# Patient Record
Sex: Male | Born: 1955
Health system: Southern US, Community
[De-identification: ages and names within clinical notes are randomized; demographics above are authoritative.]

## PROBLEM LIST (undated history)

## (undated) DIAGNOSIS — N400 Enlarged prostate without lower urinary tract symptoms: Secondary | ICD-10-CM

## (undated) DIAGNOSIS — Z9289 Personal history of other medical treatment: Secondary | ICD-10-CM

## (undated) DIAGNOSIS — K219 Gastro-esophageal reflux disease without esophagitis: Secondary | ICD-10-CM

## (undated) DIAGNOSIS — K760 Fatty (change of) liver, not elsewhere classified: Secondary | ICD-10-CM

## (undated) DIAGNOSIS — Z8719 Personal history of other diseases of the digestive system: Secondary | ICD-10-CM

## (undated) DIAGNOSIS — N201 Calculus of ureter: Secondary | ICD-10-CM

## (undated) DIAGNOSIS — K754 Autoimmune hepatitis: Secondary | ICD-10-CM

## (undated) DIAGNOSIS — D84821 Immunodeficiency due to drugs: Secondary | ICD-10-CM

## (undated) DIAGNOSIS — K31A0 Gastric intestinal metaplasia, unspecified: Secondary | ICD-10-CM

## (undated) DIAGNOSIS — D6869 Other thrombophilia: Secondary | ICD-10-CM

## (undated) DIAGNOSIS — I10 Essential (primary) hypertension: Secondary | ICD-10-CM

## (undated) DIAGNOSIS — G8194 Hemiplegia, unspecified affecting left nondominant side: Secondary | ICD-10-CM

## (undated) DIAGNOSIS — I509 Heart failure, unspecified: Secondary | ICD-10-CM

## (undated) DIAGNOSIS — G4730 Sleep apnea, unspecified: Secondary | ICD-10-CM

## (undated) DIAGNOSIS — K55069 Acute infarction of intestine, part and extent unspecified: Secondary | ICD-10-CM

## (undated) DIAGNOSIS — R42 Dizziness and giddiness: Secondary | ICD-10-CM

## (undated) DIAGNOSIS — N289 Disorder of kidney and ureter, unspecified: Secondary | ICD-10-CM

## (undated) DIAGNOSIS — N1831 Chronic kidney disease, stage 3a: Secondary | ICD-10-CM

## (undated) DIAGNOSIS — I5189 Other ill-defined heart diseases: Secondary | ICD-10-CM

## (undated) DIAGNOSIS — I5032 Chronic diastolic (congestive) heart failure: Secondary | ICD-10-CM

## (undated) DIAGNOSIS — I110 Hypertensive heart disease with heart failure: Secondary | ICD-10-CM

## (undated) DIAGNOSIS — I639 Cerebral infarction, unspecified: Secondary | ICD-10-CM

## (undated) DIAGNOSIS — Z7984 Long term (current) use of oral hypoglycemic drugs: Secondary | ICD-10-CM

## (undated) DIAGNOSIS — R7689 Other specified abnormal immunological findings in serum: Secondary | ICD-10-CM

## (undated) DIAGNOSIS — Z87442 Personal history of urinary calculi: Secondary | ICD-10-CM

## (undated) DIAGNOSIS — R55 Syncope and collapse: Secondary | ICD-10-CM

## (undated) DIAGNOSIS — E669 Obesity, unspecified: Secondary | ICD-10-CM

## (undated) DIAGNOSIS — R0602 Shortness of breath: Secondary | ICD-10-CM

## (undated) DIAGNOSIS — Z86718 Personal history of other venous thrombosis and embolism: Secondary | ICD-10-CM

## (undated) DIAGNOSIS — M1990 Unspecified osteoarthritis, unspecified site: Secondary | ICD-10-CM

## (undated) HISTORY — PX: LIVER BIOPSY: SHX301

## (undated) HISTORY — PX: LUMBAR FUSION: SHX111

## (undated) HISTORY — PX: OTHER SURGICAL HISTORY: SHX169

## (undated) HISTORY — DX: Benign prostatic hyperplasia without lower urinary tract symptoms: N40.0

## (undated) HISTORY — PX: APPENDECTOMY: SHX54

## (undated) HISTORY — PX: LUMBAR DISC SURGERY: SHX700

## (undated) HISTORY — DX: Gastro-esophageal reflux disease without esophagitis: K21.9

## (undated) HISTORY — DX: Essential (primary) hypertension: I10

---

## 2005-06-17 HISTORY — PX: CHOLECYSTECTOMY: SHX55

## 2005-06-25 ENCOUNTER — Ambulatory Visit: Payer: Self-pay | Admitting: Internal Medicine

## 2005-07-22 ENCOUNTER — Encounter (INDEPENDENT_AMBULATORY_CARE_PROVIDER_SITE_OTHER): Payer: Self-pay | Admitting: *Deleted

## 2005-07-22 ENCOUNTER — Ambulatory Visit: Payer: Self-pay | Admitting: Internal Medicine

## 2005-07-22 LAB — CONVERTED CEMR LAB
Blood Glucose, Fasting: 101 mg/dL
TSH: 2.89 microintl units/mL
WBC, blood: 4.1 10*3/uL

## 2005-07-24 ENCOUNTER — Ambulatory Visit: Payer: Self-pay | Admitting: Internal Medicine

## 2005-07-25 ENCOUNTER — Ambulatory Visit: Payer: Self-pay | Admitting: Internal Medicine

## 2005-07-31 ENCOUNTER — Ambulatory Visit: Payer: Self-pay | Admitting: Internal Medicine

## 2005-08-02 ENCOUNTER — Ambulatory Visit: Payer: Self-pay

## 2005-08-22 ENCOUNTER — Ambulatory Visit: Payer: Self-pay | Admitting: Internal Medicine

## 2005-09-29 ENCOUNTER — Emergency Department: Payer: Self-pay | Admitting: Emergency Medicine

## 2005-09-29 ENCOUNTER — Other Ambulatory Visit: Payer: Self-pay

## 2005-10-10 ENCOUNTER — Ambulatory Visit: Payer: Self-pay | Admitting: General Surgery

## 2005-10-11 ENCOUNTER — Emergency Department: Payer: Self-pay | Admitting: General Surgery

## 2006-03-17 ENCOUNTER — Ambulatory Visit: Payer: Self-pay | Admitting: Internal Medicine

## 2006-03-18 ENCOUNTER — Encounter (INDEPENDENT_AMBULATORY_CARE_PROVIDER_SITE_OTHER): Payer: Self-pay | Admitting: *Deleted

## 2006-04-09 ENCOUNTER — Ambulatory Visit: Payer: Self-pay | Admitting: Gastroenterology

## 2006-04-25 ENCOUNTER — Ambulatory Visit: Payer: Self-pay | Admitting: Gastroenterology

## 2006-06-04 ENCOUNTER — Ambulatory Visit: Payer: Self-pay | Admitting: Gastroenterology

## 2006-06-17 HISTORY — PX: HERNIA REPAIR: SHX51

## 2006-07-01 ENCOUNTER — Ambulatory Visit: Payer: Self-pay | Admitting: Urology

## 2006-09-01 ENCOUNTER — Inpatient Hospital Stay: Payer: Self-pay | Admitting: Internal Medicine

## 2006-09-01 ENCOUNTER — Other Ambulatory Visit: Payer: Self-pay

## 2006-09-11 ENCOUNTER — Ambulatory Visit: Payer: Self-pay | Admitting: Internal Medicine

## 2006-09-16 ENCOUNTER — Ambulatory Visit: Payer: Self-pay | Admitting: Internal Medicine

## 2006-10-16 ENCOUNTER — Ambulatory Visit: Payer: Self-pay | Admitting: Internal Medicine

## 2006-10-21 ENCOUNTER — Encounter: Payer: Self-pay | Admitting: Internal Medicine

## 2006-11-12 ENCOUNTER — Telehealth: Payer: Self-pay | Admitting: Internal Medicine

## 2006-11-12 ENCOUNTER — Encounter: Payer: Self-pay | Admitting: Internal Medicine

## 2006-11-13 ENCOUNTER — Encounter (INDEPENDENT_AMBULATORY_CARE_PROVIDER_SITE_OTHER): Payer: Self-pay | Admitting: *Deleted

## 2006-11-16 ENCOUNTER — Ambulatory Visit: Payer: Self-pay | Admitting: Internal Medicine

## 2006-11-24 ENCOUNTER — Encounter: Payer: Self-pay | Admitting: Internal Medicine

## 2006-12-16 ENCOUNTER — Ambulatory Visit: Payer: Self-pay | Admitting: Internal Medicine

## 2007-01-01 ENCOUNTER — Encounter: Payer: Self-pay | Admitting: Internal Medicine

## 2007-01-16 ENCOUNTER — Ambulatory Visit: Payer: Self-pay | Admitting: Internal Medicine

## 2007-01-21 ENCOUNTER — Inpatient Hospital Stay: Payer: Self-pay | Admitting: *Deleted

## 2007-01-21 ENCOUNTER — Other Ambulatory Visit: Payer: Self-pay

## 2007-02-03 ENCOUNTER — Ambulatory Visit: Payer: Self-pay | Admitting: Internal Medicine

## 2007-02-03 ENCOUNTER — Encounter: Payer: Self-pay | Admitting: Internal Medicine

## 2007-02-16 ENCOUNTER — Ambulatory Visit: Payer: Self-pay | Admitting: Internal Medicine

## 2007-03-18 ENCOUNTER — Ambulatory Visit: Payer: Self-pay | Admitting: Internal Medicine

## 2007-03-26 ENCOUNTER — Inpatient Hospital Stay: Payer: Self-pay | Admitting: Internal Medicine

## 2007-03-30 ENCOUNTER — Encounter: Payer: Self-pay | Admitting: Internal Medicine

## 2007-03-31 ENCOUNTER — Encounter: Payer: Self-pay | Admitting: Internal Medicine

## 2007-04-07 ENCOUNTER — Encounter (INDEPENDENT_AMBULATORY_CARE_PROVIDER_SITE_OTHER): Payer: Self-pay | Admitting: *Deleted

## 2007-04-10 ENCOUNTER — Encounter: Payer: Self-pay | Admitting: Internal Medicine

## 2007-04-18 ENCOUNTER — Ambulatory Visit: Payer: Self-pay | Admitting: Internal Medicine

## 2007-05-22 ENCOUNTER — Encounter: Payer: Self-pay | Admitting: *Deleted

## 2007-05-22 DIAGNOSIS — K7460 Unspecified cirrhosis of liver: Secondary | ICD-10-CM | POA: Insufficient documentation

## 2007-05-22 DIAGNOSIS — Z87442 Personal history of urinary calculi: Secondary | ICD-10-CM

## 2007-05-22 DIAGNOSIS — J309 Allergic rhinitis, unspecified: Secondary | ICD-10-CM | POA: Insufficient documentation

## 2007-05-22 DIAGNOSIS — K219 Gastro-esophageal reflux disease without esophagitis: Secondary | ICD-10-CM | POA: Insufficient documentation

## 2007-05-22 DIAGNOSIS — Z8719 Personal history of other diseases of the digestive system: Secondary | ICD-10-CM

## 2007-05-22 DIAGNOSIS — K449 Diaphragmatic hernia without obstruction or gangrene: Secondary | ICD-10-CM | POA: Insufficient documentation

## 2007-05-22 DIAGNOSIS — K5180 Other ulcerative colitis without complications: Secondary | ICD-10-CM

## 2007-05-29 ENCOUNTER — Emergency Department: Payer: Self-pay | Admitting: Internal Medicine

## 2007-06-02 ENCOUNTER — Telehealth: Payer: Self-pay | Admitting: Internal Medicine

## 2007-06-02 ENCOUNTER — Encounter: Payer: Self-pay | Admitting: Internal Medicine

## 2007-06-18 ENCOUNTER — Ambulatory Visit: Payer: Self-pay | Admitting: Internal Medicine

## 2007-06-18 DIAGNOSIS — Z944 Liver transplant status: Secondary | ICD-10-CM

## 2007-06-18 HISTORY — DX: Liver transplant status: Z94.4

## 2007-07-10 ENCOUNTER — Encounter: Payer: Self-pay | Admitting: Internal Medicine

## 2007-07-15 ENCOUNTER — Ambulatory Visit: Payer: Self-pay | Admitting: Internal Medicine

## 2007-07-19 ENCOUNTER — Ambulatory Visit: Payer: Self-pay | Admitting: Internal Medicine

## 2007-08-16 ENCOUNTER — Ambulatory Visit: Payer: Self-pay | Admitting: Internal Medicine

## 2007-08-25 ENCOUNTER — Ambulatory Visit: Payer: Self-pay | Admitting: Internal Medicine

## 2007-08-27 ENCOUNTER — Encounter: Payer: Self-pay | Admitting: Internal Medicine

## 2007-09-16 ENCOUNTER — Ambulatory Visit: Payer: Self-pay | Admitting: Internal Medicine

## 2007-11-10 ENCOUNTER — Ambulatory Visit: Payer: Self-pay | Admitting: Internal Medicine

## 2007-11-13 ENCOUNTER — Ambulatory Visit: Payer: Self-pay | Admitting: Internal Medicine

## 2007-11-19 ENCOUNTER — Encounter: Payer: Self-pay | Admitting: Internal Medicine

## 2007-11-24 HISTORY — PX: LIVER TRANSPLANT: SHX410

## 2008-01-16 ENCOUNTER — Ambulatory Visit: Payer: Self-pay | Admitting: Internal Medicine

## 2008-05-02 ENCOUNTER — Encounter: Payer: Self-pay | Admitting: Internal Medicine

## 2008-08-08 ENCOUNTER — Emergency Department (HOSPITAL_COMMUNITY): Admission: EM | Admit: 2008-08-08 | Discharge: 2008-08-08 | Payer: Self-pay | Admitting: Emergency Medicine

## 2008-08-16 ENCOUNTER — Emergency Department (HOSPITAL_COMMUNITY): Admission: EM | Admit: 2008-08-16 | Discharge: 2008-08-17 | Payer: Self-pay | Admitting: Emergency Medicine

## 2008-09-19 ENCOUNTER — Encounter: Payer: Self-pay | Admitting: Internal Medicine

## 2008-10-07 ENCOUNTER — Encounter: Payer: Self-pay | Admitting: Internal Medicine

## 2008-10-11 ENCOUNTER — Encounter
Admission: RE | Admit: 2008-10-11 | Discharge: 2008-10-11 | Payer: Self-pay | Admitting: Physical Medicine and Rehabilitation

## 2008-10-21 ENCOUNTER — Encounter: Payer: Self-pay | Admitting: Internal Medicine

## 2008-11-18 ENCOUNTER — Ambulatory Visit: Payer: Self-pay | Admitting: Family Medicine

## 2008-11-21 ENCOUNTER — Encounter: Payer: Self-pay | Admitting: Internal Medicine

## 2008-12-19 ENCOUNTER — Telehealth: Payer: Self-pay | Admitting: Family Medicine

## 2008-12-21 ENCOUNTER — Ambulatory Visit: Payer: Self-pay | Admitting: Internal Medicine

## 2008-12-21 DIAGNOSIS — M109 Gout, unspecified: Secondary | ICD-10-CM | POA: Insufficient documentation

## 2008-12-21 DIAGNOSIS — M1A9XX1 Chronic gout, unspecified, with tophus (tophi): Secondary | ICD-10-CM | POA: Insufficient documentation

## 2008-12-21 DIAGNOSIS — N400 Enlarged prostate without lower urinary tract symptoms: Secondary | ICD-10-CM | POA: Insufficient documentation

## 2008-12-22 LAB — CONVERTED CEMR LAB
Albumin: 4 g/dL (ref 3.5–5.2)
CO2: 31 meq/L (ref 19–32)
Calcium: 9.3 mg/dL (ref 8.4–10.5)
Creatinine, Ser: 1.3 mg/dL (ref 0.4–1.5)
Glucose, Bld: 105 mg/dL — ABNORMAL HIGH (ref 70–99)
Sodium: 143 meq/L (ref 135–145)

## 2008-12-26 ENCOUNTER — Encounter: Payer: Self-pay | Admitting: Internal Medicine

## 2009-02-13 ENCOUNTER — Encounter: Payer: Self-pay | Admitting: Internal Medicine

## 2009-03-14 ENCOUNTER — Encounter: Payer: Self-pay | Admitting: Internal Medicine

## 2009-11-20 ENCOUNTER — Encounter: Payer: Self-pay | Admitting: Internal Medicine

## 2010-02-12 ENCOUNTER — Ambulatory Visit: Payer: Self-pay | Admitting: Family Medicine

## 2010-03-30 ENCOUNTER — Ambulatory Visit: Payer: Self-pay | Admitting: Internal Medicine

## 2010-03-31 LAB — CONVERTED CEMR LAB
AST: 21 units/L (ref 0–37)
Alkaline Phosphatase: 60 units/L (ref 39–117)
BUN: 15 mg/dL (ref 6–23)
Basophils Relative: 0 % (ref 0–1)
Creatinine, Ser: 1.34 mg/dL (ref 0.40–1.50)
Eosinophils Absolute: 0.2 10*3/uL (ref 0.0–0.7)
Eosinophils Relative: 2 % (ref 0–5)
Glucose, Bld: 93 mg/dL (ref 70–99)
HCT: 50.4 % (ref 39.0–52.0)
Lymphs Abs: 1.8 10*3/uL (ref 0.7–4.0)
MCHC: 35.3 g/dL (ref 30.0–36.0)
MCV: 91.5 fL (ref 78.0–100.0)
Monocytes Relative: 14 % — ABNORMAL HIGH (ref 3–12)
Neutrophils Relative %: 54 % (ref 43–77)
RBC: 5.51 M/uL (ref 4.22–5.81)
Total Bilirubin: 2.3 mg/dL — ABNORMAL HIGH (ref 0.3–1.2)
WBC: 6.2 10*3/uL (ref 4.0–10.5)

## 2010-04-17 ENCOUNTER — Ambulatory Visit: Payer: Self-pay | Admitting: Internal Medicine

## 2010-05-21 ENCOUNTER — Encounter: Payer: Self-pay | Admitting: Internal Medicine

## 2010-05-25 ENCOUNTER — Ambulatory Visit: Payer: Self-pay | Admitting: Internal Medicine

## 2010-05-25 DIAGNOSIS — R350 Frequency of micturition: Secondary | ICD-10-CM

## 2010-05-25 LAB — CONVERTED CEMR LAB
Bilirubin Urine: NEGATIVE
Blood in Urine, dipstick: NEGATIVE
Ketones, urine, test strip: NEGATIVE
Nitrite: NEGATIVE
Rapid Strep: NEGATIVE
Urobilinogen, UA: 0.2

## 2010-07-17 NOTE — Assessment & Plan Note (Signed)
Summary: NOT FEELING WELL   Vital Signs:  Patient profile:   55 year old male Weight:      248 pounds O2 Sat:      96 % on Room air Temp:     98.1 degrees F oral Pulse rate:   76 / minute Pulse rhythm:   regular BP sitting:   140 / 90  (left arm) Cuff size:   large  Vitals Entered By: Mervin Hack CMA Duncan Dull) (March 30, 2010 3:35 PM)  O2 Flow:  Room air CC: abdominal pain   History of Present Illness: Gout attack did resolve not problems with the colcyrs  Has not been feeling right for several days had stomach virus at the time of his last appt  ~5-6 weeks ago Has some swelling in abdomen gets nausea and cold sweats at times (gets hot flash then) Does have feeling of pain in RUQ and LUQ reminds him of before his transplant  bloated feeling appetite is off tried laxative--worked with bowels but still has sensation More laxative   Allergies: 1)  ! Codeine 2)  ! Morphine 3)  ! Hydrocodone  Past History:  Past medical, surgical, family and social histories (including risk factors) reviewed for relevance to current acute and chronic problems.  Past Medical History: Reviewed history from 12/21/2008 and no changes required. Allergic rhinitis:(1991) GUYQ:(0347) QQVZ:(5638) Cirrhosis Benign prostatic hypertrophy  Past Surgical History: Reviewed history from 12/21/2008 and no changes required. SURGERY TO REMOVE FISTULA:(1984) DISC REMOVED LOWER VFIE:(3329) FUSION LOWER BACK PAST AA:(1988) HOSP. WITH INFECTION INSTESTINES:(2005) NEGATIVE ADENOSINE MYOVIEW EF 67%:(07/2005) LAB CHOLE. (DR. SANKAR):(09/2005) LIVER BIOPSY ERCP NL:(04/2006) R/H (DR.COPE):(06/2006) [facility] ELEVATED PAIN ,FEVERS X 2 WEEKS SIRS:(08/2005) BONE MARROW BX. REP?:(09/2006) LIVER TRANSPLANT--2009  Family History: Reviewed history from 05/22/2007 and no changes required. Father: ALIVE 18 YOA HISTORY OF COLON CANCER // 4 YEARS AGO Mother: ALIVE 69 YOA : RARE BLOOD DISEASE , DIALYSIS X 5  YEARS , FX. HIP GRANDPARENTS: GF 60'S BOTH : ONE WITH CANCER// THE OTHER MI MGM 80'S// GM/PGM 80'S--FELL BROKE HIP Siblings: 1 BROTHER 53YOA GOOD HEALTH// 1 BROTHER 56 YOA GOOD HEALTH EXCEPT FOR ARTHRITIS 1 SISTER 48 YOA GOOD HEALTH CV: + GRANDPARENTS HBP:+ MOTHER DM:+MOMS SISTER, DADS AUNT ARTHRITIS: PARENTS, G'PARENTS PROSTATECANCER : NEGATIVE BREAST/OVATIAN/UTERINECANCER: NEGATIVE COLON CANCER: +FATHER DEPRESSION: NEGATIVE ETOH/DRUG ABUSE: 1 AUNT/ 3 UNCLES DIED FROM ETOH( MOMS FAMILY) KIDNEY DISEASE: PARENTS DIALYSIS VASCULITIS: MOTHER  Social History: Reviewed history from 05/22/2007 and no changes required. Marital Status: Married LIVES WITH WIFE Children: 3 CHILDREN /5 GRANDCHILDREN Occupation: DISABLED  Review of Systems       No vomiting No cognitive problems No excessive bruising No cough but does get sense of SOB from abdomen pushing against the diaphragm  Physical Exam  General:  alert and normal appearance.   Neck:  supple, no masses, no thyromegaly, no carotid bruits, and no cervical lymphadenopathy.   Lungs:  normal respiratory effort, no intercostal retractions, no accessory muscle use, and normal breath sounds.   Heart:  normal rate, regular rhythm, no murmur, and no gallop.   Abdomen:  soft and normal bowel sounds.   Mild epigastric and RUQ tenderness No apparent ascites Extremities:  no edema Skin:  No petechiae or jaundice Psych:  normally interactive, good eye contact, not anxious appearing, and not depressed appearing.     Impression & Recommendations:  Problem # 1:  NAUSEA (ICD-787.02) Assessment New  has some tenderness over the area of his transplant liver no evidence of ascites  or other stigmata of liver failure clear mentation  P: will check labs    needs recheck at transplant center at [facility] as soon as possible     if liver tests elevated, will have him present to ER at [facility]  Orders: Venipuncture (54627) Specimen Handling  (99000) T-Comprehensive Metabolic Panel (03500-93818) T-CBC w/Diff (29937-16967)  Complete Medication List: 1)  Omeprazole 20 Mg Cpdr (Omeprazole) .Marland Kitchen.. 1 tab two times a day 2)  Flomax 0.4 Mg Xr24h-cap (Tamsulosin hcl) .... Once a day 3)  Colcrys 0.6 Mg Tabs (Colchicine) .... Take 1 by mouth once daily as needed for gout 4)  Aspirin 81 Mg Tabs (Aspirin) .... Take 1 by mouth once daily 5)  Prograf 1 Mg Caps (Tacrolimus) .... Take 1 by mouth three times a day  Patient Instructions: 1)  Please set up follow up at [facility] with your transplant team --should be by sometime next week  Current Allergies (reviewed today): ! CODEINE ! MORPHINE ! HYDROCODONE

## 2010-07-17 NOTE — Assessment & Plan Note (Signed)
Summary: FLU SHOT/DLO  Nurse Visit   Allergies: 1)  ! Codeine 2)  ! Morphine 3)  ! Hydrocodone  Orders Added: 1)  Flu Vaccine 66yrs + MEDICARE PATIENTS [Q2039] 2)  Administration Flu vaccine - MCR [G0008]  Flu Vaccine Consent Questions     Do you have a history of severe allergic reactions to this vaccine? no    Any prior history of allergic reactions to egg and/or gelatin? no    Do you have a sensitivity to the preservative Thimersol? no    Do you have a past history of Guillan-Barre Syndrome? no    Do you currently have an acute febrile illness? no    Have you ever had a severe reaction to latex? no    Vaccine information given and explained to patient? yes    Are you currently pregnant? no    Lot Number:[id number]   Exp Date:12/15/2010   Site Given  Left Deltoid IM1

## 2010-07-17 NOTE — Assessment & Plan Note (Signed)
Summary: ?GOUT/CLE   Vital Signs:  Patient profile:   55 year old male Height:      73.25 inches Weight:      245.25 pounds BMI:     32.25 Temp:     97.8 degrees F oral Pulse rate:   80 / minute Pulse rhythm:   regular BP sitting:   122 / 90  (left arm) Cuff size:   large  Vitals Entered By: Delilah Shan CMA  Dull) (February 12, 2010 2:00 PM) CC: ? gout.  Patient states he has been out of his Flomax and Omeprazole   History of Present Illness: H/o liver transplant and now with twice yearly eval at [facility].  Last check was in June 2011.   Gout- episode started late Thursday and early Friday.  L ankle and 1st MTP pain.  Has had some diarrhea in the meantime.  No fevers.  Minimal abdominal pain that had been episodic.  Not on colchine.  No new foods.  H/o gout intermittently for  ~10 years.  Off prednisone now.    Inc in heartburn after stopping PPI.  Allergies: 1)  ! Codeine 2)  ! Morphine 3)  ! Hydrocodone  Past History:  Past Medical History: Last updated: 12/21/2008 Allergic rhinitis:(1991) ZOXW:(9604) VWUJ:(8119) Cirrhosis Benign prostatic hypertrophy  Review of Systems       See HPI.  Otherwise negative.    Physical Exam  General:  GEN: nad, alert and oriented HEENT: mucous membranes moist NECK: supple w/o LA CV: rrr PULM: ctab, no inc wob ABD: soft, +bs, not tender to palpation  EXT: no edema SKIN: no acute rash but with erythema, edema and tenderness at L 1st MTP and medial mal.     Impression & Recommendations:  Problem # 1:  GOUT (ICD-274.9) I would restart colchicine and have patient follow up with primary re: longterm tx.  He can follow up with primary re:BP when he is not in pain.  He agrees.  I wouldn't start suppressive tx in the midst of a flare.  This was d/w patient.  I d/w patient JY:NWGNF to avoid.  I am unclear about the source of the diarrhea, but the patient has a benign exam.  He is not dehydrated by exam and he has no fever.  I would  observe this in the meantime. He may have a benign and self limitied viral process.  He is okay for outpatient follow up.  The following medications were removed from the medication list:    Colchicine 0.6 Mg Tabs (Colchicine) .Marland Kitchen... 1 tab by mouth daily or two times a day to prevent gout His updated medication list for this problem includes:    Colcrys 0.6 Mg Tabs (Colchicine) .Marland Kitchen... 1 by mouth qday as needed for gout  Complete Medication List: 1)  Omeprazole 20 Mg Cpdr (Omeprazole) .Marland Kitchen.. 1 tab two times a day 2)  Flomax 0.4 Mg Xr24h-cap (Tamsulosin hcl) .... Once a day 3)  Baby Aspirin 81 Mg Chew (Aspirin) .... Once a aday 4)  Prograft  5)  Colcrys 0.6 Mg Tabs (Colchicine) .Marland Kitchen.. 1 by mouth qday as needed for gout  Patient Instructions: 1)  Start back on the colchicine once daily and let us know if you have any fevers.  I would drink plenty of fluid in the meantime.  2)  I want you to follow up with Dr. Alphonsus Sias to talk about your gout and blood pressure.  Prescriptions: COLCRYS 0.6 MG TABS (COLCHICINE) 1 by mouth qday as  needed for gout  #30 x 3   Entered and Authorized by:   Crawford Givens MD   Signed by:   Crawford Givens MD on 02/12/2010   Method used:   Electronically to        [facility]* (retail)       [address]       [place], [place]  [address]       Ph: [phone]       Fax: [phone]   RxID:   [id number] FLOMAX 0.4 MG XR24H-CAP (TAMSULOSIN HCL) once a day  #30 x 12   Entered and Authorized by:   Crawford Givens MD   Signed by:   Crawford Givens MD on 02/12/2010   Method used:   Electronically to        [facility]* (retail)       [address]       [place], [place]  [address]       Ph: [phone]       Fax: [phone]   RxID:   [id number] OMEPRAZOLE 20 MG  CPDR (OMEPRAZOLE) 1 tab two times a day  #60 x 12   Entered and Authorized by:   Crawford Givens MD   Signed by:   Crawford Givens MD on 02/12/2010   Method used:   Electronically to        [facility]* (retail)       [address]       [place], [place]  [address]       Ph: [phone]       Fax: [phone]   RxID:   [id number]     Current Allergies (reviewed today): ! CODEINE ! MORPHINE ! HYDROCODONE

## 2010-07-17 NOTE — Letter (Signed)
Summary: [facility]  [facility]   Imported By: Maryln Gottron 12/08/2009 15:54:59  _____________________________________________________________________  External Attachment:    Type:   Image     Comment:   External Document  Appended Document: [facility] immunosupression fine on tacrolimus after liver transplant

## 2010-07-17 NOTE — Assessment & Plan Note (Signed)
Summary: THROAT,BODY ACHES/CLE   Vital Signs:  Patient profile:   55 year old male Weight:      249 pounds O2 Sat:      97 % on Room air Temp:     98.2 degrees F oral Pulse rate:   84 / minute Pulse rhythm:   regular Resp:     14 per minute BP sitting:   100 / 70  (left arm) Cuff size:   large  Vitals Entered By: Mervin Hack CMA (AAMA) (May 25, 2010 12:30 PM)  O2 Flow:  Room air CC: body aching, sinus, cough   History of Present Illness: Feeling fine till 3 days ago Started feeling weak Then throat dry and scratchy  esp since 2 days ago Feels "run down" no fever  Slight cough--mostly dry but occ dark phlegm Occ SOB since sick feels achy SLight soreness inside left ear a few days ago  Had his transplant follow up at [facility] on Monday--??some ill exposure then  Increased urinary freq slight dysuria No hematuria Slight LLQ abd soreness---relates to the cough  Allergies: 1)  ! Codeine 2)  ! Morphine 3)  ! Hydrocodone  Past History:  Past medical, surgical, family and social histories (including risk factors) reviewed for relevance to current acute and chronic problems.  Past Medical History: Reviewed history from 12/21/2008 and no changes required. Allergic rhinitis:(1991) VWUJ:(8119) JYNW:(2956) Cirrhosis Benign prostatic hypertrophy  Past Surgical History: Reviewed history from 12/21/2008 and no changes required. SURGERY TO REMOVE FISTULA:(1984) DISC REMOVED LOWER OZHY:(8657) FUSION LOWER BACK PAST AA:(1988) HOSP. WITH INFECTION INSTESTINES:(2005) NEGATIVE ADENOSINE MYOVIEW EF 67%:(07/2005) LAB CHOLE. (DR. SANKAR):(09/2005) LIVER BIOPSY ERCP NL:(04/2006) R/H (DR.COPE):(06/2006) [facility] ELEVATED PAIN ,FEVERS X 2 WEEKS SIRS:(08/2005) BONE MARROW BX. REP?:(09/2006) LIVER TRANSPLANT--2009  Family History: Reviewed history from 05/22/2007 and no changes required. Father: ALIVE 62 YOA HISTORY OF COLON CANCER // 4 YEARS AGO Mother: ALIVE 64 YOA :  RARE BLOOD DISEASE , DIALYSIS X 5 YEARS , FX. HIP GRANDPARENTS: GF 60'S BOTH : ONE WITH CANCER// THE OTHER MI MGM 80'S// GM/PGM 80'S--FELL BROKE HIP Siblings: 1 BROTHER 53YOA GOOD HEALTH// 1 BROTHER 56 YOA GOOD HEALTH EXCEPT FOR ARTHRITIS 1 SISTER 48 YOA GOOD HEALTH CV: + GRANDPARENTS HBP:+ MOTHER DM:+MOMS SISTER, DADS AUNT ARTHRITIS: PARENTS, G'PARENTS PROSTATECANCER : NEGATIVE BREAST/OVATIAN/UTERINECANCER: NEGATIVE COLON CANCER: +FATHER DEPRESSION: NEGATIVE ETOH/DRUG ABUSE: 1 AUNT/ 3 UNCLES DIED FROM ETOH( MOMS FAMILY) KIDNEY DISEASE: PARENTS DIALYSIS VASCULITIS: MOTHER  Social History: Reviewed history from 05/22/2007 and no changes required. Marital Status: Married LIVES WITH WIFE Children: 3 CHILDREN /5 GRANDCHILDREN Occupation: DISABLED  Review of Systems       No vomiting ONe loose stool yesterday (but no increase in numbers)   Physical Exam  General:  alert.  NAD Head:  no sinus tenderness Ears:  R ear normal and L ear normal.   Nose:  mild nasal congestion Mouth:  slight pharyngeal injection without exudates no other lesions Neck:  supple, no masses, and no cervical lymphadenopathy.   Lungs:  normal respiratory effort, no intercostal retractions, no accessory muscle use, normal breath sounds, no crackles, and no wheezes.   Abdomen:  soft, non-tender, and no masses.     Impression & Recommendations:  Problem # 1:  PHARYNGITIS-ACUTE (ICD-462) Assessment New  seems to be viral is on immunosuppressants, but no evidence bacterial infection discussed supportive care with tylenol recheck if persists or worsens  His updated medication list for this problem includes:    Aspirin 81 Mg Tabs (  Aspirin) .Marland Kitchen... Take 1 by mouth once daily  Orders: Rapid Strep (29528)  Problem # 2:  FREQUENCY, URINARY (ICD-788.41) Assessment: New  no evidence of UTI observe only  His updated medication list for this problem includes:    Flomax 0.4 Mg Xr24h-cap (Tamsulosin hcl)  ..... Once a day  Orders: UA Dipstick w/o Micro (manual) (41324)  Complete Medication List: 1)  Omeprazole 20 Mg Cpdr (Omeprazole) .Marland Kitchen.. 1 tab two times a day 2)  Flomax 0.4 Mg Xr24h-cap (Tamsulosin hcl) .... Once a day 3)  Colcrys 0.6 Mg Tabs (Colchicine) .... Take 1 by mouth once daily as needed for gout 4)  Aspirin 81 Mg Tabs (Aspirin) .... Take 1 by mouth once daily 5)  Prograf 1 Mg Caps (Tacrolimus) .... Take 1 by mouth three times a day  Patient Instructions: 1)  Please schedule a follow-up appointment as needed .  Call if you worsen or it persists for over 10-14 days   Orders Added: 1)  Est. Patient Level IV [40102] 2)  UA Dipstick w/o Micro (manual) [81002] 3)  Rapid Strep [72536]    Current Allergies (reviewed today): ! CODEINE ! MORPHINE ! HYDROCODONE  Laboratory Results   Urine Tests  Date/Time Received: May 25, 2010 1:04 PM   Routine Urinalysis   Color: yellow Appearance: Clear Glucose: negative   (Normal Range: Negative) Bilirubin: negative   (Normal Range: Negative) Ketone: negative   (Normal Range: Negative) Spec. Gravity: 1.020   (Normal Range: 1.003-1.035) Blood: negative   (Normal Range: Negative) pH: 6.0   (Normal Range: 5.0-8.0) Protein: trace   (Normal Range: Negative) Urobilinogen: 0.2   (Normal Range: 0-1) Nitrite: negative   (Normal Range: Negative) Leukocyte Esterace: negative   (Normal Range: Negative)    Date/Time Received: May 25, 2010 1:05 PM   Other Tests  Rapid Strep: negative

## 2010-07-19 NOTE — Letter (Signed)
Summary: [facility]  [facility]   Imported By: Lanelle Bal 06/06/2010 08:23:08  _____________________________________________________________________  External Attachment:    Type:   Image     Comment:   External Document  Appended Document: [facility] still doing well after liver transplant

## 2010-09-20 ENCOUNTER — Encounter: Payer: Self-pay | Admitting: *Deleted

## 2010-09-24 ENCOUNTER — Ambulatory Visit (INDEPENDENT_AMBULATORY_CARE_PROVIDER_SITE_OTHER): Payer: Medicare Other | Admitting: Family Medicine

## 2010-09-24 ENCOUNTER — Encounter: Payer: Self-pay | Admitting: Family Medicine

## 2010-09-24 VITALS — BP 130/84 | HR 76 | Temp 98.0°F | Ht 72.0 in | Wt 247.0 lb

## 2010-09-24 DIAGNOSIS — K7210 Chronic hepatic failure without coma: Secondary | ICD-10-CM | POA: Insufficient documentation

## 2010-09-24 DIAGNOSIS — M109 Gout, unspecified: Secondary | ICD-10-CM

## 2010-09-24 DIAGNOSIS — K769 Liver disease, unspecified: Secondary | ICD-10-CM

## 2010-09-24 MED ORDER — PREDNISONE 10 MG PO TABS: ORAL_TABLET | ORAL | Status: DC

## 2010-09-24 NOTE — Patient Instructions (Signed)
Colcrys (colchicine) pills: 1 tablet every hour for 2 hours, then 1 tablet by mouth every 2 hours for the rest of the day today. THEN TAKE 1 TABLET EVERY DAY FROM NOW On

## 2010-09-24 NOTE — Progress Notes (Signed)
55 year old male:  Gout off and on for years. (10 years)  Pleasant male with a history of liver transplant done in 2009 at [facility] status post incomplete rejection, currently on Prograf, and followed by the [facility] transplant service.  He is here with left-sided first second and third MTP pain. He has had some swelling and some occasional warmth in these joints. He has had a ten-year history of gout. Currently he is taking colchicine, but is not been completely compliant with his therapy. He has been taking it more or less for the last 2 months without symptomatic relief of his symptoms.  Distantly, he was on allopurinol, but upon further review, allopurinol is eliminated in the liver.  The PMH, PSH, Social History, Family History, Medications, and allergies have been reviewed in [facility], and have been updated if relevant.  REVIEW OF SYSTEMS  GEN: No fevers, chills. Nontoxic. Primarily MSK c/o today. MSK: Detailed in the HPI GI: tolerating PO intake without difficulty Neuro: No numbness, parasthesias, or tingling associated. Otherwise the pertinent positives of the ROS are noted above.   GEN: Well-developed,well-nourished,in no acute distress; alert,appropriate and cooperative throughout examination HEENT: Normocephalic and atraumatic without obvious abnormalities. Ears, externally no deformities PULM: Breathing comfortably in no respiratory distress EXT: No clubbing, cyanosis, or edema PSYCH: Normally interactive. Cooperative during the interview. Pleasant. Friendly and conversant. Not anxious or depressed appearing. Normal, full affect.  FEET: L Echymosis: no Edema: no MT pain: mild swelling, synovitis: 1-3 on L Callus pattern: none Lateral Mall: NT Medial Mall: NT Talus: NT Navicular: NT Cuboid: NT Calcaneous: NT Metatarsals: NT 5th MT: NT Phalanges: NT Achilles: NT Plantar Fascia: NT Fat Pad: NT Peroneals: NT Post Tib: NT Great Toe: Nml motion Ant Drawer: neg ATFL: NT CFL:  NT Deltoid: NT Sensation: intact  A/P: Gout: pulse colchicine and low dose steroids. Risk benefits considered of allopurinol and discussed with Dr. Alphonsus Sias (PCP) also - and risk seems greater than potential benefit. C/w colcrys bid when gout flare resolves 2. H/o Liver failure, chart reviewed, notes reviewed.

## 2010-09-26 ENCOUNTER — Emergency Department (HOSPITAL_COMMUNITY): Payer: Medicare Other

## 2010-09-26 ENCOUNTER — Emergency Department (HOSPITAL_COMMUNITY)
Admission: EM | Admit: 2010-09-26 | Discharge: 2010-09-27 | Disposition: A | Discharge status: Home / Self Care | Payer: Medicare Other | Attending: Emergency Medicine | Admitting: Emergency Medicine

## 2010-09-26 ENCOUNTER — Other Ambulatory Visit (HOSPITAL_COMMUNITY): Payer: Medicare Other

## 2010-09-26 DIAGNOSIS — Z944 Liver transplant status: Secondary | ICD-10-CM | POA: Insufficient documentation

## 2010-09-26 DIAGNOSIS — R161 Splenomegaly, not elsewhere classified: Secondary | ICD-10-CM | POA: Insufficient documentation

## 2010-09-26 DIAGNOSIS — Z9089 Acquired absence of other organs: Secondary | ICD-10-CM | POA: Insufficient documentation

## 2010-09-26 DIAGNOSIS — R197 Diarrhea, unspecified: Secondary | ICD-10-CM | POA: Insufficient documentation

## 2010-09-26 DIAGNOSIS — K219 Gastro-esophageal reflux disease without esophagitis: Secondary | ICD-10-CM | POA: Insufficient documentation

## 2010-09-26 DIAGNOSIS — I10 Essential (primary) hypertension: Secondary | ICD-10-CM | POA: Insufficient documentation

## 2010-09-26 DIAGNOSIS — R1013 Epigastric pain: Secondary | ICD-10-CM | POA: Insufficient documentation

## 2010-09-26 DIAGNOSIS — N200 Calculus of kidney: Secondary | ICD-10-CM | POA: Insufficient documentation

## 2010-09-26 DIAGNOSIS — T887XXA Unspecified adverse effect of drug or medicament, initial encounter: Secondary | ICD-10-CM | POA: Insufficient documentation

## 2010-09-26 LAB — COMPREHENSIVE METABOLIC PANEL
AST: 37 U/L (ref 0–37)
Albumin: 4.5 g/dL (ref 3.5–5.2)
CO2: 21 mEq/L (ref 19–32)
Calcium: 10.2 mg/dL (ref 8.4–10.5)
Creatinine, Ser: 1.6 mg/dL — ABNORMAL HIGH (ref 0.4–1.5)
GFR calc Af Amer: 55 mL/min — ABNORMAL LOW (ref >60)
GFR calc non Af Amer: 46 mL/min — ABNORMAL LOW (ref >60)

## 2010-09-26 LAB — CBC
Hemoglobin: 19.1 g/dL — ABNORMAL HIGH (ref 13.0–17.0)
MCV: 89.1 fL (ref 78.0–100.0)
Platelets: 141 10*3/uL — ABNORMAL LOW (ref 150–400)
RBC: 5.94 MIL/uL — ABNORMAL HIGH (ref 4.22–5.81)
WBC: 11.4 10*3/uL — ABNORMAL HIGH (ref 4.0–10.5)

## 2010-09-26 LAB — DIFFERENTIAL
Eosinophils Absolute: 0 10*3/uL (ref 0.0–0.7)
Lymphocytes Relative: 12 % (ref 12–46)
Lymphs Abs: 1.3 10*3/uL (ref 0.7–4.0)
Neutro Abs: 8.7 10*3/uL — ABNORMAL HIGH (ref 1.7–7.7)
Neutrophils Relative %: 76 % (ref 43–77)

## 2010-09-26 LAB — CK TOTAL AND CKMB (NOT AT ARMC)
Relative Index: INVALID (ref 0.0–2.5)
Total CK: 66 U/L (ref 7–232)

## 2010-09-26 LAB — APTT: aPTT: 31 seconds (ref 24–37)

## 2010-09-27 ENCOUNTER — Encounter (HOSPITAL_COMMUNITY): Payer: Self-pay

## 2010-09-27 ENCOUNTER — Emergency Department (HOSPITAL_COMMUNITY): Payer: Medicare Other

## 2010-09-27 LAB — DIFFERENTIAL
Basophils Absolute: 0 10*3/uL (ref 0.0–0.1)
Basophils Relative: 0 % (ref 0–1)
Eosinophils Absolute: 0.1 10*3/uL (ref 0.0–0.7)
Eosinophils Relative: 2 % (ref 0–5)
Lymphocytes Relative: 17 % (ref 12–46)

## 2010-09-27 LAB — URINALYSIS, ROUTINE W REFLEX MICROSCOPIC
Bilirubin Urine: NEGATIVE
Glucose, UA: NEGATIVE mg/dL
Hgb urine dipstick: NEGATIVE
Ketones, ur: NEGATIVE mg/dL
pH: 8 (ref 5.0–8.0)

## 2010-09-27 LAB — COMPREHENSIVE METABOLIC PANEL
ALT: 20 U/L (ref 0–53)
AST: 20 U/L (ref 0–37)
Alkaline Phosphatase: 57 U/L (ref 39–117)
CO2: 25 mEq/L (ref 19–32)
Chloride: 103 mEq/L (ref 96–112)
Creatinine, Ser: 1.3 mg/dL (ref 0.4–1.5)
GFR calc Af Amer: >60 mL/min (ref >60)
GFR calc non Af Amer: 58 mL/min — ABNORMAL LOW (ref >60)
Total Bilirubin: 2.1 mg/dL — ABNORMAL HIGH (ref 0.3–1.2)

## 2010-09-27 LAB — LIPASE, BLOOD: Lipase: 16 U/L (ref 11–59)

## 2010-09-27 LAB — CBC
MCV: 92.9 fL (ref 78.0–100.0)
RBC: 5.03 MIL/uL (ref 4.22–5.81)
WBC: 8.3 10*3/uL (ref 4.0–10.5)

## 2010-09-27 MED ORDER — IOHEXOL 300 MG/ML  SOLN
100.0000 mL | Freq: Once | INTRAMUSCULAR | Status: AC | PRN
  Administered 2010-09-27: 100 mL via INTRAVENOUS

## 2010-10-02 LAB — DIFFERENTIAL
Basophils Relative: 1 % (ref 0–1)
Eosinophils Absolute: 0.1 10*3/uL (ref 0.0–0.7)
Monocytes Relative: 8 % (ref 3–12)
Neutro Abs: 2.8 10*3/uL (ref 1.7–7.7)
Neutrophils Relative %: 61 % (ref 43–77)

## 2010-10-02 LAB — COMPREHENSIVE METABOLIC PANEL
Alkaline Phosphatase: 51 U/L (ref 39–117)
BUN: 11 mg/dL (ref 6–23)
CO2: 26 mEq/L (ref 19–32)
Calcium: 9.2 mg/dL (ref 8.4–10.5)
GFR calc non Af Amer: >60 mL/min (ref >60)
Glucose, Bld: 94 mg/dL (ref 70–99)
Total Protein: 6.5 g/dL (ref 6.0–8.3)

## 2010-10-02 LAB — CBC
HCT: 46 % (ref 39.0–52.0)
Hemoglobin: 16.3 g/dL (ref 13.0–17.0)
MCHC: 35.3 g/dL (ref 30.0–36.0)
RBC: 4.99 MIL/uL (ref 4.22–5.81)
RDW: 15.3 % (ref 11.5–15.5)

## 2011-04-15 ENCOUNTER — Ambulatory Visit (INDEPENDENT_AMBULATORY_CARE_PROVIDER_SITE_OTHER): Payer: Medicare Other | Admitting: Family Medicine

## 2011-04-15 ENCOUNTER — Encounter: Payer: Self-pay | Admitting: Family Medicine

## 2011-04-15 VITALS — BP 148/90 | HR 83 | Temp 97.7°F | Wt 267.1 lb

## 2011-04-15 DIAGNOSIS — M542 Cervicalgia: Secondary | ICD-10-CM

## 2011-04-15 DIAGNOSIS — Z23 Encounter for immunization: Secondary | ICD-10-CM

## 2011-04-15 DIAGNOSIS — M109 Gout, unspecified: Secondary | ICD-10-CM

## 2011-04-15 MED ORDER — TAMSULOSIN HCL 0.4 MG PO CAPS: 0.4000 mg | ORAL_CAPSULE | Freq: Every day | ORAL | Status: DC

## 2011-04-15 MED ORDER — OMEPRAZOLE 20 MG PO CPDR: 20.0000 mg | DELAYED_RELEASE_CAPSULE | Freq: Two times a day (BID) | ORAL | Status: DC

## 2011-04-15 MED ORDER — COLCHICINE 0.6 MG PO TABS: 0.6000 mg | ORAL_TABLET | Freq: Every day | ORAL | Status: DC | PRN

## 2011-04-15 NOTE — Patient Instructions (Addendum)
Schedule a physical with Dr. Alphonsus Sias.  Restart your meds in the meantime.  I would take the colchicine as needed for now, but talk with Dr. Alphonsus Sias about the long-term plan.  I would change your pillow and see if that helps with the neck symptoms.  Take care.

## 2011-04-15 NOTE — Progress Notes (Signed)
Gout.  Had run out of meds.  Had a flare this summer and again this fall. Usually in R ankle and then 1st toe.  This flare started Friday.  He's been working on low urate diet.    Occ occipital HA and neck pain at night.  Has B tinnitus, constant. No dx of migraines. On prograf.  No FCNAV. Recently changed pillows and had gotten worse in the meantime.    Meds, vitals, and allergies reviewed.   ROS: See HPI.  Otherwise, noncontributory.  nad ncat Neck supple but with some crepitus and pain with rom, esp L lat deviation, no la rrr ctab R 1st MTP red and tender, c/w prev gout flares per patient

## 2011-04-16 DIAGNOSIS — M542 Cervicalgia: Secondary | ICD-10-CM | POA: Insufficient documentation

## 2011-04-16 NOTE — Assessment & Plan Note (Signed)
Restart prn colchicine and f/u with PMD.

## 2011-04-16 NOTE — Assessment & Plan Note (Signed)
Unlikely to be related to tinnitus, gout.  Normal motor/sensation in arms.  Likely flare of OA, potentially worse with recent pillow change.  Would change pillows first and then f/u with PMD if persistent.  Not a stiff neck.

## 2011-04-18 ENCOUNTER — Emergency Department (HOSPITAL_COMMUNITY)
Admission: EM | Admit: 2011-04-18 | Discharge: 2011-04-18 | Disposition: A | Discharge status: Home / Self Care | Payer: Medicare Other | Attending: Emergency Medicine | Admitting: Emergency Medicine

## 2011-04-18 ENCOUNTER — Telehealth: Payer: Self-pay

## 2011-04-18 DIAGNOSIS — Z944 Liver transplant status: Secondary | ICD-10-CM | POA: Insufficient documentation

## 2011-04-18 DIAGNOSIS — M7989 Other specified soft tissue disorders: Secondary | ICD-10-CM | POA: Insufficient documentation

## 2011-04-18 DIAGNOSIS — M109 Gout, unspecified: Secondary | ICD-10-CM | POA: Insufficient documentation

## 2011-04-18 MED ORDER — TRAMADOL HCL 50 MG PO TABS: 50.0000 mg | ORAL_TABLET | Freq: Three times a day (TID) | ORAL | Status: DC | PRN

## 2011-04-18 NOTE — Telephone Encounter (Signed)
Patient advised.

## 2011-04-18 NOTE — Telephone Encounter (Signed)
I wouldn't use nsaids/tylenol given his liver/med history.  I would take tramadol as needed for as short a period of time as possible.  Thanks.  Continue colchicine and f/u with PMD.

## 2011-04-18 NOTE — Telephone Encounter (Signed)
Pt saw Dr Para March on 04/15/11 and gout med given is not helping the pain. Right foot swelling is no better. Pt wants different med to take care of swelling and pain. Pt uses [facility] on [address] in [place]. Pt can be reached [phone].

## 2011-04-26 ENCOUNTER — Ambulatory Visit (INDEPENDENT_AMBULATORY_CARE_PROVIDER_SITE_OTHER): Payer: Medicare Other | Admitting: Internal Medicine

## 2011-04-26 ENCOUNTER — Encounter: Payer: Self-pay | Admitting: Internal Medicine

## 2011-04-26 VITALS — BP 118/88 | HR 79 | Temp 98.5°F | Ht 72.0 in | Wt 254.0 lb

## 2011-04-26 DIAGNOSIS — N400 Enlarged prostate without lower urinary tract symptoms: Secondary | ICD-10-CM

## 2011-04-26 DIAGNOSIS — K219 Gastro-esophageal reflux disease without esophagitis: Secondary | ICD-10-CM

## 2011-04-26 DIAGNOSIS — Z0000 Encounter for general adult medical examination without abnormal findings: Secondary | ICD-10-CM | POA: Insufficient documentation

## 2011-04-26 DIAGNOSIS — Z125 Encounter for screening for malignant neoplasm of prostate: Secondary | ICD-10-CM

## 2011-04-26 DIAGNOSIS — K7210 Chronic hepatic failure without coma: Secondary | ICD-10-CM

## 2011-04-26 DIAGNOSIS — M109 Gout, unspecified: Secondary | ICD-10-CM

## 2011-04-26 DIAGNOSIS — K769 Liver disease, unspecified: Secondary | ICD-10-CM

## 2011-04-26 LAB — PSA: PSA: 0.73 ng/mL (ref 0.10–4.00)

## 2011-04-26 MED ORDER — OMEPRAZOLE 20 MG PO CPDR: 20.0000 mg | DELAYED_RELEASE_CAPSULE | Freq: Two times a day (BID) | ORAL | Status: DC

## 2011-04-26 MED ORDER — COLCHICINE 0.6 MG PO TABS: 0.6000 mg | ORAL_TABLET | Freq: Two times a day (BID) | ORAL | Status: DC

## 2011-04-26 NOTE — Patient Instructions (Signed)
Please get copy of colonoscopy from [facility] ~5 years ago

## 2011-04-26 NOTE — Assessment & Plan Note (Signed)
Continues follow up at [facility] transplant center

## 2011-04-26 NOTE — Progress Notes (Signed)
Subjective:    Patient ID: Devin White, male    DOB: 1956-06-11, 55 y.o.   MRN: [id number]  HPI Doing well No problems with liver transplant now Having 6 month recalls only Mild changes in prograf at times  Dad had prostate cancer--he wants to screen  Has had some gout flareups Colchicine up to tid is effective but it tears up his stomach Has tried cherry juice and it may be helping  Heartburn controlled by prilosec Failed attempt off and even affected his swallowing  Voids okay on tamsulosin  Current Outpatient Prescriptions on File Prior to Visit  Medication Sig Dispense Refill  . aspirin 81 MG tablet Take 81 mg by mouth daily.        . colchicine 0.6 MG tablet Take 1 tablet (0.6 mg total) by mouth daily as needed.  30 tablet  0  . omeprazole (PRILOSEC) 20 MG capsule Take 1 capsule (20 mg total) by mouth 2 (two) times daily.  60 capsule  0  . tacrolimus (PROGRAF) 1 MG capsule Take 1 mg by mouth 2 (two) times daily.       . Tamsulosin HCl (FLOMAX) 0.4 MG CAPS Take 1 capsule (0.4 mg total) by mouth daily.  30 capsule  0  . traMADol (ULTRAM) 50 MG tablet Take 1 tablet (50 mg total) by mouth every 8 (eight) hours as needed for pain (sedation caution).  20 tablet  0    Allergies  Allergen Reactions  . Codeine     REACTION: ERRATIC BEHAVIOR  . Hydrocodone     REACTION: ERRATIC BEHAVIOR  . Morphine     REACTION: ERRATIC  BEHAVIOR    Past Medical History  Diagnosis Date  . Allergy   . GERD (gastroesophageal reflux disease)   . Gout   . Cirrhosis   . Benign prostatic hypertrophy   . Liver failure     s/p transplant    Past Surgical History  Procedure Date  . Removal of fistula   . Lumbar disc surgery   . Lumbar fusion   . Liver biopsy   . Liver transplant   . Bone morrow biopsy     Family History  Problem Relation Age of Onset  . Cancer Mother     colon  . Arthritis Mother   . Vasculitis Mother   . Hypertension Mother   . Cancer Father     colon  .  Kidney disease Father   . Arthritis Father   . Alcohol abuse Maternal Aunt   . Diabetes Maternal Aunt   . Alcohol abuse Maternal Uncle   . Diabetes Paternal Aunt   . Alcohol abuse Maternal Uncle   . Alcohol abuse Maternal Uncle   . Arthritis Brother     History   Social History  . Marital Status: Divorced    Spouse Name: N/A    Number of Children: 3  . Years of Education: N/A   Occupational History  . disabled    Social History Main Topics  . Smoking status: Never Smoker   . Smokeless tobacco: Never Used  . Alcohol Use: Not on file  . Drug Use: Not on file  . Sexually Active: Not on file   Other Topics Concern  . Not on file   Social History Narrative  . No narrative on file   Review of Systems  Constitutional: Positive for diaphoresis. Negative for appetite change and unexpected weight change.       Wears seat belt Gets  night sweats at times  HENT: Positive for hearing loss and tinnitus. Negative for congestion, rhinorrhea and dental problem.        Freq dry mouth  Eyes: Negative for visual disturbance.       No diplopia or unilateral vision loss  Respiratory: Negative for cough, chest tightness and shortness of breath.   Cardiovascular: Negative for chest pain, palpitations and leg swelling.  Gastrointestinal: Positive for nausea and constipation. Negative for vomiting, abdominal pain and blood in stool.       Diarrhea with colchicine Heartburn okay with med  Genitourinary: Negative for dysuria, frequency and difficulty urinating.       No sexual problems  Musculoskeletal: Positive for back pain, joint swelling and arthralgias.       Gout problems in foot occ knee pain and swelling Chronic back problems---doing better in general  Skin: Negative for color change and rash.  Neurological: Positive for numbness and headaches. Negative for dizziness, syncope, weakness and light-headedness.       Tingling or numbness in hands at times  Hematological: Negative for  adenopathy. Does not bruise/bleed easily.  Psychiatric/Behavioral: Positive for sleep disturbance. Negative for dysphoric mood. The patient is not nervous/anxious.        Only sleeps a few hours at a time        Objective:   Physical Exam  Constitutional: He is oriented to person, place, and time. He appears well-developed and well-nourished. No distress.  HENT:  Head: Normocephalic and atraumatic.  Right Ear: External ear normal.  Left Ear: External ear normal.  Mouth/Throat: Oropharynx is clear and moist. No oropharyngeal exudate.       TMs fine  Eyes: Conjunctivae and EOM are normal. Pupils are equal, round, and reactive to light.  Neck: Normal range of motion. Neck supple. No thyromegaly present.  Cardiovascular: Normal rate, regular rhythm, normal heart sounds and intact distal pulses.  Exam reveals no gallop.   No murmur heard. Pulmonary/Chest: Effort normal and breath sounds normal. No respiratory distress. He has no wheezes. He has no rales.  Abdominal: Soft. There is no tenderness.  Musculoskeletal: Normal range of motion. He exhibits no edema and no tenderness.  Lymphadenopathy:    He has no cervical adenopathy.  Neurological: He is alert and oriented to person, place, and time.  Skin: No rash noted. No erythema.  Psychiatric: He has a normal mood and affect. His behavior is normal. Judgment and thought content normal.          Assessment & Plan:

## 2011-04-26 NOTE — Assessment & Plan Note (Signed)
Voids okay with the med 

## 2011-04-26 NOTE — Assessment & Plan Note (Signed)
needs to continue the PPI

## 2011-04-26 NOTE — Assessment & Plan Note (Signed)
Intermittent exacerbations Will continue colchicine as preventative

## 2011-04-26 NOTE — Assessment & Plan Note (Signed)
Doing well Will check PSA after discussion Will get records of last colonoscopy---probably not due for several years

## 2011-06-03 ENCOUNTER — Other Ambulatory Visit: Payer: Self-pay | Admitting: *Deleted

## 2011-06-03 MED ORDER — TAMSULOSIN HCL 0.4 MG PO CAPS: 0.4000 mg | ORAL_CAPSULE | Freq: Every day | ORAL | Status: DC

## 2011-06-03 NOTE — Telephone Encounter (Signed)
rx sent to pharmacy by e-script  

## 2011-06-03 NOTE — Telephone Encounter (Signed)
Patient was not given RF's at last request.  Is it okay to RF?

## 2011-07-19 ENCOUNTER — Emergency Department: Payer: Self-pay | Admitting: Unknown Physician Specialty

## 2011-07-19 LAB — COMPREHENSIVE METABOLIC PANEL
Anion Gap: 11 (ref 7–16)
Bilirubin,Total: 1.1 mg/dL — ABNORMAL HIGH (ref 0.2–1.0)
Calcium, Total: 8.5 mg/dL (ref 8.5–10.1)
Chloride: 107 mmol/L (ref 98–107)
Co2: 26 mmol/L (ref 21–32)
Creatinine: 1.28 mg/dL (ref 0.60–1.30)
EGFR (Non-African Amer.): >60
Osmolality: 287 (ref 275–301)
SGPT (ALT): 27 U/L
Sodium: 144 mmol/L (ref 136–145)
Total Protein: 6.5 g/dL (ref 6.4–8.2)

## 2011-07-19 LAB — CBC
HCT: 48.5 % (ref 40.0–52.0)
MCH: 32.5 pg (ref 26.0–34.0)
MCV: 95 fL (ref 80–100)
Platelet: 111 10*3/uL — ABNORMAL LOW (ref 150–440)
WBC: 6.4 10*3/uL (ref 3.8–10.6)

## 2011-07-20 LAB — MAGNESIUM: Magnesium: 1.6 mg/dL — ABNORMAL LOW

## 2011-07-20 LAB — PROTIME-INR
INR: 1
Prothrombin Time: 13.2 secs (ref 11.5–14.7)

## 2011-09-05 ENCOUNTER — Ambulatory Visit (INDEPENDENT_AMBULATORY_CARE_PROVIDER_SITE_OTHER): Payer: Medicare Other | Admitting: Internal Medicine

## 2011-09-05 ENCOUNTER — Encounter: Payer: Self-pay | Admitting: Internal Medicine

## 2011-09-05 VITALS — BP 140/100 | HR 91 | Temp 98.0°F | Ht 72.0 in | Wt 265.0 lb

## 2011-09-05 DIAGNOSIS — K769 Liver disease, unspecified: Secondary | ICD-10-CM

## 2011-09-05 DIAGNOSIS — K7210 Chronic hepatic failure without coma: Secondary | ICD-10-CM

## 2011-09-05 DIAGNOSIS — I10 Essential (primary) hypertension: Secondary | ICD-10-CM

## 2011-09-05 MED ORDER — METOPROLOL SUCCINATE ER 25 MG PO TB24: 25.0000 mg | ORAL_TABLET | Freq: Every day | ORAL | Status: DC

## 2011-09-05 NOTE — Progress Notes (Signed)
Subjective:    Patient ID: Devin White, male    DOB: 1955-09-13, 56 y.o.   MRN: [id number]  HPI Was seen in ER 2/1 Went due to drawing sensation in left hand No chest pain, headache, etc BP was very high 210/116 Got BP meds, etc and went down to 183/112  Has noted hand hurting and contracting when BP has been up Got clonidine Rx but ran out Just using intermittently--last a few days ago BP has been normal at 140/80 at times  Current Outpatient Prescriptions on File Prior to Visit  Medication Sig Dispense Refill  . aspirin 81 MG tablet Take 81 mg by mouth daily.        . colchicine 0.6 MG tablet Take 1 tablet (0.6 mg total) by mouth 2 (two) times daily.  60 tablet  11  . omeprazole (PRILOSEC) 20 MG capsule Take 1 capsule (20 mg total) by mouth 2 (two) times daily.  60 capsule  11  . tacrolimus (PROGRAF) 1 MG capsule Take 1 mg by mouth 2 (two) times daily.       . Tamsulosin HCl (FLOMAX) 0.4 MG CAPS Take 1 capsule (0.4 mg total) by mouth daily.  30 capsule  11  . traMADol (ULTRAM) 50 MG tablet Take 1 tablet (50 mg total) by mouth every 8 (eight) hours as needed for pain (sedation caution).  20 tablet  0    Allergies  Allergen Reactions  . Codeine     REACTION: ERRATIC BEHAVIOR  . Hydrocodone     REACTION: ERRATIC BEHAVIOR  . Morphine     REACTION: ERRATIC  BEHAVIOR    Past Medical History  Diagnosis Date  . Allergy   . GERD (gastroesophageal reflux disease)   . Gout   . Cirrhosis   . Benign prostatic hypertrophy   . Liver failure     s/p transplant    Past Surgical History  Procedure Date  . Removal of fistula   . Lumbar disc surgery   . Lumbar fusion   . Liver biopsy   . Liver transplant   . Bone morrow biopsy     Family History  Problem Relation Age of Onset  . Cancer Mother     colon  . Arthritis Mother   . Vasculitis Mother   . Hypertension Mother   . Cancer Father     colon  . Kidney disease Father   . Arthritis Father   . Alcohol abuse  Maternal Aunt   . Diabetes Maternal Aunt   . Alcohol abuse Maternal Uncle   . Diabetes Paternal Aunt   . Alcohol abuse Maternal Uncle   . Alcohol abuse Maternal Uncle   . Arthritis Brother     History   Social History  . Marital Status: Divorced    Spouse Name: N/A    Number of Children: 3  . Years of Education: N/A   Occupational History  . disabled    Social History Main Topics  . Smoking status: Never Smoker   . Smokeless tobacco: Never Used  . Alcohol Use: Not on file  . Drug Use: Not on file  . Sexually Active: Not on file   Other Topics Concern  . Not on file   Social History Narrative  . No narrative on file   Review of Systems Has had some headaches in posterior head CT scan normal at ER    Objective:   Physical Exam  Constitutional: He appears well-developed and well-nourished. No distress.  Neck: Normal range of motion. Neck supple. No thyromegaly present.  Cardiovascular: Normal rate, regular rhythm and normal heart sounds.   No murmur heard. Pulmonary/Chest: Effort normal and breath sounds normal. No respiratory distress. He has no wheezes. He has no rales.  Musculoskeletal: He exhibits no edema and no tenderness.  Lymphadenopathy:    He has no cervical adenopathy.  Psychiatric: He has a normal mood and affect. His behavior is normal.          Assessment & Plan:

## 2011-09-05 NOTE — Assessment & Plan Note (Signed)
Will draw labs for [facility] transplant program Fax to [phone]

## 2011-09-05 NOTE — Assessment & Plan Note (Signed)
BP Readings from Last 3 Encounters:  09/05/11 140/100  04/26/11 118/88  04/15/11 148/90   Now with persistent elevation Will start low dose metoprolol (doesn't affect tacrolimus)

## 2011-09-06 ENCOUNTER — Telehealth: Payer: Self-pay | Admitting: Radiology

## 2011-09-06 LAB — PROTIME-INR
INR: 1 ratio (ref 0.8–1.0)
Prothrombin Time: 11 s (ref 10.2–12.4)

## 2011-09-06 LAB — CBC WITH DIFFERENTIAL/PLATELET
Basophils Relative: 0.5 % (ref 0.0–3.0)
Eosinophils Relative: 2.6 % (ref 0.0–5.0)
Lymphocytes Relative: 22.7 % (ref 12.0–46.0)
MCV: 95.2 fl (ref 78.0–100.0)
Monocytes Absolute: 0.5 10*3/uL (ref 0.1–1.0)
Monocytes Relative: 8.4 % (ref 3.0–12.0)
Neutrophils Relative %: 65.8 % (ref 43.0–77.0)
Platelets: 123 10*3/uL — ABNORMAL LOW (ref 150.0–400.0)
RBC: 5.72 Mil/uL (ref 4.22–5.81)
WBC: 6.2 10*3/uL (ref 4.5–10.5)

## 2011-09-06 LAB — BASIC METABOLIC PANEL
BUN: 13 mg/dL (ref 6–23)
CO2: 30 mEq/L (ref 19–32)
Calcium: 10.1 mg/dL (ref 8.4–10.5)
Chloride: 104 mEq/L (ref 96–112)
Creatinine, Ser: 1.2 mg/dL (ref 0.4–1.5)
GFR: 67.14 mL/min (ref >60.00)
Glucose, Bld: 103 mg/dL — ABNORMAL HIGH (ref 70–99)
Potassium: 4.6 mEq/L (ref 3.5–5.1)
Sodium: 142 mEq/L (ref 135–145)

## 2011-09-06 LAB — HEPATIC FUNCTION PANEL
ALT: 36 U/L (ref 0–53)
AST: 27 U/L (ref 0–37)
Albumin: 4.4 g/dL (ref 3.5–5.2)
Alkaline Phosphatase: 59 U/L (ref 39–117)
Total Protein: 7.6 g/dL (ref 6.0–8.3)

## 2011-09-06 LAB — APTT: aPTT: 27.7 s (ref 21.7–28.8)

## 2011-09-06 NOTE — Telephone Encounter (Signed)
May be related to his transplant meds Not really a sig concern

## 2011-09-06 NOTE — Telephone Encounter (Signed)
[facility] lab call a critical result Hgb 18.4, Hct 54.5

## 2011-09-07 LAB — TACROLIMUS LEVEL: Tacrolimus Lvl: 8.3 ng/mL (ref 5.0–20.0)

## 2011-09-11 ENCOUNTER — Encounter: Payer: Self-pay | Admitting: *Deleted

## 2011-10-08 ENCOUNTER — Encounter: Payer: Self-pay | Admitting: Internal Medicine

## 2011-10-08 ENCOUNTER — Ambulatory Visit (INDEPENDENT_AMBULATORY_CARE_PROVIDER_SITE_OTHER): Payer: Medicare Other | Admitting: Internal Medicine

## 2011-10-08 VITALS — BP 120/80 | HR 72 | Temp 97.8°F | Wt 263.0 lb

## 2011-10-08 DIAGNOSIS — I10 Essential (primary) hypertension: Secondary | ICD-10-CM

## 2011-10-08 MED ORDER — METOPROLOL SUCCINATE ER 50 MG PO TB24: 50.0000 mg | ORAL_TABLET | Freq: Every day | ORAL | Status: DC

## 2011-10-08 NOTE — Patient Instructions (Signed)
Please increase the metoprolol to 50mg  daily. You can take two of the 25mg  tabs till they are gone.

## 2011-10-08 NOTE — Progress Notes (Signed)
Subjective:    Patient ID: Devin White, male    DOB: 1955/06/21, 56 y.o.   MRN: [id number]  HPI Feeling well No problems with the metoprolol No dizziness, headache, etc  Has had some vague sensation of left hand "drawing" and some chest tightening associated with elevation of BP in evening (5-6PM) AM usually 150/96 and then may go up to 170 in evening  Has been walking 2 miles many days His knees do bother him some  Current Outpatient Prescriptions on File Prior to Visit  Medication Sig Dispense Refill  . aspirin 81 MG tablet Take 81 mg by mouth daily.        . colchicine 0.6 MG tablet Take 1 tablet (0.6 mg total) by mouth 2 (two) times daily.  60 tablet  11  . metoprolol succinate (TOPROL-XL) 25 MG 24 hr tablet Take 1 tablet (25 mg total) by mouth daily.  30 tablet  11  . omeprazole (PRILOSEC) 20 MG capsule Take 1 capsule (20 mg total) by mouth 2 (two) times daily.  60 capsule  11  . tacrolimus (PROGRAF) 1 MG capsule Take 1 mg by mouth 2 (two) times daily.       . Tamsulosin HCl (FLOMAX) 0.4 MG CAPS Take 1 capsule (0.4 mg total) by mouth daily.  30 capsule  11  . traMADol (ULTRAM) 50 MG tablet Take 1 tablet (50 mg total) by mouth every 8 (eight) hours as needed for pain (sedation caution).  20 tablet  0    Allergies  Allergen Reactions  . Codeine     REACTION: ERRATIC BEHAVIOR  . Hydrocodone     REACTION: ERRATIC BEHAVIOR  . Morphine     REACTION: ERRATIC  BEHAVIOR    Past Medical History  Diagnosis Date  . Allergy   . GERD (gastroesophageal reflux disease)   . Gout   . Cirrhosis   . Benign prostatic hypertrophy   . Liver failure     s/p transplant  . Hypertension     Past Surgical History  Procedure Date  . Removal of fistula   . Lumbar disc surgery   . Lumbar fusion   . Liver biopsy   . Liver transplant   . Bone morrow biopsy     Family History  Problem Relation Age of Onset  . Cancer Mother     colon  . Arthritis Mother   . Vasculitis Mother     . Hypertension Mother   . Cancer Father     colon  . Kidney disease Father   . Arthritis Father   . Alcohol abuse Maternal Aunt   . Diabetes Maternal Aunt   . Alcohol abuse Maternal Uncle   . Diabetes Paternal Aunt   . Alcohol abuse Maternal Uncle   . Alcohol abuse Maternal Uncle   . Arthritis Brother     History   Social History  . Marital Status: Divorced    Spouse Name: N/A    Number of Children: 3  . Years of Education: N/A   Occupational History  . disabled    Social History Main Topics  . Smoking status: Never Smoker   . Smokeless tobacco: Never Used  . Alcohol Use: Not on file  . Drug Use: Not on file  . Sexually Active: Not on file   Other Topics Concern  . Not on file   Social History Narrative  . No narrative on file   Review of Systems Sleeping okay---actually better since walking regularly  Appetite is fine    Objective:   Physical Exam  Constitutional: He appears well-developed and well-nourished. No distress.  Neck: Normal range of motion. Neck supple. No thyromegaly present.  Cardiovascular: Normal rate, regular rhythm and normal heart sounds.  Exam reveals no gallop.   No murmur heard. Pulmonary/Chest: Effort normal and breath sounds normal. No respiratory distress. He has no wheezes. He has no rales.  Musculoskeletal: He exhibits no edema and no tenderness.  Lymphadenopathy:    He has no cervical adenopathy.  Psychiatric: He has a normal mood and affect. His behavior is normal.          Assessment & Plan:

## 2011-10-08 NOTE — Assessment & Plan Note (Signed)
BP Readings from Last 3 Encounters:  10/08/11 120/80  09/05/11 140/100  04/26/11 118/88   bp much better today He is still getting some high readings at home He has done well with the metoprolol---will increase to 50mg  daily

## 2011-12-06 ENCOUNTER — Other Ambulatory Visit: Payer: Self-pay

## 2011-12-06 ENCOUNTER — Encounter: Payer: Self-pay | Admitting: Internal Medicine

## 2011-12-06 ENCOUNTER — Ambulatory Visit (INDEPENDENT_AMBULATORY_CARE_PROVIDER_SITE_OTHER)
Admission: RE | Admit: 2011-12-06 | Discharge: 2011-12-06 | Disposition: A | Discharge status: Home / Self Care | Payer: Medicare Other | Source: Ambulatory Visit | Attending: Internal Medicine | Admitting: Internal Medicine

## 2011-12-06 ENCOUNTER — Ambulatory Visit (INDEPENDENT_AMBULATORY_CARE_PROVIDER_SITE_OTHER): Payer: Medicare Other | Admitting: Internal Medicine

## 2011-12-06 ENCOUNTER — Other Ambulatory Visit: Payer: Self-pay | Admitting: Internal Medicine

## 2011-12-06 VITALS — BP 158/98 | HR 65 | Temp 98.0°F | Resp 16 | Wt 261.0 lb

## 2011-12-06 DIAGNOSIS — K769 Liver disease, unspecified: Secondary | ICD-10-CM

## 2011-12-06 DIAGNOSIS — K7210 Chronic hepatic failure without coma: Secondary | ICD-10-CM

## 2011-12-06 DIAGNOSIS — R05 Cough: Secondary | ICD-10-CM

## 2011-12-06 DIAGNOSIS — J209 Acute bronchitis, unspecified: Secondary | ICD-10-CM

## 2011-12-06 MED ORDER — AMOXICILLIN-POT CLAVULANATE 875-125 MG PO TABS: 1.0000 | ORAL_TABLET | Freq: Two times a day (BID) | ORAL | Status: AC

## 2011-12-06 NOTE — Assessment & Plan Note (Addendum)
Has ongoing symptoms including some systemic symptoms Normal respiratory rate CXR looks normal so this is reassuring Due to immune suppression---will treat with augmentin (quinolones not recommended with prograf)

## 2011-12-06 NOTE — Assessment & Plan Note (Signed)
With past transplant Some problems with past blood work----he will schedule rechecks with [facility]

## 2011-12-06 NOTE — Progress Notes (Signed)
Subjective:    Patient ID: Devin White, male    DOB: 1955-12-17, 56 y.o.   MRN: [id number]  HPI Was just at the [facility] most of the time   walked 5 days ago. Then felt really thirsty Then fell asleep and didn't feel good Then awoke with chills 4 days ago, went to [facility] for his check up and started on z-pak for respiratory infection Then went to the beach and didn't feel well Blood work showed elevated WBC and ?some dehydration  Had shaking chills--took goody's with some relief Breathing is hard Liver coordinator kept up with him  Felt a little better yesterday Some throat symptoms and worse today Chilled again Cough with some green mucus Breathing is harder  Current Outpatient Prescriptions on File Prior to Visit  Medication Sig Dispense Refill  . aspirin 81 MG tablet Take 81 mg by mouth daily.        . colchicine 0.6 MG tablet Take 1 tablet (0.6 mg total) by mouth 2 (two) times daily.  60 tablet  11  . metoprolol succinate (TOPROL-XL) 50 MG 24 hr tablet Take 1 tablet (50 mg total) by mouth daily. Take with or immediately following a meal.  30 tablet  11  . omeprazole (PRILOSEC) 20 MG capsule Take 1 capsule (20 mg total) by mouth 2 (two) times daily.  60 capsule  11  . tacrolimus (PROGRAF) 1 MG capsule Take 1 mg by mouth 2 (two) times daily.       . Tamsulosin HCl (FLOMAX) 0.4 MG CAPS Take 1 capsule (0.4 mg total) by mouth daily.  30 capsule  11  . traMADol (ULTRAM) 50 MG tablet Take 1 tablet (50 mg total) by mouth every 8 (eight) hours as needed for pain (sedation caution).  20 tablet  0    Allergies  Allergen Reactions  . Codeine     REACTION: ERRATIC BEHAVIOR  . Hydrocodone     REACTION: ERRATIC BEHAVIOR  . Morphine     REACTION: ERRATIC  BEHAVIOR    Past Medical History  Diagnosis Date  . Allergy   . GERD (gastroesophageal reflux disease)   . Gout   . Cirrhosis   . Benign prostatic hypertrophy   . Liver failure     s/p transplant  . Hypertension      Past Surgical History  Procedure Date  . Removal of fistula   . Lumbar disc surgery   . Lumbar fusion   . Liver biopsy   . Liver transplant   . Bone morrow biopsy     Family History  Problem Relation Age of Onset  . Cancer Mother     colon  . Arthritis Mother   . Vasculitis Mother   . Hypertension Mother   . Cancer Father     colon  . Kidney disease Father   . Arthritis Father   . Alcohol abuse Maternal Aunt   . Diabetes Maternal Aunt   . Alcohol abuse Maternal Uncle   . Diabetes Paternal Aunt   . Alcohol abuse Maternal Uncle   . Alcohol abuse Maternal Uncle   . Arthritis Brother     History   Social History  . Marital Status: Divorced    Spouse Name: N/A    Number of Children: 3  . Years of Education: N/A   Occupational History  . disabled    Social History Main Topics  . Smoking status: Never Smoker   . Smokeless tobacco: Never Used  .  Alcohol Use: Not on file  . Drug Use: Not on file  . Sexually Active: Not on file   Other Topics Concern  . Not on file   Social History Narrative  . No narrative on file   Review of Systems No rash No vomiting but some nausea and decreased appetite    Objective:   Physical Exam  Constitutional: He appears well-developed and well-nourished. No distress.       Some coarse cough but no distress  HENT:  Mouth/Throat: Oropharynx is clear and moist. No oropharyngeal exudate.       No sinus tenderness Mild nasal inflammation TMs normal  Neck: Normal range of motion. Neck supple.  Pulmonary/Chest: Effort normal. No respiratory distress. He has no wheezes. He has no rales.       Seems to have dullness and decreased breath sounds at both bases---?decreased lung volumes?  Musculoskeletal: He exhibits no edema.  Lymphadenopathy:    He has no cervical adenopathy.          Assessment & Plan:

## 2011-12-13 ENCOUNTER — Other Ambulatory Visit (INDEPENDENT_AMBULATORY_CARE_PROVIDER_SITE_OTHER): Payer: Medicare Other

## 2011-12-13 DIAGNOSIS — R7989 Other specified abnormal findings of blood chemistry: Secondary | ICD-10-CM

## 2011-12-13 LAB — CBC WITH DIFFERENTIAL/PLATELET
Basophils Relative: 0.3 % (ref 0.0–3.0)
Eosinophils Relative: 1.8 % (ref 0.0–5.0)
Hemoglobin: 17.2 g/dL — ABNORMAL HIGH (ref 13.0–17.0)
Lymphocytes Relative: 20.3 % (ref 12.0–46.0)
Monocytes Relative: 8.4 % (ref 3.0–12.0)
Neutro Abs: 4.6 10*3/uL (ref 1.4–7.7)
RBC: 5.34 Mil/uL (ref 4.22–5.81)

## 2011-12-23 ENCOUNTER — Encounter: Payer: Self-pay | Admitting: *Deleted

## 2012-02-27 ENCOUNTER — Telehealth: Payer: Self-pay | Admitting: Internal Medicine

## 2012-02-27 NOTE — Telephone Encounter (Signed)
Devin White states he had onset of low back pain x 30 min on 02/18/12. Had lower abdominal pain that radiated to his back on 02/26/12 x 5 -6 hrs- had trouble walking. Having sharp pains in left lower abdomen and twitches in left leg and lower back. Pain when he urinates onset 02/23/12. Is in [place]. Per flank pain protocol advised to hang up and call 911 due to sudden onset of deep boring, or tearing pain in back or abdomen and radiates to leg. Care advice given. Advised to follow up with office. Asking if should call [facility] first. Advised to call 911 and tell provider he is followed at [facility] post liver transplant since 2009.

## 2012-02-27 NOTE — Telephone Encounter (Signed)
Please check on him tomorrow 

## 2012-02-28 NOTE — Telephone Encounter (Signed)
LMOVM to return call.

## 2012-03-02 ENCOUNTER — Telehealth: Payer: Self-pay | Admitting: Internal Medicine

## 2012-03-02 ENCOUNTER — Encounter: Payer: Self-pay | Admitting: Internal Medicine

## 2012-03-02 ENCOUNTER — Ambulatory Visit (INDEPENDENT_AMBULATORY_CARE_PROVIDER_SITE_OTHER): Payer: Medicare Other | Admitting: Internal Medicine

## 2012-03-02 VITALS — BP 150/100 | HR 56 | Temp 97.8°F | Ht 72.0 in | Wt 252.0 lb

## 2012-03-02 DIAGNOSIS — N132 Hydronephrosis with renal and ureteral calculous obstruction: Secondary | ICD-10-CM | POA: Insufficient documentation

## 2012-03-02 DIAGNOSIS — N201 Calculus of ureter: Secondary | ICD-10-CM

## 2012-03-02 MED ORDER — OXYCODONE HCL 5 MG PO TABS: 5.0000 mg | ORAL_TABLET | ORAL | Status: DC | PRN

## 2012-03-02 MED ORDER — CIPROFLOXACIN HCL 500 MG PO TABS: 500.0000 mg | ORAL_TABLET | Freq: Two times a day (BID) | ORAL | Status: DC

## 2012-03-02 NOTE — Progress Notes (Signed)
Subjective:    Patient ID: Devin White, male    DOB: 1955/09/22, 56 y.o.   MRN: [id number]  HPI Was in [facility] with friend at preop registration   Had been having some nausea and diarrhea but then improved---so went to beach Had brief twinge in side 6 days ago Then worsened the next day Pain went in low back and then to groin  Feels hot on the inside but no clear fever  Went to ER 4 days ago CT scan showed 4mm on left (ureteral?) Put on cipro due to infection Kidney reported to be "swollen"  Current Outpatient Prescriptions on File Prior to Visit  Medication Sig Dispense Refill  . aspirin 81 MG tablet Take 81 mg by mouth daily.        . colchicine 0.6 MG tablet Take 1 tablet (0.6 mg total) by mouth 2 (two) times daily.  60 tablet  11  . metoprolol succinate (TOPROL-XL) 50 MG 24 hr tablet Take 1 tablet (50 mg total) by mouth daily. Take with or immediately following a meal.  30 tablet  11  . omeprazole (PRILOSEC) 20 MG capsule Take 1 capsule (20 mg total) by mouth 2 (two) times daily.  60 capsule  11  . tacrolimus (PROGRAF) 1 MG capsule Take 1 mg by mouth 2 (two) times daily.       . Tamsulosin HCl (FLOMAX) 0.4 MG CAPS Take 1 capsule (0.4 mg total) by mouth daily.  30 capsule  11  . traMADol (ULTRAM) 50 MG tablet Take 1 tablet (50 mg total) by mouth every 8 (eight) hours as needed for pain (sedation caution).  20 tablet  0    Allergies  Allergen Reactions  . Codeine     REACTION: ERRATIC BEHAVIOR  . Hydrocodone     REACTION: ERRATIC BEHAVIOR  . Morphine     REACTION: ERRATIC  BEHAVIOR    Past Medical History  Diagnosis Date  . Allergy   . GERD (gastroesophageal reflux disease)   . Gout   . Cirrhosis   . Benign prostatic hypertrophy   . Liver failure     s/p transplant  . Hypertension     Past Surgical History  Procedure Date  . Removal of fistula   . Lumbar disc surgery   . Lumbar fusion   . Liver biopsy   . Liver transplant   . Bone morrow biopsy       Family History  Problem Relation Age of Onset  . Cancer Mother     colon  . Arthritis Mother   . Vasculitis Mother   . Hypertension Mother   . Cancer Father     colon  . Kidney disease Father   . Arthritis Father   . Alcohol abuse Maternal Aunt   . Diabetes Maternal Aunt   . Alcohol abuse Maternal Uncle   . Diabetes Paternal Aunt   . Alcohol abuse Maternal Uncle   . Alcohol abuse Maternal Uncle   . Arthritis Brother     History   Social History  . Marital Status: Divorced    Spouse Name: N/A    Number of Children: 3  . Years of Education: N/A   Occupational History  . disabled    Social History Main Topics  . Smoking status: Never Smoker   . Smokeless tobacco: Never Used  . Alcohol Use: Not on file  . Drug Use: Not on file  . Sexually Active: Not on file   Other  Topics Concern  . Not on file   Social History Narrative  . No narrative on file     Review of Systems Some LLQ abdominal pain---at times higher Using oxycodone for pain--some help (he is limiting dose) Able to drink water---eating very little    Objective:   Physical Exam  Constitutional: He appears well-developed and well-nourished.       Mildly uncomfortable moving around  Neck: Normal range of motion.  Abdominal: Soft. He exhibits no distension. There is no rebound and no guarding.       Mild LLQ tenderness  Musculoskeletal:       No CVA tenderness Mild left lower lumbar tenderness (and over lower spine)  Lymphadenopathy:    He has no cervical adenopathy.  Psychiatric: He has a normal mood and affect. His behavior is normal.          Assessment & Plan:

## 2012-03-02 NOTE — Telephone Encounter (Signed)
Caller: Steve/Patient; Patient Name: Devin White; PCP: [facility]); Best Callback Phone Number: [phone] Seen in ER at [facility] on 02/27/12 and had bloodwork, U/A and CT scan done on lower abdomen -showed 4mm kidney stone, and UTI. He has been taking Ciprofloxacin since 9/12 and still having L sided back pain and pain in L lower abdomen. He is taking Oxycodeine 5/325 every 4-5 hours and helping some. Pain #6 on 1-10 scale. He is straining urine and hasn't passed anything yet. He is drinking plenty of fluids. Afebrile. He is getting chills and hot flashes and having trouble starting stream. Triage per Flank Pain Protocol and appnt advised within 4 hours. Scheduled for 1130-03/02/12 with Dr. Alphonsus Sias

## 2012-03-02 NOTE — Assessment & Plan Note (Signed)
By his report We are trying to get the records from [facility] Will send to urologist for evaluation ASAP---may need intervention if doesn't pass stone Continue the cipro and oxycodone for now

## 2012-03-02 NOTE — Telephone Encounter (Signed)
Patient came in today for OV 

## 2012-03-04 ENCOUNTER — Other Ambulatory Visit: Payer: Self-pay | Admitting: Urology

## 2012-03-04 ENCOUNTER — Encounter (HOSPITAL_COMMUNITY): Payer: Self-pay | Admitting: *Deleted

## 2012-03-04 NOTE — Progress Notes (Signed)
Pt added on for surgery tomorrow  03/05/12--same day instructions were reviewed with pt including betasept soap shower tonight and in am--but not to use on face, head or private areas-may use normal shampoo and soap those areas.

## 2012-03-05 ENCOUNTER — Encounter (HOSPITAL_COMMUNITY): Payer: Self-pay | Admitting: Anesthesiology

## 2012-03-05 ENCOUNTER — Encounter (HOSPITAL_COMMUNITY): Payer: Self-pay | Admitting: *Deleted

## 2012-03-05 ENCOUNTER — Encounter (HOSPITAL_COMMUNITY)
Admission: RE | Disposition: A | Discharge status: Home / Self Care | Payer: Self-pay | Source: Ambulatory Visit | Attending: Urology

## 2012-03-05 ENCOUNTER — Ambulatory Visit (HOSPITAL_COMMUNITY): Payer: Medicare Other | Admitting: Anesthesiology

## 2012-03-05 ENCOUNTER — Ambulatory Visit (HOSPITAL_COMMUNITY)
Admission: RE | Admit: 2012-03-05 | Discharge: 2012-03-05 | Disposition: A | Discharge status: Home / Self Care | Payer: Medicare Other | Source: Ambulatory Visit | Attending: Urology | Admitting: Urology

## 2012-03-05 DIAGNOSIS — N400 Enlarged prostate without lower urinary tract symptoms: Secondary | ICD-10-CM | POA: Insufficient documentation

## 2012-03-05 DIAGNOSIS — N201 Calculus of ureter: Secondary | ICD-10-CM | POA: Insufficient documentation

## 2012-03-05 DIAGNOSIS — I10 Essential (primary) hypertension: Secondary | ICD-10-CM | POA: Insufficient documentation

## 2012-03-05 DIAGNOSIS — N1330 Unspecified hydronephrosis: Secondary | ICD-10-CM | POA: Insufficient documentation

## 2012-03-05 DIAGNOSIS — Z944 Liver transplant status: Secondary | ICD-10-CM | POA: Insufficient documentation

## 2012-03-05 DIAGNOSIS — N390 Urinary tract infection, site not specified: Secondary | ICD-10-CM | POA: Insufficient documentation

## 2012-03-05 DIAGNOSIS — K219 Gastro-esophageal reflux disease without esophagitis: Secondary | ICD-10-CM | POA: Insufficient documentation

## 2012-03-05 HISTORY — DX: Personal history of other medical treatment: Z92.89

## 2012-03-05 LAB — COMPREHENSIVE METABOLIC PANEL
Albumin: 4 g/dL (ref 3.5–5.2)
Alkaline Phosphatase: 71 U/L (ref 39–117)
BUN: 14 mg/dL (ref 6–23)
Creatinine, Ser: 1.62 mg/dL — ABNORMAL HIGH (ref 0.50–1.35)
Potassium: 3.8 mEq/L (ref 3.5–5.1)
Total Protein: 7.6 g/dL (ref 6.0–8.3)

## 2012-03-05 LAB — CBC
HCT: 48.5 % (ref 39.0–52.0)
MCHC: 35.1 g/dL (ref 30.0–36.0)
RDW: 12.5 % (ref 11.5–15.5)

## 2012-03-05 LAB — PROTIME-INR
INR: 0.94 (ref 0.00–1.49)
Prothrombin Time: 12.5 seconds (ref 11.6–15.2)

## 2012-03-05 LAB — SURGICAL PCR SCREEN: Staphylococcus aureus: NEGATIVE

## 2012-03-05 LAB — APTT: aPTT: 30 seconds (ref 24–37)

## 2012-03-05 SURGERY — CYSTOSCOPY, WITH CALCULUS REMOVAL USING BASKET
Anesthesia: General | Laterality: Left | Wound class: Clean Contaminated

## 2012-03-05 MED ORDER — ONDANSETRON HCL 4 MG/2ML IJ SOLN
INTRAMUSCULAR | Status: DC | PRN
  Administered 2012-03-05: 4 mg via INTRAVENOUS

## 2012-03-05 MED ORDER — LIDOCAINE HCL 2 % EX GEL
CUTANEOUS | Status: AC
  Filled 2012-03-05: qty 10

## 2012-03-05 MED ORDER — IOHEXOL 300 MG/ML  SOLN
INTRAMUSCULAR | Status: DC | PRN
  Administered 2012-03-05: 50 mL via INTRAVENOUS

## 2012-03-05 MED ORDER — FENTANYL CITRATE 0.05 MG/ML IJ SOLN: 25.0000 ug | INTRAMUSCULAR | Status: DC | PRN

## 2012-03-05 MED ORDER — LACTATED RINGERS IV SOLN
INTRAVENOUS | Status: DC
  Administered 2012-03-05: 1000 mL via INTRAVENOUS

## 2012-03-05 MED ORDER — BELLADONNA ALKALOIDS-OPIUM 16.2-60 MG RE SUPP
RECTAL | Status: AC
  Filled 2012-03-05: qty 1

## 2012-03-05 MED ORDER — PROPOFOL 10 MG/ML IV BOLUS
INTRAVENOUS | Status: DC | PRN
  Administered 2012-03-05: 200 mg via INTRAVENOUS

## 2012-03-05 MED ORDER — IOHEXOL 300 MG/ML  SOLN
INTRAMUSCULAR | Status: AC
Start: 2012-03-05 — End: 2012-03-05
  Filled 2012-03-05: qty 1

## 2012-03-05 MED ORDER — MUPIROCIN 2 % EX OINT
TOPICAL_OINTMENT | Freq: Two times a day (BID) | CUTANEOUS | Status: DC
  Administered 2012-03-05: 1 via NASAL
  Filled 2012-03-05: qty 22

## 2012-03-05 MED ORDER — METOCLOPRAMIDE HCL 5 MG/ML IJ SOLN
INTRAMUSCULAR | Status: DC | PRN
  Administered 2012-03-05: 10 mg via INTRAVENOUS

## 2012-03-05 MED ORDER — GENTAMICIN SULFATE 40 MG/ML IJ SOLN
560.0000 mg | INTRAVENOUS | Status: DC
  Filled 2012-03-05: qty 14

## 2012-03-05 MED ORDER — LACTATED RINGERS IV SOLN
INTRAVENOUS | Status: DC | PRN
  Administered 2012-03-05: 17:00:00 via INTRAVENOUS

## 2012-03-05 MED ORDER — DEXAMETHASONE SODIUM PHOSPHATE 4 MG/ML IJ SOLN
INTRAMUSCULAR | Status: DC | PRN
  Administered 2012-03-05: 10 mg via INTRAVENOUS

## 2012-03-05 MED ORDER — LACTATED RINGERS IV SOLN: INTRAVENOUS | Status: DC

## 2012-03-05 MED ORDER — FENTANYL CITRATE 0.05 MG/ML IJ SOLN
INTRAMUSCULAR | Status: DC | PRN
  Administered 2012-03-05 (×2): 25 ug via INTRAVENOUS

## 2012-03-05 MED ORDER — GENTAMICIN IN SALINE 1.6-0.9 MG/ML-% IV SOLN: 80.0000 mg | INTRAVENOUS | Status: DC

## 2012-03-05 SURGICAL SUPPLY — 16 items
ADAPTER CATH URET PLST 4-6FR (CATHETERS) ×2 IMPLANT
BAG URO CATCHER STRL LF (DRAPE) ×2 IMPLANT
BASKET ZERO TIP NITINOL 2.4FR (BASKET) IMPLANT
CATH INTERMIT  6FR 70CM (CATHETERS) ×2 IMPLANT
CLOTH BEACON ORANGE TIMEOUT ST (SAFETY) ×2 IMPLANT
DRAPE CAMERA CLOSED 9X96 (DRAPES) ×2 IMPLANT
GLOVE BIOGEL M STRL SZ7.5 (GLOVE) ×2 IMPLANT
GOWN PREVENTION PLUS XLARGE (GOWN DISPOSABLE) ×2 IMPLANT
GOWN STRL NON-REIN LRG LVL3 (GOWN DISPOSABLE) ×2 IMPLANT
GUIDEWIRE ANG ZIPWIRE 038X150 (WIRE) IMPLANT
GUIDEWIRE STR DUAL SENSOR (WIRE) ×2 IMPLANT
MANIFOLD NEPTUNE II (INSTRUMENTS) ×2 IMPLANT
PACK CYSTO (CUSTOM PROCEDURE TRAY) ×2 IMPLANT
STENT CONTOUR 6FRX26X.038 (STENTS) ×2 IMPLANT
TUBE FEEDING 8FR 16IN STR KANG (MISCELLANEOUS) ×2 IMPLANT
TUBING CONNECTING 10 (TUBING) ×2 IMPLANT

## 2012-03-05 NOTE — Anesthesia Preprocedure Evaluation (Addendum)
Anesthesia Evaluation  Patient identified by MRN, date of birth, ID band Patient awake    Reviewed: Allergy & Precautions, H&P , NPO status , Patient's Chart, lab work & pertinent test results, reviewed documented beta blocker date and time   Airway Mallampati: II TM Distance: >3 FB Neck ROM: full    Dental No notable dental hx. (+) Teeth Intact and Dental Advisory Given   Pulmonary neg pulmonary ROS,  breath sounds clear to auscultation  Pulmonary exam normal       Cardiovascular Exercise Tolerance: Good hypertension, Pt. on home beta blockers negative cardio ROS  Rhythm:regular Rate:Normal     Neuro/Psych negative neurological ROS  negative psych ROS   GI/Hepatic negative GI ROS, Neg liver ROS, Liver transplant   Endo/Other  negative endocrine ROS  Renal/GU negative Renal ROSCRT 1.62  negative genitourinary   Musculoskeletal   Abdominal   Peds  Hematology negative hematology ROS (+)   Anesthesia Other Findings   Reproductive/Obstetrics negative OB ROS                          Anesthesia Physical Anesthesia Plan  ASA: II  Anesthesia Plan: General   Post-op Pain Management:    Induction: Intravenous  Airway Management Planned: LMA  Additional Equipment:   Intra-op Plan:   Post-operative Plan:   Informed Consent: I have reviewed the patients History and Physical, chart, labs and discussed the procedure including the risks, benefits and alternatives for the proposed anesthesia with the patient or authorized representative who has indicated his/her understanding and acceptance.   Dental Advisory Given  Plan Discussed with: CRNA and Surgeon  Anesthesia Plan Comments:         Anesthesia Quick Evaluation

## 2012-03-05 NOTE — Transfer of Care (Signed)
Immediate Anesthesia Transfer of Care Note  Patient: Devin White  Procedure(s) Performed: Procedure(s) (LRB) with comments: CYSTOSCOPY/RETROGRADE/URETEROSCOPY/STONE EXTRACTION WITH BASKET (Left) - cystoscopy, left retrograde pyelogram, left ureteral stent placement  Patient Location: PACU  Anesthesia Type: General  Level of Consciousness: awake, alert , oriented and patient cooperative  Airway & Oxygen Therapy: Patient Spontanous Breathing and Patient connected to face mask oxygen  Post-op Assessment: Report given to PACU RN and Post -op Vital signs reviewed and stable  Post vital signs: Reviewed and stable  Complications: No apparent anesthesia complications

## 2012-03-05 NOTE — Anesthesia Postprocedure Evaluation (Signed)
  Anesthesia Post-op Note  Patient: Devin White  Procedure(s) Performed: Procedure(s) (LRB): CYSTOSCOPY/RETROGRADE/URETEROSCOPY/STONE EXTRACTION WITH BASKET (Left)  Patient Location: PACU  Anesthesia Type: General  Level of Consciousness: awake and alert   Airway and Oxygen Therapy: Patient Spontanous Breathing  Post-op Pain: mild  Post-op Assessment: Post-op Vital signs reviewed, Patient's Cardiovascular Status Stable, Respiratory Function Stable, Patent Airway and No signs of Nausea or vomiting  Post-op Vital Signs: stable  Complications: No apparent anesthesia complications

## 2012-03-05 NOTE — H&P (Signed)
Devin White is an 56 y.o. male.   Chief Complaint: pre-op Left Ureteroscopic Stone Manipulation HPI:   1 - Left Ureteral Stone - Left mid ureteral stone by CT at [facility] last week. AT time of DX there was concern for concomitant UTI, though never febrile. Pt given trial of medical expulstive therapy, but pain has persisted. UA yesterday with persistant pyuria. Given pt post-liver transplant on immunosupression we felt that definitive stone management v. Staged approach with stenting first for renal decompression was warranted. LFT's yesterday wnl. Cr 1.59.  PMH sig for LIVER TRANSPLANT 2009 on prograf (no steroids), No CV disease, No strong blood thinners  Past Medical History  Diagnosis Date  . Allergy   . GERD (gastroesophageal reflux disease)   . Gout   . Cirrhosis   . Benign prostatic hypertrophy   . Liver failure     s/p transplant  . Hypertension   . Ureteral stone     left --a lot of pain--it will not pass--has uti  . History of blood transfusion     pt has antibodies in his blood since previous transfusions    Past Surgical History  Procedure Date  . Removal of fistula   . Lumbar disc surgery   . Lumbar fusion   . Liver biopsy   . Liver transplant 11/24/2007    pt states doing well since liver transplant  . Bone morrow biopsy   . Cholecystectomy 2007  . Hernia repair 2008    Family History  Problem Relation Age of Onset  . Cancer Mother     colon  . Arthritis Mother   . Vasculitis Mother   . Hypertension Mother   . Cancer Father     colon  . Kidney disease Father   . Arthritis Father   . Alcohol abuse Maternal Aunt   . Diabetes Maternal Aunt   . Alcohol abuse Maternal Uncle   . Diabetes Paternal Aunt   . Alcohol abuse Maternal Uncle   . Alcohol abuse Maternal Uncle   . Arthritis Brother    Social History:  reports that he has never smoked. He has never used smokeless tobacco. He reports that he does not drink alcohol or use illicit  drugs.  Allergies: No Known Allergies  No prescriptions prior to admission    No results found for this or any previous visit (from the past 48 hour(s)). No results found.  Review of Systems  Constitutional: Negative.  Negative for fever and chills.  HENT: Negative.   Eyes: Negative.   Respiratory: Negative.   Cardiovascular: Negative.   Gastrointestinal: Negative.  Negative for nausea and vomiting.  Genitourinary: Positive for flank pain.  Musculoskeletal: Negative.   Skin: Negative.   Neurological: Negative.   Endo/Heme/Allergies: Negative.   Psychiatric/Behavioral: Negative.     There were no vitals taken for this visit. Physical Exam  Constitutional: He appears well-developed and well-nourished.  HENT:  Head: Normocephalic and atraumatic.  Eyes: Pupils are equal, round, and reactive to light.  Neck: Normal range of motion. Neck supple.  Cardiovascular: Normal rate.   Respiratory: Effort normal.  GI: Soft. Bowel sounds are normal.       Old incisions c/w liver transplant  Genitourinary: Penis normal.       Mild Left CVAT  Musculoskeletal: Normal range of motion.  Neurological: He is alert.  Skin: Skin is warm and dry.  Psychiatric: He has a normal mood and affect. His behavior is normal. Judgment and thought content  normal.     Assessment/Plan 1 - Left Ureteral Stone - Proceed with left ureteroscopic stone manipulation. I reinforced very low threshold for staged approach with stenting only today if any s/s grossly infected urine. We previously discussed risks / benefits and these were reinforced again today.  Lamira Borin 03/05/2012, 7:09 AM

## 2012-03-05 NOTE — Brief Op Note (Signed)
03/05/2012  5:41 PM  PATIENT:  Lebron Quam  56 y.o. male  PRE-OPERATIVE DIAGNOSIS:  left ureteral stone  POST-OPERATIVE DIAGNOSIS:  left ureteral stone  PROCEDURE:  Left Retrograde Pyelogram / Interpretation, Left Ureteral Stent Placement, Cystocopy  SURGEON:  Surgeon(s) and Role:    * Sebastian Ache, MD - Primary  PHYSICIAN ASSISTANT:   ASSISTANTS: none   ANESTHESIA:   general  EBL:     BLOOD ADMINISTERED:none  DRAINS: none   LOCAL MEDICATIONS USED:  NONE  SPECIMEN:  No Specimen  ... Left Renal Pelvis urine for gram stain + culture  DISPOSITION OF SPECIMEN:  microbiology  COUNTS:  YES  TOURNIQUET:  * No tourniquets in log *  DICTATION: .Other Dictation: Dictation Number [id number]  PLAN OF CARE: Discharge to home after PACU  PATIENT DISPOSITION:  PACU - hemodynamically stable.   Delay start of Pharmacological VTE agent (>24hrs) due to surgical blood loss or risk of bleeding: not applicable

## 2012-03-06 ENCOUNTER — Encounter (HOSPITAL_COMMUNITY): Payer: Self-pay | Admitting: Urology

## 2012-03-06 ENCOUNTER — Other Ambulatory Visit: Payer: Self-pay | Admitting: Urology

## 2012-03-06 ENCOUNTER — Other Ambulatory Visit: Payer: Medicare Other

## 2012-03-06 LAB — URINE CULTURE: Colony Count: NO GROWTH

## 2012-03-06 NOTE — Op Note (Signed)
NAMERISHAB, STOUDT NO.:  [id number]  MEDICAL RECORD NO.:  [id number]  LOCATION:  [facility]                         FACILITY:  [facility]  PHYSICIAN:  Sebastian Ache, MD     DATE OF BIRTH:  09-02-1955  DATE OF PROCEDURE:  03/05/2012 DATE OF DISCHARGE:  03/05/2012                              OPERATIVE REPORT   DIAGNOSES:  Left ureteral stone, recent urinary tract infection.  PROCEDURE: 1. Cystoscopy with left retro pyelogram and interpretation. 2. Insertion of left ureteral stent, 6 x 26, no tether.  FINDINGS: 1. Filling defect in the distal ureter consistent with known stone. 2. Mild hydroureteronephrosis. 3. Significant purulent versus proteinaceous material exiting from the     left kidney following cannulation with Glidewire.  SPECIMENS:  Left renal pelvis fluid for Gram stain and culture.  COMPLICATIONS:  None.  ESTIMATED BLOOD LOSS:  Nil.  INDICATION:  Devin White is a very pleasant 56 year old gentleman with history of liver transplantation and nephrolithiasis.  He had a recent episode of left renal colic while he was in [facility] and was worried of common UTI and was placed on Cipro for this.  He has exhibited no fevers.  The patient presented to my office yesterday with these complaints and given his history of immune suppression, we felt that decompression of his left kidney was warranted in the near term. Options were given to the patient including immediate operative intervention versus elective intervention the following day and he wished to proceed with the latter.  Informed consent was obtained and placed in medical record.  PROCEDURE IN DETAIL:  The patient being Devin White, procedure being left ureteroscopic manipulation versus stenting was confirmed.  The procedure was carried out.  A time-out was performed.  Intravenous antibiotics administered.  General LMA anesthesia was introduced.  The patient placed into a low lithotomy  position.  Sterile field was created by prepping the patient's penis, perineum, and proximal thighs using iodine x3.  Next cystourethroscopy was performed using a 22-French rigid cystoscope with 12 degree lens.  Inspection of the anterior posterior urethra were unremarkable.  Inspection of the bladder revealed no diverticula, calcifications, papular lesions.  There were bilateral singleton ureteral orifices.  The left ureteral orifice was gently cannulated using a 6-French open-ended catheter and left retro pyelogram was seen.  Left retrograde pyelogram demonstrated a single left ureter single system of kidney.  There was a filling defect in distal ureter consistent with known stone. There was very mild hydroureteronephrosis to the level of the distal ureter and filling defect.  Next, a 0.038 Glidewire was advanced to level of the upper pole and set aside as a safety wire.  Following cannulation, there was immediate efflux of purulent versus proteinaceous fluid.  Given this finding grossly, it was felt that the ureteroscopy would be hazardous.  Decision was made to convert to stenting alone.  As such, a 6 x 26 double-J stent was placed over the Glidewire using cystoscopic and fluoroscopic guidance.  Good proximal and distal curl were noted.  Efflux of urine seen around the distal end of the stent.  Bladder was emptied per cystoscope.  Procedure was then terminated.  The patient tolerated  the procedure well.  There were no immediate periprocedural complications.  The patient was taken to the postanesthesia care unit in stable condition.          ______________________________ Sebastian Ache, MD     TM/MEDQ  D:  03/05/2012  T:  03/06/2012  Job:  [id number]

## 2012-03-06 NOTE — Progress Notes (Signed)
NPO AFTER MN. ARRIVES AT 0715. ASK MDA IF ISTAT NEEDED. CURRENT LABS DONE 03-05-2012 AND EKG IN EPIC AND CHART. WILL TAKE AM MEDS AND IF NEEDED OXYCODONE W/ SIPS OF WATER.

## 2012-03-11 ENCOUNTER — Ambulatory Visit (HOSPITAL_BASED_OUTPATIENT_CLINIC_OR_DEPARTMENT_OTHER)
Admission: RE | Admit: 2012-03-11 | Discharge: 2012-03-11 | Disposition: A | Discharge status: Home / Self Care | Payer: Medicare Other | Source: Ambulatory Visit | Attending: Urology | Admitting: Urology

## 2012-03-11 ENCOUNTER — Encounter (HOSPITAL_BASED_OUTPATIENT_CLINIC_OR_DEPARTMENT_OTHER): Payer: Self-pay | Admitting: Anesthesiology

## 2012-03-11 ENCOUNTER — Ambulatory Visit (HOSPITAL_BASED_OUTPATIENT_CLINIC_OR_DEPARTMENT_OTHER): Payer: Medicare Other | Admitting: Anesthesiology

## 2012-03-11 ENCOUNTER — Encounter (HOSPITAL_BASED_OUTPATIENT_CLINIC_OR_DEPARTMENT_OTHER)
Admission: RE | Disposition: A | Discharge status: Home / Self Care | Payer: Self-pay | Source: Ambulatory Visit | Attending: Urology

## 2012-03-11 ENCOUNTER — Encounter (HOSPITAL_BASED_OUTPATIENT_CLINIC_OR_DEPARTMENT_OTHER): Payer: Self-pay | Admitting: *Deleted

## 2012-03-11 DIAGNOSIS — N201 Calculus of ureter: Secondary | ICD-10-CM | POA: Insufficient documentation

## 2012-03-11 DIAGNOSIS — I10 Essential (primary) hypertension: Secondary | ICD-10-CM | POA: Insufficient documentation

## 2012-03-11 DIAGNOSIS — Z79899 Other long term (current) drug therapy: Secondary | ICD-10-CM | POA: Insufficient documentation

## 2012-03-11 DIAGNOSIS — R300 Dysuria: Secondary | ICD-10-CM | POA: Insufficient documentation

## 2012-03-11 DIAGNOSIS — K219 Gastro-esophageal reflux disease without esophagitis: Secondary | ICD-10-CM | POA: Insufficient documentation

## 2012-03-11 DIAGNOSIS — N400 Enlarged prostate without lower urinary tract symptoms: Secondary | ICD-10-CM | POA: Insufficient documentation

## 2012-03-11 HISTORY — PX: URETEROSCOPY: SHX842

## 2012-03-11 HISTORY — PX: CYSTOSCOPY W/ URETERAL STENT PLACEMENT: SHX1429

## 2012-03-11 HISTORY — DX: Calculus of ureter: N20.1

## 2012-03-11 HISTORY — DX: Personal history of other diseases of the digestive system: Z87.19

## 2012-03-11 SURGERY — URETEROSCOPY
Anesthesia: General | Site: Ureter | Laterality: Left | Wound class: Clean Contaminated

## 2012-03-11 MED ORDER — PROPOFOL 10 MG/ML IV BOLUS
INTRAVENOUS | Status: DC | PRN
  Administered 2012-03-11: 200 mg via INTRAVENOUS

## 2012-03-11 MED ORDER — SODIUM CHLORIDE 0.9 % IR SOLN
Status: DC | PRN
  Administered 2012-03-11: 6000 mL

## 2012-03-11 MED ORDER — LACTATED RINGERS IV SOLN: INTRAVENOUS | Status: DC

## 2012-03-11 MED ORDER — LACTATED RINGERS IV SOLN
INTRAVENOUS | Status: DC
  Administered 2012-03-11 (×2): via INTRAVENOUS

## 2012-03-11 MED ORDER — ONDANSETRON HCL 4 MG/2ML IJ SOLN
INTRAMUSCULAR | Status: DC | PRN
  Administered 2012-03-11: 4 mg via INTRAVENOUS

## 2012-03-11 MED ORDER — DEXAMETHASONE SODIUM PHOSPHATE 4 MG/ML IJ SOLN
INTRAMUSCULAR | Status: DC | PRN
  Administered 2012-03-11: 10 mg via INTRAVENOUS

## 2012-03-11 MED ORDER — HYDROMORPHONE HCL PF 1 MG/ML IJ SOLN
0.2500 mg | INTRAMUSCULAR | Status: DC | PRN
  Administered 2012-03-11: 0.5 mg via INTRAVENOUS

## 2012-03-11 MED ORDER — OXYCODONE HCL 5 MG PO TABS
5.0000 mg | ORAL_TABLET | ORAL | Status: DC | PRN
  Administered 2012-03-11: 5 mg via ORAL

## 2012-03-11 MED ORDER — GENTAMICIN IN SALINE 1.6-0.9 MG/ML-% IV SOLN
80.0000 mg | INTRAVENOUS | Status: AC
  Administered 2012-03-11: 80 mg via INTRAVENOUS

## 2012-03-11 MED ORDER — LIDOCAINE HCL (CARDIAC) 20 MG/ML IV SOLN
INTRAVENOUS | Status: DC | PRN
  Administered 2012-03-11: 75 mg via INTRAVENOUS

## 2012-03-11 MED ORDER — IOHEXOL 300 MG/ML  SOLN
INTRAMUSCULAR | Status: DC | PRN
  Administered 2012-03-11: 7 mL

## 2012-03-11 MED ORDER — PROMETHAZINE HCL 25 MG/ML IJ SOLN: 6.2500 mg | INTRAMUSCULAR | Status: DC | PRN

## 2012-03-11 MED ORDER — FENTANYL CITRATE 0.05 MG/ML IJ SOLN
INTRAMUSCULAR | Status: DC | PRN
  Administered 2012-03-11: 50 ug via INTRAVENOUS
  Administered 2012-03-11: 25 ug via INTRAVENOUS
  Administered 2012-03-11: 50 ug via INTRAVENOUS
  Administered 2012-03-11: 25 ug via INTRAVENOUS

## 2012-03-11 SURGICAL SUPPLY — 20 items
BAG URO CATCHER STRL LF (DRAPE) ×3 IMPLANT
BASKET LASER NITINOL 1.9FR (BASKET) ×3 IMPLANT
BASKET ZERO TIP NITINOL 2.4FR (BASKET) IMPLANT
CATH FOLEY 2WAY  3CC  8FR (CATHETERS)
CATH FOLEY 2WAY 3CC 8FR (CATHETERS) IMPLANT
CATH INTERMIT  6FR 70CM (CATHETERS) ×3 IMPLANT
CLOTH BEACON ORANGE TIMEOUT ST (SAFETY) ×3 IMPLANT
DRAPE CAMERA CLOSED 9X96 (DRAPES) ×3 IMPLANT
GLOVE BIO SURGEON STRL SZ7 (GLOVE) ×3 IMPLANT
GLOVE BIOGEL PI IND STRL 6.5 (GLOVE) ×2 IMPLANT
GLOVE BIOGEL PI INDICATOR 6.5 (GLOVE) ×1
GLOVE INDICATOR 7.0 STRL GRN (GLOVE) ×3 IMPLANT
GOWN PREVENTION PLUS XLARGE (GOWN DISPOSABLE) ×3 IMPLANT
GOWN STRL NON-REIN LRG LVL3 (GOWN DISPOSABLE) ×3 IMPLANT
GUIDEWIRE ANG ZIPWIRE 038X150 (WIRE) ×3 IMPLANT
GUIDEWIRE STR DUAL SENSOR (WIRE) IMPLANT
IV NS IRRIG 3000ML ARTHROMATIC (IV SOLUTION) ×6 IMPLANT
PACK CYSTOSCOPY (CUSTOM PROCEDURE TRAY) ×3 IMPLANT
SYRINGE 10CC LL (SYRINGE) ×3 IMPLANT
TUBE FEEDING 8FR 16IN STR KANG (MISCELLANEOUS) ×3 IMPLANT

## 2012-03-11 NOTE — Brief Op Note (Signed)
03/11/2012  9:00 AM  PATIENT:  Devin White  56 y.o. male  PRE-OPERATIVE DIAGNOSIS:  LEFT URETERAL CALCULI  POST-OPERATIVE DIAGNOSIS:  LEFT URETERAL CALCULI  PROCEDURE:  Procedure(s) (LRB) with comments: URETEROSCOPY (Left) -  STONE MANIPULATION, stone obtained  [phone] [employer] MCR CYSTOSCOPY WITH STENT REPLACEMENT (Left)  SURGEON:  Surgeon(s) and Role:    * Sebastian Ache, MD - Primary  PHYSICIAN ASSISTANT:   ASSISTANTS: none   ANESTHESIA:   general  EBL:  Total I/O In: 100 [I.V.:100] Out: -   BLOOD ADMINISTERED:none  DRAINS: none   LOCAL MEDICATIONS USED:  NONE  SPECIMEN:  Source of Specimen:  left ureter - stone  DISPOSITION OF SPECIMEN:  [facility] for compositional analysis  COUNTS:  YES  TOURNIQUET:  * No tourniquets in log *  DICTATION: .Other Dictation: Dictation Number [id number]  PLAN OF CARE: Discharge to home after PACU  PATIENT DISPOSITION:  PACU - hemodynamically stable.   Delay start of Pharmacological VTE agent (>24hrs) due to surgical blood loss or risk of bleeding: not applicable

## 2012-03-11 NOTE — Transfer of Care (Signed)
Immediate Anesthesia Transfer of Care Note  Patient: Devin White  Procedure(s) Performed: Procedure(s) (LRB): URETEROSCOPY (Left) CYSTOSCOPY WITH STENT REPLACEMENT (Left)  Patient Location: Patient transported to PACU with oxygen via face mask at 4 Liters / Min  Anesthesia Type: General  Level of Consciousness: awake and alert   Airway & Oxygen Therapy: Patient Spontanous Breathing and Patient connected to face mask oxygen  Post-op Assessment: Report given to PACU RN and Post -op Vital signs reviewed and stable  Post vital signs: Reviewed and stable  Dentition: Teeth and oropharynx remain in pre-op condition  Complications: No apparent anesthesia complications

## 2012-03-11 NOTE — Anesthesia Preprocedure Evaluation (Signed)
Anesthesia Evaluation  Patient identified by MRN, date of birth, ID band Patient awake    Reviewed: Allergy & Precautions, H&P , NPO status , Patient's Chart, lab work & pertinent test results, reviewed documented beta blocker date and time   Airway Mallampati: II TM Distance: >3 FB Neck ROM: full    Dental No notable dental hx. (+) Teeth Intact and Dental Advisory Given   Pulmonary neg pulmonary ROS,  breath sounds clear to auscultation  Pulmonary exam normal       Cardiovascular Exercise Tolerance: Good hypertension, Pt. on home beta blockers negative cardio ROS  Rhythm:regular Rate:Normal     Neuro/Psych negative neurological ROS  negative psych ROS   GI/Hepatic GERD-  Medicated,Liver transplant   Endo/Other  negative endocrine ROS  Renal/GU CRT 1.62  negative genitourinary   Musculoskeletal   Abdominal   Peds  Hematology negative hematology ROS (+)   Anesthesia Other Findings   Reproductive/Obstetrics negative OB ROS                           Anesthesia Physical Anesthesia Plan  ASA: III  Anesthesia Plan: General   Post-op Pain Management:    Induction: Intravenous  Airway Management Planned: LMA  Additional Equipment:   Intra-op Plan:   Post-operative Plan: Extubation in OR  Informed Consent: I have reviewed the patients History and Physical, chart, labs and discussed the procedure including the risks, benefits and alternatives for the proposed anesthesia with the patient or authorized representative who has indicated his/her understanding and acceptance.   Dental advisory given  Plan Discussed with: CRNA  Anesthesia Plan Comments:         Anesthesia Quick Evaluation

## 2012-03-11 NOTE — Op Note (Signed)
NAMEMAYEN, ALSMAN NO.:  [id number]  MEDICAL RECORD NO.:  [id number]  LOCATION:  [facility]                         FACILITY:  [facility]  PHYSICIAN:  Sebastian Ache, MD     DATE OF BIRTH:  March 26, 1956  DATE OF PROCEDURE:  03/11/2012 DATE OF DISCHARGE:  03/05/2012                              OPERATIVE REPORT   DIAGNOSIS:  Left ureteral stone.  PROCEDURE: 1. Left ureteroscopic basketing of the stone. 2. Left retrograde pyelogram interpretation. 3. Exchange of left ureteral stent 6 x 26 with tether.  FINDINGS: 1. Mid left ureteral stone, without hydroureteronephrosis. 2. No additional calcifications noted within the left kidney or     ureter.  SPECIMEN:  Left ureteral stone for composition analysis.  DRAINS:  None.  COMPLICATIONS:  None.  ESTIMATED BLOOD LOSS:  Nil.  INDICATION:  Mr. Mobbs is a very pleasant, 56 year old gentleman with history of a liver transplant and recurrent nephrolithiasis.  He had a episode of left renal colic in [facility] several weeks ago and was found to have a stone and concomitant UTI.  He presented to our practice with this constellation of symptoms and is felt that urgent renal decompression was warranted as such.  She underwent left ureteral stenting on September 19.  Cultures obtained for this procedure were negative.  He now presents for his definitive stone management with ureteroscopy.  Informed consent was obtained and placed in the medical record.  PROCEDURE IN DETAIL:  The patient being Jamair Noftz procedure being left ureteroscopic manipulation for a stage bilateral stenting was confirmed.  The procedure was carried out.  Time-out was performed. Intravenous antibiotics were administered.  General LMA anesthesia was introduced.  The patient was placed into a low lithotomy position. Sterile field was created by prepping and draping the patient's penis, perineum, and proximal thighs using iodine x3.  Next,  cystourethroscopy was performed using a 22-French rigid cystoscope with 12-degrees offset lens.  Inspection of the anterior and posterior urethra were unremarkable.  Inspection of the urinary bladder revealed distal end of the left ureteral stent in situ with expected periureteral edema as this was grasped and brushed through the meatus through which a 0.038 Glidewire was advanced to the level of the upper pole.  The stent was then exchanged for a 6-French open-ended catheter and left retrograde pyelogram seen.  Left retrograde pyelogram demonstrated single left ureter and single system left kidney.  There was a single filling defect in the mid ureter consistent with known stone.  The Glidewire was readvanced to the upper pole and set aside as a safety wire.  The cystoscope was then exchanged for the 6.5-French semi-rigid ureteroscope and using normal saline under pressure, alongside an 8-French feeding tube in urinary bladder for pressure release.  Semi-rigid ureteroscopy was performed of the ureter and single calcification was found at the level of the mid ureter.  This was grasped and engaged with an escape type-basket and brought out in its entirety and set aside for composition analysis.  To verify no stones were above this, a Sensor wire was advanced as a working wire over which the 8-French digital flexible ureteroscope was advanced at the level of the mid  ureter.  The flexible ureteroscopy was then performed of the proximal ureter as well as the entire left collecting system.  No additional calcifications were encountered.  No mucosal abnormalities were seen.  Repeat endoscopic examination of the entire length of the ureter revealed no mucosal abnormalities or residual calcification.  As such, a new 6 x 26 double-J stent was placed over the remaining safety wire using cystoscopic and fluoroscopic guidance.  Good proximal distal curl were noted.  Efflux of urine was seen around at  the distal end of the stent.  The tether was left in place and fashioned the dorsum of the penis.  Bladder was emptied per cystoscope.  The procedure was then terminated.  The patient tolerated the procedure well.  There were no immediate periprocedural complications.  The patient was taken to the Postanesthesia Care Unit in stable condition.          ______________________________ Sebastian Ache, MD     TM/MEDQ  D:  03/11/2012  T:  03/11/2012  Job:  [id number]

## 2012-03-11 NOTE — H&P (Signed)
Devin White is an 56 y.o. male.   Chief Complaint: pre-op left ureteroscopy for stone HPI: 1 - Left Ureteral Stone - Left mid ureteral stone by CT at [facility] last week. AT time of DX there was concern for concomitant UTI, though never febrile. Pt given trial of medical expulstive therapy, but pain has persisted. UA yesterday with persistant pyuria. Given pt post-liver transplant on immunosupression we felt that definitive stone management v. Staged approach with stenting first for renal decompression was warranted. LFT's wnl. Cr 1.59.  He underwent left ureteral stenting on 9/19 at which time purulent v. proteinaceous debris was seen coming from kidney, therefor ureteroscopy not performed at that time. UCX of the fluid negative. He now presents for re-attempt ureteroscopy not that urine is sterile by all measures.  PMH sig for LIVER TRANSPLANT 2009 on prograf (no steroids), No CV disease, No strong blood thinners  Past Medical History  Diagnosis Date  . GERD (gastroesophageal reflux disease)   . Gout   . Benign prostatic hypertrophy   . Hypertension   . History of blood transfusion     pt has antibodies in his blood since previous transfusions  . History of cirrhosis of liver S/P TRANSPLANT 2009  . History of liver failure S/P TRANSPLANT  . Left ureteral calculus     Past Surgical History  Procedure Date  . Removal of fistula   . Lumbar disc surgery   . Lumbar fusion   . Liver biopsy   . Liver transplant 11/24/2007    pt states doing well since liver transplant  . Bone morrow biopsy   . Cholecystectomy 2007  . Hernia repair 2008  . Cysto/ left retrograde pyelogram/ left ureteral stent placement 03-05-2012  DR Berneice Heinrich [facility])    LEFT URETERAL CALCULI    Family History  Problem Relation Age of Onset  . Cancer Mother     colon  . Arthritis Mother   . Vasculitis Mother   . Hypertension Mother   . Cancer Father     colon  . Kidney disease Father   . Arthritis Father   .  Alcohol abuse Maternal Aunt   . Diabetes Maternal Aunt   . Alcohol abuse Maternal Uncle   . Diabetes Paternal Aunt   . Alcohol abuse Maternal Uncle   . Alcohol abuse Maternal Uncle   . Arthritis Brother    Social History:  reports that he has never smoked. He has never used smokeless tobacco. He reports that he does not drink alcohol or use illicit drugs.  Allergies: No Known Allergies  Medications Prior to Admission  Medication Sig Dispense Refill  . aspirin EC 81 MG tablet Take 81 mg by mouth daily.      . ciprofloxacin (CIPRO) 500 MG tablet Take 1 tablet (500 mg total) by mouth 2 (two) times daily.  20 tablet  0  . colchicine 0.6 MG tablet Take 1 tablet (0.6 mg total) by mouth 2 (two) times daily.  60 tablet  11  . metoprolol succinate (TOPROL-XL) 50 MG 24 hr tablet Take 50 mg by mouth daily. Take with or immediately following a meal.      . omeprazole (PRILOSEC) 20 MG capsule Take 20 mg by mouth 2 (two) times daily. Pt states only taking one in the am      . oxyCODONE (OXY IR/ROXICODONE) 5 MG immediate release tablet Take 1 tablet (5 mg total) by mouth every 4 (four) hours as needed for pain.  30  tablet  0  . tacrolimus (PROGRAF) 1 MG capsule Take 1 mg by mouth 2 (two) times daily.       . Tamsulosin HCl (FLOMAX) 0.4 MG CAPS Take 1 capsule (0.4 mg total) by mouth daily.  30 capsule  11    No results found for this or any previous visit (from the past 48 hour(s)). No results found.  Review of Systems  Constitutional: Negative.  Negative for fever and chills.  HENT: Negative.   Eyes: Negative.   Respiratory: Negative.   Cardiovascular: Negative.   Gastrointestinal: Negative.   Genitourinary: Positive for flank pain.  Musculoskeletal: Negative.   Skin: Negative.   Neurological: Negative.   Endo/Heme/Allergies: Negative.   Psychiatric/Behavioral: Negative.     Height 6' (1.829 m), weight 113.399 kg (250 lb). Physical Exam  Constitutional: He appears well-developed and  well-nourished.  HENT:  Head: Normocephalic and atraumatic.  Eyes: Pupils are equal, round, and reactive to light.  Neck: Normal range of motion. Neck supple.  Cardiovascular: Normal rate.   Respiratory: Effort normal.  GI: Soft. Bowel sounds are normal.       Old incision c/w liver transplant  Genitourinary: Penis normal.  Musculoskeletal: Normal range of motion.  Neurological: He is alert.  Skin: Skin is warm and dry.  Psychiatric: He has a normal mood and affect. His behavior is normal. Judgment and thought content normal.     Assessment/Plan 1 - Left Ureteral Stone - Proceed with left ureteroscopic stone manipulation.  We previously discussed risks / benefits and these were reinforced again today.  Madilynne Mullan 03/11/2012, 7:15 AM

## 2012-03-11 NOTE — Anesthesia Procedure Notes (Signed)
Procedure Name: LMA Insertion Date/Time: 03/11/2012 8:26 AM Performed by: Fran Lowes Pre-anesthesia Checklist: Patient identified, Emergency Drugs available, Suction available and Patient being monitored Patient Re-evaluated:Patient Re-evaluated prior to inductionOxygen Delivery Method: Circle System Utilized Preoxygenation: Pre-oxygenation with 100% oxygen Intubation Type: IV induction Ventilation: Mask ventilation without difficulty LMA: LMA inserted LMA Size: 4.0 Number of attempts: 1 Airway Equipment and Method: bite block Placement Confirmation: positive ETCO2 Tube secured with: Tape Dental Injury: Teeth and Oropharynx as per pre-operative assessment

## 2012-03-11 NOTE — Anesthesia Postprocedure Evaluation (Signed)
Anesthesia Post Note  Patient: Devin White  Procedure(s) Performed: Procedure(s) (LRB): URETEROSCOPY (Left) CYSTOSCOPY WITH STENT REPLACEMENT (Left)  Anesthesia type: General  Patient location: PACU  Post pain: Pain level controlled  Post assessment: Post-op Vital signs reviewed  Last Vitals:  Filed Vitals:   03/11/12 0943  BP: 142/88  Pulse: 53  Temp:   Resp: 14    Post vital signs: Reviewed  Level of consciousness: sedated  Complications: No apparent anesthesia complications

## 2012-03-12 ENCOUNTER — Encounter (HOSPITAL_BASED_OUTPATIENT_CLINIC_OR_DEPARTMENT_OTHER): Payer: Self-pay | Admitting: Urology

## 2012-05-22 ENCOUNTER — Ambulatory Visit (INDEPENDENT_AMBULATORY_CARE_PROVIDER_SITE_OTHER): Payer: Medicare Other | Admitting: Internal Medicine

## 2012-05-22 ENCOUNTER — Encounter: Payer: Self-pay | Admitting: Internal Medicine

## 2012-05-22 ENCOUNTER — Other Ambulatory Visit: Payer: Self-pay | Admitting: *Deleted

## 2012-05-22 VITALS — BP 132/82 | HR 76 | Temp 97.7°F | Ht 73.0 in | Wt 258.0 lb

## 2012-05-22 DIAGNOSIS — N400 Enlarged prostate without lower urinary tract symptoms: Secondary | ICD-10-CM

## 2012-05-22 DIAGNOSIS — Z0000 Encounter for general adult medical examination without abnormal findings: Secondary | ICD-10-CM

## 2012-05-22 DIAGNOSIS — I10 Essential (primary) hypertension: Secondary | ICD-10-CM

## 2012-05-22 DIAGNOSIS — K7210 Chronic hepatic failure without coma: Secondary | ICD-10-CM

## 2012-05-22 DIAGNOSIS — K769 Liver disease, unspecified: Secondary | ICD-10-CM

## 2012-05-22 DIAGNOSIS — M109 Gout, unspecified: Secondary | ICD-10-CM

## 2012-05-22 DIAGNOSIS — Z23 Encounter for immunization: Secondary | ICD-10-CM

## 2012-05-22 LAB — LDL CHOLESTEROL, DIRECT: Direct LDL: 87.7 mg/dL

## 2012-05-22 LAB — LIPID PANEL
Cholesterol: 158 mg/dL (ref 0–200)
Triglycerides: 284 mg/dL — ABNORMAL HIGH (ref 0.0–149.0)
VLDL: 56.8 mg/dL — ABNORMAL HIGH (ref 0.0–40.0)

## 2012-05-22 MED ORDER — OMEPRAZOLE 20 MG PO CPDR: 20.0000 mg | DELAYED_RELEASE_CAPSULE | Freq: Two times a day (BID) | ORAL | Status: DC

## 2012-05-22 MED ORDER — COLCHICINE 0.6 MG PO TABS: 0.6000 mg | ORAL_TABLET | Freq: Two times a day (BID) | ORAL | Status: DC

## 2012-05-22 NOTE — Progress Notes (Signed)
  Subjective:    Patient ID: Devin White, male    DOB: [birth year], 56 y.o.   MRN: [id number]  HPI Here for physical  Had stent to remove stone and it is supposed to be gone Calcium stone Still has some inguinal discomfort on left Concerned about hernia  No other concerns Blood work done regularly but missed last time  Review of Systems  Constitutional: Positive for diaphoresis. Negative for fatigue and unexpected weight change.       Wears seat belt Sweats easy  HENT: Positive for tinnitus. Negative for hearing loss, congestion, rhinorrhea and dental problem.        Dry mouth in AM Regular with dentist  Eyes: Negative for visual disturbance.       No diplopia or unilateral vision loss  Respiratory: Negative for cough, chest tightness and shortness of breath.   Cardiovascular: Negative for chest pain, palpitations and leg swelling.       Walks for exercise regularly  Gastrointestinal: Positive for abdominal pain. Negative for nausea, vomiting, constipation and blood in stool.       Occ abd cramps No heartburn on meds  Genitourinary: Negative for urgency, frequency and difficulty urinating.       No sexual problems  Musculoskeletal: Positive for arthralgias. Negative for back pain and joint swelling.       CMC joints hurt at times in both hands and left>right knee  Skin: Negative for rash.       No suspicious areas  Neurological: Positive for numbness and headaches. Negative for dizziness, syncope and light-headedness.       Gets headaches---tylenol helps Occ numbness or tingling in left arm  Hematological: Negative for adenopathy. Does not bruise/bleed easily.  Psychiatric/Behavioral: Negative for sleep disturbance and dysphoric mood. The patient is not nervous/anxious.        Objective:   Physical Exam  Constitutional: He is oriented to person, place, and time. He appears well-developed and well-nourished. No distress.  HENT:  Head: Normocephalic and atraumatic.   Right Ear: External ear normal.  Left Ear: External ear normal.  Mouth/Throat: Oropharynx is clear and moist. No oropharyngeal exudate.  Eyes: Conjunctivae normal and EOM are normal. Pupils are equal, round, and reactive to light.  Neck: Normal range of motion. Neck supple. No thyromegaly present.  Cardiovascular: Normal rate, regular rhythm, normal heart sounds and intact distal pulses.  Exam reveals no gallop.   No murmur heard. Pulmonary/Chest: Effort normal and breath sounds normal. No respiratory distress. He has no wheezes. He has no rales.  Abdominal: Soft. There is no tenderness.  Musculoskeletal: He exhibits no edema and no tenderness.  Lymphadenopathy:    He has no cervical adenopathy.  Neurological: He is alert and oriented to person, place, and time.  Skin: No rash noted. No erythema.  Psychiatric: He has a normal mood and affect. His behavior is normal.          Assessment & Plan:

## 2012-05-22 NOTE — Assessment & Plan Note (Signed)
Quiet Takes one colchicine daily

## 2012-05-22 NOTE — Assessment & Plan Note (Signed)
Continues with the transplant team

## 2012-05-22 NOTE — Assessment & Plan Note (Signed)
Voids okay on med

## 2012-05-22 NOTE — Assessment & Plan Note (Signed)
Doing okay Discussed PSA--will check every other year Flu and Tdap

## 2012-05-22 NOTE — Assessment & Plan Note (Signed)
BP Readings from Last 3 Encounters:  05/22/12 132/82  03/11/12 157/92  03/11/12 157/92   Good control Most labs with transplant team

## 2012-05-27 ENCOUNTER — Encounter: Payer: Self-pay | Admitting: *Deleted

## 2012-06-01 DIAGNOSIS — D849 Immunodeficiency, unspecified: Secondary | ICD-10-CM | POA: Insufficient documentation

## 2012-06-23 ENCOUNTER — Other Ambulatory Visit: Payer: Self-pay | Admitting: *Deleted

## 2012-06-23 MED ORDER — TAMSULOSIN HCL 0.4 MG PO CAPS: 0.4000 mg | ORAL_CAPSULE | Freq: Every day | ORAL | Status: DC

## 2012-08-12 ENCOUNTER — Encounter: Payer: Self-pay | Admitting: Internal Medicine

## 2012-08-12 ENCOUNTER — Ambulatory Visit (INDEPENDENT_AMBULATORY_CARE_PROVIDER_SITE_OTHER): Payer: Medicare Other | Admitting: Internal Medicine

## 2012-08-12 VITALS — BP 148/88 | HR 69 | Temp 97.8°F | Wt 268.0 lb

## 2012-08-12 DIAGNOSIS — M109 Gout, unspecified: Secondary | ICD-10-CM

## 2012-08-12 MED ORDER — CIPROFLOXACIN HCL 500 MG PO TABS: 500.0000 mg | ORAL_TABLET | Freq: Two times a day (BID) | ORAL | Status: DC

## 2012-08-12 MED ORDER — ALLOPURINOL 300 MG PO TABS: 300.0000 mg | ORAL_TABLET | Freq: Every day | ORAL | Status: DC

## 2012-08-12 NOTE — Progress Notes (Signed)
Subjective:    Patient ID: Devin White, male    DOB: January 14, 1956, 57 y.o.   MRN: [id number]  HPI Had another gout flare in left great toe about a week ago Then into ankle---very severe then Finally getting a little better Takes the colchicine every day--- and then went up to twice a day when bad  Now having left groin pain Had ureteral stent which he removed since then (had string) Some swelling and has a knot  Current Outpatient Prescriptions on File Prior to Visit  Medication Sig Dispense Refill  . aspirin EC 81 MG tablet Take 81 mg by mouth daily.      . colchicine 0.6 MG tablet Take 1 tablet (0.6 mg total) by mouth 2 (two) times daily.  60 tablet  11  . metoprolol succinate (TOPROL-XL) 50 MG 24 hr tablet Take 50 mg by mouth daily. Take with or immediately following a meal.      . omeprazole (PRILOSEC) 20 MG capsule Take 1 capsule (20 mg total) by mouth 2 (two) times daily. Pt states only taking one in the am  30 capsule  6  . tacrolimus (PROGRAF) 1 MG capsule Take 1 mg by mouth 2 (two) times daily.       . Tamsulosin HCl (FLOMAX) 0.4 MG CAPS Take 1 capsule (0.4 mg total) by mouth daily.  30 capsule  11   No current facility-administered medications on file prior to visit.    No Known Allergies  Past Medical History  Diagnosis Date  . GERD (gastroesophageal reflux disease)   . Gout   . Benign prostatic hypertrophy   . Hypertension   . History of blood transfusion     pt has antibodies in his blood since previous transfusions  . History of cirrhosis of liver S/P TRANSPLANT 2009  . History of liver failure S/P TRANSPLANT  . Left ureteral calculus     Past Surgical History  Procedure Laterality Date  . Removal of fistula    . Lumbar disc surgery    . Lumbar fusion    . Liver biopsy    . Liver transplant  11/24/2007    pt states doing well since liver transplant  . Bone morrow biopsy    . Cholecystectomy  2007  . Hernia repair  2008  . Cysto/ left retrograde  pyelogram/ left ureteral stent placement  03-05-2012  DR MANNY [facility])    LEFT URETERAL CALCULI  . Ureteroscopy  03/11/2012    Procedure: URETEROSCOPY;  Surgeon: Sebastian Ache, MD;  Location: [facility];  Service: Urology;  Laterality: Left;   STONE MANIPULATION, stone obtained  [phone] [facility] MCR  . Cystoscopy w/ ureteral stent placement  03/11/2012    Procedure: CYSTOSCOPY WITH STENT REPLACEMENT;  Surgeon: Sebastian Ache, MD;  Location: [facility];  Service: Urology;  Laterality: Left;    Family History  Problem Relation Age of Onset  . Cancer Mother     colon  . Arthritis Mother   . Vasculitis Mother   . Hypertension Mother   . Cancer Father     colon  . Kidney disease Father   . Arthritis Father   . Alcohol abuse Maternal Aunt   . Diabetes Maternal Aunt   . Alcohol abuse Maternal Uncle   . Diabetes Paternal Aunt   . Alcohol abuse Maternal Uncle   . Alcohol abuse Maternal Uncle   . Arthritis Brother     History   Social History  .  Marital Status: Divorced    Spouse Name: N/A    Number of Children: 3  . Years of Education: N/A   Occupational History  . disabled    Social History Main Topics  . Smoking status: Never Smoker   . Smokeless tobacco: Never Used  . Alcohol Use: No  . Drug Use: No  . Sexually Active: Not on file   Other Topics Concern  . Not on file   Social History Narrative  . No narrative on file   Review of Systems Voiding okay Slight nausea but no vomiting--may be related to colchicine     Objective:   Physical Exam  Constitutional: He appears well-developed and well-nourished. No distress.  Genitourinary:  Mild tenderness along left testis and epididymis No sig swelling or erythema No hernia  Skin:  Slight redness at left 1st MTP but not tender Left ankle quiet now          Assessment & Plan:

## 2012-08-12 NOTE — Assessment & Plan Note (Signed)
Not clear cut but does have some tenderness since the stent but no voiding problems Will try trial of cipro If pain persists, check back with urologists

## 2012-08-12 NOTE — Patient Instructions (Signed)
Please check with your transplant team that they have no objection to the allopurinol. If not, start this daily and only take the colchicine if you get a flare of the gout.  Call the urologist for reevaluation if your groin pain continues after the antibiotic

## 2012-08-12 NOTE — Assessment & Plan Note (Signed)
Still with exacerbations despite colchicine prophylaxis High uric acid Will try allopurinol instead--he will check first with transplant team

## 2012-08-17 ENCOUNTER — Other Ambulatory Visit (INDEPENDENT_AMBULATORY_CARE_PROVIDER_SITE_OTHER): Payer: Medicare Other

## 2012-08-17 DIAGNOSIS — Z944 Liver transplant status: Secondary | ICD-10-CM

## 2012-08-17 LAB — CBC WITH DIFFERENTIAL/PLATELET
Eosinophils Relative: 5.3 % — ABNORMAL HIGH (ref 0.0–5.0)
HCT: 49.5 % (ref 39.0–52.0)
Hemoglobin: 16.9 g/dL (ref 13.0–17.0)
Lymphs Abs: 1.5 10*3/uL (ref 0.7–4.0)
Monocytes Relative: 7.8 % (ref 3.0–12.0)
Neutro Abs: 3.8 10*3/uL (ref 1.4–7.7)
RDW: 13.9 % (ref 11.5–14.6)
WBC: 6.2 10*3/uL (ref 4.5–10.5)

## 2012-08-17 LAB — COMPREHENSIVE METABOLIC PANEL
CO2: 28 mEq/L (ref 19–32)
Creatinine, Ser: 1.5 mg/dL (ref 0.4–1.5)
GFR: 53.35 mL/min — ABNORMAL LOW (ref >60.00)
Glucose, Bld: 107 mg/dL — ABNORMAL HIGH (ref 70–99)
Total Bilirubin: 1.2 mg/dL (ref 0.3–1.2)

## 2012-08-17 LAB — URIC ACID: Uric Acid, Serum: 5.7 mg/dL (ref 4.0–7.8)

## 2012-08-20 ENCOUNTER — Encounter: Payer: Self-pay | Admitting: *Deleted

## 2012-08-25 ENCOUNTER — Emergency Department (HOSPITAL_COMMUNITY)
Admission: EM | Admit: 2012-08-25 | Discharge: 2012-08-26 | Disposition: A | Discharge status: Home / Self Care | Payer: Medicare Other | Attending: Emergency Medicine | Admitting: Emergency Medicine

## 2012-08-25 ENCOUNTER — Encounter (HOSPITAL_COMMUNITY): Payer: Self-pay | Admitting: *Deleted

## 2012-08-25 ENCOUNTER — Emergency Department (HOSPITAL_COMMUNITY): Payer: Medicare Other

## 2012-08-25 DIAGNOSIS — K219 Gastro-esophageal reflux disease without esophagitis: Secondary | ICD-10-CM | POA: Insufficient documentation

## 2012-08-25 DIAGNOSIS — Z7982 Long term (current) use of aspirin: Secondary | ICD-10-CM | POA: Insufficient documentation

## 2012-08-25 DIAGNOSIS — I10 Essential (primary) hypertension: Secondary | ICD-10-CM | POA: Insufficient documentation

## 2012-08-25 DIAGNOSIS — R300 Dysuria: Secondary | ICD-10-CM | POA: Insufficient documentation

## 2012-08-25 DIAGNOSIS — Z79899 Other long term (current) drug therapy: Secondary | ICD-10-CM | POA: Insufficient documentation

## 2012-08-25 DIAGNOSIS — M109 Gout, unspecified: Secondary | ICD-10-CM | POA: Insufficient documentation

## 2012-08-25 DIAGNOSIS — M79609 Pain in unspecified limb: Secondary | ICD-10-CM | POA: Insufficient documentation

## 2012-08-25 DIAGNOSIS — Z8719 Personal history of other diseases of the digestive system: Secondary | ICD-10-CM | POA: Insufficient documentation

## 2012-08-25 DIAGNOSIS — Z87442 Personal history of urinary calculi: Secondary | ICD-10-CM | POA: Insufficient documentation

## 2012-08-25 DIAGNOSIS — Z944 Liver transplant status: Secondary | ICD-10-CM | POA: Insufficient documentation

## 2012-08-25 DIAGNOSIS — N400 Enlarged prostate without lower urinary tract symptoms: Secondary | ICD-10-CM | POA: Insufficient documentation

## 2012-08-25 DIAGNOSIS — N509 Disorder of male genital organs, unspecified: Secondary | ICD-10-CM | POA: Insufficient documentation

## 2012-08-25 LAB — CBC WITH DIFFERENTIAL/PLATELET
Basophils Relative: 0 % (ref 0–1)
HCT: 46.2 % (ref 39.0–52.0)
Hemoglobin: 16.5 g/dL (ref 13.0–17.0)
Lymphocytes Relative: 29 % (ref 12–46)
Lymphs Abs: 1.8 10*3/uL (ref 0.7–4.0)
MCHC: 35.7 g/dL (ref 30.0–36.0)
Monocytes Absolute: 0.8 10*3/uL (ref 0.1–1.0)
Monocytes Relative: 14 % — ABNORMAL HIGH (ref 3–12)
Neutro Abs: 3 10*3/uL (ref 1.7–7.7)
RBC: 5.04 MIL/uL (ref 4.22–5.81)

## 2012-08-25 LAB — URINALYSIS, ROUTINE W REFLEX MICROSCOPIC
Glucose, UA: NEGATIVE mg/dL
Ketones, ur: NEGATIVE mg/dL
Leukocytes, UA: NEGATIVE
pH: 5.5 (ref 5.0–8.0)

## 2012-08-25 LAB — COMPREHENSIVE METABOLIC PANEL
ALT: 34 U/L (ref 0–53)
Albumin: 3.7 g/dL (ref 3.5–5.2)
Alkaline Phosphatase: 61 U/L (ref 39–117)
Potassium: 4.2 mEq/L (ref 3.5–5.1)
Sodium: 137 mEq/L (ref 135–145)
Total Protein: 6.9 g/dL (ref 6.0–8.3)

## 2012-08-25 MED ORDER — HYDROCODONE-ACETAMINOPHEN 5-325 MG PO TABS
1.0000 | ORAL_TABLET | Freq: Once | ORAL | Status: AC
  Administered 2012-08-25: 1 via ORAL
  Filled 2012-08-25: qty 1

## 2012-08-25 NOTE — ED Notes (Signed)
Pt has left ankle pain. Hx of gout. Pt takes allopurinol for gout.

## 2012-08-25 NOTE — ED Notes (Signed)
Pt went to [facility] in September for stent placement in left kidney. Pt got kidney stone surgically removed in September after.

## 2012-08-25 NOTE — ED Notes (Signed)
Pt c/o gout foot pain left foot; pt c/o pain left groin for months; feels swollen; had kidney stone surgery in September and has pain in this area since then; worse now; placed on antibiotics for 10 days without any improvement; states may possibly be a hernia per PCP

## 2012-08-25 NOTE — ED Notes (Signed)
Pt transported to [facility]

## 2012-08-25 NOTE — ED Notes (Signed)
Pt reports liver transplant 5 years ago (pt had fatty liver, never drank, did not have hepatitis) and inguinal hernia repair 6 years prior.

## 2012-08-25 NOTE — ED Provider Notes (Signed)
History     CSN: [id number]  Arrival date & time 08/25/12  1950   First MD Initiated Contact with Patient 08/25/12 2137      Chief Complaint  Patient presents with  . Groin Pain  . Foot Pain    (Consider location/radiation/quality/duration/timing/severity/associated sxs/prior treatment) HPI History provided by pt.   Pt has had pain in his left testicle since having a kidney stone surgically removed in 02/2012.  Pain a constant burning and pulling sensation that is aggravated by ROM of LLE.  His PCP examined him in December, testicle edematous at that time and was started on abx.  Was re-evaluated last week because sx unchanged and started on a second round of abx.  Pain seems to be worsening despite compliance.  Associated w/ dysuria.  Denies fever, abd pain, N/V/D, urethral discharge.  Denies trauma.   Past Medical History  Diagnosis Date  . GERD (gastroesophageal reflux disease)   . Gout   . Benign prostatic hypertrophy   . Hypertension   . History of blood transfusion     pt has antibodies in his blood since previous transfusions  . History of cirrhosis of liver S/P TRANSPLANT 2009  . History of liver failure S/P TRANSPLANT  . Left ureteral calculus     Past Surgical History  Procedure Laterality Date  . Removal of fistula    . Lumbar disc surgery    . Lumbar fusion    . Liver biopsy    . Liver transplant  11/24/2007    pt states doing well since liver transplant  . Bone morrow biopsy    . Cholecystectomy  2007  . Hernia repair  2008  . Cysto/ left retrograde pyelogram/ left ureteral stent placement  03-05-2012  DR MANNY [facility])    LEFT URETERAL CALCULI  . Ureteroscopy  03/11/2012    Procedure: URETEROSCOPY;  Surgeon: Sebastian Ache, MD;  Location: [facility];  Service: Urology;  Laterality: Left;   STONE MANIPULATION, stone obtained  [id number] [facility] MCR  . Cystoscopy w/ ureteral stent placement  03/11/2012    Procedure: CYSTOSCOPY WITH STENT REPLACEMENT;   Surgeon: Sebastian Ache, MD;  Location: [facility];  Service: Urology;  Laterality: Left;    Family History  Problem Relation Age of Onset  . Cancer Mother     colon  . Arthritis Mother   . Vasculitis Mother   . Hypertension Mother   . Cancer Father     colon  . Kidney disease Father   . Arthritis Father   . Alcohol abuse Maternal Aunt   . Diabetes Maternal Aunt   . Alcohol abuse Maternal Uncle   . Diabetes Paternal Aunt   . Alcohol abuse Maternal Uncle   . Alcohol abuse Maternal Uncle   . Arthritis Brother     History  Substance Use Topics  . Smoking status: Never Smoker   . Smokeless tobacco: Never Used  . Alcohol Use: No      Review of Systems  All other systems reviewed and are negative.    Allergies  Review of patient's allergies indicates no known allergies.  Home Medications   Current Outpatient Rx  Name  Route  Sig  Dispense  Refill  . allopurinol (ZYLOPRIM) 300 MG tablet   Oral   Take 1 tablet (300 mg total) by mouth daily.   30 tablet   11   . aspirin EC 81 MG tablet   Oral   Take 81 mg by  mouth daily.         . colchicine 0.6 MG tablet   Oral   Take 0.6 mg by mouth 2 (two) times daily as needed. For gout flare         . metoprolol succinate (TOPROL-XL) 50 MG 24 hr tablet   Oral   Take 50 mg by mouth daily. Take with or immediately following a meal.         . omeprazole (PRILOSEC) 20 MG capsule   Oral   Take 1 capsule (20 mg total) by mouth 2 (two) times daily. Pt states only taking one in the am   30 capsule   6   . tacrolimus (PROGRAF) 1 MG capsule   Oral   Take 1 mg by mouth 2 (two) times daily.          . Tamsulosin HCl (FLOMAX) 0.4 MG CAPS   Oral   Take 1 capsule (0.4 mg total) by mouth daily.   30 capsule   11     BP 143/83  Pulse 68  Temp(Src) 98.5 F (36.9 C) (Oral)  Resp 20  SpO2 99%  Physical Exam  Nursing note and vitals reviewed. Constitutional: He is oriented to person, place, and  time. He appears well-developed and well-nourished. No distress.  HENT:  Head: Normocephalic and atraumatic.  Eyes:  Normal appearance  Neck: Normal range of motion.  Cardiovascular: Normal rate and regular rhythm.   Pulmonary/Chest: Effort normal and breath sounds normal. No respiratory distress.  Abdominal: Soft. Bowel sounds are normal. He exhibits no distension and no mass. There is no tenderness. There is no rebound and no guarding.  Genitourinary:  No genitalia rash.  No penile discharge.  Testicles descended bilaterally.  No masses.  Mild L testicular tenderness.  No hernia.    Musculoskeletal: Normal range of motion.  No tenderness of L hip or groin.  Pain in testicle is reproduced w/ passive ROM of hip.   Neurological: He is alert and oriented to person, place, and time.  Skin: Skin is warm and dry. No rash noted.  Psychiatric: He has a normal mood and affect. His behavior is normal.    ED Course  Procedures (including critical care time)  Labs Reviewed  COMPREHENSIVE METABOLIC PANEL - Abnormal; Notable for the following:    Glucose, Bld 106 (*)    GFR calc non Af Amer 62 (*)    GFR calc Af Amer 72 (*)    All other components within normal limits  CBC WITH DIFFERENTIAL - Abnormal; Notable for the following:    Platelets 131 (*)    Monocytes Relative 14 (*)    Eosinophils Relative 7 (*)    All other components within normal limits  CBC WITH DIFFERENTIAL  URINALYSIS, ROUTINE W REFLEX MICROSCOPIC   [place] Art/ven Flow Abd Pelv Doppler  08/26/2012  *RADIOLOGY REPORT*  Clinical Data: Groin pain  ULTRASOUND OF SCROTUM  Technique:  Complete ultrasound examination of the testicles, epididymis, and other scrotal structures was performed.  Comparison:  None.  Findings:  Right testis:  Mildly heterogeneous in echogenicity. No focal lesion identified.  Measures 4.1 x 2.4 x 2.9 cm.  Color Doppler flow with arterial and venous wave forms documented.  Left testis:  Normal echogenicity.   Measures 4.5 x 2.5 x 2.8 cm. Color Doppler flow with arterial and venous wave forms documented.  Right epididymis:  Normal in size and appearance.  Left epididymis:  Normal in size and appearance. Tiny epididymal  cyst.  Hydrocele:  Absent  Varicocele:  Absent  IMPRESSION: Mild heterogeneity of the right testicle is nonspecific, perhaps sequelae of prior inflammation or infection.  No focal lesion identified.  Color Doppler flow with arterial and venous wave forms documented bilaterally.   Original Report Authenticated By: Jearld Lesch, M.D.      1. Testicular pain       MDM  [phone] M presents w/ non-traumatic L testicular pain that started after L kidney stone removal in 02/2012.  Mild tenderness of L testicle but otherwise normal genitalia on exam.  Scrotal US pending.  Pt has received vicodin for pain.    [place] negative for acute pathology. Results discussed w/ pt.  Pain improved w/ vicodin but he requests another dose.  Will d/c home w/ same and advise f/u with his urologist and return for worsening pain.  2:27 AM         Otilio Miu, PA-C 08/26/12 [phone]

## 2012-08-25 NOTE — ED Notes (Signed)
Pt able to ambulate, but with discomfort. Pt reports left testicle swelling since they took out stent in the first week of October.

## 2012-08-26 MED ORDER — HYDROCODONE-ACETAMINOPHEN 5-325 MG PO TABS: 1.0000 | ORAL_TABLET | ORAL | Status: DC | PRN

## 2012-08-26 MED ORDER — HYDROCODONE-ACETAMINOPHEN 5-325 MG PO TABS
1.0000 | ORAL_TABLET | Freq: Once | ORAL | Status: AC
  Administered 2012-08-26: 1 via ORAL
  Filled 2012-08-26: qty 1

## 2012-08-29 NOTE — ED Provider Notes (Signed)
Medical screening examination/treatment/procedure(s) were performed by non-physician practitioner and as supervising physician I was immediately available for consultation/collaboration.  Anthony T Allen, MD 08/29/12 1643 

## 2012-10-16 ENCOUNTER — Emergency Department: Payer: Self-pay | Admitting: Emergency Medicine

## 2012-10-16 LAB — COMPREHENSIVE METABOLIC PANEL
Albumin: 4.1 g/dL (ref 3.4–5.0)
Alkaline Phosphatase: 70 U/L (ref 50–136)
Anion Gap: 5 — ABNORMAL LOW (ref 7–16)
BUN: 16 mg/dL (ref 7–18)
Bilirubin,Total: 1.2 mg/dL — ABNORMAL HIGH (ref 0.2–1.0)
Co2: 29 mmol/L (ref 21–32)
Creatinine: 1.33 mg/dL — ABNORMAL HIGH (ref 0.60–1.30)
EGFR (Non-African Amer.): >60
Glucose: 97 mg/dL (ref 65–99)
Osmolality: 282 (ref 275–301)
Potassium: 4 mmol/L (ref 3.5–5.1)
SGOT(AST): 40 U/L — ABNORMAL HIGH (ref 15–37)
SGPT (ALT): 62 U/L (ref 12–78)
Total Protein: 7.1 g/dL (ref 6.4–8.2)

## 2012-10-16 LAB — CBC WITH DIFFERENTIAL/PLATELET
Basophil #: 0 10*3/uL (ref 0.0–0.1)
Eosinophil #: 0.3 10*3/uL (ref 0.0–0.7)
Eosinophil %: 3.7 %
HGB: 17.1 g/dL (ref 13.0–18.0)
MCH: 32.7 pg (ref 26.0–34.0)
MCHC: 34.7 g/dL (ref 32.0–36.0)
Monocyte %: 10.6 %
Neutrophil #: 5.3 10*3/uL (ref 1.4–6.5)
Neutrophil %: 64.5 %
Platelet: 137 10*3/uL — ABNORMAL LOW (ref 150–440)
RBC: 5.25 10*6/uL (ref 4.40–5.90)
RDW: 14 % (ref 11.5–14.5)
WBC: 8.3 10*3/uL (ref 3.8–10.6)

## 2012-10-16 LAB — LIPASE, BLOOD: Lipase: 108 U/L (ref 73–393)

## 2012-10-16 LAB — URINALYSIS, COMPLETE
Bacteria: NONE SEEN
Bilirubin,UR: NEGATIVE
Glucose,UR: NEGATIVE mg/dL (ref 0–75)
Nitrite: NEGATIVE
Ph: 5 (ref 4.5–8.0)
Protein: NEGATIVE
RBC,UR: 1 /HPF (ref 0–5)
Specific Gravity: 1.015 (ref 1.003–1.030)
Squamous Epithelial: <1

## 2012-10-20 ENCOUNTER — Other Ambulatory Visit: Payer: Self-pay | Admitting: *Deleted

## 2012-10-20 MED ORDER — METOPROLOL SUCCINATE ER 50 MG PO TB24: 50.0000 mg | ORAL_TABLET | Freq: Every day | ORAL | Status: DC

## 2013-02-09 ENCOUNTER — Ambulatory Visit (INDEPENDENT_AMBULATORY_CARE_PROVIDER_SITE_OTHER): Payer: Medicare Other | Admitting: Internal Medicine

## 2013-02-09 ENCOUNTER — Encounter: Payer: Self-pay | Admitting: Internal Medicine

## 2013-02-09 ENCOUNTER — Telehealth: Payer: Self-pay | Admitting: Radiology

## 2013-02-09 ENCOUNTER — Ambulatory Visit (INDEPENDENT_AMBULATORY_CARE_PROVIDER_SITE_OTHER)
Admission: RE | Admit: 2013-02-09 | Discharge: 2013-02-09 | Disposition: A | Discharge status: Home / Self Care | Payer: Medicare Other | Source: Ambulatory Visit | Attending: Internal Medicine | Admitting: Internal Medicine

## 2013-02-09 VITALS — BP 130/90 | HR 95 | Temp 98.3°F | Wt 276.0 lb

## 2013-02-09 DIAGNOSIS — K769 Liver disease, unspecified: Secondary | ICD-10-CM

## 2013-02-09 DIAGNOSIS — N509 Disorder of male genital organs, unspecified: Secondary | ICD-10-CM

## 2013-02-09 DIAGNOSIS — N400 Enlarged prostate without lower urinary tract symptoms: Secondary | ICD-10-CM

## 2013-02-09 DIAGNOSIS — K7210 Chronic hepatic failure without coma: Secondary | ICD-10-CM

## 2013-02-09 DIAGNOSIS — I10 Essential (primary) hypertension: Secondary | ICD-10-CM

## 2013-02-09 DIAGNOSIS — N5082 Scrotal pain: Secondary | ICD-10-CM

## 2013-02-09 LAB — CBC WITH DIFFERENTIAL/PLATELET
Eosinophils Absolute: 0.5 10*3/uL (ref 0.0–0.7)
Lymphs Abs: 1.4 10*3/uL (ref 0.7–4.0)
MCHC: 34.5 g/dL (ref 30.0–36.0)
MCV: 95.5 fl (ref 78.0–100.0)
Monocytes Absolute: 0.8 10*3/uL (ref 0.1–1.0)
Neutrophils Relative %: 68.8 % (ref 43.0–77.0)
Platelets: 145 10*3/uL — ABNORMAL LOW (ref 150.0–400.0)
RDW: 14 % (ref 11.5–14.6)

## 2013-02-09 LAB — TSH: TSH: 5.55 u[IU]/mL — ABNORMAL HIGH (ref 0.35–5.50)

## 2013-02-09 LAB — HEPATIC FUNCTION PANEL
Albumin: 4.2 g/dL (ref 3.5–5.2)
Total Bilirubin: 2.3 mg/dL — ABNORMAL HIGH (ref 0.3–1.2)

## 2013-02-09 LAB — BASIC METABOLIC PANEL
BUN: 12 mg/dL (ref 6–23)
CO2: 29 mEq/L (ref 19–32)
Chloride: 102 mEq/L (ref 96–112)
Creatinine, Ser: 1.6 mg/dL — ABNORMAL HIGH (ref 0.4–1.5)
Glucose, Bld: 114 mg/dL — ABNORMAL HIGH (ref 70–99)

## 2013-02-09 NOTE — Assessment & Plan Note (Signed)
May be part of his symptoms Urology eval

## 2013-02-09 NOTE — Telephone Encounter (Signed)
[facility] lab called a critical HGB result - 18.4

## 2013-02-09 NOTE — Assessment & Plan Note (Signed)
PE suggested pulmonary effusions but CXR shows elevated diaphragms but no fluid Hopefully all is well Labs done

## 2013-02-09 NOTE — Assessment & Plan Note (Signed)
BP Readings from Last 3 Encounters:  02/09/13 130/90  08/26/12 141/97  08/12/12 148/88   Good control

## 2013-02-09 NOTE — Progress Notes (Signed)
Subjective:    Patient ID: Devin White, male    DOB: 08/17/1955, 57 y.o.   MRN: [id number]  HPI Has had recurrent flaring of the scrotal pain This has been intermittent Seen at [facility] several months ago and given antibiotic then also Never completely clears up  Saw another kidney stone when he went to [facility] Not sure what side it is on Some pain occasionally---never quite felt right since stent Not excited about care at [facility] Has seen Dr Achilles Dunk but he doesn't have appts for 3 months  Sweats a lot Feels like "I have an infection" Gets sense of swelling in abdomen  No chest pain Some DOE but not striking Mostly has problems after eating with nausea and pressure  Current Outpatient Prescriptions on File Prior to Visit  Medication Sig Dispense Refill  . allopurinol (ZYLOPRIM) 300 MG tablet Take 1 tablet (300 mg total) by mouth daily.  30 tablet  11  . aspirin EC 81 MG tablet Take 81 mg by mouth daily.      . colchicine 0.6 MG tablet Take 0.6 mg by mouth 2 (two) times daily as needed. For gout flare      . HYDROcodone-acetaminophen (NORCO/VICODIN) 5-325 MG per tablet Take 1 tablet by mouth every 4 (four) hours as needed for pain.  20 tablet  0  . metoprolol succinate (TOPROL-XL) 50 MG 24 hr tablet Take 1 tablet (50 mg total) by mouth daily. Take with or immediately following a meal.  30 tablet  6  . omeprazole (PRILOSEC) 20 MG capsule Take 1 capsule (20 mg total) by mouth 2 (two) times daily. Pt states only taking one in the am  30 capsule  6  . tacrolimus (PROGRAF) 1 MG capsule Take 1 mg by mouth 2 (two) times daily.       . Tamsulosin HCl (FLOMAX) 0.4 MG CAPS Take 1 capsule (0.4 mg total) by mouth daily.  30 capsule  11   No current facility-administered medications on file prior to visit.    No Known Allergies  Past Medical History  Diagnosis Date  . GERD (gastroesophageal reflux disease)   . Gout   . Benign prostatic hypertrophy   . Hypertension   . History of blood  transfusion     pt has antibodies in his blood since previous transfusions  . History of cirrhosis of liver S/P TRANSPLANT 2009  . History of liver failure S/P TRANSPLANT  . Left ureteral calculus     Past Surgical History  Procedure Laterality Date  . Removal of fistula    . Lumbar disc surgery    . Lumbar fusion    . Liver biopsy    . Liver transplant  11/24/2007    pt states doing well since liver transplant  . Bone morrow biopsy    . Cholecystectomy  2007  . Hernia repair  2008  . Cysto/ left retrograde pyelogram/ left ureteral stent placement  03-05-2012  DR MANNY [facility])    LEFT URETERAL CALCULI  . Ureteroscopy  03/11/2012    Procedure: URETEROSCOPY;  Surgeon: Sebastian Ache, MD;  Location: [facility];  Service: Urology;  Laterality: Left;   STONE MANIPULATION, stone obtained  [phone] [facility] MCR  . Cystoscopy w/ ureteral stent placement  03/11/2012    Procedure: CYSTOSCOPY WITH STENT REPLACEMENT;  Surgeon: Sebastian Ache, MD;  Location: [facility];  Service: Urology;  Laterality: Left;    Family History  Problem Relation Age of Onset  .  Cancer Mother     colon  . Arthritis Mother   . Vasculitis Mother   . Hypertension Mother   . Cancer Father     colon  . Kidney disease Father   . Arthritis Father   . Alcohol abuse Maternal Aunt   . Diabetes Maternal Aunt   . Alcohol abuse Maternal Uncle   . Diabetes Paternal Aunt   . Alcohol abuse Maternal Uncle   . Alcohol abuse Maternal Uncle   . Arthritis Brother     History   Social History  . Marital Status: Divorced    Spouse Name: N/A    Number of Children: 3  . Years of Education: N/A   Occupational History  . disabled    Social History Main Topics  . Smoking status: Never Smoker   . Smokeless tobacco: Never Used  . Alcohol Use: No  . Drug Use: No  . Sexual Activity: Not on file   Other Topics Concern  . Not on file   Social History Narrative  . No narrative on file    Review of Systems Weight up though not eating that much--- not able to walk much lately Pain in groin limiting him    Objective:   Physical Exam  Constitutional: He appears well-developed and well-nourished. No distress.  Neck: Normal range of motion. Neck supple. No thyromegaly present.  Cardiovascular: Normal rate, regular rhythm and normal heart sounds.  Exam reveals no gallop.   No murmur heard. Pulmonary/Chest: Effort normal. No respiratory distress. He has no wheezes. He has no rales.  Dullness and decreased breath sounds at both bases  Abdominal: Soft. He exhibits no distension. There is no tenderness. There is no rebound and no guarding.  Genitourinary:  No scrotal inflammation or tenderness today  Musculoskeletal: He exhibits no edema and no tenderness.  Lymphadenopathy:    He has no cervical adenopathy.  Psychiatric: He has a normal mood and affect. His behavior is normal.          Assessment & Plan:

## 2013-02-09 NOTE — Assessment & Plan Note (Signed)
No clear infection Ongoing pain No clear signs of recurrent stone Will set up urology at [facility]---he wanted someone new

## 2013-02-10 LAB — TACROLIMUS LEVEL: Tacrolimus Lvl: 3.7 ng/mL — ABNORMAL LOW (ref 5.0–20.0)

## 2013-02-10 NOTE — Telephone Encounter (Signed)
Not sure any action is needed Will review full profile Doesn't have polycythemia

## 2013-02-12 ENCOUNTER — Other Ambulatory Visit: Payer: Self-pay | Admitting: Internal Medicine

## 2013-04-22 ENCOUNTER — Other Ambulatory Visit: Payer: Self-pay

## 2013-05-20 ENCOUNTER — Encounter: Payer: Self-pay | Admitting: Internal Medicine

## 2013-05-20 ENCOUNTER — Ambulatory Visit (INDEPENDENT_AMBULATORY_CARE_PROVIDER_SITE_OTHER): Payer: Medicare Other | Admitting: Internal Medicine

## 2013-05-20 VITALS — BP 146/94 | HR 70 | Temp 98.4°F | Ht 74.0 in | Wt 273.2 lb

## 2013-05-20 DIAGNOSIS — J019 Acute sinusitis, unspecified: Secondary | ICD-10-CM

## 2013-05-20 MED ORDER — AMOXICILLIN 500 MG PO TABS: 1000.0000 mg | ORAL_TABLET | Freq: Two times a day (BID) | ORAL | Status: DC

## 2013-05-20 NOTE — Progress Notes (Signed)
Subjective:    Patient ID: Devin White, male    DOB: 04-Feb-1956, 57 y.o.   MRN: [id number]  HPI Has head and sinus symptoms Has ear pain--popping and hearing is off Started about a week ago No fever but awakens with sweat No sore throat Very little cough Some SOB---and feels tired  Persistent diarrhea--- goes multiple time anytime he eats Thinks it may be from swallowing all the post nasal drip No nausea or abdominal pain  Has tried dayquil--no help Ear drops no help either  Current Outpatient Prescriptions on File Prior to Visit  Medication Sig Dispense Refill  . allopurinol (ZYLOPRIM) 300 MG tablet Take 1 tablet (300 mg total) by mouth daily.  30 tablet  11  . aspirin EC 81 MG tablet Take 81 mg by mouth daily.      . colchicine 0.6 MG tablet Take 0.6 mg by mouth 2 (two) times daily as needed. For gout flare      . HYDROcodone-acetaminophen (NORCO/VICODIN) 5-325 MG per tablet Take 1 tablet by mouth every 4 (four) hours as needed for pain.  20 tablet  0  . metoprolol succinate (TOPROL-XL) 50 MG 24 hr tablet Take 1 tablet (50 mg total) by mouth daily. Take with or immediately following a meal.  30 tablet  6  . omeprazole (PRILOSEC) 20 MG capsule TAKE ONE (1) CAPSULE BY MOUTH 2 TIMES DAILY  30 capsule  11  . tacrolimus (PROGRAF) 1 MG capsule Take 1 mg by mouth 2 (two) times daily.       . Tamsulosin HCl (FLOMAX) 0.4 MG CAPS Take 1 capsule (0.4 mg total) by mouth daily.  30 capsule  11   No current facility-administered medications on file prior to visit.    No Known Allergies  Past Medical History  Diagnosis Date  . GERD (gastroesophageal reflux disease)   . Gout   . Benign prostatic hypertrophy   . Hypertension   . History of blood transfusion     pt has antibodies in his blood since previous transfusions  . History of cirrhosis of liver S/P TRANSPLANT 2009  . History of liver failure S/P TRANSPLANT  . Left ureteral calculus     Past Surgical History  Procedure  Laterality Date  . Removal of fistula    . Lumbar disc surgery    . Lumbar fusion    . Liver biopsy    . Liver transplant  11/24/2007    pt states doing well since liver transplant  . Bone morrow biopsy    . Cholecystectomy  2007  . Hernia repair  2008  . Cysto/ left retrograde pyelogram/ left ureteral stent placement  03-05-2012  DR MANNY [facility])    LEFT URETERAL CALCULI  . Ureteroscopy  03/11/2012    Procedure: URETEROSCOPY;  Surgeon: Sebastian Ache, MD;  Location: [facility];  Service: Urology;  Laterality: Left;   STONE MANIPULATION, stone obtained  [phone] [facility] MCR  . Cystoscopy w/ ureteral stent placement  03/11/2012    Procedure: CYSTOSCOPY WITH STENT REPLACEMENT;  Surgeon: Sebastian Ache, MD;  Location: [facility];  Service: Urology;  Laterality: Left;    Family History  Problem Relation Age of Onset  . Cancer Mother     colon  . Arthritis Mother   . Vasculitis Mother   . Hypertension Mother   . Cancer Father     colon  . Kidney disease Father   . Arthritis Father   . Alcohol abuse  Maternal Aunt   . Diabetes Maternal Aunt   . Alcohol abuse Maternal Uncle   . Diabetes Paternal Aunt   . Alcohol abuse Maternal Uncle   . Alcohol abuse Maternal Uncle   . Arthritis Brother     History   Social History  . Marital Status: Divorced    Spouse Name: N/A    Number of Children: 3  . Years of Education: N/A   Occupational History  . disabled    Social History Main Topics  . Smoking status: Never Smoker   . Smokeless tobacco: Never Used  . Alcohol Use: No  . Drug Use: No  . Sexual Activity: Not on file   Other Topics Concern  . Not on file   Social History Narrative  . No narrative on file   Review of Systems Recent transplant visit---blood work doesn't look right. Not sure what is going on Wasn't able to get flu shot No rash    Objective:   Physical Exam  Constitutional: No distress.  Appears mildly uncomfortable  HENT:    Mild maxillary sinus tenderness Mild pharyngeal injection with slight ulcer on uvula Cerumen against left TM Right TM partially seen and not inflamed  Neck: Normal range of motion. Neck supple.  Pulmonary/Chest: Effort normal and breath sounds normal. No respiratory distress. He has no wheezes. He has no rales.  Lymphadenopathy:    He has no cervical adenopathy.  Skin:  clammy          Assessment & Plan:

## 2013-05-20 NOTE — Assessment & Plan Note (Addendum)
Mostly head and ear pressure Symptoms worsening after a week May have loose stools secondarily---abdominal exam is benign and has appetite  Will try amoxil Copy to [facility]

## 2013-05-20 NOTE — Progress Notes (Signed)
Pre-visit discussion using our clinic review tool. No additional management support is needed unless otherwise documented below in the visit note.  

## 2013-06-17 ENCOUNTER — Other Ambulatory Visit: Payer: Self-pay | Admitting: Internal Medicine

## 2013-07-19 ENCOUNTER — Other Ambulatory Visit: Payer: Self-pay | Admitting: Internal Medicine

## 2013-08-09 ENCOUNTER — Encounter: Payer: Self-pay | Admitting: Internal Medicine

## 2013-08-09 ENCOUNTER — Ambulatory Visit (INDEPENDENT_AMBULATORY_CARE_PROVIDER_SITE_OTHER): Payer: Medicare Other | Admitting: Internal Medicine

## 2013-08-09 VITALS — BP 138/84 | HR 78 | Temp 97.7°F | Wt 272.0 lb

## 2013-08-09 DIAGNOSIS — T50905A Adverse effect of unspecified drugs, medicaments and biological substances, initial encounter: Secondary | ICD-10-CM

## 2013-08-09 DIAGNOSIS — Z23 Encounter for immunization: Secondary | ICD-10-CM

## 2013-08-09 DIAGNOSIS — R197 Diarrhea, unspecified: Secondary | ICD-10-CM

## 2013-08-09 DIAGNOSIS — T887XXA Unspecified adverse effect of drug or medicament, initial encounter: Secondary | ICD-10-CM

## 2013-08-09 DIAGNOSIS — R110 Nausea: Secondary | ICD-10-CM

## 2013-08-09 MED ORDER — METHYLPREDNISOLONE ACETATE 40 MG/ML IJ SUSP
40.0000 mg | Freq: Once | INTRAMUSCULAR | Status: AC
Start: 2013-08-09 — End: 2013-08-09
  Administered 2013-08-09: 40 mg via INTRAMUSCULAR

## 2013-08-09 MED ORDER — METHYLPREDNISOLONE ACETATE 80 MG/ML IJ SUSP: 80.0000 mg | Freq: Once | INTRAMUSCULAR | Status: DC

## 2013-08-09 NOTE — Patient Instructions (Addendum)
Diarrhea Diarrhea is frequent loose and watery bowel movements. It can cause you to feel weak and dehydrated. Dehydration can cause you to become tired and thirsty, have a dry mouth, and have decreased urination that often is dark yellow. Diarrhea is a sign of another problem, most often an infection that will not last long. In most cases, diarrhea typically lasts 2 3 days. However, it can last longer if it is a sign of something more serious. It is important to treat your diarrhea as directed by your caregive to lessen or prevent future episodes of diarrhea. CAUSES  Some common causes include:  Gastrointestinal infections caused by viruses, bacteria, or parasites.  Food poisoning or food allergies.  Certain medicines, such as antibiotics, chemotherapy, and laxatives.  Artificial sweeteners and fructose.  Digestive disorders. HOME CARE INSTRUCTIONS  Ensure adequate fluid intake (hydration): have 1 cup (8 oz) of fluid for each diarrhea episode. Avoid fluids that contain simple sugars or sports drinks, fruit juices, whole milk products, and sodas. Your urine should be clear or pale yellow if you are drinking enough fluids. Hydrate with an oral rehydration solution that you can purchase at pharmacies, retail stores, and online. You can prepare an oral rehydration solution at home by mixing the following ingredients together:    tsp table salt.   tsp baking soda.   tsp salt substitute containing potassium chloride.  1  tablespoons sugar.  1 L (34 oz) of water.  Certain foods and beverages may increase the speed at which food moves through the gastrointestinal (GI) tract. These foods and beverages should be avoided and include:  Caffeinated and alcoholic beverages.  High-fiber foods, such as raw fruits and vegetables, nuts, seeds, and whole grain breads and cereals.  Foods and beverages sweetened with sugar alcohols, such as xylitol, sorbitol, and mannitol.  Some foods may be well  tolerated and may help thicken stool including:  Starchy foods, such as rice, toast, pasta, low-sugar cereal, oatmeal, grits, baked potatoes, crackers, and bagels.  Bananas.  Applesauce.  Add probiotic-rich foods to help increase healthy bacteria in the GI tract, such as yogurt and fermented milk products.  Wash your hands well after each diarrhea episode.  Only take over-the-counter or prescription medicines as directed by your caregiver.  Take a warm bath to relieve any burning or pain from frequent diarrhea episodes. SEEK IMMEDIATE MEDICAL CARE IF:   You are unable to keep fluids down.  You have persistent vomiting.  You have blood in your stool, or your stools are black and tarry.  You do not urinate in 6 8 hours, or there is only a small amount of very dark urine.  You have abdominal pain that increases or localizes.  You have weakness, dizziness, confusion, or lightheadedness.  You have a severe headache.  Your diarrhea gets worse or does not get better.  You have a fever or persistent symptoms for more than 2 3 days.  You have a fever and your symptoms suddenly get worse. MAKE SURE YOU:   Understand these instructions.  Will watch your condition.  Will get help right away if you are not doing well or get worse. Document Released: 05/24/2002 Document Revised: 05/20/2012 Document Reviewed: 02/09/2012 [employer] Patient Information 2014 [employer].  

## 2013-08-09 NOTE — Progress Notes (Signed)
Pre visit review using our clinic review tool, if applicable. No additional management support is needed unless otherwise documented below in the visit note. 

## 2013-08-09 NOTE — Progress Notes (Signed)
Subjective:    Patient ID: Devin White, male    DOB: 16-Sep-1955, 59 y.o.   MRN: [id number]  HPI  Pt presents to the clinic today with c/o nausea and diarrhea. This started 2 days ago. He denies vomiting. He is having multiple BM's per day > 5. He has not been able to eat much the last few days. He describes the stool as very loose and watery. He thinks it is a reaction to the colchicine. He started taking it 1 week ago. He denies blood in his stool. He denies fever, chills or body aches. He has not had sick contacts.  Review of Systems      Past Medical History  Diagnosis Date  . GERD (gastroesophageal reflux disease)   . Gout   . Benign prostatic hypertrophy   . Hypertension   . History of blood transfusion     pt has antibodies in his blood since previous transfusions  . History of cirrhosis of liver S/P TRANSPLANT 2009  . History of liver failure S/P TRANSPLANT  . Left ureteral calculus     Current Outpatient Prescriptions  Medication Sig Dispense Refill  . allopurinol (ZYLOPRIM) 300 MG tablet Take 1 tablet (300 mg total) by mouth daily.  30 tablet  11  . aspirin EC 81 MG tablet Take 81 mg by mouth daily.      . colchicine 0.6 MG tablet Take 0.6 mg by mouth 2 (two) times daily as needed. For gout flare      . HYDROcodone-acetaminophen (NORCO/VICODIN) 5-325 MG per tablet Take 1 tablet by mouth every 4 (four) hours as needed for pain.  20 tablet  0  . metoprolol succinate (TOPROL-XL) 50 MG 24 hr tablet TAKE ONE (1) TABLET BY MOUTH EVERY DAY. TAKE WITH OR IMMEDIATELY FOLLOWING A MEAL.  30 tablet  11  . omeprazole (PRILOSEC) 20 MG capsule TAKE ONE (1) CAPSULE BY MOUTH 2 TIMES DAILY  30 capsule  11  . tacrolimus (PROGRAF) 1 MG capsule Take 1 mg by mouth 2 (two) times daily.       . tamsulosin (FLOMAX) 0.4 MG CAPS capsule TAKE ONE CAPSULE BY MOUTH DAILY  30 capsule  0   No current facility-administered medications for this visit.    No Known Allergies  Family History    Problem Relation Age of Onset  . Cancer Mother     colon  . Arthritis Mother   . Vasculitis Mother   . Hypertension Mother   . Cancer Father     colon  . Kidney disease Father   . Arthritis Father   . Alcohol abuse Maternal Aunt   . Diabetes Maternal Aunt   . Alcohol abuse Maternal Uncle   . Diabetes Paternal Aunt   . Alcohol abuse Maternal Uncle   . Alcohol abuse Maternal Uncle   . Arthritis Brother     History   Social History  . Marital Status: Divorced    Spouse Name: N/A    Number of Children: 3  . Years of Education: N/A   Occupational History  . disabled    Social History Main Topics  . Smoking status: Never Smoker   . Smokeless tobacco: Never Used  . Alcohol Use: No  . Drug Use: No  . Sexual Activity: Not on file   Other Topics Concern  . Not on file   Social History Narrative  . No narrative on file     Constitutional: Denies fever, malaise, fatigue, headache  or abrupt weight changes.  Gastrointestinal: Pt reports diarrhea and nausea. Denies abdominal pain, bloating, constipation,  or blood in the stool.  Musculoskeletal: Pt reports pain and swelling of right big toe. Denies decrease in range of motion, difficulty with gait, muscle pain.  Skin: Pt reports redness of right big toe. Denies  rashes, lesions or ulcercations.   No other specific complaints in a complete review of systems (except as listed in HPI above).  Objective:   Physical Exam   BP 138/84  Pulse 78  Temp(Src) 97.7 F (36.5 C) (Oral)  Wt 272 lb (123.378 kg)  SpO2 97% Wt Readings from Last 3 Encounters:  08/09/13 272 lb (123.378 kg)  05/20/13 273 lb 4 oz (123.945 kg)  02/09/13 276 lb (125.193 kg)    General: Appears his stated age, well developed, well nourished in NAD. Cardiovascular: Normal rate and rhythm. S1,S2 noted.  No murmur, rubs or gallops noted. No JVD or BLE edema. No carotid bruits noted. Pulmonary/Chest: Normal effort and positive vesicular breath sounds. No  respiratory distress. No wheezes, rales or ronchi noted.  Abdomen: Soft and tender in the epigastric region. Normal bowel sounds, no bruits noted. No distention or masses noted. Liver, spleen and kidneys non palpable. Musculoskeletal: Normal range of motion. Swelling noted of the right MCP.  BMET    Component Value Date/Time   NA 141 02/09/2013 0910   K 4.0 02/09/2013 0910   CL 102 02/09/2013 0910   CO2 29 02/09/2013 0910   GLUCOSE 114* 02/09/2013 0910   BUN 12 02/09/2013 0910   CREATININE 1.6* 02/09/2013 0910   CALCIUM 10.0 02/09/2013 0910   GFRNONAA 62* 08/25/2012 1956   GFRAA 72* 08/25/2012 1956    Lipid Panel     Component Value Date/Time   CHOL 158 05/22/2012 0922   TRIG 284.0* 05/22/2012 0922   HDL 34.10* 05/22/2012 0922   CHOLHDL 5 05/22/2012 0922   VLDL 56.8* 05/22/2012 0922    CBC    Component Value Date/Time   WBC 8.8 02/09/2013 0910   RBC 5.59 02/09/2013 0910   HGB 18.4 Repeated and verified X2.* 02/09/2013 0910   HCT 53.4* 02/09/2013 0910   PLT 145.0* 02/09/2013 0910   MCV 95.5 02/09/2013 0910   MCH 32.7 08/25/2012 2148   MCHC 34.5 02/09/2013 0910   RDW 14.0 02/09/2013 0910   LYMPHSABS 1.4 02/09/2013 0910   MONOABS 0.8 02/09/2013 0910   EOSABS 0.5 02/09/2013 0910   BASOSABS 0.0 02/09/2013 0910    Hgb A1C No results found for this basename: HGBA1C         Assessment & Plan:   Diarrhea and nausea secondary to gout medication:  OK to take 1-2 Imodium in a 24 hour periods Drink plenty of fluids to avoid dehydration Will give 80 mg Depo IM for gout flare today Continue the allopurinol He declines RX for zofran  RTC as needed or if symptoms persist or worsen

## 2013-08-09 NOTE — Addendum Note (Signed)
Addended by: Lurlean Nanny on: 08/09/2013 04:52 PM   Modules accepted: Orders

## 2013-08-16 ENCOUNTER — Other Ambulatory Visit: Payer: Self-pay | Admitting: Internal Medicine

## 2013-08-17 ENCOUNTER — Ambulatory Visit (INDEPENDENT_AMBULATORY_CARE_PROVIDER_SITE_OTHER): Payer: Medicare Other | Admitting: Internal Medicine

## 2013-08-17 ENCOUNTER — Telehealth: Payer: Self-pay | Admitting: *Deleted

## 2013-08-17 ENCOUNTER — Encounter: Payer: Self-pay | Admitting: Internal Medicine

## 2013-08-17 VITALS — BP 120/100 | HR 104 | Temp 98.5°F | Ht 74.0 in | Wt 269.0 lb

## 2013-08-17 DIAGNOSIS — M109 Gout, unspecified: Secondary | ICD-10-CM

## 2013-08-17 DIAGNOSIS — K769 Liver disease, unspecified: Secondary | ICD-10-CM

## 2013-08-17 DIAGNOSIS — Z0000 Encounter for general adult medical examination without abnormal findings: Secondary | ICD-10-CM

## 2013-08-17 DIAGNOSIS — K7210 Chronic hepatic failure without coma: Secondary | ICD-10-CM

## 2013-08-17 DIAGNOSIS — N400 Enlarged prostate without lower urinary tract symptoms: Secondary | ICD-10-CM

## 2013-08-17 DIAGNOSIS — I10 Essential (primary) hypertension: Secondary | ICD-10-CM

## 2013-08-17 LAB — COMPREHENSIVE METABOLIC PANEL
ALT: 30 U/L (ref 0–53)
AST: 16 U/L (ref 0–37)
Albumin: 4 g/dL (ref 3.5–5.2)
Alkaline Phosphatase: 79 U/L (ref 39–117)
BILIRUBIN TOTAL: 2.1 mg/dL — AB (ref 0.3–1.2)
BUN: 15 mg/dL (ref 6–23)
CO2: 26 mEq/L (ref 19–32)
Calcium: 9.6 mg/dL (ref 8.4–10.5)
Chloride: 103 mEq/L (ref 96–112)
Creatinine, Ser: 1.5 mg/dL (ref 0.4–1.5)
GFR: 53.16 mL/min — AB (ref >60.00)
GLUCOSE: 95 mg/dL (ref 70–99)
Potassium: 4.2 mEq/L (ref 3.5–5.1)
Sodium: 140 mEq/L (ref 135–145)
Total Protein: 7.1 g/dL (ref 6.0–8.3)

## 2013-08-17 LAB — PSA: PSA: 0.78 ng/mL (ref 0.10–4.00)

## 2013-08-17 LAB — CBC WITH DIFFERENTIAL/PLATELET
BASOS PCT: 0.2 % (ref 0.0–3.0)
Basophils Absolute: 0 10*3/uL (ref 0.0–0.1)
EOS PCT: 0.6 % (ref 0.0–5.0)
Eosinophils Absolute: 0.1 10*3/uL (ref 0.0–0.7)
Hemoglobin: 18.6 g/dL (ref 13.0–17.0)
Lymphocytes Relative: 16.3 % (ref 12.0–46.0)
Lymphs Abs: 1.8 10*3/uL (ref 0.7–4.0)
MCHC: 33.2 g/dL (ref 30.0–36.0)
MCV: 98.7 fl (ref 78.0–100.0)
MONO ABS: 0.9 10*3/uL (ref 0.1–1.0)
MONOS PCT: 8.2 % (ref 3.0–12.0)
NEUTROS PCT: 74.7 % (ref 43.0–77.0)
Neutro Abs: 8.4 10*3/uL — ABNORMAL HIGH (ref 1.4–7.7)
Platelets: 174 10*3/uL (ref 150.0–400.0)
RBC: 5.67 Mil/uL (ref 4.22–5.81)
RDW: 14.1 % (ref 11.5–14.6)
WBC: 11.3 10*3/uL — AB (ref 4.5–10.5)

## 2013-08-17 LAB — T4, FREE: Free T4: 1.13 ng/dL (ref 0.60–1.60)

## 2013-08-17 LAB — TSH: TSH: 5.7 u[IU]/mL — ABNORMAL HIGH (ref 0.35–5.50)

## 2013-08-17 LAB — URIC ACID: URIC ACID, SERUM: 6.7 mg/dL (ref 4.0–7.8)

## 2013-08-17 MED ORDER — PREDNISONE 20 MG PO TABS: 40.0000 mg | ORAL_TABLET | Freq: Every day | ORAL | Status: DC

## 2013-08-17 MED ORDER — ALLOPURINOL 300 MG PO TABS: 300.0000 mg | ORAL_TABLET | Freq: Every day | ORAL | Status: DC

## 2013-08-17 NOTE — Assessment & Plan Note (Signed)
BP Readings from Last 3 Encounters:  08/17/13 120/100  08/09/13 138/84  05/20/13 825/74   High diastolic Repeat on right 935/521 No changes for now given low systolic

## 2013-08-17 NOTE — Progress Notes (Signed)
Subjective:    Patient ID: Devin White, male    DOB: 02-Jul-1955, 58 y.o.   MRN: [id number]  HPI Here for physical and Wellness visit Just sees doctors at [facility] for transplant Had urology eval at [facility]---still some pain in scrotum since stent done No alcohol or tobacco Hasn't been able to exercise Independent in all instrumental ADLs No falls No depression or anhedonia but frustrated by limitations from gout and leg pain No cognitive changes Reviewed his form Hearing fine since q-tips removed from ears--recent ENT eval No vision problems  Has been sick from the colchicine Only used for flares of the gout but got GI illness and diarrhea Does try aleve---doesn't help Continues on allopurinol  Upset---losing liver coordinator and doctor at [facility] transplant center No issues with liver though Continues on tacrolimus  Voiding okay on tamsulosin Only occasional nocturia  Current Outpatient Prescriptions on File Prior to Visit  Medication Sig Dispense Refill  . aspirin EC 81 MG tablet Take 81 mg by mouth daily.      Marland Kitchen HYDROcodone-acetaminophen (NORCO/VICODIN) 5-325 MG per tablet Take 1 tablet by mouth every 4 (four) hours as needed for pain.  20 tablet  0  . metoprolol succinate (TOPROL-XL) 50 MG 24 hr tablet TAKE ONE (1) TABLET BY MOUTH EVERY DAY. TAKE WITH OR IMMEDIATELY FOLLOWING A MEAL.  30 tablet  11  . omeprazole (PRILOSEC) 20 MG capsule TAKE ONE (1) CAPSULE BY MOUTH 2 TIMES DAILY  30 capsule  11  . tacrolimus (PROGRAF) 1 MG capsule Take 1 mg by mouth 2 (two) times daily.       Marland Kitchen allopurinol (ZYLOPRIM) 300 MG tablet TAKE ONE (1) TABLET BY MOUTH EVERY DAY  90 tablet  3  . tamsulosin (FLOMAX) 0.4 MG CAPS capsule TAKE ONE CAPSULE BY MOUTH DAILY  90 capsule  3   No current facility-administered medications on file prior to visit.    No Known Allergies  Past Medical History  Diagnosis Date  . GERD (gastroesophageal reflux disease)   . Gout   . Benign prostatic hypertrophy    . Hypertension   . History of blood transfusion     pt has antibodies in his blood since previous transfusions  . History of cirrhosis of liver S/P TRANSPLANT 2009  . History of liver failure S/P TRANSPLANT  . Left ureteral calculus     Past Surgical History  Procedure Laterality Date  . Removal of fistula    . Lumbar disc surgery    . Lumbar fusion    . Liver biopsy    . Liver transplant  11/24/2007    pt states doing well since liver transplant  . Bone morrow biopsy    . Cholecystectomy  2007  . Hernia repair  2008  . Cysto/ left retrograde pyelogram/ left ureteral stent placement  03-05-2012  DR MANNY [facility])    LEFT URETERAL CALCULI  . Ureteroscopy  03/11/2012    Procedure: URETEROSCOPY;  Surgeon: Alexis Frock, MD;  Location[facility];  Service: Urology;  Laterality: Left;   STONE MANIPULATION, stone obtained  [place] MCR  . Cystoscopy w/ ureteral stent placement  03/11/2012    Procedure: CYSTOSCOPY WITH STENT REPLACEMENT;  Surgeon: Alexis Frock, MD;  Location: [facility];  Service: Urology;  Laterality: Left;    Family History  Problem Relation Age of Onset  . Cancer Mother     colon  . Arthritis Mother   . Vasculitis Mother   .  Hypertension Mother   . Cancer Father     colon  . Kidney disease Father   . Arthritis Father   . Alcohol abuse Maternal Aunt   . Diabetes Maternal Aunt   . Alcohol abuse Maternal Uncle   . Diabetes Paternal Aunt   . Alcohol abuse Maternal Uncle   . Alcohol abuse Maternal Uncle   . Arthritis Brother     History   Social History  . Marital Status: Divorced    Spouse Name: N/A    Number of Children: 3  . Years of Education: N/A   Occupational History  . disabled    Social History Main Topics  . Smoking status: Never Smoker   . Smokeless tobacco: Never Used  . Alcohol Use: No  . Drug Use: No  . Sexual Activity: Not on file   Other Topics Concern  . Not on file   Social History  Narrative   Has living will   Requests daughter Devin White as his health care POA   Would accept brief attempt at resuscitation but no prolonged ventilation   No tube feeds if cognitively unaware   Review of Systems  Constitutional: Positive for diaphoresis, fatigue and unexpected weight change.       Wears seat belt Still gets hot flashes  HENT: Negative for dental problem, hearing loss and tinnitus.        Some oral blisters Regular with dentist but due now  Eyes: Negative for visual disturbance.       No diplopia or unilateral vision loss  Respiratory: Negative for cough, chest tightness and shortness of breath.   Cardiovascular: Negative for chest pain, palpitations and leg swelling.  Gastrointestinal: Positive for nausea, abdominal pain and diarrhea.       Problems with colchicine Stomach still not back to normal  Endocrine: Negative for cold intolerance and heat intolerance.       Sweats a lot  Genitourinary: Negative for urgency, frequency and difficulty urinating.       No sexual problems  Musculoskeletal: Positive for arthralgias and joint swelling. Negative for back pain.       Right 1st MTP  Skin: Negative for rash.  Allergic/Immunologic: Negative for environmental allergies and immunocompromised state.  Neurological: Positive for weakness, light-headedness and headaches. Negative for dizziness and syncope.  Hematological: Negative for adenopathy. Bruises/bleeds easily.  Psychiatric/Behavioral: Positive for sleep disturbance. Negative for dysphoric mood. The patient is not nervous/anxious.        Mood is okay but limited by gout pain---can't walk the way he wants to Some sleep problems       Objective:   Physical Exam  Constitutional: He is oriented to person, place, and time. He appears well-developed and well-nourished. No distress.  HENT:  Head: Normocephalic and atraumatic.  Right Ear: External ear normal.  Left Ear: External ear normal.  Mouth/Throat:  Oropharynx is clear and moist. No oropharyngeal exudate.  Eyes: Conjunctivae and EOM are normal. Pupils are equal, round, and reactive to light.  Neck: Normal range of motion. Neck supple. No thyromegaly present.  Cardiovascular: Normal rate, regular rhythm, normal heart sounds and intact distal pulses.  Exam reveals no gallop.   No murmur heard. Pulmonary/Chest: Effort normal and breath sounds normal. No respiratory distress. He has no wheezes. He has no rales.  Abdominal: Soft. There is no tenderness.  Musculoskeletal: He exhibits no edema.  Swelling with purplish discoloration and extreme tenderness at right 1st MTP  Lymphadenopathy:    He has  no cervical adenopathy.  Neurological: He is alert and oriented to person, place, and time.  President-- "Obama, Bush, Clinton" [phone] [id number]   "I can't do that" Recall 2/3  Skin: No rash noted. No erythema.  Psychiatric: He has a normal mood and affect. His behavior is normal.          Assessment & Plan:

## 2013-08-17 NOTE — Progress Notes (Signed)
Pre visit review using our clinic review tool, if applicable. No additional management support is needed unless otherwise documented below in the visit note. 

## 2013-08-17 NOTE — Telephone Encounter (Signed)
This is not really new for him

## 2013-08-17 NOTE — Assessment & Plan Note (Signed)
Voids fine on the tamsulosin 

## 2013-08-17 NOTE — Assessment & Plan Note (Signed)
Still active Will confirm uric acid under 6--increase the allopurinol prn Can't tolerate the colchicine---will give burst of prednisone

## 2013-08-17 NOTE — Assessment & Plan Note (Signed)
Seems okay Will defer tacrolimus level to [facility] since he took it this morning

## 2013-08-17 NOTE — Telephone Encounter (Signed)
Critical labs, HGB 18.6, results not in emr yet.

## 2013-08-17 NOTE — Assessment & Plan Note (Signed)
I have personally reviewed the Medicare Annual Wellness questionnaire and have noted 1. The patient's medical and social history 2. Their use of alcohol, tobacco or illicit drugs 3. Their current medications and supplements 4. The patient's functional ability including ADL's, fall risks, home safety risks and hearing or visual             impairment. 5. Diet and physical activities 6. Evidence for depression or mood disorders  The patients weight, height, BMI and visual acuity have been recorded in the chart I have made referrals, counseling and provided education to the patient based review of the above and I have provided the pt with a written personalized care plan for preventive services.  I have provided you with a copy of your personalized plan for preventive services. Please take the time to review along with your updated medication list.  Due for PSA Colonoscopy due next year

## 2013-08-18 ENCOUNTER — Telehealth: Payer: Self-pay | Admitting: Internal Medicine

## 2013-08-18 NOTE — Telephone Encounter (Signed)
Relevant patient education mailed to patient.  

## 2013-08-20 ENCOUNTER — Telehealth: Payer: Self-pay | Admitting: *Deleted

## 2013-08-20 MED ORDER — ALLOPURINOL 100 MG PO TABS: 100.0000 mg | ORAL_TABLET | Freq: Every day | ORAL | Status: DC

## 2013-08-20 NOTE — Telephone Encounter (Signed)
rx sent to pharmacy by e-script  

## 2013-08-20 NOTE — Telephone Encounter (Signed)
Message copied by Despina Hidden on Fri Aug 20, 2013  2:58 PM ------      Message from: Viviana Simpler I      Created: Wed Aug 18, 2013  7:52 AM       Results released on MyChart      Please send Rx for allopurinol 100mg  daily (to add to the 300mg  for total dose of 400mg  daily)--- 1 year Rx      Send copy to [facility] transplant team ------

## 2013-09-16 ENCOUNTER — Other Ambulatory Visit: Payer: Self-pay | Admitting: Internal Medicine

## 2013-10-28 ENCOUNTER — Emergency Department: Payer: Self-pay | Admitting: Emergency Medicine

## 2013-10-28 LAB — URINALYSIS, COMPLETE
BILIRUBIN, UR: NEGATIVE
Bacteria: NONE SEEN
Glucose,UR: NEGATIVE mg/dL (ref 0–75)
Ketone: NEGATIVE
Leukocyte Esterase: NEGATIVE
NITRITE: NEGATIVE
Ph: 7 (ref 4.5–8.0)
Protein: NEGATIVE
RBC,UR: 1 /HPF (ref 0–5)
Specific Gravity: 1.011 (ref 1.003–1.030)
Squamous Epithelial: 1

## 2013-10-28 LAB — CBC WITH DIFFERENTIAL/PLATELET
BASOS ABS: 0.1 10*3/uL (ref 0.0–0.1)
Basophil %: 0.6 %
EOS ABS: 0.2 10*3/uL (ref 0.0–0.7)
Eosinophil %: 1.9 %
HCT: 49.7 % (ref 40.0–52.0)
HGB: 16.7 g/dL (ref 13.0–18.0)
Lymphocyte #: 1.5 10*3/uL (ref 1.0–3.6)
Lymphocyte %: 16 %
MCH: 32.8 pg (ref 26.0–34.0)
MCHC: 33.7 g/dL (ref 32.0–36.0)
MCV: 97 fL (ref 80–100)
MONOS PCT: 12.6 %
Monocyte #: 1.2 x10 3/mm — ABNORMAL HIGH (ref 0.2–1.0)
Neutrophil #: 6.7 10*3/uL — ABNORMAL HIGH (ref 1.4–6.5)
Neutrophil %: 68.9 %
PLATELETS: 168 10*3/uL (ref 150–440)
RBC: 5.1 10*6/uL (ref 4.40–5.90)
RDW: 14.5 % (ref 11.5–14.5)
WBC: 9.7 10*3/uL (ref 3.8–10.6)

## 2013-10-28 LAB — COMPREHENSIVE METABOLIC PANEL
ALK PHOS: 72 U/L
AST: 23 U/L (ref 15–37)
Albumin: 3.5 g/dL (ref 3.4–5.0)
Anion Gap: 4 — ABNORMAL LOW (ref 7–16)
BILIRUBIN TOTAL: 1.4 mg/dL — AB (ref 0.2–1.0)
BUN: 10 mg/dL (ref 7–18)
CALCIUM: 8.9 mg/dL (ref 8.5–10.1)
CO2: 26 mmol/L (ref 21–32)
CREATININE: 1.31 mg/dL — AB (ref 0.60–1.30)
Chloride: 107 mmol/L (ref 98–107)
EGFR (African American): >60
GLUCOSE: 107 mg/dL — AB (ref 65–99)
OSMOLALITY: 273 (ref 275–301)
POTASSIUM: 4.1 mmol/L (ref 3.5–5.1)
SGPT (ALT): 53 U/L (ref 12–78)
Sodium: 137 mmol/L (ref 136–145)
Total Protein: 7.1 g/dL (ref 6.4–8.2)

## 2013-10-28 LAB — PROTIME-INR
INR: 1
Prothrombin Time: 13.3 secs (ref 11.5–14.7)

## 2013-10-28 LAB — TROPONIN I

## 2013-10-28 LAB — APTT: Activated PTT: 29.4 secs (ref 23.6–35.9)

## 2013-11-18 ENCOUNTER — Encounter: Payer: Self-pay | Admitting: Internal Medicine

## 2013-11-18 ENCOUNTER — Ambulatory Visit (INDEPENDENT_AMBULATORY_CARE_PROVIDER_SITE_OTHER): Payer: Medicare Other | Admitting: Internal Medicine

## 2013-11-18 VITALS — BP 140/80 | HR 79 | Temp 98.3°F | Wt 281.0 lb

## 2013-11-18 DIAGNOSIS — R6883 Chills (without fever): Secondary | ICD-10-CM | POA: Insufficient documentation

## 2013-11-18 MED ORDER — CEPHALEXIN 500 MG PO CAPS: 500.0000 mg | ORAL_CAPSULE | Freq: Four times a day (QID) | ORAL | Status: DC

## 2013-11-18 NOTE — Progress Notes (Signed)
Subjective:    Patient ID: Devin White, male    DOB: 1955-11-26, 58 y.o.   MRN: [id number]  HPI "I know my body and I know I have an infection" Right toe still not right--- not tender though Worried it is infection  Getting shaking, chills and flank pain (where transplant liver is) Teeth pain even Neck, arms, everything  Seen at [facility] 3 weeks ago--no diagnosis Was told to restart prednisone  Doesn't feel that he is any better Didn't help right great toe  Went to ER at [facility]---still with the systemic symptoms and worried about worsening back pain CT showed kidney stones but no obstruction Still with chills and then sweats Lots of testing--stayed overnight---- still no diagnosis  Nausea, then gets diarrhea with mucus (has occurred intermittently since the start--- worse at the start of this). Did seem better after going off colchicine No fevers  Current Outpatient Prescriptions on File Prior to Visit  Medication Sig Dispense Refill  . allopurinol (ZYLOPRIM) 100 MG tablet Take 1 tablet (100 mg total) by mouth daily.  90 tablet  3  . allopurinol (ZYLOPRIM) 300 MG tablet Take 1 tablet (300 mg total) by mouth daily.  90 tablet  3  . aspirin EC 81 MG tablet Take 81 mg by mouth daily.      . metoprolol succinate (TOPROL-XL) 50 MG 24 hr tablet TAKE ONE (1) TABLET BY MOUTH EVERY DAY. TAKE WITH OR IMMEDIATELY FOLLOWING A MEAL.  30 tablet  11  . omeprazole (PRILOSEC) 20 MG capsule TAKE ONE (1) CAPSULE BY MOUTH 2 TIMES DAILY  30 capsule  11  . predniSONE (DELTASONE) 20 MG tablet Take 2 tablets (40 mg total) by mouth daily.  15 tablet  2  . tacrolimus (PROGRAF) 1 MG capsule Take 1 mg by mouth 2 (two) times daily.       . tamsulosin (FLOMAX) 0.4 MG CAPS capsule TAKE ONE CAPSULE BY MOUTH DAILY  90 capsule  3   No current facility-administered medications on file prior to visit.    No Known Allergies  Past Medical History  Diagnosis Date  . GERD (gastroesophageal reflux disease)   .  Gout   . Benign prostatic hypertrophy   . Hypertension   . History of blood transfusion     pt has antibodies in his blood since previous transfusions  . History of cirrhosis of liver S/P TRANSPLANT 2009  . History of liver failure S/P TRANSPLANT  . Left ureteral calculus     Past Surgical History  Procedure Laterality Date  . Removal of fistula    . Lumbar disc surgery    . Lumbar fusion    . Liver biopsy    . Liver transplant  11/24/2007    pt states doing well since liver transplant  . Bone morrow biopsy    . Cholecystectomy  2007  . Hernia repair  2008  . Cysto/ left retrograde pyelogram/ left ureteral stent placement  03-05-2012  DR MANNY [facility])    LEFT URETERAL CALCULI  . Ureteroscopy  03/11/2012    Procedure: URETEROSCOPY;  Surgeon: Alexis Frock, MD;  Location: [facility];  Service: Urology;  Laterality: Left;   STONE MANIPULATION, stone obtained  [place] MCR  . Cystoscopy w/ ureteral stent placement  03/11/2012    Procedure: CYSTOSCOPY WITH STENT REPLACEMENT;  Surgeon: Alexis Frock, MD;  Location: [facility];  Service: Urology;  Laterality: Left;    Family History  Problem Relation Age  of Onset  . Cancer Mother     colon  . Arthritis Mother   . Vasculitis Mother   . Hypertension Mother   . Cancer Father     colon  . Kidney disease Father   . Arthritis Father   . Alcohol abuse Maternal Aunt   . Diabetes Maternal Aunt   . Alcohol abuse Maternal Uncle   . Diabetes Paternal Aunt   . Alcohol abuse Maternal Uncle   . Alcohol abuse Maternal Uncle   . Arthritis Brother     History   Social History  . Marital Status: Divorced    Spouse Name: N/A    Number of Children: 3  . Years of Education: N/A   Occupational History  . disabled    Social History Main Topics  . Smoking status: Never Smoker   . Smokeless tobacco: Never Used  . Alcohol Use: No  . Drug Use: No  . Sexual Activity: Not on file   Other Topics  Concern  . Not on file   Social History Narrative   Has living will   Requests daughter Devin White as his health care POA   Would accept brief attempt at resuscitation but no prolonged ventilation   No tube feeds if cognitively unaware   Review of Systems Appetite off the past 2 days---but otherwise normal for him Lost weight--then gained it back Some cough last night--nothing persistent Hard to breath due to abdominal pressure Some numbness in right hand Feels his mind is "off"--not as cognitively sharp    Objective:   Physical Exam  Constitutional: He appears well-developed and well-nourished. No distress.  Pulmonary/Chest: Effort normal. No respiratory distress. He has no wheezes. He has no rales.  Decreased breath sounds but clear  Abdominal: Soft. He exhibits no distension.  Slight tenderness  Musculoskeletal: He exhibits no edema and no tenderness.  Skin:  Redness at right 1st MTP-- warmth which extends beyond the joint. Not tender though          Assessment & Plan:

## 2013-11-18 NOTE — Assessment & Plan Note (Addendum)
Feels he has infection Multiple systemic symptoms No evidence of pneumonia, UTI, peritonitis (no ascites on CT scan) No heart murmur (doubt SBE) Does have what looks like mild infection in toe (warm but not tender so not gout) Reviewed labs from [facility] and [facility]  Will try cephalexin for toe and see what happens  Has appt next week at [facility]--they can recheck him

## 2013-11-30 ENCOUNTER — Emergency Department: Payer: Self-pay | Admitting: Emergency Medicine

## 2013-11-30 LAB — HEPATIC FUNCTION PANEL A (ARMC)
Albumin: 3.7 g/dL (ref 3.4–5.0)
Alkaline Phosphatase: 69 U/L
BILIRUBIN TOTAL: 1.5 mg/dL — AB (ref 0.2–1.0)
Bilirubin, Direct: 0.3 mg/dL — ABNORMAL HIGH (ref 0.00–0.20)
SGOT(AST): 29 U/L (ref 15–37)
SGPT (ALT): 44 U/L (ref 12–78)
Total Protein: 7.2 g/dL (ref 6.4–8.2)

## 2013-11-30 LAB — CBC
HCT: 47.3 % (ref 40.0–52.0)
HGB: 16.2 g/dL (ref 13.0–18.0)
MCH: 33.2 pg (ref 26.0–34.0)
MCHC: 34.2 g/dL (ref 32.0–36.0)
MCV: 97 fL (ref 80–100)
PLATELETS: 160 10*3/uL (ref 150–440)
RBC: 4.87 10*6/uL (ref 4.40–5.90)
RDW: 14.2 % (ref 11.5–14.5)
WBC: 10.6 10*3/uL (ref 3.8–10.6)

## 2013-11-30 LAB — BASIC METABOLIC PANEL
Anion Gap: 6 — ABNORMAL LOW (ref 7–16)
BUN: 7 mg/dL (ref 7–18)
CHLORIDE: 106 mmol/L (ref 98–107)
Calcium, Total: 8.9 mg/dL (ref 8.5–10.1)
Co2: 28 mmol/L (ref 21–32)
Creatinine: 1.4 mg/dL — ABNORMAL HIGH (ref 0.60–1.30)
GFR CALC NON AF AMER: 56 — AB
GLUCOSE: 105 mg/dL — AB (ref 65–99)
OSMOLALITY: 278 (ref 275–301)
POTASSIUM: 3.7 mmol/L (ref 3.5–5.1)
Sodium: 140 mmol/L (ref 136–145)

## 2013-11-30 LAB — URINALYSIS, COMPLETE
Bacteria: NONE SEEN
Bilirubin,UR: NEGATIVE
Glucose,UR: NEGATIVE mg/dL (ref 0–75)
Leukocyte Esterase: NEGATIVE
Nitrite: NEGATIVE
Ph: 6 (ref 4.5–8.0)
Protein: 30
RBC,UR: 11 /HPF (ref 0–5)
Specific Gravity: 1.017 (ref 1.003–1.030)
Squamous Epithelial: 2

## 2013-11-30 LAB — TROPONIN I: Troponin-I: <0.02 ng/mL

## 2013-12-05 LAB — CULTURE, BLOOD (SINGLE)

## 2014-02-01 ENCOUNTER — Other Ambulatory Visit: Payer: Self-pay | Admitting: Internal Medicine

## 2014-02-17 ENCOUNTER — Other Ambulatory Visit: Payer: Self-pay | Admitting: Internal Medicine

## 2014-06-20 ENCOUNTER — Other Ambulatory Visit: Payer: Self-pay | Admitting: Internal Medicine

## 2014-08-26 ENCOUNTER — Other Ambulatory Visit: Payer: Self-pay | Admitting: Internal Medicine

## 2014-08-27 MED ORDER — ALLOPURINOL 300 MG PO TABS: 300.0000 mg | ORAL_TABLET | Freq: Every day | ORAL | Status: DC

## 2014-09-15 ENCOUNTER — Other Ambulatory Visit: Payer: Self-pay | Admitting: Internal Medicine

## 2014-09-19 ENCOUNTER — Ambulatory Visit: Payer: Medicare Other | Admitting: Internal Medicine

## 2014-11-28 DIAGNOSIS — Z79899 Other long term (current) drug therapy: Secondary | ICD-10-CM | POA: Diagnosis not present

## 2014-11-28 DIAGNOSIS — Z944 Liver transplant status: Secondary | ICD-10-CM | POA: Diagnosis not present

## 2014-11-28 DIAGNOSIS — Z7982 Long term (current) use of aspirin: Secondary | ICD-10-CM | POA: Diagnosis not present

## 2014-11-28 DIAGNOSIS — Z23 Encounter for immunization: Secondary | ICD-10-CM | POA: Diagnosis not present

## 2014-11-28 DIAGNOSIS — Z792 Long term (current) use of antibiotics: Secondary | ICD-10-CM | POA: Diagnosis not present

## 2014-11-28 DIAGNOSIS — Z418 Encounter for other procedures for purposes other than remedying health state: Secondary | ICD-10-CM | POA: Diagnosis not present

## 2014-11-28 DIAGNOSIS — Z4823 Encounter for aftercare following liver transplant: Secondary | ICD-10-CM | POA: Diagnosis not present

## 2014-12-21 DIAGNOSIS — Z944 Liver transplant status: Secondary | ICD-10-CM | POA: Diagnosis not present

## 2014-12-21 DIAGNOSIS — Z418 Encounter for other procedures for purposes other than remedying health state: Secondary | ICD-10-CM | POA: Diagnosis not present

## 2014-12-30 DIAGNOSIS — Z418 Encounter for other procedures for purposes other than remedying health state: Secondary | ICD-10-CM | POA: Diagnosis not present

## 2014-12-30 DIAGNOSIS — Z7982 Long term (current) use of aspirin: Secondary | ICD-10-CM | POA: Diagnosis not present

## 2014-12-30 DIAGNOSIS — Z01812 Encounter for preprocedural laboratory examination: Secondary | ICD-10-CM | POA: Diagnosis not present

## 2014-12-30 DIAGNOSIS — K7589 Other specified inflammatory liver diseases: Secondary | ICD-10-CM | POA: Diagnosis not present

## 2014-12-30 DIAGNOSIS — Z5181 Encounter for therapeutic drug level monitoring: Secondary | ICD-10-CM | POA: Diagnosis not present

## 2014-12-30 DIAGNOSIS — Z944 Liver transplant status: Secondary | ICD-10-CM | POA: Diagnosis not present

## 2014-12-30 DIAGNOSIS — Z79899 Other long term (current) drug therapy: Secondary | ICD-10-CM | POA: Diagnosis not present

## 2014-12-30 DIAGNOSIS — K740 Hepatic fibrosis: Secondary | ICD-10-CM | POA: Diagnosis not present

## 2014-12-30 DIAGNOSIS — R945 Abnormal results of liver function studies: Secondary | ICD-10-CM | POA: Diagnosis not present

## 2014-12-30 DIAGNOSIS — Z4823 Encounter for aftercare following liver transplant: Secondary | ICD-10-CM | POA: Diagnosis not present

## 2014-12-30 DIAGNOSIS — T8649 Other complications of liver transplant: Secondary | ICD-10-CM | POA: Diagnosis not present

## 2015-01-06 DIAGNOSIS — Z9889 Other specified postprocedural states: Secondary | ICD-10-CM | POA: Diagnosis not present

## 2015-01-06 DIAGNOSIS — R0602 Shortness of breath: Secondary | ICD-10-CM | POA: Diagnosis not present

## 2015-01-06 DIAGNOSIS — Z944 Liver transplant status: Secondary | ICD-10-CM | POA: Diagnosis not present

## 2015-01-06 DIAGNOSIS — R1900 Intra-abdominal and pelvic swelling, mass and lump, unspecified site: Secondary | ICD-10-CM | POA: Diagnosis not present

## 2015-01-06 DIAGNOSIS — R001 Bradycardia, unspecified: Secondary | ICD-10-CM | POA: Diagnosis not present

## 2015-01-10 DIAGNOSIS — Z4823 Encounter for aftercare following liver transplant: Secondary | ICD-10-CM | POA: Diagnosis not present

## 2015-01-10 DIAGNOSIS — Z418 Encounter for other procedures for purposes other than remedying health state: Secondary | ICD-10-CM | POA: Diagnosis not present

## 2015-01-10 DIAGNOSIS — Z944 Liver transplant status: Secondary | ICD-10-CM | POA: Diagnosis not present

## 2015-01-18 DIAGNOSIS — Z418 Encounter for other procedures for purposes other than remedying health state: Secondary | ICD-10-CM | POA: Diagnosis not present

## 2015-01-18 DIAGNOSIS — Z944 Liver transplant status: Secondary | ICD-10-CM | POA: Diagnosis not present

## 2015-01-26 DIAGNOSIS — Z944 Liver transplant status: Secondary | ICD-10-CM | POA: Diagnosis not present

## 2015-01-26 DIAGNOSIS — Z418 Encounter for other procedures for purposes other than remedying health state: Secondary | ICD-10-CM | POA: Diagnosis not present

## 2015-02-07 DIAGNOSIS — Z418 Encounter for other procedures for purposes other than remedying health state: Secondary | ICD-10-CM | POA: Diagnosis not present

## 2015-02-07 DIAGNOSIS — Z944 Liver transplant status: Secondary | ICD-10-CM | POA: Diagnosis not present

## 2015-02-13 DIAGNOSIS — Z944 Liver transplant status: Secondary | ICD-10-CM | POA: Diagnosis not present

## 2015-02-13 DIAGNOSIS — Z79899 Other long term (current) drug therapy: Secondary | ICD-10-CM | POA: Diagnosis not present

## 2015-02-13 DIAGNOSIS — Z4823 Encounter for aftercare following liver transplant: Secondary | ICD-10-CM | POA: Diagnosis not present

## 2015-02-13 DIAGNOSIS — Z418 Encounter for other procedures for purposes other than remedying health state: Secondary | ICD-10-CM | POA: Diagnosis not present

## 2015-02-13 DIAGNOSIS — R358 Other polyuria: Secondary | ICD-10-CM | POA: Diagnosis not present

## 2015-02-13 DIAGNOSIS — D582 Other hemoglobinopathies: Secondary | ICD-10-CM | POA: Diagnosis not present

## 2015-02-13 DIAGNOSIS — R631 Polydipsia: Secondary | ICD-10-CM | POA: Diagnosis not present

## 2015-02-13 DIAGNOSIS — D751 Secondary polycythemia: Secondary | ICD-10-CM | POA: Diagnosis not present

## 2015-02-21 DIAGNOSIS — Z418 Encounter for other procedures for purposes other than remedying health state: Secondary | ICD-10-CM | POA: Diagnosis not present

## 2015-02-21 DIAGNOSIS — Z944 Liver transplant status: Secondary | ICD-10-CM | POA: Diagnosis not present

## 2015-02-22 ENCOUNTER — Encounter: Payer: Self-pay | Admitting: Internal Medicine

## 2015-02-22 ENCOUNTER — Ambulatory Visit (INDEPENDENT_AMBULATORY_CARE_PROVIDER_SITE_OTHER): Payer: Medicare Other | Admitting: Internal Medicine

## 2015-02-22 VITALS — BP 160/100 | HR 77 | Temp 97.9°F | Wt 286.0 lb

## 2015-02-22 DIAGNOSIS — I10 Essential (primary) hypertension: Secondary | ICD-10-CM

## 2015-02-22 DIAGNOSIS — K7210 Chronic hepatic failure without coma: Secondary | ICD-10-CM

## 2015-02-22 DIAGNOSIS — R946 Abnormal results of thyroid function studies: Secondary | ICD-10-CM | POA: Insufficient documentation

## 2015-02-22 LAB — T3, FREE: T3, Free: 2.6 pg/mL (ref 2.3–4.2)

## 2015-02-22 LAB — T4, FREE: Free T4: 1.01 ng/dL (ref 0.60–1.60)

## 2015-02-22 LAB — TSH: TSH: 3.99 u[IU]/mL (ref 0.35–4.50)

## 2015-02-22 MED ORDER — LOSARTAN POTASSIUM 50 MG PO TABS: 50.0000 mg | ORAL_TABLET | Freq: Every day | ORAL | Status: DC

## 2015-02-22 NOTE — Assessment & Plan Note (Signed)
Some degree of rejection LFTs not bad though now

## 2015-02-22 NOTE — Assessment & Plan Note (Signed)
Elevated free T4 and TSH Will repeat If still abnormal--will need pituitary MRI

## 2015-02-22 NOTE — Assessment & Plan Note (Addendum)
BP Readings from Last 3 Encounters:  02/22/15 160/100  11/18/13 140/80  08/17/13 120/100   Repeat 156/98 He states it was high even before going on prednisone Will add losartan and follow up soon Will need to watch renal function

## 2015-02-22 NOTE — Progress Notes (Signed)
Subjective:    Patient ID: Devin White, male    DOB: November 25, 1955, 59 y.o.   MRN: [id number]  HPI Here for check of blood pressure and abnormal thyroid checks  Did have infection in toe with last visit Recently found the liver to be in rejection--via biopsy Has been on prednisone for 2 months-- at first 40mg , now 25mg  Also on mycophenolate Thinks he got antibodies from transfusions before the transplant  He did note side effects with the prednisone Feels tired They are worried about his BP now-- could be due to the prednisone As high as 180/100  Some headaches in past few weeks--?from new meds No chest pain Breathing is okay No palpitations Exercise tolerance is better now recently  Current Outpatient Prescriptions on File Prior to Visit  Medication Sig Dispense Refill  . allopurinol (ZYLOPRIM) 100 MG tablet Take 1 tablet (100 mg total) by mouth daily. 90 tablet 3  . allopurinol (ZYLOPRIM) 300 MG tablet Take 1 tablet (300 mg total) by mouth daily. NEED TO SCHEDULE A PHYSICAL FOR MORE REFILLS 90 tablet 0  . allopurinol (ZYLOPRIM) 300 MG tablet Take 1 tablet (300 mg total) by mouth daily. NEED TO SCHEDULE A PHYSICAL FOR MORE REFILLS 90 tablet 0  . aspirin EC 81 MG tablet Take 81 mg by mouth daily.    . colchicine 0.6 MG tablet TAKE 1 TABLET BY MOUTH TWICE A DAY 60 tablet 11  . metoprolol succinate (TOPROL-XL) 50 MG 24 hr tablet TAKE 1 TABLET BY MOUTH DAILY WITH OR IMMEDIATELY FOLLOWING A MEAL 30 tablet 11  . omeprazole (PRILOSEC) 20 MG capsule TAKE ONE (1) CAPSULE BY MOUTH 2 TIMES DAILY 180 capsule 3  . tacrolimus (PROGRAF) 1 MG capsule Take 1 mg by mouth 2 (two) times daily.     . tamsulosin (FLOMAX) 0.4 MG CAPS capsule TAKE ONE CAPSULE BY MOUTH DAILY 90 capsule 3   No current facility-administered medications on file prior to visit.    No Known Allergies  Past Medical History  Diagnosis Date  . GERD (gastroesophageal reflux disease)   . Gout   . Benign prostatic  hypertrophy   . Hypertension   . History of blood transfusion     pt has antibodies in his blood since previous transfusions  . History of cirrhosis of liver S/P TRANSPLANT 2009  . History of liver failure S/P TRANSPLANT  . Left ureteral calculus     Past Surgical History  Procedure Laterality Date  . Removal of fistula    . Lumbar disc surgery    . Lumbar fusion    . Liver biopsy    . Liver transplant  11/24/2007    pt states doing well since liver transplant  . Bone morrow biopsy    . Cholecystectomy  2007  . Hernia repair  2008  . Cysto/ left retrograde pyelogram/ left ureteral stent placement  03-05-2012  DR MANNY [facility])    LEFT URETERAL CALCULI  . Ureteroscopy  03/11/2012    Procedure: URETEROSCOPY;  Surgeon: Alexis Frock, MD;  Location: [facility];  Service: Urology;  Laterality: Left;   STONE MANIPULATION, stone obtained  [place] MCR  . Cystoscopy w/ ureteral stent placement  03/11/2012    Procedure: CYSTOSCOPY WITH STENT REPLACEMENT;  Surgeon: Alexis Frock, MD;  Location: [facility];  Service: Urology;  Laterality: Left;    Family History  Problem Relation Age of Onset  . Cancer Mother     colon  .  Arthritis Mother   . Vasculitis Mother   . Hypertension Mother   . Cancer Father     colon  . Kidney disease Father   . Arthritis Father   . Alcohol abuse Maternal Aunt   . Diabetes Maternal Aunt   . Alcohol abuse Maternal Uncle   . Diabetes Paternal Aunt   . Alcohol abuse Maternal Uncle   . Alcohol abuse Maternal Uncle   . Arthritis Brother     Social History   Social History  . Marital Status: Divorced    Spouse Name: N/A  . Number of Children: 3  . Years of Education: N/A   Occupational History  . disabled    Social History Main Topics  . Smoking status: Never Smoker   . Smokeless tobacco: Never Used  . Alcohol Use: No  . Drug Use: No  . Sexual Activity: Not on file   Other Topics Concern  . Not on file     Social History Narrative   Has living will   Requests daughter [name] as his health care POA   Would accept brief attempt at resuscitation but no prolonged ventilation   No tube feeds if cognitively unaware   Review of Systems Some increase in energy lately Weight down 20# in 1 month--has been exercising more    Objective:   Physical Exam  Constitutional: He appears well-developed and well-nourished. No distress.  Neck: Normal range of motion. Neck supple. No thyromegaly present.  Cardiovascular: Normal rate, regular rhythm and normal heart sounds.  Exam reveals no gallop.   No murmur heard. Pulmonary/Chest: Effort normal and breath sounds normal. No respiratory distress. He has no wheezes. He has no rales.  Musculoskeletal: He exhibits no edema.  Lymphadenopathy:    He has no cervical adenopathy.  Skin: No rash noted.  Psychiatric: He has a normal mood and affect. His behavior is normal.          Assessment & Plan:

## 2015-02-22 NOTE — Progress Notes (Signed)
Pre visit review using our clinic review tool, if applicable. No additional management support is needed unless otherwise documented below in the visit note. 

## 2015-03-10 DIAGNOSIS — Z944 Liver transplant status: Secondary | ICD-10-CM | POA: Diagnosis not present

## 2015-03-10 DIAGNOSIS — Z418 Encounter for other procedures for purposes other than remedying health state: Secondary | ICD-10-CM | POA: Diagnosis not present

## 2015-03-23 ENCOUNTER — Other Ambulatory Visit: Payer: Self-pay | Admitting: Internal Medicine

## 2015-03-24 DIAGNOSIS — Z418 Encounter for other procedures for purposes other than remedying health state: Secondary | ICD-10-CM | POA: Diagnosis not present

## 2015-03-24 DIAGNOSIS — Z944 Liver transplant status: Secondary | ICD-10-CM | POA: Diagnosis not present

## 2015-03-28 ENCOUNTER — Ambulatory Visit: Payer: Medicare Other | Admitting: Internal Medicine

## 2015-03-30 ENCOUNTER — Ambulatory Visit (INDEPENDENT_AMBULATORY_CARE_PROVIDER_SITE_OTHER): Payer: Medicare Other | Admitting: Internal Medicine

## 2015-03-30 ENCOUNTER — Encounter: Payer: Self-pay | Admitting: Internal Medicine

## 2015-03-30 VITALS — BP 130/70 | HR 88 | Temp 98.3°F | Wt 291.0 lb

## 2015-03-30 DIAGNOSIS — I10 Essential (primary) hypertension: Secondary | ICD-10-CM | POA: Diagnosis not present

## 2015-03-30 DIAGNOSIS — Z23 Encounter for immunization: Secondary | ICD-10-CM | POA: Diagnosis not present

## 2015-03-30 NOTE — Assessment & Plan Note (Signed)
BP Readings from Last 3 Encounters:  03/30/15 130/70  02/22/15 160/100  11/18/13 140/80   Good response with losartan Recent renal profile at [facility] fine No changes now Dizziness not clearly from this (when leaning down--not getting up)

## 2015-03-30 NOTE — Addendum Note (Signed)
Addended by: Despina Hidden on: 03/30/2015 05:06 PM   Modules accepted: Orders

## 2015-03-30 NOTE — Progress Notes (Signed)
Pre visit review using our clinic review tool, if applicable. No additional management support is needed unless otherwise documented below in the visit note. 

## 2015-03-30 NOTE — Progress Notes (Signed)
Subjective:    Patient ID: Devin White, male    DOB: August 10, 1955, 59 y.o.   MRN: [id number]  HPI Here for follow up of HTN  No apparent problems with the losartan Has some dizziness if he is bending over--not when standing up Not sure if it may be related to other meds Mycophenolate increased to 3 times as much though And tacrolimus increased also Still on prednisone--though down to 15mg   No chest pain Mild SOB-- tries to stay active though  Current Outpatient Prescriptions on File Prior to Visit  Medication Sig Dispense Refill  . aspirin EC 81 MG tablet Take 81 mg by mouth daily.    Marland Kitchen losartan (COZAAR) 50 MG tablet Take 1 tablet (50 mg total) by mouth daily. 90 tablet 3  . metoprolol succinate (TOPROL-XL) 50 MG 24 hr tablet TAKE 1 TABLET BY MOUTH DAILY WITH OR IMMEDIATELY FOLLOWING A MEAL 30 tablet 11  . omeprazole (PRILOSEC) 20 MG capsule TAKE ONE (1) CAPSULE BY MOUTH 2 TIMES DAILY 180 capsule 3  . tacrolimus (PROGRAF) 1 MG capsule Take 1 mg by mouth 2 (two) times daily.     . tamsulosin (FLOMAX) 0.4 MG CAPS capsule TAKE ONE CAPSULE BY MOUTH DAILY 90 capsule 3   No current facility-administered medications on file prior to visit.    No Known Allergies  Past Medical History  Diagnosis Date  . GERD (gastroesophageal reflux disease)   . Gout   . Benign prostatic hypertrophy   . Hypertension   . History of blood transfusion     pt has antibodies in his blood since previous transfusions  . History of cirrhosis of liver S/P TRANSPLANT 2009  . History of liver failure S/P TRANSPLANT  . Left ureteral calculus     Past Surgical History  Procedure Laterality Date  . Removal of fistula    . Lumbar disc surgery    . Lumbar fusion    . Liver biopsy    . Liver transplant  11/24/2007    pt states doing well since liver transplant  . Bone morrow biopsy    . Cholecystectomy  2007  . Hernia repair  2008  . Cysto/ left retrograde pyelogram/ left ureteral stent placement   03-05-2012  DR MANNY [facility])    LEFT URETERAL CALCULI  . Ureteroscopy  03/11/2012    Procedure: URETEROSCOPY;  Surgeon: Alexis Frock, MD;  Location: [facility];  Service: Urology;  Laterality: Left;   STONE MANIPULATION, stone obtained  [place] MCR  . Cystoscopy w/ ureteral stent placement  03/11/2012    Procedure: CYSTOSCOPY WITH STENT REPLACEMENT;  Surgeon: Alexis Frock, MD;  Location: [facility];  Service: Urology;  Laterality: Left;    Family History  Problem Relation Age of Onset  . Cancer Mother     colon  . Arthritis Mother   . Vasculitis Mother   . Hypertension Mother   . Cancer Father     colon  . Kidney disease Father   . Arthritis Father   . Alcohol abuse Maternal Aunt   . Diabetes Maternal Aunt   . Alcohol abuse Maternal Uncle   . Diabetes Paternal Aunt   . Alcohol abuse Maternal Uncle   . Alcohol abuse Maternal Uncle   . Arthritis Brother     Social History   Social History  . Marital Status: Divorced    Spouse Name: N/A  . Number of Children: 3  . Years of Education: N/A  Occupational History  . disabled    Social History Main Topics  . Smoking status: Never Smoker   . Smokeless tobacco: Never Used  . Alcohol Use: No  . Drug Use: No  . Sexual Activity: Not on file   Other Topics Concern  . Not on file   Social History Narrative   Has living will   Requests daughter [name] as his health care POA   Would accept brief attempt at resuscitation but no prolonged ventilation   No tube feeds if cognitively unaware   Review of Systems Sweats easily Some headaches still Appetite is good Sleeping better now--on decreased prednisone Has gained 10# on the prednisone    Objective:   Physical Exam  Constitutional: He appears well-developed and well-nourished. No distress.  Neck: Normal range of motion. Neck supple. No thyromegaly present.  Cardiovascular: Normal rate, regular rhythm and normal heart sounds.   Exam reveals no gallop.   No murmur heard. Pulmonary/Chest: Effort normal and breath sounds normal. No respiratory distress. He has no wheezes. He has no rales.  Musculoskeletal: He exhibits no edema.  Lymphadenopathy:    He has no cervical adenopathy.          Assessment & Plan:

## 2015-04-10 DIAGNOSIS — Z418 Encounter for other procedures for purposes other than remedying health state: Secondary | ICD-10-CM | POA: Diagnosis not present

## 2015-04-10 DIAGNOSIS — K754 Autoimmune hepatitis: Secondary | ICD-10-CM | POA: Diagnosis not present

## 2015-04-10 DIAGNOSIS — D899 Disorder involving the immune mechanism, unspecified: Secondary | ICD-10-CM | POA: Diagnosis not present

## 2015-04-10 DIAGNOSIS — Z4823 Encounter for aftercare following liver transplant: Secondary | ICD-10-CM | POA: Diagnosis not present

## 2015-04-10 DIAGNOSIS — Z944 Liver transplant status: Secondary | ICD-10-CM | POA: Diagnosis not present

## 2015-04-10 DIAGNOSIS — T8643 Liver transplant infection: Secondary | ICD-10-CM

## 2015-05-19 DIAGNOSIS — Z944 Liver transplant status: Secondary | ICD-10-CM | POA: Diagnosis not present

## 2015-05-19 DIAGNOSIS — Z418 Encounter for other procedures for purposes other than remedying health state: Secondary | ICD-10-CM | POA: Diagnosis not present

## 2015-06-20 DIAGNOSIS — T8643 Liver transplant infection: Secondary | ICD-10-CM | POA: Diagnosis not present

## 2015-06-20 DIAGNOSIS — K754 Autoimmune hepatitis: Secondary | ICD-10-CM | POA: Diagnosis not present

## 2015-06-22 ENCOUNTER — Other Ambulatory Visit: Payer: Self-pay | Admitting: Internal Medicine

## 2015-07-17 DIAGNOSIS — I10 Essential (primary) hypertension: Secondary | ICD-10-CM | POA: Diagnosis not present

## 2015-07-17 DIAGNOSIS — K754 Autoimmune hepatitis: Secondary | ICD-10-CM | POA: Diagnosis not present

## 2015-07-17 DIAGNOSIS — Z4823 Encounter for aftercare following liver transplant: Secondary | ICD-10-CM | POA: Diagnosis not present

## 2015-07-17 DIAGNOSIS — Z944 Liver transplant status: Secondary | ICD-10-CM | POA: Diagnosis not present

## 2015-07-17 DIAGNOSIS — Z1211 Encounter for screening for malignant neoplasm of colon: Secondary | ICD-10-CM | POA: Diagnosis not present

## 2015-07-17 DIAGNOSIS — T8643 Liver transplant infection: Secondary | ICD-10-CM | POA: Diagnosis not present

## 2015-07-17 DIAGNOSIS — Z418 Encounter for other procedures for purposes other than remedying health state: Secondary | ICD-10-CM | POA: Diagnosis not present

## 2015-07-20 ENCOUNTER — Other Ambulatory Visit: Payer: Self-pay | Admitting: Internal Medicine

## 2015-07-25 DIAGNOSIS — Z944 Liver transplant status: Secondary | ICD-10-CM | POA: Diagnosis not present

## 2015-07-25 DIAGNOSIS — Z418 Encounter for other procedures for purposes other than remedying health state: Secondary | ICD-10-CM | POA: Diagnosis not present

## 2015-08-10 DIAGNOSIS — Z944 Liver transplant status: Secondary | ICD-10-CM | POA: Diagnosis not present

## 2015-08-10 DIAGNOSIS — Z418 Encounter for other procedures for purposes other than remedying health state: Secondary | ICD-10-CM | POA: Diagnosis not present

## 2015-09-20 DIAGNOSIS — Z944 Liver transplant status: Secondary | ICD-10-CM | POA: Diagnosis not present

## 2015-09-20 DIAGNOSIS — Z298 Encounter for other specified prophylactic measures: Secondary | ICD-10-CM | POA: Diagnosis not present

## 2015-10-05 DIAGNOSIS — D126 Benign neoplasm of colon, unspecified: Secondary | ICD-10-CM | POA: Diagnosis not present

## 2015-10-05 DIAGNOSIS — Z800 Family history of malignant neoplasm of digestive organs: Secondary | ICD-10-CM | POA: Diagnosis not present

## 2015-10-05 DIAGNOSIS — K219 Gastro-esophageal reflux disease without esophagitis: Secondary | ICD-10-CM | POA: Diagnosis not present

## 2015-10-05 DIAGNOSIS — Z7982 Long term (current) use of aspirin: Secondary | ICD-10-CM | POA: Diagnosis not present

## 2015-10-05 DIAGNOSIS — D120 Benign neoplasm of cecum: Secondary | ICD-10-CM | POA: Diagnosis not present

## 2015-10-05 DIAGNOSIS — K648 Other hemorrhoids: Secondary | ICD-10-CM | POA: Diagnosis not present

## 2015-10-05 DIAGNOSIS — Z79899 Other long term (current) drug therapy: Secondary | ICD-10-CM | POA: Diagnosis not present

## 2015-10-05 DIAGNOSIS — N400 Enlarged prostate without lower urinary tract symptoms: Secondary | ICD-10-CM | POA: Diagnosis not present

## 2015-10-05 DIAGNOSIS — D124 Benign neoplasm of descending colon: Secondary | ICD-10-CM | POA: Diagnosis not present

## 2015-10-05 DIAGNOSIS — R197 Diarrhea, unspecified: Secondary | ICD-10-CM | POA: Diagnosis not present

## 2015-10-05 DIAGNOSIS — K5730 Diverticulosis of large intestine without perforation or abscess without bleeding: Secondary | ICD-10-CM | POA: Diagnosis not present

## 2015-10-05 DIAGNOSIS — I10 Essential (primary) hypertension: Secondary | ICD-10-CM | POA: Diagnosis not present

## 2015-10-05 DIAGNOSIS — K7460 Unspecified cirrhosis of liver: Secondary | ICD-10-CM | POA: Diagnosis not present

## 2015-10-05 DIAGNOSIS — Z944 Liver transplant status: Secondary | ICD-10-CM | POA: Diagnosis not present

## 2015-10-05 DIAGNOSIS — Z1211 Encounter for screening for malignant neoplasm of colon: Secondary | ICD-10-CM | POA: Diagnosis not present

## 2015-10-05 LAB — HM COLONOSCOPY: HM Colonoscopy: ABNORMAL — AB

## 2015-10-06 ENCOUNTER — Encounter: Payer: Self-pay | Admitting: Internal Medicine

## 2015-10-06 ENCOUNTER — Ambulatory Visit (INDEPENDENT_AMBULATORY_CARE_PROVIDER_SITE_OTHER): Payer: Medicare Other | Admitting: Internal Medicine

## 2015-10-06 VITALS — BP 130/84 | HR 81 | Temp 97.5°F | Ht 73.0 in | Wt 294.0 lb

## 2015-10-06 DIAGNOSIS — G4730 Sleep apnea, unspecified: Secondary | ICD-10-CM

## 2015-10-06 DIAGNOSIS — Z0000 Encounter for general adult medical examination without abnormal findings: Secondary | ICD-10-CM

## 2015-10-06 DIAGNOSIS — R079 Chest pain, unspecified: Secondary | ICD-10-CM

## 2015-10-06 DIAGNOSIS — K7210 Chronic hepatic failure without coma: Secondary | ICD-10-CM | POA: Diagnosis not present

## 2015-10-06 NOTE — Assessment & Plan Note (Signed)
Ongoing care by [facility] transplant team

## 2015-10-06 NOTE — Progress Notes (Signed)
Subjective:    Patient ID: Devin White, male    DOB: 1955/10/08, 60 y.o.   MRN: [id number]  HPI Here for Medicare wellness and follow up of chronic health conditions Reviewed form and advanced directives Reviewed other doctors Vision and hearing are okay No falls Not really exercising No depression or anhedonia. Especially enjoys going to the beach in summer No apparent cognitive problems Independent with instrumental ADLs  Had colonoscopy yesterday Small polyps--awaiting the path They noted sleep apnea during the test  Continues under close observation at [facility] transplant Low dose prednisone--may have affected his appetite and he has gained weight Liver rejection is controlled on his increased regimen They told him he needed to see hematologist about his RBCs  Does have some daytime somnolence Has to stop mowing the yard frequently--- much different than last year Gets some chest pain Easy DOE Occasional racing heart with exertion Seems to gain stamina after a couple of rests while exerting himself  Current Outpatient Prescriptions on File Prior to Visit  Medication Sig Dispense Refill  . aspirin EC 81 MG tablet Take 81 mg by mouth daily.    Marland Kitchen losartan (COZAAR) 50 MG tablet Take 1 tablet (50 mg total) by mouth daily. 90 tablet 3  . metoprolol succinate (TOPROL-XL) 50 MG 24 hr tablet TAKE 1 TABLET BY MOUTH DAILY 30 tablet 11  . mycophenolate (CELLCEPT) 250 MG capsule Take 1,500 mg by mouth daily.     Marland Kitchen omeprazole (PRILOSEC) 20 MG capsule TAKE ONE (1) CAPSULE BY MOUTH 2 TIMES DAILY 180 capsule 3  . predniSONE (DELTASONE) 5 MG tablet Take 15 mg by mouth daily with breakfast.     . tacrolimus (PROGRAF) 1 MG capsule Take 1 mg by mouth 2 (two) times daily.     . tamsulosin (FLOMAX) 0.4 MG CAPS capsule TAKE ONE CAPSULE BY MOUTH DAILY 90 capsule 3   No current facility-administered medications on file prior to visit.    No Known Allergies  Past Medical History    Diagnosis Date  . GERD (gastroesophageal reflux disease)   . Gout   . Benign prostatic hypertrophy   . Hypertension   . History of blood transfusion     pt has antibodies in his blood since previous transfusions  . History of cirrhosis of liver S/P TRANSPLANT 2009  . History of liver failure S/P TRANSPLANT  . Left ureteral calculus     Past Surgical History  Procedure Laterality Date  . Removal of fistula    . Lumbar disc surgery    . Lumbar fusion    . Liver biopsy    . Liver transplant  11/24/2007    pt states doing well since liver transplant  . Bone morrow biopsy    . Cholecystectomy  2007  . Hernia repair  2008  . Cysto/ left retrograde pyelogram/ left ureteral stent placement  03-05-2012  DR MANNY [facility])    LEFT URETERAL CALCULI  . Ureteroscopy  03/11/2012    Procedure: URETEROSCOPY;  Surgeon: Alexis Frock, MD;  Location: [facility];  Service: Urology;  Laterality: Left;   STONE MANIPULATION, stone obtained  [place] MCR  . Cystoscopy w/ ureteral stent placement  03/11/2012    Procedure: CYSTOSCOPY WITH STENT REPLACEMENT;  Surgeon: Alexis Frock, MD;  Location: [facility];  Service: Urology;  Laterality: Left;    Family History  Problem Relation Age of Onset  . Cancer Mother     colon  . Arthritis  Mother   . Vasculitis Mother   . Hypertension Mother   . Cancer Father     colon  . Kidney disease Father   . Arthritis Father   . Alcohol abuse Maternal Aunt   . Diabetes Maternal Aunt   . Alcohol abuse Maternal Uncle   . Diabetes Paternal Aunt   . Alcohol abuse Maternal Uncle   . Alcohol abuse Maternal Uncle   . Arthritis Brother     Social History   Social History  . Marital Status: Divorced    Spouse Name: N/A  . Number of Children: 3  . Years of Education: N/A   Occupational History  . disabled    Social History Main Topics  . Smoking status: Never Smoker   . Smokeless tobacco: Never Used  . Alcohol Use: No   . Drug Use: No  . Sexual Activity: Not on file   Other Topics Concern  . Not on file   Social History Narrative   Has living will   Requests daughter [name] as his health care POA   Would accept brief attempt at resuscitation but no prolonged ventilation   No tube feeds if cognitively unaware   Review of Systems Not sleeping great---disjointed with frequent awakening. Thinks his granddaughter has noticed apnea at night--and he notes some choking/SOB which has awoken him Wears seat belt Weight up considerably in past year Teeth okay-- overdue for dentist Voids okay. Bowels are okay---goes frequently. Past colitis/fistula when 20. Occasional mild knee pain--mostly in cold weather No rash or suspicious lesions     Objective:   Physical Exam  Constitutional: He is oriented to person, place, and time. He appears well-developed. No distress.  HENT:  Mouth/Throat: Oropharynx is clear and moist. No oropharyngeal exudate.  Neck: Normal range of motion. Neck supple. No thyromegaly present.  Cardiovascular: Normal rate, regular rhythm, normal heart sounds and intact distal pulses.  Exam reveals no gallop.   No murmur heard. Pulmonary/Chest: Effort normal and breath sounds normal. No respiratory distress. He has no wheezes. He has no rales.  Abdominal: Soft. There is no tenderness.  Musculoskeletal: He exhibits no edema or tenderness.  Lymphadenopathy:    He has no cervical adenopathy.  Neurological: He is alert and oriented to person, place, and time.  President --- "Dwaine Deter, Bush" [phone] [id number] Recall 3/3  Skin: No rash noted. No erythema.  Psychiatric: He has a normal mood and affect. His behavior is normal.          Assessment & Plan:

## 2015-10-06 NOTE — Assessment & Plan Note (Addendum)
Worrisome history of exertional chest pain/dyspnea/diaphoresis and major change in exercise tolerance in past year EKG is unchanged---voltage LVH but no ischemic changes Will set up treadmill test and cardiology if anything worrisome on this

## 2015-10-06 NOTE — Assessment & Plan Note (Signed)
Documented by [facility] and granddaughter Needs further eval to decide type and severity

## 2015-10-06 NOTE — Progress Notes (Signed)
Pre visit review using our clinic review tool, if applicable. No additional management support is needed unless otherwise documented below in the visit note. 

## 2015-10-06 NOTE — Patient Instructions (Signed)
DASH Eating Plan  DASH stands for "Dietary Approaches to Stop Hypertension." The DASH eating plan is a healthy eating plan that has been shown to reduce high blood pressure (hypertension). Additional health benefits may include reducing the risk of type 2 diabetes mellitus, heart disease, and stroke. The DASH eating plan may also help with weight loss.  WHAT DO I NEED TO KNOW ABOUT THE DASH EATING PLAN?  For the DASH eating plan, you will follow these general guidelines:  · Choose foods with a percent daily value for sodium of less than 5% (as listed on the food label).  · Use salt-free seasonings or herbs instead of table salt or sea salt.  · Check with your health care provider or pharmacist before using salt substitutes.  · Eat lower-sodium products, often labeled as "lower sodium" or "no salt added."  · Eat fresh foods.  · Eat more vegetables, fruits, and low-fat dairy products.  · Choose whole grains. Look for the word "whole" as the first word in the ingredient list.  · Choose fish and skinless chicken or turkey more often than red meat. Limit fish, poultry, and meat to 6 oz (170 g) each day.  · Limit sweets, desserts, sugars, and sugary drinks.  · Choose heart-healthy fats.  · Limit cheese to 1 oz (28 g) per day.  · Eat more home-cooked food and less restaurant, buffet, and fast food.  · Limit fried foods.  · Cook foods using methods other than frying.  · Limit canned vegetables. If you do use them, rinse them well to decrease the sodium.  · When eating at a restaurant, ask that your food be prepared with less salt, or no salt if possible.  WHAT FOODS CAN I EAT?  Seek help from a dietitian for individual calorie needs.  Grains  Whole grain or whole wheat bread. Brown rice. Whole grain or whole wheat pasta. Quinoa, bulgur, and whole grain cereals. Low-sodium cereals. Corn or whole wheat flour tortillas. Whole grain cornbread. Whole grain crackers. Low-sodium crackers.  Vegetables  Fresh or frozen vegetables  (raw, steamed, roasted, or grilled). Low-sodium or reduced-sodium tomato and vegetable juices. Low-sodium or reduced-sodium tomato sauce and paste. Low-sodium or reduced-sodium canned vegetables.   Fruits  All fresh, canned (in natural juice), or frozen fruits.  Meat and Other Protein Products  Ground beef (85% or leaner), grass-fed beef, or beef trimmed of fat. Skinless chicken or turkey. Ground chicken or turkey. Pork trimmed of fat. All fish and seafood. Eggs. Dried beans, peas, or lentils. Unsalted nuts and seeds. Unsalted canned beans.  Dairy  Low-fat dairy products, such as skim or 1% milk, 2% or reduced-fat cheeses, low-fat ricotta or cottage cheese, or plain low-fat yogurt. Low-sodium or reduced-sodium cheeses.  Fats and Oils  Tub margarines without trans fats. Light or reduced-fat mayonnaise and salad dressings (reduced sodium). Avocado. Safflower, olive, or canola oils. Natural peanut or almond butter.  Other  Unsalted popcorn and pretzels.  The items listed above may not be a complete list of recommended foods or beverages. Contact your dietitian for more options.  WHAT FOODS ARE NOT RECOMMENDED?  Grains  White bread. White pasta. White rice. Refined cornbread. Bagels and croissants. Crackers that contain trans fat.  Vegetables  Creamed or fried vegetables. Vegetables in a cheese sauce. Regular canned vegetables. Regular canned tomato sauce and paste. Regular tomato and vegetable juices.  Fruits  Dried fruits. Canned fruit in light or heavy syrup. Fruit juice.  Meat and Other Protein   Products  Fatty cuts of meat. Ribs, chicken wings, bacon, sausage, bologna, salami, chitterlings, fatback, hot dogs, bratwurst, and packaged luncheon meats. Salted nuts and seeds. Canned beans with salt.  Dairy  Whole or 2% milk, cream, half-and-half, and cream cheese. Whole-fat or sweetened yogurt. Full-fat cheeses or blue cheese. Nondairy creamers and whipped toppings. Processed cheese, cheese spreads, or cheese  curds.  Condiments  Onion and garlic salt, seasoned salt, table salt, and sea salt. Canned and packaged gravies. Worcestershire sauce. Tartar sauce. Barbecue sauce. Teriyaki sauce. Soy sauce, including reduced sodium. Steak sauce. Fish sauce. Oyster sauce. Cocktail sauce. Horseradish. Ketchup and mustard. Meat flavorings and tenderizers. Bouillon cubes. Hot sauce. Tabasco sauce. Marinades. Taco seasonings. Relishes.  Fats and Oils  Butter, stick margarine, lard, shortening, ghee, and bacon fat. Coconut, palm kernel, or palm oils. Regular salad dressings.  Other  Pickles and olives. Salted popcorn and pretzels.  The items listed above may not be a complete list of foods and beverages to avoid. Contact your dietitian for more information.  WHERE CAN I FIND MORE INFORMATION?  National Heart, [employer], [employer] Institute: www.nhlbi.nih.gov/health/health-topics/topics/dash/     This information is not intended to replace advice given to you by your health care provider. Make sure you discuss any questions you have with your health care provider.     Document Released: 05/23/2011 Document Revised: 06/24/2014 Document Reviewed: 04/07/2013  Elsevier Interactive Patient Education ©2016 Elsevier Inc.

## 2015-10-06 NOTE — Assessment & Plan Note (Signed)
I have personally reviewed the Medicare Annual Wellness questionnaire and have noted 1. The patient's medical and social history 2. Their use of alcohol, tobacco or illicit drugs 3. Their current medications and supplements 4. The patient's functional ability including ADL's, fall risks, home safety risks and hearing or visual             impairment. 5. Diet and physical activities 6. Evidence for depression or mood disorders  The patients weight, height, BMI and visual acuity have been recorded in the chart I have made referrals, counseling and provided education to the patient based review of the above and I have provided the pt with a written personalized care plan for preventive services.  I have provided you with a copy of your personalized plan for preventive services. Please take the time to review along with your updated medication list.  Colon yesterday Discussed PSA--will defer this time UTD on imms Needs to work on fitness

## 2015-10-20 ENCOUNTER — Telehealth: Payer: Self-pay

## 2015-10-20 NOTE — Telephone Encounter (Signed)
Reviewed instructions for pt's GXT on Monday.  He understands to hold his metoprolol that am and will be here @ 9:30.

## 2015-11-14 ENCOUNTER — Institutional Professional Consult (permissible substitution): Payer: Medicare Other | Admitting: Internal Medicine

## 2015-11-19 ENCOUNTER — Other Ambulatory Visit: Payer: Self-pay | Admitting: Internal Medicine

## 2015-11-22 ENCOUNTER — Ambulatory Visit (INDEPENDENT_AMBULATORY_CARE_PROVIDER_SITE_OTHER): Payer: Medicare Other

## 2015-11-22 DIAGNOSIS — R079 Chest pain, unspecified: Secondary | ICD-10-CM

## 2015-11-22 LAB — EXERCISE TOLERANCE TEST
CHL CUP MPHR: 163 {beats}/min
CSEPEDS: 0 s
CSEPEW: 4.6 METS
Exercise duration (min): 3 min
Peak HR: 146 {beats}/min
Percent HR: 89 %
Rest HR: 103 {beats}/min

## 2015-11-23 ENCOUNTER — Other Ambulatory Visit: Payer: Self-pay | Admitting: Internal Medicine

## 2015-11-23 DIAGNOSIS — R079 Chest pain, unspecified: Secondary | ICD-10-CM

## 2015-11-24 ENCOUNTER — Encounter: Payer: Self-pay | Admitting: Internal Medicine

## 2015-11-29 ENCOUNTER — Ambulatory Visit (INDEPENDENT_AMBULATORY_CARE_PROVIDER_SITE_OTHER): Payer: Medicare Other | Admitting: Cardiology

## 2015-11-29 ENCOUNTER — Encounter: Payer: Self-pay | Admitting: Cardiology

## 2015-11-29 VITALS — BP 130/92 | HR 96 | Ht 73.0 in | Wt 295.2 lb

## 2015-11-29 DIAGNOSIS — I10 Essential (primary) hypertension: Secondary | ICD-10-CM | POA: Diagnosis not present

## 2015-11-29 DIAGNOSIS — R079 Chest pain, unspecified: Secondary | ICD-10-CM

## 2015-11-29 DIAGNOSIS — R0602 Shortness of breath: Secondary | ICD-10-CM

## 2015-11-29 MED ORDER — NITROGLYCERIN 0.4 MG SL SUBL: 0.4000 mg | SUBLINGUAL_TABLET | SUBLINGUAL | Status: DC | PRN

## 2015-11-29 NOTE — Patient Instructions (Addendum)
Medication Instructions:  Your physician has recommended you make the following change in your medication:  1. Nitroglycerin under the tongue for chest pain. Please read materials attached before taking first dose.    Labwork: None ordered  Testing/Procedures: Your physician has requested that you have an echocardiogram. Echocardiography is a painless test that uses sound waves to create images of your heart. It provides your doctor with information about the size and shape of your heart and how well your heart's chambers and valves are working. This procedure takes approximately one hour. There are no restrictions for this procedure.  Date & Time: _______________________________________________________  [facility]  Your caregiver has ordered a Stress Test with nuclear imaging. The purpose of this test is to evaluate the blood supply to your heart muscle. This procedure is referred to as a "Non-Invasive Stress Test." This is because other than having an IV started in your vein, nothing is inserted or "invades" your body. Cardiac stress tests are done to find areas of poor blood flow to the heart by determining the extent of coronary artery disease (CAD). Some patients exercise on a treadmill, which naturally increases the blood flow to your heart, while others who are  unable to walk on a treadmill due to physical limitations have a pharmacologic/chemical stress agent called Lexiscan . This medicine will mimic walking on a treadmill by temporarily increasing your coronary blood flow.   Please note: these test may take anywhere between 2-4 hours to complete  PLEASE REPORT TO [facility] AT THE FIRST DESK WILL DIRECT YOU WHERE TO GO  Date of Procedure:___Wednesday December 13, 2015 at 07:30 AM__  Arrival Time for Procedure:_Arrive at 07:15AM to register_______  Instructions regarding medication:   __X__ : Hold diabetes medication morning of procedure  __X__:   Hold metoprolol the night before procedure and morning of procedure   PLEASE NOTIFY THE OFFICE AT LEAST 24 HOURS IN ADVANCE IF YOU ARE UNABLE TO KEEP YOUR APPOINTMENT.  [phone] AND  PLEASE NOTIFY NUCLEAR MEDICINE AT [facility] AT LEAST 24 HOURS IN ADVANCE IF YOU ARE UNABLE TO KEEP YOUR APPOINTMENT. [phone]  How to prepare for your Myoview test:   Do not eat or drink after midnight  No caffeine for 24 hours prior to test  No smoking 24 hours prior to test.  Your medication may be taken with water.  If your doctor stopped a medication because of this test, do not take that medication.  Ladies, please do not wear dresses.  Skirts or pants are appropriate. Please wear a short sleeve shirt.  No perfume, cologne or lotion.  Wear comfortable walking shoes. No heels!    Follow-Up: Your physician recommends that you schedule a follow-up appointment after testing to review results.  Date & Time: _______________________________________________   Any Other Special Instructions Will Be Listed Below (If Applicable).     If you need a refill on your cardiac medications before your next appointment, please call your pharmacy.  Echocardiogram An echocardiogram, or echocardiography, uses sound waves (ultrasound) to produce an image of your heart. The echocardiogram is simple, painless, obtained within a short period of time, and offers valuable information to your health care provider. The images from an echocardiogram can provide information such as:  Evidence of coronary artery disease (CAD).  Heart size.  Heart muscle function.  Heart valve function.  Aneurysm detection.  Evidence of a past heart attack.  Fluid buildup around the heart.  Heart muscle thickening.  Assess heart valve function. LET [facility] CARE PROVIDER KNOW ABOUT:  Any allergies you have.  All medicines you are taking, including vitamins, herbs, eye drops, creams, and over-the-counter  medicines.  Previous problems you or members of your family have had with the use of anesthetics.  Any blood disorders you have.  Previous surgeries you have had.  Medical conditions you have.  Possibility of pregnancy, if this applies. BEFORE THE PROCEDURE  No special preparation is needed. Eat and drink normally.  PROCEDURE   In order to produce an image of your heart, gel will be applied to your chest and a wand-like tool (transducer) will be moved over your chest. The gel will help transmit the sound waves from the transducer. The sound waves will harmlessly bounce off your heart to allow the heart images to be captured in real-time motion. These images will then be recorded.  You may need an IV to receive a medicine that improves the quality of the pictures. AFTER THE PROCEDURE You may return to your normal schedule including diet, activities, and medicines, unless your health care provider tells you otherwise.   This information is not intended to replace advice given to you by your health care provider. Make sure you discuss any questions you have with your health care provider.   Document Released: 05/31/2000 Document Revised: 06/24/2014 Document Reviewed: 02/08/2013 Elsevier Interactive Patient Education 2016 [place].   Pharmacologic Stress Electrocardiogram A pharmacologic stress electrocardiogram is a heart (cardiac) test that uses nuclear imaging to evaluate the blood supply to your heart. This test may also be called a pharmacologic stress electrocardiography. Pharmacologic means that a medicine is used to increase your heart rate and blood pressure.  This stress test is done to find areas of poor blood flow to the heart by determining the extent of coronary artery disease (CAD). Some people exercise on a treadmill, which naturally increases the blood flow to the heart. For those people unable to exercise on a treadmill, a medicine is used. This medicine stimulates  your heart and will cause your heart to beat harder and more quickly, as if you were exercising.  Pharmacologic stress tests can help determine:  The adequacy of blood flow to your heart during increased levels of activity in order to clear you for discharge home.  The extent of coronary artery blockage caused by CAD.  Your prognosis if you have suffered a heart attack.  The effectiveness of cardiac procedures done, such as an angioplasty, which can increase the circulation in your coronary arteries.  Causes of chest pain or pressure. LET [facility] CARE PROVIDER KNOW ABOUT:  Any allergies you have.  All medicines you are taking, including vitamins, herbs, eye drops, creams, and over-the-counter medicines.  Previous problems you or members of your family have had with the use of anesthetics.  Any blood disorders you have.  Previous surgeries you have had.  Medical conditions you have.  Possibility of pregnancy, if this applies.  If you are currently breastfeeding. RISKS AND COMPLICATIONS Generally, this is a safe procedure. However, as with any procedure, complications can occur. Possible complications include:  You develop pain or pressure in the following areas:  Chest.  Jaw or neck.  Between your shoulder blades.  Radiating down your left arm.  Headache.  Dizziness or light-headedness.  Shortness of breath.  Increased or irregular heartbeat.  Low blood pressure.  Nausea or vomiting.  Flushing.  Redness going up the arm and slight pain during injection  of medicine.  Heart attack (rare). BEFORE THE PROCEDURE   Avoid all forms of caffeine for 24 hours before your test or as directed by your health care provider. This includes coffee, tea (even decaffeinated tea), caffeinated sodas, chocolate, cocoa, and certain pain medicines.  Follow your health care provider's instructions regarding eating and drinking before the test.  Take your medicines as  directed at regular times with water unless instructed otherwise. Exceptions may include:  If you have diabetes, ask how you are to take your insulin or pills. It is common to adjust insulin dosing the morning of the test.  If you are taking beta-blocker medicines, it is important to talk to your health care provider about these medicines well before the date of your test. Taking beta-blocker medicines may interfere with the test. In some cases, these medicines need to be changed or stopped 24 hours or more before the test.  If you wear a nitroglycerin patch, it may need to be removed prior to the test. Ask your health care provider if the patch should be removed before the test.  If you use an inhaler for any breathing condition, bring it with you to the test.  If you are an outpatient, bring a snack so you can eat right after the stress phase of the test.  Do not smoke for 4 hours prior to the test or as directed by your health care provider.  Do not apply lotions, powders, creams, or oils on your chest prior to the test.  Wear comfortable shoes and clothing. Let your health care provider know if you were unable to complete or follow the preparations for your test. PROCEDURE   Multiple patches (electrodes) will be put on your chest. If needed, small areas of your chest may be shaved to get better contact with the electrodes. Once the electrodes are attached to your body, multiple wires will be attached to the electrodes, and your heart rate will be monitored.  An IV access will be started. A nuclear trace (isotope) is given. The isotope may be given intravenously, or it may be swallowed. Nuclear refers to several types of radioactive isotopes, and the nuclear isotope lights up the arteries so that the nuclear images are clear. The isotope is absorbed by your body. This results in low radiation exposure.  A resting nuclear image is taken to show how your heart functions at rest.  A  medicine is given through the IV access.  A second scan is done about 1 hour after the medicine injection and determines how your heart functions under stress.  During this stress phase, you will be connected to an electrocardiogram machine. Your blood pressure and oxygen levels will be monitored. AFTER THE PROCEDURE   Your heart rate and blood pressure will be monitored after the test.  You may return to your normal schedule, including diet,activities, and medicines, unless your health care provider tells you otherwise.   This information is not intended to replace advice given to you by your health care provider. Make sure you discuss any questions you have with your health care provider.   Document Released: 10/20/2008 Document Revised: 06/08/2013 Document Reviewed: 02/08/2013 Elsevier Interactive Patient Education 2016 Elsevier Inc.   Nitroglycerin sublingual tablets What is this medicine? NITROGLYCERIN (nye troe GLI ser in) is a type of vasodilator. It relaxes blood vessels, increasing the blood and oxygen supply to your heart. This medicine is used to relieve chest pain caused by angina. It is also used  to prevent chest pain before activities like climbing stairs, going outdoors in cold weather, or sexual activity. This medicine may be used for other purposes; ask your health care provider or pharmacist if you have questions. What should I tell my health care provider before I take this medicine? They need to know if you have any of these conditions: -anemia -head injury, recent stroke, or bleeding in the brain -liver disease -previous heart attack -an unusual or allergic reaction to nitroglycerin, other medicines, foods, dyes, or preservatives -pregnant or trying to get pregnant -breast-feeding How should I use this medicine? Take this medicine by mouth as needed. At the first sign of an angina attack (chest pain or tightness) place one tablet under your tongue. You can also  take this medicine 5 to 10 minutes before an event likely to produce chest pain. Follow the directions on the prescription label. Let the tablet dissolve under the tongue. Do not swallow whole. Replace the dose if you accidentally swallow it. It will help if your mouth is not dry. Saliva around the tablet will help it to dissolve more quickly. Do not eat or drink, smoke or chew tobacco while a tablet is dissolving. If you are not better within 5 minutes after taking ONE dose of nitroglycerin, call 9-1-1 immediately to seek emergency medical care. Do not take more than 3 nitroglycerin tablets over 15 minutes. If you take this medicine often to relieve symptoms of angina, your doctor or health care professional may provide you with different instructions to manage your symptoms. If symptoms do not go away after following these instructions, it is important to call 9-1-1 immediately. Do not take more than 3 nitroglycerin tablets over 15 minutes. Talk to your pediatrician regarding the use of this medicine in children. Special care may be needed. Overdosage: If you think you have taken too much of this medicine contact a poison control center or emergency room at once. NOTE: This medicine is only for you. Do not share this medicine with others. What if I miss a dose? This does not apply. This medicine is only used as needed. What may interact with this medicine? Do not take this medicine with any of the following medications: -certain migraine medicines like ergotamine and dihydroergotamine (DHE) -medicines used to treat erectile dysfunction like sildenafil, tadalafil, and vardenafil -riociguat This medicine may also interact with the following medications: -alteplase -aspirin -heparin -medicines for high blood pressure -medicines for mental depression -other medicines used to treat angina -phenothiazines like chlorpromazine, mesoridazine, prochlorperazine, thioridazine This list may not describe all  possible interactions. Give your health care provider a list of all the medicines, herbs, non-prescription drugs, or dietary supplements you use. Also tell them if you smoke, drink alcohol, or use illegal drugs. Some items may interact with your medicine. What should I watch for while using this medicine? Tell your doctor or health care professional if you feel your medicine is no longer working. Keep this medicine with you at all times. Sit or lie down when you take your medicine to prevent falling if you feel dizzy or faint after using it. Try to remain calm. This will help you to feel better faster. If you feel dizzy, take several deep breaths and lie down with your feet propped up, or bend forward with your head resting between your knees. You may get drowsy or dizzy. Do not drive, use machinery, or do anything that needs mental alertness until you know how this drug affects you. Do not  stand or sit up quickly, especially if you are an older patient. This reduces the risk of dizzy or fainting spells. Alcohol can make you more drowsy and dizzy. Avoid alcoholic drinks. Do not treat yourself for coughs, colds, or pain while you are taking this medicine without asking your doctor or health care professional for advice. Some ingredients may increase your blood pressure. What side effects may I notice from receiving this medicine? Side effects that you should report to your doctor or health care professional as soon as possible: -blurred vision -dry mouth -skin rash -sweating -the feeling of extreme pressure in the head -unusually weak or tired Side effects that usually do not require medical attention (report to your doctor or health care professional if they continue or are bothersome): -flushing of the face or neck -headache -irregular heartbeat, palpitations -nausea, vomiting This list may not describe all possible side effects. Call your doctor for medical advice about side effects. You may  report side effects to FDA at 1-800-FDA-1088. Where should I keep my medicine? Keep out of the reach of children. Store at room temperature between 20 and 25 degrees C (68 and 77 degrees F). Store in [employer]. Protect from light and moisture. Keep tightly closed. Throw away any unused medicine after the expiration date. NOTE: This sheet is a summary. It may not cover all possible information. If you have questions about this medicine, talk to your doctor, pharmacist, or health care provider.    2016, Elsevier/[employer] Standard. (2013-04-01 17:57:36)

## 2015-11-29 NOTE — Progress Notes (Signed)
Cardiology Office Note   Date:  11/29/2015   ID:  Devin [facility], DOB 21-Oct-1955, MRN [id number]  Referring Doctor:  Viviana Simpler, MD   Cardiologist:   Wende Bushy, MD   Reason for consultation:  Chief Complaint  Patient presents with  . other    Ref by Dr. Silvio Pate for exertional chest pain and shortness of breath and discuss recent stress test.  Meds reviewed by the patient verbally.       History of Present Illness: Devin [place] is a 60 y.o. male who presents for  Chest pain and shortness of breath.   Patient is status post liver transplant for cirrhosis , etiology unknown. He's had changes to his medications for the liver transplant recently but in the last 2 months he is noted shortness of breath with exertion associated with chest pain. This shortness of breath is moderate to severe in intensity, limiting his physical ability, lasts throughout the duration of exertion and resolves at rest. Chest pain is more subtle mild tingling in the chest, nonradiating occurs with exertion and goes away with rest.   Patient denies fever, cough, colds, abdominal pain. He will be going for a sleep study as a workup for they noted to stop breathing during a routine colonoscopy. No PND no orthopnea and no edema   ROS:  Please see the history of present illness. Aside from mentioned under HPI, all other systems are reviewed and negative.     Past Medical History  Diagnosis Date  . GERD (gastroesophageal reflux disease)   . Gout   . Benign prostatic hypertrophy   . Hypertension   . History of blood transfusion     pt has antibodies in his blood since previous transfusions  . History of cirrhosis of liver S/P TRANSPLANT 2009  . History of liver failure S/P TRANSPLANT  . Left ureteral calculus     Past Surgical History  Procedure Laterality Date  . Removal of fistula    . Lumbar disc surgery    . Lumbar fusion    . Liver biopsy    . Liver transplant  11/24/2007    pt states  doing well since liver transplant  . Bone morrow biopsy    . Cholecystectomy  2007  . Hernia repair  2008  . Cysto/ left retrograde pyelogram/ left ureteral stent placement  03-05-2012  DR MANNY [facility])    LEFT URETERAL CALCULI  . Ureteroscopy  03/11/2012    Procedure: URETEROSCOPY;  Surgeon: Alexis Frock, MD;  Location: [facility];  Service: Urology;  Laterality: Left;   STONE MANIPULATION, stone obtained  [place] [facility]  . Cystoscopy w/ ureteral stent placement  03/11/2012    Procedure: CYSTOSCOPY WITH STENT REPLACEMENT;  Surgeon: Alexis Frock, MD;  [facility];  Service: Urology;  Laterality: Left;     reports that he has never smoked. He has never used smokeless tobacco. He reports that he does not drink alcohol or use illicit drugs.   family history includes Alcohol abuse in his maternal aunt, maternal uncle, maternal uncle, and maternal uncle; Arthritis in his brother, father, and mother; Cancer in his father and mother; Diabetes in his maternal aunt and paternal aunt; Hypertension in his mother; Kidney disease in his father; Vasculitis in his mother.   Current Outpatient Prescriptions  Medication Sig Dispense Refill  . aspirin EC 81 MG tablet Take 81 mg by mouth daily.    Marland Kitchen losartan (COZAAR) 50 MG  tablet Take 1 tablet (50 mg total) by mouth daily. 90 tablet 3  . metoprolol succinate (TOPROL-XL) 50 MG 24 hr tablet TAKE 1 TABLET BY MOUTH DAILY 30 tablet 11  . mycophenolate (CELLCEPT) 250 MG capsule Take 1,500 mg by mouth daily.     Marland Kitchen omeprazole (PRILOSEC) 20 MG capsule TAKE ONE (1) CAPSULE BY MOUTH 2 TIMES DAILY 180 capsule 3  . predniSONE (DELTASONE) 5 MG tablet Take 15 mg by mouth daily with breakfast.     . tacrolimus (PROGRAF) 1 MG capsule Take 1 mg by mouth 2 (two) times daily.     . tamsulosin (FLOMAX) 0.4 MG CAPS capsule TAKE ONE (1) CAPSULE BY MOUTH EACH DAY 90 capsule 3  . nitroGLYCERIN (NITROSTAT) 0.4 MG SL tablet Place 1 tablet  (0.4 mg total) under the tongue every 5 (five) minutes as needed for chest pain. 25 tablet 6   No current facility-administered medications for this visit.    Allergies: Review of patient's allergies indicates no known allergies.    PHYSICAL EXAM: VS:  BP 130/92 mmHg  Pulse 96  Ht 6\' 1"  (1.854 m)  Wt 295 lb 4 oz (133.925 kg)  BMI 38.96 kg/m2  SpO2 98% , Body mass index is 38.96 kg/(m^2). Wt Readings from Last 3 Encounters:  11/29/15 295 lb 4 oz (133.925 kg)  10/06/15 294 lb (133.358 kg)  03/30/15 291 lb (131.997 kg)    GENERAL:  well developed, well nourished, obese, not in acute distress HEENT: normocephalic, pink conjunctivae, anicteric sclerae, no xanthelasma, normal dentition, oropharynx clear NECK:  no neck vein engorgement, JVP normal, no hepatojugular reflux, carotid upstroke brisk and symmetric, no bruit, no thyromegaly, no lymphadenopathy LUNGS:  good respiratory effort, clear to auscultation bilaterally CV:  PMI not displaced, no thrills, no lifts, S1 and S2 within normal limits, no palpable S3 or S4, no murmurs, no rubs, no gallops ABD:  Soft, nontender, nondistended, normoactive bowel sounds, no abdominal aortic bruit, no hepatomegaly, no splenomegaly MS: nontender back, no kyphosis, no scoliosis, no joint deformities EXT:  2+ DP/PT pulses, no edema, no varicosities, no cyanosis, no clubbing SKIN: warm, nondiaphoretic, normal turgor, no ulcers NEUROPSYCH: alert, oriented to person, place, and time, sensory/motor grossly intact, normal mood, appropriate affect  Recent Labs: 02/22/2015: TSH 3.99   Lipid Panel    Component Value Date/Time   CHOL 158 05/22/2012 0922   TRIG 284.0* 05/22/2012 0922   HDL 34.10* 05/22/2012 0922   CHOLHDL 5 05/22/2012 0922   VLDL 56.8* 05/22/2012 0922   LDLDIRECT 87.7 05/22/2012 0922     Other studies Reviewed:  EKG:  The ekg from 10/06/2015 was personally reviewed by me and it revealed sinus rhythm 76 BPM, Foltx criteria for  LVH.  Additional studies/ records that were reviewed personally reviewed by me today include:  Exercise stress test 11/22/2015: Patient demonstrated poor functional capacity. Patient achieved 4.6 METs and reached 89% of maximum predicted heart rate. Baseline heart rate was already tachycardic at 103 bpm. No chest pain. There was note of maximal 2 mm horizontal ST depression as noted below. No significant arrhythmias. Positive plain treadmill test. If clinically indicated, recommend further evaluation with nuclear imaging.  ASSESSMENT AND PLAN:  chest pain  shortness of breath  positive plain treadmill test  Patient has risk factors for CAD including hypertension, age, gender, obesity.  Recommend to proceed with nuclear imaging, regular and some nuclear stress test. Patient had a very difficult time walking on the treadmill during the plain treadmill test  and therefore pharmacologic nuclear stress test is in order.  Recommend echocardiogram to evaluate LV function. Patient to continue ASA, and prescribed NTG SL prn for chest pain. Patient instructed to call 911 for unrelenting chest pain.  Hypertension Continue to monitor blood pressure goal is less than 140/80  Current medicines are reviewed at length with the patient today.  The patient does not have concerns regarding medicines.  Labs/ tests ordered today include:  Orders Placed This Encounter  Procedures  . NM Myocar Multi W/Spect W/Wall Motion / EF  . ECHOCARDIOGRAM COMPLETE    I had a lengthy and detailed discussion with the patient regarding diagnoses, prognosis, diagnostic options, treatment options , and side effects of medications.   I counseled the patient on importance of lifestyle modification including heart healthy diet, regular physical activity once cardiac workup is done.  I spent at least 60 minutes with the patient today and more than 50% of the time was spent counseling the patient and coordinating care.    Disposition:   FU with undersigned after tests    Signed, Wende Bushy, MD  11/29/2015 3:54 PM    [place]

## 2015-11-30 ENCOUNTER — Ambulatory Visit (INDEPENDENT_AMBULATORY_CARE_PROVIDER_SITE_OTHER): Payer: Medicare Other | Admitting: Internal Medicine

## 2015-11-30 ENCOUNTER — Encounter: Payer: Self-pay | Admitting: Internal Medicine

## 2015-11-30 VITALS — BP 142/90 | HR 85 | Ht 73.0 in | Wt 296.0 lb

## 2015-11-30 DIAGNOSIS — G4730 Sleep apnea, unspecified: Secondary | ICD-10-CM

## 2015-11-30 DIAGNOSIS — G4719 Other hypersomnia: Secondary | ICD-10-CM

## 2015-11-30 DIAGNOSIS — R0602 Shortness of breath: Secondary | ICD-10-CM | POA: Diagnosis not present

## 2015-11-30 NOTE — Progress Notes (Signed)
Devin [place]      Date: 11/30/2015,   MRN# [id number] ARZA KOETJE [birth year] Code Status:  Code Status History    This patient does not have a recorded code status. Please follow your organizational policy for patients in this situation.     Hosp day:@LENGTHOFSTAYDAYS @ Referring MD: @ATDPROV @     PCP:      AdmissionWeight: 296 lb (134.265 kg)                 CurrentWeight: 296 lb (134.265 kg) Devin [facility] is a 60 y.o. old male seen in [place] for SOB and sleep problems at the request of Dr. Garnetta Buddy.     CHIEF COMPLAINT:  SOB and daytime sleepiness    HISTORY OF PRESENT ILLNESS   60 yo pleasant white male seen today for problems with sleep, patient states that he has excessive daytime fatigue and sleepiness That has been going on for many years but has worsening in last several months  Patient with increased weight gain of 45 pounds in 1 year on chronic prednisone therapy Patient is s/p liver transplant 8 years ago-chronic immunosuppressive therapy  His above sleepiness is associated with SOB and DOE that has been worsening for several months Patient is non smoker, disabled, used to work for [employer]  No signs of infection at this time  He has associated waking up at night trying to catch his breath and he feels like choking in his sleep, he has non refreshing sleep He sleeps 6-8 hrs per night  Patient had colonoscopy 2 months ago and he stopped breathing with light sedation Also patient with abnormal treadmill stress test and waiting for Nuclear stress test  alsom   PAST MEDICAL HISTORY   Past Medical History  Diagnosis Date  . GERD (gastroesophageal reflux disease)   . Gout   . Benign prostatic hypertrophy   . Hypertension   . History of blood transfusion     pt has antibodies in his blood since previous transfusions  . History of cirrhosis of liver S/P TRANSPLANT 2009  . History of liver failure S/P  TRANSPLANT  . Left ureteral calculus      SURGICAL HISTORY   Past Surgical History  Procedure Laterality Date  . Removal of fistula    . Lumbar disc surgery    . Lumbar fusion    . Liver biopsy    . Liver transplant  11/24/2007    pt states doing well since liver transplant  . Bone morrow biopsy    . Cholecystectomy  2007  . Hernia repair  2008  . Cysto/ left retrograde pyelogram/ left ureteral stent placement  03-05-2012  DR MAN[facility])    LEFT URETERAL CALCULI  . Ureteroscopy  03/11/2012    Procedure: URETEROSCOPY;  Surgeon: Alexis Frock, MD;  Location: [facility];  Service: Urology;  Laterality: Left;   STONE MANIPULATION, stone obtained  [place] [facility]  . Cystoscopy w/ ureteral stent placement  03/11/2012    Procedure: CYSTOSCOPY WITH STENT REPLACEMENT;  Surgeon: Alexis Frock, MD;  Location: [facility];  Service: Urology;  Laterality: Left;     FAMILY HISTORY   Family History  Problem Relation Age of Onset  . Cancer Mother     colon  . Arthritis Mother   . Vasculitis Mother   . Hypertension Mother   . Cancer Father     colon  . Kidney disease Father   . Arthritis Father   .  Alcohol abuse Maternal Aunt   . Diabetes Maternal Aunt   . Alcohol abuse Maternal Uncle   . Diabetes Paternal Aunt   . Alcohol abuse Maternal Uncle   . Alcohol abuse Maternal Uncle   . Arthritis Brother      SOCIAL HISTORY   Social History  Substance Use Topics  . Smoking status: Never Smoker   . Smokeless tobacco: Never Used  . Alcohol Use: No     MEDICATIONS    Home Medication:  Current Outpatient Rx  Name  Route  Sig  Dispense  Refill  . aspirin EC 81 MG tablet   Oral   Take 81 mg by mouth daily.         Marland Kitchen losartan (COZAAR) 50 MG tablet   Oral   Take 1 tablet (50 mg total) by mouth daily.   90 tablet   3   . metoprolol succinate (TOPROL-XL) 50 MG 24 hr tablet      TAKE 1 TABLET BY MOUTH DAILY   30 tablet   11   .  mycophenolate (CELLCEPT) 250 MG capsule   Oral   Take 1,500 mg by mouth daily.          . nitroGLYCERIN (NITROSTAT) 0.4 MG SL tablet   Sublingual   Place 1 tablet (0.4 mg total) under the tongue every 5 (five) minutes as needed for chest pain.   25 tablet   6   . omeprazole (PRILOSEC) 20 MG capsule      TAKE ONE (1) CAPSULE BY MOUTH 2 TIMES DAILY   180 capsule   3   . predniSONE (DELTASONE) 5 MG tablet   Oral   Take 15 mg by mouth daily with breakfast.          . tacrolimus (PROGRAF) 1 MG capsule   Oral   Take 1 mg by mouth 2 (two) times daily.          . tamsulosin (FLOMAX) 0.4 MG CAPS capsule      TAKE ONE (1) CAPSULE BY MOUTH EACH DAY   90 capsule   3     Current Medication:  Current outpatient prescriptions:  .  aspirin EC 81 MG tablet, Take 81 mg by mouth daily., Disp: , Rfl:  .  losartan (COZAAR) 50 MG tablet, Take 1 tablet (50 mg total) by mouth daily., Disp: 90 tablet, Rfl: 3 .  metoprolol succinate (TOPROL-XL) 50 MG 24 hr tablet, TAKE 1 TABLET BY MOUTH DAILY, Disp: 30 tablet, Rfl: 11 .  mycophenolate (CELLCEPT) 250 MG capsule, Take 1,500 mg by mouth daily. , Disp: , Rfl:  .  nitroGLYCERIN (NITROSTAT) 0.4 MG SL tablet, Place 1 tablet (0.4 mg total) under the tongue every 5 (five) minutes as needed for chest pain., Disp: 25 tablet, Rfl: 6 .  omeprazole (PRILOSEC) 20 MG capsule, TAKE ONE (1) CAPSULE BY MOUTH 2 TIMES DAILY, Disp: 180 capsule, Rfl: 3 .  predniSONE (DELTASONE) 5 MG tablet, Take 15 mg by mouth daily with breakfast. , Disp: , Rfl:  .  tacrolimus (PROGRAF) 1 MG capsule, Take 1 mg by mouth 2 (two) times daily. , Disp: , Rfl:  .  tamsulosin (FLOMAX) 0.4 MG CAPS capsule, TAKE ONE (1) CAPSULE BY MOUTH EACH DAY, Disp: 90 capsule, Rfl: 3    ALLERGIES   Review of patient's allergies indicates no known allergies.     REVIEW OF SYSTEMS   Review of Systems  Constitutional: Positive for malaise/fatigue. Negative for fever, chills, weight  loss and  diaphoresis.  HENT: Negative for congestion and hearing loss.   Eyes: Negative for blurred vision and double vision.  Respiratory: Positive for shortness of breath. Negative for cough, hemoptysis, sputum production and wheezing.   Cardiovascular: Negative for chest pain, palpitations, orthopnea and leg swelling.  Gastrointestinal: Negative for heartburn, nausea, vomiting and abdominal pain.  Genitourinary: Negative for dysuria and urgency.  Musculoskeletal: Negative for myalgias, back pain and neck pain.  Skin: Negative for rash.  Neurological: Positive for weakness. Negative for dizziness and headaches.  Endo/Heme/Allergies: Does not bruise/bleed easily.  Psychiatric/Behavioral: Negative for depression, suicidal ideas and substance abuse. The patient is not nervous/anxious.   All other systems reviewed and are negative.    VS: BP 142/90 mmHg  Pulse 85  Ht 6\' 1"  (1.854 m)  Wt 296 lb (134.265 kg)  BMI 39.06 kg/m2  SpO2 94%     PHYSICAL EXAM  Physical Exam  Constitutional: He is oriented to person, place, and time. He appears well-developed and well-nourished. No distress.  HENT:  Head: Normocephalic and atraumatic.  Mouth/Throat: No oropharyngeal exudate.  Eyes: EOM are normal. Pupils are equal, round, and reactive to light. No scleral icterus.  Neck: Normal range of motion. Neck supple.  Cardiovascular: Normal rate, regular rhythm and normal heart sounds.   No murmur heard. Pulmonary/Chest: No stridor. No respiratory distress. He has no wheezes. He has no rales.  Abdominal: Soft. Bowel sounds are normal.  Musculoskeletal: Normal range of motion. He exhibits no edema.  Neurological: He is alert and oriented to person, place, and time. No cranial nerve deficit.  Skin: Skin is warm. He is not diaphoretic.  Psychiatric: He has a normal mood and affect.      CXR 11/2013 imiages reviewed 11/30/2015     ASSESSMENT/PLAN   60 yo pleasant white male obese with signs and  symptoms of sleep apnea with excessive daytime sleepiness with underlying probable cardiac ischemia with increased weight gain on chronic immunosuppressive therapy s/p liver transplant  1.patient will need Sleep Study ASAP-split night, check ONO 2.will need to check PFT's and 6MWT  3.need cardiology follow up for cardiac ischemia 4.need to follow up with [facility]   Will need to follow up after tests are completed, will consider CXR  And CT chest after cardiac/PFT/Sleep study completed   I have personally obtained a history, examined the patient, evaluated laboratory and independently reviewed imaging results, formulated the assessment and plan and placed orders.  The Patient requires high complexity decision making for assessment and support, frequent evaluation and titration of therapies, application of advanced monitoring technologies and extensive interpretation of multiple databases.   Patient  satisfied with Plan of action and management. All questions answered  Corrin Parker, M.D.  [facility]  Medical Director [place] Director [facility],The Cardio-Pulmonary Department

## 2015-11-30 NOTE — Patient Instructions (Signed)
Sleep Study Split Night PFT/6MWT Follow up Cardiology  Follow up [facility]   Sleep Apnea  Sleep apnea is a sleep disorder characterized by abnormal pauses in breathing while you sleep. When your breathing pauses, the level of oxygen in your blood decreases. This causes you to move out of deep sleep and into light sleep. As a result, your quality of sleep is poor, and the system that carries your blood throughout your body (cardiovascular system) experiences stress. If sleep apnea remains untreated, the following conditions can develop:  High blood pressure (hypertension).  Coronary artery disease.  Inability to achieve or maintain an erection (impotence).  Impairment of your thought process (cognitive dysfunction). There are three types of sleep apnea: 1. Obstructive sleep apnea--Pauses in breathing during sleep because of a blocked airway. 2. Central sleep apnea--Pauses in breathing during sleep because the area of the brain that controls your breathing does not send the correct signals to the muscles that control breathing. 3. Mixed sleep apnea--A combination of both obstructive and central sleep apnea. RISK FACTORS The following risk factors can increase your risk of developing sleep apnea:  Being overweight.  Smoking.  Having narrow passages in your nose and throat.  Being of older age.  Being male.  Alcohol use.  Sedative and tranquilizer use.  Ethnicity. Among individuals younger than 35 years, African Americans are at increased risk of sleep apnea. SYMPTOMS   Difficulty staying asleep.  Daytime sleepiness and fatigue.  Loss of energy.  Irritability.  Loud, heavy snoring.  Morning headaches.  Trouble concentrating.  Forgetfulness.  Decreased interest in sex.  Unexplained sleepiness. DIAGNOSIS  In order to diagnose sleep apnea, your caregiver will perform a physical examination. A sleep study done in the comfort of your own home may be  appropriate if you are otherwise healthy. Your caregiver may also recommend that you spend the night in a sleep lab. In the sleep lab, several monitors record information about your heart, lungs, and brain while you sleep. Your leg and arm movements and blood oxygen level are also recorded. TREATMENT The following actions may help to resolve mild sleep apnea:  Sleeping on your side.   Using a decongestant if you have nasal congestion.   Avoiding the use of depressants, including alcohol, sedatives, and narcotics.   Losing weight and modifying your diet if you are overweight. There also are devices and treatments to help open your airway:  Oral appliances. These are custom-made mouthpieces that shift your lower jaw forward and slightly open your bite. This opens your airway.  Devices that create positive airway pressure. This positive pressure "splints" your airway open to help you breathe better during sleep. The following devices create positive airway pressure:  Continuous positive airway pressure (CPAP) device. The CPAP device creates a continuous level of air pressure with an air pump. The air is delivered to your airway through a mask while you sleep. This continuous pressure keeps your airway open.  Nasal expiratory positive airway pressure (EPAP) device. The EPAP device creates positive air pressure as you exhale. The device consists of single-use valves, which are inserted into each nostril and held in place by adhesive. The valves create very little resistance when you inhale but create much more resistance when you exhale. That increased resistance creates the positive airway pressure. This positive pressure while you exhale keeps your airway open, making it easier to breath when you inhale again.  Bilevel positive airway pressure (BPAP) device. The BPAP device is used mainly  in patients with central sleep apnea. This device is similar to the CPAP device because it also uses an air  pump to deliver continuous air pressure through a mask. However, with the BPAP machine, the pressure is set at two different levels. The pressure when you exhale is lower than the pressure when you inhale.  Surgery. Typically, surgery is only done if you cannot comply with less invasive treatments or if the less invasive treatments do not improve your condition. Surgery involves removing excess tissue in your airway to create a wider passage way.   This information is not intended to replace advice given to you by your health care provider. Make sure you discuss any questions you have with your health care provider.   Document Released: 05/24/2002 Document Revised: 06/24/2014 Document Reviewed: 10/10/2011 Elsevier Interactive Patient Education [employer].

## 2015-12-12 ENCOUNTER — Telehealth: Payer: Self-pay | Admitting: Cardiology

## 2015-12-12 NOTE — Telephone Encounter (Signed)
Reviewed instructions with patient regarding stress test scheduled tomorrow. He did say that yesterday he was not feeling well but he hoped to be better by tomorrow. Let him know that if he needed to reschedule please call before end of business today to avoid any no show fees. Patient verbalized understanding of our conversation and had no further questions at this time.

## 2015-12-13 ENCOUNTER — Encounter
Admission: RE | Admit: 2015-12-13 | Discharge: 2015-12-13 | Disposition: A | Discharge status: Home / Self Care | Payer: Medicare Other | Source: Ambulatory Visit | Attending: Cardiology | Admitting: Cardiology

## 2015-12-13 DIAGNOSIS — I10 Essential (primary) hypertension: Secondary | ICD-10-CM | POA: Diagnosis not present

## 2015-12-13 DIAGNOSIS — R079 Chest pain, unspecified: Secondary | ICD-10-CM | POA: Insufficient documentation

## 2015-12-13 LAB — NM MYOCAR MULTI W/SPECT W/WALL MOTION / EF
CHL CUP NUCLEAR SRS: 0
CHL CUP NUCLEAR SSS: 2
LV sys vol: 38 mL
LVDIAVOL: 72 mL (ref 62–150)
NUC STRESS TID: 1.32
Peak HR: 102 {beats}/min
Percent HR: 62 %
Rest HR: 80 {beats}/min
SDS: 1

## 2015-12-13 MED ORDER — TECHNETIUM TC 99M TETROFOSMIN IV KIT
13.0000 | PACK | Freq: Once | INTRAVENOUS | Status: AC | PRN
  Administered 2015-12-13: 12.74 via INTRAVENOUS

## 2015-12-13 MED ORDER — TECHNETIUM TC 99M TETROFOSMIN IV KIT
32.6750 | PACK | Freq: Once | INTRAVENOUS | Status: AC | PRN
  Administered 2015-12-13: 32.675 via INTRAVENOUS

## 2015-12-13 MED ORDER — REGADENOSON 0.4 MG/5ML IV SOLN
0.4000 mg | Freq: Once | INTRAVENOUS | Status: AC
  Administered 2015-12-13: 0.4 mg via INTRAVENOUS

## 2015-12-15 ENCOUNTER — Ambulatory Visit (INDEPENDENT_AMBULATORY_CARE_PROVIDER_SITE_OTHER): Payer: Medicare Other

## 2015-12-15 ENCOUNTER — Other Ambulatory Visit: Payer: Self-pay

## 2015-12-15 DIAGNOSIS — I10 Essential (primary) hypertension: Secondary | ICD-10-CM

## 2015-12-15 DIAGNOSIS — R079 Chest pain, unspecified: Secondary | ICD-10-CM

## 2015-12-15 NOTE — Telephone Encounter (Signed)
Patient would like stress test results .  In office now for echo.

## 2015-12-15 NOTE — Telephone Encounter (Signed)
Reviewed preliminary myoview results w/pt who understands Dr. Yvone Neu will review and advise.

## 2015-12-16 LAB — ECHOCARDIOGRAM COMPLETE
AOASC: 32 cm
E decel time: 327 msec
EERAT: 4.71
FS: 43 % (ref 28–44)
IVS/LV PW RATIO, ED: 0.84
LA ID, A-P, ES: 39 mm
LA diam end sys: 39 mm
LA diam index: 1.54 cm/m2
LA vol A4C: 68.9 ml
LA vol index: 31.3 mL/m2
LAVOL: 79.6 mL
LDCA: 4.15 cm2
LV E/e' medial: 4.71
LV E/e'average: 4.71
LV PW d: 15.4 mm — AB (ref 0.6–1.1)
LV TDI E'LATERAL: 9.46
LV TDI E'MEDIAL: 5.33
LVELAT: 9.46 cm/s
LVOT VTI: 26.9 cm
LVOTD: 23 mm
LVOTPV: 110 cm/s
LVOTSV: 112 mL
MV Dec: 327
MV pk A vel: 64 m/s
MV pk E vel: 44.6 m/s
RV TAPSE: 20.4 mm

## 2015-12-28 ENCOUNTER — Encounter: Payer: Self-pay | Admitting: Cardiology

## 2015-12-28 ENCOUNTER — Ambulatory Visit (INDEPENDENT_AMBULATORY_CARE_PROVIDER_SITE_OTHER): Payer: Medicare Other | Admitting: Cardiology

## 2015-12-28 VITALS — BP 140/92 | HR 83 | Ht 73.0 in | Wt 296.0 lb

## 2015-12-28 DIAGNOSIS — I10 Essential (primary) hypertension: Secondary | ICD-10-CM

## 2015-12-28 DIAGNOSIS — R0602 Shortness of breath: Secondary | ICD-10-CM

## 2015-12-28 NOTE — Progress Notes (Signed)
Cardiology Office Note   Date:  12/28/2015   ID:  Devin White, DOB 1955/10/21, MRN [id number]  Referring Doctor:  Viviana Simpler, MD   Cardiologist:   Wende Bushy, MD   Reason for consultation:  Chief Complaint  Patient presents with  . other    Follow up from [place]. Meds reviewed by the patient verbally. "doing well."      History of Present Illness: Devin White is a 60 y.o. male who presents for  follow-up after testing.  Patient does not report any recurrence of chest pain. His shortness breath is also been stable. He is seeing a pulmonary doctor to have a lung function test. He is also going for sleep study soon.   Patient denies fever, cough, colds, abdominal pain. No PND no orthopnea and no edema   ROS:  Please see the history of present illness. Aside from mentioned under HPI, all other systems are reviewed and negative.     Past Medical History  Diagnosis Date  . GERD (gastroesophageal reflux disease)   . Gout   . Benign prostatic hypertrophy   . Hypertension   . History of blood transfusion     pt has antibodies in his blood since previous transfusions  . History of cirrhosis of liver S/P TRANSPLANT 2009  . History of liver failure S/P TRANSPLANT  . Left ureteral calculus     Past Surgical History  Procedure Laterality Date  . Removal of fistula    . Lumbar disc surgery    . Lumbar fusion    . Liver biopsy    . Liver transplant  11/24/2007    pt states doing well since liver transplant  . Bone morrow biopsy    . Cholecystectomy  2007  . Hernia repair  2008  . Cysto/ left retrograde pyelogram/ left ureteral stent placement  03-05-2012  DR [facility])    LEFT URETERAL CALCULI  . Ureteroscopy  03/11/2012    Procedure: URETEROSCOPY;  Surgeon: Alexis Frock, MD;  Location: [facility];  Service: Urology;  Laterality: Left;   STONE MANIPULATION, stone obtained  [facility]  . Cystoscopy w/ ureteral stent  placement  03/11/2012    Procedure: CYSTOSCOPY WITH STENT REPLACEMENT;  Surgeon: Alexis Frock, MD;  Location: [facility];  Service: Urology;  Laterality: Left;     reports that he has never smoked. He has never used smokeless tobacco. He reports that he does not drink alcohol or use illicit drugs.   family history includes Alcohol abuse in his maternal aunt, maternal uncle, maternal uncle, and maternal uncle; Arthritis in his brother, father, and mother; Cancer in his father and mother; Diabetes in his maternal aunt and paternal aunt; Hypertension in his mother; Kidney disease in his father; Vasculitis in his mother.   Current Outpatient Prescriptions  Medication Sig Dispense Refill  . aspirin EC 81 MG tablet Take 81 mg by mouth daily.    Marland Kitchen losartan (COZAAR) 50 MG tablet Take 1 tablet (50 mg total) by mouth daily. 90 tablet 3  . metoprolol succinate (TOPROL-XL) 50 MG 24 hr tablet TAKE 1 TABLET BY MOUTH DAILY 30 tablet 11  . mycophenolate (CELLCEPT) 250 MG capsule Take 1,500 mg by mouth daily.     . nitroGLYCERIN (NITROSTAT) 0.4 MG SL tablet Place 1 tablet (0.4 mg total) under the tongue every 5 (five) minutes as needed for chest pain. 25 tablet 6  . omeprazole (PRILOSEC) 20 MG  capsule TAKE ONE (1) CAPSULE BY MOUTH 2 TIMES DAILY 180 capsule 3  . tacrolimus (PROGRAF) 1 MG capsule Take 1 mg by mouth 2 (two) times daily.     . tamsulosin (FLOMAX) 0.4 MG CAPS capsule TAKE ONE (1) CAPSULE BY MOUTH EACH DAY 90 capsule 3   No current facility-administered medications for this visit.    Allergies: Review of patient's allergies indicates no known allergies.    PHYSICAL EXAM: VS:  BP 140/92 mmHg  Pulse 83  Ht 6\' 1"  (1.854 m)  Wt 296 lb (134.265 kg)  BMI 39.06 kg/m2 , Body mass index is 39.06 kg/(m^2). Wt Readings from Last 3 Encounters:  12/28/15 296 lb (134.265 kg)  11/30/15 296 lb (134.265 kg)  11/29/15 295 lb 4 oz (133.925 kg)    GENERAL:  well developed, well nourished,  obese, not in acute distress HEENT: normocephalic, pink conjunctivae, anicteric sclerae, no xanthelasma, normal dentition, oropharynx clear NECK:  no neck vein engorgement, JVP normal, no hepatojugular reflux, carotid upstroke brisk and symmetric, no bruit, no thyromegaly, no lymphadenopathy LUNGS:  good respiratory effort, clear to auscultation bilaterally CV:  PMI not displaced, no thrills, no lifts, S1 and S2 within normal limits, no palpable S3 or S4, no murmurs, no rubs, no gallops ABD:  Soft, nontender, nondistended, normoactive bowel sounds, no abdominal aortic bruit, no hepatomegaly, no splenomegaly MS: nontender back, no kyphosis, no scoliosis, no joint deformities EXT:  2+ DP/PT pulses, no edema, no varicosities, no cyanosis, no clubbing SKIN: warm, nondiaphoretic, normal turgor, no ulcers NEUROPSYCH: alert, oriented to person, place, and time, sensory/motor grossly intact, normal mood, appropriate affect  Recent Labs: 02/22/2015: TSH 3.99   Lipid Panel    Component Value Date/Time   CHOL 158 05/22/2012 0922   TRIG 284.0* 05/22/2012 0922   HDL 34.10* 05/22/2012 0922   CHOLHDL 5 05/22/2012 0922   VLDL 56.8* 05/22/2012 0922   LDLDIRECT 87.7 05/22/2012 0922     Other studies Reviewed:  EKG:  The ekg from 10/06/2015 was personally reviewed by me and it revealed sinus rhythm 76 BPM, Foltx criteria for LVH.  Additional studies/ records that were reviewed personally reviewed by me today include:  Exercise stress test 11/22/2015: Patient demonstrated poor functional capacity. Patient achieved 4.6 METs and reached 89% of maximum predicted heart rate. Baseline heart rate was already tachycardic at 103 bpm. No chest pain. There was note of maximal 2 mm horizontal ST depression as noted below. No significant arrhythmias. Positive plain treadmill test. If clinically indicated, recommend further evaluation with nuclear imaging.  Echocardiogram 12/15/2015: Left ventricle: The cavity size  was normal. Systolic function was  normal. The estimated ejection fraction was in the range of 60%  to 65%. Wall motion was normal; there were no regional wall  motion abnormalities. Doppler parameters are consistent with  abnormal left ventricular relaxation (grade 1 diastolic  dysfunction). - Aortic root: The aortic root was mildly dilated, 3.7 cm - Left atrium: The atrium was normal in size. - Right ventricle: Systolic function was normal. - Pulmonary arteries: Systolic pressure was within the normal  range.  Nuclear stress test 12/05/2015: Pharmacological myocardial perfusion imaging study with no significant ischemia Normal wall motion, EF estimated at 65% No EKG changes concerning for ischemia at peak stress or in recovery. Low risk scan  ASSESSMENT AND PLAN:  chest pain  shortness of breath  positive plain treadmill test  Patient has risk factors for CAD including hypertension, age, gender, obesity. Nuclear stress is was negative  for ischemia. LVEF was normal on echocardiogram. Results discussed with patient and daughter. Likely of clinically significant CAD is very low. If symptoms persist or recur, and pulmonary workup is negative, at that point we'll consider further invasive testing. Patient verbalized understanding and agreed with plan.  Hypertension Blood pressure is much better at home. Continue to monitor blood pressure goal is less than 140/80  Current medicines are reviewed at length with the patient today.  The patient does not have concerns regarding medicines.  Labs/ tests ordered today include:  No orders of the defined types were placed in this encounter.    I had a lengthy and detailed discussion with the patient regarding diagnoses, prognosis, diagnostic options, treatment options , and side effects of medications.   I counseled the patient on importance of lifestyle modification including heart healthy diet, regular physical  activity.  Disposition:   FU with prn   Signed, Wende Bushy, MD  12/28/2015 2:07 PM    [place]

## 2015-12-28 NOTE — Patient Instructions (Signed)
Medication Instructions:  Your physician recommends that you continue on your current medications as directed. Please refer to the Current Medication list given to you today.   Labwork: None ordered  Testing/Procedures: None ordered  Follow-Up: Your physician recommends that you schedule a follow-up appointment as needed.  It was a pleasure seeing you today here in the office. Please do not hesitate to give us a call back if you have any further questions. [phone]  Ayce Pietrzyk A. RN, BSN      Any Other Special Instructions Will Be Listed Below (If Applicable).     If you need a refill on your cardiac medications before your next appointment, please call your pharmacy.   

## 2016-01-11 ENCOUNTER — Ambulatory Visit: Payer: Medicare Other

## 2016-01-11 ENCOUNTER — Encounter: Payer: Self-pay | Admitting: *Deleted

## 2016-01-12 ENCOUNTER — Ambulatory Visit: Payer: Medicare Other | Admitting: Internal Medicine

## 2016-02-05 DIAGNOSIS — Z298 Encounter for other specified prophylactic measures: Secondary | ICD-10-CM | POA: Diagnosis not present

## 2016-02-05 DIAGNOSIS — Z944 Liver transplant status: Secondary | ICD-10-CM | POA: Diagnosis not present

## 2016-02-12 DIAGNOSIS — Z4823 Encounter for aftercare following liver transplant: Secondary | ICD-10-CM | POA: Diagnosis not present

## 2016-02-12 DIAGNOSIS — Z298 Encounter for other specified prophylactic measures: Secondary | ICD-10-CM | POA: Diagnosis not present

## 2016-02-12 DIAGNOSIS — Z79899 Other long term (current) drug therapy: Secondary | ICD-10-CM | POA: Diagnosis not present

## 2016-02-15 ENCOUNTER — Ambulatory Visit: Payer: Medicare Other

## 2016-02-20 ENCOUNTER — Other Ambulatory Visit: Payer: Self-pay | Admitting: Internal Medicine

## 2016-02-22 ENCOUNTER — Ambulatory Visit: Payer: Medicare Other | Admitting: Internal Medicine

## 2016-02-26 DIAGNOSIS — Z944 Liver transplant status: Secondary | ICD-10-CM | POA: Diagnosis not present

## 2016-02-26 DIAGNOSIS — Z298 Encounter for other specified prophylactic measures: Secondary | ICD-10-CM | POA: Diagnosis not present

## 2016-03-04 DIAGNOSIS — Z298 Encounter for other specified prophylactic measures: Secondary | ICD-10-CM | POA: Diagnosis not present

## 2016-03-04 DIAGNOSIS — R7989 Other specified abnormal findings of blood chemistry: Secondary | ICD-10-CM | POA: Diagnosis not present

## 2016-03-04 DIAGNOSIS — Z944 Liver transplant status: Secondary | ICD-10-CM | POA: Diagnosis not present

## 2016-03-04 DIAGNOSIS — Z48298 Encounter for aftercare following other organ transplant: Secondary | ICD-10-CM | POA: Diagnosis not present

## 2016-03-22 DIAGNOSIS — Z298 Encounter for other specified prophylactic measures: Secondary | ICD-10-CM | POA: Diagnosis not present

## 2016-03-22 DIAGNOSIS — D582 Other hemoglobinopathies: Secondary | ICD-10-CM | POA: Diagnosis not present

## 2016-03-22 DIAGNOSIS — Z4823 Encounter for aftercare following liver transplant: Secondary | ICD-10-CM | POA: Diagnosis not present

## 2016-03-22 DIAGNOSIS — Z944 Liver transplant status: Secondary | ICD-10-CM | POA: Diagnosis not present

## 2016-03-22 DIAGNOSIS — Z23 Encounter for immunization: Secondary | ICD-10-CM | POA: Diagnosis not present

## 2016-03-22 DIAGNOSIS — Z792 Long term (current) use of antibiotics: Secondary | ICD-10-CM | POA: Diagnosis not present

## 2016-04-01 DIAGNOSIS — Z944 Liver transplant status: Secondary | ICD-10-CM | POA: Diagnosis not present

## 2016-04-01 DIAGNOSIS — Z298 Encounter for other specified prophylactic measures: Secondary | ICD-10-CM | POA: Diagnosis not present

## 2016-04-04 ENCOUNTER — Encounter: Payer: Self-pay | Admitting: *Deleted

## 2016-04-04 ENCOUNTER — Emergency Department
Admission: EM | Admit: 2016-04-04 | Discharge: 2016-04-05 | Payer: Medicare Other | Attending: Internal Medicine | Admitting: Internal Medicine

## 2016-04-04 DIAGNOSIS — I10 Essential (primary) hypertension: Secondary | ICD-10-CM | POA: Insufficient documentation

## 2016-04-04 DIAGNOSIS — Z79899 Other long term (current) drug therapy: Secondary | ICD-10-CM | POA: Diagnosis not present

## 2016-04-04 DIAGNOSIS — R110 Nausea: Secondary | ICD-10-CM | POA: Diagnosis not present

## 2016-04-04 DIAGNOSIS — Z7982 Long term (current) use of aspirin: Secondary | ICD-10-CM | POA: Diagnosis not present

## 2016-04-04 DIAGNOSIS — K920 Hematemesis: Secondary | ICD-10-CM | POA: Diagnosis not present

## 2016-04-04 LAB — URINALYSIS COMPLETE WITH MICROSCOPIC (ARMC ONLY)
BILIRUBIN URINE: NEGATIVE
GLUCOSE, UA: NEGATIVE mg/dL
HGB URINE DIPSTICK: NEGATIVE
Ketones, ur: NEGATIVE mg/dL
LEUKOCYTES UA: NEGATIVE
NITRITE: NEGATIVE
Protein, ur: NEGATIVE mg/dL
SPECIFIC GRAVITY, URINE: 1.009 (ref 1.005–1.030)
Squamous Epithelial / LPF: NONE SEEN
WBC, UA: NONE SEEN WBC/hpf (ref 0–5)
pH: 5 (ref 5.0–8.0)

## 2016-04-04 LAB — APTT: aPTT: 30 seconds (ref 24–36)

## 2016-04-04 LAB — CBC
HEMATOCRIT: 49.1 % (ref 40.0–52.0)
Hemoglobin: 16.8 g/dL (ref 13.0–18.0)
MCH: 32.4 pg (ref 26.0–34.0)
MCHC: 34.2 g/dL (ref 32.0–36.0)
MCV: 94.8 fL (ref 80.0–100.0)
Platelets: 177 10*3/uL (ref 150–440)
RBC: 5.18 MIL/uL (ref 4.40–5.90)
RDW: 14.3 % (ref 11.5–14.5)
WBC: 9.6 10*3/uL (ref 3.8–10.6)

## 2016-04-04 LAB — COMPREHENSIVE METABOLIC PANEL
ALK PHOS: 52 U/L (ref 38–126)
ALT: 51 U/L (ref 17–63)
AST: 26 U/L (ref 15–41)
Albumin: 4.1 g/dL (ref 3.5–5.0)
Anion gap: 8 (ref 5–15)
BILIRUBIN TOTAL: 1.9 mg/dL — AB (ref 0.3–1.2)
BUN: 16 mg/dL (ref 6–20)
CALCIUM: 9 mg/dL (ref 8.9–10.3)
CO2: 26 mmol/L (ref 22–32)
CREATININE: 1.52 mg/dL — AB (ref 0.61–1.24)
Chloride: 107 mmol/L (ref 101–111)
GFR calc Af Amer: 57 mL/min — ABNORMAL LOW (ref >60)
GFR calc non Af Amer: 49 mL/min — ABNORMAL LOW (ref >60)
GLUCOSE: 133 mg/dL — AB (ref 65–99)
POTASSIUM: 4.1 mmol/L (ref 3.5–5.1)
Sodium: 141 mmol/L (ref 135–145)
TOTAL PROTEIN: 6.9 g/dL (ref 6.5–8.1)

## 2016-04-04 LAB — ABO/RH: ABO/RH(D): B POS

## 2016-04-04 LAB — PROTIME-INR
INR: 1
Prothrombin Time: 13.2 seconds (ref 11.4–15.2)

## 2016-04-04 MED ORDER — ONDANSETRON HCL 4 MG/2ML IJ SOLN
4.0000 mg | Freq: Once | INTRAMUSCULAR | Status: AC
  Administered 2016-04-04: 4 mg via INTRAVENOUS
  Filled 2016-04-04: qty 2

## 2016-04-04 MED ORDER — MORPHINE SULFATE (PF) 2 MG/ML IV SOLN
4.0000 mg | Freq: Once | INTRAVENOUS | Status: AC
  Administered 2016-04-04: 4 mg via INTRAVENOUS
  Filled 2016-04-04: qty 2

## 2016-04-04 MED ORDER — SODIUM CHLORIDE 0.9 % IV SOLN: 8.0000 mg/h | INTRAVENOUS | Status: DC

## 2016-04-04 MED ORDER — SODIUM CHLORIDE 0.9 % IV SOLN
40.0000 mg | Freq: Once | INTRAVENOUS | Status: AC
  Administered 2016-04-04: 40 mg via INTRAVENOUS
  Filled 2016-04-04 (×2): qty 40

## 2016-04-04 MED ORDER — PANTOPRAZOLE SODIUM 40 MG IV SOLR: 40.0000 mg | Freq: Two times a day (BID) | INTRAVENOUS | Status: DC

## 2016-04-04 NOTE — ED Notes (Signed)
RN notified by blood bank that blood would have to be transferred in from [place]. Blood bank will alert RN when they have ETA on blood arrival. Pt pending transfer to [facility]

## 2016-04-04 NOTE — ED Notes (Signed)
Pt reports hx of liver transplant 8 years ago. Pt reports transplant rejection medication was recently increased. Pt c/o general malaise and nausea X 1 week. Pt reports feeling "off" for 2  Months. Pt c/o hematemesis beginning at approx 0630 this am for a total of 3 times today. Pt reports 2 occurences of diarrhea today

## 2016-04-04 NOTE — ED Notes (Signed)
Spoke with MD about delay on blood procurement for patient. MD ordered no blood to be given at this time. Reported MD order to lab personnel.

## 2016-04-04 NOTE — ED Triage Notes (Signed)
Pt arrives with complaints of vomiting blood that began today, states bright red in nature, states feeling weak and nauseas, pt has hx of liver transplant 8 years ago

## 2016-04-04 NOTE — ED Provider Notes (Addendum)
[facility] Emergency Department Provider Note        Time seen: ----------------------------------------- 6:54 PM on 04/04/2016 -----------------------------------------    I have reviewed the triage vital signs and the nursing notes.   HISTORY  Chief Complaint Hematemesis    HPI Devin White is a 60 y.o. male who presents to ER with vomiting and hematemesis. Patient states he began vomiting blood today and has had several episodes of vomiting bright red blood. Patient states this happened prior to his liver transplant in the distant past about 8 years ago. He reports he feels weak and nauseous currently, reportedly he has began having some liver transplant rejection over the past year. He denies fevers, chills, chest pain currently.   Past Medical History:  Diagnosis Date  . Benign prostatic hypertrophy   . GERD (gastroesophageal reflux disease)   . Gout   . History of blood transfusion    pt has antibodies in his blood since previous transfusions  . History of cirrhosis of liver S/P TRANSPLANT 2009  . History of liver failure S/P TRANSPLANT  . Hypertension   . Left ureteral calculus     Patient Active Problem List   Diagnosis Date Noted  . SOB (shortness of breath) 11/30/2015  . Exertional chest pain 10/06/2015  . Sleep apnea 10/06/2015  . Abnormal thyroid function test 02/22/2015  . Ureteral stone with hydronephrosis 03/02/2012  . Hypertension   . Routine general medical examination at a health care facility 04/26/2011  . Chronic liver failure ([place]) 09/24/2010  . GOUT 12/21/2008  . BENIGN PROSTATIC HYPERTROPHY 12/21/2008  . ALLERGIC RHINITIS 05/22/2007  . GERD 05/22/2007  . HIATAL HERNIA 05/22/2007  . IRRITABLE BOWEL SYNDROME, HX OF 05/22/2007  . RENAL CALCULUS, HX OF 05/22/2007    Past Surgical History:  Procedure Laterality Date  . bone morrow biopsy    . CHOLECYSTECTOMY  2007  . CYSTO/ LEFT RETROGRADE PYELOGRAM/ LEFT  URETERAL STENT PLACEMENT  03-05-2012  DR Tresa [facility] [place])   LEFT URETERAL CALCULI  . CYSTOSCOPY W/ URETERAL STENT PLACEMENT  03/11/2012   Procedure: CYSTOSCOPY WITH STENT REPLACEMENT;  Surgeon: Alexis Frock, MD;  Location: [facility];  Service: Urology;  Laterality: Left;  . HERNIA REPAIR  2008  . LIVER BIOPSY    . LIVER TRANSPLANT  11/24/2007   pt states doing well since liver transplant  . LUMBAR DISC SURGERY    . LUMBAR FUSION    . removal of fistula    . URETEROSCOPY  03/11/2012   Procedure: URETEROSCOPY;  Surgeon: Alexis Frock, MD;  Location: [facility];  Service: Urology;  Laterality: Left;   STONE MANIPULATION, stone obtained  [facility]    Allergies Review of patient's allergies indicates no known allergies.  Social History Social History  Substance Use Topics  . Smoking status: Never Smoker  . Smokeless tobacco: Never Used  . Alcohol use No    Review of Systems Constitutional: Negative for fever. Cardiovascular: Negative for chest pain. Respiratory: Negative for shortness of breath. Gastrointestinal: Positive for nausea and hematemesis Genitourinary: Negative for dysuria. Musculoskeletal: Negative for back pain. Skin: Negative for rash. Neurological: Negative for headaches, focal weakness or numbness.  10-point ROS otherwise negative.  ____________________________________________   PHYSICAL EXAM:  VITAL SIGNS: ED Triage Vitals [04/04/16 1801]  Enc Vitals Group     BP (!) 150/87     Pulse Rate 93     Resp 18     Temp 97.4 F (36.3  C)     Temp Source Oral     SpO2 95 %     Weight 290 lb (131.5 kg)     Height 6\' 1"  (1.854 m)     Head Circumference      Peak Flow      Pain Score      Pain Loc      Pain Edu?      Excl. in [place]?     Constitutional: Alert and oriented. Well appearing and in no distress. Eyes: Conjunctivae are normal. PERRL. Normal extraocular movements. ENT   Head: Normocephalic and  atraumatic.   Nose: No congestion/rhinnorhea.   Mouth/Throat: Mucous membranes are moist.   Neck: No stridor. Cardiovascular: Normal rate, regular rhythm. No murmurs, rubs, or gallops. Respiratory: Normal respiratory effort without tachypnea nor retractions. Breath sounds are clear and equal bilaterally. No wheezes/rales/rhonchi. Gastrointestinal: Right upper quadrant tenderness, mild hepatomegaly Musculoskeletal: Nontender with normal range of motion in all extremities. No lower extremity tenderness nor edema. Neurologic:  Normal speech and language. No gross focal neurologic deficits are appreciated.  Skin:  Skin is warm, dry and intact. No rash noted. Psychiatric: Mood and affect are normal. Speech and behavior are normal.  ____________________________________________  EKG: Interpreted by me. Sinus rhythm rate 86 bpm, normal PR interval, normal QRS, normal QT, normal axis.  ____________________________________________  ED COURSE:  Pertinent labs & imaging results that were available during my care of the patient were reviewed by me and considered in my medical decision making (see chart for details). Clinical Course  Patient presents to ER with nausea and vomiting with hematemesis and weakness. We will assess with labs and coags and we will give IV Protonix.  Procedures ____________________________________________   LABS (pertinent positives/negatives)  Labs Reviewed  COMPREHENSIVE METABOLIC PANEL - Abnormal; Notable for the following:       Result Value   Glucose, Bld 133 (*)    Creatinine, Ser 1.52 (*)    Total Bilirubin 1.9 (*)    GFR calc non Af Amer 49 (*)    GFR calc Af Amer 57 (*)    All other components within normal limits  CBC  PROTIME-INR  APTT  URINALYSIS COMPLETEWITH MICROSCOPIC ([facility] ONLY)  POC OCCULT BLOOD, ED  TYPE AND SCREEN  ___________________________________________  FINAL ASSESSMENT AND PLAN  Hematemesis  Plan: Patient with labs as  dictated above. Patient is a liver transplant patient recently treated for signs of graft rejection. He has had nausea and frank hematemesis today. This would warn observation, he has requested transfer to [facility]. I will discuss with the transfer center and medical service on call.  Patient has been accepted in transfer to [facility] by Dr. Samara Snide. He is currently wait listed.  Earleen Newport, MD   Note: This dictation was prepared with Dragon dictation. Any transcriptional errors that result from this process are unintentional    Earleen Newport, MD 04/04/16 1928    Earleen Newport, MD 04/04/16 2119

## 2016-04-05 DIAGNOSIS — M109 Gout, unspecified: Secondary | ICD-10-CM | POA: Diagnosis not present

## 2016-04-05 DIAGNOSIS — K7460 Unspecified cirrhosis of liver: Secondary | ICD-10-CM | POA: Diagnosis not present

## 2016-04-05 DIAGNOSIS — K754 Autoimmune hepatitis: Secondary | ICD-10-CM | POA: Diagnosis not present

## 2016-04-05 DIAGNOSIS — T8643 Liver transplant infection: Secondary | ICD-10-CM | POA: Diagnosis not present

## 2016-04-05 DIAGNOSIS — K920 Hematemesis: Secondary | ICD-10-CM | POA: Diagnosis not present

## 2016-04-05 DIAGNOSIS — N400 Enlarged prostate without lower urinary tract symptoms: Secondary | ICD-10-CM | POA: Diagnosis not present

## 2016-04-05 DIAGNOSIS — K219 Gastro-esophageal reflux disease without esophagitis: Secondary | ICD-10-CM | POA: Diagnosis not present

## 2016-04-05 DIAGNOSIS — K31819 Angiodysplasia of stomach and duodenum without bleeding: Secondary | ICD-10-CM | POA: Diagnosis not present

## 2016-04-05 DIAGNOSIS — Z944 Liver transplant status: Secondary | ICD-10-CM | POA: Diagnosis not present

## 2016-04-05 DIAGNOSIS — E785 Hyperlipidemia, unspecified: Secondary | ICD-10-CM | POA: Diagnosis not present

## 2016-04-05 DIAGNOSIS — K226 Gastro-esophageal laceration-hemorrhage syndrome: Secondary | ICD-10-CM | POA: Diagnosis not present

## 2016-04-05 DIAGNOSIS — Q2733 Arteriovenous malformation of digestive system vessel: Secondary | ICD-10-CM | POA: Diagnosis not present

## 2016-04-05 DIAGNOSIS — I129 Hypertensive chronic kidney disease with stage 1 through stage 4 chronic kidney disease, or unspecified chronic kidney disease: Secondary | ICD-10-CM | POA: Diagnosis not present

## 2016-04-05 DIAGNOSIS — K580 Irritable bowel syndrome with diarrhea: Secondary | ICD-10-CM | POA: Diagnosis not present

## 2016-04-05 DIAGNOSIS — N183 Chronic kidney disease, stage 3 (moderate): Secondary | ICD-10-CM | POA: Diagnosis not present

## 2016-04-05 DIAGNOSIS — Z7982 Long term (current) use of aspirin: Secondary | ICD-10-CM | POA: Diagnosis not present

## 2016-04-05 DIAGNOSIS — R109 Unspecified abdominal pain: Secondary | ICD-10-CM | POA: Diagnosis not present

## 2016-04-05 DIAGNOSIS — Z87442 Personal history of urinary calculi: Secondary | ICD-10-CM | POA: Diagnosis not present

## 2016-04-05 DIAGNOSIS — Z9989 Dependence on other enabling machines and devices: Secondary | ICD-10-CM | POA: Diagnosis not present

## 2016-04-05 DIAGNOSIS — K529 Noninfective gastroenteritis and colitis, unspecified: Secondary | ICD-10-CM | POA: Diagnosis not present

## 2016-04-05 DIAGNOSIS — R6889 Other general symptoms and signs: Secondary | ICD-10-CM | POA: Diagnosis not present

## 2016-04-05 DIAGNOSIS — K317 Polyp of stomach and duodenum: Secondary | ICD-10-CM | POA: Diagnosis not present

## 2016-04-05 DIAGNOSIS — D62 Acute posthemorrhagic anemia: Secondary | ICD-10-CM | POA: Diagnosis not present

## 2016-04-05 DIAGNOSIS — D899 Disorder involving the immune mechanism, unspecified: Secondary | ICD-10-CM | POA: Diagnosis not present

## 2016-04-05 DIAGNOSIS — R768 Other specified abnormal immunological findings in serum: Secondary | ICD-10-CM | POA: Diagnosis not present

## 2016-04-05 LAB — TYPE AND SCREEN
ABO/RH(D): B POS
ANTIBODY SCREEN: POSITIVE

## 2016-04-05 NOTE — ED Notes (Signed)
[facility], spoke with Saralyn Pilar, let them know pt on the way

## 2016-04-06 DIAGNOSIS — R768 Other specified abnormal immunological findings in serum: Secondary | ICD-10-CM | POA: Diagnosis not present

## 2016-04-08 DIAGNOSIS — Z7960 Long term (current) use of unspecified immunomodulators and immunosuppressants: Secondary | ICD-10-CM | POA: Insufficient documentation

## 2016-04-08 DIAGNOSIS — Z79899 Other long term (current) drug therapy: Secondary | ICD-10-CM | POA: Insufficient documentation

## 2016-04-15 ENCOUNTER — Encounter: Payer: Self-pay | Admitting: Family Medicine

## 2016-04-15 ENCOUNTER — Ambulatory Visit (INDEPENDENT_AMBULATORY_CARE_PROVIDER_SITE_OTHER): Payer: Medicare Other | Admitting: Family Medicine

## 2016-04-15 VITALS — BP 104/60 | HR 100 | Temp 97.5°F | Wt 286.5 lb

## 2016-04-15 DIAGNOSIS — K920 Hematemesis: Secondary | ICD-10-CM | POA: Diagnosis not present

## 2016-04-15 DIAGNOSIS — K226 Gastro-esophageal laceration-hemorrhage syndrome: Secondary | ICD-10-CM

## 2016-04-15 MED ORDER — CALCIUM CARBONATE ANTACID 500 MG PO CHEW
1.0000 | CHEWABLE_TABLET | Freq: Two times a day (BID) | ORAL | Status: DC | PRN
Start: 2016-04-15 — End: 2016-06-25

## 2016-04-15 MED ORDER — PREDNISONE 5 MG PO TABS: 5.0000 mg | ORAL_TABLET | Freq: Every day | ORAL | Status: DC

## 2016-04-15 NOTE — Progress Notes (Signed)
Pre visit review using our clinic review tool, if applicable. No additional management support is needed unless otherwise documented below in the visit note. 

## 2016-04-15 NOTE — Patient Instructions (Signed)
Go to the lab on the way out.  We'll contact you with your lab report. Add on as needed tums in the meantime.  If you continue to have abdominal discomfort, then call the [place] [facility] about follow up.  Take care.  Glad to see you.

## 2016-04-15 NOTE — Progress Notes (Signed)
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   Anticipatory Guidance (items to address, including key med changes):   Devin White was admitted for further evaluation of nausea/vomiting c/b 1 episode of hematemesis as an outpatient. Underwent EGD on 04/08/16 by GI that showed 3 non-bleednig AVMs that were treated. Overall thought hematemesis due to Mallory-Weiss Tear that had healed by time of EGD. Started on BID PPI on discharge, and hepatology to arrange follow-up for Devin White.   Note: CMV PCR of blood pending at time of hospital discharge.   Recommended Follow-up Studies: - f/u pending CMV PCR - repeat CBC at 1-2 weeks as an outpatient  Follow-up/Care Transition Plan: Future Appointments Date Time Provider [place]  05/02/2016 8:00 AM LAB(PHLEBOTOMY)- 1D DS1D [facility]  07/12/2016 11:00 AM [place]  09/30/2016 9:00 AM Erenest Rasher, MD [facility]   Non-[facility] Provider Follow-up: none  Allergies/Intolerances:  No Known Allergies   New Adverse Drug Events:  none  Medications:  Current Discharge Medication List   CONTINUE these medications which have CHANGED  Details  omeprazole (PRILOSEC) 20 MG DR capsule Take 1 capsule (20 mg total) by mouth 2 (two) times daily. Qty: 60 capsule, Refills: 0    CONTINUE these medications which have NOT CHANGED  Details  losartan (COZAAR) 50 MG tablet Take 50 mg by mouth once daily.   metoprolol succinate (TOPROL-XL) 50 MG XL tablet Take 50 mg by mouth daily.   mycophenolate (CELLCEPT) 250 mg capsule Take 750 mg am and 750 mg pm Qty: 180 capsule, Refills: 11   predniSONE (DELTASONE) 5 MG tablet Take 1 tablet (5 mg total) by mouth once daily. Qty: 240 tablet, Refills: 6  Associated Diagnoses: Status post liver transplantation (CMS-HCC); Need for prophylactic immunotherapy   tacrolimus (PROGRAF) 0.5 MG capsule Take 1 mg(2cap) in the morning and 1mg (2cap) in the evening. Qty: 120 capsule, Refills: 11  Associated Diagnoses: Status post liver transplantation (CMS-HCC); Need for prophylactic immunotherapy   tamsulosin (FLOMAX) 0.4 mg capsule 1 cap by mouth daily Refills: 11   aspirin 81 mg tablet 1 tab by mouth daily   Brief History of Present Illness:  Devin White is a 60 y.o. male s/p liver transplantation in 11/2007 for NAFLD, c/b autoimmune hepatitis vs atypical rejection (12/30/2014), chronic immunosuppression (MMF, Tacrolimus, Prednisone), HTN, GERD, IBS-D, gout, who is transferred from [facility] for further evaluation of scant hematemesis.   Devin White in [place] until Monday, when he began feeling increased fatigue, malaise, and nausea. Also experienced acute on chronic diarrhea, with 3-5 loose stools daily (has history of IBS-D, and endorses intermittent diarrhea for several weeks). Also had anorexia and poor PO intake over course of week, taking small snacks throughout days, decreased fluid intake. Reports intermittent lightheadedness with exertion but  no syncope. Day prior to admission, he  began having worsening nausea with several episodes of emesis. On one occasion, vomited very small amount of bright red blood (approximately half-dollar size). No significant abdominal pain, although he does endorse intermittent sharp pains of few seconds duration in his R flank. He denies fevers, chills, confusion, melena, hematochezia, swelling in abdomen or LE.  Devin White called liver transplant coordinator on 10/19 who encouraged him to present to local ED. At [facility], labs were unremarkable (at his baseline), and he had no further hematemesis. Vital signs stable at his baseline. ED providers suggested discharge home, but Devin White desired admission to [facility] for further evaluation. _____________________  Hospital Course by Problem:  # Hematemesis likely due to Mallory-Weiss Tear: # 3 AVMs seen on EGD s/p treatment and polyp of fundus: # Nausea, vomiting, and acute on chronic diarrhea with known h/o IBS: Devin White transferred from and OSH after presenting with 1 episode of hematemesis following multiple episodes of nausea/vomiting and acute on chronic diarrhea at home. After presentation to OSH and during hospital stay at [facility], no further episodes of hematemesis and no melana. HgB stable at 15-16, down from his baseline 17-18. His diarrhea, nausea, and emesis resolved as well at time of [facility] admission. Gi was consulted, and underwent on EGD on 04/08/16 that showed 3 non-bleednig AVMs in the stomach that were treated with APC. Overall thought hematemesis due to Mallory-Weiss Tear that had healed by time of EGD. Started on BID PPI on discharge, and hepatology to arrange follow-up for Devin White.   # s/p Orthotopic Liver Transplant for NAFLD (11/2007) # [place] Autoimmune Hepatitis v Atypical Rejection (12/2014) # Chronic Immunosuppression Devin White follows with Dr. Loletha Grayer in outpatient setting, and hepatology was consulted during this hospital stay. No clinical signs of cirrhosis or decompensation during  hospital stay. CMV PCR was sent during hospital stay per recommendation of hepatology, and pending at time of hospital discharge. Was continued on his home immunosuppression: MMF 750mg  BID, Tacrolimus 1mg  BID (trough FK level = 7.1on 10/22) and Prednisone 5mg  daily. Will follow-up with hepatology as an outpatient.   ____________________  Surgeries and Procedures Performed:  04/07/16 - EGD with GI _____________________  Discharge Exam:  BP (!) 158/104  Pulse 63  Temp 36.6 C (97.9 F) (Oral)  Resp 14  Ht 182.9 cm (6')  Wt (!) 130.2 kg (287 lb)  SpO2 96%  BMI 38.92 kg/m   General: Pleasant male, breathing comfortably on RA, NAD Skin: well-healed abdominal scar over abdomen HEENT: no conjunctival injection or scleral icterus. Oropharynx pink and moist, no erythema or lesions. Cardiac: RRR, audible S1/S2, no murmurs, rubs, or gallops. Resp: Non-labored breathing, CTAB, no wheezes, rales, or rhonchi Abd: BS+, soft, protuberant, non-distended, non-TTP in all four quadrants. No HSM. Extrem: WWP, no erythema or edema Neuro: A&O x 3, speech fluent and appropriate  Pertinent Lab Testing:  Recent Labs Lab 04/05/16 0633 04/06/16 0416  NA 141 143  K 3.4* 4.1  CL 106 109*  CO2 27 28  BUN 13 12  CREATININE 1.4* 1.6*  GLUCOSE 96 106  CALCIUM 8.8 8.8   Recent Labs Lab 04/05/16 0633 04/07/16 0639  AST 21 18  ALT 44 40  ALKPHOS 42 41  TBILI 2.1* 1.5   Recent Labs Lab 04/05/16 0633 04/06/16 0416 04/07/16 0639  WBC 6.4 7.3 7.0  HGB 15.4 15.0 15.6  HCT 45.8 44.9 46.6  PLT 147* 148* 147*   Recent Labs Lab 04/05/16 0633  APTT 27.0  INR 1.0    Other Pertinent Labs:  CMV PCR (04/07/16): pending  Pertinent Imaging:  EGD - 04/08/16:  --------------------------- CMV not detected by [facility] records.  D/w pt.   The above was discussed with Devin White. Admitted with hematemesis, thought to be due to Mallory-Weiss tear. EGD done.Still on PPI. Liver transplant history  noted.  previously he had been having some diarrhea.  He wasn't having dry heaves or vomiting before the hematemesis. No blood in stools.  No black stools.  No more vomiting. Still on PPI.  Some mild upper abd pain, slightly worse in the meantime, since discharge from hospital. No jaundice.   PMH and SH reviewed  ROS: Per HPI unless specifically indicated in ROS section   Meds, vitals, and allergies reviewed.   GEN: nad, alert and oriented HEENT: mucous membranes moist NECK: supple w/o LA CV: rrr.  no  PULM: ctab, no inc wob ABD: soft, +bs, minimally ttp in the upper abd.  Old scarring noted.  EXT: no edema SKIN: no acute rash No jaundice.

## 2016-04-16 DIAGNOSIS — K226 Gastro-esophageal laceration-hemorrhage syndrome: Secondary | ICD-10-CM | POA: Insufficient documentation

## 2016-04-16 LAB — CBC WITH DIFFERENTIAL/PLATELET
Basophils Absolute: 0 10*3/uL (ref 0.0–0.1)
Basophils Relative: 0.2 % (ref 0.0–3.0)
EOS ABS: 0.1 10*3/uL (ref 0.0–0.7)
EOS PCT: 0.5 % (ref 0.0–5.0)
HEMATOCRIT: 51 % (ref 39.0–52.0)
HEMOGLOBIN: 17 g/dL (ref 13.0–17.0)
LYMPHS PCT: 9.9 % — AB (ref 12.0–46.0)
Lymphs Abs: 1.1 10*3/uL (ref 0.7–4.0)
MCHC: 33.3 g/dL (ref 30.0–36.0)
MCV: 96.4 fl (ref 78.0–100.0)
MONO ABS: 0.8 10*3/uL (ref 0.1–1.0)
Monocytes Relative: 7.8 % (ref 3.0–12.0)
Neutro Abs: 8.7 10*3/uL — ABNORMAL HIGH (ref 1.4–7.7)
Neutrophils Relative %: 81.6 % — ABNORMAL HIGH (ref 43.0–77.0)
Platelets: 181 10*3/uL (ref 150.0–400.0)
RBC: 5.29 Mil/uL (ref 4.22–5.81)
RDW: 14.4 % (ref 11.5–15.5)
WBC: 10.6 10*3/uL — AB (ref 4.0–10.5)

## 2016-04-16 LAB — COMPREHENSIVE METABOLIC PANEL
ALBUMIN: 4.3 g/dL (ref 3.5–5.2)
ALK PHOS: 48 U/L (ref 39–117)
ALT: 39 U/L (ref 0–53)
AST: 5 U/L (ref 0–37)
BILIRUBIN TOTAL: 1.5 mg/dL — AB (ref 0.2–1.2)
BUN: 11 mg/dL (ref 6–23)
CALCIUM: 9.8 mg/dL (ref 8.4–10.5)
CO2: 27 mEq/L (ref 19–32)
CREATININE: 1.58 mg/dL — AB (ref 0.40–1.50)
Chloride: 107 mEq/L (ref 96–112)
GFR: 48.07 mL/min — ABNORMAL LOW (ref >60.00)
Glucose, Bld: 131 mg/dL — ABNORMAL HIGH (ref 70–99)
Potassium: 5.1 mEq/L (ref 3.5–5.1)
SODIUM: 144 meq/L (ref 135–145)
TOTAL PROTEIN: 6.7 g/dL (ref 6.0–8.3)

## 2016-04-16 LAB — LIPASE: Lipase: 14 U/L (ref 11.0–59.0)

## 2016-04-16 NOTE — Assessment & Plan Note (Signed)
Liver transplant history noted, on antirejection medications, including prednisone. Admitted with Mallory-Weiss tear as the presumed cause of hematemesis. No more hematemesis in the meantime. No blood in stool. No black stools. Recheck CBC today. Still with some upper abdominal discomfort. No jaundice. Nontoxic. It could be that he is still having symptoms related to prednisone use. I don't when necessary Tums. Check liver tests along with lipase today. Okay for outpatient follow-up. If he does not improve with Tums I want him to call clinic at [facility] for follow-up. He agrees. >25 minutes spent in face to face time with patient, >50% spent in counselling or coordination of care.

## 2016-04-17 ENCOUNTER — Telehealth: Payer: Self-pay | Admitting: Family Medicine

## 2016-04-17 NOTE — Telephone Encounter (Signed)
Patient returned call.  Patient notified of lab results.

## 2016-05-02 DIAGNOSIS — Z944 Liver transplant status: Secondary | ICD-10-CM | POA: Diagnosis not present

## 2016-05-02 DIAGNOSIS — D899 Disorder involving the immune mechanism, unspecified: Secondary | ICD-10-CM | POA: Diagnosis not present

## 2016-06-25 ENCOUNTER — Other Ambulatory Visit: Payer: Self-pay | Admitting: Family Medicine

## 2016-06-25 ENCOUNTER — Other Ambulatory Visit: Payer: Self-pay | Admitting: Internal Medicine

## 2016-06-26 ENCOUNTER — Ambulatory Visit (INDEPENDENT_AMBULATORY_CARE_PROVIDER_SITE_OTHER): Payer: Medicare Other | Admitting: Family Medicine

## 2016-06-26 ENCOUNTER — Encounter: Payer: Self-pay | Admitting: Family Medicine

## 2016-06-26 VITALS — BP 128/90 | HR 90 | Temp 98.1°F | Wt 283.0 lb

## 2016-06-26 DIAGNOSIS — J069 Acute upper respiratory infection, unspecified: Secondary | ICD-10-CM | POA: Diagnosis not present

## 2016-06-26 MED ORDER — AZITHROMYCIN 250 MG PO TABS: ORAL_TABLET | ORAL | 0 refills | Status: DC

## 2016-06-26 MED ORDER — HYDROCODONE-HOMATROPINE 5-1.5 MG/5ML PO SYRP: 5.0000 mL | ORAL_SOLUTION | Freq: Three times a day (TID) | ORAL | 0 refills | Status: DC | PRN

## 2016-06-26 MED ORDER — CALCIUM CARBONATE ANTACID 500 MG PO CHEW: 1.0000 | CHEWABLE_TABLET | Freq: Two times a day (BID) | ORAL | 1 refills | Status: DC | PRN

## 2016-06-26 NOTE — Progress Notes (Signed)
Subjective:    Patient ID: Devin White, male    DOB: 10/20/1955, 61 y.o.   MRN: [id number]  HPI This is a 61 yo male who presents today with cough and nasal congestion x 5 days. Symptoms started with nasal drainage, sore throat. No ear pain, some headache over eyes across eyes. Nasal drainage is clear, thin. Cough productive of thick sputum. Hears some wheezes, back hurting with cough. Rarely gets URI symptoms.   Has been at hospital with great grandson who had open heart surgery. He is 47 months old and came home from the hospital yesterday.   Past Medical History:  Diagnosis Date  . Benign prostatic hypertrophy   . GERD (gastroesophageal reflux disease)   . Gout   . History of blood transfusion    pt has antibodies in his blood since previous transfusions  . History of cirrhosis of liver S/P TRANSPLANT 2009  . History of liver failure S/P TRANSPLANT  . Hypertension   . Left ureteral calculus    Past Surgical History:  Procedure Laterality Date  . bone morrow biopsy    . CHOLECYSTECTOMY  2007  . CYSTO/ LEFT RETROGRADE PYELOGRAM/ LEFT URETERAL STENT PLACEMENT  03-05-2012  DR Tresa Moore [facility])   LEFT URETERAL CALCULI  . CYSTOSCOPY W/ URETERAL STENT PLACEMENT  03/11/2012   Procedure: CYSTOSCOPY WITH STENT REPLACEMENT;  Surgeon: Alexis Frock, MD;  Location: [facility];  Service: Urology;  Laterality: Left;  . HERNIA REPAIR  2008  . LIVER BIOPSY    . LIVER TRANSPLANT  11/24/2007   pt states doing well since liver transplant  . LUMBAR DISC SURGERY    . LUMBAR FUSION    . removal of fistula    . URETEROSCOPY  03/11/2012   Procedure: URETEROSCOPY;  Surgeon: Alexis Frock, MD;  Location: [facility];  Service: Urology;  Laterality: Left;   STONE MANIPULATION, stone obtained  Devin White   Family History  Problem Relation Age of Onset  . Cancer Mother     colon  . Arthritis Mother   . Vasculitis Mother   . Hypertension Mother   . Cancer  Father     colon  . Kidney disease Father   . Arthritis Father   . Arthritis Brother   . Alcohol abuse Maternal Aunt   . Diabetes Maternal Aunt   . Alcohol abuse Maternal Uncle   . Diabetes Paternal Aunt   . Alcohol abuse Maternal Uncle   . Alcohol abuse Maternal Uncle    Social History  Substance Use Topics  . Smoking status: Never Smoker  . Smokeless tobacco: Never Used  . Alcohol use No      Review of Systems Per HPI    Objective:   Physical Exam  Constitutional: He is oriented to person, place, and time. He appears well-developed and well-nourished. No distress.  HENT:  Head: Normocephalic and atraumatic.  Right Ear: Tympanic membrane, external ear and ear canal normal.  Left Ear: Tympanic membrane, external ear and ear canal normal.  Nose: Mucosal edema and rhinorrhea present.  Mouth/Throat: Uvula is midline. No oropharyngeal exudate or posterior oropharyngeal edema.  Sounds congested.    Eyes: Right eye exhibits discharge. Left eye exhibits discharge.  Watery discharge.   Neck: Normal range of motion.  Cardiovascular: Normal rate, regular rhythm and normal heart sounds.   Pulmonary/Chest: Effort normal and breath sounds normal.  Frequent, deep, wet sounding cough.   Lymphadenopathy:    He has no  cervical adenopathy.  Neurological: He is alert and oriented to person, place, and time.  Skin: Skin is warm and dry. He is not diaphoretic.  Psychiatric: He has a normal mood and affect. His behavior is normal. Judgment and thought content normal.  Vitals reviewed.     BP 128/90   Pulse 90   Temp 98.1 F (36.7 C)   Wt 283 lb (128.4 kg)   SpO2 96%   BMI 37.34 kg/m  Wt Readings from Last 3 Encounters:  06/26/16 283 lb (128.4 kg)  04/15/16 286 lb 8 oz (130 kg)  04/04/16 290 lb (131.5 kg)       Assessment & Plan:  1. Acute upper respiratory infection - will cover for bacterial infection given recent time spent in hospital setting and immunocompromised  state - RTC precautions reviewed -  Patient Instructions  Please start your antibiotic today For nasal congestion you can use Afrin nasal spray for 3 days max, saline nasal spray (generic is fine for all). For cough, I have provided a written prescription for cough syrup Drink enough fluids to make your urine light yellow. For fever/chill/muscle aches you can take over the counter acetaminophen or ibuprofen.  Please come back in if you are not better in 5-7 days or if you develop wheezing, shortness of breath or persistent vomiting.   - azithromycin (ZITHROMAX) 250 MG tablet; Take 2 tabs PO x 1 dose, then 1 tab PO QD x 4 days  Dispense: 6 tablet; Refill: 0 - HYDROcodone-homatropine (HYCODAN) 5-1.5 MG/5ML syrup; Take 5 mLs by mouth every 8 (eight) hours as needed for cough.  Dispense: 120 mL; Refill: 0   Clarene Reamer, FNP-BC  [facility]  06/26/2016 4:08 PM

## 2016-06-26 NOTE — Patient Instructions (Signed)
Please start your antibiotic today For nasal congestion you can use Afrin nasal spray for 3 days max, saline nasal spray (generic is fine for all). For cough, I have provided a written prescription for cough syrup Drink enough fluids to make your urine light yellow. For fever/chill/muscle aches you can take over the counter acetaminophen or ibuprofen.  Please come back in if you are not better in 5-7 days or if you develop wheezing, shortness of breath or persistent vomiting.

## 2016-08-02 DIAGNOSIS — Z944 Liver transplant status: Secondary | ICD-10-CM | POA: Diagnosis not present

## 2016-08-02 DIAGNOSIS — Z298 Encounter for other specified prophylactic measures: Secondary | ICD-10-CM | POA: Diagnosis not present

## 2016-09-30 DIAGNOSIS — K754 Autoimmune hepatitis: Secondary | ICD-10-CM | POA: Diagnosis not present

## 2016-09-30 DIAGNOSIS — D899 Disorder involving the immune mechanism, unspecified: Secondary | ICD-10-CM | POA: Diagnosis not present

## 2016-09-30 DIAGNOSIS — I10 Essential (primary) hypertension: Secondary | ICD-10-CM | POA: Diagnosis not present

## 2016-09-30 DIAGNOSIS — T8643 Liver transplant infection: Secondary | ICD-10-CM | POA: Diagnosis not present

## 2016-09-30 DIAGNOSIS — Z4823 Encounter for aftercare following liver transplant: Secondary | ICD-10-CM | POA: Diagnosis not present

## 2016-09-30 DIAGNOSIS — Z79899 Other long term (current) drug therapy: Secondary | ICD-10-CM | POA: Diagnosis not present

## 2016-09-30 DIAGNOSIS — Z944 Liver transplant status: Secondary | ICD-10-CM | POA: Diagnosis not present

## 2016-10-09 ENCOUNTER — Encounter: Payer: Self-pay | Admitting: Internal Medicine

## 2016-10-09 ENCOUNTER — Ambulatory Visit (INDEPENDENT_AMBULATORY_CARE_PROVIDER_SITE_OTHER): Payer: Medicare Other | Admitting: Internal Medicine

## 2016-10-09 VITALS — BP 124/80 | HR 94 | Temp 97.4°F | Ht 73.0 in | Wt 282.0 lb

## 2016-10-09 DIAGNOSIS — K226 Gastro-esophageal laceration-hemorrhage syndrome: Secondary | ICD-10-CM

## 2016-10-09 DIAGNOSIS — K7210 Chronic hepatic failure without coma: Secondary | ICD-10-CM | POA: Diagnosis not present

## 2016-10-09 DIAGNOSIS — I10 Essential (primary) hypertension: Secondary | ICD-10-CM

## 2016-10-09 DIAGNOSIS — Z0000 Encounter for general adult medical examination without abnormal findings: Secondary | ICD-10-CM

## 2016-10-09 DIAGNOSIS — N183 Chronic kidney disease, stage 3 unspecified: Secondary | ICD-10-CM

## 2016-10-09 DIAGNOSIS — N1832 Chronic kidney disease, stage 3b: Secondary | ICD-10-CM | POA: Insufficient documentation

## 2016-10-09 MED ORDER — OMEPRAZOLE 20 MG PO CPDR: DELAYED_RELEASE_CAPSULE | ORAL | 0 refills | Status: DC

## 2016-10-09 NOTE — Assessment & Plan Note (Signed)
I have personally reviewed the Medicare Annual Wellness questionnaire and have noted 1. The patient's medical and social history 2. Their use of alcohol, tobacco or illicit drugs 3. Their current medications and supplements 4. The patient's functional ability including ADL's, fall risks, home safety risks and hearing or visual             impairment. 5. Diet and physical activities 6. Evidence for depression or mood disorders  The patients weight, height, BMI and visual acuity have been recorded in the chart I have made referrals, counseling and provided education to the patient based review of the above and I have provided the pt with a written personalized care plan for preventive services.  I have provided you with a copy of your personalized plan for preventive services. Please take the time to review along with your updated medication list.  Colon due in 2020 or so (polyps) Discussed PSA--he wishes to defer again Annual flu vaccine Discussed fitness Pneumovax booster in December

## 2016-10-09 NOTE — Assessment & Plan Note (Signed)
Mild Gets labs regularly at [facility] On losartan

## 2016-10-09 NOTE — Progress Notes (Signed)
Subjective:    Patient ID: Devin White, male    DOB: 12/30/1955, 61 y.o.   MRN: [id number]  HPI Here for Medicare wellness and follow up of chronic health conditions Reviewed form and advanced directives Reviewed other doctors---[facility], Dr Mortimer Fries for sleep apnea, supposed to see [facility] hematologist also. Dentist--none in a while.  eye doctor (?name) Trying to exercise regularly--more walking No tobacco or alcohol Vision and hearing are fine Independent with instrumental ADLs Memory continues to be good No falls  He has no new concerns Just had yearly [facility] home visit  1 hospitalization this year for hematemesis Mallory Weiss tear prilosec now only once a day No heartburn or dysphagia  Is having some reduction of anti rejection meds for his liver Recent visit at [facility] May need blood work next week  BP has been fine No chest pain or SOB No dizziness or syncope No edema Mild CKD--is on losartan  Voids okay Prostate has not been a problem Occasional nocturia No daytime problems  Current Outpatient Prescriptions on File Prior to Visit  Medication Sig Dispense Refill  . aspirin EC 81 MG tablet Take 81 mg by mouth daily.    . calcium carbonate (TUMS) 500 MG chewable tablet Chew 1 tablet (200 mg of elemental calcium total) by mouth 2 (two) times daily as needed for indigestion or heartburn. 180 tablet 1  . losartan (COZAAR) 50 MG tablet TAKE 1 TABLET BY MOUTH DAILY 90 tablet 2  . metoprolol succinate (TOPROL-XL) 50 MG 24 hr tablet TAKE 1 TABLET BY MOUTH DAILY 30 tablet 11  . mycophenolate (CELLCEPT) 250 MG capsule Take 1,500 mg by mouth daily.     . nitroGLYCERIN (NITROSTAT) 0.4 MG SL tablet Place 1 tablet (0.4 mg total) under the tongue every 5 (five) minutes as needed for chest pain. 25 tablet 6  . predniSONE (DELTASONE) 5 MG tablet Take 1 tablet (5 mg total) by mouth daily with breakfast.    . tacrolimus (PROGRAF) 1 MG capsule Take 1 mg by mouth 2 (two)  times daily.     . tamsulosin (FLOMAX) 0.4 MG CAPS capsule TAKE ONE (1) CAPSULE BY MOUTH EACH DAY 90 capsule 3   No current facility-administered medications on file prior to visit.     No Known Allergies  Past Medical History:  Diagnosis Date  . Benign prostatic hypertrophy   . GERD (gastroesophageal reflux disease)   . Gout   . History of blood transfusion    pt has antibodies in his blood since previous transfusions  . History of cirrhosis of liver S/P TRANSPLANT 2009  . History of liver failure S/P TRANSPLANT  . Hypertension   . Left ureteral calculus     Past Surgical History:  Procedure Laterality Date  . bone morrow biopsy    . CHOLECYSTECTOMY  2007  . CYSTO/ LEFT RETROGRADE PYELOGRAM/ LEFT URETERAL STENT PLACEMENT  03-05-2012  DR Tresa Moore [facility])   LEFT URETERAL CALCULI  . CYSTOSCOPY W/ URETERAL STENT PLACEMENT  03/11/2012   Procedure: CYSTOSCOPY WITH STENT REPLACEMENT;  Surgeon: Alexis Frock, MD;  Location: [facility];  Service: Urology;  Laterality: Left;  . HERNIA REPAIR  2008  . LIVER BIOPSY    . LIVER TRANSPLANT  11/24/2007   pt states doing well since liver transplant  . LUMBAR DISC SURGERY    . LUMBAR FUSION    . removal of fistula    . URETEROSCOPY  03/11/2012   Procedure: URETEROSCOPY;  Surgeon: Hubbard Robinson  Tresa Moore, MD;  Location: [facility];  Service: Urology;  Laterality: Left;   STONE MANIPULATION, stone obtained  [place] [facility]    Family History  Problem Relation Age of Onset  . Cancer Mother     colon  . Arthritis Mother   . Vasculitis Mother   . Hypertension Mother   . Cancer Father     colon  . Kidney disease Father   . Arthritis Father   . Arthritis Brother   . Alcohol abuse Maternal Aunt   . Diabetes Maternal Aunt   . Alcohol abuse Maternal Uncle   . Diabetes Paternal Aunt   . Alcohol abuse Maternal Uncle   . Alcohol abuse Maternal Uncle     Social History   Social History  . Marital status: Divorced      Spouse name: N/A  . Number of children: 3  . Years of education: N/A   Occupational History  . disabled Retired   Social History Main Topics  . Smoking status: Never Smoker  . Smokeless tobacco: Never Used  . Alcohol use No  . Drug use: No  . Sexual activity: Not on file   Other Topics Concern  . Not on file   Social History Narrative   Has living will   Requests daughter Devin White as his health care POA   Would accept brief attempt at resuscitation but no prolonged ventilation   No tube feeds if cognitively unaware   Review of Systems Has lost ~15# ---more energy and feels better Appetite is good Sleeps well Wears seat belt Teeth okay---overdue now (2 years) Bowels fine. No blood Some shoulder pain--after it rains. Occasionally takes tylenol No rash or suspicious skin lesions    Objective:   Physical Exam  Constitutional: He is oriented to person, place, and time. He appears well-nourished. No distress.  HENT:  Mouth/Throat: Oropharynx is clear and moist. No oropharyngeal exudate.  Neck: No thyromegaly present.  Cardiovascular: Normal rate, regular rhythm, normal heart sounds and intact distal pulses.  Exam reveals no gallop.   No murmur heard. Pulmonary/Chest: Effort normal and breath sounds normal. No respiratory distress. He has no wheezes. He has no rales.  Abdominal: Soft. There is no tenderness.  Musculoskeletal: He exhibits no edema or tenderness.  Lymphadenopathy:    He has no cervical adenopathy.  Neurological: He is alert and oriented to person, place, and time.  President--- "Trump, Obama, Clinton----Bush" [phone] [id number] Recall 3/3  Skin: No rash noted. No erythema.  Psychiatric: He has a normal mood and affect. His behavior is normal.          Assessment & Plan:

## 2016-10-09 NOTE — Assessment & Plan Note (Signed)
No recurrent bleeding Will continue the PPI

## 2016-10-09 NOTE — Assessment & Plan Note (Signed)
Continues with the transplant center May need cellcept level next week

## 2016-10-09 NOTE — Progress Notes (Signed)
Pre visit review using our clinic review tool, if applicable. No additional management support is needed unless otherwise documented below in the visit note. 

## 2016-10-09 NOTE — Assessment & Plan Note (Signed)
BP Readings from Last 3 Encounters:  10/09/16 124/80  06/26/16 128/90  04/15/16 104/60   Good control No change needed

## 2016-10-16 DIAGNOSIS — D899 Disorder involving the immune mechanism, unspecified: Secondary | ICD-10-CM | POA: Diagnosis not present

## 2016-10-16 DIAGNOSIS — Z944 Liver transplant status: Secondary | ICD-10-CM | POA: Diagnosis not present

## 2016-10-25 DIAGNOSIS — Z944 Liver transplant status: Secondary | ICD-10-CM | POA: Diagnosis not present

## 2016-10-25 DIAGNOSIS — Z298 Encounter for other specified prophylactic measures: Secondary | ICD-10-CM | POA: Diagnosis not present

## 2016-11-04 ENCOUNTER — Telehealth: Payer: Self-pay | Admitting: *Deleted

## 2016-11-04 MED ORDER — COLCHICINE 0.6 MG PO TABS: 0.6000 mg | ORAL_TABLET | Freq: Two times a day (BID) | ORAL | 0 refills | Status: DC | PRN

## 2016-11-04 NOTE — Telephone Encounter (Signed)
Sent.  Thanks.   Use as PRN, not daily o/w.

## 2016-11-04 NOTE — Telephone Encounter (Signed)
Patient advised.

## 2016-11-04 NOTE — Telephone Encounter (Signed)
Spoke to pt who states that he is having a gout flareup and is requesting a refill of colchicine. Medication is not on pts current medication list, but was advised to contact office should he need a refill. Last OV 09/2016-CPE.  pls advise

## 2016-11-25 DIAGNOSIS — Z944 Liver transplant status: Secondary | ICD-10-CM | POA: Diagnosis not present

## 2016-11-25 DIAGNOSIS — D899 Disorder involving the immune mechanism, unspecified: Secondary | ICD-10-CM | POA: Diagnosis not present

## 2016-11-28 ENCOUNTER — Other Ambulatory Visit: Payer: Self-pay | Admitting: Internal Medicine

## 2016-12-29 ENCOUNTER — Other Ambulatory Visit: Payer: Self-pay | Admitting: Internal Medicine

## 2017-02-25 DIAGNOSIS — Z944 Liver transplant status: Secondary | ICD-10-CM | POA: Diagnosis not present

## 2017-02-25 DIAGNOSIS — D899 Disorder involving the immune mechanism, unspecified: Secondary | ICD-10-CM | POA: Diagnosis not present

## 2017-03-11 DIAGNOSIS — Z944 Liver transplant status: Secondary | ICD-10-CM | POA: Diagnosis not present

## 2017-03-11 DIAGNOSIS — D899 Disorder involving the immune mechanism, unspecified: Secondary | ICD-10-CM | POA: Diagnosis not present

## 2017-04-07 DIAGNOSIS — Z79899 Other long term (current) drug therapy: Secondary | ICD-10-CM | POA: Diagnosis not present

## 2017-04-07 DIAGNOSIS — Z23 Encounter for immunization: Secondary | ICD-10-CM | POA: Diagnosis not present

## 2017-04-07 DIAGNOSIS — Z944 Liver transplant status: Secondary | ICD-10-CM | POA: Diagnosis not present

## 2017-04-07 DIAGNOSIS — D899 Disorder involving the immune mechanism, unspecified: Secondary | ICD-10-CM | POA: Diagnosis not present

## 2017-04-07 DIAGNOSIS — I10 Essential (primary) hypertension: Secondary | ICD-10-CM | POA: Diagnosis not present

## 2017-04-07 DIAGNOSIS — Z4823 Encounter for aftercare following liver transplant: Secondary | ICD-10-CM | POA: Diagnosis not present

## 2017-07-03 ENCOUNTER — Other Ambulatory Visit: Payer: Self-pay | Admitting: Internal Medicine

## 2017-08-11 ENCOUNTER — Other Ambulatory Visit: Payer: Self-pay | Admitting: Internal Medicine

## 2017-08-11 MED ORDER — COLCHICINE 0.6 MG PO TABS: 0.6000 mg | ORAL_TABLET | Freq: Two times a day (BID) | ORAL | 0 refills | Status: DC | PRN

## 2017-08-11 NOTE — Telephone Encounter (Signed)
Copied from [place] [phone]. Topic: General - Other >> Aug 11, 2017 11:57 AM Darl Householder, RMA wrote: Reason for CRM: Medication refill request for colchicine 0.6 MG to be sent to [facility] [address]

## 2017-08-11 NOTE — Telephone Encounter (Signed)
LOV 10/09/16  Dr. Silvio Pate  [facility] on [address] That is not in [employer]  (Called pt and got clarification).

## 2017-08-23 ENCOUNTER — Other Ambulatory Visit: Payer: Self-pay | Admitting: Internal Medicine

## 2017-08-25 ENCOUNTER — Encounter: Payer: Self-pay | Admitting: Family Medicine

## 2017-08-25 ENCOUNTER — Ambulatory Visit (INDEPENDENT_AMBULATORY_CARE_PROVIDER_SITE_OTHER): Payer: Medicare Other | Admitting: Family Medicine

## 2017-08-25 VITALS — BP 132/78 | HR 90 | Temp 98.2°F | Ht 73.0 in | Wt 272.5 lb

## 2017-08-25 DIAGNOSIS — J209 Acute bronchitis, unspecified: Secondary | ICD-10-CM | POA: Diagnosis not present

## 2017-08-25 MED ORDER — AZITHROMYCIN 250 MG PO TABS: ORAL_TABLET | ORAL | 0 refills | Status: DC

## 2017-08-25 MED ORDER — BENZONATATE 200 MG PO CAPS: 200.0000 mg | ORAL_CAPSULE | Freq: Three times a day (TID) | ORAL | 1 refills | Status: DC | PRN

## 2017-08-25 MED ORDER — ALBUTEROL SULFATE HFA 108 (90 BASE) MCG/ACT IN AERS: 2.0000 | INHALATION_SPRAY | RESPIRATORY_TRACT | 0 refills | Status: DC | PRN

## 2017-08-25 NOTE — Patient Instructions (Signed)
Take the zpak as directed for bronchitis Drink lots of fluids  Rest when you can Try the tessalon for cough as needed  The inhaler for wheezing if needed -see hand out   Update if not starting to improve in a week or if worsening

## 2017-08-25 NOTE — Progress Notes (Signed)
Subjective:    Patient ID: Devin White, male    DOB: [birth year], 62 y.o.   MRN: [id number]  HPI Here for symptoms of cough and congestion   Sick since Wednesday =runny nose  Then pnd  Coughing - sore in the mid back  Cough is mildly productive - small amt of phlegm/ some color to it  No fever  Throat is a little scratchy  Ears feel ok   Some wheezing  No hx of lung disease and non smoker   Temp: 98.2 F (36.8 C)    Takes prograf and prednisone and cellcept to prevent organ rejection (liver transplant)   Patient Active Problem List   Diagnosis Date Noted  . Acute bronchitis 08/25/2017  . Chronic kidney disease, stage III (moderate) ([place]) 10/09/2016  . Mallory-Weiss tear 04/16/2016  . Long-term use of immunosuppressant medication 04/08/2016  . Sleep apnea 10/06/2015  . 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 Ureteral stone with hydronephrosis 03/02/2012  . Hypertension   . Routine general medical examination at a health care facility 04/26/2011  . Chronic liver failure ([place]) 09/24/2010  . GOUT 12/21/2008  . BENIGN PROSTATIC HYPERTROPHY 12/21/2008  . ALLERGIC RHINITIS 05/22/2007  . GERD 05/22/2007  . HIATAL HERNIA 05/22/2007  . IRRITABLE BOWEL SYNDROME, HX OF 05/22/2007  . RENAL CALCULUS, HX OF 05/22/2007   Past Medical History:  Diagnosis Date  . Benign prostatic hypertrophy   . GERD (gastroesophageal reflux disease)   . Gout   . History of blood transfusion    pt has antibodies in his blood since previous transfusions  . History of cirrhosis of liver S/P TRANSPLANT 2009  . History of liver failure S/P TRANSPLANT  . Hypertension   . Left ureteral calculus    Past Surgical History:  Procedure Laterality Date  . bone morrow biopsy    . CHOLECYSTECTOMY  2007  . CYSTO/ LEFT RETROGRADE PYELOGRAM/ LEFT URETERAL STENT PLACEMENT  03-05-2012  DR Tresa Moore [facility])   LEFT URETERAL CALCULI  . CYSTOSCOPY W/ URETERAL STENT PLACEMENT   03/11/2012   Procedure: CYSTOSCOPY WITH STENT REPLACEMENT;  Surgeon: Alexis Frock, MD;  Location: [facility];  Service: Urology;  Laterality: Left;  . HERNIA REPAIR  2008  . LIVER BIOPSY    . LIVER TRANSPLANT  11/24/2007   pt states doing well since liver transplant  . LUMBAR DISC SURGERY    . LUMBAR FUSION    . removal of fistula    . URETEROSCOPY  03/11/2012   Procedure: URETEROSCOPY;  Surgeon: Alexis Frock, MD;  Location: [facility];  Service: Urology;  Laterality: Left;   STONE MANIPULATION, stone obtained  [phone] [facility]   Social History   Tobacco Use  . Smoking status: Never Smoker  . Smokeless tobacco: Never Used  Substance Use Topics  . Alcohol use: No  . Drug use: No   Family History  Problem Relation Age of Onset  . Cancer Mother        colon  . Arthritis Mother   . Vasculitis Mother   . Hypertension Mother   . Cancer Father        colon  . Kidney disease Father   . Arthritis Father   . Arthritis Brother   . Alcohol abuse Maternal Aunt   . Diabetes Maternal Aunt   . Alcohol abuse Maternal Uncle   . Diabetes Paternal Aunt   . Alcohol abuse Maternal Uncle   . Alcohol  abuse Maternal Uncle    No Known Allergies Current Outpatient Medications on File Prior to Visit  Medication Sig Dispense Refill  . aspirin EC 81 MG tablet Take 81 mg by mouth daily.    . calcium carbonate (TUMS) 500 MG chewable tablet Chew 1 tablet (200 mg of elemental calcium total) by mouth 2 (two) times daily as needed for indigestion or heartburn. 180 tablet 1  . colchicine 0.6 MG tablet TAKE 1 TABLET BY MOUTH 2 (TWO) TIMES DAILY AS NEEDED (FOR GOUT). 30 tablet 0  . losartan (COZAAR) 50 MG tablet TAKE 1 TABLET BY MOUTH DAILY 90 tablet 3  . metoprolol succinate (TOPROL-XL) 50 MG 24 hr tablet TAKE 1 TABLET BY MOUTH DAILY 30 tablet 11  . mycophenolate (CELLCEPT) 250 MG capsule Take 500 mg by mouth daily.     . nitroGLYCERIN (NITROSTAT) 0.4 MG SL tablet  Place 1 tablet (0.4 mg total) under the tongue every 5 (five) minutes as needed for chest pain. 25 tablet 6  . omeprazole (PRILOSEC) 20 MG capsule TAKE ONE (1) CAPSULE DAILY 1 capsule 0  . omeprazole (PRILOSEC) 20 MG capsule TAKE 1 CAPSULE BY MOUTH TWICE A DAY 180 capsule 1  . predniSONE (DELTASONE) 5 MG tablet Take 1 tablet (5 mg total) by mouth daily with breakfast.    . tacrolimus (PROGRAF) 1 MG capsule Take 1 mg by mouth 2 (two) times daily.     . tamsulosin (FLOMAX) 0.4 MG CAPS capsule TAKE ONE CAPSULE BY MOUTH DAILY 90 capsule 3   No current facility-administered medications on file prior to visit.     Review of Systems  Constitutional: Positive for appetite change and fatigue. Negative for fever.  HENT: Positive for congestion, postnasal drip, rhinorrhea, sinus pressure, sneezing and sore throat. Negative for ear pain.   Eyes: Negative for pain and discharge.  Respiratory: Positive for cough. Negative for shortness of breath, wheezing and stridor.   Cardiovascular: Negative for chest pain.  Gastrointestinal: Negative for diarrhea, nausea and vomiting.  Genitourinary: Negative for frequency, hematuria and urgency.  Musculoskeletal: Negative for arthralgias and myalgias.  Skin: Negative for rash.  Neurological: Positive for headaches. Negative for dizziness, weakness and light-headedness.  Psychiatric/Behavioral: Negative for confusion and dysphoric mood.       Objective:   Physical Exam  Constitutional: He appears well-developed and well-nourished. No distress.  overwt and well appearing   HENT:  Head: Normocephalic and atraumatic.  Right Ear: External ear normal.  Left Ear: External ear normal.  Mouth/Throat: Oropharynx is clear and moist.  Nares are injected and congested  No sinus tenderness Clear rhinorrhea and post nasal drip   Eyes: Conjunctivae and EOM are normal. Pupils are equal, round, and reactive to light. Right eye exhibits no discharge. Left eye exhibits no  discharge.  Neck: Normal range of motion. Neck supple.  Cardiovascular: Normal rate and normal heart sounds.  Pulmonary/Chest: Effort normal and breath sounds normal. No respiratory distress. He has no wheezes. He has no rales. He exhibits no tenderness.  Harsh bs Upper airway sounds Scattered rhonchi  Wheeze on forced exp only No rales   Lymphadenopathy:    He has no cervical adenopathy.  Neurological: He is alert. No cranial nerve deficit.  Skin: Skin is warm and dry. No rash noted. No pallor.  Psychiatric: He has a normal mood and affect.          Assessment & Plan:   Problem List Items Addressed This Visit  Respiratory   Acute bronchitis - Primary    In pt on immunosuppressive medications incl prednisone  Re assuring exam  Some symptoms of reactive airways-px albuterol mdi with inst for use zpak Tessalon Fluids/rest Disc symptomatic care - see instructions on AVS  Update if not starting to improve in a week or if worsening

## 2017-08-25 NOTE — Assessment & Plan Note (Signed)
In pt on immunosuppressive medications incl prednisone  Re assuring exam  Some symptoms of reactive airways-px albuterol mdi with inst for use zpak Tessalon Fluids/rest Disc symptomatic care - see instructions on AVS  Update if not starting to improve in a week or if worsening

## 2017-09-08 DIAGNOSIS — Z944 Liver transplant status: Secondary | ICD-10-CM | POA: Diagnosis not present

## 2017-09-08 DIAGNOSIS — D899 Disorder involving the immune mechanism, unspecified: Secondary | ICD-10-CM | POA: Diagnosis not present

## 2017-09-15 DIAGNOSIS — D899 Disorder involving the immune mechanism, unspecified: Secondary | ICD-10-CM | POA: Diagnosis not present

## 2017-09-15 DIAGNOSIS — Z944 Liver transplant status: Secondary | ICD-10-CM | POA: Diagnosis not present

## 2017-09-15 DIAGNOSIS — R197 Diarrhea, unspecified: Secondary | ICD-10-CM | POA: Diagnosis not present

## 2017-09-29 DIAGNOSIS — E876 Hypokalemia: Secondary | ICD-10-CM | POA: Diagnosis not present

## 2017-09-29 DIAGNOSIS — Z79899 Other long term (current) drug therapy: Secondary | ICD-10-CM | POA: Diagnosis not present

## 2017-09-29 DIAGNOSIS — R945 Abnormal results of liver function studies: Secondary | ICD-10-CM | POA: Diagnosis not present

## 2017-09-29 DIAGNOSIS — D899 Disorder involving the immune mechanism, unspecified: Secondary | ICD-10-CM | POA: Diagnosis not present

## 2017-09-29 DIAGNOSIS — T8643 Liver transplant infection: Secondary | ICD-10-CM | POA: Diagnosis not present

## 2017-09-29 DIAGNOSIS — Z944 Liver transplant status: Secondary | ICD-10-CM | POA: Diagnosis not present

## 2017-09-29 DIAGNOSIS — K754 Autoimmune hepatitis: Secondary | ICD-10-CM | POA: Diagnosis not present

## 2017-10-10 ENCOUNTER — Ambulatory Visit (INDEPENDENT_AMBULATORY_CARE_PROVIDER_SITE_OTHER): Payer: Medicare Other | Admitting: Internal Medicine

## 2017-10-10 ENCOUNTER — Encounter: Payer: Self-pay | Admitting: Internal Medicine

## 2017-10-10 VITALS — BP 134/84 | HR 80 | Temp 97.8°F | Ht 73.0 in | Wt 269.0 lb

## 2017-10-10 DIAGNOSIS — N400 Enlarged prostate without lower urinary tract symptoms: Secondary | ICD-10-CM | POA: Diagnosis not present

## 2017-10-10 DIAGNOSIS — Z7189 Other specified counseling: Secondary | ICD-10-CM

## 2017-10-10 DIAGNOSIS — I10 Essential (primary) hypertension: Secondary | ICD-10-CM

## 2017-10-10 DIAGNOSIS — Z23 Encounter for immunization: Secondary | ICD-10-CM

## 2017-10-10 DIAGNOSIS — Z0000 Encounter for general adult medical examination without abnormal findings: Secondary | ICD-10-CM | POA: Diagnosis not present

## 2017-10-10 DIAGNOSIS — Z944 Liver transplant status: Secondary | ICD-10-CM | POA: Insufficient documentation

## 2017-10-10 DIAGNOSIS — N183 Chronic kidney disease, stage 3 unspecified: Secondary | ICD-10-CM

## 2017-10-10 NOTE — Assessment & Plan Note (Signed)
I have personally reviewed the Medicare Annual Wellness questionnaire and have noted 1. The patient's medical and social history 2. Their use of alcohol, tobacco or illicit drugs 3. Their current medications and supplements 4. The patient's functional ability including ADL's, fall risks, home safety risks and hearing or visual             impairment. 5. Diet and physical activities 6. Evidence for depression or mood disorders  The patients weight, height, BMI and visual acuity have been recorded in the chart I have made referrals, counseling and provided education to the patient based review of the above and I have provided the pt with a written personalized care plan for preventive services.  I have provided you with a copy of your personalized plan for preventive services. Please take the time to review along with your updated medication list.  Pneumovax booster today Yearly flu vaccine Due for colon next year Discussed PSA---will defer again Discussed fitness

## 2017-10-10 NOTE — Assessment & Plan Note (Signed)
Continues at [facility] Some confusion with cellcept dose--but now it is corrected

## 2017-10-10 NOTE — Assessment & Plan Note (Signed)
See social history 

## 2017-10-10 NOTE — Addendum Note (Signed)
Addended by: Elmon Kirschner A on: 10/10/2017 12:34 PM   Modules accepted: Orders

## 2017-10-10 NOTE — Assessment & Plan Note (Signed)
Mild  Is on ARB Labs reviewed from [facility]

## 2017-10-10 NOTE — Assessment & Plan Note (Signed)
Doing okay with the tamsulosin

## 2017-10-10 NOTE — Progress Notes (Signed)
Subjective:    Patient ID: Devin White, male    DOB: Nov 08, 1955, 62 y.o.   MRN: [id number]  HPI Here for Medicare wellness visit and follow up of chronic health conditions Reviewed form and advanced directives Reviewed other doctors No alcohol or tobacco Tries to walk sometimes--discussed increased frequency and resistance work No falls No depression or anhedonia Vision and hearing are fine Independent with instrumental ADLs No sig memory problems  Finally over the bronchitis  10 year check up for liver transplant recently Liver is doing okay Recent blood work okay--creatinine still mildly elevated (CKD 3) Had confusion with cellcept dosing--- levels were off some but now has it back on track  BP has been okay No chest pain or SOB No dizziness or syncope No palpitations No edema Energy levels are better  Had gout flare about a month ago Used a few pills--no problem since  Current Outpatient Medications on File Prior to Visit  Medication Sig Dispense Refill  . albuterol (PROVENTIL HFA;VENTOLIN HFA) 108 (90 Base) MCG/ACT inhaler Inhale 2 puffs into the lungs every 4 (four) hours as needed for wheezing. 1 Inhaler 0  . aspirin EC 81 MG tablet Take 81 mg by mouth daily.    . calcium carbonate (TUMS) 500 MG chewable tablet Chew 1 tablet (200 mg of elemental calcium total) by mouth 2 (two) times daily as needed for indigestion or heartburn. 180 tablet 1  . colchicine 0.6 MG tablet TAKE 1 TABLET BY MOUTH 2 (TWO) TIMES DAILY AS NEEDED (FOR GOUT). 30 tablet 0  . losartan (COZAAR) 50 MG tablet TAKE 1 TABLET BY MOUTH DAILY 90 tablet 3  . metoprolol succinate (TOPROL-XL) 50 MG 24 hr tablet TAKE 1 TABLET BY MOUTH DAILY 30 tablet 11  . mycophenolate (CELLCEPT) 250 MG capsule Take 500 mg by mouth daily.     . nitroGLYCERIN (NITROSTAT) 0.4 MG SL tablet Place 1 tablet (0.4 mg total) under the tongue every 5 (five) minutes as needed for chest pain. 25 tablet 6  . omeprazole (PRILOSEC)  20 MG capsule TAKE ONE (1) CAPSULE DAILY 1 capsule 0  . predniSONE (DELTASONE) 5 MG tablet Take 1 tablet (5 mg total) by mouth daily with breakfast.    . tacrolimus (PROGRAF) 1 MG capsule Take 1 mg by mouth 2 (two) times daily.     . tamsulosin (FLOMAX) 0.4 MG CAPS capsule TAKE ONE CAPSULE BY MOUTH DAILY 90 capsule 3   No current facility-administered medications on file prior to visit.     No Known Allergies  Past Medical History:  Diagnosis Date  . Benign prostatic hypertrophy   . GERD (gastroesophageal reflux disease)   . Gout   . History of blood transfusion    pt has antibodies in his blood since previous transfusions  . History of cirrhosis of liver S/P TRANSPLANT 2009  . History of liver failure S/P TRANSPLANT  . Hypertension   . Left ureteral calculus     Past Surgical History:  Procedure Laterality Date  . bone morrow biopsy    . CHOLECYSTECTOMY  2007  . CYSTO/ LEFT RETROGRADE PYELOGRAM/ LEFT URETERAL STENT PLACEMENT  03-05-2012  DR Tresa Moore [facility])   LEFT URETERAL CALCULI  . CYSTOSCOPY W/ URETERAL STENT PLACEMENT  03/11/2012   Procedure: CYSTOSCOPY WITH STENT REPLACEMENT;  Surgeon: Alexis Frock, MD;  Location: [facility];  Service: Urology;  Laterality: Left;  . HERNIA REPAIR  2008  . LIVER BIOPSY    . LIVER TRANSPLANT  11/24/2007   pt states doing well since liver transplant  . LUMBAR DISC SURGERY    . LUMBAR FUSION    . removal of fistula    . URETEROSCOPY  03/11/2012   Procedure: URETEROSCOPY;  Surgeon: Alexis Frock, MD;  Location: [facility];  Service: Urology;  Laterality: Left;   STONE MANIPULATION, stone obtained  [place] [facility]    Family History  Problem Relation Age of Onset  . Cancer Mother        colon  . Arthritis Mother   . Vasculitis Mother   . Hypertension Mother   . Cancer Father        colon  . Kidney disease Father   . Arthritis Father   . Arthritis Brother   . Alcohol abuse Maternal Aunt   . Diabetes  Maternal Aunt   . Alcohol abuse Maternal Uncle   . Diabetes Paternal Aunt   . Alcohol abuse Maternal Uncle   . Alcohol abuse Maternal Uncle     Social History   Socioeconomic History  . Marital status: Divorced    Spouse name: Not on file  . Number of children: 3  . Years of education: Not on file  . Highest education level: Not on file  Occupational History  . Occupation: disabled    Employer: RETIRED  Social Needs  . Financial resource strain: Not on file  . Food insecurity:    Worry: Not on file    Inability: Not on file  . Transportation needs:    Medical: Not on file    Non-medical: Not on file  Tobacco Use  . Smoking status: Never Smoker  . Smokeless tobacco: Never Used  Substance and Sexual Activity  . Alcohol use: No  . Drug use: No  . Sexual activity: Not on file  Lifestyle  . Physical activity:    Days per week: Not on file    Minutes per session: Not on file  . Stress: Not on file  Relationships  . Social connections:    Talks on phone: Not on file    Gets together: Not on file    Attends religious service: Not on file    Active member of club or organization: Not on file    Attends meetings of clubs or organizations: Not on file    Relationship status: Not on file  . Intimate partner violence:    Fear of current or ex partner: Not on file    Emotionally abused: Not on file    Physically abused: Not on file    Forced sexual activity: Not on file  Other Topics Concern  . Not on file  Social History Narrative   Has living will   Requests daughter Devin White as his health care POA   Would accept brief attempt at resuscitation but no prolonged ventilation   No tube feeds if cognitively unaware   Review of Systems Has not seen dentist in many years No skin problems---no dermatologist Appetite is good Working on lifestyle--- weight down 13# since last year Sleeps okay---no CPAP Some generalized arthritis--- not a big deal (hands mostly). Rare  aleve--suggested tylenol would be better Wears seat belt Bowels are fine. No blood Voids okay. Stream is good    Objective:   Physical Exam  Constitutional: He is oriented to person, place, and time. He appears well-developed. No distress.  HENT:  Mouth/Throat: Oropharynx is clear and moist. No oropharyngeal exudate.  Neck: No thyromegaly present.  Cardiovascular: Normal  rate, regular rhythm, normal heart sounds and intact distal pulses. Exam reveals no friction rub.  No murmur heard. Pulmonary/Chest: Effort normal and breath sounds normal. No stridor. No respiratory distress. He has no wheezes. He has no rales.  Abdominal: Soft. He exhibits no mass. There is no tenderness. There is no guarding.  Musculoskeletal: He exhibits no edema or tenderness.  Lymphadenopathy:    He has no cervical adenopathy.  Neurological: He is alert and oriented to person, place, and time.  President--- "Trump, Obama, Clinton----Bush" [phone] [id number] Recall 3/3  Skin: Skin is warm. No rash noted.  Psychiatric: He has a normal mood and affect. His behavior is normal.          Assessment & Plan:

## 2017-10-10 NOTE — Assessment & Plan Note (Signed)
BP Readings from Last 3 Encounters:  10/10/17 134/84  08/25/17 132/78  10/09/16 124/80   Good control

## 2017-10-10 NOTE — Progress Notes (Signed)
Hearing Screening   Method: Audiometry   125Hz  250Hz  500Hz  1000Hz  2000Hz  3000Hz  4000Hz  6000Hz  8000Hz   Right ear:   25 20 25   40    Left ear:   20 20 25   0      Visual Acuity Screening   Right eye Left eye Both eyes  Without correction: 20/25 20/50 20/25   With correction:

## 2017-11-20 DIAGNOSIS — Z944 Liver transplant status: Secondary | ICD-10-CM | POA: Diagnosis not present

## 2017-11-20 DIAGNOSIS — D899 Disorder involving the immune mechanism, unspecified: Secondary | ICD-10-CM | POA: Diagnosis not present

## 2018-01-12 ENCOUNTER — Other Ambulatory Visit: Payer: Self-pay | Admitting: Internal Medicine

## 2018-01-14 ENCOUNTER — Other Ambulatory Visit: Payer: Self-pay | Admitting: Internal Medicine

## 2018-02-26 ENCOUNTER — Other Ambulatory Visit: Payer: Self-pay | Admitting: Internal Medicine

## 2018-03-30 DIAGNOSIS — K754 Autoimmune hepatitis: Secondary | ICD-10-CM | POA: Diagnosis not present

## 2018-03-30 DIAGNOSIS — Z944 Liver transplant status: Secondary | ICD-10-CM | POA: Diagnosis not present

## 2018-03-30 DIAGNOSIS — T8643 Liver transplant infection: Secondary | ICD-10-CM | POA: Diagnosis not present

## 2018-03-30 DIAGNOSIS — D899 Disorder involving the immune mechanism, unspecified: Secondary | ICD-10-CM | POA: Diagnosis not present

## 2018-03-30 DIAGNOSIS — Z4823 Encounter for aftercare following liver transplant: Secondary | ICD-10-CM | POA: Diagnosis not present

## 2018-03-30 DIAGNOSIS — Z23 Encounter for immunization: Secondary | ICD-10-CM | POA: Diagnosis not present

## 2018-03-30 DIAGNOSIS — I10 Essential (primary) hypertension: Secondary | ICD-10-CM | POA: Diagnosis not present

## 2018-08-04 ENCOUNTER — Inpatient Hospital Stay (HOSPITAL_COMMUNITY)
Admission: EM | Admit: 2018-08-04 | Discharge: 2018-08-08 | DRG: 690 | Disposition: A | Discharge status: Home / Self Care | Payer: Medicare Other | Attending: Internal Medicine | Admitting: Internal Medicine

## 2018-08-04 ENCOUNTER — Emergency Department (HOSPITAL_COMMUNITY): Payer: Medicare Other

## 2018-08-04 ENCOUNTER — Encounter (HOSPITAL_COMMUNITY): Payer: Self-pay

## 2018-08-04 ENCOUNTER — Other Ambulatory Visit: Payer: Self-pay

## 2018-08-04 DIAGNOSIS — M109 Gout, unspecified: Secondary | ICD-10-CM | POA: Diagnosis not present

## 2018-08-04 DIAGNOSIS — R911 Solitary pulmonary nodule: Secondary | ICD-10-CM | POA: Diagnosis not present

## 2018-08-04 DIAGNOSIS — M545 Low back pain, unspecified: Secondary | ICD-10-CM

## 2018-08-04 DIAGNOSIS — M5489 Other dorsalgia: Secondary | ICD-10-CM | POA: Diagnosis not present

## 2018-08-04 DIAGNOSIS — K219 Gastro-esophageal reflux disease without esophagitis: Secondary | ICD-10-CM | POA: Diagnosis not present

## 2018-08-04 DIAGNOSIS — IMO0001 Reserved for inherently not codable concepts without codable children: Secondary | ICD-10-CM

## 2018-08-04 DIAGNOSIS — R51 Headache: Secondary | ICD-10-CM | POA: Diagnosis not present

## 2018-08-04 DIAGNOSIS — Z841 Family history of disorders of kidney and ureter: Secondary | ICD-10-CM

## 2018-08-04 DIAGNOSIS — I129 Hypertensive chronic kidney disease with stage 1 through stage 4 chronic kidney disease, or unspecified chronic kidney disease: Secondary | ICD-10-CM | POA: Diagnosis not present

## 2018-08-04 DIAGNOSIS — N1832 Chronic kidney disease, stage 3b: Secondary | ICD-10-CM | POA: Diagnosis present

## 2018-08-04 DIAGNOSIS — N183 Chronic kidney disease, stage 3 (moderate): Secondary | ICD-10-CM

## 2018-08-04 DIAGNOSIS — N200 Calculus of kidney: Secondary | ICD-10-CM | POA: Diagnosis not present

## 2018-08-04 DIAGNOSIS — Z7952 Long term (current) use of systemic steroids: Secondary | ICD-10-CM

## 2018-08-04 DIAGNOSIS — N12 Tubulo-interstitial nephritis, not specified as acute or chronic: Secondary | ICD-10-CM | POA: Diagnosis not present

## 2018-08-04 DIAGNOSIS — I959 Hypotension, unspecified: Secondary | ICD-10-CM | POA: Diagnosis not present

## 2018-08-04 DIAGNOSIS — N10 Acute tubulo-interstitial nephritis: Secondary | ICD-10-CM | POA: Diagnosis not present

## 2018-08-04 DIAGNOSIS — N390 Urinary tract infection, site not specified: Secondary | ICD-10-CM | POA: Diagnosis not present

## 2018-08-04 DIAGNOSIS — R0902 Hypoxemia: Secondary | ICD-10-CM | POA: Diagnosis not present

## 2018-08-04 DIAGNOSIS — B9620 Unspecified Escherichia coli [E. coli] as the cause of diseases classified elsewhere: Secondary | ICD-10-CM | POA: Diagnosis not present

## 2018-08-04 DIAGNOSIS — Z944 Liver transplant status: Secondary | ICD-10-CM | POA: Diagnosis not present

## 2018-08-04 DIAGNOSIS — Z79899 Other long term (current) drug therapy: Secondary | ICD-10-CM | POA: Diagnosis not present

## 2018-08-04 DIAGNOSIS — Z7960 Long term (current) use of unspecified immunomodulators and immunosuppressants: Secondary | ICD-10-CM

## 2018-08-04 DIAGNOSIS — Q799 Congenital malformation of musculoskeletal system, unspecified: Secondary | ICD-10-CM

## 2018-08-04 DIAGNOSIS — R400 Somnolence: Secondary | ICD-10-CM | POA: Diagnosis not present

## 2018-08-04 DIAGNOSIS — N401 Enlarged prostate with lower urinary tract symptoms: Secondary | ICD-10-CM | POA: Diagnosis present

## 2018-08-04 DIAGNOSIS — R110 Nausea: Secondary | ICD-10-CM | POA: Diagnosis not present

## 2018-08-04 DIAGNOSIS — M549 Dorsalgia, unspecified: Secondary | ICD-10-CM | POA: Diagnosis not present

## 2018-08-04 LAB — URINALYSIS, ROUTINE W REFLEX MICROSCOPIC
BILIRUBIN URINE: NEGATIVE
Glucose, UA: NEGATIVE mg/dL
Ketones, ur: NEGATIVE mg/dL
Nitrite: POSITIVE — AB
PH: 5 (ref 5.0–8.0)
Protein, ur: 100 mg/dL — AB
SPECIFIC GRAVITY, URINE: 1.021 (ref 1.005–1.030)
WBC, UA: >50 WBC/hpf — ABNORMAL HIGH (ref 0–5)

## 2018-08-04 LAB — CBC WITH DIFFERENTIAL/PLATELET
Abs Immature Granulocytes: 0.27 10*3/uL — ABNORMAL HIGH (ref 0.00–0.07)
Basophils Absolute: 0 10*3/uL (ref 0.0–0.1)
Basophils Relative: 0 %
EOS PCT: 0 %
Eosinophils Absolute: 0 10*3/uL (ref 0.0–0.5)
HCT: 44.3 % (ref 39.0–52.0)
Hemoglobin: 14.8 g/dL (ref 13.0–17.0)
Immature Granulocytes: 2 %
LYMPHS PCT: 3 %
Lymphs Abs: 0.5 10*3/uL — ABNORMAL LOW (ref 0.7–4.0)
MCH: 32.7 pg (ref 26.0–34.0)
MCHC: 33.4 g/dL (ref 30.0–36.0)
MCV: 97.8 fL (ref 80.0–100.0)
Monocytes Absolute: 2.1 10*3/uL — ABNORMAL HIGH (ref 0.1–1.0)
Monocytes Relative: 12 %
Neutro Abs: 15.6 10*3/uL — ABNORMAL HIGH (ref 1.7–7.7)
Neutrophils Relative %: 83 %
Platelets: 147 10*3/uL — ABNORMAL LOW (ref 150–400)
RBC: 4.53 MIL/uL (ref 4.22–5.81)
RDW: 13.6 % (ref 11.5–15.5)
WBC: 18.6 10*3/uL — ABNORMAL HIGH (ref 4.0–10.5)
nRBC: 0 % (ref 0.0–0.2)

## 2018-08-04 LAB — COMPREHENSIVE METABOLIC PANEL
ALBUMIN: 3.6 g/dL (ref 3.5–5.0)
ALT: 31 U/L (ref 0–44)
AST: 21 U/L (ref 15–41)
Alkaline Phosphatase: 44 U/L (ref 38–126)
Anion gap: 9 (ref 5–15)
BUN: 18 mg/dL (ref 6–20)
CO2: 22 mmol/L (ref 22–32)
CREATININE: 1.71 mg/dL — AB (ref 0.61–1.24)
Calcium: 8.4 mg/dL — ABNORMAL LOW (ref 8.9–10.3)
Chloride: 108 mmol/L (ref 98–111)
GFR calc Af Amer: 49 mL/min — ABNORMAL LOW (ref >60)
GFR calc non Af Amer: 43 mL/min — ABNORMAL LOW (ref >60)
Glucose, Bld: 114 mg/dL — ABNORMAL HIGH (ref 70–99)
Potassium: 3 mmol/L — ABNORMAL LOW (ref 3.5–5.1)
Sodium: 139 mmol/L (ref 135–145)
Total Bilirubin: 2.7 mg/dL — ABNORMAL HIGH (ref 0.3–1.2)
Total Protein: 6.3 g/dL — ABNORMAL LOW (ref 6.5–8.1)

## 2018-08-04 MED ORDER — SODIUM CHLORIDE 0.9 % IV SOLN: 1.0000 g | INTRAVENOUS | Status: DC

## 2018-08-04 MED ORDER — FENTANYL CITRATE (PF) 100 MCG/2ML IJ SOLN
50.0000 ug | Freq: Once | INTRAMUSCULAR | Status: AC | PRN
  Administered 2018-08-04 (×2): 50 ug via INTRAVENOUS
  Filled 2018-08-04: qty 2

## 2018-08-04 MED ORDER — COLCHICINE 0.6 MG PO TABS
0.6000 mg | ORAL_TABLET | Freq: Every day | ORAL | Status: DC | PRN
  Filled 2018-08-04: qty 1

## 2018-08-04 MED ORDER — PANTOPRAZOLE SODIUM 40 MG PO TBEC
40.0000 mg | DELAYED_RELEASE_TABLET | Freq: Two times a day (BID) | ORAL | Status: DC
  Administered 2018-08-04 – 2018-08-08 (×8): 40 mg via ORAL
  Filled 2018-08-04 (×8): qty 1

## 2018-08-04 MED ORDER — ACETAMINOPHEN 325 MG PO TABS
650.0000 mg | ORAL_TABLET | Freq: Once | ORAL | Status: AC
  Administered 2018-08-04: 650 mg via ORAL
  Filled 2018-08-04: qty 2

## 2018-08-04 MED ORDER — ONDANSETRON HCL 4 MG/2ML IJ SOLN: 4.0000 mg | Freq: Four times a day (QID) | INTRAMUSCULAR | Status: DC | PRN

## 2018-08-04 MED ORDER — SODIUM CHLORIDE 0.9 % IV SOLN
1.0000 g | INTRAVENOUS | Status: DC
  Administered 2018-08-05 – 2018-08-07 (×3): 1 g via INTRAVENOUS
  Filled 2018-08-04 (×3): qty 1

## 2018-08-04 MED ORDER — TACROLIMUS 1 MG PO CAPS
1.0000 mg | ORAL_CAPSULE | Freq: Every day | ORAL | Status: DC
  Administered 2018-08-04 – 2018-08-07 (×4): 1 mg via ORAL
  Filled 2018-08-04 (×4): qty 1

## 2018-08-04 MED ORDER — TAMSULOSIN HCL 0.4 MG PO CAPS
0.4000 mg | ORAL_CAPSULE | Freq: Every day | ORAL | Status: DC
  Administered 2018-08-05 – 2018-08-08 (×4): 0.4 mg via ORAL
  Filled 2018-08-04 (×4): qty 1

## 2018-08-04 MED ORDER — FENTANYL CITRATE (PF) 100 MCG/2ML IJ SOLN
25.0000 ug | INTRAMUSCULAR | Status: DC | PRN
  Administered 2018-08-05 – 2018-08-07 (×10): 50 ug via INTRAVENOUS
  Filled 2018-08-04 (×10): qty 2

## 2018-08-04 MED ORDER — CALCIUM CARBONATE ANTACID 500 MG PO CHEW: 1.0000 | CHEWABLE_TABLET | Freq: Two times a day (BID) | ORAL | Status: DC | PRN

## 2018-08-04 MED ORDER — SODIUM CHLORIDE 0.9 % IV SOLN
1.0000 g | Freq: Once | INTRAVENOUS | Status: AC
  Administered 2018-08-04: 1 g via INTRAVENOUS
  Filled 2018-08-04: qty 10

## 2018-08-04 MED ORDER — TACROLIMUS 1 MG PO CAPS
2.0000 mg | ORAL_CAPSULE | Freq: Every day | ORAL | Status: DC
  Administered 2018-08-05 – 2018-08-08 (×4): 2 mg via ORAL
  Filled 2018-08-04 (×4): qty 2

## 2018-08-04 MED ORDER — PREDNISONE 5 MG PO TABS
15.0000 mg | ORAL_TABLET | Freq: Every day | ORAL | Status: DC
  Administered 2018-08-04 – 2018-08-08 (×4): 15 mg via ORAL
  Filled 2018-08-04 (×6): qty 1

## 2018-08-04 MED ORDER — ENOXAPARIN SODIUM 40 MG/0.4ML ~~LOC~~ SOLN
40.0000 mg | Freq: Every day | SUBCUTANEOUS | Status: DC
  Administered 2018-08-05 – 2018-08-08 (×4): 40 mg via SUBCUTANEOUS
  Filled 2018-08-04 (×4): qty 0.4

## 2018-08-04 MED ORDER — ONDANSETRON HCL 4 MG PO TABS: 4.0000 mg | ORAL_TABLET | Freq: Four times a day (QID) | ORAL | Status: DC | PRN

## 2018-08-04 MED ORDER — FENTANYL CITRATE (PF) 100 MCG/2ML IJ SOLN
50.0000 ug | Freq: Once | INTRAMUSCULAR | Status: DC | PRN
  Filled 2018-08-04: qty 2

## 2018-08-04 MED ORDER — POTASSIUM CHLORIDE CRYS ER 20 MEQ PO TBCR
40.0000 meq | EXTENDED_RELEASE_TABLET | Freq: Once | ORAL | Status: AC
  Administered 2018-08-04: 40 meq via ORAL
  Filled 2018-08-04: qty 2

## 2018-08-04 MED ORDER — LACTATED RINGERS IV BOLUS
1000.0000 mL | Freq: Once | INTRAVENOUS | Status: AC
  Administered 2018-08-04: 1000 mL via INTRAVENOUS

## 2018-08-04 MED ORDER — TACROLIMUS 1 MG PO CAPS: 1.0000 mg | ORAL_CAPSULE | ORAL | Status: DC

## 2018-08-04 MED ORDER — MYCOPHENOLATE MOFETIL 250 MG PO CAPS
500.0000 mg | ORAL_CAPSULE | Freq: Two times a day (BID) | ORAL | Status: DC
  Administered 2018-08-04 – 2018-08-08 (×8): 500 mg via ORAL
  Filled 2018-08-04 (×8): qty 2

## 2018-08-04 MED ORDER — GADOBUTROL 1 MMOL/ML IV SOLN
10.0000 mL | Freq: Once | INTRAVENOUS | Status: AC | PRN
  Administered 2018-08-04: 10 mL via INTRAVENOUS

## 2018-08-04 MED ORDER — SODIUM CHLORIDE 0.9 % IV BOLUS
1000.0000 mL | Freq: Once | INTRAVENOUS | Status: AC
  Administered 2018-08-04: 1000 mL via INTRAVENOUS

## 2018-08-04 MED ORDER — PREDNISONE 5 MG PO TABS: 5.0000 mg | ORAL_TABLET | Freq: Every day | ORAL | Status: DC

## 2018-08-04 NOTE — ED Notes (Signed)
Provider in room  

## 2018-08-04 NOTE — ED Provider Notes (Signed)
Signout from Dr. Regenia Skeeter.  63 year old male here with acute onset of urinary symptoms and right lower back pain with some radiation down his leg.  He has a positive UA and was treated with a gram of Rocephin.  His CT stone study was negative.  He is pending an MRI of his lumbar spine with and without contrast.  Plan is if no acute findings on MRI to be discharged on Keflex.  Physical Exam  BP 119/74   Pulse 90   Temp 99.3 F (37.4 C) (Rectal)   Resp 20   Ht 6\' 1"  (1.854 m)   Wt 117.9 kg   SpO2 95%   BMI 34.30 kg/m   Physical Exam  ED Course/Procedures   Clinical Course as of Aug 04 2330  Tue Aug 04, 2018  1814 Patient's MRI is complete and does not look like it shows any significant acute findings.  Will review with him and see if he is appropriate for discharge.   [MB]  6759 Patient states he still feels dizzy.  He has not eaten all day so we will try him on something to eat.   [MB]  2013 Patient try to eat something and he still does not feel well.  I think ultimately he may need to be admitted to the hospital for continued management of symptoms until he is more improved.   [MB]  2109 Discussed with hospitalist Dr. Alcario Drought who will admit the patient to his service.   [MB]    Clinical Course User Index [MB] Hayden Rasmussen, MD    Procedures        Hayden Rasmussen, MD 08/04/18 [phone]

## 2018-08-04 NOTE — ED Notes (Signed)
ED TO INPATIENT HANDOFF REPORT  Name/Age/Gender Devin White 63 y.o. male  Code Status    Code Status Orders  (From admission, onward)         Start     Ordered   08/04/18 2205  Full code  Continuous     08/04/18 2208        Code Status History    This patient has a current code status but no historical code status.      Home/SNF/Other Home  Chief Complaint Right flank pain   Level of Care/Admitting Diagnosis ED Disposition    ED Disposition Condition Comment   Admit  Hospital Area: [facility] [[id number]]  Level of Care: Med-Surg [16]  Diagnosis: UTI (urinary tract infection) [045409]  Admitting Physician: Etta Quill [phone]  Attending Physician: Etta Quill [[id number]]  PT Class (Do Not Modify): Observation [104]  PT Acc Code (Do Not Modify): Observation [10022]       Medical History Past Medical History:  Diagnosis Date  . Benign prostatic hypertrophy   . GERD (gastroesophageal reflux disease)   . Gout   . History of blood transfusion    pt has antibodies in his blood since previous transfusions  . History of cirrhosis of liver S/P TRANSPLANT 2009  . History of liver failure S/P TRANSPLANT  . Hypertension   . Left ureteral calculus     Allergies No Known Allergies  IV Location/Drains/Wounds Patient Lines/Drains/Airways Status   Active Line/Drains/Airways    Name:   Placement date:   Placement time:   Site:   Days:   Peripheral IV 04/04/16 Left Antecubital   04/04/16    1809    Antecubital   852   Peripheral IV 08/04/18 Left Hand   08/04/18    -    Hand   less than 1   Ureteral Drain/Stent Left ureter 6 Fr.   03/11/12    0853    Left ureter   2337   Incision 03/05/12 Perineum   03/05/12    1742     2343   Incision 03/11/12 Perineum Left   03/11/12    0843     2337          Labs/Imaging Results for orders placed or performed during the hospital encounter of 08/04/18 (from the past 48 hour(s))  Comprehensive  metabolic panel     Status: Abnormal   Collection Time: 08/04/18 11:30 AM  Result Value Ref Range   Sodium 139 135 - 145 mmol/L   Potassium 3.0 (L) 3.5 - 5.1 mmol/L   Chloride 108 98 - 111 mmol/L   CO2 22 22 - 32 mmol/L   Glucose, Bld 114 (H) 70 - 99 mg/dL   BUN 18 6 - 20 mg/dL   Creatinine, Ser 1.71 (H) 0.61 - 1.24 mg/dL   Calcium 8.4 (L) 8.9 - 10.3 mg/dL   Total Protein 6.3 (L) 6.5 - 8.1 g/dL   Albumin 3.6 3.5 - 5.0 g/dL   AST 21 15 - 41 U/L   ALT 31 0 - 44 U/L   Alkaline Phosphatase 44 38 - 126 U/L   Total Bilirubin 2.7 (H) 0.3 - 1.2 mg/dL   GFR calc non Af Amer 43 (L) >60 mL/min   GFR calc Af Amer 49 (L) >60 mL/min   Anion gap 9 5 - 15    Comment: Performed at [facility], [place] [address] [place],  [address]  CBC with Differential  Status: Abnormal   Collection Time: 08/04/18 11:30 AM  Result Value Ref Range   WBC 18.6 (H) 4.0 - 10.5 K/uL   RBC 4.53 4.22 - 5.81 MIL/uL   Hemoglobin 14.8 13.0 - 17.0 g/dL   HCT 44.3 39.0 - 52.0 %   MCV 97.8 80.0 - 100.0 fL   MCH 32.7 26.0 - 34.0 pg   MCHC 33.4 30.0 - 36.0 g/dL   RDW 13.6 11.5 - 15.5 %   Platelets 147 (L) 150 - 400 K/uL   nRBC 0.0 0.0 - 0.2 %   Neutrophils Relative % 83 %   Neutro Abs 15.6 (H) 1.7 - 7.7 K/uL   Lymphocytes Relative 3 %   Lymphs Abs 0.5 (L) 0.7 - 4.0 K/uL   Monocytes Relative 12 %   Monocytes Absolute 2.1 (H) 0.1 - 1.0 K/uL   Eosinophils Relative 0 %   Eosinophils Absolute 0.0 0.0 - 0.5 K/uL   Basophils Relative 0 %   Basophils Absolute 0.0 0.0 - 0.1 K/uL   Immature Granulocytes 2 %   Abs Immature Granulocytes 0.27 (H) 0.00 - 0.07 K/uL    Comment: Performed at [facility], [facility], [place] [address] [place], [place] [address]  Urinalysis, Routine w reflex microscopic     Status: Abnormal   Collection Time: 08/04/18 12:31 PM  Result Value Ref Range   Color, Urine AMBER (A) YELLOW    Comment: BIOCHEMICALS MAY BE AFFECTED BY COLOR   APPearance CLOUDY (A) CLEAR    Specific Gravity, Urine 1.021 1.005 - 1.030   pH 5.0 5.0 - 8.0   Glucose, UA NEGATIVE NEGATIVE mg/dL   Hgb urine dipstick MODERATE (A) NEGATIVE   Bilirubin Urine NEGATIVE NEGATIVE   Ketones, ur NEGATIVE NEGATIVE mg/dL   Protein, ur 100 (A) NEGATIVE mg/dL   Nitrite POSITIVE (A) NEGATIVE   Leukocytes,Ua MODERATE (A) NEGATIVE   RBC / HPF >50 (H) 0 - 5 RBC/hpf   WBC, UA >50 (H) 0 - 5 WBC/hpf   Bacteria, UA FEW (A) NONE SEEN   Squamous Epithelial / LPF 0-5 0 - 5   WBC Clumps PRESENT    Mucus PRESENT     Comment: Performed at [facility], [place] [address], [place], [place] [address]   Mr Lumbar Spine W Wo Contrast  Result Date: 08/04/2018 CLINICAL DATA:  63 y/o M; back pain, cauda equina syndrome suspected. EXAM: MRI LUMBAR SPINE WITHOUT AND WITH CONTRAST TECHNIQUE: Multiplanar and multiecho pulse sequences of the lumbar spine were obtained without and with intravenous contrast. CONTRAST:  10 cc Gadavist COMPARISON:  08/04/2018 CT abdomen and pelvis. 10/11/2008 MRI of the lumbar spine. FINDINGS: Segmentation:  Standard. Alignment:  Physiologic. Vertebrae: L4-S1 PLIF. Susceptibility artifact from the fusion hardware partially obscures the vertebral bodies, spinal canal, and neural foramen throughout the levels of fusion. No findings of fracture or discitis. No suspicious bone lesion. No abnormal enhancement. Conus medullaris and cauda equina: Conus extends to the L1 level. Conus and cauda equina appear normal. No abnormal enhancement. Paraspinal and other soft tissues: Chronic for surgical changes within the subcutaneous fat and paraspinal muscles the levels of lumbar fusion. Partially visualized left kidney interpolar cyst. Disc levels: L1-2: No significant disc displacement, foraminal stenosis, or canal stenosis. L2-3: Mild disc bulge. No significant foraminal or spinal canal stenosis. L3-4: Mild disc bulge and facet hypertrophy. No significant foraminal or spinal canal stenosis.  L4-5: The level is partially obscured by susceptibility artifact from fusion hardware. No spinal canal stenosis.  Neural foramen appear patent. L5-S1: The level is partially obscured by susceptibility artifact from fusion hardware. No spinal canal stenosis. Neural foramen appear patent. IMPRESSION: 1. L4-S1 PLIF chronic postsurgical changes. No acute osseous abnormality or malalignment. 2. Mild lumbar spondylosis. No significant foraminal or spinal canal stenosis. No findings of cauda equina syndrome. Electronically Signed   By: Kristine Garbe M.D.   On: 08/04/2018 18:06   Ct Renal Stone Study  Result Date: 08/04/2018 CLINICAL DATA:  63 year old with right flank pain. Stone disease suspected. EXAM: CT ABDOMEN AND PELVIS WITHOUT CONTRAST TECHNIQUE: Multidetector CT imaging of the abdomen and pelvis was performed following the standard protocol without IV contrast. COMPARISON:  10/16/2012 FINDINGS: Lower chest: 4 mm nodule along the right major fissure on sequence 6, image 3. Atelectasis at both lung bases. No large pleural effusions. Hepatobiliary: Gallbladder has been removed. No acute abnormality to the liver. Pancreas: Unremarkable. No pancreatic ductal dilatation or surrounding inflammatory changes. Spleen: Normal in size without focal abnormality. Adrenals/Urinary Tract: Stable low-density nodule involving the right adrenal gland that measures roughly 1.1 cm. Findings are suggestive for an adenoma. Question a small myelolipoma in the right adrenal gland. Left adrenal gland is unremarkable. Punctate calcification in the right kidney upper pole. 2 mm stone in the anterior mid right kidney. 2.5 cm low-density structure in medial right kidney is suggestive for a cyst. Negative for right hydronephrosis. Urinary bladder is decompressed. 4 mm stone in the mid left kidney. Probable cyst in the medial left kidney. Negative for left hydronephrosis. Question a small cyst in left kidney lower pole. No ureter  stones. Stomach/Bowel: Stomach is within normal limits. Appendix appears normal. No evidence of bowel wall thickening, distention, or inflammatory changes. Vascular/Lymphatic: Atherosclerotic calcifications in the aorta and iliac arteries without aneurysm. No lymph node enlargement in the abdomen or pelvis. Reproductive: Seminal vesicles are mildly prominent but stable. Prostate is unremarkable. Other: Negative for free fluid. Small umbilical hernia containing fat. Negative for free air. Musculoskeletal: Pedicle screw and rod fixation at L4, L5 and S1. IMPRESSION: 1. Small bilateral kidney stones.  Negative for hydronephrosis. 2. Probable bilateral renal cysts. 3. Atelectasis at the lung bases. Indeterminate 4 mm nodule near the right major fissure. No follow-up needed if patient is low-risk. Non-contrast chest CT can be considered in 12 months if patient is high-risk. This recommendation follows the consensus statement: Guidelines for Management of Incidental Pulmonary Nodules Detected on CT Images: From the Fleischner Society 2017; Radiology 2017; 284:228-243. 4.  Aortic Atherosclerosis (ICD10-I70.0). Electronically Signed   By: Markus Daft M.D.   On: 08/04/2018 12:39   EKG Interpretation  Date/Time:  Tuesday August 04 2018 14:25:21 EST Ventricular Rate:  90 PR Interval:    QRS Duration: 83 QT Interval:  348 QTC Calculation: 426 R Axis:   -2 Text Interpretation:  Sinus rhythm Abnormal R-wave progression, early transition Left ventricular hypertrophy Borderline T abnormalities, inferior leads Baseline wander in lead(s) V1 Confirmed by Sherwood Gambler [phone]) on 08/04/2018 3:37:18 PM   Pending Labs Unresulted Labs (From admission, onward)    Start     Ordered   08/05/18 0500  CBC  Tomorrow morning,   R     08/04/18 2208   08/05/18 2633  Basic metabolic panel  Tomorrow morning,   R     08/04/18 2208   08/04/18 2202  HIV antibody (Routine Testing)  Once,   R     08/04/18 2208   08/04/18 1120   Urine culture  ONCE -  STAT,   STAT     08/04/18 1120          Vitals/Pain Today's Vitals   08/04/18 1630 08/04/18 1659 08/04/18 1922 08/04/18 2148  BP: 127/83  (!) 146/98 (!) 154/86  Pulse: 89  92 (!) 102  Resp: 16  17 17   Temp:   98.8 F (37.1 C) 98.9 F (37.2 C)  TempSrc:   Oral Oral  SpO2: 96%  98% 94%  Weight:      Height:      PainSc:  5       Isolation Precautions No active isolations  Medications Medications  mycophenolate (CELLCEPT) capsule 500 mg (has no administration in time range)  calcium carbonate (TUMS - dosed in mg elemental calcium) chewable tablet 200 mg of elemental calcium (has no administration in time range)  colchicine tablet 0.6 mg (has no administration in time range)  tamsulosin (FLOMAX) capsule 0.4 mg (has no administration in time range)  pantoprazole (PROTONIX) EC tablet 40 mg (has no administration in time range)  tacrolimus (PROGRAF) capsule 2 mg (has no administration in time range)    And  tacrolimus (PROGRAF) capsule 1 mg (has no administration in time range)  predniSONE (DELTASONE) tablet 15 mg (has no administration in time range)  ondansetron (ZOFRAN) tablet 4 mg (has no administration in time range)    Or  ondansetron (ZOFRAN) injection 4 mg (has no administration in time range)  enoxaparin (LOVENOX) injection 40 mg (has no administration in time range)  cefTRIAXone (ROCEPHIN) 1 g in sodium chloride 0.9 % 100 mL IVPB (has no administration in time range)  fentaNYL (SUBLIMAZE) injection 25-50 mcg (has no administration in time range)  sodium chloride 0.9 % bolus 1,000 mL (0 mLs Intravenous Stopped 08/04/18 1241)  fentaNYL (SUBLIMAZE) injection 50 mcg (50 mcg Intravenous Given 08/04/18 2153)  potassium chloride SA (K-DUR,KLOR-CON) CR tablet 40 mEq (40 mEq Oral Given 08/04/18 1259)  cefTRIAXone (ROCEPHIN) 1 g in sodium chloride 0.9 % 100 mL IVPB ( Intravenous Stopped 08/04/18 1335)  acetaminophen (TYLENOL) tablet 650 mg (650 mg Oral Given  08/04/18 1447)  lactated ringers bolus 1,000 mL (0 mLs Intravenous Stopped 08/04/18 1547)  gadobutrol (GADAVIST) 1 MMOL/ML injection 10 mL (10 mLs Intravenous Contrast Given 08/04/18 1745)    Mobility walks with device

## 2018-08-04 NOTE — Discharge Instructions (Addendum)
Urinary Tract Infection, Adult A urinary tract infection (UTI) is an infection of any part of the urinary tract. The urinary tract includes:  The kidneys.  The ureters.  The bladder.  The urethra. These organs make, store, and get rid of pee (urine) in the body. What are the causes? This is caused by germs (bacteria) in your genital area. These germs grow and cause swelling (inflammation) of your urinary tract. What increases the risk? You are more likely to develop this condition if:  You have a small, thin tube (catheter) to drain pee.  You cannot control when you pee or poop (incontinence).  You are male, and: ? You use these methods to prevent pregnancy: ? A medicine that kills sperm (spermicide). ? A device that blocks sperm (diaphragm). ? You have low levels of a male hormone (estrogen). ? You are pregnant.  You have genes that add to your risk.  You are sexually active.  You take antibiotic medicines.  You have trouble peeing because of: ? A prostate that is bigger than normal, if you are male. ? A blockage in the part of your body that drains pee from the bladder (urethra). ? A kidney stone. ? A nerve condition that affects your bladder (neurogenic bladder). ? Not getting enough to drink. ? Not peeing often enough.  You have other conditions, such as: ? Diabetes. ? A weak disease-fighting system (immune system). ? Sickle cell disease. ? Gout. ? Injury of the spine. What are the signs or symptoms? Symptoms of this condition include:  Needing to pee right away (urgently).  Peeing often.  Peeing small amounts often.  Pain or burning when peeing.  Blood in the pee.  Pee that smells bad or not like normal.  Trouble peeing.  Pee that is cloudy.  Fluid coming from the vagina, if you are male.  Pain in the belly or lower back. Other symptoms include:  Throwing up (vomiting).  No urge to eat.  Feeling mixed up (confused).  Being  tired and grouchy (irritable).  A fever.  Watery poop (diarrhea). How is this treated? This condition may be treated with:  Antibiotic medicine.  Other medicines.  Drinking enough water. Follow these instructions at home:  Medicines  Take over-the-counter and prescription medicines only as told by your doctor.  If you were prescribed an antibiotic medicine, take it as told by your doctor. Do not stop taking it even if you start to feel better. General instructions  Make sure you: ? Pee until your bladder is empty. ? Do not hold pee for a long time. ? Empty your bladder after sex. ? Wipe from front to back after pooping if you are a male. Use each tissue one time when you wipe.  Drink enough fluid to keep your pee pale yellow.  Keep all follow-up visits as told by your doctor. This is important. Contact a doctor if:  You do not get better after 1-2 days.  Your symptoms go away and then come back. Get help right away if:  You have very bad back pain.  You have very bad pain in your lower belly.  You have a fever.  You are sick to your stomach (nauseous).  You are throwing up. Summary  A urinary tract infection (UTI) is an infection of any part of the urinary tract.  This condition is caused by germs in your genital area.  There are many risk factors for a UTI. These include having a small,  thin tube to drain pee and not being able to control when you pee or poop.  Treatment includes antibiotic medicines for germs.  Drink enough fluid to keep your pee pale yellow. This information is not intended to replace advice given to you by your health care provider. Make sure you discuss any questions you have with your health care provider. Document Released: 11/20/2007 Document Revised: 12/11/2017 Document Reviewed: 12/11/2017 Elsevier Interactive Patient Education  2019 [employer] Inc[employer]   Pulmonary Nodule A pulmonary nodule is tissue that has grown on your lung.  A nodule may be cancer, but most nodules are not cancer. Follow these instructions at home:   Take over-the-counter and prescription medicines only as told by your doctor.  Do not use any products that have nicotine or tobacco, such as cigarettes and e-cigarettes. If you need help quitting, ask your doctor.  Keep all follow-up visits as told by your doctor. This is important. Contact a doctor if:  You have trouble breathing when doing activities.  You feel sick.  You feel more tired than normal.  You do not feel like eating.  You lose weight without trying.  You have chills.  You have night sweats. Get help right away if:  You cannot catch your breath.  You start making whistling sounds when breathing (wheezing).  You cannot stop coughing.  You cough up blood.  You get dizzy.  You feel like you are going to pass out (faint).  You have sudden chest pain.  You have a fever or symptoms for more than 2-3 days.  You have a fever and your symptoms suddenly get worse. Summary  A pulmonary nodule is tissue that has grown on your lung.  Most nodules are not cancer.  Your doctor will do tests to know what kind of nodule you have, and whether you need treatment for it. This information is not intended to replace advice given to you by your health care provider. Make sure you discuss any questions you have with your health care provider. Document Released: 07/06/2010 Document Revised: 07/02/2016 Document Reviewed: 07/02/2016 Elsevier Interactive Patient Education  2019 Else[employer] Inc.   Pyelonephritis, Adult  Pyelonephritis is a kidney infection. The kidneys are organs that help clean your blood by moving waste out of your blood and into your pee (urine). This infection can happen quickly, or it can last for a long time. In most cases, it clears up with treatment and does not cause other problems. Follow these instructions at home: Medicines  Take over-the-counter and  prescription medicines only as told by your doctor.  Take your antibiotic medicine as told by your doctor. Do not stop taking the medicine even if you start to feel better. General instructions  Drink enough fluid to keep your pee clear or pale yellow.  Avoid caffeine, tea, and carbonated drinks.  Pee (urinate) often. Avoid holding in pee for long periods of time.  Pee before and after sex.  After pooping (having a bowel movement), women should wipe from front to back. Use each tissue only once.  Keep all follow-up visits as told by your doctor. This is important. Contact a doctor if:  You do not feel better after 2 days.  Your symptoms get worse.  You have a fever. Get help right away if:  You cannot take your medicine or drink fluids as told.  You have chills and shaking.  You throw up (vomit).  You have very bad pain in your side (flank) or back.  You feel very weak or you pass out (faint). This information is not intended to replace advice given to you by your health care provider. Make sure you discuss any questions you have with your health care provider. Document Released: 07/11/2004 Document Revised: 11/09/2015 Document Reviewed: 09/26/2014 Elsevier Interactive Patient Education  2019 [employer].   There is a lung nodule seen on your CT scan.  You will need to follow-up with your primary care physician for a repeat CT scan in about 12 months.

## 2018-08-04 NOTE — ED Provider Notes (Signed)
[facility] DEPT Provider Note   CSN: [id number] Arrival date & time: 08/04/18  1000    History   Chief Complaint Chief Complaint  Patient presents with  . Flank Pain    HPI Devin White is a 63 y.o. male.     HPI 63 year old male presents with difficulty urinating.  Started all of a sudden this morning around 2 AM.  He woke up and felt like he had chills and subjective fever.  Has been urinating very frequently since.  There is pain with urination.  He also feels an urgency and frequency to urinate.  Only a small amount comes out.  He is also noticed concomitant right low back pain and this pain radiates down his leg into his lower leg.  Goes down the posterior aspect.  No weakness or numbness.  Feels like when he had a prior kidney stone though that was about 5 years ago.  Pain is severe and he took some ibuprofen which helped a little bit with the chills and pain and then he was given IV fentanyl by EMS.  Some nausea but no vomiting.  The pain is much more tolerable right now and fairly comfortable.  Past Medical History:  Diagnosis Date  . Benign prostatic hypertrophy   . GERD (gastroesophageal reflux disease)   . Gout   . History of blood transfusion    pt has antibodies in his blood since previous transfusions  . History of cirrhosis of liver S/P TRANSPLANT 2009  . History of liver failure S/P TRANSPLANT  . Hypertension   . Left ureteral calculus     Patient Active Problem List   Diagnosis Date Noted  . Liver transplant status ([place]) 10/10/2017  . Advance directive discussed with patient 10/10/2017  . Chronic kidney disease, stage III (moderate) ([place]) 10/09/2016  . Mallory-Weiss tear 04/16/2016  . Long-term use of immunosuppressant medication 04/08/2016  . Sleep apnea 10/06/2015  . 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 Ureteral stone with hydronephrosis 03/02/2012  . Hypertension   . Routine  general medical examination at a health care facility 04/26/2011  . Chronic liver failure ([place]) 09/24/2010  . GOUT 12/21/2008  . BPH without urinary obstruction 12/21/2008  . ALLERGIC RHINITIS 05/22/2007  . GERD 05/22/2007  . HIATAL HERNIA 05/22/2007  . IRRITABLE BOWEL SYNDROME, HX OF 05/22/2007  . RENAL CALCULUS, HX OF 05/22/2007    Past Surgical History:  Procedure Laterality Date  . bone morrow biopsy    . CHOLECYSTECTOMY  2007  . CYSTO/ LEFT RETROGRADE PYELOGRAM/ LEFT URETERAL STENT PLACEMENT  03-05-2012  DR Tresa Moore [facility])   LEFT URETERAL CALCULI  . CYSTOSCOPY W/ URETERAL STENT PLACEMENT  03/11/2012   Procedure: CYSTOSCOPY WITH STENT REPLACEMENT;  Surgeon: Alexis Frock, MD;  Location: [facility];  Service: Urology;  Laterality: Left;  . HERNIA REPAIR  2008  . LIVER BIOPSY    . LIVER TRANSPLANT  11/24/2007   pt states doing well since liver transplant  . LUMBAR DISC SURGERY    . LUMBAR FUSION    . removal of fistula    . URETEROSCOPY  03/11/2012   Procedure: URETEROSCOPY;  Surgeon: Alexis Frock, MD;  Location: [facility];  Service: Urology;  Laterality: Left;   STONE MANIPULATION, stone obtained  [place] Medications    Prior to Admission medications   Medication Sig Start Date End Date Taking? Authorizing  Provider  calcium carbonate (TUMS) 500 MG chewable tablet Chew 1 tablet (200 mg of elemental calcium total) by mouth 2 (two) times daily as needed for indigestion or heartburn. 06/26/16  Yes Tonia Ghent, MD  colchicine 0.6 MG tablet TAKE 1 TABLET BY MOUTH 2 (TWO) TIMES DAILY AS NEEDED (FOR GOUT). Patient taking differently: Take 0.6 mg by mouth daily as needed (gout).  02/27/18  Yes Venia Carbon, MD  losartan (COZAAR) 50 MG tablet TAKE 1 TABLET BY MOUTH DAILY 01/14/18  Yes Venia Carbon, MD  mycophenolate (CELLCEPT) 250 MG capsule Take 500 mg by mouth 2 (two) times daily.  03/10/15  Yes [provider]  omeprazole (PRILOSEC) 20 MG capsule TAKE ONE (1) CAPSULE BY MOUTH 2 TIMES DAILY 01/12/18  Yes Venia Carbon, MD  predniSONE (DELTASONE) 5 MG tablet Take 1 tablet (5 mg total) by mouth daily with breakfast. 04/15/16  Yes Tonia Ghent, MD  tacrolimus (PROGRAF) 1 MG capsule Take 1 mg by mouth See admin instructions. 2 mg in the am and  1 mg at night   Yes [provider]  tamsulosin (FLOMAX) 0.4 MG CAPS capsule TAKE ONE CAPSULE BY MOUTH DAILY 01/14/18  Yes Venia Carbon, MD  albuterol (PROVENTIL HFA;VENTOLIN HFA) 108 (90 Base) MCG/ACT inhaler Inhale 2 puffs into the lungs every 4 (four) hours as needed for wheezing. Patient not taking: Reported on 08/04/2018 08/25/17   Tower, Wynelle Fanny, MD  metoprolol succinate (TOPROL-XL) 50 MG 24 hr tablet TAKE 1 TABLET BY MOUTH DAILY Patient not taking: Reported on 08/04/2018 07/20/15   Venia Carbon, MD  nitroGLYCERIN (NITROSTAT) 0.4 MG SL tablet Place 1 tablet (0.4 mg total) under the tongue every 5 (five) minutes as needed for chest pain. 11/29/15   Wende Bushy, MD    Family History Family History  Problem Relation Age of Onset  . Cancer Mother        colon  . Arthritis Mother   . Vasculitis Mother   . Hypertension Mother   . Cancer Father        colon  . Kidney disease Father   . Arthritis Father   . Arthritis Brother   . Alcohol abuse Maternal Aunt   . Diabetes Maternal Aunt   . Alcohol abuse Maternal Uncle   . Diabetes Paternal Aunt   . Alcohol abuse Maternal Uncle   . Alcohol abuse Maternal Uncle     Social History Social History   Tobacco Use  . Smoking status: Never Smoker  . Smokeless tobacco: Never Used  Substance Use Topics  . Alcohol use: No  . Drug use: No     Allergies   Patient has no known allergies.   Review of Systems Review of Systems  Constitutional: Positive for chills and fever (subjective).  Gastrointestinal: Positive for abdominal pain and nausea. Negative for vomiting.  Genitourinary:  Positive for dysuria, flank pain, frequency and urgency.  Musculoskeletal: Positive for back pain.  Neurological: Negative for weakness and numbness.  All other systems reviewed and are negative.    Physical Exam Updated Vital Signs BP 119/74   Pulse 90   Temp 99.3 F (37.4 C) (Rectal)   Resp 20   Ht 6\' 1"  (1.854 m)   Wt 117.9 kg   SpO2 95%   BMI 34.30 kg/m   Physical Exam Vitals signs and nursing note reviewed.  Constitutional:      Appearance: He is well-developed. He is obese. He is not  diaphoretic.  HENT:     Head: Normocephalic and atraumatic.     Right Ear: External ear normal.     Left Ear: External ear normal.     Nose: Nose normal.  Eyes:     General:        Right eye: No discharge.        Left eye: No discharge.  Neck:     Musculoskeletal: Neck supple.  Cardiovascular:     Rate and Rhythm: Normal rate and regular rhythm.     Heart sounds: Normal heart sounds.  Pulmonary:     Effort: Pulmonary effort is normal.     Breath sounds: Normal breath sounds.  Abdominal:     Palpations: Abdomen is soft.     Tenderness: There is abdominal tenderness in the right upper quadrant and right lower quadrant. There is no right CVA tenderness or left CVA tenderness.    Musculoskeletal:     Thoracic back: He exhibits no bony tenderness.     Lumbar back: He exhibits tenderness. He exhibits no bony tenderness.       Back:  Skin:    General: Skin is warm and dry.  Neurological:     Mental Status: He is alert.     Comments: 5/5 strength in BLE. Normal gross sensation  Psychiatric:        Mood and Affect: Mood is not anxious.      ED Treatments / Results  Labs (all labs ordered are listed, but only abnormal results are displayed) Labs Reviewed  URINALYSIS, ROUTINE W REFLEX MICROSCOPIC - Abnormal; Notable for the following components:      Result Value   Color, Urine AMBER (*)    APPearance CLOUDY (*)    Hgb urine dipstick MODERATE (*)    Protein, ur 100 (*)     Nitrite POSITIVE (*)    Leukocytes,Ua MODERATE (*)    RBC / HPF >50 (*)    WBC, UA >50 (*)    Bacteria, UA FEW (*)    All other components within normal limits  COMPREHENSIVE METABOLIC PANEL - Abnormal; Notable for the following components:   Potassium 3.0 (*)    Glucose, Bld 114 (*)    Creatinine, Ser 1.71 (*)    Calcium 8.4 (*)    Total Protein 6.3 (*)    Total Bilirubin 2.7 (*)    GFR calc non Af Amer 43 (*)    GFR calc Af Amer 49 (*)    All other components within normal limits  CBC WITH DIFFERENTIAL/PLATELET - Abnormal; Notable for the following components:   WBC 18.6 (*)    Platelets 147 (*)    Neutro Abs 15.6 (*)    Lymphs Abs 0.5 (*)    Monocytes Absolute 2.1 (*)    Abs Immature Granulocytes 0.27 (*)    All other components within normal limits  URINE CULTURE    EKG EKG Interpretation  Date/Time:  Tuesday August 04 2018 14:25:21 EST Ventricular Rate:  90 PR Interval:    QRS Duration: 83 QT Interval:  348 QTC Calculation: 426 R Axis:   -2 Text Interpretation:  Sinus rhythm Abnormal R-wave progression, early transition Left ventricular hypertrophy Borderline T abnormalities, inferior leads Baseline wander in lead(s) V1 Confirmed by Sherwood Gambler [phone]) on 08/04/2018 3:37:18 PM   Radiology Ct Renal Stone Study  Result Date: 08/04/2018 CLINICAL DATA:  63 year old with right flank pain. Stone disease suspected. EXAM: CT ABDOMEN AND PELVIS WITHOUT CONTRAST TECHNIQUE: Multidetector CT  imaging of the abdomen and pelvis was performed following the standard protocol without IV contrast. COMPARISON:  10/16/2012 FINDINGS: Lower chest: 4 mm nodule along the right major fissure on sequence 6, image 3. Atelectasis at both lung bases. No large pleural effusions. Hepatobiliary: Gallbladder has been removed. No acute abnormality to the liver. Pancreas: Unremarkable. No pancreatic ductal dilatation or surrounding inflammatory changes. Spleen: Normal in size without focal  abnormality. Adrenals/Urinary Tract: Stable low-density nodule involving the right adrenal gland that measures roughly 1.1 cm. Findings are suggestive for an adenoma. Question a small myelolipoma in the right adrenal gland. Left adrenal gland is unremarkable. Punctate calcification in the right kidney upper pole. 2 mm stone in the anterior mid right kidney. 2.5 cm low-density structure in medial right kidney is suggestive for a cyst. Negative for right hydronephrosis. Urinary bladder is decompressed. 4 mm stone in the mid left kidney. Probable cyst in the medial left kidney. Negative for left hydronephrosis. Question a small cyst in left kidney lower pole. No ureter stones. Stomach/Bowel: Stomach is within normal limits. Appendix appears normal. No evidence of bowel wall thickening, distention, or inflammatory changes. Vascular/Lymphatic: Atherosclerotic calcifications in the aorta and iliac arteries without aneurysm. No lymph node enlargement in the abdomen or pelvis. Reproductive: Seminal vesicles are mildly prominent but stable. Prostate is unremarkable. Other: Negative for free fluid. Small umbilical hernia containing fat. Negative for free air. Musculoskeletal: Pedicle screw and rod fixation at L4, L5 and S1. IMPRESSION: 1. Small bilateral kidney stones.  Negative for hydronephrosis. 2. Probable bilateral renal cysts. 3. Atelectasis at the lung bases. Indeterminate 4 mm nodule near the right major fissure. No follow-up needed if patient is low-risk. Non-contrast chest CT can be considered in 12 months if patient is high-risk. This recommendation follows the consensus statement: Guidelines for Management of Incidental Pulmonary Nodules Detected on CT Images: From the Fleischner Society 2017; Radiology 2017; 284:228-243. 4.  Aortic Atherosclerosis (ICD10-I70.0). Electronically Signed   By: Markus Daft M.D.   On: 08/04/2018 12:39    Procedures Procedures (including critical care time)  Medications Ordered in  ED Medications  sodium chloride 0.9 % bolus 1,000 mL (0 mLs Intravenous Stopped 08/04/18 1241)  fentaNYL (SUBLIMAZE) injection 50 mcg (50 mcg Intravenous Given 08/04/18 1259)  potassium chloride SA (K-DUR,KLOR-CON) CR tablet 40 mEq (40 mEq Oral Given 08/04/18 1259)  cefTRIAXone (ROCEPHIN) 1 g in sodium chloride 0.9 % 100 mL IVPB ( Intravenous Stopped 08/04/18 1335)  acetaminophen (TYLENOL) tablet 650 mg (650 mg Oral Given 08/04/18 1447)  lactated ringers bolus 1,000 mL (1,000 mLs Intravenous New Bag/Given 08/04/18 1446)     Initial Impression / Assessment and Plan / ED Course  I have reviewed the triage vital signs and the nursing notes.  Pertinent labs & imaging results that were available during my care of the patient were reviewed by me and considered in my medical decision making (see chart for details).        Patient's urine shows UTI.  He was given a dose of IV Rocephin.  He also had part of his hypokalemia repleted.  However he has significant pain in his right low back and there is no ureteral stone.  Given the tingling and pain going down his leg with fever at home, I think is reasonable to get MRI to make sure this is not a spinal process.  Care transferred to Dr. Melina Copa with MRI pending.  Final Clinical Impressions(s) / ED Diagnoses   Final diagnoses:  Acute urinary tract  infection  Lung nodule < 6cm on CT    ED Discharge Orders    None       Sherwood Gambler, MD 08/04/18 [phone]

## 2018-08-04 NOTE — ED Notes (Signed)
Bed: QW38 Expected date:  Expected time:  Means of arrival:  Comments: EMS 62yo flank pain, hx of kidney stones

## 2018-08-04 NOTE — ED Notes (Signed)
Patient transported to MRI 

## 2018-08-04 NOTE — ED Triage Notes (Signed)
Pt BIBA from home. Pt has hx of kidney stones. Pt states pain on the right side from his flank down to his groin.  Pt received 54mcg fentanyl en route.

## 2018-08-04 NOTE — H&P (Addendum)
History and Physical    Devin White RSW:[id number] DOB: 05/04/1956 DOA: 08/04/2018  PCP: Venia Carbon, MD  Patient coming from: Home  I have personally briefly reviewed patient's old medical records in [place]  Chief Complaint: Dysuria, back pain  HPI: Devin White is a 63 y.o. male with medical history significant of liver transplant for cirrhosis, on chronic immunosuppressive therapy including 5mg  prednisone daily.  Also has h/o lumbar fusion in past.  Patient presents to the ED with c/o back pain, dysuria, urinary urgency, chills and subjective fever.  Also has R sided low back pain with radiation to R hip and down R leg posterior aspect.  Pain is severe, not relieved by Ibuprofen.   ED Course: UA confirms UTI findings, CT abd/pelvis shows no evidence of hydronephrosis or other suspicious findings.  MRI of L spine shows intact PLIF without mal-alignment or evidence of infection.   Review of Systems: As per HPI otherwise 10 point review of systems negative.   Past Medical History:  Diagnosis Date  . Benign prostatic hypertrophy   . GERD (gastroesophageal reflux disease)   . Gout   . History of blood transfusion    pt has antibodies in his blood since previous transfusions  . History of cirrhosis of liver S/P TRANSPLANT 2009  . History of liver failure S/P TRANSPLANT  . Hypertension   . Left ureteral calculus     Past Surgical History:  Procedure Laterality Date  . bone morrow biopsy    . CHOLECYSTECTOMY  2007  . CYSTO/ LEFT RETROGRADE PYELOGRAM/ LEFT URETERAL STENT PLACEMENT  03-05-2012  DR Tresa [facility])   LEFT URETERAL CALCULI  . CYSTOSCOPY W/ URETERAL STENT PLACEMENT  03/11/2012   Procedure: CYSTOSCOPY WITH STENT REPLACEMENT;  Surgeon: Alexis Frock, MD;  Location: [facility];  Service: Urology;  Laterality: Left;  . HERNIA REPAIR  2008  . LIVER BIOPSY    . LIVER TRANSPLANT  11/24/2007   pt states doing well since liver transplant   . LUMBAR DISC SURGERY    . LUMBAR FUSION    . removal of fistula    . URETEROSCOPY  03/11/2012   Procedure: URETEROSCOPY;  Surgeon: Alexis Frock, MD;  Location: [facility];  Service: Urology;  Laterality: Left;   STONE MANIPULATION, stone obtained  [facility]     reports that he has never smoked. He has never used smokeless tobacco. He reports that he does not drink alcohol or use drugs.  No Known Allergies  Family History  Problem Relation Age of Onset  . Cancer Mother        colon  . Arthritis Mother   . Vasculitis Mother   . Hypertension Mother   . Cancer Father        colon  . Kidney disease Father   . Arthritis Father   . Arthritis Brother   . Alcohol abuse Maternal Aunt   . Diabetes Maternal Aunt   . Alcohol abuse Maternal Uncle   . Diabetes Paternal Aunt   . Alcohol abuse Maternal Uncle   . Alcohol abuse Maternal Uncle      Prior to Admission medications   Medication Sig Start Date End Date Taking? Authorizing Provider  calcium carbonate (TUMS) 500 MG chewable tablet Chew 1 tablet (200 mg of elemental calcium total) by mouth 2 (two) times daily as needed for indigestion or heartburn. 06/26/16  Yes Tonia Ghent, MD  colchicine 0.6 MG tablet TAKE 1  TABLET BY MOUTH 2 (TWO) TIMES DAILY AS NEEDED (FOR GOUT). Patient taking differently: Take 0.6 mg by mouth daily as needed (gout).  02/27/18  Yes Venia Carbon, MD  losartan (COZAAR) 50 MG tablet TAKE 1 TABLET BY MOUTH DAILY 01/14/18  Yes Venia Carbon, MD  mycophenolate (CELLCEPT) 250 MG capsule Take 500 mg by mouth 2 (two) times daily.  03/10/15  Yes [provider]  omeprazole (PRILOSEC) 20 MG capsule TAKE ONE (1) CAPSULE BY MOUTH 2 TIMES DAILY 01/12/18  Yes Venia Carbon, MD  predniSONE (DELTASONE) 5 MG tablet Take 1 tablet (5 mg total) by mouth daily with breakfast. 04/15/16  Yes Tonia Ghent, MD  tacrolimus (PROGRAF) 1 MG capsule Take 1 mg by mouth See admin  instructions. 2 mg in the am and  1 mg at night   Yes [provider]  tamsulosin (FLOMAX) 0.4 MG CAPS capsule TAKE ONE CAPSULE BY MOUTH DAILY 01/14/18  Yes Venia Carbon, MD  nitroGLYCERIN (NITROSTAT) 0.4 MG SL tablet Place 1 tablet (0.4 mg total) under the tongue every 5 (five) minutes as needed for chest pain. 11/29/15   Wende Bushy, MD    Physical Exam: Vitals:   08/04/18 1628 08/04/18 1630 08/04/18 1922 08/04/18 2148  BP: 118/72 127/83 (!) 146/98 (!) 154/86  Pulse: 88 89 92 (!) 102  Resp: 18 16 17 17   Temp:   98.8 F (37.1 C) 98.9 F (37.2 C)  TempSrc:   Oral Oral  SpO2: 96% 96% 98% 94%  Weight:      Height:        Constitutional: NAD, calm, comfortable Eyes: PERRL, lids and conjunctivae normal ENMT: Mucous membranes are moist. Posterior pharynx clear of any exudate or lesions.Normal dentition.  Neck: normal, supple, no masses, no thyromegaly Respiratory: clear to auscultation bilaterally, no wheezing, no crackles. Normal respiratory effort. No accessory muscle use.  Cardiovascular: Regular rate and rhythm, no murmurs / rubs / gallops. No extremity edema. 2+ pedal pulses. No carotid bruits.  Abdomen: no tenderness, no masses palpated. No hepatosplenomegaly. Bowel sounds positive.  Musculoskeletal: no clubbing / cyanosis. No joint deformity upper and lower extremities. Good ROM, no contractures. Normal muscle tone.  Skin: no rashes, lesions, ulcers. No induration Neurologic: CN 2-12 grossly intact. Sensation intact, DTR normal. Strength 5/5 in all 4.  Psychiatric: Normal judgment and insight. Alert and oriented x 3. Normal mood.    Labs on Admission: I have personally reviewed following labs and imaging studies  CBC: Recent Labs  Lab 08/04/18 1130  WBC 18.6*  NEUTROABS 15.6*  HGB 14.8  HCT 44.3  MCV 97.8  PLT 952*   Basic Metabolic Panel: Recent Labs  Lab 08/04/18 1130  NA 139  K 3.0*  CL 108  CO2 22  GLUCOSE 114*  BUN 18  CREATININE 1.71*   CALCIUM 8.4*   GFR: Estimated Creatinine Clearance: 61.8 mL/min (A) (by C-G formula based on SCr of 1.71 mg/dL (H)). Liver Function Tests: Recent Labs  Lab 08/04/18 1130  AST 21  ALT 31  ALKPHOS 44  BILITOT 2.7*  PROT 6.3*  ALBUMIN 3.6   No results for input(s): LIPASE, AMYLASE in the last 168 hours. No results for input(s): AMMONIA in the last 168 hours. Coagulation Profile: No results for input(s): INR, PROTIME in the last 168 hours. Cardiac Enzymes: No results for input(s): CKTOTAL, CKMB, CKMBINDEX, TROPONINI in the last 168 hours. BNP (last 3 results) No results for input(s): PROBNP in the last 8760  hours. HbA1C: No results for input(s): HGBA1C in the last 72 hours. CBG: No results for input(s): GLUCAP in the last 168 hours. Lipid Profile: No results for input(s): CHOL, HDL, LDLCALC, TRIG, CHOLHDL, LDLDIRECT in the last 72 hours. Thyroid Function Tests: No results for input(s): TSH, T4TOTAL, FREET4, T3FREE, THYROIDAB in the last 72 hours. Anemia Panel: No results for input(s): VITAMINB12, FOLATE, FERRITIN, TIBC, IRON, RETICCTPCT in the last 72 hours. Urine analysis:    Component Value Date/Time   COLORURINE AMBER (A) 08/04/2018 1231   APPEARANCEUR CLOUDY (A) 08/04/2018 1231   APPEARANCEUR Clear 11/30/2013 1959   LABSPEC 1.021 08/04/2018 1231   LABSPEC 1.017 11/30/2013 1959   PHURINE 5.0 08/04/2018 1231   GLUCOSEU NEGATIVE 08/04/2018 1231   GLUCOSEU Negative 11/30/2013 1959   HGBUR MODERATE (A) 08/04/2018 1231   HGBUR negative 05/25/2010 1153   BILIRUBINUR NEGATIVE 08/04/2018 1231   BILIRUBINUR Negative 11/30/2013 1959   KETONESUR NEGATIVE 08/04/2018 1231   PROTEINUR 100 (A) 08/04/2018 1231   UROBILINOGEN 0.2 08/25/2012 2256   NITRITE POSITIVE (A) 08/04/2018 1231   LEUKOCYTESUR MODERATE (A) 08/04/2018 1231   LEUKOCYTESUR Negative 11/30/2013 1959    Radiological Exams on Admission: Mr Lumbar Spine W Wo Contrast  Result Date: 08/04/2018 CLINICAL DATA:  63  y/o M; back pain, cauda equina syndrome suspected. EXAM: MRI LUMBAR SPINE WITHOUT AND WITH CONTRAST TECHNIQUE: Multiplanar and multiecho pulse sequences of the lumbar spine were obtained without and with intravenous contrast. CONTRAST:  10 cc Gadavist COMPARISON:  08/04/2018 CT abdomen and pelvis. 10/11/2008 MRI of the lumbar spine. FINDINGS: Segmentation:  Standard. Alignment:  Physiologic. Vertebrae: L4-S1 PLIF. Susceptibility artifact from the fusion hardware partially obscures the vertebral bodies, spinal canal, and neural foramen throughout the levels of fusion. No findings of fracture or discitis. No suspicious bone lesion. No abnormal enhancement. Conus medullaris and cauda equina: Conus extends to the L1 level. Conus and cauda equina appear normal. No abnormal enhancement. Paraspinal and other soft tissues: Chronic for surgical changes within the subcutaneous fat and paraspinal muscles the levels of lumbar fusion. Partially visualized left kidney interpolar cyst. Disc levels: L1-2: No significant disc displacement, foraminal stenosis, or canal stenosis. L2-3: Mild disc bulge. No significant foraminal or spinal canal stenosis. L3-4: Mild disc bulge and facet hypertrophy. No significant foraminal or spinal canal stenosis. L4-5: The level is partially obscured by susceptibility artifact from fusion hardware. No spinal canal stenosis. Neural foramen appear patent. L5-S1: The level is partially obscured by susceptibility artifact from fusion hardware. No spinal canal stenosis. Neural foramen appear patent. IMPRESSION: 1. L4-S1 PLIF chronic postsurgical changes. No acute osseous abnormality or malalignment. 2. Mild lumbar spondylosis. No significant foraminal or spinal canal stenosis. No findings of cauda equina syndrome. Electronically Signed   By: Kristine Garbe M.D.   On: 08/04/2018 18:06   Ct Renal Stone Study  Result Date: 08/04/2018 CLINICAL DATA:  63 year old with right flank pain. Stone  disease suspected. EXAM: CT ABDOMEN AND PELVIS WITHOUT CONTRAST TECHNIQUE: Multidetector CT imaging of the abdomen and pelvis was performed following the standard protocol without IV contrast. COMPARISON:  10/16/2012 FINDINGS: Lower chest: 4 mm nodule along the right major fissure on sequence 6, image 3. Atelectasis at both lung bases. No large pleural effusions. Hepatobiliary: Gallbladder has been removed. No acute abnormality to the liver. Pancreas: Unremarkable. No pancreatic ductal dilatation or surrounding inflammatory changes. Spleen: Normal in size without focal abnormality. Adrenals/Urinary Tract: Stable low-density nodule involving the right adrenal gland that measures roughly 1.1 cm. Findings  are suggestive for an adenoma. Question a small myelolipoma in the right adrenal gland. Left adrenal gland is unremarkable. Punctate calcification in the right kidney upper pole. 2 mm stone in the anterior mid right kidney. 2.5 cm low-density structure in medial right kidney is suggestive for a cyst. Negative for right hydronephrosis. Urinary bladder is decompressed. 4 mm stone in the mid left kidney. Probable cyst in the medial left kidney. Negative for left hydronephrosis. Question a small cyst in left kidney lower pole. No ureter stones. Stomach/Bowel: Stomach is within normal limits. Appendix appears normal. No evidence of bowel wall thickening, distention, or inflammatory changes. Vascular/Lymphatic: Atherosclerotic calcifications in the aorta and iliac arteries without aneurysm. No lymph node enlargement in the abdomen or pelvis. Reproductive: Seminal vesicles are mildly prominent but stable. Prostate is unremarkable. Other: Negative for free fluid. Small umbilical hernia containing fat. Negative for free air. Musculoskeletal: Pedicle screw and rod fixation at L4, L5 and S1. IMPRESSION: 1. Small bilateral kidney stones.  Negative for hydronephrosis. 2. Probable bilateral renal cysts. 3. Atelectasis at the lung  bases. Indeterminate 4 mm nodule near the right major fissure. No follow-up needed if patient is low-risk. Non-contrast chest CT can be considered in 12 months if patient is high-risk. This recommendation follows the consensus statement: Guidelines for Management of Incidental Pulmonary Nodules Detected on CT Images: From the Fleischner Society 2017; Radiology 2017; 284:228-243. 4.  Aortic Atherosclerosis (ICD10-I70.0). Electronically Signed   By: Markus Daft M.D.   On: 08/04/2018 12:39    EKG: Independently reviewed.  Assessment/Plan Principal Problem:   Acute pyelonephritis Active Problems:   Long-term use of immunosuppressant medication   Chronic kidney disease, stage III (moderate) (HCC)   Liver transplant status (HCC)   Pulmonary nodule    1. Acute pyelonephritis - 1. Rocephin 2. Cultures pending 2. Long term use of immunosuppressants for liver transplant - 1. Continue cellcept and mycophenolate 2. Increase prednisone dose to 15mg  PO daily as a "sick dose" of steroids 3. CKD stage 3 - chronic and baseline 4. Pulmonary nodule - 1. Patient a never smoker 2. Does seem to be lower risk 3. Consider follow up CT at 12 months vs no follow up.  DVT prophylaxis: Lovenox Code Status: Full Family Communication: No family in room Disposition Plan: Home after admit Consults called: None Admission status: Place in 11    [place], [place] Hospitalists  How to contact the [facility] Attending or Consulting provider [place] or covering provider during after hours [place], for this patient?  1. Check the care team in [facility] and look for a) attending/consulting [facility] provider listed and b) the [facility] team listed 2. Log into [email or web address]  Amion Physician Scheduling and messaging for groups and whole hospitals  On call and physician scheduling software for group practices, residents, hospitalists and other medical providers for call, clinic, rotation and shift schedules. OnCall [employer] is a  hospital-wide system for scheduling doctors and paging doctors on call. EasyPlot is for scientific plotting and data analysis.  [email or web address]  and use [place]'s universal password to access. If you do not have the password, please contact the hospital operator.  3. Locate the [facility] provider you are looking for under [facility] and page to a number that you can be directly reached. 4. If you still have difficulty reaching the provider, please page the [facility] (Director on Call) for the Hospitalists listed on amion for assistance.  08/04/2018, 10:20 PM

## 2018-08-05 ENCOUNTER — Other Ambulatory Visit: Payer: Self-pay

## 2018-08-05 DIAGNOSIS — Z944 Liver transplant status: Secondary | ICD-10-CM | POA: Diagnosis not present

## 2018-08-05 DIAGNOSIS — K219 Gastro-esophageal reflux disease without esophagitis: Secondary | ICD-10-CM | POA: Diagnosis present

## 2018-08-05 DIAGNOSIS — Z79899 Other long term (current) drug therapy: Secondary | ICD-10-CM | POA: Diagnosis not present

## 2018-08-05 DIAGNOSIS — M109 Gout, unspecified: Secondary | ICD-10-CM | POA: Diagnosis present

## 2018-08-05 DIAGNOSIS — N183 Chronic kidney disease, stage 3 (moderate): Secondary | ICD-10-CM | POA: Diagnosis present

## 2018-08-05 DIAGNOSIS — R51 Headache: Secondary | ICD-10-CM | POA: Diagnosis not present

## 2018-08-05 DIAGNOSIS — M7989 Other specified soft tissue disorders: Secondary | ICD-10-CM | POA: Diagnosis not present

## 2018-08-05 DIAGNOSIS — Z841 Family history of disorders of kidney and ureter: Secondary | ICD-10-CM | POA: Diagnosis not present

## 2018-08-05 DIAGNOSIS — N12 Tubulo-interstitial nephritis, not specified as acute or chronic: Secondary | ICD-10-CM | POA: Diagnosis present

## 2018-08-05 DIAGNOSIS — R911 Solitary pulmonary nodule: Secondary | ICD-10-CM | POA: Diagnosis present

## 2018-08-05 DIAGNOSIS — N10 Acute tubulo-interstitial nephritis: Secondary | ICD-10-CM | POA: Diagnosis not present

## 2018-08-05 DIAGNOSIS — N401 Enlarged prostate with lower urinary tract symptoms: Secondary | ICD-10-CM | POA: Diagnosis present

## 2018-08-05 DIAGNOSIS — Z7952 Long term (current) use of systemic steroids: Secondary | ICD-10-CM | POA: Diagnosis not present

## 2018-08-05 DIAGNOSIS — B9620 Unspecified Escherichia coli [E. coli] as the cause of diseases classified elsewhere: Secondary | ICD-10-CM | POA: Diagnosis present

## 2018-08-05 DIAGNOSIS — N390 Urinary tract infection, site not specified: Secondary | ICD-10-CM | POA: Diagnosis present

## 2018-08-05 DIAGNOSIS — I129 Hypertensive chronic kidney disease with stage 1 through stage 4 chronic kidney disease, or unspecified chronic kidney disease: Secondary | ICD-10-CM | POA: Diagnosis present

## 2018-08-05 LAB — BASIC METABOLIC PANEL
Anion gap: 8 (ref 5–15)
BUN: 22 mg/dL — ABNORMAL HIGH (ref 6–20)
CHLORIDE: 108 mmol/L (ref 98–111)
CO2: 23 mmol/L (ref 22–32)
Calcium: 8.6 mg/dL — ABNORMAL LOW (ref 8.9–10.3)
Creatinine, Ser: 1.65 mg/dL — ABNORMAL HIGH (ref 0.61–1.24)
GFR calc Af Amer: 52 mL/min — ABNORMAL LOW (ref >60)
GFR calc non Af Amer: 44 mL/min — ABNORMAL LOW (ref >60)
Glucose, Bld: 123 mg/dL — ABNORMAL HIGH (ref 70–99)
Potassium: 4 mmol/L (ref 3.5–5.1)
SODIUM: 139 mmol/L (ref 135–145)

## 2018-08-05 LAB — CBC
HCT: 45.7 % (ref 39.0–52.0)
Hemoglobin: 14.6 g/dL (ref 13.0–17.0)
MCH: 31.4 pg (ref 26.0–34.0)
MCHC: 31.9 g/dL (ref 30.0–36.0)
MCV: 98.3 fL (ref 80.0–100.0)
Platelets: 129 10*3/uL — ABNORMAL LOW (ref 150–400)
RBC: 4.65 MIL/uL (ref 4.22–5.81)
RDW: 13.8 % (ref 11.5–15.5)
WBC: 17.3 10*3/uL — ABNORMAL HIGH (ref 4.0–10.5)
nRBC: 0 % (ref 0.0–0.2)

## 2018-08-05 LAB — HIV ANTIBODY (ROUTINE TESTING W REFLEX): HIV Screen 4th Generation wRfx: NONREACTIVE

## 2018-08-05 MED ORDER — SODIUM CHLORIDE 0.9 % IV SOLN
INTRAVENOUS | Status: DC
  Administered 2018-08-05 – 2018-08-06 (×2): via INTRAVENOUS

## 2018-08-05 MED ORDER — TRAMADOL HCL 50 MG PO TABS
50.0000 mg | ORAL_TABLET | Freq: Once | ORAL | Status: AC
  Administered 2018-08-05: 50 mg via ORAL
  Filled 2018-08-05: qty 1

## 2018-08-05 NOTE — Plan of Care (Signed)
  Problem: Education: Goal: Knowledge of General Education information will improve Description Including pain rating scale, medication(s)/side effects and non-pharmacologic comfort measures Outcome: Progressing   Problem: Health Behavior/Discharge Planning: Goal: Ability to manage health-related needs will improve Outcome: Progressing   Problem: Clinical Measurements: Goal: Ability to maintain clinical measurements within normal limits will improve Outcome: Progressing Goal: Will remain free from infection Outcome: Progressing Goal: Diagnostic test results will improve Outcome: Progressing Goal: Respiratory complications will improve Outcome: Progressing Goal: Cardiovascular complication will be avoided Outcome: Progressing   Problem: Activity: Goal: Risk for activity intolerance will decrease Outcome: Progressing   Problem: Nutrition: Goal: Adequate nutrition will be maintained Outcome: Progressing   Problem: Coping: Goal: Level of anxiety will decrease Outcome: Progressing   Problem: Pain Managment: Goal: General experience of comfort will improve Outcome: Progressing   Problem: Safety: Goal: Ability to remain free from injury will improve Outcome: Progressing   Problem: Skin Integrity: Goal: Risk for impaired skin integrity will decrease Outcome: Progressing   Problem: Education: Goal: Knowledge of General Education information will improve Description Including pain rating scale, medication(s)/side effects and non-pharmacologic comfort measures Outcome: Progressing   Problem: Health Behavior/Discharge Planning: Goal: Ability to manage health-related needs will improve Outcome: Progressing   Problem: Clinical Measurements: Goal: Ability to maintain clinical measurements within normal limits will improve Outcome: Progressing Goal: Will remain free from infection Outcome: Progressing Goal: Diagnostic test results will improve Outcome: Progressing Goal:  Respiratory complications will improve Outcome: Progressing Goal: Cardiovascular complication will be avoided Outcome: Progressing   Problem: Activity: Goal: Risk for activity intolerance will decrease Outcome: Progressing   Problem: Nutrition: Goal: Adequate nutrition will be maintained Outcome: Progressing   Problem: Coping: Goal: Level of anxiety will decrease Outcome: Progressing   Problem: Pain Managment: Goal: General experience of comfort will improve Outcome: Progressing   Problem: Safety: Goal: Ability to remain free from injury will improve Outcome: Progressing   Problem: Skin Integrity: Goal: Risk for impaired skin integrity will decrease Outcome: Progressing

## 2018-08-05 NOTE — Progress Notes (Signed)
Initial Nutrition Assessment  DOCUMENTATION CODES:   Obesity unspecified  INTERVENTION:  - Continue to encourage PO intakes.   NUTRITION DIAGNOSIS:   Inadequate oral intake related to acute illness as evidenced by per patient/family report.  GOAL:   Patient will meet greater than or equal to 90% of their needs  MONITOR:   PO intake, Weight trends, Labs  REASON FOR ASSESSMENT:   Malnutrition Screening Tool  ASSESSMENT:   63 y.o. male with medical history of BPH, GERD, gout, HTN, and liver transplant for cirrhosis on chronic immunosuppressive therapy including 5mg  prednisone daily. He has hx of lumbar fusion. Patient presented to the ED with c/o back pain, dysuria, urinary urgency, chills and subjective fever. Pain was severe and not relieved by Ibuprofen at home.  UA confirms UTI, CT abd/pelvis shows no evidence of hydronephrosis or other suspicious findings.   Patient consumed 75% of breakfast (390 kcal, 25 grams of protein) and 100% of lunch (620 kcal, 30 grams of protein) today. He reports good appetite at this time and eating without any chewing or swallowing issues now or PTA. He reports decreased appetite for several days PTA related to severe back pain and lack of interest in eating. Patient usually has a good appetite. No abdominal pain or nausea at this time.   Patient reports UBW of 270 lb and is unsure of the last time he weighed this. Per chart review, current weight is 260 lb (appears to be a stated weight) and weight on 03/30/18 was 273 lb. This indicates 13 lb weight loss (5% body weight) in 5 months. Not significant for time frame.  Patient had renal transplant in 2009 and has no issues related to foods or beverages or items that he avoids d/t this.    Medications reviewed; 40 mg oral protonix BID, 15 mg deltasone/day, 2 mg + 1 mg prograf/day. Labs reviewed; BUN: 22 mg/dl, creatinine: 1.65 mg/dl, Ca: 8.6 mg/dl, GFR: 44 ml/min.       NUTRITION - FOCUSED  PHYSICAL EXAM:  Completed; no muscle and no fat wasting.   Diet Order:   Diet Order            Diet Heart Room service appropriate? Yes; Fluid consistency: Thin  Diet effective now              EDUCATION NEEDS:   No education needs have been identified at this time  Skin:  Skin Assessment: Reviewed RN Assessment  Last BM:  2/18  Height:   Ht Readings from Last 1 Encounters:  08/04/18 6\' 1"  (1.854 m)    Weight:   Wt Readings from Last 1 Encounters:  08/04/18 117.9 kg    Ideal Body Weight:  83.64 kg  BMI:  Body mass index is 34.3 kg/m.  Estimated Nutritional Needs:   Kcal:  1770-2005 kcal  Protein:  85-95 grams  Fluid:  >/= 1.8 L/day     Jarome Matin, MS, RD, LDN, [facility] Inpatient Clinical Dietitian Pager # [phone] After hours/weekend pager # [phone]

## 2018-08-05 NOTE — Progress Notes (Signed)
PROGRESS NOTE  CHORD TAKAHASHI XTK:[id number] DOB: [birth year] DOA: 08/04/2018 PCP: Venia Carbon, MD  HPI/Recap of past 24 hours: Devin White is a 63 y.o. male with medical history significant of liver transplant for cirrhosis, on chronic immunosuppressive therapy including 5mg  prednisone daily.  Also has h/o lumbar fusion in past.  Patient presents to the ED with c/o back pain, dysuria, urinary urgency, chills and subjective fever.  Also has R sided low back pain with radiation to R hip and down R leg posterior aspect.  Pain is severe, not relieved by Ibuprofen.  UA confirms UTI findings, CT abd/pelvis shows no evidence of hydronephrosis or other suspicious findings.    IV antibiotics empirically.  Urine culture pending.  06/05/2019: Patient seen and examined at bedside.  Reports persistent dysuria.  Currently on Rocephin empirically.  Awaiting results of sensitivities.   Assessment/Plan: Principal Problem:   Acute pyelonephritis Active Problems:   Long-term use of immunosuppressant medication   Chronic kidney disease, stage III (moderate) (HCC)   Liver transplant status (HCC)   Pulmonary nodule   Acute R pyelonephritis Symptoms suggestive of right pyelonephritis associated with leukocytosis and pyuria Continue Rocephin empirically Urine culture in process Follow sensitivity Leukocytosis is trending down Follow fever curve Obtain CBC in the morning with differential  Long-term use of immunosuppressant for liver transplant Continue CellCept and mycophenolate Prednisone has been increased to 15 mg p.o. daily from 5 mg daily (home dose)  CKD 3 Appears to be at his baseline Creatinine 1.6 with GFR in the 40s Avoid nephrotoxic agents/dehydration/hypotension Repeat BMP in the morning  Pulmonary nodule, incidentally found Noted on CT renal stone study done on 08/04/2018 Revealing 4 mm pulmonary nodule near the right major fissure Repeat imaging in 6 to 12  months  BPH Continue tamsulosin Monitor urine output   Risks: High risk for the compensation due to acute pyelonephritis in the setting of immunosuppression, multiple comorbidities, and advanced age.  Patient will require at least 2 midnights for further evaluation, and administration of IV antibiotics.  We will continue to follow urine culture with sensitivities.   DVT prophylaxis:  Subcu Lovenox daily Code Status: Full Family Communication: No family in room Disposition Plan:  Home possibly in 1 to 2 days when hemodynamically stable. Consults called: None   Objective: Vitals:   08/04/18 1922 08/04/18 2148 08/04/18 2313 08/05/18 0359  BP: (!) 146/98 (!) 154/86 (!) 154/93 (!) 156/98  Pulse: 92 (!) 102 82 76  Resp: 17 17 20 18   Temp: 98.8 F (37.1 C) 98.9 F (37.2 C) 98.4 F (36.9 C) 98.2 F (36.8 C)  TempSrc: Oral Oral Oral Oral  SpO2: 98% 94% 98% 94%  Weight:      Height:        Intake/Output Summary (Last 24 hours) at 08/05/2018 1058 Last data filed at 08/05/2018 1011 Gross per 24 hour  Intake 1921.53 ml  Output 990 ml  Net 931.53 ml   Filed Weights   08/04/18 1010  Weight: 117.9 kg    Exam:  . General: 63 y.o. year-old male well developed well nourished in no acute distress.  Alert and oriented x3. . Cardiovascular: Regular rate and rhythm with no rubs or gallops.  No thyromegaly or JVD noted.   Marland Kitchen Respiratory: Clear to auscultation with no wheezes or rales. Good inspiratory effort. . Abdomen: Soft nontender nondistended with normal bowel sounds x4 quadrants.  Mild tenderness to right flank. . Musculoskeletal: No lower extremity edema. 2/4 pulses in  all 4 extremities. Marland Kitchen Psychiatry: Mood is appropriate for condition and setting   Data Reviewed: CBC: Recent Labs  Lab 08/04/18 1130 08/05/18 0430  WBC 18.6* 17.3*  NEUTROABS 15.6*  --   HGB 14.8 14.6  HCT 44.3 45.7  MCV 97.8 98.3  PLT 147* 528*   Basic Metabolic Panel: Recent Labs  Lab 08/04/18 1130  08/05/18 0430  NA 139 139  K 3.0* 4.0  CL 108 108  CO2 22 23  GLUCOSE 114* 123*  BUN 18 22*  CREATININE 1.71* 1.65*  CALCIUM 8.4* 8.6*   GFR: Estimated Creatinine Clearance: 64 mL/min (A) (by C-G formula based on SCr of 1.65 mg/dL (H)). Liver Function Tests: Recent Labs  Lab 08/04/18 1130  AST 21  ALT 31  ALKPHOS 44  BILITOT 2.7*  PROT 6.3*  ALBUMIN 3.6   No results for input(s): LIPASE, AMYLASE in the last 168 hours. No results for input(s): AMMONIA in the last 168 hours. Coagulation Profile: No results for input(s): INR, PROTIME in the last 168 hours. Cardiac Enzymes: No results for input(s): CKTOTAL, CKMB, CKMBINDEX, TROPONINI in the last 168 hours. BNP (last 3 results) No results for input(s): PROBNP in the last 8760 hours. HbA1C: No results for input(s): HGBA1C in the last 72 hours. CBG: No results for input(s): GLUCAP in the last 168 hours. Lipid Profile: No results for input(s): CHOL, HDL, LDLCALC, TRIG, CHOLHDL, LDLDIRECT in the last 72 hours. Thyroid Function Tests: No results for input(s): TSH, T4TOTAL, FREET4, T3FREE, THYROIDAB in the last 72 hours. Anemia Panel: No results for input(s): VITAMINB12, FOLATE, FERRITIN, TIBC, IRON, RETICCTPCT in the last 72 hours. Urine analysis:    Component Value Date/Time   COLORURINE AMBER (A) 08/04/2018 1231   APPEARANCEUR CLOUDY (A) 08/04/2018 1231   APPEARANCEUR Clear 11/30/2013 1959   LABSPEC 1.021 08/04/2018 1231   LABSPEC 1.017 11/30/2013 1959   PHURINE 5.0 08/04/2018 1231   GLUCOSEU NEGATIVE 08/04/2018 1231   GLUCOSEU Negative 11/30/2013 1959   HGBUR MODERATE (A) 08/04/2018 1231   HGBUR negative 05/25/2010 1153   BILIRUBINUR NEGATIVE 08/04/2018 1231   BILIRUBINUR Negative 11/30/2013 1959   KETONESUR NEGATIVE 08/04/2018 1231   PROTEINUR 100 (A) 08/04/2018 1231   UROBILINOGEN 0.2 08/25/2012 2256   NITRITE POSITIVE (A) 08/04/2018 1231   LEUKOCYTESUR MODERATE (A) 08/04/2018 1231   LEUKOCYTESUR Negative  11/30/2013 1959   Sepsis Labs: @LABRCNTIP (procalcitonin:4,lacticidven:4)  )No results found for this or any previous visit (from the past 240 hour(s)).    Studies: Mr Lumbar Spine W Wo Contrast  Result Date: 08/04/2018 CLINICAL DATA:  63 y/o M; back pain, cauda equina syndrome suspected. EXAM: MRI LUMBAR SPINE WITHOUT AND WITH CONTRAST TECHNIQUE: Multiplanar and multiecho pulse sequences of the lumbar spine were obtained without and with intravenous contrast. CONTRAST:  10 cc Gadavist COMPARISON:  08/04/2018 CT abdomen and pelvis. 10/11/2008 MRI of the lumbar spine. FINDINGS: Segmentation:  Standard. Alignment:  Physiologic. Vertebrae: L4-S1 PLIF. Susceptibility artifact from the fusion hardware partially obscures the vertebral bodies, spinal canal, and neural foramen throughout the levels of fusion. No findings of fracture or discitis. No suspicious bone lesion. No abnormal enhancement. Conus medullaris and cauda equina: Conus extends to the L1 level. Conus and cauda equina appear normal. No abnormal enhancement. Paraspinal and other soft tissues: Chronic for surgical changes within the subcutaneous fat and paraspinal muscles the levels of lumbar fusion. Partially visualized left kidney interpolar cyst. Disc levels: L1-2: No significant disc displacement, foraminal stenosis, or canal stenosis. L2-3: Mild disc bulge.  No significant foraminal or spinal canal stenosis. L3-4: Mild disc bulge and facet hypertrophy. No significant foraminal or spinal canal stenosis. L4-5: The level is partially obscured by susceptibility artifact from fusion hardware. No spinal canal stenosis. Neural foramen appear patent. L5-S1: The level is partially obscured by susceptibility artifact from fusion hardware. No spinal canal stenosis. Neural foramen appear patent. IMPRESSION: 1. L4-S1 PLIF chronic postsurgical changes. No acute osseous abnormality or malalignment. 2. Mild lumbar spondylosis. No significant foraminal or spinal  canal stenosis. No findings of cauda equina syndrome. Electronically Signed   By: Kristine Garbe M.D.   On: 08/04/2018 18:06   Ct Renal Stone Study  Result Date: 08/04/2018 CLINICAL DATA:  63 year old with right flank pain. Stone disease suspected. EXAM: CT ABDOMEN AND PELVIS WITHOUT CONTRAST TECHNIQUE: Multidetector CT imaging of the abdomen and pelvis was performed following the standard protocol without IV contrast. COMPARISON:  10/16/2012 FINDINGS: Lower chest: 4 mm nodule along the right major fissure on sequence 6, image 3. Atelectasis at both lung bases. No large pleural effusions. Hepatobiliary: Gallbladder has been removed. No acute abnormality to the liver. Pancreas: Unremarkable. No pancreatic ductal dilatation or surrounding inflammatory changes. Spleen: Normal in size without focal abnormality. Adrenals/Urinary Tract: Stable low-density nodule involving the right adrenal gland that measures roughly 1.1 cm. Findings are suggestive for an adenoma. Question a small myelolipoma in the right adrenal gland. Left adrenal gland is unremarkable. Punctate calcification in the right kidney upper pole. 2 mm stone in the anterior mid right kidney. 2.5 cm low-density structure in medial right kidney is suggestive for a cyst. Negative for right hydronephrosis. Urinary bladder is decompressed. 4 mm stone in the mid left kidney. Probable cyst in the medial left kidney. Negative for left hydronephrosis. Question a small cyst in left kidney lower pole. No ureter stones. Stomach/Bowel: Stomach is within normal limits. Appendix appears normal. No evidence of bowel wall thickening, distention, or inflammatory changes. Vascular/Lymphatic: Atherosclerotic calcifications in the aorta and iliac arteries without aneurysm. No lymph node enlargement in the abdomen or pelvis. Reproductive: Seminal vesicles are mildly prominent but stable. Prostate is unremarkable. Other: Negative for free fluid. Small umbilical hernia  containing fat. Negative for free air. Musculoskeletal: Pedicle screw and rod fixation at L4, L5 and S1. IMPRESSION: 1. Small bilateral kidney stones.  Negative for hydronephrosis. 2. Probable bilateral renal cysts. 3. Atelectasis at the lung bases. Indeterminate 4 mm nodule near the right major fissure. No follow-up needed if patient is low-risk. Non-contrast chest CT can be considered in 12 months if patient is high-risk. This recommendation follows the consensus statement: Guidelines for Management of Incidental Pulmonary Nodules Detected on CT Images: From the Fleischner Society 2017; Radiology 2017; 284:228-243. 4.  Aortic Atherosclerosis (ICD10-I70.0). Electronically Signed   By: Markus Daft M.D.   On: 08/04/2018 12:39    Scheduled Meds: . enoxaparin (LOVENOX) injection  40 mg Subcutaneous Daily  . mycophenolate  500 mg Oral BID  . pantoprazole  40 mg Oral BID  . predniSONE  15 mg Oral Q breakfast  . tacrolimus  2 mg Oral Daily   And  . tacrolimus  1 mg Oral QHS  . tamsulosin  0.4 mg Oral Daily    Continuous Infusions: . sodium chloride 10 mL/hr at 08/05/18 1055  . cefTRIAXone (ROCEPHIN)  IV 1 g (08/05/18 1056)     LOS: 0 days     Kayleen Memos, MD [facility] Pager [phone]  If 7PM-7AM, please contact night-coverage [email or web address] Password [facility] 08/05/2018,  10:58 AM

## 2018-08-06 ENCOUNTER — Inpatient Hospital Stay (HOSPITAL_COMMUNITY): Payer: Medicare Other

## 2018-08-06 LAB — CBC WITH DIFFERENTIAL/PLATELET
Abs Immature Granulocytes: 0.09 10*3/uL — ABNORMAL HIGH (ref 0.00–0.07)
Basophils Absolute: 0 10*3/uL (ref 0.0–0.1)
Basophils Relative: 0 %
Eosinophils Absolute: 0.1 10*3/uL (ref 0.0–0.5)
Eosinophils Relative: 1 %
HCT: 44 % (ref 39.0–52.0)
HEMOGLOBIN: 14.2 g/dL (ref 13.0–17.0)
Immature Granulocytes: 1 %
Lymphocytes Relative: 12 %
Lymphs Abs: 1.3 10*3/uL (ref 0.7–4.0)
MCH: 32.7 pg (ref 26.0–34.0)
MCHC: 32.3 g/dL (ref 30.0–36.0)
MCV: 101.4 fL — ABNORMAL HIGH (ref 80.0–100.0)
MONOS PCT: 6 %
Monocytes Absolute: 0.6 10*3/uL (ref 0.1–1.0)
Neutro Abs: 8.3 10*3/uL — ABNORMAL HIGH (ref 1.7–7.7)
Neutrophils Relative %: 80 %
Platelets: 136 10*3/uL — ABNORMAL LOW (ref 150–400)
RBC: 4.34 MIL/uL (ref 4.22–5.81)
RDW: 13.5 % (ref 11.5–15.5)
WBC: 10.4 10*3/uL (ref 4.0–10.5)
nRBC: 0 % (ref 0.0–0.2)

## 2018-08-06 LAB — COMPREHENSIVE METABOLIC PANEL
ALT: 25 U/L (ref 0–44)
AST: 14 U/L — ABNORMAL LOW (ref 15–41)
Albumin: 3.3 g/dL — ABNORMAL LOW (ref 3.5–5.0)
Alkaline Phosphatase: 49 U/L (ref 38–126)
Anion gap: 4 — ABNORMAL LOW (ref 5–15)
BUN: 21 mg/dL — ABNORMAL HIGH (ref 6–20)
CO2: 27 mmol/L (ref 22–32)
Calcium: 8.4 mg/dL — ABNORMAL LOW (ref 8.9–10.3)
Chloride: 108 mmol/L (ref 98–111)
Creatinine, Ser: 1.53 mg/dL — ABNORMAL HIGH (ref 0.61–1.24)
GFR calc Af Amer: 56 mL/min — ABNORMAL LOW (ref >60)
GFR calc non Af Amer: 49 mL/min — ABNORMAL LOW (ref >60)
Glucose, Bld: 121 mg/dL — ABNORMAL HIGH (ref 70–99)
Potassium: 3.3 mmol/L — ABNORMAL LOW (ref 3.5–5.1)
Sodium: 139 mmol/L (ref 135–145)
Total Bilirubin: 0.9 mg/dL (ref 0.3–1.2)
Total Protein: 6.3 g/dL — ABNORMAL LOW (ref 6.5–8.1)

## 2018-08-06 LAB — MAGNESIUM: Magnesium: 1.9 mg/dL (ref 1.7–2.4)

## 2018-08-06 LAB — URIC ACID: Uric Acid, Serum: 9.7 mg/dL — ABNORMAL HIGH (ref 3.7–8.6)

## 2018-08-06 MED ORDER — POTASSIUM CHLORIDE CRYS ER 20 MEQ PO TBCR
40.0000 meq | EXTENDED_RELEASE_TABLET | Freq: Once | ORAL | Status: AC
  Administered 2018-08-06: 40 meq via ORAL
  Filled 2018-08-06: qty 2

## 2018-08-06 MED ORDER — HYDRALAZINE HCL 20 MG/ML IJ SOLN
10.0000 mg | Freq: Four times a day (QID) | INTRAMUSCULAR | Status: DC | PRN
  Administered 2018-08-06: 10 mg via INTRAVENOUS
  Filled 2018-08-06: qty 1

## 2018-08-06 MED ORDER — PHENAZOPYRIDINE HCL 200 MG PO TABS
200.0000 mg | ORAL_TABLET | Freq: Three times a day (TID) | ORAL | Status: DC
  Administered 2018-08-06 – 2018-08-08 (×6): 200 mg via ORAL
  Filled 2018-08-06 (×7): qty 1

## 2018-08-06 MED ORDER — AMLODIPINE BESYLATE 5 MG PO TABS
5.0000 mg | ORAL_TABLET | Freq: Every day | ORAL | Status: DC
  Administered 2018-08-06: 5 mg via ORAL
  Filled 2018-08-06: qty 1

## 2018-08-06 NOTE — Progress Notes (Signed)
PROGRESS NOTE  Devin White DOB: 10/02/1955 DOA: 08/04/2018 PCP: Venia Carbon, MD  HPI/Recap of past 24 hours: Devin White is a 63 y.o. male with medical history significant of liver transplant for cirrhosis, on chronic immunosuppressive therapy including 5mg  prednisone daily.  Also has h/o lumbar fusion in past.  Patient presents to the ED with c/o back pain, dysuria, urinary urgency, chills and subjective fever.  Also has R sided low back pain with radiation to R hip and down R leg posterior aspect.  Pain is severe, not relieved by Ibuprofen.  UA confirms UTI findings, CT abd/pelvis shows no evidence of hydronephrosis or other suspicious findings.    IV antibiotics empirically.  Urine culture pending.  08/06/2018: Patient seen and examined at bedside.  No acute events overnight.  Reports significant discomfort when he voids.  Started on Pyridium.    Assessment/Plan: Principal Problem:   Acute pyelonephritis Active Problems:   Long-term use of immunosuppressant medication   Chronic kidney disease, stage III (moderate) (HCC)   Liver transplant status (HCC)   Pulmonary nodule   UTI (urinary tract infection)   Acute R pyelonephritis with lower urinary tract symptoms Symptoms suggestive of right pyelonephritis associated with leukocytosis and pyuria Continue Rocephin empirically Urine culture grew E. coli awaiting sensitivities Follow sensitivities Leukocytosis is trending down and resolving  Started on Pyridium due to persistent discomfort with voiding  Uncontrolled hypertension Continue to hold off losartan due to elevated creatinine Started on amlodipine 5 mg daily Continue to monitor vital signs  Long-term use of immunosuppressant for liver transplant Continue CellCept and mycophenolate Prednisone has been increased to 15 mg p.o. daily from 5 mg daily (home dose)  CKD 3 Appears to be at his baseline Creatinine 1.5 with GFR of 49 Avoid  nephrotoxic agents/dehydration/hypotension Repeat BMP in the morning  Pulmonary nodule, incidentally found Noted on CT renal stone study done on 08/04/2018 Revealing 4 mm pulmonary nodule near the right major fissure Repeat imaging in 6 to 12 months  BPH Continue tamsulosin Monitor urine output  Bony abnormality medial ankle with history of gout X-ray left ankle shows irregularity medial malleolus appears posttraumatic Denies tenderness on palpation Elevated uric acid 9.7 On colchicine as needed  Hyperuricemia Uric acid 9.7 Denies any symptoms of gout Also receiving p.o. steroids Only on colchicine as needed    Risks: High risk for the compensation due to acute pyelonephritis in the setting of immunosuppression, multiple comorbidities, and advanced age.  Patient will require at least 2 midnights for further evaluation, and administration of IV antibiotics.  We will continue to follow urine culture with sensitivities.   DVT prophylaxis:  Subcu Lovenox daily Code Status: Full Family Communication: No family in room Disposition Plan:  Home possibly in 1 to 2 days when hemodynamically stable. Consults called: None   Objective: Vitals:   08/06/18 0805 08/06/18 0845 08/06/18 1015 08/06/18 1329  BP: (!) 171/100 (!) 149/101 (!) 162/102 (!) 159/98  Pulse: 60 61 61 63  Resp: 18   18  Temp: 98 F (36.7 C)  98 F (36.7 C) 97.9 F (36.6 C)  TempSrc: Oral  Oral Oral  SpO2: 96%  96% 94%  Weight:      Height:        Intake/Output Summary (Last 24 hours) at 08/06/2018 1753 Last data filed at 08/06/2018 1658 Gross per 24 hour  Intake 1375.04 ml  Output 1450 ml  Net -74.96 ml   [employer]   08/04/18  1010  Weight: 117.9 kg    Exam:  . General: 63 y.o. year-old male well-developed well-nourished no acute distress.  Alert and oriented x3. . Cardiovascular: Regular rate and rhythm with no rubs or gallops.  No JVD or thyromegaly.   Marland Kitchen Respiratory: Clear to Auscultation  with No Wheezes or Rales.  Good inspiratory effort. . Abdomen: Soft nontender nondistended with normal bowel sounds x4 quadrants.  Mild tenderness to right flank. . Musculoskeletal: No lower extremity edema. 2/4 pulses in all 4 extremities.  Medial part of left ankle with bony extrusion. Marland Kitchen Psychiatry: Mood is appropriate for condition and setting   Data Reviewed: CBC: Recent Labs  Lab 08/04/18 1130 08/05/18 0430 08/06/18 0807  WBC 18.6* 17.3* 10.4  NEUTROABS 15.6*  --  8.3*  HGB 14.8 14.6 14.2  HCT 44.3 45.7 44.0  MCV 97.8 98.3 101.4*  PLT 147* 129* 465*   Basic Metabolic Panel: Recent Labs  Lab 08/04/18 1130 08/05/18 0430 08/06/18 0807  NA 139 139 139  K 3.0* 4.0 3.3*  CL 108 108 108  CO2 22 23 27   GLUCOSE 114* 123* 121*  BUN 18 22* 21*  CREATININE 1.71* 1.65* 1.53*  CALCIUM 8.4* 8.6* 8.4*  MG  --   --  1.9   GFR: Estimated Creatinine Clearance: 69.1 mL/min (A) (by C-G formula based on SCr of 1.53 mg/dL (H)). Liver Function Tests: Recent Labs  Lab 08/04/18 1130 08/06/18 0807  AST 21 14*  ALT 31 25  ALKPHOS 44 49  BILITOT 2.7* 0.9  PROT 6.3* 6.3*  ALBUMIN 3.6 3.3*   No results for input(s): LIPASE, AMYLASE in the last 168 hours. No results for input(s): AMMONIA in the last 168 hours. Coagulation Profile: No results for input(s): INR, PROTIME in the last 168 hours. Cardiac Enzymes: No results for input(s): CKTOTAL, CKMB, CKMBINDEX, TROPONINI in the last 168 hours. BNP (last 3 results) No results for input(s): PROBNP in the last 8760 hours. HbA1C: No results for input(s): HGBA1C in the last 72 hours. CBG: No results for input(s): GLUCAP in the last 168 hours. Lipid Profile: No results for input(s): CHOL, HDL, LDLCALC, TRIG, CHOLHDL, LDLDIRECT in the last 72 hours. Thyroid Function Tests: No results for input(s): TSH, T4TOTAL, FREET4, T3FREE, THYROIDAB in the last 72 hours. Anemia Panel: No results for input(s): VITAMINB12, FOLATE, FERRITIN, TIBC, IRON,  RETICCTPCT in the last 72 hours. Urine analysis:    Component Value Date/Time   COLORURINE AMBER (A) 08/04/2018 1231   APPEARANCEUR CLOUDY (A) 08/04/2018 1231   APPEARANCEUR Clear 11/30/2013 1959   LABSPEC 1.021 08/04/2018 1231   LABSPEC 1.017 11/30/2013 1959   PHURINE 5.0 08/04/2018 1231   GLUCOSEU NEGATIVE 08/04/2018 1231   GLUCOSEU Negative 11/30/2013 1959   HGBUR MODERATE (A) 08/04/2018 1231   HGBUR negative 05/25/2010 1153   BILIRUBINUR NEGATIVE 08/04/2018 1231   BILIRUBINUR Negative 11/30/2013 1959   KETONESUR NEGATIVE 08/04/2018 1231   PROTEINUR 100 (A) 08/04/2018 1231   UROBILINOGEN 0.2 08/25/2012 2256   NITRITE POSITIVE (A) 08/04/2018 1231   LEUKOCYTESUR MODERATE (A) 08/04/2018 1231   LEUKOCYTESUR Negative 11/30/2013 1959   Sepsis Labs: @LABRCNTIP (procalcitonin:4,lacticidven:4)  ) Recent Results (from the past 240 hour(s))  Urine culture     Status: Abnormal (Preliminary result)   Collection Time: 08/04/18 12:31 PM  Result Value Ref Range Status   Specimen Description   Final    URINE, RANDOM Performed at [facility], [place] [address], [place], [place] [address]    Special Requests  Final    NONE Performed at [facility], [place] [address], [place], [place] [address]    Culture >=100,000 COLONIES/mL ESCHERICHIA COLI (A)  Final   Report Status PENDING  Incomplete      Studies: Dg Ankle Left Port  Result Date: 08/06/2018 CLINICAL DATA:  Bony abnormality medial ankle EXAM: PORTABLE LEFT ANKLE - 2 VIEW COMPARISON:  None. FINDINGS: Irregularity of the medial malleolus with adjacent soft tissue calcification. This appears to be posttraumatic and is likely due to chronic fracture and healing. Overlying mild soft tissue swelling. Ankle joint otherwise normal.  No acute fracture. Calcaneal spurring. IMPRESSION: Irregularity medial malleolus appears posttraumatic. No acute abnormality. Electronically Signed   By: Franchot Gallo M.D.    On: 08/06/2018 16:19    Scheduled Meds: . amLODipine  5 mg Oral Daily  . enoxaparin (LOVENOX) injection  40 mg Subcutaneous Daily  . mycophenolate  500 mg Oral BID  . pantoprazole  40 mg Oral BID  . phenazopyridine  200 mg Oral TID WC  . predniSONE  15 mg Oral Q breakfast  . tacrolimus  2 mg Oral Daily   And  . tacrolimus  1 mg Oral QHS  . tamsulosin  0.4 mg Oral Daily    Continuous Infusions: . sodium chloride 10 mL/hr at 08/05/18 1055  . cefTRIAXone (ROCEPHIN)  IV 1 g (08/06/18 1003)     LOS: 1 day     Kayleen Memos, MD [facility] Pager [phone]  If 7PM-7AM, please contact night-coverage [email or web address] Password [facility] 08/06/2018, 5:53 PM

## 2018-08-07 LAB — URINE CULTURE: Culture: >=100000 — AB

## 2018-08-07 LAB — BASIC METABOLIC PANEL
Anion gap: 8 (ref 5–15)
BUN: 19 mg/dL (ref 6–20)
CALCIUM: 8.9 mg/dL (ref 8.9–10.3)
CO2: 25 mmol/L (ref 22–32)
Chloride: 107 mmol/L (ref 98–111)
Creatinine, Ser: 1.31 mg/dL — ABNORMAL HIGH (ref 0.61–1.24)
GFR calc Af Amer: >60 mL/min (ref >60)
GFR calc non Af Amer: 59 mL/min — ABNORMAL LOW (ref >60)
Glucose, Bld: 108 mg/dL — ABNORMAL HIGH (ref 70–99)
Potassium: 3.8 mmol/L (ref 3.5–5.1)
Sodium: 140 mmol/L (ref 135–145)

## 2018-08-07 MED ORDER — CEPHALEXIN 500 MG PO CAPS
500.0000 mg | ORAL_CAPSULE | Freq: Four times a day (QID) | ORAL | Status: DC
  Administered 2018-08-08 (×2): 500 mg via ORAL
  Filled 2018-08-07 (×2): qty 1

## 2018-08-07 MED ORDER — IBUPROFEN 200 MG PO TABS
200.0000 mg | ORAL_TABLET | Freq: Three times a day (TID) | ORAL | Status: DC | PRN
  Administered 2018-08-07 – 2018-08-08 (×3): 200 mg via ORAL
  Filled 2018-08-07 (×3): qty 1

## 2018-08-07 MED ORDER — AMLODIPINE BESYLATE 10 MG PO TABS
10.0000 mg | ORAL_TABLET | Freq: Every day | ORAL | Status: DC
  Administered 2018-08-07 – 2018-08-08 (×2): 10 mg via ORAL
  Filled 2018-08-07 (×3): qty 1

## 2018-08-07 MED ORDER — HYDRALAZINE HCL 10 MG PO TABS
10.0000 mg | ORAL_TABLET | Freq: Three times a day (TID) | ORAL | Status: DC
  Administered 2018-08-07 – 2018-08-08 (×4): 10 mg via ORAL
  Filled 2018-08-07 (×4): qty 1

## 2018-08-07 NOTE — Progress Notes (Signed)
PROGRESS NOTE  Devin White XKG:[id number] DOB: [birth year] DOA: 08/04/2018 PCP: Venia Carbon, MD  HPI/Recap of past 24 hours: Devin White is a 62 y.o. male with medical history significant of liver transplant for cirrhosis, on chronic immunosuppressive therapy including 5mg  prednisone daily.  Also has h/o lumbar fusion in past.  Patient presents to the ED with c/o back pain, dysuria, urinary urgency, chills and subjective fever.  Also has R sided low back pain with radiation to R hip and down R leg posterior aspect.  Pain is severe, not relieved by Ibuprofen.  UA confirms UTI findings, CT abd/pelvis shows no evidence of hydronephrosis or other suspicious findings.    IV antibiotics empirically.  Urine culture pending.  08/06/2018: Patient seen and examined at bedside.  No acute events overnight.  Reports significant discomfort when he voids.  Started on Pyridium.  08/07/18: Lower urinary tract symptoms improving on Pyridium.  Headaches this morning improved with ibuprofen.    Assessment/Plan: Principal Problem:   Acute pyelonephritis Active Problems:   Long-term use of immunosuppressant medication   Chronic kidney disease, stage III (moderate) (HCC)   Liver transplant status (HCC)   Pulmonary nodule   UTI (urinary tract infection)   Acute R pyelonephritis with lower urinary tract symptoms Symptoms suggestive of right pyelonephritis associated with leukocytosis and pyuria Continue Rocephin empirically Urine culture grew E. coli awaiting sensitivities Follow sensitivities Leukocytosis is trending down and resolving  Continue on Pyridium x1 day  Uncontrolled hypertension Continue to hold off losartan due to elevated creatinine Increased amlodipine to 10 mg daily Started hydralazine p.o. 10 mg 3 times daily  Continue to monitor vital signs  Long-term use of immunosuppressant for liver transplant Continue CellCept and mycophenolate Prednisone has been increased to 15  mg p.o. daily from 5 mg daily (home dose)  AKI on CKD 3  Baseline creatinine appears to be 1.31 with GFR 59 Presented with creatinine of 1.65 Continue to avoid nephrotoxic agents/dehydration Continue to monitor urine output Net I&O -1.3 L  Pulmonary nodule, incidentally found Noted on CT renal stone study done on 08/04/2018 Revealing 4 mm pulmonary nodule near the right major fissure Repeat imaging in 6 to 12 months  BPH with LUTS Continue tamsulosin Urine output Pyridium for lower urinary tract symptoms  Bony abnormality medial ankle with history of gout X-ray left ankle shows irregularity medial malleolus appears posttraumatic Denies tenderness on palpation Elevated uric acid 9.7 On colchicine as needed  Hyperuricemia Uric acid 9.7 Denies any symptoms of gout Also receiving p.o. steroids Only on colchicine as needed      DVT prophylaxis:  Subcu Lovenox daily Code Status: Full Family Communication: None at bedside Disposition Plan:  Home possibly tomorrow 08/08/2018 Consults called: None   Objective: Vitals:   08/07/18 0502 08/07/18 1200 08/07/18 1304 08/07/18 1415  BP: (!) 167/92  (!) 160/105 (!) 160/93  Pulse: 64 68 72 70  Resp:   18 18  Temp:   98.3 F (36.8 C) 97.7 F (36.5 C)  TempSrc:   Oral Oral  SpO2: 95% 96% 97% 93%  Weight:      Height:        Intake/Output Summary (Last 24 hours) at 08/07/2018 1529 Last data filed at 08/07/2018 1425 Gross per 24 hour  Intake 1381 ml  Output 3400 ml  Net -2019 ml   Filed Weights   08/04/18 1010  Weight: 117.9 kg    Exam:  . General: 63 y.o. year-old male well-developed well-nourished  in no acute distress.  Alert oriented x3. . Cardiovascular: Regular rate and rhythm with no rubs or gallops.  No JVD or thyromegaly . Respiratory: Clear to auscultation with no wheezes or rales.  Good inspiratory effort. . Abdomen: Soft nontender nondistended with normal bowel sounds x4 quadrants.  . Musculoskeletal: No  lower extremity edema. 2/4 pulses in all 4 extremities.  Medial part of left ankle with bony extrusion. Marland Kitchen Psychiatry: Mood is appropriate for condition and setting   Data Reviewed: CBC: Recent Labs  Lab 08/04/18 1130 08/05/18 0430 08/06/18 0807  WBC 18.6* 17.3* 10.4  NEUTROABS 15.6*  --  8.3*  HGB 14.8 14.6 14.2  HCT 44.3 45.7 44.0  MCV 97.8 98.3 101.4*  PLT 147* 129* 413*   Basic Metabolic Panel: Recent Labs  Lab 08/04/18 1130 08/05/18 0430 08/06/18 0807 08/07/18 0440  NA 139 139 139 140  K 3.0* 4.0 3.3* 3.8  CL 108 108 108 107  CO2 22 23 27 25   GLUCOSE 114* 123* 121* 108*  BUN 18 22* 21* 19  CREATININE 1.71* 1.65* 1.53* 1.31*  CALCIUM 8.4* 8.6* 8.4* 8.9  MG  --   --  1.9  --    GFR: Estimated Creatinine Clearance: 80.7 mL/min (A) (by C-G formula based on SCr of 1.31 mg/dL (H)). Liver Function Tests: Recent Labs  Lab 08/04/18 1130 08/06/18 0807  AST 21 14*  ALT 31 25  ALKPHOS 44 49  BILITOT 2.7* 0.9  PROT 6.3* 6.3*  ALBUMIN 3.6 3.3*   No results for input(s): LIPASE, AMYLASE in the last 168 hours. No results for input(s): AMMONIA in the last 168 hours. Coagulation Profile: No results for input(s): INR, PROTIME in the last 168 hours. Cardiac Enzymes: No results for input(s): CKTOTAL, CKMB, CKMBINDEX, TROPONINI in the last 168 hours. BNP (last 3 results) No results for input(s): PROBNP in the last 8760 hours. HbA1C: No results for input(s): HGBA1C in the last 72 hours. CBG: No results for input(s): GLUCAP in the last 168 hours. Lipid Profile: No results for input(s): CHOL, HDL, LDLCALC, TRIG, CHOLHDL, LDLDIRECT in the last 72 hours. Thyroid Function Tests: No results for input(s): TSH, T4TOTAL, FREET4, T3FREE, THYROIDAB in the last 72 hours. Anemia Panel: No results for input(s): VITAMINB12, FOLATE, FERRITIN, TIBC, IRON, RETICCTPCT in the last 72 hours. Urine analysis:    Component Value Date/Time   COLORURINE AMBER (A) 08/04/2018 1231    APPEARANCEUR CLOUDY (A) 08/04/2018 1231   APPEARANCEUR Clear 11/30/2013 1959   LABSPEC 1.021 08/04/2018 1231   LABSPEC 1.017 11/30/2013 1959   PHURINE 5.0 08/04/2018 1231   GLUCOSEU NEGATIVE 08/04/2018 1231   GLUCOSEU Negative 11/30/2013 1959   HGBUR MODERATE (A) 08/04/2018 1231   HGBUR negative 05/25/2010 1153   BILIRUBINUR NEGATIVE 08/04/2018 1231   BILIRUBINUR Negative 11/30/2013 1959   KETONESUR NEGATIVE 08/04/2018 1231   PROTEINUR 100 (A) 08/04/2018 1231   UROBILINOGEN 0.2 08/25/2012 2256   NITRITE POSITIVE (A) 08/04/2018 1231   LEUKOCYTESUR MODERATE (A) 08/04/2018 1231   LEUKOCYTESUR Negative 11/30/2013 1959   Sepsis Labs: @LABRCNTIP (procalcitonin:4,lacticidven:4)  ) Recent Results (from the past 240 hour(s))  Urine culture     Status: Abnormal   Collection Time: 08/04/18 12:31 PM  Result Value Ref Range Status   Specimen Description   Final    URINE, RANDOM Performed at [facility], [place] [address], [place], [place] [address]    Special Requests   Final    NONE Performed at [facility], [address] [place], [place] [address]    Culture >=100,000 COLONIES/mL ESCHERICHIA COLI (A)  Final   Report Status 08/07/2018 FINAL  Final   Organism ID, Bacteria ESCHERICHIA COLI (A)  Final      Susceptibility   Escherichia coli - MIC*    AMPICILLIN >=32 RESISTANT Resistant     CEFAZOLIN <=4 SENSITIVE Sensitive     CEFTRIAXONE <=1 SENSITIVE Sensitive     CIPROFLOXACIN <=0.25 SENSITIVE Sensitive     GENTAMICIN <=1 SENSITIVE Sensitive     IMIPENEM <=0.25 SENSITIVE Sensitive     NITROFURANTOIN <=16 SENSITIVE Sensitive     TRIMETH/SULFA >=320 RESISTANT Resistant     AMPICILLIN/SULBACTAM >=32 RESISTANT Resistant     PIP/TAZO <=4 SENSITIVE Sensitive     Extended ESBL NEGATIVE Sensitive     * >=100,000 COLONIES/mL ESCHERICHIA COLI      Studies: Dg Ankle Left Port  Result Date: 08/06/2018 CLINICAL DATA:  Bony abnormality medial ankle  EXAM: PORTABLE LEFT ANKLE - 2 VIEW COMPARISON:  None. FINDINGS: Irregularity of the medial malleolus with adjacent soft tissue calcification. This appears to be posttraumatic and is likely due to chronic fracture and healing. Overlying mild soft tissue swelling. Ankle joint otherwise normal.  No acute fracture. Calcaneal spurring. IMPRESSION: Irregularity medial malleolus appears posttraumatic. No acute abnormality. Electronically Signed   By: Franchot Gallo M.D.   On: 08/06/2018 16:19    Scheduled Meds: . amLODipine  10 mg Oral Daily  . [START ON 08/08/2018] cephALEXin  500 mg Oral QID  . enoxaparin (LOVENOX) injection  40 mg Subcutaneous Daily  . hydrALAZINE  10 mg Oral Q8H  . mycophenolate  500 mg Oral BID  . pantoprazole  40 mg Oral BID  . phenazopyridine  200 mg Oral TID WC  . predniSONE  15 mg Oral Q breakfast  . tacrolimus  2 mg Oral Daily   And  . tacrolimus  1 mg Oral QHS  . tamsulosin  0.4 mg Oral Daily    Continuous Infusions: . sodium chloride 10 mL/hr at 08/07/18 0600     LOS: 2 days     Kayleen Memos, MD [facility] Pager [phone]  If 7PM-7AM, please contact night-coverage [email or web address] Password [facility] 08/07/2018, 3:29 PM

## 2018-08-07 NOTE — Care Management Important Message (Signed)
Important Message  Patient Details  Name: DARAN FAVARO MRN: [id number] Date of Birth: May 24, 1956   Medicare Important Message Given:  Yes    Kerin Salen 08/07/2018, 3:38 [place] Message  Patient Details  Name: DANISH RUFFINS MRN: [id number] Date of Birth: 12-24-1955   Medicare Important Message Given:  Yes    Kerin Salen 08/07/2018, 3:38 PM

## 2018-08-07 NOTE — Progress Notes (Signed)
Patient c/o headache.  Dr. [place] Crane notified.  New orders for po pain medication to follow.

## 2018-08-08 MED ORDER — AMLODIPINE BESYLATE 10 MG PO TABS: 10.0000 mg | ORAL_TABLET | Freq: Every day | ORAL | 0 refills | Status: DC

## 2018-08-08 MED ORDER — HYDRALAZINE HCL 10 MG PO TABS: 10.0000 mg | ORAL_TABLET | Freq: Three times a day (TID) | ORAL | 0 refills | Status: DC

## 2018-08-08 MED ORDER — CEPHALEXIN 500 MG PO CAPS: 500.0000 mg | ORAL_CAPSULE | Freq: Four times a day (QID) | ORAL | 0 refills | Status: AC

## 2018-08-08 MED ORDER — PREDNISONE 5 MG PO TABS: 10.0000 mg | ORAL_TABLET | Freq: Every day | ORAL | 0 refills | Status: DC

## 2018-08-08 NOTE — Progress Notes (Signed)
Went over discharge papers and new medications with patient.  All questions answered.  AVS papers given to patient.  Wheeled out via [place], Therapist, sports.

## 2018-08-08 NOTE — Discharge Summary (Signed)
Discharge Summary  Devin White WNI:[id number] DOB: 1956/03/14  PCP: Venia Carbon, MD  Admit date: 08/04/2018 Discharge date: 08/08/2018  Time spent: 35 minutes  Recommendations for Outpatient Follow-up:  1. Follow-up with your primary care provider 2. Follow-up with your transplant team at [facility] 3. Take your medications as prescribed 4. Pulmonary nodule, incidentally found; repeat CT chest within a year.  Discharge Diagnoses:  Active Hospital Problems   Diagnosis Date Noted  . Acute pyelonephritis 08/04/2018  . UTI (urinary tract infection) 08/05/2018  . Pulmonary nodule 08/04/2018  . Liver transplant status ([place]) 10/10/2017  . Chronic kidney disease, stage III (moderate) ([place]) 10/09/2016  . Long-term use of immunosuppressant medication 04/08/2016    Resolved Hospital Problems  No resolved problems to display.    Discharge Condition: Stable  Diet recommendation: Resume previous diet  Vitals:   08/08/18 0607 08/08/18 0807  BP: (!) 152/96 (!) 158/102  Pulse:  (!) 57  Resp:  18  Temp:  97.7 F (36.5 C)  SpO2:  94%    History of present illness:   Devin Haberman Claytonis a 63 y.o.malewith medical history significant ofliver transplant for cirrhosis, on chronic immunosuppressive therapy including 5mg  prednisone daily. Also has h/o lumbar fusion in past.  Patient presents to the ED with c/o back pain, dysuria, urinary urgency, chills and subjective fever. Also has R sided low back pain with radiation to R hip and down R leg posterior aspect. Pain is severe, not relieved by Ibuprofen.  UA confirms UTI findings, CT abd/pelvis shows no evidence of hydronephrosis or other suspicious findings. Admitted for right pyelonephritis.  Urine culture positive for E. coli with resistance to ampicillin/sulbactam and trimethoprim/sulfa.  Sensitive to Rocephin and Keflex.  Hospital course complicated by lower urinary tract symptoms improved on Pyridium.  Had intermittent  headaches which resolved with NSAIDs.  08/08/2018: Patient seen and examined at his bedside.  No acute events overnight.  No new complaints.  On the day of discharge, the patient was hemodynamically stable.  He will need to follow-up with his primary care provider and liver transplant team at [facility] posthospitalization.  He will also need to take his medications as prescribed.     Hospital Course:  Principal Problem:   Acute pyelonephritis Active Problems:   Long-term use of immunosuppressant medication   Chronic kidney disease, stage III (moderate) (H[facility])   Liver transplant status ([place])   Pulmonary nodule   UTI (urinary tract infection)  Resolving acute R pyelonephritis with lower urinary tract symptoms Symptoms, right flank pain, suggestive of right pyelonephritis associated with leukocytosis and pyuria Continue Rocephin empirically Urine culture grew E. coli with sensitivities as stated above Received Pyridium with improvement of lower urinary tract symptoms  Uncontrolled hypertension Continue to hold off losartan due to elevated creatinine Continue amlodipine to 10 mg daily Continue hydralazine p.o. 10 mg 3 times daily  Follow-up with your PCP  Long-term use of immunosuppressant for liver transplant Continue CellCept and mycophenolate Prednisone has been increased to 10 mg p.o. daily from 5 mg daily (home dose). Follow up with [facility] transplant team  Resolving AKI on CKD 3  Baseline creatinine appears to be 1.31 with GFR 59 Presented with creatinine of 1.71 Follow-up with your PCP  Pulmonary nodule, incidentally found Noted on CT renal stone study done on 08/04/2018 Revealing 4 mm pulmonary nodule near the right major fissure Repeat imaging in 6 to 12 months Follow-up with your PCP  BPH with LUTS Continue tamsulosin Good  urine output.  Good net -1.1 L since admission Received Pyridium for lower urinary tract symptoms  Bony abnormality medial  ankle with history of gout X-ray left ankle shows irregularity medial malleolus appears posttraumatic Denies tenderness on palpation Elevated uric acid 9.7 On colchicine as needed Follow-up with your PCP  Hyperuricemia Uric acid 9.7 Denies any symptoms of gout Also receiving p.o. steroids Only on colchicine as needed       Code Status:Full   Discharge Exam: BP (!) 158/102 (BP Location: Right Arm)   Pulse (!) 57   Temp 97.7 F (36.5 C) (Oral)   Resp 18   Ht 6\' 1"  (1.854 m)   Wt 117.9 kg   SpO2 94%   BMI 34.30 kg/m  . General: 63 y.o. year-old male well developed well nourished in no acute distress.  Alert and oriented x3. . Cardiovascular: Regular rate and rhythm with no rubs or gallops.  No thyromegaly or JVD noted.   Marland Kitchen Respiratory: Clear to auscultation with no wheezes or rales. Good inspiratory effort. . Abdomen: Soft nontender nondistended with normal bowel sounds x4 quadrants. . Musculoskeletal: No lower extremity edema. 2/4 pulses in all 4 extremities. . Skin: No ulcerative lesions noted or rashes, . Psychiatry: Mood is appropriate for condition and setting  Discharge Instructions You were cared for by a hospitalist during your hospital stay. If you have any questions about your discharge medications or the care you received while you were in the hospital after you are discharged, you can call the unit and asked to speak with the hospitalist on call if the hospitalist that took care of you is not available. Once you are discharged, your primary care physician will handle any further medical issues. Please note that NO REFILLS for any discharge medications will be authorized once you are discharged, as it is imperative that you return to your primary care physician (or establish a relationship with a primary care physician if you do not have one) for your aftercare needs so that they can reassess your need for medications and monitor your lab values.   Allergies  as of 08/08/2018   No Known Allergies     Medication List    STOP taking these medications   losartan 50 MG tablet Commonly known as:  COZAAR     TAKE these medications   amLODipine 10 MG tablet Commonly known as:  NORVASC Take 1 tablet (10 mg total) by mouth daily. Start taking on:  August 09, 2018   calcium carbonate 500 MG chewable tablet Commonly known as:  TUMS Chew 1 tablet (200 mg of elemental calcium total) by mouth 2 (two) times daily as needed for indigestion or heartburn.   cephALEXin 500 MG capsule Commonly known as:  KEFLEX Take 1 capsule (500 mg total) by mouth 4 (four) times daily for 6 days.   colchicine 0.6 MG tablet TAKE 1 TABLET BY MOUTH 2 (TWO) TIMES DAILY AS NEEDED (FOR GOUT). What changed:  See the new instructions.   hydrALAZINE 10 MG tablet Commonly known as:  APRESOLINE Take 1 tablet (10 mg total) by mouth every 8 (eight) hours.   mycophenolate 250 MG capsule Commonly known as:  CELLCEPT Take 500 mg by mouth 2 (two) times daily.   nitroGLYCERIN 0.4 MG SL tablet Commonly known as:  NITROSTAT Place 1 tablet (0.4 mg total) under the tongue every 5 (five) minutes as needed for chest pain.   omeprazole 20 MG capsule Commonly known as:  PRILOSEC TAKE ONE (1)  CAPSULE BY MOUTH 2 TIMES DAILY   predniSONE 5 MG tablet Commonly known as:  DELTASONE Take 2 tablets (10 mg total) by mouth daily with breakfast. Start taking on:  August 09, 2018 What changed:  how much to take   tacrolimus 1 MG capsule Commonly known as:  PROGRAF Take 1 mg by mouth See admin instructions. 2 mg in the am and  1 mg at night   tamsulosin 0.4 MG Caps capsule Commonly known as:  FLOMAX TAKE ONE CAPSULE BY MOUTH DAILY      No Known Allergies Follow-up Information    Venia Carbon, MD. Call in 1 day(s).   Specialties:  Internal Medicine, Pediatrics Why:  Please call for a post hospital follow-up appointment Contact information: [place] [id number] [phone]            The results of significant diagnostics from this hospitalization (including imaging, microbiology, ancillary and laboratory) are listed below for reference.    Significant Diagnostic Studies: Mr Lumbar Spine W Wo Contrast  Result Date: 08/04/2018 CLINICAL DATA:  63 y/o M; back pain, cauda equina syndrome suspected. EXAM: MRI LUMBAR SPINE WITHOUT AND WITH CONTRAST TECHNIQUE: Multiplanar and multiecho pulse sequences of the lumbar spine were obtained without and with intravenous contrast. CONTRAST:  10 cc Gadavist COMPARISON:  08/04/2018 CT abdomen and pelvis. 10/11/2008 MRI of the lumbar spine. FINDINGS: Segmentation:  Standard. Alignment:  Physiologic. Vertebrae: L4-S1 PLIF. Susceptibility artifact from the fusion hardware partially obscures the vertebral bodies, spinal canal, and neural foramen throughout the levels of fusion. No findings of fracture or discitis. No suspicious bone lesion. No abnormal enhancement. Conus medullaris and cauda equina: Conus extends to the L1 level. Conus and cauda equina appear normal. No abnormal enhancement. Paraspinal and other soft tissues: Chronic for surgical changes within the subcutaneous fat and paraspinal muscles the levels of lumbar fusion. Partially visualized left kidney interpolar cyst. Disc levels: L1-2: No significant disc displacement, foraminal stenosis, or canal stenosis. L2-3: Mild disc bulge. No significant foraminal or spinal canal stenosis. L3-4: Mild disc bulge and facet hypertrophy. No significant foraminal or spinal canal stenosis. L4-5: The level is partially obscured by susceptibility artifact from fusion hardware. No spinal canal stenosis. Neural foramen appear patent. L5-S1: The level is partially obscured by susceptibility artifact from fusion hardware. No spinal canal stenosis. Neural foramen appear patent. IMPRESSION: 1. L4-S1 PLIF chronic postsurgical changes. No acute osseous abnormality  or malalignment. 2. Mild lumbar spondylosis. No significant foraminal or spinal canal stenosis. No findings of cauda equina syndrome. Electronically Signed   By: Kristine Garbe M.D.   On: 08/04/2018 18:06   Dg Ankle Left Port  Result Date: 08/06/2018 CLINICAL DATA:  Bony abnormality medial ankle EXAM: PORTABLE LEFT ANKLE - 2 VIEW COMPARISON:  None. FINDINGS: Irregularity of the medial malleolus with adjacent soft tissue calcification. This appears to be posttraumatic and is likely due to chronic fracture and healing. Overlying mild soft tissue swelling. Ankle joint otherwise normal.  No acute fracture. Calcaneal spurring. IMPRESSION: Irregularity medial malleolus appears posttraumatic. No acute abnormality. Electronically Signed   By: Franchot Gallo M.D.   On: 08/06/2018 16:19   Ct Renal Stone Study  Result Date: 08/04/2018 CLINICAL DATA:  63 year old with right flank pain. Stone disease suspected. EXAM: CT ABDOMEN AND PELVIS WITHOUT CONTRAST TECHNIQUE: Multidetector CT imaging of the abdomen and pelvis was performed following the standard protocol without IV contrast. COMPARISON:  10/16/2012 FINDINGS: Lower chest: 4 mm nodule along  the right major fissure on sequence 6, image 3. Atelectasis at both lung bases. No large pleural effusions. Hepatobiliary: Gallbladder has been removed. No acute abnormality to the liver. Pancreas: Unremarkable. No pancreatic ductal dilatation or surrounding inflammatory changes. Spleen: Normal in size without focal abnormality. Adrenals/Urinary Tract: Stable low-density nodule involving the right adrenal gland that measures roughly 1.1 cm. Findings are suggestive for an adenoma. Question a small myelolipoma in the right adrenal gland. Left adrenal gland is unremarkable. Punctate calcification in the right kidney upper pole. 2 mm stone in the anterior mid right kidney. 2.5 cm low-density structure in medial right kidney is suggestive for a cyst. Negative for right  hydronephrosis. Urinary bladder is decompressed. 4 mm stone in the mid left kidney. Probable cyst in the medial left kidney. Negative for left hydronephrosis. Question a small cyst in left kidney lower pole. No ureter stones. Stomach/Bowel: Stomach is within normal limits. Appendix appears normal. No evidence of bowel wall thickening, distention, or inflammatory changes. Vascular/Lymphatic: Atherosclerotic calcifications in the aorta and iliac arteries without aneurysm. No lymph node enlargement in the abdomen or pelvis. Reproductive: Seminal vesicles are mildly prominent but stable. Prostate is unremarkable. Other: Negative for free fluid. Small umbilical hernia containing fat. Negative for free air. Musculoskeletal: Pedicle screw and rod fixation at L4, L5 and S1. IMPRESSION: 1. Small bilateral kidney stones.  Negative for hydronephrosis. 2. Probable bilateral renal cysts. 3. Atelectasis at the lung bases. Indeterminate 4 mm nodule near the right major fissure. No follow-up needed if patient is low-risk. Non-contrast chest CT can be considered in 12 months if patient is high-risk. This recommendation follows the consensus statement: Guidelines for Management of Incidental Pulmonary Nodules Detected on CT Images: From the Fleischner Society 2017; Radiology 2017; 284:228-243. 4.  Aortic Atherosclerosis (ICD10-I70.0). Electronically Signed   By: Markus Daft M.D.   On: 08/04/2018 12:39    Microbiology: Recent Results (from the past 240 hour(s))  Urine culture     Status: Abnormal   Collection Time: 08/04/18 12:31 PM  Result Value Ref Range Status   Specimen Description   Final    URINE, RANDOM Performed at [facility] [address] [place], [place] [address]    Special Requests   Final    NONE Performed at [facility], [place] [address], [place], [place] [address]    Culture >=100,000 COLONIES/mL ESCHERICHIA COLI (A)  Final   Report Status 08/07/2018 FINAL  Final    Organism ID, Bacteria ESCHERICHIA COLI (A)  Final      Susceptibility   Escherichia coli - MIC*    AMPICILLIN >=32 RESISTANT Resistant     CEFAZOLIN <=4 SENSITIVE Sensitive     CEFTRIAXONE <=1 SENSITIVE Sensitive     CIPROFLOXACIN <=0.25 SENSITIVE Sensitive     GENTAMICIN <=1 SENSITIVE Sensitive     IMIPENEM <=0.25 SENSITIVE Sensitive     NITROFURANTOIN <=16 SENSITIVE Sensitive     TRIMETH/SULFA >=320 RESISTANT Resistant     AMPICILLIN/SULBACTAM >=32 RESISTANT Resistant     PIP/TAZO <=4 SENSITIVE Sensitive     Extended ESBL NEGATIVE Sensitive     * >=100,000 COLONIES/mL ESCHERICHIA COLI     Labs: Basic Metabolic Panel: Recent Labs  Lab 08/04/18 1130 08/05/18 0430 08/06/18 0807 08/07/18 0440  NA 139 139 139 140  K 3.0* 4.0 3.3* 3.8  CL 108 108 108 107  CO2 22 23 27 25   GLUCOSE 114* 123* 121* 108*  BUN 18 22* 21* 19  CREATININE 1.71*  1.65* 1.53* 1.31*  CALCIUM 8.4* 8.6* 8.4* 8.9  MG  --   --  1.9  --    Liver Function Tests: Recent Labs  Lab 08/04/18 1130 08/06/18 0807  AST 21 14*  ALT 31 25  ALKPHOS 44 49  BILITOT 2.7* 0.9  PROT 6.3* 6.3*  ALBUMIN 3.6 3.3*   No results for input(s): LIPASE, AMYLASE in the last 168 hours. No results for input(s): AMMONIA in the last 168 hours. CBC: Recent Labs  Lab 08/04/18 1130 08/05/18 0430 08/06/18 0807  WBC 18.6* 17.3* 10.4  NEUTROABS 15.6*  --  8.3*  HGB 14.8 14.6 14.2  HCT 44.3 45.7 44.0  MCV 97.8 98.3 101.4*  PLT 147* 129* 136*   Cardiac Enzymes: No results for input(s): CKTOTAL, CKMB, CKMBINDEX, TROPONINI in the last 168 hours. BNP: BNP (last 3 results) No results for input(s): BNP in the last 8760 hours.  ProBNP (last 3 results) No results for input(s): PROBNP in the last 8760 hours.  CBG: No results for input(s): GLUCAP in the last 168 hours.     Signed:  Kayleen Memos, MD [facility] 08/08/2018, 1:48 PM

## 2018-08-08 NOTE — Progress Notes (Signed)
PT Cancellation Note  Patient Details Name: Devin White MRN: [id number] DOB: 09/26/1955   Cancelled Treatment:    Reason Eval/Treat Not Completed: PT screened, no needs identified, will sign off. Pt reports he has been getting up in room by himself and even took a shower yesterday.  Will sign off.   Galen Manila 08/08/2018, 1:07 PM

## 2018-08-10 ENCOUNTER — Telehealth: Payer: Self-pay

## 2018-08-10 NOTE — Telephone Encounter (Signed)
TCM attempt #1 - pt recently discharged from [id number] with admission dx of right pyelonephritis. Completed TCM and scheduled hospital f/u with PCP.   Transitional Care Management Follow-up Telephone Call    Date discharged? 08/08/18  How have you been since you were released from the hospital? [place].   Any patient concerns? Generalized weakness. BP fluctuations since taking new BP medication.    Items Reviewed:  Medications reviewed: Yes  Allergies reviewed: Yes  Dietary changes reviewed: Yes  Referrals reviewed: Yes   Functional Questionnaire:  Independent - I Dependent - D    Activities of Daily Living (ADLs):    Personal hygiene - I Dressing - I Eating - I Maintaining continence - I Transferring - I   Independent Activities of Daily Living (iADLs): Basic communication skills - I Transportation - I Meal preparation  - I Shopping - I Housework - I Managing medications - I  Managing personal finances - I   Confirmed importance and date/time of follow-up visits scheduled YES  Provider Appointment booked with PCP 08/12/18 @ 1230  Confirmed with patient if condition begins to worsen call PCP or go to the ER.  Patient was given the office number and encouraged to call back with question or concerns: YES

## 2018-08-12 ENCOUNTER — Ambulatory Visit (INDEPENDENT_AMBULATORY_CARE_PROVIDER_SITE_OTHER): Payer: Medicare Other | Admitting: Internal Medicine

## 2018-08-12 ENCOUNTER — Encounter: Payer: Self-pay | Admitting: Internal Medicine

## 2018-08-12 VITALS — BP 102/70 | HR 83 | Temp 97.5°F | Ht 73.0 in | Wt 270.0 lb

## 2018-08-12 DIAGNOSIS — N183 Chronic kidney disease, stage 3 unspecified: Secondary | ICD-10-CM

## 2018-08-12 DIAGNOSIS — M109 Gout, unspecified: Secondary | ICD-10-CM | POA: Diagnosis not present

## 2018-08-12 DIAGNOSIS — I10 Essential (primary) hypertension: Secondary | ICD-10-CM

## 2018-08-12 DIAGNOSIS — N10 Acute tubulo-interstitial nephritis: Secondary | ICD-10-CM

## 2018-08-12 NOTE — Assessment & Plan Note (Signed)
Mild worsening in hospital--but fairly stable

## 2018-08-12 NOTE — Assessment & Plan Note (Signed)
Flare in ankle Will continue the higher prednisone (10mg ) till the gout quiets (than back to 5mg  daily)

## 2018-08-12 NOTE — Assessment & Plan Note (Signed)
Better now Has a few more days of the oral antibiotic Other than immunosuppression, not clear why this happened I did ask him to continue the tamsulosin--in case any urinary retention added to this

## 2018-08-12 NOTE — Assessment & Plan Note (Signed)
BP Readings from Last 3 Encounters:  08/12/18 102/70  08/08/18 (!) 158/102  10/10/17 134/84   Typical with pyelo for BP to go way up Now down again Will stop the hydralazine

## 2018-08-12 NOTE — Progress Notes (Signed)
Subjective:    Patient ID: Devin White, male    DOB: [birth year], 63 y.o.   MRN: [id number]  HPI Here for hospital follow up Reviewed hospital course concurrently--as well as discharge summary/labs, etc  Developed chills---awoke at 2AM Constant dribbling and dysuria Right CVA pain and into right groin Called rescue  Diagnosed with UTI/pyelonephritis No evidence of hydronephrosis or obstructing stone Treated with parenteral antibiotics Changed to oral therapy based on the culture  BP running very high when in the hospital Was started on hydralazine---but now BP very low and he has had some orthostatic dizziness  Current Outpatient Medications on File Prior to Visit  Medication Sig Dispense Refill  . amLODipine (NORVASC) 10 MG tablet Take 1 tablet (10 mg total) by mouth daily. 30 tablet 0  . calcium carbonate (TUMS) 500 MG chewable tablet Chew 1 tablet (200 mg of elemental calcium total) by mouth 2 (two) times daily as needed for indigestion or heartburn. 180 tablet 1  . cephALEXin (KEFLEX) 500 MG capsule Take 1 capsule (500 mg total) by mouth 4 (four) times daily for 6 days. 24 capsule 0  . colchicine 0.6 MG tablet TAKE 1 TABLET BY MOUTH 2 (TWO) TIMES DAILY AS NEEDED (FOR GOUT). (Patient taking differently: Take 0.6 mg by mouth daily as needed (gout). ) 30 tablet 2  . hydrALAZINE (APRESOLINE) 10 MG tablet Take 1 tablet (10 mg total) by mouth every 8 (eight) hours. (Patient taking differently: Take 10 mg by mouth every 8 (eight) hours. Pt said he is taking 1 a day) 90 tablet 0  . mycophenolate (CELLCEPT) 250 MG capsule Take 500 mg by mouth 2 (two) times daily.     . nitroGLYCERIN (NITROSTAT) 0.4 MG SL tablet Place 1 tablet (0.4 mg total) under the tongue every 5 (five) minutes as needed for chest pain. 25 tablet 6  . omeprazole (PRILOSEC) 20 MG capsule TAKE ONE (1) CAPSULE BY MOUTH 2 TIMES DAILY 180 capsule 3  . predniSONE (DELTASONE) 5 MG tablet Take 2 tablets (10 mg total) by  mouth daily with breakfast. 30 tablet 0  . tacrolimus (PROGRAF) 1 MG capsule Take 1 mg by mouth See admin instructions. 2 mg in the am and  1 mg at night    . tamsulosin (FLOMAX) 0.4 MG CAPS capsule TAKE ONE CAPSULE BY MOUTH DAILY 90 capsule 3   No current facility-administered medications on file prior to visit.     No Known Allergies  Past Medical History:  Diagnosis Date  . Benign prostatic hypertrophy   . GERD (gastroesophageal reflux disease)   . Gout   . History of blood transfusion    pt has antibodies in his blood since previous transfusions  . History of cirrhosis of liver S/P TRANSPLANT 2009  . History of liver failure S/P TRANSPLANT  . Hypertension   . Left ureteral calculus     Past Surgical History:  Procedure Laterality Date  . bone morrow biopsy    . CHOLECYSTECTOMY  2007  . CYSTO/ LEFT RETROGRADE PYELOGRAM/ LEFT URETERAL STENT PLACEMENT  03-05-2012  DR Tresa Moore [facility])   LEFT URETERAL CALCULI  . CYSTOSCOPY W/ URETERAL STENT PLACEMENT  03/11/2012   Procedure: CYSTOSCOPY WITH STENT REPLACEMENT;  Surgeon: Alexis Frock, MD;  Location: [facility];  Service: Urology;  Laterality: Left;  . HERNIA REPAIR  2008  . LIVER BIOPSY    . LIVER TRANSPLANT  11/24/2007   pt states doing well since liver transplant  . LUMBAR DISC  SURGERY    . LUMBAR FUSION    . removal of fistula    . URETEROSCOPY  03/11/2012   Procedure: URETEROSCOPY;  Surgeon: Alexis Frock, MD;  Location: [facility];  Service: Urology;  Laterality: Left;   STONE MANIPULATION, stone obtained  [place] [facility]    Family History  Problem Relation Age of Onset  . Cancer Mother        colon  . Arthritis Mother   . Vasculitis Mother   . Hypertension Mother   . Cancer Father        colon  . Kidney disease Father   . Arthritis Father   . Arthritis Brother   . Alcohol abuse Maternal Aunt   . Diabetes Maternal Aunt   . Alcohol abuse Maternal Uncle   . Diabetes Paternal Aunt    . Alcohol abuse Maternal Uncle   . Alcohol abuse Maternal Uncle     Social History   Socioeconomic History  . Marital status: Divorced    Spouse name: Not on file  . Number of children: 3  . Years of education: Not on file  . Highest education level: Not on file  Occupational History  . Occupation: disabled    Employer: RETIRED  Social Needs  . Financial resource strain: Not on file  . Food insecurity:    Worry: Not on file    Inability: Not on file  . Transportation needs:    Medical: Not on file    Non-medical: Not on file  Tobacco Use  . Smoking status: Never Smoker  . Smokeless tobacco: Never Used  Substance and Sexual Activity  . Alcohol use: No  . Drug use: No  . Sexual activity: Not on file  Lifestyle  . Physical activity:    Days per week: Not on file    Minutes per session: Not on file  . Stress: Not on file  Relationships  . Social connections:    Talks on phone: Not on file    Gets together: Not on file    Attends religious service: Not on file    Active member of club or organization: Not on file    Attends meetings of clubs or organizations: Not on file    Relationship status: Not on file  . Intimate partner violence:    Fear of current or ex partner: Not on file    Emotionally abused: Not on file    Physically abused: Not on file    Forced sexual activity: Not on file  Other Topics Concern  . Not on file  Social History Narrative   Has living will   Requests daughter [name] as his health care POA   Would accept brief attempt at resuscitation but no prolonged ventilation   No tube feeds if cognitively unaware   Review of Systems Some nausea--no vomiting. Nausea gone now Has had a gout flare---on the colchicine (especially medial right ankle). X-ray looks post traumatic Prednisone was increased to 15mg  daily--now down to 10 Will be going to transplant center for blood work tomorrow    Objective:   Physical Exam  Constitutional: He  appears well-developed. No distress.  Neck: No thyromegaly present.  Cardiovascular: Normal rate, regular rhythm and normal heart sounds. Exam reveals no gallop.  No murmur heard. Respiratory: Effort normal and breath sounds normal. No respiratory distress. He has no wheezes. He has no rales.  GI: Soft. He exhibits no distension and no mass. There is no rebound and  no guarding.  Very slight suprapubic tenderness  Musculoskeletal:        General: No edema.     Comments: No CVA tenderness---just mild tenderness lower and near spine on right  Lymphadenopathy:    He has no cervical adenopathy.           Assessment & Plan:

## 2018-08-27 DIAGNOSIS — D899 Disorder involving the immune mechanism, unspecified: Secondary | ICD-10-CM | POA: Diagnosis not present

## 2018-08-27 DIAGNOSIS — Z944 Liver transplant status: Secondary | ICD-10-CM | POA: Diagnosis not present

## 2018-10-07 ENCOUNTER — Telehealth: Payer: Self-pay | Admitting: Internal Medicine

## 2018-10-07 NOTE — Telephone Encounter (Signed)
I left a detailed message on patient's voice mail to call back and change appointment to virtual office voice.

## 2018-10-08 ENCOUNTER — Other Ambulatory Visit: Payer: Self-pay | Admitting: Internal Medicine

## 2018-10-12 ENCOUNTER — Encounter: Payer: Medicare Other | Admitting: Internal Medicine

## 2018-10-12 ENCOUNTER — Other Ambulatory Visit: Payer: Self-pay

## 2018-12-01 ENCOUNTER — Encounter: Payer: Self-pay | Admitting: Emergency Medicine

## 2018-12-01 ENCOUNTER — Other Ambulatory Visit: Payer: Self-pay

## 2018-12-01 ENCOUNTER — Inpatient Hospital Stay
Admission: EM | Admit: 2018-12-01 | Discharge: 2018-12-04 | DRG: 690 | Disposition: A | Discharge status: Home / Self Care | Payer: Medicare Other | Attending: Internal Medicine | Admitting: Internal Medicine

## 2018-12-01 ENCOUNTER — Emergency Department: Payer: Medicare Other

## 2018-12-01 ENCOUNTER — Telehealth: Payer: Self-pay

## 2018-12-01 DIAGNOSIS — Z981 Arthrodesis status: Secondary | ICD-10-CM

## 2018-12-01 DIAGNOSIS — Z800 Family history of malignant neoplasm of digestive organs: Secondary | ICD-10-CM

## 2018-12-01 DIAGNOSIS — K219 Gastro-esophageal reflux disease without esophagitis: Secondary | ICD-10-CM | POA: Diagnosis present

## 2018-12-01 DIAGNOSIS — Z7952 Long term (current) use of systemic steroids: Secondary | ICD-10-CM | POA: Diagnosis not present

## 2018-12-01 DIAGNOSIS — R5381 Other malaise: Secondary | ICD-10-CM

## 2018-12-01 DIAGNOSIS — Z8261 Family history of arthritis: Secondary | ICD-10-CM

## 2018-12-01 DIAGNOSIS — Z833 Family history of diabetes mellitus: Secondary | ICD-10-CM | POA: Diagnosis not present

## 2018-12-01 DIAGNOSIS — Z79899 Other long term (current) drug therapy: Secondary | ICD-10-CM

## 2018-12-01 DIAGNOSIS — Z8249 Family history of ischemic heart disease and other diseases of the circulatory system: Secondary | ICD-10-CM | POA: Diagnosis not present

## 2018-12-01 DIAGNOSIS — B9620 Unspecified Escherichia coli [E. coli] as the cause of diseases classified elsewhere: Secondary | ICD-10-CM | POA: Diagnosis present

## 2018-12-01 DIAGNOSIS — N400 Enlarged prostate without lower urinary tract symptoms: Secondary | ICD-10-CM | POA: Diagnosis present

## 2018-12-01 DIAGNOSIS — N3000 Acute cystitis without hematuria: Principal | ICD-10-CM | POA: Diagnosis present

## 2018-12-01 DIAGNOSIS — I10 Essential (primary) hypertension: Secondary | ICD-10-CM | POA: Diagnosis present

## 2018-12-01 DIAGNOSIS — N200 Calculus of kidney: Secondary | ICD-10-CM | POA: Diagnosis present

## 2018-12-01 DIAGNOSIS — Z841 Family history of disorders of kidney and ureter: Secondary | ICD-10-CM

## 2018-12-01 DIAGNOSIS — G4730 Sleep apnea, unspecified: Secondary | ICD-10-CM | POA: Diagnosis present

## 2018-12-01 DIAGNOSIS — Z20828 Contact with and (suspected) exposure to other viral communicable diseases: Secondary | ICD-10-CM | POA: Diagnosis present

## 2018-12-01 DIAGNOSIS — M109 Gout, unspecified: Secondary | ICD-10-CM | POA: Diagnosis present

## 2018-12-01 DIAGNOSIS — Z944 Liver transplant status: Secondary | ICD-10-CM | POA: Diagnosis not present

## 2018-12-01 DIAGNOSIS — R5383 Other fatigue: Secondary | ICD-10-CM

## 2018-12-01 DIAGNOSIS — N281 Cyst of kidney, acquired: Secondary | ICD-10-CM | POA: Diagnosis not present

## 2018-12-01 DIAGNOSIS — N3090 Cystitis, unspecified without hematuria: Secondary | ICD-10-CM | POA: Diagnosis present

## 2018-12-01 LAB — COMPREHENSIVE METABOLIC PANEL
ALT: 35 U/L (ref 0–44)
AST: 22 U/L (ref 15–41)
Albumin: 3.6 g/dL (ref 3.5–5.0)
Alkaline Phosphatase: 45 U/L (ref 38–126)
Anion gap: 12 (ref 5–15)
BUN: 15 mg/dL (ref 6–20)
CO2: 22 mmol/L (ref 22–32)
Calcium: 8.6 mg/dL — ABNORMAL LOW (ref 8.9–10.3)
Chloride: 104 mmol/L (ref 98–111)
Creatinine, Ser: 1.55 mg/dL — ABNORMAL HIGH (ref 0.61–1.24)
GFR calc Af Amer: 56 mL/min — ABNORMAL LOW (ref >60)
GFR calc non Af Amer: 48 mL/min — ABNORMAL LOW (ref >60)
Glucose, Bld: 117 mg/dL — ABNORMAL HIGH (ref 70–99)
Potassium: 3.3 mmol/L — ABNORMAL LOW (ref 3.5–5.1)
Sodium: 138 mmol/L (ref 135–145)
Total Bilirubin: 2.5 mg/dL — ABNORMAL HIGH (ref 0.3–1.2)
Total Protein: 6.3 g/dL — ABNORMAL LOW (ref 6.5–8.1)

## 2018-12-01 LAB — URINALYSIS, COMPLETE (UACMP) WITH MICROSCOPIC
Bilirubin Urine: NEGATIVE
Glucose, UA: NEGATIVE mg/dL
Ketones, ur: NEGATIVE mg/dL
Nitrite: POSITIVE — AB
Protein, ur: NEGATIVE mg/dL
Specific Gravity, Urine: 1.01 (ref 1.005–1.030)
WBC, UA: >50 WBC/hpf — ABNORMAL HIGH (ref 0–5)
pH: 5 (ref 5.0–8.0)

## 2018-12-01 LAB — CBC WITH DIFFERENTIAL/PLATELET
Abs Immature Granulocytes: 0.05 10*3/uL (ref 0.00–0.07)
Basophils Absolute: 0 10*3/uL (ref 0.0–0.1)
Basophils Relative: 0 %
Eosinophils Absolute: 0 10*3/uL (ref 0.0–0.5)
Eosinophils Relative: 0 %
HCT: 45.5 % (ref 39.0–52.0)
Hemoglobin: 15.6 g/dL (ref 13.0–17.0)
Immature Granulocytes: 0 %
Lymphocytes Relative: 7 %
Lymphs Abs: 0.9 10*3/uL (ref 0.7–4.0)
MCH: 32.6 pg (ref 26.0–34.0)
MCHC: 34.3 g/dL (ref 30.0–36.0)
MCV: 95.2 fL (ref 80.0–100.0)
Monocytes Absolute: 1.6 10*3/uL — ABNORMAL HIGH (ref 0.1–1.0)
Monocytes Relative: 13 %
Neutro Abs: 9.9 10*3/uL — ABNORMAL HIGH (ref 1.7–7.7)
Neutrophils Relative %: 80 %
Platelets: 148 10*3/uL — ABNORMAL LOW (ref 150–400)
RBC: 4.78 MIL/uL (ref 4.22–5.81)
RDW: 13.5 % (ref 11.5–15.5)
WBC: 12.4 10*3/uL — ABNORMAL HIGH (ref 4.0–10.5)
nRBC: 0 % (ref 0.0–0.2)

## 2018-12-01 LAB — SARS CORONAVIRUS 2 BY RT PCR (HOSPITAL ORDER, PERFORMED IN ~~LOC~~ HOSPITAL LAB): SARS Coronavirus 2: NEGATIVE

## 2018-12-01 MED ORDER — MYCOPHENOLATE MOFETIL 250 MG PO CAPS
500.0000 mg | ORAL_CAPSULE | Freq: Two times a day (BID) | ORAL | Status: DC
  Administered 2018-12-01 – 2018-12-04 (×7): 500 mg via ORAL
  Filled 2018-12-01 (×8): qty 2

## 2018-12-01 MED ORDER — ONDANSETRON HCL 4 MG/2ML IJ SOLN: 4.0000 mg | Freq: Once | INTRAMUSCULAR | Status: DC

## 2018-12-01 MED ORDER — CALCIUM CARBONATE ANTACID 500 MG PO CHEW: 1.0000 | CHEWABLE_TABLET | Freq: Two times a day (BID) | ORAL | Status: DC | PRN

## 2018-12-01 MED ORDER — ONDANSETRON HCL 4 MG/2ML IJ SOLN: 4.0000 mg | Freq: Four times a day (QID) | INTRAMUSCULAR | Status: DC | PRN

## 2018-12-01 MED ORDER — TAMSULOSIN HCL 0.4 MG PO CAPS
0.4000 mg | ORAL_CAPSULE | Freq: Every day | ORAL | Status: DC
  Administered 2018-12-01 – 2018-12-04 (×4): 0.4 mg via ORAL
  Filled 2018-12-01 (×4): qty 1

## 2018-12-01 MED ORDER — PREDNISONE 10 MG PO TABS
5.0000 mg | ORAL_TABLET | Freq: Every day | ORAL | Status: DC
  Administered 2018-12-01 – 2018-12-04 (×4): 5 mg via ORAL
  Filled 2018-12-01 (×4): qty 1

## 2018-12-01 MED ORDER — SODIUM CHLORIDE 0.9 % IV SOLN
Freq: Once | INTRAVENOUS | Status: AC
  Administered 2018-12-01: 17:00:00 via INTRAVENOUS

## 2018-12-01 MED ORDER — ACETAMINOPHEN 325 MG PO TABS
650.0000 mg | ORAL_TABLET | Freq: Four times a day (QID) | ORAL | Status: DC | PRN
  Administered 2018-12-01 – 2018-12-04 (×4): 650 mg via ORAL
  Filled 2018-12-01 (×4): qty 2

## 2018-12-01 MED ORDER — MORPHINE SULFATE (PF) 4 MG/ML IV SOLN: 4.0000 mg | INTRAVENOUS | Status: DC | PRN

## 2018-12-01 MED ORDER — ONDANSETRON HCL 4 MG/2ML IJ SOLN
4.0000 mg | Freq: Once | INTRAMUSCULAR | Status: AC
  Administered 2018-12-01: 4 mg via INTRAVENOUS
  Filled 2018-12-01: qty 2

## 2018-12-01 MED ORDER — ENOXAPARIN SODIUM 40 MG/0.4ML ~~LOC~~ SOLN
40.0000 mg | SUBCUTANEOUS | Status: DC
  Administered 2018-12-01 – 2018-12-03 (×3): 40 mg via SUBCUTANEOUS
  Filled 2018-12-01 (×3): qty 0.4

## 2018-12-01 MED ORDER — COLCHICINE 0.6 MG PO TABS
0.6000 mg | ORAL_TABLET | Freq: Two times a day (BID) | ORAL | Status: DC | PRN
  Filled 2018-12-01: qty 1

## 2018-12-01 MED ORDER — ACETAMINOPHEN 650 MG RE SUPP: 650.0000 mg | Freq: Four times a day (QID) | RECTAL | Status: DC | PRN

## 2018-12-01 MED ORDER — MORPHINE SULFATE (PF) 4 MG/ML IV SOLN
4.0000 mg | INTRAVENOUS | Status: DC | PRN
  Administered 2018-12-01 – 2018-12-02 (×5): 4 mg via INTRAVENOUS
  Filled 2018-12-01 (×5): qty 1

## 2018-12-01 MED ORDER — SODIUM CHLORIDE 0.9 % IV SOLN
1.0000 g | INTRAVENOUS | Status: DC
  Administered 2018-12-02: 1 g via INTRAVENOUS
  Filled 2018-12-01 (×2): qty 10
  Filled 2018-12-01: qty 1

## 2018-12-01 MED ORDER — TACROLIMUS 0.5 MG PO CAPS
0.5000 mg | ORAL_CAPSULE | Freq: Every day | ORAL | Status: DC
  Administered 2018-12-01 – 2018-12-03 (×3): 0.5 mg via ORAL
  Filled 2018-12-01 (×4): qty 1

## 2018-12-01 MED ORDER — TACROLIMUS 0.5 MG PO CAPS
1.0000 mg | ORAL_CAPSULE | Freq: Every morning | ORAL | Status: DC
  Administered 2018-12-02 – 2018-12-04 (×3): 1 mg via ORAL
  Filled 2018-12-01 (×3): qty 2

## 2018-12-01 MED ORDER — ONDANSETRON HCL 4 MG PO TABS: 4.0000 mg | ORAL_TABLET | Freq: Four times a day (QID) | ORAL | Status: DC | PRN

## 2018-12-01 MED ORDER — PANTOPRAZOLE SODIUM 40 MG PO TBEC
40.0000 mg | DELAYED_RELEASE_TABLET | Freq: Every day | ORAL | Status: DC
  Administered 2018-12-01 – 2018-12-04 (×4): 40 mg via ORAL
  Filled 2018-12-01 (×4): qty 1

## 2018-12-01 MED ORDER — SODIUM CHLORIDE 0.9 % IV SOLN
1.0000 g | Freq: Once | INTRAVENOUS | Status: AC
  Administered 2018-12-01: 1 g via INTRAVENOUS
  Filled 2018-12-01: qty 10

## 2018-12-01 MED ORDER — SODIUM CHLORIDE 0.9 % IV BOLUS
500.0000 mL | Freq: Once | INTRAVENOUS | Status: AC
  Administered 2018-12-01: 500 mL via INTRAVENOUS

## 2018-12-01 MED ORDER — POLYETHYLENE GLYCOL 3350 17 G PO PACK
17.0000 g | PACK | Freq: Every day | ORAL | Status: DC | PRN
  Administered 2018-12-03 – 2018-12-04 (×2): 17 g via ORAL
  Filled 2018-12-01 (×2): qty 1

## 2018-12-01 NOTE — ED Notes (Signed)
ED TO INPATIENT HANDOFF REPORT  ED Nurse Name and Phone #: Stanton Kidney [phone]  S Name/Age/Gender Devin [name] 63 y.o. male Room/Bed: ED02A/ED02A  Code Status   Code Status: Prior  Home/SNF/Other Home Patient oriented to: self, place, time and situation Is this baseline? Yes   Triage Complete: Triage complete  Chief Complaint kidney infection;uti  Triage Note Pt reports pain to right back and flank, urinary urgency and frequency, chills and hot flashes since yesterday. Pt concerned he is getting an infection because he states he was told he had kidney problems.    Allergies No Known Allergies  Level of Care/Admitting Diagnosis ED Disposition    ED Disposition Condition [facility] Area: [place] [[id number]]  Level of Care: Med-Surg [16]  Covid Evaluation: Screening Protocol (No Symptoms)  Diagnosis: Cystitis [147829]  Admitting Physician: Odessa Fleming  Attending Physician: Fritzi Mandes [[id number]  PT Class (Do Not Modify): Observation [104]  PT Acc Code (Do Not Modify): Observation [10022]       B Medical/Surgery History Past Medical History:  Diagnosis Date  . Benign prostatic hypertrophy   . GERD (gastroesophageal reflux disease)   . Gout   . History of blood transfusion    pt has antibodies in his blood since previous transfusions  . History of cirrhosis of liver S/P TRANSPLANT 2009  . History of liver failure S/P TRANSPLANT  . Hypertension   . Left ureteral calculus    Past Surgical History:  Procedure Laterality Date  . bone morrow biopsy    . CHOLECYSTECTOMY  2007  . CYSTO/ LEFT RETROGRADE PYELOGRAM/ LEFT URETERAL STENT PLACEMENT  03-05-2012  DR Tresa Moore [facility])   LEFT URETERAL CALCULI  . CYSTOSCOPY W/ URETERAL STENT PLACEMENT  03/11/2012   Procedure: CYSTOSCOPY WITH STENT REPLACEMENT;  Surgeon: Alexis Frock, MD;  Location: [facility];  Service: Urology;  Laterality: Left;  . HERNIA REPAIR  2008  .  LIVER BIOPSY    . LIVER TRANSPLANT  11/24/2007   pt states doing well since liver transplant  . LUMBAR DISC SURGERY    . LUMBAR FUSION    . removal of fistula    . URETEROSCOPY  03/11/2012   Procedure: URETEROSCOPY;  Surgeon: Alexis Frock, MD;  Location: [facility];  Service: Urology;  Laterality: Left;   STONE MANIPULATION, stone obtained  [place] [facility]     A IV Location/Drains/Wounds Patient Lines/Drains/Airways Status   Active Line/Drains/Airways    Name:   Placement date:   Placement time:   Site:   Days:   Peripheral IV 12/01/18 Right Antecubital   12/01/18    1011    Antecubital   less than 1   Ureteral Drain/Stent Left ureter 6 Fr.   03/11/12    0853    Left ureter   2456   Incision 03/05/12 Perineum   03/05/12    1742     2462   Incision 03/11/12 Perineum Left   03/11/12    0843     2456          Intake/Output Last 24 hours  Intake/Output Summary (Last 24 hours) at 12/01/2018 1423 Last data filed at 12/01/2018 1304 Gross per 24 hour  Intake 500 ml  Output -  Net 500 ml    Labs/Imaging Results for orders placed or performed during the hospital encounter of 12/01/18 (from the past 48 hour(s))  CBC with Differential/Platelet     Status: Abnormal  Collection Time: 12/01/18 10:13 AM  Result Value Ref Range   WBC 12.4 (H) 4.0 - 10.5 K/uL   RBC 4.78 4.22 - 5.81 MIL/uL   Hemoglobin 15.6 13.0 - 17.0 g/dL   HCT 45.5 39.0 - 52.0 %   MCV 95.2 80.0 - 100.0 fL   MCH 32.6 26.0 - 34.0 pg   MCHC 34.3 30.0 - 36.0 g/dL   RDW 13.5 11.5 - 15.5 %   Platelets 148 (L) 150 - 400 K/uL   nRBC 0.0 0.0 - 0.2 %   Neutrophils Relative % 80 %   Neutro Abs 9.9 (H) 1.7 - 7.7 K/uL   Lymphocytes Relative 7 %   Lymphs Abs 0.9 0.7 - 4.0 K/uL   Monocytes Relative 13 %   Monocytes Absolute 1.6 (H) 0.1 - 1.0 K/uL   Eosinophils Relative 0 %   Eosinophils Absolute 0.0 0.0 - 0.5 K/uL   Basophils Relative 0 %   Basophils Absolute 0.0 0.0 - 0.1 K/uL   Immature Granulocytes 0  %   Abs Immature Granulocytes 0.05 0.00 - 0.07 K/uL    Comment: Performed at [facility], [address] [place], [place] [address]  Comprehensive metabolic panel     Status: Abnormal   Collection Time: 12/01/18 11:11 AM  Result Value Ref Range   Sodium 138 135 - 145 mmol/L   Potassium 3.3 (L) 3.5 - 5.1 mmol/L   Chloride 104 98 - 111 mmol/L   CO2 22 22 - 32 mmol/L   Glucose, Bld 117 (H) 70 - 99 mg/dL   BUN 15 6 - 20 mg/dL   Creatinine, Ser 1.55 (H) 0.61 - 1.24 mg/dL   Calcium 8.6 (L) 8.9 - 10.3 mg/dL   Total Protein 6.3 (L) 6.5 - 8.1 g/dL   Albumin 3.6 3.5 - 5.0 g/dL   AST 22 15 - 41 U/L   ALT 35 0 - 44 U/L   Alkaline Phosphatase 45 38 - 126 U/L   Total Bilirubin 2.5 (H) 0.3 - 1.2 mg/dL   GFR calc non Af Amer 48 (L) >60 mL/min   GFR calc Af Amer 56 (L) >60 mL/min   Anion gap 12 5 - 15    Comment: Performed at [facility], [address] [place], [place] [address]  Urinalysis, Complete w Microscopic     Status: Abnormal   Collection Time: 12/01/18  1:02 PM  Result Value Ref Range   Color, Urine YELLOW (A) YELLOW   APPearance HAZY (A) CLEAR   Specific Gravity, Urine 1.010 1.005 - 1.030   pH 5.0 5.0 - 8.0   Glucose, UA NEGATIVE NEGATIVE mg/dL   Hgb urine dipstick MODERATE (A) NEGATIVE   Bilirubin Urine NEGATIVE NEGATIVE   Ketones, ur NEGATIVE NEGATIVE mg/dL   Protein, ur NEGATIVE NEGATIVE mg/dL   Nitrite POSITIVE (A) NEGATIVE   Leukocytes,Ua MODERATE (A) NEGATIVE   RBC / HPF 6-10 0 - 5 RBC/hpf   WBC, UA >50 (H) 0 - 5 WBC/hpf   Bacteria, UA MANY (A) NONE SEEN   Squamous Epithelial / LPF 0-5 0 - 5   Mucus PRESENT     Comment: Performed at [facility], [address] [place], [place] [address]   Ct Renal Stone Study  Result Date: 12/01/2018 CLINICAL DATA:  RIGHT flank and back pain, urinary urgency, frequency, chills and hot flashes since yesterday, history of kidney stones, cirrhosis, hypertension EXAM: CT ABDOMEN AND PELVIS WITHOUT  CONTRAST TECHNIQUE: Multidetector CT imaging of the abdomen  and pelvis was performed following the standard protocol without IV contrast. Sagittal and coronal MPR images reconstructed from axial data set. No oral contrast administered COMPARISON:  08/04/2018 FINDINGS: Lower chest: Minimal dependent bibasilar atelectasis Hepatobiliary: Gallbladder surgically absent. No definite focal hepatic abnormalities. Pancreas: Atrophic pancreas without mass Spleen: Upper normal size.  No focal abnormality. Adrenals/Urinary Tract: Adrenal glands normal appearance. Tiny BILATERAL nonobstructing renal calculi. BILATERAL low-attenuation renal lesions likely cysts, up to 2.3 x 2.7 cm RIGHT and 2.3 x 2.2 cm LEFT. No hydronephrosis or hydroureter. No ureteral calcifications. Bladder unremarkable. Stomach/Bowel: Normal appendix. Mild sigmoid diverticulosis without evidence of diverticulitis. Stomach and bowel loops otherwise normal appearance. Vascular/Lymphatic: Atherosclerotic calcifications aorta and iliac arteries. Aorta normal caliber. No adenopathy. Reproductive: Mild prostatic enlargement. Seminal vesicles prominent without focal abnormality Other: Small umbilical hernia containing fat. No free air. Minimal free pelvic fluid. Minimal dilatation of the LEFT inguinal ring without gross hernia. Musculoskeletal: Prior lumbar fusion L4-S1. No acute osseous findings. IMPRESSION: BILATERAL nonobstructing renal calculi. BILATERAL renal cysts. Small umbilical hernia containing fat. No acute intra-abdominal or intrapelvic abnormalities. Electronically Signed   By: Lavonia Dana M.D.   On: 12/01/2018 11:17    Pending Labs Unresulted Labs (From admission, onward)    Start     Ordered   12/01/18 1402  SARS Coronavirus 2 (CEPHEID - Performed in [place] hospital lab), Hosp Order  (Asymptomatic Patients Labs)  Once,   STAT    Question:  Rule Out  Answer:  Yes   12/01/18 1402   Signed and Held  CBC  (enoxaparin (LOVENOX)    CrCl >/=  30 ml/min)  Once,   R    Comments: Baseline for enoxaparin therapy IF NOT ALREADY DRAWN.  Notify MD if PLT < 100 K.    Signed and Held   Signed and Held  Creatinine, serum  (enoxaparin (LOVENOX)    CrCl >/= 30 ml/min)  Once,   R    Comments: Baseline for enoxaparin therapy IF NOT ALREADY DRAWN.    Signed and Held   Signed and Held  Creatinine, serum  (enoxaparin (LOVENOX)    CrCl >/= 30 ml/min)  Weekly,   R    Comments: while on enoxaparin therapy    Signed and Held          Vitals/Pain Today's Vitals   12/01/18 0953 12/01/18 1021 12/01/18 1110 12/01/18 1312  BP:      Pulse:      Temp:      SpO2:      Weight:      Height:      PainSc: 10-Worst pain ever 10-Worst pain ever 5  7     Isolation Precautions No active isolations  Medications Medications  morphine 4 MG/ML injection 4 mg (4 mg Intravenous Given 12/01/18 1357)  cefTRIAXone (ROCEPHIN) 1 g in sodium chloride 0.9 % 100 mL IVPB (1 g Intravenous New Bag/Given 12/01/18 1358)  cefTRIAXone (ROCEPHIN) 1 g in sodium chloride 0.9 % 100 mL IVPB (has no administration in time range)  sodium chloride 0.9 % bolus 500 mL (0 mLs Intravenous Stopped 12/01/18 1304)  ondansetron (ZOFRAN) injection 4 mg (4 mg Intravenous Given 12/01/18 1033)    Mobility walks Low fall risk   Focused Assessments ed gastrointestinal assessment   R Recommendations: See Admitting Provider Note  Report given to:   Additional Notes:

## 2018-12-01 NOTE — Telephone Encounter (Signed)
On 11/30/18 pt started with lower back pain and lower abd pain on both sides.burning and pain upon urination with frequency and urine has foul odor. Pt had chills last night, no fever, pt gets hot and cold this morning. Pt has SOB & H/A & nausea this AM. Pt said he had liver transplant 11 yrs ago; kidney disease stage III and on 08/12/18 pt had pyelonephritis. Pt said pain level now is 10 and pt said he was going to go to [facility] ED now he is hurting so badly. Pt has someone at home to drive him to ED. FYI to Dr Silvio Pate.

## 2018-12-01 NOTE — ED Notes (Signed)
  Pt transported to ct 

## 2018-12-01 NOTE — ED Notes (Signed)
Devin [name] to call back for report

## 2018-12-01 NOTE — ED Provider Notes (Signed)
[facility] Emergency Department Provider Note    None    (approximate)  I have reviewed the triage vital signs and the nursing notes.   HISTORY  Chief Complaint Urinary Frequency and Flank Pain    HPI Devin [name] is a 63 y.o. male plosive past medical history status post liver transplant presents the ER for dysuria foul odor to his urine and bilateral flank pain.  The symptoms started over the past 2 days.  He is having chills but no measured temperature at home.  Has been treated for pyelonephritis in the past.  Is not currently on any antibiotics.  Does not take anything for the pain.  Does have a history of kidney stones.    Past Medical History:  Diagnosis Date  . Benign prostatic hypertrophy   . GERD (gastroesophageal reflux disease)   . Gout   . History of blood transfusion    pt has antibodies in his blood since previous transfusions  . History of cirrhosis of liver S/P TRANSPLANT 2009  . History of liver failure S/P TRANSPLANT  . Hypertension   . Left ureteral calculus    Family History  Problem Relation Age of Onset  . Cancer Mother        colon  . Arthritis Mother   . Vasculitis Mother   . Hypertension Mother   . Cancer Father        colon  . Kidney disease Father   . Arthritis Father   . Arthritis Brother   . Alcohol abuse Maternal Aunt   . Diabetes Maternal Aunt   . Alcohol abuse Maternal Uncle   . Diabetes Paternal Aunt   . Alcohol abuse Maternal Uncle   . Alcohol abuse Maternal Uncle    Past Surgical History:  Procedure Laterality Date  . bone morrow biopsy    . CHOLECYSTECTOMY  2007  . CYSTO/ LEFT RETROGRADE PYELOGRAM/ LEFT URETERAL STENT PLACEMENT  03-05-2012  DR Tresa Moore [employer])   LEFT URETERAL CALCULI  . CYSTOSCOPY W/ URETERAL STENT PLACEMENT  03/11/2012   Procedure: CYSTOSCOPY WITH STENT REPLACEMENT;  Surgeon: Alexis Frock, MD;  Location: [facility];  Service: Urology;  Laterality: Left;  .  HERNIA REPAIR  2008  . LIVER BIOPSY    . LIVER TRANSPLANT  11/24/2007   pt states doing well since liver transplant  . LUMBAR DISC SURGERY    . LUMBAR FUSION    . removal of fistula    . URETEROSCOPY  03/11/2012   Procedure: URETEROSCOPY;  Surgeon: Alexis Frock, MD;  Location: [facility];  Service: Urology;  Laterality: Left;   STONE MANIPULATION, stone obtained  [facility] MCR   Patient Active Problem List   Diagnosis Date Noted  . UTI (urinary tract infection) 08/05/2018  . Acute pyelonephritis 08/04/2018  . Pulmonary nodule 08/04/2018  . Liver transplant status ([place]) 10/10/2017  . Advance directive discussed with patient 10/10/2017  . Chronic kidney disease, stage III (moderate) ([place]) 10/09/2016  . Mallory-Weiss tear 04/16/2016  . Long-term use of immunosuppressant medication 04/08/2016  . Sleep apnea 10/06/2015  . 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 Hypertension   . Routine general medical examination at a health care facility 04/26/2011  . Chronic liver failure ([place]) 09/24/2010  . GOUT 12/21/2008  . BPH without urinary obstruction 12/21/2008  . ALLERGIC RHINITIS 05/22/2007  . GERD 05/22/2007  . HIATAL HERNIA 05/22/2007  . IRRITABLE BOWEL SYNDROME, HX OF 05/22/2007  .  RENAL CALCULUS, HX OF 05/22/2007      Prior to Admission medications   Medication Sig Start Date End Date Taking? Authorizing Provider  amLODipine (NORVASC) 10 MG tablet Take 1 tablet (10 mg total) by mouth daily. 08/09/18   Kayleen Memos, DO  calcium carbonate (TUMS) 500 MG chewable tablet Chew 1 tablet (200 mg of elemental calcium total) by mouth 2 (two) times daily as needed for indigestion or heartburn. 06/26/16   Tonia Ghent, MD  colchicine 0.6 MG tablet TAKE 1 TABLET BY MOUTH TWICE DAILY AS NEEDED FOR GOUT 10/08/18   Venia Carbon, MD  mycophenolate (CELLCEPT) 250 MG capsule Take 500 mg by mouth 2 (two) times daily.  03/10/15    [provider]  nitroGLYCERIN (NITROSTAT) 0.4 MG SL tablet Place 1 tablet (0.4 mg total) under the tongue every 5 (five) minutes as needed for chest pain. 11/29/15   Wende Bushy, MD  omeprazole (PRILOSEC) 20 MG capsule TAKE ONE (1) CAPSULE BY MOUTH 2 TIMES DAILY 01/12/18   Venia Carbon, MD  predniSONE (DELTASONE) 5 MG tablet Take 2 tablets (10 mg total) by mouth daily with breakfast. 08/09/18   Kayleen Memos, DO  tacrolimus (PROGRAF) 1 MG capsule Take 1 mg by mouth See admin instructions. 2 mg in the am and  1 mg at night    [provider]  tamsulosin (FLOMAX) 0.4 MG CAPS capsule TAKE ONE CAPSULE BY MOUTH DAILY 01/14/18   Venia Carbon, MD    Allergies Patient has no known allergies.    Social History Social History   Tobacco Use  . Smoking status: Never Smoker  . Smokeless tobacco: Never Used  Substance Use Topics  . Alcohol use: No  . Drug use: No    Review of Systems Patient denies headaches, rhinorrhea, blurry vision, numbness, shortness of breath, chest pain, edema, cough, abdominal pain, nausea, vomiting, diarrhea, dysuria, fevers, rashes or hallucinations unless otherwise stated above in HPI. ____________________________________________   PHYSICAL EXAM:  VITAL SIGNS: Vitals:   12/01/18 0950  BP: (!) 148/87  Pulse: 84  Temp: 98 F (36.7 C)  SpO2: 97%    Constitutional: Alert and oriented.  Eyes: Conjunctivae are normal.  Head: Atraumatic. Nose: No congestion/rhinnorhea. Mouth/Throat: Mucous membranes are moist.   Neck: No stridor. Painless ROM.  Cardiovascular: Normal rate, regular rhythm. Grossly normal heart sounds.  Good peripheral circulation. Respiratory: Normal respiratory effort.  No retractions. Lungs CTAB. Gastrointestinal: Soft and nontender. No distention. No abdominal bruits. bilateral CVA tenderness. Genitourinary: deferred Musculoskeletal: No lower extremity tenderness nor edema.  No joint effusions. Neurologic:   Normal speech and language. No gross focal neurologic deficits are appreciated. No facial droop Skin:  Skin is warm, dry and intact. No rash noted. Psychiatric: Mood and affect are normal. Speech and behavior are normal.  ____________________________________________   LABS (all labs ordered are listed, but only abnormal results are displayed)  Results for orders placed or performed during the hospital encounter of 12/01/18 (from the past 24 hour(s))  CBC with Differential/Platelet     Status: Abnormal   Collection Time: 12/01/18 10:13 AM  Result Value Ref Range   WBC 12.4 (H) 4.0 - 10.5 K/uL   RBC 4.78 4.22 - 5.81 MIL/uL   Hemoglobin 15.6 13.0 - 17.0 g/dL   HCT 45.5 39.0 - 52.0 %   MCV 95.2 80.0 - 100.0 fL   MCH 32.6 26.0 - 34.0 pg   MCHC 34.3 30.0 - 36.0 g/dL  RDW 13.5 11.5 - 15.5 %   Platelets 148 (L) 150 - 400 K/uL   nRBC 0.0 0.0 - 0.2 %   Neutrophils Relative % 80 %   Neutro Abs 9.9 (H) 1.7 - 7.7 K/uL   Lymphocytes Relative 7 %   Lymphs Abs 0.9 0.7 - 4.0 K/uL   Monocytes Relative 13 %   Monocytes Absolute 1.6 (H) 0.1 - 1.0 K/uL   Eosinophils Relative 0 %   Eosinophils Absolute 0.0 0.0 - 0.5 K/uL   Basophils Relative 0 %   Basophils Absolute 0.0 0.0 - 0.1 K/uL   Immature Granulocytes 0 %   Abs Immature Granulocytes 0.05 0.00 - 0.07 K/uL   ____________________________________________  EKG  My review and personal interpretation at Time: 10:02   Indication: htn  Rate: 75  Rhythm: sinus Axis: normal Other: normal intervals, no stemi  ____________________________________________  RADIOLOGY  I personally reviewed all radiographic images ordered to evaluate for the above acute complaints and reviewed radiology reports and findings.  These findings were personally discussed with the patient.  Please see medical record for radiology report.  ____________________________________________   PROCEDURES  Procedure(s) performed:  Procedures    Critical Care performed:  no ____________________________________________   INITIAL IMPRESSION / ASSESSMENT AND PLAN / ED COURSE  Pertinent labs & imaging results that were available during my care of the patient were reviewed by me and considered in my medical decision making (see chart for details).   DDX: cystitis, prostatitis, pyelo, stone  Devin [name] is a 63 y.o. who presents to the ED with complex presentation given his history of liver transplant on immunosuppressive therapy presenting with dysuria and flank pain.  CT imaging and blood work ordered for above differential does show evidence of leukocytosis.  CT without evidence of stone or hydro-.  Will obtain urinalysis.  We will give IV fluids as well as IV pain medication.  Clinical Course as of Nov 30 1352  Tue Dec 01, 2018  1351 Urinalysis does show evidence of nitrite positive UTI.  Will give IV Rocephin.   [PR]    Clinical Course User Index [PR] Merlyn Lot, MD    The patient was evaluated in Emergency Department today for the symptoms described in the history of present illness. He/she was evaluated in the context of the global COVID-19 pandemic, which necessitated consideration that the patient might be at risk for infection with the SARS-CoV-2 virus that causes COVID-19. Institutional protocols and algorithms that pertain to the evaluation of patients at risk for COVID-19 are in a state of rapid change based on information released by regulatory bodies including the CDC and federal and state organizations. These policies and algorithms were followed during the patient's care in the ED.  As part of my medical decision making, I reviewed the following data within the [place] notes reviewed and incorporated, Labs reviewed, notes from prior ED visits and [place] Controlled Substance Database   ____________________________________________   FINAL CLINICAL IMPRESSION(S) / ED DIAGNOSES  Final diagnoses:  Acute  cystitis without hematuria  Malaise and fatigue  Liver transplant status ([place])      NEW MEDICATIONS STARTED DURING THIS VISIT:  New Prescriptions   No medications on file     Note:  This document was prepared using Dragon voice recognition software and may include unintentional dictation errors.    Merlyn Lot, MD 12/01/18 1355

## 2018-12-01 NOTE — Telephone Encounter (Signed)
He is there now---will await their evaluation

## 2018-12-01 NOTE — ED Notes (Signed)
Pt from home with chills, frequent urination, painful urination, foul odor of urine. Pt alert & oriented with NAD noted.

## 2018-12-01 NOTE — Plan of Care (Signed)
  Problem: Urinary Elimination: ?Goal: Signs and symptoms of infection will decrease ?Outcome: Progressing ?  ?Problem: Education: ?Goal: Knowledge of General Education information will improve ?Description: Including pain rating scale, medication(s)/side effects and non-pharmacologic comfort measures ?Outcome: Progressing ?  ?

## 2018-12-01 NOTE — ED Triage Notes (Signed)
Pt reports pain to right back and flank, urinary urgency and frequency, chills and hot flashes since yesterday. Pt concerned he is getting an infection because he states he was told he had kidney problems.

## 2018-12-01 NOTE — H&P (Signed)
[facility] [place] NAME: Devin [name]    MR#:  [id number]  DATE OF BIRTH:  1956-01-23  DATE OF ADMISSION:  12/01/2018  PRIMARY CARE PHYSICIAN: Venia Carbon, MD   REQUESTING/REFERRING PHYSICIAN: dr Quentin Cornwall  CHIEF COMPLAINT:  fever chills and lower back pain yesterday  HISTORY OF PRESENT ILLNESS:  Devin [name]  is a 64 y.o. male with a known history of liver transplant on immunosuppressant. History of liver cirrhosis, gout, Gerd, BPH, hypertension comes to the emergency room with complaints of bilateral lower back pain groin discomfort dysuria fever and chills.. Patient denies any vomiting. He has some nausea.  In the ER he was found to have UTI. [name] count marginally elevated 12,000. Patient is being admitted with acute cystitis. He had UTI in February 2020. Urine cultures grew E. coli at that time.  CT of the abdomen was done. Patient has bilateral renal calculi. No hydronephrosis.  pt is being admitted for further evaluation management.  PAST MEDICAL HISTORY:   Past Medical History:  Diagnosis Date  . Benign prostatic hypertrophy   . GERD (gastroesophageal reflux disease)   . Gout   . History of blood transfusion    pt has antibodies in his blood since previous transfusions  . History of cirrhosis of liver S/P TRANSPLANT 2009  . History of liver failure S/P TRANSPLANT  . Hypertension   . Left ureteral calculus     PAST SURGICAL HISTOIRY:   Past Surgical History:  Procedure Laterality Date  . bone morrow biopsy    . CHOLECYSTECTOMY  2007  . CYSTO/ LEFT RETROGRADE PYELOGRAM/ LEFT URETERAL STENT PLACEMENT  03-05-2012  DR Tresa Moore [facility])   LEFT URETERAL CALCULI  . CYSTOSCOPY W/ URETERAL STENT PLACEMENT  03/11/2012   Procedure: CYSTOSCOPY WITH STENT REPLACEMENT;  Surgeon: Alexis Frock, MD;  Location: [facility];  Service: Urology;  Laterality: Left;  . HERNIA REPAIR  2008  . LIVER BIOPSY    .  LIVER TRANSPLANT  11/24/2007   pt states doing well since liver transplant  . LUMBAR DISC SURGERY    . LUMBAR FUSION    . removal of fistula    . URETEROSCOPY  03/11/2012   Procedure: URETEROSCOPY;  Surgeon: Alexis Frock, MD;  Location: [facility];  Service: Urology;  Laterality: Left;   STONE MANIPULATION, stone obtained  [phone] [employer] MCR    SOCIAL HISTORY:   Social History   Tobacco Use  . Smoking status: Never Smoker  . Smokeless tobacco: Never Used  Substance Use Topics  . Alcohol use: No    FAMILY HISTORY:   Family History  Problem Relation Age of Onset  . Cancer Mother        colon  . Arthritis Mother   . Vasculitis Mother   . Hypertension Mother   . Cancer Father        colon  . Kidney disease Father   . Arthritis Father   . Arthritis Brother   . Alcohol abuse Maternal Aunt   . Diabetes Maternal Aunt   . Alcohol abuse Maternal Uncle   . Diabetes Paternal Aunt   . Alcohol abuse Maternal Uncle   . Alcohol abuse Maternal Uncle     DRUG ALLERGIES:  No Known Allergies  REVIEW OF SYSTEMS:  Review of Systems  Constitutional: Positive for chills, fever and malaise/fatigue. Negative for weight loss.  HENT: Negative for ear discharge, ear pain and nosebleeds.   Eyes:  Negative for blurred vision, pain and discharge.  Respiratory: Negative for sputum production, shortness of breath, wheezing and stridor.   Cardiovascular: Negative for chest pain, palpitations, orthopnea and PND.  Gastrointestinal: Negative for abdominal pain, diarrhea, nausea and vomiting.  Genitourinary: Positive for dysuria, flank pain, frequency and urgency.  Musculoskeletal: Negative for back pain and joint pain.  Neurological: Positive for weakness. Negative for sensory change, speech change and focal weakness.  Psychiatric/Behavioral: Negative for depression and hallucinations. The patient is not nervous/anxious.      MEDICATIONS AT HOME:   Prior to Admission  medications   Medication Sig Start Date End Date Taking? Authorizing Provider  calcium carbonate (TUMS) 500 MG chewable tablet Chew 1 tablet (200 mg of elemental calcium total) by mouth 2 (two) times daily as needed for indigestion or heartburn. 06/26/16  Yes Tonia Ghent, MD  colchicine 0.6 MG tablet TAKE 1 TABLET BY MOUTH TWICE DAILY AS NEEDED FOR GOUT Patient taking differently: Take 0.6 mg by mouth 2 (two) times daily as needed (acute gout).  10/08/18  Yes Venia Carbon, MD  mycophenolate (CELLCEPT) 250 MG capsule Take 500 mg by mouth 2 (two) times daily.  03/10/15  Yes [provider]  nitroGLYCERIN (NITROSTAT) 0.4 MG SL tablet Place 1 tablet (0.4 mg total) under the tongue every 5 (five) minutes as needed for chest pain. 11/29/15  Yes Wende Bushy, MD  omeprazole (PRILOSEC) 20 MG capsule TAKE ONE (1) CAPSULE BY MOUTH 2 TIMES DAILY Patient taking differently: Take 20 mg by mouth 2 (two) times daily.  01/12/18  Yes Venia Carbon, MD  predniSONE (DELTASONE) 5 MG tablet Take 2 tablets (10 mg total) by mouth daily with breakfast. Patient taking differently: Take 5 mg by mouth daily with breakfast.  08/09/18  Yes Irene Pap N, DO  tacrolimus (PROGRAF) 0.5 MG capsule Take 0.5-1 mg by mouth See admin instructions. Take 2 capsules (1mg ) by mouth every morning and 1 capsule (0.5mg ) by mouth every night   Yes [provider]  tamsulosin (FLOMAX) 0.4 MG CAPS capsule TAKE ONE CAPSULE BY MOUTH DAILY Patient taking differently: Take 0.4 mg by mouth daily.  01/14/18  Yes Venia Carbon, MD      VITAL SIGNS:  Blood pressure (!) 163/96, pulse 74, temperature 98 F (36.7 C), resp. rate 14, height 6' (1.829 m), weight 122.5 kg, SpO2 97 %.  PHYSICAL EXAMINATION:  GENERAL:  63 y.o.-year-old patient lying in the bed with no acute distress. Obese EYES: Pupils equal, round, reactive to light and accommodation. No scleral icterus. Extraocular muscles intact.  HEENT: Head atraumatic,  normocephalic. Oropharynx and nasopharynx clear.  NECK:  Supple, no jugular venous distention. No thyroid enlargement, no tenderness.  LUNGS: Normal breath sounds bilaterally, no wheezing, rales,rhonchi or crepitation. No use of accessory muscles of respiration.  CARDIOVASCULAR: S1, S2 normal. No murmurs, rubs, or gallops.  ABDOMEN: Soft, nontender, nondistended. Bowel sounds present. No organomegaly or mass.  EXTREMITIES: No pedal edema, cyanosis, or clubbing.  NEUROLOGIC: Cranial nerves II through XII are intact. Muscle strength 5/5 in all extremities. Sensation intact. Gait not checked.  PSYCHIATRIC: The patient is alert and oriented x 3.  SKIN: No obvious rash, lesion, or ulcer.   LABORATORY PANEL:   CBC Recent Labs  Lab 12/01/18 1013  WBC 12.4*  HGB 15.6  HCT 45.5  PLT 148*   ------------------------------------------------------------------------------------------------------------------  Chemistries  Recent Labs  Lab 12/01/18 1111  NA 138  K 3.3*  CL 104  CO2 22  GLUCOSE 117*  BUN 15  CREATININE 1.55*  CALCIUM 8.6*  AST 22  ALT 35  ALKPHOS 45  BILITOT 2.5*   ------------------------------------------------------------------------------------------------------------------  Cardiac Enzymes No results for input(s): TROPONINI in the last 168 hours. ------------------------------------------------------------------------------------------------------------------  RADIOLOGY:  Ct Renal Stone Study  Result Date: 12/01/2018 CLINICAL DATA:  RIGHT flank and back pain, urinary urgency, frequency, chills and hot flashes since yesterday, history of kidney stones, cirrhosis, hypertension EXAM: CT ABDOMEN AND PELVIS WITHOUT CONTRAST TECHNIQUE: Multidetector CT imaging of the abdomen and pelvis was performed following the standard protocol without IV contrast. Sagittal and coronal MPR images reconstructed from axial data set. No oral contrast administered COMPARISON:   08/04/2018 FINDINGS: Lower chest: Minimal dependent bibasilar atelectasis Hepatobiliary: Gallbladder surgically absent. No definite focal hepatic abnormalities. Pancreas: Atrophic pancreas without mass Spleen: Upper normal size.  No focal abnormality. Adrenals/Urinary Tract: Adrenal glands normal appearance. Tiny BILATERAL nonobstructing renal calculi. BILATERAL low-attenuation renal lesions likely cysts, up to 2.3 x 2.7 cm RIGHT and 2.3 x 2.2 cm LEFT. No hydronephrosis or hydroureter. No ureteral calcifications. Bladder unremarkable. Stomach/Bowel: Normal appendix. Mild sigmoid diverticulosis without evidence of diverticulitis. Stomach and bowel loops otherwise normal appearance. Vascular/Lymphatic: Atherosclerotic calcifications aorta and iliac arteries. Aorta normal caliber. No adenopathy. Reproductive: Mild prostatic enlargement. Seminal vesicles prominent without focal abnormality Other: Small umbilical hernia containing fat. No free air. Minimal free pelvic fluid. Minimal dilatation of the LEFT inguinal ring without gross hernia. Musculoskeletal: Prior lumbar fusion L4-S1. No acute osseous findings. IMPRESSION: BILATERAL nonobstructing renal calculi. BILATERAL renal cysts. Small umbilical hernia containing fat. No acute intra-abdominal or intrapelvic abnormalities. Electronically Signed   By: Lavonia Dana M.D.   On: 12/01/2018 11:17    EKG:    IMPRESSION AND PLAN:   Devin [name]  is a 63 y.o. male with a known history of liver transplant on immunosuppressant. History of liver cirrhosis, gout, Gerd, BPH, hypertension comes to the emergency room with complaints of bilateral lower back pain groin discomfort dysuria fever and chills.. Patient denies any vomiting. He has some nausea.  1. Acute cystitis in the setting of bilateral non-obstruction renal calculi. -Admit to medical floor -patient is placed under observation -IV fluids, IV Rocephin, follow urine culture and sensitivity -encourage fluid  intake  2. Hypertension continue home meds  3. Immunosuppressive drugs in the setting of liver transplant with history of liver cirrhosis -continue prednisone, CellCept, Prograf  4. BPH on Flomax  5. DVT prophylaxis subcu Lovenox  All the records are reviewed and case discussed with ED provider.   CODE STATUS: full code  TOTAL TIME TAKING CARE OF THIS PATIENT: *50* minutes.    Fritzi Mandes M.D on 12/01/2018 at 2:58 PM  Between 7am to 6pm - Pager - [phone]  After 6pm go to [email or web address] - password [email or web address] [facility]  [facility]  Office  [phone]  CC: Primary care physician; Venia Carbon, MD

## 2018-12-01 NOTE — Care Management Obs Status (Signed)
MEDICARE OBSERVATION STATUS NOTIFICATION   Patient Details  Name: Devin [name] MRN: [id number] Date of Birth: 1955/10/07   Medicare Observation Status Notification Given:  Yes  Pt provided verbal understanding.  Pt unable to use signature pad.   Tania Rondo Spittler, LCSW 12/01/2018, 2:28 PM

## 2018-12-01 NOTE — Progress Notes (Signed)
Family Meeting Note  Advance Directive yed today's meeting took place with the patient and the emergency room. Patient presents with fever nausea chills bilateral lower back pain and groin pain. Workup showed acute cystitis. He has history of liver transplant being on immunosuppressant medications. Patient is going to be admitted for IV fluids and antibiotics. Code status address patient wants to be full code. Time spent 16 minutes Fritzi Mandes, MD

## 2018-12-02 DIAGNOSIS — Z8249 Family history of ischemic heart disease and other diseases of the circulatory system: Secondary | ICD-10-CM | POA: Diagnosis not present

## 2018-12-02 DIAGNOSIS — B9620 Unspecified Escherichia coli [E. coli] as the cause of diseases classified elsewhere: Secondary | ICD-10-CM | POA: Diagnosis present

## 2018-12-02 DIAGNOSIS — N400 Enlarged prostate without lower urinary tract symptoms: Secondary | ICD-10-CM | POA: Diagnosis present

## 2018-12-02 DIAGNOSIS — K219 Gastro-esophageal reflux disease without esophagitis: Secondary | ICD-10-CM | POA: Diagnosis present

## 2018-12-02 DIAGNOSIS — Z981 Arthrodesis status: Secondary | ICD-10-CM | POA: Diagnosis not present

## 2018-12-02 DIAGNOSIS — Z20828 Contact with and (suspected) exposure to other viral communicable diseases: Secondary | ICD-10-CM | POA: Diagnosis present

## 2018-12-02 DIAGNOSIS — Z841 Family history of disorders of kidney and ureter: Secondary | ICD-10-CM | POA: Diagnosis not present

## 2018-12-02 DIAGNOSIS — Z7952 Long term (current) use of systemic steroids: Secondary | ICD-10-CM | POA: Diagnosis not present

## 2018-12-02 DIAGNOSIS — Z8261 Family history of arthritis: Secondary | ICD-10-CM | POA: Diagnosis not present

## 2018-12-02 DIAGNOSIS — G4730 Sleep apnea, unspecified: Secondary | ICD-10-CM | POA: Diagnosis present

## 2018-12-02 DIAGNOSIS — Z800 Family history of malignant neoplasm of digestive organs: Secondary | ICD-10-CM | POA: Diagnosis not present

## 2018-12-02 DIAGNOSIS — M109 Gout, unspecified: Secondary | ICD-10-CM | POA: Diagnosis present

## 2018-12-02 DIAGNOSIS — Z79899 Other long term (current) drug therapy: Secondary | ICD-10-CM | POA: Diagnosis not present

## 2018-12-02 DIAGNOSIS — I10 Essential (primary) hypertension: Secondary | ICD-10-CM | POA: Diagnosis present

## 2018-12-02 DIAGNOSIS — N3000 Acute cystitis without hematuria: Secondary | ICD-10-CM | POA: Diagnosis present

## 2018-12-02 DIAGNOSIS — Z833 Family history of diabetes mellitus: Secondary | ICD-10-CM | POA: Diagnosis not present

## 2018-12-02 DIAGNOSIS — Z944 Liver transplant status: Secondary | ICD-10-CM | POA: Diagnosis not present

## 2018-12-02 DIAGNOSIS — N200 Calculus of kidney: Secondary | ICD-10-CM | POA: Diagnosis present

## 2018-12-02 LAB — CBC
HCT: 43.7 % (ref 39.0–52.0)
Hemoglobin: 14.4 g/dL (ref 13.0–17.0)
MCH: 32.3 pg (ref 26.0–34.0)
MCHC: 33 g/dL (ref 30.0–36.0)
MCV: 98 fL (ref 80.0–100.0)
Platelets: 125 10*3/uL — ABNORMAL LOW (ref 150–400)
RBC: 4.46 MIL/uL (ref 4.22–5.81)
RDW: 13.4 % (ref 11.5–15.5)
WBC: 9.2 10*3/uL (ref 4.0–10.5)
nRBC: 0 % (ref 0.0–0.2)

## 2018-12-02 LAB — BASIC METABOLIC PANEL
Anion gap: 8 (ref 5–15)
BUN: 14 mg/dL (ref 6–20)
CO2: 25 mmol/L (ref 22–32)
Calcium: 8.5 mg/dL — ABNORMAL LOW (ref 8.9–10.3)
Chloride: 106 mmol/L (ref 98–111)
Creatinine, Ser: 1.33 mg/dL — ABNORMAL HIGH (ref 0.61–1.24)
GFR calc Af Amer: >60 mL/min (ref >60)
GFR calc non Af Amer: 58 mL/min — ABNORMAL LOW (ref >60)
Glucose, Bld: 125 mg/dL — ABNORMAL HIGH (ref 70–99)
Potassium: 3.5 mmol/L (ref 3.5–5.1)
Sodium: 139 mmol/L (ref 135–145)

## 2018-12-02 MED ORDER — OXYCODONE HCL 5 MG PO TABS
5.0000 mg | ORAL_TABLET | Freq: Four times a day (QID) | ORAL | Status: DC | PRN
  Administered 2018-12-02: 20:00:00 10 mg via ORAL
  Administered 2018-12-02: 5 mg via ORAL
  Administered 2018-12-03 (×2): 10 mg via ORAL
  Filled 2018-12-02 (×2): qty 2
  Filled 2018-12-02: qty 1
  Filled 2018-12-02: qty 2

## 2018-12-02 MED ORDER — SODIUM CHLORIDE 0.9 % IV SOLN
INTRAVENOUS | Status: DC
  Administered 2018-12-02 – 2018-12-03 (×2): via INTRAVENOUS

## 2018-12-02 NOTE — Plan of Care (Signed)
  Problem: Urinary Elimination: Goal: Signs and symptoms of infection will decrease Outcome: Progressing   Problem: Education: Goal: Knowledge of General Education information will improve Description: Including pain rating scale, medication(s)/side effects and non-pharmacologic comfort measures Outcome: Progressing   Problem: Clinical Measurements: Goal: Will remain free from infection Outcome: Progressing   Problem: Pain Managment: Goal: General experience of comfort will improve Outcome: Progressing

## 2018-12-02 NOTE — Progress Notes (Signed)
[place] at [place] NAME: Devin White    MR#:  [id number]  DATE OF BIRTH:  [birth year]  SUBJECTIVE:  CHIEF COMPLAINT:   Chief Complaint  Patient presents with  . Urinary Frequency  . Flank Pain   -Still complaining of lower back pain radiating to bilateral groins, left greater than right -Urine cultures added on.  On IV antibiotics  REVIEW OF SYSTEMS:  Review of Systems  Constitutional: Positive for malaise/fatigue. Negative for chills and fever.  HENT: Negative for ear discharge, hearing loss and nosebleeds.   Eyes: Negative for blurred vision and double vision.  Respiratory: Negative for cough, shortness of breath and wheezing.   Cardiovascular: Negative for chest pain, palpitations and leg swelling.  Gastrointestinal: Negative for abdominal pain, constipation, diarrhea, nausea and vomiting.  Genitourinary: Negative for dysuria and urgency.  Musculoskeletal: Positive for back pain and myalgias.  Neurological: Negative for dizziness, focal weakness, seizures, weakness and headaches.  Psychiatric/Behavioral: Negative for depression and suicidal ideas.    DRUG ALLERGIES:  No Known Allergies  VITALS:  Blood pressure (!) 174/97, pulse 74, temperature 98.4 F (36.9 C), temperature source Oral, resp. rate 18, height 6' (1.829 m), weight 122.5 kg, SpO2 94 %.  PHYSICAL EXAMINATION:  Physical Exam   GENERAL:  63 y.o.-year-old obese patient lying in the bed with no acute distress.  EYES: Pupils equal, round, reactive to light and accommodation. No scleral icterus. Extraocular muscles intact.  HEENT: Head atraumatic, normocephalic. Oropharynx and nasopharynx clear.  NECK:  Supple, no jugular venous distention. No thyroid enlargement, no tenderness.  LUNGS: Normal breath sounds bilaterally, no wheezing, rales,rhonchi or crepitation. No use of accessory muscles of respiration.  Decreased bibasilar breath sounds CARDIOVASCULAR: S1, S2  normal. No murmurs, rubs, or gallops.  ABDOMEN: Soft, obese, nontender, nondistended.  No costovertebral tenderness .  Bilateral groin discomfort present.  Bowel sounds present. No organomegaly or mass.  EXTREMITIES: No pedal edema, cyanosis, or clubbing.  NEUROLOGIC: Cranial nerves II through XII are intact. Muscle strength 5/5 in all extremities. Sensation intact. Gait not checked.  PSYCHIATRIC: The patient is alert and oriented x 3.  SKIN: No obvious rash, lesion, or ulcer.    LABORATORY PANEL:   CBC Recent Labs  Lab 12/01/18 1013  WBC 12.4*  HGB 15.6  HCT 45.5  PLT 148*   ------------------------------------------------------------------------------------------------------------------  Chemistries  Recent Labs  Lab 12/01/18 1111  NA 138  K 3.3*  CL 104  CO2 22  GLUCOSE 117*  BUN 15  CREATININE 1.55*  CALCIUM 8.6*  AST 22  ALT 35  ALKPHOS 45  BILITOT 2.5*   ------------------------------------------------------------------------------------------------------------------  Cardiac Enzymes No results for input(s): TROPONINI in the last 168 hours. ------------------------------------------------------------------------------------------------------------------  RADIOLOGY:  Ct Renal Stone Study  Result Date: 12/01/2018 CLINICAL DATA:  RIGHT flank and back pain, urinary urgency, frequency, chills and hot flashes since yesterday, history of kidney stones, cirrhosis, hypertension EXAM: CT ABDOMEN AND PELVIS WITHOUT CONTRAST TECHNIQUE: Multidetector CT imaging of the abdomen and pelvis was performed following the standard protocol without IV contrast. Sagittal and coronal MPR images reconstructed from axial data set. No oral contrast administered COMPARISON:  08/04/2018 FINDINGS: Lower chest: Minimal dependent bibasilar atelectasis Hepatobiliary: Gallbladder surgically absent. No definite focal hepatic abnormalities. Pancreas: Atrophic pancreas without mass Spleen: Upper  normal size.  No focal abnormality. Adrenals/Urinary Tract: Adrenal glands normal appearance. Tiny BILATERAL nonobstructing renal calculi. BILATERAL low-attenuation renal lesions likely cysts, up to 2.3 x 2.7 cm RIGHT and  2.3 x 2.2 cm LEFT. No hydronephrosis or hydroureter. No ureteral calcifications. Bladder unremarkable. Stomach/Bowel: Normal appendix. Mild sigmoid diverticulosis without evidence of diverticulitis. Stomach and bowel loops otherwise normal appearance. Vascular/Lymphatic: Atherosclerotic calcifications aorta and iliac arteries. Aorta normal caliber. No adenopathy. Reproductive: Mild prostatic enlargement. Seminal vesicles prominent without focal abnormality Other: Small umbilical hernia containing fat. No free air. Minimal free pelvic fluid. Minimal dilatation of the LEFT inguinal ring without gross hernia. Musculoskeletal: Prior lumbar fusion L4-S1. No acute osseous findings. IMPRESSION: BILATERAL nonobstructing renal calculi. BILATERAL renal cysts. Small umbilical hernia containing fat. No acute intra-abdominal or intrapelvic abnormalities. Electronically Signed   By: Lavonia Dana M.D.   On: 12/01/2018 11:17    EKG:   Orders placed or performed during the hospital encounter of 08/04/18  . ED EKG  . ED EKG  . EKG 12-Lead  . EKG 12-Lead    ASSESSMENT AND PLAN:   Devin White  is a 63 y.o. male with a known history of liver transplant on immunosuppressants, gout, Gerd, BPH, hypertension comes to the emergency room with complaints of bilateral lower back pain groin discomfort dysuria fever and chills..    1. Acute cystitis in the setting of bilateral non-obstruction renal calculi. -Spiking fever again last night. -IV fluids, IV Rocephin, follow urine culture and sensitivity -Urology follow-up as outpatient recommended  2. Hypertension continue home meds  3.   History of liver transplant about 11 years ago.  Done for cirrhosis secondary to nonalcoholic  steatohepatitis, -Continue immunosuppressive drugs - on prednisone, CellCept, Prograf  4. BPH on Flomax  5. DVT prophylaxis subcu Lovenox  Encourage ambulation   All the records are reviewed and case discussed with Care Management/Social Workerr. Management plans discussed with the patient, family and they are in agreement.  CODE STATUS: Full code  TOTAL TIME TAKING CARE OF THIS PATIENT: 37 minutes.   POSSIBLE D/C IN 1-2 DAYS, DEPENDING ON CLINICAL CONDITION.   Gladstone Lighter M.D on 12/02/2018 at 12:38 PM  Between 7am to 6pm - Pager - [phone]  After 6pm go to [email or web address] - password [email or web address] [facility]  Office  [phone]  CC: Primary care physician; Venia Carbon, MD

## 2018-12-03 LAB — BASIC METABOLIC PANEL
Anion gap: 8 (ref 5–15)
BUN: 16 mg/dL (ref 6–20)
CO2: 29 mmol/L (ref 22–32)
Calcium: 8.5 mg/dL — ABNORMAL LOW (ref 8.9–10.3)
Chloride: 105 mmol/L (ref 98–111)
Creatinine, Ser: 1.39 mg/dL — ABNORMAL HIGH (ref 0.61–1.24)
GFR calc Af Amer: >60 mL/min (ref >60)
GFR calc non Af Amer: 55 mL/min — ABNORMAL LOW (ref >60)
Glucose, Bld: 92 mg/dL (ref 70–99)
Potassium: 3.6 mmol/L (ref 3.5–5.1)
Sodium: 142 mmol/L (ref 135–145)

## 2018-12-03 LAB — URIC ACID: Uric Acid, Serum: 8.5 mg/dL (ref 3.7–8.6)

## 2018-12-03 LAB — CBC
HCT: 41.3 % (ref 39.0–52.0)
Hemoglobin: 13.8 g/dL (ref 13.0–17.0)
MCH: 32.5 pg (ref 26.0–34.0)
MCHC: 33.4 g/dL (ref 30.0–36.0)
MCV: 97.2 fL (ref 80.0–100.0)
Platelets: 130 10*3/uL — ABNORMAL LOW (ref 150–400)
RBC: 4.25 MIL/uL (ref 4.22–5.81)
RDW: 13.3 % (ref 11.5–15.5)
WBC: 8.2 10*3/uL (ref 4.0–10.5)
nRBC: 0 % (ref 0.0–0.2)

## 2018-12-03 MED ORDER — SENNOSIDES-DOCUSATE SODIUM 8.6-50 MG PO TABS
1.0000 | ORAL_TABLET | Freq: Two times a day (BID) | ORAL | Status: DC
  Administered 2018-12-03 – 2018-12-04 (×3): 1 via ORAL
  Filled 2018-12-03 (×3): qty 1

## 2018-12-03 MED ORDER — COLCHICINE 0.6 MG PO TABS
0.6000 mg | ORAL_TABLET | Freq: Two times a day (BID) | ORAL | Status: DC
  Administered 2018-12-03 – 2018-12-04 (×3): 0.6 mg via ORAL
  Filled 2018-12-03 (×4): qty 1

## 2018-12-03 NOTE — Progress Notes (Signed)
[place] at [place] NAME: Devin White    MR#:  [id number]  DATE OF BIRTH:  [birth year]  SUBJECTIVE:  CHIEF COMPLAINT:   Chief Complaint  Patient presents with  . Urinary Frequency  . Flank Pain   -Back pain is improving.  Still has left groin pain occasionally when urinating.  Continue IV antibiotics for 1 more day.  Sensitivities are pending -Complains of left big toe swelling and tenderness today.  Has known history of gout  REVIEW OF SYSTEMS:  Review of Systems  Constitutional: Positive for malaise/fatigue. Negative for chills and fever.  HENT: Negative for ear discharge, hearing loss and nosebleeds.   Eyes: Negative for blurred vision and double vision.  Respiratory: Negative for cough, shortness of breath and wheezing.   Cardiovascular: Negative for chest pain, palpitations and leg swelling.  Gastrointestinal: Negative for abdominal pain, constipation, diarrhea, nausea and vomiting.  Genitourinary: Negative for dysuria and urgency.  Musculoskeletal: Positive for back pain and myalgias.  Neurological: Negative for dizziness, focal weakness, seizures, weakness and headaches.  Psychiatric/Behavioral: Negative for depression and suicidal ideas.    DRUG ALLERGIES:  No Known Allergies  VITALS:  Blood pressure (!) 178/98, pulse (!) 56, temperature 98.6 F (37 C), temperature source Oral, resp. rate 18, height 6' (1.829 m), weight 122.5 kg, SpO2 95 %.  PHYSICAL EXAMINATION:  Physical Exam   GENERAL:  63 y.o.-year-old obese patient lying in the bed with no acute distress.  EYES: Pupils equal, round, reactive to light and accommodation. No scleral icterus. Extraocular muscles intact.  HEENT: Head atraumatic, normocephalic. Oropharynx and nasopharynx clear.  NECK:  Supple, no jugular venous distention. No thyroid enlargement, no tenderness.  LUNGS: Normal breath sounds bilaterally, no wheezing, rales,rhonchi or crepitation. No use  of accessory muscles of respiration.  Decreased bibasilar breath sounds CARDIOVASCULAR: S1, S2 normal. No murmurs, rubs, or gallops.  ABDOMEN: Soft, obese, nontender, nondistended.  No costovertebral tenderness .  Bilateral groin discomfort present.  Bowel sounds present. No organomegaly or mass.  EXTREMITIES: No pedal edema, cyanosis, or clubbing.  NEUROLOGIC: Cranial nerves II through XII are intact. Muscle strength 5/5 in all extremities. Sensation intact. Gait not checked.  PSYCHIATRIC: The patient is alert and oriented x 3.  SKIN: No obvious rash, lesion, or ulcer.    LABORATORY PANEL:   CBC Recent Labs  Lab 12/03/18 0650  WBC 8.2  HGB 13.8  HCT 41.3  PLT 130*   ------------------------------------------------------------------------------------------------------------------  Chemistries  Recent Labs  Lab 12/01/18 1111  12/03/18 0650  NA 138   < > 142  K 3.3*   < > 3.6  CL 104   < > 105  CO2 22   < > 29  GLUCOSE 117*   < > 92  BUN 15   < > 16  CREATININE 1.55*   < > 1.39*  CALCIUM 8.6*   < > 8.5*  AST 22  --   --   ALT 35  --   --   ALKPHOS 45  --   --   BILITOT 2.5*  --   --    < > = values in this interval not displayed.   ------------------------------------------------------------------------------------------------------------------  Cardiac Enzymes No results for input(s): TROPONINI in the last 168 hours. ------------------------------------------------------------------------------------------------------------------  RADIOLOGY:  No results found.  EKG:   Orders placed or performed during the hospital encounter of 08/04/18  . ED EKG  . ED EKG  . EKG 12-Lead  .  EKG 12-Lead    ASSESSMENT AND PLAN:   Devin White  is a 63 y.o. male with a known history of liver transplant on immunosuppressants, gout, Gerd, BPH, hypertension comes to the emergency room with complaints of bilateral lower back pain groin discomfort dysuria fever and chills..     1. Acute cystitis in the setting of bilateral non-obstruction renal calculi. -No further fevers today -IV fluids, IV Rocephin, follow urine culture and sensitivity -Urology follow-up as outpatient recommended  2. Hypertension continue home meds  3.   History of liver transplant about 11 years ago.  Done for cirrhosis secondary to nonalcoholic steatohepatitis, -Continue immunosuppressive drugs - on prednisone, CellCept, Prograf  4. BPH on Flomax  5. DVT prophylaxis subcu Lovenox  6. Left big toe gout- check uric acid level.  Started colchicine  Encourage ambulation   All the records are reviewed and case discussed with Care Management/Social Workerr. Management plans discussed with the patient, family and they are in agreement.  CODE STATUS: Full code  TOTAL TIME TAKING CARE OF THIS PATIENT: 38 minutes.   POSSIBLE D/C IN 1-2 DAYS, DEPENDING ON CLINICAL CONDITION.   Gladstone Lighter M.D on 12/03/2018 at 1:04 PM  Between 7am to 6pm - Pager - [phone]  After 6pm go to [email or web address] - password [email or web address] [facility]  Office  [phone]  CC: Primary care physician; Venia Carbon, MD

## 2018-12-04 LAB — URINALYSIS, COMPLETE (UACMP) WITH MICROSCOPIC
Bacteria, UA: NONE SEEN
Bilirubin Urine: NEGATIVE
Glucose, UA: NEGATIVE mg/dL
Ketones, ur: NEGATIVE mg/dL
Nitrite: NEGATIVE
Protein, ur: NEGATIVE mg/dL
Specific Gravity, Urine: 1.01 (ref 1.005–1.030)
pH: 6 (ref 5.0–8.0)

## 2018-12-04 LAB — URINE CULTURE: Culture: >=100000 — AB

## 2018-12-04 LAB — BASIC METABOLIC PANEL
Anion gap: 8 (ref 5–15)
BUN: 17 mg/dL (ref 6–20)
CO2: 27 mmol/L (ref 22–32)
Calcium: 8.7 mg/dL — ABNORMAL LOW (ref 8.9–10.3)
Chloride: 106 mmol/L (ref 98–111)
Creatinine, Ser: 1.38 mg/dL — ABNORMAL HIGH (ref 0.61–1.24)
GFR calc Af Amer: >60 mL/min (ref >60)
GFR calc non Af Amer: 55 mL/min — ABNORMAL LOW (ref >60)
Glucose, Bld: 102 mg/dL — ABNORMAL HIGH (ref 70–99)
Potassium: 3.9 mmol/L (ref 3.5–5.1)
Sodium: 141 mmol/L (ref 135–145)

## 2018-12-04 MED ORDER — AMLODIPINE BESYLATE 5 MG PO TABS
5.0000 mg | ORAL_TABLET | Freq: Every day | ORAL | Status: DC
  Administered 2018-12-04: 09:00:00 5 mg via ORAL
  Filled 2018-12-04: qty 1

## 2018-12-04 MED ORDER — SULFAMETHOXAZOLE-TRIMETHOPRIM 800-160 MG PO TABS: 1.0000 | ORAL_TABLET | Freq: Two times a day (BID) | ORAL | 0 refills | Status: AC

## 2018-12-04 MED ORDER — SULFAMETHOXAZOLE-TRIMETHOPRIM 800-160 MG PO TABS
1.0000 | ORAL_TABLET | Freq: Two times a day (BID) | ORAL | Status: DC
  Filled 2018-12-04 (×2): qty 1

## 2018-12-04 MED ORDER — AMLODIPINE BESYLATE 5 MG PO TABS: 5.0000 mg | ORAL_TABLET | Freq: Every day | ORAL | 0 refills | Status: DC

## 2018-12-04 MED ORDER — CIPROFLOXACIN HCL 500 MG PO TABS: 500.0000 mg | ORAL_TABLET | Freq: Two times a day (BID) | ORAL | Status: DC

## 2018-12-04 NOTE — Discharge Summary (Signed)
[place] at [place] NAME: Devin White    MR#:  [id number]  DATE OF BIRTH:  [birth year]  DATE OF ADMISSION:  12/01/2018   ADMITTING PHYSICIAN: Fritzi Mandes, MD  DATE OF DISCHARGE:  12/04/18  PRIMARY CARE PHYSICIAN: Venia Carbon, MD   ADMISSION DIAGNOSIS:   Acute cystitis without hematuria [N30.00] Malaise and fatigue [R53.81, R53.83] Liver transplant status ([place]) [Z94.4]  DISCHARGE DIAGNOSIS:   Active Problems:   Cystitis   SECONDARY DIAGNOSIS:   Past Medical History:  Diagnosis Date  . Benign prostatic hypertrophy   . GERD (gastroesophageal reflux disease)   . Gout   . History of blood transfusion    pt has antibodies in his blood since previous transfusions  . History of cirrhosis of liver S/P TRANSPLANT 2009  . History of liver failure S/P TRANSPLANT  . Hypertension   . Left ureteral calculus     HOSPITAL COURSE:   Devin White a63 y.o.malewith a known history of liver transplant on immunosuppressants, gout, Gerd, BPH, hypertension comes to the emergency room with complaints of bilateral lower back pain groin discomfort dysuria fever and chills.Marland Kitchen    1.Acute cystitis in the setting of bilateral non-obstruction renal calculi. -No further fevers now, ABC is normal. -Received IV fluids and Rocephin.  -Urine cultures growing E. coli sensitive to fluoroquinolones, Bactrim and cephalosporins.  Will be discharged on Bactrim-due to recurrent UTIs, concern for prostatitis. -BMP ordered with PCP in 1 week to look into renal function and potassium while on Bactrim -Urology follow-up as outpatient recommended for recurrent UTIs.  2.Hypertension-has had fluctuating blood pressures in the past during hospital admissions.  Started on low-dose Norvasc.  Has a PCP appointment next week and can be monitored.  Denies any symptoms at this time  3.  History of liver transplant about 11 years ago.  Done for cirrhosis  secondary to nonalcoholic steatohepatitis, -Continue immunosuppressive drugs - on prednisone, CellCept, Prograf -Continue follow-up with [facility].  4.BPH on Flomax  5. Left big toe gout-acute attack.  Elevated uric acid level at 8.5.  Patient takes colchicine as needed at home for the same.  Has been restarted.  Ambulating well in room.  Will be discharged home today    DISCHARGE CONDITIONS:   Stable  CONSULTS OBTAINED:   None  DRUG ALLERGIES:   No Known Allergies DISCHARGE MEDICATIONS:   Allergies as of 12/04/2018   No Known Allergies     Medication List    TAKE these medications   amLODipine 5 MG tablet Commonly known as: NORVASC Take 1 tablet (5 mg total) by mouth daily. Start taking on: December 05, 2018   calcium carbonate 500 MG chewable tablet Commonly known as: Tums Chew 1 tablet (200 mg of elemental calcium total) by mouth 2 (two) times daily as needed for indigestion or heartburn.   colchicine 0.6 MG tablet TAKE 1 TABLET BY MOUTH TWICE DAILY AS NEEDED FOR GOUT What changed: See the new instructions.   mycophenolate 250 MG capsule Commonly known as: CELLCEPT Take 500 mg by mouth 2 (two) times daily.   nitroGLYCERIN 0.4 MG SL tablet Commonly known as: NITROSTAT Place 1 tablet (0.4 mg total) under the tongue every 5 (five) minutes as needed for chest pain.   omeprazole 20 MG capsule Commonly known as: PRILOSEC TAKE ONE (1) CAPSULE BY MOUTH 2 TIMES DAILY What changed:   how much to take  how to take this  when to take this  additional  instructions   predniSONE 5 MG tablet Commonly known as: DELTASONE Take 2 tablets (10 mg total) by mouth daily with breakfast. What changed: how much to take   sulfamethoxazole-trimethoprim 800-160 MG tablet Commonly known as: BACTRIM DS Take 1 tablet by mouth every 12 (twelve) hours for 5 days.   tacrolimus 0.5 MG capsule Commonly known as: PROGRAF Take 0.5-1 mg by mouth See admin instructions. Take 2  capsules (1mg ) by mouth every morning and 1 capsule (0.5mg ) by mouth every night   tamsulosin 0.4 MG Caps capsule Commonly known as: FLOMAX TAKE ONE CAPSULE BY MOUTH DAILY        DISCHARGE INSTRUCTIONS:   1. PCP f/u in 1-2 weeks 2. Urology f/u in 3- 4 weeks  DIET:   Cardiac diet  ACTIVITY:   Activity as tolerated  OXYGEN:   Home Oxygen: No.  Oxygen Delivery: room air  DISCHARGE LOCATION:   home   If you experience worsening of your admission symptoms, develop shortness of breath, life threatening emergency, suicidal or homicidal thoughts you must seek medical attention immediately by calling 911 or calling your MD immediately  if symptoms less severe.  You Must read complete instructions/literature along with all the possible adverse reactions/side effects for all the Medicines you take and that have been prescribed to you. Take any new Medicines after you have completely understood and accpet all the possible adverse reactions/side effects.   Please note  You were cared for by a hospitalist during your hospital stay. If you have any questions about your discharge medications or the care you received while you were in the hospital after you are discharged, you can call the unit and asked to speak with the hospitalist on call if the hospitalist that took care of you is not available. Once you are discharged, your primary care physician will handle any further medical issues. Please note that NO REFILLS for any discharge medications will be authorized once you are discharged, as it is imperative that you return to your primary care physician (or establish a relationship with a primary care physician if you do not have one) for your aftercare needs so that they can reassess your need for medications and monitor your lab values.    On the day of Discharge:  VITAL SIGNS:   Blood pressure (!) 161/91, pulse (!) 54, temperature 97.8 F (36.6 C), resp. rate 20, height 6' (1.829  m), weight 122.5 kg, SpO2 97 %.  PHYSICAL EXAMINATION:    GENERAL:  63 y.o.-year-old obese patient lying in the bed with no acute distress.  EYES: Pupils equal, round, reactive to light and accommodation. No scleral icterus. Extraocular muscles intact.  HEENT: Head atraumatic, normocephalic. Oropharynx and nasopharynx clear.  NECK:  Supple, no jugular venous distention. No thyroid enlargement, no tenderness.  LUNGS: Normal breath sounds bilaterally, no wheezing, rales,rhonchi or crepitation. No use of accessory muscles of respiration.  Decreased bibasilar breath sounds CARDIOVASCULAR: S1, S2 normal. No murmurs, rubs, or gallops.  ABDOMEN: Soft, obese, nontender, nondistended.  No costovertebral tenderness .  Bilateral groin discomfort present.  Bowel sounds present. No organomegaly or mass.  EXTREMITIES: No pedal edema, cyanosis, or clubbing.  NEUROLOGIC: Cranial nerves II through XII are intact. Muscle strength 5/5 in all extremities. Sensation intact. Gait not checked.  PSYCHIATRIC: The patient is alert and oriented x 3.  SKIN: No obvious rash, lesion, or ulcer.   DATA REVIEW:   CBC Recent Labs  Lab 12/03/18 0650  WBC 8.2  HGB 13.8  HCT 41.3  PLT 130*    Chemistries  Recent Labs  Lab 12/01/18 1111  12/04/18 0421  NA 138   < > 141  K 3.3*   < > 3.9  CL 104   < > 106  CO2 22   < > 27  GLUCOSE 117*   < > 102*  BUN 15   < > 17  CREATININE 1.55*   < > 1.38*  CALCIUM 8.6*   < > 8.7*  AST 22  --   --   ALT 35  --   --   ALKPHOS 45  --   --   BILITOT 2.5*  --   --    < > = values in this interval not displayed.     Microbiology Results  Results for orders placed or performed during the hospital encounter of 12/01/18  Urine Culture     Status: Abnormal   Collection Time: 12/01/18  1:02 PM   Specimen: Urine, Clean Catch  Result Value Ref Range Status   Specimen Description   Final    URINE, CLEAN CATCH Performed at [facility], [address]  [place], [place] [address]    Special Requests   Final    NONE Performed at [facility], [place], [place] [address]    Culture >=100,000 COLONIES/mL ESCHERICHIA COLI (A)  Final   Report Status 12/04/2018 FINAL  Final   Organism ID, Bacteria ESCHERICHIA COLI (A)  Final      Susceptibility   Escherichia coli - MIC*    AMPICILLIN >=32 RESISTANT Resistant     CEFAZOLIN <=4 SENSITIVE Sensitive     CEFTRIAXONE <=1 SENSITIVE Sensitive     CIPROFLOXACIN <=0.25 SENSITIVE Sensitive     GENTAMICIN <=1 SENSITIVE Sensitive     IMIPENEM <=0.25 SENSITIVE Sensitive     NITROFURANTOIN <=16 SENSITIVE Sensitive     TRIMETH/SULFA >=320 RESISTANT Resistant     AMPICILLIN/SULBACTAM >=32 RESISTANT Resistant     PIP/TAZO <=4 SENSITIVE Sensitive     Extended ESBL NEGATIVE Sensitive     * >=100,000 COLONIES/mL ESCHERICHIA COLI  SARS Coronavirus 2 (CEPHEID - Performed in [facility] hospital lab), Hosp Order     Status: None   Collection Time: 12/01/18  2:28 PM   Specimen: Nasopharyngeal Swab  Result Value Ref Range Status   SARS Coronavirus 2 NEGATIVE NEGATIVE Final    Comment: (NOTE) If result is NEGATIVE SARS-CoV-2 target nucleic acids are NOT DETECTED. The SARS-CoV-2 RNA is generally detectable in upper and lower  respiratory specimens during the acute phase of infection. The lowest  concentration of SARS-CoV-2 viral copies this assay can detect is 250  copies / mL. A negative result does not preclude SARS-CoV-2 infection  and should not be used as the sole basis for treatment or other  patient management decisions.  A negative result may occur with  improper specimen collection / handling, submission of specimen other  than nasopharyngeal swab, presence of viral mutation(s) within the  areas targeted by this assay, and inadequate number of viral copies  (<250 copies / mL). A negative result must be combined with clinical  observations, patient history, and epidemiological  information. If result is POSITIVE SARS-CoV-2 target nucleic acids are DETECTED. The SARS-CoV-2 RNA is generally detectable in upper and lower  respiratory specimens dur ing the acute phase of infection.  Positive  results are indicative of active infection with SARS-CoV-2.  Clinical  correlation with patient history and  other diagnostic information is  necessary to determine patient infection status.  Positive results do  not rule out bacterial infection or co-infection with other viruses. If result is PRESUMPTIVE POSTIVE SARS-CoV-2 nucleic acids MAY BE PRESENT.   A presumptive positive result was obtained on the submitted specimen  and confirmed on repeat testing.  While 2019 novel coronavirus  (SARS-CoV-2) nucleic acids may be present in the submitted sample  additional confirmatory testing may be necessary for epidemiological  and / or clinical management purposes  to differentiate between  SARS-CoV-2 and other Sarbecovirus currently known to infect humans.  If clinically indicated additional testing with an alternate test  methodology [phone]) is advised. The SARS-CoV-2 RNA is generally  detectable in upper and lower respiratory sp ecimens during the acute  phase of infection. The expected result is Negative. Fact Sheet for Patients:  StrictlyIdeas.no Fact Sheet for Healthcare Providers: BankingDealers.co.za This test is not yet approved or cleared by the [place] FDA and has been authorized for detection and/or diagnosis of SARS-CoV-2 by FDA under an Emergency Use Authorization (EUA).  This EUA will remain in effect (meaning this test can be used) for the duration of the COVID-19 declaration under Section 564(b)(1) of the Act, 21 U.S.C. section 360bbb-3(b)(1), unless the authorization is terminated or revoked sooner. Performed at [facility], [address] [place], [place] [address]     RADIOLOGY:  No  results found.   Management plans discussed with the patient, family and they are in agreement.  CODE STATUS:     Code Status Orders  (From admission, onward)         Start     Ordered   12/01/18 1536  Full code  Continuous     12/01/18 1535        Code Status History    Date Active Date Inactive Code Status Order ID Comments User Context   08/04/2018 2208 08/08/2018 1956 Full Code [id number]  Etta Quill, DO ED   Advance Care Planning Activity      TOTAL TIME TAKING CARE OF THIS PATIENT: 38 minutes.    Gladstone Lighter M.D on 12/04/2018 at 2:12 PM  Between 7am to 6pm - Pager - [phone]  After 6pm go to [email or web address] - Proofreader  [facility]  Office  [phone]  CC: Primary care physician; Venia Carbon, MD   Note: This dictation was prepared with Dragon dictation along with smaller phrase technology. Any transcriptional errors that result from this process are unintentional.

## 2018-12-04 NOTE — Plan of Care (Signed)
  Problem: Urinary Elimination: Goal: Signs and symptoms of infection will decrease Outcome: Progressing   Problem: Education: Goal: Knowledge of General Education information will improve Description: Including pain rating scale, medication(s)/side effects and non-pharmacologic comfort measures Outcome: Progressing   Problem: Clinical Measurements: Goal: Will remain free from infection Outcome: Progressing   Problem: Pain Managment: Goal: General experience of comfort will improve Outcome: Progressing

## 2018-12-04 NOTE — Progress Notes (Signed)
Pt discharged home via w/c  At this time w/o c/o. A/o. Sl d/cd. Instructions discussed with pt. Meds/ diet / activity and f/u discussed.  Verbalizes understanding.

## 2018-12-04 NOTE — Care Management Important Message (Signed)
Important Message  Patient Details  Name: Devin White MRN: [id number] Date of Birth: [birth year]   Medicare Important Message Given:  Yes  Initial Medicare IM given by Patient Access Associate on 12/03/2018 at 1051.  Still valid.   Dannette Barbara 12/04/2018, 8:49 AM

## 2018-12-04 NOTE — Progress Notes (Signed)
Discussed with pharmacy- it was overlooked that patients E.coli was resistant to bactrim- will call his pharmacy and change his ABX to keflex.

## 2018-12-07 ENCOUNTER — Telehealth: Payer: Self-pay

## 2018-12-07 NOTE — Telephone Encounter (Signed)
Attempted to reach patient to complete TCM call and to schedule hospital f/u appt. Attempt unsuccessful. Left message with contact info.

## 2018-12-08 NOTE — Telephone Encounter (Signed)
Unable to reach pt - LVM

## 2018-12-14 ENCOUNTER — Encounter: Payer: Self-pay | Admitting: Internal Medicine

## 2018-12-14 ENCOUNTER — Other Ambulatory Visit: Payer: Self-pay

## 2018-12-14 ENCOUNTER — Ambulatory Visit (INDEPENDENT_AMBULATORY_CARE_PROVIDER_SITE_OTHER): Payer: Medicare Other | Admitting: Internal Medicine

## 2018-12-14 DIAGNOSIS — I10 Essential (primary) hypertension: Secondary | ICD-10-CM | POA: Diagnosis not present

## 2018-12-14 DIAGNOSIS — N183 Chronic kidney disease, stage 3 unspecified: Secondary | ICD-10-CM

## 2018-12-14 DIAGNOSIS — M1000 Idiopathic gout, unspecified site: Secondary | ICD-10-CM | POA: Diagnosis not present

## 2018-12-14 DIAGNOSIS — N390 Urinary tract infection, site not specified: Secondary | ICD-10-CM | POA: Diagnosis not present

## 2018-12-14 NOTE — Assessment & Plan Note (Signed)
Actually improved this past admission

## 2018-12-14 NOTE — Progress Notes (Signed)
Subjective:    Patient ID: Devin White, male    DOB: July 13, 1955, 63 y.o.   MRN: [id number]  HPI Here for hospital follow up Records reviewed  Had the same symptoms as February---low back pain into the groin Wonders if he ever got over the original infection Had fever in the hospital Did have some dysuria and foul odor to urine Some increased frequency  Had pansensitive E coli Rx with rocephin then home on bactrim (finished a week ago)--then told not to take the bactrim and given ?cephalexin  No fever Still slight back and left groin pain Did have repeat urinalysis in hospital which was normal  BP up while in hospital This always happens to him Didn't take the amlodipine  Gout flare in the hospital  This is typical  Now calmed down again---colchicine prn only  GFR in the 40's---but actually was higher during this admission (50's)  Got outside this weekend --working on shrubs, etc Really broke a sweat --and felt better  Current Outpatient Medications on File Prior to Visit  Medication Sig Dispense Refill  . calcium carbonate (TUMS) 500 MG chewable tablet Chew 1 tablet (200 mg of elemental calcium total) by mouth 2 (two) times daily as needed for indigestion or heartburn. 180 tablet 1  . colchicine 0.6 MG tablet TAKE 1 TABLET BY MOUTH TWICE DAILY AS NEEDED FOR GOUT (Patient taking differently: Take 0.6 mg by mouth 2 (two) times daily as needed (acute gout). ) 30 tablet 2  . mycophenolate (CELLCEPT) 250 MG capsule Take 500 mg by mouth 2 (two) times daily.     . nitroGLYCERIN (NITROSTAT) 0.4 MG SL tablet Place 1 tablet (0.4 mg total) under the tongue every 5 (five) minutes as needed for chest pain. 25 tablet 6  . omeprazole (PRILOSEC) 20 MG capsule TAKE ONE (1) CAPSULE BY MOUTH 2 TIMES DAILY (Patient taking differently: Take 20 mg by mouth 2 (two) times daily. ) 180 capsule 3  . predniSONE (DELTASONE) 5 MG tablet Take 2 tablets (10 mg total) by mouth daily with breakfast.  (Patient taking differently: Take 5 mg by mouth daily with breakfast. ) 30 tablet 0  . tacrolimus (PROGRAF) 0.5 MG capsule Take 0.5-1 mg by mouth See admin instructions. Take 2 capsules (1mg ) by mouth every morning and 1 capsule (0.5mg ) by mouth every night    . tamsulosin (FLOMAX) 0.4 MG CAPS capsule TAKE ONE CAPSULE BY MOUTH DAILY (Patient taking differently: Take 0.4 mg by mouth daily. ) 90 capsule 3  . amLODipine (NORVASC) 5 MG tablet Take 1 tablet (5 mg total) by mouth daily. (Patient not taking: Reported on 12/14/2018) 30 tablet 0   No current facility-administered medications on file prior to visit.     No Known Allergies  Past Medical History:  Diagnosis Date  . Benign prostatic hypertrophy   . GERD (gastroesophageal reflux disease)   . Gout   . History of blood transfusion    pt has antibodies in his blood since previous transfusions  . History of cirrhosis of liver S/P TRANSPLANT 2009  . History of liver failure S/P TRANSPLANT  . Hypertension   . Left ureteral calculus     Past Surgical History:  Procedure Laterality Date  . bone morrow biopsy    . CHOLECYSTECTOMY  2007  . CYSTO/ LEFT RETROGRADE PYELOGRAM/ LEFT URETERAL STENT PLACEMENT  03-05-2012  DR Devin White [facility])   LEFT URETERAL CALCULI  . CYSTOSCOPY W/ URETERAL STENT PLACEMENT  03/11/2012   Procedure: CYSTOSCOPY  WITH STENT REPLACEMENT;  Surgeon: Devin Frock, MD;  Location: [facility];  Service: Urology;  Laterality: Left;  . HERNIA REPAIR  2008  . LIVER BIOPSY    . LIVER TRANSPLANT  11/24/2007   pt states doing well since liver transplant  . LUMBAR DISC SURGERY    . LUMBAR FUSION    . removal of fistula    . URETEROSCOPY  03/11/2012   Procedure: URETEROSCOPY;  Surgeon: Devin Frock, MD;  Location: [facility];  Service: Urology;  Laterality: Left;   STONE MANIPULATION, stone obtained  [facility]    Family History  Problem Relation Age of Onset  . Cancer Mother         colon  . Arthritis Mother   . Vasculitis Mother   . Hypertension Mother   . Cancer Father        colon  . Kidney disease Father   . Arthritis Father   . Arthritis Brother   . Alcohol abuse Maternal Aunt   . Diabetes Maternal Aunt   . Alcohol abuse Maternal Uncle   . Diabetes Paternal Aunt   . Alcohol abuse Maternal Uncle   . Alcohol abuse Maternal Uncle     Social History   Socioeconomic History  . Marital status: Divorced    Spouse name: Not on file  . Number of children: 3  . Years of education: Not on file  . Highest education level: Not on file  Occupational History  . Occupation: disabled    Employer: RETIRED  Social Needs  . Financial resource strain: Not on file  . Food insecurity    Worry: Not on file    Inability: Not on file  . Transportation needs    Medical: Not on file    Non-medical: Not on file  Tobacco Use  . Smoking status: Never Smoker  . Smokeless tobacco: Never Used  Substance and Sexual Activity  . Alcohol use: No  . Drug use: No  . Sexual activity: Not on file  Lifestyle  . Physical activity    Days per week: Not on file    Minutes per session: Not on file  . Stress: Not on file  Relationships  . Social Herbalist on phone: Not on file    Gets together: Not on file    Attends religious service: Not on file    Active member of club or organization: Not on file    Attends meetings of clubs or organizations: Not on file    Relationship status: Not on file  . Intimate partner violence    Fear of current or ex partner: Not on file    Emotionally abused: Not on file    Physically abused: Not on file    Forced sexual activity: Not on file  Other Topics Concern  . Not on file  Social History Narrative   Has living will   Requests daughter Devin White as his health care POA   Would accept brief attempt at resuscitation but no prolonged ventilation   No tube feeds if cognitively unaware   Review of Systems Appetite is fine  Weight fairly stable Now sleeping better in the last couple of months    Objective:   Physical Exam  Constitutional: He appears well-developed. No distress.  Neck: No thyromegaly present.  Cardiovascular: Normal rate, regular rhythm and normal heart sounds. Exam reveals no gallop.  No murmur heard. Respiratory: Effort normal and breath sounds normal.  No respiratory distress. He has no wheezes. He has no rales.  GI: Soft. He exhibits no distension. There is no abdominal tenderness. There is no rebound and no guarding.  Musculoskeletal:        General: No edema.     Comments: No CVA tenderness  Lymphadenopathy:    He has no cervical adenopathy.  Psychiatric: He has a normal mood and affect. His behavior is normal.           Assessment & Plan:

## 2018-12-14 NOTE — Assessment & Plan Note (Signed)
Flared with illness Better now Colchicine prn

## 2018-12-14 NOTE — Assessment & Plan Note (Signed)
BP Readings from Last 3 Encounters:  12/14/18 130/80  12/04/18 (!) 161/91  08/12/18 102/70   High in the hospital but fine otherwise No need for more medications

## 2018-12-14 NOTE — Assessment & Plan Note (Signed)
Recurrent--but 4 months later Symptoms mostly gone now (vague pain only) Due to see urologist---doesn't appear to be related to stones but ??prostatitis Continues on tamsulosin

## 2019-01-04 ENCOUNTER — Ambulatory Visit: Payer: Self-pay | Admitting: Urology

## 2019-02-03 ENCOUNTER — Other Ambulatory Visit: Payer: Self-pay | Admitting: Internal Medicine

## 2019-02-03 DIAGNOSIS — Z01812 Encounter for preprocedural laboratory examination: Secondary | ICD-10-CM | POA: Diagnosis not present

## 2019-02-03 DIAGNOSIS — D899 Disorder involving the immune mechanism, unspecified: Secondary | ICD-10-CM | POA: Diagnosis not present

## 2019-02-03 DIAGNOSIS — Z944 Liver transplant status: Secondary | ICD-10-CM | POA: Diagnosis not present

## 2019-02-03 DIAGNOSIS — N390 Urinary tract infection, site not specified: Secondary | ICD-10-CM | POA: Diagnosis not present

## 2019-02-03 DIAGNOSIS — Z87442 Personal history of urinary calculi: Secondary | ICD-10-CM | POA: Diagnosis not present

## 2019-02-03 DIAGNOSIS — R7989 Other specified abnormal findings of blood chemistry: Secondary | ICD-10-CM | POA: Diagnosis not present

## 2019-02-18 DIAGNOSIS — Z944 Liver transplant status: Secondary | ICD-10-CM | POA: Diagnosis not present

## 2019-02-18 DIAGNOSIS — D899 Disorder involving the immune mechanism, unspecified: Secondary | ICD-10-CM | POA: Diagnosis not present

## 2019-03-05 ENCOUNTER — Other Ambulatory Visit: Payer: Self-pay | Admitting: Internal Medicine

## 2019-03-11 ENCOUNTER — Other Ambulatory Visit: Payer: Self-pay

## 2019-03-11 ENCOUNTER — Emergency Department: Payer: Medicare Other

## 2019-03-11 ENCOUNTER — Encounter: Payer: Self-pay | Admitting: Emergency Medicine

## 2019-03-11 ENCOUNTER — Inpatient Hospital Stay
Admission: EM | Admit: 2019-03-11 | Discharge: 2019-03-14 | DRG: 690 | Disposition: A | Discharge status: Home / Self Care | Payer: Medicare Other | Attending: Internal Medicine | Admitting: Internal Medicine

## 2019-03-11 DIAGNOSIS — K219 Gastro-esophageal reflux disease without esophagitis: Secondary | ICD-10-CM | POA: Diagnosis not present

## 2019-03-11 DIAGNOSIS — N390 Urinary tract infection, site not specified: Secondary | ICD-10-CM | POA: Diagnosis present

## 2019-03-11 DIAGNOSIS — R109 Unspecified abdominal pain: Secondary | ICD-10-CM | POA: Diagnosis not present

## 2019-03-11 DIAGNOSIS — K567 Ileus, unspecified: Secondary | ICD-10-CM | POA: Diagnosis present

## 2019-03-11 DIAGNOSIS — N179 Acute kidney failure, unspecified: Secondary | ICD-10-CM | POA: Diagnosis present

## 2019-03-11 DIAGNOSIS — N3090 Cystitis, unspecified without hematuria: Secondary | ICD-10-CM | POA: Diagnosis not present

## 2019-03-11 DIAGNOSIS — Z944 Liver transplant status: Secondary | ICD-10-CM | POA: Diagnosis not present

## 2019-03-11 DIAGNOSIS — Z981 Arthrodesis status: Secondary | ICD-10-CM

## 2019-03-11 DIAGNOSIS — M109 Gout, unspecified: Secondary | ICD-10-CM | POA: Diagnosis not present

## 2019-03-11 DIAGNOSIS — I129 Hypertensive chronic kidney disease with stage 1 through stage 4 chronic kidney disease, or unspecified chronic kidney disease: Secondary | ICD-10-CM | POA: Diagnosis present

## 2019-03-11 DIAGNOSIS — N400 Enlarged prostate without lower urinary tract symptoms: Secondary | ICD-10-CM | POA: Diagnosis present

## 2019-03-11 DIAGNOSIS — Z841 Family history of disorders of kidney and ureter: Secondary | ICD-10-CM | POA: Diagnosis not present

## 2019-03-11 DIAGNOSIS — I10 Essential (primary) hypertension: Secondary | ICD-10-CM | POA: Diagnosis not present

## 2019-03-11 DIAGNOSIS — B9620 Unspecified Escherichia coli [E. coli] as the cause of diseases classified elsewhere: Secondary | ICD-10-CM | POA: Diagnosis present

## 2019-03-11 DIAGNOSIS — N200 Calculus of kidney: Secondary | ICD-10-CM | POA: Diagnosis present

## 2019-03-11 DIAGNOSIS — Z8249 Family history of ischemic heart disease and other diseases of the circulatory system: Secondary | ICD-10-CM | POA: Diagnosis not present

## 2019-03-11 DIAGNOSIS — Z9049 Acquired absence of other specified parts of digestive tract: Secondary | ICD-10-CM | POA: Diagnosis not present

## 2019-03-11 DIAGNOSIS — Z87440 Personal history of urinary (tract) infections: Secondary | ICD-10-CM | POA: Diagnosis not present

## 2019-03-11 DIAGNOSIS — E876 Hypokalemia: Secondary | ICD-10-CM | POA: Diagnosis present

## 2019-03-11 DIAGNOSIS — Z79899 Other long term (current) drug therapy: Secondary | ICD-10-CM

## 2019-03-11 DIAGNOSIS — Z20828 Contact with and (suspected) exposure to other viral communicable diseases: Secondary | ICD-10-CM | POA: Diagnosis present

## 2019-03-11 DIAGNOSIS — J302 Other seasonal allergic rhinitis: Secondary | ICD-10-CM | POA: Diagnosis not present

## 2019-03-11 DIAGNOSIS — Z03818 Encounter for observation for suspected exposure to other biological agents ruled out: Secondary | ICD-10-CM | POA: Diagnosis not present

## 2019-03-11 DIAGNOSIS — N183 Chronic kidney disease, stage 3 (moderate): Secondary | ICD-10-CM | POA: Diagnosis present

## 2019-03-11 DIAGNOSIS — E860 Dehydration: Secondary | ICD-10-CM | POA: Diagnosis present

## 2019-03-11 LAB — COMPREHENSIVE METABOLIC PANEL
ALT: 33 U/L (ref 0–44)
AST: 20 U/L (ref 15–41)
Albumin: 4.3 g/dL (ref 3.5–5.0)
Alkaline Phosphatase: 61 U/L (ref 38–126)
Anion gap: 10 (ref 5–15)
BUN: 18 mg/dL (ref 8–23)
CO2: 22 mmol/L (ref 22–32)
Calcium: 9.7 mg/dL (ref 8.9–10.3)
Chloride: 107 mmol/L (ref 98–111)
Creatinine, Ser: 1.39 mg/dL — ABNORMAL HIGH (ref 0.61–1.24)
GFR calc Af Amer: >60 mL/min (ref >60)
GFR calc non Af Amer: 54 mL/min — ABNORMAL LOW (ref >60)
Glucose, Bld: 125 mg/dL — ABNORMAL HIGH (ref 70–99)
Potassium: 3.8 mmol/L (ref 3.5–5.1)
Sodium: 139 mmol/L (ref 135–145)
Total Bilirubin: 2.7 mg/dL — ABNORMAL HIGH (ref 0.3–1.2)
Total Protein: 7.4 g/dL (ref 6.5–8.1)

## 2019-03-11 LAB — CBC
HCT: 48.4 % (ref 39.0–52.0)
Hemoglobin: 16.9 g/dL (ref 13.0–17.0)
MCH: 32.6 pg (ref 26.0–34.0)
MCHC: 34.9 g/dL (ref 30.0–36.0)
MCV: 93.3 fL (ref 80.0–100.0)
Platelets: 202 10*3/uL (ref 150–400)
RBC: 5.19 MIL/uL (ref 4.22–5.81)
RDW: 13.8 % (ref 11.5–15.5)
WBC: 18.5 10*3/uL — ABNORMAL HIGH (ref 4.0–10.5)
nRBC: 0 % (ref 0.0–0.2)

## 2019-03-11 LAB — URINALYSIS, COMPLETE (UACMP) WITH MICROSCOPIC
Bilirubin Urine: NEGATIVE
Glucose, UA: NEGATIVE mg/dL
Ketones, ur: NEGATIVE mg/dL
Nitrite: POSITIVE — AB
Protein, ur: NEGATIVE mg/dL
Specific Gravity, Urine: 1.042 — ABNORMAL HIGH (ref 1.005–1.030)
WBC, UA: >50 WBC/hpf — ABNORMAL HIGH (ref 0–5)
pH: 5 (ref 5.0–8.0)

## 2019-03-11 LAB — SARS CORONAVIRUS 2 BY RT PCR (HOSPITAL ORDER, PERFORMED IN ~~LOC~~ HOSPITAL LAB): SARS Coronavirus 2: NEGATIVE

## 2019-03-11 LAB — TROPONIN I (HIGH SENSITIVITY)
Troponin I (High Sensitivity): 10 ng/L (ref <18)
Troponin I (High Sensitivity): 11 ng/L (ref <18)

## 2019-03-11 LAB — LIPASE, BLOOD: Lipase: 22 U/L (ref 11–51)

## 2019-03-11 MED ORDER — SODIUM CHLORIDE 0.9 % IV SOLN
INTRAVENOUS | Status: DC
  Administered 2019-03-11 – 2019-03-13 (×2): via INTRAVENOUS

## 2019-03-11 MED ORDER — TACROLIMUS 0.5 MG PO CAPS
1.0000 mg | ORAL_CAPSULE | Freq: Every morning | ORAL | Status: DC
  Administered 2019-03-12 – 2019-03-14 (×3): 1 mg via ORAL
  Filled 2019-03-11 (×3): qty 2

## 2019-03-11 MED ORDER — OXYCODONE HCL 5 MG PO TABS
5.0000 mg | ORAL_TABLET | ORAL | Status: DC | PRN
  Administered 2019-03-11 – 2019-03-12 (×4): 5 mg via ORAL
  Filled 2019-03-11 (×5): qty 1

## 2019-03-11 MED ORDER — CALCIUM CARBONATE ANTACID 500 MG PO CHEW: 1.0000 | CHEWABLE_TABLET | Freq: Two times a day (BID) | ORAL | Status: DC | PRN

## 2019-03-11 MED ORDER — LABETALOL HCL 5 MG/ML IV SOLN
10.0000 mg | INTRAVENOUS | Status: DC | PRN
  Administered 2019-03-13 (×2): 10 mg via INTRAVENOUS
  Filled 2019-03-11 (×2): qty 4

## 2019-03-11 MED ORDER — ONDANSETRON HCL 4 MG PO TABS: 4.0000 mg | ORAL_TABLET | Freq: Four times a day (QID) | ORAL | Status: DC | PRN

## 2019-03-11 MED ORDER — SODIUM CHLORIDE 0.9 % IV SOLN
2.0000 g | Freq: Once | INTRAVENOUS | Status: AC
  Administered 2019-03-11: 17:00:00 2 g via INTRAVENOUS
  Filled 2019-03-11: qty 20

## 2019-03-11 MED ORDER — KETOROLAC TROMETHAMINE 30 MG/ML IJ SOLN
15.0000 mg | INTRAMUSCULAR | Status: AC
  Administered 2019-03-11: 17:00:00 15 mg via INTRAVENOUS
  Filled 2019-03-11: qty 1

## 2019-03-11 MED ORDER — SODIUM CHLORIDE 0.9 % IV SOLN
1.0000 g | INTRAVENOUS | Status: DC
  Administered 2019-03-12: 17:00:00 1 g via INTRAVENOUS
  Filled 2019-03-11: qty 10
  Filled 2019-03-11: qty 1

## 2019-03-11 MED ORDER — ONDANSETRON HCL 4 MG/2ML IJ SOLN: 4.0000 mg | Freq: Four times a day (QID) | INTRAMUSCULAR | Status: DC | PRN

## 2019-03-11 MED ORDER — PREDNISONE 5 MG PO TABS
5.0000 mg | ORAL_TABLET | Freq: Every day | ORAL | Status: DC
  Administered 2019-03-12 – 2019-03-14 (×3): 5 mg via ORAL
  Filled 2019-03-11 (×3): qty 1

## 2019-03-11 MED ORDER — TAMSULOSIN HCL 0.4 MG PO CAPS
0.4000 mg | ORAL_CAPSULE | Freq: Every day | ORAL | Status: DC
  Administered 2019-03-12 – 2019-03-14 (×3): 0.4 mg via ORAL
  Filled 2019-03-11 (×3): qty 1

## 2019-03-11 MED ORDER — POLYETHYLENE GLYCOL 3350 17 G PO PACK: 17.0000 g | PACK | Freq: Every day | ORAL | Status: DC | PRN

## 2019-03-11 MED ORDER — INFLUENZA VAC SPLIT QUAD 0.5 ML IM SUSY: 0.5000 mL | PREFILLED_SYRINGE | INTRAMUSCULAR | Status: DC

## 2019-03-11 MED ORDER — PANTOPRAZOLE SODIUM 40 MG PO TBEC
40.0000 mg | DELAYED_RELEASE_TABLET | Freq: Every day | ORAL | Status: DC
  Administered 2019-03-12 – 2019-03-14 (×3): 40 mg via ORAL
  Filled 2019-03-11 (×3): qty 1

## 2019-03-11 MED ORDER — MYCOPHENOLATE MOFETIL 250 MG PO CAPS
250.0000 mg | ORAL_CAPSULE | Freq: Every day | ORAL | Status: DC
  Administered 2019-03-11 – 2019-03-13 (×3): 250 mg via ORAL
  Filled 2019-03-11 (×4): qty 1

## 2019-03-11 MED ORDER — LOSARTAN POTASSIUM 50 MG PO TABS
50.0000 mg | ORAL_TABLET | Freq: Every day | ORAL | Status: DC
  Administered 2019-03-11 – 2019-03-12 (×2): 50 mg via ORAL
  Filled 2019-03-11 (×2): qty 1

## 2019-03-11 MED ORDER — IOHEXOL 300 MG/ML  SOLN
100.0000 mL | Freq: Once | INTRAMUSCULAR | Status: AC | PRN
  Administered 2019-03-11: 16:00:00 100 mL via INTRAVENOUS

## 2019-03-11 MED ORDER — TACROLIMUS 0.5 MG PO CAPS
0.5000 mg | ORAL_CAPSULE | Freq: Every day | ORAL | Status: DC
  Administered 2019-03-11 – 2019-03-13 (×3): 0.5 mg via ORAL
  Filled 2019-03-11 (×4): qty 1

## 2019-03-11 MED ORDER — LABETALOL HCL 5 MG/ML IV SOLN
10.0000 mg | Freq: Once | INTRAVENOUS | Status: AC
  Administered 2019-03-11: 20:00:00 10 mg via INTRAVENOUS

## 2019-03-11 MED ORDER — SODIUM CHLORIDE 0.9 % IV BOLUS
1000.0000 mL | Freq: Once | INTRAVENOUS | Status: AC
  Administered 2019-03-11: 17:00:00 1000 mL via INTRAVENOUS

## 2019-03-11 MED ORDER — IBUPROFEN 400 MG PO TABS
200.0000 mg | ORAL_TABLET | Freq: Three times a day (TID) | ORAL | Status: DC | PRN
  Administered 2019-03-12: 06:00:00 200 mg via ORAL
  Filled 2019-03-11: qty 1

## 2019-03-11 MED ORDER — MYCOPHENOLATE MOFETIL 250 MG PO CAPS
500.0000 mg | ORAL_CAPSULE | Freq: Every morning | ORAL | Status: DC
  Administered 2019-03-12 – 2019-03-14 (×3): 500 mg via ORAL
  Filled 2019-03-11 (×3): qty 2

## 2019-03-11 MED ORDER — ENOXAPARIN SODIUM 40 MG/0.4ML ~~LOC~~ SOLN
40.0000 mg | SUBCUTANEOUS | Status: DC
  Administered 2019-03-11 – 2019-03-13 (×3): 40 mg via SUBCUTANEOUS
  Filled 2019-03-11 (×3): qty 0.4

## 2019-03-11 MED ORDER — SODIUM CHLORIDE 0.9% FLUSH
3.0000 mL | Freq: Once | INTRAVENOUS | Status: AC
  Administered 2019-03-11: 17:00:00 3 mL via INTRAVENOUS

## 2019-03-11 NOTE — Progress Notes (Signed)
Family Meeting Note  Advance Directive:no  Today a meeting took place with the Patient.  Patient is able to participate.  The following clinical team members were present during this meeting:MD  The following were discussed:Patient's diagnosis: UTI, Patient's progosis: Unable to determine and Goals for treatment: Full Code  Additional follow-up to be provided: prn  Time spent during discussion:20 minutes  Evette Doffing, MD

## 2019-03-11 NOTE — ED Notes (Signed)
Called lab due to [facility] not letting me print labels for the urine sample and blood cultures. Lab notified that samples will be sent with white pt labels.

## 2019-03-11 NOTE — ED Notes (Signed)
Pt still in pain and requesting something for pain. MD notified.

## 2019-03-11 NOTE — H&P (Signed)
[facility] [place] NAME: Devin White    MR#:  [id number]  DATE OF BIRTH:  [birth year]  DATE OF ADMISSION:  03/11/2019  PRIMARY CARE PHYSICIAN: Venia Carbon, MD   REQUESTING/REFERRING PHYSICIAN: Carrie Mew, MD  CHIEF COMPLAINT:   Chief Complaint  Patient presents with  . Abdominal Pain    HISTORY OF PRESENT ILLNESS:  Devin White  is a 63 y.o. male with a known history of liver transplant, hypertension, gout, GERD, BPH who presented to the ED with fever and left side pain and left abdominal pain that started last night.  Patient endorses nausea and diarrhea for the last 2 weeks.  He then developed "stabbing" left side pain last night.  The pain is worse with sitting up.  He also endorses some dysuria, urinary frequency, urinary urgency, fevers, and chills at home.  He checked his temperature this morning and it was 100.46F, so he came to the ED for further evaluation.  In the ED, he was initially tachycardic to 107, but this resolved with IV fluids.  Labs are significant for creatinine 1.39, WBC 18.5.  UA with small hemoglobin, moderate leukocytes, positive nitrites, many bacteria.  Blood and urine cultures were drawn.  CT abdomen/pelvis was negative for any acute process.  Patient was started on ceftriaxone.  Hospitalists were called for admission.  PAST MEDICAL HISTORY:   Past Medical History:  Diagnosis Date  . Benign prostatic hypertrophy   . GERD (gastroesophageal reflux disease)   . Gout   . History of blood transfusion    pt has antibodies in his blood since previous transfusions  . History of cirrhosis of liver S/P TRANSPLANT 2009  . History of liver failure S/P TRANSPLANT  . Hypertension   . Left ureteral calculus     PAST SURGICAL HISTORY:   Past Surgical History:  Procedure Laterality Date  . bone morrow biopsy    . CHOLECYSTECTOMY  2007  . CYSTO/ LEFT RETROGRADE PYELOGRAM/ LEFT URETERAL STENT  PLACEMENT  03-05-2012  DR Tresa Moore [facility])   LEFT URETERAL CALCULI  . CYSTOSCOPY W/ URETERAL STENT PLACEMENT  03/11/2012   Procedure: CYSTOSCOPY WITH STENT REPLACEMENT;  Surgeon: Alexis Frock, MD;  Location[facility];  Service: Urology;  Laterality: Left;  . HERNIA REPAIR  2008  . LIVER BIOPSY    . LIVER TRANSPLANT  11/24/2007   pt states doing well since liver transplant  . LUMBAR DISC SURGERY    . LUMBAR FUSION    . removal of fistula    . URETEROSCOPY  03/11/2012   Procedure: URETEROSCOPY;  Surgeon: Alexis Frock, MD;  Location: [facility];  Service: Urology;  Laterality: Left;   STONE MANIPULATION, stone obtained  [id number] [employer] MCR    SOCIAL HISTORY:   Social History   Tobacco Use  . Smoking status: Never Smoker  . Smokeless tobacco: Never Used  Substance Use Topics  . Alcohol use: No    FAMILY HISTORY:   Family History  Problem Relation Age of Onset  . Cancer Mother        colon  . Arthritis Mother   . Vasculitis Mother   . Hypertension Mother   . Cancer Father        colon  . Kidney disease Father   . Arthritis Father   . Arthritis Brother   . Alcohol abuse Maternal Aunt   . Diabetes Maternal Aunt   . Alcohol abuse Maternal Uncle   .  Diabetes Paternal Aunt   . Alcohol abuse Maternal Uncle   . Alcohol abuse Maternal Uncle     DRUG ALLERGIES:  No Known Allergies  REVIEW OF SYSTEMS:   Review of Systems  Constitutional: Positive for chills and fever.  HENT: Negative for congestion and sore throat.   Eyes: Negative for blurred vision and double vision.  Respiratory: Negative for cough and shortness of breath.   Cardiovascular: Negative for chest pain and palpitations.  Gastrointestinal: Positive for diarrhea and nausea. Negative for abdominal pain and vomiting.  Genitourinary: Positive for dysuria, flank pain, frequency and urgency.  Musculoskeletal: Negative for back pain and neck pain.  Neurological: Negative for  dizziness and headaches.  Psychiatric/Behavioral: Negative for depression. The patient is not nervous/anxious.     MEDICATIONS AT HOME:   Prior to Admission medications   Medication Sig Start Date End Date Taking? Authorizing Provider  mycophenolate (CELLCEPT) 250 MG capsule Take 250-500 mg by mouth 2 (two) times daily. 2 capsules (500 mg) in the morning and one capsule (250 mg) in the evening 02/04/19  Yes [provider]  omeprazole (PRILOSEC) 20 MG capsule TAKE 1 CAPSULE BY MOUTH TWICE DAILY 03/05/19  Yes Venia Carbon, MD  predniSONE (DELTASONE) 5 MG tablet Take 2 tablets (10 mg total) by mouth daily with breakfast. Patient taking differently: Take 5 mg by mouth daily with breakfast.  08/09/18  Yes Irene Pap N, DO  tacrolimus (PROGRAF) 0.5 MG capsule Take 0.5-1 mg by mouth See admin instructions. Take 2 capsules (1mg ) by mouth every morning and 1 capsule (0.5mg ) by mouth every night   Yes [provider]  tamsulosin (FLOMAX) 0.4 MG CAPS capsule TAKE 1 CAPSULE BY MOUTH EVERY DAY 02/03/19  Yes Viviana Simpler I, MD  amLODipine (NORVASC) 5 MG tablet Take 1 tablet (5 mg total) by mouth daily. Patient not taking: Reported on 12/14/2018 12/05/18   Gladstone Lighter, MD  calcium carbonate (TUMS) 500 MG chewable tablet Chew 1 tablet (200 mg of elemental calcium total) by mouth 2 (two) times daily as needed for indigestion or heartburn. 06/26/16   Tonia Ghent, MD  colchicine 0.6 MG tablet TAKE 1 TABLET BY MOUTH TWICE DAILY AS NEEDED FOR GOUT Patient taking differently: Take 0.6 mg by mouth 2 (two) times daily as needed (acute gout).  10/08/18   Venia Carbon, MD  losartan (COZAAR) 50 MG tablet Take 50 mg by mouth daily. 12/11/18   [provider]  nitroGLYCERIN (NITROSTAT) 0.4 MG SL tablet Place 1 tablet (0.4 mg total) under the tongue every 5 (five) minutes as needed for chest pain. 11/29/15   Wende Bushy, MD      VITAL SIGNS:  Blood pressure (!) 166/97, pulse  75, temperature 98.4 F (36.9 C), temperature source Oral, resp. rate 18, height 6\' 1"  (1.854 m), weight 118.8 kg, SpO2 98 %.  PHYSICAL EXAMINATION:  Physical Exam  GENERAL:  63 y.o.-year-old patient lying in the bed with no acute distress.  EYES: Pupils equal, round, reactive to light and accommodation. No scleral icterus. Extraocular muscles intact.  HEENT: Head atraumatic, normocephalic. Oropharynx and nasopharynx clear.  NECK:  Supple, no jugular venous distention. No thyroid enlargement, no tenderness.  LUNGS: Normal breath sounds bilaterally, no wheezing, rales, rhonchi or crepitation. No use of accessory muscles of respiration.  CARDIOVASCULAR: RRR, S1, S2 normal. No murmurs, rubs, or gallops.  ABDOMEN: Soft, nondistended. Bowel sounds present. No organomegaly or mass. + Left side of the abdomen is diffusely tender to  palpation, no rebound or guarding.+ Abdominal scar present. EXTREMITIES: No pedal edema, cyanosis, or clubbing.  NEUROLOGIC: Cranial nerves II through XII are intact. Muscle strength 5/5 in all extremities. Sensation intact. Gait not checked.  PSYCHIATRIC: The patient is alert and oriented x 3.  SKIN: No obvious rash, lesion, or ulcer.   LABORATORY PANEL:   CBC Recent Labs  Lab 03/11/19 1351  WBC 18.5*  HGB 16.9  HCT 48.4  PLT 202   ------------------------------------------------------------------------------------------------------------------  Chemistries  Recent Labs  Lab 03/11/19 1351  NA 139  K 3.8  CL 107  CO2 22  GLUCOSE 125*  BUN 18  CREATININE 1.39*  CALCIUM 9.7  AST 20  ALT 33  ALKPHOS 61  BILITOT 2.7*   ------------------------------------------------------------------------------------------------------------------  Cardiac Enzymes No results for input(s): TROPONINI in the last 168 hours. ------------------------------------------------------------------------------------------------------------------  RADIOLOGY:  Ct Abdomen  Pelvis W Contrast  Result Date: 03/11/2019 CLINICAL DATA:  Left-sided abdominal pain EXAM: CT ABDOMEN AND PELVIS WITH CONTRAST TECHNIQUE: Multidetector CT imaging of the abdomen and pelvis was performed using the standard protocol following bolus administration of intravenous contrast. CONTRAST:  167mL OMNIPAQUE IOHEXOL 300 MG/ML  SOLN COMPARISON:  December 01, 2018 FINDINGS: Lower chest: There is atelectasis at the lung bases.The heart is enlarged. Hepatobiliary: The patient is status post liver transplant. Status post cholecystectomy.There is no biliary ductal dilation. Pancreas: Normal contours without ductal dilatation. No peripancreatic fluid collection. Spleen: No splenic laceration or hematoma. Adrenals/Urinary Tract: --Adrenal glands: No adrenal hemorrhage. --Right kidney/ureter: There is a punctate nonobstructing stone in lower pole the right kidney. --Left kidney/ureter: There is a punctate nonobstructing stone in the upper pole of the left kidney. --Urinary bladder: Unremarkable. Stomach/Bowel: --Stomach/Duodenum: No hiatal hernia or other gastric abnormality. Normal duodenal course and caliber. --Small bowel: No dilatation or inflammation. --Colon: No focal abnormality. --Appendix: The appendix is dilated measuring approximately 9 mm in diameter. This is relatively similar to prior study. There are no significant periappendiceal inflammatory changes. Vascular/Lymphatic: Atherosclerotic calcification is present within the non-aneurysmal abdominal aorta, without hemodynamically significant stenosis. --No retroperitoneal lymphadenopathy. --No mesenteric lymphadenopathy. --No pelvic or inguinal lymphadenopathy. Reproductive: Unremarkable Other: There is a trace amount of free fluid in the left lower quadrant. This is similar to prior study. The abdominal wall is normal. Musculoskeletal. There are stable postsurgical changes of the lower lumbar spine. IMPRESSION: 1. No acute abdominopelvic process. 2. Bilateral  nonobstructive nephrolithiasis. 3. Status post liver transplant. 4. Trace amount of free fluid in the left lower quadrant, similar to prior study. Aortic Atherosclerosis (ICD10-I70.0). Electronically Signed   By: Constance Holster M.D.   On: 03/11/2019 16:03      IMPRESSION AND PLAN:   Urinary tract infection- UA is consistent with infection and patient is endorsing urinary symptoms.  Not meeting sepsis criteria on admission, although he did have a fever to 100.69F at home.  -Continue ceftriaxone -Follow-up blood and urine cultures -Patient will need to follow-up with urology as an outpatient for recurrent UTIs  Left flank pain- could be related to UTI.  No CVA tenderness to suggest pyelonephritis.  Could also be related to the diarrhea that he was having for the last 2 weeks. -CT abdomen/pelvis was negative for acute abnormalities, but did show bilateral nonobstructive nephrolithiasis -Monitor   History of liver transplant in 2007 on immunosuppressive therapy. -Continue home prednisone, CellCept, Prograf -Avoid hepatotoxic agents  Hypertension- BP high in the ED -Continue home losartan -IV labetalol prn  CKD III-creatinine is at baseline -Avoid nephrotoxic agents -Monitor  BPH -Continue home flomax  All the records are reviewed and case discussed with ED provider. Management plans discussed with the patient, family and they are in agreement.  CODE STATUS: Full  TOTAL TIME TAKING CARE OF THIS PATIENT: 45 minutes.    Berna Spare  M.D on 03/11/2019 at 6:35 PM  Between 7am to 6pm - Pager - [phone]  After 6pm go to [email or web address] - Proofreader  [facility]  Hospitalists  Office  [phone]  CC: Primary care physician; Venia Carbon, MD   Note: This dictation was prepared with Dragon dictation along with smaller phrase technology. Any transcriptional errors that result from this process are unintentional.

## 2019-03-11 NOTE — ED Notes (Signed)
[place]  PER  DR  [id number]

## 2019-03-11 NOTE — ED Triage Notes (Signed)
Pt states he began having LUQ/rib pain last night then again around 0400 this am. States the pain caused him to start sweating, hurts worse with deep inhalation. NAD. Had liver tx 11 years ago in June, called [facility] and they told him to go to nearest hospital right away, states fever began at 11 today with chills, hasn't had tylenol since 0500 this am.

## 2019-03-11 NOTE — ED Provider Notes (Signed)
Procedures  Clinical Course as of Mar 10 1834  Thu Mar 11, 2019  1700 Pt reassessed. Has generalized abd tenderness, nonfocal. Temp rechecked by me, 98.4 F.  With reported fever at home and initial tachycardia, along with finding of leukocytosis, i'll check blood cultures and give a dose of ceftriaxone. Will d/w [facility] med regarding f/u vs in hospital observation. UA pending. Pt is not septic, no fever here in ED, tachycardia resolved with hydration.    [PS]  [id number] UA shows an obvious nitrite positive UTI, consistent with dehydration as well with high specific gravity.  Medical record shows that patient had UTI with E. coli in February of this year and June of this year, both time sensitive to cephalosporins.  Urinalysis, Complete w Microscopic(!) [PS]  1825 Case d/w [facility] TP med    [PS]    Clinical Course User Index [PS] Carrie Mew, MD    ----------------------------------------- 6:35 PM on 03/11/2019 ----------------------------------------- Vital signs normalized, patient is not septic, did transplant med advises no specific concerns regarding the liver transplant, suitable for the patient to be managed here at [facility].  Case discussed with the hospitalist for further management.  Final diagnoses:  Cystitis  Liver transplant recipient [facility])  Dehydration       Carrie Mew, MD 03/11/19 1836

## 2019-03-11 NOTE — ED Provider Notes (Signed)
[facility] Emergency Department Provider Note   ____________________________________________   I have reviewed the triage vital signs and the nursing notes.   HISTORY  Chief Complaint Abdominal Pain   History limited by: Not Limited   HPI Devin White is a 63 y.o. male who presents to the emergency department today because of concerns for left sided abdominal pain.  Patient states the pain started last night.  It has been fairly persistent since then.  He describes it as severe.  Is located all throughout his left abdomen.  He feels like there is a pressure and distention there.  It is worse when he tries to take a deep breath or when he bends over.  Patient had had some diarrhea over the past 2 weeks.  Patient did have fever of 100.4 today.  No nausea or vomiting.  Denies similar symptoms in the past.  Records reviewed. Per medical record review patient has a history of GERD, liver transplant.   Past Medical History:  Diagnosis Date  . Benign prostatic hypertrophy   . GERD (gastroesophageal reflux disease)   . Gout   . History of blood transfusion    pt has antibodies in his blood since previous transfusions  . History of cirrhosis of liver S/P TRANSPLANT 2009  . History of liver failure S/P TRANSPLANT  . Hypertension   . Left ureteral calculus     Patient Active Problem List   Diagnosis Date Noted  . Cystitis 12/01/2018  . UTI (urinary tract infection) 08/05/2018  . Acute pyelonephritis 08/04/2018  . Pulmonary nodule 08/04/2018  . Liver transplant status ([place]) 10/10/2017  . Advance directive discussed with patient 10/10/2017  . Chronic kidney disease, stage III (moderate) ([place]) 10/09/2016  . Mallory-Weiss tear 04/16/2016  . Long-term use of immunosuppressant medication 04/08/2016  . Sleep apnea 10/06/2015  . 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 Hypertension   . Routine general medical examination at  a health care facility 04/26/2011  . Chronic liver failure ([place]) 09/24/2010  . GOUT 12/21/2008  . BPH without urinary obstruction 12/21/2008  . ALLERGIC RHINITIS 05/22/2007  . GERD 05/22/2007  . HIATAL HERNIA 05/22/2007  . IRRITABLE BOWEL SYNDROME, HX OF 05/22/2007  . RENAL CALCULUS, HX OF 05/22/2007    Past Surgical History:  Procedure Laterality Date  . bone morrow biopsy    . CHOLECYSTECTOMY  2007  . CYSTO/ LEFT RETROGRADE PYELOGRAM/ LEFT URETERAL STENT PLACEMENT  03-05-2012  DR Tresa Moore [facility])   LEFT URETERAL CALCULI  . CYSTOSCOPY W/ URETERAL STENT PLACEMENT  03/11/2012   Procedure: CYSTOSCOPY WITH STENT REPLACEMENT;  Surgeon: Alexis Frock, MD;  Location: [facility];  Service: Urology;  Laterality: Left;  . HERNIA REPAIR  2008  . LIVER BIOPSY    . LIVER TRANSPLANT  11/24/2007   pt states doing well since liver transplant  . LUMBAR DISC SURGERY    . LUMBAR FUSION    . removal of fistula    . URETEROSCOPY  03/11/2012   Procedure: URETEROSCOPY;  Surgeon: Alexis Frock, MD;  Location: [facility];  Service: Urology;  Laterality: Left;   STONE MANIPULATION, stone obtained  [place] [facility]    Prior to Admission medications   Medication Sig Start Date End Date Taking? Authorizing Provider  mycophenolate (CELLCEPT) 250 MG capsule Take 250-500 mg by mouth 2 (two) times daily. 2 capsules (500 mg) in the morning and one capsule (250 mg) in the evening 02/04/19  Yes [provider]  amLODipine (NORVASC) 5 MG tablet Take 1 tablet (5 mg total) by mouth daily. Patient not taking: Reported on 12/14/2018 12/05/18   Gladstone Lighter, MD  calcium carbonate (TUMS) 500 MG chewable tablet Chew 1 tablet (200 mg of elemental calcium total) by mouth 2 (two) times daily as needed for indigestion or heartburn. 06/26/16   Tonia Ghent, MD  colchicine 0.6 MG tablet TAKE 1 TABLET BY MOUTH TWICE DAILY AS NEEDED FOR GOUT Patient taking differently: Take 0.6 mg by  mouth 2 (two) times daily as needed (acute gout).  10/08/18   Venia Carbon, MD  losartan (COZAAR) 50 MG tablet Take 50 mg by mouth daily. 12/11/18   [provider]  nitroGLYCERIN (NITROSTAT) 0.4 MG SL tablet Place 1 tablet (0.4 mg total) under the tongue every 5 (five) minutes as needed for chest pain. 11/29/15   Wende Bushy, MD  omeprazole (PRILOSEC) 20 MG capsule TAKE 1 CAPSULE BY MOUTH TWICE DAILY 03/05/19   Venia Carbon, MD  predniSONE (DELTASONE) 5 MG tablet Take 2 tablets (10 mg total) by mouth daily with breakfast. Patient not taking: Reported on 03/11/2019 08/09/18   Kayleen Memos, DO  tacrolimus (PROGRAF) 0.5 MG capsule Take 0.5-1 mg by mouth See admin instructions. Take 2 capsules (1mg ) by mouth every morning and 1 capsule (0.5mg ) by mouth every night    [provider]  tamsulosin (FLOMAX) 0.4 MG CAPS capsule TAKE 1 CAPSULE BY MOUTH EVERY DAY 02/03/19   Venia Carbon, MD    Allergies Patient has no known allergies.  Family History  Problem Relation Age of Onset  . Cancer Mother        colon  . Arthritis Mother   . Vasculitis Mother   . Hypertension Mother   . Cancer Father        colon  . Kidney disease Father   . Arthritis Father   . Arthritis Brother   . Alcohol abuse Maternal Aunt   . Diabetes Maternal Aunt   . Alcohol abuse Maternal Uncle   . Diabetes Paternal Aunt   . Alcohol abuse Maternal Uncle   . Alcohol abuse Maternal Uncle     Social History Social History   Tobacco Use  . Smoking status: Never Smoker  . Smokeless tobacco: Never Used  Substance Use Topics  . Alcohol use: No  . Drug use: No    Review of Systems Constitutional: No fever/chills Eyes: No visual changes. ENT: No sore throat. Cardiovascular: Denies chest pain. Respiratory: Denies shortness of breath. Gastrointestinal: Positive for left sided abdominal pain.  Genitourinary: Negative for dysuria. Musculoskeletal: Negative for back pain. Skin: Negative for  rash. Neurological: Negative for headaches, focal weakness or numbness.  ____________________________________________   PHYSICAL EXAM:  VITAL SIGNS: ED Triage Vitals  Enc Vitals Group     BP --      Pulse Rate 03/11/19 1326 (!) 107     Resp 03/11/19 1326 18     Temp 03/11/19 1326 98.4 F (36.9 C)     Temp Source 03/11/19 1326 Oral     SpO2 03/11/19 1326 96 %     Weight 03/11/19 1337 262 lb (118.8 kg)     Height 03/11/19 1337 6\' 1"  (1.854 m)     Head Circumference --      Peak Flow --      Pain Score 03/11/19 1329 9   Constitutional: Alert and oriented.  Eyes: Conjunctivae are normal.  ENT  Head: Normocephalic and atraumatic.      Nose: No congestion/rhinnorhea.      Mouth/Throat: Mucous membranes are moist.      Neck: No stridor. Hematological/Lymphatic/Immunilogical: No cervical lymphadenopathy. Cardiovascular: Normal rate, regular rhythm.  No murmurs, rubs, or gallops.  Respiratory: Normal respiratory effort without tachypnea nor retractions. Breath sounds are clear and equal bilaterally. No wheezes/rales/rhonchi. Gastrointestinal: Soft and tender to palpation on the left side of the abdomen.  Genitourinary: Deferred Musculoskeletal: Normal range of motion in all extremities. No lower extremity edema. Neurologic:  Normal speech and language. No gross focal neurologic deficits are appreciated.  Skin:  Skin is warm, dry and intact. No rash noted. Psychiatric: Mood and affect are normal. Speech and behavior are normal. Patient exhibits appropriate insight and judgment.  ____________________________________________    LABS (pertinent positives/negatives)  Lipase 22 Trop hs 10 CBC wbc 18.5, hgb 16.9, plt 202 CMP wnl except glu 125, cr 1.39, t bili 2.7  ____________________________________________   EKG  I, Nance Pear, attending physician, personally viewed and interpreted this EKG  EKG Time: 1334 Rate: 112 Rhythm: sinus tachycardia Axis: left axis  deviation Intervals: qtc 477 QRS: LVH ST changes: no st elevation Impression: abnormal ekg   ____________________________________________    RADIOLOGY  None  ____________________________________________   PROCEDURES  Procedures  ____________________________________________   INITIAL IMPRESSION / ASSESSMENT AND PLAN / ED COURSE  Pertinent labs & imaging results that were available during my care of the patient were reviewed by me and considered in my medical decision making (see chart for details).   Patient presented to the emergency department today because of concern for abdominal pain. Patient with history of liver transplant. Does have tenderness to palpation of the left side. The patient blood work with mild leukocytosis. Will get CT abd/pel. Imaging pending at time of sign out.  ____________________________________________   FINAL CLINICAL IMPRESSION(S) / ED DIAGNOSES  Abdominal Pain  Note: This dictation was prepared with Dragon dictation. Any transcriptional errors that result from this process are unintentional     Nance Pear, MD 03/11/19 1557

## 2019-03-11 NOTE — ED Notes (Signed)
.. ED TO INPATIENT HANDOFF REPORT  ED Nurse Name and Phone #: Deneise Lever [phone] Name/Age/Gender Devin White 63 y.o. male Room/Bed: ED04A/ED04A  Code Status   Code Status: Prior  Home/SNF/Other Home Patient oriented to: self, place, time and situation Is this baseline? Yes   Triage Complete: Triage complete  Chief Complaint l side pain  Triage Note Pt states he began having LUQ/rib pain last night then again around 0400 this am. States the pain caused him to start sweating, hurts worse with deep inhalation. NAD. Had liver tx 11 years ago in June, called [facility] and they told him to go to nearest hospital right away, states fever began at 34 today with chills, hasn't had tylenol since 0500 this am.    Allergies No Known Allergies  Level of Care/Admitting Diagnosis ED Disposition    ED Disposition Condition [place]: [place] [100120]  Level of Care: Med-Surg [16]  Covid Evaluation: Asymptomatic Screening Protocol (No Symptoms)  Diagnosis: UTI (urinary tract infectionYN:[id number]  Admitting Physician: Hyman Bible DODD [id number]  Attending Physician: Hyman Bible DODD [id number]  PT Class (Do Not Modify): Observation [104]  PT Acc Code (Do Not Modify): Observation [10022]       B Medical/Surgery History Past Medical History:  Diagnosis Date  . Benign prostatic hypertrophy   . GERD (gastroesophageal reflux disease)   . Gout   . History of blood transfusion    pt has antibodies in his blood since previous transfusions  . History of cirrhosis of liver S/P TRANSPLANT 2009  . History of liver failure S/P TRANSPLANT  . Hypertension   . Left ureteral calculus    Past Surgical History:  Procedure Laterality Date  . bone morrow biopsy    . CHOLECYSTECTOMY  2007  . CYSTO/ LEFT RETROGRADE PYELOGRAM/ LEFT URETERAL STENT PLACEMENT  03-05-2012  DR Tresa Moore [facility])   LEFT URETERAL CALCULI  . CYSTOSCOPY W/ URETERAL STENT PLACEMENT   03/11/2012   Procedure: CYSTOSCOPY WITH STENT REPLACEMENT;  Surgeon: Alexis Frock, MD;  Location: [facility];  Service: Urology;  Laterality: Left;  . HERNIA REPAIR  2008  . LIVER BIOPSY    . LIVER TRANSPLANT  11/24/2007   pt states doing well since liver transplant  . LUMBAR DISC SURGERY    . LUMBAR FUSION    . removal of fistula    . URETEROSCOPY  03/11/2012   Procedure: URETEROSCOPY;  Surgeon: Alexis Frock, MD;  Location: [facility];  Service: Urology;  Laterality: Left;   STONE MANIPULATION, stone obtained  [place] [facility]     A IV Location/Drains/Wounds Patient Lines/Drains/Airways Status   Active Line/Drains/Airways    Name:   Placement date:   Placement time:   Site:   Days:   Peripheral IV 03/11/19 Left Antecubital   03/11/19    1353    Antecubital   less than 1   Peripheral IV 03/11/19 Right Forearm   03/11/19    1707    Forearm   less than 1   Ureteral Drain/Stent Left ureter 6 Fr.   03/11/12    0853    Left ureter   2556   Incision 03/05/12 Perineum   03/05/12    1742     2562   Incision 03/11/12 Perineum Left   03/11/12    0843     2556          Intake/Output Last 24 hours  Intake/Output Summary (Last 24 hours) at 03/11/2019 2028 Last data filed at 03/11/2019 1904 Gross per 24 hour  Intake 1103 ml  Output -  Net 1103 ml    Labs/Imaging Results for orders placed or performed during the hospital encounter of 03/11/19 (from the past 48 hour(s))  Lipase, blood     Status: None   Collection Time: 03/11/19  1:51 PM  Result Value Ref Range   Lipase 22 11 - 51 U/L    Comment: Performed at [facility], [address] [place], [place] [address]  Comprehensive metabolic panel     Status: Abnormal   Collection Time: 03/11/19  1:51 PM  Result Value Ref Range   Sodium 139 135 - 145 mmol/L   Potassium 3.8 3.5 - 5.1 mmol/L   Chloride 107 98 - 111 mmol/L   CO2 22 22 - 32 mmol/L   Glucose, Bld 125 (H) 70 - 99 mg/dL   BUN 18  8 - 23 mg/dL   Creatinine, Ser 1.39 (H) 0.61 - 1.24 mg/dL   Calcium 9.7 8.9 - 10.3 mg/dL   Total Protein 7.4 6.5 - 8.1 g/dL   Albumin 4.3 3.5 - 5.0 g/dL   AST 20 15 - 41 U/L   ALT 33 0 - 44 U/L   Alkaline Phosphatase 61 38 - 126 U/L   Total Bilirubin 2.7 (H) 0.3 - 1.2 mg/dL   GFR calc non Af Amer 54 (L) >60 mL/min   GFR calc Af Amer >60 >60 mL/min   Anion gap 10 5 - 15    Comment: Performed at [facility], [address] [place], [place] [address]  CBC     Status: Abnormal   Collection Time: 03/11/19  1:51 PM  Result Value Ref Range   WBC 18.5 (H) 4.0 - 10.5 K/uL   RBC 5.19 4.22 - 5.81 MIL/uL   Hemoglobin 16.9 13.0 - 17.0 g/dL   HCT 48.4 39.0 - 52.0 %   MCV 93.3 80.0 - 100.0 fL   MCH 32.6 26.0 - 34.0 pg   MCHC 34.9 30.0 - 36.0 g/dL   RDW 13.8 11.5 - 15.5 %   Platelets 202 150 - 400 K/uL   nRBC 0.0 0.0 - 0.2 %    Comment: Performed at [facility], [place], [place] [address]  Troponin I (High Sensitivity)     Status: None   Collection Time: 03/11/19  1:51 PM  Result Value Ref Range   Troponin I (High Sensitivity) 10 <18 ng/L    Comment: (NOTE) Elevated high sensitivity troponin I (hsTnI) values and significant  changes across serial measurements may suggest ACS but many other  chronic and acute conditions are known to elevate hsTnI results.  Refer to the "Links" section for chest pain algorithms and additional  guidance. Performed at [facility], [place], [place] [address]   Troponin I (High Sensitivity)     Status: None   Collection Time: 03/11/19  4:05 PM  Result Value Ref Range   Troponin I (High Sensitivity) 11 <18 ng/L    Comment: (NOTE) Elevated high sensitivity troponin I (hsTnI) values and significant  changes across serial measurements may suggest ACS but many other  chronic and acute conditions are known to elevate hsTnI results.  Refer to the "Links" section for chest pain algorithms and  additional  guidance. Performed at [facility], [address] [place], [place] [address]   Urinalysis, Complete w Microscopic  Status: Abnormal   Collection Time: 03/11/19  5:02 PM  Result Value Ref Range   Color, Urine YELLOW (A) YELLOW   APPearance HAZY (A) CLEAR   Specific Gravity, Urine 1.042 (H) 1.005 - 1.030   pH 5.0 5.0 - 8.0   Glucose, UA NEGATIVE NEGATIVE mg/dL   Hgb urine dipstick SMALL (A) NEGATIVE   Bilirubin Urine NEGATIVE NEGATIVE   Ketones, ur NEGATIVE NEGATIVE mg/dL   Protein, ur NEGATIVE NEGATIVE mg/dL   Nitrite POSITIVE (A) NEGATIVE   Leukocytes,Ua MODERATE (A) NEGATIVE   RBC / HPF 0-5 0 - 5 RBC/hpf   WBC, UA >50 (H) 0 - 5 WBC/hpf   Bacteria, UA MANY (A) NONE SEEN   Squamous Epithelial / LPF 0-5 0 - 5   WBC Clumps PRESENT    Mucus PRESENT     Comment: Performed at [facility], [address], [place], [place] [address]  SARS Coronavirus 2 [facility] order, Performed in [facility] hospital lab) Nasopharyngeal Nasopharyngeal Swab     Status: None   Collection Time: 03/11/19  6:07 PM   Specimen: Nasopharyngeal Swab  Result Value Ref Range   SARS Coronavirus 2 NEGATIVE NEGATIVE    Comment: (NOTE) If result is NEGATIVE SARS-CoV-2 target nucleic acids are NOT DETECTED. The SARS-CoV-2 RNA is generally detectable in upper and lower  respiratory specimens during the acute phase of infection. The lowest  concentration of SARS-CoV-2 viral copies this assay can detect is 250  copies / mL. A negative result does not preclude SARS-CoV-2 infection  and should not be used as the sole basis for treatment or other  patient management decisions.  A negative result may occur with  improper specimen collection / handling, submission of specimen other  than nasopharyngeal swab, presence of viral mutation(s) within the  areas targeted by this assay, and inadequate number of viral copies  (<250 copies / mL). A negative result must be combined with  clinical  observations, patient history, and epidemiological information. If result is POSITIVE SARS-CoV-2 target nucleic acids are DETECTED. The SARS-CoV-2 RNA is generally detectable in upper and lower  respiratory specimens dur ing the acute phase of infection.  Positive  results are indicative of active infection with SARS-CoV-2.  Clinical  correlation with patient history and other diagnostic information is  necessary to determine patient infection status.  Positive results do  not rule out bacterial infection or co-infection with other viruses. If result is PRESUMPTIVE POSTIVE SARS-CoV-2 nucleic acids MAY BE PRESENT.   A presumptive positive result was obtained on the submitted specimen  and confirmed on repeat testing.  While 2019 novel coronavirus  (SARS-CoV-2) nucleic acids may be present in the submitted sample  additional confirmatory testing may be necessary for epidemiological  and / or clinical management purposes  to differentiate between  SARS-CoV-2 and other Sarbecovirus currently known to infect humans.  If clinically indicated additional testing with an alternate test  methodology [phone]) is advised. The SARS-CoV-2 RNA is generally  detectable in upper and lower respiratory sp ecimens during the acute  phase of infection. The expected result is Negative. Fact Sheet for Patients:  StrictlyIdeas.no Fact Sheet for Healthcare Providers: BankingDealers.co.za This test is not yet approved or cleared by the [place] FDA and has been authorized for detection and/or diagnosis of SARS-CoV-2 by FDA under an Emergency Use Authorization (EUA).  This EUA will remain in effect (meaning this test can be used) for the duration of the COVID-19 declaration under Section 564(b)(1) of  the Act, 21 U.S.C. section 360bbb-3(b)(1), unless the authorization is terminated or revoked sooner. Performed at [facility], [place], [place] [address]    Ct Abdomen Pelvis W Contrast  Result Date: 03/11/2019 CLINICAL DATA:  Left-sided abdominal pain EXAM: CT ABDOMEN AND PELVIS WITH CONTRAST TECHNIQUE: Multidetector CT imaging of the abdomen and pelvis was performed using the standard protocol following bolus administration of intravenous contrast. CONTRAST:  150mL OMNIPAQUE IOHEXOL 300 MG/ML  SOLN COMPARISON:  December 01, 2018 FINDINGS: Lower chest: There is atelectasis at the lung bases.The heart is enlarged. Hepatobiliary: The patient is status post liver transplant. Status post cholecystectomy.There is no biliary ductal dilation. Pancreas: Normal contours without ductal dilatation. No peripancreatic fluid collection. Spleen: No splenic laceration or hematoma. Adrenals/Urinary Tract: --Adrenal glands: No adrenal hemorrhage. --Right kidney/ureter: There is a punctate nonobstructing stone in lower pole the right kidney. --Left kidney/ureter: There is a punctate nonobstructing stone in the upper pole of the left kidney. --Urinary bladder: Unremarkable. Stomach/Bowel: --Stomach/Duodenum: No hiatal hernia or other gastric abnormality. Normal duodenal course and caliber. --Small bowel: No dilatation or inflammation. --Colon: No focal abnormality. --Appendix: The appendix is dilated measuring approximately 9 mm in diameter. This is relatively similar to prior study. There are no significant periappendiceal inflammatory changes. Vascular/Lymphatic: Atherosclerotic calcification is present within the non-aneurysmal abdominal aorta, without hemodynamically significant stenosis. --No retroperitoneal lymphadenopathy. --No mesenteric lymphadenopathy. --No pelvic or inguinal lymphadenopathy. Reproductive: Unremarkable Other: There is a trace amount of free fluid in the left lower quadrant. This is similar to prior study. The abdominal wall is normal. Musculoskeletal. There are stable postsurgical changes of the lower lumbar spine. IMPRESSION:  1. No acute abdominopelvic process. 2. Bilateral nonobstructive nephrolithiasis. 3. Status post liver transplant. 4. Trace amount of free fluid in the left lower quadrant, similar to prior study. Aortic Atherosclerosis (ICD10-I70.0). Electronically Signed   By: Constance Holster M.D.   On: 03/11/2019 16:03    Pending Labs Unresulted Labs (From admission, onward)    Start     Ordered   03/11/19 1635  Culture, blood (routine x 2)  BLOOD CULTURE X 2,   STAT     03/11/19 1634   03/11/19 1632  Urine Culture  Add-on,   AD     03/11/19 1631   Signed and Held  Basic metabolic panel  Tomorrow morning,   R     Signed and Held   Signed and Held  CBC  Tomorrow morning,   R     Signed and Held          Vitals/Pain Today's Vitals   03/11/19 1337 03/11/19 1713 03/11/19 1720 03/11/19 2000  BP:  (!) 166/97  (!) 147/97  Pulse:  75  76  Resp:  18    Temp:      TempSrc:      SpO2:  98%  96%  Weight: 118.8 kg     Height: 6\' 1"  (1.854 m)     PainSc:   6      Isolation Precautions No active isolations  Medications Medications  labetalol (NORMODYNE) injection 10 mg (has no administration in time range)  sodium chloride flush (NS) 0.9 % injection 3 mL (3 mLs Intravenous Given 03/11/19 1724)  iohexol (OMNIPAQUE) 300 MG/ML solution 100 mL (100 mLs Intravenous Contrast Given 03/11/19 1543)  sodium chloride 0.9 % bolus 1,000 mL (0 mLs Intravenous Stopped 03/11/19 1904)  ketorolac (TORADOL) 30 MG/ML injection 15 mg (15 mg Intravenous Given 03/11/19 1640)  cefTRIAXone (ROCEPHIN) 2 g in sodium chloride 0.9 % 100 mL IVPB (0 g Intravenous Stopped 03/11/19 1812)    Mobility walks Low fall risk   Focused Assessments Renal Assessment Handoff:          R Recommendations: See Admitting Provider Note  Report given to:   Additional Notes:

## 2019-03-11 NOTE — ED Notes (Signed)
Sunquest label were unable to print. Called lab and notified that the troponin will be sent with a white patient label.

## 2019-03-12 DIAGNOSIS — N179 Acute kidney failure, unspecified: Secondary | ICD-10-CM | POA: Diagnosis present

## 2019-03-12 DIAGNOSIS — Z8249 Family history of ischemic heart disease and other diseases of the circulatory system: Secondary | ICD-10-CM | POA: Diagnosis not present

## 2019-03-12 DIAGNOSIS — Z87440 Personal history of urinary (tract) infections: Secondary | ICD-10-CM | POA: Diagnosis not present

## 2019-03-12 DIAGNOSIS — E876 Hypokalemia: Secondary | ICD-10-CM | POA: Diagnosis present

## 2019-03-12 DIAGNOSIS — K219 Gastro-esophageal reflux disease without esophagitis: Secondary | ICD-10-CM | POA: Diagnosis present

## 2019-03-12 DIAGNOSIS — M109 Gout, unspecified: Secondary | ICD-10-CM | POA: Diagnosis present

## 2019-03-12 DIAGNOSIS — B9620 Unspecified Escherichia coli [E. coli] as the cause of diseases classified elsewhere: Secondary | ICD-10-CM | POA: Diagnosis present

## 2019-03-12 DIAGNOSIS — Z20828 Contact with and (suspected) exposure to other viral communicable diseases: Secondary | ICD-10-CM | POA: Diagnosis present

## 2019-03-12 DIAGNOSIS — N400 Enlarged prostate without lower urinary tract symptoms: Secondary | ICD-10-CM | POA: Diagnosis present

## 2019-03-12 DIAGNOSIS — N3090 Cystitis, unspecified without hematuria: Secondary | ICD-10-CM | POA: Diagnosis present

## 2019-03-12 DIAGNOSIS — I129 Hypertensive chronic kidney disease with stage 1 through stage 4 chronic kidney disease, or unspecified chronic kidney disease: Secondary | ICD-10-CM | POA: Diagnosis present

## 2019-03-12 DIAGNOSIS — E860 Dehydration: Secondary | ICD-10-CM | POA: Diagnosis present

## 2019-03-12 DIAGNOSIS — K567 Ileus, unspecified: Secondary | ICD-10-CM | POA: Diagnosis present

## 2019-03-12 DIAGNOSIS — N200 Calculus of kidney: Secondary | ICD-10-CM | POA: Diagnosis present

## 2019-03-12 DIAGNOSIS — Z79899 Other long term (current) drug therapy: Secondary | ICD-10-CM | POA: Diagnosis not present

## 2019-03-12 DIAGNOSIS — Z841 Family history of disorders of kidney and ureter: Secondary | ICD-10-CM | POA: Diagnosis not present

## 2019-03-12 DIAGNOSIS — Z944 Liver transplant status: Secondary | ICD-10-CM | POA: Diagnosis not present

## 2019-03-12 DIAGNOSIS — Z9049 Acquired absence of other specified parts of digestive tract: Secondary | ICD-10-CM | POA: Diagnosis not present

## 2019-03-12 DIAGNOSIS — N183 Chronic kidney disease, stage 3 (moderate): Secondary | ICD-10-CM | POA: Diagnosis present

## 2019-03-12 DIAGNOSIS — Z981 Arthrodesis status: Secondary | ICD-10-CM | POA: Diagnosis not present

## 2019-03-12 DIAGNOSIS — J302 Other seasonal allergic rhinitis: Secondary | ICD-10-CM | POA: Diagnosis present

## 2019-03-12 LAB — CBC
HCT: 41.3 % (ref 39.0–52.0)
Hemoglobin: 13.6 g/dL (ref 13.0–17.0)
MCH: 32 pg (ref 26.0–34.0)
MCHC: 32.9 g/dL (ref 30.0–36.0)
MCV: 97.2 fL (ref 80.0–100.0)
Platelets: 152 10*3/uL (ref 150–400)
RBC: 4.25 MIL/uL (ref 4.22–5.81)
RDW: 14.1 % (ref 11.5–15.5)
WBC: 9.2 10*3/uL (ref 4.0–10.5)
nRBC: 0 % (ref 0.0–0.2)

## 2019-03-12 LAB — BASIC METABOLIC PANEL
Anion gap: 7 (ref 5–15)
BUN: 17 mg/dL (ref 8–23)
CO2: 26 mmol/L (ref 22–32)
Calcium: 8.4 mg/dL — ABNORMAL LOW (ref 8.9–10.3)
Chloride: 108 mmol/L (ref 98–111)
Creatinine, Ser: 1.53 mg/dL — ABNORMAL HIGH (ref 0.61–1.24)
GFR calc Af Amer: 56 mL/min — ABNORMAL LOW (ref >60)
GFR calc non Af Amer: 48 mL/min — ABNORMAL LOW (ref >60)
Glucose, Bld: 102 mg/dL — ABNORMAL HIGH (ref 70–99)
Potassium: 3.4 mmol/L — ABNORMAL LOW (ref 3.5–5.1)
Sodium: 141 mmol/L (ref 135–145)

## 2019-03-12 MED ORDER — PNEUMOCOCCAL VAC POLYVALENT 25 MCG/0.5ML IJ INJ: 0.5000 mL | INJECTION | INTRAMUSCULAR | Status: DC

## 2019-03-12 MED ORDER — POTASSIUM CHLORIDE CRYS ER 20 MEQ PO TBCR
20.0000 meq | EXTENDED_RELEASE_TABLET | Freq: Once | ORAL | Status: AC
  Administered 2019-03-12: 14:00:00 20 meq via ORAL
  Filled 2019-03-12: qty 1

## 2019-03-12 NOTE — Progress Notes (Signed)
[facility] [place] NAME: Devin White    MR#:  [id number]  DATE OF BIRTH:  1956/02/15  SUBJECTIVE:  CHIEF COMPLAINT:   Chief Complaint  Patient presents with  . Abdominal Pain  c/o left flank pain. He is concerned about recurrent urine infection REVIEW OF SYSTEMS:  Review of Systems  Constitutional: Negative for diaphoresis, fever, malaise/fatigue and weight loss.  HENT: Negative for ear discharge, ear pain, hearing loss, nosebleeds, sore throat and tinnitus.   Eyes: Negative for blurred vision and pain.  Respiratory: Negative for cough, hemoptysis, shortness of breath and wheezing.   Cardiovascular: Negative for chest pain, palpitations, orthopnea and leg swelling.  Gastrointestinal: Negative for abdominal pain, blood in stool, constipation, diarrhea, heartburn, nausea and vomiting.  Genitourinary: Positive for flank pain. Negative for dysuria, frequency and urgency.  Musculoskeletal: Negative for back pain and myalgias.  Skin: Negative for itching and rash.  Neurological: Negative for dizziness, tingling, tremors, focal weakness, seizures, weakness and headaches.  Psychiatric/Behavioral: Negative for depression. The patient is not nervous/anxious.     DRUG ALLERGIES:  No Known Allergies VITALS:  Blood pressure (!) 157/94, pulse 67, temperature 99 F (37.2 C), temperature source Oral, resp. rate 14, height 6\' 1"  (1.854 m), weight 119 kg, SpO2 97 %. PHYSICAL EXAMINATION:  Physical Exam HENT:     Head: Normocephalic and atraumatic.  Eyes:     Conjunctiva/sclera: Conjunctivae normal.     Pupils: Pupils are equal, round, and reactive to light.  Neck:     Musculoskeletal: Normal range of motion and neck supple.     Thyroid: No thyromegaly.     Trachea: No tracheal deviation.  Cardiovascular:     Rate and Rhythm: Normal rate and regular rhythm.     Heart sounds: Normal heart sounds.  Pulmonary:     Effort: Pulmonary effort is  normal. No respiratory distress.     Breath sounds: Normal breath sounds. No wheezing.  Chest:     Chest wall: No tenderness.  Abdominal:     General: Bowel sounds are normal. There is no distension.     Palpations: Abdomen is soft.     Tenderness: There is no abdominal tenderness.  Musculoskeletal: Normal range of motion.  Skin:    General: Skin is warm and dry.     Findings: No rash.  Neurological:     Mental Status: He is alert and oriented to person, place, and time.     Cranial Nerves: No cranial nerve deficit.    LABORATORY PANEL:  Male CBC Recent Labs  Lab 03/12/19 0432  WBC 9.2  HGB 13.6  HCT 41.3  PLT 152   ------------------------------------------------------------------------------------------------------------------ Chemistries  Recent Labs  Lab 03/11/19 1351 03/12/19 0432  NA 139 141  K 3.8 3.4*  CL 107 108  CO2 22 26  GLUCOSE 125* 102*  BUN 18 17  CREATININE 1.39* 1.53*  CALCIUM 9.7 8.4*  AST 20  --   ALT 33  --   ALKPHOS 61  --   BILITOT 2.7*  --    RADIOLOGY:  Ct Abdomen Pelvis W Contrast  Result Date: 03/11/2019 CLINICAL DATA:  Left-sided abdominal pain EXAM: CT ABDOMEN AND PELVIS WITH CONTRAST TECHNIQUE: Multidetector CT imaging of the abdomen and pelvis was performed using the standard protocol following bolus administration of intravenous contrast. CONTRAST:  145mL OMNIPAQUE IOHEXOL 300 MG/ML  SOLN COMPARISON:  December 01, 2018 FINDINGS: Lower chest: There is atelectasis at the lung bases.The  heart is enlarged. Hepatobiliary: The patient is status post liver transplant. Status post cholecystectomy.There is no biliary ductal dilation. Pancreas: Normal contours without ductal dilatation. No peripancreatic fluid collection. Spleen: No splenic laceration or hematoma. Adrenals/Urinary Tract: --Adrenal glands: No adrenal hemorrhage. --Right kidney/ureter: There is a punctate nonobstructing stone in lower pole the right kidney. --Left kidney/ureter: There  is a punctate nonobstructing stone in the upper pole of the left kidney. --Urinary bladder: Unremarkable. Stomach/Bowel: --Stomach/Duodenum: No hiatal hernia or other gastric abnormality. Normal duodenal course and caliber. --Small bowel: No dilatation or inflammation. --Colon: No focal abnormality. --Appendix: The appendix is dilated measuring approximately 9 mm in diameter. This is relatively similar to prior study. There are no significant periappendiceal inflammatory changes. Vascular/Lymphatic: Atherosclerotic calcification is present within the non-aneurysmal abdominal aorta, without hemodynamically significant stenosis. --No retroperitoneal lymphadenopathy. --No mesenteric lymphadenopathy. --No pelvic or inguinal lymphadenopathy. Reproductive: Unremarkable Other: There is a trace amount of free fluid in the left lower quadrant. This is similar to prior study. The abdominal wall is normal. Musculoskeletal. There are stable postsurgical changes of the lower lumbar spine. IMPRESSION: 1. No acute abdominopelvic process. 2. Bilateral nonobstructive nephrolithiasis. 3. Status post liver transplant. 4. Trace amount of free fluid in the left lower quadrant, similar to prior study. Aortic Atherosclerosis (ICD10-I70.0). Electronically Signed   By: Constance Holster M.D.   On: 03/11/2019 16:03   ASSESSMENT AND PLAN:   * Urinary tract infection- UA is consistent with infection and patient is endorsing urinary symptoms.   - Continue ceftriaxone -Follow-up blood and urine cultures -Patient will need to follow-up with urology as an outpatient for recurrent UTIs and non-obstructive kidney stones  * Left flank pain- could be related to UTI vs muscle strain.  No CVA tenderness to suggest pyelonephritis.  Could also be related to the diarrhea that he was having for the last 2 weeks. -CT abdomen/pelvis was negative for acute abnormalities, but did show bilateral nonobstructive nephrolithiasis -Monitor   * History  of liver transplant in 2007 on immunosuppressive therapy. -Continue home prednisone, CellCept, Prograf -Avoid hepatotoxic agents  * Hypertension-  -monitor -IV labetalol prn  * CKD III-creatinine is at baseline -Avoid nephrotoxic agents -Monitor  * BPH -Continue home flomax  * ARF - Creat 1.3->1.5 - hold Losartan, he doesn't take it at home  * Hypokalemia - replete and recheck     All the records are reviewed and case discussed with Care Management/Social Worker. Management plans discussed with the patient, nursing and they are in agreement.  CODE STATUS: Full Code  TOTAL TIME TAKING CARE OF THIS PATIENT: 35 minutes.   More than 50% of the time was spent in counseling/coordination of care: YES  POSSIBLE D/C IN 1-2 DAYS, DEPENDING ON CLINICAL CONDITION.   Max Sane M.D on 03/12/2019 at 1:30 PM  Between 7am to 6pm - Pager - [phone]  After 6pm go to [email or web address] - Proofreader  [facility]  Office  [phone]  CC: Primary care physician; Venia Carbon, MD  Note: This dictation was prepared with Dragon dictation along with smaller phrase technology. Any transcriptional errors that result from this process are unintentional.

## 2019-03-13 ENCOUNTER — Inpatient Hospital Stay: Payer: Medicare Other

## 2019-03-13 LAB — BASIC METABOLIC PANEL
Anion gap: 5 (ref 5–15)
BUN: 15 mg/dL (ref 8–23)
CO2: 27 mmol/L (ref 22–32)
Calcium: 8.5 mg/dL — ABNORMAL LOW (ref 8.9–10.3)
Chloride: 110 mmol/L (ref 98–111)
Creatinine, Ser: 1.42 mg/dL — ABNORMAL HIGH (ref 0.61–1.24)
GFR calc Af Amer: >60 mL/min (ref >60)
GFR calc non Af Amer: 53 mL/min — ABNORMAL LOW (ref >60)
Glucose, Bld: 107 mg/dL — ABNORMAL HIGH (ref 70–99)
Potassium: 3.5 mmol/L (ref 3.5–5.1)
Sodium: 142 mmol/L (ref 135–145)

## 2019-03-13 LAB — CBC
HCT: 40 % (ref 39.0–52.0)
Hemoglobin: 13.2 g/dL (ref 13.0–17.0)
MCH: 32.3 pg (ref 26.0–34.0)
MCHC: 33 g/dL (ref 30.0–36.0)
MCV: 97.8 fL (ref 80.0–100.0)
Platelets: 142 10*3/uL — ABNORMAL LOW (ref 150–400)
RBC: 4.09 MIL/uL — ABNORMAL LOW (ref 4.22–5.81)
RDW: 13.9 % (ref 11.5–15.5)
WBC: 10.2 10*3/uL (ref 4.0–10.5)
nRBC: 0 % (ref 0.0–0.2)

## 2019-03-13 LAB — URINE CULTURE: Culture: >=100000 — AB

## 2019-03-13 MED ORDER — OXYCODONE HCL 5 MG PO TABS: 5.0000 mg | ORAL_TABLET | ORAL | Status: DC | PRN

## 2019-03-13 MED ORDER — HYDROMORPHONE HCL 1 MG/ML IJ SOLN
1.0000 mg | INTRAMUSCULAR | Status: DC | PRN
  Administered 2019-03-13 – 2019-03-14 (×6): 1 mg via INTRAVENOUS
  Filled 2019-03-13 (×6): qty 1

## 2019-03-13 MED ORDER — CIPROFLOXACIN HCL 500 MG PO TABS
500.0000 mg | ORAL_TABLET | Freq: Two times a day (BID) | ORAL | Status: DC
  Administered 2019-03-13 – 2019-03-14 (×2): 500 mg via ORAL
  Filled 2019-03-13 (×2): qty 1

## 2019-03-13 NOTE — Progress Notes (Signed)
[facility] [place] NAME: Devin White    MR#:  [id number]  DATE OF BIRTH:  [birth year]  SUBJECTIVE:  c/o left flank pain. More constant and worsens with positional change. Getting IV pain meds. Able to eat. No vomiting. No fever. Devin White is concerned about recurrent urine infection REVIEW OF SYSTEMS:  Review of Systems  Constitutional: Negative for diaphoresis, fever, malaise/fatigue and weight loss.  HENT: Negative for ear discharge, ear pain, hearing loss, nosebleeds, sore throat and tinnitus.   Eyes: Negative for blurred vision and pain.  Respiratory: Negative for cough, hemoptysis, shortness of breath and wheezing.   Cardiovascular: Negative for chest pain, palpitations, orthopnea and leg swelling.  Gastrointestinal: Negative for abdominal pain, blood in stool, constipation, diarrhea, heartburn, nausea and vomiting.  Genitourinary: Positive for flank pain. Negative for dysuria, frequency and urgency.  Musculoskeletal: Negative for back pain and myalgias.  Skin: Negative for itching and rash.  Neurological: Negative for dizziness, tingling, tremors, focal weakness, seizures, weakness and headaches.  Psychiatric/Behavioral: Negative for depression. The patient is not nervous/anxious.     DRUG ALLERGIES:  No Known Allergies VITALS:  Blood pressure (!) 163/90, pulse 67, temperature 98.8 F (37.1 C), temperature source Oral, resp. rate 20, height 6\' 1"  (1.854 m), weight 119 kg, SpO2 92 %. PHYSICAL EXAMINATION:  Physical Exam HENT:     Head: Normocephalic and atraumatic.  Eyes:     Conjunctiva/sclera: Conjunctivae normal.     Pupils: Pupils are equal, round, and reactive to light.  Neck:     Musculoskeletal: Normal range of motion and neck supple.     Thyroid: No thyromegaly.     Trachea: No tracheal deviation.  Cardiovascular:     Rate and Rhythm: Normal rate and regular rhythm.     Heart sounds: Normal heart sounds.  Pulmonary:    Effort: Pulmonary effort is normal. No respiratory distress.     Breath sounds: Normal breath sounds. No wheezing.  Chest:     Chest wall: No tenderness.  Abdominal:     General: Bowel sounds are normal. There is no distension.     Palpations: Abdomen is soft.     Tenderness: There is no abdominal tenderness.  Musculoskeletal: Normal range of motion.  Skin:    General: Skin is warm and dry.     Findings: No rash.  Neurological:     Mental Status: Devin White is alert and oriented to person, place, and time.     Cranial Nerves: No cranial nerve deficit.    LABORATORY PANEL:  Male CBC Recent Labs  Lab 03/13/19 0425  WBC 10.2  HGB 13.2  HCT 40.0  PLT 142*   ------------------------------------------------------------------------------------------------------------------ Chemistries  Recent Labs  Lab 03/11/19 1351  03/13/19 0425  NA 139   < > 142  K 3.8   < > 3.5  CL 107   < > 110  CO2 22   < > 27  GLUCOSE 125*   < > 107*  BUN 18   < > 15  CREATININE 1.39*   < > 1.42*  CALCIUM 9.7   < > 8.5*  AST 20  --   --   ALT 33  --   --   ALKPHOS 61  --   --   BILITOT 2.7*  --   --    < > = values in this interval not displayed.   RADIOLOGY:  No results found. ASSESSMENT AND PLAN:   *Recurrent urinary  tract infection- UA is consistent with infection and patient is endorsing urinary symptoms.   - Continue ceftriaxone -blood culture negative. Urine culture positive for E. coli -Patient will need to follow-up with urology as an outpatient for recurrent UTIs and non-obstructive kidney stones -patient has urology appointment with [facility] on October 6. -Given his left flank pain all get KUB--- could be musculoskeletal  * Left flank pain- could be related to UTI vs muscle strain.  No CVA tenderness to suggest pyelonephritis.  Could also be related to the diarrhea that Devin White was having for the last 2 weeks. -CT abdomen/pelvis was negative for acute abnormalities, but did show bilateral  nonobstructive nephrolithiasis  * History of liver transplant in 2007 on immunosuppressive therapy. -Continue home prednisone, CellCept, Prograf -Avoid hepatotoxic agents  * Hypertension -IV labetalol prn  * CKD III-creatinine is at baseline -Avoid nephrotoxic agents -Monitor  * BPH -Continue home flomax  * Hypokalemia - replete and recheck  CODE STATUS: Full Code  TOTAL TIME TAKING CARE OF THIS PATIENT: 25 minutes.   More than 50% of the time was spent in counseling/coordination of care: YES  POSSIBLE D/C IN 1-2 DAYS, DEPENDING ON CLINICAL CONDITION.   Fritzi Mandes M.D on 03/13/2019 at 12:49 PM  Between 7am to 6pm - Pager - [phone]  After 6pm go to [email or web address] - Proofreader  [facility]  Office  [phone]  CC: Primary care physician; Venia Carbon, MD  Note: This dictation was prepared with Dragon dictation along with smaller phrase technology. Any transcriptional errors that result from this process are unintentional.

## 2019-03-13 NOTE — Progress Notes (Signed)
MD was notified of x-ray result. New order received.

## 2019-03-14 MED ORDER — CIPROFLOXACIN HCL 500 MG PO TABS: 500.0000 mg | ORAL_TABLET | Freq: Two times a day (BID) | ORAL | 0 refills | Status: DC

## 2019-03-14 MED ORDER — OXYCODONE HCL 5 MG PO TABS: 5.0000 mg | ORAL_TABLET | ORAL | 0 refills | Status: DC | PRN

## 2019-03-14 MED ORDER — INFLUENZA VAC SPLIT QUAD 0.5 ML IM SUSY: 0.5000 mL | PREFILLED_SYRINGE | INTRAMUSCULAR | Status: DC

## 2019-03-14 NOTE — Discharge Summary (Signed)
[facility] NAME: Devin White    MR#:  [id number]  DATE OF BIRTH:  May 11, 1956  DATE OF ADMISSION:  03/11/2019 ADMITTING PHYSICIAN: Sela Hua, MD  DATE OF DISCHARGE: 03/14/2019  PRIMARY CARE PHYSICIAN: Venia Carbon, MD    ADMISSION DIAGNOSIS:  Dehydration [E86.0] Cystitis [N30.90] Liver transplant recipient [facility]) [Z94.4]  DISCHARGE DIAGNOSIS:  Recurrent Cystitis/?prostatitis Nonobstructing bilateral renal stones SECONDARY DIAGNOSIS:   Past Medical History:  Diagnosis Date  . Benign prostatic hypertrophy   . GERD (gastroesophageal reflux disease)   . Gout   . History of blood transfusion    pt has antibodies in his blood since previous transfusions  . History of cirrhosis of liver S/P TRANSPLANT 2009  . History of liver failure S/P TRANSPLANT  . Hypertension   . Left ureteral calculus     HOSPITAL COURSE:  *Recurrent urinary tract infection-UA is consistent with infection and patient is endorsing urinary symptoms.  - Continue ceftriaxone--changes to ciprofloxacin for 10 more days -blood culture negative. Urine culture positive for E. coli -Patient will need to follow-up with urology as an outpatient for recurrent UTIs and non-obstructive kidney stones and ?prostatitis -patient has urology appointment with [facility] on October 6. -Given his left flank pain all get KUB---showed focal ileus. Pt eating well. Has had reg BM  * Left flank pain-could be related to UTI vs muscle strain.No CVA tenderness to suggest pyelonephritis.Could also be related to the diarrhea that he washaving for the last 2 weeks. -CT abdomen/pelvis was negative for acute abnormalities,but did show bilateral nonobstructive nephrolithiasis  * History of liver transplant in 2007 on immunosuppressive therapy. -Continue home prednisone, CellCept, Prograf -Avoid hepatotoxic agents  * Hypertension--per pt was taken off norvasc and  losartan by PCP -IV labetalol prn  * CKD III-creatinine is at baseline -Avoid nephrotoxic agents -Monitor  * BPH -Continue home flomax  D/c home with out pt urology f/u and PCP appt CONSULTS OBTAINED:    DRUG ALLERGIES:  No Known Allergies  DISCHARGE MEDICATIONS:   Allergies as of 03/14/2019   No Known Allergies     Medication List    STOP taking these medications   amLODipine 5 MG tablet Commonly known as: NORVASC   losartan 50 MG tablet Commonly known as: COZAAR   nitroGLYCERIN 0.4 MG SL tablet Commonly known as: NITROSTAT     TAKE these medications   calcium carbonate 500 MG chewable tablet Commonly known as: Tums Chew 1 tablet (200 mg of elemental calcium total) by mouth 2 (two) times daily as needed for indigestion or heartburn.   ciprofloxacin 500 MG tablet Commonly known as: CIPRO Take 1 tablet (500 mg total) by mouth 2 (two) times daily.   colchicine 0.6 MG tablet TAKE 1 TABLET BY MOUTH TWICE DAILY AS NEEDED FOR GOUT What changed: See the new instructions.   mycophenolate 250 MG capsule Commonly known as: CELLCEPT Take 250-500 mg by mouth 2 (two) times daily. 2 capsules (500 mg) in the morning and one capsule (250 mg) in the evening   omeprazole 20 MG capsule Commonly known as: PRILOSEC TAKE 1 CAPSULE BY MOUTH TWICE DAILY   oxyCODONE 5 MG immediate release tablet Commonly known as: Oxy IR/ROXICODONE Take 1 tablet (5 mg total) by mouth every 4 (four) hours as needed for severe pain or breakthrough pain.   predniSONE 5 MG tablet Commonly known as: DELTASONE Take 2 tablets (10 mg total) by mouth daily with breakfast. What  changed: how much to take   tacrolimus 0.5 MG capsule Commonly known as: PROGRAF Take 0.5-1 mg by mouth See admin instructions. Take 2 capsules (1mg ) by mouth every morning and 1 capsule (0.5mg ) by mouth every night   tamsulosin 0.4 MG Caps capsule Commonly known as: FLOMAX TAKE 1 CAPSULE BY MOUTH EVERY DAY       If  you experience worsening of your admission symptoms, develop shortness of breath, life threatening emergency, suicidal or homicidal thoughts you must seek medical attention immediately by calling 911 or calling your MD immediately  if symptoms less severe.  You Must read complete instructions/literature along with all the possible adverse reactions/side effects for all the Medicines you take and that have been prescribed to you. Take any new Medicines after you have completely understood and accept all the possible adverse reactions/side effects.   Please note  You were cared for by a hospitalist during your hospital stay. If you have any questions about your discharge medications or the care you received while you were in the hospital after you are discharged, you can call the unit and asked to speak with the hospitalist on call if the hospitalist that took care of you is not available. Once you are discharged, your primary care physician will handle any further medical issues. Please note that NO REFILLS for any discharge medications will be authorized once you are discharged, as it is imperative that you return to your primary care physician (or establish a relationship with a primary care physician if you do not have one) for your aftercare needs so that they can reassess your need for medications and monitor your lab values. Today   SUBJECTIVE   Some left flank pain. Ate good BF  VITAL SIGNS:  Blood pressure (!) 153/97, pulse (!) 59, temperature 98.1 F (36.7 C), temperature source Oral, resp. rate 14, height 6\' 1"  (1.854 m), weight 119 kg, SpO2 96 %.  I/O:    Intake/Output Summary (Last 24 hours) at 03/14/2019 1012 Last data filed at 03/14/2019 1010 Gross per 24 hour  Intake 1371.48 ml  Output 500 ml  Net 871.48 ml    PHYSICAL EXAMINATION:  GENERAL:  63 y.o.-year-old patient lying in the bed with no acute distress. obese EYES: Pupils equal, round, reactive to light and  accommodation. No scleral icterus. Extraocular muscles intact.  HEENT: Head atraumatic, normocephalic. Oropharynx and nasopharynx clear.  NECK:  Supple, no jugular venous distention. No thyroid enlargement, no tenderness.  LUNGS: Normal breath sounds bilaterally, no wheezing, rales,rhonchi or crepitation. No use of accessory muscles of respiration.  CARDIOVASCULAR: S1, S2 normal. No murmurs, rubs, or gallops.  ABDOMEN: Soft, non-tender, non-distended. Bowel sounds present. No organomegaly or mass.  EXTREMITIES: No pedal edema, cyanosis, or clubbing.  NEUROLOGIC: Cranial nerves II through XII are intact. Muscle strength 5/5 in all extremities. Sensation intact. Gait not checked.  PSYCHIATRIC: The patient is alert and oriented x 3.  SKIN: No obvious rash, lesion, or ulcer.   DATA REVIEW:   CBC  Recent Labs  Lab 03/13/19 0425  WBC 10.2  HGB 13.2  HCT 40.0  PLT 142*    Chemistries  Recent Labs  Lab 03/11/19 1351  03/13/19 0425  NA 139   < > 142  K 3.8   < > 3.5  CL 107   < > 110  CO2 22   < > 27  GLUCOSE 125*   < > 107*  BUN 18   < > 15  CREATININE 1.39*   < > 1.42*  CALCIUM 9.7   < > 8.5*  AST 20  --   --   ALT 33  --   --   ALKPHOS 61  --   --   BILITOT 2.7*  --   --    < > = values in this interval not displayed.    Microbiology Results   Recent Results (from the past 240 hour(s))  Urine Culture     Status: Abnormal   Collection Time: 03/11/19  5:02 PM   Specimen: Urine, Random  Result Value Ref Range Status   Specimen Description   Final    URINE, RANDOM Performed at [facility], [address] [place], [place] [address]    Special Requests   Final    NONE Performed at [facility], [address] [place], [place] [address]    Culture >=100,000 COLONIES/mL ESCHERICHIA COLI (A)  Final   Report Status 03/13/2019 FINAL  Final   Organism ID, Bacteria ESCHERICHIA COLI (A)  Final      Susceptibility   Escherichia coli - MIC*     AMPICILLIN >=32 RESISTANT Resistant     CEFAZOLIN <=4 SENSITIVE Sensitive     CEFTRIAXONE <=1 SENSITIVE Sensitive     CIPROFLOXACIN <=0.25 SENSITIVE Sensitive     GENTAMICIN <=1 SENSITIVE Sensitive     IMIPENEM <=0.25 SENSITIVE Sensitive     NITROFURANTOIN <=16 SENSITIVE Sensitive     TRIMETH/SULFA >=320 RESISTANT Resistant     AMPICILLIN/SULBACTAM >=32 RESISTANT Resistant     PIP/TAZO <=4 SENSITIVE Sensitive     Extended ESBL NEGATIVE Sensitive     * >=100,000 COLONIES/mL ESCHERICHIA COLI  Culture, blood (routine x 2)     Status: None (Preliminary result)   Collection Time: 03/11/19  5:02 PM   Specimen: BLOOD  Result Value Ref Range Status   Specimen Description BLOOD BLOOD LEFT HAND  Final   Special Requests   Final    BOTTLES DRAWN AEROBIC AND ANAEROBIC Blood Culture adequate volume   Culture   Final    NO GROWTH 3 DAYS Performed at [facility], [address] [place], [place] [address]    Report Status PENDING  Incomplete  Culture, blood (routine x 2)     Status: None (Preliminary result)   Collection Time: 03/11/19  5:08 PM   Specimen: BLOOD  Result Value Ref Range Status   Specimen Description BLOOD FOREARM  Final   Special Requests   Final    BOTTLES DRAWN AEROBIC AND ANAEROBIC Blood Culture results may not be optimal due to an inadequate volume of blood received in culture bottles   Culture   Final    NO GROWTH 3 DAYS Performed at [facility], [address], [place], [place] [address]    Report Status PENDING  Incomplete  SARS Coronavirus 2 [facility] order, Performed in [place] hospital lab) Nasopharyngeal Nasopharyngeal Swab     Status: None   Collection Time: 03/11/19  6:07 PM   Specimen: Nasopharyngeal Swab  Result Value Ref Range Status   SARS Coronavirus 2 NEGATIVE NEGATIVE Final    Comment: (NOTE) If result is NEGATIVE SARS-CoV-2 target nucleic acids are NOT DETECTED. The SARS-CoV-2 RNA is generally detectable in upper and lower   respiratory specimens during the acute phase of infection. The lowest  concentration of SARS-CoV-2 viral copies this assay can detect is 250  copies / mL. A negative result does not preclude SARS-CoV-2 infection  and should not be used as the sole basis for treatment or other  patient management decisions.  A negative result may occur with  improper specimen collection / handling, submission of specimen other  than nasopharyngeal swab, presence of viral mutation(s) within the  areas targeted by this assay, and inadequate number of viral copies  (<250 copies / mL). A negative result must be combined with clinical  observations, patient history, and epidemiological information. If result is POSITIVE SARS-CoV-2 target nucleic acids are DETECTED. The SARS-CoV-2 RNA is generally detectable in upper and lower  respiratory specimens dur ing the acute phase of infection.  Positive  results are indicative of active infection with SARS-CoV-2.  Clinical  correlation with patient history and other diagnostic information is  necessary to determine patient infection status.  Positive results do  not rule out bacterial infection or co-infection with other viruses. If result is PRESUMPTIVE POSTIVE SARS-CoV-2 nucleic acids MAY BE PRESENT.   A presumptive positive result was obtained on the submitted specimen  and confirmed on repeat testing.  While 2019 novel coronavirus  (SARS-CoV-2) nucleic acids may be present in the submitted sample  additional confirmatory testing may be necessary for epidemiological  and / or clinical management purposes  to differentiate between  SARS-CoV-2 and other Sarbecovirus currently known to infect humans.  If clinically indicated additional testing with an alternate test  methodology [phone]) is advised. The SARS-CoV-2 RNA is generally  detectable in upper and lower respiratory sp ecimens during the acute  phase of infection. The expected result is Negative. Fact  Sheet for Patients:  StrictlyIdeas.no Fact Sheet for Healthcare Providers: BankingDealers.co.za This test is not yet approved or cleared by the [place] FDA and has been authorized for detection and/or diagnosis of SARS-CoV-2 by FDA under an Emergency Use Authorization (EUA).  This EUA will remain in effect (meaning this test can be used) for the duration of the COVID-19 declaration under Section 564(b)(1) of the Act, 21 U.S.C. section 360bbb-3(b)(1), unless the authorization is terminated or revoked sooner. Performed at [facility], [address] [place], [place] [address]     RADIOLOGY:  Bea Graff 1 View  Result Date: 03/13/2019 CLINICAL DATA:  LEFT-sided abdominal pain EXAM: ABDOMEN - 1 VIEW COMPARISON:  CT 03/11/2019 FINDINGS: Prominent loops of small bowel in the LEFT abdomen measure up to 4.2 cm. No dilated loops colon. Gas throughout the colon and rectum. No intraperitoneal free air. IMPRESSION: Mildly distended small bowel in the LEFT abdomen could represent focal ileus. No high-grade obstruction. Findings similar to CT 2 days prior. Electronically Signed   By: Suzy Bouchard M.D.   On: 03/13/2019 13:55     CODE STATUS:     Code Status Orders  (From admission, onward)         Start     Ordered   03/11/19 2057  Full code  Continuous     03/11/19 2056        Code Status History    Date Active Date Inactive Code Status Order ID Comments User Context   12/01/2018 1535 12/04/2018 2004 Full Code [id number]  Fritzi Mandes, MD Inpatient   08/04/2018 2208 08/08/2018 1956 Full Code [id number]  Etta Quill, DO ED   Advance Care Planning Activity      TOTAL TIME TAKING CARE OF THIS PATIENT: *40* minutes.    Fritzi Mandes M.D on 03/14/2019 at 10:12 AM  Between 7am to 6pm - Pager - [phone] After 6pm go to [email or web address] -  password [email or web address] [facility]  Office  [phone]  CC: Primary care  physician; Venia Carbon, MD

## 2019-03-14 NOTE — Plan of Care (Signed)
Discharge order received. Patient mental status is at baseline. Vital signs stable . No signs of acute distress. Discharge instructions given. Patient verbalized understanding. No other issues noted at this time.   

## 2019-03-14 NOTE — Discharge Instructions (Signed)
Keep your appt with [facility] for October 6 th

## 2019-03-15 ENCOUNTER — Telehealth: Payer: Self-pay

## 2019-03-15 NOTE — Telephone Encounter (Signed)
LM on patient's VM (okay per DPR) to please call back for hospital f/u call and to set up f/u appointment.

## 2019-03-16 LAB — CULTURE, BLOOD (ROUTINE X 2)
Culture: NO GROWTH
Culture: NO GROWTH
Special Requests: ADEQUATE

## 2019-03-16 NOTE — Telephone Encounter (Signed)
Okay to put him in a 15 minute slot if necessary

## 2019-03-16 NOTE — Telephone Encounter (Signed)
Please review patient concern section and appointment section  Transition Care Management Follow-up Telephone Call   Date discharged? 03/14/2019   How have you been since you were released from the hospital? Spoke with patient. Patient states that he is a little better but not a lot. Still having hot flashes, chills and sweats. Pain with urination some and frequency. He is taking Cipro daily. Took Oxycodone once since been home. Has an appointment with [facility] on 03/23/2019.   Do you understand why you were in the hospital? yes   Do you understand the discharge instructions? yes   Where were you discharged to? home   Items Reviewed:  Medications reviewed: yes  Allergies reviewed: yes  Dietary changes reviewed: yes  Referrals reviewed: yes-Has an appointment with [facility] on 03/23/2019.   Functional Questionnaire:   Activities of Daily Living (ADLs):   He states they are independent in the following: States they require assistance with the following:    Any transportation issues/concerns?: No   Any patient concerns? Spoke with patient. Patient states that he is a little better but not a lot. Still having hot flashes, chills and sweats. Pain with urination some and frequency. He is taking Cipro daily. Took Oxycodone once since been home. Has an appointment with [facility] on 03/23/2019. Patient states he is having a lot of urinary frequency but he is pushing more fluids. He was told this was good at the hospital that he was peeing more.    Patient was also concerned that he only received 2 bags of IV antibiotics and usually gets 3 or 4 bags. He said one nurse told him he would get a 3rd bag but then a different nurse came in and started him on oral antibiotics. Patient is wondering if that had any effect of him not feeling much better.  Confirmed importance and date/time of follow-up visits scheduled Not at this time. I do not see 30 minute slot open. When can  patient be scheduled? He is not available on 10/6 and 10/7. Thank you.  Confirmed with patient if condition begins to worsen call PCP or go to the ER.  Patient was given the office number and encouraged to call back with question or concerns.  : yes

## 2019-03-16 NOTE — Telephone Encounter (Signed)
I left a message on patient's voice mail to schedule f/u/.

## 2019-03-23 DIAGNOSIS — N200 Calculus of kidney: Secondary | ICD-10-CM | POA: Diagnosis not present

## 2019-03-23 DIAGNOSIS — N390 Urinary tract infection, site not specified: Secondary | ICD-10-CM | POA: Diagnosis not present

## 2019-03-23 DIAGNOSIS — Z23 Encounter for immunization: Secondary | ICD-10-CM | POA: Diagnosis not present

## 2019-03-26 ENCOUNTER — Ambulatory Visit (INDEPENDENT_AMBULATORY_CARE_PROVIDER_SITE_OTHER): Payer: Medicare Other | Admitting: Internal Medicine

## 2019-03-26 ENCOUNTER — Encounter: Payer: Self-pay | Admitting: Internal Medicine

## 2019-03-26 ENCOUNTER — Other Ambulatory Visit: Payer: Self-pay

## 2019-03-26 VITALS — BP 132/84 | HR 67 | Temp 98.5°F | Ht 73.0 in | Wt 265.0 lb

## 2019-03-26 DIAGNOSIS — Z23 Encounter for immunization: Secondary | ICD-10-CM | POA: Diagnosis not present

## 2019-03-26 DIAGNOSIS — N390 Urinary tract infection, site not specified: Secondary | ICD-10-CM | POA: Diagnosis not present

## 2019-03-26 NOTE — Assessment & Plan Note (Signed)
3 serious spells in past 8 months Symptoms mostly gone now Seen by urology---post void residual checked---no retention Started on prophylaxis (?trimethoprim) Will get cysto soon

## 2019-03-26 NOTE — Progress Notes (Signed)
Subjective:    Patient ID: Devin White, male    DOB: 1955-11-04, 63 y.o.   MRN: [id number]  HPI Here for hospital follow up Reviewed hospital records He is feeling better now  Back in [place] got back pain Had UTI then June had similar symptoms--bad back pan and bent over This past time--had left flank pain (under ribs)---again had UTI Does get some dysuria with each of these infections Has to push to start, etc No hematuria Did have some fever this time  Did go to [facility]rology Has follow up visit for cystoscopy set up Known non obstructive stones  Finished the cipro Urologist started daily preventative (not sure if trimethoprim or nitrofurantoin)  Current Outpatient Medications on File Prior to Visit  Medication Sig Dispense Refill  . calcium carbonate (TUMS) 500 MG chewable tablet Chew 1 tablet (200 mg of elemental calcium total) by mouth 2 (two) times daily as needed for indigestion or heartburn. 180 tablet 1  . ciprofloxacin (CIPRO) 500 MG tablet Take 1 tablet (500 mg total) by mouth 2 (two) times daily. 20 tablet 0  . colchicine 0.6 MG tablet TAKE 1 TABLET BY MOUTH TWICE DAILY AS NEEDED FOR GOUT (Patient taking differently: Take 0.6 mg by mouth 2 (two) times daily as needed (acute gout). ) 30 tablet 2  . mycophenolate (CELLCEPT) 250 MG capsule Take 250-500 mg by mouth 2 (two) times daily. 2 capsules (500 mg) in the morning and one capsule (250 mg) in the evening    . omeprazole (PRILOSEC) 20 MG capsule TAKE 1 CAPSULE BY MOUTH TWICE DAILY 180 capsule 3  . oxyCODONE (OXY IR/ROXICODONE) 5 MG immediate release tablet Take 1 tablet (5 mg total) by mouth every 4 (four) hours as needed for severe pain or breakthrough pain. 15 tablet 0  . predniSONE (DELTASONE) 5 MG tablet Take 2 tablets (10 mg total) by mouth daily with breakfast. (Patient taking differently: Take 5 mg by mouth daily with breakfast. ) 30 tablet 0  . tacrolimus (PROGRAF) 0.5 MG capsule Take 0.5-1 mg by  mouth See admin instructions. Take 2 capsules (1mg ) by mouth every morning and 1 capsule (0.5mg ) by mouth every night    . tamsulosin (FLOMAX) 0.4 MG CAPS capsule TAKE 1 CAPSULE BY MOUTH EVERY DAY 90 capsule 0   No current facility-administered medications on file prior to visit.     No Known Allergies  Past Medical History:  Diagnosis Date  . Benign prostatic hypertrophy   . GERD (gastroesophageal reflux disease)   . Gout   . History of blood transfusion    pt has antibodies in his blood since previous transfusions  . History of cirrhosis of liver S/P TRANSPLANT 2009  . History of liver failure S/P TRANSPLANT  . Hypertension   . Left ureteral calculus     Past Surgical History:  Procedure Laterality Date  . bone morrow biopsy    . CHOLECYSTECTOMY  2007  . CYSTO/ LEFT RETROGRADE PYELOGRAM/ LEFT URETERAL STENT PLACEMENT  03-05-2012  DR Tresa Moore [facility])   LEFT URETERAL CALCULI  . CYSTOSCOPY W/ URETERAL STENT PLACEMENT  03/11/2012   Procedure: CYSTOSCOPY WITH STENT REPLACEMENT;  Surgeon: Alexis Frock, MD;  [facility];  Service: Urology;  Laterality: Left;  . HERNIA REPAIR  2008  . LIVER BIOPSY    . LIVER TRANSPLANT  11/24/2007   pt states doing well since liver transplant  . LUMBAR DISC SURGERY    . LUMBAR FUSION    .  removal of fistula    . URETEROSCOPY  03/11/2012   Procedure: URETEROSCOPY;  Surgeon: Alexis Frock, MD;  Location: [facility];  Service: Urology;  Laterality: Left;   STONE MANIPULATION, stone obtained  [facility]    Family History  Problem Relation Age of Onset  . Cancer Mother        colon  . Arthritis Mother   . Vasculitis Mother   . Hypertension Mother   . Cancer Father        colon  . Kidney disease Father   . Arthritis Father   . Arthritis Brother   . Alcohol abuse Maternal Aunt   . Diabetes Maternal Aunt   . Alcohol abuse Maternal Uncle   . Diabetes Paternal Aunt   . Alcohol abuse Maternal Uncle    . Alcohol abuse Maternal Uncle     Social History   Socioeconomic History  . Marital status: Divorced    Spouse name: Not on file  . Number of children: 3  . Years of education: Not on file  . Highest education level: Not on file  Occupational History  . Occupation: disabled    Employer: RETIRED  Social Needs  . Financial resource strain: Not on file  . Food insecurity    Worry: Not on file    Inability: Not on file  . Transportation needs    Medical: Not on file    Non-medical: Not on file  Tobacco Use  . Smoking status: Never Smoker  . Smokeless tobacco: Never Used  Substance and Sexual Activity  . Alcohol use: No  . Drug use: No  . Sexual activity: Not on file  Lifestyle  . Physical activity    Days per week: Not on file    Minutes per session: Not on file  . Stress: Not on file  Relationships  . Social Herbalist on phone: Not on file    Gets together: Not on file    Attends religious service: Not on file    Active member of club or organization: Not on file    Attends meetings of clubs or organizations: Not on file    Relationship status: Not on file  . Intimate partner violence    Fear of current or ex partner: Not on file    Emotionally abused: Not on file    Physically abused: Not on file    Forced sexual activity: Not on file  Other Topics Concern  . Not on file  Social History Narrative   Has living will   Requests daughter [name] as his health care POA   Would accept brief attempt at resuscitation but no prolonged ventilation   No tube feeds if cognitively unaware   Review of Systems  Appetite is okay No N/V Did have diarrhea before this past hospital stay--back to normal now      Objective:   Physical Exam  Constitutional: He appears well-developed. No distress.  Neck: No thyromegaly present.  Respiratory: Effort normal and breath sounds normal. No respiratory distress. He has no wheezes. He has no rales.  Still with slight  tenderness under 12th rib in left mid axillary line  GI: Soft. He exhibits no distension. There is no abdominal tenderness. There is no rebound and no guarding.  Slightly sensitive below umbilicus  Musculoskeletal:        General: No edema.  Lymphadenopathy:    He has no cervical adenopathy.  Psychiatric: He has a  normal mood and affect. His behavior is normal.           Assessment & Plan:

## 2019-03-26 NOTE — Addendum Note (Signed)
Addended by: Pilar Grammes on: 03/26/2019 10:06 AM   Modules accepted: Orders

## 2019-04-27 ENCOUNTER — Ambulatory Visit: Payer: Medicare Other | Admitting: Internal Medicine

## 2019-05-04 ENCOUNTER — Other Ambulatory Visit: Payer: Self-pay | Admitting: Internal Medicine

## 2019-05-16 ENCOUNTER — Other Ambulatory Visit: Payer: Self-pay | Admitting: Internal Medicine

## 2019-08-11 ENCOUNTER — Emergency Department: Payer: Medicare Other

## 2019-08-11 ENCOUNTER — Emergency Department
Admission: EM | Admit: 2019-08-11 | Discharge: 2019-08-12 | Disposition: A | Discharge status: Home / Self Care | Payer: Medicare Other | Attending: Student | Admitting: Student

## 2019-08-11 ENCOUNTER — Other Ambulatory Visit: Payer: Self-pay

## 2019-08-11 DIAGNOSIS — Z79899 Other long term (current) drug therapy: Secondary | ICD-10-CM | POA: Diagnosis not present

## 2019-08-11 DIAGNOSIS — R0789 Other chest pain: Secondary | ICD-10-CM | POA: Diagnosis not present

## 2019-08-11 DIAGNOSIS — R102 Pelvic and perineal pain: Secondary | ICD-10-CM | POA: Diagnosis present

## 2019-08-11 DIAGNOSIS — M5441 Lumbago with sciatica, right side: Secondary | ICD-10-CM | POA: Diagnosis not present

## 2019-08-11 DIAGNOSIS — Z944 Liver transplant status: Secondary | ICD-10-CM | POA: Diagnosis not present

## 2019-08-11 DIAGNOSIS — I129 Hypertensive chronic kidney disease with stage 1 through stage 4 chronic kidney disease, or unspecified chronic kidney disease: Secondary | ICD-10-CM | POA: Insufficient documentation

## 2019-08-11 DIAGNOSIS — R39198 Other difficulties with micturition: Secondary | ICD-10-CM | POA: Diagnosis not present

## 2019-08-11 DIAGNOSIS — Z20822 Contact with and (suspected) exposure to covid-19: Secondary | ICD-10-CM | POA: Insufficient documentation

## 2019-08-11 DIAGNOSIS — N183 Chronic kidney disease, stage 3 unspecified: Secondary | ICD-10-CM | POA: Diagnosis not present

## 2019-08-11 DIAGNOSIS — M47816 Spondylosis without myelopathy or radiculopathy, lumbar region: Secondary | ICD-10-CM | POA: Diagnosis not present

## 2019-08-11 DIAGNOSIS — M5126 Other intervertebral disc displacement, lumbar region: Secondary | ICD-10-CM | POA: Diagnosis not present

## 2019-08-11 DIAGNOSIS — R079 Chest pain, unspecified: Secondary | ICD-10-CM | POA: Diagnosis not present

## 2019-08-11 DIAGNOSIS — N200 Calculus of kidney: Secondary | ICD-10-CM | POA: Diagnosis not present

## 2019-08-11 LAB — CBC WITH DIFFERENTIAL/PLATELET
Abs Immature Granulocytes: 0.05 10*3/uL (ref 0.00–0.07)
Basophils Absolute: 0 10*3/uL (ref 0.0–0.1)
Basophils Relative: 0 %
Eosinophils Absolute: 0 10*3/uL (ref 0.0–0.5)
Eosinophils Relative: 0 %
HCT: 46.3 % (ref 39.0–52.0)
Hemoglobin: 15.8 g/dL (ref 13.0–17.0)
Immature Granulocytes: 1 %
Lymphocytes Relative: 14 %
Lymphs Abs: 1.3 10*3/uL (ref 0.7–4.0)
MCH: 32.2 pg (ref 26.0–34.0)
MCHC: 34.1 g/dL (ref 30.0–36.0)
MCV: 94.5 fL (ref 80.0–100.0)
Monocytes Absolute: 0.8 10*3/uL (ref 0.1–1.0)
Monocytes Relative: 8 %
Neutro Abs: 7 10*3/uL (ref 1.7–7.7)
Neutrophils Relative %: 77 %
Platelets: 164 10*3/uL (ref 150–400)
RBC: 4.9 MIL/uL (ref 4.22–5.81)
RDW: 13.3 % (ref 11.5–15.5)
WBC: 9.1 10*3/uL (ref 4.0–10.5)
nRBC: 0 % (ref 0.0–0.2)

## 2019-08-11 LAB — BASIC METABOLIC PANEL
Anion gap: 9 (ref 5–15)
BUN: 21 mg/dL (ref 8–23)
CO2: 21 mmol/L — ABNORMAL LOW (ref 22–32)
Calcium: 8.7 mg/dL — ABNORMAL LOW (ref 8.9–10.3)
Chloride: 109 mmol/L (ref 98–111)
Creatinine, Ser: 1.75 mg/dL — ABNORMAL HIGH (ref 0.61–1.24)
GFR calc Af Amer: 48 mL/min — ABNORMAL LOW (ref >60)
GFR calc non Af Amer: 41 mL/min — ABNORMAL LOW (ref >60)
Glucose, Bld: 115 mg/dL — ABNORMAL HIGH (ref 70–99)
Potassium: 4.2 mmol/L (ref 3.5–5.1)
Sodium: 139 mmol/L (ref 135–145)

## 2019-08-11 LAB — URINALYSIS, COMPLETE (UACMP) WITH MICROSCOPIC
Bacteria, UA: NONE SEEN
Bilirubin Urine: NEGATIVE
Glucose, UA: NEGATIVE mg/dL
Hgb urine dipstick: NEGATIVE
Ketones, ur: NEGATIVE mg/dL
Leukocytes,Ua: NEGATIVE
Nitrite: NEGATIVE
Protein, ur: NEGATIVE mg/dL
Specific Gravity, Urine: 1.023 (ref 1.005–1.030)
pH: 5 (ref 5.0–8.0)

## 2019-08-11 LAB — TROPONIN I (HIGH SENSITIVITY)
Troponin I (High Sensitivity): 14 ng/L (ref <18)
Troponin I (High Sensitivity): 15 ng/L (ref <18)

## 2019-08-11 MED ORDER — SODIUM CHLORIDE 0.9 % IV BOLUS
500.0000 mL | Freq: Once | INTRAVENOUS | Status: AC
  Administered 2019-08-11: 19:00:00 500 mL via INTRAVENOUS

## 2019-08-11 MED ORDER — CEPHALEXIN 500 MG PO CAPS: 500.0000 mg | ORAL_CAPSULE | Freq: Two times a day (BID) | ORAL | 0 refills | Status: AC

## 2019-08-11 MED ORDER — GADOBUTROL 1 MMOL/ML IV SOLN
10.0000 mL | Freq: Once | INTRAVENOUS | Status: AC | PRN
  Administered 2019-08-11: 23:00:00 10 mL via INTRAVENOUS

## 2019-08-11 MED ORDER — OXYCODONE HCL 5 MG PO TABS: 5.0000 mg | ORAL_TABLET | Freq: Four times a day (QID) | ORAL | 0 refills | Status: DC | PRN

## 2019-08-11 MED ORDER — OXYCODONE HCL 5 MG PO TABS: 5.0000 mg | ORAL_TABLET | ORAL | 0 refills | Status: DC | PRN

## 2019-08-11 NOTE — ED Notes (Signed)
Pt awaiting CT scan and MRI, no distress noted.

## 2019-08-11 NOTE — ED Notes (Signed)
Pt to MRI, report given to [employer]. Pt moved to room 15.

## 2019-08-11 NOTE — ED Triage Notes (Signed)
Pt to the er for bilateral kidney and groin pain. Pt has a hx of kidney and bladder infections. Pt has seen a urologist and was placed on antibiotics. Pt states this is the 2nd infection since December and the 4th in the last year. Pt states he also has a sinus infection.

## 2019-08-11 NOTE — ED Provider Notes (Signed)
[facility] Emergency Department Provider Note  ____________________________________________   First MD Initiated Contact with Patient 08/11/19 1749     (approximate)  I have reviewed the triage vital signs and the nursing notes.  History  Chief Complaint Back Pain    HPI Devin White is a 64 y.o. male with hx of BPH, nephrolithiasis, UTI, CKD, s/p liver transplant who presents to the ED with concern for kidney infection. Patient states over the last several days he has had bilateral lower back pain, suprapubic pain, dysuria, hesitancy, urgency. This feels similar to prior UTI/kidney infections that he has had previously. He reports some nausea, no vomiting. No fevers. Also states he has developed new left sided chest discomfort today, feels like cramping in his chest wall muscles, and radiates/"pulls" down his left arm and hand as well. No hx of similar symptoms. Currently moderate in severity. No alleviating/aggravating components.   Reports compliance with his transplant medication.   Past Medical Hx Past Medical History:  Diagnosis Date  . Benign prostatic hypertrophy   . GERD (gastroesophageal reflux disease)   . Gout   . History of blood transfusion    pt has antibodies in his blood since previous transfusions  . History of cirrhosis of liver S/P TRANSPLANT 2009  . History of liver failure S/P TRANSPLANT  . Hypertension   . Left ureteral calculus     Problem List Patient Active Problem List   Diagnosis Date Noted  . Recurrent UTI 03/26/2019  . Cystitis 12/01/2018  . UTI (urinary tract infection) 08/05/2018  . Acute pyelonephritis 08/04/2018  . Pulmonary nodule 08/04/2018  . Liver transplant status ([place]) 10/10/2017  . Advance directive discussed with patient 10/10/2017  . Chronic kidney disease, stage III (moderate) ([place]) 10/09/2016  . Mallory-Weiss tear 04/16/2016  . Long-term use of immunosuppressant medication 04/08/2016  . Sleep  apnea 10/06/2015  . 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 Hypertension   . Routine general medical examination at a health care facility 04/26/2011  . Chronic liver failure ([place]) 09/24/2010  . GOUT 12/21/2008  . BPH without urinary obstruction 12/21/2008  . ALLERGIC RHINITIS 05/22/2007  . GERD 05/22/2007  . HIATAL HERNIA 05/22/2007  . IRRITABLE BOWEL SYNDROME, HX OF 05/22/2007  . RENAL CALCULUS, HX OF 05/22/2007    Past Surgical Hx Past Surgical History:  Procedure Laterality Date  . bone morrow biopsy    . CHOLECYSTECTOMY  2007  . CYSTO/ LEFT RETROGRADE PYELOGRAM/ LEFT URETERAL STENT PLACEMENT  03-05-2012  DR Tresa Moore [facility])   LEFT URETERAL CALCULI  . CYSTOSCOPY W/ URETERAL STENT PLACEMENT  03/11/2012   Procedure: CYSTOSCOPY WITH STENT REPLACEMENT;  Surgeon: Alexis Frock, MD;  Location: [facility];  Service: Urology;  Laterality: Left;  . HERNIA REPAIR  2008  . LIVER BIOPSY    . LIVER TRANSPLANT  11/24/2007   pt states doing well since liver transplant  . LUMBAR DISC SURGERY    . LUMBAR FUSION    . removal of fistula    . URETEROSCOPY  03/11/2012   Procedure: URETEROSCOPY;  Surgeon: Alexis Frock, MD;  Location: [facility];  Service: Urology;  Laterality: Left;   STONE MANIPULATION, stone obtained  [place] MCR    Medications Prior to Admission medications   Medication Sig Start Date End Date Taking? Authorizing Provider  calcium carbonate (TUMS) 500 MG chewable tablet Chew 1 tablet (200 mg of elemental calcium total) by mouth 2 (two) times  daily as needed for indigestion or heartburn. 06/26/16   Tonia Ghent, MD  ciprofloxacin (CIPRO) 500 MG tablet Take 1 tablet (500 mg total) by mouth 2 (two) times daily. 03/14/19   Fritzi Mandes, MD  colchicine 0.6 MG tablet TAKE 1 TABLET BY MOUTH TWICE DAILY AS NEEDED FOR GOUT 05/17/19   Venia Carbon, MD  mycophenolate (CELLCEPT) 250 MG capsule Take 250-500 mg  by mouth 2 (two) times daily. 2 capsules (500 mg) in the morning and one capsule (250 mg) in the evening 02/04/19   [provider]  omeprazole (PRILOSEC) 20 MG capsule TAKE 1 CAPSULE BY MOUTH TWICE DAILY 03/05/19   Venia Carbon, MD  oxyCODONE (OXY IR/ROXICODONE) 5 MG immediate release tablet Take 1 tablet (5 mg total) by mouth every 4 (four) hours as needed for severe pain or breakthrough pain. 03/14/19   Fritzi Mandes, MD  predniSONE (DELTASONE) 5 MG tablet Take 2 tablets (10 mg total) by mouth daily with breakfast. Patient taking differently: Take 5 mg by mouth daily with breakfast.  08/09/18   Kayleen Memos, DO  tacrolimus (PROGRAF) 0.5 MG capsule Take 0.5-1 mg by mouth See admin instructions. Take 2 capsules (1mg ) by mouth every morning and 1 capsule (0.5mg ) by mouth every night    [provider]  tamsulosin (FLOMAX) 0.4 MG CAPS capsule TAKE 1 CAPSULE BY MOUTH EVERY DAY 05/04/19   Venia Carbon, MD    Allergies Patient has no known allergies.  Family Hx Family History  Problem Relation Age of Onset  . Cancer Mother        colon  . Arthritis Mother   . Vasculitis Mother   . Hypertension Mother   . Cancer Father        colon  . Kidney disease Father   . Arthritis Father   . Arthritis Brother   . Alcohol abuse Maternal Aunt   . Diabetes Maternal Aunt   . Alcohol abuse Maternal Uncle   . Diabetes Paternal Aunt   . Alcohol abuse Maternal Uncle   . Alcohol abuse Maternal Uncle     Social Hx Social History   Tobacco Use  . Smoking status: Never Smoker  . Smokeless tobacco: Never Used  Substance Use Topics  . Alcohol use: No  . Drug use: No     Review of Systems  Constitutional: Negative for fever, chills. Eyes: Negative for visual changes. ENT: Negative for sore throat. Cardiovascular: + for chest pain. Respiratory: Negative for shortness of breath. Gastrointestinal: Negative for nausea, vomiting.  Genitourinary: + for dysuria, urgency,  hesitancy. Musculoskeletal: Negative for leg swelling. Skin: Negative for rash. Neurological: Negative for headaches.   Physical Exam  Vital Signs: ED Triage Vitals  Enc Vitals Group     BP 08/11/19 1716 (!) 136/112     Pulse Rate 08/11/19 1716 87     Resp 08/11/19 1716 18     Temp 08/11/19 1716 97.6 F (36.4 C)     Temp Source 08/11/19 1716 Oral     SpO2 08/11/19 1716 99 %     Weight 08/11/19 1719 265 lb (120.2 kg)     Height 08/11/19 1719 6\' 1"  (1.854 m)     Head Circumference --      Peak Flow --      Pain Score 08/11/19 1718 8     Pain Loc --      Pain Edu? --      Excl. in [place]? --  Constitutional: Alert and oriented.  Head: Normocephalic. Atraumatic. Eyes: Conjunctivae clear. Sclera anicteric. Nose: No congestion. No rhinorrhea. Mouth/Throat: Wearing mask.  Neck: No stridor.   Cardiovascular: Normal rate, regular rhythm. Extremities well perfused. Respiratory: Normal respiratory effort.  Lungs CTAB. Gastrointestinal: Soft. Non-tender. Non-distended.  Musculoskeletal: No lower extremity edema. No deformities. Mild lower lumbar paraspinal muscle tenderness bilaterally. Pain reproducible with SLR, R > L. Neurologic: RLE strength 4+/5, largely pain limited. LLE strength 5/5. Reports slightly decreased sensation to RLE compared to L.  Skin: Skin is warm, dry and intact. No rash noted. Psychiatric: Mood and affect are appropriate for situation.  EKG  Personally reviewed.   Rate: 61 Rhythm: sinus Axis: LAD Intervals: WNL TWI III No STEMI    Radiology  CT Renal:  IMPRESSION:  1. No acute abnormality identified in the abdomen or pelvis.  2. Bilateral nonobstructing nephrolithiasis.  3. Status post liver transplant with unchanged trace intraperitoneal  free fluid.  4. Aortic Atherosclerosis (ICD10-I70.0).    Procedures  Procedure(s) performed (including critical care):  Procedures   Initial Impression / Assessment and Plan / ED Course  64 y.o.  male who presents to the ED with concern for urine infection/kidney infection, as well as chest cramping.  Ddx with regards to low back and urinary symptoms: UTI, pyelonephritis, nephrolithiasis, MSK, BPH, sciatica  Ddx with regards to chest cramping sensation: ACS, electrolyte abnormality, MSK  Will evaluate with labs, EKG, imaging.   CT negative for obstructing nephrolithiasis. Urine negative for infection. Bladder scan 115 mL after attempted urination, Foley catheter placed.  Patient now reporting that he feels like his BLE are weaker than normal R>L with some tingling of the R leg as well. Feels like this started within last 1-2 days along with the start of his difficulty urinating.    Suspect presentation most consistent with sciatica + BPH, however given constellation of symptoms, will obtain an MRI to r/o cauda equina or other cord pathology (low suspicion for infection, but is immunosuppressed in setting of liver transplant). If negative, will plan for discharge with Foley in place and urology follow up.    Final Clinical Impression(s) / ED Diagnosis  Final diagnoses:  Acute bilateral low back pain with right-sided sciatica  Difficulty urinating       Note:  This document was prepared using Dragon voice recognition software and may include unintentional dictation errors.   Lilia Pro., MD 08/11/19 [phone]

## 2019-08-11 NOTE — ED Notes (Signed)
Patient attempted to stand and urinate. States he feels like he could go, however unable to start stream. States he would like to try again in a little while before having a catheter done.

## 2019-08-12 LAB — SARS CORONAVIRUS 2 (TAT 6-24 HRS): SARS Coronavirus 2: NEGATIVE

## 2019-08-12 NOTE — ED Provider Notes (Signed)
-----------------------------------------   12:01 AM on 08/12/2019 -----------------------------------------  I took over care of this patient from Dr. Joan Mayans.  The patient was awaiting the results of an MRI of the spine.  It is negative for canal or foraminal stenosis or other acute findings.  On reassessment, the patient is comfortable.  The plan was to discharge home if the MRI was negative.  The patient reports to me that he is having all of the symptoms that he has had at the onset of prior UTIs and kidney infections, including dysuria, hesitancy, back pain, and nausea.  He is fairly insistent that he is having the pain as of infection.  Overall I suspect most likely retention due to BPH, however given the patient's strong symptomatology and based on shared decision-making with the patient I feel it is reasonable to treat empirically with a course of antibiotics.  I reviewed urine cultures from September, the patient had E. coli sensitive to cephalosporins.  I instructed the patient to follow-up with his regular urologist.  Return precautions given, and he expresses understanding.   Arta Silence, MD 08/12/19 [phone]

## 2019-08-12 NOTE — Discharge Instructions (Signed)
Call tomorrow to make an appointment to follow-up with your urologist as soon as possible.  You may take the oxycodone as needed for pain, and you should take the antibiotic and finish the full 7-day course.  Return to the ER for new, worsening, or persistent severe pain, problems with the catheter, inability to pass urine, weakness or numbness, or any other new or worsening symptoms that concern you.

## 2019-08-12 NOTE — ED Notes (Signed)
lg bag placed on pt, pt taught how to care for leg bag, pt confirms understanding.

## 2019-08-18 ENCOUNTER — Telehealth: Payer: Self-pay

## 2019-08-18 DIAGNOSIS — Z944 Liver transplant status: Secondary | ICD-10-CM | POA: Diagnosis not present

## 2019-08-18 DIAGNOSIS — R030 Elevated blood-pressure reading, without diagnosis of hypertension: Secondary | ICD-10-CM | POA: Diagnosis not present

## 2019-08-18 DIAGNOSIS — D849 Immunodeficiency, unspecified: Secondary | ICD-10-CM | POA: Diagnosis not present

## 2019-08-18 NOTE — Telephone Encounter (Signed)
Urinalysis was normal--but they treated him empirically at his request        Left message on Vm per DPR to see how he was doing after recent ER visit.

## 2019-08-19 DIAGNOSIS — M545 Low back pain: Secondary | ICD-10-CM | POA: Diagnosis not present

## 2019-08-19 DIAGNOSIS — N200 Calculus of kidney: Secondary | ICD-10-CM | POA: Diagnosis not present

## 2019-08-19 DIAGNOSIS — Z79899 Other long term (current) drug therapy: Secondary | ICD-10-CM | POA: Diagnosis not present

## 2019-08-19 DIAGNOSIS — Z7952 Long term (current) use of systemic steroids: Secondary | ICD-10-CM | POA: Diagnosis not present

## 2019-08-19 DIAGNOSIS — N390 Urinary tract infection, site not specified: Secondary | ICD-10-CM | POA: Diagnosis not present

## 2019-08-19 DIAGNOSIS — Z944 Liver transplant status: Secondary | ICD-10-CM | POA: Diagnosis not present

## 2019-08-19 DIAGNOSIS — M549 Dorsalgia, unspecified: Secondary | ICD-10-CM | POA: Diagnosis not present

## 2019-08-19 DIAGNOSIS — G8929 Other chronic pain: Secondary | ICD-10-CM | POA: Diagnosis not present

## 2019-08-19 DIAGNOSIS — B9620 Unspecified Escherichia coli [E. coli] as the cause of diseases classified elsewhere: Secondary | ICD-10-CM | POA: Diagnosis not present

## 2019-08-19 DIAGNOSIS — R339 Retention of urine, unspecified: Secondary | ICD-10-CM | POA: Diagnosis not present

## 2019-08-19 NOTE — Telephone Encounter (Signed)
Patient called today  He stated that when he went to the ER they placed a catheter Today he had an appointment with Urology at [facility] and they removed the catheter He stated that they tried having him drink plenty of water and still was not able to urinte Urology advised him to continue drinking fluids and believe the patient may be dehydrated . They advised that he may need to follow up with his PCP .  Patient also stated he went to his liver doctor and his bp was slightly elevated and they also wanted him to follow up with Dr Silvio Pate.   Do you believe this is something he should see someone else for before Dr Silvio Pate returns?   Please advise

## 2019-08-19 NOTE — Telephone Encounter (Addendum)
Spoke to pt. He said he was able to void just a few minutes ago. Michela Pitcher it was a good amount. They did labs earlier today. Urology had told him to go home to see if he could void.  They suggested he see his PCP because of his elevated BP yesterday. Today it was better.   He felt like his biggest issue is Low Back/Flank Pain. Both of his specialists think it is muscle related as he has had multiple scans and labs done od his kidneys. I made him an appt 08-27-19. If he thinks he needs to be seen sooner, he will call back.

## 2019-08-20 NOTE — Telephone Encounter (Signed)
That sounds fine. The elevated BP may be related to all that was going on

## 2019-08-27 ENCOUNTER — Ambulatory Visit: Payer: Medicare Other | Admitting: Internal Medicine

## 2019-08-27 DIAGNOSIS — Z0289 Encounter for other administrative examinations: Secondary | ICD-10-CM

## 2019-08-30 ENCOUNTER — Other Ambulatory Visit: Payer: Self-pay

## 2019-08-30 ENCOUNTER — Telehealth: Payer: Self-pay | Admitting: Internal Medicine

## 2019-08-30 MED ORDER — TAMSULOSIN HCL 0.4 MG PO CAPS: 0.4000 mg | ORAL_CAPSULE | Freq: Every day | ORAL | 0 refills | Status: DC

## 2019-08-30 NOTE — Progress Notes (Signed)
  Chronic Care Management   Outreach Note  08/30/2019 Name: Devin White MRN: [id number] DOB: 04-26-1956  Referred by: Venia Carbon, MD Reason for referral : No chief complaint on file.   An unsuccessful telephone outreach was attempted today. The patient was referred to the pharmacist for assistance with care management and care coordination.   Follow Up Plan:   Raynicia Dukes [employer] Scheduler

## 2019-08-30 NOTE — Progress Notes (Signed)
  Chronic Care Management   Outreach Note  08/30/2019 Name: Devin White MRN: [id number] DOB: Jan 10, 1956  Referred by: Venia Carbon, MD Reason for referral : No chief complaint on file.   A second unsuccessful telephone outreach was attempted today. The patient was referred to pharmacist for assistance with care management and care coordination.  Follow Up Plan:   Raynicia Dukes [employer] Scheduler

## 2019-09-29 ENCOUNTER — Other Ambulatory Visit: Payer: Self-pay

## 2019-09-29 MED ORDER — COLCHICINE 0.6 MG PO TABS: 0.6000 mg | ORAL_TABLET | Freq: Two times a day (BID) | ORAL | 0 refills | Status: DC | PRN

## 2019-10-31 ENCOUNTER — Other Ambulatory Visit: Payer: Self-pay | Admitting: Internal Medicine

## 2019-12-09 ENCOUNTER — Other Ambulatory Visit: Payer: Self-pay

## 2019-12-09 MED ORDER — TAMSULOSIN HCL 0.4 MG PO CAPS: 0.4000 mg | ORAL_CAPSULE | Freq: Every day | ORAL | 0 refills | Status: DC

## 2019-12-29 ENCOUNTER — Ambulatory Visit (INDEPENDENT_AMBULATORY_CARE_PROVIDER_SITE_OTHER): Payer: Medicare Other | Admitting: Internal Medicine

## 2019-12-29 ENCOUNTER — Encounter: Payer: Self-pay | Admitting: Internal Medicine

## 2019-12-29 ENCOUNTER — Other Ambulatory Visit: Payer: Self-pay

## 2019-12-29 DIAGNOSIS — S335XXA Sprain of ligaments of lumbar spine, initial encounter: Secondary | ICD-10-CM | POA: Diagnosis not present

## 2019-12-29 MED ORDER — TIZANIDINE HCL 2 MG PO TABS: 2.0000 mg | ORAL_TABLET | Freq: Three times a day (TID) | ORAL | 1 refills | Status: DC | PRN

## 2019-12-29 NOTE — Assessment & Plan Note (Signed)
Clearly seems to be muscular--though some radiation to left leg and some weakness Advised to avoid aleve Okay to use voltaren gel topical Tylenol Heat  Tizanidine prn

## 2019-12-29 NOTE — Progress Notes (Signed)
Subjective:    Patient ID: Devin White, male    DOB: Nov 13, 1955, 64 y.o.   MRN: [id number]  HPI Here due to back pain This visit occurred during the SARS-CoV-2 public health emergency.  Safety protocols were in place, including screening questions prior to the visit, additional usage of staff PPE, and extensive cleaning of exam room while observing appropriate contact time as indicated for disinfecting solutions.   Started with back pain---at first worried about kidney stones Noticed some pain through all the infections and stones Has noted pain anytime he bends over--like doing dishes  4 days ago---was bending over to replace lawn mower battery Hit with pain in both sides in lumbar area Feels like he pulled something Hard to lift left leg--right okay Legs feel somewhat weak ---esp left  Has tried aleve and tylenol--not really helping  Current Outpatient Medications on File Prior to Visit  Medication Sig Dispense Refill  . calcium carbonate (TUMS) 500 MG chewable tablet Chew 1 tablet (200 mg of elemental calcium total) by mouth 2 (two) times daily as needed for indigestion or heartburn. 180 tablet 1  . colchicine 0.6 MG tablet Take 1 tablet (0.6 mg total) by mouth 2 (two) times daily as needed. 30 tablet 0  . mycophenolate (CELLCEPT) 250 MG capsule Take 250-500 mg by mouth 2 (two) times daily. 2 capsules (500 mg) in the morning and one capsule (250 mg) in the evening    . omeprazole (PRILOSEC) 20 MG capsule TAKE 1 CAPSULE BY MOUTH TWICE DAILY 180 capsule 3  . predniSONE (DELTASONE) 5 MG tablet Take 2 tablets (10 mg total) by mouth daily with breakfast. (Patient taking differently: Take 5 mg by mouth daily with breakfast. ) 30 tablet 0  . tacrolimus (PROGRAF) 0.5 MG capsule Take 0.5-1 mg by mouth See admin instructions. Take 2 capsules (1mg ) by mouth every morning and 1 capsule (0.5mg ) by mouth every night    . tamsulosin (FLOMAX) 0.4 MG CAPS capsule Take 1 capsule (0.4 mg total) by  mouth daily. TAKE 1 CAPSULE(0.4 MG) BY MOUTH DAILY 90 capsule 0  . oxyCODONE (ROXICODONE) 5 MG immediate release tablet Take 1 tablet (5 mg total) by mouth every 6 (six) hours as needed for severe pain. (Patient not taking: Reported on 12/29/2019) 15 tablet 0   No current facility-administered medications on file prior to visit.    No Known Allergies  Past Medical History:  Diagnosis Date  . Benign prostatic hypertrophy   . GERD (gastroesophageal reflux disease)   . Gout   . History of blood transfusion    pt has antibodies in his blood since previous transfusions  . History of cirrhosis of liver S/P TRANSPLANT 2009  . History of liver failure S/P TRANSPLANT  . Hypertension   . Left ureteral calculus     Past Surgical History:  Procedure Laterality Date  . bone morrow biopsy    . CHOLECYSTECTOMY  2007  . CYSTO/ LEFT RETROGRADE PYELOGRAM/ LEFT URETERAL STENT PLACEMENT  03-05-2012  DR Tresa Moore [facility])   LEFT URETERAL CALCULI  . CYSTOSCOPY W/ URETERAL STENT PLACEMENT  03/11/2012   Procedure: CYSTOSCOPY WITH STENT REPLACEMENT;  Surgeon: Alexis Frock, MD;  Location: [facility];  Service: Urology;  Laterality: Left;  . HERNIA REPAIR  2008  . LIVER BIOPSY    . LIVER TRANSPLANT  11/24/2007   pt states doing well since liver transplant  . LUMBAR DISC SURGERY    . LUMBAR FUSION    .  removal of fistula    . URETEROSCOPY  03/11/2012   Procedure: URETEROSCOPY;  Surgeon: Alexis Frock, MD;  Location: [facility];  Service: Urology;  Laterality: Left;   STONE MANIPULATION, stone obtained  [place] MCR    Family History  Problem Relation Age of Onset  . Cancer Mother        colon  . Arthritis Mother   . Vasculitis Mother   . Hypertension Mother   . Cancer Father        colon  . Kidney disease Father   . Arthritis Father   . Arthritis Brother   . Alcohol abuse Maternal Aunt   . Diabetes Maternal Aunt   . Alcohol abuse Maternal Uncle   . Diabetes  Paternal Aunt   . Alcohol abuse Maternal Uncle   . Alcohol abuse Maternal Uncle     Social History   Socioeconomic History  . Marital status: Divorced    Spouse name: Not on file  . Number of children: 3  . Years of education: Not on file  . Highest education level: Not on file  Occupational History  . Occupation: disabled    Employer: RETIRED  Tobacco Use  . Smoking status: Never Smoker  . Smokeless tobacco: Never Used  Substance and Sexual Activity  . Alcohol use: No  . Drug use: No  . Sexual activity: Not on file  Other Topics Concern  . Not on file  Social History Narrative   Has living will   Requests daughter [name] as his health care POA   Would accept brief attempt at resuscitation but no prolonged ventilation   No tube feeds if cognitively unaware   Social Determinants of Health   Financial Resource Strain:   . Difficulty of Paying Living Expenses:   Food Insecurity:   . Worried About Charity fundraiser in the Last Year:   . Arboriculturist in the Last Year:   Transportation Needs:   . Film/video editor (Medical):   Marland Kitchen Lack of Transportation (Non-Medical):   Physical Activity:   . Days of Exercise per Week:   . Minutes of Exercise per Session:   Stress:   . Feeling of Stress :   Social Connections:   . Frequency of Communication with Friends and Family:   . Frequency of Social Gatherings with Friends and Family:   . Attends Religious Services:   . Active Member of Clubs or Organizations:   . Attends Archivist Meetings:   Marland Kitchen Marital Status:   Intimate Partner Violence:   . Fear of Current or Ex-Partner:   . Emotionally Abused:   Marland Kitchen Physically Abused:   . Sexually Abused:    Review of Systems  Bowels regular---no change in control Voids okay No N/V Appetite is okay     Objective:   Physical Exam Constitutional:      Appearance: Normal appearance.  Musculoskeletal:     Comments: No spine tenderness Tenderness along lumbar  paraspinals SLR negative bilaterally Hip ROM is fairly normal  Neurological:     Mental Status: He is alert.     Comments: Antalgic gait Mild weakness left hip flexors            Assessment & Plan:

## 2020-01-17 ENCOUNTER — Other Ambulatory Visit: Payer: Self-pay

## 2020-01-17 MED ORDER — COLCHICINE 0.6 MG PO TABS: 0.6000 mg | ORAL_TABLET | Freq: Two times a day (BID) | ORAL | 0 refills | Status: DC | PRN

## 2020-02-28 ENCOUNTER — Other Ambulatory Visit: Payer: Self-pay | Admitting: Internal Medicine

## 2020-03-16 ENCOUNTER — Other Ambulatory Visit: Payer: Self-pay

## 2020-03-16 ENCOUNTER — Other Ambulatory Visit: Payer: Self-pay | Admitting: Internal Medicine

## 2020-04-10 ENCOUNTER — Other Ambulatory Visit: Payer: Self-pay

## 2020-04-10 MED ORDER — COLCHICINE 0.6 MG PO TABS: 0.6000 mg | ORAL_TABLET | Freq: Two times a day (BID) | ORAL | 0 refills | Status: DC | PRN

## 2020-05-01 ENCOUNTER — Ambulatory Visit (INDEPENDENT_AMBULATORY_CARE_PROVIDER_SITE_OTHER): Payer: Medicare Other | Admitting: Internal Medicine

## 2020-05-01 ENCOUNTER — Encounter: Payer: Self-pay | Admitting: Internal Medicine

## 2020-05-01 ENCOUNTER — Other Ambulatory Visit: Payer: Self-pay

## 2020-05-01 VITALS — BP 130/86 | HR 92 | Temp 97.1°F | Ht 70.5 in | Wt 269.0 lb

## 2020-05-01 DIAGNOSIS — Z0000 Encounter for general adult medical examination without abnormal findings: Secondary | ICD-10-CM

## 2020-05-01 DIAGNOSIS — K219 Gastro-esophageal reflux disease without esophagitis: Secondary | ICD-10-CM | POA: Diagnosis not present

## 2020-05-01 DIAGNOSIS — N390 Urinary tract infection, site not specified: Secondary | ICD-10-CM | POA: Diagnosis not present

## 2020-05-01 DIAGNOSIS — Z944 Liver transplant status: Secondary | ICD-10-CM | POA: Diagnosis not present

## 2020-05-01 DIAGNOSIS — Z1211 Encounter for screening for malignant neoplasm of colon: Secondary | ICD-10-CM

## 2020-05-01 DIAGNOSIS — Z23 Encounter for immunization: Secondary | ICD-10-CM

## 2020-05-01 DIAGNOSIS — I10 Essential (primary) hypertension: Secondary | ICD-10-CM

## 2020-05-01 DIAGNOSIS — N400 Enlarged prostate without lower urinary tract symptoms: Secondary | ICD-10-CM

## 2020-05-01 DIAGNOSIS — M1000 Idiopathic gout, unspecified site: Secondary | ICD-10-CM

## 2020-05-01 NOTE — Assessment & Plan Note (Signed)
I have personally reviewed the Medicare Annual Wellness questionnaire and have noted 1. The patient's medical and social history 2. Their use of alcohol, tobacco or illicit drugs 3. Their current medications and supplements 4. The patient's functional ability including ADL's, fall risks, home safety risks and hearing or visual             impairment. 5. Diet and physical activities 6. Evidence for depression or mood disorders  The patients weight, height, BMI and visual acuity have been recorded in the chart I have made referrals, counseling and provided education to the patient based review of the above and I have provided the pt with a written personalized care plan for preventive services.  I have provided you with a copy of your personalized plan for preventive services. Please take the time to review along with your updated medication list.  Prefers no COVID vaccine--asked him to reconsider Flu vaccine today Discussed PSA---he still wants to hold off on this Discussed exercise Overdue for colonoscopy--will set up locally for him

## 2020-05-01 NOTE — Assessment & Plan Note (Signed)
Recurrent symptoms despite the PPI Takes with food sometimes----asked him to go back to bid and take on empty stomach

## 2020-05-01 NOTE — Assessment & Plan Note (Signed)
Continues on tacrolimus, prednisone and mycophenolate

## 2020-05-01 NOTE — Assessment & Plan Note (Signed)
No recent problems  °

## 2020-05-01 NOTE — Progress Notes (Signed)
Hearing Screening   125Hz  250Hz  500Hz  1000Hz  2000Hz  3000Hz  4000Hz  6000Hz  8000Hz   Right ear:   25 20 20  25     Left ear:   20 20 20  25       Visual Acuity Screening   Right eye Left eye Both eyes  Without correction: 20/25 20/70 20/25   With correction:

## 2020-05-01 NOTE — Progress Notes (Signed)
Subjective:    Patient ID: Devin White, male    DOB: [birth year], 64 y.o.   MRN: [id number]  HPI Here for Medicare wellness visit and follow up of chronic health conditions This visit occurred during the SARS-CoV-2 public health emergency.  Safety protocols were in place, including screening questions prior to the visit, additional usage of staff PPE, and extensive cleaning of exam room while observing appropriate contact time as indicated for disinfecting solutions.   Reviewed advanced directives Reviewed other doctors---Dr Loletha Grayer ---GI, Ms Cauthen---transplant coordinator, Dr Janace Litten No hospitalization or surgery in past year No alcohol or tobacco Trying to stay active--watches 49 year old great grandson (lives with him along with his mom) Vision is not as good in left eye (especially at night) Hearing is good No falls No depression or anhedonia Independent with instrumental ADLs No sig memory issues  Still having GI problems for 1.5 years Will get nausea and belching like once a week. Next day may have diarrhea (6-10 times). No medications help (pepto, tums, etc) Continues on the omeprazole--but still gets water brash (he takes it in the morning but not the prescribed bid) No dysphagia  Liver transplant status is okay Same immunosuppressive regimen  No recent UTI No action about small stones Nocturia is once at most Good urine flow now  Has had more gout flares recently Usually better with 2-3 days of colchicine  Last GFR 40  Current Outpatient Medications on File Prior to Visit  Medication Sig Dispense Refill  . calcium carbonate (TUMS) 500 MG chewable tablet Chew 1 tablet (200 mg of elemental calcium total) by mouth 2 (two) times daily as needed for indigestion or heartburn. 180 tablet 1  . colchicine 0.6 MG tablet Take 1 tablet (0.6 mg total) by mouth 2 (two) times daily as needed. 30 tablet 0  . mycophenolate (CELLCEPT) 250 MG capsule Take 250-500 mg by  mouth 2 (two) times daily. 2 capsules (500 mg) in the morning and one capsule (250 mg) in the evening    . omeprazole (PRILOSEC) 20 MG capsule TAKE 1 CAPSULE BY MOUTH TWICE DAILY 180 capsule 3  . predniSONE (DELTASONE) 5 MG tablet Take 2 tablets (10 mg total) by mouth daily with breakfast. (Patient taking differently: Take 5 mg by mouth daily with breakfast. ) 30 tablet 0  . tacrolimus (PROGRAF) 0.5 MG capsule Take 0.5-1 mg by mouth See admin instructions. Take 2 capsules (1mg ) by mouth every morning and 1 capsule (0.5mg ) by mouth every night    . tamsulosin (FLOMAX) 0.4 MG CAPS capsule TAKE 1 CAPSULE(0.4 MG) BY MOUTH DAILY 90 capsule 0   No current facility-administered medications on file prior to visit.    No Known Allergies  Past Medical History:  Diagnosis Date  . Benign prostatic hypertrophy   . GERD (gastroesophageal reflux disease)   . Gout   . History of blood transfusion    pt has antibodies in his blood since previous transfusions  . History of cirrhosis of liver S/P TRANSPLANT 2009  . History of liver failure S/P TRANSPLANT  . Hypertension   . Left ureteral calculus     Past Surgical History:  Procedure Laterality Date  . bone morrow biopsy    . CHOLECYSTECTOMY  2007  . CYSTO/ LEFT RETROGRADE PYELOGRAM/ LEFT URETERAL STENT PLACEMENT  03-05-2012  DR Tresa Moore [facility])   LEFT URETERAL CALCULI  . CYSTOSCOPY W/ URETERAL STENT PLACEMENT  03/11/2012   Procedure: CYSTOSCOPY WITH STENT REPLACEMENT;  Surgeon: Alexis Frock,  MD;  Location: [place];  Service: Urology;  Laterality: Left;  . HERNIA REPAIR  2008  . LIVER BIOPSY    . LIVER TRANSPLANT  11/24/2007   pt states doing well since liver transplant  . LUMBAR DISC SURGERY    . LUMBAR FUSION    . removal of fistula    . URETEROSCOPY  03/11/2012   Procedure: URETEROSCOPY;  Surgeon: Alexis Frock, MD;  Location: [facility];  Service: Urology;  Laterality: Left;   STONE MANIPULATION, stone  obtained  [place] [facility]    Family History  Problem Relation Age of Onset  . Cancer Mother        colon  . Arthritis Mother   . Vasculitis Mother   . Hypertension Mother   . Cancer Father        colon  . Kidney disease Father   . Arthritis Father   . Arthritis Brother   . Alcohol abuse Maternal Aunt   . Diabetes Maternal Aunt   . Alcohol abuse Maternal Uncle   . Diabetes Paternal Aunt   . Alcohol abuse Maternal Uncle   . Alcohol abuse Maternal Uncle     Social History   Socioeconomic History  . Marital status: Divorced    Spouse name: Not on file  . Number of children: 3  . Years of education: Not on file  . Highest education level: Not on file  Occupational History  . Occupation: disabled    Employer: RETIRED  Tobacco Use  . Smoking status: Never Smoker  . Smokeless tobacco: Never Used  Substance and Sexual Activity  . Alcohol use: No  . Drug use: No  . Sexual activity: Not on file  Other Topics Concern  . Not on file  Social History Narrative   Has living will   Requests daughter [name] as his health care POA   Would accept brief attempt at resuscitation but no prolonged ventilation   No tube feeds if cognitively unaware   Social Determinants of Health   Financial Resource Strain:   . Difficulty of Paying Living Expenses: Not on file  Food Insecurity:   . Worried About Charity fundraiser in the Last Year: Not on file  . Ran Out of Food in the Last Year: Not on file  Transportation Needs:   . Lack of Transportation (Medical): Not on file  . Lack of Transportation (Non-Medical): Not on file  Physical Activity:   . Days of Exercise per Week: Not on file  . Minutes of Exercise per Session: Not on file  Stress:   . Feeling of Stress : Not on file  Social Connections:   . Frequency of Communication with Friends and Family: Not on file  . Frequency of Social Gatherings with Friends and Family: Not on file  . Attends Religious Services: Not on file   . Active Member of Clubs or Organizations: Not on file  . Attends Archivist Meetings: Not on file  . Marital Status: Not on file  Intimate Partner Violence:   . Fear of Current or Ex-Partner: Not on file  . Emotionally Abused: Not on file  . Physically Abused: Not on file  . Sexually Abused: Not on file   Review of Systems Teeth okay---needs new dentist (no one local) No skin lesions that he is concerned about Appetite is okay Weight is stable Sleep is not great---only a few hours but feels good after 4-5 hours Bowels are fine  when not having the spells No current back pain or other joint issues No chest pain or SOB No dizziness or syncope     Objective:   Physical Exam Constitutional:      Appearance: Normal appearance.  HENT:     Mouth/Throat:     Comments: No lesions Eyes:     Conjunctiva/sclera: Conjunctivae normal.     Pupils: Pupils are equal, round, and reactive to light.  Cardiovascular:     Rate and Rhythm: Normal rate and regular rhythm.     Pulses: Normal pulses.     Heart sounds: No murmur heard.  No gallop.   Pulmonary:     Effort: Pulmonary effort is normal.     Breath sounds: No wheezing.     Comments: Very faint dry early inspiratory crackles Abdominal:     Palpations: Abdomen is soft.     Tenderness: There is no abdominal tenderness.  Musculoskeletal:     Cervical back: Neck supple.     Right lower leg: No edema.     Left lower leg: No edema.  Lymphadenopathy:     Cervical: No cervical adenopathy.  Skin:    General: Skin is warm.     Findings: No rash.  Neurological:     Mental Status: He is alert and oriented to person, place, and time.     Comments: President--- "Zoila Shutter, Obama" [id number] Recall 3/3  Psychiatric:        Mood and Affect: Mood normal.        Behavior: Behavior normal.            Assessment & Plan:

## 2020-05-01 NOTE — Assessment & Plan Note (Signed)
Doing okay on the tamsulosin

## 2020-05-01 NOTE — Assessment & Plan Note (Signed)
BP Readings from Last 3 Encounters:  05/01/20 130/86  12/29/19 136/90  08/12/19 (!) 185/89   Good control wihtout meds

## 2020-05-01 NOTE — Assessment & Plan Note (Signed)
Uses the colchicine prn

## 2020-05-29 DIAGNOSIS — D849 Immunodeficiency, unspecified: Secondary | ICD-10-CM | POA: Diagnosis not present

## 2020-05-29 DIAGNOSIS — Z944 Liver transplant status: Secondary | ICD-10-CM | POA: Diagnosis not present

## 2020-05-31 ENCOUNTER — Telehealth: Payer: Self-pay | Admitting: Internal Medicine

## 2020-05-31 NOTE — Telephone Encounter (Signed)
I spoke with pt;ringing in rt ear continuously that started 05/27/20; rt ear feels sore; no drainage from ear. Pt has head congestion & when blow nose light yellow greenish mucus; No cough and no fever and no S/T, no H/A and no dizziness. No other covid symptoms. Pt is going to go to [facility] in [place] to have face to face visit so someone can look in pts ear. FYI to Dr Silvio Pate.

## 2020-05-31 NOTE — Telephone Encounter (Signed)
Patient is calling in. He was wanting to schedule an appt. He states that he has a stuffy head with ringing in his ears. Spoke with Leafy Ro and states that we may need to get more clinical questions from patient prior to scheduling him. Please advise EM

## 2020-05-31 NOTE — Telephone Encounter (Signed)
Noted Please check on him tomorrow

## 2020-06-01 ENCOUNTER — Encounter: Payer: Self-pay | Admitting: Internal Medicine

## 2020-06-01 ENCOUNTER — Ambulatory Visit
Admission: RE | Admit: 2020-06-01 | Discharge: 2020-06-01 | Disposition: A | Discharge status: Home / Self Care | Payer: Medicare Other | Source: Ambulatory Visit

## 2020-06-01 VITALS — BP 148/101 | HR 83 | Temp 97.6°F | Resp 16 | Wt 269.0 lb

## 2020-06-01 DIAGNOSIS — T161XXA Foreign body in right ear, initial encounter: Secondary | ICD-10-CM | POA: Diagnosis not present

## 2020-06-01 NOTE — Discharge Instructions (Addendum)
We removed the cotton from the ear.  Follow up as needed for continued or worsening symptoms

## 2020-06-01 NOTE — ED Triage Notes (Signed)
Patient presents to Urgent Care with complaints of right ear pain x 2 days. Pt reports cleaning right ear Saturday and unsure if related if some of the cotton got lodged in ear.   Denies fever, N/V, chills, diarrhea or ear drainage.

## 2020-06-01 NOTE — ED Provider Notes (Signed)
Devin White    CSN: [id number] Arrival date & time: 06/01/20  1339      History   Chief Complaint Chief Complaint  Patient presents with  . Otalgia    HPI Devin White is a 64 y.o. male.   Pt is a 64 year old male that presents with possible foreign body in the right ear. Trouble hearing. No pain or drainage. Some ringing in the ear. No fever.      Past Medical History:  Diagnosis Date  . Benign prostatic hypertrophy   . GERD (gastroesophageal reflux disease)   . Gout   . History of blood transfusion    pt has antibodies in his blood since previous transfusions  . History of cirrhosis of liver S/P TRANSPLANT 2009  . History of liver failure S/P TRANSPLANT  . Hypertension   . Left ureteral calculus     Patient Active Problem List   Diagnosis Date Noted  . Recurrent UTI 03/26/2019  . Cystitis 12/01/2018  . Pulmonary nodule 08/04/2018  . Liver transplant status ([place]) 10/10/2017  . Advance directive discussed with patient 10/10/2017  . Chronic kidney disease, stage III (moderate) ([place]) 10/09/2016  . Mallory-Weiss tear 04/16/2016  . Long-term use of immunosuppressant medication 04/08/2016  . Sleep apnea 10/06/2015  . 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 Hypertension   . Routine general medical examination at a health care facility 04/26/2011  . Chronic liver failure ([place]) 09/24/2010  . GOUT 12/21/2008  . BPH without urinary obstruction 12/21/2008  . ALLERGIC RHINITIS 05/22/2007  . GERD 05/22/2007  . HIATAL HERNIA 05/22/2007  . IRRITABLE BOWEL SYNDROME, HX OF 05/22/2007  . RENAL CALCULUS, HX OF 05/22/2007    Past Surgical History:  Procedure Laterality Date  . bone morrow biopsy    . CHOLECYSTECTOMY  2007  . CYSTO/ LEFT RETROGRADE PYELOGRAM/ LEFT URETERAL STENT PLACEMENT  03-05-2012  DR Tresa Moore [facility])   LEFT URETERAL CALCULI  . CYSTOSCOPY W/ URETERAL STENT PLACEMENT  03/11/2012   Procedure: CYSTOSCOPY  WITH STENT REPLACEMENT;  Surgeon: Alexis Frock, MD;  Location: [facility];  Service: Urology;  Laterality: Left;  . HERNIA REPAIR  2008  . LIVER BIOPSY    . LIVER TRANSPLANT  11/24/2007   pt states doing well since liver transplant  . LUMBAR DISC SURGERY    . LUMBAR FUSION    . removal of fistula    . URETEROSCOPY  03/11/2012   Procedure: URETEROSCOPY;  Surgeon: Alexis Frock, MD;  Location: [facility];  Service: Urology;  Laterality: Left;   STONE MANIPULATION, stone obtained  Loop Medications    Prior to Admission medications   Medication Sig Start Date End Date Taking? Authorizing Provider  calcium carbonate (TUMS) 500 MG chewable tablet Chew 1 tablet (200 mg of elemental calcium total) by mouth 2 (two) times daily as needed for indigestion or heartburn. 06/26/16   Tonia Ghent, MD  colchicine 0.6 MG tablet Take 1 tablet (0.6 mg total) by mouth 2 (two) times daily as needed. 04/10/20   Venia Carbon, MD  mycophenolate (CELLCEPT) 250 MG capsule Take 250-500 mg by mouth 2 (two) times daily. 2 capsules (500 mg) in the morning and one capsule (250 mg) in the evening 02/04/19   [provider]  omeprazole (PRILOSEC) 20 MG capsule TAKE 1 CAPSULE BY MOUTH TWICE DAILY 02/28/20   Venia Carbon, MD  predniSONE (DELTASONE) 5 MG tablet Take 2 tablets (10 mg total) by mouth daily with breakfast. Patient taking differently: Take 5 mg by mouth daily with breakfast.  08/09/18   Kayleen Memos, DO  tacrolimus (PROGRAF) 0.5 MG capsule Take 0.5-1 mg by mouth See admin instructions. Take 2 capsules (1mg ) by mouth every morning and 1 capsule (0.5mg ) by mouth every night    [provider]  tamsulosin (FLOMAX) 0.4 MG CAPS capsule TAKE 1 CAPSULE(0.4 MG) BY MOUTH DAILY 03/16/20   Venia Carbon, MD    Family History Family History  Problem Relation Age of Onset  . Cancer Mother        colon  . Arthritis Mother   .  Vasculitis Mother   . Hypertension Mother   . Cancer Father        colon  . Kidney disease Father   . Arthritis Father   . Arthritis Brother   . Alcohol abuse Maternal Aunt   . Diabetes Maternal Aunt   . Alcohol abuse Maternal Uncle   . Diabetes Paternal Aunt   . Alcohol abuse Maternal Uncle   . Alcohol abuse Maternal Uncle     Social History Social History   Tobacco Use  . Smoking status: Never Smoker  . Smokeless tobacco: Never Used  Substance Use Topics  . Alcohol use: No  . Drug use: No     Allergies   Patient has no known allergies.   Review of Systems Review of Systems   Physical Exam Triage Vital Signs ED Triage Vitals  Enc Vitals Group     BP 06/01/20 1352 (S) (!) 148/101     Pulse Rate 06/01/20 1352 83     Resp 06/01/20 1352 16     Temp 06/01/20 1352 97.6 F (36.4 C)     Temp Source 06/01/20 1352 Temporal     SpO2 06/01/20 1352 97 %     Weight 06/01/20 1351 269 lb (122 kg)     Height --      Head Circumference --      Peak Flow --      Pain Score 06/01/20 1351 3     Pain Loc --      Pain Edu? --      Excl. in [place]? --    No data found.  Updated Vital Signs BP (S) (!) 148/101 (BP Location: Left Arm) Comment: BP has been elevated since Monday  Pulse 83   Temp 97.6 F (36.4 C) (Temporal)   Resp 16   Wt 269 lb (122 kg)   SpO2 97%   BMI 38.05 kg/m   Visual Acuity Right Eye Distance:   Left Eye Distance:   Bilateral Distance:    Right Eye Near:   Left Eye Near:    Bilateral Near:     Physical Exam Vitals and nursing note reviewed.  Constitutional:      Appearance: Normal appearance.  HENT:     Head: Normocephalic and atraumatic.     Ears:     Comments: Cotton tip in right ear.     Nose: Nose normal.  Eyes:     Conjunctiva/sclera: Conjunctivae normal.  Pulmonary:     Effort: Pulmonary effort is normal.  Musculoskeletal:        General: Normal range of motion.     Cervical back: Normal range of motion.  Skin:    General:  Skin is warm and dry.  Neurological:     Mental Status:  He is alert.  Psychiatric:        Mood and Affect: Mood normal.      UC Treatments / Results  Labs (all labs ordered are listed, but only abnormal results are displayed) Labs Reviewed - No data to display  EKG   Radiology No results found.  Procedures Procedures (including critical care time)  Medications Ordered in UC Medications - No data to display  Initial Impression / Assessment and Plan / UC Course  I have reviewed the triage vital signs and the nursing notes.  Pertinent labs & imaging results that were available during my care of the patient were reviewed by me and considered in my medical decision making (see chart for details).     Foreign body in the right ear Piece of cotton removed in the right ear Patient hearing much improved.  Tolerated well. Follow up as needed for continued or worsening symptoms  Final Clinical Impressions(s) / UC Diagnoses   Final diagnoses:  Foreign body of right ear, initial encounter     Discharge Instructions     We removed the cotton from the ear.  Follow up as needed for continued or worsening symptoms     ED Prescriptions    None     PDMP not reviewed this encounter.   Orvan July, NP 06/01/20 1453

## 2020-06-02 NOTE — Telephone Encounter (Signed)
Spoke to pt. He went to [facility] and said there was something in his ear. They took it out and he is doing much better.

## 2020-06-05 DIAGNOSIS — H269 Unspecified cataract: Secondary | ICD-10-CM | POA: Diagnosis not present

## 2020-06-05 DIAGNOSIS — H43811 Vitreous degeneration, right eye: Secondary | ICD-10-CM | POA: Diagnosis not present

## 2020-06-05 DIAGNOSIS — H25011 Cortical age-related cataract, right eye: Secondary | ICD-10-CM | POA: Diagnosis not present

## 2020-06-23 ENCOUNTER — Other Ambulatory Visit: Payer: Self-pay

## 2020-06-23 MED ORDER — TAMSULOSIN HCL 0.4 MG PO CAPS: 0.4000 mg | ORAL_CAPSULE | Freq: Every day | ORAL | 1 refills | Status: DC

## 2020-06-27 ENCOUNTER — Ambulatory Visit: Payer: Medicare Other | Admitting: Gastroenterology

## 2020-06-28 ENCOUNTER — Other Ambulatory Visit: Payer: Self-pay

## 2020-06-28 ENCOUNTER — Encounter: Payer: Self-pay | Admitting: Gastroenterology

## 2020-06-28 ENCOUNTER — Ambulatory Visit: Payer: Medicare Other | Admitting: Gastroenterology

## 2020-06-28 VITALS — BP 153/96 | HR 93 | Temp 97.8°F | Ht 70.5 in | Wt 273.2 lb

## 2020-06-28 DIAGNOSIS — K219 Gastro-esophageal reflux disease without esophagitis: Secondary | ICD-10-CM

## 2020-06-28 DIAGNOSIS — R1013 Epigastric pain: Secondary | ICD-10-CM | POA: Diagnosis not present

## 2020-06-28 DIAGNOSIS — Z86010 Personal history of colonic polyps: Secondary | ICD-10-CM

## 2020-06-28 NOTE — Progress Notes (Signed)
Gastroenterology Consultation  Referring Provider:     Venia Carbon, MD Primary Care Physician:  Venia Carbon, MD Primary Gastroenterologist:  Dr. Allen Norris     Reason for Consultation:     GERD and need for colonoscopy        HPI:   Devin White is a 65 y.o. y/o male referred for consultation & management of GERD and need for colonoscopy by Dr. Silvio Pate, Theophilus Kinds, MD.  This patient comes to see me after being followed for past at [facility].  The patient had a colonoscopy in 2017 that showed 2 polyps that were tubular adenomas.  The patient had a liver transplant back in 7371 for nonalcoholic fatty liver disease.  There has been some concern of development of autoimmune hepatitis versus transplant rejection that is being followed at [facility].  The patient reports that he has episodes of regurgitation with a bad taste in his mouth.  The patient was put on omeprazole twice a day by his primary care provider.  He states that he has been taking this for so long he is not sure it works.  He reports a few times a month he will have this regurgitation of bitter material into his mouth and that will result in a lot of burping and then the following day he will have diarrhea.  In between these attacks he is not having any symptoms whatsoever.  The patient is also concerned about his 5 mg of prednisone and states that he would love to have come off of it but the liver transplant team has kept him on it.  He is concerned because he has been on it for 7 years.  There is no report of any unexplained weight loss fevers chills nausea vomiting black stools or bloody stools.  Past Medical History:  Diagnosis Date  . Benign prostatic hypertrophy   . GERD (gastroesophageal reflux disease)   . Gout   . History of blood transfusion    pt has antibodies in his blood since previous transfusions  . History of cirrhosis of liver S/P TRANSPLANT 2009  . History of liver failure S/P TRANSPLANT  .  Hypertension   . Left ureteral calculus     Past Surgical History:  Procedure Laterality Date  . bone morrow biopsy    . CHOLECYSTECTOMY  2007  . CYSTO/ LEFT RETROGRADE PYELOGRAM/ LEFT URETERAL STENT PLACEMENT  03-05-2012  DR Tresa Moore [facility])   LEFT URETERAL CALCULI  . CYSTOSCOPY W/ URETERAL STENT PLACEMENT  03/11/2012   Procedure: CYSTOSCOPY WITH STENT REPLACEMENT;  Surgeon: Alexis Frock, MD;  Location: [facility];  Service: Urology;  Laterality: Left;  . HERNIA REPAIR  2008  . LIVER BIOPSY    . LIVER TRANSPLANT  11/24/2007   pt states doing well since liver transplant  . LUMBAR DISC SURGERY    . LUMBAR FUSION    . removal of fistula    . URETEROSCOPY  03/11/2012   Procedure: URETEROSCOPY;  Surgeon: Alexis Frock, MD;  Location: [facility];  Service: Urology;  Laterality: Left;   STONE MANIPULATION, stone obtained  [place] [facility]    Prior to Admission medications   Medication Sig Start Date End Date Taking? Authorizing Provider  calcium carbonate (TUMS) 500 MG chewable tablet Chew 1 tablet (200 mg of elemental calcium total) by mouth 2 (two) times daily as needed for indigestion or heartburn. 06/26/16   Tonia Ghent, MD  colchicine 0.6 MG  tablet Take 1 tablet (0.6 mg total) by mouth 2 (two) times daily as needed. 04/10/20   Venia Carbon, MD  mycophenolate (CELLCEPT) 250 MG capsule Take 250-500 mg by mouth 2 (two) times daily. 2 capsules (500 mg) in the morning and one capsule (250 mg) in the evening 02/04/19   [provider]  omeprazole (PRILOSEC) 20 MG capsule TAKE 1 CAPSULE BY MOUTH TWICE DAILY 02/28/20   Venia Carbon, MD  predniSONE (DELTASONE) 5 MG tablet Take 2 tablets (10 mg total) by mouth daily with breakfast. Patient taking differently: Take 5 mg by mouth daily with breakfast.  08/09/18   Kayleen Memos, DO  tacrolimus (PROGRAF) 0.5 MG capsule Take 0.5-1 mg by mouth See admin instructions. Take 2 capsules (1mg ) by mouth  every morning and 1 capsule (0.5mg ) by mouth every night    [provider]  tamsulosin (FLOMAX) 0.4 MG CAPS capsule Take 1 capsule (0.4 mg total) by mouth daily. 06/23/20   Venia Carbon, MD    Family History  Problem Relation Age of Onset  . Cancer Mother        colon  . Arthritis Mother   . Vasculitis Mother   . Hypertension Mother   . Cancer Father        colon  . Kidney disease Father   . Arthritis Father   . Arthritis Brother   . Alcohol abuse Maternal Aunt   . Diabetes Maternal Aunt   . Alcohol abuse Maternal Uncle   . Diabetes Paternal Aunt   . Alcohol abuse Maternal Uncle   . Alcohol abuse Maternal Uncle      Social History   Tobacco Use  . Smoking status: Never Smoker  . Smokeless tobacco: Never Used  Substance Use Topics  . Alcohol use: No  . Drug use: No    Allergies as of 06/28/2020  . (No Known Allergies)    Review of Systems:    All systems reviewed and negative except where noted in HPI.   Physical Exam:  There were no vitals taken for this visit. No LMP for male patient. General:   Alert,  Well-developed, well-nourished, pleasant and cooperative in NAD Head:  Normocephalic and atraumatic. Eyes:  Sclera clear, no icterus.   Conjunctiva pink. Ears:  Normal auditory acuity. Neck:  Supple; no masses or thyromegaly. Lungs:  Respirations even and unlabored.  Clear throughout to auscultation.   No wheezes, crackles, or rhonchi. No acute distress. Heart:  Regular rate and rhythm; no murmurs, clicks, rubs, or gallops. Abdomen:  Normal bowel sounds.  No bruits.  Soft, non-tender and non-distended without masses, hepatosplenomegaly or hernias noted.  No guarding or rebound tenderness.  Negative Carnett sign.   Rectal:  Deferred.  Pulses:  Normal pulses noted. Extremities:  No clubbing or edema.  No cyanosis. Neurologic:  Alert and oriented x3;  grossly normal neurologically. Skin:  Intact without significant lesions or rashes.  No  jaundice. Lymph Nodes:  No significant cervical adenopathy. Psych:  Alert and cooperative. Normal mood and affect.  Imaging Studies: No results found.  Assessment and Plan:   Devin White is a 65 y.o. y/o male who comes in today with a history of a liver transplant in 2009 for fatty liver.  The patient also had a colonoscopy that showed him to have adenomatous polyps and he is due for repeat colonoscopy.  The patient has intermittent attacks of reflux that resulted in dyspepsia, burping and diarrhea the next day.  The patient will be set up for an EGD and colonoscopy due to his reflux symptoms and his history of polyps.  The patient has been explained the plan and agrees with it.    Lucilla Lame, MD. Marval Regal    Note: This dictation was prepared with Dragon dictation along with smaller phrase technology. Any transcriptional errors that result from this process are unintentional.

## 2020-06-29 ENCOUNTER — Other Ambulatory Visit: Payer: Self-pay

## 2020-06-30 MED ORDER — COLCHICINE 0.6 MG PO TABS: 0.6000 mg | ORAL_TABLET | Freq: Two times a day (BID) | ORAL | 2 refills | Status: DC | PRN

## 2020-07-05 ENCOUNTER — Encounter: Payer: Self-pay | Admitting: Anesthesiology

## 2020-07-05 ENCOUNTER — Encounter: Payer: Self-pay | Admitting: Gastroenterology

## 2020-07-12 ENCOUNTER — Other Ambulatory Visit: Admission: RE | Admit: 2020-07-12 | Payer: Medicare Other | Source: Ambulatory Visit

## 2020-07-13 NOTE — Discharge Instructions (Signed)

## 2020-07-14 ENCOUNTER — Ambulatory Visit: Admission: RE | Admit: 2020-07-14 | Payer: Medicare Other | Source: Home / Self Care | Admitting: Gastroenterology

## 2020-07-14 HISTORY — DX: Unspecified osteoarthritis, unspecified site: M19.90

## 2020-07-14 SURGERY — COLONOSCOPY WITH PROPOFOL
Anesthesia: Choice

## 2020-09-21 ENCOUNTER — Other Ambulatory Visit: Payer: Self-pay

## 2020-09-21 ENCOUNTER — Inpatient Hospital Stay
Admission: EM | Admit: 2020-09-21 | Discharge: 2020-09-24 | DRG: 392 | Disposition: A | Discharge status: Home / Self Care | Payer: Medicare Other | Attending: Internal Medicine | Admitting: Internal Medicine

## 2020-09-21 ENCOUNTER — Emergency Department: Payer: Medicare Other

## 2020-09-21 ENCOUNTER — Telehealth: Payer: Self-pay

## 2020-09-21 DIAGNOSIS — Z833 Family history of diabetes mellitus: Secondary | ICD-10-CM | POA: Diagnosis not present

## 2020-09-21 DIAGNOSIS — I313 Pericardial effusion (noninflammatory): Secondary | ICD-10-CM | POA: Diagnosis not present

## 2020-09-21 DIAGNOSIS — Z8261 Family history of arthritis: Secondary | ICD-10-CM | POA: Diagnosis not present

## 2020-09-21 DIAGNOSIS — Z9049 Acquired absence of other specified parts of digestive tract: Secondary | ICD-10-CM | POA: Diagnosis not present

## 2020-09-21 DIAGNOSIS — Z87442 Personal history of urinary calculi: Secondary | ICD-10-CM | POA: Diagnosis not present

## 2020-09-21 DIAGNOSIS — N281 Cyst of kidney, acquired: Secondary | ICD-10-CM | POA: Diagnosis not present

## 2020-09-21 DIAGNOSIS — R509 Fever, unspecified: Secondary | ICD-10-CM | POA: Diagnosis not present

## 2020-09-21 DIAGNOSIS — R0902 Hypoxemia: Secondary | ICD-10-CM

## 2020-09-21 DIAGNOSIS — R300 Dysuria: Secondary | ICD-10-CM | POA: Diagnosis present

## 2020-09-21 DIAGNOSIS — N400 Enlarged prostate without lower urinary tract symptoms: Secondary | ICD-10-CM | POA: Diagnosis present

## 2020-09-21 DIAGNOSIS — R112 Nausea with vomiting, unspecified: Principal | ICD-10-CM | POA: Diagnosis present

## 2020-09-21 DIAGNOSIS — Z800 Family history of malignant neoplasm of digestive organs: Secondary | ICD-10-CM | POA: Diagnosis not present

## 2020-09-21 DIAGNOSIS — Z20822 Contact with and (suspected) exposure to covid-19: Secondary | ICD-10-CM | POA: Diagnosis not present

## 2020-09-21 DIAGNOSIS — R1110 Vomiting, unspecified: Secondary | ICD-10-CM

## 2020-09-21 DIAGNOSIS — Z7960 Long term (current) use of unspecified immunomodulators and immunosuppressants: Secondary | ICD-10-CM

## 2020-09-21 DIAGNOSIS — Z711 Person with feared health complaint in whom no diagnosis is made: Secondary | ICD-10-CM

## 2020-09-21 DIAGNOSIS — I248 Other forms of acute ischemic heart disease: Secondary | ICD-10-CM | POA: Diagnosis present

## 2020-09-21 DIAGNOSIS — K219 Gastro-esophageal reflux disease without esophagitis: Secondary | ICD-10-CM | POA: Diagnosis present

## 2020-09-21 DIAGNOSIS — N261 Atrophy of kidney (terminal): Secondary | ICD-10-CM | POA: Diagnosis not present

## 2020-09-21 DIAGNOSIS — I6782 Cerebral ischemia: Secondary | ICD-10-CM | POA: Diagnosis not present

## 2020-09-21 DIAGNOSIS — R197 Diarrhea, unspecified: Secondary | ICD-10-CM | POA: Diagnosis not present

## 2020-09-21 DIAGNOSIS — Z7952 Long term (current) use of systemic steroids: Secondary | ICD-10-CM

## 2020-09-21 DIAGNOSIS — Z841 Family history of disorders of kidney and ureter: Secondary | ICD-10-CM

## 2020-09-21 DIAGNOSIS — N1832 Chronic kidney disease, stage 3b: Secondary | ICD-10-CM | POA: Diagnosis not present

## 2020-09-21 DIAGNOSIS — G319 Degenerative disease of nervous system, unspecified: Secondary | ICD-10-CM | POA: Diagnosis not present

## 2020-09-21 DIAGNOSIS — I160 Hypertensive urgency: Secondary | ICD-10-CM | POA: Diagnosis present

## 2020-09-21 DIAGNOSIS — D84821 Immunodeficiency due to drugs: Secondary | ICD-10-CM | POA: Diagnosis not present

## 2020-09-21 DIAGNOSIS — Z79899 Other long term (current) drug therapy: Secondary | ICD-10-CM

## 2020-09-21 DIAGNOSIS — M1A9XX1 Chronic gout, unspecified, with tophus (tophi): Secondary | ICD-10-CM | POA: Diagnosis present

## 2020-09-21 DIAGNOSIS — M2550 Pain in unspecified joint: Secondary | ICD-10-CM | POA: Diagnosis present

## 2020-09-21 DIAGNOSIS — Z944 Liver transplant status: Secondary | ICD-10-CM

## 2020-09-21 DIAGNOSIS — E669 Obesity, unspecified: Secondary | ICD-10-CM | POA: Diagnosis present

## 2020-09-21 DIAGNOSIS — R52 Pain, unspecified: Secondary | ICD-10-CM

## 2020-09-21 DIAGNOSIS — M19071 Primary osteoarthritis, right ankle and foot: Secondary | ICD-10-CM | POA: Diagnosis present

## 2020-09-21 DIAGNOSIS — Z6836 Body mass index (BMI) 36.0-36.9, adult: Secondary | ICD-10-CM

## 2020-09-21 DIAGNOSIS — I129 Hypertensive chronic kidney disease with stage 1 through stage 4 chronic kidney disease, or unspecified chronic kidney disease: Secondary | ICD-10-CM | POA: Diagnosis not present

## 2020-09-21 DIAGNOSIS — I700 Atherosclerosis of aorta: Secondary | ICD-10-CM | POA: Diagnosis not present

## 2020-09-21 DIAGNOSIS — M109 Gout, unspecified: Secondary | ICD-10-CM | POA: Diagnosis present

## 2020-09-21 DIAGNOSIS — R531 Weakness: Secondary | ICD-10-CM | POA: Diagnosis not present

## 2020-09-21 LAB — COMPREHENSIVE METABOLIC PANEL
ALT: 44 U/L (ref 0–44)
AST: 27 U/L (ref 15–41)
Albumin: 3.8 g/dL (ref 3.5–5.0)
Alkaline Phosphatase: 47 U/L (ref 38–126)
Anion gap: 9 (ref 5–15)
BUN: 19 mg/dL (ref 8–23)
CO2: 26 mmol/L (ref 22–32)
Calcium: 8.9 mg/dL (ref 8.9–10.3)
Chloride: 106 mmol/L (ref 98–111)
Creatinine, Ser: 1.72 mg/dL — ABNORMAL HIGH (ref 0.61–1.24)
GFR, Estimated: 44 mL/min — ABNORMAL LOW (ref >60)
Glucose, Bld: 129 mg/dL — ABNORMAL HIGH (ref 70–99)
Potassium: 3.6 mmol/L (ref 3.5–5.1)
Sodium: 141 mmol/L (ref 135–145)
Total Bilirubin: 1.4 mg/dL — ABNORMAL HIGH (ref 0.3–1.2)
Total Protein: 6.3 g/dL — ABNORMAL LOW (ref 6.5–8.1)

## 2020-09-21 LAB — CBC WITH DIFFERENTIAL/PLATELET
Abs Immature Granulocytes: 0.09 10*3/uL — ABNORMAL HIGH (ref 0.00–0.07)
Basophils Absolute: 0 10*3/uL (ref 0.0–0.1)
Basophils Relative: 0 %
Eosinophils Absolute: 0.2 10*3/uL (ref 0.0–0.5)
Eosinophils Relative: 2 %
HCT: 45.2 % (ref 39.0–52.0)
Hemoglobin: 15.5 g/dL (ref 13.0–17.0)
Immature Granulocytes: 1 %
Lymphocytes Relative: 13 %
Lymphs Abs: 1.3 10*3/uL (ref 0.7–4.0)
MCH: 33.3 pg (ref 26.0–34.0)
MCHC: 34.3 g/dL (ref 30.0–36.0)
MCV: 97 fL (ref 80.0–100.0)
Monocytes Absolute: 1.1 10*3/uL — ABNORMAL HIGH (ref 0.1–1.0)
Monocytes Relative: 12 %
Neutro Abs: 6.9 10*3/uL (ref 1.7–7.7)
Neutrophils Relative %: 72 %
Platelets: 169 10*3/uL (ref 150–400)
RBC: 4.66 MIL/uL (ref 4.22–5.81)
RDW: 13.6 % (ref 11.5–15.5)
WBC: 9.6 10*3/uL (ref 4.0–10.5)
nRBC: 0 % (ref 0.0–0.2)

## 2020-09-21 LAB — LACTIC ACID, PLASMA: Lactic Acid, Venous: 1.4 mmol/L (ref 0.5–1.9)

## 2020-09-21 LAB — URIC ACID: Uric Acid, Serum: 10.8 mg/dL — ABNORMAL HIGH (ref 3.7–8.6)

## 2020-09-21 LAB — RESP PANEL BY RT-PCR (FLU A&B, COVID) ARPGX2
Influenza A by PCR: NEGATIVE
Influenza B by PCR: NEGATIVE
SARS Coronavirus 2 by RT PCR: NEGATIVE

## 2020-09-21 LAB — TROPONIN I (HIGH SENSITIVITY): Troponin I (High Sensitivity): 24 ng/L — ABNORMAL HIGH (ref <18)

## 2020-09-21 MED ORDER — IOHEXOL 350 MG/ML SOLN
75.0000 mL | Freq: Once | INTRAVENOUS | Status: AC | PRN
  Administered 2020-09-21: 75 mL via INTRAVENOUS

## 2020-09-21 MED ORDER — LIDOCAINE-EPINEPHRINE 2 %-1:100000 IJ SOLN
20.0000 mL | Freq: Once | INTRAMUSCULAR | Status: AC
  Administered 2020-09-22: 20 mL via INTRADERMAL
  Filled 2020-09-21: qty 1

## 2020-09-21 MED ORDER — LABETALOL HCL 5 MG/ML IV SOLN
10.0000 mg | Freq: Once | INTRAVENOUS | Status: DC
  Filled 2020-09-21: qty 4

## 2020-09-21 MED ORDER — MORPHINE SULFATE (PF) 4 MG/ML IV SOLN
6.0000 mg | Freq: Once | INTRAVENOUS | Status: AC
  Administered 2020-09-21: 6 mg via INTRAVENOUS
  Filled 2020-09-21: qty 2

## 2020-09-21 MED ORDER — SODIUM CHLORIDE 0.9 % IV BOLUS
1000.0000 mL | Freq: Once | INTRAVENOUS | Status: AC
  Administered 2020-09-21: 1000 mL via INTRAVENOUS

## 2020-09-21 MED ORDER — HYDRALAZINE HCL 20 MG/ML IJ SOLN
5.0000 mg | Freq: Once | INTRAMUSCULAR | Status: AC
  Administered 2020-09-21: 5 mg via INTRAVENOUS
  Filled 2020-09-21: qty 1

## 2020-09-21 NOTE — ED Notes (Signed)
Pt to CT at this time with transporter

## 2020-09-21 NOTE — ED Notes (Addendum)
This RN to bedside to check on pt. Pt reports his R side "feels funny" for about an hour. States he feels some pain and tingling on his R side, slight headache. States he's starting to have some blurry vision while watching TV. BP high, 192/115. States he doesn't take blood pressure medications anymore, that he was taken off of them a while back. Isaacs MD made aware and at bedside to assess pt.

## 2020-09-21 NOTE — ED Notes (Addendum)
Pt presenting to ED with reports of R ankle and R wrist pain/swelling. Hx gout. Pt also endorses vomiting and diarrhea, inability to keep foods down. Denies urinary symptoms however states he has had limited intake over the past few days. Hx liver transplant. Pt gowned, placed on monitor. Pt AAO NAD VSS

## 2020-09-21 NOTE — ED Notes (Addendum)
Holding labetalol per Ellender Hose MD for now to not drop pt's BP too fast. Per MD, give morphine then reassess need for labetalol

## 2020-09-21 NOTE — Telephone Encounter (Signed)
[place] [facility] - Client TELEPHONE ADVICE RECORD AccessNurse Patient Name: Devin White Gender: Male DOB: [birth year] Age: 65 Y 96 M 21 D Return Phone Number: [phone] (Primary) Address: City/ [place]/ [place] [address] Client [facility] - Client Client Site [place] - Day Physician Viviana Simpler- MD Contact Type Call Who Is Calling Patient / Member / Family / Caregiver Call Type Triage / Clinical Relationship To Patient Self Return Phone Number [phone] (Primary) Chief Complaint Swelling (generalized) Reason for Call Symptomatic / Request for [facility] states he has nausea, swelling in his right heel and wrist and a bump on the back of his heel. He believes it may be gout which he has hx of. Translation No Nurse Assessment Nurse: Rolin Barry, RN, Levada Dy Date/Time (Eastern Time): 09/21/2020 4:29:00 PM Confirm and document reason for call. If symptomatic, describe symptoms. ---Caller states he has nausea, swelling in his right heel and wrist and a bump on the back of his heel. He believes it may be gout which he has hx of. Right hand, right foot. Unknown temp at this time. No known injury. Does the patient have any new or worsening symptoms? ---Yes Will a triage be completed? ---Yes Related visit to physician within the last 2 weeks? ---No Does the PT have any chronic conditions? (i.e. diabetes, asthma, this includes High risk factors for pregnancy, etc.) ---Yes List chronic conditions. ---Gout Liver transplant / 14 years ago Is this a behavioral health or substance abuse call? ---No Guidelines Guideline Title Affirmed Question Affirmed Notes Nurse Date/Time (Eastern Time) Wrist Swelling [1] Looks infected (spreading redness, pus) AND [2] large red area (> 2 in. or 5 cm) Deaton, RN, Levada Dy 09/21/2020 4:31:50 PM Ankle Pain [1] Looks  infected (spreading redness, pus) AND [2] large Deaton, RN, Levada Dy 09/21/2020 4:33:51 PM PLEASE NOTE: All timestamps contained within this report are represented as [place] Standard Time. CONFIDENTIALTY NOTICE: This fax transmission is intended only for the addressee. It contains information that is legally privileged, confidential or otherwise protected from use or disclosure. If you are not the intended recipient, you are strictly prohibited from reviewing, disclosing, copying using or disseminating any of this information or taking any action in reliance on or regarding this information. If you have received this fax in error, please notify us immediately by telephone so that we can arrange for its return to [place]. Phone: [phone], Toll-Free: [phone], Fax: [phone] Page: 2 of 2 Call Id: [id number] Guidelines Guideline Title Affirmed Question Affirmed Notes Nurse Date/Time Eilene Ghazi Time) red area (> 2 in. or 5 cm) Disp. Time Eilene Ghazi Time) Disposition Final User 09/21/2020 4:33:29 PM See HCP within 4 Hours (or PCP triage) Rolin Barry, RN, Levada Dy 09/21/2020 4:36:28 PM See HCP within 4 Hours (or PCP triage) Yes Deaton, RN, Cindee Lame Disagree/Comply Comply Caller Understands Yes PreDisposition Did not know what to do Care Advice Given Per Guideline SEE HCP (OR PCP TRIAGE) WITHIN 4 HOURS: * IF OFFICE WILL BE CLOSED AND NO PCP (PRIMARY CARE PROVIDER) SECOND-LEVEL TRIAGE: You need to be seen within the next 3 or 4 hours. A nearby Urgent Care Center [facility]) is often a good source of care. Another choice is to go to the ED. Go sooner if you become worse. CALL BACK IF: * You become worse CARE ADVICE given per Wrist Swelling (Adult) guideline. SEE HCP (OR PCP TRIAGE) WITHIN 4 HOURS: * IF OFFICE WILL BE CLOSED AND NO PCP (  PRIMARY CARE PROVIDER) SECOND-LEVEL TRIAGE: You need to be seen within the next 3 or 4 hours. A nearby Urgent Care Center [facility]) is often a good source of care. Another choice  is to go to the ED. Go sooner if you become worse. * UCC: Some UCCs can manage patients who are stable and have less serious symptoms (e.g., minor illnesses and injuries). The triager must know the [facility] capabilities before sending a patient there. If unsure, call ahead. * ED: Patients who may need surgery or hospital admission need to be sent to an ED. So do most patients with serious symptoms or complex medical problems. CARE ADVICE given per Ankle Pain (Adult) guideline. * You become worse CALL BACK IF: Comments User: Saverio Danker, RN Date/Time Eilene Ghazi Time): 09/21/2020 4:38:03 PM Caller did not want to call the office, advised that he is going to call his liver specialist. Referrals GO TO FACILITY UNDECIDED

## 2020-09-21 NOTE — ED Provider Notes (Signed)
[facility] Emergency Department Provider Note  Time seen: 7:44 PM  I have reviewed the triage vital signs and the nursing notes.   HISTORY  Chief Complaint Emesis and Joint Swelling   HPI Devin White is a 65 y.o. male with a past medical history of arthritis, gastric reflux, hypertension, gout, liver transplant on immunosuppressants who presents to the emergency department with multiple complaints.  According to the patient for the past month or 2 he has noticed a swollen area to the back of his right foot that has gotten larger.  He also states over the past month he has been experiencing intermittent pain in his right wrist.  States a history of gout and states it feels similar to gout.  He states over the past 2 to 3 days he has been feeling very weak and fatigued today he was nauseated and vomited as well as had several episodes of diarrhea so he came into the emergency department.  Denies any known fever, 99.1 in the emergency department.  No abdominal pain, no chest pain.  Past Medical History:  Diagnosis Date  . Arthritis    back and legs  . Benign prostatic hypertrophy   . GERD (gastroesophageal reflux disease)   . Gout   . History of blood transfusion    pt has antibodies in his blood since previous transfusions  . History of cirrhosis of liver S/P TRANSPLANT 2009  . History of liver failure S/P TRANSPLANT  . Hypertension   . Left ureteral calculus     Patient Active Problem List   Diagnosis Date Noted  . Recurrent UTI 03/26/2019  . Cystitis 12/01/2018  . Pulmonary nodule 08/04/2018  . Liver transplant status ([place]) 10/10/2017  . Advance directive discussed with patient 10/10/2017  . Chronic kidney disease, stage III (moderate) ([place]) 10/09/2016  . Mallory-Weiss tear 04/16/2016  . Long-term use of immunosuppressant medication 04/08/2016  . Sleep apnea 10/06/2015  . 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 Immunosuppression ([place]) 06/01/2012  . Hypertension   . Routine general medical examination at a health care facility 04/26/2011  . Chronic liver failure ([place]) 09/24/2010  . GOUT 12/21/2008  . BPH without urinary obstruction 12/21/2008  . ALLERGIC RHINITIS 05/22/2007  . GERD 05/22/2007  . HIATAL HERNIA 05/22/2007  . IRRITABLE BOWEL SYNDROME, HX OF 05/22/2007  . RENAL CALCULUS, HX OF 05/22/2007    Past Surgical History:  Procedure Laterality Date  . bone morrow biopsy    . CHOLECYSTECTOMY  2007  . CYSTO/ LEFT RETROGRADE PYELOGRAM/ LEFT URETERAL STENT PLACEMENT  03-05-2012  DR Tresa Moore [facility])   LEFT URETERAL CALCULI  . CYSTOSCOPY W/ URETERAL STENT PLACEMENT  03/11/2012   Procedure: CYSTOSCOPY WITH STENT REPLACEMENT;  Surgeon: Alexis Frock, MD;  Location: [facility];  Service: Urology;  Laterality: Left;  . HERNIA REPAIR  2008  . LIVER BIOPSY    . LIVER TRANSPLANT  11/24/2007   pt states doing well since liver transplant  . LUMBAR DISC SURGERY    . LUMBAR FUSION    . removal of fistula    . URETEROSCOPY  03/11/2012   Procedure: URETEROSCOPY;  Surgeon: Alexis Frock, MD;  Location[facility];  Service: Urology;  Laterality: Left;   STONE MANIPULATION, stone obtained  [place] MCR    Prior to Admission medications   Medication Sig Start Date End Date Taking? Authorizing Provider  calcium carbonate (TUMS) 500 MG chewable tablet Chew 1  tablet (200 mg of elemental calcium total) by mouth 2 (two) times daily as needed for indigestion or heartburn. 06/26/16   Tonia Ghent, MD  colchicine 0.6 MG tablet Take 1 tablet (0.6 mg total) by mouth 2 (two) times daily as needed. 06/30/20   Venia Carbon, MD  mycophenolate (CELLCEPT) 250 MG capsule Take 250-500 mg by mouth 2 (two) times daily. 2 capsules (500 mg) in the morning and one capsule (250 mg) in the evening 02/04/19   [provider]  omeprazole (PRILOSEC) 20 MG capsule TAKE 1 CAPSULE BY  MOUTH TWICE DAILY 02/28/20   Venia Carbon, MD  predniSONE (DELTASONE) 5 MG tablet Take 2 tablets (10 mg total) by mouth daily with breakfast. Patient taking differently: Take 5 mg by mouth daily with breakfast. 08/09/18   Kayleen Memos, DO  tacrolimus (PROGRAF) 0.5 MG capsule Take 0.5-1 mg by mouth See admin instructions. Take 2 capsules (1mg ) by mouth every morning and 1 capsule (0.5mg ) by mouth every night    [provider]  tamsulosin (FLOMAX) 0.4 MG CAPS capsule Take 1 capsule (0.4 mg total) by mouth daily. 06/23/20   Venia Carbon, MD    No Known Allergies  Family History  Problem Relation Age of Onset  . Cancer Mother        colon  . Arthritis Mother   . Vasculitis Mother   . Hypertension Mother   . Cancer Father        colon  . Kidney disease Father   . Arthritis Father   . Arthritis Brother   . Alcohol abuse Maternal Aunt   . Diabetes Maternal Aunt   . Alcohol abuse Maternal Uncle   . Diabetes Paternal Aunt   . Alcohol abuse Maternal Uncle   . Alcohol abuse Maternal Uncle     Social History Social History   Tobacco Use  . Smoking status: Never Smoker  . Smokeless tobacco: Never Used  Substance Use Topics  . Alcohol use: No  . Drug use: No    Review of Systems Constitutional: Negative for fever. Cardiovascular: Negative for chest pain. Respiratory: Negative for shortness of breath.  Negative for cough. Gastrointestinal: Negative for abdominal pain.  1 episode of vomiting today.  Positive for diarrhea. Genitourinary: Negative for urinary compaints Musculoskeletal: Intermittent pain in his right wrist x1 month.  Swelling to the back of the right foot intermittent x2 months. Neurological: Negative for headache All other ROS negative  ____________________________________________   PHYSICAL EXAM:  VITAL SIGNS: ED Triage Vitals  Enc Vitals Group     BP 09/21/20 1853 (!) 178/116     Pulse Rate 09/21/20 1853 84     Resp 09/21/20 1853 18      Temp 09/21/20 1853 99.1 F (37.3 C)     Temp Source 09/21/20 1853 Oral     SpO2 09/21/20 1853 96 %     Weight 09/21/20 1851 265 lb (120.2 kg)     Height 09/21/20 1851 6\' 1"  (1.854 m)     Head Circumference --      Peak Flow --      Pain Score 09/21/20 1851 10     Pain Loc --      Pain Edu? --      Excl. in [place]? --    Constitutional: Alert and oriented. Well appearing and in no distress. Eyes: Normal exam ENT      Head: Normocephalic and atraumatic.      Mouth/Throat: Mucous  membranes are moist. Cardiovascular: Normal rate, regular rhythm.  Respiratory: Normal respiratory effort without tachypnea nor retractions. Breath sounds are clear Gastrointestinal: Soft and nontender. No distention.   Musculoskeletal: Patient has a small area of fluctuance to the back of the right heel consistent with a bursa.  Nontender does not appear inflamed.  Patient's right wrist appears normal on exam, slight tenderness to palpation but good range of motion.  States this has been intermittently bothering him x1 month. Neurologic:  Normal speech and language. No gross focal neurologic deficits Skin:  Skin is warm, dry and intact.  Psychiatric: Mood and affect are normal. Speech and behavior are normal.   ____________________________________________    INITIAL IMPRESSION / ASSESSMENT AND PLAN / ED COURSE  Pertinent labs & imaging results that were available during my care of the patient were reviewed by me and considered in my medical decision making (see chart for details).   Patient presents to the emergency department for multiple complaints including pain to his right wrist intermittent x1 month swollen area to the back of the right foot intermittent x2 months and now several days with generalized fatigue weakness episode of vomiting today and episode of diarrhea.  The area on the patient's right foot appears consistent with a swollen bursa it is nontender to palpation does not appear erythematous.  We  will have the patient follow-up with his doctor regarding this.  Patient does have mild right wrist tenderness but good range of motion on exam states this has been bothering him for approximately 1 month.  Could be consistent with gout we will check a uric acid level although the patient is already on chronic prednisone for his liver transplant.  More concerningly would be the patient's 3 days of generalized fatigue and then nausea with episode of vomiting today and diarrhea.  We will check labs including a set of blood cultures lactic acid, as well as a Covid swab and chest x-ray in addition to a urinalysis.  Differential could include infectious etiology metabolic or electrolyte abnormality.  Remainder the work-up is pending.  Patient care signed out to oncoming provider.  CRIT OBREMSKI was evaluated in Emergency Department on 09/21/2020 for the symptoms described in the history of present illness. He was evaluated in the context of the global COVID-19 pandemic, which necessitated consideration that the patient might be at risk for infection with the SARS-CoV-2 virus that causes COVID-19. Institutional protocols and algorithms that pertain to the evaluation of patients at risk for COVID-19 are in a state of rapid change based on information released by regulatory bodies including the CDC and federal and state organizations. These policies and algorithms were followed during the patient's care in the ED.  ____________________________________________   FINAL CLINICAL IMPRESSION(S) / ED DIAGNOSES  Weakness Nausea vomiting and diarrhea Joint pain   Harvest Dark, MD 09/22/20 2026

## 2020-09-21 NOTE — ED Notes (Signed)
Paduchowski MD made aware of pt's BP

## 2020-09-21 NOTE — ED Triage Notes (Signed)
Pt states he has a hx of gout, but has a place on the back of his R ankle that is swollen- pt also states he is having pain in his right wrist- this has been going on for 3 weeks- pt states the emesis started yesterday- pt is a liver transplant pt

## 2020-09-22 DIAGNOSIS — Z79899 Other long term (current) drug therapy: Secondary | ICD-10-CM | POA: Diagnosis not present

## 2020-09-22 DIAGNOSIS — I248 Other forms of acute ischemic heart disease: Secondary | ICD-10-CM | POA: Diagnosis present

## 2020-09-22 DIAGNOSIS — K219 Gastro-esophageal reflux disease without esophagitis: Secondary | ICD-10-CM | POA: Diagnosis present

## 2020-09-22 DIAGNOSIS — R1110 Vomiting, unspecified: Secondary | ICD-10-CM | POA: Diagnosis not present

## 2020-09-22 DIAGNOSIS — I160 Hypertensive urgency: Secondary | ICD-10-CM | POA: Diagnosis present

## 2020-09-22 DIAGNOSIS — Z841 Family history of disorders of kidney and ureter: Secondary | ICD-10-CM | POA: Diagnosis not present

## 2020-09-22 DIAGNOSIS — M19071 Primary osteoarthritis, right ankle and foot: Secondary | ICD-10-CM | POA: Diagnosis present

## 2020-09-22 DIAGNOSIS — Z87442 Personal history of urinary calculi: Secondary | ICD-10-CM | POA: Diagnosis not present

## 2020-09-22 DIAGNOSIS — R300 Dysuria: Secondary | ICD-10-CM | POA: Diagnosis present

## 2020-09-22 DIAGNOSIS — Z800 Family history of malignant neoplasm of digestive organs: Secondary | ICD-10-CM | POA: Diagnosis not present

## 2020-09-22 DIAGNOSIS — R112 Nausea with vomiting, unspecified: Secondary | ICD-10-CM | POA: Diagnosis present

## 2020-09-22 DIAGNOSIS — Z9049 Acquired absence of other specified parts of digestive tract: Secondary | ICD-10-CM | POA: Diagnosis not present

## 2020-09-22 DIAGNOSIS — M2550 Pain in unspecified joint: Secondary | ICD-10-CM | POA: Diagnosis present

## 2020-09-22 DIAGNOSIS — M109 Gout, unspecified: Secondary | ICD-10-CM | POA: Diagnosis present

## 2020-09-22 DIAGNOSIS — M25531 Pain in right wrist: Secondary | ICD-10-CM | POA: Diagnosis not present

## 2020-09-22 DIAGNOSIS — Z8261 Family history of arthritis: Secondary | ICD-10-CM | POA: Diagnosis not present

## 2020-09-22 DIAGNOSIS — Z944 Liver transplant status: Secondary | ICD-10-CM | POA: Diagnosis not present

## 2020-09-22 DIAGNOSIS — N1832 Chronic kidney disease, stage 3b: Secondary | ICD-10-CM | POA: Diagnosis present

## 2020-09-22 DIAGNOSIS — R0902 Hypoxemia: Secondary | ICD-10-CM | POA: Diagnosis not present

## 2020-09-22 DIAGNOSIS — Z833 Family history of diabetes mellitus: Secondary | ICD-10-CM | POA: Diagnosis not present

## 2020-09-22 DIAGNOSIS — R197 Diarrhea, unspecified: Secondary | ICD-10-CM | POA: Diagnosis not present

## 2020-09-22 DIAGNOSIS — N400 Enlarged prostate without lower urinary tract symptoms: Secondary | ICD-10-CM | POA: Diagnosis present

## 2020-09-22 DIAGNOSIS — Z20822 Contact with and (suspected) exposure to covid-19: Secondary | ICD-10-CM | POA: Diagnosis present

## 2020-09-22 DIAGNOSIS — I129 Hypertensive chronic kidney disease with stage 1 through stage 4 chronic kidney disease, or unspecified chronic kidney disease: Secondary | ICD-10-CM | POA: Diagnosis present

## 2020-09-22 DIAGNOSIS — E669 Obesity, unspecified: Secondary | ICD-10-CM | POA: Diagnosis present

## 2020-09-22 DIAGNOSIS — D84821 Immunodeficiency due to drugs: Secondary | ICD-10-CM | POA: Diagnosis present

## 2020-09-22 DIAGNOSIS — Z7952 Long term (current) use of systemic steroids: Secondary | ICD-10-CM | POA: Diagnosis not present

## 2020-09-22 DIAGNOSIS — Z6836 Body mass index (BMI) 36.0-36.9, adult: Secondary | ICD-10-CM | POA: Diagnosis not present

## 2020-09-22 LAB — CBC
HCT: 41.6 % (ref 39.0–52.0)
Hemoglobin: 14.4 g/dL (ref 13.0–17.0)
MCH: 33.1 pg (ref 26.0–34.0)
MCHC: 34.6 g/dL (ref 30.0–36.0)
MCV: 95.6 fL (ref 80.0–100.0)
Platelets: 160 10*3/uL (ref 150–400)
RBC: 4.35 MIL/uL (ref 4.22–5.81)
RDW: 13.6 % (ref 11.5–15.5)
WBC: 8.8 10*3/uL (ref 4.0–10.5)
nRBC: 0 % (ref 0.0–0.2)

## 2020-09-22 LAB — URINALYSIS, COMPLETE (UACMP) WITH MICROSCOPIC
Bacteria, UA: NONE SEEN
Bilirubin Urine: NEGATIVE
Glucose, UA: NEGATIVE mg/dL
Ketones, ur: NEGATIVE mg/dL
Leukocytes,Ua: NEGATIVE
Nitrite: NEGATIVE
Protein, ur: NEGATIVE mg/dL
Specific Gravity, Urine: 1.029 (ref 1.005–1.030)
pH: 5 (ref 5.0–8.0)

## 2020-09-22 LAB — HIV ANTIBODY (ROUTINE TESTING W REFLEX): HIV Screen 4th Generation wRfx: NONREACTIVE

## 2020-09-22 LAB — BASIC METABOLIC PANEL
Anion gap: 8 (ref 5–15)
BUN: 17 mg/dL (ref 8–23)
CO2: 24 mmol/L (ref 22–32)
Calcium: 8.6 mg/dL — ABNORMAL LOW (ref 8.9–10.3)
Chloride: 107 mmol/L (ref 98–111)
Creatinine, Ser: 1.56 mg/dL — ABNORMAL HIGH (ref 0.61–1.24)
GFR, Estimated: 50 mL/min — ABNORMAL LOW (ref >60)
Glucose, Bld: 122 mg/dL — ABNORMAL HIGH (ref 70–99)
Potassium: 3.6 mmol/L (ref 3.5–5.1)
Sodium: 139 mmol/L (ref 135–145)

## 2020-09-22 LAB — C-REACTIVE PROTEIN: CRP: 0.9 mg/dL (ref <1.0)

## 2020-09-22 LAB — SEDIMENTATION RATE: Sed Rate: 4 mm/hr (ref 0–20)

## 2020-09-22 MED ORDER — MYCOPHENOLATE MOFETIL 250 MG PO CAPS
500.0000 mg | ORAL_CAPSULE | Freq: Every morning | ORAL | Status: DC
  Administered 2020-09-22 – 2020-09-24 (×3): 500 mg via ORAL
  Filled 2020-09-22 (×3): qty 2

## 2020-09-22 MED ORDER — MYCOPHENOLATE MOFETIL 250 MG PO CAPS
250.0000 mg | ORAL_CAPSULE | Freq: Every evening | ORAL | Status: DC
  Administered 2020-09-22 – 2020-09-23 (×2): 250 mg via ORAL
  Filled 2020-09-22 (×4): qty 1

## 2020-09-22 MED ORDER — MORPHINE SULFATE (PF) 2 MG/ML IV SOLN
1.0000 mg | INTRAVENOUS | Status: DC | PRN
  Administered 2020-09-22: 1 mg via INTRAVENOUS
  Filled 2020-09-22: qty 1

## 2020-09-22 MED ORDER — TACROLIMUS 0.5 MG PO CAPS
0.5000 mg | ORAL_CAPSULE | ORAL | Status: DC
Start: 2020-09-22 — End: 2020-09-22

## 2020-09-22 MED ORDER — ONDANSETRON HCL 4 MG/2ML IJ SOLN: 4.0000 mg | Freq: Four times a day (QID) | INTRAMUSCULAR | Status: DC | PRN

## 2020-09-22 MED ORDER — ONDANSETRON HCL 4 MG PO TABS: 4.0000 mg | ORAL_TABLET | Freq: Four times a day (QID) | ORAL | Status: DC | PRN

## 2020-09-22 MED ORDER — DICLOFENAC SODIUM 1 % EX GEL
2.0000 g | Freq: Four times a day (QID) | CUTANEOUS | Status: DC
  Administered 2020-09-22 – 2020-09-24 (×3): 2 g via TOPICAL
  Filled 2020-09-22: qty 100

## 2020-09-22 MED ORDER — TACROLIMUS 1 MG PO CAPS
1.0000 mg | ORAL_CAPSULE | Freq: Every morning | ORAL | Status: DC
  Administered 2020-09-22 – 2020-09-24 (×3): 1 mg via ORAL
  Filled 2020-09-22 (×4): qty 1

## 2020-09-22 MED ORDER — AMLODIPINE BESYLATE 5 MG PO TABS
5.0000 mg | ORAL_TABLET | Freq: Every day | ORAL | Status: DC
  Administered 2020-09-22: 5 mg via ORAL
  Filled 2020-09-22: qty 1

## 2020-09-22 MED ORDER — PREDNISONE 20 MG PO TABS: 30.0000 mg | ORAL_TABLET | Freq: Every day | ORAL | Status: DC

## 2020-09-22 MED ORDER — SODIUM CHLORIDE 0.9 % IV SOLN: INTRAVENOUS | Status: DC

## 2020-09-22 MED ORDER — TACROLIMUS 0.5 MG PO CAPS
0.5000 mg | ORAL_CAPSULE | Freq: Every day | ORAL | Status: DC
  Administered 2020-09-22 – 2020-09-23 (×2): 0.5 mg via ORAL
  Filled 2020-09-22 (×3): qty 1

## 2020-09-22 MED ORDER — MYCOPHENOLATE MOFETIL 250 MG PO CAPS: 250.0000 mg | ORAL_CAPSULE | Freq: Two times a day (BID) | ORAL | Status: DC

## 2020-09-22 MED ORDER — TAMSULOSIN HCL 0.4 MG PO CAPS
0.4000 mg | ORAL_CAPSULE | Freq: Every day | ORAL | Status: DC
  Administered 2020-09-22 – 2020-09-24 (×3): 0.4 mg via ORAL
  Filled 2020-09-22 (×3): qty 1

## 2020-09-22 MED ORDER — COLCHICINE 0.6 MG PO TABS
0.6000 mg | ORAL_TABLET | Freq: Two times a day (BID) | ORAL | Status: DC
  Filled 2020-09-22: qty 1

## 2020-09-22 MED ORDER — HYDROCODONE-ACETAMINOPHEN 5-325 MG PO TABS: 1.0000 | ORAL_TABLET | ORAL | Status: DC | PRN

## 2020-09-22 MED ORDER — MORPHINE SULFATE (PF) 2 MG/ML IV SOLN
2.0000 mg | INTRAVENOUS | Status: DC | PRN
  Administered 2020-09-22: 2 mg via INTRAVENOUS
  Filled 2020-09-22: qty 1

## 2020-09-22 MED ORDER — ENOXAPARIN SODIUM 60 MG/0.6ML ~~LOC~~ SOLN
0.5000 mg/kg | SUBCUTANEOUS | Status: DC
  Administered 2020-09-22 – 2020-09-24 (×3): 60 mg via SUBCUTANEOUS
  Filled 2020-09-22 (×3): qty 0.6

## 2020-09-22 MED ORDER — COLCHICINE 0.6 MG PO TABS
0.6000 mg | ORAL_TABLET | Freq: Two times a day (BID) | ORAL | Status: DC | PRN
  Filled 2020-09-22: qty 1

## 2020-09-22 MED ORDER — HYDRALAZINE HCL 10 MG PO TABS
10.0000 mg | ORAL_TABLET | Freq: Four times a day (QID) | ORAL | Status: DC | PRN
  Filled 2020-09-22: qty 1

## 2020-09-22 MED ORDER — MORPHINE SULFATE (PF) 2 MG/ML IV SOLN
2.0000 mg | INTRAVENOUS | Status: DC | PRN
Start: 2020-09-22 — End: 2020-09-22
  Administered 2020-09-22: 03:00:00 2 mg via INTRAVENOUS
  Filled 2020-09-22: qty 1

## 2020-09-22 MED ORDER — ACETAMINOPHEN 325 MG PO TABS
650.0000 mg | ORAL_TABLET | Freq: Four times a day (QID) | ORAL | Status: DC | PRN
  Administered 2020-09-23: 04:00:00 650 mg via ORAL
  Filled 2020-09-22: qty 2

## 2020-09-22 MED ORDER — PREDNISONE 20 MG PO TABS
40.0000 mg | ORAL_TABLET | Freq: Every day | ORAL | Status: DC
  Administered 2020-09-23 – 2020-09-24 (×2): 40 mg via ORAL
  Filled 2020-09-22 (×2): qty 2

## 2020-09-22 MED ORDER — PREDNISONE 20 MG PO TABS
40.0000 mg | ORAL_TABLET | Freq: Every day | ORAL | Status: DC
  Administered 2020-09-22: 40 mg via ORAL
  Filled 2020-09-22: qty 2

## 2020-09-22 MED ORDER — ACETAMINOPHEN 650 MG RE SUPP: 650.0000 mg | Freq: Four times a day (QID) | RECTAL | Status: DC | PRN

## 2020-09-22 MED ORDER — PANTOPRAZOLE SODIUM 40 MG PO TBEC
40.0000 mg | DELAYED_RELEASE_TABLET | Freq: Every day | ORAL | Status: DC
  Administered 2020-09-22 – 2020-09-24 (×3): 40 mg via ORAL
  Filled 2020-09-22 (×3): qty 1

## 2020-09-22 MED ORDER — LABETALOL HCL 5 MG/ML IV SOLN
10.0000 mg | Freq: Four times a day (QID) | INTRAVENOUS | Status: DC | PRN
  Administered 2020-09-22: 09:00:00 10 mg via INTRAVENOUS
  Filled 2020-09-22: qty 4

## 2020-09-22 NOTE — Telephone Encounter (Signed)
Please check and see what is going on with him Saturday clinic may be an option as well

## 2020-09-22 NOTE — Telephone Encounter (Signed)
Spoke to pt. He went to the ER yesterday and has been admitted. Will most likely be transferred to [facility].

## 2020-09-22 NOTE — TOC Initial Note (Signed)
Transition of Care [facility]) - Initial/Assessment Note    Patient Details  Name: Devin White MRN: [id number] Date of Birth: Oct 21, 1955  Transition of Care [facility]) CM/SW Contact:    Allayne Butcher, RN Phone Number: 09/22/2020, 10:47 AM  Clinical Narrative:                 Patient placed under observation for vomiting and diarrhea and ankle and wrist pain.  RNCM met with patient at the bedside and reviewed MOON.  Patient has a history of liver transplant in 2009, follow at [facility].  Patient's granddaughter lives with him in [place] and he is independent at home and drives.  No discharge needs identified at this time.    Expected Discharge Plan: Home/Self Care Barriers to Discharge: Continued Medical Work up   Patient Goals and CMS Choice Patient states their goals for this hospitalization and ongoing recovery are:: To find out what is causing the ankle and wrist pain      Expected Discharge Plan and Services Expected Discharge Plan: Home/Self Care       Living arrangements for the past 2 months: Single Family Home                                      Prior Living Arrangements/Services Living arrangements for the past 2 months: Single Family Home Lives with:: Relatives (granddaughter) Patient language and need for interpreter reviewed:: Yes Do you feel safe going back to the place where you live?: Yes      Need for Family Participation in Patient Care: Yes (Comment) Care giver support system in place?: Yes (comment) (granddaughter) Current home services: DME (walker) Criminal Activity/Legal Involvement Pertinent to Current Situation/Hospitalization: No - Comment as needed  Activities of Daily Living Home Assistive Devices/Equipment: None ADL Screening (condition at time of admission) Patient's cognitive ability adequate to safely complete daily activities?: Yes Is the patient deaf or have difficulty hearing?: No Does the patient have difficulty seeing, even when wearing  glasses/contacts?: No Does the patient have difficulty concentrating, remembering, or making decisions?: No Patient able to express need for assistance with ADLs?: Yes Does the patient have difficulty dressing or bathing?: No Independently performs ADLs?: Yes (appropriate for developmental age) Does the patient have difficulty walking or climbing stairs?: No Weakness of Legs: None Weakness of Arms/Hands: None  Permission Sought/Granted Permission sought to share information with : Case Manager,Family Supports Permission granted to share information with : Yes, Verbal Permission Granted  Share Information with NAME: Reuel Boom     Permission granted to share info w Relationship: granddaughter     Emotional Assessment Appearance:: Appears stated age Attitude/Demeanor/Rapport: Engaged Affect (typically observed): Accepting Orientation: : Oriented to Self,Oriented to Place,Oriented to  Time,Oriented to Situation Alcohol / Substance Use: Not Applicable Psych Involvement: No (comment)  Admission diagnosis:  Vomiting [R11.10] Patient Active Problem List   Diagnosis Date Noted  . Hypertensive urgency 09/22/2020  . Vomiting and diarrhea 09/21/2020  . Recurrent UTI 03/26/2019  . Cystitis 12/01/2018  . Pulmonary nodule 08/04/2018  . Liver transplant status (HCC) 10/10/2017  . Advance directive discussed with patient 10/10/2017  . Stage 3b chronic kidney disease (HCC) 10/09/2016  . Mallory-Weiss tear 04/16/2016  . Long-term use of immunosuppressant medication 04/08/2016  . Sleep apnea 10/06/2015  . 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 Immunosuppression ([facility]) 06/01/2012  . Hypertension   . Routine  general medical examination at a health care facility 04/26/2011  . Chronic liver failure ([place]) 09/24/2010  . GOUT 12/21/2008  . BPH without urinary obstruction 12/21/2008  . ALLERGIC RHINITIS 05/22/2007  . GERD 05/22/2007  . HIATAL HERNIA 05/22/2007   . IRRITABLE BOWEL SYNDROME, HX OF 05/22/2007  . RENAL CALCULUS, HX OF 05/22/2007   PCP:  Venia Carbon, MD Pharmacy:   [place], [place], [place] [address], [place] [address] Phone: [phone] Fax: [phone]  [facility] [id number] [place], [place] - [place] [address] [place] [address] Phone: [phone] Fax: [phone]     Social Determinants of Health (SDOH) Interventions    Readmission Risk Interventions No flowsheet data found.

## 2020-09-22 NOTE — Progress Notes (Signed)
PHARMACIST - PHYSICIAN COMMUNICATION  CONCERNING:  Enoxaparin (Lovenox) for DVT Prophylaxis    RECOMMENDATION: Patient was prescribed enoxaprin 40mg  q24 hours for VTE prophylaxis.   Filed Weights   09/21/20 1851  Weight: 120.2 kg (265 lb)    Body mass index is 34.96 kg/m.  Estimated Creatinine Clearance: 60.5 mL/min (A) (by C-G formula based on SCr of 1.72 mg/dL (H)).   Based on [place] patient is candidate for enoxaparin 0.5mg /kg TBW SQ every 24 hours based on BMI being >30.  DESCRIPTION: Pharmacy has adjusted enoxaparin dose per [facility] policy.  Patient is now receiving enoxaparin 0.5 mg/kg every 24 hours   Renda Rolls, PharmD, [facility] 09/22/2020 12:43 AM

## 2020-09-22 NOTE — Care Management Obs Status (Signed)
MEDICARE OBSERVATION STATUS NOTIFICATION   Patient Details  Name: Devin White MRN: [id number] Date of Birth: May 31, 1956   Medicare Observation Status Notification Given:  Yes    Shelbie Hutching, RN 09/22/2020, 9:53 AM

## 2020-09-22 NOTE — Progress Notes (Addendum)
Patient placed in observation after midnight but care began prior to midnight in the ER, please see H&P.  Here with  Multiple complaints 1. N//V/D-- appears resolved as patient asking for diet advancement 2. Right ankle/heel pain and wrist pain.  Unclear.  Appears that in the ER, his subcutaneous calcaneal bursa was aspirated but no fluid sent-- just culture (pending)-- stain with no WBCs--- patient denies this is even close to his gout flares in the past 3. HTN- PRN and control pain  Patient stated he was hospitalized for simlar complaints in the past (3-5 years ago) at [facility].  I reviewed his chart and he had hematemesis at that time and AVMs.  Eulogio Bear DO   Discussed case with [facility] MD.  Martin Majestic over medications (prednisone and norvacs). He was in agreement with plan of care. Norvasc is first line for liver transplant patients.  Beds are not available currently and they do not think he needs to be transferred but if something changes and he worsens or is unable to take his transplant meds- to call back. JV

## 2020-09-22 NOTE — H&P (Signed)
History and Physical    Devin White ZOX:[id number] DOB: [birth year] DOA: 09/21/2020  PCP: Venia Carbon, MD   Patient coming from: Home  I have personally briefly reviewed patient's old medical records in [facility]  Chief Complaint: Vomiting and diarrhea, joint pain  HPI: Devin White is a 65 y.o. male with medical history significant for liver transplant in 2009 secondary to NAFLD on chronic immune suppressive therapy, gout, BPH, GERD, HTN who presents to the emergency room with a 1 day history of nausea vomiting and diarrhea for oral intake.  Vomiting is nonbloody nonbilious and consists of clear gastric contents.  Diarrhea is runny, with no blood and not dark.  He has occasional right-sided abdominal cramping.  Denies sick contacts and denies ingestion of food out of the ordinary.  Additionally he complains of gout pain in his right wrist for the past month but which has recently improved and also of a swelling behind his right ankle.  He denies dysuria ED Course: On arrival, BP 178/116 pulse 84 O2 sat 96% on room air and temperature 99.1.  Blood work significant for creatinine of 1.72.  Uric acid 10.8, troponin 24.  CBC completely normal and with normal lactic acid.  Sed rate of 4.  Urinalysis unremarkable.  COVID and flu test negative Imaging: CTA aorta showed no acute aortic syndrome or other acute abnormality of the chest abdomen or pelvis  Patient had aspiration of a blister behind the right Achilles tendon.  Fluid count pending.  Hospitalist consulted for admission due to concern about BP and elevated troponin.  Review of Systems: As per HPI otherwise all other systems on review of systems negative.    Past Medical History:  Diagnosis Date  . Arthritis    back and legs  . Benign prostatic hypertrophy   . GERD (gastroesophageal reflux disease)   . Gout   . History of blood transfusion    pt has antibodies in his blood since previous transfusions  . History of  cirrhosis of liver S/P TRANSPLANT 2009  . History of liver failure S/P TRANSPLANT  . Hypertension   . Left ureteral calculus     Past Surgical History:  Procedure Laterality Date  . bone morrow biopsy    . CHOLECYSTECTOMY  2007  . CYSTO/ LEFT RETROGRADE PYELOGRAM/ LEFT URETERAL STENT PLACEMENT  03-05-2012  DR Tresa Moore [facility])   LEFT URETERAL CALCULI  . CYSTOSCOPY W/ URETERAL STENT PLACEMENT  03/11/2012   Procedure: CYSTOSCOPY WITH STENT REPLACEMENT;  Surgeon: Alexis Frock, MD;  Location: [facility];  Service: Urology;  Laterality: Left;  . HERNIA REPAIR  2008  . LIVER BIOPSY    . LIVER TRANSPLANT  11/24/2007   pt states doing well since liver transplant  . LUMBAR DISC SURGERY    . LUMBAR FUSION    . removal of fistula    . URETEROSCOPY  03/11/2012   Procedure: URETEROSCOPY;  Surgeon: Alexis Frock, MD;  Location: [facility];  Service: Urology;  Laterality: Left;   STONE MANIPULATION, stone obtained  [place] [facility]     reports that he has never smoked. He has never used smokeless tobacco. He reports that he does not drink alcohol and does not use drugs.  No Known Allergies  Family History  Problem Relation Age of Onset  . Cancer Mother        colon  . Arthritis Mother   . Vasculitis Mother   . Hypertension Mother   .  Cancer Father        colon  . Kidney disease Father   . Arthritis Father   . Arthritis Brother   . Alcohol abuse Maternal Aunt   . Diabetes Maternal Aunt   . Alcohol abuse Maternal Uncle   . Diabetes Paternal Aunt   . Alcohol abuse Maternal Uncle   . Alcohol abuse Maternal Uncle       Prior to Admission medications   Medication Sig Start Date End Date Taking? Authorizing Provider  calcium carbonate (TUMS) 500 MG chewable tablet Chew 1 tablet (200 mg of elemental calcium total) by mouth 2 (two) times daily as needed for indigestion or heartburn. 06/26/16  Yes Tonia Ghent, MD  colchicine 0.6 MG tablet Take 1 tablet  (0.6 mg total) by mouth 2 (two) times daily as needed. 06/30/20  Yes Venia Carbon, MD  mycophenolate (CELLCEPT) 250 MG capsule Take 250-500 mg by mouth 2 (two) times daily. 2 capsules (500 mg) in the morning and one capsule (250 mg) in the evening 02/04/19  Yes [provider]  omeprazole (PRILOSEC) 20 MG capsule TAKE 1 CAPSULE BY MOUTH TWICE DAILY 02/28/20  Yes Venia Carbon, MD  predniSONE (DELTASONE) 5 MG tablet Take 2 tablets (10 mg total) by mouth daily with breakfast. Patient taking differently: Take 5 mg by mouth daily with breakfast. 08/09/18  Yes Irene Pap N, DO  tacrolimus (PROGRAF) 0.5 MG capsule Take 0.5-1 mg by mouth See admin instructions. Take 2 capsules (1mg ) by mouth every morning and 1 capsule (0.5mg ) by mouth every night   Yes [provider]  tamsulosin (FLOMAX) 0.4 MG CAPS capsule Take 1 capsule (0.4 mg total) by mouth daily. 06/23/20  Yes Venia Carbon, MD    Physical Exam: Vitals:   09/21/20 2115 09/21/20 2200 09/21/20 2243 09/21/20 2330  BP: (!) 192/115 (!) 207/135 (!) 190/125 (!) 175/94  Pulse: 81 72 77 75  Resp: 16 12 17 14   Temp:      TempSrc:      SpO2: 93% 94% 96% 91%  Weight:      Height:         Vitals:   09/21/20 2115 09/21/20 2200 09/21/20 2243 09/21/20 2330  BP: (!) 192/115 (!) 207/135 (!) 190/125 (!) 175/94  Pulse: 81 72 77 75  Resp: 16 12 17 14   Temp:      TempSrc:      SpO2: 93% 94% 96% 91%  Weight:      Height:          Constitutional: Alert and oriented x 3 . Not in any apparent distress HEENT:      Head: Normocephalic and atraumatic.         Eyes: PERLA, EOMI, Conjunctivae are normal. Sclera is non-icteric.       Mouth/Throat: Mucous membranes are moist.       Neck: Supple with no signs of meningismus. Cardiovascular: Regular rate and rhythm. No murmurs, gallops, or rubs. 2+ symmetrical distal pulses are present . No JVD. No LE edema Respiratory: Respiratory effort normal .Lungs sounds clear bilaterally.  No wheezes, crackles, or rhonchi.  Gastrointestinal: Soft, non tender, and non distended with positive bowel sounds.  Genitourinary: No CVA tenderness. Musculoskeletal: Nontender with normal range of motion in all extremities.  Though mild pain on range of motion of right wrist.  Nontender fluid collection posterior to the right ankle. No cyanosis, or erythema of extremities. Neurologic:  Face is symmetric. Moving all extremities. No  gross focal neurologic deficits . Skin: Skin is warm, dry.  No rash or ulcers Psychiatric: Mood and affect are normal    Labs on Admission: I have personally reviewed following labs and imaging studies  CBC: Recent Labs  Lab 09/21/20 2039  WBC 9.6  NEUTROABS 6.9  HGB 15.5  HCT 45.2  MCV 97.0  PLT 195   Basic Metabolic Panel: Recent Labs  Lab 09/21/20 2039  NA 141  K 3.6  CL 106  CO2 26  GLUCOSE 129*  BUN 19  CREATININE 1.72*  CALCIUM 8.9   GFR: Estimated Creatinine Clearance: 60.5 mL/min (A) (by C-G formula based on SCr of 1.72 mg/dL (H)). Liver Function Tests: Recent Labs  Lab 09/21/20 2039  AST 27  ALT 44  ALKPHOS 47  BILITOT 1.4*  PROT 6.3*  ALBUMIN 3.8   No results for input(s): LIPASE, AMYLASE in the last 168 hours. No results for input(s): AMMONIA in the last 168 hours. Coagulation Profile: No results for input(s): INR, PROTIME in the last 168 hours. Cardiac Enzymes: No results for input(s): CKTOTAL, CKMB, CKMBINDEX, TROPONINI in the last 168 hours. BNP (last 3 results) No results for input(s): PROBNP in the last 8760 hours. HbA1C: No results for input(s): HGBA1C in the last 72 hours. CBG: No results for input(s): GLUCAP in the last 168 hours. Lipid Profile: No results for input(s): CHOL, HDL, LDLCALC, TRIG, CHOLHDL, LDLDIRECT in the last 72 hours. Thyroid Function Tests: No results for input(s): TSH, T4TOTAL, FREET4, T3FREE, THYROIDAB in the last 72 hours. Anemia Panel: No results for input(s): VITAMINB12, FOLATE,  FERRITIN, TIBC, IRON, RETICCTPCT in the last 72 hours. Urine analysis:    Component Value Date/Time   COLORURINE YELLOW (A) 09/21/2020 2346   APPEARANCEUR CLEAR (A) 09/21/2020 2346   APPEARANCEUR Clear 11/30/2013 1959   LABSPEC 1.029 09/21/2020 2346   LABSPEC 1.017 11/30/2013 1959   PHURINE 5.0 09/21/2020 2346   GLUCOSEU NEGATIVE 09/21/2020 2346   GLUCOSEU Negative 11/30/2013 1959   HGBUR SMALL (A) 09/21/2020 2346   HGBUR negative 05/25/2010 1153   [place] 09/21/2020 2346   BILIRUBINUR Negative 11/30/2013 1959   KETONESUR NEGATIVE 09/21/2020 2346   PROTEINUR NEGATIVE 09/21/2020 2346   UROBILINOGEN 0.2 08/25/2012 2256   NITRITE NEGATIVE 09/21/2020 2346   LEUKOCYTESUR NEGATIVE 09/21/2020 2346   LEUKOCYTESUR Negative 11/30/2013 1959    Radiological Exams on Admission: CT Head Wo Contrast  Result Date: 09/21/2020 CLINICAL DATA:  Vomiting and diarrhea. EXAM: CT HEAD WITHOUT CONTRAST TECHNIQUE: Contiguous axial images were obtained from the base of the skull through the vertex without intravenous contrast. COMPARISON:  07/20/2011 FINDINGS: Brain: No evidence of acute infarction, hemorrhage, hydrocephalus, extra-axial collection or mass lesion/mass effect. Mild diffuse cerebral atrophy. Patchy low-attenuation changes in the deep white matter likely representing small vessel ischemic change. Vascular: Mild intracranial arterial vascular calcifications are present. Skull: The calvarium appears intact. Sinuses/Orbits: Small retention cyst in the sphenoid sinus. Paranasal sinuses and mastoid air cells are otherwise clear. Other: None. IMPRESSION: 1. No acute intracranial abnormalities. 2. Mild diffuse cerebral atrophy and small vessel ischemic changes. Electronically Signed   By: Lucienne Capers M.D.   On: 09/21/2020 22:29   DG Chest Portable 1 View  Result Date: 09/21/2020 CLINICAL DATA:  Fever. EXAM: PORTABLE CHEST 1 VIEW COMPARISON:  Chest radiograph 08/11/2019 FINDINGS: Lung  volumes are low.The cardiomediastinal contours are normal. Pulmonary vasculature is normal. No consolidation, pleural effusion, or pneumothorax. No acute osseous abnormalities are seen. IMPRESSION: Low lung volumes without acute  abnormality. Electronically Signed   By: Keith Rake M.D.   On: 09/21/2020 20:08   CT Angio Chest/Abd/Pel for Dissection W and/or Wo Contrast  Result Date: 09/21/2020 CLINICAL DATA:  Abdominal pain with vomiting and diarrhea EXAM: CT ANGIOGRAPHY CHEST, ABDOMEN AND PELVIS TECHNIQUE: Non-contrast CT of the chest was initially obtained. Multidetector CT imaging through the chest, abdomen and pelvis was performed using the standard protocol during bolus administration of intravenous contrast. Multiplanar reconstructed images and MIPs were obtained and reviewed to evaluate the vascular anatomy. CONTRAST:  30mL OMNIPAQUE IOHEXOL 350 MG/ML SOLN COMPARISON:  None. FINDINGS: CTA CHEST FINDINGS Cardiovascular: --Heart: The heart size is normal.  There is nopericardial effusion. --Aorta: The course and caliber of the thoracic aorta are normal. There is mild aortic atherosclerotic calcification. Precontrast images show no aortic intramural hematoma. There is no blood pool, dissection or penetrating ulcer demonstrated on arterial phase postcontrast imaging. There is a conventional 3 vessel aortic arch branching pattern. The proximal arch vessels are widely patent. --Pulmonary Arteries: Contrast timing is optimized for preferential opacification of the aorta. Within that limitation, normal central pulmonary arteries. Mediastinum/Nodes: No mediastinal, hilar or axillary lymphadenopathy. The visualized thyroid and thoracic esophageal course are unremarkable. Lungs/Pleura: No pulmonary nodules or masses. No pleural effusion or pneumothorax. No focal airspace consolidation. No focal pleural abnormality. Musculoskeletal: No chest wall abnormality. No acute osseous findings. Review of the MIP images  confirms the above findings. CTA ABDOMEN AND PELVIS FINDINGS VASCULAR Aorta: Normal caliber aorta without aneurysm, dissection, vasculitis or hemodynamically significant stenosis. There is mild aortic atherosclerosis. Celiac: No aneurysm, dissection or hemodynamically significant stenosis. Normal branching pattern. SMA: Widely patent without dissection or stenosis. Renals: Single renal arteries bilaterally. No aneurysm, dissection, stenosis or evidence of fibromuscular dysplasia. IMA: Patent without abnormality. Inflow: No aneurysm, stenosis or dissection. Veins: Normal course and caliber of the major veins. Assessment is otherwise limited by the arterial dominant contrast phase. Review of the MIP images confirms the above findings. NON-VASCULAR Hepatobiliary: Status post liver transplant.  Gallbladder absent. Pancreas: Normal contours without ductal dilatation. No peripancreatic fluid collection. Spleen: Normal arterial phase splenic enhancement pattern. Adrenals/Urinary Tract: --Adrenal glands: Normal. --Right kidney/ureter: Mild cortical thinning. Simple renal cyst measures 2.4 cm. --Left kidney/ureter: Mild atrophy.  Simple cyst measures 2.6 cm. --Urinary bladder: Unremarkable. Stomach/Bowel: --Stomach/Duodenum: No hiatal hernia or other gastric abnormality. Normal duodenal course and caliber. --Small bowel: No dilatation or inflammation. --Colon: No focal abnormality. --Appendix: Normal. Lymphatic:  No abdominal or pelvic lymphadenopathy. Reproductive: Normal prostate and seminal vesicles. Musculoskeletal. No bony spinal canal stenosis or focal osseous abnormality. Other: None. Review of the MIP images confirms the above findings. IMPRESSION: 1. No acute aortic syndrome or other acute abnormality of the chest, abdomen or pelvis. Aortic Atherosclerosis (ICD10-I70.0). Electronically Signed   By: Ulyses Jarred M.D.   On: 09/21/2020 22:34     Assessment/Plan 65 year old male with history of liver transplant in  2009 secondary to NAFLD on chronic immune suppressive therapy, gout, BPH, GERD, HTN who presents with a 1 day history of nausea vomiting and diarrhea with decreased oral intake.  Also has joint pains.  BP elevated as high as 203/118 in the ER.     Vomiting and diarrhea -Suspect viral gastroenteritis -IV hydration and supportive care -Clear liquid diet and advance as tolerated    Hypertensive urgency -BP as high as 203/118 in the ER, possibly related to acute illness and acute gouty pain -CTA aorta showed no acute aortic syndrome or other acute abnormality  of the chest abdomen or pelvis -Labetalol as needed  Elevated troponin -Troponin mildly elevated at 24, likely demand ischemia from related to pain, elevated blood pressure -EKG nonacute    GOUT flare -Uric acid 10.8 -We will avoid NSAIDs due to kidney disease -Colchicine, prednisone x 5 days and mild narcotics -Prednisone if no relief -Follow-up fluid analysis from aspiration done in the ER of fluid collection      Stage 3b chronic kidney disease ([place]) -Creatinine 1.72 which is his baseline    Liver transplant status ([place]) Long-term use of immunosuppressant medication -Continue CellCept and Prograf    DVT prophylaxis: Lovenox  Code Status: full code  Family Communication:  none  Disposition Plan: Back to previous home environment Consults called: none  Status: Observation    Athena Masse MD [facility]     09/22/2020, 12:32 AM

## 2020-09-22 NOTE — Progress Notes (Signed)
Lab called and informed me they do not have synovial fluid to complete ordered add on lab.  To notify MD at this time

## 2020-09-22 NOTE — ED Provider Notes (Signed)
Assumed care. Pt is a 65 yo M with complex PMHx including liver transplant, here with R wrist, R heel pain and n/v. Pt notably hypertensive here, which I suspect is due to pain btu could be contributing to his reported CP, nausea. Pt also c/o diffuse back pain and R side "tingling," so CT head and dissection obtained, reviewed, and are unremarkable.   Suspect possible HTN urgency in setting of pain from polyarthritis. Unclear etiology for his arthritis. Suspect gout though he is adamant he has never had gout in his ankle or his wrist. After discussing management options, needle aspiration performed of R ankle bursa using sterile technique, with return of thick white drainage. Wound culture sent. Will admit for control of HTN, pain management, f/u of wound culture.   Duffy Bruce, MD 09/22/20 [phone]

## 2020-09-22 NOTE — Plan of Care (Signed)
  Problem: Health Behavior/Discharge Planning: Goal: Ability to manage health-related needs will improve Outcome: Progressing   Problem: Clinical Measurements: Goal: Ability to maintain clinical measurements within normal limits will improve Outcome: Progressing Goal: Will remain free from infection Outcome: Progressing Goal: Diagnostic test results will improve Outcome: Progressing Goal: Respiratory complications will improve Outcome: Progressing Goal: Cardiovascular complication will be avoided Outcome: Progressing   Problem: Pain Managment: Goal: General experience of comfort will improve Outcome: Progressing  Patient admitted to unit floor. AXO. C/O right foot pain and right writs pain. IV morphine given. IVF.

## 2020-09-23 ENCOUNTER — Inpatient Hospital Stay: Payer: Medicare Other

## 2020-09-23 DIAGNOSIS — R197 Diarrhea, unspecified: Secondary | ICD-10-CM | POA: Diagnosis not present

## 2020-09-23 DIAGNOSIS — R1110 Vomiting, unspecified: Secondary | ICD-10-CM | POA: Diagnosis not present

## 2020-09-23 LAB — CBC
HCT: 42.7 % (ref 39.0–52.0)
Hemoglobin: 14.9 g/dL (ref 13.0–17.0)
MCH: 33.3 pg (ref 26.0–34.0)
MCHC: 34.9 g/dL (ref 30.0–36.0)
MCV: 95.5 fL (ref 80.0–100.0)
Platelets: 156 10*3/uL (ref 150–400)
RBC: 4.47 MIL/uL (ref 4.22–5.81)
RDW: 13.4 % (ref 11.5–15.5)
WBC: 9.7 10*3/uL (ref 4.0–10.5)
nRBC: 0 % (ref 0.0–0.2)

## 2020-09-23 LAB — URINE CULTURE: Culture: NO GROWTH

## 2020-09-23 LAB — COMPREHENSIVE METABOLIC PANEL
ALT: 35 U/L (ref 0–44)
AST: 20 U/L (ref 15–41)
Albumin: 3.6 g/dL (ref 3.5–5.0)
Alkaline Phosphatase: 42 U/L (ref 38–126)
Anion gap: 9 (ref 5–15)
BUN: 15 mg/dL (ref 8–23)
CO2: 25 mmol/L (ref 22–32)
Calcium: 9 mg/dL (ref 8.9–10.3)
Chloride: 108 mmol/L (ref 98–111)
Creatinine, Ser: 1.42 mg/dL — ABNORMAL HIGH (ref 0.61–1.24)
GFR, Estimated: 56 mL/min — ABNORMAL LOW (ref >60)
Glucose, Bld: 129 mg/dL — ABNORMAL HIGH (ref 70–99)
Potassium: 3.5 mmol/L (ref 3.5–5.1)
Sodium: 142 mmol/L (ref 135–145)
Total Bilirubin: 1.2 mg/dL (ref 0.3–1.2)
Total Protein: 6.1 g/dL — ABNORMAL LOW (ref 6.5–8.1)

## 2020-09-23 LAB — URINALYSIS, ROUTINE W REFLEX MICROSCOPIC
Bacteria, UA: NONE SEEN
Bilirubin Urine: NEGATIVE
Glucose, UA: NEGATIVE mg/dL
Ketones, ur: NEGATIVE mg/dL
Leukocytes,Ua: NEGATIVE
Nitrite: NEGATIVE
Protein, ur: NEGATIVE mg/dL
Specific Gravity, Urine: 1.01 (ref 1.005–1.030)
Squamous Epithelial / HPF: NONE SEEN (ref 0–5)
pH: 5 (ref 5.0–8.0)

## 2020-09-23 MED ORDER — OXYCODONE HCL 5 MG PO TABS
5.0000 mg | ORAL_TABLET | ORAL | Status: DC | PRN
  Administered 2020-09-23 (×2): 5 mg via ORAL
  Filled 2020-09-23 (×2): qty 1

## 2020-09-23 MED ORDER — AMLODIPINE BESYLATE 10 MG PO TABS
10.0000 mg | ORAL_TABLET | Freq: Every day | ORAL | Status: DC
  Administered 2020-09-23 – 2020-09-24 (×2): 10 mg via ORAL
  Filled 2020-09-23 (×2): qty 1

## 2020-09-23 NOTE — Progress Notes (Signed)
Urine sent to lab per order.

## 2020-09-23 NOTE — Progress Notes (Signed)
Progress Note    Devin White  HER:[id number] DOB: May 29, 1956  DOA: 09/21/2020 PCP: Venia Carbon, MD    Brief Narrative:     Medical records reviewed and are as summarized below:  Devin White is an 65 y.o. male with medical history significant for liver transplant in 2009 secondary to NAFLD on chronic immune suppressive therapy, gout, BPH, GERD, HTN who presents to the emergency room with a 1 day history of nausea vomiting and diarrhea for oral intake.   Additionally he complains of pain in his right wrist for the past month but which has recently improved and also of a swelling behind his right ankle.  BP was also found to be elevated  Apparently patient has been in contact with his liver transplant coordinator who patient states insists that he needs an antibiotic and transfer to [facility] for further management.  I called the transfer center and discussed patient with the transplant physician on call and reviewed his labs, vital signs, and medications as well as plan for norvasc and gout treatment and transplant physician was in agreement with current plan and thought perhaps patient would return home soon and can follow-up outpatient.  They do not have any beds available for transfer nor does patient currently need to be transferred  Patient states this episode is exactly like his prior episode that he had in 2015.  I reviewed in depth his chart from 2015.  He presented with multiple complaints at that time as well.  Patient insisted at that time that he had an infection and was placed on antibiotics.  Per chart review it appears that he was seen by  orthopedics: clinical presentation most consistent with gout and not septic arthritis given nonpainful ROM - recommend continued treatment for gout flare -The cultures were negative and there was no monos sodium urate crystals noted either. - the patient right ankle arthritis that improved and stable complains of pain but  was noted the to have resolved although the swelling inflammation and erythema. - The patient was seen by [facility] infectious disease and the overall impression was that this is a gout arthropathy and not an infectious process. Given the negative blood cultures, negative signed no feel cultures and the lack of fevers.  -follow up with PCP: "I know my body and I know I have an infection" Right toe still not right--- not tender though Worried it is infection  Getting shaking, chills and flank pain (where transplant liver is) Teeth pain even Neck, arms, everything  Seen at [facility] 3 weeks ago--no diagnosis Was told to restart prednisone  Doesn't feel that he is any better Didn't help right great toe  Went to ER at [facility]---still with the systemic symptoms and worried about worsening back pain CT showed kidney stones but no obstruction Still with chills and then sweats Lots of testing--stayed overnight---- still no diagnosis   Assessment/Plan:   Principal Problem:   Vomiting and diarrhea Active Problems:   GOUT   Long-term use of immunosuppressant medication   Stage 3b chronic kidney disease ([place])   Liver transplant status ([place])   Hypertensive urgency   Joint pain    Vomiting and diarrhea -resolved, patient eating well  Dysuria -U/A negative x 2 -PVR pending    Hypertensive urgency -norvasc started  Elevated troponin -Troponin mildly elevated at 24, likely demand ischemia from related to pain, elevated blood pressure -EKG nonacute    GOUT flare -Uric acid 10.8 -We will avoid  NSAIDs due to kidney disease -prednisone x 5 days and mild narcotics -No fluid analysis from aspiration done in the ER --- just culture- NGTD      Stage 3b chronic kidney disease (HCC) -Creatinine stable    Liver transplant status ([place]) Long-term use of immunosuppressant medication -Continue CellCept and Prograf  obesity Body mass index is 36.18 kg/m.   PT eval and home in the AM      Medical Consultants:   Liver transplant at [facility] (phone)    Subjective:   Patient initially said he was leaving AMA to go to [facility] but then changed his mind  Objective:    Vitals:   09/22/20 2042 09/23/20 0346 09/23/20 0838 09/23/20 1154  BP: (!) 151/94 (!) 148/99 (!) 175/104 (!) 140/91  Pulse: 74 65 68 68  Resp: 19 18 17 16   Temp: 97.7 F (36.5 C) 98.2 F (36.8 C) 98.1 F (36.7 C) 97.9 F (36.6 C)  TempSrc:  Oral Oral Oral  SpO2: 94% 94% 97% 94%  Weight:      Height:        Intake/Output Summary (Last 24 hours) at 09/23/2020 1214 Last data filed at 09/23/2020 1004 Gross per 24 hour  Intake 720 ml  Output 400 ml  Net 320 ml   Filed Weights   09/21/20 1851 09/22/20 0225  Weight: 120.2 kg 124.4 kg    Exam:  General: Appearance:    Obese male in no acute distress     Lungs:      respirations unlabored  Heart:    Normal heart rate. Normal rhythm. No murmurs, rubs, or gallops.   MS:   All extremities are intact.  Despite claims of pain, moves his right wrist w/o issues  Neurologic:   Awake, alert, oriented x 3. No apparent focal neurological           defect. Speech verbose and clear    Data Reviewed:   I have personally reviewed following labs and imaging studies:  Labs: Labs show the following:   Basic Metabolic Panel: Recent Labs  Lab 09/21/20 2039 09/22/20 0515 09/23/20 0426  NA 141 139 142  K 3.6 3.6 3.5  CL 106 107 108  CO2 26 24 25   GLUCOSE 129* 122* 129*  BUN 19 17 15   CREATININE 1.72* 1.56* 1.42*  CALCIUM 8.9 8.6* 9.0   GFR Estimated Creatinine Clearance: 74.5 mL/min (A) (by C-G formula based on SCr of 1.42 mg/dL (H)). Liver Function Tests: Recent Labs  Lab 09/21/20 2039 09/23/20 0426  AST 27 20  ALT 44 35  ALKPHOS 47 42  BILITOT 1.4* 1.2  PROT 6.3* 6.1*  ALBUMIN 3.8 3.6   No results for input(s): LIPASE, AMYLASE in the last 168 hours. No results for input(s): AMMONIA in the last 168 hours. Coagulation profile No results  for input(s): INR, PROTIME in the last 168 hours.  CBC: Recent Labs  Lab 09/21/20 2039 09/22/20 0515 09/23/20 0426  WBC 9.6 8.8 9.7  NEUTROABS 6.9  --   --   HGB 15.5 14.4 14.9  HCT 45.2 41.6 42.7  MCV 97.0 95.6 95.5  PLT 169 160 156   Cardiac Enzymes: No results for input(s): CKTOTAL, CKMB, CKMBINDEX, TROPONINI in the last 168 hours. BNP (last 3 results) No results for input(s): PROBNP in the last 8760 hours. CBG: No results for input(s): GLUCAP in the last 168 hours. D-Dimer: No results for input(s): DDIMER in the last 72 hours. Hgb A1c: No results for input(s):  HGBA1C in the last 72 hours. Lipid Profile: No results for input(s): CHOL, HDL, LDLCALC, TRIG, CHOLHDL, LDLDIRECT in the last 72 hours. Thyroid function studies: No results for input(s): TSH, T4TOTAL, T3FREE, THYROIDAB in the last 72 hours.  Invalid input(s): FREET3 Anemia work up: No results for input(s): VITAMINB12, FOLATE, FERRITIN, TIBC, IRON, RETICCTPCT in the last 72 hours. Sepsis Labs: Recent Labs  Lab 09/21/20 2039 09/22/20 0515 09/23/20 0426  WBC 9.6 8.8 9.7  LATICACIDVEN 1.4  --   --     Microbiology Recent Results (from the past 240 hour(s))  Blood culture (routine x 2)     Status: None (Preliminary result)   Collection Time: 09/21/20  8:39 PM   Specimen: BLOOD  Result Value Ref Range Status   Specimen Description BLOOD LEFT ANTECUBITAL  Final   Special Requests   Final    BOTTLES DRAWN AEROBIC AND ANAEROBIC Blood Culture results may not be optimal due to an excessive volume of blood received in culture bottles   Culture   Final    NO GROWTH 2 DAYS Performed at [facility], [address] [place], [place] [address]    Report Status PENDING  Incomplete  Resp Panel by RT-PCR (Flu A&B, Covid) Nasopharyngeal Swab     Status: None   Collection Time: 09/21/20  8:39 PM   Specimen: Nasopharyngeal Swab; Nasopharyngeal(NP) swabs in vial transport medium  Result Value Ref Range Status    SARS Coronavirus 2 by RT PCR NEGATIVE NEGATIVE Final    Comment: (NOTE) SARS-CoV-2 target nucleic acids are NOT DETECTED.  The SARS-CoV-2 RNA is generally detectable in upper respiratory specimens during the acute phase of infection. The lowest concentration of SARS-CoV-2 viral copies this assay can detect is 138 copies/mL. A negative result does not preclude SARS-Cov-2 infection and should not be used as the sole basis for treatment or other patient management decisions. A negative result may occur with  improper specimen collection/handling, submission of specimen other than nasopharyngeal swab, presence of viral mutation(s) within the areas targeted by this assay, and inadequate number of viral copies(<138 copies/mL). A negative result must be combined with clinical observations, patient history, and epidemiological information. The expected result is Negative.  Fact Sheet for Patients:  EntrepreneurPulse.com.au  Fact Sheet for Healthcare Providers:  IncredibleEmployment.be  This test is no t yet approved or cleared by the [place] FDA and  has been authorized for detection and/or diagnosis of SARS-CoV-2 by FDA under an Emergency Use Authorization (EUA). This EUA will remain  in effect (meaning this test can be used) for the duration of the COVID-19 declaration under Section 564(b)(1) of the Act, 21 U.S.C.section 360bbb-3(b)(1), unless the authorization is terminated  or revoked sooner.       Influenza A by PCR NEGATIVE NEGATIVE Final   Influenza B by PCR NEGATIVE NEGATIVE Final    Comment: (NOTE) The Xpert Xpress SARS-CoV-2/FLU/RSV plus assay is intended as an aid in the diagnosis of influenza from Nasopharyngeal swab specimens and should not be used as a sole basis for treatment. Nasal washings and aspirates are unacceptable for Xpert Xpress SARS-CoV-2/FLU/RSV testing.  Fact Sheet for  Patients: EntrepreneurPulse.com.au  Fact Sheet for Healthcare Providers: IncredibleEmployment.be  This test is not yet approved or cleared by the [place] FDA and has been authorized for detection and/or diagnosis of SARS-CoV-2 by FDA under an Emergency Use Authorization (EUA). This EUA will remain in effect (meaning this test can be used) for the duration of the COVID-19 declaration  under Section 564(b)(1) of the Act, 21 U.S.C. section 360bbb-3(b)(1), unless the authorization is terminated or revoked.  Performed at [facility], [address] [place], [place] [address]   Blood culture (routine x 2)     Status: None (Preliminary result)   Collection Time: 09/21/20  8:40 PM   Specimen: BLOOD  Result Value Ref Range Status   Specimen Description BLOOD BLOOD RIGHT FOREARM  Final   Special Requests   Final    BOTTLES DRAWN AEROBIC AND ANAEROBIC Blood Culture results may not be optimal due to an excessive volume of blood received in culture bottles   Culture   Final    NO GROWTH 2 DAYS Performed at [facility], [address] [place], [place] [address]    Report Status PENDING  Incomplete  Urine culture     Status: None   Collection Time: 09/21/20 11:46 PM   Specimen: Urine, Random  Result Value Ref Range Status   Specimen Description   Final    URINE, RANDOM Performed at [facility], [address] [place], [place] [address]    Special Requests   Final    NONE Performed at [facility], [address] [place], [place] [address]    Culture   Final    NO GROWTH Performed at [facility], [place] [address], [place], [place] [address]    Report Status 09/23/2020 FINAL  Final  Aerobic Culture w Gram Stain (superficial specimen)     Status: None (Preliminary result)   Collection Time: 09/22/20  1:35 AM   Specimen: Foot  Result Value Ref Range Status   Specimen Description FOOT RIGHT   Final   Special Requests NONE  Final   Gram Stain NO WBC SEEN NO ORGANISMS SEEN   Final   Culture   Final    NO GROWTH 1 DAY Performed at [facility], [address] [place], [place] [address]    Report Status PENDING  Incomplete    Procedures and diagnostic studies:  DG Chest 2 View  Result Date: 09/23/2020 CLINICAL DATA:  65 year old male with history of hypoxia. EXAM: CHEST - 2 VIEW COMPARISON:  Chest x-ray 09/21/2020. FINDINGS: Lung volumes are normal. No consolidative airspace disease. No pleural effusions. No pneumothorax. No pulmonary nodule or mass noted. Pulmonary vasculature and the cardiomediastinal silhouette are within normal limits. IMPRESSION: No radiographic evidence of acute cardiopulmonary disease. Electronically Signed   By: Vinnie Langton M.D.   On: 09/23/2020 09:30   CT Head Wo Contrast  Result Date: 09/21/2020 CLINICAL DATA:  Vomiting and diarrhea. EXAM: CT HEAD WITHOUT CONTRAST TECHNIQUE: Contiguous axial images were obtained from the base of the skull through the vertex without intravenous contrast. COMPARISON:  07/20/2011 FINDINGS: Brain: No evidence of acute infarction, hemorrhage, hydrocephalus, extra-axial collection or mass lesion/mass effect. Mild diffuse cerebral atrophy. Patchy low-attenuation changes in the deep white matter likely representing small vessel ischemic change. Vascular: Mild intracranial arterial vascular calcifications are present. Skull: The calvarium appears intact. Sinuses/Orbits: Small retention cyst in the sphenoid sinus. Paranasal sinuses and mastoid air cells are otherwise clear. Other: None. IMPRESSION: 1. No acute intracranial abnormalities. 2. Mild diffuse cerebral atrophy and small vessel ischemic changes. Electronically Signed   By: Lucienne Capers M.D.   On: 09/21/2020 22:29   DG Chest Portable 1 View  Result Date: 09/21/2020 CLINICAL DATA:  Fever. EXAM: PORTABLE CHEST 1 VIEW COMPARISON:  Chest radiograph 08/11/2019  FINDINGS: Lung volumes are low.The  cardiomediastinal contours are normal. Pulmonary vasculature is normal. No consolidation, pleural effusion, or pneumothorax. No acute osseous abnormalities are seen. IMPRESSION: Low lung volumes without acute abnormality. Electronically Signed   By: Keith Rake M.D.   On: 09/21/2020 20:08   CT Angio Chest/Abd/Pel for Dissection W and/or Wo Contrast  Result Date: 09/21/2020 CLINICAL DATA:  Abdominal pain with vomiting and diarrhea EXAM: CT ANGIOGRAPHY CHEST, ABDOMEN AND PELVIS TECHNIQUE: Non-contrast CT of the chest was initially obtained. Multidetector CT imaging through the chest, abdomen and pelvis was performed using the standard protocol during bolus administration of intravenous contrast. Multiplanar reconstructed images and MIPs were obtained and reviewed to evaluate the vascular anatomy. CONTRAST:  31mL OMNIPAQUE IOHEXOL 350 MG/ML SOLN COMPARISON:  None. FINDINGS: CTA CHEST FINDINGS Cardiovascular: --Heart: The heart size is normal.  There is nopericardial effusion. --Aorta: The course and caliber of the thoracic aorta are normal. There is mild aortic atherosclerotic calcification. Precontrast images show no aortic intramural hematoma. There is no blood pool, dissection or penetrating ulcer demonstrated on arterial phase postcontrast imaging. There is a conventional 3 vessel aortic arch branching pattern. The proximal arch vessels are widely patent. --Pulmonary Arteries: Contrast timing is optimized for preferential opacification of the aorta. Within that limitation, normal central pulmonary arteries. Mediastinum/Nodes: No mediastinal, hilar or axillary lymphadenopathy. The visualized thyroid and thoracic esophageal course are unremarkable. Lungs/Pleura: No pulmonary nodules or masses. No pleural effusion or pneumothorax. No focal airspace consolidation. No focal pleural abnormality. Musculoskeletal: No chest wall abnormality. No acute osseous findings. Review of the  MIP images confirms the above findings. CTA ABDOMEN AND PELVIS FINDINGS VASCULAR Aorta: Normal caliber aorta without aneurysm, dissection, vasculitis or hemodynamically significant stenosis. There is mild aortic atherosclerosis. Celiac: No aneurysm, dissection or hemodynamically significant stenosis. Normal branching pattern. SMA: Widely patent without dissection or stenosis. Renals: Single renal arteries bilaterally. No aneurysm, dissection, stenosis or evidence of fibromuscular dysplasia. IMA: Patent without abnormality. Inflow: No aneurysm, stenosis or dissection. Veins: Normal course and caliber of the major veins. Assessment is otherwise limited by the arterial dominant contrast phase. Review of the MIP images confirms the above findings. NON-VASCULAR Hepatobiliary: Status post liver transplant.  Gallbladder absent. Pancreas: Normal contours without ductal dilatation. No peripancreatic fluid collection. Spleen: Normal arterial phase splenic enhancement pattern. Adrenals/Urinary Tract: --Adrenal glands: Normal. --Right kidney/ureter: Mild cortical thinning. Simple renal cyst measures 2.4 cm. --Left kidney/ureter: Mild atrophy.  Simple cyst measures 2.6 cm. --Urinary bladder: Unremarkable. Stomach/Bowel: --Stomach/Duodenum: No hiatal hernia or other gastric abnormality. Normal duodenal course and caliber. --Small bowel: No dilatation or inflammation. --Colon: No focal abnormality. --Appendix: Normal. Lymphatic:  No abdominal or pelvic lymphadenopathy. Reproductive: Normal prostate and seminal vesicles. Musculoskeletal. No bony spinal canal stenosis or focal osseous abnormality. Other: None. Review of the MIP images confirms the above findings. IMPRESSION: 1. No acute aortic syndrome or other acute abnormality of the chest, abdomen or pelvis. Aortic Atherosclerosis (ICD10-I70.0). Electronically Signed   By: Ulyses Jarred M.D.   On: 09/21/2020 22:34    Medications:   . amLODipine  10 mg Oral Daily  .  diclofenac Sodium  2 g Topical QID  . enoxaparin (LOVENOX) injection  0.5 mg/kg Subcutaneous Q24H  . mycophenolate  500 mg Oral q AM   And  . mycophenolate  250 mg Oral QPM  . pantoprazole  40 mg Oral Daily  . predniSONE  40 mg Oral Q breakfast  . tacrolimus  1 mg Oral q morning   And  . tacrolimus  0.5  mg Oral QHS  . tamsulosin  0.4 mg Oral Daily   Continuous Infusions:   LOS: 1 day   Geradine Girt  [facility]   How to contact the [facility] Attending or Consulting provider [place] or covering provider during after hours [place], for this patient?  1. Check the care team in [facility] and look for a) attending/consulting [facility] provider listed and b) the [facility] team listed 2. Log into [email or web address] and use [place]'s universal password to access. If you do not have the password, please contact the hospital operator. 3. Locate the [facility] provider you are looking for under [facility] and page to a number that you can be directly reached. 4. If you still have difficulty reaching the provider, please page the [facility] (Director on Call) for the Hospitalists listed on amion for assistance.  09/23/2020, 12:14 PM

## 2020-09-24 ENCOUNTER — Inpatient Hospital Stay: Payer: Medicare Other

## 2020-09-24 DIAGNOSIS — R1110 Vomiting, unspecified: Secondary | ICD-10-CM | POA: Diagnosis not present

## 2020-09-24 DIAGNOSIS — Z711 Person with feared health complaint in whom no diagnosis is made: Secondary | ICD-10-CM

## 2020-09-24 DIAGNOSIS — R197 Diarrhea, unspecified: Secondary | ICD-10-CM | POA: Diagnosis not present

## 2020-09-24 LAB — CBC
HCT: 43.6 % (ref 39.0–52.0)
Hemoglobin: 15 g/dL (ref 13.0–17.0)
MCH: 32.9 pg (ref 26.0–34.0)
MCHC: 34.4 g/dL (ref 30.0–36.0)
MCV: 95.6 fL (ref 80.0–100.0)
Platelets: 170 10*3/uL (ref 150–400)
RBC: 4.56 MIL/uL (ref 4.22–5.81)
RDW: 13.4 % (ref 11.5–15.5)
WBC: 9.8 10*3/uL (ref 4.0–10.5)
nRBC: 0 % (ref 0.0–0.2)

## 2020-09-24 LAB — BASIC METABOLIC PANEL
Anion gap: 7 (ref 5–15)
BUN: 19 mg/dL (ref 8–23)
CO2: 26 mmol/L (ref 22–32)
Calcium: 9 mg/dL (ref 8.9–10.3)
Chloride: 107 mmol/L (ref 98–111)
Creatinine, Ser: 1.46 mg/dL — ABNORMAL HIGH (ref 0.61–1.24)
GFR, Estimated: 54 mL/min — ABNORMAL LOW (ref >60)
Glucose, Bld: 119 mg/dL — ABNORMAL HIGH (ref 70–99)
Potassium: 3.8 mmol/L (ref 3.5–5.1)
Sodium: 140 mmol/L (ref 135–145)

## 2020-09-24 LAB — AEROBIC CULTURE W GRAM STAIN (SUPERFICIAL SPECIMEN)
Culture: NO GROWTH
Gram Stain: NONE SEEN

## 2020-09-24 MED ORDER — PREDNISONE 20 MG PO TABS: ORAL_TABLET | ORAL | 0 refills | Status: DC

## 2020-09-24 MED ORDER — AMLODIPINE BESYLATE 10 MG PO TABS: 10.0000 mg | ORAL_TABLET | Freq: Every day | ORAL | 0 refills | Status: DC

## 2020-09-24 NOTE — Discharge Summary (Signed)
Physician Discharge Summary  KIJUAN GALLICCHIO FBP:[id number] DOB: [birth year] DOA: 09/21/2020  PCP: Venia Carbon, MD  Admit date: 09/21/2020 Discharge date: 09/24/2020  Admitted From: home Discharge disposition: home   Recommendations for Outpatient Follow-Up:   Pulse prednisone and then back to normal dose Needs BP monitored Low salt diet  Discharge Diagnosis:   Principal Problem:   Vomiting and diarrhea Active Problems:   GOUT   Long-term use of immunosuppressant medication   Stage 3b chronic kidney disease ([place])   Liver transplant status ([place])   Hypertensive urgency   Joint pain   Worried well    Discharge Condition: Improved.  Diet recommendation: Low sodium, heart healthy.  Wound care: None.  Code status: Full.   History of Present Illness:   Devin White is a 65 y.o. male with medical history significant for liver transplant in 2009 secondary to NAFLD on chronic immune suppressive therapy, gout, BPH, GERD, HTN who presents to the emergency room with a 1 day history of nausea vomiting and diarrhea for oral intake.  Vomiting is nonbloody nonbilious and consists of clear gastric contents.  Diarrhea is runny, with no blood and not dark.  He has occasional right-sided abdominal cramping.  Denies sick contacts and denies ingestion of food out of the ordinary.  Additionally he complains of gout pain in his right wrist for the past month but which has recently improved and also of a swelling behind his right ankle.  He denies dysuria   Hospital Course by Problem:   Vomiting and diarrhea -resolved, patient eating well  Dysuria -U/A negative x 2- resolved  Hypertensive urgency -norvasc started -will need outpatient follow up  Elevated troponin -Troponin mildly elevated at 24, likely demand ischemia from related to pain, elevated blood pressure -EKG nonacute  GOUTflare -Uric acid 10.8 -We will avoid NSAIDs due to kidney  disease -prednisone x 5 days -No fluid analysis from aspiration done in the ER --- just culture- NGTD  Stage 3b chronic kidney disease (HCC) -Creatinine stable  Liver transplant status ([place]) Long-term use of immunosuppressant medication -Continue CellCept and Prograf  obesity Body mass index is 36.18 kg/m.    Medical Consultants:    [facility] transplant team  Discharge Exam:    09/24/20 1150  BP: (!) 143/80  Pulse: 73  Resp: 15  Temp: 97.7 F (36.5 C)  SpO2: 96%   Vitals:    General exam: Appears calm and comfortable.   The results of significant diagnostics from this hospitalization (including imaging, microbiology, ancillary and laboratory) are listed below for reference.     Procedures and Diagnostic Studies:   CT Head Wo Contrast  Result Date: 09/21/2020 CLINICAL DATA:  Vomiting and diarrhea. EXAM: CT HEAD WITHOUT CONTRAST TECHNIQUE: Contiguous axial images were obtained from the base of the skull through the vertex without intravenous contrast. COMPARISON:  07/20/2011 FINDINGS: Brain: No evidence of acute infarction, hemorrhage, hydrocephalus, extra-axial collection or mass lesion/mass effect. Mild diffuse cerebral atrophy. Patchy low-attenuation changes in the deep white matter likely representing small vessel ischemic change. Vascular: Mild intracranial arterial vascular calcifications are present. Skull: The calvarium appears intact. Sinuses/Orbits: Small retention cyst in the sphenoid sinus. Paranasal sinuses and mastoid air cells are otherwise clear. Other: None. IMPRESSION: 1. No acute intracranial abnormalities. 2. Mild diffuse cerebral atrophy and small vessel ischemic changes. Electronically Signed   By: Lucienne Capers M.D.   On: 09/21/2020 22:29   DG Chest Portable 1 View  Result Date: 09/21/2020  CLINICAL DATA:  Fever. EXAM: PORTABLE CHEST 1 VIEW COMPARISON:  Chest radiograph 08/11/2019 FINDINGS: Lung volumes are low.The cardiomediastinal contours  are normal. Pulmonary vasculature is normal. No consolidation, pleural effusion, or pneumothorax. No acute osseous abnormalities are seen. IMPRESSION: Low lung volumes without acute abnormality. Electronically Signed   By: Keith Rake M.D.   On: 09/21/2020 20:08   CT Angio Chest/Abd/Pel for Dissection W and/or Wo Contrast  Result Date: 09/21/2020 CLINICAL DATA:  Abdominal pain with vomiting and diarrhea EXAM: CT ANGIOGRAPHY CHEST, ABDOMEN AND PELVIS TECHNIQUE: Non-contrast CT of the chest was initially obtained. Multidetector CT imaging through the chest, abdomen and pelvis was performed using the standard protocol during bolus administration of intravenous contrast. Multiplanar reconstructed images and MIPs were obtained and reviewed to evaluate the vascular anatomy. CONTRAST:  47mL OMNIPAQUE IOHEXOL 350 MG/ML SOLN COMPARISON:  None. FINDINGS: CTA CHEST FINDINGS Cardiovascular: --Heart: The heart size is normal.  There is nopericardial effusion. --Aorta: The course and caliber of the thoracic aorta are normal. There is mild aortic atherosclerotic calcification. Precontrast images show no aortic intramural hematoma. There is no blood pool, dissection or penetrating ulcer demonstrated on arterial phase postcontrast imaging. There is a conventional 3 vessel aortic arch branching pattern. The proximal arch vessels are widely patent. --Pulmonary Arteries: Contrast timing is optimized for preferential opacification of the aorta. Within that limitation, normal central pulmonary arteries. Mediastinum/Nodes: No mediastinal, hilar or axillary lymphadenopathy. The visualized thyroid and thoracic esophageal course are unremarkable. Lungs/Pleura: No pulmonary nodules or masses. No pleural effusion or pneumothorax. No focal airspace consolidation. No focal pleural abnormality. Musculoskeletal: No chest wall abnormality. No acute osseous findings. Review of the MIP images confirms the above findings. CTA ABDOMEN AND  PELVIS FINDINGS VASCULAR Aorta: Normal caliber aorta without aneurysm, dissection, vasculitis or hemodynamically significant stenosis. There is mild aortic atherosclerosis. Celiac: No aneurysm, dissection or hemodynamically significant stenosis. Normal branching pattern. SMA: Widely patent without dissection or stenosis. Renals: Single renal arteries bilaterally. No aneurysm, dissection, stenosis or evidence of fibromuscular dysplasia. IMA: Patent without abnormality. Inflow: No aneurysm, stenosis or dissection. Veins: Normal course and caliber of the major veins. Assessment is otherwise limited by the arterial dominant contrast phase. Review of the MIP images confirms the above findings. NON-VASCULAR Hepatobiliary: Status post liver transplant.  Gallbladder absent. Pancreas: Normal contours without ductal dilatation. No peripancreatic fluid collection. Spleen: Normal arterial phase splenic enhancement pattern. Adrenals/Urinary Tract: --Adrenal glands: Normal. --Right kidney/ureter: Mild cortical thinning. Simple renal cyst measures 2.4 cm. --Left kidney/ureter: Mild atrophy.  Simple cyst measures 2.6 cm. --Urinary bladder: Unremarkable. Stomach/Bowel: --Stomach/Duodenum: No hiatal hernia or other gastric abnormality. Normal duodenal course and caliber. --Small bowel: No dilatation or inflammation. --Colon: No focal abnormality. --Appendix: Normal. Lymphatic:  No abdominal or pelvic lymphadenopathy. Reproductive: Normal prostate and seminal vesicles. Musculoskeletal. No bony spinal canal stenosis or focal osseous abnormality. Other: None. Review of the MIP images confirms the above findings. IMPRESSION: 1. No acute aortic syndrome or other acute abnormality of the chest, abdomen or pelvis. Aortic Atherosclerosis (ICD10-I70.0). Electronically Signed   By: Ulyses Jarred M.D.   On: 09/21/2020 22:34     Labs:   Basic Metabolic Panel: Recent Labs  Lab 09/21/20 2039 09/22/20 0515 09/23/20 0426 09/24/20 0536   NA 141 139 142 140  K 3.6 3.6 3.5 3.8  CL 106 107 108 107  CO2 26 24 25 26   GLUCOSE 129* 122* 129* 119*  BUN 19 17 15 19   CREATININE 1.72* 1.56* 1.42* 1.46*  CALCIUM  8.9 8.6* 9.0 9.0   GFR Estimated Creatinine Clearance: 72.5 mL/min (A) (by C-G formula based on SCr of 1.46 mg/dL (H)). Liver Function Tests: Recent Labs  Lab 09/21/20 2039 09/23/20 0426  AST 27 20  ALT 44 35  ALKPHOS 47 42  BILITOT 1.4* 1.2  PROT 6.3* 6.1*  ALBUMIN 3.8 3.6   No results for input(s): LIPASE, AMYLASE in the last 168 hours. No results for input(s): AMMONIA in the last 168 hours. Coagulation profile No results for input(s): INR, PROTIME in the last 168 hours.  CBC: Recent Labs  Lab 09/21/20 2039 09/22/20 0515 09/23/20 0426 09/24/20 0536  WBC 9.6 8.8 9.7 9.8  NEUTROABS 6.9  --   --   --   HGB 15.5 14.4 14.9 15.0  HCT 45.2 41.6 42.7 43.6  MCV 97.0 95.6 95.5 95.6  PLT 169 160 156 170   Cardiac Enzymes: No results for input(s): CKTOTAL, CKMB, CKMBINDEX, TROPONINI in the last 168 hours. BNP: Invalid input(s): POCBNP CBG: No results for input(s): GLUCAP in the last 168 hours. D-Dimer No results for input(s): DDIMER in the last 72 hours. Hgb A1c No results for input(s): HGBA1C in the last 72 hours. Lipid Profile No results for input(s): CHOL, HDL, LDLCALC, TRIG, CHOLHDL, LDLDIRECT in the last 72 hours. Thyroid function studies No results for input(s): TSH, T4TOTAL, T3FREE, THYROIDAB in the last 72 hours.  Invalid input(s): FREET3 Anemia work up No results for input(s): VITAMINB12, FOLATE, FERRITIN, TIBC, IRON, RETICCTPCT in the last 72 hours. Microbiology Recent Results (from the past 240 hour(s))  Blood culture (routine x 2)     Status: None (Preliminary result)   Collection Time: 09/21/20  8:39 PM   Specimen: BLOOD  Result Value Ref Range Status   Specimen Description BLOOD LEFT ANTECUBITAL  Final   Special Requests   Final    BOTTLES DRAWN AEROBIC AND ANAEROBIC Blood Culture  results may not be optimal due to an excessive volume of blood received in culture bottles   Culture   Final    NO GROWTH 4 DAYS Performed at [facility], [address] [place], [place] [address]    Report Status PENDING  Incomplete  Resp Panel by RT-PCR (Flu A&B, Covid) Nasopharyngeal Swab     Status: None   Collection Time: 09/21/20  8:39 PM   Specimen: Nasopharyngeal Swab; Nasopharyngeal(NP) swabs in vial transport medium  Result Value Ref Range Status   SARS Coronavirus 2 by RT PCR NEGATIVE NEGATIVE Final    Comment: (NOTE) SARS-CoV-2 target nucleic acids are NOT DETECTED.  The SARS-CoV-2 RNA is generally detectable in upper respiratory specimens during the acute phase of infection. The lowest concentration of SARS-CoV-2 viral copies this assay can detect is 138 copies/mL. A negative result does not preclude SARS-Cov-2 infection and should not be used as the sole basis for treatment or other patient management decisions. A negative result may occur with  improper specimen collection/handling, submission of specimen other than nasopharyngeal swab, presence of viral mutation(s) within the areas targeted by this assay, and inadequate number of viral copies(<138 copies/mL). A negative result must be combined with clinical observations, patient history, and epidemiological information. The expected result is Negative.  Fact Sheet for Patients:  EntrepreneurPulse.com.au  Fact Sheet for Healthcare Providers:  IncredibleEmployment.be  This test is no t yet approved or cleared by the [place] FDA and  has been authorized for detection and/or diagnosis of SARS-CoV-2 by FDA under an Emergency Use Authorization (EUA). This EUA  will remain  in effect (meaning this test can be used) for the duration of the COVID-19 declaration under Section 564(b)(1) of the Act, 21 U.S.C.section 360bbb-3(b)(1), unless the authorization is terminated   or revoked sooner.       Influenza A by PCR NEGATIVE NEGATIVE Final   Influenza B by PCR NEGATIVE NEGATIVE Final    Comment: (NOTE) The Xpert Xpress SARS-CoV-2/FLU/RSV plus assay is intended as an aid in the diagnosis of influenza from Nasopharyngeal swab specimens and should not be used as a sole basis for treatment. Nasal washings and aspirates are unacceptable for Xpert Xpress SARS-CoV-2/FLU/RSV testing.  Fact Sheet for Patients: EntrepreneurPulse.com.au  Fact Sheet for Healthcare Providers: IncredibleEmployment.be  This test is not yet approved or cleared by the [place] FDA and has been authorized for detection and/or diagnosis of SARS-CoV-2 by FDA under an Emergency Use Authorization (EUA). This EUA will remain in effect (meaning this test can be used) for the duration of the COVID-19 declaration under Section 564(b)(1) of the Act, 21 U.S.C. section 360bbb-3(b)(1), unless the authorization is terminated or revoked.  Performed at [facility], [address] [place], [place] [address]   Blood culture (routine x 2)     Status: None (Preliminary result)   Collection Time: 09/21/20  8:40 PM   Specimen: BLOOD  Result Value Ref Range Status   Specimen Description BLOOD BLOOD RIGHT FOREARM  Final   Special Requests   Final    BOTTLES DRAWN AEROBIC AND ANAEROBIC Blood Culture results may not be optimal due to an excessive volume of blood received in culture bottles   Culture   Final    NO GROWTH 4 DAYS Performed at [facility], [address] [place], [place] [address]    Report Status PENDING  Incomplete  Urine culture     Status: None   Collection Time: 09/21/20 11:46 PM   Specimen: Urine, Random  Result Value Ref Range Status   Specimen Description   Final    URINE, RANDOM Performed at [facility], [address], [place], [place] [address]    Special Requests   Final    NONE Performed at  [facility], [address], [place], [place] [address]    Culture   Final    NO GROWTH Performed at [facility], [place] [address] [place], [place] [address]    Report Status 09/23/2020 FINAL  Final  Aerobic Culture w Gram Stain (superficial specimen)     Status: None   Collection Time: 09/22/20  1:35 AM   Specimen: Foot  Result Value Ref Range Status   Specimen Description FOOT RIGHT  Final   Special Requests NONE  Final   Gram Stain NO WBC SEEN NO ORGANISMS SEEN   Final   Culture   Final    NO GROWTH 2 DAYS Performed at [facility], [address] [place], [place] [address]    Report Status 09/24/2020 FINAL  Final     Discharge Instructions:   Discharge Instructions    Diet - low sodium heart healthy   Complete by: As directed    Discharge instructions   Complete by: As directed    Wear right wrist splint for comfort Follow up with PCP for BP check Follow up at [facility] as well   Increase activity slowly   Complete by: As directed      Allergies as of 09/24/2020   No Known Allergies  Medication List    STOP taking these medications   predniSONE 5 MG tablet Commonly known as: DELTASONE     TAKE these medications   amLODipine 10 MG tablet Commonly known as: NORVASC Take 1 tablet (10 mg total) by mouth daily.   calcium carbonate 500 MG chewable tablet Commonly known as: Tums Chew 1 tablet (200 mg of elemental calcium total) by mouth 2 (two) times daily as needed for indigestion or heartburn.   colchicine 0.6 MG tablet Take 1 tablet (0.6 mg total) by mouth 2 (two) times daily as needed.   mycophenolate 250 MG capsule Commonly known as: CELLCEPT Take 250-500 mg by mouth 2 (two) times daily. 2 capsules (500 mg) in the morning and one capsule (250 mg) in the evening   omeprazole 20 MG capsule Commonly known as: PRILOSEC TAKE 1 CAPSULE BY MOUTH TWICE DAILY   tacrolimus 0.5 MG capsule Commonly known as: PROGRAF Take 0.5-1 mg by  mouth See admin instructions. Take 2 capsules (1mg ) by mouth every morning and 1 capsule (0.5mg ) by mouth every night   tamsulosin 0.4 MG Caps capsule Commonly known as: FLOMAX Take 1 capsule (0.4 mg total) by mouth daily.       Follow-up Information    Venia Carbon, MD In 1 week.   Specialties: Internal Medicine, Pediatrics Why: patient to make own appointment; office closed Contact information: [place] [id number] [phone]                Time coordinating discharge: 35 min  Signed:  Geradine Girt DO  [facility] 09/25/2020, 3:16 PM

## 2020-09-24 NOTE — Evaluation (Signed)
Physical Therapy Evaluation Patient Details Name: Devin White MRN: [id number] DOB: 1955-06-21 Today's Date: 09/24/2020   History of Present Illness  65 y/o male presents to the ED with nausea, vomiting and diarrhea, also c/o R wrist pain for the last month and swelling behind his R ankle. Medical history includes: liver transplant in 2009 2/2 NAFLD on chronic immune suppressive therapy, gout, BPH, GERD, HTN.  Clinical Impression  Pt received seated in bed with HOB elevated and agreeable to therapy this morning. Pt demonstrated mod I with bed mobility using railings with HOB elevated. Pt completed sit<>stand transfer with CGA VCs for pacing and sequencing provided to patient. Pt with increased BP measured at: 179/113, MD aware and medicate was provided to pt by RN while PT was in the room. Pt ambulated 2106f with minA from therapist and coasting on railings along the hallway, multiple VCs for pursed lip breathing and standing rest breaks due to fatigue. Pt negotiated 5 steps with a step to pattern with minA from therapist and moderate reliance on the railing for stability and 2/2 LE weakness. Pt with increased weakness following ambulation and stair negotiation. Overall pt tolerated interventions well today with some fatigue. Pt left seated in recliner with breakfast, call bell in reach and all needs met.    Follow Up Recommendations No PT follow up    Equipment Recommendations       Recommendations for Other Services       Precautions / Restrictions Precautions Precautions: None Restrictions Weight Bearing Restrictions: No      Mobility  Bed Mobility Overal bed mobility: Modified Independent             General bed mobility comments: Pt able to complete bed mobility with HOB elevated, increased time and rails    Transfers Overall transfer level: Needs assistance Equipment used: Standard walker Transfers: Sit to/from Stand Sit to Stand: Min guard         General  transfer comment: Pt able to complete safe sit<>stand transfer with CGA  Ambulation/Gait Ambulation/Gait assistance: Min assist Gait Distance (Feet): 150 Feet Assistive device: Standard walker Gait Pattern/deviations: Shuffle Gait velocity: Decreased   General Gait Details: Pt requiring multiple standing rest breaks with PLB due to fatigue during ambulation  Stairs            Wheelchair Mobility    Modified Rankin (Stroke Patients Only)       Balance Overall balance assessment: Needs assistance Sitting-balance support: Feet supported Sitting balance-Leahy Scale: Normal Sitting balance - Comments: Pt demonstrated safe balance seated EOB with BIL feet supported   Standing balance support: Bilateral upper extremity supported Standing balance-Leahy Scale: Good Standing balance comment: Pt demonstrated good standing stability without any LOB                             Pertinent Vitals/Pain Pain Assessment: 0-10 Pain Score: 7  Pain Location: Pt's R wrist and R heel - he stated that the wrist feels hot and "crackles" when he moves it. Pain Descriptors / Indicators: Aching;Dull Pain Intervention(s): Monitored during session    Home Living Family/patient expects to be discharged to:: Private residence Living Arrangements: Children Available Help at Discharge: Family (Pt's granddaughter and great grandson (3.5 yo) live with him, his granddaughter is able to assist intermittently with needs) Type of Home: House Home Access: Stairs to enter Entrance Stairs-Rails: Left Entrance Stairs-Number of Steps: 5 - railing on the L side  with wall on the other Home Layout: One level Home Equipment: None      Prior Function Level of Independence: Independent               Hand Dominance        Extremity/Trunk Assessment        Lower Extremity Assessment Lower Extremity Assessment: Generalized weakness       Communication   Communication: No  difficulties  Cognition Arousal/Alertness: Awake/alert Behavior During Therapy: WFL for tasks assessed/performed Overall Cognitive Status: Within Functional Limits for tasks assessed                                 General Comments: Pt alert and oriented x4      General Comments      Exercises Other Exercises Other Exercises: Educated pt on pursed lip breathing to maintain fatigue levels and reduce stress when ambulating   Assessment/Plan    PT Assessment Patient needs continued PT services  PT Problem List Decreased strength;Decreased mobility;Decreased safety awareness;Decreased coordination;Decreased activity tolerance;Decreased balance;Pain       PT Treatment Interventions Gait training;Stair training;Functional mobility training;Therapeutic activities;Balance training;Therapeutic exercise;Patient/family education    PT Goals (Current goals can be found in the Care Plan section)  Acute Rehab PT Goals Patient Stated Goal: Reduce Rwrist and heel pain PT Goal Formulation: With patient Time For Goal Achievement: 10/08/20 Potential to Achieve Goals: Good    Frequency Min 2X/week   Barriers to discharge        Co-evaluation               AM-PAC PT "6 Clicks" Mobility  Outcome Measure Help needed turning from your back to your side while in a flat bed without using bedrails?: None Help needed moving from lying on your back to sitting on the side of a flat bed without using bedrails?: None Help needed moving to and from a bed to a chair (including a wheelchair)?: None Help needed standing up from a chair using your arms (e.g., wheelchair or bedside chair)?: A Little Help needed to walk in hospital room?: A Little Help needed climbing 3-5 steps with a railing? : A Lot 6 Click Score: 20    End of Session Equipment Utilized During Treatment: Gait belt Activity Tolerance: Patient tolerated treatment well;Patient limited by fatigue Patient left: in  chair;with call bell/phone within reach Nurse Communication: Mobility status PT Visit Diagnosis: Unsteadiness on feet (R26.81);Difficulty in walking, not elsewhere classified (R26.2);Muscle weakness (generalized) (M62.81)    Time: 0847-0936 PT Time Calculation (min) (ACUTE ONLY): 49 min   Charges:   PT Evaluation $PT Eval Low Complexity: 1 Low PT Treatments $Gait Training: 23-37 mins $ PT Supplies: 1 Supply         , PT, DPT 09/24/20, 1:17 PM    H  09/24/2020, 1:08 PM   

## 2020-09-25 ENCOUNTER — Encounter: Payer: Self-pay | Admitting: Internal Medicine

## 2020-09-25 ENCOUNTER — Ambulatory Visit (INDEPENDENT_AMBULATORY_CARE_PROVIDER_SITE_OTHER): Payer: Medicare Other | Admitting: Internal Medicine

## 2020-09-25 VITALS — BP 140/84 | HR 98 | Temp 97.0°F | Ht 70.5 in | Wt 270.0 lb

## 2020-09-25 DIAGNOSIS — M1000 Idiopathic gout, unspecified site: Secondary | ICD-10-CM | POA: Diagnosis not present

## 2020-09-25 DIAGNOSIS — I10 Essential (primary) hypertension: Secondary | ICD-10-CM

## 2020-09-25 DIAGNOSIS — K529 Noninfective gastroenteritis and colitis, unspecified: Secondary | ICD-10-CM | POA: Insufficient documentation

## 2020-09-25 DIAGNOSIS — M67879 Other specified disorders of synovium and tendon, unspecified ankle and foot: Secondary | ICD-10-CM | POA: Diagnosis not present

## 2020-09-25 NOTE — Assessment & Plan Note (Signed)
Had vomiting and diarrhea----but then it resolved There has been community GI illness---so he may have had this No action for now

## 2020-09-25 NOTE — Assessment & Plan Note (Signed)
Likely tophaceous mass on Achilles Wrist pain could be gout but the colchicine didn't seem to help Better now on the increased prednisone---will have him cut back to the usual 5mg 

## 2020-09-25 NOTE — Patient Instructions (Signed)
Please go back to the prednisone 5mg  after today's doses. Let me know if your symptoms get worse again

## 2020-09-25 NOTE — Assessment & Plan Note (Signed)
Not infected ?tophaceous without acute inflammation? Better now

## 2020-09-25 NOTE — Progress Notes (Signed)
Subjective:    Patient ID: Devin White, male    DOB: April 09, 1956, 65 y.o.   MRN: [id number]  HPI Here for hospital follow up This visit occurred during the SARS-CoV-2 public health emergency.  Safety protocols were in place, including screening questions prior to the visit, additional usage of staff PPE, and extensive cleaning of exam room while observing appropriate contact time as indicated for disinfecting solutions.   Had big knot on the back of his right heel It was not tender--but was afraid it was gout Colchicine did not help it Even before the colchicine--got nausea and some loose stools (4/7) Recurrent diarrhea after that Felt worse Family felt he didn't look good---so to ER  Swelling on right Achilles got bigger Also with pain in right wrist ER evaluation showed sig BP elevations Uric acid was very high--but I don't see fluid results other than culture (was negative)  Prednisone was increased to 40mg  Wrist is still achy---despite no trauma Right foot still draining and has pain (ER doctor did do I&D and he states it had pus coming out) Did have nausea but vomiting and diarrhea stopped in hospital  BP had stayed high--so put on amlodipine in the hospital  Current Outpatient Medications on File Prior to Visit  Medication Sig Dispense Refill  . amLODipine (NORVASC) 10 MG tablet Take 1 tablet (10 mg total) by mouth daily. 30 tablet 0  . calcium carbonate (TUMS) 500 MG chewable tablet Chew 1 tablet (200 mg of elemental calcium total) by mouth 2 (two) times daily as needed for indigestion or heartburn. 180 tablet 1  . colchicine 0.6 MG tablet Take 1 tablet (0.6 mg total) by mouth 2 (two) times daily as needed. 30 tablet 2  . mycophenolate (CELLCEPT) 250 MG capsule Take 250-500 mg by mouth 2 (two) times daily. 2 capsules (500 mg) in the morning and one capsule (250 mg) in the evening    . omeprazole (PRILOSEC) 20 MG capsule TAKE 1 CAPSULE BY MOUTH TWICE DAILY 180 capsule 3   . predniSONE (DELTASONE) 20 MG tablet 40 mg daily through 4/12 then go back to your long term dose 4 tablet 0  . tacrolimus (PROGRAF) 0.5 MG capsule Take 0.5-1 mg by mouth See admin instructions. Take 2 capsules (1mg ) by mouth every morning and 1 capsule (0.5mg ) by mouth every night    . tamsulosin (FLOMAX) 0.4 MG CAPS capsule Take 1 capsule (0.4 mg total) by mouth daily. 90 capsule 1   No current facility-administered medications on file prior to visit.    No Known Allergies  Past Medical History:  Diagnosis Date  . Arthritis    back and legs  . Benign prostatic hypertrophy   . GERD (gastroesophageal reflux disease)   . Gout   . History of blood transfusion    pt has antibodies in his blood since previous transfusions  . History of cirrhosis of liver S/P TRANSPLANT 2009  . History of liver failure S/P TRANSPLANT  . Hypertension   . Left ureteral calculus     Past Surgical History:  Procedure Laterality Date  . bone morrow biopsy    . CHOLECYSTECTOMY  2007  . CYSTO/ LEFT RETROGRADE PYELOGRAM/ LEFT URETERAL STENT PLACEMENT  03-05-2012  DR Tresa Moore [facility])   LEFT URETERAL CALCULI  . CYSTOSCOPY W/ URETERAL STENT PLACEMENT  03/11/2012   Procedure: CYSTOSCOPY WITH STENT REPLACEMENT;  Surgeon: Alexis Frock, MD;  Location: [facility];  Service: Urology;  Laterality: Left;  . HERNIA  REPAIR  2008  . LIVER BIOPSY    . LIVER TRANSPLANT  11/24/2007   pt states doing well since liver transplant  . LUMBAR DISC SURGERY    . LUMBAR FUSION    . removal of fistula    . URETEROSCOPY  03/11/2012   Procedure: URETEROSCOPY;  Surgeon: Alexis Frock, MD;  Location: [facility];  Service: Urology;  Laterality: Left;   STONE MANIPULATION, stone obtained  [place] [facility]    Family History  Problem Relation Age of Onset  . Cancer Mother        colon  . Arthritis Mother   . Vasculitis Mother   . Hypertension Mother   . Cancer Father        colon  . Kidney  disease Father   . Arthritis Father   . Arthritis Brother   . Alcohol abuse Maternal Aunt   . Diabetes Maternal Aunt   . Alcohol abuse Maternal Uncle   . Diabetes Paternal Aunt   . Alcohol abuse Maternal Uncle   . Alcohol abuse Maternal Uncle     Social History   Socioeconomic History  . Marital status: Single    Spouse name: Not on file  . Number of children: 3  . Years of education: Not on file  . Highest education level: Not on file  Occupational History  . Occupation: disabled    Employer: RETIRED  Tobacco Use  . Smoking status: Never Smoker  . Smokeless tobacco: Never Used  Substance and Sexual Activity  . Alcohol use: No  . Drug use: No  . Sexual activity: Not on file  Other Topics Concern  . Not on file  Social History Narrative   ** Merged History Encounter **       Has living will Requests daughter [name] as his health care POA Would accept brief attempt at resuscitation but no prolonged ventilation No tube feeds if cognitively unaware   Social Determinants of Health   Financial Resource Strain: Not on file  Food Insecurity: Not on file  Transportation Needs: Not on file  Physical Activity: Not on file  Stress: Not on file  Social Connections: Not on file  Intimate Partner Violence: Not on file   Review of Systems Temp increased -- 99.1 (up for him) Some night sweats Eating okay now Did have one episode of dysuria in hospital--urinalysis benign though     Objective:   Physical Exam Constitutional:      Appearance: Normal appearance.  Cardiovascular:     Rate and Rhythm: Normal rate and regular rhythm.     Heart sounds: No murmur heard. No gallop.   Pulmonary:     Effort: Pulmonary effort is normal.     Breath sounds: Normal breath sounds. No wheezing or rales.  Abdominal:     Palpations: Abdomen is soft.     Tenderness: There is no abdominal tenderness.  Musculoskeletal:     Cervical back: Neck supple.     Comments: Non tender nodule  on mid right Achilles--no drainage or redness now Slight tenderness at right wrist--but no clear synovitis  Lymphadenopathy:     Cervical: No cervical adenopathy.  Neurological:     Mental Status: He is alert.            Assessment & Plan:

## 2020-09-25 NOTE — Assessment & Plan Note (Signed)
BP Readings from Last 3 Encounters:  09/25/20 140/84  09/24/20 (!) 179/113  06/28/20 (!) 153/96   Now okay on the amlodipine He will continue this now Should review with his transplant team

## 2020-09-26 ENCOUNTER — Telehealth: Payer: Self-pay | Admitting: Internal Medicine

## 2020-09-26 LAB — CULTURE, BLOOD (ROUTINE X 2)
Culture: NO GROWTH
Culture: NO GROWTH

## 2020-09-26 NOTE — Telephone Encounter (Signed)
Devin White called in wanted to know about what to do due to he was given blood pressure medication in the hospital, and when he was out running errands he felt lightheaded, sweating, and jittery and he took his BP and it was 117/59 and then he sat down and waited 5 mins and he took it again and it was 111/59. Wanted to know if he should still continue taking the medication.   Please advise

## 2020-09-26 NOTE — Telephone Encounter (Signed)
Unable to reach pt by phone see highlighted area of access nurse note. Sending note to Dr Silvio Pate who is out of office, [facility] CMA and DR Damita Dunnings.

## 2020-09-26 NOTE — Telephone Encounter (Signed)
[place] [facility] - Client TELEPHONE ADVICE RECORD [facility] Patient Name: Devin White Gender: Male DOB: 1956/04/26 Age: 65 Y 35 M 26 D Return Phone Number: [phone] (Primary) Address: City/ [place]/ [place] [address] Client [facility] - Client Client Site [place] - Day Physician Viviana Simpler- MD Contact Type Call Who Is Calling Patient / Member / Family / Caregiver Call Type Triage / Clinical Relationship To Patient Self Return Phone Number [phone] (Primary) Chief Complaint BLOOD PRESSURE LOW - Systolic (top number) 90 or less Reason for Call Symptomatic / Request for Health Information Initial Comment Pt was in the hospital last week for high BP. Today he felt jittery, sweating and light headed. 117/59 111/55 [place], [place] Translation No Nurse Assessment Nurse: Thad Ranger, RN, Denise Date/Time (Eastern Time): 09/26/2020 3:07:20 PM Confirm and document reason for call. If symptomatic, describe symptoms. ---Pt was in the hospital last week for foot and wrist pain but had HTN at the ER. Today he felt jittery, sweating and light headed. BP today 117/59, 111/55. Does the patient have any new or worsening symptoms? ---Yes Will a triage be completed? ---Yes Related visit to physician within the last 2 weeks? ---Yes Does the PT have any chronic conditions? (i.e. diabetes, asthma, this includes High risk factors for pregnancy, etc.) ---Yes List chronic conditions. ---Kidney dx, Liver transplant Is this a behavioral health or substance abuse call? ---No Guidelines Guideline Title Affirmed Question Affirmed Notes Nurse Date/Time (Eastern Time) Dizziness - Lightheadedness SEVERE dizziness (e.g., unable to stand, requires support to walk, feels like passing out now) [place], Solicitor 09/26/2020 3:12:25 PM PLEASE NOTE: All timestamps  contained within this report are represented as [place] Standard Time. CONFIDENTIALTY NOTICE: This fax transmission is intended only for the addressee. It contains information that is legally privileged, confidential or otherwise protected from use or disclosure. If you are not the intended recipient, you are strictly prohibited from reviewing, disclosing, copying using or disseminating any of this information or taking any action in reliance on or regarding this information. If you have received this fax in error, please notify us immediately by telephone so that we can arrange for its return to [place]. Phone: [phone], Toll-Free: [phone], Fax: [phone] Page: 2 of 2 Call Id: [id number] [place]. Time Eilene Ghazi Time) Disposition Final User 09/26/2020 3:06:15 PM Send to Urgent Delrae Rend 09/26/2020 3:16:16 PM Go to ED Now (or PCP triage) Yes [place], RN, Yevette Edwards Disagree/Comply Comply Caller Understands Yes PreDisposition Call Doctor Care Advice Given Per Guideline GO TO ED NOW (OR PCP TRIAGE): ANOTHER ADULT SHOULD DRIVE: * It is better and safer if another adult drives instead of you. BRING MEDICINES: * Bring a list of your current medicines when you go to the Emergency Department (ER). CARE ADVICE given per Dizziness (Adult) guideline. Referrals GO TO FACILITY OTHER - SPECIFY

## 2020-09-27 NOTE — Telephone Encounter (Signed)
Please check on him If his BP is still running low, especially with any ongoing symptoms---he should cut that amlodipine in half (only take 5mg )

## 2020-09-27 NOTE — Telephone Encounter (Signed)
Left message to call office, again.

## 2020-09-27 NOTE — Telephone Encounter (Signed)
Left detailed message on VM per DPR. Asked him to call me with an update.

## 2020-09-29 DIAGNOSIS — M67873 Other specified disorders of tendon, right ankle and foot: Secondary | ICD-10-CM | POA: Diagnosis not present

## 2020-09-29 DIAGNOSIS — R1110 Vomiting, unspecified: Secondary | ICD-10-CM | POA: Diagnosis not present

## 2020-09-29 DIAGNOSIS — Z79899 Other long term (current) drug therapy: Secondary | ICD-10-CM | POA: Diagnosis not present

## 2020-09-29 DIAGNOSIS — S0990XA Unspecified injury of head, initial encounter: Secondary | ICD-10-CM | POA: Diagnosis not present

## 2020-09-29 DIAGNOSIS — M1A09X1 Idiopathic chronic gout, multiple sites, with tophus (tophi): Secondary | ICD-10-CM | POA: Diagnosis not present

## 2020-09-29 DIAGNOSIS — M25531 Pain in right wrist: Secondary | ICD-10-CM | POA: Diagnosis not present

## 2020-09-29 DIAGNOSIS — Z28310 Unvaccinated for covid-19: Secondary | ICD-10-CM | POA: Diagnosis not present

## 2020-09-29 DIAGNOSIS — M79604 Pain in right leg: Secondary | ICD-10-CM | POA: Diagnosis not present

## 2020-09-29 DIAGNOSIS — R197 Diarrhea, unspecified: Secondary | ICD-10-CM | POA: Diagnosis not present

## 2020-09-29 DIAGNOSIS — M109 Gout, unspecified: Secondary | ICD-10-CM | POA: Diagnosis not present

## 2020-09-29 DIAGNOSIS — E860 Dehydration: Secondary | ICD-10-CM | POA: Diagnosis not present

## 2020-09-29 DIAGNOSIS — M1A9XX1 Chronic gout, unspecified, with tophus (tophi): Secondary | ICD-10-CM | POA: Diagnosis not present

## 2020-09-29 DIAGNOSIS — N189 Chronic kidney disease, unspecified: Secondary | ICD-10-CM | POA: Diagnosis not present

## 2020-09-29 DIAGNOSIS — M21612 Bunion of left foot: Secondary | ICD-10-CM | POA: Diagnosis not present

## 2020-09-29 DIAGNOSIS — W19XXXA Unspecified fall, initial encounter: Secondary | ICD-10-CM | POA: Diagnosis not present

## 2020-09-29 DIAGNOSIS — M19031 Primary osteoarthritis, right wrist: Secondary | ICD-10-CM | POA: Diagnosis not present

## 2020-09-29 DIAGNOSIS — M21611 Bunion of right foot: Secondary | ICD-10-CM | POA: Diagnosis not present

## 2020-09-29 DIAGNOSIS — I129 Hypertensive chronic kidney disease with stage 1 through stage 4 chronic kidney disease, or unspecified chronic kidney disease: Secondary | ICD-10-CM | POA: Diagnosis not present

## 2020-09-29 DIAGNOSIS — N183 Chronic kidney disease, stage 3 unspecified: Secondary | ICD-10-CM | POA: Diagnosis not present

## 2020-09-29 DIAGNOSIS — E861 Hypovolemia: Secondary | ICD-10-CM | POA: Diagnosis not present

## 2020-09-29 DIAGNOSIS — M7989 Other specified soft tissue disorders: Secondary | ICD-10-CM | POA: Diagnosis not present

## 2020-09-29 DIAGNOSIS — D84821 Immunodeficiency due to drugs: Secondary | ICD-10-CM | POA: Diagnosis not present

## 2020-09-29 DIAGNOSIS — E0781 Sick-euthyroid syndrome: Secondary | ICD-10-CM | POA: Diagnosis not present

## 2020-09-29 DIAGNOSIS — I959 Hypotension, unspecified: Secondary | ICD-10-CM | POA: Diagnosis not present

## 2020-09-29 DIAGNOSIS — M19071 Primary osteoarthritis, right ankle and foot: Secondary | ICD-10-CM | POA: Diagnosis not present

## 2020-09-29 DIAGNOSIS — Z20822 Contact with and (suspected) exposure to covid-19: Secondary | ICD-10-CM | POA: Diagnosis not present

## 2020-09-29 DIAGNOSIS — Z7952 Long term (current) use of systemic steroids: Secondary | ICD-10-CM | POA: Diagnosis not present

## 2020-09-29 DIAGNOSIS — E869 Volume depletion, unspecified: Secondary | ICD-10-CM | POA: Diagnosis not present

## 2020-09-29 DIAGNOSIS — H8110 Benign paroxysmal vertigo, unspecified ear: Secondary | ICD-10-CM | POA: Diagnosis not present

## 2020-09-29 DIAGNOSIS — K219 Gastro-esophageal reflux disease without esophagitis: Secondary | ICD-10-CM | POA: Diagnosis not present

## 2020-09-29 DIAGNOSIS — Z944 Liver transplant status: Secondary | ICD-10-CM | POA: Diagnosis not present

## 2020-10-02 NOTE — Telephone Encounter (Signed)
Pt returned Shannon's call. He did state he is still in hospital. I did advise him PCP/ Assistant are aware and once he is d/c, the hospital with let him know when to f/u with PCP. Pt thanked PCP for following up with him.

## 2020-10-02 NOTE — Telephone Encounter (Signed)
I tried to call pt. Again. Discovered he was admitted to [facility] on 4-15 and is still there.

## 2020-10-03 DIAGNOSIS — E0781 Sick-euthyroid syndrome: Secondary | ICD-10-CM | POA: Insufficient documentation

## 2020-10-10 ENCOUNTER — Ambulatory Visit (INDEPENDENT_AMBULATORY_CARE_PROVIDER_SITE_OTHER): Payer: Medicare Other | Admitting: Internal Medicine

## 2020-10-10 ENCOUNTER — Other Ambulatory Visit: Payer: Self-pay

## 2020-10-10 ENCOUNTER — Encounter: Payer: Self-pay | Admitting: Internal Medicine

## 2020-10-10 VITALS — BP 110/76 | HR 83 | Temp 96.8°F | Ht 71.0 in | Wt 271.0 lb

## 2020-10-10 DIAGNOSIS — I10 Essential (primary) hypertension: Secondary | ICD-10-CM | POA: Diagnosis not present

## 2020-10-10 DIAGNOSIS — D122 Benign neoplasm of ascending colon: Secondary | ICD-10-CM | POA: Diagnosis not present

## 2020-10-10 DIAGNOSIS — M1A9XX1 Chronic gout, unspecified, with tophus (tophi): Secondary | ICD-10-CM

## 2020-10-10 DIAGNOSIS — N189 Chronic kidney disease, unspecified: Secondary | ICD-10-CM | POA: Diagnosis not present

## 2020-10-10 DIAGNOSIS — M47814 Spondylosis without myelopathy or radiculopathy, thoracic region: Secondary | ICD-10-CM | POA: Diagnosis not present

## 2020-10-10 DIAGNOSIS — H538 Other visual disturbances: Secondary | ICD-10-CM | POA: Diagnosis not present

## 2020-10-10 DIAGNOSIS — R918 Other nonspecific abnormal finding of lung field: Secondary | ICD-10-CM | POA: Diagnosis not present

## 2020-10-10 DIAGNOSIS — H04123 Dry eye syndrome of bilateral lacrimal glands: Secondary | ICD-10-CM | POA: Diagnosis not present

## 2020-10-10 DIAGNOSIS — H9319 Tinnitus, unspecified ear: Secondary | ICD-10-CM | POA: Diagnosis not present

## 2020-10-10 DIAGNOSIS — D175 Benign lipomatous neoplasm of intra-abdominal organs: Secondary | ICD-10-CM | POA: Diagnosis not present

## 2020-10-10 DIAGNOSIS — N1831 Chronic kidney disease, stage 3a: Secondary | ICD-10-CM | POA: Diagnosis not present

## 2020-10-10 DIAGNOSIS — K760 Fatty (change of) liver, not elsewhere classified: Secondary | ICD-10-CM | POA: Diagnosis not present

## 2020-10-10 DIAGNOSIS — K754 Autoimmune hepatitis: Secondary | ICD-10-CM | POA: Diagnosis not present

## 2020-10-10 DIAGNOSIS — D120 Benign neoplasm of cecum: Secondary | ICD-10-CM | POA: Diagnosis not present

## 2020-10-10 DIAGNOSIS — I129 Hypertensive chronic kidney disease with stage 1 through stage 4 chronic kidney disease, or unspecified chronic kidney disease: Secondary | ICD-10-CM | POA: Diagnosis not present

## 2020-10-10 DIAGNOSIS — R933 Abnormal findings on diagnostic imaging of other parts of digestive tract: Secondary | ICD-10-CM | POA: Diagnosis not present

## 2020-10-10 DIAGNOSIS — K36 Other appendicitis: Secondary | ICD-10-CM | POA: Diagnosis not present

## 2020-10-10 DIAGNOSIS — R1900 Intra-abdominal and pelvic swelling, mass and lump, unspecified site: Secondary | ICD-10-CM | POA: Diagnosis not present

## 2020-10-10 DIAGNOSIS — B029 Zoster without complications: Secondary | ICD-10-CM | POA: Diagnosis not present

## 2020-10-10 DIAGNOSIS — R197 Diarrhea, unspecified: Secondary | ICD-10-CM | POA: Diagnosis not present

## 2020-10-10 DIAGNOSIS — N179 Acute kidney failure, unspecified: Secondary | ICD-10-CM | POA: Diagnosis not present

## 2020-10-10 DIAGNOSIS — R338 Other retention of urine: Secondary | ICD-10-CM | POA: Diagnosis not present

## 2020-10-10 DIAGNOSIS — E875 Hyperkalemia: Secondary | ICD-10-CM | POA: Diagnosis not present

## 2020-10-10 DIAGNOSIS — D126 Benign neoplasm of colon, unspecified: Secondary | ICD-10-CM | POA: Diagnosis not present

## 2020-10-10 DIAGNOSIS — S39011A Strain of muscle, fascia and tendon of abdomen, initial encounter: Secondary | ICD-10-CM | POA: Diagnosis not present

## 2020-10-10 DIAGNOSIS — Z944 Liver transplant status: Secondary | ICD-10-CM | POA: Diagnosis not present

## 2020-10-10 DIAGNOSIS — D84821 Immunodeficiency due to drugs: Secondary | ICD-10-CM | POA: Diagnosis not present

## 2020-10-10 DIAGNOSIS — D121 Benign neoplasm of appendix: Secondary | ICD-10-CM | POA: Diagnosis not present

## 2020-10-10 DIAGNOSIS — K529 Noninfective gastroenteritis and colitis, unspecified: Secondary | ICD-10-CM | POA: Diagnosis not present

## 2020-10-10 DIAGNOSIS — Z20822 Contact with and (suspected) exposure to covid-19: Secondary | ICD-10-CM | POA: Diagnosis not present

## 2020-10-10 DIAGNOSIS — K648 Other hemorrhoids: Secondary | ICD-10-CM | POA: Diagnosis not present

## 2020-10-10 DIAGNOSIS — K388 Other specified diseases of appendix: Secondary | ICD-10-CM | POA: Diagnosis not present

## 2020-10-10 DIAGNOSIS — R21 Rash and other nonspecific skin eruption: Secondary | ICD-10-CM | POA: Diagnosis not present

## 2020-10-10 DIAGNOSIS — K5730 Diverticulosis of large intestine without perforation or abscess without bleeding: Secondary | ICD-10-CM | POA: Diagnosis not present

## 2020-10-10 DIAGNOSIS — R200 Anesthesia of skin: Secondary | ICD-10-CM | POA: Diagnosis not present

## 2020-10-10 DIAGNOSIS — R202 Paresthesia of skin: Secondary | ICD-10-CM | POA: Diagnosis not present

## 2020-10-10 DIAGNOSIS — K219 Gastro-esophageal reflux disease without esophagitis: Secondary | ICD-10-CM | POA: Diagnosis not present

## 2020-10-10 DIAGNOSIS — D123 Benign neoplasm of transverse colon: Secondary | ICD-10-CM | POA: Diagnosis not present

## 2020-10-10 DIAGNOSIS — Z538 Procedure and treatment not carried out for other reasons: Secondary | ICD-10-CM | POA: Diagnosis not present

## 2020-10-10 DIAGNOSIS — H539 Unspecified visual disturbance: Secondary | ICD-10-CM | POA: Diagnosis not present

## 2020-10-10 DIAGNOSIS — Z119 Encounter for screening for infectious and parasitic diseases, unspecified: Secondary | ICD-10-CM | POA: Diagnosis not present

## 2020-10-10 DIAGNOSIS — H3581 Retinal edema: Secondary | ICD-10-CM | POA: Diagnosis not present

## 2020-10-10 MED ORDER — PREDNISONE 20 MG PO TABS: 40.0000 mg | ORAL_TABLET | Freq: Every day | ORAL | 0 refills | Status: DC

## 2020-10-10 NOTE — Assessment & Plan Note (Signed)
Severe and in multiple locations Ongoing synovitis and pain Allopurinol started but will likely need to be titrated (goal at least under 6 and maybe under 5) colchcine daily for now while titrating allopurinol Will give prednisone burst again for symptom relief

## 2020-10-10 NOTE — Progress Notes (Signed)
Subjective:    Patient ID: Devin White, male    DOB: Oct 18, 1955, 65 y.o.   MRN: [id number]  HPI Here for hospital follow up---after gouty attack This visit occurred during the SARS-CoV-2 public health emergency.  Safety protocols were in place, including screening questions prior to the visit, additional usage of staff PPE, and extensive cleaning of exam room while observing appropriate contact time as indicated for disinfecting solutions.   Fell in shower--passed out Was feeling bad/lighteaded Had progression of gout to left foot, right knee, right wrist MRI and x-rays confirmed gouty lesions all over  Started low dose allopurinol--50mg  Colchicine daily for now as well  Was briefly on antibiotics--in case there was infection at Achilles (had gram positive cocci on smear but not clearly infected)  BP went high for a while Put on lisinopril 5mg  Was on another one--hydralazine--but this is the last day  Current Outpatient Medications on File Prior to Visit  Medication Sig Dispense Refill  . allopurinol (ZYLOPRIM) 100 MG tablet Take 0.5 tablets by mouth daily.    Marland Kitchen amLODipine (NORVASC) 10 MG tablet Take 1 tablet (10 mg total) by mouth daily. 30 tablet 0  . calcium carbonate (TUMS) 500 MG chewable tablet Chew 1 tablet (200 mg of elemental calcium total) by mouth 2 (two) times daily as needed for indigestion or heartburn. 180 tablet 1  . colchicine 0.6 MG tablet Take 1 tablet by mouth daily.    . diclofenac Sodium (VOLTAREN) 1 % GEL Apply topically.    . hydrALAZINE (APRESOLINE) 25 MG tablet Take 1 tablet by mouth daily.    Marland Kitchen lisinopril (ZESTRIL) 5 MG tablet Take 1 tablet by mouth daily.    . mycophenolate (CELLCEPT) 250 MG capsule Take 250-500 mg by mouth 2 (two) times daily. 2 capsules (500 mg) in the morning and one capsule (250 mg) in the evening    . omeprazole (PRILOSEC) 20 MG capsule TAKE 1 CAPSULE BY MOUTH TWICE DAILY 180 capsule 3  . predniSONE (DELTASONE) 5 MG tablet Take  5 mg by mouth daily with breakfast.    . tacrolimus (PROGRAF) 0.5 MG capsule Take 0.5-1 mg by mouth See admin instructions. Take 2 capsules (1mg ) by mouth every morning and 1 capsule (0.5mg ) by mouth every night    . tamsulosin (FLOMAX) 0.4 MG CAPS capsule Take 1 capsule (0.4 mg total) by mouth daily. 90 capsule 1   No current facility-administered medications on file prior to visit.    No Known Allergies  Past Medical History:  Diagnosis Date  . Arthritis    back and legs  . Benign prostatic hypertrophy   . GERD (gastroesophageal reflux disease)   . Gout   . History of blood transfusion    pt has antibodies in his blood since previous transfusions  . History of cirrhosis of liver S/P TRANSPLANT 2009  . History of liver failure S/P TRANSPLANT  . Hypertension   . Left ureteral calculus     Past Surgical History:  Procedure Laterality Date  . bone morrow biopsy    . CHOLECYSTECTOMY  2007  . CYSTO/ LEFT RETROGRADE PYELOGRAM/ LEFT URETERAL STENT PLACEMENT  03-05-2012  DR Tresa Moore [facility])   LEFT URETERAL CALCULI  . CYSTOSCOPY W/ URETERAL STENT PLACEMENT  03/11/2012   Procedure: CYSTOSCOPY WITH STENT REPLACEMENT;  Surgeon: Alexis Frock, MD;  Location: [facility];  Service: Urology;  Laterality: Left;  . HERNIA REPAIR  2008  . LIVER BIOPSY    . LIVER TRANSPLANT  11/24/2007   pt states doing well since liver transplant  . LUMBAR DISC SURGERY    . LUMBAR FUSION    . removal of fistula    . URETEROSCOPY  03/11/2012   Procedure: URETEROSCOPY;  Surgeon: Alexis Frock, MD;  Location: [facility];  Service: Urology;  Laterality: Left;   STONE MANIPULATION, stone obtained  [place] [facility]    Family History  Problem Relation Age of Onset  . Cancer Mother        colon  . Arthritis Mother   . Vasculitis Mother   . Hypertension Mother   . Cancer Father        colon  . Kidney disease Father   . Arthritis Father   . Arthritis Brother   . Alcohol abuse  Maternal Aunt   . Diabetes Maternal Aunt   . Alcohol abuse Maternal Uncle   . Diabetes Paternal Aunt   . Alcohol abuse Maternal Uncle   . Alcohol abuse Maternal Uncle     Social History   Socioeconomic History  . Marital status: Single    Spouse name: Not on file  . Number of children: 3  . Years of education: Not on file  . Highest education level: Not on file  Occupational History  . Occupation: disabled    Employer: RETIRED  Tobacco Use  . Smoking status: Never Smoker  . Smokeless tobacco: Never Used  Substance and Sexual Activity  . Alcohol use: No  . Drug use: No  . Sexual activity: Not on file  Other Topics Concern  . Not on file  Social History Narrative   ** Merged History Encounter **       Has living will Requests daughter [name] as his health care POA Would accept brief attempt at resuscitation but no prolonged ventilation No tube feeds if cognitively unaware   Social Determinants of Health   Financial Resource Strain: Not on file  Food Insecurity: Not on file  Transportation Needs: Not on file  Physical Activity: Not on file  Stress: Not on file  Social Connections: Not on file  Intimate Partner Violence: Not on file   Review of Systems Still having bad joint pains--left heel, right Achilles and right wrist No N/V now Some dizziness--still some tinnitus on right Headaches persist Appetite is okay--eats 2 meals per day Sleep is interrupted---still tired in the morning    Objective:   Physical Exam Constitutional:      Appearance: Normal appearance.  Cardiovascular:     Rate and Rhythm: Normal rate and regular rhythm.     Heart sounds: No murmur heard. No gallop.   Pulmonary:     Effort: Pulmonary effort is normal.     Breath sounds: Normal breath sounds. No wheezing or rales.  Musculoskeletal:     Cervical back: Neck supple.     Comments: Still with large right Achilles tophus---also at right elbow Acute synovitis in right wrist   Lymphadenopathy:     Cervical: No cervical adenopathy.  Neurological:     Mental Status: He is alert.            Assessment & Plan:

## 2020-10-10 NOTE — Assessment & Plan Note (Signed)
Admitted with syncope and orthostasis--but then elevated in hospital at [facility] Lisinopril started Hydralazine as well--but done with that today Still on amlodipine  BP Readings from Last 3 Encounters:  10/10/20 110/76  09/25/20 140/84  09/24/20 (!) 179/113

## 2020-10-11 DIAGNOSIS — N179 Acute kidney failure, unspecified: Secondary | ICD-10-CM | POA: Diagnosis not present

## 2020-10-11 DIAGNOSIS — M47814 Spondylosis without myelopathy or radiculopathy, thoracic region: Secondary | ICD-10-CM | POA: Diagnosis not present

## 2020-10-11 DIAGNOSIS — K760 Fatty (change of) liver, not elsewhere classified: Secondary | ICD-10-CM | POA: Diagnosis not present

## 2020-10-11 DIAGNOSIS — H539 Unspecified visual disturbance: Secondary | ICD-10-CM | POA: Diagnosis not present

## 2020-10-11 DIAGNOSIS — K388 Other specified diseases of appendix: Secondary | ICD-10-CM | POA: Diagnosis not present

## 2020-10-11 DIAGNOSIS — R918 Other nonspecific abnormal finding of lung field: Secondary | ICD-10-CM | POA: Diagnosis not present

## 2020-10-11 DIAGNOSIS — R197 Diarrhea, unspecified: Secondary | ICD-10-CM | POA: Diagnosis not present

## 2020-10-11 DIAGNOSIS — M1A9XX1 Chronic gout, unspecified, with tophus (tophi): Secondary | ICD-10-CM | POA: Diagnosis not present

## 2020-10-11 DIAGNOSIS — H538 Other visual disturbances: Secondary | ICD-10-CM | POA: Diagnosis not present

## 2020-10-11 DIAGNOSIS — B029 Zoster without complications: Secondary | ICD-10-CM | POA: Diagnosis not present

## 2020-10-11 DIAGNOSIS — Z944 Liver transplant status: Secondary | ICD-10-CM | POA: Diagnosis not present

## 2020-10-11 DIAGNOSIS — N189 Chronic kidney disease, unspecified: Secondary | ICD-10-CM | POA: Diagnosis not present

## 2020-10-11 DIAGNOSIS — H9319 Tinnitus, unspecified ear: Secondary | ICD-10-CM | POA: Diagnosis not present

## 2020-10-11 DIAGNOSIS — R200 Anesthesia of skin: Secondary | ICD-10-CM | POA: Diagnosis not present

## 2020-10-12 ENCOUNTER — Telehealth: Payer: Self-pay

## 2020-10-12 DIAGNOSIS — K388 Other specified diseases of appendix: Secondary | ICD-10-CM | POA: Diagnosis not present

## 2020-10-12 DIAGNOSIS — H538 Other visual disturbances: Secondary | ICD-10-CM | POA: Diagnosis not present

## 2020-10-12 DIAGNOSIS — R197 Diarrhea, unspecified: Secondary | ICD-10-CM | POA: Diagnosis not present

## 2020-10-12 DIAGNOSIS — Z944 Liver transplant status: Secondary | ICD-10-CM | POA: Diagnosis not present

## 2020-10-12 DIAGNOSIS — H04123 Dry eye syndrome of bilateral lacrimal glands: Secondary | ICD-10-CM | POA: Diagnosis not present

## 2020-10-12 DIAGNOSIS — N189 Chronic kidney disease, unspecified: Secondary | ICD-10-CM | POA: Diagnosis not present

## 2020-10-12 DIAGNOSIS — B029 Zoster without complications: Secondary | ICD-10-CM | POA: Diagnosis not present

## 2020-10-12 DIAGNOSIS — M1A9XX1 Chronic gout, unspecified, with tophus (tophi): Secondary | ICD-10-CM | POA: Diagnosis not present

## 2020-10-12 DIAGNOSIS — H3581 Retinal edema: Secondary | ICD-10-CM | POA: Diagnosis not present

## 2020-10-12 DIAGNOSIS — R202 Paresthesia of skin: Secondary | ICD-10-CM | POA: Diagnosis not present

## 2020-10-12 DIAGNOSIS — N179 Acute kidney failure, unspecified: Secondary | ICD-10-CM | POA: Diagnosis not present

## 2020-10-12 DIAGNOSIS — Z119 Encounter for screening for infectious and parasitic diseases, unspecified: Secondary | ICD-10-CM | POA: Diagnosis not present

## 2020-10-12 NOTE — Telephone Encounter (Signed)
Patient called and stated that he was admitted to [facility] for worsening gout. While there he began having worsening abdominal pain. Patient stated that they did a CT scan and told him that he has a mass on his appendix that they believe to be cancer. Patient will be having a colonoscopy with biopsy and wanted to make Dr. Silvio Pate aware of what was going on. Instructed patient that I would let Dr. Silvio Pate know and if he needed anything to let us know. Patient very appreciative.

## 2020-10-12 NOTE — Telephone Encounter (Signed)
I reviewed his records from [place] and the abdominal CT scan They are planning a colonoscopy to biopsy the mass---but he will need it removed eventually no matter what

## 2020-10-13 DIAGNOSIS — K5730 Diverticulosis of large intestine without perforation or abscess without bleeding: Secondary | ICD-10-CM | POA: Diagnosis not present

## 2020-10-13 DIAGNOSIS — M1A9XX1 Chronic gout, unspecified, with tophus (tophi): Secondary | ICD-10-CM | POA: Diagnosis not present

## 2020-10-13 DIAGNOSIS — D123 Benign neoplasm of transverse colon: Secondary | ICD-10-CM | POA: Diagnosis not present

## 2020-10-13 DIAGNOSIS — K648 Other hemorrhoids: Secondary | ICD-10-CM | POA: Diagnosis not present

## 2020-10-13 DIAGNOSIS — R933 Abnormal findings on diagnostic imaging of other parts of digestive tract: Secondary | ICD-10-CM | POA: Diagnosis not present

## 2020-10-13 DIAGNOSIS — Z944 Liver transplant status: Secondary | ICD-10-CM | POA: Diagnosis not present

## 2020-10-13 DIAGNOSIS — B029 Zoster without complications: Secondary | ICD-10-CM | POA: Diagnosis not present

## 2020-10-13 DIAGNOSIS — R1900 Intra-abdominal and pelvic swelling, mass and lump, unspecified site: Secondary | ICD-10-CM | POA: Diagnosis not present

## 2020-10-13 DIAGNOSIS — D122 Benign neoplasm of ascending colon: Secondary | ICD-10-CM | POA: Diagnosis not present

## 2020-10-13 DIAGNOSIS — N179 Acute kidney failure, unspecified: Secondary | ICD-10-CM | POA: Diagnosis not present

## 2020-10-13 DIAGNOSIS — N189 Chronic kidney disease, unspecified: Secondary | ICD-10-CM | POA: Diagnosis not present

## 2020-10-13 DIAGNOSIS — D175 Benign lipomatous neoplasm of intra-abdominal organs: Secondary | ICD-10-CM | POA: Diagnosis not present

## 2020-10-13 DIAGNOSIS — R197 Diarrhea, unspecified: Secondary | ICD-10-CM | POA: Diagnosis not present

## 2020-10-14 DIAGNOSIS — M1A9XX1 Chronic gout, unspecified, with tophus (tophi): Secondary | ICD-10-CM | POA: Diagnosis not present

## 2020-10-20 ENCOUNTER — Ambulatory Visit (INDEPENDENT_AMBULATORY_CARE_PROVIDER_SITE_OTHER): Payer: Medicare Other | Admitting: Internal Medicine

## 2020-10-20 ENCOUNTER — Encounter: Payer: Self-pay | Admitting: Internal Medicine

## 2020-10-20 ENCOUNTER — Other Ambulatory Visit: Payer: Self-pay

## 2020-10-20 ENCOUNTER — Other Ambulatory Visit: Payer: Medicare Other

## 2020-10-20 VITALS — BP 102/70 | HR 110 | Temp 97.7°F | Ht 71.0 in | Wt 264.0 lb

## 2020-10-20 DIAGNOSIS — M1A9XX1 Chronic gout, unspecified, with tophus (tophi): Secondary | ICD-10-CM

## 2020-10-20 NOTE — Assessment & Plan Note (Signed)
Apparently got anakinra while in [facility]--that did help Prednisone put back down to 5mg  Is improved but still with sig tophi Allopurinol increased to 100mg ----does go back to the rheumatologist in July Continues on daily colchicine

## 2020-10-20 NOTE — Progress Notes (Signed)
Subjective:    Patient ID: Devin White, male    DOB: 1956/06/10, 65 y.o.   MRN: [id number]  HPI Here for hospital follow up again This visit occurred during the SARS-CoV-2 public health emergency.  Safety protocols were in place, including screening questions prior to the visit, additional usage of staff PPE, and extensive cleaning of exam room while observing appropriate contact time as indicated for disinfecting solutions.   Went back to [facility] due to worsening gout problems Only thing that helped was injections--got 3 of them----?anakinra Allopurinol was increased to 100mg   Started having some sharp abdominal pain while in ER Got CT scan---showed appendiceal mass Colonoscopy done---2 polyps that were adenomatous  [facility] cut back the prednisone to 5mg  daily Right foot is better Less swollen Still with tophaceous swelling on medial left ankle though  Current Outpatient Medications on File Prior to Visit  Medication Sig Dispense Refill  . allopurinol (ZYLOPRIM) 100 MG tablet Take 1 tablet by mouth daily.    Marland Kitchen amLODipine (NORVASC) 10 MG tablet Take 1 tablet (10 mg total) by mouth daily. 30 tablet 0  . calcium carbonate (TUMS) 500 MG chewable tablet Chew 1 tablet (200 mg of elemental calcium total) by mouth 2 (two) times daily as needed for indigestion or heartburn. 180 tablet 1  . colchicine 0.6 MG tablet Take 1 tablet by mouth daily.    . diclofenac Sodium (VOLTAREN) 1 % GEL Apply topically.    . hydrALAZINE (APRESOLINE) 25 MG tablet Take 1 tablet by mouth daily.    . mycophenolate (CELLCEPT) 250 MG capsule Take 250-500 mg by mouth 2 (two) times daily. 2 capsules (500 mg) in the morning and one capsule (250 mg) in the evening    . omeprazole (PRILOSEC) 20 MG capsule TAKE 1 CAPSULE BY MOUTH TWICE DAILY 180 capsule 3  . predniSONE (DELTASONE) 5 MG tablet Take 5 mg by mouth daily with breakfast.    . tacrolimus (PROGRAF) 0.5 MG capsule Take 0.5-1 mg by mouth See admin instructions.  Take 2 capsules (1mg ) by mouth every morning and 1 capsule (0.5mg ) by mouth every night    . tamsulosin (FLOMAX) 0.4 MG CAPS capsule Take 1 capsule (0.4 mg total) by mouth daily. 90 capsule 1  . lisinopril (ZESTRIL) 5 MG tablet Take 1 tablet by mouth daily. (Patient not taking: Reported on 10/20/2020)     No current facility-administered medications on file prior to visit.    No Known Allergies  Past Medical History:  Diagnosis Date  . Arthritis    back and legs  . Benign prostatic hypertrophy   . GERD (gastroesophageal reflux disease)   . Gout   . History of blood transfusion    pt has antibodies in his blood since previous transfusions  . History of cirrhosis of liver S/P TRANSPLANT 2009  . History of liver failure S/P TRANSPLANT  . Hypertension   . Left ureteral calculus     Past Surgical History:  Procedure Laterality Date  . bone morrow biopsy    . CHOLECYSTECTOMY  2007  . CYSTO/ LEFT RETROGRADE PYELOGRAM/ LEFT URETERAL STENT PLACEMENT  03-05-2012  DR Tresa Moore [facility])   LEFT URETERAL CALCULI  . CYSTOSCOPY W/ URETERAL STENT PLACEMENT  03/11/2012   Procedure: CYSTOSCOPY WITH STENT REPLACEMENT;  Surgeon: Alexis Frock, MD;  Location: [facility];  Service: Urology;  Laterality: Left;  . HERNIA REPAIR  2008  . LIVER BIOPSY    . LIVER TRANSPLANT  11/24/2007   pt  states doing well since liver transplant  . LUMBAR DISC SURGERY    . LUMBAR FUSION    . removal of fistula    . URETEROSCOPY  03/11/2012   Procedure: URETEROSCOPY;  Surgeon: Alexis Frock, MD;  Location: [facility];  Service: Urology;  Laterality: Left;   STONE MANIPULATION, stone obtained  [place] [facility]    Family History  Problem Relation Age of Onset  . Cancer Mother        colon  . Arthritis Mother   . Vasculitis Mother   . Hypertension Mother   . Cancer Father        colon  . Kidney disease Father   . Arthritis Father   . Arthritis Brother   . Alcohol abuse Maternal Aunt    . Diabetes Maternal Aunt   . Alcohol abuse Maternal Uncle   . Diabetes Paternal Aunt   . Alcohol abuse Maternal Uncle   . Alcohol abuse Maternal Uncle     Social History   Socioeconomic History  . Marital status: Single    Spouse name: Not on file  . Number of children: 3  . Years of education: Not on file  . Highest education level: Not on file  Occupational History  . Occupation: disabled    Employer: RETIRED  Tobacco Use  . Smoking status: Never Smoker  . Smokeless tobacco: Never Used  Substance and Sexual Activity  . Alcohol use: No  . Drug use: No  . Sexual activity: Not on file  Other Topics Concern  . Not on file  Social History Narrative   ** Merged History Encounter **       Has living will Requests daughter [name] as his health care POA Would accept brief attempt at resuscitation but no prolonged ventilation No tube feeds if cognitively unaware   Social Determinants of Health   Financial Resource Strain: Not on file  Food Insecurity: Not on file  Transportation Needs: Not on file  Physical Activity: Not on file  Stress: Not on file  Social Connections: Not on file  Intimate Partner Violence: Not on file   Review of Systems  No fever Mild chills at times Has lost 8# in the past week----food "goes right through me"     Objective:   Physical Exam Musculoskeletal:     Comments: Tophaceous deposits on medial left ankle and 1st MTP Still on right Achilles tendon as well Not as inflamed            Assessment & Plan:

## 2020-10-29 ENCOUNTER — Emergency Department: Payer: Medicare Other

## 2020-10-29 ENCOUNTER — Encounter: Payer: Self-pay | Admitting: Emergency Medicine

## 2020-10-29 ENCOUNTER — Other Ambulatory Visit: Payer: Self-pay

## 2020-10-29 ENCOUNTER — Emergency Department
Admission: EM | Admit: 2020-10-29 | Discharge: 2020-10-29 | Disposition: A | Discharge status: Home / Self Care | Payer: Medicare Other | Attending: Emergency Medicine | Admitting: Emergency Medicine

## 2020-10-29 DIAGNOSIS — K529 Noninfective gastroenteritis and colitis, unspecified: Secondary | ICD-10-CM | POA: Diagnosis not present

## 2020-10-29 DIAGNOSIS — I129 Hypertensive chronic kidney disease with stage 1 through stage 4 chronic kidney disease, or unspecified chronic kidney disease: Secondary | ICD-10-CM | POA: Insufficient documentation

## 2020-10-29 DIAGNOSIS — Z79899 Other long term (current) drug therapy: Secondary | ICD-10-CM | POA: Insufficient documentation

## 2020-10-29 DIAGNOSIS — R000 Tachycardia, unspecified: Secondary | ICD-10-CM | POA: Diagnosis not present

## 2020-10-29 DIAGNOSIS — N1832 Chronic kidney disease, stage 3b: Secondary | ICD-10-CM | POA: Insufficient documentation

## 2020-10-29 DIAGNOSIS — K5730 Diverticulosis of large intestine without perforation or abscess without bleeding: Secondary | ICD-10-CM | POA: Diagnosis not present

## 2020-10-29 DIAGNOSIS — I700 Atherosclerosis of aorta: Secondary | ICD-10-CM | POA: Diagnosis not present

## 2020-10-29 DIAGNOSIS — R0602 Shortness of breath: Secondary | ICD-10-CM | POA: Diagnosis not present

## 2020-10-29 DIAGNOSIS — N200 Calculus of kidney: Secondary | ICD-10-CM | POA: Diagnosis not present

## 2020-10-29 DIAGNOSIS — R112 Nausea with vomiting, unspecified: Secondary | ICD-10-CM | POA: Diagnosis present

## 2020-10-29 DIAGNOSIS — N281 Cyst of kidney, acquired: Secondary | ICD-10-CM | POA: Diagnosis not present

## 2020-10-29 LAB — COMPREHENSIVE METABOLIC PANEL
ALT: 67 U/L — ABNORMAL HIGH (ref 0–44)
AST: 26 U/L (ref 15–41)
Albumin: 4.2 g/dL (ref 3.5–5.0)
Alkaline Phosphatase: 50 U/L (ref 38–126)
Anion gap: 11 (ref 5–15)
BUN: 27 mg/dL — ABNORMAL HIGH (ref 8–23)
CO2: 14 mmol/L — ABNORMAL LOW (ref 22–32)
Calcium: 9.7 mg/dL (ref 8.9–10.3)
Chloride: 113 mmol/L — ABNORMAL HIGH (ref 98–111)
Creatinine, Ser: 1.69 mg/dL — ABNORMAL HIGH (ref 0.61–1.24)
GFR, Estimated: 45 mL/min — ABNORMAL LOW (ref >60)
Glucose, Bld: 133 mg/dL — ABNORMAL HIGH (ref 70–99)
Potassium: 4.1 mmol/L (ref 3.5–5.1)
Sodium: 138 mmol/L (ref 135–145)
Total Bilirubin: 1.4 mg/dL — ABNORMAL HIGH (ref 0.3–1.2)
Total Protein: 7.2 g/dL (ref 6.5–8.1)

## 2020-10-29 LAB — CBC
HCT: 46.8 % (ref 39.0–52.0)
Hemoglobin: 15.9 g/dL (ref 13.0–17.0)
MCH: 32.6 pg (ref 26.0–34.0)
MCHC: 34 g/dL (ref 30.0–36.0)
MCV: 95.9 fL (ref 80.0–100.0)
Platelets: 244 10*3/uL (ref 150–400)
RBC: 4.88 MIL/uL (ref 4.22–5.81)
RDW: 13.9 % (ref 11.5–15.5)
WBC: 16.8 10*3/uL — ABNORMAL HIGH (ref 4.0–10.5)
nRBC: 0 % (ref 0.0–0.2)

## 2020-10-29 LAB — URINALYSIS, COMPLETE (UACMP) WITH MICROSCOPIC
Bacteria, UA: NONE SEEN
Bilirubin Urine: NEGATIVE
Glucose, UA: NEGATIVE mg/dL
Ketones, ur: NEGATIVE mg/dL
Leukocytes,Ua: NEGATIVE
Nitrite: NEGATIVE
Protein, ur: NEGATIVE mg/dL
Specific Gravity, Urine: 1.014 (ref 1.005–1.030)
pH: 5 (ref 5.0–8.0)

## 2020-10-29 LAB — LIPASE, BLOOD: Lipase: 27 U/L (ref 11–51)

## 2020-10-29 MED ORDER — ONDANSETRON 4 MG PO TBDP: 4.0000 mg | ORAL_TABLET | Freq: Three times a day (TID) | ORAL | 0 refills | Status: DC | PRN

## 2020-10-29 MED ORDER — ONDANSETRON HCL 4 MG/2ML IJ SOLN
4.0000 mg | Freq: Once | INTRAMUSCULAR | Status: AC
  Administered 2020-10-29: 4 mg via INTRAVENOUS
  Filled 2020-10-29: qty 2

## 2020-10-29 MED ORDER — MORPHINE SULFATE (PF) 4 MG/ML IV SOLN
4.0000 mg | Freq: Once | INTRAVENOUS | Status: AC
  Administered 2020-10-29: 4 mg via INTRAVENOUS
  Filled 2020-10-29: qty 1

## 2020-10-29 MED ORDER — SODIUM CHLORIDE 0.9 % IV SOLN
1000.0000 mL | Freq: Once | INTRAVENOUS | Status: AC
  Administered 2020-10-29: 1000 mL via INTRAVENOUS

## 2020-10-29 NOTE — ED Triage Notes (Addendum)
Pt presents to the ED for LLQ abd pain, nausea, and diarrhea since last night. Pt has hx of cholecystectomy. Pt states that he does know he has a mass on his appendix and is suppose to see the surgeon on Tuesday.  Pt also c/o SOB. Pt does have hx of a liver transplant 13 years ago. Pt is A&Ox4 and NAD.

## 2020-10-29 NOTE — ED Notes (Signed)
Pt instructed to provide urine sample as soon as able. Urinal in reach.

## 2020-10-29 NOTE — ED Provider Notes (Signed)
[facility] Emergency Department Provider Note   ____________________________________________    I have reviewed the triage vital signs and the nursing notes.   HISTORY  Chief Complaint Abdominal Pain     HPI Devin White is a 65 y.o. male with a history of liver transplantation on immunosuppressive medications who presents with complaints of nausea vomiting and diarrhea.  He reports some abdominal cramping in his lower abdomen as well.  He has not take anything for this.  Symptoms started yesterday.  He reports 2 weeks ago he was admitted to [facility], had some right lower quadrant pain at that time, had CT scan and colonoscopy which found unusual polyp with which was apparently benign on pathology however he is still scheduled for likely surgery  Last night he developed nausea vomiting diarrhea which is watery in nature. Past Medical History:  Diagnosis Date  . Arthritis    back and legs  . Benign prostatic hypertrophy   . GERD (gastroesophageal reflux disease)   . Gout   . History of blood transfusion    pt has antibodies in his blood since previous transfusions  . History of cirrhosis of liver S/P TRANSPLANT 2009  . History of liver failure S/P TRANSPLANT  . Hypertension   . Left ureteral calculus     Patient Active Problem List   Diagnosis Date Noted  . Gastroenteritis 09/25/2020  . Achilles tendon mass 09/25/2020  . Hypertensive urgency 09/22/2020  . Joint pain 09/22/2020  . Recurrent UTI 03/26/2019  . Cystitis 12/01/2018  . Pulmonary nodule 08/04/2018  . Liver transplant status ([place]) 10/10/2017  . Advance directive discussed with patient 10/10/2017  . Stage 3b chronic kidney disease ([place]) 10/09/2016  . Mallory-Weiss tear 04/16/2016  . Long-term use of immunosuppressant medication 04/08/2016  . Sleep apnea 10/06/2015  . 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  Immunosuppression ([place]) 06/01/2012  . Hypertension   . Routine general medical examination at a health care facility 04/26/2011  . Chronic liver failure ([place]) 09/24/2010  . Chronic tophaceous gout 12/21/2008  . BPH without urinary obstruction 12/21/2008  . ALLERGIC RHINITIS 05/22/2007  . GERD 05/22/2007  . HIATAL HERNIA 05/22/2007  . IRRITABLE BOWEL SYNDROME, HX OF 05/22/2007  . RENAL CALCULUS, HX OF 05/22/2007    Past Surgical History:  Procedure Laterality Date  . bone morrow biopsy    . CHOLECYSTECTOMY  2007  . CYSTO/ LEFT RETROGRADE PYELOGRAM/ LEFT URETERAL STENT PLACEMENT  03-05-2012  DR Tresa Moore [facility])   LEFT URETERAL CALCULI  . CYSTOSCOPY W/ URETERAL STENT PLACEMENT  03/11/2012   Procedure: CYSTOSCOPY WITH STENT REPLACEMENT;  Surgeon: Alexis Frock, MD;  Location: [facility];  Service: Urology;  Laterality: Left;  . HERNIA REPAIR  2008  . LIVER BIOPSY    . LIVER TRANSPLANT  11/24/2007   pt states doing well since liver transplant  . LUMBAR DISC SURGERY    . LUMBAR FUSION    . removal of fistula    . URETEROSCOPY  03/11/2012   Procedure: URETEROSCOPY;  Surgeon: Alexis Frock, MD;  [facility];  Service: Urology;  Laterality: Left;   STONE MANIPULATION, stone obtained  [place] [facility]    Prior to Admission medications   Medication Sig Start Date End Date Taking? Authorizing Provider  ondansetron (ZOFRAN ODT) 4 MG disintegrating tablet Take 1 tablet (4 mg total) by mouth every 8 (eight) hours as needed. 10/29/20  Yes Avonte Sensabaugh,  Herbie Baltimore, MD  allopurinol (ZYLOPRIM) 100 MG tablet Take 1 tablet by mouth daily. 10/04/20 10/04/21  [provider]  amLODipine (NORVASC) 10 MG tablet Take 1 tablet (10 mg total) by mouth daily. 09/25/20   Geradine Girt, DO  calcium carbonate (TUMS) 500 MG chewable tablet Chew 1 tablet (200 mg of elemental calcium total) by mouth 2 (two) times daily as needed for indigestion or heartburn. 06/26/16   Tonia Ghent, MD  colchicine 0.6 MG tablet Take 1 tablet by mouth daily. 10/04/20   [provider]  diclofenac Sodium (VOLTAREN) 1 % GEL Apply topically. 10/03/20 10/03/21  [provider]  hydrALAZINE (APRESOLINE) 25 MG tablet Take 1 tablet by mouth daily. 10/03/20 10/11/21  [provider]  lisinopril (ZESTRIL) 5 MG tablet Take 1 tablet by mouth daily. Patient not taking: Reported on 10/20/2020 10/04/20 10/04/21  [provider]  mycophenolate (CELLCEPT) 250 MG capsule Take 250-500 mg by mouth 2 (two) times daily. 2 capsules (500 mg) in the morning and one capsule (250 mg) in the evening 02/04/19   [provider]  omeprazole (PRILOSEC) 20 MG capsule TAKE 1 CAPSULE BY MOUTH TWICE DAILY 02/28/20   Venia Carbon, MD  predniSONE (DELTASONE) 5 MG tablet Take 5 mg by mouth daily with breakfast.    [provider]  tacrolimus (PROGRAF) 0.5 MG capsule Take 0.5-1 mg by mouth See admin instructions. Take 2 capsules (1mg ) by mouth every morning and 1 capsule (0.5mg ) by mouth every night    [provider]  tamsulosin (FLOMAX) 0.4 MG CAPS capsule Take 1 capsule (0.4 mg total) by mouth daily. 06/23/20   Venia Carbon, MD     Allergies Patient has no known allergies.  Family History  Problem Relation Age of Onset  . Cancer Mother        colon  . Arthritis Mother   . Vasculitis Mother   . Hypertension Mother   . Cancer Father        colon  . Kidney disease Father   . Arthritis Father   . Arthritis Brother   . Alcohol abuse Maternal Aunt   . Diabetes Maternal Aunt   . Alcohol abuse Maternal Uncle   . Diabetes Paternal Aunt   . Alcohol abuse Maternal Uncle   . Alcohol abuse Maternal Uncle     Social History Social History   Tobacco Use  . Smoking status: Never Smoker  . Smokeless tobacco: Never Used  Substance Use Topics  . Alcohol use: No  . Drug use: No    Review of Systems  Constitutional: No fever/chills Eyes: No visual  changes.  ENT: No sore throat. Cardiovascular: Denies chest pain. Respiratory: Denies shortness of breath. Gastrointestinal: As above Genitourinary: Negative for dysuria. Musculoskeletal: Negative for back pain. Skin: Negative for rash. Neurological: Negative for headaches   ____________________________________________   PHYSICAL EXAM:  VITAL SIGNS: ED Triage Vitals  Enc Vitals Group     BP 10/29/20 1224 138/90     Pulse Rate 10/29/20 1224 (!) 102     Resp 10/29/20 1224 20     Temp 10/29/20 1233 97.6 F (36.4 C)     Temp src --      SpO2 10/29/20 1224 96 %     Weight 10/29/20 1224 119.7 kg (264 lb)     Height 10/29/20 1224 1.854 m (6\' 1" )     Head Circumference --      Peak Flow --  Pain Score 10/29/20 1224 7     Pain Loc --      Pain Edu? --      Excl. in [place]? --     Constitutional: Alert and oriented.   Nose: No congestion/rhinnorhea. Mouth/Throat: Mucous membranes are moist.    Cardiovascular: Normal rate, regular rhythm. Grossly normal heart sounds.  Good peripheral circulation. Respiratory: Normal respiratory effort.  No retractions. Lungs CTAB. Gastrointestinal: Soft and nontender. No distention.  No CVA tenderness.  Overall reassuring exam  Musculoskeletal: No lower extremity tenderness nor edema.  Warm and well perfused Neurologic:  Normal speech and language. No gross focal neurologic deficits are appreciated.  Skin:  Skin is warm, dry and intact. No rash noted. Psychiatric: Mood and affect are normal. Speech and behavior are normal.  ____________________________________________   LABS (all labs ordered are listed, but only abnormal results are displayed)  Labs Reviewed  COMPREHENSIVE METABOLIC PANEL - Abnormal; Notable for the following components:      Result Value   Chloride 113 (*)    CO2 14 (*)    Glucose, Bld 133 (*)    BUN 27 (*)    Creatinine, Ser 1.69 (*)    ALT 67 (*)    Total Bilirubin 1.4 (*)    GFR, Estimated 45 (*)    All  other components within normal limits  CBC - Abnormal; Notable for the following components:   WBC 16.8 (*)    All other components within normal limits  URINALYSIS, COMPLETE (UACMP) WITH MICROSCOPIC - Abnormal; Notable for the following components:   Color, Urine YELLOW (*)    APPearance HAZY (*)    Hgb urine dipstick SMALL (*)    All other components within normal limits  LIPASE, BLOOD   ____________________________________________  EKG  ED ECG REPORT I, Lavonia Drafts, the attending physician, personally viewed and interpreted this ECG.  Date: 10/29/2020  Rhythm: normal sinus rhythm QRS Axis: normal Intervals: normal ST/T Wave abnormalities: Nonspecific change Narrative Interpretation: no evidence of acute ischemia  ____________________________________________  RADIOLOGY  CT abdomen pelvis reviewed by me, no acute abnormality ____________________________________________   PROCEDURES  Procedure(s) performed: No  Procedures   Critical Care performed: No ____________________________________________   INITIAL IMPRESSION / ASSESSMENT AND PLAN / ED COURSE  Pertinent labs & imaging results that were available during my care of the patient were reviewed by me and considered in my medical decision making (see chart for details).  Patient presents with nausea vomiting diarrhea as noted above, still feeling queasy.  Some abdominal cramping as detailed above.  Overall reassuring abdominal exam.  Vitals are notable only for very mild tachycardia  Lab work demonstrates elevated BUN/creatinine ratio which is mild.  Elevated white blood cell count as well, this could be related to a viral gastroenteritis.  We will give IV fluids, IV Zofran, IV morphine and reevaluate.  Patient just had CT scan performed 2 weeks ago at [facility]  Patient feeling better after treatments however still anxious about the pain that he has had, will send for CT abdomen pelvis  CT scan is reassuring,  patient's vitals are normal.  Patient is feeling improved, appropriate discharge at this time with strict return precautions, patient agrees with plan    ____________________________________________   FINAL CLINICAL IMPRESSION(S) / ED DIAGNOSES  Final diagnoses:  Gastroenteritis        Note:  This document was prepared using Dragon voice recognition software and may include unintentional dictation errors.   Lavonia Drafts, MD 10/29/20  1914  

## 2020-10-31 DIAGNOSIS — D121 Benign neoplasm of appendix: Secondary | ICD-10-CM | POA: Diagnosis not present

## 2020-11-02 ENCOUNTER — Telehealth: Payer: Self-pay

## 2020-11-02 NOTE — Telephone Encounter (Signed)
Spoke to pt about recent ER visit for nausea. He said he is doing a little better. He is scheduled for next Friday (5-27) to have his appendix removed.

## 2020-11-02 NOTE — Telephone Encounter (Signed)
Faythe Ghee Glad to hear he is better and still on target for the surgery

## 2020-11-03 DIAGNOSIS — Z944 Liver transplant status: Secondary | ICD-10-CM | POA: Diagnosis not present

## 2020-11-03 DIAGNOSIS — M1A9XX1 Chronic gout, unspecified, with tophus (tophi): Secondary | ICD-10-CM | POA: Diagnosis not present

## 2020-11-03 DIAGNOSIS — R197 Diarrhea, unspecified: Secondary | ICD-10-CM | POA: Diagnosis not present

## 2020-11-03 DIAGNOSIS — N183 Chronic kidney disease, stage 3 unspecified: Secondary | ICD-10-CM | POA: Diagnosis not present

## 2020-11-03 DIAGNOSIS — D849 Immunodeficiency, unspecified: Secondary | ICD-10-CM | POA: Diagnosis not present

## 2020-11-03 DIAGNOSIS — D121 Benign neoplasm of appendix: Secondary | ICD-10-CM | POA: Diagnosis not present

## 2020-11-03 DIAGNOSIS — Z01818 Encounter for other preprocedural examination: Secondary | ICD-10-CM | POA: Diagnosis not present

## 2020-11-03 DIAGNOSIS — I10 Essential (primary) hypertension: Secondary | ICD-10-CM | POA: Diagnosis not present

## 2020-11-03 DIAGNOSIS — N200 Calculus of kidney: Secondary | ICD-10-CM | POA: Diagnosis not present

## 2020-11-06 ENCOUNTER — Other Ambulatory Visit: Payer: Self-pay | Admitting: Internal Medicine

## 2020-11-06 DIAGNOSIS — I10 Essential (primary) hypertension: Secondary | ICD-10-CM

## 2020-11-06 DIAGNOSIS — M1A9XX1 Chronic gout, unspecified, with tophus (tophi): Secondary | ICD-10-CM

## 2020-11-08 DIAGNOSIS — D121 Benign neoplasm of appendix: Secondary | ICD-10-CM | POA: Diagnosis not present

## 2020-11-08 DIAGNOSIS — Z20822 Contact with and (suspected) exposure to covid-19: Secondary | ICD-10-CM | POA: Diagnosis not present

## 2020-11-10 ENCOUNTER — Telehealth: Payer: Self-pay | Admitting: Internal Medicine

## 2020-11-10 DIAGNOSIS — N183 Chronic kidney disease, stage 3 unspecified: Secondary | ICD-10-CM | POA: Diagnosis not present

## 2020-11-10 DIAGNOSIS — E785 Hyperlipidemia, unspecified: Secondary | ICD-10-CM | POA: Diagnosis not present

## 2020-11-10 DIAGNOSIS — N200 Calculus of kidney: Secondary | ICD-10-CM | POA: Diagnosis not present

## 2020-11-10 DIAGNOSIS — K219 Gastro-esophageal reflux disease without esophagitis: Secondary | ICD-10-CM | POA: Diagnosis not present

## 2020-11-10 DIAGNOSIS — Z944 Liver transplant status: Secondary | ICD-10-CM | POA: Diagnosis not present

## 2020-11-10 DIAGNOSIS — Z87440 Personal history of urinary (tract) infections: Secondary | ICD-10-CM | POA: Diagnosis not present

## 2020-11-10 DIAGNOSIS — D121 Benign neoplasm of appendix: Secondary | ICD-10-CM | POA: Diagnosis not present

## 2020-11-10 DIAGNOSIS — K388 Other specified diseases of appendix: Secondary | ICD-10-CM | POA: Diagnosis not present

## 2020-11-10 DIAGNOSIS — K7460 Unspecified cirrhosis of liver: Secondary | ICD-10-CM | POA: Diagnosis not present

## 2020-11-10 DIAGNOSIS — Z791 Long term (current) use of non-steroidal anti-inflammatories (NSAID): Secondary | ICD-10-CM | POA: Diagnosis not present

## 2020-11-10 DIAGNOSIS — K7581 Nonalcoholic steatohepatitis (NASH): Secondary | ICD-10-CM | POA: Diagnosis not present

## 2020-11-10 DIAGNOSIS — I129 Hypertensive chronic kidney disease with stage 1 through stage 4 chronic kidney disease, or unspecified chronic kidney disease: Secondary | ICD-10-CM | POA: Diagnosis not present

## 2020-11-10 DIAGNOSIS — Z79899 Other long term (current) drug therapy: Secondary | ICD-10-CM | POA: Diagnosis not present

## 2020-11-10 DIAGNOSIS — Z7952 Long term (current) use of systemic steroids: Secondary | ICD-10-CM | POA: Diagnosis not present

## 2020-11-10 DIAGNOSIS — K429 Umbilical hernia without obstruction or gangrene: Secondary | ICD-10-CM | POA: Diagnosis not present

## 2020-11-10 DIAGNOSIS — E1122 Type 2 diabetes mellitus with diabetic chronic kidney disease: Secondary | ICD-10-CM | POA: Diagnosis not present

## 2020-11-10 DIAGNOSIS — M109 Gout, unspecified: Secondary | ICD-10-CM | POA: Diagnosis not present

## 2020-11-10 DIAGNOSIS — D84821 Immunodeficiency due to drugs: Secondary | ICD-10-CM | POA: Diagnosis not present

## 2020-11-10 DIAGNOSIS — E0781 Sick-euthyroid syndrome: Secondary | ICD-10-CM | POA: Diagnosis not present

## 2020-11-10 DIAGNOSIS — R338 Other retention of urine: Secondary | ICD-10-CM | POA: Diagnosis not present

## 2020-11-10 DIAGNOSIS — N179 Acute kidney failure, unspecified: Secondary | ICD-10-CM | POA: Diagnosis not present

## 2020-11-10 NOTE — Chronic Care Management (AMB) (Signed)
  Chronic Care Management   Outreach Note  11/10/2020 Name: AMAR SIPPEL MRN: [id number] DOB: [birth year]  Referred by: Venia Carbon, MD Reason for referral : No chief complaint on file.   An unsuccessful telephone outreach was attempted today. The patient was referred to the pharmacist for assistance with care management and care coordination.   Follow Up Plan:   Tatjana Dellinger Upstream Scheduler

## 2020-11-11 DIAGNOSIS — Z79899 Other long term (current) drug therapy: Secondary | ICD-10-CM | POA: Diagnosis not present

## 2020-11-11 DIAGNOSIS — K7460 Unspecified cirrhosis of liver: Secondary | ICD-10-CM | POA: Diagnosis not present

## 2020-11-11 DIAGNOSIS — R338 Other retention of urine: Secondary | ICD-10-CM | POA: Diagnosis not present

## 2020-11-11 DIAGNOSIS — Z87440 Personal history of urinary (tract) infections: Secondary | ICD-10-CM | POA: Diagnosis not present

## 2020-11-11 DIAGNOSIS — N183 Chronic kidney disease, stage 3 unspecified: Secondary | ICD-10-CM | POA: Diagnosis not present

## 2020-11-11 DIAGNOSIS — N200 Calculus of kidney: Secondary | ICD-10-CM | POA: Diagnosis not present

## 2020-11-11 DIAGNOSIS — K7581 Nonalcoholic steatohepatitis (NASH): Secondary | ICD-10-CM | POA: Diagnosis not present

## 2020-11-11 DIAGNOSIS — D84821 Immunodeficiency due to drugs: Secondary | ICD-10-CM | POA: Diagnosis not present

## 2020-11-11 DIAGNOSIS — I129 Hypertensive chronic kidney disease with stage 1 through stage 4 chronic kidney disease, or unspecified chronic kidney disease: Secondary | ICD-10-CM | POA: Diagnosis not present

## 2020-11-11 DIAGNOSIS — D121 Benign neoplasm of appendix: Secondary | ICD-10-CM | POA: Diagnosis not present

## 2020-11-11 DIAGNOSIS — K388 Other specified diseases of appendix: Secondary | ICD-10-CM | POA: Diagnosis not present

## 2020-11-11 DIAGNOSIS — K219 Gastro-esophageal reflux disease without esophagitis: Secondary | ICD-10-CM | POA: Diagnosis not present

## 2020-11-11 DIAGNOSIS — E785 Hyperlipidemia, unspecified: Secondary | ICD-10-CM | POA: Diagnosis not present

## 2020-11-11 DIAGNOSIS — N179 Acute kidney failure, unspecified: Secondary | ICD-10-CM | POA: Diagnosis not present

## 2020-11-11 DIAGNOSIS — Z944 Liver transplant status: Secondary | ICD-10-CM | POA: Diagnosis not present

## 2020-11-11 DIAGNOSIS — E0781 Sick-euthyroid syndrome: Secondary | ICD-10-CM | POA: Diagnosis not present

## 2020-11-11 DIAGNOSIS — Z7952 Long term (current) use of systemic steroids: Secondary | ICD-10-CM | POA: Diagnosis not present

## 2020-11-11 DIAGNOSIS — M109 Gout, unspecified: Secondary | ICD-10-CM | POA: Diagnosis not present

## 2020-11-11 DIAGNOSIS — D849 Immunodeficiency, unspecified: Secondary | ICD-10-CM | POA: Diagnosis not present

## 2020-11-11 DIAGNOSIS — E1122 Type 2 diabetes mellitus with diabetic chronic kidney disease: Secondary | ICD-10-CM | POA: Diagnosis not present

## 2020-11-11 DIAGNOSIS — Z791 Long term (current) use of non-steroidal anti-inflammatories (NSAID): Secondary | ICD-10-CM | POA: Diagnosis not present

## 2020-11-12 DIAGNOSIS — K7581 Nonalcoholic steatohepatitis (NASH): Secondary | ICD-10-CM | POA: Diagnosis not present

## 2020-11-12 DIAGNOSIS — N200 Calculus of kidney: Secondary | ICD-10-CM | POA: Diagnosis not present

## 2020-11-12 DIAGNOSIS — M109 Gout, unspecified: Secondary | ICD-10-CM | POA: Diagnosis not present

## 2020-11-12 DIAGNOSIS — K219 Gastro-esophageal reflux disease without esophagitis: Secondary | ICD-10-CM | POA: Diagnosis not present

## 2020-11-12 DIAGNOSIS — Z791 Long term (current) use of non-steroidal anti-inflammatories (NSAID): Secondary | ICD-10-CM | POA: Diagnosis not present

## 2020-11-12 DIAGNOSIS — E1122 Type 2 diabetes mellitus with diabetic chronic kidney disease: Secondary | ICD-10-CM | POA: Diagnosis not present

## 2020-11-12 DIAGNOSIS — Z944 Liver transplant status: Secondary | ICD-10-CM | POA: Diagnosis not present

## 2020-11-12 DIAGNOSIS — E785 Hyperlipidemia, unspecified: Secondary | ICD-10-CM | POA: Diagnosis not present

## 2020-11-12 DIAGNOSIS — N183 Chronic kidney disease, stage 3 unspecified: Secondary | ICD-10-CM | POA: Diagnosis not present

## 2020-11-12 DIAGNOSIS — K388 Other specified diseases of appendix: Secondary | ICD-10-CM | POA: Diagnosis not present

## 2020-11-12 DIAGNOSIS — I129 Hypertensive chronic kidney disease with stage 1 through stage 4 chronic kidney disease, or unspecified chronic kidney disease: Secondary | ICD-10-CM | POA: Diagnosis not present

## 2020-11-12 DIAGNOSIS — R338 Other retention of urine: Secondary | ICD-10-CM | POA: Diagnosis not present

## 2020-11-12 DIAGNOSIS — D84821 Immunodeficiency due to drugs: Secondary | ICD-10-CM | POA: Diagnosis not present

## 2020-11-12 DIAGNOSIS — D121 Benign neoplasm of appendix: Secondary | ICD-10-CM | POA: Diagnosis not present

## 2020-11-12 DIAGNOSIS — Z79899 Other long term (current) drug therapy: Secondary | ICD-10-CM | POA: Diagnosis not present

## 2020-11-12 DIAGNOSIS — Z87440 Personal history of urinary (tract) infections: Secondary | ICD-10-CM | POA: Diagnosis not present

## 2020-11-12 DIAGNOSIS — K7460 Unspecified cirrhosis of liver: Secondary | ICD-10-CM | POA: Diagnosis not present

## 2020-11-12 DIAGNOSIS — E0781 Sick-euthyroid syndrome: Secondary | ICD-10-CM | POA: Diagnosis not present

## 2020-11-12 DIAGNOSIS — Z7952 Long term (current) use of systemic steroids: Secondary | ICD-10-CM | POA: Diagnosis not present

## 2020-11-12 DIAGNOSIS — N179 Acute kidney failure, unspecified: Secondary | ICD-10-CM | POA: Diagnosis not present

## 2020-11-14 ENCOUNTER — Ambulatory Visit: Payer: Medicare Other | Admitting: Internal Medicine

## 2020-11-20 DIAGNOSIS — Z944 Liver transplant status: Secondary | ICD-10-CM | POA: Diagnosis not present

## 2020-11-20 DIAGNOSIS — D849 Immunodeficiency, unspecified: Secondary | ICD-10-CM | POA: Diagnosis not present

## 2020-11-21 ENCOUNTER — Emergency Department: Payer: Medicare Other

## 2020-11-21 ENCOUNTER — Other Ambulatory Visit: Payer: Self-pay

## 2020-11-21 ENCOUNTER — Encounter: Payer: Self-pay | Admitting: Emergency Medicine

## 2020-11-21 ENCOUNTER — Emergency Department
Admission: EM | Admit: 2020-11-21 | Discharge: 2020-11-21 | Disposition: A | Discharge status: Home / Self Care | Payer: Medicare Other | Attending: Emergency Medicine | Admitting: Emergency Medicine

## 2020-11-21 DIAGNOSIS — Z79899 Other long term (current) drug therapy: Secondary | ICD-10-CM | POA: Diagnosis not present

## 2020-11-21 DIAGNOSIS — K219 Gastro-esophageal reflux disease without esophagitis: Secondary | ICD-10-CM | POA: Diagnosis not present

## 2020-11-21 DIAGNOSIS — L03311 Cellulitis of abdominal wall: Secondary | ICD-10-CM | POA: Insufficient documentation

## 2020-11-21 DIAGNOSIS — N1832 Chronic kidney disease, stage 3b: Secondary | ICD-10-CM | POA: Insufficient documentation

## 2020-11-21 DIAGNOSIS — I129 Hypertensive chronic kidney disease with stage 1 through stage 4 chronic kidney disease, or unspecified chronic kidney disease: Secondary | ICD-10-CM | POA: Diagnosis not present

## 2020-11-21 DIAGNOSIS — R109 Unspecified abdominal pain: Secondary | ICD-10-CM | POA: Diagnosis present

## 2020-11-21 DIAGNOSIS — R42 Dizziness and giddiness: Secondary | ICD-10-CM | POA: Diagnosis not present

## 2020-11-21 DIAGNOSIS — R079 Chest pain, unspecified: Secondary | ICD-10-CM | POA: Diagnosis not present

## 2020-11-21 DIAGNOSIS — G8918 Other acute postprocedural pain: Secondary | ICD-10-CM | POA: Diagnosis not present

## 2020-11-21 DIAGNOSIS — R1010 Upper abdominal pain, unspecified: Secondary | ICD-10-CM | POA: Diagnosis not present

## 2020-11-21 DIAGNOSIS — N200 Calculus of kidney: Secondary | ICD-10-CM | POA: Diagnosis not present

## 2020-11-21 DIAGNOSIS — I10 Essential (primary) hypertension: Secondary | ICD-10-CM | POA: Diagnosis not present

## 2020-11-21 LAB — CBC
HCT: 39.4 % (ref 39.0–52.0)
Hemoglobin: 13.5 g/dL (ref 13.0–17.0)
MCH: 33.3 pg (ref 26.0–34.0)
MCHC: 34.3 g/dL (ref 30.0–36.0)
MCV: 97 fL (ref 80.0–100.0)
Platelets: 150 10*3/uL (ref 150–400)
RBC: 4.06 MIL/uL — ABNORMAL LOW (ref 4.22–5.81)
RDW: 14 % (ref 11.5–15.5)
WBC: 8 10*3/uL (ref 4.0–10.5)
nRBC: 0 % (ref 0.0–0.2)

## 2020-11-21 LAB — COMPREHENSIVE METABOLIC PANEL
ALT: 45 U/L — ABNORMAL HIGH (ref 0–44)
AST: 27 U/L (ref 15–41)
Albumin: 3.6 g/dL (ref 3.5–5.0)
Alkaline Phosphatase: 45 U/L (ref 38–126)
Anion gap: 14 (ref 5–15)
BUN: 12 mg/dL (ref 8–23)
CO2: 22 mmol/L (ref 22–32)
Calcium: 8.7 mg/dL — ABNORMAL LOW (ref 8.9–10.3)
Chloride: 106 mmol/L (ref 98–111)
Creatinine, Ser: 1.39 mg/dL — ABNORMAL HIGH (ref 0.61–1.24)
GFR, Estimated: 57 mL/min — ABNORMAL LOW (ref >60)
Glucose, Bld: 127 mg/dL — ABNORMAL HIGH (ref 70–99)
Potassium: 3.6 mmol/L (ref 3.5–5.1)
Sodium: 142 mmol/L (ref 135–145)
Total Bilirubin: 1.3 mg/dL — ABNORMAL HIGH (ref 0.3–1.2)
Total Protein: 6.1 g/dL — ABNORMAL LOW (ref 6.5–8.1)

## 2020-11-21 LAB — TROPONIN I (HIGH SENSITIVITY)
Troponin I (High Sensitivity): 18 ng/L — ABNORMAL HIGH (ref <18)
Troponin I (High Sensitivity): 18 ng/L — ABNORMAL HIGH (ref <18)

## 2020-11-21 LAB — LIPASE, BLOOD: Lipase: 25 U/L (ref 11–51)

## 2020-11-21 MED ORDER — HYDROMORPHONE HCL 1 MG/ML IJ SOLN
0.5000 mg | Freq: Once | INTRAMUSCULAR | Status: AC
  Administered 2020-11-21: 0.5 mg via INTRAVENOUS
  Filled 2020-11-21: qty 1

## 2020-11-21 MED ORDER — OXYCODONE HCL 5 MG PO TABS
5.0000 mg | ORAL_TABLET | Freq: Once | ORAL | Status: AC
  Administered 2020-11-21: 5 mg via ORAL
  Filled 2020-11-21: qty 1

## 2020-11-21 MED ORDER — CEPHALEXIN 500 MG PO CAPS: 500.0000 mg | ORAL_CAPSULE | Freq: Two times a day (BID) | ORAL | 0 refills | Status: AC

## 2020-11-21 MED ORDER — ONDANSETRON HCL 4 MG/2ML IJ SOLN
4.0000 mg | Freq: Once | INTRAMUSCULAR | Status: AC
  Administered 2020-11-21: 4 mg via INTRAVENOUS
  Filled 2020-11-21: qty 2

## 2020-11-21 MED ORDER — SODIUM CHLORIDE 0.9 % IV BOLUS
500.0000 mL | Freq: Once | INTRAVENOUS | Status: AC
  Administered 2020-11-21: 500 mL via INTRAVENOUS

## 2020-11-21 MED ORDER — ACETAMINOPHEN 500 MG PO TABS
1000.0000 mg | ORAL_TABLET | Freq: Once | ORAL | Status: AC
  Administered 2020-11-21: 1000 mg via ORAL
  Filled 2020-11-21: qty 2

## 2020-11-21 NOTE — Discharge Instructions (Addendum)
We started you on antibiotics for possible cellulitis of your abdomen.  You should call the surgery team to get a closer follow-up appointment.  Please let your immunosuppressant doctors know that you have been started on antibiotics.  Please return to the ER if you have worsening redness, fevers, worsening pain or any other concerns

## 2020-11-21 NOTE — ED Triage Notes (Signed)
C/O abdominal pain last night, nausea.  Noticed drainage from umbilical incision this morning, also c/o dizziness this morning.  Post Op from Appendectomy from 11/10/2020.

## 2020-11-21 NOTE — ED Provider Notes (Signed)
[facility] Emergency Department Provider Note  ____________________________________________   Event Date/Time   First MD Initiated Contact with Patient 11/21/20 [phone]     (approximate)  I have reviewed the triage vital signs and the nursing notes.   HISTORY  Chief Complaint Abdominal Pain    HPI Devin White is a 65 y.o. male history of liver failure status post liver transplantation on immunosuppressive therapy who comes in for concerns for abdominal pain.  Patient is status post appendectomy on 11/10/2020.  Patient states that last night he started develop some pain in his abdomen that kind of moved around, constant, nothing made it better, nothing made it worse. Ocassionally upper abdomen with burping and discomfort in chest.  Reports it was associated with some belching.  Then this morning he noted that he had some drainage coming out of his umbilical incision.  He also stated that he felt dizzy and had 2 episodes of watery diarrhea patient reports he has been compliant with all of his medications.  Denies any vomiting          Past Medical History:  Diagnosis Date  . Arthritis    back and legs  . Benign prostatic hypertrophy   . GERD (gastroesophageal reflux disease)   . Gout   . History of blood transfusion    pt has antibodies in his blood since previous transfusions  . History of cirrhosis of liver S/P TRANSPLANT 2009  . History of liver failure S/P TRANSPLANT  . Hypertension   . Left ureteral calculus     Patient Active Problem List   Diagnosis Date Noted  . Gastroenteritis 09/25/2020  . Achilles tendon mass 09/25/2020  . Hypertensive urgency 09/22/2020  . Joint pain 09/22/2020  . Recurrent UTI 03/26/2019  . Cystitis 12/01/2018  . Pulmonary nodule 08/04/2018  . Liver transplant status ([place]) 10/10/2017  . Advance directive discussed with patient 10/10/2017  . Stage 3b chronic kidney disease ([place]) 10/09/2016  . Mallory-Weiss  tear 04/16/2016  . Long-term use of immunosuppressant medication 04/08/2016  . Sleep apnea 10/06/2015  . 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 Immunosuppression ([place]) 06/01/2012  . Hypertension   . Routine general medical examination at a health care facility 04/26/2011  . Chronic liver failure ([place]) 09/24/2010  . Chronic tophaceous gout 12/21/2008  . BPH without urinary obstruction 12/21/2008  . ALLERGIC RHINITIS 05/22/2007  . GERD 05/22/2007  . HIATAL HERNIA 05/22/2007  . IRRITABLE BOWEL SYNDROME, HX OF 05/22/2007  . RENAL CALCULUS, HX OF 05/22/2007    Past Surgical History:  Procedure Laterality Date  . APPENDECTOMY    . bone morrow biopsy    . CHOLECYSTECTOMY  2007  . CYSTO/ LEFT RETROGRADE PYELOGRAM/ LEFT URETERAL STENT PLACEMENT  03-05-2012  DR Tresa Moore [facility])   LEFT URETERAL CALCULI  . CYSTOSCOPY W/ URETERAL STENT PLACEMENT  03/11/2012   Procedure: CYSTOSCOPY WITH STENT REPLACEMENT;  Surgeon: Alexis Frock, MD;  Location: [facility];  Service: Urology;  Laterality: Left;  . HERNIA REPAIR  2008  . LIVER BIOPSY    . LIVER TRANSPLANT  11/24/2007   pt states doing well since liver transplant  . LUMBAR DISC SURGERY    . LUMBAR FUSION    . removal of fistula    . URETEROSCOPY  03/11/2012   Procedure: URETEROSCOPY;  Surgeon: Alexis Frock, MD;  Location: [facility];  Service: Urology;  Laterality: Left;   STONE MANIPULATION, stone obtained  [phone]  [employer] MCR    Prior to Admission medications   Medication Sig Start Date End Date Taking? Authorizing Provider  allopurinol (ZYLOPRIM) 100 MG tablet Take 1 tablet by mouth daily. 10/04/20 10/04/21  [provider]  amLODipine (NORVASC) 10 MG tablet Take 1 tablet (10 mg total) by mouth daily. 09/25/20   Geradine Girt, DO  calcium carbonate (TUMS) 500 MG chewable tablet Chew 1 tablet (200 mg of elemental calcium total) by mouth 2 (two) times daily as needed  for indigestion or heartburn. 06/26/16   Tonia Ghent, MD  colchicine 0.6 MG tablet Take 1 tablet by mouth daily. 10/04/20   [provider]  diclofenac Sodium (VOLTAREN) 1 % GEL Apply topically. 10/03/20 10/03/21  [provider]  hydrALAZINE (APRESOLINE) 25 MG tablet Take 1 tablet by mouth daily. 10/03/20 10/11/21  [provider]  lisinopril (ZESTRIL) 5 MG tablet Take 1 tablet by mouth daily. Patient not taking: Reported on 10/20/2020 10/04/20 10/04/21  [provider]  mycophenolate (CELLCEPT) 250 MG capsule Take 250-500 mg by mouth 2 (two) times daily. 2 capsules (500 mg) in the morning and one capsule (250 mg) in the evening 02/04/19   [provider]  omeprazole (PRILOSEC) 20 MG capsule TAKE 1 CAPSULE BY MOUTH TWICE DAILY 02/28/20   Venia Carbon, MD  ondansetron (ZOFRAN ODT) 4 MG disintegrating tablet Take 1 tablet (4 mg total) by mouth every 8 (eight) hours as needed. 10/29/20   Lavonia Drafts, MD  predniSONE (DELTASONE) 5 MG tablet Take 5 mg by mouth daily with breakfast.    [provider]  tacrolimus (PROGRAF) 0.5 MG capsule Take 0.5-1 mg by mouth See admin instructions. Take 2 capsules (1mg ) by mouth every morning and 1 capsule (0.5mg ) by mouth every night    [provider]  tamsulosin (FLOMAX) 0.4 MG CAPS capsule Take 1 capsule (0.4 mg total) by mouth daily. 06/23/20   Venia Carbon, MD    Allergies Patient has no known allergies.  Family History  Problem Relation Age of Onset  . Cancer Mother        colon  . Arthritis Mother   . Vasculitis Mother   . Hypertension Mother   . Cancer Father        colon  . Kidney disease Father   . Arthritis Father   . Arthritis Brother   . Alcohol abuse Maternal Aunt   . Diabetes Maternal Aunt   . Alcohol abuse Maternal Uncle   . Diabetes Paternal Aunt   . Alcohol abuse Maternal Uncle   . Alcohol abuse Maternal Uncle     Social History Social History   Tobacco Use  .  Smoking status: Never Smoker  . Smokeless tobacco: Never Used  Substance Use Topics  . Alcohol use: No  . Drug use: No      Review of Systems Constitutional: No fever/chills Eyes: No visual changes. ENT: No sore throat. Cardiovascular: + chest discomfort  Respiratory: Denies shortness of breath. Gastrointestinal: Positive abdominal pain, diarrhea Genitourinary: Negative for dysuria. Musculoskeletal: Negative for back pain. Skin: Negative for rash. Neurological: Negative for headaches, focal weakness or numbness. All other ROS negative ____________________________________________   PHYSICAL EXAM:  VITAL SIGNS: ED Triage Vitals  Enc Vitals Group     BP 11/21/20 1004 (!) 167/106     Pulse Rate 11/21/20 1001 78     Resp 11/21/20 1001 18     Temp 11/21/20 1004 98.8 F (37.1 C)  Temp Source 11/21/20 1004 Oral     SpO2 11/21/20 1001 97 %     Weight 11/21/20 0958 264 lb (119.7 kg)     Height 11/21/20 0958 6\' 1"  (1.854 m)     Head Circumference --      Peak Flow --      Pain Score 11/21/20 0958 7     Pain Loc --      Pain Edu? --      Excl. in [place]? --     Constitutional: Alert and oriented. Well appearing and in no acute distress. Eyes: Conjunctivae are normal. EOMI. Head: Atraumatic. Nose: No congestion/rhinnorhea. Mouth/Throat: Mucous membranes are moist.   Neck: No stridor. Trachea Midline. FROM Cardiovascular: Normal rate, regular rhythm. Grossly normal heart sounds.  Good peripheral circulation. Respiratory: Normal respiratory effort.  No retractions. Lungs CTAB. Gastrointestinal: Well-healed large surgical incision from liver transplant.  Glue noted on the umbilicus from recent appendix surgery.  No active drainage at this time no distention.  Some mild blanchable erythema noted around the umbilicus.  Picture noted in media section.  Musculoskeletal: No lower extremity tenderness nor edema.  No joint effusions. Neurologic:  Normal speech and language. No gross  focal neurologic deficits are appreciated.  Skin:  Skin is warm, dry and intact. No rash noted. Psychiatric: Mood and affect are normal. Speech and behavior are normal. GU: Deferred   ____________________________________________   LABS (all labs ordered are listed, but only abnormal results are displayed)  Labs Reviewed  COMPREHENSIVE METABOLIC PANEL - Abnormal; Notable for the following components:      Result Value   Glucose, Bld 127 (*)    Creatinine, Ser 1.39 (*)    Calcium 8.7 (*)    Total Protein 6.1 (*)    ALT 45 (*)    Total Bilirubin 1.3 (*)    GFR, Estimated 57 (*)    All other components within normal limits  CBC - Abnormal; Notable for the following components:   RBC 4.06 (*)    All other components within normal limits  TROPONIN I (HIGH SENSITIVITY) - Abnormal; Notable for the following components:   Troponin I (High Sensitivity) 18 (*)    All other components within normal limits  LIPASE, BLOOD  URINALYSIS, COMPLETE (UACMP) WITH MICROSCOPIC  TROPONIN I (HIGH SENSITIVITY)   ____________________________________________   ED ECG REPORT I, Vanessa Yah-ta-hey, the attending physician, personally viewed and interpreted this ECG.  Normal sinus rate of 76, no ST elevation, T wave version in lead III, normal intervals ____________________________________________  RADIOLOGY  Official radiology report(s): CT ABDOMEN PELVIS WO CONTRAST  Result Date: 11/21/2020 CLINICAL DATA:  Abdominal distention EXAM: CT ABDOMEN AND PELVIS WITHOUT CONTRAST TECHNIQUE: Multidetector CT imaging of the abdomen and pelvis was performed following the standard protocol without IV contrast. COMPARISON:  10/29/2020 FINDINGS: Lower chest: No acute findings Hepatobiliary: Prior cholecystectomy.  No focal hepatic abnormality. Pancreas: No focal abnormality or ductal dilatation. Spleen: Spleen is borderline enlarged at 13 cm, stable since prior study. No focal abnormality. Adrenals/Urinary Tract: Small  bilateral nonobstructing renal stones, 1-2 mm. No ureteral stones or hydronephrosis. Adrenal glands unremarkable. Low-density lesion in the upper pole of the left kidney measures 2.6 cm. Similarly sized low-density lesion in the midpole of the right kidney. These are difficult to characterize on this noncontrast study. Urinary bladder unremarkable. Stomach/Bowel: Normal appendix. Stomach, large and small bowel grossly unremarkable. Vascular/Lymphatic: No evidence of aneurysm or adenopathy. Reproductive: No visible focal abnormality. Other: No free fluid  or free air. Musculoskeletal: Postoperative changes in the lower lumbar spine. No acute bony abnormality. IMPRESSION: Punctate bilateral nephrolithiasis. No acute findings in the abdomen or pelvis. Electronically Signed   By: Rolm Baptise M.D.   On: 11/21/2020 10:49    ____________________________________________   PROCEDURES  Procedure(s) performed (including Critical Care):  .1-3 Lead EKG Interpretation Performed by: Vanessa Kaka, MD Authorized by: Vanessa Monroe, MD     Interpretation: normal     ECG rate:  60s    ECG rate assessment: normal     Rhythm: sinus rhythm     Ectopy: none     Conduction: normal       ____________________________________________   INITIAL IMPRESSION / ASSESSMENT AND PLAN / ED COURSE  Devin White was evaluated in Emergency Department on 11/21/2020 for the symptoms described in the history of present illness. He was evaluated in the context of the global COVID-19 pandemic, which necessitated consideration that the patient might be at risk for infection with the SARS-CoV-2 virus that causes COVID-19. Institutional protocols and algorithms that pertain to the evaluation of patients at risk for COVID-19 are in a state of rapid change based on information released by regulatory bodies including the CDC and federal and state organizations. These policies and algorithms were followed during the patient's care in  the ED.    Patient comes in with abdominal pain.  Will get labs to evaluate for ACS, Electra abnormalities, AKI.  Will get CT imaging to evaluate for postop complications such as perforation, seroma. Could be constipation. Does appear to maybe have a mild cellulitis vs contact irritation given he had some tape on it but he states daughter said it was red this morning when nothing was on it.    Will give 1 dose of IV Dilaudid, IV Zofran and some fluids to help with pain and dizziness  Reevaluated patient sounds like he is actually had some of the diarrhea prior to the surgery and they stated that they were not sure if it was any get better after the surgery.  However I am more concerned about the slight erythema noted on his umbilicus if this could be like an evolving cellulitis plan to be especially careful given patient is immunosuppressed.  He then starts to have some right shoulder pain that happens when he lifts up his arm.  We will continue to trend out cardiac markers to make sure no evidence of ACS.  Chest x-ray was negative and cardiac markers are stable and only slightly elevated most likely secondary to his baseline CKD.  I reevaluated patient he states that he thinks that his arm was just sore from how he was sitting and that he can lift up his arm without any pain.  He had no pain in his chest with just sitting it was only with movement of the arm therefore low suspicion for ACS given reassuring cardiac markers.   I did discuss the case with [facility]'s surgeon Dr. Hester Mates who is okay with starting patient on some Keflex and him following up in their clinic.  He did not think that patient needed to be transferred to [facility].  I think that this is reasonable given patient is hemodynamically stable pain is well controlled.  Although I did discuss with patient that given his any immunosuppressed state that he could have get sick fast that if the redness is spreading develops fevers, worsening symptoms or  any other concerns he needs to return to the ER  immediately.  He is also to follow-up with his transplant team and let them know that he is been started on antibiotics.    ____________________________________________   FINAL CLINICAL IMPRESSION(S) / ED DIAGNOSES   Final diagnoses:  Post-op pain  Cellulitis of abdominal wall      MEDICATIONS GIVEN DURING THIS VISIT:  Medications  ondansetron (ZOFRAN) injection 4 mg (4 mg Intravenous Given 11/21/20 1052)  HYDROmorphone (DILAUDID) injection 0.5 mg (0.5 mg Intravenous Given 11/21/20 1052)  sodium chloride 0.9 % bolus 500 mL (0 mLs Intravenous Stopped 11/21/20 1120)  acetaminophen (TYLENOL) tablet 1,000 mg (1,000 mg Oral Given 11/21/20 1226)  oxyCODONE (Oxy IR/ROXICODONE) immediate release tablet 5 mg (5 mg Oral Given 11/21/20 1226)     ED Discharge Orders         Ordered    cephALEXin (KEFLEX) 500 MG capsule  2 times daily        11/21/20 1438           Note:  This document was prepared using Dragon voice recognition software and may include unintentional dictation errors.   Vanessa [place], MD 11/21/20 1438

## 2020-11-25 DIAGNOSIS — Z87440 Personal history of urinary (tract) infections: Secondary | ICD-10-CM | POA: Diagnosis not present

## 2020-11-25 DIAGNOSIS — M1A0711 Idiopathic chronic gout, right ankle and foot, with tophus (tophi): Secondary | ICD-10-CM | POA: Diagnosis not present

## 2020-11-25 DIAGNOSIS — K219 Gastro-esophageal reflux disease without esophagitis: Secondary | ICD-10-CM | POA: Diagnosis not present

## 2020-11-25 DIAGNOSIS — D121 Benign neoplasm of appendix: Secondary | ICD-10-CM | POA: Diagnosis not present

## 2020-11-25 DIAGNOSIS — Z79899 Other long term (current) drug therapy: Secondary | ICD-10-CM | POA: Diagnosis not present

## 2020-11-25 DIAGNOSIS — Z9049 Acquired absence of other specified parts of digestive tract: Secondary | ICD-10-CM | POA: Diagnosis not present

## 2020-11-25 DIAGNOSIS — N183 Chronic kidney disease, stage 3 unspecified: Secondary | ICD-10-CM | POA: Diagnosis not present

## 2020-11-25 DIAGNOSIS — R110 Nausea: Secondary | ICD-10-CM | POA: Diagnosis not present

## 2020-11-25 DIAGNOSIS — D849 Immunodeficiency, unspecified: Secondary | ICD-10-CM | POA: Diagnosis not present

## 2020-11-25 DIAGNOSIS — Z944 Liver transplant status: Secondary | ICD-10-CM | POA: Diagnosis not present

## 2020-11-25 DIAGNOSIS — R1011 Right upper quadrant pain: Secondary | ICD-10-CM | POA: Diagnosis not present

## 2020-11-25 DIAGNOSIS — M1A9XX1 Chronic gout, unspecified, with tophus (tophi): Secondary | ICD-10-CM | POA: Diagnosis not present

## 2020-11-25 DIAGNOSIS — Q402 Other specified congenital malformations of stomach: Secondary | ICD-10-CM | POA: Diagnosis not present

## 2020-11-25 DIAGNOSIS — R140 Abdominal distension (gaseous): Secondary | ICD-10-CM | POA: Diagnosis not present

## 2020-11-25 DIAGNOSIS — K55019 Acute (reversible) ischemia of small intestine, extent unspecified: Secondary | ICD-10-CM | POA: Diagnosis not present

## 2020-11-25 DIAGNOSIS — I517 Cardiomegaly: Secondary | ICD-10-CM | POA: Diagnosis not present

## 2020-11-25 DIAGNOSIS — K449 Diaphragmatic hernia without obstruction or gangrene: Secondary | ICD-10-CM | POA: Diagnosis not present

## 2020-11-25 DIAGNOSIS — Z87442 Personal history of urinary calculi: Secondary | ICD-10-CM | POA: Diagnosis not present

## 2020-11-25 DIAGNOSIS — K31A0 Gastric intestinal metaplasia, unspecified: Secondary | ICD-10-CM | POA: Diagnosis not present

## 2020-11-25 DIAGNOSIS — R197 Diarrhea, unspecified: Secondary | ICD-10-CM | POA: Diagnosis not present

## 2020-11-25 DIAGNOSIS — R109 Unspecified abdominal pain: Secondary | ICD-10-CM | POA: Diagnosis not present

## 2020-11-25 DIAGNOSIS — R1032 Left lower quadrant pain: Secondary | ICD-10-CM | POA: Diagnosis not present

## 2020-11-25 DIAGNOSIS — K31A11 Gastric intestinal metaplasia without dysplasia, involving the antrum: Secondary | ICD-10-CM | POA: Diagnosis not present

## 2020-11-25 DIAGNOSIS — I129 Hypertensive chronic kidney disease with stage 1 through stage 4 chronic kidney disease, or unspecified chronic kidney disease: Secondary | ICD-10-CM | POA: Diagnosis not present

## 2020-11-25 DIAGNOSIS — I82210 Acute embolism and thrombosis of superior vena cava: Secondary | ICD-10-CM | POA: Diagnosis not present

## 2020-11-25 DIAGNOSIS — K317 Polyp of stomach and duodenum: Secondary | ICD-10-CM | POA: Diagnosis not present

## 2020-11-25 DIAGNOSIS — K559 Vascular disorder of intestine, unspecified: Secondary | ICD-10-CM | POA: Diagnosis not present

## 2020-11-25 DIAGNOSIS — I87009 Postthrombotic syndrome without complications of unspecified extremity: Secondary | ICD-10-CM | POA: Diagnosis not present

## 2020-11-25 DIAGNOSIS — J9811 Atelectasis: Secondary | ICD-10-CM | POA: Diagnosis not present

## 2020-11-25 DIAGNOSIS — R1310 Dysphagia, unspecified: Secondary | ICD-10-CM | POA: Diagnosis not present

## 2020-11-25 DIAGNOSIS — Z7952 Long term (current) use of systemic steroids: Secondary | ICD-10-CM | POA: Diagnosis not present

## 2020-11-25 DIAGNOSIS — K529 Noninfective gastroenteritis and colitis, unspecified: Secondary | ICD-10-CM | POA: Diagnosis not present

## 2020-11-25 DIAGNOSIS — Z20822 Contact with and (suspected) exposure to covid-19: Secondary | ICD-10-CM | POA: Diagnosis not present

## 2020-11-25 DIAGNOSIS — K2289 Other specified disease of esophagus: Secondary | ICD-10-CM | POA: Diagnosis not present

## 2020-11-25 DIAGNOSIS — I2510 Atherosclerotic heart disease of native coronary artery without angina pectoris: Secondary | ICD-10-CM | POA: Diagnosis not present

## 2020-11-25 DIAGNOSIS — K3189 Other diseases of stomach and duodenum: Secondary | ICD-10-CM | POA: Diagnosis not present

## 2020-11-25 DIAGNOSIS — R634 Abnormal weight loss: Secondary | ICD-10-CM | POA: Diagnosis not present

## 2020-11-25 DIAGNOSIS — K55069 Acute infarction of intestine, part and extent unspecified: Secondary | ICD-10-CM | POA: Diagnosis not present

## 2020-11-25 DIAGNOSIS — K319 Disease of stomach and duodenum, unspecified: Secondary | ICD-10-CM | POA: Diagnosis not present

## 2020-11-25 DIAGNOSIS — R079 Chest pain, unspecified: Secondary | ICD-10-CM | POA: Diagnosis not present

## 2020-11-25 DIAGNOSIS — R1012 Left upper quadrant pain: Secondary | ICD-10-CM | POA: Diagnosis not present

## 2020-11-25 DIAGNOSIS — I4581 Long QT syndrome: Secondary | ICD-10-CM | POA: Diagnosis not present

## 2020-11-25 DIAGNOSIS — I10 Essential (primary) hypertension: Secondary | ICD-10-CM | POA: Diagnosis not present

## 2020-11-26 DIAGNOSIS — I82210 Acute embolism and thrombosis of superior vena cava: Secondary | ICD-10-CM | POA: Diagnosis not present

## 2020-11-26 DIAGNOSIS — R1012 Left upper quadrant pain: Secondary | ICD-10-CM | POA: Diagnosis not present

## 2020-11-26 DIAGNOSIS — R1011 Right upper quadrant pain: Secondary | ICD-10-CM | POA: Diagnosis not present

## 2020-11-26 DIAGNOSIS — I10 Essential (primary) hypertension: Secondary | ICD-10-CM | POA: Diagnosis not present

## 2020-12-01 ENCOUNTER — Telehealth: Payer: Self-pay

## 2020-12-01 NOTE — Telephone Encounter (Signed)
Spoke to pt to see how he was doing after recent surgery. Devin White he is doing a little better. His gout flared up while he was in the hospital. They increased his allopurinol to 200mg  daily and will need it checked when he comes here 6-28. Appreciated the call.

## 2020-12-02 NOTE — Telephone Encounter (Signed)
Yes--I saw the discharge summary. We will plan to repeat his uric acid at his upcoming appt

## 2020-12-06 ENCOUNTER — Emergency Department: Payer: Medicare Other

## 2020-12-06 ENCOUNTER — Telehealth: Payer: Self-pay

## 2020-12-06 ENCOUNTER — Other Ambulatory Visit: Payer: Self-pay

## 2020-12-06 ENCOUNTER — Emergency Department
Admission: EM | Admit: 2020-12-06 | Discharge: 2020-12-06 | Disposition: A | Discharge status: Home / Self Care | Payer: Medicare Other | Attending: Student in an Organized Health Care Education/Training Program | Admitting: Student in an Organized Health Care Education/Training Program

## 2020-12-06 DIAGNOSIS — M79672 Pain in left foot: Secondary | ICD-10-CM | POA: Insufficient documentation

## 2020-12-06 DIAGNOSIS — R519 Headache, unspecified: Secondary | ICD-10-CM | POA: Diagnosis not present

## 2020-12-06 DIAGNOSIS — Z79899 Other long term (current) drug therapy: Secondary | ICD-10-CM | POA: Diagnosis not present

## 2020-12-06 DIAGNOSIS — I129 Hypertensive chronic kidney disease with stage 1 through stage 4 chronic kidney disease, or unspecified chronic kidney disease: Secondary | ICD-10-CM | POA: Insufficient documentation

## 2020-12-06 DIAGNOSIS — M7989 Other specified soft tissue disorders: Secondary | ICD-10-CM | POA: Diagnosis not present

## 2020-12-06 DIAGNOSIS — N1832 Chronic kidney disease, stage 3b: Secondary | ICD-10-CM | POA: Diagnosis not present

## 2020-12-06 DIAGNOSIS — M7732 Calcaneal spur, left foot: Secondary | ICD-10-CM | POA: Diagnosis not present

## 2020-12-06 LAB — CBC
HCT: 43.9 % (ref 39.0–52.0)
Hemoglobin: 15 g/dL (ref 13.0–17.0)
MCH: 33.4 pg (ref 26.0–34.0)
MCHC: 34.2 g/dL (ref 30.0–36.0)
MCV: 97.8 fL (ref 80.0–100.0)
Platelets: 212 10*3/uL (ref 150–400)
RBC: 4.49 MIL/uL (ref 4.22–5.81)
RDW: 14.7 % (ref 11.5–15.5)
WBC: 9.4 10*3/uL (ref 4.0–10.5)
nRBC: 0 % (ref 0.0–0.2)

## 2020-12-06 LAB — BASIC METABOLIC PANEL
Anion gap: 8 (ref 5–15)
BUN: 12 mg/dL (ref 8–23)
CO2: 25 mmol/L (ref 22–32)
Calcium: 9.2 mg/dL (ref 8.9–10.3)
Chloride: 107 mmol/L (ref 98–111)
Creatinine, Ser: 1.76 mg/dL — ABNORMAL HIGH (ref 0.61–1.24)
GFR, Estimated: 43 mL/min — ABNORMAL LOW (ref >60)
Glucose, Bld: 156 mg/dL — ABNORMAL HIGH (ref 70–99)
Potassium: 3.9 mmol/L (ref 3.5–5.1)
Sodium: 140 mmol/L (ref 135–145)

## 2020-12-06 MED ORDER — OXYCODONE HCL 5 MG PO TABS: 5.0000 mg | ORAL_TABLET | Freq: Three times a day (TID) | ORAL | 0 refills | Status: DC | PRN

## 2020-12-06 MED ORDER — PROCHLORPERAZINE EDISYLATE 10 MG/2ML IJ SOLN
10.0000 mg | Freq: Once | INTRAMUSCULAR | Status: AC
  Administered 2020-12-06: 10 mg via INTRAMUSCULAR
  Filled 2020-12-06: qty 2

## 2020-12-06 NOTE — ED Triage Notes (Signed)
Pt to ED for headache and left foot pain. States was just discharged for blood clots in abdomen and was told to come back to see if clots have moved.  NAD noted. Denies injury

## 2020-12-06 NOTE — Telephone Encounter (Signed)
Pt left v/m that he is presently taking allopurinol 200 mg and pt thinks gout is getting worse and moving to other parts of pts body. I spoke with pt and he said [facility] started the allopurinol 200mg  one wk ago. Pt said uric acid level was elevated and lt back of foot started to hurt; pt said lt knee cap is swollen and rt wrist is swollen and painful for several months but pain and swelling is worse now. Lt foot is swollen and more painful. Pt said it is difficult and painful to walk. There is a knot the size of a quarter on back of heel that is protruding about 1/2 inch and feels warm to touch with redness and pain especially when touches the knot. Pt was started on eliquis while in hospital at [facility] due to blood clot in abdomen. Pt having bad h/as but no  CP or SOB. Pt is going to [facility] ED for eval and testing. Sending note to Dr Silvio Pate and Larene Beach CMA.

## 2020-12-06 NOTE — Discharge Instructions (Addendum)

## 2020-12-06 NOTE — ED Notes (Signed)
Patient to xray.   Patient reports pain and swelling to left foot x several days. Patient also reports a known clot in his appendix that needs to be removed as well.

## 2020-12-06 NOTE — ED Provider Notes (Signed)
[facility] Emergency Department Provider Note    Event Date/Time   First MD Initiated Contact with Patient 12/06/20 1606     (approximate)  I have reviewed the triage vital signs and the nursing notes.   HISTORY  Chief Complaint Headache and Foot Pain    HPI Devin White is a 65 y.o. 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  Has a history of gout and feels like it is consistent with his gout.  Called his PCP who told him to come to the ER due to concern for DVT.  Denies any injury.  No numbness or tingling.  Denies any history of headaches.  No fevers or chills.  Past Medical History:  Diagnosis Date   Arthritis    back and legs   Benign prostatic hypertrophy    GERD (gastroesophageal reflux disease)    Gout    History of blood transfusion    pt has antibodies in his blood since previous transfusions   History of cirrhosis of liver S/P TRANSPLANT 2009   History of liver failure S/P TRANSPLANT   Hypertension    Left ureteral calculus    Family History  Problem Relation Age of Onset   Cancer Mother        colon   Arthritis Mother    Vasculitis Mother    Hypertension Mother    Cancer Father        colon   Kidney disease Father    Arthritis Father    Arthritis Brother    Alcohol abuse Maternal Aunt    Diabetes Maternal Aunt    Alcohol abuse Maternal Uncle    Diabetes Paternal Aunt    Alcohol abuse Maternal Uncle    Alcohol abuse Maternal Uncle    Past Surgical History:  Procedure Laterality Date   APPENDECTOMY     bone morrow biopsy     CHOLECYSTECTOMY  2007   CYSTO/ LEFT RETROGRADE PYELOGRAM/ LEFT URETERAL STENT PLACEMENT  03-05-2012  DR Tresa Moore [facility])   LEFT URETERAL CALCULI   CYSTOSCOPY W/ URETERAL STENT PLACEMENT  03/11/2012   Procedure: CYSTOSCOPY WITH  STENT REPLACEMENT;  Surgeon: Alexis Frock, MD;  Location: [facility];  Service: Urology;  Laterality: Left;   HERNIA REPAIR  2008   LIVER BIOPSY     LIVER TRANSPLANT  11/24/2007   pt states doing well since liver transplant   LUMBAR DISC SURGERY     LUMBAR FUSION     removal of fistula     URETEROSCOPY  03/11/2012   Procedure: URETEROSCOPY;  Surgeon: Alexis Frock, MD;  Location: [facility];  Service: Urology;  Laterality: Left;   STONE MANIPULATION, stone obtained  [facility]   Patient Active Problem List   Diagnosis Date Noted   Gastroenteritis 09/25/2020   Achilles tendon mass 09/25/2020   Hypertensive urgency 09/22/2020   Joint pain 09/22/2020   Recurrent UTI 03/26/2019   Cystitis 12/01/2018   Pulmonary nodule 08/04/2018   Liver transplant status ([place]) 10/10/2017   Advance directive discussed with patient 10/10/2017   Stage 3b chronic kidney disease ([place]) 10/09/2016   Mallory-Weiss tear 04/16/2016   Long-term use of immunosuppressant medication 04/08/2016   Sleep apnea 10/06/2015   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 Authorizing Provider  oxyCODONE (ROXICODONE) 5 MG immediate release tablet Take 1 tablet (5 mg total) by mouth every 8 (eight) hours as needed. 12/06/20 12/06/21 Yes Merlyn Lot, MD  allopurinol (ZYLOPRIM) 100 MG tablet Take 2 tablets by mouth daily. 10/04/20 10/04/21  [provider]   amLODipine (NORVASC) 10 MG tablet Take 1 tablet (10 mg total) by mouth daily. 09/25/20   Geradine Girt, DO  calcium carbonate (TUMS) 500 MG chewable tablet Chew 1 tablet (200 mg of elemental calcium total) by mouth 2 (two) times daily as needed for indigestion or heartburn. 06/26/16   Tonia Ghent, MD  colchicine 0.6 MG tablet Take 1 tablet by mouth daily. 10/04/20   [provider]  diclofenac Sodium (VOLTAREN) 1 % GEL Apply topically. 10/03/20 10/03/21  [provider]  hydrALAZINE (APRESOLINE) 25 MG tablet Take 1 tablet by mouth daily. 10/03/20 10/11/21  [provider]  lisinopril (ZESTRIL) 5 MG tablet Take 1 tablet by mouth daily. Patient not taking: Reported on 10/20/2020 10/04/20 10/04/21  [provider]  mycophenolate (CELLCEPT) 250 MG capsule Take 250-500 mg by mouth 2 (two) times daily. 2 capsules (500 mg) in the morning and one capsule (250 mg) in the evening 02/04/19   [provider]  omeprazole (PRILOSEC) 20 MG capsule TAKE 1 CAPSULE BY MOUTH TWICE DAILY 02/28/20   Venia Carbon, MD  ondansetron (ZOFRAN ODT) 4 MG disintegrating tablet Take 1 tablet (4 mg total) by mouth every 8 (eight) hours as needed. 10/29/20   Lavonia Drafts, MD  predniSONE (DELTASONE) 5 MG tablet Take 5 mg by mouth daily with breakfast.    [provider]  tacrolimus (PROGRAF) 0.5 MG capsule Take 0.5-1 mg by mouth See admin instructions. Take 2 capsules (1mg ) by mouth every morning and 1 capsule (0.5mg ) by mouth every night    [provider]  tamsulosin (FLOMAX) 0.4 MG CAPS capsule Take 1 capsule (0.4 mg total) by mouth daily. 06/23/20   Venia Carbon, MD    Allergies Patient has no known allergies.    Social History Social History   Tobacco Use   Smoking status: Never   Smokeless tobacco: Never  Substance Use Topics   Alcohol use: No   Drug use: No    Review of Systems Patient denies headaches, rhinorrhea, blurry vision, numbness,  shortness of breath, chest pain, edema, cough, abdominal pain, nausea, vomiting, diarrhea, dysuria, fevers, rashes or hallucinations unless otherwise stated above in HPI. ____________________________________________   PHYSICAL EXAM:  VITAL SIGNS: Vitals:   12/06/20 1504  BP: (!) 130/93  Pulse: (!) 102  Resp: 19  Temp: 97.9 F (36.6 C)  SpO2: 96%    Constitutional: Alert and oriented.  Eyes: Conjunctivae are normal.  Head: Atraumatic. Nose: No congestion/rhinnorhea. Mouth/Throat: Mucous membranes are moist.   Neck: No stridor. Painless ROM.  Cardiovascular: Normal rate, regular rhythm. Grossly normal heart sounds.  Good peripheral circulation. Respiratory: Normal respiratory effort.  No retractions. Lungs CTAB. Gastrointestinal: Soft and nontender. No distention. No abdominal  bruits. No CVA tenderness. Genitourinary:  Musculoskeletal: Has gouty tophi to bilateral feet.  No ankle effusion.  No overlying erythema.  No swelling or edema.  Neurovascular intact distally.  No lower extremity tenderness nor edema.  No joint effusions. Neurologic:  Normal speech and language. No gross focal neurologic deficits are appreciated. No facial droop Skin:  Skin is warm, dry and intact. No rash noted. Psychiatric: Mood and affect are normal. Speech and behavior are normal.  ____________________________________________   LABS (all labs ordered are listed, but only abnormal results are displayed)  Results for orders placed or performed during the hospital encounter of 12/06/20 (from the past 24 hour(s))  CBC     Status: None   Collection Time: 12/06/20  3:13 PM  Result Value Ref Range   WBC 9.4 4.0 - 10.5 K/uL   RBC 4.49 4.22 - 5.81 MIL/uL   Hemoglobin 15.0 13.0 - 17.0 g/dL   HCT 43.9 39.0 - 52.0 %   MCV 97.8 80.0 - 100.0 fL   MCH 33.4 26.0 - 34.0 pg   MCHC 34.2 30.0 - 36.0 g/dL   RDW 14.7 11.5 - 15.5 %   Platelets 212 150 - 400 K/uL   nRBC 0.0 0.0 - 0.2 %  Basic metabolic panel      Status: Abnormal   Collection Time: 12/06/20  3:13 PM  Result Value Ref Range   Sodium 140 135 - 145 mmol/L   Potassium 3.9 3.5 - 5.1 mmol/L   Chloride 107 98 - 111 mmol/L   CO2 25 22 - 32 mmol/L   Glucose, Bld 156 (H) 70 - 99 mg/dL   BUN 12 8 - 23 mg/dL   Creatinine, Ser 1.76 (H) 0.61 - 1.24 mg/dL   Calcium 9.2 8.9 - 10.3 mg/dL   GFR, Estimated 43 (L) >60 mL/min   Anion gap 8 5 - 15   ____________________________________________  EKG____________________________________________  RADIOLOGY  I personally reviewed all radiographic images ordered to evaluate for the above acute complaints and reviewed radiology reports and findings.  These findings were personally discussed with the patient.  Please see medical record for radiology report.  ____________________________________________   PROCEDURES  Procedure(s) performed:  Procedures    Critical Care performed: no ____________________________________________   INITIAL IMPRESSION / ASSESSMENT AND PLAN / ED COURSE  Pertinent labs & imaging results that were available during my care of the patient were reviewed by me and considered in my medical decision making (see chart for details).   DDX: Headache, tension, migraine, SAH, IPH, SDH, DVT, gout, fracture  DAVIDJAMES BLANSETT is a 65 y.o. who presents to the ED with presentation as described above.  Patient clinically well-appearing with no focal neurodeficits.  No report of any trauma.  CT imaging ordered for above differential shows no evidence of acute abnormality.  Does not seem consistent with aneurysm or bleed.  Not consistent with meningitis or encephalitis.  Pain improved in the ER.  Ankle pain likely consistent with mild gout.  No signs of septic arthritis or fracture.  He has been compliant with his medication does not have any objective findings to suggest acute DVT.  Do not feel that duplex ultrasound clinically indicated at this time.  Patient feels well and is  appropriate for close outpatient follow-up.     The patient was evaluated in Emergency Department today for the symptoms described in the history of present illness. He/she was evaluated in the context of the global COVID-19 pandemic, which necessitated consideration that  the patient might be at risk for infection with the SARS-CoV-2 virus that causes COVID-19. Institutional protocols and algorithms that pertain to the evaluation of patients at risk for COVID-19 are in a state of rapid change based on information released by regulatory bodies including the CDC and federal and state organizations. These policies and algorithms were followed during the patient's care in the ED.  As part of my medical decision making, I reviewed the following data within the [place] notes reviewed and incorporated, Labs reviewed, notes from prior ED visits and [place] Controlled Substance Database   ____________________________________________   FINAL CLINICAL IMPRESSION(S) / ED DIAGNOSES  Final diagnoses:  Acute nonintractable headache, unspecified headache type  Foot pain, left      NEW MEDICATIONS STARTED DURING THIS VISIT:  New Prescriptions   OXYCODONE (ROXICODONE) 5 MG IMMEDIATE RELEASE TABLET    Take 1 tablet (5 mg total) by mouth every 8 (eight) hours as needed.     Note:  This document was prepared using Dragon voice recognition software and may include unintentional dictation errors.    Merlyn Lot, MD 12/06/20 [phone]

## 2020-12-06 NOTE — ED Notes (Signed)
No imaging at this time until assessed by MD, per Dr Cheri Fowler

## 2020-12-06 NOTE — ED Notes (Signed)
meds given. Pt alert 

## 2020-12-07 NOTE — Telephone Encounter (Signed)
Left detailed message on VM per DPR. 

## 2020-12-07 NOTE — Telephone Encounter (Signed)
Spoke to pt. He said the hospital did not keep him. Did xray of the foot and said it was Gout. Spreading up his leg now. He is taking allopurinol 200mg . He has prednisone 20mg  and 5mg  at home. Just let him know what to take.

## 2020-12-12 ENCOUNTER — Other Ambulatory Visit: Payer: Self-pay

## 2020-12-12 ENCOUNTER — Encounter: Payer: Self-pay | Admitting: Internal Medicine

## 2020-12-12 ENCOUNTER — Ambulatory Visit (INDEPENDENT_AMBULATORY_CARE_PROVIDER_SITE_OTHER): Payer: Medicare Other | Admitting: Internal Medicine

## 2020-12-12 DIAGNOSIS — K529 Noninfective gastroenteritis and colitis, unspecified: Secondary | ICD-10-CM

## 2020-12-12 DIAGNOSIS — K55069 Acute infarction of intestine, part and extent unspecified: Secondary | ICD-10-CM | POA: Insufficient documentation

## 2020-12-12 DIAGNOSIS — M1A9XX1 Chronic gout, unspecified, with tophus (tophi): Secondary | ICD-10-CM

## 2020-12-12 LAB — RENAL FUNCTION PANEL
Albumin: 4.1 g/dL (ref 3.5–5.2)
BUN: 20 mg/dL (ref 6–23)
CO2: 28 mEq/L (ref 19–32)
Calcium: 9 mg/dL (ref 8.4–10.5)
Chloride: 103 mEq/L (ref 96–112)
Creatinine, Ser: 1.47 mg/dL (ref 0.40–1.50)
GFR: 50.65 mL/min — ABNORMAL LOW (ref >60.00)
Glucose, Bld: 161 mg/dL — ABNORMAL HIGH (ref 70–99)
Phosphorus: 4 mg/dL (ref 2.3–4.6)
Potassium: 3.5 mEq/L (ref 3.5–5.1)
Sodium: 141 mEq/L (ref 135–145)

## 2020-12-12 LAB — URIC ACID: Uric Acid, Serum: 5.7 mg/dL (ref 4.0–7.8)

## 2020-12-12 MED ORDER — TAMSULOSIN HCL 0.4 MG PO CAPS: 0.4000 mg | ORAL_CAPSULE | Freq: Every day | ORAL | 3 refills | Status: DC

## 2020-12-12 NOTE — Assessment & Plan Note (Signed)
Now on eliquis Will need this for at least 3-6 months

## 2020-12-12 NOTE — Assessment & Plan Note (Signed)
Better after flagyl Duodenitis found on EGD--going back to enteroscopy now On omeprazole bid now

## 2020-12-12 NOTE — Assessment & Plan Note (Signed)
Quiet now on allopurinol and prednisone flare Will check uric acid again---if not close to 6 now, will increase to 300mg  daily

## 2020-12-12 NOTE — Progress Notes (Signed)
Subjective:    Patient ID: Devin White, male    DOB: 09-09-1955, 65 y.o.   MRN: [id number]  HPI Here for hospital follow up This visit occurred during the SARS-CoV-2 public health emergency.  Safety protocols were in place, including screening questions prior to the visit, additional usage of staff PPE, and extensive cleaning of exam room while observing appropriate contact time as indicated for disinfecting solutions.   Did have appendectomy on 5/28 Stayed 2 days---trouble voiding and needed Foley briefly Then home  Admitted due to N/V/diarrhea 2 weeks ago Found mesenteric thrombosis On eliquis now Had EGD then---duodenitis found. Now planning enteroscopy  On omeprazole bid Brief course of metronidazole--helped nausea/diarrhea. Last day today  Ongoing gout pain Uric acid down to 7.7 after appendectomy Allopurinol increased to 200mg   about 2 weeks ago Then had foot and wrist pain Taking the colchicine daily Prednisone increased to 40mg  daily and now down to 20mg  (and then will go down to 5mg  again)  Current Outpatient Medications on File Prior to Visit  Medication Sig Dispense Refill   allopurinol (ZYLOPRIM) 100 MG tablet Take 2 tablets by mouth daily.     calcium carbonate (TUMS) 500 MG chewable tablet Chew 1 tablet (200 mg of elemental calcium total) by mouth 2 (two) times daily as needed for indigestion or heartburn. 180 tablet 1   colchicine 0.6 MG tablet Take 1 tablet by mouth daily.     diclofenac Sodium (VOLTAREN) 1 % GEL Apply topically.     mycophenolate (CELLCEPT) 250 MG capsule Take 250-500 mg by mouth 2 (two) times daily. 2 capsules (500 mg) in the morning and one capsule (250 mg) in the evening     omeprazole (PRILOSEC) 20 MG capsule TAKE 1 CAPSULE BY MOUTH TWICE DAILY 180 capsule 3   ondansetron (ZOFRAN ODT) 4 MG disintegrating tablet Take 1 tablet (4 mg total) by mouth every 8 (eight) hours as needed. 20 tablet 0   predniSONE (DELTASONE) 5 MG tablet Take 20 mg  by mouth daily with breakfast.     tacrolimus (PROGRAF) 0.5 MG capsule Take 0.5-1 mg by mouth See admin instructions. Take 2 capsules (1mg ) by mouth every morning and 1 capsule (0.5mg ) by mouth every night     tamsulosin (FLOMAX) 0.4 MG CAPS capsule Take 1 capsule (0.4 mg total) by mouth daily. 90 capsule 1   amLODipine (NORVASC) 10 MG tablet Take 1 tablet (10 mg total) by mouth daily. (Patient not taking: Reported on 12/12/2020) 30 tablet 0   ELIQUIS 5 MG TABS tablet Take 5 mg by mouth 2 (two) times daily.     No current facility-administered medications on file prior to visit.    No Known Allergies  Past Medical History:  Diagnosis Date   Arthritis    back and legs   Benign prostatic hypertrophy    GERD (gastroesophageal reflux disease)    Gout    History of blood transfusion    pt has antibodies in his blood since previous transfusions   History of cirrhosis of liver S/P TRANSPLANT 2009   History of liver failure S/P TRANSPLANT   Hypertension    Left ureteral calculus     Past Surgical History:  Procedure Laterality Date   APPENDECTOMY     bone morrow biopsy     CHOLECYSTECTOMY  2007   CYSTO/ LEFT RETROGRADE PYELOGRAM/ LEFT URETERAL STENT PLACEMENT  03-05-2012  DR Tresa Moore [facility])   LEFT URETERAL CALCULI   CYSTOSCOPY W/ URETERAL STENT PLACEMENT  03/11/2012   Procedure: CYSTOSCOPY WITH STENT REPLACEMENT;  Surgeon: Alexis Frock, MD;  Location: [facility];  Service: Urology;  Laterality: Left;   HERNIA REPAIR  2008   LIVER BIOPSY     LIVER TRANSPLANT  11/24/2007   pt states doing well since liver transplant   LUMBAR DISC SURGERY     LUMBAR FUSION     removal of fistula     URETEROSCOPY  03/11/2012   Procedure: URETEROSCOPY;  Surgeon: Alexis Frock, MD;  Location: [facility];  Service: Urology;  Laterality: Left;   STONE MANIPULATION, stone obtained  [place] [facility]    Family History  Problem Relation Age of Onset   Cancer Mother         colon   Arthritis Mother    Vasculitis Mother    Hypertension Mother    Cancer Father        colon   Kidney disease Father    Arthritis Father    Arthritis Brother    Alcohol abuse Maternal Aunt    Diabetes Maternal Aunt    Alcohol abuse Maternal Uncle    Diabetes Paternal Aunt    Alcohol abuse Maternal Uncle    Alcohol abuse Maternal Uncle     Social History   Socioeconomic History   Marital status: Single    Spouse name: Not on file   Number of children: 3   Years of education: Not on file   Highest education level: Not on file  Occupational History   Occupation: disabled    Employer: RETIRED  Tobacco Use   Smoking status: Never   Smokeless tobacco: Never  Substance and Sexual Activity   Alcohol use: No   Drug use: No   Sexual activity: Not on file  Other Topics Concern   Not on file  Social History Narrative   ** Merged History Encounter **       Has living will Requests daughter [name] as his health care POA Would accept brief attempt at resuscitation but no prolonged ventilation No tube feeds if cognitively unaware   Social Determinants of Health   Financial Resource Strain: Not on file  Food Insecurity: Not on file  Transportation Needs: Not on file  Physical Activity: Not on file  Stress: Not on file  Social Connections: Not on file  Intimate Partner Violence: Not on file   Review of Systems Eating fair now Bowels more normal -- 2-3 per day but not watery Sleeps okay     Objective:   Physical Exam Constitutional:      Appearance: Normal appearance.  Cardiovascular:     Rate and Rhythm: Normal rate and regular rhythm.     Heart sounds: No murmur heard.   No gallop.     Comments: Frequent skips Pulmonary:     Effort: Pulmonary effort is normal.     Breath sounds: Normal breath sounds. No wheezing or rales.  Abdominal:     Palpations: Abdomen is soft.     Tenderness: There is no abdominal tenderness.  Musculoskeletal:     Cervical  back: Neck supple.     Comments: Only small nodule on left Achilles No active synovitis in hands/wrists now  Lymphadenopathy:     Cervical: No cervical adenopathy.  Neurological:     Mental Status: He is alert.           Assessment & Plan:

## 2020-12-19 DIAGNOSIS — Z944 Liver transplant status: Secondary | ICD-10-CM | POA: Diagnosis not present

## 2020-12-19 DIAGNOSIS — K754 Autoimmune hepatitis: Secondary | ICD-10-CM | POA: Diagnosis not present

## 2020-12-19 DIAGNOSIS — T8643 Liver transplant infection: Secondary | ICD-10-CM | POA: Diagnosis not present

## 2020-12-19 DIAGNOSIS — K55069 Acute infarction of intestine, part and extent unspecified: Secondary | ICD-10-CM | POA: Diagnosis not present

## 2020-12-19 DIAGNOSIS — R768 Other specified abnormal immunological findings in serum: Secondary | ICD-10-CM | POA: Diagnosis not present

## 2020-12-19 DIAGNOSIS — I10 Essential (primary) hypertension: Secondary | ICD-10-CM | POA: Diagnosis not present

## 2020-12-19 DIAGNOSIS — M1A9XX1 Chronic gout, unspecified, with tophus (tophi): Secondary | ICD-10-CM | POA: Diagnosis not present

## 2020-12-19 DIAGNOSIS — N183 Chronic kidney disease, stage 3 unspecified: Secondary | ICD-10-CM | POA: Diagnosis not present

## 2020-12-26 ENCOUNTER — Encounter: Payer: Self-pay | Admitting: Internal Medicine

## 2020-12-26 DIAGNOSIS — K55059 Acute (reversible) ischemia of intestine, part and extent unspecified: Secondary | ICD-10-CM | POA: Diagnosis not present

## 2020-12-26 DIAGNOSIS — Z944 Liver transplant status: Secondary | ICD-10-CM | POA: Diagnosis not present

## 2020-12-26 DIAGNOSIS — K2289 Other specified disease of esophagus: Secondary | ICD-10-CM | POA: Diagnosis not present

## 2020-12-26 DIAGNOSIS — Z79899 Other long term (current) drug therapy: Secondary | ICD-10-CM | POA: Diagnosis not present

## 2020-12-26 DIAGNOSIS — K7460 Unspecified cirrhosis of liver: Secondary | ICD-10-CM | POA: Diagnosis not present

## 2020-12-26 DIAGNOSIS — K7581 Nonalcoholic steatohepatitis (NASH): Secondary | ICD-10-CM | POA: Diagnosis not present

## 2020-12-26 DIAGNOSIS — K3189 Other diseases of stomach and duodenum: Secondary | ICD-10-CM | POA: Diagnosis not present

## 2020-12-26 DIAGNOSIS — M19079 Primary osteoarthritis, unspecified ankle and foot: Secondary | ICD-10-CM | POA: Diagnosis not present

## 2020-12-26 DIAGNOSIS — Z9049 Acquired absence of other specified parts of digestive tract: Secondary | ICD-10-CM | POA: Diagnosis not present

## 2020-12-26 DIAGNOSIS — M1A9XX1 Chronic gout, unspecified, with tophus (tophi): Secondary | ICD-10-CM | POA: Diagnosis not present

## 2020-12-26 DIAGNOSIS — Z7901 Long term (current) use of anticoagulants: Secondary | ICD-10-CM | POA: Diagnosis not present

## 2020-12-26 DIAGNOSIS — K2100 Gastro-esophageal reflux disease with esophagitis, without bleeding: Secondary | ICD-10-CM | POA: Diagnosis not present

## 2020-12-26 DIAGNOSIS — N183 Chronic kidney disease, stage 3 unspecified: Secondary | ICD-10-CM | POA: Diagnosis not present

## 2020-12-26 DIAGNOSIS — K208 Other esophagitis without bleeding: Secondary | ICD-10-CM | POA: Diagnosis not present

## 2020-12-26 DIAGNOSIS — K31A0 Gastric intestinal metaplasia, unspecified: Secondary | ICD-10-CM | POA: Diagnosis not present

## 2020-12-26 DIAGNOSIS — I129 Hypertensive chronic kidney disease with stage 1 through stage 4 chronic kidney disease, or unspecified chronic kidney disease: Secondary | ICD-10-CM | POA: Diagnosis not present

## 2020-12-26 DIAGNOSIS — Z87442 Personal history of urinary calculi: Secondary | ICD-10-CM | POA: Diagnosis not present

## 2020-12-26 DIAGNOSIS — K229 Disease of esophagus, unspecified: Secondary | ICD-10-CM | POA: Diagnosis not present

## 2020-12-26 DIAGNOSIS — Z7952 Long term (current) use of systemic steroids: Secondary | ICD-10-CM | POA: Diagnosis not present

## 2020-12-26 DIAGNOSIS — K317 Polyp of stomach and duodenum: Secondary | ICD-10-CM | POA: Diagnosis not present

## 2020-12-29 DIAGNOSIS — K55069 Acute infarction of intestine, part and extent unspecified: Secondary | ICD-10-CM | POA: Diagnosis not present

## 2021-01-01 DIAGNOSIS — N1831 Chronic kidney disease, stage 3a: Secondary | ICD-10-CM | POA: Diagnosis not present

## 2021-01-01 DIAGNOSIS — D849 Immunodeficiency, unspecified: Secondary | ICD-10-CM | POA: Diagnosis not present

## 2021-01-01 DIAGNOSIS — K55069 Acute infarction of intestine, part and extent unspecified: Secondary | ICD-10-CM | POA: Diagnosis not present

## 2021-01-01 DIAGNOSIS — R197 Diarrhea, unspecified: Secondary | ICD-10-CM | POA: Diagnosis not present

## 2021-01-01 DIAGNOSIS — K31A0 Gastric intestinal metaplasia, unspecified: Secondary | ICD-10-CM | POA: Insufficient documentation

## 2021-01-01 DIAGNOSIS — T8643 Liver transplant infection: Secondary | ICD-10-CM | POA: Diagnosis not present

## 2021-01-01 DIAGNOSIS — Z944 Liver transplant status: Secondary | ICD-10-CM | POA: Diagnosis not present

## 2021-01-01 DIAGNOSIS — Z7901 Long term (current) use of anticoagulants: Secondary | ICD-10-CM | POA: Diagnosis not present

## 2021-01-01 DIAGNOSIS — D84821 Immunodeficiency due to drugs: Secondary | ICD-10-CM | POA: Diagnosis not present

## 2021-01-01 DIAGNOSIS — Z9049 Acquired absence of other specified parts of digestive tract: Secondary | ICD-10-CM | POA: Diagnosis not present

## 2021-01-01 DIAGNOSIS — Z79899 Other long term (current) drug therapy: Secondary | ICD-10-CM | POA: Diagnosis not present

## 2021-01-01 DIAGNOSIS — K754 Autoimmune hepatitis: Secondary | ICD-10-CM | POA: Diagnosis not present

## 2021-01-08 ENCOUNTER — Telehealth: Payer: Self-pay | Admitting: Internal Medicine

## 2021-01-08 NOTE — Chronic Care Management (AMB) (Signed)
  Chronic Care Management   Outreach Note  01/08/2021 Name: Devin White MRN: [id number] DOB: 01/03/1956  Referred by: Venia Carbon, MD Reason for referral : No chief complaint on file.   A second unsuccessful telephone outreach was attempted today. The patient was referred to pharmacist for assistance with care management and care coordination.  Follow Up Plan:   Tatjana Dellinger Upstream Scheduler

## 2021-01-15 ENCOUNTER — Telehealth: Payer: Self-pay | Admitting: Internal Medicine

## 2021-01-15 NOTE — Chronic Care Management (AMB) (Signed)
  Chronic Care Management   Note  01/15/2021 Name: GIANNIS LOCHER MRN: [id number] DOB: 10-04-1955  NAIN BRASINGTON is a 65 y.o. year old male who is a primary care patient of Silvio Pate, Theophilus Kinds, MD. I reached out to [name] by phone today in response to a referral sent by Mr. Drezden Suffridge Creasman's PCP, Venia Carbon, MD.   Mr. Brigham was given information about Chronic Care Management services today including:  CCM service includes personalized support from designated clinical staff supervised by his physician, including individualized plan of care and coordination with other care providers 24/7 contact phone numbers for assistance for urgent and routine care needs. Service will only be billed when office clinical staff spend 20 minutes or more in a month to coordinate care. Only one practitioner may furnish and bill the service in a calendar month. The patient may stop CCM services at any time (effective at the end of the month) by phone call to the office staff.   Patient agreed to services and verbal consent obtained.   Follow up plan:   Tatjana Secretary/administrator

## 2021-01-22 ENCOUNTER — Other Ambulatory Visit: Payer: Self-pay | Admitting: *Deleted

## 2021-01-22 MED ORDER — ALLOPURINOL 100 MG PO TABS: 200.0000 mg | ORAL_TABLET | Freq: Every day | ORAL | 3 refills | Status: DC

## 2021-01-22 NOTE — Telephone Encounter (Signed)
Rx sent electronically.  

## 2021-01-22 NOTE — Telephone Encounter (Signed)
Patient left a voicemail stating that the directions on his Allopurinol has been changed to two a day. Patient stated that he needs a new script sent to the pharmacy with the new directions because he has only been getting 45 at a time and that would only be for 22 days.  Pharmacy [facility] [address].

## 2021-02-06 ENCOUNTER — Telehealth: Payer: Self-pay

## 2021-02-06 MED ORDER — PREDNISONE 20 MG PO TABS: 40.0000 mg | ORAL_TABLET | Freq: Every day | ORAL | 0 refills | Status: DC

## 2021-02-06 NOTE — Telephone Encounter (Signed)
Pt left v/m requesting prednisone 20 mg for gout flair up; pt has gout flair up on back of lt foot at heel with slight redness but swollen and sore when walking on foot;pt usually takes prednisone 5 mg daily for liver function.Pt said about 1 month ago had last gout flair up. Pt is presently taking colchicine 0.6 mg taking once daily; pt also taking allopurinol 100 mg taking 2 tabs in AM;pt is going out of town at end of week for one wk. [facility] [address]. Pt is requesting prednisone 20 mg. [place]aving 02/10/21.pt request cb after reviewed by Dr Silvio Pate.

## 2021-02-06 NOTE — Telephone Encounter (Signed)
Please let him know I sent a 6 day prednisone course for him.  He can also increase the colchicine to twice a day till it settles down

## 2021-02-07 NOTE — Telephone Encounter (Signed)
Apologized to pt for not calling him yesterday. But the pharmacy let him know.

## 2021-02-22 DIAGNOSIS — D1801 Hemangioma of skin and subcutaneous tissue: Secondary | ICD-10-CM | POA: Diagnosis not present

## 2021-02-22 DIAGNOSIS — L821 Other seborrheic keratosis: Secondary | ICD-10-CM | POA: Diagnosis not present

## 2021-02-22 DIAGNOSIS — Z1283 Encounter for screening for malignant neoplasm of skin: Secondary | ICD-10-CM | POA: Diagnosis not present

## 2021-02-22 DIAGNOSIS — Z944 Liver transplant status: Secondary | ICD-10-CM | POA: Diagnosis not present

## 2021-02-22 DIAGNOSIS — D485 Neoplasm of uncertain behavior of skin: Secondary | ICD-10-CM | POA: Diagnosis not present

## 2021-02-22 DIAGNOSIS — N183 Chronic kidney disease, stage 3 unspecified: Secondary | ICD-10-CM | POA: Diagnosis not present

## 2021-02-22 DIAGNOSIS — D2270 Melanocytic nevi of unspecified lower limb, including hip: Secondary | ICD-10-CM | POA: Diagnosis not present

## 2021-02-22 DIAGNOSIS — D225 Melanocytic nevi of trunk: Secondary | ICD-10-CM | POA: Diagnosis not present

## 2021-02-22 DIAGNOSIS — L578 Other skin changes due to chronic exposure to nonionizing radiation: Secondary | ICD-10-CM | POA: Diagnosis not present

## 2021-02-22 DIAGNOSIS — D2260 Melanocytic nevi of unspecified upper limb, including shoulder: Secondary | ICD-10-CM | POA: Diagnosis not present

## 2021-02-22 DIAGNOSIS — M1A9XX1 Chronic gout, unspecified, with tophus (tophi): Secondary | ICD-10-CM | POA: Diagnosis not present

## 2021-02-22 DIAGNOSIS — L814 Other melanin hyperpigmentation: Secondary | ICD-10-CM | POA: Diagnosis not present

## 2021-02-23 ENCOUNTER — Telehealth: Payer: Self-pay | Admitting: Pharmacist

## 2021-02-23 NOTE — Progress Notes (Signed)
Chronic Care Management Pharmacy Assistant   Name: Devin White  MRN: [id number] DOB: 1955-12-30  Devin White is an 65 y.o. year old male who presents for his initial CCM visit with the clinical pharmacist.  Reason for Encounter: Initial Questions    Recent office visits:  12/12/20 Venia Carbon, MD (PCP) Chronic tophaceous gout   10/20/20 Venia Carbon, MD (PCP) Chronic tophaceous gout  Med change: prednisone 5 mg daily  10/10/20 Venia Carbon, MD (PCP) Chronic tophaceous gout  Med changes: colchicine 0.6 mg daily, prednisone 5 mg  Take 2 tablets (40 mg total) by mouth daily. For 1 week, then '20mg'$  daily for 1 week, then '10mg'$  daily for 1 week., Starting Tue 10/10/2020, Until Fri 10/20/2020, Normal  09/25/20  Venia Carbon, MD (PCP) Gastroenteritis Med change:predniSONE (DELTASONE) 5 MG tablet  Recent consult visits:  01/01/21 Gastroenterology [facility] (Liver replaced by transplant )  12/29/20 [facility] Hematology (Mesenteric vein thrombosis)  Hospital visits:  Medication Reconciliation was completed by comparing discharge summary, patient's EMR and Pharmacy list, and upon discussion with patient.  1.) Admitted to the hospital on 12/06/20 due to headache and foot pain. Discharge date was 12/06/20. Discharged from [facility] ED .  New?Medications Started at [facility] Discharge:?? -started oxycodone 5 mg due to pain  Medications that remain the same after Hospital Discharge:??  -All other medications will remain the same.    2.) Admitted to the hospital on 11/25/20 due to abdominal pain. Discharge date was 11/28/20. Discharged from [place]?Medications Started at [facility] Discharge:? ?started heparin gtt and transitioned to apixaban for SMV thrombosis - GI performed EGD for chronic nausea, diarrhea, bloating - started flagyl for possible SIBO  3.) Admitted to the hospital on 11/21/20 due to abdominal pain.  Discharge date was 11/21/20. Discharged from [facility] ED . Medications given during visit ondansetron [facility]) injection 4 mg (4 mg Intravenous Given 11/21/20 1052)  HYDROmorphone (DILAUDID) injection 0.5 mg (0.5 mg Intravenous Given 11/21/20 1052)  sodium chloride 0.9 % bolus 500 mL (0 mLs Intravenous Stopped 11/21/20 1120)  acetaminophen (TYLENOL) tablet 1,000 mg (1,000 mg Oral Given 11/21/20 1226)  oxyCODONE (Oxy IR/ROXICODONE) immediate release tablet 5 mg (5 mg Oral Given 11/21/20 1226)    4.) Admitted to the hospital on 10/29/20 due to abdominal pain Discharge date was 10/29/20. Discharged from [facility] ED . Medications started: ondansetron (ZOFRAN ODT) 4 MG disintegrating tablet  Medications: Outpatient Encounter Medications as of 02/23/2021  Medication Sig   allopurinol (ZYLOPRIM) 100 MG tablet Take 2 tablets (200 mg total) by mouth daily.   amLODipine (NORVASC) 10 MG tablet Take 1 tablet (10 mg total) by mouth daily. (Patient not taking: Reported on 12/12/2020)   calcium carbonate (TUMS) 500 MG chewable tablet Chew 1 tablet (200 mg of elemental calcium total) by mouth 2 (two) times daily as needed for indigestion or heartburn.   colchicine 0.6 MG tablet Take 1 tablet by mouth daily.   diclofenac Sodium (VOLTAREN) 1 % GEL Apply topically.   ELIQUIS 5 MG TABS tablet Take 5 mg by mouth 2 (two) times daily.   mycophenolate (CELLCEPT) 250 MG capsule Take 250-500 mg by mouth 2 (two) times daily. 2 capsules (500 mg) in the morning and one capsule (250 mg) in the evening   omeprazole (PRILOSEC) 20 MG capsule TAKE 1 CAPSULE BY MOUTH TWICE DAILY   ondansetron (ZOFRAN ODT) 4 MG disintegrating tablet  Take 1 tablet (4 mg total) by mouth every 8 (eight) hours as needed.   predniSONE (DELTASONE) 20 MG tablet Take 2 tablets (40 mg total) by mouth daily. For 3 days, then 1 tab daily for 3 days   predniSONE (DELTASONE) 5 MG tablet Take 20 mg by mouth daily with  breakfast.   tacrolimus (PROGRAF) 0.5 MG capsule Take 0.5-1 mg by mouth See admin instructions. Take 2 capsules ('1mg'$ ) by mouth every morning and 1 capsule (0.'5mg'$ ) by mouth every night   tamsulosin (FLOMAX) 0.4 MG CAPS capsule Take 1 capsule (0.4 mg total) by mouth daily.   No facility-administered encounter medications on file as of 02/23/2021.   [place] Pharmacist Assistant [phone]   Time spent:30

## 2021-02-26 ENCOUNTER — Other Ambulatory Visit: Payer: Self-pay | Admitting: Internal Medicine

## 2021-03-02 ENCOUNTER — Telehealth: Payer: Medicare Other

## 2021-03-02 ENCOUNTER — Telehealth: Payer: Self-pay | Admitting: Pharmacist

## 2021-03-02 NOTE — Telephone Encounter (Signed)
  Chronic Care Management   Outreach Note  03/02/2021 Name: Devin White MRN: [id number] DOB: 07-17-1955  Referred by: Venia Carbon, MD  Patient had a phone appointment scheduled with clinical pharmacist today.  An unsuccessful telephone outreach was attempted today. The patient was referred to the pharmacist for assistance with medications, care management and care coordination.   Patient will NOT be penalized in any way for missing a CCM appointment. The no-show fee does not apply.  If possible, a message was left to return call to: [phone] or to [facility]: [place], PharmD, Para March, CPP Clinical Pharmacist [facility] [phone]

## 2021-03-02 NOTE — Progress Notes (Deleted)
Chronic Care Management Pharmacy Note  03/02/2021 Name:  Devin Devin MRN:  [id number] DOB:  Jan 15, 1956  Summary: ***  Recommendations/Changes made from today's visit: ***  Plan: ***   Subjective: Devin Devin is an 65 y.o. year old male who is a primary patient of Devin Carbon, MD.  The CCM team was consulted for assistance with disease management and care coordination needs.    Engaged with patient by telephone for initial visit in response to provider referral for pharmacy case management and/or care coordination services.   Consent to Services:  The patient was given the following information about Chronic Care Management services today, agreed to services, and gave verbal consent: 1. CCM service includes personalized support from designated clinical staff supervised by the primary care provider, including individualized plan of care and coordination with other care providers 2. 24/7 contact phone numbers for assistance for urgent and routine care needs. 3. Service will only be billed when office clinical staff spend 20 minutes or more in a month to coordinate care. 4. Only one practitioner may furnish and bill the service in a calendar month. 5.The patient may stop CCM services at any time (effective at the end of the month) by phone call to the office staff. 6. The patient will be responsible for cost sharing (co-pay) of up to 20% of the service fee (after annual deductible is met). Patient agreed to services and consent obtained.  Patient Care Team: Devin Carbon, MD as PCP - Devin Devin Cleaster Corin, [facility] as Pharmacist (Pharmacist)  Recent office visits: 12/12/20 Devin Carbon, MD (PCP) Chronic tophaceous gout . D/C hydralazine, lisinopril, oxycodone (pt no longer taking)   10/20/20 Devin Carbon, MD (PCP) Chronic tophaceous gout  Med change: prednisone 5 mg daily   10/10/20 Devin Carbon, MD (PCP) Chronic tophaceous gout  Med changes:  colchicine 0.6 mg daily, prednisone 5 mg  Take 2 tablets (40 mg total) by mouth daily. For 1 week, then 12m daily for 1 week, then 129mdaily for 1 week., Starting Tue 10/10/2020, Until Fri 10/20/2020, Normal   09/25/20  LeVenia CarbonMD (PCP) Gastroenteritis Med change:predniSONE (DELTASONE) 5 MG tablet  Recent consult visits: 02/22/21 Dr ShCrisoforo Oxfordrheumatolgoy): f/u gout. Increase allopurinol to 300 mg. Rx'd prednisone taper 60 mg. Advised not to increase colchicine during flare.  02/22/21 Dr KhRoyanne Footsdermatology): skin check, benign.  [facility]01[facility]. Maintain prednisone w/o taper in light of recent medical problems. Referred to dermatology. Refer to nephrology. Advised covid and shingrix vaccines.   12[facility]29/20 [facility] - f/u Mesenteric vein thrombosis. VTE provoked in s/o appendectomy surgery. Plan 3 months of Eliquis.  [facility]26[facility]) - upper endoscopic USKoreanodule of esophagus)  11/11/20 Appendectomy  Hospital visits: Medication Reconciliation was completed by comparing discharge summary, patient's EMR and Pharmacy list, and upon discussion with patient.   1.) Admitted to the ED on 12/06/20 due to headache and foot pain. Discharge date was 12/06[facility]20[facility]   [facility] -started oxycodone 5 mg due to pain   Medications that remain the same after Hospital Discharge:??  -All other medications will remain the same.    2.) Admitted to the hospital on 11/25/20 due to Mesenteric vein thrombosis. Discharge date was 11/28/20. [facility] -Start Eliquis 5 mg - 2 tab BID  x 7 days then 5 mg BID - GI performed EGD for chronic nausea, diarrhea, bloating - started Flagyl 250 mg TID x 14 days for possible SIBO  Medication changes: -Changed allopurinol to 200  mg  Medication discontinuation: -stop hydralazine  Medications that remain the same after Hospital Discharge:??  -All other medications will remain the same.    3.) Admitted to the ED on 11/21/20 due to post-op pain, cellulitis. Discharge date was 11/21/20. Discharged from [facility] ED .  New?Medications Started at [facility] Discharge:? -Cephalexin 500 mg BID x 7 days  Medications given during visit ondansetron [facility]) injection 4 mg (4 mg Intravenous Given 11/21/20 1052)  HYDROmorphone (DILAUDID) injection 0.5 mg (0.5 mg Intravenous Given 11/21/20 1052)  sodium chloride 0.9 % bolus 500 mL (0 mLs Intravenous Stopped 11/21/20 1120)  acetaminophen (TYLENOL) tablet 1,000 mg (1,000 mg Oral Given 11/21/20 1226)  oxyCODONE (Oxy IR/ROXICODONE) immediate release tablet 5 mg (5 mg Oral Given 11/21/20 1226)    Medications that remain the same after Hospital Discharge:??  -All other medications will remain the same.   4.) Admitted to the ED on 10/29/20 due to Gastroenteritis. Discharge date was 10/29/20. Discharged from [facility] ED .  Medications started: ondansetron (ZOFRAN ODT) 4 MG disintegrating tablet  Medications that remain the same after Hospital Discharge:??  -All other medications will remain the same.   Medication Reconciliation was completed by comparing discharge summary, patient's EMR and Pharmacy list, and upon discussion with patient.  5) Admitted to the hospital on 10/10/20 due to Gout. Discharge date was 10/14/20. Discharged from [facility].   -received Anakinra inpatient x 3 doses  New?Medications Started at [facility] Discharge:?? -Shingrix vaccine (not given inpatient)  Medication Changes at Hospital Discharge: -Changed allopurinol to 100 mg daily (tinnitus SE) -Changed prednisone to 5 mg daily due to Anakinra  Medications Discontinued at Hospital Discharge: -Stopped lisinopril due to AKI  Medications that remain the same after  Hospital Discharge:??  -All other medications will remain the same.    6) Admitted to the hospital on 09/29/20 due to Tophaceous gout. Discharge date was 10/03/20. Discharged from [place]?Medications Started at [facility] Discharge:?? -started allopurinol 50 mg due to gout -started voltaren gel PRN -started lisinopril 5 mg daily -started oxycodone 5 mg PRN (#20)  Medication Changes at Hospital Discharge: -Changed colchicine to 0.6 mg daily x 3-6 months  Medications that remain the same after Hospital Discharge:??  -All other medications will remain the same.    Objective:  Lab Results  Component Value Date   CREATININE 1.47 12/12/2020   BUN 20 12/12/2020   GFR 50.65 (L) 12/12/2020   GFRNONAA 43 (L) 12/06/2020   GFRAA 48 (L) 08/11/2019   NA 141 12/12/2020   K 3.5 12/12/2020   CALCIUM 9.0 12/12/2020   CO2 28 12/12/2020   GLUCOSE 161 (H) 12/12/2020    Lab Results  Component Value Date/Time   GFR 50.65 (L) 12/12/2020 08:56 AM   GFR 48.07 (L) 04/15/2016 03:51 PM    Last diabetic Eye exam: No results found for: HMDIABEYEEXA  Last diabetic Foot exam: No results found for: HMDIABFOOTEX   Lab Results  Component Value Date   CHOL 158 05/22/2012   HDL 34.10 (L) 05/22/2012   LDLDIRECT 87.7 05/22/2012   TRIG 284.0 (H) 05/22/2012   CHOLHDL 5 05/22/2012    Hepatic Function Latest Ref Rng & Units 12/12/2020 11/21/2020 10/29/2020  Total Protein 6.5 - 8.1 g/dL -  6.1(L) 7.2  Albumin 3.5 - 5.2 g/dL 4.1 3.6 4.2  AST 15 - 41 U/L - 27 26  ALT 0 - 44 U/L - 45(H) 67(H)  Alk Phosphatase 38 - 126 U/L - 45 50  Total Bilirubin 0.3 - 1.2 mg/dL - 1.3(H) 1.4(H)  Bilirubin, Direct 0.0 - 0.3 mg/dL - - -    Lab Results  Component Value Date/Time   TSH 3.99 02/22/2015 08:51 AM   TSH 5.70 (H) 08/17/2013 09:25 AM   FREET4 1.01 02/22/2015 08:51 AM   FREET4 1.13 08/17/2013 09:25 AM    CBC Latest Ref Rng & Units 12/06/2020 11/21/2020 10/29/2020  WBC 4.0 - 10.5 K/uL 9.4 8.0 16.8(H)   Hemoglobin 13.0 - 17.0 g/dL 15.0 13.5 15.9  Hematocrit 39.0 - 52.0 % 43.9 39.4 46.8  Platelets 150 - 400 K/uL 212 150 244    No results found for: VD25OH  Clinical ASCVD: No  The ASCVD Risk score (Arnett DK, et al., 2019) failed to calculate for the following reasons:   Cannot find a previous HDL lab   Cannot find a previous total cholesterol lab    Depression screen [facility] 2/9 05/01/2020 10/10/2017 10/09/2016  Decreased Interest 0 0 0  Down, Depressed, Hopeless 0 0 0  PHQ - 2 Score 0 0 0    Social History   Tobacco Use  Smoking Status Never  Smokeless Tobacco Never   BP Readings from Last 3 Encounters:  12/12/20 122/80  12/06/20 (!) 140/99  11/21/20 (!) 141/101   Pulse Readings from Last 3 Encounters:  12/12/20 76  12/06/20 77  11/21/20 71   Wt Readings from Last 3 Encounters:  12/12/20 263 lb (119.3 kg)  12/06/20 257 lb (116.6 kg)  11/21/20 264 lb (119.7 kg)   BMI Readings from Last 3 Encounters:  12/12/20 36.68 kg/m  12/06/20 33.91 kg/m  11/21/20 34.83 kg/m    Assessment/Interventions: Review of patient past medical history, allergies, medications, health status, including review of consultants reports, laboratory and other test data, was performed as part of comprehensive evaluation and provision of chronic care management services.   SDOH:  (Social Determinants of Health) assessments and interventions performed: Yes  SDOH Screenings   Alcohol Screen: Not on file  Depression (PHQ2-9): Low Risk    PHQ-2 Score: 0  Financial Resource Strain: Not on file  Food Insecurity: Not on file  Housing: Not on file  Physical Activity: Not on file  Social Connections: Not on file  Stress: Not on file  Tobacco Use: Low Risk    Smoking Tobacco Use: Never   Smokeless Tobacco Use: Never  Transportation Needs: Not on file    [place]  No Known Allergies  Medications Reviewed Today     Reviewed by Devin Carbon, MD (Physician) on 12/12/20 at [phone]  Med  List Status: <None>   Medication Order Taking? Sig Documenting Provider Last Dose Status Informant  allopurinol (ZYLOPRIM) 100 MG tablet [id number] Yes Take 2 tablets by mouth daily. [provider] Taking Active   amLODipine (NORVASC) 10 MG tablet [id number] No Take 1 tablet (10 mg total) by mouth daily.  Patient not taking: Reported on 12/12/2020   Geradine Girt, DO Not Taking Active   calcium carbonate (TUMS) 500 MG chewable tablet [id number] Yes Chew 1 tablet (200 mg of elemental calcium total) by mouth 2 (two) times daily as needed for indigestion or heartburn. Tonia Ghent, MD Taking Active Self  colchicine 0.6 MG tablet [id number] Yes Take  1 tablet by mouth daily. [provider] Taking Active   diclofenac Sodium (VOLTAREN) 1 % GEL [id number] Yes Apply topically. [provider] Taking Active   ELIQUIS 5 MG TABS tablet [id number]  Take 5 mg by mouth 2 (two) times daily. [provider]  Active   mycophenolate (CELLCEPT) 250 MG capsule [id number] Yes Take 250-500 mg by mouth 2 (two) times daily. 2 capsules (500 mg) in the morning and one capsule (250 mg) in the evening [provider] Taking Active Self  omeprazole (PRILOSEC) 20 MG capsule [id number] Yes TAKE 1 CAPSULE BY MOUTH TWICE DAILY Devin Carbon, MD Taking Active   ondansetron (ZOFRAN ODT) 4 MG disintegrating tablet [id number] Yes Take 1 tablet (4 mg total) by mouth every 8 (eight) hours as needed. Lavonia Drafts, MD Taking Active   predniSONE (DELTASONE) 5 MG tablet [id number] Yes Take 20 mg by mouth daily with breakfast. [provider] Taking Active   tacrolimus (PROGRAF) 0.5 MG capsule [id number] Yes Take 0.5-1 mg by mouth See admin instructions. Take 2 capsules (78m) by mouth every morning and 1 capsule (0.517m by mouth every night [provider] Taking Active Self  tamsulosin (FLOMAX) 0.4 MG CAPS capsule [id number] Take 1 capsule (0.4 mg total) by mouth daily. LeVenia CarbonMD Taking Active             Patient Active Problem List   Diagnosis Date Noted   Mesenteric thrombosis (HCCombee Settlement06/28/2022   Gastroenteritis 09/25/2020   Achilles tendon mass 09/25/2020   Hypertensive urgency 09/22/2020   Joint pain 09/22/2020   Recurrent UTI 03/26/2019   Cystitis 12/01/2018   Pulmonary nodule 08/04/2018   Liver transplant status (HCC[place]04/26/2019   Advance directive discussed with patient 10/10/2017   Stage 3b chronic kidney disease (HCEly[place]04/25/2018   Mallory-Weiss tear 04/16/2016   Long-term use of immunosuppressant medication 04/08/2016   Sleep apnea 10/06/2015   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 Francois04/02/2011   Chronic tophaceous gout 12/21/2008   BPH without urinary obstruction 12/21/2008   ALLERGIC RHINITIS 05/22/2007   GERD 05/22/2007   HIATAL HERNIA 05/22/2007   IRRITABLE BOWEL SYNDROME, HX OF 05/22/2007   RENAL CALCULUS, HX OF 05/22/2007    Immunization History  Administered Date(s) Administered   Fluad Quad(high Dose 65+) 03/26/2019, 05/01/2020   Influenza Split 04/15/2011   Influenza Whole 04/17/2010   Influenza, High Dose Seasonal PF 03/30/2018   Influenza, Seasonal, Injecte, Preservative Fre 05/22/2012   Influenza,inj,Quad PF,6+ Mos 08/09/2013, 03/30/2015, 03/22/2016, 04/07/2017   Pneumococcal Conjugate-13 12/01/2014   Pneumococcal Polysaccharide-23 06/01/2012, 10/10/2017   Tdap 05/22/2012    Conditions to be addressed/monitored:  Hypertension and Chronic Kidney Disease, chronic tophaceous gout, liver transplant status, Mesenteric thrombosis  There are no care plans that you recently modified to display for this patient.    Medication Assistance: {MEDASSISTANCEINFO:25044}  Compliance/Adherence/Medication fill history: Care  Gaps: None  Star-Rating Drugs: None  Patient's preferred pharmacy is:  [facility] - [address], [address] [address], [id number]: [phone]: [phone]#[id number] RabonNC[id number]: [phone]: [phone] pill box? {Yes or  If no, why not?:20788} Pt endorses ***% compliance  We discussed: {Pharmacy options:24294} Patient decided to: {US Pharmacy Plan:23885}  Care Plan and Follow Up Patient Decision:  {FOLLOWUP:24991}  Plan: {CM FOLLOW UP HGDJ:24268}  ***    Current Barriers:  {pharmacybarriers:24917}  Pharmacist Clinical Goal(s):  Patient will {PHARMACYGOALCHOICES:24921} through collaboration with PharmD and provider.   Interventions: 1:1 collaboration with Devin Carbon, MD regarding development and update of comprehensive plan of care as evidenced by provider attestation and co-signature Inter-disciplinary care team collaboration (see longitudinal plan of care) Comprehensive medication review performed; medication list updated in electronic medical record  Hypertension (BP goal <130/80) -{US controlled/uncontrolled:25276} -Current treatment: Amlodipine 10 mg daily - LF 09/24/20 x 30ds -Medications previously tried: ***  -Current home readings: *** -Current dietary habits: *** -Current exercise habits: *** -{ACTIONS;DENIES/REPORTS:21021675::"Denies"} hypotensive/hypertensive symptoms -Educated on {CCM BP Counseling:25124} -Counseled to monitor BP at home ***, document, and provide log at future appointments -{CCMPHARMDINTERVENTION:25122}  Gout (Goal: uric acid <5) -{US controlled/uncontrolled:25276} -Current treatment  Allopurinol 300 mg daily Colchicine 0.6 mg daily Prednisone 20 mg - taper -Medications previously tried: ***   -{CCMPHARMDINTERVENTION:25122}  Liver transplant status (Goal: ***) -{US controlled/uncontrolled:25276} -Current treatment  Tacrolimus 0.5 mg - 2 cap AM and 1 cap PM Mycophenolate 250 mg - 2 cap AM and 1 cap PM Prednisone 5 mg  -Medications previously tried: ***  -{CCMPHARMDINTERVENTION:25122}  BPH (Goal: manage symptoms) -{US controlled/uncontrolled:25276} -Current treatment  Tamsulosin 0.4 mg daily -Medications previously tried: ***  -{CCMPHARMDINTERVENTION:25122}  GERD (Goal: manage symptoms) -{US controlled/uncontrolled:25276} -Current treatment  Omeprazole 20 mg BID Tums PRN -Medications previously tried: ***  -{CCMPHARMDINTERVENTION:25122}  Mesenteric vein thrombosis (Goal: ***) -{US controlled/uncontrolled:25276} -Current treatment  Eliquis 5 mg BID (3 months total) -Medications previously tried: ***  -{CCMPHARMDINTERVENTION:25122}  Pain (Goal: ***) -{US controlled/uncontrolled:25276} -Current treatment  Voltaren gel PRN -Medications previously tried: ***  -{CCMPHARMDINTERVENTION:25122}  Health Maintenance -Vaccine gaps: *** -Current therapy:  Ondansetron ODT 4 mg PRN -Educated on {ccm supplement counseling:25128} -{CCM Patient satisfied:25129} -{CCMPHARMDINTERVENTION:25122}  Patient Goals/Self-Care Activities Patient will:  - {pharmacypatientgoals:24919}

## 2021-04-06 ENCOUNTER — Encounter: Payer: Self-pay | Admitting: Emergency Medicine

## 2021-04-06 ENCOUNTER — Other Ambulatory Visit: Payer: Self-pay

## 2021-04-06 DIAGNOSIS — I129 Hypertensive chronic kidney disease with stage 1 through stage 4 chronic kidney disease, or unspecified chronic kidney disease: Secondary | ICD-10-CM | POA: Diagnosis not present

## 2021-04-06 DIAGNOSIS — R531 Weakness: Secondary | ICD-10-CM | POA: Diagnosis not present

## 2021-04-06 DIAGNOSIS — E860 Dehydration: Secondary | ICD-10-CM | POA: Diagnosis not present

## 2021-04-06 DIAGNOSIS — J9811 Atelectasis: Secondary | ICD-10-CM | POA: Diagnosis not present

## 2021-04-06 DIAGNOSIS — R197 Diarrhea, unspecified: Secondary | ICD-10-CM | POA: Diagnosis not present

## 2021-04-06 DIAGNOSIS — R110 Nausea: Secondary | ICD-10-CM | POA: Insufficient documentation

## 2021-04-06 DIAGNOSIS — Z79899 Other long term (current) drug therapy: Secondary | ICD-10-CM | POA: Insufficient documentation

## 2021-04-06 DIAGNOSIS — R0602 Shortness of breath: Secondary | ICD-10-CM | POA: Diagnosis not present

## 2021-04-06 DIAGNOSIS — R202 Paresthesia of skin: Secondary | ICD-10-CM | POA: Insufficient documentation

## 2021-04-06 DIAGNOSIS — H538 Other visual disturbances: Secondary | ICD-10-CM | POA: Diagnosis not present

## 2021-04-06 DIAGNOSIS — R059 Cough, unspecified: Secondary | ICD-10-CM | POA: Diagnosis not present

## 2021-04-06 DIAGNOSIS — R1011 Right upper quadrant pain: Secondary | ICD-10-CM | POA: Diagnosis not present

## 2021-04-06 DIAGNOSIS — M4312 Spondylolisthesis, cervical region: Secondary | ICD-10-CM | POA: Diagnosis not present

## 2021-04-06 DIAGNOSIS — R4182 Altered mental status, unspecified: Secondary | ICD-10-CM | POA: Diagnosis not present

## 2021-04-06 DIAGNOSIS — K219 Gastro-esophageal reflux disease without esophagitis: Secondary | ICD-10-CM | POA: Diagnosis not present

## 2021-04-06 DIAGNOSIS — M79606 Pain in leg, unspecified: Secondary | ICD-10-CM | POA: Insufficient documentation

## 2021-04-06 DIAGNOSIS — N1832 Chronic kidney disease, stage 3b: Secondary | ICD-10-CM | POA: Insufficient documentation

## 2021-04-06 DIAGNOSIS — J341 Cyst and mucocele of nose and nasal sinus: Secondary | ICD-10-CM | POA: Diagnosis not present

## 2021-04-06 DIAGNOSIS — I10 Essential (primary) hypertension: Secondary | ICD-10-CM | POA: Diagnosis not present

## 2021-04-06 DIAGNOSIS — Z20822 Contact with and (suspected) exposure to covid-19: Secondary | ICD-10-CM | POA: Insufficient documentation

## 2021-04-06 DIAGNOSIS — R519 Headache, unspecified: Secondary | ICD-10-CM | POA: Insufficient documentation

## 2021-04-06 DIAGNOSIS — R112 Nausea with vomiting, unspecified: Secondary | ICD-10-CM | POA: Diagnosis not present

## 2021-04-06 DIAGNOSIS — Q283 Other malformations of cerebral vessels: Secondary | ICD-10-CM | POA: Diagnosis not present

## 2021-04-06 DIAGNOSIS — M542 Cervicalgia: Secondary | ICD-10-CM | POA: Diagnosis not present

## 2021-04-06 DIAGNOSIS — R42 Dizziness and giddiness: Secondary | ICD-10-CM | POA: Diagnosis not present

## 2021-04-06 LAB — COMPREHENSIVE METABOLIC PANEL
ALT: 109 U/L — ABNORMAL HIGH (ref 0–44)
AST: 58 U/L — ABNORMAL HIGH (ref 15–41)
Albumin: 3.6 g/dL (ref 3.5–5.0)
Alkaline Phosphatase: 60 U/L (ref 38–126)
Anion gap: 9 (ref 5–15)
BUN: 16 mg/dL (ref 8–23)
CO2: 20 mmol/L — ABNORMAL LOW (ref 22–32)
Calcium: 8.9 mg/dL (ref 8.9–10.3)
Chloride: 109 mmol/L (ref 98–111)
Creatinine, Ser: 1.47 mg/dL — ABNORMAL HIGH (ref 0.61–1.24)
GFR, Estimated: 53 mL/min — ABNORMAL LOW (ref >60)
Glucose, Bld: 135 mg/dL — ABNORMAL HIGH (ref 70–99)
Potassium: 3.8 mmol/L (ref 3.5–5.1)
Sodium: 138 mmol/L (ref 135–145)
Total Bilirubin: 1.6 mg/dL — ABNORMAL HIGH (ref 0.3–1.2)
Total Protein: 6.8 g/dL (ref 6.5–8.1)

## 2021-04-06 LAB — CBC
HCT: 46.3 % (ref 39.0–52.0)
Hemoglobin: 16.5 g/dL (ref 13.0–17.0)
MCH: 33.2 pg (ref 26.0–34.0)
MCHC: 35.6 g/dL (ref 30.0–36.0)
MCV: 93.2 fL (ref 80.0–100.0)
Platelets: 163 10*3/uL (ref 150–400)
RBC: 4.97 MIL/uL (ref 4.22–5.81)
RDW: 15.1 % (ref 11.5–15.5)
WBC: 11.9 10*3/uL — ABNORMAL HIGH (ref 4.0–10.5)
nRBC: 0 % (ref 0.0–0.2)

## 2021-04-06 LAB — TROPONIN I (HIGH SENSITIVITY): Troponin I (High Sensitivity): 31 ng/L — ABNORMAL HIGH (ref <18)

## 2021-04-06 LAB — LIPASE, BLOOD: Lipase: 29 U/L (ref 11–51)

## 2021-04-06 NOTE — ED Triage Notes (Signed)
Pt c/o headache, nausea, vomiting, diarrhea and weakness for the past few days. C/o left arm and leg pain as well. Walked to triage with limp, states some shob as well, NAD.

## 2021-04-07 ENCOUNTER — Emergency Department: Payer: Medicare Other

## 2021-04-07 ENCOUNTER — Emergency Department
Admission: EM | Admit: 2021-04-07 | Discharge: 2021-04-07 | Disposition: A | Discharge status: Home / Self Care | Payer: Medicare Other | Attending: Emergency Medicine | Admitting: Emergency Medicine

## 2021-04-07 DIAGNOSIS — R202 Paresthesia of skin: Secondary | ICD-10-CM

## 2021-04-07 DIAGNOSIS — I10 Essential (primary) hypertension: Secondary | ICD-10-CM | POA: Diagnosis not present

## 2021-04-07 DIAGNOSIS — R112 Nausea with vomiting, unspecified: Secondary | ICD-10-CM | POA: Diagnosis not present

## 2021-04-07 DIAGNOSIS — R059 Cough, unspecified: Secondary | ICD-10-CM | POA: Diagnosis not present

## 2021-04-07 DIAGNOSIS — R051 Acute cough: Secondary | ICD-10-CM

## 2021-04-07 DIAGNOSIS — J9811 Atelectasis: Secondary | ICD-10-CM | POA: Diagnosis not present

## 2021-04-07 DIAGNOSIS — M4312 Spondylolisthesis, cervical region: Secondary | ICD-10-CM | POA: Diagnosis not present

## 2021-04-07 DIAGNOSIS — R197 Diarrhea, unspecified: Secondary | ICD-10-CM

## 2021-04-07 DIAGNOSIS — H538 Other visual disturbances: Secondary | ICD-10-CM | POA: Diagnosis not present

## 2021-04-07 DIAGNOSIS — R531 Weakness: Secondary | ICD-10-CM | POA: Diagnosis not present

## 2021-04-07 DIAGNOSIS — R519 Headache, unspecified: Secondary | ICD-10-CM | POA: Diagnosis not present

## 2021-04-07 DIAGNOSIS — Q283 Other malformations of cerebral vessels: Secondary | ICD-10-CM | POA: Diagnosis not present

## 2021-04-07 DIAGNOSIS — R1011 Right upper quadrant pain: Secondary | ICD-10-CM | POA: Diagnosis not present

## 2021-04-07 DIAGNOSIS — J341 Cyst and mucocele of nose and nasal sinus: Secondary | ICD-10-CM | POA: Diagnosis not present

## 2021-04-07 DIAGNOSIS — R42 Dizziness and giddiness: Secondary | ICD-10-CM | POA: Diagnosis not present

## 2021-04-07 LAB — URINALYSIS, ROUTINE W REFLEX MICROSCOPIC
Bacteria, UA: NONE SEEN
Bilirubin Urine: NEGATIVE
Glucose, UA: NEGATIVE mg/dL
Ketones, ur: NEGATIVE mg/dL
Leukocytes,Ua: NEGATIVE
Nitrite: NEGATIVE
Protein, ur: NEGATIVE mg/dL
Specific Gravity, Urine: >1.046 — ABNORMAL HIGH (ref 1.005–1.030)
pH: 5 (ref 5.0–8.0)

## 2021-04-07 LAB — RESP PANEL BY RT-PCR (FLU A&B, COVID) ARPGX2
Influenza A by PCR: NEGATIVE
Influenza B by PCR: NEGATIVE
SARS Coronavirus 2 by RT PCR: NEGATIVE

## 2021-04-07 LAB — TROPONIN I (HIGH SENSITIVITY): Troponin I (High Sensitivity): 40 ng/L — ABNORMAL HIGH (ref <18)

## 2021-04-07 LAB — PROCALCITONIN: Procalcitonin: 0.21 ng/mL

## 2021-04-07 MED ORDER — LACTATED RINGERS IV BOLUS
1000.0000 mL | Freq: Once | INTRAVENOUS | Status: AC
  Administered 2021-04-07: 1000 mL via INTRAVENOUS

## 2021-04-07 MED ORDER — ACETAMINOPHEN 500 MG PO TABS
1000.0000 mg | ORAL_TABLET | Freq: Once | ORAL | Status: AC
  Administered 2021-04-07: 1000 mg via ORAL
  Filled 2021-04-07: qty 2

## 2021-04-07 MED ORDER — ONDANSETRON HCL 4 MG/2ML IJ SOLN
4.0000 mg | Freq: Once | INTRAMUSCULAR | Status: AC
  Administered 2021-04-07: 4 mg via INTRAVENOUS
  Filled 2021-04-07: qty 2

## 2021-04-07 MED ORDER — PROCHLORPERAZINE EDISYLATE 10 MG/2ML IJ SOLN
10.0000 mg | Freq: Once | INTRAMUSCULAR | Status: AC
  Administered 2021-04-07: 10 mg via INTRAVENOUS
  Filled 2021-04-07: qty 2

## 2021-04-07 MED ORDER — ONDANSETRON 4 MG PO TBDP: 4.0000 mg | ORAL_TABLET | Freq: Three times a day (TID) | ORAL | 0 refills | Status: DC | PRN

## 2021-04-07 MED ORDER — IOHEXOL 350 MG/ML SOLN
75.0000 mL | Freq: Once | INTRAVENOUS | Status: AC | PRN
  Administered 2021-04-07: 75 mL via INTRAVENOUS

## 2021-04-07 NOTE — ED Notes (Signed)
Left eye 20/70 Right eye 20/30

## 2021-04-07 NOTE — ED Provider Notes (Signed)
[facility] Emergency Department Provider Note  ____________________________________________   Event Date/Time   First MD Initiated Contact with Patient 04/07/21 0104     (approximate)  I have reviewed the triage vital signs and the nursing notes.   HISTORY  Chief Complaint Nausea, Diarrhea, Shortness of Breath, and Altered Mental Status   HPI Devin White is a 64 y.o. 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  Patient also states he has had 1 day of some mild shortness of breath and some achiness neck, and leg as well.  He is also noticing mild headache over the last 2 or 3 days.  No recent falls or injuries.  He denies any right-sided weakness paresthesias or numbness.  He is denying any vomiting, hemoptysis, urinary symptoms, abdominal pain rash or recent falls or injuries.  He does state he has had a couple streaks of red blood in his diarrhea but otherwise no melanotic stools or large bloody stools.         Past Medical History:  Diagnosis Date   Arthritis    back and legs   Benign prostatic hypertrophy    GERD (gastroesophageal reflux disease)    Gout    History of blood transfusion    pt has antibodies in his blood since previous transfusions   History of cirrhosis of liver S/P TRANSPLANT 2009   History of liver failure S/P TRANSPLANT   Hypertension    Left ureteral calculus     Patient Active Problem List   Diagnosis Date Noted   Mesenteric thrombosis ([place]) 12/12/2020   Gastroenteritis  09/25/2020   Achilles tendon mass 09/25/2020   Hypertensive urgency 09/22/2020   Joint pain 09/22/2020   Recurrent UTI 03/26/2019   Cystitis 12/01/2018   Pulmonary nodule 08/04/2018   Liver transplant status ([place]) 10/10/2017   Advance directive discussed with patient 10/10/2017   Stage 3b chronic kidney disease ([place]) 10/09/2016   Mallory-Weiss tear 04/16/2016   Long-term use of immunosuppressant medication 04/08/2016   Sleep apnea 10/06/2015   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[place]) 04/10/2015   Immunosuppression ([place]) 06/01/2012   Hypertension    Routine general medical examination at a health care facility 04/26/2011   Chronic liver failure ([place]) 09/24/2010   Chronic tophaceous gout 12/21/2008   BPH without urinary obstruction 12/21/2008   ALLERGIC RHINITIS 05/22/2007   GERD 05/22/2007   HIATAL HERNIA 05/22/2007   IRRITABLE BOWEL SYNDROME, HX OF 05/22/2007   RENAL CALCULUS, HX OF 05/22/2007    Past Surgical History:  Procedure Laterality Date   APPENDECTOMY     bone morrow biopsy     CHOLECYSTECTOMY  2007   CYSTO/ LEFT RETROGRADE PYELOGRAM/ LEFT URETERAL STENT PLACEMENT  03-05-2012  DR Tresa Moore [facility])   LEFT URETERAL CALCULI   CYSTOSCOPY W/ URETERAL STENT PLACEMENT  03/11/2012   Procedure: CYSTOSCOPY WITH STENT REPLACEMENT;  Surgeon: Alexis Frock, MD;  Location: [facility];  Service: Urology;  Laterality: Left;   HERNIA REPAIR  2008   LIVER BIOPSY     LIVER TRANSPLANT  11/24/2007   pt states doing well since liver transplant   LUMBAR DISC SURGERY     LUMBAR FUSION     removal of fistula     URETEROSCOPY  03/11/2012   Procedure: URETEROSCOPY;  Surgeon: Alexis Frock, MD;  Location: [facility];  Service: Urology;  Laterality: Left;   STONE MANIPULATION, stone obtained  [name]    Prior to Admission medications   Medication Sig Start Date End Date Taking? Authorizing Provider  allopurinol (ZYLOPRIM) 100 MG  tablet Take 2 tablets (200 mg total) by mouth daily. 01/22/21 01/22/22  Venia Carbon, MD  amLODipine (NORVASC) 10 MG tablet Take 1 tablet (10 mg total) by mouth daily. Patient not taking: Reported on 12/12/2020 09/25/20   Geradine Girt, DO  calcium carbonate (TUMS) 500 MG chewable tablet Chew 1 tablet (200 mg of elemental calcium total) by mouth 2 (two) times daily as needed for indigestion or heartburn. 06/26/16   Tonia Ghent, MD  colchicine 0.6 MG tablet Take 1 tablet by mouth daily. 10/04/20   [provider]  diclofenac Sodium (VOLTAREN) 1 % GEL Apply topically. 10/03/20 10/03/21  [provider]  ELIQUIS 5 MG TABS tablet Take 5 mg by mouth 2 (two) times daily. 11/29/20   [provider]  mycophenolate (CELLCEPT) 250 MG capsule Take 250-500 mg by mouth 2 (two) times daily. 2 capsules (500 mg) in the morning and one capsule (250 mg) in the evening 02/04/19   [provider]  omeprazole (PRILOSEC) 20 MG capsule TAKE 1 CAPSULE BY MOUTH TWICE DAILY 02/26/21   Venia Carbon, MD  ondansetron (ZOFRAN ODT) 4 MG disintegrating tablet Take 1 tablet (4 mg total) by mouth every 8 (eight) hours as needed. 10/29/20   Lavonia Drafts, MD  predniSONE (DELTASONE) 20 MG tablet Take 2 tablets (40 mg total) by mouth daily. For 3 days, then 1 tab daily for 3 days 02/06/21   Venia Carbon, MD  predniSONE (DELTASONE) 5 MG tablet Take 20 mg by mouth daily with breakfast.    [provider]  tacrolimus (PROGRAF) 0.5 MG capsule Take 0.5-1 mg by mouth See admin instructions. Take 2 capsules (1mg ) by mouth every morning and 1 capsule (0.5mg ) by mouth every night    [provider]  tamsulosin (FLOMAX) 0.4 MG CAPS capsule Take 1 capsule (0.4 mg total) by mouth daily. 12/12/20   Venia Carbon, MD    Allergies Patient has no known allergies.  Family History  Problem Relation Age of Onset   Cancer Mother        colon   Arthritis Mother    Vasculitis Mother     Hypertension Mother    Cancer Father        colon   Kidney disease Father    Arthritis Father    Arthritis Brother    Alcohol abuse Maternal Aunt    Diabetes Maternal Aunt    Alcohol abuse Maternal Uncle    Diabetes Paternal Aunt    Alcohol abuse Maternal Uncle    Alcohol abuse Maternal Uncle     Social History Social History   Tobacco Use   Smoking status: Never   Smokeless tobacco: Never  Substance Use Topics   Alcohol use: No   Drug use: No    Review of Systems  Review of Systems  Constitutional:  Positive for malaise/fatigue. Negative for chills and fever.  HENT:  Negative for sore throat.   Eyes:  Positive for blurred vision. Negative for pain.  Respiratory:  Positive for cough and shortness of breath. Negative for stridor.   Cardiovascular:  Negative for chest pain.  Gastrointestinal:  Positive for diarrhea and nausea.  Genitourinary:  Negative for dysuria.  Musculoskeletal:  Positive for myalgias.  Skin:  Negative for rash.  Neurological:  Positive for sensory change (L hemibody), weakness and headaches. Negative for seizures and loss of consciousness.  Psychiatric/Behavioral:  Negative for suicidal ideas.   All other systems reviewed and are negative.    ____________________________________________   PHYSICAL EXAM:  VITAL SIGNS: ED Triage Vitals  Enc Vitals Group     BP 04/06/21 1615 (!) 142/98     Pulse Rate 04/06/21 1615 94     Resp 04/06/21 1615 18     Temp 04/06/21 1615 97.6 F (36.4 C)     Temp Source 04/06/21 1615 Oral     SpO2 04/06/21 1615 95 %     Weight 04/06/21 1610 260 lb (117.9 kg)     Height 04/06/21 1610 6' (1.829 m)     Head Circumference --      Peak Flow --      Pain Score 04/06/21 1610 8     Pain Loc --      Pain Edu? --      Excl. in [place]? --    Vitals:   04/07/21 0049 04/07/21 0052  BP:  (!) 157/92  Pulse: 96 95  Resp: 18 18  Temp:    SpO2: 97% 97%   Physical Exam Vitals and nursing note reviewed.  Constitutional:       Appearance: He is well-developed.  HENT:     Head: Normocephalic and atraumatic.     Right Ear: External ear normal.     Left Ear: External ear normal.     Nose: Nose normal.     Mouth/Throat:     Mouth: Mucous membranes are dry.  Eyes:     Conjunctiva/sclera: Conjunctivae normal.  Cardiovascular:     Rate and Rhythm: Normal rate and regular rhythm.     Heart sounds: No murmur heard. Pulmonary:     Effort: Pulmonary effort is normal. No respiratory distress.     Breath sounds: Normal breath sounds.  Abdominal:     Palpations: Abdomen is soft.     Tenderness: There is no abdominal tenderness.  Musculoskeletal:     Cervical back: Neck supple.  Skin:    General: Skin is warm and dry.     Capillary Refill: Capillary refill takes more than 3 seconds.  Neurological:     Mental Status: He is alert and oriented to person, place, and time.  Psychiatric:        Mood and Affect: Mood normal.    There is no pronator drift or finger dysmetria.  Patient has symmetric strength in his bilateral upper and lower extremities.  Sensation is subjectively decreased to light touch in the left upper extremity and left lower extremity as well as on the left face compared to the right.  Visual acuity 20/70 on the left and 20/30 on the right. ____________________________________________   LABS (all labs ordered are listed, but only abnormal results are displayed)  Labs Reviewed  COMPREHENSIVE METABOLIC PANEL - Abnormal; Notable for the following components:      Result Value   CO2 20 (*)    Glucose, Bld 135 (*)    Creatinine, Ser 1.47 (*)    AST 58 (*)  ALT 109 (*)    Total Bilirubin 1.6 (*)    GFR, Estimated 53 (*)    All other components within normal limits  CBC - Abnormal; Notable for the following components:   WBC 11.9 (*)    All other components within normal limits  URINALYSIS, ROUTINE W REFLEX MICROSCOPIC - Abnormal; Notable for the following components:   Color, Urine YELLOW  (*)    APPearance CLEAR (*)    Specific Gravity, Urine >1.046 (*)    Hgb urine dipstick SMALL (*)    All other components within normal limits  TROPONIN I (HIGH SENSITIVITY) - Abnormal; Notable for the following components:   Troponin I (High Sensitivity) 31 (*)    All other components within normal limits  RESP PANEL BY RT-PCR (FLU A&B, COVID) ARPGX2  GASTROINTESTINAL PANEL BY PCR, STOOL (REPLACES STOOL CULTURE)  LIPASE, BLOOD  PROCALCITONIN  ACETAMINOPHEN LEVEL  TACROLIMUS LEVEL   ____________________________________________  EKG  ECG shows sinus rhythm with peritracheal complexes at a rate of 94, normal axis, unremarkable intervals with nonspecific ST change in aVL without other clearance of acute ischemia or significant arrhythmia. ____________________________________________  RADIOLOGY  ED MD interpretation: Chest x-ray shows no overt edema, focal consolidation, pneumothorax, effusion or other clear acute thoracic process.  CTA head and neck shows no evidence of intracranial hemorrhage, subacute CVA infection or other acute intracranial or cervical process.  MR brain shows no evidence of a CVA but does show some moderate advanced nonspecific cerebral white matter signal changes most likely chronic small vessel disease.  Right upper quadrant ultrasound shows hepatic steatosis but no evidence of bile duct dilation or obstruction.  There are some chronic renal atrophy.  Official radiology report(s): CT ANGIO HEAD NECK W WO CM  Result Date: 04/07/2021 CLINICAL DATA:  Left-sided facial pain, dizziness, blurry vision in left eye EXAM: CT ANGIOGRAPHY HEAD AND NECK TECHNIQUE: Multidetector CT imaging of the head and neck was performed using the standard protocol during bolus administration of intravenous contrast. Multiplanar CT image reconstructions and MIPs were obtained to evaluate the vascular anatomy. Carotid stenosis measurements (when applicable) are obtained utilizing NASCET  criteria, using the distal internal carotid diameter as the denominator. CONTRAST:  16mL OMNIPAQUE IOHEXOL 350 MG/ML SOLN COMPARISON:  No prior CTA, correlation is made with CT head 12/06/2020 FINDINGS: CT HEAD FINDINGS Brain: No evidence of acute infarction, hemorrhage, cerebral edema, mass, mass effect, or midline shift. No extra-axial fluid collection. Periventricular white matter changes, likely the sequela of chronic small vessel ischemic disease. Vascular: No hyperdense vessel or unexpected calcification. Skull: Normal. Negative for fracture or focal lesion. Sinuses/Orbits: Mucous retention cyst in the inferior left maxillary sinus. Otherwise negative. Other: The mastoid air cells are well aerated. CTA NECK FINDINGS Aortic arch: Standard branching. Imaged portion shows no evidence of aneurysm or dissection. No significant stenosis of the major arch vessel origins. Right carotid system: No evidence of dissection, stenosis (50% or greater) or occlusion. Left carotid system: No evidence of dissection, stenosis (50% or greater) or occlusion. Vertebral arteries: Left dominant with a very small right vertebral artery, favored to be hypoplastic given the size of the vertebral foramen. No evidence of dissection, stenosis (50% or greater) or occlusion. Skeleton: Degenerative changes in the cervical spine with anterolisthesis of C3 on C4 and C4 on C5. No acute osseous abnormality. Other neck: Negative. Upper chest: No focal pulmonary opacity. Review of the MIP images confirms the above findings CTA HEAD FINDINGS Anterior circulation: Both internal carotid arteries are  patent to the termini, without stenosis or other abnormality. A1 segments patent, with a hypoplastic left A1. Normal anterior communicating artery. Anterior cerebral arteries are patent to their distal aspects. No M1 stenosis or occlusion. Normal MCA bifurcations. Distal MCA branches perfused and symmetric. Posterior circulation: Vertebral arteries widely  patent to the vertebrobasilar junction without stenosis. Posterior inferior cerebral arteries patent bilaterally. Basilar is diminutive but patent to its distal aspect. Superior cerebral arteries patent bilaterally. Near fetal origin of the left PCA. PCAs well perfused to their distal aspects without stenosis. Bilateral posterior communicating arteries are visualized, diminutive on the right. Venous sinuses: As permitted by contrast timing, patent. Anatomic variants: None significant Review of the MIP images confirms the above findings IMPRESSION: 1. No acute intracranial process. 2. No hemodynamically significant stenosis in the neck. 3. No intracranial large vessel occlusion or significant stenosis. Electronically Signed   By: Merilyn Baba M.D.   On: 04/07/2021 02:41   DG Chest 2 View  Result Date: 04/07/2021 CLINICAL DATA:  Headache, nausea, vomiting, diarrhea and weakness. EXAM: CHEST - 2 VIEW COMPARISON:  November 21, 2020 FINDINGS: Mild, stable atelectasis is seen bilateral lung bases. There is no evidence of acute infiltrate, pleural effusion or pneumothorax. The heart size and mediastinal contours are within normal limits. The visualized skeletal structures are unremarkable. IMPRESSION: Stable exam without acute cardiopulmonary disease. Electronically Signed   By: Virgina Norfolk M.D.   On: 04/07/2021 00:19   MR BRAIN WO CONTRAST  Result Date: 04/07/2021 CLINICAL DATA:  65 year old male with headache, nausea vomiting, diarrhea and weakness. EXAM: MRI HEAD WITHOUT CONTRAST TECHNIQUE: Multiplanar, multiecho pulse sequences of the brain and surrounding structures were obtained without intravenous contrast. COMPARISON:  CT head, CTA head and neck 0220 hours today, and earlier. FINDINGS: Brain: No restricted diffusion to suggest acute infarction. No midline shift, mass effect, evidence of mass lesion, ventriculomegaly, extra-axial collection or acute intracranial hemorrhage. Cervicomedullary junction and  pituitary are within normal limits. Widely scattered cerebral white matter T2 and FLAIR hyperintensity, with patchy and confluent areas of involvement such as the periatrial white matter. No cortical encephalomalacia identified. No chronic cerebral blood products. Deep gray matter nuclei, brainstem and cerebellum appear negative. Vascular: Major intracranial vascular flow voids are preserved. The distal left vertebral artery appears dominant. Skull and upper cervical spine: Negative visible cervical spine. Visualized bone marrow signal is within normal limits. Sinuses/Orbits: Negative orbits. Scattered generally mild paranasal sinus mucosal thickening. Small mucous retention cyst in the left maxillary sinus. No sinus fluid levels. Other: Mastoids are clear. Visible internal auditory structures appear normal. Visible scalp and face appear negative. IMPRESSION: 1. No acute intracranial abnormality. 2. Moderately advanced but nonspecific cerebral white matter signal changes, most commonly due to chronic small vessel disease. Electronically Signed   By: Genevie Ann M.D.   On: 04/07/2021 04:11   US ABDOMEN LIMITED RUQ (LIVER/GB)  Result Date: 04/07/2021 CLINICAL DATA:  65 year old male with nausea vomiting, diarrhea and weakness. Prior cholecystectomy. EXAM: ULTRASOUND ABDOMEN LIMITED RIGHT UPPER QUADRANT COMPARISON:  CT Abdomen and Pelvis 11/21/2020. FINDINGS: Gallbladder: Surgically absent. Common bile duct: Diameter: 4 mm, normal. Liver: Liver echogenicity is at the upper limits of normal to increased. Hepatic echotexture is coarse. No discrete liver lesion. No intrahepatic biliary ductal dilatation. Portal vein is patent on color Doppler imaging with normal direction of blood flow towards the liver. Other: Chronic renal cortical volume loss (image 35). IMPRESSION: 1. Possible Hepatic steatosis. 2. Surgically absent gallbladder with no evidence of bile duct  obstruction. 3. Chronic renal cortical atrophy.  Electronically Signed   By: Genevie Ann M.D.   On: 04/07/2021 04:42    ____________________________________________   PROCEDURES  Procedure(s) performed (including Critical Care):  Procedures   ____________________________________________   INITIAL IMPRESSION / ASSESSMENT AND PLAN / ED COURSE      Patient presents with above-stated history exam for assessment of approximately 1 week of nausea and diarrhea as well as 2 days of headache with some soreness in the left hemibody, and decree sensation in the left hemibody and some blurriness in left eye.  He has no weakness.  He is slightly hypertensive otherwise stable vital signs on room air.  He does appear dehydrated.  Is endorsing subjective decrease sensation to light touch on exam of the left hemibody but does have intact strength and is oriented and is not encephalopathic.  With regard to the left-sided paresthesias and blurry vision unclear etiology at this time.  No trauma reported to the eye which does not appear infected there is no and there is no subconjunctival hemorrhage.  History exam is not consistent with angle-closure glaucoma.  CTA head and neck as well as MR brain showed no evidence of ischemia or CVA and overall history is not consistent with a retinal attachment or embolic issue.  Given has been over 24 hours and symptoms began I think he does not require emergent treatment and can see his ophthalmologist in the next 24 to 48 hours.  With regard to his decreased p.o. intake, mild cough, shortness of breath and diarrhea and nausea suspect likely acute infectious process.  COVID influenza PCR is negative.  Right upper quadrant ultrasound shows no evidence of biliary duct obstruction or other acute process.  States he is compliant with his Eliquis including over the last week and I have low suspicion for PE or recurrent abdominal mesenteric thrombosis.  He has no abdominal pain or tenderness on exam to suggest appendicitis,  pyelonephritis, diverticulitis or acute hepatitis.  Chest x-ray shows no overt edema, focal consolidation, pneumothorax, effusion or other clear acute thoracic process.  ECG shows sinus rhythm with peritracheal complexes at a rate of 94, normal axis, unremarkable intervals with nonspecific ST change in aVL without other clearance of acute ischemia or significant arrhythmia.  Troponin slightly elevated at 31 which I suspect represents some mild demand ischemia in setting of dehydration as patient is denying any chest pain.  I will plan to obtain a repeat delta at 2 hours.  CMP remarkable for bicarb of 20, creatinine of 1.47 stable from his last check 3 months ago at 1.47 and an AST of 58 and ALT of 109.  T bili is 1.6 compared to 1.34 months ago.  UA not consistent with cystitis and a small hemoglobin but patient has no back pain and Evalose patient for stone.  CBC with mild leukocytosis with WBC count of 11.9 without evidence of acute anemia and normal platelets.  Lipase within normal limits.  Care patient signed over to assuming father approximately 0 700.  Plan is to follow-up repeat troponin and if stable or downtrending I think patient will be stable for discharge with outpatient follow-up.  He will need follow-up with his ophthalmologist and PCP I discussed this plan with patient.        ____________________________________________   FINAL CLINICAL IMPRESSION(S) / ED DIAGNOSES  Final diagnoses:  RUQ abdominal pain  Diarrhea, unspecified type  Blurry vision  Paresthesia  Acute cough    Medications  lactated  ringers bolus 1,000 mL (1,000 mLs Intravenous New Bag/Given 04/07/21 0158)  acetaminophen (TYLENOL) tablet 1,000 mg (1,000 mg Oral Given 04/07/21 0152)  ondansetron (ZOFRAN) injection 4 mg (4 mg Intravenous Given 04/07/21 0159)  prochlorperazine (COMPAZINE) injection 10 mg (10 mg Intravenous Given 04/07/21 0311)  iohexol (OMNIPAQUE) 350 MG/ML injection 75 mL (75 mLs  Intravenous Contrast Given 04/07/21 0218)     ED Discharge Orders     None        Note:  This document was prepared using Dragon voice recognition software and may include unintentional dictation errors.    Lucrezia Starch, MD 04/07/21 [phone]

## 2021-04-07 NOTE — ED Provider Notes (Signed)
-----------------------------------------   9:49 AM on 04/07/2021 ----------------------------------------- Patient care assumed from Dr. Tamala Julian.  Overall patient's work-up is reassuring.  No clinically significant change in troponin.  Patient has history of slight troponin elevations in the past as well in reviewing his records.  Largely reassuring imaging studies.  Given the patient's reassuring work-up I believe the patient is safe for discharge home with PCP follow-up.  Patient agreeable to plan of care.   Harvest Dark, MD 04/07/21 [phone]

## 2021-04-09 ENCOUNTER — Telehealth: Payer: Self-pay

## 2021-04-09 LAB — TACROLIMUS LEVEL: Tacrolimus (FK506) - LabCorp: 11.8 ng/mL (ref 2.0–20.0)

## 2021-04-09 NOTE — Telephone Encounter (Signed)
Left message on VM per DPR to see how he was doing after ER 04-08-21.  If pt calls back, please note in this message. Thanks.

## 2021-04-09 NOTE — Telephone Encounter (Signed)
Pt called stating that he doesn't feel any better after his ER visit.

## 2021-04-11 DIAGNOSIS — Z23 Encounter for immunization: Secondary | ICD-10-CM | POA: Diagnosis not present

## 2021-04-11 DIAGNOSIS — D849 Immunodeficiency, unspecified: Secondary | ICD-10-CM | POA: Diagnosis not present

## 2021-04-11 DIAGNOSIS — N183 Chronic kidney disease, stage 3 unspecified: Secondary | ICD-10-CM | POA: Diagnosis not present

## 2021-04-26 DIAGNOSIS — R0789 Other chest pain: Secondary | ICD-10-CM | POA: Diagnosis not present

## 2021-04-26 DIAGNOSIS — R140 Abdominal distension (gaseous): Secondary | ICD-10-CM | POA: Diagnosis not present

## 2021-04-26 DIAGNOSIS — R5383 Other fatigue: Secondary | ICD-10-CM | POA: Diagnosis not present

## 2021-04-26 DIAGNOSIS — N200 Calculus of kidney: Secondary | ICD-10-CM | POA: Diagnosis not present

## 2021-04-26 DIAGNOSIS — M542 Cervicalgia: Secondary | ICD-10-CM | POA: Diagnosis not present

## 2021-04-26 DIAGNOSIS — R29818 Other symptoms and signs involving the nervous system: Secondary | ICD-10-CM | POA: Diagnosis not present

## 2021-04-26 DIAGNOSIS — R7989 Other specified abnormal findings of blood chemistry: Secondary | ICD-10-CM | POA: Diagnosis not present

## 2021-04-26 DIAGNOSIS — I129 Hypertensive chronic kidney disease with stage 1 through stage 4 chronic kidney disease, or unspecified chronic kidney disease: Secondary | ICD-10-CM | POA: Diagnosis not present

## 2021-04-26 DIAGNOSIS — R142 Eructation: Secondary | ICD-10-CM | POA: Diagnosis not present

## 2021-04-26 DIAGNOSIS — R39198 Other difficulties with micturition: Secondary | ICD-10-CM | POA: Diagnosis not present

## 2021-04-26 DIAGNOSIS — D3610 Benign neoplasm of peripheral nerves and autonomic nervous system, unspecified: Secondary | ICD-10-CM | POA: Diagnosis not present

## 2021-04-26 DIAGNOSIS — Z20822 Contact with and (suspected) exposure to covid-19: Secondary | ICD-10-CM | POA: Diagnosis not present

## 2021-04-26 DIAGNOSIS — K1120 Sialoadenitis, unspecified: Secondary | ICD-10-CM | POA: Diagnosis not present

## 2021-04-26 DIAGNOSIS — M47812 Spondylosis without myelopathy or radiculopathy, cervical region: Secondary | ICD-10-CM | POA: Diagnosis not present

## 2021-04-26 DIAGNOSIS — R0602 Shortness of breath: Secondary | ICD-10-CM | POA: Diagnosis not present

## 2021-04-26 DIAGNOSIS — R109 Unspecified abdominal pain: Secondary | ICD-10-CM | POA: Diagnosis not present

## 2021-04-26 DIAGNOSIS — Z7901 Long term (current) use of anticoagulants: Secondary | ICD-10-CM | POA: Diagnosis not present

## 2021-04-26 DIAGNOSIS — R600 Localized edema: Secondary | ICD-10-CM | POA: Diagnosis not present

## 2021-04-26 DIAGNOSIS — R059 Cough, unspecified: Secondary | ICD-10-CM | POA: Diagnosis not present

## 2021-04-26 DIAGNOSIS — R0982 Postnasal drip: Secondary | ICD-10-CM | POA: Diagnosis not present

## 2021-04-26 DIAGNOSIS — Z944 Liver transplant status: Secondary | ICD-10-CM | POA: Diagnosis not present

## 2021-04-26 DIAGNOSIS — R7401 Elevation of levels of liver transaminase levels: Secondary | ICD-10-CM | POA: Diagnosis not present

## 2021-04-26 DIAGNOSIS — R202 Paresthesia of skin: Secondary | ICD-10-CM | POA: Diagnosis not present

## 2021-04-26 DIAGNOSIS — I160 Hypertensive urgency: Secondary | ICD-10-CM | POA: Diagnosis not present

## 2021-04-26 DIAGNOSIS — R531 Weakness: Secondary | ICD-10-CM | POA: Diagnosis not present

## 2021-04-26 DIAGNOSIS — R220 Localized swelling, mass and lump, head: Secondary | ICD-10-CM | POA: Insufficient documentation

## 2021-04-26 DIAGNOSIS — M4802 Spinal stenosis, cervical region: Secondary | ICD-10-CM | POA: Diagnosis not present

## 2021-04-26 DIAGNOSIS — R61 Generalized hyperhidrosis: Secondary | ICD-10-CM | POA: Diagnosis not present

## 2021-04-26 DIAGNOSIS — D849 Immunodeficiency, unspecified: Secondary | ICD-10-CM | POA: Diagnosis not present

## 2021-04-26 DIAGNOSIS — Z8619 Personal history of other infectious and parasitic diseases: Secondary | ICD-10-CM | POA: Diagnosis not present

## 2021-04-26 DIAGNOSIS — K529 Noninfective gastroenteritis and colitis, unspecified: Secondary | ICD-10-CM | POA: Diagnosis not present

## 2021-04-26 DIAGNOSIS — R197 Diarrhea, unspecified: Secondary | ICD-10-CM | POA: Diagnosis not present

## 2021-04-26 DIAGNOSIS — R112 Nausea with vomiting, unspecified: Secondary | ICD-10-CM | POA: Diagnosis not present

## 2021-04-26 DIAGNOSIS — R0600 Dyspnea, unspecified: Secondary | ICD-10-CM | POA: Diagnosis not present

## 2021-04-26 DIAGNOSIS — R42 Dizziness and giddiness: Secondary | ICD-10-CM | POA: Diagnosis not present

## 2021-04-26 DIAGNOSIS — R200 Anesthesia of skin: Secondary | ICD-10-CM | POA: Diagnosis not present

## 2021-04-27 ENCOUNTER — Ambulatory Visit: Payer: Medicare Other | Admitting: Internal Medicine

## 2021-04-27 DIAGNOSIS — Z944 Liver transplant status: Secondary | ICD-10-CM | POA: Diagnosis not present

## 2021-04-27 DIAGNOSIS — R220 Localized swelling, mass and lump, head: Secondary | ICD-10-CM | POA: Diagnosis not present

## 2021-04-27 DIAGNOSIS — R531 Weakness: Secondary | ICD-10-CM | POA: Diagnosis not present

## 2021-04-27 DIAGNOSIS — R202 Paresthesia of skin: Secondary | ICD-10-CM | POA: Diagnosis not present

## 2021-04-27 DIAGNOSIS — R0600 Dyspnea, unspecified: Secondary | ICD-10-CM | POA: Diagnosis not present

## 2021-04-27 DIAGNOSIS — M4802 Spinal stenosis, cervical region: Secondary | ICD-10-CM | POA: Diagnosis not present

## 2021-04-27 DIAGNOSIS — D849 Immunodeficiency, unspecified: Secondary | ICD-10-CM | POA: Diagnosis not present

## 2021-04-27 DIAGNOSIS — K1120 Sialoadenitis, unspecified: Secondary | ICD-10-CM | POA: Diagnosis not present

## 2021-04-27 DIAGNOSIS — R112 Nausea with vomiting, unspecified: Secondary | ICD-10-CM | POA: Diagnosis not present

## 2021-04-27 DIAGNOSIS — M47812 Spondylosis without myelopathy or radiculopathy, cervical region: Secondary | ICD-10-CM | POA: Diagnosis not present

## 2021-04-27 DIAGNOSIS — H538 Other visual disturbances: Secondary | ICD-10-CM | POA: Diagnosis not present

## 2021-04-28 DIAGNOSIS — R112 Nausea with vomiting, unspecified: Secondary | ICD-10-CM | POA: Diagnosis not present

## 2021-04-28 DIAGNOSIS — D849 Immunodeficiency, unspecified: Secondary | ICD-10-CM | POA: Diagnosis not present

## 2021-04-28 DIAGNOSIS — R202 Paresthesia of skin: Secondary | ICD-10-CM | POA: Diagnosis not present

## 2021-04-28 DIAGNOSIS — K1120 Sialoadenitis, unspecified: Secondary | ICD-10-CM | POA: Diagnosis not present

## 2021-04-28 DIAGNOSIS — R0600 Dyspnea, unspecified: Secondary | ICD-10-CM | POA: Diagnosis not present

## 2021-04-28 DIAGNOSIS — R531 Weakness: Secondary | ICD-10-CM | POA: Diagnosis not present

## 2021-04-28 DIAGNOSIS — Z944 Liver transplant status: Secondary | ICD-10-CM | POA: Diagnosis not present

## 2021-04-28 DIAGNOSIS — R220 Localized swelling, mass and lump, head: Secondary | ICD-10-CM | POA: Diagnosis not present

## 2021-04-29 DIAGNOSIS — H5712 Ocular pain, left eye: Secondary | ICD-10-CM | POA: Diagnosis not present

## 2021-04-29 DIAGNOSIS — R220 Localized swelling, mass and lump, head: Secondary | ICD-10-CM | POA: Diagnosis not present

## 2021-04-29 DIAGNOSIS — R0600 Dyspnea, unspecified: Secondary | ICD-10-CM | POA: Diagnosis not present

## 2021-04-29 DIAGNOSIS — R202 Paresthesia of skin: Secondary | ICD-10-CM | POA: Diagnosis not present

## 2021-04-29 DIAGNOSIS — R112 Nausea with vomiting, unspecified: Secondary | ICD-10-CM | POA: Diagnosis not present

## 2021-04-29 DIAGNOSIS — Z944 Liver transplant status: Secondary | ICD-10-CM | POA: Diagnosis not present

## 2021-04-30 DIAGNOSIS — D849 Immunodeficiency, unspecified: Secondary | ICD-10-CM | POA: Diagnosis not present

## 2021-04-30 DIAGNOSIS — R0600 Dyspnea, unspecified: Secondary | ICD-10-CM | POA: Diagnosis not present

## 2021-04-30 DIAGNOSIS — Z944 Liver transplant status: Secondary | ICD-10-CM | POA: Diagnosis not present

## 2021-04-30 DIAGNOSIS — R112 Nausea with vomiting, unspecified: Secondary | ICD-10-CM | POA: Diagnosis not present

## 2021-04-30 DIAGNOSIS — H04123 Dry eye syndrome of bilateral lacrimal glands: Secondary | ICD-10-CM | POA: Diagnosis not present

## 2021-04-30 DIAGNOSIS — R220 Localized swelling, mass and lump, head: Secondary | ICD-10-CM | POA: Diagnosis not present

## 2021-04-30 DIAGNOSIS — R202 Paresthesia of skin: Secondary | ICD-10-CM | POA: Diagnosis not present

## 2021-05-01 DIAGNOSIS — R0600 Dyspnea, unspecified: Secondary | ICD-10-CM | POA: Diagnosis not present

## 2021-05-01 DIAGNOSIS — R220 Localized swelling, mass and lump, head: Secondary | ICD-10-CM | POA: Diagnosis not present

## 2021-05-01 DIAGNOSIS — Z944 Liver transplant status: Secondary | ICD-10-CM | POA: Diagnosis not present

## 2021-05-01 DIAGNOSIS — R202 Paresthesia of skin: Secondary | ICD-10-CM | POA: Diagnosis not present

## 2021-05-01 DIAGNOSIS — R112 Nausea with vomiting, unspecified: Secondary | ICD-10-CM | POA: Diagnosis not present

## 2021-05-02 ENCOUNTER — Ambulatory Visit (INDEPENDENT_AMBULATORY_CARE_PROVIDER_SITE_OTHER): Payer: Medicare Other | Admitting: Internal Medicine

## 2021-05-02 ENCOUNTER — Other Ambulatory Visit: Payer: Self-pay

## 2021-05-02 ENCOUNTER — Encounter: Payer: Self-pay | Admitting: Internal Medicine

## 2021-05-02 VITALS — BP 100/70 | HR 84 | Temp 97.3°F | Ht 72.75 in | Wt 267.0 lb

## 2021-05-02 DIAGNOSIS — N1832 Chronic kidney disease, stage 3b: Secondary | ICD-10-CM

## 2021-05-02 DIAGNOSIS — Z23 Encounter for immunization: Secondary | ICD-10-CM | POA: Diagnosis not present

## 2021-05-02 DIAGNOSIS — I10 Essential (primary) hypertension: Secondary | ICD-10-CM

## 2021-05-02 DIAGNOSIS — M1A9XX1 Chronic gout, unspecified, with tophus (tophi): Secondary | ICD-10-CM | POA: Diagnosis not present

## 2021-05-02 DIAGNOSIS — K7210 Chronic hepatic failure without coma: Secondary | ICD-10-CM | POA: Diagnosis not present

## 2021-05-02 DIAGNOSIS — Z0000 Encounter for general adult medical examination without abnormal findings: Secondary | ICD-10-CM

## 2021-05-02 DIAGNOSIS — D849 Immunodeficiency, unspecified: Secondary | ICD-10-CM

## 2021-05-02 MED ORDER — COLCHICINE 0.6 MG PO TABS: 0.6000 mg | ORAL_TABLET | Freq: Every day | ORAL | 3 refills | Status: DC

## 2021-05-02 NOTE — Addendum Note (Signed)
Addended by: Pilar Grammes on: 05/02/2021 01:12 PM   Modules accepted: Orders

## 2021-05-02 NOTE — Assessment & Plan Note (Signed)
Last GFR 39 Stable over a year Asked him to take the low dose lisinopril

## 2021-05-02 NOTE — Progress Notes (Signed)
Hearing Screening - Comments:: Passed whisper test Vision Screening - Comments:: November 2022  

## 2021-05-02 NOTE — Progress Notes (Signed)
Subjective:    Patient ID: Devin White, male    DOB: 1956/05/05, 65 y.o.   MRN: [id number]  HPI Here for Medicare wellness visit and hospital follow up This visit occurred during the SARS-CoV-2 public health emergency.  Safety protocols were in place, including screening questions prior to the visit, additional usage of staff PPE, and extensive cleaning of exam room while observing appropriate contact time as indicated for disinfecting solutions.   Reviewed advanced directives Reviewed other doctors--[facility] team, Dr Wynonia Hazard ([facility]), [facility], [facility], [facility] Multiple hospitalizations for gout and appendectomy Had EGD and colonoscopy as well Vision in left eye not good--may need cataract surgery Hearing is fine No alcohol or tobacco Fell once--no real injury (was in shower) Stays active with yard work--no set exercise No depression or anhedonia-did get to the beach this summer Independent with instrumental ADLS No sig memory issues  Had swelling in lymph nodes--so went to [facility] on right side of face---neurologic exams and imaging were reassuring Some liver enzymes were elevated No worrisome findings BP did go up to 409'W systolic Carvedilol added but he hasn't started it Also not taking lisinopril---discussed that it would be good to take that unless he goes too low or dizziness  Did see rheumatologist---allopurinol increased to 300mg  Still on the colchicine daily Still occasional flares--he is to take prednisone burst for those  Current Outpatient Medications on File Prior to Visit  Medication Sig Dispense Refill   allopurinol (ZYLOPRIM) 300 MG tablet Take 300 mg by mouth daily.     calcium carbonate (TUMS) 500 MG chewable tablet Chew 1 tablet (200 mg of elemental calcium total) by mouth 2 (two) times daily as needed for indigestion or heartburn. 180 tablet 1   colchicine 0.6 MG tablet Take 1 tablet by mouth daily.      diclofenac Sodium (VOLTAREN) 1 % GEL Apply topically.     ELIQUIS 5 MG TABS tablet Take 5 mg by mouth 2 (two) times daily.     lisinopril (ZESTRIL) 5 MG tablet Take by mouth.     mycophenolate (CELLCEPT) 250 MG capsule Take 250-500 mg by mouth 2 (two) times daily. 2 capsules (500 mg) in the morning and one capsule (250 mg) in the evening     omeprazole (PRILOSEC) 20 MG capsule TAKE 1 CAPSULE BY MOUTH TWICE DAILY 180 capsule 3   ondansetron (ZOFRAN ODT) 4 MG disintegrating tablet Take 1 tablet (4 mg total) by mouth every 8 (eight) hours as needed. 20 tablet 0   predniSONE (DELTASONE) 5 MG tablet Take 5 mg by mouth daily with breakfast.     tacrolimus (PROGRAF) 0.5 MG capsule Take 0.5-1 mg by mouth See admin instructions. Take 2 capsules (1mg ) by mouth every morning and 1 capsule (0.5mg ) by mouth every night     tamsulosin (FLOMAX) 0.4 MG CAPS capsule Take 1 capsule (0.4 mg total) by mouth daily. 90 capsule 3   No current facility-administered medications on file prior to visit.    No Known Allergies  Past Medical History:  Diagnosis Date   Arthritis    back and legs   Benign prostatic hypertrophy    GERD (gastroesophageal reflux disease)    Gout    History of blood transfusion    pt has antibodies in his blood since previous transfusions   History of cirrhosis of liver S/P TRANSPLANT 2009   History of liver failure S/P TRANSPLANT   Hypertension    Left ureteral calculus  Past Surgical History:  Procedure Laterality Date   APPENDECTOMY     bone morrow biopsy     CHOLECYSTECTOMY  2007   CYSTO/ LEFT RETROGRADE PYELOGRAM/ LEFT URETERAL STENT PLACEMENT  03-05-2012  DR Tresa Moore [facility])   LEFT URETERAL CALCULI   CYSTOSCOPY W/ URETERAL STENT PLACEMENT  03/11/2012   Procedure: CYSTOSCOPY WITH STENT REPLACEMENT;  Surgeon: Alexis Frock, MD;  Location: [facility];  Service: Urology;  Laterality: Left;   HERNIA REPAIR  2008   LIVER BIOPSY     LIVER TRANSPLANT  11/24/2007    pt states doing well since liver transplant   LUMBAR DISC SURGERY     LUMBAR FUSION     removal of fistula     URETEROSCOPY  03/11/2012   Procedure: URETEROSCOPY;  Surgeon: Alexis Frock, MD;  Location: [facility];  Service: Urology;  Laterality: Left;   STONE MANIPULATION, stone obtained  [facility]    Family History  Problem Relation Age of Onset   Cancer Mother        colon   Arthritis Mother    Vasculitis Mother    Hypertension Mother    Cancer Father        colon   Kidney disease Father    Arthritis Father    Arthritis Brother    Alcohol abuse Maternal Aunt    Diabetes Maternal Aunt    Alcohol abuse Maternal Uncle    Diabetes Paternal Aunt    Alcohol abuse Maternal Uncle    Alcohol abuse Maternal Uncle     Social History   Socioeconomic History   Marital status: Single    Spouse name: Not on file   Number of children: 3   Years of education: Not on file   Highest education level: Not on file  Occupational History   Occupation: disabled    Employer: RETIRED  Tobacco Use   Smoking status: Never   Smokeless tobacco: Never  Substance and Sexual Activity   Alcohol use: No   Drug use: No   Sexual activity: Not on file  Other Topics Concern   Not on file  Social History Narrative   Has living will   Requests daughter [name] as his health care POA   Would accept brief attempt at resuscitation but no prolonged ventilation   No tube feeds if cognitively unaware   Social Determinants of Health   Financial Resource Strain: Not on file  Food Insecurity: Not on file  Transportation Needs: Not on file  Physical Activity: Not on file  Stress: Not on file  Social Connections: Not on file  Intimate Partner Violence: Not on file   Review of Systems Chronically feels fatigued for the past 2 months Appetite is off in the past few days (in hospital) Sleep is variable Nocturia x 1. Flow is okay Teeth okay---overdue for dentist (needs to  establish) Still has some headaches--severe on left side. Going to headache specialist soon On omeprazole--but still has some substernal discomfort. No dysphagia No other joint pains other than the gout No suspicious skin lesions now.  Widespread bruising since in hospital (blood draws, etc)    Objective:   Physical Exam Constitutional:      Appearance: Normal appearance.  HENT:     Mouth/Throat:     Comments: No lesions Eyes:     Conjunctiva/sclera: Conjunctivae normal.     Pupils: Pupils are equal, round, and reactive to light.  Neck:     Comments: 1  small right posterior cervical node (vs cyst) No other nodes Cardiovascular:     Rate and Rhythm: Normal rate and regular rhythm.     Pulses: Normal pulses.     Heart sounds: No murmur heard.   No gallop.  Pulmonary:     Effort: Pulmonary effort is normal.     Breath sounds: Normal breath sounds. No wheezing or rales.  Abdominal:     Palpations: Abdomen is soft.     Tenderness: There is no abdominal tenderness.  Musculoskeletal:     Cervical back: Neck supple.     Right lower leg: No edema.     Left lower leg: No edema.  Skin:    Findings: No lesion or rash.  Neurological:     Mental Status: He is alert and oriented to person, place, and time.     Comments: President---"Biden, Trump, Clinton----Bush?" [id number] Recall 3/3  Psychiatric:        Mood and Affect: Mood normal.        Behavior: Behavior normal.           Assessment & Plan:

## 2021-05-02 NOTE — Assessment & Plan Note (Signed)
BP Readings from Last 3 Encounters:  05/02/21 100/70  04/07/21 (!) 142/114  12/12/20 122/80   Runs high in the hospital--then tends to be low and he passes out Asked him to take the lisinopril but will not use the carvedilol

## 2021-05-02 NOTE — Assessment & Plan Note (Signed)
[facility] is adjusting the tacrolimus

## 2021-05-02 NOTE — Assessment & Plan Note (Signed)
Fewer flares on the higher allopurinol Colchicine/prednisone burst for flares

## 2021-05-02 NOTE — Assessment & Plan Note (Signed)
I have personally reviewed the Medicare Annual Wellness questionnaire and have noted 1. The patient's medical and social history 2. Their use of alcohol, tobacco or illicit drugs 3. Their current medications and supplements 4. The patient's functional ability including ADL's, fall risks, home safety risks and hearing or visual             impairment. 5. Diet and physical activities 6. Evidence for depression or mood disorders  The patients weight, height, BMI and visual acuity have been recorded in the chart I have made referrals, counseling and provided education to the patient based review of the above and I have provided the pt with a written personalized care plan for preventive services.  I have provided you with a copy of your personalized plan for preventive services. Please take the time to review along with your updated medication list.  Had colonoscopy in April--told he needs repeat in a year due to polyps Still prefers no PSA Discussed exercise Flu vaccine today shingrix when covered He prefers no COVID vaccine

## 2021-05-02 NOTE — Assessment & Plan Note (Signed)
Had transplant On prograf and prednisone Some worsening with this hospitalization but better again

## 2021-05-04 DIAGNOSIS — D849 Immunodeficiency, unspecified: Secondary | ICD-10-CM | POA: Diagnosis not present

## 2021-05-04 DIAGNOSIS — Z944 Liver transplant status: Secondary | ICD-10-CM | POA: Diagnosis not present

## 2021-05-08 DIAGNOSIS — B259 Cytomegaloviral disease, unspecified: Secondary | ICD-10-CM | POA: Diagnosis not present

## 2021-05-08 DIAGNOSIS — Z944 Liver transplant status: Secondary | ICD-10-CM | POA: Diagnosis not present

## 2021-05-08 DIAGNOSIS — R197 Diarrhea, unspecified: Secondary | ICD-10-CM | POA: Diagnosis not present

## 2021-05-08 DIAGNOSIS — D849 Immunodeficiency, unspecified: Secondary | ICD-10-CM | POA: Diagnosis not present

## 2021-05-14 DIAGNOSIS — D72828 Other elevated white blood cell count: Secondary | ICD-10-CM | POA: Diagnosis not present

## 2021-05-14 DIAGNOSIS — Z20822 Contact with and (suspected) exposure to covid-19: Secondary | ICD-10-CM | POA: Diagnosis not present

## 2021-05-14 DIAGNOSIS — R109 Unspecified abdominal pain: Secondary | ICD-10-CM | POA: Diagnosis not present

## 2021-05-14 DIAGNOSIS — R5382 Chronic fatigue, unspecified: Secondary | ICD-10-CM | POA: Diagnosis not present

## 2021-05-14 DIAGNOSIS — D72829 Elevated white blood cell count, unspecified: Secondary | ICD-10-CM | POA: Diagnosis not present

## 2021-05-14 DIAGNOSIS — R1031 Right lower quadrant pain: Secondary | ICD-10-CM | POA: Diagnosis not present

## 2021-05-14 DIAGNOSIS — R112 Nausea with vomiting, unspecified: Secondary | ICD-10-CM | POA: Diagnosis not present

## 2021-05-14 DIAGNOSIS — R0602 Shortness of breath: Secondary | ICD-10-CM | POA: Diagnosis not present

## 2021-05-14 DIAGNOSIS — R519 Headache, unspecified: Secondary | ICD-10-CM | POA: Diagnosis not present

## 2021-05-14 DIAGNOSIS — Z7901 Long term (current) use of anticoagulants: Secondary | ICD-10-CM | POA: Diagnosis not present

## 2021-05-14 DIAGNOSIS — Z87442 Personal history of urinary calculi: Secondary | ICD-10-CM | POA: Diagnosis not present

## 2021-05-14 DIAGNOSIS — R42 Dizziness and giddiness: Secondary | ICD-10-CM | POA: Diagnosis not present

## 2021-05-14 DIAGNOSIS — K55059 Acute (reversible) ischemia of intestine, part and extent unspecified: Secondary | ICD-10-CM | POA: Diagnosis not present

## 2021-05-14 DIAGNOSIS — Z944 Liver transplant status: Secondary | ICD-10-CM | POA: Diagnosis not present

## 2021-05-14 DIAGNOSIS — K55069 Acute infarction of intestine, part and extent unspecified: Secondary | ICD-10-CM | POA: Diagnosis not present

## 2021-05-14 DIAGNOSIS — R197 Diarrhea, unspecified: Secondary | ICD-10-CM | POA: Diagnosis not present

## 2021-05-14 DIAGNOSIS — R000 Tachycardia, unspecified: Secondary | ICD-10-CM | POA: Diagnosis not present

## 2021-05-15 ENCOUNTER — Ambulatory Visit: Payer: Medicare Other | Admitting: Internal Medicine

## 2021-05-15 DIAGNOSIS — Z944 Liver transplant status: Secondary | ICD-10-CM | POA: Diagnosis not present

## 2021-05-15 DIAGNOSIS — R109 Unspecified abdominal pain: Secondary | ICD-10-CM | POA: Diagnosis not present

## 2021-05-15 DIAGNOSIS — K55069 Acute infarction of intestine, part and extent unspecified: Secondary | ICD-10-CM | POA: Diagnosis not present

## 2021-05-16 ENCOUNTER — Ambulatory Visit: Payer: Medicare Other | Admitting: Internal Medicine

## 2021-05-24 ENCOUNTER — Other Ambulatory Visit: Payer: Self-pay | Admitting: Internal Medicine

## 2021-05-24 DIAGNOSIS — D849 Immunodeficiency, unspecified: Secondary | ICD-10-CM | POA: Diagnosis not present

## 2021-05-24 DIAGNOSIS — Z298 Encounter for other specified prophylactic measures: Secondary | ICD-10-CM | POA: Diagnosis not present

## 2021-05-24 DIAGNOSIS — M1A9XX1 Chronic gout, unspecified, with tophus (tophi): Secondary | ICD-10-CM | POA: Diagnosis not present

## 2021-05-24 DIAGNOSIS — I129 Hypertensive chronic kidney disease with stage 1 through stage 4 chronic kidney disease, or unspecified chronic kidney disease: Secondary | ICD-10-CM | POA: Diagnosis not present

## 2021-05-24 DIAGNOSIS — N183 Chronic kidney disease, stage 3 unspecified: Secondary | ICD-10-CM | POA: Diagnosis not present

## 2021-05-24 DIAGNOSIS — I10 Essential (primary) hypertension: Secondary | ICD-10-CM | POA: Diagnosis not present

## 2021-05-24 DIAGNOSIS — Z944 Liver transplant status: Secondary | ICD-10-CM | POA: Diagnosis not present

## 2021-05-24 DIAGNOSIS — Z79899 Other long term (current) drug therapy: Secondary | ICD-10-CM | POA: Diagnosis not present

## 2021-05-25 MED ORDER — DICLOFENAC SODIUM 1 % EX GEL: 4.0000 g | Freq: Four times a day (QID) | CUTANEOUS | 5 refills | Status: DC

## 2021-05-29 DIAGNOSIS — Z86718 Personal history of other venous thrombosis and embolism: Secondary | ICD-10-CM | POA: Diagnosis not present

## 2021-05-29 DIAGNOSIS — R9389 Abnormal findings on diagnostic imaging of other specified body structures: Secondary | ICD-10-CM | POA: Diagnosis not present

## 2021-06-04 DIAGNOSIS — I130 Hypertensive heart and chronic kidney disease with heart failure and stage 1 through stage 4 chronic kidney disease, or unspecified chronic kidney disease: Secondary | ICD-10-CM | POA: Diagnosis not present

## 2021-06-04 DIAGNOSIS — R0600 Dyspnea, unspecified: Secondary | ICD-10-CM | POA: Diagnosis not present

## 2021-06-04 DIAGNOSIS — Z20822 Contact with and (suspected) exposure to covid-19: Secondary | ICD-10-CM | POA: Diagnosis not present

## 2021-06-04 DIAGNOSIS — Z944 Liver transplant status: Secondary | ICD-10-CM | POA: Diagnosis not present

## 2021-06-04 DIAGNOSIS — N183 Chronic kidney disease, stage 3 unspecified: Secondary | ICD-10-CM | POA: Diagnosis not present

## 2021-06-04 DIAGNOSIS — I208 Other forms of angina pectoris: Secondary | ICD-10-CM | POA: Diagnosis not present

## 2021-06-04 DIAGNOSIS — M1A9XX1 Chronic gout, unspecified, with tophus (tophi): Secondary | ICD-10-CM | POA: Diagnosis not present

## 2021-06-04 DIAGNOSIS — Z7901 Long term (current) use of anticoagulants: Secondary | ICD-10-CM | POA: Diagnosis not present

## 2021-06-04 DIAGNOSIS — R21 Rash and other nonspecific skin eruption: Secondary | ICD-10-CM | POA: Diagnosis not present

## 2021-06-04 DIAGNOSIS — F17200 Nicotine dependence, unspecified, uncomplicated: Secondary | ICD-10-CM | POA: Diagnosis not present

## 2021-06-04 DIAGNOSIS — Z86718 Personal history of other venous thrombosis and embolism: Secondary | ICD-10-CM | POA: Diagnosis not present

## 2021-06-04 DIAGNOSIS — I5030 Unspecified diastolic (congestive) heart failure: Secondary | ICD-10-CM | POA: Diagnosis not present

## 2021-06-04 DIAGNOSIS — D696 Thrombocytopenia, unspecified: Secondary | ICD-10-CM | POA: Diagnosis not present

## 2021-06-04 DIAGNOSIS — M71122 Other infective bursitis, left elbow: Secondary | ICD-10-CM | POA: Diagnosis not present

## 2021-06-04 DIAGNOSIS — M25522 Pain in left elbow: Secondary | ICD-10-CM | POA: Diagnosis not present

## 2021-06-04 DIAGNOSIS — D84821 Immunodeficiency due to drugs: Secondary | ICD-10-CM | POA: Diagnosis not present

## 2021-06-04 DIAGNOSIS — D849 Immunodeficiency, unspecified: Secondary | ICD-10-CM | POA: Diagnosis not present

## 2021-06-04 DIAGNOSIS — K529 Noninfective gastroenteritis and colitis, unspecified: Secondary | ICD-10-CM | POA: Diagnosis not present

## 2021-06-04 DIAGNOSIS — K754 Autoimmune hepatitis: Secondary | ICD-10-CM | POA: Diagnosis not present

## 2021-06-04 DIAGNOSIS — Z79621 Long term (current) use of calcineurin inhibitor: Secondary | ICD-10-CM | POA: Diagnosis not present

## 2021-06-04 DIAGNOSIS — Z9049 Acquired absence of other specified parts of digestive tract: Secondary | ICD-10-CM | POA: Diagnosis not present

## 2021-06-04 DIAGNOSIS — K55069 Acute infarction of intestine, part and extent unspecified: Secondary | ICD-10-CM | POA: Diagnosis not present

## 2021-06-04 DIAGNOSIS — Z79624 Long term (current) use of inhibitors of nucleotide synthesis: Secondary | ICD-10-CM | POA: Diagnosis not present

## 2021-06-04 DIAGNOSIS — J811 Chronic pulmonary edema: Secondary | ICD-10-CM | POA: Diagnosis not present

## 2021-06-04 DIAGNOSIS — R1011 Right upper quadrant pain: Secondary | ICD-10-CM | POA: Diagnosis not present

## 2021-06-04 DIAGNOSIS — D72829 Elevated white blood cell count, unspecified: Secondary | ICD-10-CM | POA: Diagnosis not present

## 2021-06-04 DIAGNOSIS — Z8719 Personal history of other diseases of the digestive system: Secondary | ICD-10-CM | POA: Diagnosis not present

## 2021-06-04 DIAGNOSIS — K31A0 Gastric intestinal metaplasia, unspecified: Secondary | ICD-10-CM | POA: Diagnosis not present

## 2021-06-04 DIAGNOSIS — N179 Acute kidney failure, unspecified: Secondary | ICD-10-CM | POA: Diagnosis not present

## 2021-06-04 DIAGNOSIS — L03114 Cellulitis of left upper limb: Secondary | ICD-10-CM | POA: Diagnosis not present

## 2021-06-04 DIAGNOSIS — R197 Diarrhea, unspecified: Secondary | ICD-10-CM | POA: Diagnosis not present

## 2021-06-04 DIAGNOSIS — K219 Gastro-esophageal reflux disease without esophagitis: Secondary | ICD-10-CM | POA: Diagnosis not present

## 2021-06-04 DIAGNOSIS — M7022 Olecranon bursitis, left elbow: Secondary | ICD-10-CM | POA: Diagnosis not present

## 2021-06-04 DIAGNOSIS — M47814 Spondylosis without myelopathy or radiculopathy, thoracic region: Secondary | ICD-10-CM | POA: Diagnosis not present

## 2021-06-04 DIAGNOSIS — Z87442 Personal history of urinary calculi: Secondary | ICD-10-CM | POA: Diagnosis not present

## 2021-06-04 DIAGNOSIS — Z79899 Other long term (current) drug therapy: Secondary | ICD-10-CM | POA: Diagnosis not present

## 2021-06-04 DIAGNOSIS — Z7952 Long term (current) use of systemic steroids: Secondary | ICD-10-CM | POA: Diagnosis not present

## 2021-06-04 DIAGNOSIS — D72825 Bandemia: Secondary | ICD-10-CM | POA: Diagnosis not present

## 2021-06-05 DIAGNOSIS — D72825 Bandemia: Secondary | ICD-10-CM | POA: Diagnosis not present

## 2021-06-05 DIAGNOSIS — R0600 Dyspnea, unspecified: Secondary | ICD-10-CM | POA: Diagnosis not present

## 2021-06-05 DIAGNOSIS — M7022 Olecranon bursitis, left elbow: Secondary | ICD-10-CM | POA: Diagnosis not present

## 2021-06-05 DIAGNOSIS — M25522 Pain in left elbow: Secondary | ICD-10-CM | POA: Diagnosis not present

## 2021-06-06 DIAGNOSIS — N183 Chronic kidney disease, stage 3 unspecified: Secondary | ICD-10-CM | POA: Diagnosis not present

## 2021-06-06 DIAGNOSIS — D72825 Bandemia: Secondary | ICD-10-CM | POA: Diagnosis not present

## 2021-06-06 DIAGNOSIS — Z944 Liver transplant status: Secondary | ICD-10-CM | POA: Diagnosis not present

## 2021-06-06 DIAGNOSIS — K55069 Acute infarction of intestine, part and extent unspecified: Secondary | ICD-10-CM | POA: Diagnosis not present

## 2021-06-06 DIAGNOSIS — L03114 Cellulitis of left upper limb: Secondary | ICD-10-CM | POA: Diagnosis not present

## 2021-06-06 DIAGNOSIS — R197 Diarrhea, unspecified: Secondary | ICD-10-CM | POA: Diagnosis not present

## 2021-06-06 DIAGNOSIS — M7022 Olecranon bursitis, left elbow: Secondary | ICD-10-CM | POA: Diagnosis not present

## 2021-06-06 DIAGNOSIS — M1A9XX1 Chronic gout, unspecified, with tophus (tophi): Secondary | ICD-10-CM | POA: Diagnosis not present

## 2021-06-06 DIAGNOSIS — M25522 Pain in left elbow: Secondary | ICD-10-CM | POA: Diagnosis not present

## 2021-06-06 DIAGNOSIS — K31A0 Gastric intestinal metaplasia, unspecified: Secondary | ICD-10-CM | POA: Diagnosis not present

## 2021-06-07 DIAGNOSIS — L03114 Cellulitis of left upper limb: Secondary | ICD-10-CM | POA: Diagnosis not present

## 2021-06-07 DIAGNOSIS — D72825 Bandemia: Secondary | ICD-10-CM | POA: Diagnosis not present

## 2021-06-07 DIAGNOSIS — R197 Diarrhea, unspecified: Secondary | ICD-10-CM | POA: Diagnosis not present

## 2021-06-07 DIAGNOSIS — N183 Chronic kidney disease, stage 3 unspecified: Secondary | ICD-10-CM | POA: Diagnosis not present

## 2021-06-07 DIAGNOSIS — K31A0 Gastric intestinal metaplasia, unspecified: Secondary | ICD-10-CM | POA: Diagnosis not present

## 2021-06-07 DIAGNOSIS — M25522 Pain in left elbow: Secondary | ICD-10-CM | POA: Diagnosis not present

## 2021-06-07 DIAGNOSIS — M7022 Olecranon bursitis, left elbow: Secondary | ICD-10-CM | POA: Diagnosis not present

## 2021-06-08 DIAGNOSIS — L03114 Cellulitis of left upper limb: Secondary | ICD-10-CM | POA: Diagnosis not present

## 2021-06-08 DIAGNOSIS — N183 Chronic kidney disease, stage 3 unspecified: Secondary | ICD-10-CM | POA: Diagnosis not present

## 2021-06-08 DIAGNOSIS — R197 Diarrhea, unspecified: Secondary | ICD-10-CM | POA: Diagnosis not present

## 2021-06-08 DIAGNOSIS — M25522 Pain in left elbow: Secondary | ICD-10-CM | POA: Diagnosis not present

## 2021-06-08 DIAGNOSIS — K31A0 Gastric intestinal metaplasia, unspecified: Secondary | ICD-10-CM | POA: Diagnosis not present

## 2021-06-13 ENCOUNTER — Telehealth: Payer: Self-pay

## 2021-06-13 NOTE — Telephone Encounter (Signed)
Spoke to pt. He said he is doing better but thinks it was in his other elbow now.

## 2021-06-14 DIAGNOSIS — M1A9XX1 Chronic gout, unspecified, with tophus (tophi): Secondary | ICD-10-CM | POA: Diagnosis not present

## 2021-06-28 DIAGNOSIS — M1A9XX1 Chronic gout, unspecified, with tophus (tophi): Secondary | ICD-10-CM | POA: Diagnosis not present

## 2021-07-04 ENCOUNTER — Other Ambulatory Visit: Payer: Self-pay

## 2021-07-04 ENCOUNTER — Ambulatory Visit (INDEPENDENT_AMBULATORY_CARE_PROVIDER_SITE_OTHER): Payer: Medicare Other | Admitting: Pharmacist

## 2021-07-04 DIAGNOSIS — N400 Enlarged prostate without lower urinary tract symptoms: Secondary | ICD-10-CM

## 2021-07-04 DIAGNOSIS — K219 Gastro-esophageal reflux disease without esophagitis: Secondary | ICD-10-CM

## 2021-07-04 DIAGNOSIS — I10 Essential (primary) hypertension: Secondary | ICD-10-CM

## 2021-07-04 DIAGNOSIS — Z944 Liver transplant status: Secondary | ICD-10-CM

## 2021-07-04 DIAGNOSIS — D849 Immunodeficiency, unspecified: Secondary | ICD-10-CM | POA: Diagnosis not present

## 2021-07-04 DIAGNOSIS — N1831 Chronic kidney disease, stage 3a: Secondary | ICD-10-CM

## 2021-07-04 DIAGNOSIS — M1000 Idiopathic gout, unspecified site: Secondary | ICD-10-CM

## 2021-07-04 NOTE — Progress Notes (Deleted)
error 

## 2021-07-04 NOTE — Progress Notes (Signed)
° °Chronic Care Management °Pharmacy Note ° °07/04/2021 °Name:  Devin White MRN:  [id number] DOB:  12/29/1955 ° °Summary: °-Pt endorses compliance with medications as prescribed; he denies missed doses of medication (particularly transplant medications) °-Pt is taking lisinopril 5 mg BID and reports BP at home has been elevated (180s/101); he has appt with [facility] cardiologist tomorrow (evaluation for heart failure vs ischemia in light of DOE and recent ECHO results) ° °Recommendations/Changes made from today's visit: °-Defer BP medication changes to cardiology appt 07/05/21; advised pt to bring BP monitor to appt ° °Plan: °-[facility] Concierge will call patient 2 months for general adherence °-Pharmacist follow up televisit scheduled for 6 months ° ° ° °Subjective: °Devin White is an 65 y.o. year old male who is a primary patient of Letvak, Richard I, MD.  The CCM team was consulted for assistance with disease management and care coordination needs.   ° °Engaged with patient by telephone for initial visit in response to provider referral for pharmacy case management and/or care coordination services.  ° °Consent to Services:  °The patient was given the following information about Chronic Care Management services today, agreed to services, and gave verbal consent: 1. CCM service includes personalized support from designated clinical staff supervised by the primary care provider, including individualized plan of care and coordination with other care providers 2. 24/7 contact phone numbers for assistance for urgent and routine care needs. 3. Service will only be billed when office clinical staff spend 20 minutes or more in a month to coordinate care. 4. Only one practitioner may furnish and bill the service in a calendar month. 5.The patient may stop CCM services at any time (effective at the end of the month) by phone call to the office staff. 6. The patient will be responsible for cost sharing (co-pay) of up to  20% of the service fee (after annual deductible is met). Patient agreed to services and consent obtained. ° °Patient Care Team: °Letvak, Richard I, MD as PCP - General °,  N, RPH as Pharmacist (Pharmacist) ° ° °Recent office visits: °05/02/21 Dr Letvak OV: AWV and hospital f/u - pt did not start carvedilol, amlodipine or lisinopril. Advised to start lisinopril for BP and kidneys. ° °12/12/20 Letvak, Richard I, MD (PCP): Chronic tophaceous gout . D/C hydralazine, lisinopril, oxycodone (pt no longer taking) ° °Recent consult visits: °06/04/21 Dr Ortel ([facility]): f/u VTE; stop Eliquis; f/u PRN. Go to ED for elevated WBC and elbow ° °05/24/21 Dr Shen (rheumatology): f/u gout; current flare; he has flares twice a month; Restart colchicine 0.6 mg QOD; given Depo-Medrol for flare; Continue allopurinol until Krystexxa started ° °05/14/21 PA Serena Eldridge ([facility]): f/u mesenteric vein thrombosis. Pt became SOB, diaphoretic, emetic in office. Advised to go to ED. ° °04/01/21 Dr Evans ([facility]): new consult -f/u AKI in ED. labs today; f/u 4 months. ° °02/22/21 Dr Shen (rheumatology): f/u gout. Increase allopurinol to 300 mg. Rx'd prednisone taper 60 mg. Advised not to increase colchicine during flare. °02/22/21 Dr Kheterpal (dermatology): skin check, benign. °01/01/21 Dr Brady ([facility])- f/u Liver transplant. Maintain prednisone w/o taper in light of recent medical problems. Referred to dermatology. Refer to nephrology. Advised covid and shingrix vaccines. °12/29/20 PA Eldridge ([facility]) - f/u Mesenteric vein thrombosis. VTE provoked in s/o appendectomy surgery. Plan 3 months of Eliquis. ° °Hospital visits: °Medication Reconciliation was completed by comparing discharge summary, patient’s EMR and Pharmacy list, and upon discussion with patient. ° °Admitted   to the hospital on 06/04/21 due to Septic bursitis. Discharge date was 06/08/21. Discharged from [facility].    °-treated with vanc/cefepime, new exertional dyspnea concerning for HFpEF vs stable angina °-Rec OP stress test, referred to cardiology °-Consider OP sleep study given high risk STOP-BANG °-Pt considering to change PCP to [facility] system for ease ° °New?Medications Started at Hospital Discharge:?? °-started Bactrim and Keflex due to bursitis (EOT 06/11/21) °-Start Lasix 40 mg daily PRN °-Start Amlodipine 10 mg daily °-Start carvedilol 6.25 mg BID ° °Medication Changes at Hospital Discharge: °-Changed lisinopril from 5 to 10 mg daily ° °Medications that remain the same after Hospital Discharge:??  °-All other medications will remain the same.   ° °Admitted to the ED on 05/14/21 due to SOB, diaphoresis. Discharge date was 05/15/21. Discharged from [facility].   °-CT, UA, urine culture, blood cultures ordered. Discharged home. ° °Medications that remain the same after Hospital Discharge:??  °-All other medications will remain the same.   ° °Admitted to the hospital on 04/26/21 due to Elevated LFTs, N/V, facial swelling. Discharge date was 05/01/21. Discharged from [facility].   °-LFTs/parotid enlargement likely 2/2 viral infection vs ischemic hepatitis from dehydration ° °New?Medications Started at Hospital Discharge:?? °-started Amlodipine 10 mg daily °-started carvedilol 6.25 mg BID °-started lisinopril 5 mg daily °-started Systane ophthalmic ointment ° °Medication Changes at Hospital Discharge: °-Changed tacrolimus - reduce to 0.5 mg BID ° °Medications that remain the same after Hospital Discharge:??  °-All other medications will remain the same.   ° °Admitted to the ED on 04/07/21 due to Diarrhea, paresthesias. Discharge date was 04/07/21. Discharged from [facility].   °-CT head/neck, MRI brain normal. °-RUQ US normal. CXR normal. °-Dehyrated on exam. Given IVF, zofran. ° °Medication Changes at Hospital Discharge: °-Changed prednisone to 20 mg daily x 5 days ° °Medications that remain the same after Hospital  Discharge:??  °-All other medications will remain the same.   ° ° °Objective: ° °Lab Results  °Component Value Date  ° CREATININE 1.47 (H) 04/06/2021  ° BUN 16 04/06/2021  ° GFR 50.65 (L) 12/12/2020  ° GFRNONAA 53 (L) 04/06/2021  ° GFRAA 48 (L) 08/11/2019  ° NA 138 04/06/2021  ° K 3.8 04/06/2021  ° CALCIUM 8.9 04/06/2021  ° CO2 20 (L) 04/06/2021  ° GLUCOSE 135 (H) 04/06/2021  ° ° °Lab Results  °Component Value Date/Time  ° GFR 50.65 (L) 12/12/2020 08:56 AM  ° GFR 48.07 (L) 04/15/2016 03:51 PM  °  °Last diabetic Eye exam: No results found for: HMDIABEYEEXA  °Last diabetic Foot exam: No results found for: HMDIABFOOTEX  ° °Lab Results  °Component Value Date  ° CHOL 158 05/22/2012  ° HDL 34.10 (L) 05/22/2012  ° LDLDIRECT 87.7 05/22/2012  ° TRIG 284.0 (H) 05/22/2012  ° CHOLHDL 5 05/22/2012  ° ° °Hepatic Function Latest Ref Rng & Units 04/06/2021 12/12/2020 11/21/2020  °Total Protein 6.5 - 8.1 g/dL 6.8 - 6.1(L)  °Albumin 3.5 - 5.0 g/dL 3.6 4.1 3.6  °AST 15 - 41 U/L 58(H) - 27  °ALT 0 - 44 U/L 109(H) - 45(H)  °Alk Phosphatase 38 - 126 U/L 60 - 45  °Total Bilirubin 0.3 - 1.2 mg/dL 1.6(H) - 1.3(H)  °Bilirubin, Direct 0.0 - 0.3 mg/dL - - -  ° ° °Lab Results  °Component Value Date/Time  ° TSH 3.99 02/22/2015 08:51 AM  ° TSH 5.70 (H) 08/17/2013 09:25 AM  ° FREET4 1.01 02/22/2015 08:51 AM  ° FREET4 1.13 08/17/2013 09:25   AM  ° ° °CBC Latest Ref Rng & Units 04/06/2021 12/06/2020 11/21/2020  °WBC 4.0 - 10.5 K/uL 11.9(H) 9.4 8.0  °Hemoglobin 13.0 - 17.0 g/dL 16.5 15.0 13.5  °Hematocrit 39.0 - 52.0 % 46.3 43.9 39.4  °Platelets 150 - 400 K/uL 163 212 150  ° ° °No results found for: VD25OH ° °Clinical ASCVD: No  °The ASCVD Risk score (Arnett DK, et al., 2019) failed to calculate for the following reasons: °  Cannot find a previous HDL lab °  Cannot find a previous total cholesterol lab   ° °Depression screen PHQ 2/9 05/02/2021 05/02/2021 05/01/2020  °Decreased Interest 0 0 0  °Down, Depressed, Hopeless 0 0 0  °PHQ - 2 Score 0 0 0  °   ° °Social History  ° °Tobacco Use  °Smoking Status Never  °Smokeless Tobacco Never  ° °BP Readings from Last 3 Encounters:  °05/02/21 100/70  °04/07/21 (!) 142/114  °12/12/20 122/80  ° °Pulse Readings from Last 3 Encounters:  °05/02/21 84  °04/07/21 84  °12/12/20 76  ° °Wt Readings from Last 3 Encounters:  °05/02/21 267 lb (121.1 kg)  °04/06/21 260 lb (117.9 kg)  °12/12/20 263 lb (119.3 kg)  ° °BMI Readings from Last 3 Encounters:  °05/02/21 35.47 kg/m²  °04/06/21 35.26 kg/m²  °12/12/20 36.68 kg/m²  ° ° °Assessment/Interventions: Review of patient past medical history, allergies, medications, health status, including review of consultants reports, laboratory and other test data, was performed as part of comprehensive evaluation and provision of chronic care management services.  ° °SDOH:  (Social Determinants of Health) assessments and interventions performed: Yes °SDOH Interventions   ° °Flowsheet Row Most Recent Value  °SDOH Interventions   °Financial Strain Interventions Intervention Not Indicated  ° °  ° °SDOH Screenings  ° °Alcohol Screen: Not on file  °Depression (PHQ2-9): Low Risk   ° PHQ-2 Score: 0  °Financial Resource Strain: Low Risk   ° Difficulty of Paying Living Expenses: Not very hard  °Food Insecurity: Not on file  °Housing: Not on file  °Physical Activity: Not on file  °Social Connections: Not on file  °Stress: Not on file  °Tobacco Use: Low Risk   ° Smoking Tobacco Use: Never  ° Smokeless Tobacco Use: Never  ° Passive Exposure: Not on file  °Transportation Needs: Not on file  ° ° °CCM Care Plan ° °No Known Allergies ° °Medications Reviewed Today   ° ° Reviewed by ,  N, RPH (Pharmacist) on 07/04/21 at 1129  Med List Status: <None>  ° °Medication Order Taking? Sig Documenting Provider Last Dose Status Informant  °allopurinol (ZYLOPRIM) 300 MG tablet [id number] Yes Take 300 mg by mouth daily. [provider] Taking Active   °calcium carbonate (TUMS) 500 MG chewable tablet  [id number] Yes Chew 1 tablet (200 mg of elemental calcium total) by mouth 2 (two) times daily as needed for indigestion or heartburn. Duncan, Graham S, MD Taking Active Self  °colchicine 0.6 MG tablet [id number] Yes Take 1 tablet (0.6 mg total) by mouth daily.  °Patient taking differently: Take 0.6 mg by mouth every other day.  ° Letvak, Richard I, MD Taking Active   °diclofenac Sodium (VOLTAREN) 1 % GEL [id number] Yes Apply 4 g topically 4 (four) times daily. Letvak, Richard I, MD Taking Active   °lisinopril (ZESTRIL) 5 MG tablet [id number] Yes Take 10 mg by mouth daily. [provider] Taking Active   °mycophenolate (CELLCEPT) 250 MG capsule [id number] Yes Take 250-500 mg by mouth 2 (two)   two) times daily. 2 capsules (500 mg) in the morning and one capsule (250 mg) in the evening [provider] Taking Active Self  omeprazole (PRILOSEC) 20 MG capsule [id number] Yes TAKE 1 CAPSULE BY MOUTH TWICE DAILY Venia Carbon, MD Taking Active   ondansetron (ZOFRAN ODT) 4 MG disintegrating tablet [id number] No Take 1 tablet (4 mg total) by mouth every 8 (eight) hours as needed.  Patient not taking: Reported on 07/04/2021   Lucrezia Starch, MD Not Taking Active   predniSONE (DELTASONE) 5 MG tablet [id number] Yes Take 5 mg by mouth daily with breakfast. [provider] Taking Active   tacrolimus (PROGRAF) 0.5 MG capsule [id number] Yes Take 0.5 mg by mouth 2 (two) times daily. [provider] Taking Active Self  tamsulosin (FLOMAX) 0.4 MG CAPS capsule [id number] Yes Take 1 capsule (0.4 mg total) by mouth daily. Venia Carbon, MD Taking Active             Patient Active Problem List   Diagnosis Date Noted   Mesenteric thrombosis ([place]) 12/12/2020   Gastroenteritis 09/25/2020   Achilles tendon mass 09/25/2020   Hypertensive urgency 09/22/2020   Joint pain 09/22/2020   Recurrent UTI 03/26/2019   Cystitis 12/01/2018   Pulmonary nodule 08/04/2018   Liver transplant status ([place])  10/10/2017   Advance directive discussed with patient 10/10/2017   Stage 3b chronic kidney disease ([place]) 10/09/2016   Mallory-Weiss tear 04/16/2016   Long-term use of immunosuppressant medication 04/08/2016   Sleep apnea 10/06/2015   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[place]) 04/10/2015   Immunosuppression ([place]) 06/01/2012   Hypertension    Routine general medical examination at a health care facility 04/26/2011   Chronic liver failure ([place]) 09/24/2010   Chronic tophaceous gout 12/21/2008   BPH without urinary obstruction 12/21/2008   ALLERGIC RHINITIS 05/22/2007   GERD 05/22/2007   HIATAL HERNIA 05/22/2007   IRRITABLE BOWEL SYNDROME, HX OF 05/22/2007   RENAL CALCULUS, HX OF 05/22/2007    Immunization History  Administered Date(s) Administered   Fluad Quad(high Dose 65+) 03/26/2019, 05/01/2020, 05/02/2021   Influenza Split 04/15/2011   Influenza Whole 04/17/2010   Influenza, High Dose Seasonal PF 03/30/2018   Influenza, Seasonal, Injecte, Preservative Fre 05/22/2012   Influenza,inj,Quad PF,6+ Mos 08/09/2013, 03/30/2015, 03/22/2016, 04/07/2017   Pneumococcal Conjugate-13 12/01/2014   Pneumococcal Polysaccharide-23 06/01/2012, 10/10/2017   Tdap 05/22/2012    Conditions to be addressed/monitored:  Hypertension, GERD, Chronic Kidney Disease, BPH, and Gout, Liver transplant status  Care Plan : [place]  Updates made by Charlton Haws, [place] since 07/04/2021 12:00 AM     Problem: Hypertension, GERD, Chronic Kidney Disease, BPH, and Gout, Liver transplant status   Priority: High     Long-Range Goal: Disease mgmt   Start Date: 07/04/2021  Expected End Date: 07/04/2022  This Visit's Progress: On track  Priority: High  Note:   Current Barriers:  Unable to independently monitor therapeutic efficacy   Pharmacist Clinical Goal(s):  Patient will achieve adherence to monitoring guidelines and medication adherence to achieve therapeutic  efficacy through collaboration with PharmD and provider.    Interventions: 1:1 collaboration with Venia Carbon, MD regarding development and update of comprehensive plan of care as evidenced by provider attestation and co-signature Inter-disciplinary care team collaboration (see longitudinal plan of care) Comprehensive medication review performed; medication list updated in electronic medical record   Hypertension / CKD Stage 3a (BP goal <130/80) -Not ideally controlled - BP has been elevated  home; pt has cardiology appt tomorrow to evaluate recent ECHO - HFpEF vs ischemia; he reports he has taken furosemide twice since it was prescribed last month °-Hx of high BP in hospital, low BP at home leading to passing out; he has been prescribed amlodipine and carvedilol during last 2 hospitalizations but he never started them °-Current home BP readings: 180/101 °-Current treatment: °Lisinopril 5 mg BID - Appropriate, Query Effective, Safe, Accessible °Furosemide 40 mg daily PRN - Appropriate, Effective, Safe, Accessible °-Medications previously tried: amlodipine, carvedilol °-Denies hypotensive/hypertensive symptoms °-Educated on BP goals and benefits of medications for prevention of heart attack, stroke and kidney damage; Importance of home blood pressure monitoring °-Counseled to monitor BP at home daily, document, and provide log at future appointments °-Discussed cardiologist will likely add/change BP medication tomorrow °-Recommended to continue current medication °  °Gout (Goal: uric acid <5) °-Controlled - pt is still taking allopurinol and colchicine after starting Krystexxa °-Started Krystexxa 06/14/21 through [facility]. Skipped most recent injection due to uric acid 0.8 (should be > 5?) °-Current treatment  °Allopurinol 300 mg daily - Query Appropriate, Effective, Safe, Accessible °Colchicine 0.6 mg QOD - Appropriate, Effective, Safe, Accessible °Krystexxa infusions q 2 weeks -  Appropriate, Effective, Safe, Accessible °-Discussed rheumatology notes -previous plan was to stop allopurinol when starting Krystexxa; pt will follow up with rheumatology for further instructions °-Recommended to continue current medication °  °Liver transplant status (Goal: prevent rejections) °-Controlled - pt has had multiple hospitalizations recently with mildly elevated AST/ALT and bili, extensive workups and transplant follow up have determined he is now stable; he endorses compliance with below regimen °-transplant 2009 for NAFLD, overall stable; follows with [facility] °-Current treatment  °Tacrolimus 0.5 mg BID - Appropriate, Effective, Safe, Accessible °Mycophenolate 250 mg - 2 cap AM and 1 cap PM - Appropriate, Effective, Safe, Accessible °Prednisone 5 mg - Appropriate, Effective, Safe, Accessible °-Discussed importance of compliance to prevent rejections °-Recommended to continue current medication °  °BPH (Goal: manage symptoms) °-Controlled - per pt report °-Current treatment  °Tamsulosin 0.4 mg daily - Appropriate, Effective, Safe, Accessible °-Recommended to continue current medication °  °GERD (Goal: manage symptoms) °-Controlled - per pt report °-Current treatment  °Omeprazole 20 mg BID - Appropriate, Effective, Safe, Accessible °Tums PRN - Appropriate, Effective, Safe, Accessible °-Recommended to continue current medication °  °Mesenteric vein thrombosis (Treatment completed) °-Controlled °-Likely provoked after appendectomy 11/2020. Initial plans was for 3 months of Eliquis, pt missed appt in Sept 2022 and was admitted 04/2021; Eliquis stopped Dec 2022 °-Current treatment  °Eliquis 5 mg BID (11/29/20 - 06/04/21) - removed from med list °-Discussed s/sx of clots; continue to monitor °  °Health Maintenance °-Vaccine gaps: none °  °Patient Goals/Self-Care Activities °Patient will:  °- take medications as prescribed as evidenced by patient report and record review °focus on medication adherence  by pill box °check blood pressure daily, document, and provide at future appointments °  °  ° °Medication Assistance: None required.  Patient affirms current coverage meets needs. ° °Compliance/Adherence/Medication fill history: °Care Gaps: °None ° °Star-Rating Drugs: °Lisinopril - LF 06/08/21 x 30 ds; PDC 85% ° °Patient's preferred pharmacy is: ° °[id number] - [place], [place] - [address] °[address] °[place] [address] °Phone: [phone] Fax: [phone] ° °Uses pill box? Yes - 7-day AM/PM box °Pt endorses 100% compliance ° °We discussed: Current pharmacy is preferred with insurance plan and patient   patient is satisfied with pharmacy services Patient decided to: Continue current medication management strategy  Care Plan and Follow Up Patient Decision:  Patient agrees to Care Plan and Follow-up.  Plan: Telephone follow up appointment with care management team member scheduled for:  6 months  Charlene Brooke, PharmD, BCACP Clinical Pharmacist [facility] [phone]

## 2021-07-04 NOTE — Patient Instructions (Signed)
Visit Information  Phone number for Pharmacist: [phone]  Thank you for meeting with me to discuss your medications! I look forward to working with you to achieve your health care goals. Below is a summary of what we talked about during the visit:   Goals Addressed             This Visit's Progress    Manage My Medicine       Timeframe:  Long-Range Goal Priority:  High Start Date:  07/04/21                           Expected End Date:    07/04/22                   Follow Up Date July 2024   - call for medicine refill 2 or 3 days before it runs out - call if I am sick and can't take my medicine - keep a list of all the medicines I take; vitamins and herbals too - use a pillbox to sort medicine    Why is this important?   These steps will help you keep on track with your medicines.   Notes:         Care Plan : [place]  Updates made by Charlton Haws, RPH since 07/04/2021 12:00 AM     Problem: Hypertension, GERD, Chronic Kidney Disease, BPH, and Gout, Liver transplant status   Priority: High     Long-Range Goal: Disease mgmt   Start Date: 07/04/2021  Expected End Date: 07/04/2022  This Visit's Progress: On track  Priority: High  Note:   Current Barriers:  Unable to independently monitor therapeutic efficacy   Pharmacist Clinical Goal(s):  Patient will achieve adherence to monitoring guidelines and medication adherence to achieve therapeutic efficacy through collaboration with PharmD and provider.    Interventions: 1:1 collaboration with Venia Carbon, MD regarding development and update of comprehensive plan of care as evidenced by provider attestation and co-signature Inter-disciplinary care team collaboration (see longitudinal plan of care) Comprehensive medication review performed; medication list updated in electronic medical record   Hypertension / CKD Stage 3a (BP goal <130/80) -Not ideally controlled - BP has been elevated at  home; pt has cardiology appt tomorrow to evaluate recent ECHO - HFpEF vs ischemia; he reports he has taken furosemide twice since it was prescribed last month -Hx of high BP in hospital, low BP at home leading to passing out; he has been prescribed amlodipine and carvedilol during last 2 hospitalizations but he never started them -Current home BP readings: 180/101 -Current treatment: Lisinopril 5 mg BID - Appropriate, Query Effective, Safe, Accessible Furosemide 40 mg daily PRN - Appropriate, Effective, Safe, Accessible -Medications previously tried: amlodipine, carvedilol -Denies hypotensive/hypertensive symptoms -Educated on BP goals and benefits of medications for prevention of heart attack, stroke and kidney damage; Importance of home blood pressure monitoring -Counseled to monitor BP at home daily, document, and provide log at future appointments -Discussed cardiologist will likely add/change BP medication tomorrow -Recommended to continue current medication   Gout (Goal: uric acid <5) -Controlled - pt is still taking allopurinol and colchicine after starting Krystexxa -Started Krystexxa 06/14/21 through [facility]. Skipped most recent injection due to uric acid 0.8 (should be > 5?) -Current treatment  Allopurinol 300 mg daily - Query Appropriate, Effective, Safe, Accessible Colchicine 0.6 mg QOD - Appropriate, Effective, Safe, Accessible Krystexxa infusions q 2 weeks -  Appropriate, Effective, Safe, Accessible -Discussed rheumatology notes -previous plan was to stop allopurinol when starting Krystexxa; pt will follow up with rheumatology for further instructions -Recommended to continue current medication   Liver transplant status (Goal: prevent rejections) -Controlled - pt has had multiple hospitalizations recently with mildly elevated AST/ALT and bili, extensive workups and transplant follow up have determined he is now stable; he endorses compliance with below  regimen -transplant 2009 for NAFLD, overall stable; follows with [facility] -Current treatment  Tacrolimus 0.5 mg BID - Appropriate, Effective, Safe, Accessible Mycophenolate 250 mg - 2 cap AM and 1 cap PM - Appropriate, Effective, Safe, Accessible Prednisone 5 mg - Appropriate, Effective, Safe, Accessible -Discussed importance of compliance to prevent rejections -Recommended to continue current medication   BPH (Goal: manage symptoms) -Controlled - per pt report -Current treatment  Tamsulosin 0.4 mg daily - Appropriate, Effective, Safe, Accessible -Recommended to continue current medication   GERD (Goal: manage symptoms) -Controlled - per pt report -Current treatment  Omeprazole 20 mg BID - Appropriate, Effective, Safe, Accessible Tums PRN - Appropriate, Effective, Safe, Accessible -Recommended to continue current medication   Mesenteric vein thrombosis (Treatment completed) -Controlled -Likely provoked after appendectomy 11/2020. Initial plans was for 3 months of Eliquis, pt missed appt in Sept 2022 and was admitted 04/2021; Eliquis stopped Dec 2022 -Current treatment  Eliquis 5 mg BID (11/29/20 - 06/04/21) - removed from med list -Discussed s/sx of clots; continue to monitor   Health Maintenance -Vaccine gaps: none   Patient Goals/Self-Care Activities Patient will:  - take medications as prescribed as evidenced by patient report and record review focus on medication adherence by pill box check blood pressure daily, document, and provide at future appointments      Devin White was given information about Chronic Care Management services today including:  CCM service includes personalized support from designated clinical staff supervised by his physician, including individualized plan of care and coordination with other care providers 24/7 contact phone numbers for assistance for urgent and routine care needs. Standard insurance, coinsurance, copays and deductibles  apply for chronic care management only during months in which we provide at least 20 minutes of these services. Most insurances cover these services at 100%, however patients may be responsible for any copay, coinsurance and/or deductible if applicable. This service may help you avoid the need for more expensive face-to-face services. Only one practitioner may furnish and bill the service in a calendar month. The patient may stop CCM services at any time (effective at the end of the month) by phone call to the office staff.  Patient agreed to services and verbal consent obtained.   Patient verbalizes understanding of instructions and care plan provided today and agrees to view in [place]. Active MyChart status confirmed with patient.   Telephone follow up appointment with pharmacy team member scheduled for: 6 months  Charlene Brooke, PharmD, [facility] Clinical Pharmacist [facility] [phone]

## 2021-07-05 DIAGNOSIS — R0609 Other forms of dyspnea: Secondary | ICD-10-CM | POA: Diagnosis not present

## 2021-07-05 DIAGNOSIS — I10 Essential (primary) hypertension: Secondary | ICD-10-CM | POA: Diagnosis not present

## 2021-07-12 DIAGNOSIS — M1A9XX1 Chronic gout, unspecified, with tophus (tophi): Secondary | ICD-10-CM | POA: Diagnosis not present

## 2021-07-17 DIAGNOSIS — N400 Enlarged prostate without lower urinary tract symptoms: Secondary | ICD-10-CM

## 2021-07-17 DIAGNOSIS — I10 Essential (primary) hypertension: Secondary | ICD-10-CM

## 2021-07-17 DIAGNOSIS — N1831 Chronic kidney disease, stage 3a: Secondary | ICD-10-CM | POA: Diagnosis not present

## 2021-07-18 DIAGNOSIS — I130 Hypertensive heart and chronic kidney disease with heart failure and stage 1 through stage 4 chronic kidney disease, or unspecified chronic kidney disease: Secondary | ICD-10-CM | POA: Diagnosis not present

## 2021-07-18 DIAGNOSIS — N189 Chronic kidney disease, unspecified: Secondary | ICD-10-CM | POA: Diagnosis not present

## 2021-07-18 DIAGNOSIS — R0609 Other forms of dyspnea: Secondary | ICD-10-CM | POA: Diagnosis not present

## 2021-07-18 DIAGNOSIS — Z944 Liver transplant status: Secondary | ICD-10-CM | POA: Diagnosis not present

## 2021-07-26 DIAGNOSIS — M1A9XX1 Chronic gout, unspecified, with tophus (tophi): Secondary | ICD-10-CM | POA: Diagnosis not present

## 2021-08-02 DIAGNOSIS — I5030 Unspecified diastolic (congestive) heart failure: Secondary | ICD-10-CM | POA: Diagnosis not present

## 2021-08-02 DIAGNOSIS — I10 Essential (primary) hypertension: Secondary | ICD-10-CM | POA: Diagnosis not present

## 2021-08-09 DIAGNOSIS — M25562 Pain in left knee: Secondary | ICD-10-CM | POA: Diagnosis not present

## 2021-08-09 DIAGNOSIS — K754 Autoimmune hepatitis: Secondary | ICD-10-CM | POA: Diagnosis not present

## 2021-08-09 DIAGNOSIS — M71121 Other infective bursitis, right elbow: Secondary | ICD-10-CM | POA: Diagnosis not present

## 2021-08-09 DIAGNOSIS — M25572 Pain in left ankle and joints of left foot: Secondary | ICD-10-CM | POA: Diagnosis not present

## 2021-08-09 DIAGNOSIS — D696 Thrombocytopenia, unspecified: Secondary | ICD-10-CM | POA: Diagnosis not present

## 2021-08-09 DIAGNOSIS — M1A3721 Chronic gout due to renal impairment, left ankle and foot, with tophus (tophi): Secondary | ICD-10-CM | POA: Diagnosis not present

## 2021-08-09 DIAGNOSIS — I130 Hypertensive heart and chronic kidney disease with heart failure and stage 1 through stage 4 chronic kidney disease, or unspecified chronic kidney disease: Secondary | ICD-10-CM | POA: Diagnosis not present

## 2021-08-09 DIAGNOSIS — M25762 Osteophyte, left knee: Secondary | ICD-10-CM | POA: Diagnosis not present

## 2021-08-09 DIAGNOSIS — K219 Gastro-esophageal reflux disease without esophagitis: Secondary | ICD-10-CM | POA: Diagnosis not present

## 2021-08-09 DIAGNOSIS — M65872 Other synovitis and tenosynovitis, left ankle and foot: Secondary | ICD-10-CM | POA: Diagnosis not present

## 2021-08-09 DIAGNOSIS — T8643 Liver transplant infection: Secondary | ICD-10-CM | POA: Diagnosis not present

## 2021-08-09 DIAGNOSIS — D849 Immunodeficiency, unspecified: Secondary | ICD-10-CM | POA: Diagnosis not present

## 2021-08-09 DIAGNOSIS — N179 Acute kidney failure, unspecified: Secondary | ICD-10-CM | POA: Diagnosis not present

## 2021-08-09 DIAGNOSIS — M25761 Osteophyte, right knee: Secondary | ICD-10-CM | POA: Diagnosis not present

## 2021-08-09 DIAGNOSIS — Z743 Need for continuous supervision: Secondary | ICD-10-CM | POA: Diagnosis not present

## 2021-08-09 DIAGNOSIS — M71122 Other infective bursitis, left elbow: Secondary | ICD-10-CM | POA: Diagnosis not present

## 2021-08-09 DIAGNOSIS — K7460 Unspecified cirrhosis of liver: Secondary | ICD-10-CM | POA: Diagnosis not present

## 2021-08-09 DIAGNOSIS — R6889 Other general symptoms and signs: Secondary | ICD-10-CM | POA: Diagnosis not present

## 2021-08-09 DIAGNOSIS — R6520 Severe sepsis without septic shock: Secondary | ICD-10-CM | POA: Diagnosis not present

## 2021-08-09 DIAGNOSIS — M1009 Idiopathic gout, multiple sites: Secondary | ICD-10-CM | POA: Diagnosis not present

## 2021-08-09 DIAGNOSIS — R609 Edema, unspecified: Secondary | ICD-10-CM | POA: Diagnosis not present

## 2021-08-09 DIAGNOSIS — M71172 Other infective bursitis, left ankle and foot: Secondary | ICD-10-CM | POA: Diagnosis not present

## 2021-08-09 DIAGNOSIS — R0902 Hypoxemia: Secondary | ICD-10-CM | POA: Diagnosis not present

## 2021-08-09 DIAGNOSIS — I5032 Chronic diastolic (congestive) heart failure: Secondary | ICD-10-CM | POA: Diagnosis not present

## 2021-08-09 DIAGNOSIS — M1A00X0 Idiopathic chronic gout, unspecified site, without tophus (tophi): Secondary | ICD-10-CM | POA: Diagnosis not present

## 2021-08-09 DIAGNOSIS — R0602 Shortness of breath: Secondary | ICD-10-CM | POA: Diagnosis not present

## 2021-08-09 DIAGNOSIS — M7021 Olecranon bursitis, right elbow: Secondary | ICD-10-CM | POA: Diagnosis not present

## 2021-08-09 DIAGNOSIS — Z944 Liver transplant status: Secondary | ICD-10-CM | POA: Diagnosis not present

## 2021-08-09 DIAGNOSIS — R509 Fever, unspecified: Secondary | ICD-10-CM | POA: Diagnosis not present

## 2021-08-09 DIAGNOSIS — N183 Chronic kidney disease, stage 3 unspecified: Secondary | ICD-10-CM | POA: Diagnosis not present

## 2021-08-09 DIAGNOSIS — M7989 Other specified soft tissue disorders: Secondary | ICD-10-CM | POA: Diagnosis not present

## 2021-08-09 DIAGNOSIS — R110 Nausea: Secondary | ICD-10-CM | POA: Diagnosis not present

## 2021-08-09 DIAGNOSIS — Z79899 Other long term (current) drug therapy: Secondary | ICD-10-CM | POA: Diagnosis not present

## 2021-08-09 DIAGNOSIS — M25561 Pain in right knee: Secondary | ICD-10-CM | POA: Diagnosis not present

## 2021-08-09 DIAGNOSIS — M7752 Other enthesopathy of left foot: Secondary | ICD-10-CM | POA: Diagnosis not present

## 2021-08-09 DIAGNOSIS — M869 Osteomyelitis, unspecified: Secondary | ICD-10-CM | POA: Diagnosis not present

## 2021-08-09 DIAGNOSIS — Z20822 Contact with and (suspected) exposure to covid-19: Secondary | ICD-10-CM | POA: Diagnosis not present

## 2021-08-09 DIAGNOSIS — I499 Cardiac arrhythmia, unspecified: Secondary | ICD-10-CM | POA: Diagnosis not present

## 2021-08-09 DIAGNOSIS — M130 Polyarthritis, unspecified: Secondary | ICD-10-CM | POA: Diagnosis not present

## 2021-08-09 DIAGNOSIS — M109 Gout, unspecified: Secondary | ICD-10-CM | POA: Diagnosis not present

## 2021-08-09 DIAGNOSIS — Z87442 Personal history of urinary calculi: Secondary | ICD-10-CM | POA: Diagnosis not present

## 2021-08-10 DIAGNOSIS — M109 Gout, unspecified: Secondary | ICD-10-CM | POA: Diagnosis not present

## 2021-08-10 DIAGNOSIS — M65872 Other synovitis and tenosynovitis, left ankle and foot: Secondary | ICD-10-CM | POA: Diagnosis not present

## 2021-08-10 DIAGNOSIS — M25572 Pain in left ankle and joints of left foot: Secondary | ICD-10-CM | POA: Diagnosis not present

## 2021-08-10 DIAGNOSIS — N183 Chronic kidney disease, stage 3 unspecified: Secondary | ICD-10-CM | POA: Diagnosis not present

## 2021-08-10 DIAGNOSIS — M71121 Other infective bursitis, right elbow: Secondary | ICD-10-CM | POA: Diagnosis not present

## 2021-08-10 DIAGNOSIS — Z944 Liver transplant status: Secondary | ICD-10-CM | POA: Diagnosis not present

## 2021-08-10 DIAGNOSIS — D849 Immunodeficiency, unspecified: Secondary | ICD-10-CM | POA: Diagnosis not present

## 2021-08-10 DIAGNOSIS — R509 Fever, unspecified: Secondary | ICD-10-CM | POA: Diagnosis not present

## 2021-08-11 DIAGNOSIS — M25562 Pain in left knee: Secondary | ICD-10-CM | POA: Diagnosis not present

## 2021-08-11 DIAGNOSIS — M25572 Pain in left ankle and joints of left foot: Secondary | ICD-10-CM | POA: Diagnosis not present

## 2021-08-11 DIAGNOSIS — M1009 Idiopathic gout, multiple sites: Secondary | ICD-10-CM | POA: Diagnosis not present

## 2021-08-11 DIAGNOSIS — M7752 Other enthesopathy of left foot: Secondary | ICD-10-CM | POA: Diagnosis not present

## 2021-08-11 DIAGNOSIS — D849 Immunodeficiency, unspecified: Secondary | ICD-10-CM | POA: Diagnosis not present

## 2021-08-11 DIAGNOSIS — M1A00X0 Idiopathic chronic gout, unspecified site, without tophus (tophi): Secondary | ICD-10-CM | POA: Diagnosis not present

## 2021-08-11 DIAGNOSIS — Z944 Liver transplant status: Secondary | ICD-10-CM | POA: Diagnosis not present

## 2021-08-11 DIAGNOSIS — R509 Fever, unspecified: Secondary | ICD-10-CM | POA: Diagnosis not present

## 2021-08-11 DIAGNOSIS — M25561 Pain in right knee: Secondary | ICD-10-CM | POA: Diagnosis not present

## 2021-08-12 DIAGNOSIS — M25562 Pain in left knee: Secondary | ICD-10-CM | POA: Diagnosis not present

## 2021-08-12 DIAGNOSIS — M25572 Pain in left ankle and joints of left foot: Secondary | ICD-10-CM | POA: Diagnosis not present

## 2021-08-12 DIAGNOSIS — M1A00X0 Idiopathic chronic gout, unspecified site, without tophus (tophi): Secondary | ICD-10-CM | POA: Diagnosis not present

## 2021-08-12 DIAGNOSIS — M25561 Pain in right knee: Secondary | ICD-10-CM | POA: Diagnosis not present

## 2021-08-12 DIAGNOSIS — R509 Fever, unspecified: Secondary | ICD-10-CM | POA: Diagnosis not present

## 2021-08-13 DIAGNOSIS — M25562 Pain in left knee: Secondary | ICD-10-CM | POA: Diagnosis not present

## 2021-08-13 DIAGNOSIS — R509 Fever, unspecified: Secondary | ICD-10-CM | POA: Diagnosis not present

## 2021-08-13 DIAGNOSIS — M1A00X0 Idiopathic chronic gout, unspecified site, without tophus (tophi): Secondary | ICD-10-CM | POA: Diagnosis not present

## 2021-08-13 DIAGNOSIS — M25561 Pain in right knee: Secondary | ICD-10-CM | POA: Diagnosis not present

## 2021-08-13 DIAGNOSIS — M25572 Pain in left ankle and joints of left foot: Secondary | ICD-10-CM | POA: Diagnosis not present

## 2021-08-14 DIAGNOSIS — R0602 Shortness of breath: Secondary | ICD-10-CM | POA: Diagnosis not present

## 2021-08-14 DIAGNOSIS — Z944 Liver transplant status: Secondary | ICD-10-CM | POA: Diagnosis not present

## 2021-08-14 DIAGNOSIS — M25572 Pain in left ankle and joints of left foot: Secondary | ICD-10-CM | POA: Diagnosis not present

## 2021-08-14 DIAGNOSIS — K754 Autoimmune hepatitis: Secondary | ICD-10-CM | POA: Diagnosis not present

## 2021-08-14 DIAGNOSIS — R509 Fever, unspecified: Secondary | ICD-10-CM | POA: Diagnosis not present

## 2021-08-14 DIAGNOSIS — M7752 Other enthesopathy of left foot: Secondary | ICD-10-CM | POA: Diagnosis not present

## 2021-08-14 DIAGNOSIS — M25561 Pain in right knee: Secondary | ICD-10-CM | POA: Diagnosis not present

## 2021-08-14 DIAGNOSIS — T8643 Liver transplant infection: Secondary | ICD-10-CM | POA: Diagnosis not present

## 2021-08-14 DIAGNOSIS — M1A00X0 Idiopathic chronic gout, unspecified site, without tophus (tophi): Secondary | ICD-10-CM | POA: Diagnosis not present

## 2021-08-14 DIAGNOSIS — D849 Immunodeficiency, unspecified: Secondary | ICD-10-CM | POA: Diagnosis not present

## 2021-08-14 DIAGNOSIS — M25562 Pain in left knee: Secondary | ICD-10-CM | POA: Diagnosis not present

## 2021-08-15 DIAGNOSIS — M25572 Pain in left ankle and joints of left foot: Secondary | ICD-10-CM | POA: Diagnosis not present

## 2021-08-15 DIAGNOSIS — R509 Fever, unspecified: Secondary | ICD-10-CM | POA: Diagnosis not present

## 2021-08-15 DIAGNOSIS — M25561 Pain in right knee: Secondary | ICD-10-CM | POA: Diagnosis not present

## 2021-08-15 DIAGNOSIS — M1A00X0 Idiopathic chronic gout, unspecified site, without tophus (tophi): Secondary | ICD-10-CM | POA: Diagnosis not present

## 2021-08-15 DIAGNOSIS — D849 Immunodeficiency, unspecified: Secondary | ICD-10-CM | POA: Diagnosis not present

## 2021-08-15 DIAGNOSIS — Z944 Liver transplant status: Secondary | ICD-10-CM | POA: Diagnosis not present

## 2021-08-15 DIAGNOSIS — M25562 Pain in left knee: Secondary | ICD-10-CM | POA: Diagnosis not present

## 2021-08-15 DIAGNOSIS — M7752 Other enthesopathy of left foot: Secondary | ICD-10-CM | POA: Diagnosis not present

## 2021-08-16 ENCOUNTER — Telehealth: Payer: Self-pay

## 2021-08-16 DIAGNOSIS — M25562 Pain in left knee: Secondary | ICD-10-CM | POA: Diagnosis not present

## 2021-08-16 DIAGNOSIS — M25572 Pain in left ankle and joints of left foot: Secondary | ICD-10-CM | POA: Diagnosis not present

## 2021-08-16 DIAGNOSIS — Z944 Liver transplant status: Secondary | ICD-10-CM | POA: Diagnosis not present

## 2021-08-16 DIAGNOSIS — R509 Fever, unspecified: Secondary | ICD-10-CM | POA: Diagnosis not present

## 2021-08-16 DIAGNOSIS — D849 Immunodeficiency, unspecified: Secondary | ICD-10-CM | POA: Diagnosis not present

## 2021-08-16 DIAGNOSIS — M7752 Other enthesopathy of left foot: Secondary | ICD-10-CM | POA: Diagnosis not present

## 2021-08-16 DIAGNOSIS — M25561 Pain in right knee: Secondary | ICD-10-CM | POA: Diagnosis not present

## 2021-08-16 DIAGNOSIS — M1A00X0 Idiopathic chronic gout, unspecified site, without tophus (tophi): Secondary | ICD-10-CM | POA: Diagnosis not present

## 2021-08-16 NOTE — Telephone Encounter (Signed)
[facility] called to make hospital follow up with bmp checked in one week. Did not see anything open with Dr. Silvio Pate. When can he be seen? Can he be seen by another provider?  ?

## 2021-08-17 NOTE — Telephone Encounter (Signed)
I will forward to Dr Silvio Pate to see if he can wait to be seen the week he gets back and possibly come next week for labs. Or does he need to see another provider next week? ?

## 2021-08-17 NOTE — Telephone Encounter (Signed)
Left message for pt. He needs a hospital f/u at 145 on 3-16. Make it a 30 min and it will take the 2pm appt time so it will not need to be blocked. ?

## 2021-08-23 DIAGNOSIS — Z944 Liver transplant status: Secondary | ICD-10-CM | POA: Diagnosis not present

## 2021-08-23 DIAGNOSIS — D849 Immunodeficiency, unspecified: Secondary | ICD-10-CM | POA: Diagnosis not present

## 2021-08-23 DIAGNOSIS — M1A9XX1 Chronic gout, unspecified, with tophus (tophi): Secondary | ICD-10-CM | POA: Diagnosis not present

## 2021-08-24 NOTE — Telephone Encounter (Signed)
Spoke to pt. He was scheduled for 09-04-21 for 15 mins. I moved him to the 23rd for 30 mins. ?

## 2021-08-31 ENCOUNTER — Telehealth: Payer: Self-pay

## 2021-08-31 NOTE — Progress Notes (Signed)
? ? ?Chronic Care Management ?Pharmacy Assistant  ? ?Name: Devin White  MRN: [id number] DOB: Nov 20, 1955 ? ?Reason for Encounter: CCM (General Adherence) ?  ?Recent office visits:  ?09/05/2021 Viviana Simpler, MD Hypertension Restart: Lisinopril 5 mg daily. Stop: Amlodipine ? ?Recent consult visits:  ?09/07/2021 Oda Kilts, RN (Infusion) Abnormal labs No provider notes ?09/06/2021 Rheumatology Chronic tophaceous gout No other info.  ?08/24/2021 - Bethel Born, RN - Telephone - Test Results Provider Notes: "First set of labs since hospital d/c. Reviewed with Dr. Loletha Grayer. Lfts normal, but sCr/BUN/K elevated. Dr. Loletha Grayer thinks this could be at least partially due to his use of lisinopril. She would like him to reach out to his pcp about switching this. Will advised increased water intake, low K diet. Repeat labs in 7-10 days." ?08/17/2021 - Jeffriann Cauthen - Transplant - Change dose: predniSONE (DELTASONE) 10 MG tablet   ?08/02/2021 - Leanor Rubenstein - Cardiology - Hypertension and Heart Failure.  No other info.  ?07/18/2021 - Leanor Rubenstein - Cardiology - Dyspnea on exertion - No other info.  ?07/06/2021 - Jeffriann Cauthen - Transplant - Transplant follow up - No other info.  ?07/05/2021 - Leanor Rubenstein - Cardiology - Dyspnea on exertion - No other info.  ? ?[place] Visits: ?09/07/2021 ?08/23/2021 ?07/26/2021 ?07/12/2021 ? ?Hospital visits:  ?Medication Reconciliation was completed by comparing discharge summary, patient?s EMR and Pharmacy list, and upon discussion with patient. ? ?Admitted to the hospital on 12/192022 due to fever and left elbow swelling. Discharge date was 06/08/2021. Discharged from [facility].   ? ?New?Medications Started at [facility] Discharge:?? ?-started (DELTASONE) 10 MG tablet ? ?Medication Changes at Hospital Discharge: ?-carvedilol (COREG) 6.25 MG tablet Take 1 tablet (6.25 mg total) by mouth every 12 (twelve) hours ?-colchicine (COLCRYS) 0.6 mg tablet  Take 1 tablet (0.6 mg total) by mouth every other day ?-predniSONE (DELTASONE) 5 MG tablet Take 2 tablets (10 mg total) by mouth every morning ? ?Medications Discontinued at Hospital Discharge: ?-Stopped allopurinoL (ZYLOPRIM) 300 MG tablet  ? ?Medications that remain the same after Hospital Discharge:??  ?-All other medications will remain the same.   ? ?Medications: ?Outpatient Encounter Medications as of 08/31/2021  ?Medication Sig  ? allopurinol (ZYLOPRIM) 300 MG tablet Take 300 mg by mouth daily.  ? calcium carbonate (TUMS) 500 MG chewable tablet Chew 1 tablet (200 mg of elemental calcium total) by mouth 2 (two) times daily as needed for indigestion or heartburn.  ? colchicine 0.6 MG tablet Take 1 tablet (0.6 mg total) by mouth daily. (Patient taking differently: Take 0.6 mg by mouth every other day.)  ? diclofenac Sodium (VOLTAREN) 1 % GEL Apply 4 g topically 4 (four) times daily.  ? lisinopril (ZESTRIL) 5 MG tablet Take 10 mg by mouth daily.  ? mycophenolate (CELLCEPT) 250 MG capsule Take 250-500 mg by mouth 2 (two) times daily. 2 capsules (500 mg) in the morning and one capsule (250 mg) in the evening  ? omeprazole (PRILOSEC) 20 MG capsule TAKE 1 CAPSULE BY MOUTH TWICE DAILY  ? ondansetron (ZOFRAN ODT) 4 MG disintegrating tablet Take 1 tablet (4 mg total) by mouth every 8 (eight) hours as needed. (Patient not taking: Reported on 07/04/2021)  ? predniSONE (DELTASONE) 5 MG tablet Take 5 mg by mouth daily with breakfast.  ? tacrolimus (PROGRAF) 0.5 MG capsule Take 0.5 mg by mouth 2 (two) times daily.  ? tamsulosin (FLOMAX) 0.4 MG CAPS capsule Take 1 capsule (0.4 mg total) by mouth daily.  ? ?  No facility-administered encounter medications on file as of 08/31/2021.  ? ?Contacted MARSHAL ESKEW on 09/11/2021 for general disease state and medication adherence call.  ? ?Patient is not more than 5 days past due for refill on the following medications per chart history: ? ?[facility] Medications: ?Medication Name/mg Last  Fill Days Supply ?Lisinopril 5 mg   06/08/2021 90   ? ?What concerns do you have about your medications? No concerns about his medications.  ? ?The patient denies side effects with their medications.  ? ?How often do you forget or accidentally miss a dose? Never ? ?Do you use a pillbox? Yes ? ?Are you having any problems getting your medications from your pharmacy? No ? ?Has the cost of your medications been a concern? No ? ?Since last visit with CPP, the following interventions have been made. Change dose: predniSONE (DELTASONE) 10 MG tablet ? ?The patient has had an ED visit since last contact.  ? ?The patient reports the following problems with their health. Patient states since he has gotten out of the hospital on March 2 he has been getting very short of breath. He can not walk very far and gets tired quickly. Patient has noticed it is getting worse. He stated it has gotten worse the past week. He is now getting night sweats; he can not walk far at all before he has to sit and rest; lightheaded; he is losing his thoughts. Patient is taking his fluid pills like he is supposed to do. Patient does not know what to do or who to see.  ? ?Patient reports the following concerns or questions for Charlene Brooke, PharmD at this time. Please see above.  ? ?Care Gaps: ?Annual wellness visit in last year? Yes 05/02/2021 ?Most Recent BP reading: 100/70 on 05/02/2021 ? ?Upcoming appointments: ?PCP appointment on 09/05/2021 ? ?Charlene Brooke, CPP notified ? ?Marijean Niemann, RMA ?Clinical Pharmacy Assistant ?[phone] ? ? ? ? ? ? ? ? ? ?

## 2021-09-04 ENCOUNTER — Ambulatory Visit: Payer: Medicare Other | Admitting: Internal Medicine

## 2021-09-05 ENCOUNTER — Ambulatory Visit (INDEPENDENT_AMBULATORY_CARE_PROVIDER_SITE_OTHER): Payer: Medicare Other | Admitting: Internal Medicine

## 2021-09-05 ENCOUNTER — Encounter: Payer: Self-pay | Admitting: Internal Medicine

## 2021-09-05 ENCOUNTER — Other Ambulatory Visit: Payer: Self-pay

## 2021-09-05 DIAGNOSIS — N1832 Chronic kidney disease, stage 3b: Secondary | ICD-10-CM | POA: Diagnosis not present

## 2021-09-05 DIAGNOSIS — I10 Essential (primary) hypertension: Secondary | ICD-10-CM | POA: Diagnosis not present

## 2021-09-05 DIAGNOSIS — I5032 Chronic diastolic (congestive) heart failure: Secondary | ICD-10-CM | POA: Diagnosis not present

## 2021-09-05 DIAGNOSIS — M1A9XX1 Chronic gout, unspecified, with tophus (tophi): Secondary | ICD-10-CM

## 2021-09-05 NOTE — Assessment & Plan Note (Signed)
Last GFR 35 ?Asked him to restart the lisinopril '5mg'$  ? ?

## 2021-09-05 NOTE — Assessment & Plan Note (Signed)
BP Readings from Last 3 Encounters:  ?09/05/21 140/88  ?05/02/21 100/70  ?04/07/21 (!) 142/114  ? ?Repeat by me 158/100 after he was relating all his problems with inpatient at [facility] ?I asked him to restart the lisinopril--but just at '5mg'$  daily ?Hold if he feels bad and BP is low ?No amlodipine ?

## 2021-09-05 NOTE — Progress Notes (Signed)
? ?Subjective:  ? ? Patient ID: Devin White, male    DOB: 1956/05/25, 66 y.o.   MRN: [id number] ? ?HPI ?Here due to problems with his blood pressure ? ?Was in hospital at [facility] last month ?Joints got really bad---feet and knees ?Think it may be gout--but not sure ?Was getting ready for surgery---then they gave him injections and it got better ? ?Did get some prednisone--and has had fluid build up ?Off allopurinol due to other IV meds ?Diagnosed with CHF with preserved EF ?Given furosemide 40--now just as needed (to prevent renal problems) ? ?Was on amlodipine '5mg'$  and lisinopril '10mg'$  for BP ?Always goes up in the hospital but not consistently up ?He stopped both of these--home BP was 90/50 and he felt really funny ?BP also similar when went in for infusion at [facility] ? ?Last GFR 35 at [facility] ?Current Outpatient Medications on File Prior to Visit  ?Medication Sig Dispense Refill  ? allopurinol (ZYLOPRIM) 300 MG tablet Take 300 mg by mouth daily.    ? calcium carbonate (TUMS) 500 MG chewable tablet Chew 1 tablet (200 mg of elemental calcium total) by mouth 2 (two) times daily as needed for indigestion or heartburn. 180 tablet 1  ? colchicine 0.6 MG tablet Take 1 tablet (0.6 mg total) by mouth daily. (Patient taking differently: Take 0.6 mg by mouth every other day.) 90 tablet 3  ? diclofenac Sodium (VOLTAREN) 1 % GEL Apply 4 g topically 4 (four) times daily. 100 g 5  ? mycophenolate (CELLCEPT) 250 MG capsule Take 250-500 mg by mouth 2 (two) times daily. 2 capsules (500 mg) in the morning and one capsule (250 mg) in the evening    ? omeprazole (PRILOSEC) 20 MG capsule TAKE 1 CAPSULE BY MOUTH TWICE DAILY 180 capsule 3  ? ondansetron (ZOFRAN ODT) 4 MG disintegrating tablet Take 1 tablet (4 mg total) by mouth every 8 (eight) hours as needed. 20 tablet 0  ? predniSONE (DELTASONE) 5 MG tablet Take 10 mg by mouth daily with breakfast.    ? tacrolimus (PROGRAF) 0.5 MG capsule Take 0.5 mg by mouth 2 (two) times daily.    ?  tamsulosin (FLOMAX) 0.4 MG CAPS capsule Take 1 capsule (0.4 mg total) by mouth daily. 90 capsule 3  ? ?No current facility-administered medications on file prior to visit.  ? ? ?No Known Allergies ? ?Past Medical History:  ?Diagnosis Date  ? Arthritis   ? back and legs  ? Benign prostatic hypertrophy   ? GERD (gastroesophageal reflux disease)   ? Gout   ? History of blood transfusion   ? pt has antibodies in his blood since previous transfusions  ? History of cirrhosis of liver S/P TRANSPLANT 2009  ? History of liver failure S/P TRANSPLANT  ? Hypertension   ? Left ureteral calculus   ? ? ?Past Surgical History:  ?Procedure Laterality Date  ? APPENDECTOMY    ? bone morrow biopsy    ? CHOLECYSTECTOMY  2007  ? CYSTO/ LEFT RETROGRADE PYELOGRAM/ LEFT URETERAL STENT PLACEMENT  03-05-2012  DR Tresa Moore [facility])  ? LEFT URETERAL CALCULI  ? CYSTOSCOPY W/ URETERAL STENT PLACEMENT  03/11/2012  ? Procedure: CYSTOSCOPY WITH STENT REPLACEMENT;  Surgeon: Alexis Frock, MD;  Location: [facility];  Service: Urology;  Laterality: Left;  ? HERNIA REPAIR  2008  ? LIVER BIOPSY    ? LIVER TRANSPLANT  11/24/2007  ? pt states doing well since liver transplant  ? LUMBAR DISC SURGERY    ?  LUMBAR FUSION    ? removal of fistula    ? URETEROSCOPY  03/11/2012  ? Procedure: URETEROSCOPY;  Surgeon: Alexis Frock, MD;  Location: [facility];  Service: Urology;  Laterality: Left;   ?STONE MANIPULATION, stone obtained ? ?[phone] ?[employer] MCR  ? ? ?Family History  ?Problem Relation Age of Onset  ? Cancer Mother   ?     colon  ? Arthritis Mother   ? Vasculitis Mother   ? Hypertension Mother   ? Cancer Father   ?     colon  ? Kidney disease Father   ? Arthritis Father   ? Arthritis Brother   ? Alcohol abuse Maternal Aunt   ? Diabetes Maternal Aunt   ? Alcohol abuse Maternal Uncle   ? Diabetes Paternal Aunt   ? Alcohol abuse Maternal Uncle   ? Alcohol abuse Maternal Uncle   ? ? ?Social History  ? ?Socioeconomic History  ? Marital  status: Single  ?  Spouse name: Not on file  ? Number of children: 3  ? Years of education: Not on file  ? Highest education level: Not on file  ?Occupational History  ? Occupation: disabled  ?  Employer: RETIRED  ?Tobacco Use  ? Smoking status: Never  ?  Passive exposure: Past  ? Smokeless tobacco: Never  ?Substance and Sexual Activity  ? Alcohol use: No  ? Drug use: No  ? Sexual activity: Not on file  ?Other Topics Concern  ? Not on file  ?Social History Narrative  ? Has living will  ? Requests daughter [name] as his health care POA  ? Would accept brief attempt at resuscitation but no prolonged ventilation  ? No tube feeds if cognitively unaware  ? ?Social Determinants of Health  ? ?Financial Resource Strain: Low Risk   ? Difficulty of Paying Living Expenses: Not very hard  ?Food Insecurity: Not on file  ?Transportation Needs: Not on file  ?Physical Activity: Not on file  ?Stress: Not on file  ?Social Connections: Not on file  ?Intimate Partner Violence: Not on file  ? ?Review of Systems ?Appetite is not that big--tries to eat ?Some cough and SOB--feels tight in chest at times ?Sleeps fitfully--HOB elevated. Some PND ? ?   ?Objective:  ? Physical Exam ?Constitutional:   ?   Appearance: Normal appearance.  ?Cardiovascular:  ?   Rate and Rhythm: Normal rate and regular rhythm.  ?   Heart sounds: No murmur heard. ?  No gallop.  ?Pulmonary:  ?   Effort: Pulmonary effort is normal.  ?   Breath sounds: Normal breath sounds. No wheezing or rales.  ?Musculoskeletal:  ?   Cervical back: Neck supple.  ?   Right lower leg: No edema.  ?   Left lower leg: No edema.  ?Lymphadenopathy:  ?   Cervical: No cervical adenopathy.  ?Neurological:  ?   Mental Status: He is alert.  ?  ? ? ? ? ?   ?Assessment & Plan:  ? ?

## 2021-09-05 NOTE — Assessment & Plan Note (Signed)
Diagnosed with diastolic dysfunction with fluid overload and echo findings ?MRI stress test showed no CAD of concern ?Has furosemide '40mg'$  for daily prn---told him to do it when his weight is up ?

## 2021-09-05 NOTE — Assessment & Plan Note (Signed)
Continues to flare ?Off allopurinol now and no colchicine ?Had investigational infusion at [facility]---???canakinumab (but usually SQ) ?

## 2021-09-06 DIAGNOSIS — M1A9XX1 Chronic gout, unspecified, with tophus (tophi): Secondary | ICD-10-CM | POA: Diagnosis not present

## 2021-09-06 DIAGNOSIS — Z7952 Long term (current) use of systemic steroids: Secondary | ICD-10-CM | POA: Diagnosis not present

## 2021-09-06 DIAGNOSIS — Z79899 Other long term (current) drug therapy: Secondary | ICD-10-CM | POA: Diagnosis not present

## 2021-09-07 DIAGNOSIS — M1A9XX1 Chronic gout, unspecified, with tophus (tophi): Secondary | ICD-10-CM | POA: Diagnosis not present

## 2021-09-14 ENCOUNTER — Emergency Department: Payer: Medicare Other

## 2021-09-14 ENCOUNTER — Other Ambulatory Visit: Payer: Self-pay

## 2021-09-14 ENCOUNTER — Emergency Department
Admission: EM | Admit: 2021-09-14 | Discharge: 2021-09-14 | Disposition: A | Discharge status: Home / Self Care | Payer: Medicare Other | Attending: Emergency Medicine | Admitting: Emergency Medicine

## 2021-09-14 ENCOUNTER — Encounter: Payer: Self-pay | Admitting: Emergency Medicine

## 2021-09-14 ENCOUNTER — Telehealth: Payer: Self-pay

## 2021-09-14 DIAGNOSIS — R079 Chest pain, unspecified: Secondary | ICD-10-CM | POA: Diagnosis not present

## 2021-09-14 DIAGNOSIS — Z20822 Contact with and (suspected) exposure to covid-19: Secondary | ICD-10-CM | POA: Diagnosis not present

## 2021-09-14 DIAGNOSIS — I611 Nontraumatic intracerebral hemorrhage in hemisphere, cortical: Secondary | ICD-10-CM | POA: Diagnosis not present

## 2021-09-14 DIAGNOSIS — R519 Headache, unspecified: Secondary | ICD-10-CM | POA: Insufficient documentation

## 2021-09-14 DIAGNOSIS — R0789 Other chest pain: Secondary | ICD-10-CM | POA: Diagnosis not present

## 2021-09-14 DIAGNOSIS — I6782 Cerebral ischemia: Secondary | ICD-10-CM | POA: Diagnosis not present

## 2021-09-14 DIAGNOSIS — R0602 Shortness of breath: Secondary | ICD-10-CM | POA: Diagnosis not present

## 2021-09-14 DIAGNOSIS — I10 Essential (primary) hypertension: Secondary | ICD-10-CM | POA: Diagnosis not present

## 2021-09-14 DIAGNOSIS — I517 Cardiomegaly: Secondary | ICD-10-CM | POA: Diagnosis not present

## 2021-09-14 DIAGNOSIS — J341 Cyst and mucocele of nose and nasal sinus: Secondary | ICD-10-CM | POA: Diagnosis not present

## 2021-09-14 LAB — TROPONIN I (HIGH SENSITIVITY)
Troponin I (High Sensitivity): 23 ng/L — ABNORMAL HIGH (ref <18)
Troponin I (High Sensitivity): 23 ng/L — ABNORMAL HIGH (ref <18)

## 2021-09-14 LAB — RESP PANEL BY RT-PCR (FLU A&B, COVID) ARPGX2
Influenza A by PCR: NEGATIVE
Influenza B by PCR: NEGATIVE
SARS Coronavirus 2 by RT PCR: NEGATIVE

## 2021-09-14 LAB — CBC
HCT: 39.7 % (ref 39.0–52.0)
Hemoglobin: 13.1 g/dL (ref 13.0–17.0)
MCH: 31.6 pg (ref 26.0–34.0)
MCHC: 33 g/dL (ref 30.0–36.0)
MCV: 95.9 fL (ref 80.0–100.0)
Platelets: 161 10*3/uL (ref 150–400)
RBC: 4.14 MIL/uL — ABNORMAL LOW (ref 4.22–5.81)
RDW: 15.1 % (ref 11.5–15.5)
WBC: 8.9 10*3/uL (ref 4.0–10.5)
nRBC: 0 % (ref 0.0–0.2)

## 2021-09-14 LAB — BASIC METABOLIC PANEL
Anion gap: 11 (ref 5–15)
BUN: 15 mg/dL (ref 8–23)
CO2: 21 mmol/L — ABNORMAL LOW (ref 22–32)
Calcium: 8.8 mg/dL — ABNORMAL LOW (ref 8.9–10.3)
Chloride: 108 mmol/L (ref 98–111)
Creatinine, Ser: 1.26 mg/dL — ABNORMAL HIGH (ref 0.61–1.24)
GFR, Estimated: >60 mL/min (ref >60)
Glucose, Bld: 161 mg/dL — ABNORMAL HIGH (ref 70–99)
Potassium: 3.6 mmol/L (ref 3.5–5.1)
Sodium: 140 mmol/L (ref 135–145)

## 2021-09-14 LAB — BRAIN NATRIURETIC PEPTIDE: B Natriuretic Peptide: 98.7 pg/mL (ref 0.0–100.0)

## 2021-09-14 MED ORDER — ALBUTEROL SULFATE (2.5 MG/3ML) 0.083% IN NEBU
2.5000 mg | INHALATION_SOLUTION | Freq: Once | RESPIRATORY_TRACT | Status: AC
Start: 2021-09-14 — End: 2021-09-14
  Administered 2021-09-14: 2.5 mg via RESPIRATORY_TRACT
  Filled 2021-09-14: qty 3

## 2021-09-14 MED ORDER — MECLIZINE HCL 25 MG PO TABS
25.0000 mg | ORAL_TABLET | Freq: Once | ORAL | Status: AC
  Administered 2021-09-14: 25 mg via ORAL
  Filled 2021-09-14: qty 1

## 2021-09-14 NOTE — Discharge Instructions (Signed)
Please seek medical attention for any high fevers, chest pain, shortness of breath, change in behavior, persistent vomiting, bloody stool or any other new or concerning symptoms.  

## 2021-09-14 NOTE — ED Triage Notes (Signed)
Pt reports has been SOB for the past several weeks getting worse daily. Pt reports was recently dx'd with CHF. Pt reports 2 days ago started with CP, tight inn nature that is constant and non radiating. Pt reports is localized to his left chest. Pt states she also gets diaphoretic at times.  ?

## 2021-09-14 NOTE — Telephone Encounter (Signed)
Pt called in to say that he still isn't feeling well since his OV with PCP on 09/05/21. Pt states that over the last 3 days his BP has not been lower than 165/100. Today it was 185/114. Pt also states that he just doesn't feel normal, continues to have SOB and sweating.  ?Pt is going to [facility] to be checked out now. Message sent to PCP as an FYI.  ?

## 2021-09-14 NOTE — ED Provider Notes (Signed)
? ?[facility] ?Provider Note ? ? ? Event Date/Time  ? First MD Initiated Contact with Patient 09/14/21 1723   ?  (approximate) ? ? ?History  ? ?Chest Pain and Shortness of Breath ? ? ?HPI ? ?Devin White is a 66 y.o. male  who, per PCP office note dated 9 days ago has history of HTN, liver transplant, who presents to the emergency department today because of concern for worsening shortness of breath as well as headache.  Patient states she has been having worsening shortness of breath over the past 3 weeks.  It is gradually got worse.  He states he notices it with exertion and when he lies flat.  He was recently diagnosed with heart failure a few months ago and says he is on a fluid pill.  He has not noticed any swelling in his legs.  This shortness of breath has been accompanied by some left sided chest pain and discomfort.  The patient states today he has also had headache.  Located on the left side.  Has been accompanied by some blurry vision.  Denies any history of migraines or bad headaches. ? ?Physical Exam  ? ?Triage Vital Signs: ?ED Triage Vitals [09/14/21 1530]  ?Enc Vitals Group  ?   BP (!) 111/99  ?   Pulse Rate 89  ?   Resp (!) 22  ?   Temp 97.9 ?F (36.6 ?C)  ?   Temp Source Oral  ?   SpO2 96 %  ?   Weight 265 lb (120.2 kg)  ?   Height '6\' 1"'$  (1.854 m)  ?   Head Circumference   ?   Peak Flow   ?   Pain Score 6  ? ?Most recent vital signs: ?Vitals:  ? 09/14/21 1530  ?BP: (!) 111/99  ?Pulse: 89  ?Resp: (!) 22  ?Temp: 97.9 ?F (36.6 ?C)  ?SpO2: 96%  ? ? ?General: Awake, no distress.  ?CV:  Good peripheral perfusion. Regular rate and rhythm ?Resp:  Normal effort. Slight crackles to bilateral bases. ?Abd:  No distention.  ?MSK:  No lower extremity edema. ?Neuro:  PERRL. EOMI. Moving all extremities. ? ? ?ED Results / Procedures / Treatments  ? ?Labs ?(all labs ordered are listed, but only abnormal results are displayed) ?Labs Reviewed  ?BASIC METABOLIC PANEL - Abnormal; Notable for  the following components:  ?    Result Value  ? CO2 21 (*)   ? Glucose, Bld 161 (*)   ? Creatinine, Ser 1.26 (*)   ? Calcium 8.8 (*)   ? All other components within normal limits  ?CBC - Abnormal; Notable for the following components:  ? RBC 4.14 (*)   ? All other components within normal limits  ?TROPONIN I (HIGH SENSITIVITY) - Abnormal; Notable for the following components:  ? Troponin I (High Sensitivity) 23 (*)   ? All other components within normal limits  ?TROPONIN I (HIGH SENSITIVITY) - Abnormal; Notable for the following components:  ? Troponin I (High Sensitivity) 23 (*)   ? All other components within normal limits  ?RESP PANEL BY RT-PCR (FLU A&B, COVID) ARPGX2  ?BRAIN NATRIURETIC PEPTIDE  ? ? ? ?EKG ? ?INance Pear, attending physician, personally viewed and interpreted this EKG ? ?EKG Time: 1532 ?Rate: 95 ?Rhythm: sinus rhythm with pac ?Axis: left axis deviation ?Intervals: qtc 454 ?QRS: LVH ?ST changes: no st elevation ?Impression: abnormal ekg ? ?RADIOLOGY ?I independently interpreted and visualized the CXR. My  interpretation: No pneumonia. No pneumothorax. ?Radiology interpretation:  ?IMPRESSION:  ?Low lung volumes, without acute disease.  ? ? ?I independently interpreted and visualized the CT head. My interpretation: No bleed. No large mass. ?Radiology interpretation:  ?IMPRESSION:  ?No acute intracranial findings are seen in noncontrast CT brain.  ?Atrophy. Small-vessel disease.  ? ?MR brain ?  ?IMPRESSION:  ?1. No acute intracranial abnormality.  ?2. Moderately advanced chronic microvascular ischemic disease,  ?stable.  ?   ? ? ?PROCEDURES: ? ?Critical Care performed: No ? ?Procedures ? ? ?MEDICATIONS ORDERED IN ED: ?Medications - No data to display ? ? ?IMPRESSION / MDM / ASSESSMENT AND PLAN / ED COURSE  ?I reviewed the triage vital signs and the nursing notes. ?             ?               ? ?Differential diagnosis includes, but is not limited to, ACS, pneumonia, PTX, tension headache,  optic neuritis.  ? ?Patient presented to the emergency department today with primary concern for chest pain and shortness of breath.  This has been going on for the past few weeks.  Patient is chest x-ray here was negative for pneumonia or pneumothorax.  Did evaluate for ACS with troponin.  While initial troponin was slightly elevated repeat without any significant change and per chart review patient has had slight elevations of his troponin in the past.  At this time I doubt this slight elevation represents any acute coronary syndrome.  Additionally patient without any hypoxia, tachycardia or tachypnea.  I have low suspicion for PE.  No widened mediastinum on chest x-ray and given characteristics of patient's pain and symptoms I do have low suspicion for dissection.  In terms of the patient's headache I did check both the CT and MRI.  Neither showed any concerning findings.  Pupils are reactive to light.  Patient states she does have an eye doctor he can follow-up with.  Did encourage patient to return for any worsening symptoms. ? ? ?FINAL CLINICAL IMPRESSION(S) / ED DIAGNOSES  ? ?Final diagnoses:  ?Atypical chest pain  ?Bad headache  ?Shortness of breath  ? ? ? ? ? ?Note:  This document was prepared using Dragon voice recognition software and may include unintentional dictation errors. ? ?  ?Nance Pear, MD ?09/14/21 2239 ? ?

## 2021-09-17 NOTE — Telephone Encounter (Signed)
Left message to see how he is doing. ?

## 2021-09-20 ENCOUNTER — Telehealth: Payer: Self-pay

## 2021-09-20 DIAGNOSIS — M1A9XX1 Chronic gout, unspecified, with tophus (tophi): Secondary | ICD-10-CM | POA: Diagnosis not present

## 2021-09-20 NOTE — Progress Notes (Signed)
? ? ?Chronic Care Management ?Pharmacy Assistant  ? ?Name: JAQUEZ FARRINGTON  MRN: [id number] DOB: [birth year] ? ?Reason for Encounter: CCM (General Adherence) ?  ?Recent office visits:  ?09/05/21 Viviana Simpler, MD Hypertension: Change: Lisinopril: 5 mg daily vs. 10 mg. No other changes. ? ?Recent consult visits:  ?09/07/21 Oda Kilts, RN ([place]): Tophaceous Gout: Treatment: Kystexxa.  ?09/06/21 Eda Paschal, MD (Rheumatology): Chronic tophaceous gout: Start: Prednisone 10 mg daily x3 months for gout flares.  ? ?Hospital visits:  ?Medication Reconciliation was completed by comparing discharge summary, patient?s EMR and Pharmacy list, and upon discussion with patient. ? ?Admitted to the hospital on 09/14/2021 due to shortness of breath and headache. Discharge date was 09/17/2021. Discharged from [facility].   ? ?Final Diagnoses: ?Atypical chest pain ?Bad headache ?Shortness of breath ? ?Medications that remain the same after Hospital Discharge:??  ?-All other medications will remain the same.   ? ?Medications: ?Outpatient Encounter Medications as of 09/20/2021  ?Medication Sig  ? allopurinol (ZYLOPRIM) 300 MG tablet Take 300 mg by mouth daily.  ? calcium carbonate (TUMS) 500 MG chewable tablet Chew 1 tablet (200 mg of elemental calcium total) by mouth 2 (two) times daily as needed for indigestion or heartburn.  ? colchicine 0.6 MG tablet Take 1 tablet (0.6 mg total) by mouth daily. (Patient taking differently: Take 0.6 mg by mouth every other day.)  ? diclofenac Sodium (VOLTAREN) 1 % GEL Apply 4 g topically 4 (four) times daily.  ? lisinopril (ZESTRIL) 5 MG tablet Take 5 mg by mouth daily.  ? mycophenolate (CELLCEPT) 250 MG capsule Take 250-500 mg by mouth 2 (two) times daily. 2 capsules (500 mg) in the morning and one capsule (250 mg) in the evening  ? omeprazole (PRILOSEC) 20 MG capsule TAKE 1 CAPSULE BY MOUTH TWICE DAILY  ? ondansetron (ZOFRAN ODT) 4 MG disintegrating tablet Take 1  tablet (4 mg total) by mouth every 8 (eight) hours as needed.  ? predniSONE (DELTASONE) 5 MG tablet Take 10 mg by mouth daily with breakfast.  ? tacrolimus (PROGRAF) 0.5 MG capsule Take 0.5 mg by mouth 2 (two) times daily.  ? tamsulosin (FLOMAX) 0.4 MG CAPS capsule Take 1 capsule (0.4 mg total) by mouth daily.  ? ?No facility-administered encounter medications on file as of 09/20/2021.  ? ?Contacted REYANSH KUSHNIR on 09/20/2021 for general disease state and medication adherence call.  ? ?Patient is more than 5 days past due for refill on the following medications per chart history: ? Lisinopril 5 mg  ? ?[facility] Medications: ?Medication Name/mg Last Fill Days Supply ?Lisinopril 5 mg   06/08/2021 30 ?Last filled at [facility] ? ?What concerns do you have about your medications? Patient is concerned that the Lisinopril 5 mg is not working. He states his blood pressure has been all over the place with some readings very high. Patient was getting his infusion during the call. I advised patient I would call back tomorrow on 09/20/2021 to discuss his readings more and get his log. Patient agreed.  ? ?The patient denies side effects with their medications.  ? ?How often do you forget or accidentally miss a dose? Never ? ?Do you use a pillbox? Yes ? ?Are you having any problems getting your medications from your pharmacy? No ? ?Has the cost of your medications been a concern? No ? ?Since last visit with CPP, the following interventions have been made.  ?Start: Prednisone 10 mg daily x3 months for gout flares.  ? ?  The patient has had an ED visit since last contact.  ? ?The patient reports the following problems with their health. Patient is concerned that the Lisinopril 5 mg is not working. He states his blood pressure has been all over the place with some readings very high.  ? ?Patient denies concerns or questions for Charlene Brooke, PharmD at this time.  ? ?Care Gaps:  ?Annual wellness visit in last year? Yes  05/02/2021 ?Most Recent BP reading: 149/74 on 09/14/2021 ? ?Upcoming appointments: ?No appointments scheduled within the next 30 days. ? ?Charlene Brooke, CPP notified ? ?Marijean Niemann, RMA ?Clinical Pharmacy Assistant ?[phone] ? ? ? ? ? ? ? ?

## 2021-09-29 DIAGNOSIS — M19072 Primary osteoarthritis, left ankle and foot: Secondary | ICD-10-CM | POA: Diagnosis not present

## 2021-09-29 DIAGNOSIS — I130 Hypertensive heart and chronic kidney disease with heart failure and stage 1 through stage 4 chronic kidney disease, or unspecified chronic kidney disease: Secondary | ICD-10-CM | POA: Diagnosis not present

## 2021-09-29 DIAGNOSIS — M1A0720 Idiopathic chronic gout, left ankle and foot, without tophus (tophi): Secondary | ICD-10-CM | POA: Diagnosis not present

## 2021-09-29 DIAGNOSIS — Z79899 Other long term (current) drug therapy: Secondary | ICD-10-CM | POA: Diagnosis not present

## 2021-09-29 DIAGNOSIS — M25572 Pain in left ankle and joints of left foot: Secondary | ICD-10-CM | POA: Diagnosis not present

## 2021-09-29 DIAGNOSIS — M19071 Primary osteoarthritis, right ankle and foot: Secondary | ICD-10-CM | POA: Diagnosis not present

## 2021-09-29 DIAGNOSIS — R0600 Dyspnea, unspecified: Secondary | ICD-10-CM | POA: Diagnosis not present

## 2021-09-29 DIAGNOSIS — R109 Unspecified abdominal pain: Secondary | ICD-10-CM | POA: Diagnosis not present

## 2021-09-29 DIAGNOSIS — Z944 Liver transplant status: Secondary | ICD-10-CM | POA: Diagnosis not present

## 2021-09-29 DIAGNOSIS — M19079 Primary osteoarthritis, unspecified ankle and foot: Secondary | ICD-10-CM | POA: Diagnosis not present

## 2021-09-29 DIAGNOSIS — M109 Gout, unspecified: Secondary | ICD-10-CM | POA: Diagnosis not present

## 2021-09-29 DIAGNOSIS — R0789 Other chest pain: Secondary | ICD-10-CM | POA: Diagnosis not present

## 2021-09-29 DIAGNOSIS — M25561 Pain in right knee: Secondary | ICD-10-CM | POA: Diagnosis not present

## 2021-09-29 DIAGNOSIS — M7989 Other specified soft tissue disorders: Secondary | ICD-10-CM | POA: Diagnosis not present

## 2021-09-29 DIAGNOSIS — K529 Noninfective gastroenteritis and colitis, unspecified: Secondary | ICD-10-CM | POA: Diagnosis not present

## 2021-09-29 DIAGNOSIS — Z20822 Contact with and (suspected) exposure to covid-19: Secondary | ICD-10-CM | POA: Diagnosis not present

## 2021-09-29 DIAGNOSIS — Z7952 Long term (current) use of systemic steroids: Secondary | ICD-10-CM | POA: Diagnosis not present

## 2021-09-29 DIAGNOSIS — I5032 Chronic diastolic (congestive) heart failure: Secondary | ICD-10-CM | POA: Diagnosis not present

## 2021-09-29 DIAGNOSIS — M25571 Pain in right ankle and joints of right foot: Secondary | ICD-10-CM | POA: Diagnosis not present

## 2021-09-29 DIAGNOSIS — M659 Synovitis and tenosynovitis, unspecified: Secondary | ICD-10-CM | POA: Diagnosis not present

## 2021-09-29 DIAGNOSIS — M1A9XX1 Chronic gout, unspecified, with tophus (tophi): Secondary | ICD-10-CM | POA: Diagnosis not present

## 2021-09-29 DIAGNOSIS — K219 Gastro-esophageal reflux disease without esophagitis: Secondary | ICD-10-CM | POA: Diagnosis not present

## 2021-09-29 DIAGNOSIS — M130 Polyarthritis, unspecified: Secondary | ICD-10-CM | POA: Diagnosis not present

## 2021-09-29 DIAGNOSIS — M89572 Osteolysis, left ankle and foot: Secondary | ICD-10-CM | POA: Diagnosis not present

## 2021-09-29 DIAGNOSIS — Z7983 Long term (current) use of bisphosphonates: Secondary | ICD-10-CM | POA: Diagnosis not present

## 2021-09-29 DIAGNOSIS — M25472 Effusion, left ankle: Secondary | ICD-10-CM | POA: Diagnosis not present

## 2021-09-29 DIAGNOSIS — D84821 Immunodeficiency due to drugs: Secondary | ICD-10-CM | POA: Diagnosis not present

## 2021-09-29 DIAGNOSIS — N183 Chronic kidney disease, stage 3 unspecified: Secondary | ICD-10-CM | POA: Diagnosis not present

## 2021-10-02 ENCOUNTER — Telehealth: Payer: Self-pay | Admitting: Internal Medicine

## 2021-10-02 NOTE — Telephone Encounter (Signed)
Pt is calling back, says a he received a call about him being in the hospital, wants to speak about that. ? ?Either number to call back: ? ?[phone] Mobile ?[place] Phone Number ? ?

## 2021-10-02 NOTE — Telephone Encounter (Signed)
Tried to call patient his phone hung up . ?

## 2021-10-12 ENCOUNTER — Ambulatory Visit (INDEPENDENT_AMBULATORY_CARE_PROVIDER_SITE_OTHER): Payer: Medicare Other | Admitting: Internal Medicine

## 2021-10-12 ENCOUNTER — Encounter: Payer: Self-pay | Admitting: Internal Medicine

## 2021-10-12 DIAGNOSIS — I10 Essential (primary) hypertension: Secondary | ICD-10-CM

## 2021-10-12 DIAGNOSIS — M1A9XX1 Chronic gout, unspecified, with tophus (tophi): Secondary | ICD-10-CM

## 2021-10-12 DIAGNOSIS — I5032 Chronic diastolic (congestive) heart failure: Secondary | ICD-10-CM | POA: Diagnosis not present

## 2021-10-12 MED ORDER — FUROSEMIDE 40 MG PO TABS: 40.0000 mg | ORAL_TABLET | Freq: Every day | ORAL | 3 refills | Status: DC

## 2021-10-12 NOTE — Assessment & Plan Note (Signed)
With another exacerbation requiring hospitalization ?Has stumped the folks at [facility] the low uric acid with infusion Rx ?Improved with anakinra x 3 days and increased prednisone ?

## 2021-10-12 NOTE — Assessment & Plan Note (Signed)
BP Readings from Last 3 Encounters:  ?10/12/21 104/70  ?09/14/21 (!) 149/74  ?09/05/21 140/88  ? ?Always gets med increased with hospital stays ?Will cut the amlodipine back to the '5mg'$ ---and stop if his BP stays low or he has orthostatic dizziness ?Continue the lisinopril '5mg'$  ?

## 2021-10-12 NOTE — Assessment & Plan Note (Signed)
Doing better since taking furosemide '40mg'$  daily ?Now on lisinopril '5mg'$  daily---will continue ?

## 2021-10-12 NOTE — Progress Notes (Signed)
? ?Subjective:  ? ? Patient ID: Devin White, male    DOB: 1955-11-14, 66 y.o.   MRN: [id number] ? ?HPI ?Here for hospital follow up ? ?Had synovitis and apparent gout attack again ?Knee swollen and feet hurting and swollen ?Taken off allopurinol--uric acid very low after Rx (started IV Rx since December) ?Required 3 days of anakinra infusions ?Had been on '40mg'$  of prednisone--weaning back down to '10mg'$  over the next couple of days ? ?Did have aspiration from ankle joint ?Did have uric acid crystals ?Some question of infection in left ankle as well--based on MRI, etc ?Didn't get antibiotics though ? ?Joints "pop and crack"----some damage noted in bones with the recurrent gout ?No tylenol in the past few days ?Got oxycodone--but hasn't been using lately ? ?BP went up as always ?Amlodipine increased to '10mg'$  daily ?Restarted on lisinopril as well ? ?Current Outpatient Medications on File Prior to Visit  ?Medication Sig Dispense Refill  ? amLODipine (NORVASC) 5 MG tablet Take 10 mg by mouth.    ? calcium carbonate (TUMS) 500 MG chewable tablet Chew 1 tablet (200 mg of elemental calcium total) by mouth 2 (two) times daily as needed for indigestion or heartburn. 180 tablet 1  ? colchicine 0.6 MG tablet Take 1 tablet (0.6 mg total) by mouth daily. 90 tablet 3  ? diclofenac Sodium (VOLTAREN) 1 % GEL Apply 4 g topically 4 (four) times daily. 100 g 5  ? furosemide (LASIX) 40 MG tablet Take 40 mg by mouth daily.    ? lisinopril (ZESTRIL) 5 MG tablet Take 5 mg by mouth daily.    ? mycophenolate (CELLCEPT) 250 MG capsule Take 250-500 mg by mouth 2 (two) times daily. 2 capsules (500 mg) in the morning and one capsule (250 mg) in the evening    ? omeprazole (PRILOSEC) 20 MG capsule TAKE 1 CAPSULE BY MOUTH TWICE DAILY 180 capsule 3  ? ondansetron (ZOFRAN ODT) 4 MG disintegrating tablet Take 1 tablet (4 mg total) by mouth every 8 (eight) hours as needed. 20 tablet 0  ? predniSONE (DELTASONE) 5 MG tablet Take 10 mg by mouth daily  with breakfast.    ? tacrolimus (PROGRAF) 0.5 MG capsule Take 0.5 mg by mouth 2 (two) times daily.    ? tamsulosin (FLOMAX) 0.4 MG CAPS capsule Take 1 capsule (0.4 mg total) by mouth daily. 90 capsule 3  ? ?No current facility-administered medications on file prior to visit.  ? ? ?No Known Allergies ? ?Past Medical History:  ?Diagnosis Date  ? Arthritis   ? back and legs  ? Benign prostatic hypertrophy   ? GERD (gastroesophageal reflux disease)   ? Gout   ? History of blood transfusion   ? pt has antibodies in his blood since previous transfusions  ? History of cirrhosis of liver S/P TRANSPLANT 2009  ? History of liver failure S/P TRANSPLANT  ? Hypertension   ? Left ureteral calculus   ? ? ?Past Surgical History:  ?Procedure Laterality Date  ? APPENDECTOMY    ? bone morrow biopsy    ? CHOLECYSTECTOMY  2007  ? CYSTO/ LEFT RETROGRADE PYELOGRAM/ LEFT URETERAL STENT PLACEMENT  03-05-2012  DR [facility])  ? LEFT URETERAL CALCULI  ? CYSTOSCOPY W/ URETERAL STENT PLACEMENT  03/11/2012  ? Procedure: CYSTOSCOPY WITH STENT REPLACEMENT;  Surgeon: Alexis Frock, MD;  Location: [facility];  Service: Urology;  Laterality: Left;  ? HERNIA REPAIR  2008  ? LIVER BIOPSY    ?  LIVER TRANSPLANT  11/24/2007  ? pt states doing well since liver transplant  ? LUMBAR DISC SURGERY    ? LUMBAR FUSION    ? removal of fistula    ? URETEROSCOPY  03/11/2012  ? Procedure: URETEROSCOPY;  Surgeon: Alexis Frock, MD;  Location: [facility];  Service: Urology;  Laterality: Left;   ?STONE MANIPULATION, stone obtained ? ?[id number] ?[employer] MCR  ? ? ?Family History  ?Problem Relation Age of Onset  ? Cancer Mother   ?     colon  ? Arthritis Mother   ? Vasculitis Mother   ? Hypertension Mother   ? Cancer Father   ?     colon  ? Kidney disease Father   ? Arthritis Father   ? Arthritis Brother   ? Alcohol abuse Maternal Aunt   ? Diabetes Maternal Aunt   ? Alcohol abuse Maternal Uncle   ? Diabetes Paternal Aunt   ? Alcohol abuse  Maternal Uncle   ? Alcohol abuse Maternal Uncle   ? ? ?Social History  ? ?Socioeconomic History  ? Marital status: Single  ?  Spouse name: Not on file  ? Number of children: 3  ? Years of education: Not on file  ? Highest education level: Not on file  ?Occupational History  ? Occupation: disabled  ?  Employer: RETIRED  ?Tobacco Use  ? Smoking status: Never  ?  Passive exposure: Past  ? Smokeless tobacco: Never  ?Substance and Sexual Activity  ? Alcohol use: No  ? Drug use: No  ? Sexual activity: Not on file  ?Other Topics Concern  ? Not on file  ?Social History Narrative  ? Has living will  ? Requests daughter [name] as his health care POA  ? Would accept brief attempt at resuscitation but no prolonged ventilation  ? No tube feeds if cognitively unaware  ? ?Social Determinants of Health  ? ?Financial Resource Strain: Low Risk   ? Difficulty of Paying Living Expenses: Not very hard  ?Food Insecurity: Not on file  ?Transportation Needs: Not on file  ?Physical Activity: Not on file  ?Stress: Not on file  ?Social Connections: Not on file  ?Intimate Partner Violence: Not on file  ? ?Review of Systems ?Tries to avoid high urate foods ?Sleeping better in the past few nights ?He feels his breathing has been better since on furosemide ?   ?Objective:  ? Physical Exam ?Constitutional:   ?   Appearance: Normal appearance.  ?Musculoskeletal:  ?   Comments: Slight swelling in right wrist and elbow--but not inflamed ?Feet/ankles are quiet  ?Neurological:  ?   Mental Status: He is alert.  ?Psychiatric:     ?   Mood and Affect: Mood normal.     ?   Behavior: Behavior normal.  ?  ? ? ? ? ?   ?Assessment & Plan:  ? ?

## 2021-10-19 DIAGNOSIS — K219 Gastro-esophageal reflux disease without esophagitis: Secondary | ICD-10-CM | POA: Diagnosis not present

## 2021-10-19 DIAGNOSIS — A084 Viral intestinal infection, unspecified: Secondary | ICD-10-CM | POA: Diagnosis not present

## 2021-10-19 DIAGNOSIS — R1084 Generalized abdominal pain: Secondary | ICD-10-CM | POA: Diagnosis not present

## 2021-10-19 DIAGNOSIS — N390 Urinary tract infection, site not specified: Secondary | ICD-10-CM | POA: Diagnosis not present

## 2021-10-19 DIAGNOSIS — E860 Dehydration: Secondary | ICD-10-CM | POA: Diagnosis not present

## 2021-10-19 DIAGNOSIS — Z7952 Long term (current) use of systemic steroids: Secondary | ICD-10-CM | POA: Diagnosis not present

## 2021-10-19 DIAGNOSIS — E876 Hypokalemia: Secondary | ICD-10-CM | POA: Diagnosis not present

## 2021-10-19 DIAGNOSIS — Z79899 Other long term (current) drug therapy: Secondary | ICD-10-CM | POA: Diagnosis not present

## 2021-10-19 DIAGNOSIS — I130 Hypertensive heart and chronic kidney disease with heart failure and stage 1 through stage 4 chronic kidney disease, or unspecified chronic kidney disease: Secondary | ICD-10-CM | POA: Diagnosis not present

## 2021-10-19 DIAGNOSIS — Z20822 Contact with and (suspected) exposure to covid-19: Secondary | ICD-10-CM | POA: Diagnosis not present

## 2021-10-19 DIAGNOSIS — Z9049 Acquired absence of other specified parts of digestive tract: Secondary | ICD-10-CM | POA: Diagnosis not present

## 2021-10-19 DIAGNOSIS — D849 Immunodeficiency, unspecified: Secondary | ICD-10-CM | POA: Diagnosis not present

## 2021-10-19 DIAGNOSIS — N179 Acute kidney failure, unspecified: Secondary | ICD-10-CM | POA: Diagnosis not present

## 2021-10-19 DIAGNOSIS — N189 Chronic kidney disease, unspecified: Secondary | ICD-10-CM | POA: Diagnosis not present

## 2021-10-19 DIAGNOSIS — I129 Hypertensive chronic kidney disease with stage 1 through stage 4 chronic kidney disease, or unspecified chronic kidney disease: Secondary | ICD-10-CM | POA: Diagnosis not present

## 2021-10-19 DIAGNOSIS — R7401 Elevation of levels of liver transaminase levels: Secondary | ICD-10-CM | POA: Diagnosis not present

## 2021-10-19 DIAGNOSIS — D72828 Other elevated white blood cell count: Secondary | ICD-10-CM | POA: Diagnosis not present

## 2021-10-19 DIAGNOSIS — R0602 Shortness of breath: Secondary | ICD-10-CM | POA: Diagnosis not present

## 2021-10-19 DIAGNOSIS — N183 Chronic kidney disease, stage 3 unspecified: Secondary | ICD-10-CM | POA: Diagnosis not present

## 2021-10-19 DIAGNOSIS — M1A9XX1 Chronic gout, unspecified, with tophus (tophi): Secondary | ICD-10-CM | POA: Diagnosis not present

## 2021-10-19 DIAGNOSIS — Z944 Liver transplant status: Secondary | ICD-10-CM | POA: Diagnosis not present

## 2021-10-19 DIAGNOSIS — R197 Diarrhea, unspecified: Secondary | ICD-10-CM | POA: Diagnosis not present

## 2021-10-19 DIAGNOSIS — K529 Noninfective gastroenteritis and colitis, unspecified: Secondary | ICD-10-CM | POA: Diagnosis not present

## 2021-10-19 DIAGNOSIS — R0789 Other chest pain: Secondary | ICD-10-CM | POA: Diagnosis not present

## 2021-10-19 DIAGNOSIS — I5032 Chronic diastolic (congestive) heart failure: Secondary | ICD-10-CM | POA: Diagnosis not present

## 2021-10-26 ENCOUNTER — Encounter (HOSPITAL_COMMUNITY): Payer: Self-pay

## 2021-10-26 ENCOUNTER — Observation Stay (HOSPITAL_COMMUNITY): Payer: Medicare Other

## 2021-10-26 ENCOUNTER — Emergency Department (HOSPITAL_COMMUNITY): Payer: Medicare Other

## 2021-10-26 ENCOUNTER — Other Ambulatory Visit: Payer: Self-pay

## 2021-10-26 ENCOUNTER — Inpatient Hospital Stay (HOSPITAL_COMMUNITY)
Admission: EM | Admit: 2021-10-26 | Discharge: 2021-11-07 | DRG: 372 | Disposition: A | Discharge status: Home Health | Payer: Medicare Other | Attending: Internal Medicine | Admitting: Internal Medicine

## 2021-10-26 DIAGNOSIS — Z6835 Body mass index (BMI) 35.0-35.9, adult: Secondary | ICD-10-CM

## 2021-10-26 DIAGNOSIS — D649 Anemia, unspecified: Secondary | ICD-10-CM | POA: Diagnosis not present

## 2021-10-26 DIAGNOSIS — N1831 Chronic kidney disease, stage 3a: Secondary | ICD-10-CM | POA: Diagnosis not present

## 2021-10-26 DIAGNOSIS — M1909 Primary osteoarthritis, other specified site: Secondary | ICD-10-CM | POA: Diagnosis not present

## 2021-10-26 DIAGNOSIS — M1A9XX1 Chronic gout, unspecified, with tophus (tophi): Secondary | ICD-10-CM | POA: Diagnosis present

## 2021-10-26 DIAGNOSIS — N281 Cyst of kidney, acquired: Secondary | ICD-10-CM | POA: Diagnosis present

## 2021-10-26 DIAGNOSIS — K7460 Unspecified cirrhosis of liver: Secondary | ICD-10-CM | POA: Diagnosis present

## 2021-10-26 DIAGNOSIS — R739 Hyperglycemia, unspecified: Secondary | ICD-10-CM | POA: Diagnosis present

## 2021-10-26 DIAGNOSIS — R200 Anesthesia of skin: Secondary | ICD-10-CM | POA: Diagnosis not present

## 2021-10-26 DIAGNOSIS — G43909 Migraine, unspecified, not intractable, without status migrainosus: Secondary | ICD-10-CM | POA: Diagnosis not present

## 2021-10-26 DIAGNOSIS — Z87440 Personal history of urinary (tract) infections: Secondary | ICD-10-CM

## 2021-10-26 DIAGNOSIS — J341 Cyst and mucocele of nose and nasal sinus: Secondary | ICD-10-CM | POA: Diagnosis not present

## 2021-10-26 DIAGNOSIS — Z944 Liver transplant status: Secondary | ICD-10-CM | POA: Diagnosis not present

## 2021-10-26 DIAGNOSIS — I5032 Chronic diastolic (congestive) heart failure: Secondary | ICD-10-CM | POA: Diagnosis not present

## 2021-10-26 DIAGNOSIS — Z981 Arthrodesis status: Secondary | ICD-10-CM

## 2021-10-26 DIAGNOSIS — R29818 Other symptoms and signs involving the nervous system: Secondary | ICD-10-CM | POA: Diagnosis not present

## 2021-10-26 DIAGNOSIS — E785 Hyperlipidemia, unspecified: Secondary | ICD-10-CM | POA: Diagnosis not present

## 2021-10-26 DIAGNOSIS — G459 Transient cerebral ischemic attack, unspecified: Secondary | ICD-10-CM | POA: Diagnosis not present

## 2021-10-26 DIAGNOSIS — N400 Enlarged prostate without lower urinary tract symptoms: Secondary | ICD-10-CM | POA: Diagnosis present

## 2021-10-26 DIAGNOSIS — N289 Disorder of kidney and ureter, unspecified: Secondary | ICD-10-CM | POA: Diagnosis not present

## 2021-10-26 DIAGNOSIS — E872 Acidosis, unspecified: Secondary | ICD-10-CM | POA: Diagnosis present

## 2021-10-26 DIAGNOSIS — E860 Dehydration: Secondary | ICD-10-CM | POA: Diagnosis present

## 2021-10-26 DIAGNOSIS — I129 Hypertensive chronic kidney disease with stage 1 through stage 4 chronic kidney disease, or unspecified chronic kidney disease: Secondary | ICD-10-CM | POA: Diagnosis not present

## 2021-10-26 DIAGNOSIS — Z841 Family history of disorders of kidney and ureter: Secondary | ICD-10-CM

## 2021-10-26 DIAGNOSIS — Z743 Need for continuous supervision: Secondary | ICD-10-CM | POA: Diagnosis not present

## 2021-10-26 DIAGNOSIS — Z79624 Long term (current) use of inhibitors of nucleotide synthesis: Secondary | ICD-10-CM

## 2021-10-26 DIAGNOSIS — N390 Urinary tract infection, site not specified: Secondary | ICD-10-CM | POA: Diagnosis not present

## 2021-10-26 DIAGNOSIS — K219 Gastro-esophageal reflux disease without esophagitis: Secondary | ICD-10-CM | POA: Diagnosis not present

## 2021-10-26 DIAGNOSIS — I10 Essential (primary) hypertension: Secondary | ICD-10-CM | POA: Diagnosis present

## 2021-10-26 DIAGNOSIS — Z833 Family history of diabetes mellitus: Secondary | ICD-10-CM

## 2021-10-26 DIAGNOSIS — R509 Fever, unspecified: Secondary | ICD-10-CM | POA: Diagnosis not present

## 2021-10-26 DIAGNOSIS — E669 Obesity, unspecified: Secondary | ICD-10-CM | POA: Diagnosis present

## 2021-10-26 DIAGNOSIS — Z20822 Contact with and (suspected) exposure to covid-19: Secondary | ICD-10-CM | POA: Diagnosis present

## 2021-10-26 DIAGNOSIS — Z8261 Family history of arthritis: Secondary | ICD-10-CM

## 2021-10-26 DIAGNOSIS — K429 Umbilical hernia without obstruction or gangrene: Secondary | ICD-10-CM | POA: Diagnosis not present

## 2021-10-26 DIAGNOSIS — M479 Spondylosis, unspecified: Secondary | ICD-10-CM | POA: Diagnosis present

## 2021-10-26 DIAGNOSIS — R112 Nausea with vomiting, unspecified: Secondary | ICD-10-CM | POA: Diagnosis present

## 2021-10-26 DIAGNOSIS — A09 Infectious gastroenteritis and colitis, unspecified: Secondary | ICD-10-CM | POA: Diagnosis not present

## 2021-10-26 DIAGNOSIS — Z7952 Long term (current) use of systemic steroids: Secondary | ICD-10-CM

## 2021-10-26 DIAGNOSIS — R300 Dysuria: Secondary | ICD-10-CM | POA: Diagnosis not present

## 2021-10-26 DIAGNOSIS — R059 Cough, unspecified: Secondary | ICD-10-CM | POA: Diagnosis not present

## 2021-10-26 DIAGNOSIS — Z79899 Other long term (current) drug therapy: Secondary | ICD-10-CM

## 2021-10-26 DIAGNOSIS — N179 Acute kidney failure, unspecified: Secondary | ICD-10-CM | POA: Diagnosis present

## 2021-10-26 DIAGNOSIS — A0472 Enterocolitis due to Clostridium difficile, not specified as recurrent: Principal | ICD-10-CM | POA: Diagnosis present

## 2021-10-26 DIAGNOSIS — R197 Diarrhea, unspecified: Secondary | ICD-10-CM | POA: Diagnosis not present

## 2021-10-26 DIAGNOSIS — R531 Weakness: Secondary | ICD-10-CM | POA: Diagnosis not present

## 2021-10-26 DIAGNOSIS — Z87442 Personal history of urinary calculi: Secondary | ICD-10-CM

## 2021-10-26 DIAGNOSIS — M10071 Idiopathic gout, right ankle and foot: Secondary | ICD-10-CM | POA: Diagnosis not present

## 2021-10-26 DIAGNOSIS — G43009 Migraine without aura, not intractable, without status migrainosus: Secondary | ICD-10-CM | POA: Diagnosis not present

## 2021-10-26 DIAGNOSIS — I700 Atherosclerosis of aorta: Secondary | ICD-10-CM | POA: Diagnosis not present

## 2021-10-26 DIAGNOSIS — Q283 Other malformations of cerebral vessels: Secondary | ICD-10-CM | POA: Diagnosis not present

## 2021-10-26 DIAGNOSIS — R109 Unspecified abdominal pain: Secondary | ICD-10-CM | POA: Diagnosis not present

## 2021-10-26 LAB — URINALYSIS, ROUTINE W REFLEX MICROSCOPIC
Bilirubin Urine: NEGATIVE
Glucose, UA: NEGATIVE mg/dL
Ketones, ur: 5 mg/dL — AB
Leukocytes,Ua: NEGATIVE
Nitrite: NEGATIVE
Protein, ur: 30 mg/dL — AB
Specific Gravity, Urine: 1.019 (ref 1.005–1.030)
pH: 5 (ref 5.0–8.0)

## 2021-10-26 LAB — LACTIC ACID, PLASMA
Lactic Acid, Venous: 1.7 mmol/L (ref 0.5–1.9)
Lactic Acid, Venous: 2.7 mmol/L (ref 0.5–1.9)

## 2021-10-26 LAB — CBC WITH DIFFERENTIAL/PLATELET
Abs Immature Granulocytes: 0.77 10*3/uL — ABNORMAL HIGH (ref 0.00–0.07)
Basophils Absolute: 0.1 10*3/uL (ref 0.0–0.1)
Basophils Relative: 1 %
Eosinophils Absolute: 0.3 10*3/uL (ref 0.0–0.5)
Eosinophils Relative: 1 %
HCT: 50.1 % (ref 39.0–52.0)
Hemoglobin: 16.8 g/dL (ref 13.0–17.0)
Immature Granulocytes: 4 %
Lymphocytes Relative: 17 %
Lymphs Abs: 3.2 10*3/uL (ref 0.7–4.0)
MCH: 33.1 pg (ref 26.0–34.0)
MCHC: 33.5 g/dL (ref 30.0–36.0)
MCV: 98.8 fL (ref 80.0–100.0)
Monocytes Absolute: 1.9 10*3/uL — ABNORMAL HIGH (ref 0.1–1.0)
Monocytes Relative: 10 %
Neutro Abs: 12.8 10*3/uL — ABNORMAL HIGH (ref 1.7–7.7)
Neutrophils Relative %: 67 %
Platelets: 189 10*3/uL (ref 150–400)
RBC: 5.07 MIL/uL (ref 4.22–5.81)
RDW: 13.8 % (ref 11.5–15.5)
WBC: 19.1 10*3/uL — ABNORMAL HIGH (ref 4.0–10.5)
nRBC: 0 % (ref 0.0–0.2)

## 2021-10-26 LAB — COMPREHENSIVE METABOLIC PANEL
ALT: 106 U/L — ABNORMAL HIGH (ref 0–44)
AST: 31 U/L (ref 15–41)
Albumin: 4.5 g/dL (ref 3.5–5.0)
Alkaline Phosphatase: 47 U/L (ref 38–126)
Anion gap: 14 (ref 5–15)
BUN: 23 mg/dL (ref 8–23)
CO2: 21 mmol/L — ABNORMAL LOW (ref 22–32)
Calcium: 10.1 mg/dL (ref 8.9–10.3)
Chloride: 104 mmol/L (ref 98–111)
Creatinine, Ser: 1.89 mg/dL — ABNORMAL HIGH (ref 0.61–1.24)
GFR, Estimated: 39 mL/min — ABNORMAL LOW (ref >60)
Glucose, Bld: 174 mg/dL — ABNORMAL HIGH (ref 70–99)
Potassium: 4.1 mmol/L (ref 3.5–5.1)
Sodium: 139 mmol/L (ref 135–145)
Total Bilirubin: 1.8 mg/dL — ABNORMAL HIGH (ref 0.3–1.2)
Total Protein: 7.4 g/dL (ref 6.5–8.1)

## 2021-10-26 LAB — BILIRUBIN, FRACTIONATED(TOT/DIR/INDIR)
Bilirubin, Direct: 0.4 mg/dL — ABNORMAL HIGH (ref 0.0–0.2)
Indirect Bilirubin: 1.4 mg/dL — ABNORMAL HIGH (ref 0.3–0.9)
Total Bilirubin: 1.8 mg/dL — ABNORMAL HIGH (ref 0.3–1.2)

## 2021-10-26 LAB — C DIFFICILE QUICK SCREEN W PCR REFLEX
C Diff antigen: POSITIVE — AB
C Diff toxin: NEGATIVE

## 2021-10-26 LAB — CLOSTRIDIUM DIFFICILE BY PCR, REFLEXED: Toxigenic C. Difficile by PCR: POSITIVE — AB

## 2021-10-26 LAB — HEMOGLOBIN A1C
Hgb A1c MFr Bld: 6.2 % — ABNORMAL HIGH (ref 4.8–5.6)
Mean Plasma Glucose: 131.24 mg/dL

## 2021-10-26 LAB — HIV ANTIBODY (ROUTINE TESTING W REFLEX): HIV Screen 4th Generation wRfx: NONREACTIVE

## 2021-10-26 MED ORDER — SODIUM CHLORIDE 0.9 % IV SOLN: INTRAVENOUS | Status: DC

## 2021-10-26 MED ORDER — FENTANYL CITRATE PF 50 MCG/ML IJ SOSY
25.0000 ug | PREFILLED_SYRINGE | Freq: Once | INTRAMUSCULAR | Status: AC
  Administered 2021-10-26: 25 ug via INTRAVENOUS
  Filled 2021-10-26: qty 1

## 2021-10-26 MED ORDER — MYCOPHENOLATE MOFETIL 250 MG PO CAPS
500.0000 mg | ORAL_CAPSULE | Freq: Every morning | ORAL | Status: DC
  Administered 2021-10-27 – 2021-11-07 (×12): 500 mg via ORAL
  Filled 2021-10-26 (×13): qty 2

## 2021-10-26 MED ORDER — TACROLIMUS 0.5 MG PO CAPS
0.5000 mg | ORAL_CAPSULE | Freq: Every morning | ORAL | Status: DC
  Administered 2021-10-27 – 2021-11-07 (×12): 0.5 mg via ORAL
  Filled 2021-10-26 (×13): qty 1

## 2021-10-26 MED ORDER — ONDANSETRON HCL 4 MG/2ML IJ SOLN
4.0000 mg | Freq: Once | INTRAMUSCULAR | Status: AC
  Administered 2021-10-26: 4 mg via INTRAVENOUS
  Filled 2021-10-26: qty 2

## 2021-10-26 MED ORDER — TACROLIMUS 0.5 MG PO CAPS: 0.5000 mg | ORAL_CAPSULE | ORAL | Status: DC

## 2021-10-26 MED ORDER — TAMSULOSIN HCL 0.4 MG PO CAPS
0.4000 mg | ORAL_CAPSULE | Freq: Every day | ORAL | Status: DC
  Administered 2021-10-26 – 2021-10-28 (×3): 0.4 mg via ORAL
  Filled 2021-10-26 (×3): qty 1

## 2021-10-26 MED ORDER — PREDNISONE 5 MG PO TABS
10.0000 mg | ORAL_TABLET | Freq: Every day | ORAL | Status: DC
  Administered 2021-10-27 – 2021-10-30 (×4): 10 mg via ORAL
  Filled 2021-10-26 (×4): qty 2

## 2021-10-26 MED ORDER — PROCHLORPERAZINE EDISYLATE 10 MG/2ML IJ SOLN: 10.0000 mg | Freq: Four times a day (QID) | INTRAMUSCULAR | Status: DC | PRN

## 2021-10-26 MED ORDER — VANCOMYCIN HCL 125 MG PO CAPS
125.0000 mg | ORAL_CAPSULE | Freq: Four times a day (QID) | ORAL | Status: AC
  Administered 2021-10-26 – 2021-11-05 (×40): 125 mg via ORAL
  Filled 2021-10-26 (×43): qty 1

## 2021-10-26 MED ORDER — MYCOPHENOLATE MOFETIL 250 MG PO CAPS: 250.0000 mg | ORAL_CAPSULE | Freq: Two times a day (BID) | ORAL | Status: DC

## 2021-10-26 MED ORDER — ENSURE ENLIVE PO LIQD
237.0000 mL | Freq: Two times a day (BID) | ORAL | Status: DC
  Administered 2021-10-27 – 2021-11-05 (×14): 237 mL via ORAL

## 2021-10-26 MED ORDER — TACROLIMUS 1 MG PO CAPS
1.0000 mg | ORAL_CAPSULE | Freq: Every day | ORAL | Status: DC
  Administered 2021-10-26 – 2021-11-06 (×12): 1 mg via ORAL
  Filled 2021-10-26 (×13): qty 1

## 2021-10-26 MED ORDER — MYCOPHENOLATE MOFETIL 250 MG PO CAPS
250.0000 mg | ORAL_CAPSULE | Freq: Every evening | ORAL | Status: DC
  Administered 2021-10-26 – 2021-11-06 (×12): 250 mg via ORAL
  Filled 2021-10-26 (×14): qty 1

## 2021-10-26 MED ORDER — LACTATED RINGERS IV BOLUS
500.0000 mL | Freq: Once | INTRAVENOUS | Status: AC
Start: 2021-10-26 — End: 2021-10-26
  Administered 2021-10-26: 500 mL via INTRAVENOUS

## 2021-10-26 MED ORDER — OXYCODONE HCL 5 MG PO TABS
2.5000 mg | ORAL_TABLET | Freq: Four times a day (QID) | ORAL | Status: DC | PRN
  Administered 2021-10-26 – 2021-10-30 (×8): 2.5 mg via ORAL
  Filled 2021-10-26 (×8): qty 1

## 2021-10-26 MED ORDER — LACTATED RINGERS IV BOLUS
500.0000 mL | Freq: Once | INTRAVENOUS | Status: AC
  Administered 2021-10-26: 500 mL via INTRAVENOUS

## 2021-10-26 NOTE — H&P (Signed)
?History and Physical  ? ? ?Patient: Devin White YSA:[id number] DOB: [birth year] ?DOA: 10/26/2021 ?DOS: the patient was seen and examined on 10/26/2021 ?PCP: Venia Carbon, MD  ?Patient coming from: Home ? ?Chief Complaint:  ?Chief Complaint  ?Patient presents with  ? Weakness  ? ?HPI: Devin White is a 66 y.o. male with medical history significant of liver transplant, HTN, BPH, GERD. Presenting with N/V/D. He was recently discharged from [facility] for recurrent UTI. He was finishing a course of abx and felt nauseous each time he took the medication (ceftin). He began having diarrhea as well. He reports over 30 episodes of diarrhea since initiating the oral antibiotic. He has also had episodes of vomiting. This morning he felt extremely week and had a fever of 101. So he decided to come to the ED for evaluation. He denies any other aggravating or alleviating factors.  ? ?Review of Systems: As mentioned in the history of present illness. All other systems reviewed and are negative. ?Past Medical History:  ?Diagnosis Date  ? Arthritis   ? back and legs  ? Benign prostatic hypertrophy   ? GERD (gastroesophageal reflux disease)   ? Gout   ? History of blood transfusion   ? pt has antibodies in his blood since previous transfusions  ? History of cirrhosis of liver S/P TRANSPLANT 2009  ? History of liver failure S/P TRANSPLANT  ? Hypertension   ? Left ureteral calculus   ? ?Past Surgical History:  ?Procedure Laterality Date  ? APPENDECTOMY    ? bone morrow biopsy    ? CHOLECYSTECTOMY  2007  ? CYSTO/ LEFT RETROGRADE PYELOGRAM/ LEFT URETERAL STENT PLACEMENT  03-05-2012  DR Tresa Moore [facility])  ? LEFT URETERAL CALCULI  ? CYSTOSCOPY W/ URETERAL STENT PLACEMENT  03/11/2012  ? Procedure: CYSTOSCOPY WITH STENT REPLACEMENT;  Surgeon: Alexis Frock, MD;  Location: [facility];  Service: Urology;  Laterality: Left;  ? HERNIA REPAIR  2008  ? LIVER BIOPSY    ? LIVER TRANSPLANT  11/24/2007  ? pt states doing well since  liver transplant  ? LUMBAR DISC SURGERY    ? LUMBAR FUSION    ? removal of fistula    ? URETEROSCOPY  03/11/2012  ? Procedure: URETEROSCOPY;  Surgeon: Alexis Frock, MD;  Location: [facility];  Service: Urology;  Laterality: Left;   ?STONE MANIPULATION, stone obtained ? ?[phone] ?[employer] MCR  ? ?Social History:  reports that he has never smoked. He has been exposed to tobacco smoke. He has never used smokeless tobacco. He reports that he does not drink alcohol and does not use drugs. ? ?No Known Allergies ? ?Family History  ?Problem Relation Age of Onset  ? Cancer Mother   ?     colon  ? Arthritis Mother   ? Vasculitis Mother   ? Hypertension Mother   ? Cancer Father   ?     colon  ? Kidney disease Father   ? Arthritis Father   ? Arthritis Brother   ? Alcohol abuse Maternal Aunt   ? Diabetes Maternal Aunt   ? Alcohol abuse Maternal Uncle   ? Diabetes Paternal Aunt   ? Alcohol abuse Maternal Uncle   ? Alcohol abuse Maternal Uncle   ? ? ?Prior to Admission medications   ?Medication Sig Start Date End Date Taking? Authorizing Provider  ?amLODipine (NORVASC) 5 MG tablet Take 5 mg by mouth. 10/06/21   [provider]  ?calcium carbonate (TUMS) 500  MG chewable tablet Chew 1 tablet (200 mg of elemental calcium total) by mouth 2 (two) times daily as needed for indigestion or heartburn. 06/26/16   Tonia Ghent, MD  ?colchicine 0.6 MG tablet Take 1 tablet (0.6 mg total) by mouth daily. 05/02/21   Venia Carbon, MD  ?diclofenac Sodium (VOLTAREN) 1 % GEL Apply 4 g topically 4 (four) times daily. 05/25/21 05/25/22  Venia Carbon, MD  ?furosemide (LASIX) 40 MG tablet Take 1 tablet (40 mg total) by mouth daily. 10/12/21   Venia Carbon, MD  ?lisinopril (ZESTRIL) 5 MG tablet Take 5 mg by mouth daily.    [provider]  ?mycophenolate (CELLCEPT) 250 MG capsule Take 250-500 mg by mouth 2 (two) times daily. 2 capsules (500 mg) in the morning and one capsule (250 mg) in the evening 02/04/19    [provider]  ?omeprazole (PRILOSEC) 20 MG capsule TAKE 1 CAPSULE BY MOUTH TWICE DAILY 02/26/21   Venia Carbon, MD  ?ondansetron (ZOFRAN ODT) 4 MG disintegrating tablet Take 1 tablet (4 mg total) by mouth every 8 (eight) hours as needed. 04/07/21   Lucrezia Starch, MD  ?predniSONE (DELTASONE) 5 MG tablet Take 10 mg by mouth daily with breakfast.    [provider]  ?tacrolimus (PROGRAF) 0.5 MG capsule Take 0.5 mg by mouth 2 (two) times daily.    [provider]  ?tamsulosin (FLOMAX) 0.4 MG CAPS capsule Take 1 capsule (0.4 mg total) by mouth daily. 12/12/20   Venia Carbon, MD  ? ? ?Physical Exam: ?Vitals:  ? 10/26/21 0930 10/26/21 0934 10/26/21 1000 10/26/21 1101  ?BP: (!) 141/118  123/74 115/82  ?Pulse: 95 92 89 97  ?Resp: '16 15 15 16  '$ ?Temp:      ?TempSrc:      ?SpO2: 97% 97% 95% 96%  ?Weight:      ?Height:      ? ?General: 66 y.o. male resting in bed in NAD ?Eyes: PERRL, normal sclera ?ENMT: Nares patent w/o discharge, orophaynx clear, dentition normal, ears w/o discharge/lesions/ulcers ?Neck: Supple, trachea midline ?Cardiovascular: RRR, +S1, S2, no m/g/r, equal pulses throughout ?Respiratory: CTABL, no w/r/r, normal WOB ?GI: BS+, NDNT, no masses noted, no organomegaly noted ?MSK: No e/c/c ?Neuro: A&O x 3, no focal deficits ?Psyc: Appropriate interaction and affect, calm/cooperative ? ?Data Reviewed: ? ?Na+  139 ?CO2  21 ?Glucose  174 ?Scr  1.89 ?T bili  1.8 ?WBC  19.1 ?Lactic acid 1.7 ? ?CT ab/pelvis ?1. Fluid-filled colon, consistent with the clinical history of diarrhea. Mildly dilated small bowel loops may represent concurrent ileus. No transition point to suggest bowel obstruction. ?2. Bilateral nephrolithiasis without obstructive uropathy. ?3. Mild subpleural reticulation at the lung bases could represent interstitial lung disease such as nonspecific interstitial pneumonitis. Correlate with pulmonary symptoms. ?4. Aortic Atherosclerosis (ICD10-I70.0). Coronary artery  atherosclerosis. ?5. Esophageal air fluid level suggests dysmotility or gastroesophageal reflux. ? ?CXR: Mild cardiomegaly without acute abnormality of the lungs in AP ?portable projection ? ?Assessment and Plan: ?N/V/D ?    - place in obs, med-surg ?    - C diff PCR is pending, GI PCR pending ?    - fluids ?    - compazine ?    - follow  ? ?AKI on CKD 3a ?    - baseline Scr is 1.2 - 1.4; he's at 1.89 today ?    - fluids, renal US, watch nephrotoxins ? ?HTN ?    - his pressures  are ok right now ?    - holding home lasix, lisinopril for now d/t AKI ?    - hold norvasc and reassess vitals in AM ? ?Hyperbilirubinemia ?    - check fractionated bili ? ?Hyperglycemia ?    - no history of DM; he's on steroids ?    - check A1c ? ?Hx of liver transplant ?    - continue home regimen ? ?BPH ?    - continue home regimen ? ?Gout ?    - hold home colchicine ? ?GERD ?    - hold PPI ? ?Advance Care Planning:   Code Status: FULL ? ?Consults: None ? ?Family Communication: None at bedside ? ?Severity of Illness: ?The appropriate patient status for this patient is INPATIENT. Inpatient status is judged to be reasonable and necessary in order to provide the required intensity of service to ensure the patient's safety. The patient's presenting symptoms, physical exam findings, and initial radiographic and laboratory data in the context of their chronic comorbidities is felt to place them at high risk for further clinical deterioration. Furthermore, it is not anticipated that the patient will be medically stable for discharge from the hospital within 2 midnights of admission.  ? ?* I certify that at the point of admission it is my clinical judgment that the patient will require inpatient hospital care spanning beyond 2 midnights from the point of admission due to high intensity of service, high risk for further deterioration and high frequency of surveillance required.* ? ?Author: ?Jonnie Finner, DO ?10/26/2021 11:51 AM ? ?For on call  review [email or web address].  ?

## 2021-10-26 NOTE — Progress Notes (Signed)
Patient stated that he has been unable to urinate since 0400 and was having some lower abdominal pressure. Bladder scan resulted in 0 mL, order obtained for in and out cath. Less than 50 mL of urine noted to drainage bag. Procedure tolerated well. Patient has IV fluids infusing as well as drinking liquids. Will note any changes. ?

## 2021-10-26 NOTE — ED Provider Notes (Signed)
?[facility] DEPT ?Provider Note ? ? ?CSN: [id number] ?Arrival date & time: 10/26/21  9373 ? ?  ? ?History ? ?Chief Complaint  ?Patient presents with  ? Weakness  ? ? ?Devin White is a 66 y.o. male. ? ?HPI ?66 yo male ho liver tx, states he went to [place] last week for standard evaluation and was found to have uti, hospitalized for a week.  Discharged 2 days ago.  Yesterday began having loose stools up to 30, about 0300 patient got hot and started sweating then vomited x j3.  No blood or bile.  He did notes some blood in stool this am. ? ?PMD letvack ?[facility] liver tx- 2009-prograf, cellcept and prednisone. ? ?  ? ?Home Medications ?Prior to Admission medications   ?Medication Sig Start Date End Date Taking? Authorizing Provider  ?amLODipine (NORVASC) 5 MG tablet Take 5 mg by mouth. 10/06/21   [provider]  ?calcium carbonate (TUMS) 500 MG chewable tablet Chew 1 tablet (200 mg of elemental calcium total) by mouth 2 (two) times daily as needed for indigestion or heartburn. 06/26/16   Tonia Ghent, MD  ?colchicine 0.6 MG tablet Take 1 tablet (0.6 mg total) by mouth daily. 05/02/21   Venia Carbon, MD  ?diclofenac Sodium (VOLTAREN) 1 % GEL Apply 4 g topically 4 (four) times daily. 05/25/21 05/25/22  Venia Carbon, MD  ?furosemide (LASIX) 40 MG tablet Take 1 tablet (40 mg total) by mouth daily. 10/12/21   Venia Carbon, MD  ?lisinopril (ZESTRIL) 5 MG tablet Take 5 mg by mouth daily.    [provider]  ?mycophenolate (CELLCEPT) 250 MG capsule Take 250-500 mg by mouth 2 (two) times daily. 2 capsules (500 mg) in the morning and one capsule (250 mg) in the evening 02/04/19   [provider]  ?omeprazole (PRILOSEC) 20 MG capsule TAKE 1 CAPSULE BY MOUTH TWICE DAILY 02/26/21   Venia Carbon, MD  ?ondansetron (ZOFRAN ODT) 4 MG disintegrating tablet Take 1 tablet (4 mg total) by mouth every 8 (eight) hours as needed. 04/07/21   Lucrezia Starch, MD   ?predniSONE (DELTASONE) 5 MG tablet Take 10 mg by mouth daily with breakfast.    [provider]  ?tacrolimus (PROGRAF) 0.5 MG capsule Take 0.5 mg by mouth 2 (two) times daily.    [provider]  ?tamsulosin (FLOMAX) 0.4 MG CAPS capsule Take 1 capsule (0.4 mg total) by mouth daily. 12/12/20   Venia Carbon, MD  ?   ? ?Allergies    ?Patient has no known allergies.   ? ?Review of Systems   ?Review of Systems  ?Constitutional:  Positive for activity change.  ?HENT: Negative.    ?Respiratory: Negative.    ?Cardiovascular:  Positive for chest pain.  ?     Points to upper abdomen, ho chf  ?Gastrointestinal:  Positive for abdominal pain and diarrhea.  ?Genitourinary:  Positive for difficulty urinating.  ?Musculoskeletal: Negative.   ?Skin: Negative.   ?Neurological: Negative.   ? ?Physical Exam ?Updated Vital Signs ?BP 115/82   Pulse 97   Temp 99.3 ?F (37.4 ?C) (Rectal)   Resp 16   Ht 1.803 m ('5\' 11"'$ )   Wt 116.6 kg   SpO2 96%   BMI 35.84 kg/m?  ?Physical Exam ?Vitals reviewed.  ?Constitutional:   ?   Appearance: Normal appearance.  ?HENT:  ?   Head: Normocephalic.  ?   Right Ear: External ear normal.  ?  Left Ear: External ear normal.  ?   Nose: Nose normal.  ?   Mouth/Throat:  ?   Mouth: Mucous membranes are dry.  ?Cardiovascular:  ?   Rate and Rhythm: Tachycardia present.  ?   Pulses: Normal pulses.  ?   Heart sounds: No murmur heard. ?Pulmonary:  ?   Effort: Pulmonary effort is normal.  ?   Breath sounds: Normal breath sounds. No wheezing or rhonchi.  ?   Comments: tachypneic ?Abdominal:  ?   General: Bowel sounds are normal.  ?   Palpations: Abdomen is soft.  ?   Tenderness: There is abdominal tenderness. There is left CVA tenderness.  ?Musculoskeletal:     ?   General: Normal range of motion.  ?   Cervical back: Normal range of motion.  ?Skin: ?   General: Skin is warm and dry.  ?   Capillary Refill: Capillary refill takes less than 2 seconds.  ?Neurological:  ?   General: No focal  deficit present.  ?   Mental Status: He is alert.  ?Psychiatric:     ?   Mood and Affect: Mood normal.  ? ? ?ED Results / Procedures / Treatments   ?Labs ?(all labs ordered are listed, but only abnormal results are displayed) ?Labs Reviewed  ?CBC WITH DIFFERENTIAL/PLATELET - Abnormal; Notable for the following components:  ?    Result Value  ? WBC 19.1 (*)   ? Neutro Abs 12.8 (*)   ? Monocytes Absolute 1.9 (*)   ? Abs Immature Granulocytes 0.77 (*)   ? All other components within normal limits  ?COMPREHENSIVE METABOLIC PANEL - Abnormal; Notable for the following components:  ? CO2 21 (*)   ? Glucose, Bld 174 (*)   ? Creatinine, Ser 1.89 (*)   ? ALT 106 (*)   ? Total Bilirubin 1.8 (*)   ? GFR, Estimated 39 (*)   ? All other components within normal limits  ?GASTROINTESTINAL PANEL BY PCR, STOOL (REPLACES STOOL CULTURE)  ?C DIFFICILE QUICK SCREEN W PCR REFLEX    ?CULTURE, BLOOD (ROUTINE X 2)  ?CULTURE, BLOOD (ROUTINE X 2)  ?LACTIC ACID, PLASMA  ?URINALYSIS, ROUTINE W REFLEX MICROSCOPIC  ?LACTIC ACID, PLASMA  ?POC OCCULT BLOOD, ED  ? ? ?EKG ?EKG Interpretation ? ?Date/Time:  Friday Oct 26 2021 08:35:24 EDT ?Ventricular Rate:  91 ?PR Interval:  141 ?QRS Duration: 84 ?QT Interval:  355 ?QTC Calculation: 437 ?R Axis:   -8 ?Text Interpretation: Sinus rhythm Atrial premature complexes Probable left atrial enlargement Abnormal R-wave progression, early transition LVH with secondary repolarization abnormality Baseline wander in lead(s) V1 No significant change since last tracing 14 September 2021 Confirmed by Pattricia Boss [phone]) on 10/26/2021 11:09:56 AM ? ?Radiology ?CT ABDOMEN PELVIS WO CONTRAST ? ?Result Date: 10/26/2021 ?CLINICAL DATA:  Generalized abdominal pain. Nausea and vomiting. Diarrhea. On antibiotics for urinary tract infection. Liver transplant 12 years ago. EXAM: CT ABDOMEN AND PELVIS WITHOUT CONTRAST TECHNIQUE: Multidetector CT imaging of the abdomen and pelvis was performed following the standard protocol without  IV contrast. RADIATION DOSE REDUCTION: This exam was performed according to the departmental dose-optimization program which includes automated exposure control, adjustment of the mA and/or kV according to patient size and/or use of iterative reconstruction technique. COMPARISON:  04/07/2021 abdominal ultrasound.  CT 11/21/2020 FINDINGS: Lower chest: Mild subpleural reticulation at the lung bases. Mild cardiomegaly. Lipomatous hypertrophy of the interatrial septum. Right coronary artery calcification. Fluid a in the distal esophagus on 01/02. Hepatobiliary: Status post  liver transplant. Cholecystectomy, without biliary ductal dilatation. Pancreas: Mild pancreatic atrophy. No duct dilatation or acute inflammation. Spleen: Normal in size, without focal abnormality. Adrenals/Urinary Tract: Normal adrenal glands. Mild renal cortical thinning bilaterally. 2 mm interpolar left renal collecting system calculus. Bilateral fluid density renal lesions which are likely cysts. Upper pole punctate right renal collecting system calculus. No hydronephrosis. No hydroureter or ureteric calculi. No bladder calculi. Stomach/Bowel: Surgical or biopsy clip at the proximal stomach. Probable lipoma of 8 mm in the inferior genu of the duodenum, present on the prior. The colon is diffusely fluid-filled, consistent with the clinical history of diarrhea. Scattered colonic diverticula. Appendectomy. Small bowel loops are also fluid-filled including up to 4.1 cm. No focal bowel transition to suggest obstruction Vascular/Lymphatic: Aortic atherosclerosis. No abdominopelvic adenopathy. Reproductive: Normal prostate. Other: No abdominopelvic fluid or free intraperitoneal air. Small fat containing paraumbilical hernia. Nonspecific right abdominal wall subcutaneous nodule of 1.2 cm on 54/2. Musculoskeletal: L4-S1 trans pedicle screw fixation. IMPRESSION: 1. Fluid-filled colon, consistent with the clinical history of diarrhea. Mildly dilated small  bowel loops may represent concurrent ileus. No transition point to suggest bowel obstruction. 2. Bilateral nephrolithiasis without obstructive uropathy. 3. Mild subpleural reticulation at the lung bases could represent inte

## 2021-10-26 NOTE — ED Notes (Signed)
Pt is aware that a urine sample is needed. States it will be a while. ?

## 2021-10-26 NOTE — ED Triage Notes (Signed)
[facility] reports pt coming from home c/o weakness, abdominal pain, n/v/d. Pt was recently diagnosed with UTI and started on antibiotics. ?

## 2021-10-26 NOTE — ED Notes (Signed)
ED TO INPATIENT HANDOFF REPORT ? ?ED Nurse Name and Phone #: Baxter Flattery, RN ? ?S ?Name/Age/Gender ?Devin White ?66 y.o. ?male ?Room/Bed: WA17/WA17 ? ?Code Status ?  Code Status: Prior ? ?Home/SNF/Other ?Home ?Patient oriented to: self, place, time, and situation ?Is this baseline? Yes  ? ?Triage Complete: Triage complete  ?Chief Complaint ?Diarrhea [R19.7] ? ?Triage Note ?[facility] reports pt coming from home c/o weakness, abdominal pain, n/v/d. Pt was recently diagnosed with UTI and started on antibiotics.  ? ?Allergies ?No Known Allergies ? ?Level of Care/Admitting Diagnosis ?ED Disposition   ? ? ED Disposition  ?Admit  ? Condition  ?--  ? Comment  ?Hospital Area: [facility] [[id number]] ? Level of Care: Med-Surg [16] ? May place patient in observation at [facility] or [place] if equivalent level of care is available:: No ? Covid Evaluation: Asymptomatic - no recent exposure (last 10 days) testing not required ? Diagnosis: Diarrhea [787.91.ICD-9-CM] ? Admitting Physician: Jonnie Finner [[id number]] ? Attending Physician: Jonnie Finner [[id number]] ?  ?  ? ?  ? ? ?B ?Medical/Surgery History ?Past Medical History:  ?Diagnosis Date  ? Arthritis   ? back and legs  ? Benign prostatic hypertrophy   ? GERD (gastroesophageal reflux disease)   ? Gout   ? History of blood transfusion   ? pt has antibodies in his blood since previous transfusions  ? History of cirrhosis of liver S/P TRANSPLANT 2009  ? History of liver failure S/P TRANSPLANT  ? Hypertension   ? Left ureteral calculus   ? ?Past Surgical History:  ?Procedure Laterality Date  ? APPENDECTOMY    ? bone morrow biopsy    ? CHOLECYSTECTOMY  2007  ? CYSTO/ LEFT RETROGRADE PYELOGRAM/ LEFT URETERAL STENT PLACEMENT  03-05-2012  DR Tresa [facility])  ? LEFT URETERAL CALCULI  ? CYSTOSCOPY W/ URETERAL STENT PLACEMENT  03/11/2012  ? Procedure: CYSTOSCOPY WITH STENT REPLACEMENT;  Surgeon: Alexis Frock, MD;  Location: [facility];  Service: Urology;   Laterality: Left;  ? HERNIA REPAIR  2008  ? LIVER BIOPSY    ? LIVER TRANSPLANT  11/24/2007  ? pt states doing well since liver transplant  ? LUMBAR DISC SURGERY    ? LUMBAR FUSION    ? removal of fistula    ? URETEROSCOPY  03/11/2012  ? Procedure: URETEROSCOPY;  Surgeon: Alexis Frock, MD;  Location: [facility];  Service: Urology;  Laterality: Left;   ?STONE MANIPULATION, stone obtained ? ?[phone] ?[employer] MCR  ?  ? ?A ?IV Location/Drains/Wounds ?Patient Lines/Drains/Airways Status   ? ? Active Line/Drains/Airways   ? ? Name Placement date Placement time Site Days  ? Peripheral IV 10/26/21 20 G 1" Right Antecubital 10/26/21  0832  Antecubital  less than 1  ? Ureteral Drain/Stent Left ureter 6 Fr. 03/11/12  0853  Left ureter  3516  ? Incision 03/05/12 Perineum 03/05/12  1742  -- 3522  ? Incision 03/11/12 Perineum Left 03/11/12  0843  -- 3516  ? ?  ?  ? ?  ? ? ?Intake/Output Last 24 hours ?No intake or output data in the 24 hours ending 10/26/21 1223 ? ?Labs/Imaging ?Results for orders placed or performed during the hospital encounter of 10/26/21 (from the past 48 hour(s))  ?CBC with Differential     Status: Abnormal  ? Collection Time: 10/26/21  8:35 AM  ?Result Value Ref Range  ? WBC 19.1 (H) 4.0 - 10.5 K/uL  ? RBC 5.07 4.22 -  5.81 MIL/uL  ? Hemoglobin 16.8 13.0 - 17.0 g/dL  ? HCT 50.1 39.0 - 52.0 %  ? MCV 98.8 80.0 - 100.0 fL  ? MCH 33.1 26.0 - 34.0 pg  ? MCHC 33.5 30.0 - 36.0 g/dL  ? RDW 13.8 11.5 - 15.5 %  ? Platelets 189 150 - 400 K/uL  ? nRBC 0.0 0.0 - 0.2 %  ? Neutrophils Relative % 67 %  ? Neutro Abs 12.8 (H) 1.7 - 7.7 K/uL  ? Lymphocytes Relative 17 %  ? Lymphs Abs 3.2 0.7 - 4.0 K/uL  ? Monocytes Relative 10 %  ? Monocytes Absolute 1.9 (H) 0.1 - 1.0 K/uL  ? Eosinophils Relative 1 %  ? Eosinophils Absolute 0.3 0.0 - 0.5 K/uL  ? Basophils Relative 1 %  ? Basophils Absolute 0.1 0.0 - 0.1 K/uL  ? Immature Granulocytes 4 %  ? Abs Immature Granulocytes 0.77 (H) 0.00 - 0.07 K/uL  ?  Comment: Performed at  [facility], [place] [address], [place], [place] [address]  ?Comprehensive metabolic panel     Status: Abnormal  ? Collection Time: 10/26/21  8:35 AM  ?Result Value Ref Range  ? Sodium 139 135 - 145 mmol/L  ? Potassium 4.1 3.5 - 5.1 mmol/L  ? Chloride 104 98 - 111 mmol/L  ? CO2 21 (L) 22 - 32 mmol/L  ? Glucose, Bld 174 (H) 70 - 99 mg/dL  ?  Comment: Glucose reference range applies only to samples taken after fasting for at least 8 hours.  ? BUN 23 8 - 23 mg/dL  ? Creatinine, Ser 1.89 (H) 0.61 - 1.24 mg/dL  ? Calcium 10.1 8.9 - 10.3 mg/dL  ? Total Protein 7.4 6.5 - 8.1 g/dL  ? Albumin 4.5 3.5 - 5.0 g/dL  ? AST 31 15 - 41 U/L  ? ALT 106 (H) 0 - 44 U/L  ? Alkaline Phosphatase 47 38 - 126 U/L  ? Total Bilirubin 1.8 (H) 0.3 - 1.2 mg/dL  ? GFR, Estimated 39 (L) >60 mL/min  ?  Comment: (NOTE) ?Calculated using the CKD-EPI Creatinine Equation (2021) ?  ? Anion gap 14 5 - 15  ?  Comment: Performed at [facility], [place] [address], [place], [place] [address]  ?Lactic acid, plasma     Status: None  ? Collection Time: 10/26/21  8:49 AM  ?Result Value Ref Range  ? Lactic Acid, Venous 1.7 0.5 - 1.9 mmol/L  ?  Comment: Performed at [facility], [place] [address] [place], [place] [address]  ? ?CT ABDOMEN PELVIS WO CONTRAST ? ?Result Date: 10/26/2021 ?CLINICAL DATA:  Generalized abdominal pain. Nausea and vomiting. Diarrhea. On antibiotics for urinary tract infection. Liver transplant 12 years ago. EXAM: CT ABDOMEN AND PELVIS WITHOUT CONTRAST TECHNIQUE: Multidetector CT imaging of the abdomen and pelvis was performed following the standard protocol without IV contrast. RADIATION DOSE REDUCTION: This exam was performed according to the departmental dose-optimization program which includes automated exposure control, adjustment of the mA and/or kV according to patient size and/or use of iterative reconstruction technique. COMPARISON:  04/07/2021 abdominal ultrasound.  CT 11/21/2020  FINDINGS: Lower chest: Mild subpleural reticulation at the lung bases. Mild cardiomegaly. Lipomatous hypertrophy of the interatrial septum. Right coronary artery calcification. Fluid a in the distal esophagus on 01/02. Hepatobiliary: Status post liver transplant. Cholecystectomy, without biliary ductal dilatation. Pancreas: Mild pancreatic atrophy. No duct dilatation or acute inflammation. Spleen: Normal in size, without focal abnormality. Adrenals/Urinary Tract: Normal adrenal  glands. Mild renal cortical thinning bilaterally. 2 mm interpolar left renal collecting system calculus. Bilateral fluid density renal lesions which are likely cysts. Upper pole punctate right renal collecting system calculus. No hydronephrosis. No hydroureter or ureteric calculi. No bladder calculi. Stomach/Bowel: Surgical or biopsy clip at the proximal stomach. Probable lipoma of 8 mm in the inferior genu of the duodenum, present on the prior. The colon is diffusely fluid-filled, consistent with the clinical history of diarrhea. Scattered colonic diverticula. Appendectomy. Small bowel loops are also fluid-filled including up to 4.1 cm. No focal bowel transition to suggest obstruction Vascular/Lymphatic: Aortic atherosclerosis. No abdominopelvic adenopathy. Reproductive: Normal prostate. Other: No abdominopelvic fluid or free intraperitoneal air. Small fat containing paraumbilical hernia. Nonspecific right abdominal wall subcutaneous nodule of 1.2 cm on 54/2. Musculoskeletal: L4-S1 trans pedicle screw fixation. IMPRESSION: 1. Fluid-filled colon, consistent with the clinical history of diarrhea. Mildly dilated small bowel loops may represent concurrent ileus. No transition point to suggest bowel obstruction. 2. Bilateral nephrolithiasis without obstructive uropathy. 3. Mild subpleural reticulation at the lung bases could represent interstitial lung disease such as nonspecific interstitial pneumonitis. Correlate with pulmonary symptoms. 4.  Aortic Atherosclerosis (ICD10-I70.0). Coronary artery atherosclerosis. 5. Esophageal air fluid level suggests dysmotility or gastroesophageal reflux. Electronically Signed   By: Abigail Miyamoto M.D.   On: 05/12

## 2021-10-27 DIAGNOSIS — I5032 Chronic diastolic (congestive) heart failure: Secondary | ICD-10-CM | POA: Diagnosis present

## 2021-10-27 DIAGNOSIS — Z944 Liver transplant status: Secondary | ICD-10-CM

## 2021-10-27 DIAGNOSIS — N281 Cyst of kidney, acquired: Secondary | ICD-10-CM | POA: Diagnosis present

## 2021-10-27 DIAGNOSIS — E669 Obesity, unspecified: Secondary | ICD-10-CM | POA: Diagnosis present

## 2021-10-27 DIAGNOSIS — K7460 Unspecified cirrhosis of liver: Secondary | ICD-10-CM | POA: Diagnosis present

## 2021-10-27 DIAGNOSIS — K219 Gastro-esophageal reflux disease without esophagitis: Secondary | ICD-10-CM | POA: Diagnosis present

## 2021-10-27 DIAGNOSIS — Z6835 Body mass index (BMI) 35.0-35.9, adult: Secondary | ICD-10-CM | POA: Diagnosis not present

## 2021-10-27 DIAGNOSIS — N400 Enlarged prostate without lower urinary tract symptoms: Secondary | ICD-10-CM | POA: Diagnosis present

## 2021-10-27 DIAGNOSIS — R739 Hyperglycemia, unspecified: Secondary | ICD-10-CM

## 2021-10-27 DIAGNOSIS — M1909 Primary osteoarthritis, other specified site: Secondary | ICD-10-CM | POA: Diagnosis present

## 2021-10-27 DIAGNOSIS — E785 Hyperlipidemia, unspecified: Secondary | ICD-10-CM | POA: Diagnosis present

## 2021-10-27 DIAGNOSIS — M1A9XX1 Chronic gout, unspecified, with tophus (tophi): Secondary | ICD-10-CM

## 2021-10-27 DIAGNOSIS — E872 Acidosis, unspecified: Secondary | ICD-10-CM | POA: Diagnosis present

## 2021-10-27 DIAGNOSIS — A0472 Enterocolitis due to Clostridium difficile, not specified as recurrent: Secondary | ICD-10-CM | POA: Diagnosis present

## 2021-10-27 DIAGNOSIS — I129 Hypertensive chronic kidney disease with stage 1 through stage 4 chronic kidney disease, or unspecified chronic kidney disease: Secondary | ICD-10-CM | POA: Diagnosis present

## 2021-10-27 DIAGNOSIS — G43009 Migraine without aura, not intractable, without status migrainosus: Secondary | ICD-10-CM | POA: Diagnosis not present

## 2021-10-27 DIAGNOSIS — N179 Acute kidney failure, unspecified: Secondary | ICD-10-CM | POA: Diagnosis present

## 2021-10-27 DIAGNOSIS — Z981 Arthrodesis status: Secondary | ICD-10-CM | POA: Diagnosis not present

## 2021-10-27 DIAGNOSIS — N390 Urinary tract infection, site not specified: Secondary | ICD-10-CM | POA: Diagnosis present

## 2021-10-27 DIAGNOSIS — G43909 Migraine, unspecified, not intractable, without status migrainosus: Secondary | ICD-10-CM | POA: Diagnosis present

## 2021-10-27 DIAGNOSIS — N1831 Chronic kidney disease, stage 3a: Secondary | ICD-10-CM | POA: Diagnosis present

## 2021-10-27 DIAGNOSIS — Z20822 Contact with and (suspected) exposure to covid-19: Secondary | ICD-10-CM | POA: Diagnosis present

## 2021-10-27 DIAGNOSIS — A09 Infectious gastroenteritis and colitis, unspecified: Secondary | ICD-10-CM | POA: Diagnosis not present

## 2021-10-27 DIAGNOSIS — R197 Diarrhea, unspecified: Secondary | ICD-10-CM | POA: Diagnosis present

## 2021-10-27 DIAGNOSIS — I10 Essential (primary) hypertension: Secondary | ICD-10-CM

## 2021-10-27 DIAGNOSIS — M479 Spondylosis, unspecified: Secondary | ICD-10-CM | POA: Diagnosis present

## 2021-10-27 DIAGNOSIS — D649 Anemia, unspecified: Secondary | ICD-10-CM | POA: Diagnosis not present

## 2021-10-27 DIAGNOSIS — E860 Dehydration: Secondary | ICD-10-CM | POA: Diagnosis present

## 2021-10-27 DIAGNOSIS — R29818 Other symptoms and signs involving the nervous system: Secondary | ICD-10-CM | POA: Diagnosis not present

## 2021-10-27 DIAGNOSIS — G459 Transient cerebral ischemic attack, unspecified: Secondary | ICD-10-CM | POA: Diagnosis not present

## 2021-10-27 LAB — GASTROINTESTINAL PANEL BY PCR, STOOL (REPLACES STOOL CULTURE)

## 2021-10-27 LAB — URINALYSIS, ROUTINE W REFLEX MICROSCOPIC
Bilirubin Urine: NEGATIVE
Glucose, UA: NEGATIVE mg/dL
Hgb urine dipstick: NEGATIVE
Ketones, ur: NEGATIVE mg/dL
Leukocytes,Ua: NEGATIVE
Nitrite: NEGATIVE
Protein, ur: NEGATIVE mg/dL
Specific Gravity, Urine: 1.011 (ref 1.005–1.030)
pH: 5 (ref 5.0–8.0)

## 2021-10-27 LAB — COMPREHENSIVE METABOLIC PANEL
ALT: 68 U/L — ABNORMAL HIGH (ref 0–44)
AST: 20 U/L (ref 15–41)
Albumin: 3.7 g/dL (ref 3.5–5.0)
Alkaline Phosphatase: 37 U/L — ABNORMAL LOW (ref 38–126)
Anion gap: 7 (ref 5–15)
BUN: 30 mg/dL — ABNORMAL HIGH (ref 8–23)
CO2: 21 mmol/L — ABNORMAL LOW (ref 22–32)
Calcium: 8.7 mg/dL — ABNORMAL LOW (ref 8.9–10.3)
Chloride: 110 mmol/L (ref 98–111)
Creatinine, Ser: 2.49 mg/dL — ABNORMAL HIGH (ref 0.61–1.24)
GFR, Estimated: 28 mL/min — ABNORMAL LOW (ref >60)
Glucose, Bld: 129 mg/dL — ABNORMAL HIGH (ref 70–99)
Potassium: 3.9 mmol/L (ref 3.5–5.1)
Sodium: 138 mmol/L (ref 135–145)
Total Bilirubin: 1.4 mg/dL — ABNORMAL HIGH (ref 0.3–1.2)
Total Protein: 5.7 g/dL — ABNORMAL LOW (ref 6.5–8.1)

## 2021-10-27 LAB — GLUCOSE, CAPILLARY
Glucose-Capillary: 130 mg/dL — ABNORMAL HIGH (ref 70–99)
Glucose-Capillary: 108 mg/dL — ABNORMAL HIGH (ref 70–99)

## 2021-10-27 LAB — LACTIC ACID, PLASMA
Lactic Acid, Venous: 1.2 mmol/L (ref 0.5–1.9)
Lactic Acid, Venous: 1.5 mmol/L (ref 0.5–1.9)

## 2021-10-27 LAB — CBC
HCT: 39.8 % (ref 39.0–52.0)
Hemoglobin: 13.1 g/dL (ref 13.0–17.0)
MCH: 33.1 pg (ref 26.0–34.0)
MCHC: 32.9 g/dL (ref 30.0–36.0)
MCV: 100.5 fL — ABNORMAL HIGH (ref 80.0–100.0)
Platelets: 126 10*3/uL — ABNORMAL LOW (ref 150–400)
RBC: 3.96 MIL/uL — ABNORMAL LOW (ref 4.22–5.81)
RDW: 14.2 % (ref 11.5–15.5)
WBC: 11.9 10*3/uL — ABNORMAL HIGH (ref 4.0–10.5)
nRBC: 0 % (ref 0.0–0.2)

## 2021-10-27 LAB — RESP PANEL BY RT-PCR (FLU A&B, COVID) ARPGX2
Influenza A by PCR: NEGATIVE
Influenza B by PCR: NEGATIVE
SARS Coronavirus 2 by RT PCR: NEGATIVE

## 2021-10-27 NOTE — Progress Notes (Signed)
?PROGRESS NOTE ? ? ? ?Devin White  BOF:[id number] DOB: 01-17-1956 DOA: 10/26/2021 ?PCP: Venia Carbon, MD  ? ? ? ?Brief Narrative:  ? 66 y.o. WM PMHx liver transplant, HTN, BPH, GERD.  ? ?Presenting with N/V/D. He was recently discharged from [facility] for recurrent UTI. He was finishing a course of abx and felt nauseous each time he took the medication (ceftin). He began having diarrhea as well. He reports over 30 episodes of diarrhea since initiating the oral antibiotic. He has also had episodes of vomiting. This morning he felt extremely week and had a fever of 101. So he decided to come to the ED for evaluation. He denies any other aggravating or alleviating factors.  ? ? ?Subjective: ?A/O x4, patient positive abdominal pain, Positive bilateral CVA tenderness RIGHT>>> LEFT.  Positive bladder pain.  Positive dysuria ? ? ?Assessment & Plan: ?Covid vaccination; ?  ?Principal Problem: ?  Diarrhea ?Active Problems: ?  Chronic tophaceous gout ?  GERD ?  BPH without urinary obstruction ?  Hypertension ?  Liver transplant status ([place]) ?  Nausea and vomiting ?  Stage 3a chronic kidney disease (CKD) ([place]) ?  AKI (acute kidney injury) ([place]) ?  Hyperbilirubinemia ?  Hyperglycemia ?  Obesity (BMI 30-39.9) ? ?Liver transplant ?-Mycophenolate 500 mg every morning ?- Mycophenolate 250 mg every evening ?- Prednisone 10 mg daily ?- Tacrolimus 0.5 mg every morning ?- Tacrolimus 1 mg at bedtime ?-5/13 given patient's dehydration Tacrolimus level pending ? ?Positive C. difficile Diarrhea ?- Complete course of vancomycin 125 mg QID ? ? ?UTI? ?- Patient was unable to complete his course of antibiotics prescribed at [facility] for UTI. ?- 5/13 urine culture pending.  May consider kidney ultrasound ?  ?AKI on CKD 3a(baseline Cr~1.2 - 1.4); he's at 1.89 today ?-Avoid nephrotoxins ? ?Lab Results  ?Component Value Date  ? CREATININE 2.49 (H) 10/27/2021  ? CREATININE 1.89 (H) 10/26/2021  ? CREATININE 1.26 (H) 09/14/2021  ?  CREATININE 1.47 (H) 04/06/2021  ? CREATININE 1.47 12/12/2020  ?-Patient dehydrated normal saline 161m/hr ?  ?Essential HTN ?    ?  ?Hyperbilirubinemia ?    - check fractionated bili ?  ?Hyperglycemia ?-Resolved ?- 5/12 Hemoglobin A1c= 6.2    ?  ?BPH ?    - continue home regimen ?  ?Gout ?    - hold home colchicine ?  ?GERD ?    - hold PPI ? ?Obesity (BMI 35.84 kg/m?). ?-Address with PCP ?  ? ? ?  ? ? ?  ?Mobility Assessment (last 72 hours)   ? ? Mobility Assessment   ? ? RNichollsName 10/27/21 0778205/12/23 1948 10/26/21 1300  ?  ?  ? Does patient have an order for bedrest or is patient medically unstable No - Continue assessment No - Continue assessment No - Continue assessment    ? What is the highest level of mobility based on the progressive mobility assessment? Level 6 (Walks independently in room and hall) - Balance while walking in room without assist - Complete Level 6 (Walks independently in room and hall) - Balance while walking in room without assist - Complete Level 6 (Walks independently in room and hall) - Balance while walking in room without assist - Complete    ? ?  ?  ? ?  ? ? ? ?'@IPAL'$ @ ? ?   ?DVT prophylaxis:  ?Code Status: Full ?Family Communication:  ?Status is: Inpatient ? ? ? ?Dispo: The patient is from:  ?  Anticipated d/c is to:  ?             Anticipated d/c date is:  ?             Patient currently  ? ? ? ? ? ?Consultants:  ? ? ?Procedures/Significant Events:  ? ? ?I have personally reviewed and interpreted all radiology studies and my findings are as above. ? ?VENTILATOR SETTINGS: ? ? ? ?Cultures ?5/12 blood left arm NGTD ?5/12 blood left hand NGTD ?5/12 HIV nonreactive ?5/12 positive C. difficile antigen/toxigenic C. difficile by PCR ?5/13 influenza A/B pending ?5/13 SARS coronavirus pending ?5/13 urine pending ? ? ?Antimicrobials: ?Anti-infectives (From admission, onward)  ? ? Start     Ordered Stop  ? 10/26/21 1915  vancomycin (VANCOCIN) capsule 125 mg       ? 10/26/21 1827  11/05/21 1759  ? ?  ?  ? ? ?Devices ?  ? ?LINES / TUBES:  ? ? ? ? ?Continuous Infusions: ? sodium chloride 100 mL/hr at 10/27/21 0141  ? ? ? ?Objective: ?Vitals:  ? 10/26/21 2129 10/27/21 0230 10/27/21 0528 10/27/21 1422  ?BP: 100/66 105/70 138/75 127/75  ?Pulse: 90 92 80 91  ?Resp: '18 18 18 18  '$ ?Temp: 97.8 ?F (36.6 ?C) 97.9 ?F (36.6 ?C) 97.8 ?F (36.6 ?C) 97.8 ?F (36.6 ?C)  ?TempSrc: Oral Oral Oral Oral  ?SpO2: 97% 97% 98% 95%  ?Weight:      ?Height:      ? ? ?Intake/Output Summary (Last 24 hours) at 10/27/2021 1921 ?Last data filed at 10/27/2021 1649 ?Gross per 24 hour  ?Intake 2135.03 ml  ?Output 630 ml  ?Net 1505.03 ml  ? ?Filed Weights  ? 10/26/21 0830  ?Weight: 116.6 kg  ? ? ?Examination: ? ?General: A/O x4, No acute respiratory distress ?Eyes: negative scleral hemorrhage, negative anisocoria, negative icterus ?ENT: Negative Runny nose, negative gingival bleeding, ?Neck:  Negative scars, masses, torticollis, lymphadenopathy, JVD ?Lungs: Clear to auscultation bilaterally without wheezes or crackles ?Cardiovascular: Regular rate and rhythm without murmur gallop or rub normal S1 and S2 ?Abdomen: OBESE, positive abdominal pain, nondistended, positive soft, bowel sounds, no rebound, no ascites, no appreciable mass,positive CVA tenderness ?Extremities: No significant cyanosis, clubbing, or edema bilateral lower extremities ?Skin: Negative rashes, lesions, ulcers ?Psychiatric:  Negative depression, negative anxiety, negative fatigue, negative mania  ?Central nervous system:  Cranial nerves II through XII intact, tongue/uvula midline, all extremities muscle strength 5/5, sensation intact throughout,  negative dysarthria, negative expressive aphasia, negative receptive aphasia. ? ?.  ? ? ? ?Data Reviewed: Care during the described time interval was provided by me .  I have reviewed this patient's available data, including medical history, events of note, physical examination, and all test results as part of my  evaluation. ? ?CBC: ?Recent Labs  ?Lab 10/26/21 ?9024 10/27/21 ?0522  ?WBC 19.1* 11.9*  ?NEUTROABS 12.8*  --   ?HGB 16.8 13.1  ?HCT 50.1 39.8  ?MCV 98.8 100.5*  ?PLT 189 126*  ? ?Basic Metabolic Panel: ?Recent Labs  ?Lab 10/26/21 ?0973 10/27/21 ?0522  ?NA 139 138  ?K 4.1 3.9  ?CL 104 110  ?CO2 21* 21*  ?GLUCOSE 174* 129*  ?BUN 23 30*  ?CREATININE 1.89* 2.49*  ?CALCIUM 10.1 8.7*  ? ?GFR: ?Estimated Creatinine Clearance: 39.4 mL/min (A) (by C-G formula based on SCr of 2.49 mg/dL (H)). ?Liver Function Tests: ?Recent Labs  ?Lab 10/26/21 ?5329 10/26/21 ?1350 10/27/21 ?0522  ?AST 31  --  20  ?ALT 106*  --  68*  ?ALKPHOS 47  --  37*  ?BILITOT 1.8* 1.8* 1.4*  ?PROT 7.4  --  5.7*  ?ALBUMIN 4.5  --  3.7  ? ?No results for input(s): LIPASE, AMYLASE in the last 168 hours. ?No results for input(s): AMMONIA in the last 168 hours. ?Coagulation Profile: ?No results for input(s): INR, PROTIME in the last 168 hours. ?Cardiac Enzymes: ?No results for input(s): CKTOTAL, CKMB, CKMBINDEX, TROPONINI in the last 168 hours. ?BNP (last 3 results) ?No results for input(s): PROBNP in the last 8760 hours. ?HbA1C: ?Recent Labs  ?  10/26/21 ?1350  ?HGBA1C 6.2*  ? ?CBG: ?Recent Labs  ?Lab 10/27/21 ?0426 10/27/21 ?0729  ?GLUCAP 130* 108*  ? ?Lipid Profile: ?No results for input(s): CHOL, HDL, LDLCALC, TRIG, CHOLHDL, LDLDIRECT in the last 72 hours. ?Thyroid Function Tests: ?No results for input(s): TSH, T4TOTAL, FREET4, T3FREE, THYROIDAB in the last 72 hours. ?Anemia Panel: ?No results for input(s): VITAMINB12, FOLATE, FERRITIN, TIBC, IRON, RETICCTPCT in the last 72 hours. ?Sepsis Labs: ?Recent Labs  ?Lab 10/26/21 ?8588 10/26/21 ?1314 10/27/21 ?0840 10/27/21 ?1154  ?LATICACIDVEN 1.7 2.7* 1.2 1.5  ? ? ?Recent Results (from the past 240 hour(s))  ?Gastrointestinal Panel by PCR , Stool     Status: None  ? Collection Time: 10/26/21  8:49 AM  ? Specimen: Stool  ?Result Value Ref Range Status  ? Campylobacter species NOT DETECTED NOT DETECTED Final  ?  Plesimonas shigelloides NOT DETECTED NOT DETECTED Final  ? Salmonella species NOT DETECTED NOT DETECTED Final  ? Yersinia enterocolitica NOT DETECTED NOT DETECTED Final  ? Vibrio species NOT DETECTED NOT DETECTED Final  ?

## 2021-10-28 ENCOUNTER — Inpatient Hospital Stay (HOSPITAL_COMMUNITY): Payer: Medicare Other

## 2021-10-28 DIAGNOSIS — N179 Acute kidney failure, unspecified: Secondary | ICD-10-CM | POA: Diagnosis not present

## 2021-10-28 DIAGNOSIS — M1A9XX1 Chronic gout, unspecified, with tophus (tophi): Secondary | ICD-10-CM | POA: Diagnosis not present

## 2021-10-28 DIAGNOSIS — A09 Infectious gastroenteritis and colitis, unspecified: Secondary | ICD-10-CM

## 2021-10-28 DIAGNOSIS — N400 Enlarged prostate without lower urinary tract symptoms: Secondary | ICD-10-CM | POA: Diagnosis not present

## 2021-10-28 DIAGNOSIS — R197 Diarrhea, unspecified: Secondary | ICD-10-CM | POA: Diagnosis not present

## 2021-10-28 LAB — CBC WITH DIFFERENTIAL/PLATELET
Abs Immature Granulocytes: 0.45 10*3/uL — ABNORMAL HIGH (ref 0.00–0.07)
Basophils Absolute: 0 10*3/uL (ref 0.0–0.1)
Basophils Relative: 0 %
Eosinophils Absolute: 0.2 10*3/uL (ref 0.0–0.5)
Eosinophils Relative: 2 %
HCT: 34.5 % — ABNORMAL LOW (ref 39.0–52.0)
Hemoglobin: 11.6 g/dL — ABNORMAL LOW (ref 13.0–17.0)
Immature Granulocytes: 4 %
Lymphocytes Relative: 20 %
Lymphs Abs: 2.1 10*3/uL (ref 0.7–4.0)
MCH: 33.5 pg (ref 26.0–34.0)
MCHC: 33.6 g/dL (ref 30.0–36.0)
MCV: 99.7 fL (ref 80.0–100.0)
Monocytes Absolute: 1.3 10*3/uL — ABNORMAL HIGH (ref 0.1–1.0)
Monocytes Relative: 12 %
Neutro Abs: 6.3 10*3/uL (ref 1.7–7.7)
Neutrophils Relative %: 62 %
Platelets: 119 10*3/uL — ABNORMAL LOW (ref 150–400)
RBC: 3.46 MIL/uL — ABNORMAL LOW (ref 4.22–5.81)
RDW: 13.9 % (ref 11.5–15.5)
WBC: 10.4 10*3/uL (ref 4.0–10.5)
nRBC: 0 % (ref 0.0–0.2)

## 2021-10-28 LAB — COMPREHENSIVE METABOLIC PANEL
ALT: 47 U/L — ABNORMAL HIGH (ref 0–44)
AST: 12 U/L — ABNORMAL LOW (ref 15–41)
Albumin: 3.2 g/dL — ABNORMAL LOW (ref 3.5–5.0)
Alkaline Phosphatase: 34 U/L — ABNORMAL LOW (ref 38–126)
Anion gap: 7 (ref 5–15)
BUN: 26 mg/dL — ABNORMAL HIGH (ref 8–23)
CO2: 19 mmol/L — ABNORMAL LOW (ref 22–32)
Calcium: 8.9 mg/dL (ref 8.9–10.3)
Chloride: 115 mmol/L — ABNORMAL HIGH (ref 98–111)
Creatinine, Ser: 1.39 mg/dL — ABNORMAL HIGH (ref 0.61–1.24)
GFR, Estimated: 57 mL/min — ABNORMAL LOW (ref >60)
Glucose, Bld: 118 mg/dL — ABNORMAL HIGH (ref 70–99)
Potassium: 4.2 mmol/L (ref 3.5–5.1)
Sodium: 141 mmol/L (ref 135–145)
Total Bilirubin: 1 mg/dL (ref 0.3–1.2)
Total Protein: 5.5 g/dL — ABNORMAL LOW (ref 6.5–8.1)

## 2021-10-28 LAB — MAGNESIUM: Magnesium: 1.8 mg/dL (ref 1.7–2.4)

## 2021-10-28 LAB — PHOSPHORUS: Phosphorus: 3.3 mg/dL (ref 2.5–4.6)

## 2021-10-28 LAB — BILIRUBIN, FRACTIONATED(TOT/DIR/INDIR)
Bilirubin, Direct: 0.4 mg/dL — ABNORMAL HIGH (ref 0.0–0.2)
Indirect Bilirubin: 1.4 mg/dL — ABNORMAL HIGH (ref 0.3–0.9)
Total Bilirubin: 1.8 mg/dL — ABNORMAL HIGH (ref 0.3–1.2)

## 2021-10-28 LAB — GLUCOSE, CAPILLARY: Glucose-Capillary: 118 mg/dL — ABNORMAL HIGH (ref 70–99)

## 2021-10-28 MED ORDER — ALUM & MAG HYDROXIDE-SIMETH 200-200-20 MG/5ML PO SUSP
30.0000 mL | ORAL | Status: DC | PRN
Start: 2021-10-28 — End: 2021-10-28
  Administered 2021-10-28: 30 mL via ORAL
  Filled 2021-10-28: qty 30

## 2021-10-28 MED ORDER — ALUM & MAG HYDROXIDE-SIMETH 200-200-20 MG/5ML PO SUSP
30.0000 mL | Freq: Four times a day (QID) | ORAL | Status: DC
  Administered 2021-10-28 – 2021-11-07 (×39): 30 mL via ORAL
  Filled 2021-10-28 (×40): qty 30

## 2021-10-28 MED ORDER — TAMSULOSIN HCL 0.4 MG PO CAPS
0.4000 mg | ORAL_CAPSULE | Freq: Two times a day (BID) | ORAL | Status: DC
  Administered 2021-10-28 – 2021-11-07 (×20): 0.4 mg via ORAL
  Filled 2021-10-28 (×21): qty 1

## 2021-10-28 MED ORDER — AMLODIPINE BESYLATE 5 MG PO TABS
5.0000 mg | ORAL_TABLET | Freq: Every day | ORAL | Status: DC
  Administered 2021-10-28 – 2021-11-02 (×6): 5 mg via ORAL
  Filled 2021-10-28 (×6): qty 1

## 2021-10-28 NOTE — Progress Notes (Signed)
?  Transition of Care (TOC) Screening Note ? ? ?Patient Details  ?Name: Devin White ?Date of Birth: [birth year] ? ? ?Transition of Care (TOC) CM/SW Contact:    ?Alesandro Stueve, LCSW ?Phone Number: ?10/28/2021, 9:07 AM ? ? ? ?Transition of Care Department [facility]) has reviewed patient and no TOC needs have been identified at this time. We will continue to monitor patient advancement through interdisciplinary progression rounds. If new patient transition needs arise, please place a TOC consult. ? ? ?

## 2021-10-28 NOTE — Progress Notes (Signed)
?PROGRESS NOTE ? ? ? ?Devin White  LKG:[id number] DOB: [birth year] DOA: 10/26/2021 ?PCP: Venia Carbon, MD  ? ? ? ?Brief Narrative:  ? 66 y.o. WM PMHx liver transplant, HTN, BPH, GERD.  ? ?Presenting with N/V/D. He was recently discharged from [facility] for recurrent UTI. He was finishing a course of abx and felt nauseous each time he took the medication (ceftin). He began having diarrhea as well. He reports over 30 episodes of diarrhea since initiating the oral antibiotic. He has also had episodes of vomiting. This morning he felt extremely week and had a fever of 101. So he decided to come to the ED for evaluation. He denies any other aggravating or alleviating factors.  ? ? ?Subjective: ?5/14 afebrile overnight A/O x4.  Continued positive abdominal pain but decreasing in sharpness.  Positive watery diarrhea however episodes of diarrhea have decreased. ?Positive bilateral CVA tenderness RIGHT>>> LEFT.  Positive bladder pain.  Positive dysuria ? ? ?Assessment & Plan: ?Covid vaccination; ?  ?Principal Problem: ?  Diarrhea ?Active Problems: ?  Chronic tophaceous gout ?  GERD ?  BPH without urinary obstruction ?  Hypertension ?  Liver transplant status ([place]) ?  Nausea and vomiting ?  Stage 3a chronic kidney disease (CKD) ([place]) ?  AKI (acute kidney injury) ([place]) ?  Hyperbilirubinemia ?  Hyperglycemia ?  Obesity (BMI 30-39.9) ? ?Liver transplant ?-Mycophenolate 500 mg every morning ?- Mycophenolate 250 mg every evening ?- Prednisone 10 mg daily ?- Tacrolimus 0.5 mg every morning ?- Tacrolimus 1 mg at bedtime ?-5/13 given patient's dehydration Tacrolimus level pending ? ?Positive C. difficile Diarrhea ?- Complete course of vancomycin 125 mg QID ? ?UTI? ?- Patient was unable to complete his course of antibiotics prescribed at [facility] for UTI. ?- 5/13 urine culture pending.  ?-5/14 US renal pending ?  ?AKI on CKD 3a(baseline Cr~1.2 - 1.4); ?-Avoid nephrotoxins ? ?Lab Results  ?Component Value Date  ?  CREATININE 1.39 (H) 10/28/2021  ? CREATININE 2.49 (H) 10/27/2021  ? CREATININE 1.89 (H) 10/26/2021  ? CREATININE 1.26 (H) 09/14/2021  ? CREATININE 1.47 (H) 04/06/2021  ?-Patient dehydrated normal saline 178m/hr ?-5/14 trending down. ?  ?Essential HTN ?-5/14 Amlodipine 5 mg daily    ?  ?Hyperbilirubinemia ?-5/14 fractionated bilirubin pending ?  ?Hyperglycemia ?-Resolved ?- 5/12 Hemoglobin A1c= 6.2    ?  ?BPH ?-5/14 increase Flomax 0.4 mg BID ?  ?Gout ?- hold home colchicine ?  ?GERD ?- hold PPI ? ?Obesity (BMI 35.84 kg/m?). ?-Address with PCP ?  ? ? ?  ? ? ?  ?Mobility Assessment (last 72 hours)   ? ? Mobility Assessment   ? ? [place] Heights[place] 10/27/21 2146 10/27/21 [id number]/12/[id number] 1948 10/26/21 1300  ?  ? Does patient have an order for bedrest or is patient medically unstable No - Continue assessment No - Continue assessment No - Continue assessment No - Continue assessment   ? What is the highest level of mobility based on the progressive mobility assessment? Level 6 (Walks independently in room and hall) - Balance while walking in room without assist - Complete Level 6 (Walks independently in room and hall) - Balance while walking in room without assist - Complete Level 6 (Walks independently in room and hall) - Balance while walking in room without assist - Complete Level 6 (Walks independently in room and hall) - Balance while walking in room without assist - Complete   ? ?  ?  ? ?  ? ? ? ?@  IPAL@ ? ?   ?DVT prophylaxis:  ?Code Status: Full ?Family Communication:  ?Status is: Inpatient ? ? ? ?Dispo: The patient is from: Home ?             Anticipated d/c is to: Home ?             Anticipated d/c date is: >3 days ?             Patient currently Unstable ? ? ? ? ? ?Consultants:  ? ? ?Procedures/Significant Events:  ? ? ?I have personally reviewed and interpreted all radiology studies and my findings are as above. ? ?VENTILATOR SETTINGS: ? ? ? ?Cultures ?5/12 blood left arm NGTD ?5/12 blood left hand NGTD ?5/12 HIV  nonreactive ?5/12 positive C. difficile antigen/toxigenic C. difficile by PCR ?5/13 influenza A/B pending ?5/13 SARS coronavirus pending ?5/13 urine pending ? ? ?Antimicrobials: ?Anti-infectives (From admission, onward)  ? ? Start     Ordered Stop  ? 10/26/21 1915  vancomycin (VANCOCIN) capsule 125 mg       ? 10/26/21 1827 11/05/21 1759  ? ?  ?  ? ? ?Devices ?  ? ?LINES / TUBES:  ? ? ? ? ?Continuous Infusions: ? sodium chloride 100 mL/hr at 10/28/21 1129  ? ? ? ?Objective: ?Vitals:  ? 10/27/21 0528 10/27/21 1422 10/27/21 2208 10/28/21 0503  ?BP: 138/75 127/75 139/86 (!) 144/85  ?Pulse: 80 91 76 72  ?Resp: '18 18 16 18  '$ ?Temp: 97.8 ?F (36.6 ?C) 97.8 ?F (36.6 ?C) 98.4 ?F (36.9 ?C) 98.2 ?F (36.8 ?C)  ?TempSrc: Oral Oral Oral Oral  ?SpO2: 98% 95% 97% 96%  ?Weight:      ?Height:      ? ? ?Intake/Output Summary (Last 24 hours) at 10/28/2021 1141 ?Last data filed at 10/28/2021 1000 ?Gross per 24 hour  ?Intake 3086.19 ml  ?Output 1050 ml  ?Net 2036.19 ml  ? ? ?Filed Weights  ? 10/26/21 0830  ?Weight: 116.6 kg  ? ? ?Examination: ? ?General: A/O x4, No acute respiratory distress ?Eyes: negative scleral hemorrhage, negative anisocoria, negative icterus ?ENT: Negative Runny nose, negative gingival bleeding, ?Neck:  Negative scars, masses, torticollis, lymphadenopathy, JVD ?Lungs: Clear to auscultation bilaterally without wheezes or crackles ?Cardiovascular: Regular rate and rhythm without murmur gallop or rub normal S1 and S2 ?Abdomen: OBESE, positive abdominal pain, nondistended, positive soft, bowel sounds, no rebound, no ascites, no appreciable mass,positive CVA tenderness ?Extremities: No significant cyanosis, clubbing, or edema bilateral lower extremities ?Skin: Negative rashes, lesions, ulcers ?Psychiatric:  Negative depression, negative anxiety, negative fatigue, negative mania  ?Central nervous system:  Cranial nerves II through XII intact, tongue/uvula midline, all extremities muscle strength 5/5, sensation intact  throughout,  negative dysarthria, negative expressive aphasia, negative receptive aphasia. ? ?.  ? ? ? ?Data Reviewed: Care during the described time interval was provided by me .  I have reviewed this patient's available data, including medical history, events of note, physical examination, and all test results as part of my evaluation. ? ?CBC: ?Recent Labs  ?Lab 10/26/21 ?5361 10/27/21 ?0522 10/28/21 ?0431  ?WBC 19.1* 11.9* 10.4  ?NEUTROABS 12.8*  --  6.3  ?HGB 16.8 13.1 11.6*  ?HCT 50.1 39.8 34.5*  ?MCV 98.8 100.5* 99.7  ?PLT 189 126* 119*  ? ? ?Basic Metabolic Panel: ?Recent Labs  ?Lab 10/26/21 ?4431 10/27/21 ?0522 10/28/21 ?0431  ?NA 139 138 141  ?K 4.1 3.9 4.2  ?CL 104 110 115*  ?CO2 21* 21* 19*  ?GLUCOSE 174*  129* 118*  ?BUN 23 30* 26*  ?CREATININE 1.89* 2.49* 1.39*  ?CALCIUM 10.1 8.7* 8.9  ?MG  --   --  1.8  ?PHOS  --   --  3.3  ? ? ?GFR: ?Estimated Creatinine Clearance: 70.6 mL/min (A) (by C-G formula based on SCr of 1.39 mg/dL (H)). ?Liver Function Tests: ?Recent Labs  ?Lab 10/26/21 ?8242 10/26/21 ?1350 10/27/21 ?0522 10/28/21 ?0431  ?AST 31  --  20 12*  ?ALT 106*  --  68* 47*  ?ALKPHOS 47  --  37* 34*  ?BILITOT 1.8* 1.8* 1.4* 1.0  ?PROT 7.4  --  5.7* 5.5*  ?ALBUMIN 4.5  --  3.7 3.2*  ? ? ?No results for input(s): LIPASE, AMYLASE in the last 168 hours. ?No results for input(s): AMMONIA in the last 168 hours. ?Coagulation Profile: ?No results for input(s): INR, PROTIME in the last 168 hours. ?Cardiac Enzymes: ?No results for input(s): CKTOTAL, CKMB, CKMBINDEX, TROPONINI in the last 168 hours. ?BNP (last 3 results) ?No results for input(s): PROBNP in the last 8760 hours. ?HbA1C: ?Recent Labs  ?  10/26/21 ?1350  ?HGBA1C 6.2*  ? ? ?CBG: ?Recent Labs  ?Lab 10/27/21 ?0426 10/27/21 ?0729 10/28/21 ?0735  ?GLUCAP 130* 108* 118*  ? ? ?Lipid Profile: ?No results for input(s): CHOL, HDL, LDLCALC, TRIG, CHOLHDL, LDLDIRECT in the last 72 hours. ?Thyroid Function Tests: ?No results for input(s): TSH, T4TOTAL, FREET4,  T3FREE, THYROIDAB in the last 72 hours. ?Anemia Panel: ?No results for input(s): VITAMINB12, FOLATE, FERRITIN, TIBC, IRON, RETICCTPCT in the last 72 hours. ?Sepsis Labs: ?Recent Labs  ?Lab 10/26/21 ?3536 10/26/21 ?1314 05/13

## 2021-10-29 ENCOUNTER — Other Ambulatory Visit (HOSPITAL_COMMUNITY): Payer: Self-pay

## 2021-10-29 ENCOUNTER — Inpatient Hospital Stay (HOSPITAL_COMMUNITY): Payer: Medicare Other

## 2021-10-29 DIAGNOSIS — M1A9XX1 Chronic gout, unspecified, with tophus (tophi): Secondary | ICD-10-CM | POA: Diagnosis not present

## 2021-10-29 DIAGNOSIS — R197 Diarrhea, unspecified: Secondary | ICD-10-CM | POA: Diagnosis not present

## 2021-10-29 DIAGNOSIS — N179 Acute kidney failure, unspecified: Secondary | ICD-10-CM | POA: Diagnosis not present

## 2021-10-29 DIAGNOSIS — N400 Enlarged prostate without lower urinary tract symptoms: Secondary | ICD-10-CM | POA: Diagnosis not present

## 2021-10-29 LAB — COMPREHENSIVE METABOLIC PANEL
ALT: 40 U/L (ref 0–44)
AST: 13 U/L — ABNORMAL LOW (ref 15–41)
Albumin: 3.1 g/dL — ABNORMAL LOW (ref 3.5–5.0)
Alkaline Phosphatase: 32 U/L — ABNORMAL LOW (ref 38–126)
Anion gap: 6 (ref 5–15)
BUN: 18 mg/dL (ref 8–23)
CO2: 20 mmol/L — ABNORMAL LOW (ref 22–32)
Calcium: 8.4 mg/dL — ABNORMAL LOW (ref 8.9–10.3)
Chloride: 112 mmol/L — ABNORMAL HIGH (ref 98–111)
Creatinine, Ser: 1.16 mg/dL (ref 0.61–1.24)
GFR, Estimated: >60 mL/min (ref >60)
Glucose, Bld: 121 mg/dL — ABNORMAL HIGH (ref 70–99)
Potassium: 4.2 mmol/L (ref 3.5–5.1)
Sodium: 138 mmol/L (ref 135–145)
Total Bilirubin: 1.2 mg/dL (ref 0.3–1.2)
Total Protein: 5.5 g/dL — ABNORMAL LOW (ref 6.5–8.1)

## 2021-10-29 LAB — CBC WITH DIFFERENTIAL/PLATELET
Abs Immature Granulocytes: 0.35 10*3/uL — ABNORMAL HIGH (ref 0.00–0.07)
Basophils Absolute: 0 10*3/uL (ref 0.0–0.1)
Basophils Relative: 0 %
Eosinophils Absolute: 0.2 10*3/uL (ref 0.0–0.5)
Eosinophils Relative: 2 %
HCT: 32.5 % — ABNORMAL LOW (ref 39.0–52.0)
Hemoglobin: 11.1 g/dL — ABNORMAL LOW (ref 13.0–17.0)
Immature Granulocytes: 4 %
Lymphocytes Relative: 19 %
Lymphs Abs: 1.7 10*3/uL (ref 0.7–4.0)
MCH: 33.7 pg (ref 26.0–34.0)
MCHC: 34.2 g/dL (ref 30.0–36.0)
MCV: 98.8 fL (ref 80.0–100.0)
Monocytes Absolute: 1 10*3/uL (ref 0.1–1.0)
Monocytes Relative: 11 %
Neutro Abs: 5.7 10*3/uL (ref 1.7–7.7)
Neutrophils Relative %: 64 %
Platelets: 122 10*3/uL — ABNORMAL LOW (ref 150–400)
RBC: 3.29 MIL/uL — ABNORMAL LOW (ref 4.22–5.81)
RDW: 13.6 % (ref 11.5–15.5)
WBC: 8.9 10*3/uL (ref 4.0–10.5)
nRBC: 0 % (ref 0.0–0.2)

## 2021-10-29 LAB — URINE CULTURE: Culture: NO GROWTH

## 2021-10-29 LAB — PSA: Prostatic Specific Antigen: 0.45 ng/mL (ref 0.00–4.00)

## 2021-10-29 LAB — MAGNESIUM: Magnesium: 2 mg/dL (ref 1.7–2.4)

## 2021-10-29 LAB — PHOSPHORUS: Phosphorus: 2.5 mg/dL (ref 2.5–4.6)

## 2021-10-29 MED ORDER — IOHEXOL 9 MG/ML PO SOLN
500.0000 mL | ORAL | Status: AC
  Administered 2021-10-29 (×2): 500 mL via ORAL

## 2021-10-29 MED ORDER — KETOROLAC TROMETHAMINE 30 MG/ML IJ SOLN
30.0000 mg | Freq: Once | INTRAMUSCULAR | Status: AC
  Administered 2021-10-29: 30 mg via INTRAVENOUS
  Filled 2021-10-29: qty 1

## 2021-10-29 MED ORDER — IOHEXOL 9 MG/ML PO SOLN
ORAL | Status: AC
  Filled 2021-10-29: qty 1000

## 2021-10-29 MED ORDER — ADULT MULTIVITAMIN W/MINERALS CH
1.0000 | ORAL_TABLET | Freq: Every day | ORAL | Status: DC
  Administered 2021-10-29 – 2021-11-07 (×10): 1 via ORAL
  Filled 2021-10-29 (×10): qty 1

## 2021-10-29 NOTE — Progress Notes (Signed)
?PROGRESS NOTE ? ? ? ?Devin White  YSA:[id number] DOB: 1956/02/18 DOA: 10/26/2021 ?PCP: Venia Carbon, MD  ? ? ? ?Brief Narrative:  ? 66 y.o. WM PMHx liver transplant, HTN, BPH, GERD.  ? ?Presenting with N/V/D. He was recently discharged from [facility] for recurrent UTI. He was finishing a course of abx and felt nauseous each time he took the medication (ceftin). He began having diarrhea as well. He reports over 30 episodes of diarrhea since initiating the oral antibiotic. He has also had episodes of vomiting. This morning he felt extremely week and had a fever of 101. So he decided to come to the ED for evaluation. He denies any other aggravating or alleviating factors.  ? ? ?Subjective: ?5/15 A/O x4, Positive bladder pain.  Positive dysuria, positive CVA tenderness RIGHT.  Positive urine retention ? ? ? ?Assessment & Plan: ?Covid vaccination; ?  ?Principal Problem: ?  Diarrhea ?Active Problems: ?  Chronic tophaceous gout ?  GERD ?  BPH without urinary obstruction ?  Hypertension ?  Liver transplant status ([place]) ?  Nausea and vomiting ?  Stage 3a chronic kidney disease (CKD) ([place]) ?  AKI (acute kidney injury) ([place]) ?  Hyperbilirubinemia ?  Hyperglycemia ?  Obesity (BMI 30-39.9) ? ?Liver transplant ?-Mycophenolate 500 mg every morning ?- Mycophenolate 250 mg every evening ?- Prednisone 10 mg daily ?- Tacrolimus 0.5 mg every morning ?- Tacrolimus 1 mg at bedtime ?-5/13 given patient's dehydration Tacrolimus level pending ? ?Positive C. difficile Diarrhea ?- Complete course of vancomycin 125 mg QID ? ?UTI/RIGHT side CVA pain and bladder pain. ?- Patient was unable to complete his course of antibiotics prescribed at [facility] for UTI. ?- 5/13 urine culture negative ?-5/14 US renal abnormal finding see results below ?-5/15 obtain bladder scan ?- 5/15 Toradol IV 30 mg x 1 ? ?Bilateral renal cysts ?- 5/14 US renal: Bilateral cysts, negative hydronephrosis see results below ?  ?AKI on CKD 3a(baseline Cr~1.2  - 1.4); ?-Avoid nephrotoxins ? ?Lab Results  ?Component Value Date  ? CREATININE 1.16 10/29/2021  ? CREATININE 1.39 (H) 10/28/2021  ? CREATININE 2.49 (H) 10/27/2021  ? CREATININE 1.89 (H) 10/26/2021  ? CREATININE 1.26 (H) 09/14/2021  ?-Patient dehydrated normal saline 165m/hr ?-5/14 trending down. ? ?Enlarged prostate ?-Prominent prostate indenting upon the bladder ?- 5/14 PSA pending ?- CT abdomen and pelvis pending: Prostate significantly enlarged to the point of pressing on bladder, pain.  Evaluate for prostate neoplasm/mass, bladder mass ? ?-BPH ?-5/14 increase Flomax 0.4 mg BID ?  ?Essential HTN ?-5/14 Amlodipine 5 mg daily    ?  ?Hyperbilirubinemia ?-5/14 fractionated bilirubin pending ?  ?Hyperglycemia ?-Resolved ?- 5/12 Hemoglobin A1c= 6.2    ?  ?Gout ?- hold home colchicine ?  ?GERD ?- hold PPI ? ?Obesity (BMI 35.84 kg/m?). ?-Address with PCP ?  ? ? ?  ? ? ?  ?Mobility Assessment (last 72 hours)   ? ? Mobility Assessment   ? ? [place] 10/29/21 [phone]) [id number]/14/23 1957 10/28/21 0810 10/27/21 2146 10/27/21 0922  ? Does patient have an order for bedrest or is patient medically unstable No - Continue assessment No - Continue assessment No - Continue assessment No - Continue assessment No - Continue assessment  ? What is the highest level of mobility based on the progressive mobility assessment? Level 6 (Walks independently in room and hall) - Balance while walking in room without assist - Complete Level 6 (Walks independently in room and hall) - Balance  while walking in room without assist - Complete Level 6 (Walks independently in room and hall) - Balance while walking in room without assist - Complete Level 6 (Walks independently in room and hall) - Balance while walking in room without assist - Complete Level 6 (Walks independently in room and hall) - Balance while walking in room without assist - Complete  ? ? [place] Name 10/26/21 1948 10/26/21 1300  ?  ?  ?  ? Does patient have an order for bedrest or is patient  medically unstable No - Continue assessment No - Continue assessment     ? What is the highest level of mobility based on the progressive mobility assessment? Level 6 (Walks independently in room and hall) - Balance while walking in room without assist - Complete Level 6 (Walks independently in room and hall) - Balance while walking in room without assist - Complete     ? ?  ?  ? ?  ? ? ? ?'@IPAL'$ @ ? ?   ?DVT prophylaxis:  ?Code Status: Full ?Family Communication:  ?Status is: Inpatient ? ? ? ?Dispo: The patient is from: Home ?             Anticipated d/c is to: Home ?             Anticipated d/c date is: >3 days ?             Patient currently Unstable ? ? ? ? ? ?Consultants:  ? ? ?Procedures/Significant Events:  ?5/14 US renal: ?Right Kidney: ?  ?Renal measurements: 12.5 x 6.1 x 7.6 cm. = volume: 305 mL. Increased ?echogenicity is noted with cortical thinning. Renal cystic change is ?noted and stable. ?  ?Left Kidney: ?  ?Renal measurements: 12.9 x 5.4 x 8.3 cm. = volume: 302 mL. Multiple ?cysts are again identified stable in appearance from the prior exam. ?Lower pole cyst demonstrates some mild septation. No hydronephrosis ?is noted. Increased echogenicity and cortical thinning is noted. ?  ?Bladder: ?  ?Appears normal for degree of bladder distention. ? ?Prominent prostate indenting upon the bladder ? ? ?I have personally reviewed and interpreted all radiology studies and my findings are as above. ? ?VENTILATOR SETTINGS: ? ? ? ?Cultures ?5/12 blood left arm NGTD ?5/12 blood left hand NGTD ?5/12 HIV nonreactive ?5/12 positive C. difficile antigen/toxigenic C. difficile by PCR ?5/13 influenza A/B pending ?5/13 SARS coronavirus pending ?5/13 urine pending ? ? ?Antimicrobials: ?Anti-infectives (From admission, onward)  ? ? Start     Ordered Stop  ? 10/26/21 1915  vancomycin (VANCOCIN) capsule 125 mg       ? 10/26/21 1827 11/05/21 1759  ? ?  ?  ? ? ?Devices ?  ? ?LINES / TUBES:  ? ? ? ? ?Continuous Infusions: ? sodium  chloride 100 mL/hr at 10/29/21 9924  ? ? ? ?Objective: ?Vitals:  ? 10/28/21 1317 10/28/21 2142 10/28/21 2216 10/29/21 0536  ?BP: (!) 157/95 (!) 154/83 (!) 158/88 (!) 154/68  ?Pulse: 66 70 68 80  ?Resp: '14 16 18 18  '$ ?Temp: 98.4 ?F (36.9 ?C) 98.3 ?F (36.8 ?C) 97.7 ?F (36.5 ?C) 98.1 ?F (36.7 ?C)  ?TempSrc: Oral Oral Oral Oral  ?SpO2: 95% 99% 95% 94%  ?Weight:      ?Height:      ? ? ?Intake/Output Summary (Last 24 hours) at 10/29/2021 1030 ?Last data filed at 10/29/2021 2683 ?Gross per 24 hour  ?Intake 3335.31 ml  ?Output 3325 ml  ?Net 10.31 ml  ? ? ?  Filed Weights  ? 10/26/21 0830  ?Weight: 116.6 kg  ? ? ?Examination: ? ?General: A/O x4, No acute respiratory distress ?Eyes: negative scleral hemorrhage, negative anisocoria, negative icterus ?ENT: Negative Runny nose, negative gingival bleeding, ?Neck:  Negative scars, masses, torticollis, lymphadenopathy, JVD ?Lungs: Clear to auscultation bilaterally without wheezes or crackles ?Cardiovascular: Regular rate and rhythm without murmur gallop or rub normal S1 and S2 ?Abdomen: OBESE, positive abdominal pain, nondistended, positive soft, bowel sounds, no rebound, no ascites, no appreciable mass,positive CVA tenderness ?Extremities: No significant cyanosis, clubbing, or edema bilateral lower extremities ?Skin: Negative rashes, lesions, ulcers ?Psychiatric:  Negative depression, negative anxiety, negative fatigue, negative mania  ?Central nervous system:  Cranial nerves II through XII intact, tongue/uvula midline, all extremities muscle strength 5/5, sensation intact throughout,  negative dysarthria, negative expressive aphasia, negative receptive aphasia. ? ?.  ? ? ? ?Data Reviewed: Care during the described time interval was provided by me .  I have reviewed this patient's available data, including medical history, events of note, physical examination, and all test results as part of my evaluation. ? ?CBC: ?Recent Labs  ?Lab 10/26/21 ?1740 10/27/21 ?0522 10/28/21 ?0431  10/29/21 ?0424  ?WBC 19.1* 11.9* 10.4 8.9  ?NEUTROABS 12.8*  --  6.3 5.7  ?HGB 16.8 13.1 11.6* 11.1*  ?HCT 50.1 39.8 34.5* 32.5*  ?MCV 98.8 100.5* 99.7 98.8  ?PLT 189 126* 119* 122*  ? ? ?Basic Metabolic Pan

## 2021-10-29 NOTE — TOC Benefit Eligibility Note (Signed)
Patient Advocate Encounter ?  ?Insurance verification completed.   ?  ?The patient is currently admitted and upon discharge could be taking DIFICID 200 MG. ?  ?The current 30 day co-pay is, $10.35.  ? ?The patient is currently admitted and upon discharge could be taking VANCOMYCIN 125 MG. ?  ?The current 30 day co-pay is, $4.15.  ? ?The patient is insured through [employer]. ? ? ?   ?

## 2021-10-29 NOTE — Progress Notes (Signed)
Initial Nutrition Assessment ? ?DOCUMENTATION CODES:  ? ?Obesity unspecified ? ?INTERVENTION:  ?- Liberalize diet from a heart healthy to a regular diet  to provide widest variety of menu options to enhance nutritional adequacy ? ?- Ensure Enlive po BID, each supplement provides 350 kcal and 20 grams of protein. ? ?- MVI with minerals daily ? ?- Check micronutrient labs for possible deficiencies d/t ongoing diarrhea: Vitamin A, Vitamin C, zinc ? ?NUTRITION DIAGNOSIS:  ? ?Increased nutrient needs related to acute illness, diarrhea as evidenced by estimated needs ? ?GOAL:  ? ?Patient will meet greater than or equal to 90% of their needs ? ?MONITOR:  ? ?PO intake, Supplement acceptance, Diet advancement, Labs, Weight trends, I & O's ? ?REASON FOR ASSESSMENT:  ? ?Malnutrition Screening Tool ?  ? ?ASSESSMENT:  ? ?Pt admitted with weakness and >30 episodes of diarrhea. PMH significant for liver transplant, HTN, BPH and GERD. Pt recently d/c from [facility] for recurrent UTI. ? ?Spoke with pt via phone call to room. He states that he has been in and out of the hospital for the past 3 weeks. For the past 2 weeks he has been "sick to his stomach" and unable to eat much. He reports a 7 lb weight loss within this time. He reports that his appetite is beginning to improve from where it has been. He states that he is unable to eat lunch today as he is drinking the contrast for a CT scan later today but he plans to order lunch as he is feeling hungry.  ? ?Meal completions: ?05/14: 100%-breakfast, 100%-lunch, 100%-dinner ? ?Reviewed weight history. Pt's weight appeared to have increased until the beginning of this year and he now is noted to have had a 4% weight loss since 03/22 which is not significant for time frame but is concerning given ongoing diarrhea. Will continue to monitor trends throughout admission.  ? ?Given inadequate PO intake within the last 2-3 weeks and significant diarrhea, pt agrees to lab testing to check for  micronutrient deficiencies.  ? ?Medications: maalox/mylanta, prednisone, vancomycin ? ?Labs: alkaline phosphatase 32, AST 13, HgbA1c 6.2% ? ?NUTRITION - FOCUSED PHYSICAL EXAM: ?RD working remotely. Deferred to follow up.  ? ?Diet Order:   ?Diet Order   ? ?       ?  Diet regular Room service appropriate? Yes; Fluid consistency: Thin  Diet effective now       ?  ? ?  ?  ? ?  ? ? ?EDUCATION NEEDS:  ? ?Education needs have been addressed ? ?Skin:  Skin Assessment: Reviewed RN Assessment ? ?Last BM:  5/14 ? ?Height:  ? ?Ht Readings from Last 1 Encounters:  ?10/26/21 '5\' 11"'$  (1.803 m)  ? ? ?Weight:  ? ?Wt Readings from Last 1 Encounters:  ?10/26/21 116.6 kg  ? ? ?Ideal Body Weight:  78.2 kg ? ?BMI:  Body mass index is 35.84 kg/m?. ? ?Estimated Nutritional Needs:  ? ?Kcal:  2100-2300 ? ?Protein:  105-120g ? ?Fluid:  >/=2.1L ? ?Clayborne Dana, RDN, LDN ?Clinical Nutrition ?

## 2021-10-30 DIAGNOSIS — N179 Acute kidney failure, unspecified: Secondary | ICD-10-CM | POA: Diagnosis not present

## 2021-10-30 DIAGNOSIS — M1A9XX1 Chronic gout, unspecified, with tophus (tophi): Secondary | ICD-10-CM | POA: Diagnosis not present

## 2021-10-30 DIAGNOSIS — R197 Diarrhea, unspecified: Secondary | ICD-10-CM | POA: Diagnosis not present

## 2021-10-30 DIAGNOSIS — M10071 Idiopathic gout, right ankle and foot: Secondary | ICD-10-CM

## 2021-10-30 DIAGNOSIS — N400 Enlarged prostate without lower urinary tract symptoms: Secondary | ICD-10-CM | POA: Diagnosis not present

## 2021-10-30 LAB — COMPREHENSIVE METABOLIC PANEL
ALT: 34 U/L (ref 0–44)
AST: 18 U/L (ref 15–41)
Albumin: 3.2 g/dL — ABNORMAL LOW (ref 3.5–5.0)
Alkaline Phosphatase: 34 U/L — ABNORMAL LOW (ref 38–126)
Anion gap: 5 (ref 5–15)
BUN: 13 mg/dL (ref 8–23)
CO2: 26 mmol/L (ref 22–32)
Calcium: 8.5 mg/dL — ABNORMAL LOW (ref 8.9–10.3)
Chloride: 108 mmol/L (ref 98–111)
Creatinine, Ser: 1.14 mg/dL (ref 0.61–1.24)
GFR, Estimated: >60 mL/min (ref >60)
Glucose, Bld: 119 mg/dL — ABNORMAL HIGH (ref 70–99)
Potassium: 4.6 mmol/L (ref 3.5–5.1)
Sodium: 139 mmol/L (ref 135–145)
Total Bilirubin: 1.3 mg/dL — ABNORMAL HIGH (ref 0.3–1.2)
Total Protein: 5.9 g/dL — ABNORMAL LOW (ref 6.5–8.1)

## 2021-10-30 LAB — CBC WITH DIFFERENTIAL/PLATELET
Abs Immature Granulocytes: 0.15 10*3/uL — ABNORMAL HIGH (ref 0.00–0.07)
Basophils Absolute: 0 10*3/uL (ref 0.0–0.1)
Basophils Relative: 0 %
Eosinophils Absolute: 0.2 10*3/uL (ref 0.0–0.5)
Eosinophils Relative: 3 %
HCT: 33 % — ABNORMAL LOW (ref 39.0–52.0)
Hemoglobin: 11.1 g/dL — ABNORMAL LOW (ref 13.0–17.0)
Immature Granulocytes: 2 %
Lymphocytes Relative: 19 %
Lymphs Abs: 1.4 10*3/uL (ref 0.7–4.0)
MCH: 32.9 pg (ref 26.0–34.0)
MCHC: 33.6 g/dL (ref 30.0–36.0)
MCV: 97.9 fL (ref 80.0–100.0)
Monocytes Absolute: 0.8 10*3/uL (ref 0.1–1.0)
Monocytes Relative: 10 %
Neutro Abs: 5 10*3/uL (ref 1.7–7.7)
Neutrophils Relative %: 66 %
Platelets: 131 10*3/uL — ABNORMAL LOW (ref 150–400)
RBC: 3.37 MIL/uL — ABNORMAL LOW (ref 4.22–5.81)
RDW: 13.7 % (ref 11.5–15.5)
WBC: 7.6 10*3/uL (ref 4.0–10.5)
nRBC: 0 % (ref 0.0–0.2)

## 2021-10-30 LAB — MAGNESIUM: Magnesium: 2.4 mg/dL (ref 1.7–2.4)

## 2021-10-30 LAB — PHOSPHORUS: Phosphorus: 3.2 mg/dL (ref 2.5–4.6)

## 2021-10-30 LAB — TACROLIMUS LEVEL: Tacrolimus (FK506) - LabCorp: 4.4 ng/mL (ref 2.0–20.0)

## 2021-10-30 MED ORDER — PREDNISONE 20 MG PO TABS
60.0000 mg | ORAL_TABLET | Freq: Every day | ORAL | Status: DC
Start: 2021-10-31 — End: 2021-10-31
  Administered 2021-10-31: 60 mg via ORAL
  Filled 2021-10-30: qty 3

## 2021-10-30 MED ORDER — ZINC SULFATE 220 (50 ZN) MG PO CAPS
220.0000 mg | ORAL_CAPSULE | Freq: Every day | ORAL | Status: DC
  Administered 2021-10-30 – 2021-11-07 (×9): 220 mg via ORAL
  Filled 2021-10-30 (×10): qty 1

## 2021-10-30 MED ORDER — ACETAMINOPHEN 325 MG PO TABS
325.0000 mg | ORAL_TABLET | Freq: Once | ORAL | Status: AC
  Administered 2021-10-30: 325 mg via ORAL
  Filled 2021-10-30: qty 1

## 2021-10-30 MED ORDER — COLCHICINE 0.6 MG PO TABS
0.6000 mg | ORAL_TABLET | Freq: Every day | ORAL | Status: DC
Start: 2021-10-30 — End: 2021-11-04
  Administered 2021-10-30 – 2021-11-04 (×6): 0.6 mg via ORAL
  Filled 2021-10-30 (×6): qty 1

## 2021-10-30 MED ORDER — OXYCODONE HCL 5 MG PO TABS
5.0000 mg | ORAL_TABLET | Freq: Four times a day (QID) | ORAL | Status: DC | PRN
  Administered 2021-10-30 – 2021-11-06 (×10): 5 mg via ORAL
  Filled 2021-10-30 (×13): qty 1

## 2021-10-30 MED ORDER — ASCORBIC ACID 500 MG PO TABS
500.0000 mg | ORAL_TABLET | Freq: Every day | ORAL | Status: DC
Start: 2021-10-30 — End: 2021-11-07
  Administered 2021-10-30 – 2021-11-07 (×9): 500 mg via ORAL
  Filled 2021-10-30 (×9): qty 1

## 2021-10-30 MED ORDER — VITAMIN A 3 MG (10000 UNIT) PO CAPS
10000.0000 [IU] | ORAL_CAPSULE | Freq: Every day | ORAL | Status: DC
  Administered 2021-10-30 – 2021-11-07 (×9): 10000 [IU] via ORAL
  Filled 2021-10-30 (×10): qty 1

## 2021-10-30 NOTE — Progress Notes (Signed)
?PROGRESS NOTE ? ? ? ?Devin White  ZOX:[id number] DOB: 05/20/1956 DOA: 10/26/2021 ?PCP: Venia Carbon, MD  ? ? ? ?Brief Narrative:  ? 66 y.o. WM PMHx liver transplant, HTN, BPH, GERD.  ? ?Presenting with N/V/D. He was recently discharged from [facility] for recurrent UTI. He was finishing a course of abx and felt nauseous each time he took the medication (ceftin). He began having diarrhea as well. He reports over 30 episodes of diarrhea since initiating the oral antibiotic. He has also had episodes of vomiting. This morning he felt extremely week and had a fever of 101. So he decided to come to the ED for evaluation. He denies any other aggravating or alleviating factors.  ? ? ?Subjective: ?5/16 afebrile overnight A/O x4.  Positive bladder pain, positive right CVA tenderness.  Positive new onset right knee tenderness and left ankle tenderness consistent with acute gout flare. ? ? ? ? ? ?Assessment & Plan: ?Covid vaccination; ?  ?Principal Problem: ?  Diarrhea ?Active Problems: ?  Chronic tophaceous gout ?  GERD ?  BPH without urinary obstruction ?  Hypertension ?  Liver transplant status ([place]) ?  Nausea and vomiting ?  Stage 3a chronic kidney disease (CKD) ([place]) ?  AKI (acute kidney injury) ([place]) ?  Hyperbilirubinemia ?  Hyperglycemia ?  Obesity (BMI 30-39.9) ? ?Liver transplant ?-Mycophenolate 500 mg every morning ?- Mycophenolate 250 mg every evening ?- Prednisone 10 mg daily ?- Tacrolimus 0.5 mg every morning ?- Tacrolimus 1 mg at bedtime ?-5/13 given patient's dehydration Tacrolimus level= 4.4 ? ?Positive C. difficile Diarrhea ?- Complete course of vancomycin 125 mg QID ?-5/16 vitamin A 10,000 units daily ?- 5/16 vitamin C 500 mg daily ?- 5/16 zinc 220 mg daily ?-5/16 pharmacy has already priced out vancomycin for discharge.  See pharmacy tech note 5/15 ? ?UTI/RIGHT side CVA pain and bladder pain. ?- Patient was unable to complete his course of antibiotics prescribed at [facility] for UTI. ?- 5/13  urine culture negative ?-5/14 US renal abnormal finding see results below ?-5/15 obtain bladder scan ?- 5/15 Toradol IV 30 mg x 1 ? ?Bilateral renal cysts ?- 5/14 US renal: Bilateral cysts, negative hydronephrosis see results below ?  ?AKI on CKD 3a(baseline Cr~1.2 - 1.4); ?-Avoid nephrotoxins ? ?Lab Results  ?Component Value Date  ? CREATININE 1.14 10/30/2021  ? CREATININE 1.16 10/29/2021  ? CREATININE 1.39 (H) 10/28/2021  ? CREATININE 2.49 (H) 10/27/2021  ? CREATININE 1.89 (H) 10/26/2021  ?-Patient dehydrated normal saline 166m/hr ?-5/14 trending down. ?-5/16 resolved ? ?Enlarged prostate ?-Prominent prostate indenting upon the bladder ?- 5/14 PSA pending ?- CT abdomen and pelvis pending: Prostate significantly enlarged to the point of pressing on bladder, pain.  Evaluate for prostate neoplasm/mass, bladder mass ?-5/16 CT abdomen and pelvis: Negative masses see results below ? ?-BPH ?-5/14 increase Flomax 0.4 mg BID ?  ?Essential HTN ?-5/14 Amlodipine 5 mg daily    ?  ?Hyperbilirubinemia ?-5/14 fractionated bilirubin pending ?  ?Hyperglycemia ?-Resolved ?- 5/12 Hemoglobin A1c= 6.2    ?  ?Gout ?-5/16 Colchicine 0.6 mg daily ?-5/16 increase Prednisone 60 mg daily, from 10 mg daily ?- 5/16 uric acid pending ?  ?GERD ?- hold PPI ? ?Obesity (BMI 35.84 kg/m?). ?-Address with PCP ?  ? ? ?  ? ? ?  ?Mobility Assessment (last 72 hours)   ? ? Mobility Assessment   ? ? Row Name 10/30/21 0900 10/29/21 0906 10/28/21 1957 10/28/21 0810 10/27/21 2146  ?  Does patient have an order for bedrest or is patient medically unstable No - Continue assessment No - Continue assessment No - Continue assessment No - Continue assessment No - Continue assessment  ? What is the highest level of mobility based on the progressive mobility assessment? Level 6 (Walks independently in room and hall) - Balance while walking in room without assist - Complete Level 6 (Walks independently in room and hall) - Balance while walking in room without assist -  Complete Level 6 (Walks independently in room and hall) - Balance while walking in room without assist - Complete Level 6 (Walks independently in room and hall) - Balance while walking in room without assist - Complete Level 6 (Walks independently in room and hall) - Balance while walking in room without assist - Complete  ? ?  ?  ? ?  ? ? ? ?'@IPAL'$ @ ? ?   ?DVT prophylaxis:  ?Code Status: Full ?Family Communication:  ?Status is: Inpatient ? ? ? ?Dispo: The patient is from: Home ?             Anticipated d/c is to: Home ?             Anticipated d/c date is: >3 days ?             Patient currently Unstable ? ? ? ? ? ?Consultants:  ? ? ?Procedures/Significant Events:  ?5/14 US renal: ?Right Kidney: ?  ?Renal measurements: 12.5 x 6.1 x 7.6 cm. = volume: 305 mL. Increased ?echogenicity is noted with cortical thinning. Renal cystic change is ?noted and stable. ?  ?Left Kidney: ?  ?Renal measurements: 12.9 x 5.4 x 8.3 cm. = volume: 302 mL. Multiple ?cysts are again identified stable in appearance from the prior exam. ?Lower pole cyst demonstrates some mild septation. No hydronephrosis ?is noted. Increased echogenicity and cortical thinning is noted. ?  ?Bladder: ?  ?Appears normal for degree of bladder distention. ? ?Prominent prostate indenting upon the bladder ?5/15 CT Abdomen pelvis Wo contrast ?Signs of basilar atelectasis but with ground-glass and ?consolidative changes at the RIGHT lung base raising the question of ?pneumonia or pulmonary infarct. Would correlate with patient's ?symptoms and consider lower extremity Doppler evaluation or chest CT ?for further assessment to evaluate for source or direct signs of ?pulmonary embolism as warranted. ?2. Post liver transplant. ?3. Signs of colonic diverticulosis. No signs of bowel obstruction ?with formed stool in the colon currently without signs of ?perienteric or pericolonic stranding but with small amount of ?ascites in the LEFT hemiabdomen presumably related to  prior ?inflammation. ?4. Aortic atherosclerosis. ? ? ?I have personally reviewed and interpreted all radiology studies and my findings are as above. ? ?VENTILATOR SETTINGS: ? ? ? ?Cultures ?5/12 blood left arm NGTD ?5/12 blood left hand NGTD ?5/12 HIV nonreactive ?5/12 positive C. difficile antigen/toxigenic C. difficile by PCR ?5/13 influenza A/B negative ?5/13 SARS coronavirus negative ?5/13 urine negative final ? ? ?Antimicrobials: ?Anti-infectives (From admission, onward)  ? ? Start     Ordered Stop  ? 10/26/21 1915  vancomycin (VANCOCIN) capsule 125 mg       ? 10/26/21 1827 11/05/21 1759  ? ?  ?  ? ? ?Devices ?  ? ?LINES / TUBES:  ? ? ? ? ?Continuous Infusions: ? sodium chloride 100 mL/hr at 10/30/21 0148  ? ? ? ?Objective: ?Vitals:  ? 10/29/21 0536 10/29/21 1419 10/29/21 2100 10/30/21 0512  ?BP: (!) 154/68 (!) 165/86 (!) 161/94 (!) 149/88  ?Pulse:  80 72 79 70  ?Resp: '18 18 18 18  '$ ?Temp: 98.1 ?F (36.7 ?C) 98.5 ?F (36.9 ?C) 98.4 ?F (36.9 ?C) 98.4 ?F (36.9 ?C)  ?TempSrc: Oral Oral Oral Oral  ?SpO2: 94% 93% 97% 94%  ?Weight:      ?Height:      ? ? ?Intake/Output Summary (Last 24 hours) at 10/30/2021 0932 ?Last data filed at 10/30/2021 0600 ?Gross per 24 hour  ?Intake 1002.93 ml  ?Output 3400 ml  ?Net -2397.07 ml  ? ? ?Filed Weights  ? 10/26/21 0830  ?Weight: 116.6 kg  ? ? ?Examination: ? ?General: A/O x4, No acute respiratory distress ?Eyes: negative scleral hemorrhage, negative anisocoria, negative icterus ?ENT: Negative Runny nose, negative gingival bleeding, ?Neck:  Negative scars, masses, torticollis, lymphadenopathy, JVD ?Lungs: Clear to auscultation bilaterally without wheezes or crackles ?Cardiovascular: Regular rate and rhythm without murmur gallop or rub normal S1 and S2 ?Abdomen: OBESE, positive abdominal pain, nondistended, positive soft, bowel sounds, no rebound, no ascites, no appreciable mass,positive CVA tenderness ?Extremities: positive pain and swelling of the medial aspect LEFT ankle warm to the  touch.positive pain/warm to the touch RIGHT medial knee ?Skin: Negative rashes, lesions, ulcers ?Psychiatric:  Negative depression, negative anxiety, negative fatigue, negative mania  ?Central nervous system:

## 2021-10-31 DIAGNOSIS — M1A9XX1 Chronic gout, unspecified, with tophus (tophi): Secondary | ICD-10-CM | POA: Diagnosis not present

## 2021-10-31 DIAGNOSIS — R197 Diarrhea, unspecified: Secondary | ICD-10-CM | POA: Diagnosis not present

## 2021-10-31 DIAGNOSIS — N400 Enlarged prostate without lower urinary tract symptoms: Secondary | ICD-10-CM | POA: Diagnosis not present

## 2021-10-31 LAB — COMPREHENSIVE METABOLIC PANEL
ALT: 33 U/L (ref 0–44)
AST: 14 U/L — ABNORMAL LOW (ref 15–41)
Albumin: 3.1 g/dL — ABNORMAL LOW (ref 3.5–5.0)
Alkaline Phosphatase: 34 U/L — ABNORMAL LOW (ref 38–126)
Anion gap: 7 (ref 5–15)
BUN: 13 mg/dL (ref 8–23)
CO2: 23 mmol/L (ref 22–32)
Calcium: 8.8 mg/dL — ABNORMAL LOW (ref 8.9–10.3)
Chloride: 110 mmol/L (ref 98–111)
Creatinine, Ser: 1.28 mg/dL — ABNORMAL HIGH (ref 0.61–1.24)
GFR, Estimated: >60 mL/min (ref >60)
Glucose, Bld: 141 mg/dL — ABNORMAL HIGH (ref 70–99)
Potassium: 4.2 mmol/L (ref 3.5–5.1)
Sodium: 140 mmol/L (ref 135–145)
Total Bilirubin: 0.8 mg/dL (ref 0.3–1.2)
Total Protein: 5.7 g/dL — ABNORMAL LOW (ref 6.5–8.1)

## 2021-10-31 LAB — CBC WITH DIFFERENTIAL/PLATELET
Abs Immature Granulocytes: 0.14 10*3/uL — ABNORMAL HIGH (ref 0.00–0.07)
Basophils Absolute: 0 10*3/uL (ref 0.0–0.1)
Basophils Relative: 0 %
Eosinophils Absolute: 0.1 10*3/uL (ref 0.0–0.5)
Eosinophils Relative: 1 %
HCT: 33.3 % — ABNORMAL LOW (ref 39.0–52.0)
Hemoglobin: 11 g/dL — ABNORMAL LOW (ref 13.0–17.0)
Immature Granulocytes: 2 %
Lymphocytes Relative: 15 %
Lymphs Abs: 1.4 10*3/uL (ref 0.7–4.0)
MCH: 32.6 pg (ref 26.0–34.0)
MCHC: 33 g/dL (ref 30.0–36.0)
MCV: 98.8 fL (ref 80.0–100.0)
Monocytes Absolute: 0.9 10*3/uL (ref 0.1–1.0)
Monocytes Relative: 10 %
Neutro Abs: 6.8 10*3/uL (ref 1.7–7.7)
Neutrophils Relative %: 72 %
Platelets: 137 10*3/uL — ABNORMAL LOW (ref 150–400)
RBC: 3.37 MIL/uL — ABNORMAL LOW (ref 4.22–5.81)
RDW: 13.7 % (ref 11.5–15.5)
WBC: 9.3 10*3/uL (ref 4.0–10.5)
nRBC: 0 % (ref 0.0–0.2)

## 2021-10-31 LAB — URIC ACID: Uric Acid, Serum: <1.5 mg/dL — ABNORMAL LOW (ref 3.7–8.6)

## 2021-10-31 LAB — MAGNESIUM: Magnesium: 2.2 mg/dL (ref 1.7–2.4)

## 2021-10-31 LAB — ZINC: Zinc: 50 ug/dL (ref 44–115)

## 2021-10-31 LAB — CULTURE, BLOOD (ROUTINE X 2)
Culture: NO GROWTH
Culture: NO GROWTH
Special Requests: ADEQUATE

## 2021-10-31 LAB — PHOSPHORUS: Phosphorus: 2.5 mg/dL (ref 2.5–4.6)

## 2021-10-31 MED ORDER — METHYLPREDNISOLONE SODIUM SUCC 40 MG IJ SOLR
40.0000 mg | Freq: Every day | INTRAMUSCULAR | Status: DC
Start: 2021-10-31 — End: 2021-11-01
  Administered 2021-10-31 – 2021-11-01 (×2): 40 mg via INTRAVENOUS
  Filled 2021-10-31 (×2): qty 1

## 2021-10-31 NOTE — Care Management Important Message (Signed)
Important Message ? ?Patient Details IM Letter given to the Patient. ?Name: Devin White ?MRN: [id number] ?Date of Birth: July 01, 1955 ? ? ?Medicare Important Message Given:  Yes ? ? ? ? ?Kerin Salen ?10/31/2021, 10:05 AM ?

## 2021-10-31 NOTE — Hospital Course (Signed)
66yo with hx liver transplant, HTN, BPH, GERD presenting with N/V/D. He was recently discharged from [facility] for recurrent UTI. He was finishing a course of abx and felt nauseous each time he took the medication (ceftin). He began having diarrhea as well. He reports over 30 episodes of diarrhea since initiating the oral antibiotic. He has also had episodes of vomiting. This morning he felt extremely week and had a fever of 101. So he decided to come to the ED for evaluation. He denies any other aggravating or alleviating factors.  ?

## 2021-10-31 NOTE — Progress Notes (Signed)
?Progress Note ? ? ?Patient: Devin White DQQ:[id number] DOB: Sep 13, 1955 DOA: 10/26/2021     4 ?DOS: the patient was seen and examined on 10/31/2021 ?  ?Brief hospital course: ?66yo with hx liver transplant, HTN, BPH, GERD presenting with N/V/D. He was recently discharged from [facility] for recurrent UTI. He was finishing a course of abx and felt nauseous each time he took the medication (ceftin). He began having diarrhea as well. He reports over 30 episodes of diarrhea since initiating the oral antibiotic. He has also had episodes of vomiting. This morning he felt extremely week and had a fever of 101. So he decided to come to the ED for evaluation. He denies any other aggravating or alleviating factors.  ? ?Assessment and Plan: ?No notes have been filed under this hospital service. ?Service: Hospitalist ?Liver transplant ?-Mycophenolate 500 mg every morning ?- Mycophenolate 250 mg every evening ?- Tacrolimus 0.5 mg every morning ?- Tacrolimus 1 mg at bedtime ?-5/13 given patient's dehydration Tacrolimus level= 4.4 ?- On prednisone '10mg'$  daily PTA, increased to solumedrol secondary to acute gout, per below ?  ?Positive C. difficile Diarrhea ?- Complete course of vancomycin 125 mg QID ?-5/16 pharmacy has already priced out vancomycin for discharge.  See pharmacy tech note 5/15 ?-diarrhea improving ?  ?UTI/RIGHT side CVA pain and bladder pain. ?- Patient was unable to complete his course of antibiotics prescribed at [facility] for UTI. ?- 5/13 urine culture negative ?  ?Bilateral renal cysts ?- 5/14 US renal: Bilateral cysts, negative hydronephrosis see results below ?  ?AKI on CKD 3a(baseline Cr~1.2 - 1.4); ?-Avoid nephrotoxins ?-Cr 1.28 today ?  ?Enlarged prostate ?-Prominent prostate indenting upon the bladder ?- PSA 0.45 ?-CT abdomen and pelvis: Negative masses see results below ?  ?-BPH ?-5/14 increased Flomax 0.4 mg BID ?  ?Essential HTN ?-5/14 Amlodipine 5 mg daily    ?  ?Hyperbilirubinemia ?-5/14  fractionated bilirubin pending ?  ?Hyperglycemia ?-Resolved ?- 5/12 Hemoglobin A1c= 6.2    ?  ?Gout ?-5/16 Colchicine 0.6 mg daily ?-5/16 increased Prednisone 60 mg daily with continued worsening joint pain and swelling ?-will transition to trial of solumedrol ?- 5/16 uric acid low at <1.5 ?  ?GERD ?- hold PPI given cdiff ?  ?Obesity (BMI 35.84 kg/m?). ?-Address with PCP ?  ? ? ?  ? ?Subjective: Complains of worsening multiple joint pain and swelling ? ?Physical Exam: ?Vitals:  ? 10/30/21 1238 10/30/21 2000 10/31/21 0415 10/31/21 1237  ?BP: (!) 161/96 (!) 175/100 (!) 158/91 (!) 157/82  ?Pulse: 77 78 81 80  ?Resp: '18 17 17 16  '$ ?Temp: 98.4 ?F (36.9 ?C) 98.2 ?F (36.8 ?C) 98.2 ?F (36.8 ?C) 98 ?F (36.7 ?C)  ?TempSrc:  Oral Oral   ?SpO2: 97% 95% 94% 95%  ?Weight:      ?Height:      ? ?General exam: Awake, laying in bed, in nad ?Respiratory system: Normal respiratory effort, no wheezing ?Cardiovascular system: regular rate, s1, s2 ?Gastrointestinal system: Soft, nondistended, positive BS ?Central nervous system: CN2-12 grossly intact, strength intact ?Extremities: Perfused, no clubbing, multiple joint tenderness, swelling, erythema ?Skin: Normal skin turgor, no notable skin lesions seen ?Psychiatry: Mood normal // no visual hallucinations  ? ?Data Reviewed: ? ?Labs reviewed: Cr 1.28, Uric acid <1.5, Hgb 11.0 ? ?Family Communication: Pt in room, family not at bedside ? ?Disposition: ?Status is: Inpatient ?Remains inpatient appropriate because: Severity of illness ? Planned Discharge Destination: Home ? ? ? ? ?Author: ?Marylu Lund, MD ?10/31/2021  3:43 PM ? ?For on call review [email or web address].  ?

## 2021-10-31 NOTE — Evaluation (Signed)
Physical Therapy Evaluation ?Patient Details ?Name: Devin White ?MRN: [id number] ?DOB: 01-Nov-1955 ?Today's Date: 10/31/2021 ? ?History of Present Illness ? 66 yo male admitted with C diff, gout flare. Hx of liver transplant, gout, BPH  ?Clinical Impression ? On eval, pt was MIn guard assist for mobility. He was able to step over to recliner but not without significant pain and difficulty. Steps were very antalgic. Ambulation deferred on today 2* pain. Pt is hopeful pain will subside more by tomorrow. Possible plan for d/c home on tomorrow but this will be dependent on pain control. Do not anticipate any f/u PT needs at this time-will update recs if necessary.    ?   ? ?Recommendations for follow up therapy are one component of a multi-disciplinary discharge planning process, led by the attending physician.  Recommendations may be updated based on patient status, additional functional criteria and insurance authorization. ? ?Follow Up Recommendations No PT follow up ? ?  ?Assistance Recommended at Discharge PRN  ?Patient can return home with the following ? Assist for transportation;Assistance with cooking/housework;Help with stairs or ramp for entrance;A little help with walking and/or transfers ? ?  ?Equipment Recommendations None recommended by PT  ?Recommendations for Other Services ?    ?  ?Functional Status Assessment Patient has had a recent decline in their functional status and demonstrates the ability to make significant improvements in function in a reasonable and predictable amount of time.  ? ?  ?Precautions / Restrictions Precautions ?Precautions: Fall ?Restrictions ?Weight Bearing Restrictions: No  ? ?  ? ?Mobility ? Bed Mobility ?Overal bed mobility: Modified Independent ?  ?  ?  ?  ?  ?  ?  ?  ? ?Transfers ?Overall transfer level: Needs assistance ?  ?Transfers: Sit to/from Stand, Bed to chair/wheelchair/BSC ?Sit to Stand: Min guard ?  ?Step pivot transfers: Min guard ?  ?  ?  ?General transfer  comment: Min guard for safety. Unsteady. Antalgic. Pt only wanted to get over to recliner on today ?  ? ?Ambulation/Gait ?  ?  ?  ?  ?  ?  ?  ?General Gait Details: Deferred on today 2* pain ? ?Stairs ?  ?  ?  ?  ?  ? ?Wheelchair Mobility ?  ? ?Modified Rankin (Stroke Patients Only) ?  ? ?  ? ?Balance Overall balance assessment: Needs assistance ?  ?  ?  ?  ?  ?Standing balance-Leahy Scale: Fair ?  ?  ?  ?  ?  ?  ?  ?  ?  ?  ?  ?  ?   ? ? ? ?Pertinent Vitals/Pain Pain Assessment ?Pain Assessment: Faces ?Faces Pain Scale: Hurts whole lot ?Pain Location: bil knee, ankles, feet ?Pain Descriptors / Indicators: Hervey Ard, Aching, Guarding ?Pain Intervention(s): Limited activity within patient's tolerance, Monitored during session, Repositioned  ? ? ?Home Living Family/patient expects to be discharged to:: Private residence ?Living Arrangements: Children ?Available Help at Discharge: Family ?Type of Home: House ?Home Access: Stairs to enter ?  ?Entrance Stairs-Number of Steps: 1 ?  ?Home Layout: Able to live on main level with bedroom/bathroom ?Home Equipment: Conservation officer, nature (2 wheels) ?   ?  ?Prior Function Prior Level of Function : Independent/Modified Independent ?  ?  ?  ?  ?  ?  ?Mobility Comments: still driving. doesn't normally use a device ?  ?  ? ? ?Hand Dominance  ?   ? ?  ?Extremity/Trunk Assessment  ? Upper Extremity Assessment ?  Upper Extremity Assessment: Overall WFL for tasks assessed ?  ? ?Lower Extremity Assessment ?Lower Extremity Assessment: Generalized weakness ?  ? ?Cervical / Trunk Assessment ?Cervical / Trunk Assessment: Normal  ?Communication  ? Communication: No difficulties  ?Cognition Arousal/Alertness: Awake/alert ?Behavior During Therapy: [facility] for tasks assessed/performed ?Overall Cognitive Status: Within Functional Limits for tasks assessed ?  ?  ?  ?  ?  ?  ?  ?  ?  ?  ?  ?  ?  ?  ?  ?  ?  ?  ?  ? ?  ?General Comments   ? ?  ?Exercises    ? ?Assessment/Plan  ?  ?PT Assessment Patient needs  continued PT services  ?PT Problem List Decreased mobility;Decreased activity tolerance;Decreased balance;Decreased knowledge of use of DME;Pain ? ?   ?  ?PT Treatment Interventions DME instruction;Gait training;Therapeutic activities;Therapeutic exercise;Patient/family education;Functional mobility training;Balance training   ? ?PT Goals (Current goals can be found in the Care Plan section)  ?Acute Rehab PT Goals ?Patient Stated Goal: less pain. home soon ?PT Goal Formulation: With patient ?Time For Goal Achievement: 11/14/21 ?Potential to Achieve Goals: Good ? ?  ?Frequency Min 3X/week ?  ? ? ?Co-evaluation   ?  ?  ?  ?  ? ? ?  ?AM-PAC PT "6 Clicks" Mobility  ?Outcome Measure Help needed turning from your back to your side while in a flat bed without using bedrails?: A Little ?Help needed moving from lying on your back to sitting on the side of a flat bed without using bedrails?: A Little ?Help needed moving to and from a bed to a chair (including a wheelchair)?: A Little ?Help needed standing up from a chair using your arms (e.g., wheelchair or bedside chair)?: A Little ?Help needed to walk in hospital room?: A Little ?Help needed climbing 3-5 steps with a railing? : Total ?6 Click Score: 16 ? ?  ?End of Session   ?Activity Tolerance: Patient limited by pain ?Patient left: in chair;with call bell/phone within reach ?  ?PT Visit Diagnosis: Pain;Unsteadiness on feet (R26.81);Difficulty in walking, not elsewhere classified (R26.2) ?Pain - Right/Left:  (bil) ?Pain - part of body: Leg;Ankle and joints of foot;Knee ?  ? ?Time: 3664-4034 ?PT Time Calculation (min) (ACUTE ONLY): 27 min ? ? ?Charges:   PT Evaluation ?$PT Eval Moderate Complexity: 1 Mod ?  ?  ?   ? ? ? ? ? ?Baylei Siebels P, PT ?Acute Rehabilitation  ?Office: [phone] ?Pager: [phone] ?   ? ?

## 2021-11-01 DIAGNOSIS — M1A9XX1 Chronic gout, unspecified, with tophus (tophi): Secondary | ICD-10-CM | POA: Diagnosis not present

## 2021-11-01 DIAGNOSIS — N179 Acute kidney failure, unspecified: Secondary | ICD-10-CM | POA: Diagnosis not present

## 2021-11-01 LAB — CBC WITH DIFFERENTIAL/PLATELET
Abs Immature Granulocytes: 0.33 10*3/uL — ABNORMAL HIGH (ref 0.00–0.07)
Basophils Absolute: 0 10*3/uL (ref 0.0–0.1)
Basophils Relative: 0 %
Eosinophils Absolute: 0 10*3/uL (ref 0.0–0.5)
Eosinophils Relative: 0 %
HCT: 33.6 % — ABNORMAL LOW (ref 39.0–52.0)
Hemoglobin: 11.5 g/dL — ABNORMAL LOW (ref 13.0–17.0)
Immature Granulocytes: 3 %
Lymphocytes Relative: 8 %
Lymphs Abs: 1 10*3/uL (ref 0.7–4.0)
MCH: 32.9 pg (ref 26.0–34.0)
MCHC: 34.2 g/dL (ref 30.0–36.0)
MCV: 96 fL (ref 80.0–100.0)
Monocytes Absolute: 0.5 10*3/uL (ref 0.1–1.0)
Monocytes Relative: 4 %
Neutro Abs: 10.6 10*3/uL — ABNORMAL HIGH (ref 1.7–7.7)
Neutrophils Relative %: 85 %
Platelets: 165 10*3/uL (ref 150–400)
RBC: 3.5 MIL/uL — ABNORMAL LOW (ref 4.22–5.81)
RDW: 13.3 % (ref 11.5–15.5)
WBC: 12.4 10*3/uL — ABNORMAL HIGH (ref 4.0–10.5)
nRBC: 0 % (ref 0.0–0.2)

## 2021-11-01 LAB — COMPREHENSIVE METABOLIC PANEL
ALT: 32 U/L (ref 0–44)
AST: 15 U/L (ref 15–41)
Albumin: 3.3 g/dL — ABNORMAL LOW (ref 3.5–5.0)
Alkaline Phosphatase: 41 U/L (ref 38–126)
Anion gap: 8 (ref 5–15)
BUN: 20 mg/dL (ref 8–23)
CO2: 26 mmol/L (ref 22–32)
Calcium: 9.4 mg/dL (ref 8.9–10.3)
Chloride: 107 mmol/L (ref 98–111)
Creatinine, Ser: 1.4 mg/dL — ABNORMAL HIGH (ref 0.61–1.24)
GFR, Estimated: 56 mL/min — ABNORMAL LOW (ref >60)
Glucose, Bld: 197 mg/dL — ABNORMAL HIGH (ref 70–99)
Potassium: 4.9 mmol/L (ref 3.5–5.1)
Sodium: 141 mmol/L (ref 135–145)
Total Bilirubin: 0.7 mg/dL (ref 0.3–1.2)
Total Protein: 6.4 g/dL — ABNORMAL LOW (ref 6.5–8.1)

## 2021-11-01 LAB — MAGNESIUM: Magnesium: 2.3 mg/dL (ref 1.7–2.4)

## 2021-11-01 LAB — PHOSPHORUS: Phosphorus: 1.7 mg/dL — ABNORMAL LOW (ref 2.5–4.6)

## 2021-11-01 LAB — VITAMIN A: Vitamin A (Retinoic Acid): 24.9 ug/dL (ref 22.0–69.5)

## 2021-11-01 MED ORDER — HYDRALAZINE HCL 20 MG/ML IJ SOLN
10.0000 mg | Freq: Once | INTRAMUSCULAR | Status: AC
  Administered 2021-11-01: 10 mg via INTRAVENOUS
  Filled 2021-11-01: qty 1

## 2021-11-01 MED ORDER — PREDNISONE 20 MG PO TABS
40.0000 mg | ORAL_TABLET | Freq: Every day | ORAL | Status: DC
  Administered 2021-11-02 – 2021-11-07 (×6): 40 mg via ORAL
  Filled 2021-11-01 (×6): qty 2

## 2021-11-01 MED ORDER — SODIUM PHOSPHATES 45 MMOLE/15ML IV SOLN
30.0000 mmol | Freq: Once | INTRAVENOUS | Status: AC
  Administered 2021-11-01: 30 mmol via INTRAVENOUS
  Filled 2021-11-01: qty 10

## 2021-11-01 NOTE — Evaluation (Signed)
Occupational Therapy Evaluation Patient Details Name: Devin White MRN: [id number] DOB: [birth year] Today's Date: 11/01/2021   History of Present Illness 66 yo male admitted with C diff, gout flare. Hx of liver transplant, gout, BPH   Clinical Impression   [name] is a 66 year old man who presents with pain. He is able to perform functional mobility and ADLs. Reports he has been able to walk to the bathroom. Overall he is able to perform tasks as he needs to - tolerating the pain. From a functional standpoint he is at his baseline - as he is very familiar with gout pain and flairs. Patient has no OT needs.      Recommendations for follow up therapy are one component of a multi-disciplinary discharge planning process, led by the attending physician.  Recommendations may be updated based on patient status, additional functional criteria and insurance authorization.   Follow Up Recommendations  No OT follow up    Assistance Recommended at Discharge None  Patient can return home with the following      Functional Status Assessment  Patient has not had a recent decline in their functional status  Equipment Recommendations  None recommended by OT    Recommendations for Other Services       Precautions / Restrictions Precautions Precautions: Fall Restrictions Weight Bearing Restrictions: No      Mobility Bed Mobility Overal bed mobility: Modified Independent                  Transfers Overall transfer level: Modified independent                 General transfer comment: Use of RW to stand and transfer to recliner to assist with LE pain.      Balance Overall balance assessment: Mild deficits observed, not formally tested                                         ADL either performed or assessed with clinical judgement   ADL Overall ADL's : Modified independent                                       General ADL  Comments: Increased time and modified positioning as needed secondary to pain but otherwise functional.     Vision Patient Visual Report: No change from baseline       Perception     Praxis      Pertinent Vitals/Pain Pain Assessment Pain Assessment: 0-10 Pain Score: 8  Pain Location: bil knee, ankles, feet Pain Descriptors / Indicators: Sharp, Aching, Guarding Pain Intervention(s): Limited activity within patient's tolerance     Hand Dominance Right   Extremity/Trunk Assessment Upper Extremity Assessment Upper Extremity Assessment: Overall WFL for tasks assessed   Lower Extremity Assessment Lower Extremity Assessment: Defer to PT evaluation   Cervical / Trunk Assessment Cervical / Trunk Assessment: Normal   Communication Communication Communication: No difficulties   Cognition Arousal/Alertness: Awake/alert Behavior During Therapy: WFL for tasks assessed/performed Overall Cognitive Status: Within Functional Limits for tasks assessed                                       General Comments  Exercises     Shoulder Instructions      Home Living Family/patient expects to be discharged to:: Private residence Living Arrangements: Children Available Help at Discharge: Family;Available PRN/intermittently Type of Home: House Home Access: Stairs to enter Entrance Stairs-Number of Steps: 1   Home Layout: Able to live on main level with bedroom/bathroom     Bathroom Shower/Tub: Teacher, early years/pre: Standard     Home Equipment: Conservation officer, nature (2 wheels);Rollator (4 wheels)          Prior Functioning/Environment Prior Level of Function : Independent/Modified Independent             Mobility Comments: still driving. doesn't normally use a device          OT Problem List: Pain      OT Treatment/Interventions:      OT Goals(Current goals can be found in the care plan section) Acute Rehab OT Goals OT Goal  Formulation: All assessment and education complete, DC therapy  OT Frequency:      Co-evaluation              AM-PAC OT "6 Clicks" Daily Activity     Outcome Measure Help from another person eating meals?: None Help from another person taking care of personal grooming?: None Help from another person toileting, which includes using toliet, bedpan, or urinal?: None Help from another person bathing (including washing, rinsing, drying)?: None Help from another person to put on and taking off regular upper body clothing?: None Help from another person to put on and taking off regular lower body clothing?: None 6 Click Score: 24   End of Session Equipment Utilized During Treatment: Rolling walker (2 wheels) Nurse Communication: Mobility status  Activity Tolerance: Patient limited by pain Patient left: in chair;with call bell/phone within reach  OT Visit Diagnosis: Pain                Time: 1517-6160 OT Time Calculation (min): 22 min Charges:  OT General Charges $OT Visit: 1 Visit OT Evaluation $OT Eval Low Complexity: 1 Low  Trenee Igoe, OTR/L [place]  Office [phone] Pager: [place] 11/01/2021, 1:19 PM

## 2021-11-01 NOTE — Progress Notes (Signed)
Progress Note   Patient: Devin White:[id number] DOB: 1956/02/24 DOA: 10/26/2021     5 DOS: the patient was seen and examined on 11/01/2021   Brief hospital course: 65yo with hx liver transplant, HTN, BPH, GERD presenting with N/V/D. He was recently discharged from [facility] for recurrent UTI. He was finishing a course of abx and felt nauseous each time he took the medication (ceftin). He began having diarrhea as well. He reports over 30 episodes of diarrhea since initiating the oral antibiotic. He has also had episodes of vomiting. This morning he felt extremely week and had a fever of 101. So he decided to come to the ED for evaluation. He denies any other aggravating or alleviating factors.   Assessment and Plan: No notes have been filed under this hospital service. Service: Hospitalist Liver transplant -Cont Mycophenolate 500 mg every morning, Mycophenolate 250 mg every evening, Tacrolimus 0.5 mg every morning, Tacrolimus 1 mg at bedtime -5/13 given patient's dehydration Tacrolimus level= 4.4 - Had been on prednisone '10mg'$  daily PTA, increased to solumedrol secondary to acute gout per below, to begin to taper back to to home prednisone tomorrow   Positive C. difficile Diarrhea - Complete course of vancomycin 125 mg QID -5/16 pharmacy has already priced out vancomycin for discharge.  See pharmacy tech note 5/15 -diarrhea initially improved, however, now seems worse today. Wean steroids per above and cont above abx   UTI/RIGHT side CVA pain and bladder pain. - Patient was unable to complete his course of antibiotics prescribed at [facility] for UTI. - 5/13 urine culture negative   Bilateral renal cysts - 5/14 US renal: Bilateral cysts, negative hydronephrosis see results below   AKI on CKD 3a(baseline Cr~1.2 - 1.4); -Avoid nephrotoxins -Cr up to 1.40 - Increased IVF to 125cc/hr -Repeat bmet in AM   Enlarged prostate -Prominent prostate indenting upon the bladder -  PSA 0.45 -CT abdomen and pelvis: Negative masses see results below   -BPH -5/14 increased Flomax 0.4 mg BID   Essential HTN -5/14 Amlodipine 5 mg daily      Hyperbilirubinemia -5/14 fractionated bilirubin pending   Hyperglycemia -Resolved - 5/12 Hemoglobin A1c= 6.2      Gout -5/16 Colchicine 0.6 mg daily -With worsening gout, had increased steroids to solumedrol -Gout now much improved. Will begin taper to baseline prednisone given concurrent cdiff coliits - 5/16 uric acid low at <1.5 -Pt is followed by tertiary care center for complex gout treatment   GERD - hold PPI given cdiff   Obesity (BMI 35.84 kg/m). -Address with PCP       Subjective: States gout symptoms have improved with IV steroids. Now having increased diarrhea  Physical Exam: Vitals:   11/01/21 0118 11/01/21 0300 11/01/21 0515 11/01/21 1240  BP: (!) 184/114 (!) 165/95 (!) 162/95 (!) 196/106  Pulse: 76 75 72 80  Resp: '18  18 16  '$ Temp: 97.7 F (36.5 C) 97.7 F (36.5 C) 97.8 F (36.6 C) 98 F (36.7 C)  TempSrc:   Oral   SpO2: 96% 96% 95% 99%  Weight:      Height:       General exam: Conversant, in no acute distress Respiratory system: normal chest rise, clear, no audible wheezing Cardiovascular system: regular rhythm, s1-s2 Gastrointestinal system: Nondistended, nontender, pos BS Central nervous system: No seizures, no tremors Extremities: No cyanosis, no joint deformities Skin: No rashes, no pallor Psychiatry: Affect normal // no auditory hallucinations   Data Reviewed:  Labs reviewed:  Cr 1.40, Uric acid <1.5, WBC 12.4  Family Communication: Pt in room, family not at bedside  Disposition: Status is: Inpatient Remains inpatient appropriate because: Severity of illness  Planned Discharge Destination: Home     Author: Marylu Lund, MD 11/01/2021 4:04 PM  For on call review [email or web address].

## 2021-11-01 NOTE — Progress Notes (Signed)
PT Cancellation Note  Patient Details Name: Devin White MRN: [id number] DOB: May 28, 1956   Cancelled Treatment:    Reason Eval/Treat Not Completed:  Attempted PT tx session-pt stated "I didn't think I was having PT because he (doctor) told me that he wasn't gonna worry about me having PT and my gout anymore." Will sign off at patient's request. Please reorder if needs change. Thanks.    [facility]  Office: [phone] Pager: [phone]

## 2021-11-02 ENCOUNTER — Inpatient Hospital Stay (HOSPITAL_COMMUNITY): Payer: Medicare Other

## 2021-11-02 DIAGNOSIS — N179 Acute kidney failure, unspecified: Secondary | ICD-10-CM | POA: Diagnosis not present

## 2021-11-02 DIAGNOSIS — M1A9XX1 Chronic gout, unspecified, with tophus (tophi): Secondary | ICD-10-CM | POA: Diagnosis not present

## 2021-11-02 LAB — CBC WITH DIFFERENTIAL/PLATELET
Abs Immature Granulocytes: 0.42 10*3/uL — ABNORMAL HIGH (ref 0.00–0.07)
Basophils Absolute: 0 10*3/uL (ref 0.0–0.1)
Basophils Relative: 0 %
Eosinophils Absolute: 0 10*3/uL (ref 0.0–0.5)
Eosinophils Relative: 0 %
HCT: 34.5 % — ABNORMAL LOW (ref 39.0–52.0)
Hemoglobin: 11.7 g/dL — ABNORMAL LOW (ref 13.0–17.0)
Immature Granulocytes: 3 %
Lymphocytes Relative: 5 %
Lymphs Abs: 0.8 10*3/uL (ref 0.7–4.0)
MCH: 32.3 pg (ref 26.0–34.0)
MCHC: 33.9 g/dL (ref 30.0–36.0)
MCV: 95.3 fL (ref 80.0–100.0)
Monocytes Absolute: 0.9 10*3/uL (ref 0.1–1.0)
Monocytes Relative: 6 %
Neutro Abs: 12.9 10*3/uL — ABNORMAL HIGH (ref 1.7–7.7)
Neutrophils Relative %: 86 %
Platelets: 181 10*3/uL (ref 150–400)
RBC: 3.62 MIL/uL — ABNORMAL LOW (ref 4.22–5.81)
RDW: 13.5 % (ref 11.5–15.5)
WBC: 15 10*3/uL — ABNORMAL HIGH (ref 4.0–10.5)
nRBC: 0 % (ref 0.0–0.2)

## 2021-11-02 LAB — COMPREHENSIVE METABOLIC PANEL
ALT: 34 U/L (ref 0–44)
AST: 20 U/L (ref 15–41)
Albumin: 3.3 g/dL — ABNORMAL LOW (ref 3.5–5.0)
Alkaline Phosphatase: 45 U/L (ref 38–126)
Anion gap: 10 (ref 5–15)
BUN: 27 mg/dL — ABNORMAL HIGH (ref 8–23)
CO2: 23 mmol/L (ref 22–32)
Calcium: 9.1 mg/dL (ref 8.9–10.3)
Chloride: 106 mmol/L (ref 98–111)
Creatinine, Ser: 1.17 mg/dL (ref 0.61–1.24)
GFR, Estimated: >60 mL/min (ref >60)
Glucose, Bld: 164 mg/dL — ABNORMAL HIGH (ref 70–99)
Potassium: 4.7 mmol/L (ref 3.5–5.1)
Sodium: 139 mmol/L (ref 135–145)
Total Bilirubin: 0.7 mg/dL (ref 0.3–1.2)
Total Protein: 6.3 g/dL — ABNORMAL LOW (ref 6.5–8.1)

## 2021-11-02 LAB — MAGNESIUM: Magnesium: 2.4 mg/dL (ref 1.7–2.4)

## 2021-11-02 LAB — PHOSPHORUS: Phosphorus: 4.3 mg/dL (ref 2.5–4.6)

## 2021-11-02 LAB — GLUCOSE, CAPILLARY: Glucose-Capillary: 160 mg/dL — ABNORMAL HIGH (ref 70–99)

## 2021-11-02 MED ORDER — ACETAMINOPHEN 325 MG PO TABS
650.0000 mg | ORAL_TABLET | Freq: Four times a day (QID) | ORAL | Status: DC | PRN
  Administered 2021-11-02 – 2021-11-06 (×8): 650 mg via ORAL
  Filled 2021-11-02 (×8): qty 2

## 2021-11-02 MED ORDER — OXYCODONE HCL 5 MG PO TABS
5.0000 mg | ORAL_TABLET | Freq: Once | ORAL | Status: AC
  Administered 2021-11-02: 5 mg via ORAL

## 2021-11-02 MED ORDER — LABETALOL HCL 5 MG/ML IV SOLN
5.0000 mg | INTRAVENOUS | Status: DC | PRN
  Administered 2021-11-02: 5 mg via INTRAVENOUS
  Filled 2021-11-02: qty 4

## 2021-11-02 MED ORDER — HYDRALAZINE HCL 20 MG/ML IJ SOLN
10.0000 mg | INTRAMUSCULAR | Status: DC | PRN
  Administered 2021-11-02: 10 mg via INTRAVENOUS
  Filled 2021-11-02: qty 1

## 2021-11-02 MED ORDER — SODIUM CHLORIDE (PF) 0.9 % IJ SOLN
INTRAMUSCULAR | Status: AC
  Filled 2021-11-02: qty 50

## 2021-11-02 MED ORDER — IOHEXOL 350 MG/ML SOLN
75.0000 mL | Freq: Once | INTRAVENOUS | Status: AC | PRN
  Administered 2021-11-02: 75 mL via INTRAVENOUS

## 2021-11-02 MED ORDER — ASPIRIN 325 MG PO TABS
325.0000 mg | ORAL_TABLET | Freq: Every day | ORAL | Status: DC
  Administered 2021-11-02 – 2021-11-07 (×6): 325 mg via ORAL
  Filled 2021-11-02 (×6): qty 1

## 2021-11-02 MED ORDER — AMLODIPINE BESYLATE 10 MG PO TABS
10.0000 mg | ORAL_TABLET | Freq: Every day | ORAL | Status: DC
  Administered 2021-11-02: 5 mg via ORAL
  Administered 2021-11-03 – 2021-11-07 (×5): 10 mg via ORAL
  Filled 2021-11-02 (×6): qty 1

## 2021-11-02 MED ORDER — KETOROLAC TROMETHAMINE 30 MG/ML IJ SOLN
30.0000 mg | Freq: Once | INTRAMUSCULAR | Status: AC
Start: 2021-11-02 — End: 2021-11-02
  Administered 2021-11-02: 30 mg via INTRAVENOUS
  Filled 2021-11-02: qty 1

## 2021-11-02 MED ORDER — KETOROLAC TROMETHAMINE 30 MG/ML IJ SOLN
30.0000 mg | Freq: Once | INTRAMUSCULAR | Status: AC
  Administered 2021-11-02: 30 mg via INTRAVENOUS
  Filled 2021-11-02: qty 1

## 2021-11-02 NOTE — Progress Notes (Signed)
Patient called out saying increased headache, left facial numbness/tingling, left arm and leg weakness/tingling similar to episode that happened this morning. Dr. Wyline Copas notified and he came to bedside to evaluate patient. B/P 184/96. Rapid response notified of patient event.

## 2021-11-02 NOTE — Progress Notes (Signed)
0424-Code stroke activated, pt in hallway post CT scan with NP Oran Rein at bedside. Told LKW [id number], recognition of symptoms-0327-HA with pain radiating down neck to L arm with L side weakness/numbness 0431 pt back in room 0432 Dr Lyndle Herrlich, telespecialist, on camera

## 2021-11-02 NOTE — Progress Notes (Addendum)
       CROSS COVER NOTE  NAME: Devin White MRN: [id number] DOB : 23-Mar-1956  Code stroke called this morning for new onset headache with pain radiating to neck and (L) arm with associated (L) sided weakness and numbness. CT Angio Head/Neck W/WO NEGATIVE. Follow up MRI and Aspirin recommended by teleneuro physician.  Upgraded to progressive for increased frequency neuro checks.  Please see teleneurology note for full A/P.   Neomia Glass DNP, MHA, FNP-BC Nurse Practitioner [facility] Pager [phone]

## 2021-11-02 NOTE — Progress Notes (Signed)
Patient arrived to 1442 in NAD, VS stable and patient free from pain. Patient oriented to room and call bell in reach.

## 2021-11-02 NOTE — Consult Note (Signed)
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 Stroke alert was called due to left sided pressure and numbness. TNK was not given as he has mild drift, numbness, and has an NIHSS 3.  Our recommendations are outlined below.  Recommendations:        Stroke/Telemetry Floor       Neuro Checks       Bedside Swallow Eval       DVT Prophylaxis       IV Fluids, Normal Saline       Head of Bed 30 Degrees       Euglycemia and Avoid Hyperthermia (PRN Acetaminophen)       Initiate or continue Aspirin 325 MG daily       Antihypertensives PRN if Blood pressure is greater than 220/120 or there is a concern for End organ damage/contraindications for permissive HTN. If blood pressure is greater than 220/120 give labetalol PO or IV or Vasotec IV with a goal of 15% reduction in BP during the first 24 hours.       - MRI Brain without contrast       - TTE       - Labs: lipid panel; HgbA1c       - PT/OT/ST where applicable   Sign Out:       Discussed with Primary Attending    ------------------------------------------------------------------------------  Advanced Imaging: CTA Head and Neck Completed.  LVO:No  Patient doesn't meet criteria for emergent NIR consideration   Metrics: Last Known Well: 11/02/2021 03:12:00 [facility] Notification Time: 11/02/2021 04:28:02 Stamp Time: 11/02/2021 04:28:02 Initial Response Time: 11/02/2021 04:32:21 Symptoms: Left sided pressure and numbness. Initial patient interaction: 11/02/2021 04:36:15 NIHSS Assessment Completed: 11/02/2021  04:37:30 Patient is not a candidate for Thrombolytic. Thrombolytic Medical Decision: 11/02/2021 04:40:00 Patient was not deemed candidate for Thrombolytic because of following reasons: No disabling symptoms.  CT head showed no acute hemorrhage or acute core infarct.  Primary Provider Notified of Diagnostic Impression and Management Plan on: 11/02/2021 04:59:32 Spoke With: NP coverning overnight Able to Reach 11/02/2021 04:59:32    ------------------------------------------------------------------------------  History of Present Illness: Patient is a 66 year old Male.  Inpatient stroke alert was called for symptoms of Left sided pressure and numbness. Devin White is a 66 year-old man with a PMH of HTN, Gout, Mesenteric Thrombosis, CKD, Liver Transplant who was admitted on 10/26/2021 due C.Diff. Stroke alert was called due to left sided pressure and numbness. Devin White reports that he developed a really bad headache which started to radiate down his neck into his left arm. He says that he then started to feel a pressure and numb sensation in the left arm. He denies ever having had symptoms like this before.   Past Medical History:      Hypertension  Medications:  No Anticoagulant use  No Antiplatelet use Reviewed EMR for current medications  Allergies:  NKDA  Social History: Drug Use: No  Family History:  There is no family history of premature cerebrovascular disease pertinent to this consultation  ROS : 14 Points Review  of Systems was performed and was negative except mentioned in HPI.  Past Surgical History: There Is No Surgical History Contributory To Today's Visit  NIHSS may not be reliable due to: Left ankle gout  Examination: BP(167/105), Pulse(72), 1A: Level of Consciousness - Alert; keenly responsive + 0 1B: Ask Month and Age - Both Questions Right + 0 1C: Blink Eyes & Squeeze Hands - Performs Both Tasks + 0 2: Test Horizontal Extraocular Movements -  Normal + 0 3: Test Visual Fields - No Visual Loss + 0 4: Test Facial Palsy (Use Grimace if Obtunded) - Normal symmetry + 0 5A: Test Left Arm Motor Drift - Drift, but doesn't hit bed + 1 5B: Test Right Arm Motor Drift - No Drift for 10 Seconds + 0 6A: Test Left Leg Motor Drift - Drift, but doesn't hit bed + 1 6B: Test Right Leg Motor Drift - No Drift for 5 Seconds + 0 7: Test Limb Ataxia (FNF/Heel-Shin) - No Ataxia + 0 8: Test Sensation - Mild-Moderate Loss: Less Sharp/More Dull + 1 9: Test Language/Aphasia - Normal; No aphasia + 0 10: Test Dysarthria - Normal + 0 11: Test Extinction/Inattention - No abnormality + 0  NIHSS Score: 3   Pre-Morbid Modified Rankin Scale: 0 Points = No symptoms at all   Patient/Family was informed the Neurology Consult would occur via TeleHealth consult by way of interactive audio and video telecommunications and consented to receiving care in this manner.   Patient is being evaluated for possible acute neurologic impairment and high probability of imminent or life-threatening deterioration. I spent total of 34 minutes providing care to this patient, including time for face to face visit via telemedicine, review of medical records, imaging studies and discussion of findings with providers, the patient and/or family.   Dr Asa Saunas   [facility] [phone]   Case [id number]

## 2021-11-02 NOTE — Progress Notes (Signed)
Report given to RN from 4W. Still waiting for the bed to be cleaned per RN.

## 2021-11-02 NOTE — Progress Notes (Signed)
MRI negative for acute stroke. Spoke with Dr. Wyline Copas and ok to D/C Q2 hour neruo checks/NIH and Q2 hour VS.

## 2021-11-02 NOTE — Progress Notes (Signed)
0310: on my rounds to pt room, pt checked vitals, BP 168/100, HR 70, RR 18, O2 at 95, pt complaining of head ache and after 5-10 minutes pt was complaining of headache radiating to neck and left shoulder down to left arm with pressure and tightness per pt.  Notified hospitalist on call and consulted Rapid Response team for secondary assessment.   Pt was sent to CT and neurologist was consulted.  Pt is back to room. Will continue to monitor.

## 2021-11-02 NOTE — Progress Notes (Signed)
Progress Note   Patient: Devin White QQV:[id number] DOB: February 02, 1956 DOA: 10/26/2021     6 DOS: the patient was seen and examined on 11/02/2021   Brief hospital course: 66yo with hx liver transplant, HTN, BPH, GERD presenting with N/V/D. He was recently discharged from [facility] for recurrent UTI. He was finishing a course of abx and felt nauseous each time he took the medication (ceftin). He began having diarrhea as well. He reports over 30 episodes of diarrhea since initiating the oral antibiotic. He has also had episodes of vomiting. This morning he felt extremely week and had a fever of 101. So he decided to come to the ED for evaluation. He denies any other aggravating or alleviating factors.   Assessment and Plan: No notes have been filed under this hospital service. Service: Hospitalist Liver transplant -Cont Mycophenolate 500 mg every morning, Mycophenolate 250 mg every evening, Tacrolimus 0.5 mg every morning, Tacrolimus 1 mg at bedtime -5/13 given patient's dehydration Tacrolimus level= 4.4 - Had been on prednisone '10mg'$  daily PTA, increased to solumedrol secondary to acute gout per below, to begin to taper back to to home prednisone tomorrow   Positive C. difficile Diarrhea - Plan to complete course of vancomycin 125 mg QID -5/16 pharmacy has already priced out vancomycin for discharge.  See pharmacy tech note 5/15 -diarrhea has improved -WBC trending up, however is likely secondary to recent high dosed steroids for gout flare per below   UTI/RIGHT side CVA pain and bladder pain. - Patient was unable to complete his course of antibiotics prescribed at [facility] for UTI. - 5/13 urine culture negative   Bilateral renal cysts - 5/14 US renal: Bilateral cysts, negative hydronephrosis see results below   AKI on CKD 3a(baseline Cr~1.2 - 1.4); -Avoid nephrotoxins -Cr peaked to 1.40 - Renal function improved with IVF -Repeat bmet in AM   Enlarged prostate -Prominent  prostate indenting upon the bladder - PSA 0.45 -CT abdomen and pelvis: Negative masses see results below   -BPH -5/14 increased Flomax 0.4 mg BID   Essential HTN --BP poorly controlled  -Increase norvasc from '5mg'$  to '10mg'$   - Continue PRN hydralazine and labetalol   Hyperbilirubinemia -5/14 fractionated bilirubin pending   Hyperglycemia -Resolved - 5/12 Hemoglobin A1c= 6.2      Gout -5/16 Colchicine 0.6 mg daily -With worsening gout, recently given brief course of solumedrol -Gout now much improved. Beginning steroids taper to goal of home prednisone - 5/16 uric acid low at <1.5 -Pt is followed by tertiary care center for complex gout treatment   GERD - hold PPI given cdiff   Obesity (BMI 35.84 kg/m). -Address with PCP  L sided weakness and numbness - Acute L sided weakness this AM prompting Code Stroke, see event documentation - CTA head/neck neg and f/u MRI brain neg for acute CVA - BP poorly controlled with complaints of L sided headache -Headache improved with toradol with improvement of weakness and numbness. Symptoms improved but did not fully resolve -Later in the afternoon, pt had recurrent episode of marked L sided headache with nunbness and "burning" down face and down L chest. Also associated with increased L sided weakness and blurry L eye vision - Discussed case with Neurology who recommends transfer to [facility] to continue with neurologic eval - For now, will give another dose of toradol, cont to treat HTN as tolerated with goal of normotension gradually -65mn critical care time spent at bedside assessing pt, coordinating care with specialist  Subjective: Code stroke event this AM, see documentation. This AM, pt reports continued L sided headache with L arm and LLE weakness that seems to be gradually improving. Also reported L blurry vision and "burning" over L side of face and down L chest. Headache was preceeded by "black dots."  Physical Exam: Vitals:    11/02/21 1240 11/02/21 1555 11/02/21 1652 11/02/21 1741  BP: (!) 157/101 (!) 195/105 (!) 184/96 (!) 195/103  Pulse: 68     Resp: 16  (!) 22   Temp: 97.8 F (36.6 C)     TempSrc: Oral     SpO2: 96%  98% 98%  Weight:      Height:       General exam: Awake, laying in bed, in nad Respiratory system: Normal respiratory effort, no wheezing Cardiovascular system: regular rate, s1, s2 Gastrointestinal system: Soft, nondistended, positive BS Central nervous system: CN2-12 grossly intact, LLE 3/5 strength, LUE3-4/5 strength, R side 5/5 strength Extremities: Perfused, no clubbing Skin: Normal skin turgor, no notable skin lesions seen Psychiatry: Mood normal // no visual hallucinations   Data Reviewed:  Labs reviewed: Cr 1.17, Uric acid <1.5, WBC 15.0  Family Communication: Pt in room, family not at bedside  Disposition: Status is: Inpatient Remains inpatient appropriate because: Severity of illness  Planned Discharge Destination: Home     Author: Marylu Lund, MD 11/02/2021 5:57 PM  For on call review [email or web address].

## 2021-11-03 DIAGNOSIS — R197 Diarrhea, unspecified: Secondary | ICD-10-CM | POA: Diagnosis not present

## 2021-11-03 DIAGNOSIS — N400 Enlarged prostate without lower urinary tract symptoms: Secondary | ICD-10-CM | POA: Diagnosis not present

## 2021-11-03 LAB — CBC WITH DIFFERENTIAL/PLATELET
Abs Immature Granulocytes: 0.38 10*3/uL — ABNORMAL HIGH (ref 0.00–0.07)
Basophils Absolute: 0 10*3/uL (ref 0.0–0.1)
Basophils Relative: 0 %
Eosinophils Absolute: 0 10*3/uL (ref 0.0–0.5)
Eosinophils Relative: 0 %
HCT: 35.4 % — ABNORMAL LOW (ref 39.0–52.0)
Hemoglobin: 12.3 g/dL — ABNORMAL LOW (ref 13.0–17.0)
Immature Granulocytes: 3 %
Lymphocytes Relative: 9 %
Lymphs Abs: 1.1 10*3/uL (ref 0.7–4.0)
MCH: 33.4 pg (ref 26.0–34.0)
MCHC: 34.7 g/dL (ref 30.0–36.0)
MCV: 96.2 fL (ref 80.0–100.0)
Monocytes Absolute: 0.9 10*3/uL (ref 0.1–1.0)
Monocytes Relative: 7 %
Neutro Abs: 9.7 10*3/uL — ABNORMAL HIGH (ref 1.7–7.7)
Neutrophils Relative %: 81 %
Platelets: 205 10*3/uL (ref 150–400)
RBC: 3.68 MIL/uL — ABNORMAL LOW (ref 4.22–5.81)
RDW: 13.6 % (ref 11.5–15.5)
WBC: 12 10*3/uL — ABNORMAL HIGH (ref 4.0–10.5)
nRBC: 0 % (ref 0.0–0.2)

## 2021-11-03 LAB — COMPREHENSIVE METABOLIC PANEL
ALT: 46 U/L — ABNORMAL HIGH (ref 0–44)
AST: 20 U/L (ref 15–41)
Albumin: 3.4 g/dL — ABNORMAL LOW (ref 3.5–5.0)
Alkaline Phosphatase: 48 U/L (ref 38–126)
Anion gap: 8 (ref 5–15)
BUN: 26 mg/dL — ABNORMAL HIGH (ref 8–23)
CO2: 28 mmol/L (ref 22–32)
Calcium: 9.2 mg/dL (ref 8.9–10.3)
Chloride: 105 mmol/L (ref 98–111)
Creatinine, Ser: 1.43 mg/dL — ABNORMAL HIGH (ref 0.61–1.24)
GFR, Estimated: 55 mL/min — ABNORMAL LOW (ref >60)
Glucose, Bld: 178 mg/dL — ABNORMAL HIGH (ref 70–99)
Potassium: 4.4 mmol/L (ref 3.5–5.1)
Sodium: 141 mmol/L (ref 135–145)
Total Bilirubin: 0.8 mg/dL (ref 0.3–1.2)
Total Protein: 6.3 g/dL — ABNORMAL LOW (ref 6.5–8.1)

## 2021-11-03 LAB — PHOSPHORUS: Phosphorus: 4.7 mg/dL — ABNORMAL HIGH (ref 2.5–4.6)

## 2021-11-03 LAB — MAGNESIUM: Magnesium: 2.8 mg/dL — ABNORMAL HIGH (ref 1.7–2.4)

## 2021-11-03 NOTE — Progress Notes (Signed)
Progress Note   Patient: Devin White QIO:[id number] DOB: 1955/08/16 DOA: 10/26/2021     7 DOS: the patient was seen and examined on 11/03/2021   Brief hospital course: 66yo with hx liver transplant, HTN, BPH, GERD presenting with N/V/D. He was recently discharged from [facility] for recurrent UTI. He was finishing a course of abx and felt nauseous each time he took the medication (ceftin). He began having diarrhea as well. He reports over 30 episodes of diarrhea since initiating the oral antibiotic. He has also had episodes of vomiting. This morning he felt extremely week and had a fever of 101. So he decided to come to the ED for evaluation. He denies any other aggravating or alleviating factors.   Assessment and Plan: No notes have been filed under this hospital service. Service: Hospitalist Liver transplant -Cont Mycophenolate 500 mg every morning, Mycophenolate 250 mg every evening, Tacrolimus 0.5 mg every morning, Tacrolimus 1 mg at bedtime -5/13 given patient's dehydration Tacrolimus level= 4.4 - Had been on prednisone '10mg'$  daily PTA, currently tapering prednisone to PTA dosing   Positive C. difficile Diarrhea - Plan to complete course of vancomycin 125 mg QID -5/16 pharmacy has already priced out vancomycin for discharge.  See pharmacy tech note 5/15 -diarrhea continues to improve -WBC trended up, now going down, however is likely secondary to recent high dosed steroids for gout flare per below   UTI/RIGHT side CVA pain and bladder pain. - Patient was unable to complete his course of antibiotics prescribed at [facility] for UTI. - 5/13 urine culture negative   Bilateral renal cysts - 5/14 US renal: Bilateral cysts, negative hydronephrosis see results below   AKI on CKD 3a(baseline Cr~1.2 - 1.4); -Avoid nephrotoxins - Renal function improved with IVF -Repeat bmet in AM -Cr seems to be at baseline today at 1.43. voiding well   Enlarged prostate -Prominent prostate  indenting upon the bladder - PSA 0.45 -CT abdomen and pelvis: Negative masses see results below   -BPH -5/14 increased Flomax 0.4 mg BID   Essential HTN --BP now better controlled  -Recently increased norvasc from '5mg'$  to '10mg'$   - Continue PRN hydralazine and labetalol   Hyperbilirubinemia -5/14 fractionated bilirubin pending   Hyperglycemia -Resolved - 5/12 Hemoglobin A1c= 6.2      Gout -5/16 Colchicine 0.6 mg daily -Recent flare of gout this visit -Gout now much improved after IV steroids. Now weaning steroids to PTA dosing - 5/16 uric acid low at <1.5 -Pt is followed by tertiary care center for complex gout treatment   GERD - hold PPI given cdiff   Obesity (BMI 35.84 kg/m). -Address with PCP  L sided weakness and numbness - Acute L sided weakness in AM of 5/19 prompting Code Stroke, see event documentation - CTA head/neck neg and f/u MRI brain neg for acute CVA - BP poorly controlled with complaints of L sided headache -Headache improved with toradol with improvement of weakness and numbness. Symptoms improved but did not fully resolve -Later in the afternoon, pt had recurrent episode of marked L sided headache with nunbness and "burning" down face and down L chest. Also associated with increased L sided weakness and blurry L eye vision - Discussed case with Neurology who recommends transfer to [facility] to continue with neurologic eval - symptoms seem to correlate to headache, which has improved but not fully resolved -Pending tx to [facility] for Neuro eval       Subjective: States headache and weakness now improved. Still  having "blurry" vision and abnormal sensation over L side of face  Physical Exam: Vitals:   11/03/21 0429 11/03/21 0459 11/03/21 1308 11/03/21 1544  BP: (!) 164/107 (!) 159/91 (!) 160/95 (!) 146/89  Pulse: 69 60 77 92  Resp: '16 20 17 17  '$ Temp: 97.9 F (36.6 C) 98 F (36.7 C) 98.1 F (36.7 C) 98 F (36.7 C)  TempSrc: Oral Oral Oral Oral  SpO2: 98%  98% 97% 95%  Weight:      Height:       General exam: Conversant, in no acute distress Respiratory system: normal chest rise, clear, no audible wheezing Cardiovascular system: regular rhythm, s1-s2 Gastrointestinal system: Nondistended, nontender, pos BS Central nervous system: No seizures, no tremors Extremities: No cyanosis, no joint deformities Skin: No rashes, no pallor Psychiatry: Affect normal // no auditory hallucinations   Data Reviewed:  Labs reviewed: Cr 1.43, Uric acid <1.5, WBC 12.0  Family Communication: Pt in room, family not at bedside  Disposition: Status is: Inpatient Remains inpatient appropriate because: Severity of illness  Planned Discharge Destination: Home     Author: Marylu Lund, MD 11/03/2021 5:17 PM  For on call review [email or web address].

## 2021-11-03 NOTE — Progress Notes (Signed)
Spoke with Bed Placement [phone] at [facility]. No beds are available at this time.

## 2021-11-04 DIAGNOSIS — N179 Acute kidney failure, unspecified: Secondary | ICD-10-CM | POA: Diagnosis not present

## 2021-11-04 DIAGNOSIS — A09 Infectious gastroenteritis and colitis, unspecified: Secondary | ICD-10-CM | POA: Diagnosis not present

## 2021-11-04 DIAGNOSIS — R197 Diarrhea, unspecified: Secondary | ICD-10-CM | POA: Diagnosis not present

## 2021-11-04 DIAGNOSIS — G459 Transient cerebral ischemic attack, unspecified: Secondary | ICD-10-CM | POA: Diagnosis not present

## 2021-11-04 DIAGNOSIS — M1A9XX1 Chronic gout, unspecified, with tophus (tophi): Secondary | ICD-10-CM | POA: Diagnosis not present

## 2021-11-04 LAB — CBC WITH DIFFERENTIAL/PLATELET
Abs Immature Granulocytes: 0.5 10*3/uL — ABNORMAL HIGH (ref 0.00–0.07)
Basophils Absolute: 0 10*3/uL (ref 0.0–0.1)
Basophils Relative: 0 %
Eosinophils Absolute: 0 10*3/uL (ref 0.0–0.5)
Eosinophils Relative: 0 %
HCT: 37.2 % — ABNORMAL LOW (ref 39.0–52.0)
Hemoglobin: 12.7 g/dL — ABNORMAL LOW (ref 13.0–17.0)
Immature Granulocytes: 4 %
Lymphocytes Relative: 9 %
Lymphs Abs: 1.1 10*3/uL (ref 0.7–4.0)
MCH: 32.4 pg (ref 26.0–34.0)
MCHC: 34.1 g/dL (ref 30.0–36.0)
MCV: 94.9 fL (ref 80.0–100.0)
Monocytes Absolute: 1.1 10*3/uL — ABNORMAL HIGH (ref 0.1–1.0)
Monocytes Relative: 9 %
Neutro Abs: 9.9 10*3/uL — ABNORMAL HIGH (ref 1.7–7.7)
Neutrophils Relative %: 78 %
Platelets: 234 10*3/uL (ref 150–400)
RBC: 3.92 MIL/uL — ABNORMAL LOW (ref 4.22–5.81)
RDW: 13.5 % (ref 11.5–15.5)
WBC: 12.6 10*3/uL — ABNORMAL HIGH (ref 4.0–10.5)
nRBC: 0.2 % (ref 0.0–0.2)

## 2021-11-04 LAB — URINALYSIS, ROUTINE W REFLEX MICROSCOPIC
Bilirubin Urine: NEGATIVE
Glucose, UA: NEGATIVE mg/dL
Hgb urine dipstick: NEGATIVE
Ketones, ur: NEGATIVE mg/dL
Leukocytes,Ua: NEGATIVE
Nitrite: NEGATIVE
Protein, ur: NEGATIVE mg/dL
Specific Gravity, Urine: 1.011 (ref 1.005–1.030)
pH: 7 (ref 5.0–8.0)

## 2021-11-04 LAB — COMPREHENSIVE METABOLIC PANEL
ALT: 53 U/L — ABNORMAL HIGH (ref 0–44)
AST: 22 U/L (ref 15–41)
Albumin: 3.2 g/dL — ABNORMAL LOW (ref 3.5–5.0)
Alkaline Phosphatase: 50 U/L (ref 38–126)
Anion gap: 9 (ref 5–15)
BUN: 35 mg/dL — ABNORMAL HIGH (ref 8–23)
CO2: 26 mmol/L (ref 22–32)
Calcium: 9.1 mg/dL (ref 8.9–10.3)
Chloride: 102 mmol/L (ref 98–111)
Creatinine, Ser: 1.7 mg/dL — ABNORMAL HIGH (ref 0.61–1.24)
GFR, Estimated: 45 mL/min — ABNORMAL LOW (ref >60)
Glucose, Bld: 193 mg/dL — ABNORMAL HIGH (ref 70–99)
Potassium: 4.5 mmol/L (ref 3.5–5.1)
Sodium: 137 mmol/L (ref 135–145)
Total Bilirubin: 0.3 mg/dL (ref 0.3–1.2)
Total Protein: 5.7 g/dL — ABNORMAL LOW (ref 6.5–8.1)

## 2021-11-04 LAB — PHOSPHORUS: Phosphorus: 3.4 mg/dL (ref 2.5–4.6)

## 2021-11-04 LAB — CREATININE, URINE, RANDOM: Creatinine, Urine: 43.09 mg/dL

## 2021-11-04 LAB — MAGNESIUM: Magnesium: 2.7 mg/dL — ABNORMAL HIGH (ref 1.7–2.4)

## 2021-11-04 LAB — SODIUM, URINE, RANDOM: Sodium, Ur: 107 mmol/L

## 2021-11-04 LAB — VITAMIN C: Vitamin C: 0.6 mg/dL (ref 0.4–2.0)

## 2021-11-04 MED ORDER — SODIUM CHLORIDE 0.9 % IV SOLN: INTRAVENOUS | Status: DC

## 2021-11-04 NOTE — Progress Notes (Signed)
[facility]                                                                               Devin White, is a 66 y.o. male, DOB - 12-14-1955, ZDG:[id number] Admit date - 10/26/2021    Outpatient Primary MD for the patient is Viviana Simpler I, MD  LOS - 8  days    Brief summary   66yo with hx liver transplant, HTN, BPH, GERD presenting with N/V/D. He was recently discharged from [facility] for recurrent UTI. He was finishing a course of abx and felt nauseous each time he took the medication (ceftin). He began having diarrhea as well. He reports over 30 episodes of diarrhea since initiating the oral antibiotic. 2 days ago patient had left sided weakness with left sided headache and he was transferred to [facility] for evaluation of stroke. Neurology on board.   Assessment & Plan    Assessment and Plan:   C diff colitis: On oral vancomycin 125 mg QID to complete the course.  No diarrhea so far.    Bilateral Renal cystis:  -recommend outpatient follow up with Urology.    TIA:  With left sided residual paraesthesias.  CTA of the head and neck .  MRI negative for acute or sub acute infarct.  Echocardiogram is pending.  Hemoglobin A1c is 6.2% LDL is pending.  Therapy evaluations pending.     Hypertension;  Permissive hypertension.    Hyperlipidemia:  On statin.    Body mass index is 35.84 kg/m. OBESITY;  Weight loss recommended.     H/o Liver Transplant:  On tacrolimus and mycophenolate.    Leukocytosis: persistent possibly from c diff diarrhea and colitis.    Mild normocytic anemia:  Continue to monitor.     Gouty flare up  On prednisone,  Colchicine held for AKI.     AKI:  - Baseline creatinine around 1.4.  - creatinine increased to 1.7.  Start gentle hydration, and stop the colchicine.     Malnutrition Type:  Nutrition Problem: Increased nutrient needs Etiology: acute illness, diarrhea   Malnutrition  Characteristics:  Signs/Symptoms: estimated needs   Nutrition Interventions:  Interventions: Ensure Enlive (each supplement provides 350kcal and 20 grams of protein), MVI, Liberalize Diet  Estimated body mass index is 35.84 kg/m as calculated from the following:   Height as of this encounter: '5\' 11"'$  (1.803 m).   Weight as of this encounter: 116.6 kg.  Code Status: full code DVT Prophylaxis:  SCDs Start: 10/26/21 1338   Level of Care: Level of care: Telemetry Medical Family Communication: none at bedside.   Disposition Plan:     Remains inpatient appropriate:    Procedures:  none  Consultants:   neurology  Antimicrobials:   Anti-infectives (From admission, onward)    Start     Dose/Rate Route Frequency Ordered Stop   10/26/21 1915  vancomycin (VANCOCIN) capsule 125 mg        125 mg Oral 4 times daily 10/26/21 1827 11/05/21 1659        Medications  Scheduled Meds:  alum & mag hydroxide-simeth  30 mL Oral Q6H   amLODipine  10 mg Oral Daily  vitamin C  500 mg Oral Daily   aspirin  325 mg Oral Daily   colchicine  0.6 mg Oral Daily   feeding supplement  237 mL Oral BID BM   multivitamin with minerals  1 tablet Oral Daily   mycophenolate  500 mg Oral q morning   And   mycophenolate  250 mg Oral QPM   predniSONE  40 mg Oral Q breakfast   tacrolimus  0.5 mg Oral q morning   And   tacrolimus  1 mg Oral QHS   tamsulosin  0.4 mg Oral BID   vancomycin  125 mg Oral QID   vitamin A  10,000 Units Oral Daily   zinc sulfate  220 mg Oral Daily   Continuous Infusions: PRN Meds:.acetaminophen, hydrALAZINE, labetalol, oxyCODONE, prochlorperazine    Subjective:   Devin White was seen and examined today.    Objective:   Vitals:   11/03/21 2220 11/03/21 2223 11/04/21 0455 11/04/21 0800  BP: (!) 165/97 (!) 165/97 131/88 (!) 169/96  Pulse: 86 86 76 72  Resp:  '16 16 16  '$ Temp: 97.7 F (36.5 C) 97.7 F (36.5 C) 98 F (36.7 C) 98 F (36.7 C)  TempSrc: Oral  Oral Oral Oral  SpO2: 98% 98% 95% 97%  Weight:      Height:        Intake/Output Summary (Last 24 hours) at 11/04/2021 1108 Last data filed at 11/04/2021 1000 Gross per 24 hour  Intake 600 ml  Output 1350 ml  Net -750 ml   Filed Weights   10/26/21 0830  Weight: 116.6 kg     Exam General exam: Appears calm and comfortable  Respiratory system: Clear to auscultation. Respiratory effort normal. Cardiovascular system: S1 & S2 heard, RRR. No JVD, . No pedal edema. Gastrointestinal system: Abdomen is nondistended, soft and nontender. Normal bowel sounds heard. Central nervous system: Alert and oriented. Left sided facial numbness, left sided tingling and numbness present.  Extremities: Symmetric 5 x 5 power. Skin: No rashes, lesions or ulcers Psychiatry: Mood & affect appropriate.     Data Reviewed:  I have personally reviewed following labs and imaging studies   CBC Lab Results  Component Value Date   WBC 12.6 (H) 11/04/2021   RBC 3.92 (L) 11/04/2021   HGB 12.7 (L) 11/04/2021   HCT 37.2 (L) 11/04/2021   MCV 94.9 11/04/2021   MCH 32.4 11/04/2021   PLT 234 11/04/2021   MCHC 34.1 11/04/2021   RDW 13.5 11/04/2021   LYMPHSABS 1.1 11/04/2021   MONOABS 1.1 (H) 11/04/2021   EOSABS 0.0 11/04/2021   BASOSABS 0.0 25/85/2778     Last metabolic panel Lab Results  Component Value Date   NA 137 11/04/2021   K 4.5 11/04/2021   CL 102 11/04/2021   CO2 26 11/04/2021   BUN 35 (H) 11/04/2021   CREATININE 1.70 (H) 11/04/2021   GLUCOSE 193 (H) 11/04/2021   GFRNONAA 45 (L) 11/04/2021   GFRAA 48 (L) 08/11/2019   CALCIUM 9.1 11/04/2021   PHOS 3.4 11/04/2021   PROT 5.7 (L) 11/04/2021   ALBUMIN 3.2 (L) 11/04/2021   BILITOT 0.3 11/04/2021   ALKPHOS 50 11/04/2021   AST 22 11/04/2021   ALT 53 (H) 11/04/2021   ANIONGAP 9 11/04/2021    CBG (last 3)  Recent Labs    11/02/21 0441  GLUCAP 160*      Coagulation Profile: No results for input(s): INR, PROTIME in the last 168  hours.   Radiology Studies:  No results found.     Hosie Poisson M.D. [facility] 11/04/2021, 11:08 AM  Available via Epic secure chat 7am-7pm After 7 pm, please refer to night coverage provider listed on amion.

## 2021-11-04 NOTE — Progress Notes (Addendum)
STROKE TEAM PROGRESS NOTE   INTERVAL HISTORY   Recently seen at [facility] labs per his transplant team. They discovered a UTI, he was given oral antibiotics and then developed C-diff. He reported diarrhea, nausea, and vomiting and was admitted to [facility].  Got up Friday in his hospital room to go to the bathroom and then shave and noticed his left arm and left leg were weak. Left eye wouldn't open, severe headache. Face tingling/numbness/burning. Improved and then came back. He is still reporting some left side weakness and headache.   Vitals:   11/03/21 2220 11/03/21 2223 11/04/21 0455 11/04/21 0800  BP: (!) 165/97 (!) 165/97 131/88 (!) 169/96  Pulse: 86 86 76 72  Resp:  '16 16 16  '$ Temp: 97.7 F (36.5 C) 97.7 F (36.5 C) 98 F (36.7 C) 98 F (36.7 C)  TempSrc: Oral Oral Oral Oral  SpO2: 98% 98% 95% 97%  Weight:      Height:       CBC:  Recent Labs  Lab 11/03/21 0421 11/04/21 0054  WBC 12.0* 12.6*  NEUTROABS 9.7* 9.9*  HGB 12.3* 12.7*  HCT 35.4* 37.2*  MCV 96.2 94.9  PLT 205 741   Basic Metabolic Panel:  Recent Labs  Lab 11/03/21 0421 11/04/21 0054  NA 141 137  K 4.4 4.5  CL 105 102  CO2 28 26  GLUCOSE 178* 193*  BUN 26* 35*  CREATININE 1.43* 1.70*  CALCIUM 9.2 9.1  MG 2.8* 2.7*  PHOS 4.7* 3.4   Lipid Panel: No results for input(s): CHOL, TRIG, HDL, CHOLHDL, VLDL, LDLCALC in the last 168 hours. HgbA1c: No results for input(s): HGBA1C in the last 168 hours. Urine Drug Screen: No results for input(s): LABOPIA, COCAINSCRNUR, LABBENZ, AMPHETMU, THCU, LABBARB in the last 168 hours.  Alcohol Level No results for input(s): ETH in the last 168 hours.  IMAGING past 24 hours No results found.  PHYSICAL EXAM  Physical Exam  Constitutional: Appears well-developed and well-nourished.  Cardiovascular: Normal rate and regular rhythm.  Respiratory: Effort normal, non-labored breathing  Neuro: Mental Status: Patient is awake, alert, oriented to person, place, month, year, and  situation. Patient is able to give a clear and coherent history. No signs of aphasia or neglect Cranial Nerves: II: Visual Fields are full. Pupils are equal, round, and reactive to light.   III,IV, VI: EOMI without ptosis or diploplia.  V: Facial sensation is symmetric to temperature VII: Facial movement is symmetric resting and smiling VIII: Hearing is intact to voice X: Palate elevates symmetrically XI: Shoulder shrug is symmetric. XII: Tongue protrudes midline without atrophy or fasciculations.  Motor: Tone is normal. Bulk is normal.  LUE 4/5 RUE 5/5 LLE 3/5 RLE 5/5 Sensory: Sensation is symmetric to light touch and temperature in the arms and legs. No extinction to DSS present.  Cerebellar: FNF and HKS are intact bilaterally   ASSESSMENT/PLAN Devin White is a 66 y.o. male with history of liver transplant, HTN, BPH, GERD presenting with N/V/D presenting with left side weakness and headache. He was recently treated for a UTI with oral antibiotics and developed a C-Diff. Ongoing C-Diff treatment. Echo pending, LDL, A1C pending. ASA '325mg'$  ordered.   TIA CTA head & neck Mild for age atherosclerosis with no arterial stenosis. MRI  No acute intracranial process. No evidence of acute or subacute infarct. 2D Echo Pending LDL No results found for requested labs within last 26280 hours. HgbA1c 6.2 VTE prophylaxis - SCDs    Diet  Diet Heart Room service appropriate? Yes; Fluid consistency: Thin   No antithrombotic prior to admission, now on aspirin 325 mg daily.  Therapy recommendations:  Pending Disposition:  Pending  Hypertension Home meds:  Lasix, amlodipine, lisinopril Stable Permissive hypertension (OK if < 220/120) but gradually normalize in 5-7 days Long-term BP goal normotensive  Hyperlipidemia Home meds:  None LDL No results found for requested labs within last 26280 hours., goal < 70 Continue statin at discharge  Other Stroke Risk Factors Obesity, Body  mass index is 35.84 kg/m., BMI >/= 30 associated with increased stroke risk, recommend weight loss, diet and exercise as appropriate   Other Active Problems Hx liver transplant Tracrolimus, mycophenolate BPH Flomax  Hospital day # 8  Patient seen and examined by NP/APP with Devin White. Devin White to update note as needed.   Devin Ores, DNP, Devin White [facility] Pager: [phone]  ATTENDING ATTESTATION:  66 year old with transient left-sided weakness that is now resolved.  His MRI is negative for CVA and his event was likely TIA.  He also had pain in the left side of his head going down the left side of his body at the same time concerning for possible complicated migraine.  He is a transplant patient and is febrile and recently diagnosed with C. difficile on this admission.  Recommend echocardiogram to complete his work-up, which is still pending.  Dr. Reeves Forth evaluated pt independently, reviewed imaging, chart, labs. Discussed and formulated plan with the APP. Please see APP note above for details.   Total 36 minutes spent on counseling patient and coordinating care, writing notes and reviewing chart.   Devin Mcgillivray,Devin White    To contact Stroke Continuity provider, please refer to [email or web address] After hours, contact General Neurology

## 2021-11-05 ENCOUNTER — Inpatient Hospital Stay (HOSPITAL_COMMUNITY): Payer: Medicare Other

## 2021-11-05 DIAGNOSIS — R197 Diarrhea, unspecified: Secondary | ICD-10-CM | POA: Diagnosis not present

## 2021-11-05 DIAGNOSIS — R29818 Other symptoms and signs involving the nervous system: Secondary | ICD-10-CM | POA: Diagnosis not present

## 2021-11-05 DIAGNOSIS — A09 Infectious gastroenteritis and colitis, unspecified: Secondary | ICD-10-CM | POA: Diagnosis not present

## 2021-11-05 DIAGNOSIS — G43009 Migraine without aura, not intractable, without status migrainosus: Secondary | ICD-10-CM

## 2021-11-05 DIAGNOSIS — N179 Acute kidney failure, unspecified: Secondary | ICD-10-CM | POA: Diagnosis not present

## 2021-11-05 DIAGNOSIS — M1A9XX1 Chronic gout, unspecified, with tophus (tophi): Secondary | ICD-10-CM | POA: Diagnosis not present

## 2021-11-05 DIAGNOSIS — G459 Transient cerebral ischemic attack, unspecified: Secondary | ICD-10-CM | POA: Diagnosis not present

## 2021-11-05 LAB — ECHOCARDIOGRAM COMPLETE
AR max vel: 4.56 cm2
AV Area VTI: 4.48 cm2
AV Area mean vel: 4.21 cm2
AV Mean grad: 3 mmHg
AV Peak grad: 5 mmHg
Ao pk vel: 1.12 m/s
Area-P 1/2: 2.88 cm2
Height: 71 in
S' Lateral: 3 cm
Weight: 4112 oz

## 2021-11-05 LAB — LDL CHOLESTEROL, DIRECT: Direct LDL: 69.4 mg/dL (ref 0–99)

## 2021-11-05 LAB — CBC WITH DIFFERENTIAL/PLATELET
Abs Immature Granulocytes: 0.55 10*3/uL — ABNORMAL HIGH (ref 0.00–0.07)
Basophils Absolute: 0.1 10*3/uL (ref 0.0–0.1)
Basophils Relative: 1 %
Eosinophils Absolute: 0 10*3/uL (ref 0.0–0.5)
Eosinophils Relative: 0 %
HCT: 38.7 % — ABNORMAL LOW (ref 39.0–52.0)
Hemoglobin: 13.5 g/dL (ref 13.0–17.0)
Immature Granulocytes: 5 %
Lymphocytes Relative: 11 %
Lymphs Abs: 1.3 10*3/uL (ref 0.7–4.0)
MCH: 32.7 pg (ref 26.0–34.0)
MCHC: 34.9 g/dL (ref 30.0–36.0)
MCV: 93.7 fL (ref 80.0–100.0)
Monocytes Absolute: 1.1 10*3/uL — ABNORMAL HIGH (ref 0.1–1.0)
Monocytes Relative: 9 %
Neutro Abs: 8.4 10*3/uL — ABNORMAL HIGH (ref 1.7–7.7)
Neutrophils Relative %: 74 %
Platelets: 217 10*3/uL (ref 150–400)
RBC: 4.13 MIL/uL — ABNORMAL LOW (ref 4.22–5.81)
RDW: 13.4 % (ref 11.5–15.5)
WBC: 11.3 10*3/uL — ABNORMAL HIGH (ref 4.0–10.5)
nRBC: 0.2 % (ref 0.0–0.2)

## 2021-11-05 LAB — BASIC METABOLIC PANEL
Anion gap: 8 (ref 5–15)
BUN: 35 mg/dL — ABNORMAL HIGH (ref 8–23)
CO2: 29 mmol/L (ref 22–32)
Calcium: 9.1 mg/dL (ref 8.9–10.3)
Chloride: 101 mmol/L (ref 98–111)
Creatinine, Ser: 1.53 mg/dL — ABNORMAL HIGH (ref 0.61–1.24)
GFR, Estimated: 51 mL/min — ABNORMAL LOW (ref >60)
Glucose, Bld: 164 mg/dL — ABNORMAL HIGH (ref 70–99)
Potassium: 4.5 mmol/L (ref 3.5–5.1)
Sodium: 138 mmol/L (ref 135–145)

## 2021-11-05 LAB — PHOSPHORUS: Phosphorus: 3.6 mg/dL (ref 2.5–4.6)

## 2021-11-05 LAB — MAGNESIUM: Magnesium: 2.7 mg/dL — ABNORMAL HIGH (ref 1.7–2.4)

## 2021-11-05 MED ORDER — ROSUVASTATIN CALCIUM 5 MG PO TABS
5.0000 mg | ORAL_TABLET | Freq: Every evening | ORAL | Status: DC
  Administered 2021-11-05 – 2021-11-06 (×2): 5 mg via ORAL
  Filled 2021-11-05 (×2): qty 1

## 2021-11-05 MED ORDER — SUMATRIPTAN SUCCINATE 6 MG/0.5ML ~~LOC~~ SOLN
6.0000 mg | Freq: Once | SUBCUTANEOUS | Status: AC
  Administered 2021-11-05: 6 mg via SUBCUTANEOUS
  Filled 2021-11-05: qty 0.5

## 2021-11-05 MED ORDER — GLUCERNA SHAKE PO LIQD
237.0000 mL | Freq: Two times a day (BID) | ORAL | Status: DC
  Administered 2021-11-05 – 2021-11-07 (×5): 237 mL via ORAL

## 2021-11-05 MED ORDER — TOPIRAMATE 25 MG PO TABS
50.0000 mg | ORAL_TABLET | Freq: Two times a day (BID) | ORAL | Status: DC
  Administered 2021-11-05 – 2021-11-07 (×5): 50 mg via ORAL
  Filled 2021-11-05 (×5): qty 2

## 2021-11-05 NOTE — Progress Notes (Signed)
STROKE TEAM PROGRESS NOTE   INTERVAL HISTORY   Patient continues to have intermittent episodes of headache with left eye drooping as well as left posterior face as well as arm and leg paresthesias.  This last for hours.  He has had 3 such episodes so far since admission.  He is having about 6 episodes this morning.  Vital signs are stable.  Neurological exams suggest mild drooping of the left eye but no [place] quality of pupillary size.  Subjective hypoesthesia of the left hemibody but does not split the midline.  MRI scan of the brain is negative for acute stroke and CT angiograms are unremarkable.  Vitals:   11/04/21 1635 11/04/21 1939 11/05/21 0012 11/05/21 0804  BP: 140/75 127/87 132/82 (!) 146/101  Pulse: 85   81  Resp: '16 16 16 18  '$ Temp: 98.2 F (36.8 C) 97.9 F (36.6 C) 98 F (36.7 C) 98.3 F (36.8 C)  TempSrc: Oral Oral Oral Oral  SpO2: 100% 98% 98% 95%  Weight:      Height:       CBC:  Recent Labs  Lab 11/04/21 0054 11/05/21 0039  WBC 12.6* 11.3*  NEUTROABS 9.9* 8.4*  HGB 12.7* 13.5  HCT 37.2* 38.7*  MCV 94.9 93.7  PLT 234 161   Basic Metabolic Panel:  Recent Labs  Lab 11/04/21 0054 11/05/21 0039  NA 137 138  K 4.5 4.5  CL 102 101  CO2 26 29  GLUCOSE 193* 164*  BUN 35* 35*  CREATININE 1.70* 1.53*  CALCIUM 9.1 9.1  MG 2.7* 2.7*  PHOS 3.4 3.6   Lipid Panel: No results for input(s): CHOL, TRIG, HDL, CHOLHDL, VLDL, LDLCALC in the last 168 hours. HgbA1c: No results for input(s): HGBA1C in the last 168 hours. Urine Drug Screen: No results for input(s): LABOPIA, COCAINSCRNUR, LABBENZ, AMPHETMU, THCU, LABBARB in the last 168 hours.  Alcohol Level No results for input(s): ETH in the last 168 hours.  IMAGING past 24 hours No results found.  PHYSICAL EXAM  Physical Exam  Constitutional: Mildly obese middle-aged Caucasian male.  Cardiovascular: Normal rate and regular rhythm.  Respiratory: Effort normal, non-labored breathing  Neuro: Mental Status: Patient  is awake, alert, oriented to person, place, month, year, and situation. Patient is able to give a clear and coherent history. No signs of aphasia or neglect Cranial Nerves: II: Visual Fields are full. Pupils are equal, round, and reactive to light.   III,IV, VI: EOMI without ptosis or diploplia.  Mild drooping of the left eyelid but likely due to photophobia. V: Facial sensation is symmetric to temperature VII: Facial movement is symmetric resting and smiling VIII: Hearing is intact to voice X: Palate elevates symmetrically XI: Shoulder shrug is symmetric. XII: Tongue protrudes midline without atrophy or fasciculations.  Motor: Tone is normal. Bulk is normal.  LUE 4/5 RUE 5/5 LLE 3/5 RLE 5/5 Sensory: Subjective diminished touch pinprick sensation of the left hemibody including forehead but does not split the midline..  Cerebellar: FNF and HKS are intact bilaterally   ASSESSMENT/PLAN [name] is a 66 y.o. male with history of liver transplant, HTN, BPH, GERD presenting with N/V/D presenting with left side weakness and headache. He was recently treated for a UTI with oral antibiotics and developed a C-Diff. Ongoing C-Diff treatment. Echo pending, LDL, A1C pending. ASA '325mg'$  ordered.   Doubt right brain TIA likely complicated migraine .  Or trigeminal facial autonomic cephalgia CTA head & neck Mild for age atherosclerosis with no  arterial stenosis. MRI  No acute intracranial process. No evidence of acute or subacute infarct. 2D Echo Pending LDL No results found for requested labs within last 26280 hours. HgbA1c 6.2 VTE prophylaxis - SCDs    Diet   Diet Heart Room service appropriate? Yes; Fluid consistency: Thin   No antithrombotic prior to admission, now on aspirin 325 mg daily.  Therapy recommendations:  Pending Disposition:  Pending  Hypertension Home meds:  Lasix, amlodipine, lisinopril Stable Permissive hypertension (OK if < 220/120) but gradually normalize in  5-7 days Long-term BP goal normotensive  Hyperlipidemia Home meds:  None LDL No results found for requested labs within last 26280 hours., goal < 70 Continue statin at discharge  Other Stroke Risk Factors Obesity, Body mass index is 35.84 kg/m., BMI >/= 30 associated with increased stroke risk, recommend weight loss, diet and exercise as appropriate   Other Active Problems Hx liver transplant Tracrolimus, mycophenolate BPH Flomax  Hospital day # 9   I have personally obtained history,examined this patient, reviewed notes, independently viewed imaging studies, participated in medical decision making and plan of care.ROS completed by me personally and pertinent positives fully documented  I have made any additions or clarifications directly to the above note.  Patient presented with recurrent stereotypical episodes of along with left face and body paresthesias with some weakness doubt this represents TIA but possibly complicated migraine versus autonomic cephalgia.  Recommend a trial of Imitrex as well as Topamax.  Check echo results.  Continue aspirin.  Long discussion patient about his symptoms and differential diagnosis and evaluation and treatment plan and answered questions.  Discussed with Dr. Karleen Hampshire.  Greater than 50% time during this 50-minute visit was spent on counseling and coordination of care about TIA versus stroke and answering questions.  Antony Contras, MD Medical Director [place] Pager: [phone] 11/05/2021 11:41 AM    To contact Stroke Continuity provider, please refer to [email or web address] After hours, contact General Neurology

## 2021-11-05 NOTE — Progress Notes (Signed)
Nutrition Follow-up  DOCUMENTATION CODES:  Obesity unspecified  INTERVENTION:  Continue current diet as ordered Adjust ensure to Glucerna Shake po TID, each supplement provides 220 kcal and 10 grams of protein Continue MVI with minerals  NUTRITION DIAGNOSIS:  Increased nutrient needs related to acute illness, diarrhea as evidenced by estimated needs. - remains applicable  GOAL:  Patient will meet greater than or equal to 90% of their needs - progressing, supplements in place  MONITOR:  PO intake, Supplement acceptance, Diet advancement, Labs, Weight trends, I & O's  REASON FOR ASSESSMENT:  Malnutrition Screening Tool    ASSESSMENT:  Pt admitted with weakness and >30 episodes of diarrhea. PMH significant for liver transplant, HTN, BPH and GERD. Pt recently d/[facility] from [facility] for recurrent UTI.  5/20 - transferred to [facility] from [facility] for neurological exam  Pt resting in bed at the time of assessment. Breakfast tray at bedside noted at bedside 100% consumed. Pt reports he is doing well with his meals, consuming everything that he orders at each meal and is not having difficulty selecting items he likes. Also reports drinking the ensure. Intake appears improved. Will continue diet as ordered and adjust ensure enlive to glucerna as CBGs have been elevated at times. Will also request new measured weight.   Average Meal Intake: 5/12-5/22: 73% intake x 18 recorded meals  Nutritionally Relevant Medications: Scheduled Meds:  alum & mag hydroxide-simeth  30 mL Oral Q6H   vitamin C  500 mg Oral Daily   Ensure Enlive  237 mL Oral BID BM   multivitamin with minerals  1 tablet Oral Daily   mycophenolate  250 mg Oral QPM   predniSONE  40 mg Oral Q breakfast   vancomycin  125 mg Oral QID   vitamin A  10,000 Units Oral Daily   zinc sulfate  220 mg Oral Daily   Continuous Infusions:  sodium chloride 75 mL/hr at 11/04/21 1236   PRN Meds: prochlorperazine  Labs Reviewed: BUN  35, creatinine 1.53 Mg 2.7 HgbA1c 6.2% (5/12)  Vitamin/Mineral Labs Vitamin A 24.9 Vitamin C .6 Zinc 50  NUTRITION - FOCUSED PHYSICAL EXAM: Flowsheet Row Most Recent Value  Orbital Region No depletion  Upper Arm Region No depletion  Thoracic and Lumbar Region No depletion  Buccal Region No depletion  Temple Region No depletion  Clavicle Bone Region No depletion  Clavicle and Acromion Bone Region No depletion  Scapular Bone Region No depletion  Dorsal Hand No depletion  Patellar Region No depletion  Anterior Thigh Region No depletion  Posterior Calf Region No depletion  Edema (RD Assessment) None  Hair Reviewed  Eyes Reviewed  Mouth Reviewed  Skin Reviewed  Nails Reviewed   Diet Order:   Diet Order             Diet Heart Room service appropriate? Yes; Fluid consistency: Thin  Diet effective now                   EDUCATION NEEDS:  Education needs have been addressed  Skin:  Skin Assessment: Reviewed RN Assessment  Last BM:  5/21 - type 6  Height:  Ht Readings from Last 1 Encounters:  10/26/21 '5\' 11"'$  (1.803 m)    Weight:  Wt Readings from Last 1 Encounters:  10/26/21 116.6 kg    Ideal Body Weight:  78.2 kg  BMI:  Body mass index is 35.84 kg/m.  Estimated Nutritional Needs:  Kcal:  2100-2300 Protein:  105-120g Fluid:  >/=  2.1L    Ranell Patrick, RD, LDN Clinical Dietitian RD pager # available in [facility]  After hours/weekend pager # available in [facility]

## 2021-11-05 NOTE — Progress Notes (Signed)
EEG complete - results pending 

## 2021-11-05 NOTE — Progress Notes (Signed)
  Echocardiogram 2D Echocardiogram has been performed.  Devin White 11/05/2021, 1:46 PM

## 2021-11-05 NOTE — Procedures (Signed)
Patient Name: Devin White  MRN: [id number]  Epilepsy Attending: Lora Havens  Referring Physician/Provider: Katy Apo, NP Date: 11/05/2021 Duration: 28.06 mins  Patient history: 66yo M patient presented with recurrent stereotypical episodes of along with left face and body paresthesias with some weakness. EEG to evaluate for seizure  Level of alertness: Awake, asleep  AEDs during EEG study: None  Technical aspects: This EEG study was done with scalp electrodes positioned according to the 10-20 International system of electrode placement. Electrical activity was acquired at a sampling rate of '500Hz'$  and reviewed with a high frequency filter of '70Hz'$  and a low frequency filter of '1Hz'$ . EEG data were recorded continuously and digitally stored.   Description: The posterior dominant rhythm consists of 8-9 Hz activity of moderate voltage (25-35 uV) seen predominantly in posterior head regions, symmetric and reactive to eye opening and eye closing. Sleep was characterized by vertex waves, sleep spindles (12 to 14 Hz), maximal frontocentral region. Hyperventilation and photic stimulation were not performed.     Of note, EEG was technically difficult due to significant ekg artifact.  IMPRESSION: This study is within normal limits. No seizures or epileptiform discharges were seen throughout the recording.  [name]

## 2021-11-05 NOTE — Progress Notes (Signed)
[facility]                                                                               Devin White, is a 66 y.o. male, DOB - 1956-02-02, GMW:[id number] Admit date - 10/26/2021    Outpatient Primary MD for the patient is Devin White I, MD  LOS - 9  days    Brief summary   66yo with hx liver transplant, HTN, BPH, GERD presenting with N/V/D. He was recently discharged from [facility] for recurrent UTI. He was finishing a course of abx and felt nauseous each time he took the medication (ceftin). He began having diarrhea as well. He reports over 30 episodes of diarrhea since initiating the oral antibiotic. 2 days ago patient had left sided weakness with left sided headache and he was transferred to [facility] for evaluation of stroke. Neurology on board.   Assessment & Plan    Assessment and Plan:   C diff colitis: On oral vancomycin 125 mg QID to complete the course.  Diarrhea is improving. Formed stool.    Bilateral Renal cysts:  -recommend outpatient follow up with Urology.    TIA:  With left sided residual paraesthesias. Differential include complicated migraine and Autonomic cephalgia CTA of the head and neck does not show any significant stenosis.  MRI negative for acute or sub acute infarct.  Echocardiogram is pending.  Hemoglobin A1c is 6.2% LDL is 69 Started the patient on aspirin 325 mg daily, neurology added a trial of topomax and imitrex .  EEG done today as he had another episode of numbness on the left side of the face. No syncope .  Therapy evaluations recommends no PT follow up.      Hypertension;  Permissive hypertension. Currently on amlodipine and prn hydralazine.    Hyperlipidemia:  LDL is 69 Started him on crestor 5 mg daily.    Body mass index is 35.84 kg/m. OBESITY;  Recommend outpatient follow up with PCP for weight loss.     H/o Liver Transplant:  On tacrolimus and mycophenolate.    Leukocytosis:  possibly from c diff  diarrhea and colitis.  Improving.    Mild normocytic anemia:  Continue to monitor.     Gouty flare up  On prednisone,  Colchicine held for AKI.     AKI:  - Baseline creatinine around 1.4.  - creatinine increased to 1.7 Start gentle hydration, and stop the colchicine.  Repeat creatinine today is 1.5. recommend another 24 hours of gentle hydration and repeat renal parameters in am    Malnutrition Type:  Nutrition Problem: Increased nutrient needs Etiology: acute illness, diarrhea   Malnutrition Characteristics:  Signs/Symptoms: estimated needs   Nutrition Interventions:  Interventions: Ensure Enlive (each supplement provides 350kcal and 20 grams of protein), MVI, Liberalize Diet  Estimated body mass index is 35.84 kg/m as calculated from the following:   Height as of this encounter: '5\' 11"'$  (1.803 m).   Weight as of this encounter: 116.6 kg.  Code Status: full code DVT Prophylaxis:  SCDs Start: 10/26/21 1338   Level of Care: Level of care: Telemetry Medical Family Communication: none at bedside.   Disposition Plan:  Remains inpatient appropriate:    Procedures:  none  Consultants:   neurology  Antimicrobials:   Anti-infectives (From admission, onward)    Start     Dose/Rate Route Frequency Ordered Stop   10/26/21 1915  vancomycin (VANCOCIN) capsule 125 mg        125 mg Oral 4 times daily 10/26/21 1827 11/05/21 1659        Medications  Scheduled Meds:  alum & mag hydroxide-simeth  30 mL Oral Q6H   amLODipine  10 mg Oral Daily   vitamin C  500 mg Oral Daily   aspirin  325 mg Oral Daily   feeding supplement (GLUCERNA SHAKE)  237 mL Oral BID BM   multivitamin with minerals  1 tablet Oral Daily   mycophenolate  500 mg Oral q morning   And   mycophenolate  250 mg Oral QPM   predniSONE  40 mg Oral Q breakfast   SUMAtriptan  6 mg Subcutaneous Once   tacrolimus  0.5 mg Oral q morning   And   tacrolimus  1 mg Oral QHS   tamsulosin  0.4 mg  Oral BID   topiramate  50 mg Oral BID   vancomycin  125 mg Oral QID   vitamin A  10,000 Units Oral Daily   zinc sulfate  220 mg Oral Daily   Continuous Infusions:  sodium chloride 75 mL/hr at 11/04/21 1236   PRN Meds:.acetaminophen, hydrALAZINE, labetalol, oxyCODONE, prochlorperazine    Subjective:   Devin White was seen and examined today.  Another episode of numbness on the left side of face. No diarrhea today.   Objective:   Vitals:   11/04/21 1635 11/04/21 1939 11/05/21 0012 11/05/21 0804  BP: 140/75 127/87 132/82 (!) 146/101  Pulse: 85   81  Resp: '16 16 16 18  '$ Temp: 98.2 F (36.8 C) 97.9 F (36.6 C) 98 F (36.7 C) 98.3 F (36.8 C)  TempSrc: Oral Oral Oral Oral  SpO2: 100% 98% 98% 95%  Weight:      Height:        Intake/Output Summary (Last 24 hours) at 11/05/2021 1145 Last data filed at 11/05/2021 0900 Gross per 24 hour  Intake 2233.25 ml  Output 2200 ml  Net 33.25 ml    Filed Weights   10/26/21 0830  Weight: 116.6 kg     Exam General exam: Appears calm and comfortable  Respiratory system: Clear to auscultation. Respiratory effort normal. Cardiovascular system: S1 & S2 heard, RRR. No JVD,  No pedal edema. Gastrointestinal system: Abdomen is nondistended, soft and nontender. Normal bowel sounds heard. Central nervous system: Alert and oriented. No focal neurological deficits. Extremities: Symmetric 5 x 5 power. Skin: No rashes, lesions or ulcers Psychiatry:  Mood & affect appropriate.     Data Reviewed:  I have personally reviewed following labs and imaging studies   CBC Lab Results  Component Value Date   WBC 11.3 (H) 11/05/2021   RBC 4.13 (L) 11/05/2021   HGB 13.5 11/05/2021   HCT 38.7 (L) 11/05/2021   MCV 93.7 11/05/2021   MCH 32.7 11/05/2021   PLT 217 11/05/2021   MCHC 34.9 11/05/2021   RDW 13.4 11/05/2021   LYMPHSABS 1.3 11/05/2021   MONOABS 1.1 (H) 11/05/2021   EOSABS 0.0 11/05/2021   BASOSABS 0.1 11/05/2021     Last  metabolic panel Lab Results  Component Value Date   NA 138 11/05/2021   K 4.5 11/05/2021   CL 101 11/05/2021   CO2 29  11/05/2021   BUN 35 (H) 11/05/2021   CREATININE 1.53 (H) 11/05/2021   GLUCOSE 164 (H) 11/05/2021   GFRNONAA 51 (L) 11/05/2021   GFRAA 48 (L) 08/11/2019   CALCIUM 9.1 11/05/2021   PHOS 3.6 11/05/2021   PROT 5.7 (L) 11/04/2021   ALBUMIN 3.2 (L) 11/04/2021   BILITOT 0.3 11/04/2021   ALKPHOS 50 11/04/2021   AST 22 11/04/2021   ALT 53 (H) 11/04/2021   ANIONGAP 8 11/05/2021    CBG (last 3)  No results for input(s): GLUCAP in the last 72 hours.     Coagulation Profile: No results for input(s): INR, PROTIME in the last 168 hours.   Radiology Studies: No results found.     Hosie Poisson M.D. [facility] 11/05/2021, 11:45 AM  Available via Epic secure chat 7am-7pm After 7 pm, please refer to night coverage provider listed on amion.

## 2021-11-06 DIAGNOSIS — A09 Infectious gastroenteritis and colitis, unspecified: Secondary | ICD-10-CM | POA: Diagnosis not present

## 2021-11-06 DIAGNOSIS — N179 Acute kidney failure, unspecified: Secondary | ICD-10-CM | POA: Diagnosis not present

## 2021-11-06 DIAGNOSIS — N400 Enlarged prostate without lower urinary tract symptoms: Secondary | ICD-10-CM | POA: Diagnosis not present

## 2021-11-06 DIAGNOSIS — E669 Obesity, unspecified: Secondary | ICD-10-CM | POA: Diagnosis not present

## 2021-11-06 LAB — CREATININE, SERUM
Creatinine, Ser: 1.53 mg/dL — ABNORMAL HIGH (ref 0.61–1.24)
GFR, Estimated: 51 mL/min — ABNORMAL LOW (ref >60)

## 2021-11-06 LAB — CBC WITH DIFFERENTIAL/PLATELET
Abs Immature Granulocytes: 0 10*3/uL (ref 0.00–0.07)
Basophils Absolute: 0 10*3/uL (ref 0.0–0.1)
Basophils Relative: 0 %
Eosinophils Absolute: 0 10*3/uL (ref 0.0–0.5)
Eosinophils Relative: 0 %
HCT: 37.6 % — ABNORMAL LOW (ref 39.0–52.0)
Hemoglobin: 13.3 g/dL (ref 13.0–17.0)
Lymphocytes Relative: 6 %
Lymphs Abs: 0.6 10*3/uL — ABNORMAL LOW (ref 0.7–4.0)
MCH: 33.1 pg (ref 26.0–34.0)
MCHC: 35.4 g/dL (ref 30.0–36.0)
MCV: 93.5 fL (ref 80.0–100.0)
Monocytes Absolute: 0.6 10*3/uL (ref 0.1–1.0)
Monocytes Relative: 6 %
Neutro Abs: 9 10*3/uL — ABNORMAL HIGH (ref 1.7–7.7)
Neutrophils Relative %: 88 %
Platelets: 205 10*3/uL (ref 150–400)
RBC: 4.02 MIL/uL — ABNORMAL LOW (ref 4.22–5.81)
RDW: 13.5 % (ref 11.5–15.5)
WBC: 10.2 10*3/uL (ref 4.0–10.5)
nRBC: 0.2 % (ref 0.0–0.2)

## 2021-11-06 LAB — PHOSPHORUS: Phosphorus: 3.3 mg/dL (ref 2.5–4.6)

## 2021-11-06 LAB — MAGNESIUM: Magnesium: 2.8 mg/dL — ABNORMAL HIGH (ref 1.7–2.4)

## 2021-11-06 MED ORDER — VITAMIN A 3 MG (10000 UNIT) PO CAPS
10000.0000 [IU] | ORAL_CAPSULE | Freq: Every day | ORAL | Status: DC
Start: 2021-11-07 — End: 2022-11-07

## 2021-11-06 MED ORDER — SUMATRIPTAN SUCCINATE 6 MG/0.5ML ~~LOC~~ SOLN
6.0000 mg | Freq: Once | SUBCUTANEOUS | Status: AC
  Administered 2021-11-06: 6 mg via SUBCUTANEOUS
  Filled 2021-11-06: qty 0.5

## 2021-11-06 MED ORDER — ASCORBIC ACID 500 MG PO TABS: 500.0000 mg | ORAL_TABLET | Freq: Every day | ORAL | Status: DC

## 2021-11-06 MED ORDER — GLUCERNA SHAKE PO LIQD
237.0000 mL | Freq: Two times a day (BID) | ORAL | 1 refills | Status: AC
Start: 2021-11-07 — End: 2022-01-06

## 2021-11-06 MED ORDER — COLCHICINE 0.6 MG PO TABS: 0.6000 mg | ORAL_TABLET | Freq: Every day | ORAL | 3 refills | Status: DC | PRN

## 2021-11-06 MED ORDER — TOPIRAMATE 50 MG PO TABS: 50.0000 mg | ORAL_TABLET | Freq: Two times a day (BID) | ORAL | 1 refills | Status: DC

## 2021-11-06 MED ORDER — ROSUVASTATIN CALCIUM 5 MG PO TABS: 5.0000 mg | ORAL_TABLET | Freq: Every evening | ORAL | 0 refills | Status: DC

## 2021-11-06 MED ORDER — ZINC SULFATE 220 (50 ZN) MG PO CAPS: 220.0000 mg | ORAL_CAPSULE | Freq: Every day | ORAL | Status: DC

## 2021-11-06 MED ORDER — ASPIRIN 325 MG PO TABS: 325.0000 mg | ORAL_TABLET | Freq: Every day | ORAL | 3 refills | Status: DC

## 2021-11-06 MED ORDER — SUMATRIPTAN SUCCINATE 100 MG PO TABS: 100.0000 mg | ORAL_TABLET | Freq: Once | ORAL | 0 refills | Status: DC | PRN

## 2021-11-06 MED ORDER — ADULT MULTIVITAMIN W/MINERALS CH: 1.0000 | ORAL_TABLET | Freq: Every day | ORAL | Status: DC

## 2021-11-06 NOTE — Progress Notes (Signed)
STROKE TEAM PROGRESS NOTE   INTERVAL HISTORY   Patient states that his headache improved significantly from 7/10 to 2/10 within 20 minutes of Imitrex injection.  His left eye photophobia and goopiness also improved.  He still has some residual numbness.  This morning the headache seems to be running back but is not as severe.  He has tolerated Topamax well without any side effects.  Vitals:   11/05/21 1556 11/05/21 1943 11/06/21 0301 11/06/21 0927  BP: 137/79 138/86 (!) 141/101 (!) 134/99  Pulse: 83 82  73  Resp: '18 17 18 20  '$ Temp: 98.3 F (36.8 C) 97.7 F (36.5 C) 97.8 F (36.6 C) 97.7 F (36.5 C)  TempSrc: Oral  Oral Oral  SpO2: 100% 97% 97% 98%  Weight:      Height:       CBC:  Recent Labs  Lab 11/05/21 0039 11/06/21 0342  WBC 11.3* 10.2  NEUTROABS 8.4* 9.0*  HGB 13.5 13.3  HCT 38.7* 37.6*  MCV 93.7 93.5  PLT 217 149   Basic Metabolic Panel:  Recent Labs  Lab 11/04/21 0054 11/05/21 0039 11/06/21 0342  NA 137 138  --   K 4.5 4.5  --   CL 102 101  --   CO2 26 29  --   GLUCOSE 193* 164*  --   BUN 35* 35*  --   CREATININE 1.70* 1.53*  --   CALCIUM 9.1 9.1  --   MG 2.7* 2.7* 2.8*  PHOS 3.4 3.6 3.3   Lipid Panel: No results for input(s): CHOL, TRIG, HDL, CHOLHDL, VLDL, LDLCALC in the last 168 hours. HgbA1c: No results for input(s): HGBA1C in the last 168 hours. Urine Drug Screen: No results for input(s): LABOPIA, COCAINSCRNUR, LABBENZ, AMPHETMU, THCU, LABBARB in the last 168 hours.  Alcohol Level No results for input(s): ETH in the last 168 hours.  IMAGING past 24 hours EEG adult  Result Date: 11/05/2021 Lora Havens, MD     11/05/2021  4:19 PM Patient Name: KIN GALBRAITH MRN: [id number] Epilepsy Attending: Lora Havens Referring Physician/Provider: Katy Apo, NP Date: 11/05/2021 Duration: 28.06 mins Patient history: 66yo M patient presented with recurrent stereotypical episodes of along with left face and body paresthesias with some  weakness. EEG to evaluate for seizure Level of alertness: Awake, asleep AEDs during EEG study: None Technical aspects: This EEG study was done with scalp electrodes positioned according to the 10-20 International system of electrode placement. Electrical activity was acquired at a sampling rate of '500Hz'$  and reviewed with a high frequency filter of '70Hz'$  and a low frequency filter of '1Hz'$ . EEG data were recorded continuously and digitally stored. Description: The posterior dominant rhythm consists of 8-9 Hz activity of moderate voltage (25-35 uV) seen predominantly in posterior head regions, symmetric and reactive to eye opening and eye closing. Sleep was characterized by vertex waves, sleep spindles (12 to 14 Hz), maximal frontocentral region. Hyperventilation and photic stimulation were not performed.   Of note, EEG was technically difficult due to significant ekg artifact. IMPRESSION: This study is within normal limits. No seizures or epileptiform discharges were seen throughout the recording. Lora Havens   ECHOCARDIOGRAM COMPLETE  Result Date: 11/05/2021    ECHOCARDIOGRAM REPORT   Patient Name:   JONN CHAIKIN Date of Exam: 11/05/2021 Medical Rec #:  [id number]        Height:       71.0 in Accession #:    [id number]  Weight:       257.0 lb Date of Birth:  1955-12-21        BSA:          2.346 m Patient Age:    77 years         BP:           146/108 mmHg Patient Gender: M                HR:           81 bpm. Exam Location:  Inpatient Procedure: 2D Echo, Cardiac Doppler and Color Doppler Indications:    TIA  History:        Patient has prior history of Echocardiogram examinations, most                 recent 12/15/2015. Risk Factors:Hypertension. Liver transplant.                 GERD. CKD.  Sonographer:    Poulter Lefort RDCS (AE) Referring Phys: [id number] [facility]  Sonographer Comments: Patient is morbidly obese. Image acquisition challenging due to patient body habitus and bright ambient light in [address]  rooms. IMPRESSIONS  1. Left ventricular ejection fraction, by estimation, is 65 to 70%. The left ventricle has normal function. The left ventricle has no regional wall motion abnormalities. There is moderate hypertrophy of basal-septum. The rest of the LV segments demonstrate mild left ventricular hypertrophy. Left ventricular diastolic parameters are consistent with Grade I diastolic dysfunction (impaired relaxation).  2. Right ventricular systolic function is normal. The right ventricular size is normal.  3. The mitral valve is normal in structure. Trivial mitral valve regurgitation.  4. The aortic valve is tricuspid. Aortic valve regurgitation is not visualized. No aortic stenosis is present.  5. Aortic dilatation noted. There is borderline dilatation of the aortic root, measuring 38 mm. Comparison(s): Compared to prior echo report in 2017, there is no significant change. Conclusion(s)/Recommendation(s): No intracardiac source of embolism detected on this transthoracic study. Consider a transesophageal echocardiogram to exclude cardiac source of embolism if clinically indicated. FINDINGS  Left Ventricle: Left ventricular ejection fraction, by estimation, is 65 to 70%. The left ventricle has normal function. The left ventricle has no regional wall motion abnormalities. The left ventricular internal cavity size was normal in size. There is  moderate hypertrophy of basal-septum. The rest of the LV segments demonstrate mild left ventricular hypertrophy. Left ventricular diastolic parameters are consistent with Grade I diastolic dysfunction (impaired relaxation). Right Ventricle: The right ventricular size is normal. No increase in right ventricular wall thickness. Right ventricular systolic function is normal. Left Atrium: Left atrial size was normal in size. Right Atrium: Right atrial size was normal in size. Pericardium: There is no evidence of pericardial effusion. Mitral Valve: The mitral valve is normal in  structure. Trivial mitral valve regurgitation. Tricuspid Valve: The tricuspid valve is normal in structure. Tricuspid valve regurgitation is trivial. Aortic Valve: The aortic valve is tricuspid. Aortic valve regurgitation is not visualized. No aortic stenosis is present. Aortic valve mean gradient measures 3.0 mmHg. Aortic valve peak gradient measures 5.0 mmHg. Aortic valve area, by VTI measures 4.48 cm. Pulmonic Valve: The pulmonic valve was normal in structure. Pulmonic valve regurgitation is trivial. Aorta: Aortic dilatation noted. There is borderline dilatation of the aortic root, measuring 38 mm. IAS/Shunts: The atrial septum is grossly normal.  LEFT VENTRICLE PLAX 2D LVIDd:         5.10 cm   Diastology LVIDs:  3.00 cm   LV e' medial:    4.57 cm/s LV PW:         1.20 cm   LV E/e' medial:  11.2 LV IVS:        1.50 cm   LV e' lateral:   8.59 cm/s LVOT diam:     2.40 cm   LV E/e' lateral: 6.0 LV SV:         99 LV SV Index:   42 LVOT Area:     4.52 cm  RIGHT VENTRICLE RV Basal diam:  2.70 cm RV S prime:     15.70 cm/s LEFT ATRIUM             Index        RIGHT ATRIUM          Index LA diam:        4.00 cm 1.71 cm/m   RA Area:     9.01 cm LA Vol (A2C):   74.9 ml 31.93 ml/m  RA Volume:   16.70 ml 7.12 ml/m LA Vol (A4C):   56.2 ml 23.96 ml/m LA Biplane Vol: 67.9 ml 28.95 ml/m  AORTIC VALVE AV Area (Vmax):    4.56 cm AV Area (Vmean):   4.21 cm AV Area (VTI):     4.48 cm AV Vmax:           112.00 cm/s AV Vmean:          77.600 cm/s AV VTI:            0.220 m AV Peak Grad:      5.0 mmHg AV Mean Grad:      3.0 mmHg LVOT Vmax:         113.00 cm/s LVOT Vmean:        72.200 cm/s LVOT VTI:          0.218 m LVOT/AV VTI ratio: 0.99  AORTA Ao Root diam: 3.80 cm Ao Asc diam:  3.60 cm MITRAL VALVE MV Area (PHT): 2.88 cm    SHUNTS MV Decel Time: 263 msec    Systemic VTI:  0.22 m MV E velocity: 51.40 cm/s  Systemic Diam: 2.40 cm MV A velocity: 66.80 cm/s MV E/A ratio:  0.77 Gwyndolyn Kaufman MD Electronically  signed by Gwyndolyn Kaufman MD Signature Date/Time: 11/05/2021/3:07:49 PM    Final     PHYSICAL EXAM  Physical Exam  Constitutional: Mildly obese middle-aged Caucasian male.  Cardiovascular: Normal rate and regular rhythm.  Respiratory: Effort normal, non-labored breathing  Neuro: Mental Status: Patient is awake, alert, oriented to person, place, month, year, and situation. Patient is able to give a clear and coherent history. No signs of aphasia or neglect Cranial Nerves: II: Visual Fields are full. Pupils are equal, round, and reactive to light.   III,IV, VI: EOMI without ptosis or diploplia.  Minimal drooping of the left eyelid   V: Facial sensation is symmetric to temperature VII: Facial movement is symmetric resting and smiling VIII: Hearing is intact to voice X: Palate elevates symmetrically XI: Shoulder shrug is symmetric. XII: Tongue protrudes midline without atrophy or fasciculations.  Motor: Tone is normal. Bulk is normal.  LUE 4/5 RUE 5/5 LLE 3/5 RLE 5/5 Sensory: Subjective diminished touch pinprick sensation of the left hemibody including forehead but does not split the midline..  Cerebellar: FNF and HKS are intact bilaterally   ASSESSMENT/PLAN Mr. BENTLY WYSS is a 66 y.o. male with history of liver transplant, HTN, BPH, GERD presenting with  N/V/D presenting with left side weakness and headache. He was recently treated for a UTI with oral antibiotics and developed a C-Diff. Ongoing C-Diff treatment.    Doubt right brain TIA likely complicated migraine .  Or trigeminal facial autonomic cephalgia CTA head & neck Mild for age atherosclerosis with no arterial stenosis. MRI  No acute intracranial process. No evidence of acute or subacute infarct. 2D Echo ejection fraction 65 to 70%  LDL 69.4 HgbA1c 6.2 VTE prophylaxis - SCDs    Diet   Diet Heart Room service appropriate? Yes; Fluid consistency: Thin   No antithrombotic prior to admission, now on aspirin 325 mg  daily.  Therapy recommendations:  Pending Disposition:  Pending  Hypertension Home meds:  Lasix, amlodipine, lisinopril Stable Permissive hypertension (OK if < 220/120) but gradually normalize in 5-7 days Long-term BP goal normotensive  Hyperlipidemia Home meds:  None LDL No results found for requested labs within last 26280 hours., goal < 70 Continue statin at discharge  Other Stroke Risk Factors Obesity, Body mass index is 35.84 kg/m., BMI >/= 30 associated with increased stroke risk, recommend weight loss, diet and exercise as appropriate   Other Active Problems Hx liver transplant Tracrolimus, mycophenolate BPH Flomax  Hospital day # 10  Patient presented with recurrent stereotypical episodes of along with left face and body paresthesias with some weakness doubt this represents TIA but possibly complicated migraine versus autonomic cephalgia more likely given response to Imitrex.  Recommend a repeat dose of Imitrex as well as continue Topamax.     Continue aspirin.  Patient may be discharged home.  Follow-up as an outpatient versus clinic in 2 months.  Long discussion patient about his symptoms and differential diagnosis and evaluation and treatment plan and answered questions.  Discussed with Dr. Karleen Hampshire.  Greater than 50% time during this 35-minute visit was spent on counseling and coordination of care about TIA versus stroke and answering questions.  Antony Contras, MD Medical Director [place] Pager: [phone] 11/06/2021 11:07 AM    To contact Stroke Continuity provider, please refer to [email or web address] After hours, contact General Neurology

## 2021-11-06 NOTE — TOC Initial Note (Signed)
Transition of Care [facility]) - Initial/Assessment Note    Patient Details  Name: Devin White MRN: [id number] Date of Birth: [birth year]  Transition of Care [facility]) CM/SW Contact:    Pollie Friar, RN Phone Number: 11/06/2021, 11:11 AM  Clinical Narrative:                 Patient is from home with his daughter. He is a liver transplant patient and follows with [facility] for this and recurrent infusions for gout.  Pt 's daughter works during the daytime but his 92 yo granddaughter is there doing home schooling.  Pt was driving self to appointments. He feels this may be more limited. He states [facility] will provide transport for his transplant clinic visits and he will check into this service. He states he has other family that can assist.  Pt manages his own medications at home and denies any issues.  Daughter is able to provide transport home at d/c.   Expected Discharge Plan: Home/Self Care Barriers to Discharge: Continued Medical Work up   Patient Goals and CMS Choice        Expected Discharge Plan and Services Expected Discharge Plan: Home/Self Care   Discharge Planning Services: CM Consult   Living arrangements for the past 2 months: Apartment                                      Prior Living Arrangements/Services Living arrangements for the past 2 months: Apartment Lives with:: Adult Children Patient language and need for interpreter reviewed:: Yes Do you feel safe going back to the place where you live?: Yes          Current home services: DME (walker) Criminal Activity/Legal Involvement Pertinent to Current Situation/Hospitalization: No - Comment as needed  Activities of Daily Living Home Assistive Devices/Equipment: None ADL Screening (condition at time of admission) Patient's cognitive ability adequate to safely complete daily activities?: Yes Is the patient deaf or have difficulty hearing?: No Does the patient have difficulty seeing, even when wearing  glasses/contacts?: No Does the patient have difficulty concentrating, remembering, or making decisions?: No Patient able to express need for assistance with ADLs?: Yes Does the patient have difficulty dressing or bathing?: Yes Independently performs ADLs?: Yes (appropriate for developmental age) Does the patient have difficulty walking or climbing stairs?: Yes (SOB w/ exartion) Weakness of Legs: Both Weakness of Arms/Hands: Both  Permission Sought/Granted                  Emotional Assessment Appearance:: Appears stated age Attitude/Demeanor/Rapport: Engaged Affect (typically observed): Accepting Orientation: : Oriented to Self, Oriented to Place, Oriented to  Time, Oriented to Situation   Psych Involvement: No (comment)  Admission diagnosis:  Diarrhea of presumed infectious origin [R19.7] Diarrhea [R19.7] AKI (acute kidney injury) ([place]) [N17.9] H/O liver transplant ([place]) [Z94.4] Patient Active Problem List   Diagnosis Date Noted   Obesity (BMI 30-39.9) 10/27/2021   Diarrhea 10/26/2021   Nausea and vomiting 10/26/2021   Stage 3a chronic kidney disease (CKD) ([place]) 10/26/2021   AKI (acute kidney injury) ([place]) 10/26/2021   Hyperbilirubinemia 10/26/2021   Hyperglycemia 10/26/2021   Chronic diastolic heart failure ([place]) 09/05/2021   Mesenteric thrombosis ([place]) 12/12/2020   Gastroenteritis 09/25/2020   Achilles tendon mass 09/25/2020   Hypertensive urgency 09/22/2020   Joint pain 09/22/2020   Recurrent UTI 03/26/2019   Cystitis 12/01/2018   Pulmonary nodule  08/04/2018   Liver transplant status ([place]) 10/10/2017   Advance directive discussed with patient 10/10/2017   Stage 3b chronic kidney disease ([place]) 10/09/2016   Mallory-Weiss tear 04/16/2016   Long-term use of immunosuppressant medication 04/08/2016   Sleep apnea 10/06/2015   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   Immunosuppression ([place]) 06/01/2012   Hypertension    Routine  general medical examination at a health care facility 04/26/2011   Chronic liver failure ([place]) 09/24/2010   Chronic tophaceous gout 12/21/2008   BPH without urinary obstruction 12/21/2008   ALLERGIC RHINITIS 05/22/2007   GERD 05/22/2007   HIATAL HERNIA 05/22/2007   IRRITABLE BOWEL SYNDROME, HX OF 05/22/2007   RENAL CALCULUS, HX OF 05/22/2007   PCP:  Venia Carbon, MD Pharmacy:   [place], [place], [address], [place] [address] Phone: [phone] Fax: [phone]  [id number] [address], [place] AT [facility] [address] [place] [address] Phone: [phone] Fax: [phone]     Social Determinants of Health (SDOH) Interventions    Readmission Risk Interventions    11/06/2021   11:09 AM  Readmission Risk Prevention Plan  Transportation Screening Complete  PCP or Specialist Appt within 5-7 Days Complete  Home Care Screening Complete  Medication Review (RN CM) Referral to Pharmacy

## 2021-11-06 NOTE — Progress Notes (Signed)
Unable to reach daughter, will postpone DC until tomorrow.

## 2021-11-06 NOTE — Evaluation (Signed)
Physical Therapy Evaluation Patient Details Name: Devin White MRN: [id number] DOB: 03-Oct-1955 Today's Date: 11/06/2021  History of Present Illness  Pt is a 66 y/o male admitted to [facility] for c diff and gout flare. During admission, pt developed headache and symptoms of L numbness. MRI negative and per neurology notes likely complicated migraine or trigeminal facial autonomic cephalgia. PMH includes liver transplant, gout, and BPH.  Clinical Impression  Pt admitted secondary to problem above with deficits below. Pt requiring min guard A for mobility tasks in the room. Mobility limited secondary to patient reported headache and dizziness; VSS throughout. Pt reports he does not feel the shot worked as well; Chiropodist. Feel pt will progress well and be able to d/c with HHPT. Will continue to follow acutely.        Recommendations for follow up therapy are one component of a multi-disciplinary discharge planning process, led by the attending physician.  Recommendations may be updated based on patient status, additional functional criteria and insurance authorization.  Follow Up Recommendations Home health PT    Assistance Recommended at Discharge Intermittent Supervision/Assistance  Patient can return home with the following  Help with stairs or ramp for entrance;Assist for transportation;Assistance with cooking/housework    Equipment Recommendations None recommended by PT  Recommendations for Other Services  OT consult    Functional Status Assessment Patient has had a recent decline in their functional status and demonstrates the ability to make significant improvements in function in a reasonable and predictable amount of time.     Precautions / Restrictions Precautions Precautions: Fall Restrictions Weight Bearing Restrictions: No      Mobility  Bed Mobility Overal bed mobility: Needs Assistance Bed Mobility: Supine to Sit, Sit to Supine     Supine to sit: Supervision Sit to  supine: Supervision   General bed mobility comments: supervision for safety    Transfers Overall transfer level: Needs assistance Equipment used: Rolling walker (2 wheels) Transfers: Sit to/from Stand Sit to Stand: Min guard           General transfer comment: Min guard for safety. Used RW to stand.    Ambulation/Gait Ambulation/Gait assistance: Min guard Gait Distance (Feet): 20 Feet Assistive device: Rolling walker (2 wheels) Gait Pattern/deviations: Step-through pattern, Decreased stride length Gait velocity: Decreased     General Gait Details: Pt able to ambulate short distance in room. Reporting dizziness, but VSS. Required seated rest. Pt dragged LLE at times, but at other times was able to take normal step.  Stairs            Wheelchair Mobility    Modified Rankin (Stroke Patients Only)       Balance Overall balance assessment: Needs assistance Sitting-balance support: Feet supported Sitting balance-Leahy Scale: Good     Standing balance support: Bilateral upper extremity supported Standing balance-Leahy Scale: Poor Standing balance comment: Reliant on BUE support                             Pertinent Vitals/Pain Pain Assessment Pain Assessment: Faces Faces Pain Scale: Hurts even more Pain Location: headache Pain Descriptors / Indicators: Headache Pain Intervention(s): Limited activity within patient's tolerance, Monitored during session, Repositioned    Home Living Family/patient expects to be discharged to:: Private residence Living Arrangements: Children (daughter) Available Help at Discharge: Family;Available PRN/intermittently Type of Home: House Home Access: Stairs to enter Entrance Stairs-Rails: None Entrance Stairs-Number of Steps: 1   Home Layout:  Able to live on main level with bedroom/bathroom Home Equipment: Rolling Walker (2 wheels);Rollator (4 wheels)      Prior Function Prior Level of Function :  Independent/Modified Independent                     Hand Dominance        Extremity/Trunk Assessment   Upper Extremity Assessment Upper Extremity Assessment: Defer to OT evaluation (pt reporting LUE numbness and weakness)    Lower Extremity Assessment Lower Extremity Assessment: LLE deficits/detail LLE Deficits / Details: Pt reporting weakness and numbness. Some inconsistency noted. Pt resistive when PT attempting to help with knee extension and pulling LE back into flexion. Also reports he could not perform hip flexion in sitting, but able to lift LLE back onto bed.    Cervical / Trunk Assessment Cervical / Trunk Assessment: Normal  Communication   Communication: No difficulties  Cognition Arousal/Alertness: Awake/alert Behavior During Therapy: WFL for tasks assessed/performed Overall Cognitive Status: Within Functional Limits for tasks assessed                                          General Comments      Exercises     Assessment/Plan    PT Assessment Patient needs continued PT services  PT Problem List Decreased mobility;Decreased activity tolerance;Decreased balance;Decreased knowledge of use of DME;Pain;Decreased strength       PT Treatment Interventions DME instruction;Gait training;Therapeutic activities;Therapeutic exercise;Patient/family education;Functional mobility training;Balance training;Stair training    PT Goals (Current goals can be found in the Care Plan section)  Acute Rehab PT Goals Patient Stated Goal: to have less pain and go home PT Goal Formulation: With patient Time For Goal Achievement: 11/20/21 Potential to Achieve Goals: Good    Frequency Min 3X/week     Co-evaluation               AM-PAC PT "6 Clicks" Mobility  Outcome Measure Help needed turning from your back to your side while in a flat bed without using bedrails?: None Help needed moving from lying on your back to sitting on the side of a flat  bed without using bedrails?: None Help needed moving to and from a bed to a chair (including a wheelchair)?: A Little Help needed standing up from a chair using your arms (e.g., wheelchair or bedside chair)?: A Little Help needed to walk in hospital room?: A Little Help needed climbing 3-5 steps with a railing? : A Lot 6 Click Score: 19    End of Session Equipment Utilized During Treatment: Gait belt Activity Tolerance: Patient limited by pain (headache) Patient left: in bed;with call bell/phone within reach;with bed alarm set Nurse Communication: Mobility status PT Visit Diagnosis: Pain;Unsteadiness on feet (R26.81);Muscle weakness (generalized) (M62.81) Pain - part of body:  (headache)    Time: 0263-7858 PT Time Calculation (min) (ACUTE ONLY): 19 min   Charges:   PT Evaluation $PT Eval Moderate Complexity: 1 Mod          Reuel Derby, PT, DPT  Acute Rehabilitation Services  Pager: [phone] Office: [phone]   Rudean Hitt 11/06/2021, 3:56 PM

## 2021-11-06 NOTE — Discharge Summary (Signed)
Physician Discharge Summary   Patient: AMMON MUSCATELLO MRN: [id number] DOB: 1955/09/03  Admit date:     10/26/2021  Discharge date: 11/07/21  Discharge Physician: Hosie Poisson   PCP: Venia Carbon, MD   Recommendations at discharge:  Please follow up with PCP in one week.  Please follow up with Dr Leonie Man in 2 months.    Discharge Diagnoses: Principal Problem:   Diarrhea Active Problems:   Chronic tophaceous gout   GERD   BPH without urinary obstruction   Hypertension   Liver transplant status (HCC)   Nausea and vomiting   Stage 3a chronic kidney disease (CKD) (HCC)   AKI (acute kidney injury) ([place])   Hyperbilirubinemia   Hyperglycemia   Obesity (BMI 30-39.9)    Hospital Course: 66yo with hx liver transplant, HTN, BPH, GERD presenting with N/V/D. He was recently discharged from [facility] for recurrent UTI. He was finishing a course of abx and felt nauseous each time he took the medication (ceftin). He began having diarrhea as well. He reports over 30 episodes of diarrhea since initiating the oral antibiotic. 2 days ago patient had left sided weakness with left sided headache and he was transferred to [facility] for evaluation of stroke. Neurology on board. DR Leonie Man suggested his symptoms are most likely from complicated migraine and less likely TIA, recommended topomax and imitrex and outpatient follow up with neurology in 2 months. Therapy eval recommending SNF.     Assessment and Plan:   C diff colitis: Completed the course of oral vancomycin.  Diarrhea is improving. Formed stool.      Bilateral Renal cysts:  -recommend outpatient follow up with Urology.      TIA:  With left sided residual paraesthesias. Differential include complicated migraine and Autonomic cephalgia CTA of the head and neck does not show any significant stenosis.  MRI negative for acute or sub acute infarct.  Echocardiogram is pending.  Hemoglobin A1c is 6.2% LDL is 69 Started the patient on  aspirin 325 mg daily, neurology added a trial of topomax and imitrex .  EEG done today as he had another episode of numbness on the left side of the face. No syncope . EEG is negative for seizures.  Therapy evaluations recommends Home health PT. He was given a dose of Imitrex and his symptoms improved. He was discharged on oral imitrex 100 mg  tab prn. may repeat x 1 in 24 hrs not more than 2 days per week. total 9 tablets. also Topmamax 50 mg bid. F/u with Dr Leonie Man in 2 months         Hypertension;  Permissive hypertension. Currently on amlodipine and prn hydralazine.      Hyperlipidemia:  LDL is 69 Started him on crestor 5 mg daily.      Body mass index is 35.84 kg/m. OBESITY;  Recommend outpatient follow up with PCP for weight loss.        H/o Liver Transplant:  On tacrolimus and mycophenolate.      Leukocytosis:  possibly from c diff diarrhea and colitis.  Resolved.      Mild normocytic anemia:  Continue to monitor.      Gouty flare up  Resolved.  Colchicine held for AKI.        AKI:  - Baseline creatinine around 1.4.  - creatinine increased to 1.7 Start gentle hydration, and stop the colchicine.  Repeat creatinine today is 1.5. .   Malnutrition Type:   Nutrition Problem: Increased nutrient needs Etiology:  acute illness, diarrhea     Malnutrition Characteristics:   Signs/Symptoms: estimated needs     Nutrition Interventions:   Interventions: Ensure Enlive (each supplement provides 350kcal and 20 grams of protein), MVI, Liberalize Diet   Estimated body mass index is 35.84 kg/m as calculated from the following:   Height as of this encounter: '5\' 11"'$  (1.803 m).   Weight as of this encounter: 116.6 kg.    Consultants: neurology.  Procedures performed: EEG  Disposition: Home Diet recommendation:  Discharge Diet Orders (From admission, onward)     Start     Ordered   11/06/21 0000  Diet - low sodium heart healthy        11/06/21 1647            Regular diet DISCHARGE MEDICATION: Allergies as of 11/06/2021   No Known Allergies      Medication List     STOP taking these medications    cefUROXime 250 MG tablet Commonly known as: CEFTIN       TAKE these medications    acetaminophen 325 MG tablet Commonly known as: TYLENOL Take 650 mg by mouth every 6 (six) hours as needed for mild pain.   amLODipine 5 MG tablet Commonly known as: NORVASC Take 5 mg by mouth.   ascorbic acid 500 MG tablet Commonly known as: VITAMIN C Take 1 tablet (500 mg total) by mouth daily. Start taking on: Nov 07, 2021   aspirin 325 MG tablet Take 1 tablet (325 mg total) by mouth daily. Start taking on: Nov 07, 2021   calcium carbonate 500 MG chewable tablet Commonly known as: Tums Chew 1 tablet (200 mg of elemental calcium total) by mouth 2 (two) times daily as needed for indigestion or heartburn.   colchicine 0.6 MG tablet Take 1 tablet (0.6 mg total) by mouth daily as needed. What changed:  when to take this reasons to take this   diclofenac Sodium 1 % Gel Commonly known as: VOLTAREN Apply 4 g topically 4 (four) times daily. What changed:  when to take this reasons to take this   feeding supplement (GLUCERNA SHAKE) Liqd Take 237 mLs by mouth 2 (two) times daily between meals. Start taking on: Nov 07, 2021   furosemide 40 MG tablet Commonly known as: LASIX Take 1 tablet (40 mg total) by mouth daily.   lisinopril 5 MG tablet Commonly known as: ZESTRIL Take 5 mg by mouth daily.   multivitamin with minerals Tabs tablet Take 1 tablet by mouth daily. Start taking on: Nov 07, 2021   mycophenolate 250 MG capsule Commonly known as: CELLCEPT Take 250-500 mg by mouth 2 (two) times daily. 2 capsules (500 mg) in the morning and one capsule (250 mg) in the evening   omeprazole 20 MG capsule Commonly known as: PRILOSEC TAKE 1 CAPSULE BY MOUTH TWICE DAILY What changed:  how much to take how to take this when to take  this   predniSONE 10 MG tablet Commonly known as: DELTASONE Take 10 mg by mouth daily with breakfast.   rosuvastatin 5 MG tablet Commonly known as: CRESTOR Take 1 tablet (5 mg total) by mouth every evening.   SUMAtriptan 100 MG tablet Commonly known as: IMITREX Take 1 tablet (100 mg total) by mouth once as needed for migraine (may repeat x 1 in 24 hrs not more than 2 days per week. total 9 tablets). May repeat in 2 hours if headache persists or recurs.   tacrolimus 0.5 MG capsule Commonly  known as: PROGRAF Take 0.5-1 mg by mouth See admin instructions. Taking 0.5 mg in the morning and 1 mg at bedtime   tamsulosin 0.4 MG Caps capsule Commonly known as: FLOMAX Take 1 capsule (0.4 mg total) by mouth daily.   topiramate 50 MG tablet Commonly known as: TOPAMAX Take 1 tablet (50 mg total) by mouth 2 (two) times daily.   vitamin A 3 MG (10000 UNITS) capsule Take 1 capsule (10,000 Units total) by mouth daily. Start taking on: Nov 07, 2021   zinc sulfate 220 (50 Zn) MG capsule Take 1 capsule (220 mg total) by mouth daily. Start taking on: Nov 07, 2021        Follow-up Information     Viviana Simpler I, MD. Schedule an appointment as soon as possible for a visit in 1 week(s).   Specialties: Internal Medicine, Pediatrics Contact information: [place] [address] [phone]         Garvin Fila, MD. Schedule an appointment as soon as possible for a visit in 2 month(s).   Specialties: Neurology, Radiology Contact information: [address] [place] [address] [phone]                Discharge Exam: Danley Danker Weights   10/26/21 0830  Weight: 116.6 kg   General exam: Appears calm and comfortable  Respiratory system: Clear to auscultation. Respiratory effort normal. Cardiovascular system: S1 & S2 heard, RRR. No JVD, murmurs, rubs, gallops or clicks. No pedal edema. Gastrointestinal system: Abdomen is nondistended,  soft and nontender. No organomegaly or masses felt. Normal bowel sounds heard. Central nervous system: Alert and oriented. No focal neurological deficits. Extremities: Symmetric 5 x 5 power. Skin: No rashes, lesions or ulcers Psychiatry: Judgement and insight appear normal. Mood & affect appropriate.    Condition at discharge: fair  The results of significant diagnostics from this hospitalization (including imaging, microbiology, ancillary and laboratory) are listed below for reference.   Imaging Studies: CT ABDOMEN PELVIS WO CONTRAST  Result Date: 10/29/2021 CLINICAL DATA:  66 year old male presents for evaluation of suspected prostate cancer, also with history of C difficile and watery diarrhea. * Tracking Code: BO * EXAM: CT ABDOMEN AND PELVIS WITHOUT CONTRAST TECHNIQUE: Multidetector CT imaging of the abdomen and pelvis was performed following the standard protocol without IV contrast. RADIATION DOSE REDUCTION: This exam was performed according to the departmental dose-optimization program which includes automated exposure control, adjustment of the mA and/or kV according to patient size and/or use of iterative reconstruction technique. COMPARISON:  Oct 26, 2021. FINDINGS: Lower chest: Signs of basilar atelectasis. Ground-glass and consolidative changes in the costodiaphragmatic sulcus on the RIGHT in a patient with RIGHT greater than LEFT CVA tenderness. Hepatobiliary: Smooth hepatic contours. Post liver transplant. No stranding about the liver or adjacent fluid. Pancreas: Mild pancreatic atrophy without contour abnormality or signs of inflammation. Spleen: Normal. Adrenals/Urinary Tract: Adrenal glands are normal. Renal cortical scarring bilaterally which is symmetric with moderate parenchymal thinning. No hydronephrosis. Small cyst in the LEFT kidney for which no follow-up is recommended measuring 2.2 cm, unchanged compared to recent imaging. A small low-density lesions in the RIGHT kidney are  stable as well and also likely small cysts require no follow-up. No perivesical stranding.  No ureteral calculi. Stomach/Bowel: Diminished bowel distension compared to the prior study. No signs of bowel obstruction. Trace fluid along the LEFT hemiabdomen adjacent to small bowel loops without substantial perienteric stranding may be result of ongoing but subsiding  inflammation. Formed stool present in the colon liquid stool present previously. Colonic diverticulosis. No pneumatosis. Vascular/Lymphatic: Aortic atherosclerosis with scattered atherosclerotic changes throughout the abdominal aorta. No aneurysmal dilation. There is no gastrohepatic or hepatoduodenal ligament lymphadenopathy. No retroperitoneal or mesenteric lymphadenopathy. No pelvic sidewall lymphadenopathy. Reproductive: Unremarkable by CT. Other: Small fat containing umbilical hernia. Trace fluid in the abdomen as outlined above. Musculoskeletal: No acute bone finding. No destructive bone process. Spinal degenerative changes. Signs of prior spinal fusion. Fusion extending from L3 through S1. IMPRESSION: 1. Signs of basilar atelectasis but with ground-glass and consolidative changes at the RIGHT lung base raising the question of pneumonia or pulmonary infarct. Would correlate with patient's symptoms and consider lower extremity Doppler evaluation or chest CT for further assessment to evaluate for source or direct signs of pulmonary embolism as warranted. 2. Post liver transplant. 3. Signs of colonic diverticulosis. No signs of bowel obstruction with formed stool in the colon currently without signs of perienteric or pericolonic stranding but with small amount of ascites in the LEFT hemiabdomen presumably related to prior inflammation. 4. Aortic atherosclerosis. Aortic Atherosclerosis (ICD10-I70.0). These results will be called to the ordering clinician or representative by the Radiologist Assistant, and communication documented in the PACS or [employer]. Electronically Signed   By: Zetta Bills M.D.   On: 10/29/2021 15:05   CT ABDOMEN PELVIS WO CONTRAST  Result Date: 10/26/2021 CLINICAL DATA:  Generalized abdominal pain. Nausea and vomiting. Diarrhea. On antibiotics for urinary tract infection. Liver transplant 12 years ago. EXAM: CT ABDOMEN AND PELVIS WITHOUT CONTRAST TECHNIQUE: Multidetector CT imaging of the abdomen and pelvis was performed following the standard protocol without IV contrast. RADIATION DOSE REDUCTION: This exam was performed according to the departmental dose-optimization program which includes automated exposure control, adjustment of the mA and/or kV according to patient size and/or use of iterative reconstruction technique. COMPARISON:  04/07/2021 abdominal ultrasound.  CT 11/21/2020 FINDINGS: Lower chest: Mild subpleural reticulation at the lung bases. Mild cardiomegaly. Lipomatous hypertrophy of the interatrial septum. Right coronary artery calcification. Fluid a in the distal esophagus on 01/02. Hepatobiliary: Status post liver transplant. Cholecystectomy, without biliary ductal dilatation. Pancreas: Mild pancreatic atrophy. No duct dilatation or acute inflammation. Spleen: Normal in size, without focal abnormality. Adrenals/Urinary Tract: Normal adrenal glands. Mild renal cortical thinning bilaterally. 2 mm interpolar left renal collecting system calculus. Bilateral fluid density renal lesions which are likely cysts. Upper pole punctate right renal collecting system calculus. No hydronephrosis. No hydroureter or ureteric calculi. No bladder calculi. Stomach/Bowel: Surgical or biopsy clip at the proximal stomach. Probable lipoma of 8 mm in the inferior genu of the duodenum, present on the prior. The colon is diffusely fluid-filled, consistent with the clinical history of diarrhea. Scattered colonic diverticula. Appendectomy. Small bowel loops are also fluid-filled including up to 4.1 cm. No focal bowel transition to suggest  obstruction Vascular/Lymphatic: Aortic atherosclerosis. No abdominopelvic adenopathy. Reproductive: Normal prostate. Other: No abdominopelvic fluid or free intraperitoneal air. Small fat containing paraumbilical hernia. Nonspecific right abdominal wall subcutaneous nodule of 1.2 cm on 54/2. Musculoskeletal: L4-S1 trans pedicle screw fixation. IMPRESSION: 1. Fluid-filled colon, consistent with the clinical history of diarrhea. Mildly dilated small bowel loops may represent concurrent ileus. No transition point to suggest bowel obstruction. 2. Bilateral nephrolithiasis without obstructive uropathy. 3. Mild subpleural reticulation at the lung bases could represent interstitial lung disease such as nonspecific interstitial pneumonitis. Correlate with pulmonary symptoms. 4. Aortic Atherosclerosis (ICD10-I70.0). Coronary artery atherosclerosis. 5. Esophageal air fluid level suggests dysmotility or gastroesophageal  reflux. Electronically Signed   By: Abigail Miyamoto M.D.   On: 10/26/2021 09:26   MR BRAIN WO CONTRAST  Result Date: 11/02/2021 CLINICAL DATA:  Stroke suspected, left-sided pressure and numbness EXAM: MRI HEAD WITHOUT CONTRAST TECHNIQUE: Multiplanar, multiecho pulse sequences of the brain and surrounding structures were obtained without intravenous contrast. COMPARISON:  09/14/2021 MRI head, correlation is also made with 11/02/2021 CTA head neck FINDINGS: Brain: No restricted diffusion to suggest acute or subacute infarct. No acute hemorrhage, mass, mass effect, or midline shift. No hydrocephalus or extra-axial collection. Scattered T2 hyperintense signal in the periventricular white matter, likely the sequela of moderate chronic small vessel ischemic disease. Vascular: Again noted is a hypoplastic right vertebral artery. Otherwise normal arterial flow voids. Skull and upper cervical spine: Normal marrow signal. Sinuses/Orbits: Mucous retention cysts in the left-greater-than-right maxillary sinus. Otherwise  negative. The orbits are unremarkable. Other: The mastoids are well aerated. IMPRESSION: No acute intracranial process. No evidence of acute or subacute infarct. Electronically Signed   By: Merilyn Baba M.D.   On: 11/02/2021 11:20   US RENAL  Result Date: 10/28/2021 CLINICAL DATA:  Dysuria EXAM: RENAL / URINARY TRACT ULTRASOUND COMPLETE COMPARISON:  10/26/2021 FINDINGS: Right Kidney: Renal measurements: 12.5 x 6.1 x 7.6 cm. = volume: 305 mL. Increased echogenicity is noted with cortical thinning. Renal cystic change is noted and stable. Left Kidney: Renal measurements: 12.9 x 5.4 x 8.3 cm. = volume: 302 mL. Multiple cysts are again identified stable in appearance from the prior exam. Lower pole cyst demonstrates some mild septation. No hydronephrosis is noted. Increased echogenicity and cortical thinning is noted. Bladder: Appears normal for degree of bladder distention. Other: Prominent prostate indenting upon the bladder. IMPRESSION: Stable appearing renal cysts bilaterally. No evidence of hydronephrosis. Changes consistent with medical renal disease. Electronically Signed   By: Inez Catalina M.D.   On: 10/28/2021 19:59   US RENAL  Result Date: 10/26/2021 CLINICAL DATA:  Acute renal insufficiency EXAM: RENAL / URINARY TRACT ULTRASOUND COMPLETE COMPARISON:  10/26/2021 FINDINGS: Right Kidney: Renal measurements: 11.2 x 5.0 x 6.7 cm = volume: 198 mL. Increased renal cortical echotexture consistent with medical renal disease. Mild renal cortical thinning. No hydronephrosis or nephrolithiasis. Simple 2.9 cm cyst upper pole right kidney does not require follow-up. Left Kidney: Renal measurements: 10.6 x 4.2 x 6.6 cm = volume: 154 mL. Increased renal cortical echotexture consistent with medical renal disease. Mild renal cortical thinning. No hydronephrosis or nephrolithiasis. Multiple small left renal cortical cysts are identified. Bilobed cyst versus cyst with single septation lower pole left kidney measures up  to 1.9 x 1.4 x 1.7 cm. 2.9 cm simple cyst upper pole left kidney. These do not require follow-up. Bladder: Appears normal for degree of bladder distention. Other: None. IMPRESSION: 1. Increased renal cortical echotexture and bilateral renal cortical thinning consistent with chronic medical renal disease. 2. Benign bilateral renal cysts which do not require specific follow-up. Electronically Signed   By: Randa Ngo M.D.   On: 10/26/2021 17:18   DG Chest Port 1 View  Result Date: 10/26/2021 CLINICAL DATA:  Chills, cough, fever EXAM: PORTABLE CHEST 1 VIEW COMPARISON:  09/14/2021 FINDINGS: Mild cardiomegaly. Both lungs are clear. The visualized skeletal structures are unremarkable. IMPRESSION: Mild cardiomegaly without acute abnormality of the lungs in AP portable projection. Electronically Signed   By: Delanna Ahmadi M.D.   On: 10/26/2021 09:29   EEG adult  Result Date: 11/05/2021 Lora Havens, MD     11/05/2021  4:19  PM Patient Name: DEAN GOLDNER MRN: [id number] Epilepsy Attending: Lora Havens Referring Physician/Provider: Katy Apo, NP Date: 11/05/2021 Duration: 28.06 mins Patient history: 66yo M patient presented with recurrent stereotypical episodes of along with left face and body paresthesias with some weakness. EEG to evaluate for seizure Level of alertness: Awake, asleep AEDs during EEG study: None Technical aspects: This EEG study was done with scalp electrodes positioned according to the 10-20 International system of electrode placement. Electrical activity was acquired at a sampling rate of '500Hz'$  and reviewed with a high frequency filter of '70Hz'$  and a low frequency filter of '1Hz'$ . EEG data were recorded continuously and digitally stored. Description: The posterior dominant rhythm consists of 8-9 Hz activity of moderate voltage (25-35 uV) seen predominantly in posterior head regions, symmetric and reactive to eye opening and eye closing. Sleep was characterized by vertex  waves, sleep spindles (12 to 14 Hz), maximal frontocentral region. Hyperventilation and photic stimulation were not performed.   Of note, EEG was technically difficult due to significant ekg artifact. IMPRESSION: This study is within normal limits. No seizures or epileptiform discharges were seen throughout the recording. Lora Havens   ECHOCARDIOGRAM COMPLETE  Result Date: 11/05/2021    ECHOCARDIOGRAM REPORT   Patient Name:   GAVRIEL HOLZHAUER Date of Exam: 11/05/2021 Medical Rec #:  [id number]        Height:       71.0 in Accession #:    [id number]       Weight:       257.0 lb Date of Birth:  [birth year]        BSA:          2.346 m Patient Age:    45 years         BP:           146/108 mmHg Patient Gender: M                HR:           81 bpm. Exam Location:  Inpatient Procedure: 2D Echo, Cardiac Doppler and Color Doppler Indications:    TIA  History:        Patient has prior history of Echocardiogram examinations, most                 recent 12/15/2015. Risk Factors:Hypertension. Liver transplant.                 GERD. CKD.  Sonographer:    Machamer Lefort RDCS (AE) Referring Phys: [id number] [facility]  Sonographer Comments: Patient is morbidly obese. Image acquisition challenging due to patient body habitus and bright ambient light in [facility] rooms. IMPRESSIONS  1. Left ventricular ejection fraction, by estimation, is 65 to 70%. The left ventricle has normal function. The left ventricle has no regional wall motion abnormalities. There is moderate hypertrophy of basal-septum. The rest of the LV segments demonstrate mild left ventricular hypertrophy. Left ventricular diastolic parameters are consistent with Grade I diastolic dysfunction (impaired relaxation).  2. Right ventricular systolic function is normal. The right ventricular size is normal.  3. The mitral valve is normal in structure. Trivial mitral valve regurgitation.  4. The aortic valve is tricuspid. Aortic valve regurgitation is not visualized. No aortic  stenosis is present.  5. Aortic dilatation noted. There is borderline dilatation of the aortic root, measuring 38 mm. Comparison(s): Compared to prior echo report in 2017, there is no significant change. Conclusion(s)/Recommendation(s): No intracardiac source  of embolism detected on this transthoracic study. Consider a transesophageal echocardiogram to exclude cardiac source of embolism if clinically indicated. FINDINGS  Left Ventricle: Left ventricular ejection fraction, by estimation, is 65 to 70%. The left ventricle has normal function. The left ventricle has no regional wall motion abnormalities. The left ventricular internal cavity size was normal in size. There is  moderate hypertrophy of basal-septum. The rest of the LV segments demonstrate mild left ventricular hypertrophy. Left ventricular diastolic parameters are consistent with Grade I diastolic dysfunction (impaired relaxation). Right Ventricle: The right ventricular size is normal. No increase in right ventricular wall thickness. Right ventricular systolic function is normal. Left Atrium: Left atrial size was normal in size. Right Atrium: Right atrial size was normal in size. Pericardium: There is no evidence of pericardial effusion. Mitral Valve: The mitral valve is normal in structure. Trivial mitral valve regurgitation. Tricuspid Valve: The tricuspid valve is normal in structure. Tricuspid valve regurgitation is trivial. Aortic Valve: The aortic valve is tricuspid. Aortic valve regurgitation is not visualized. No aortic stenosis is present. Aortic valve mean gradient measures 3.0 mmHg. Aortic valve peak gradient measures 5.0 mmHg. Aortic valve area, by VTI measures 4.48 cm. Pulmonic Valve: The pulmonic valve was normal in structure. Pulmonic valve regurgitation is trivial. Aorta: Aortic dilatation noted. There is borderline dilatation of the aortic root, measuring 38 mm. IAS/Shunts: The atrial septum is grossly normal.  LEFT VENTRICLE PLAX 2D LVIDd:          5.10 cm   Diastology LVIDs:         3.00 cm   LV e' medial:    4.57 cm/s LV PW:         1.20 cm   LV E/e' medial:  11.2 LV IVS:        1.50 cm   LV e' lateral:   8.59 cm/s LVOT diam:     2.40 cm   LV E/e' lateral: 6.0 LV SV:         99 LV SV Index:   42 LVOT Area:     4.52 cm  RIGHT VENTRICLE RV Basal diam:  2.70 cm RV S prime:     15.70 cm/s LEFT ATRIUM             Index        RIGHT ATRIUM          Index LA diam:        4.00 cm 1.71 cm/m   RA Area:     9.01 cm LA Vol (A2C):   74.9 ml 31.93 ml/m  RA Volume:   16.70 ml 7.12 ml/m LA Vol (A4C):   56.2 ml 23.96 ml/m LA Biplane Vol: 67.9 ml 28.95 ml/m  AORTIC VALVE AV Area (Vmax):    4.56 cm AV Area (Vmean):   4.21 cm AV Area (VTI):     4.48 cm AV Vmax:           112.00 cm/s AV Vmean:          77.600 cm/s AV VTI:            0.220 m AV Peak Grad:      5.0 mmHg AV Mean Grad:      3.0 mmHg LVOT Vmax:         113.00 cm/s LVOT Vmean:        72.200 cm/s LVOT VTI:          0.218 m LVOT/AV VTI ratio: 0.99  AORTA Ao  Root diam: 3.80 cm Ao Asc diam:  3.60 cm MITRAL VALVE MV Area (PHT): 2.88 cm    SHUNTS MV Decel Time: 263 msec    Systemic VTI:  0.22 m MV E velocity: 51.40 cm/s  Systemic Diam: 2.40 cm MV A velocity: 66.80 cm/s MV E/A ratio:  0.77 Gwyndolyn Kaufman MD Electronically signed by Gwyndolyn Kaufman MD Signature Date/Time: 11/05/2021/3:07:49 PM    Final    CT ANGIO HEAD NECK W WO CM (CODE STROKE)  Addendum Date: 11/02/2021   ADDENDUM REPORT: 11/02/2021 05:06 ADDENDUM: Study discussed by telephone with RN Letta Kocher on 11/02/2021 at 0454 hours. Electronically Signed   By: Genevie Ann M.D.   On: 11/02/2021 05:06   Result Date: 11/02/2021 CLINICAL DATA:  66 year old male left side headache, pain radiating down the arm. Neurologic deficit. EXAM: CT ANGIOGRAPHY HEAD AND NECK TECHNIQUE: Multidetector CT imaging of the head and neck was performed using the standard protocol during bolus administration of intravenous contrast. Multiplanar CT image  reconstructions and MIPs were obtained to evaluate the vascular anatomy. Carotid stenosis measurements (when applicable) are obtained utilizing NASCET criteria, using the distal internal carotid diameter as the denominator. RADIATION DOSE REDUCTION: This exam was performed according to the departmental dose-optimization program which includes automated exposure control, adjustment of the mA and/or kV according to patient size and/or use of iterative reconstruction technique. CONTRAST:  27m OMNIPAQUE IOHEXOL 350 MG/ML SOLN COMPARISON:  Brain MRI 09/14/2021. CTA head and neck 04/07/2021. Head CT 09/14/2021 and earlier. FINDINGS: CT HEAD Brain: No midline shift, ventriculomegaly, mass effect, evidence of mass lesion, intracranial hemorrhage or evidence of cortically based acute infarction. Patchy bilateral cerebral white matter hypodensity appears stable since March. Deep gray nuclei relatively spared. Stable gray-white matter differentiation throughout the brain. Calvarium and skull base: No acute osseous abnormality identified. Paranasal sinuses: Stable mild paranasal sinus mucosal thickening and/or retention cysts. Tympanic cavities and mastoids remain well aerated. Orbits: No acute orbit or scalp soft tissue finding. CTA NECK Skeleton: Chronic cervical spine degeneration with anterolisthesis C3 on C4 and facet arthropathy. Spina bifida occulta at T1. No acute osseous abnormality identified. Upper chest: Stable. Mediastinal lipomatosis, mild apical lung scarring or atelectasis. Other neck: Stable, negative. Aortic arch: 3 vessel arch configuration. Mild tortuosity and calcified atherosclerosis of the distal arch. Right carotid system: Brachiocephalic artery and right CCA origin are mildly tortuous without plaque or stenosis. Negative right carotid bifurcation. Mild tortuosity of the cervical right ICA. Left carotid system: Similar tortuosity without plaque or stenosis. Vertebral arteries: Tortuous proximal right  subclavian artery without plaque or stenosis. Normal right vertebral artery origin. Non dominant right vertebral artery remains diminutive but patent to the skull base without stenosis. Proximal left subclavian artery and left vertebral artery origin are normal. Dominant, tortuous left vertebral artery is patent to the skull base with no plaque or stenosis. CTA HEAD Posterior circulation: Dominant left and diminutive right distal vertebral arteries are patent to the vertebrobasilar junction without stenosis. Normal left PICA origin. Right AICA appears dominant. Patent basilar artery with mild tortuosity, no stenosis. Patent SCA origins. Fetal type bilateral PCA origins more so the left. Bilateral PCA branches are stable and within normal limits. Anterior circulation: Both ICA siphons are patent. Mild bilateral siphon calcified plaque. No siphon stenosis. Mild tortuosity bilaterally. Ophthalmic and posterior communicating artery origins are normal. Patent carotid termini. Patent MCA and ACA origins. Dominant right A1 segment. Anterior communicating artery may be mildly fenestrated but appears stable. Bilateral ACA branches are stable and  within normal limits. Left MCA M1 segment and trifurcation are patent without stenosis. Right MCA M1 segment and trifurcation are patent without stenosis. Bilateral MCA branches are stable and within normal limits. Venous sinuses: Patent. Anatomic variants: Dominant left and diminutive right vertebral arteries. Fetal type bilateral PCA origins more so the left. Dominant right and diminutive left ACA A1 segments. Review of the MIP images confirms the above findings IMPRESSION: 1. Stable CTA Head and Neck since March. Mild for age atherosclerosis with no arterial stenosis. 2. Stable CT appearance of the brain since March, no acute intracranial abnormality identified. 3. Chronically advanced cervical spine degeneration. Electronically Signed: By: Genevie Ann M.D. On: 11/02/2021 04:45     Microbiology: Results for orders placed or performed during the hospital encounter of 10/26/21  Gastrointestinal Panel by PCR , Stool     Status: None   Collection Time: 10/26/21  8:49 AM   Specimen: Stool  Result Value Ref Range Status   Campylobacter species NOT DETECTED NOT DETECTED Final   Plesimonas shigelloides NOT DETECTED NOT DETECTED Final   Salmonella species NOT DETECTED NOT DETECTED Final   Yersinia enterocolitica NOT DETECTED NOT DETECTED Final   Vibrio species NOT DETECTED NOT DETECTED Final   Vibrio cholerae NOT DETECTED NOT DETECTED Final   Enteroaggregative E coli (EAEC) NOT DETECTED NOT DETECTED Final   Enteropathogenic E coli (EPEC) NOT DETECTED NOT DETECTED Final   Enterotoxigenic E coli (ETEC) NOT DETECTED NOT DETECTED Final   Shiga like toxin producing E coli (STEC) NOT DETECTED NOT DETECTED Final   Shigella/Enteroinvasive E coli (EIEC) NOT DETECTED NOT DETECTED Final   Cryptosporidium NOT DETECTED NOT DETECTED Final   Cyclospora cayetanensis NOT DETECTED NOT DETECTED Final   Entamoeba histolytica NOT DETECTED NOT DETECTED Final   Giardia lamblia NOT DETECTED NOT DETECTED Final   Adenovirus F40/41 NOT DETECTED NOT DETECTED Final   Astrovirus NOT DETECTED NOT DETECTED Final   Norovirus GI/GII NOT DETECTED NOT DETECTED Final   Rotavirus A NOT DETECTED NOT DETECTED Final   Sapovirus (I, II, IV, and V) NOT DETECTED NOT DETECTED Final    Comment: Performed at [facility], [place] [address] [place], [place] [address]  C Difficile Quick Screen w PCR reflex     Status: Abnormal   Collection Time: 10/26/21  8:49 AM   Specimen: Stool  Result Value Ref Range Status   C Diff antigen POSITIVE (A) NEGATIVE Final   C Diff toxin NEGATIVE NEGATIVE Final   C Diff interpretation Results are indeterminate. See PCR results.  Final    Comment: Performed at [facility], [place] [address], [place], [place] [address]  Blood culture (routine x 2)      Status: None   Collection Time: 10/26/21  8:49 AM   Specimen: BLOOD LEFT ARM  Result Value Ref Range Status   Specimen Description BLOOD LEFT ARM  Final   Special Requests   Final    BLOOD Blood Culture results may not be optimal due to an inadequate volume of blood received in culture bottles   Culture   Final    NO GROWTH 5 DAYS Performed at [facility], [place] [address] [place], [place] [address]    Report Status 10/31/2021 FINAL  Final  C. Diff by PCR, Reflexed     Status: Abnormal   Collection Time: 10/26/21  8:49 AM  Result Value Ref Range Status   Toxigenic C. Difficile by PCR POSITIVE (A) NEGATIVE Final  Comment: Positive for toxigenic C. difficile with little to no toxin production. Only treat if clinical presentation suggests symptomatic illness. Performed at [facility], [place] [address], [place], [place] [address]   Blood culture (routine x 2)     Status: None   Collection Time: 10/26/21  9:58 AM   Specimen: BLOOD LEFT HAND  Result Value Ref Range Status   Specimen Description   Final    BLOOD LEFT HAND Performed at [facility], [place] [address] [place], [place] [address]    Special Requests   Final    BOTTLES DRAWN AEROBIC AND ANAEROBIC Blood Culture adequate volume Performed at [place] [address], [place], [place] [address]    Culture   Final    NO GROWTH 5 DAYS Performed at [facility], [place] [address], [place], [place] [address]    Report Status 10/31/2021 FINAL  Final  Resp Panel by RT-PCR (Flu A&B, Covid) Nasopharyngeal Swab     Status: None   Collection Time: 10/27/21 10:11 AM   Specimen: Nasopharyngeal Swab; Nasopharyngeal(NP) swabs in vial transport medium  Result Value Ref Range Status   SARS Coronavirus 2 by RT PCR NEGATIVE NEGATIVE Final    Comment: (NOTE) SARS-CoV-2 target nucleic acids are NOT DETECTED.  The SARS-CoV-2 RNA is generally detectable in upper respiratory specimens during the acute  phase of infection. The lowest concentration of SARS-CoV-2 viral copies this assay can detect is 138 copies/mL. A negative result does not preclude SARS-Cov-2 infection and should not be used as the sole basis for treatment or other patient management decisions. A negative result may occur with  improper specimen collection/handling, submission of specimen other than nasopharyngeal swab, presence of viral mutation(s) within the areas targeted by this assay, and inadequate number of viral copies(<138 copies/mL). A negative result must be combined with clinical observations, patient history, and epidemiological information. The expected result is Negative.  Fact Sheet for Patients:  EntrepreneurPulse.com.au  Fact Sheet for Healthcare Providers:  IncredibleEmployment.be  This test is no t yet approved or cleared by the [place] FDA and  has been authorized for detection and/or diagnosis of SARS-CoV-2 by FDA under an Emergency Use Authorization (EUA). This EUA will remain  in effect (meaning this test can be used) for the duration of the COVID-19 declaration under Section 564(b)(1) of the Act, 21 U.S.C.section 360bbb-3(b)(1), unless the authorization is terminated  or revoked sooner.       Influenza A by PCR NEGATIVE NEGATIVE Final   Influenza B by PCR NEGATIVE NEGATIVE Final    Comment: (NOTE) The Xpert Xpress SARS-CoV-2/FLU/RSV plus assay is intended as an aid in the diagnosis of influenza from Nasopharyngeal swab specimens and should not be used as a sole basis for treatment. Nasal washings and aspirates are unacceptable for Xpert Xpress SARS-CoV-2/FLU/RSV testing.  Fact Sheet for Patients: EntrepreneurPulse.com.au  Fact Sheet for Healthcare Providers: IncredibleEmployment.be  This test is not yet approved or cleared by the [place] FDA and has been authorized for detection and/or diagnosis of  SARS-CoV-2 by FDA under an Emergency Use Authorization (EUA). This EUA will remain in effect (meaning this test can be used) for the duration of the COVID-19 declaration under Section 564(b)(1) of the Act, 21 U.S.C. section 360bbb-3(b)(1), unless the authorization is terminated or revoked.  Performed at [facility], [place] [address], [place], [place] [address]   Urine Culture     Status: None   Collection Time:  10/27/21  3:45 PM   Specimen: Urine, Clean Catch  Result Value Ref Range Status   Specimen Description   Final    URINE, CLEAN CATCH Performed at [facility], [place] [address], [place], [place] [address]    Special Requests   Final    NONE Performed at [facility], [place] [address] [place], [place] [address]    Culture   Final    NO GROWTH Performed at [facility], [place] [address] [place], [place] [address]    Report Status 10/29/2021 FINAL  Final    Labs: CBC: Recent Labs  Lab 11/02/21 0455 11/03/21 0421 11/04/21 0054 11/05/21 0039 11/06/21 0342  WBC 15.0* 12.0* 12.6* 11.3* 10.2  NEUTROABS 12.9* 9.7* 9.9* 8.4* 9.0*  HGB 11.7* 12.3* 12.7* 13.5 13.3  HCT 34.5* 35.4* 37.2* 38.7* 37.6*  MCV 95.3 96.2 94.9 93.7 93.5  PLT 181 205 234 217 982   Basic Metabolic Panel: Recent Labs  Lab 11/01/21 0452 11/02/21 0455 11/03/21 0421 11/04/21 0054 11/05/21 0039 11/06/21 0342  NA 141 139 141 137 138  --   K 4.9 4.7 4.4 4.5 4.5  --   CL 107 106 105 102 101  --   CO2 '26 23 28 26 29  '$ --   GLUCOSE 197* 164* 178* 193* 164*  --   BUN 20 27* 26* 35* 35*  --   CREATININE 1.40* 1.17 1.43* 1.70* 1.53*  --   CALCIUM 9.4 9.1 9.2 9.1 9.1  --   MG 2.3 2.4 2.8* 2.7* 2.7* 2.8*  PHOS 1.7* 4.3 4.7* 3.4 3.6 3.3   Liver Function Tests: Recent Labs  Lab 10/31/21 0435 11/01/21 0452 11/02/21 0455 11/03/21 0421 11/04/21 0054  AST 14* '15 20 20 22  '$ ALT 33 32 34 46* 53*  ALKPHOS 34* 41 45 48 50  BILITOT 0.8 0.7 0.7  0.8 0.3  PROT 5.7* 6.4* 6.3* 6.3* 5.7*  ALBUMIN 3.1* 3.3* 3.3* 3.4* 3.2*   CBG: Recent Labs  Lab 11/02/21 0441  GLUCAP 160*    Discharge time spent: 45 MINUTES.   Signed: Hosie Poisson, MD [facility] 11/06/2021

## 2021-11-07 DIAGNOSIS — N400 Enlarged prostate without lower urinary tract symptoms: Secondary | ICD-10-CM | POA: Diagnosis not present

## 2021-11-07 DIAGNOSIS — R197 Diarrhea, unspecified: Secondary | ICD-10-CM | POA: Diagnosis not present

## 2021-11-07 LAB — CBC WITH DIFFERENTIAL/PLATELET
Abs Immature Granulocytes: 0.78 10*3/uL — ABNORMAL HIGH (ref 0.00–0.07)
Basophils Absolute: 0.1 10*3/uL (ref 0.0–0.1)
Basophils Relative: 0 %
Eosinophils Absolute: 0 10*3/uL (ref 0.0–0.5)
Eosinophils Relative: 0 %
HCT: 40.2 % (ref 39.0–52.0)
Hemoglobin: 14 g/dL (ref 13.0–17.0)
Immature Granulocytes: 6 %
Lymphocytes Relative: 11 %
Lymphs Abs: 1.5 10*3/uL (ref 0.7–4.0)
MCH: 32.6 pg (ref 26.0–34.0)
MCHC: 34.8 g/dL (ref 30.0–36.0)
MCV: 93.7 fL (ref 80.0–100.0)
Monocytes Absolute: 1 10*3/uL (ref 0.1–1.0)
Monocytes Relative: 7 %
Neutro Abs: 10.5 10*3/uL — ABNORMAL HIGH (ref 1.7–7.7)
Neutrophils Relative %: 76 %
Platelets: 224 10*3/uL (ref 150–400)
RBC: 4.29 MIL/uL (ref 4.22–5.81)
RDW: 13.7 % (ref 11.5–15.5)
WBC: 13.9 10*3/uL — ABNORMAL HIGH (ref 4.0–10.5)
nRBC: 0.1 % (ref 0.0–0.2)

## 2021-11-07 LAB — PHOSPHORUS: Phosphorus: 3.2 mg/dL (ref 2.5–4.6)

## 2021-11-07 LAB — MAGNESIUM: Magnesium: 3.1 mg/dL — ABNORMAL HIGH (ref 1.7–2.4)

## 2021-11-07 NOTE — TOC Transition Note (Signed)
Transition of Care [facility]) - CM/SW Discharge Note   Patient Details  Name: Devin White MRN: [id number] Date of Birth: Mar 12, 1956  Transition of Care [facility]) CM/SW Contact:  Pollie Friar, RN Phone Number: 11/07/2021, 10:12 AM   Clinical Narrative:    Patient is discharging home with his daughter. He will receive home health services through [facility]. Information on the AVS.  Pt has needed DME at home.  Pt's daughter to provide transport home today.   Final next level of care: Home w Home Health Services Barriers to Discharge: No Barriers Identified   Patient Goals and CMS Choice   CMS Medicare.gov Compare Post Acute Care list provided to:: Patient Choice offered to / list presented to : Patient  Discharge Placement                       Discharge Plan and Services   Discharge Planning Services: CM Consult                      HH Arranged: PT [facility] Agency: [facility] [place] Date [place]: 11/07/21   Representative spoke with at [place]: [place] ([place]) Interventions     Readmission Risk Interventions    11/06/2021   11:09 AM  Readmission Risk Prevention Plan  Transportation Screening Complete  PCP or Specialist Appt within 5-7 Days Complete  Home Care Screening Complete  Medication Review (RN CM) Referral to Pharmacy

## 2021-11-07 NOTE — Evaluation (Addendum)
Occupational Therapy Evaluation Patient Details Name: Devin White MRN: [id number] DOB: [birth year] Today's Date: 11/07/2021   History of Present Illness Pt is a 66 y/o male admitted to [facility] for c diff and gout flare. During admission, pt developed headache and symptoms of L numbness. MRI negative and per neurology notes likely complicated migraine or trigeminal facial autonomic cephalgia. PMH includes liver transplant, gout, and BPH.   Clinical Impression   Pt independent at baseline with ADLs and functional mobility, pt currently requiring requiring supervision-mod A for ADLs, supervision for bed mobility and min guard for transfers with RW. Pt reporting dizziness upon initially sitting up,  BP WNL. Pt with decreased strength, ROM, and sensation in LUE, LLE, and L face at rest, also endorsing blurry vision in L eye that worsens when he grips/weigh bears through LUE. Pt states daughter will be at home and will be able to drive him places. Pt presenting with impairments listed below, will follow acutely. Recommend d/c home with [place].     Recommendations for follow up therapy are one component of a multi-disciplinary discharge planning process, led by the attending physician.  Recommendations may be updated based on patient status, additional functional criteria and insurance authorization.   Follow Up Recommendations  Home health OT    Assistance Recommended at Discharge Set up Supervision/Assistance  Patient can return home with the following A little help with walking and/or transfers;A little help with bathing/dressing/bathroom;Assistance with cooking/housework;Help with stairs or ramp for entrance;Assist for transportation;Direct supervision/assist for medications management;Direct supervision/assist for financial management    Functional Status Assessment  Patient has had a recent decline in their functional status and demonstrates the ability to make significant improvements in function  in a reasonable and predictable amount of time.  Equipment Recommendations  BSC/3in1    Recommendations for Other Services PT consult     Precautions / Restrictions Precautions Precautions: Fall Restrictions Weight Bearing Restrictions: No      Mobility Bed Mobility Overal bed mobility: Needs Assistance Bed Mobility: Supine to Sit     Supine to sit: Supervision          Transfers Overall transfer level: Needs assistance Equipment used: Rolling walker (2 wheels) Transfers: Sit to/from Stand Sit to Stand: Min guard                  Balance Overall balance assessment: Needs assistance Sitting-balance support: Feet supported Sitting balance-Leahy Scale: Good     Standing balance support: Bilateral upper extremity supported Standing balance-Leahy Scale: Poor Standing balance comment: Reliant on BUE support                           ADL either performed or assessed with clinical judgement   ADL Overall ADL's : Needs assistance/impaired Eating/Feeding: Set up;Sitting   Grooming: Wash/dry hands;Wash/dry face;Oral care;Applying deodorant;Supervision/safety Grooming Details (indicate cue type and reason): completed sitting at sink Upper Body Bathing: Supervision/ safety;Sitting Upper Body Bathing Details (indicate cue type and reason): completed sitting at sink Lower Body Bathing: Moderate assistance;Sitting/lateral leans   Upper Body Dressing : Minimal assistance;Sitting Upper Body Dressing Details (indicate cue type and reason): to don gown Lower Body Dressing: Moderate assistance;Sitting/lateral leans;Sit to/from stand   Toilet Transfer: Min guard;Rolling walker (2 wheels);Ambulation;Regular Toilet                   Vision Baseline Vision/History: 4 Cataracts Ability to See in Adequate Light: 0 Adequate Patient Visual Report: [id number]  of vision Vision Assessment?: Vision impaired- to be further tested in functional context;Yes Eye  Alignment: Within Functional Limits Alignment/Gaze Preference: Within Defined Limits Additional Comments: reports blurring of vision that worsens when gripping with L hand/use of RW along wiht L sided facial and UE/LE numbness, pt also noting blurriness of L eye at rest, able to read paragraph accurately with L eye occluded, [facility] for functional ADL tasks during session.     Perception     Praxis      Pertinent Vitals/Pain Pain Assessment Pain Assessment: Faces Pain Score: 3  Faces Pain Scale: Hurts little more Pain Location: headacheand LLE numbness Pain Descriptors / Indicators: Headache Pain Intervention(s): Limited activity within patient's tolerance, Monitored during session     Hand Dominance Right   Extremity/Trunk Assessment Upper Extremity Assessment Upper Extremity Assessment: LUE deficits/detail LUE Deficits / Details: shoulder ROM ~100*, grip strength 3+/5 LUE Sensation: decreased light touch;decreased proprioception LUE Coordination: decreased fine motor;decreased gross motor   Lower Extremity Assessment Lower Extremity Assessment: Defer to PT evaluation   Cervical / Trunk Assessment Cervical / Trunk Assessment: Normal   Communication Communication Communication: No difficulties   Cognition Arousal/Alertness: Awake/alert Behavior During Therapy: WFL for tasks assessed/performed Overall Cognitive Status: Within Functional Limits for tasks assessed                                       General Comments  BP WNL    Exercises     Shoulder Instructions      Home Living Family/patient expects to be discharged to:: Private residence Living Arrangements: Children (daughter) Available Help at Discharge: Family;Available PRN/intermittently Type of Home: House Home Access: Stairs to enter [employer] of Steps: 1 Entrance Stairs-Rails: None Home Layout: Able to live on main level with bedroom/bathroom     Bathroom Shower/Tub:  Teacher, early years/pre: Standard     Home Equipment: Conservation officer, nature (2 wheels);Rollator (4 wheels)          Prior Functioning/Environment Prior Level of Function : Independent/Modified Independent             Mobility Comments: no AD use ADLs Comments: driving, does IADLs        OT Problem List: Decreased range of motion;Decreased activity tolerance;Impaired balance (sitting and/or standing);Decreased strength;Decreased safety awareness;Impaired vision/perception;Impaired sensation;Impaired UE functional use      OT Treatment/Interventions: Self-care/ADL training;Therapeutic exercise;Energy conservation;DME and/or AE instruction;Balance training;Patient/family education;Therapeutic activities;Visual/perceptual remediation/compensation    OT Goals(Current goals can be found in the care plan section) Acute Rehab OT Goals Patient Stated Goal: to get better OT Goal Formulation: With patient Time For Goal Achievement: 11/21/21 Potential to Achieve Goals: Good  OT Frequency: Min 2X/week    Co-evaluation              AM-PAC OT "6 Clicks" Daily Activity     Outcome Measure Help from another person eating meals?: None Help from another person taking care of personal grooming?: A Little Help from another person toileting, which includes using toliet, bedpan, or urinal?: A Little Help from another person bathing (including washing, rinsing, drying)?: A Lot Help from another person to put on and taking off regular upper body clothing?: A Little Help from another person to put on and taking off regular lower body clothing?: A Lot 6 Click Score: 17   End of Session Equipment Utilized During Treatment: Rolling walker (2 wheels);Gait belt  Nurse Communication: Mobility status  Activity Tolerance: Patient limited by pain Patient left: in chair;with call bell/phone within reach;with chair alarm set  OT Visit Diagnosis: Other abnormalities of gait and mobility  (R26.89);Unsteadiness on feet (R26.81);Muscle weakness (generalized) (M62.81)                Time: 4481-8563 OT Time Calculation (min): 39 min Charges:  OT General Charges $OT Visit: 1 Visit OT Evaluation $OT Eval Moderate Complexity: 1 Mod OT Treatments $Self Care/Home Management : 23-37 mins  Lynnda Child, OTD, OTR/L Acute Rehab [phone] - [place] 11/07/2021, 11:00 AM

## 2021-11-07 NOTE — Progress Notes (Signed)
Progress Note   Patient: Devin White MGQ:[id number] DOB: [birth year] DOA: 10/26/2021     11 DOS: the patient was seen and examined on 11/07/2021   Brief hospital course: 65yo with hx liver transplant, HTN, BPH, GERD presenting with N/V/D. He was recently discharged from [facility] for recurrent UTI. He was finishing a course of abx and felt nauseous each time he took the medication (ceftin). He began having diarrhea as well. He reports over 30 episodes of diarrhea since initiating the oral antibiotic. He has also had episodes of vomiting. This morning he felt extremely week and had a fever of 101. So he decided to come to the ED for evaluation. He denies any other aggravating or alleviating factors.   Assessment and Plan: No notes have been filed under this hospital service. Service: Hospitalist C diff colitis: Completed the course of oral vancomycin.  Diarrhea is improving. Formed stool.      Bilateral Renal cysts:  -recommend outpatient follow up with Urology.      TIA:  With left sided residual paraesthesias. Differential include complicated migraine and Autonomic cephalgia CTA of the head and neck does not show any significant stenosis.  MRI negative for acute or sub acute infarct.  Echocardiogram is pending.  Hemoglobin A1c is 6.2% LDL is 69 Started the patient on aspirin 325 mg daily, neurology added a trial of topomax and imitrex .  EEG done today as he had another episode of numbness on the left side of the face. No syncope . EEG is negative for seizures.  Therapy evaluations recommends Home health PT. He was given a dose of Imitrex and his symptoms improved. He was discharged on oral imitrex 100 mg  tab prn. may repeat x 1 in 24 hrs not more than 2 days per week. total 9 tablets. also Topmamax 50 mg bid. F/u with Dr Leonie Man in 2 months         Hypertension;  Permissive hypertension. Currently on amlodipine and prn hydralazine.      Hyperlipidemia:  LDL is 69 Started  him on crestor 5 mg daily.      Body mass index is 35.84 kg/m. OBESITY;  Recommend outpatient follow up with PCP for weight loss.        H/o Liver Transplant:  On tacrolimus and mycophenolate.      Leukocytosis:  possibly from c diff diarrhea and colitis.  Resolved.      Mild normocytic anemia:  Continue to monitor.      Gouty flare up  Resolved.  Colchicine held for AKI.        AKI:  - Baseline creatinine around 1.4.  - creatinine increased to 1.7 -Cr improved with IVF, noted to be voiding well        Subjective: Eager to go home  Physical Exam: Vitals:   11/07/21 0336 11/07/21 0500 11/07/21 0828 11/07/21 1209  BP: 132/77  (!) 137/96 114/76  Pulse: 70  70 86  Resp: 15   16  Temp: 98 F (36.7 C)  97.9 F (36.6 C) 97.7 F (36.5 C)  TempSrc: Oral  Oral Oral  SpO2: 96%  98% 97%  Weight:  114.5 kg    Height:       General exam: Awake, laying in bed, in nad Respiratory system: Normal respiratory effort, no wheezing Cardiovascular system: perfused, no notable jvd Gastrointestinal system: Soft, nondistended, positive BS Central nervous system: CN2-12 grossly intact, strength intact Extremities: Perfused, no clubbing Skin: Normal skin turgor, no  notable skin lesions seen Psychiatry: Mood normal // no visual hallucinations  Data Reviewed:   Family Communication: Pt in room  D Author: Marylu Lund, MD 11/07/2021 12:35 PM  For on call review [email or web address].

## 2021-11-07 NOTE — Consult Note (Signed)
[facility] Inpatient Consult   11/07/2021  Devin White August 11, 1955 [id number]  [place] Management [facility] CM)   Patient chart has been reviewed with noted high risk score for unplanned readmissions.  Patient assessed for community [place] Management follow up needs. Per review, patient's primary provider office offers embedded chronic care coordination services. Per review, patient is followed by by CCM team at primary office. PCP office listed for post hospital transition of care calls and follow up.   Of note, [facility] Care Management services does not replace or interfere with any services that are arranged by inpatient case management or social work.    Netta Cedars, MSN, RN [facility] [facility] Liaison Toll free office [phone]

## 2021-11-08 ENCOUNTER — Telehealth: Payer: Self-pay

## 2021-11-08 NOTE — Telephone Encounter (Signed)
Transition Care Management Follow-up Telephone Call Date of discharge and from where: [facility] 11-07-21 Dx: diarrhea How have you been since you were released from the hospital? Doing ok  Any questions or concerns? No  Items Reviewed: Did the pt receive and understand the discharge instructions provided? Yes  Medications obtained and verified? Yes  Other? No  Any new allergies since your discharge? No  Dietary orders reviewed? Yes Do you have support at home? Yes   Home Care and Equipment/Supplies: Were home health services ordered? Yes- PT If so, what is the name of the agency? Unsure   Has the agency set up a time to come to the patient's home? no Were any new equipment or medical supplies ordered?  No What is the name of the medical supply agency? na Were you able to get the supplies/equipment? not applicable Do you have any questions related to the use of the equipment or supplies? No  Functional Questionnaire: (I = Independent and D = Dependent) ADLs: I  Bathing/Dressing- I  Meal Prep- I  Eating- I  Maintaining continence- I  Transferring/Ambulation- I- WALKER  Managing Meds- I  Follow up appointments reviewed:  PCP Hospital f/u appt confirmed? NO- pt only wants to see Dr Silvio Pate -schedule has no openings- will request front desk to contact pt  [facility] f/u appt confirmed? No  . Are transportation arrangements needed? No  If their condition worsens, is the pt aware to call PCP or go to the Emergency Dept.? Yes Was the patient provided with contact information for the PCP's office or ED? Yes Was to pt encouraged to call back with questions or concerns? Yes

## 2021-11-08 NOTE — Telephone Encounter (Signed)
Spoke to pt. Made appt 11-14-21 at 1215.

## 2021-11-10 DIAGNOSIS — N390 Urinary tract infection, site not specified: Secondary | ICD-10-CM | POA: Diagnosis not present

## 2021-11-10 DIAGNOSIS — D631 Anemia in chronic kidney disease: Secondary | ICD-10-CM | POA: Diagnosis not present

## 2021-11-10 DIAGNOSIS — M1990 Unspecified osteoarthritis, unspecified site: Secondary | ICD-10-CM | POA: Diagnosis not present

## 2021-11-10 DIAGNOSIS — N281 Cyst of kidney, acquired: Secondary | ICD-10-CM | POA: Diagnosis not present

## 2021-11-10 DIAGNOSIS — I130 Hypertensive heart and chronic kidney disease with heart failure and stage 1 through stage 4 chronic kidney disease, or unspecified chronic kidney disease: Secondary | ICD-10-CM | POA: Diagnosis not present

## 2021-11-10 DIAGNOSIS — Z944 Liver transplant status: Secondary | ICD-10-CM | POA: Diagnosis not present

## 2021-11-10 DIAGNOSIS — I5032 Chronic diastolic (congestive) heart failure: Secondary | ICD-10-CM | POA: Diagnosis not present

## 2021-11-10 DIAGNOSIS — I10 Essential (primary) hypertension: Secondary | ICD-10-CM | POA: Diagnosis not present

## 2021-11-10 DIAGNOSIS — Z9181 History of falling: Secondary | ICD-10-CM | POA: Diagnosis not present

## 2021-11-10 DIAGNOSIS — K219 Gastro-esophageal reflux disease without esophagitis: Secondary | ICD-10-CM | POA: Diagnosis not present

## 2021-11-10 DIAGNOSIS — Z7952 Long term (current) use of systemic steroids: Secondary | ICD-10-CM | POA: Diagnosis not present

## 2021-11-10 DIAGNOSIS — Z7982 Long term (current) use of aspirin: Secondary | ICD-10-CM | POA: Diagnosis not present

## 2021-11-10 DIAGNOSIS — N1831 Chronic kidney disease, stage 3a: Secondary | ICD-10-CM | POA: Diagnosis not present

## 2021-11-10 DIAGNOSIS — M479 Spondylosis, unspecified: Secondary | ICD-10-CM | POA: Diagnosis not present

## 2021-11-10 DIAGNOSIS — M1A9XX1 Chronic gout, unspecified, with tophus (tophi): Secondary | ICD-10-CM | POA: Diagnosis not present

## 2021-11-13 DIAGNOSIS — I5032 Chronic diastolic (congestive) heart failure: Secondary | ICD-10-CM | POA: Diagnosis not present

## 2021-11-13 DIAGNOSIS — K219 Gastro-esophageal reflux disease without esophagitis: Secondary | ICD-10-CM | POA: Diagnosis not present

## 2021-11-13 DIAGNOSIS — N281 Cyst of kidney, acquired: Secondary | ICD-10-CM | POA: Diagnosis not present

## 2021-11-13 DIAGNOSIS — Z7982 Long term (current) use of aspirin: Secondary | ICD-10-CM | POA: Diagnosis not present

## 2021-11-13 DIAGNOSIS — Z944 Liver transplant status: Secondary | ICD-10-CM | POA: Diagnosis not present

## 2021-11-13 DIAGNOSIS — I130 Hypertensive heart and chronic kidney disease with heart failure and stage 1 through stage 4 chronic kidney disease, or unspecified chronic kidney disease: Secondary | ICD-10-CM | POA: Diagnosis not present

## 2021-11-13 DIAGNOSIS — Z7952 Long term (current) use of systemic steroids: Secondary | ICD-10-CM | POA: Diagnosis not present

## 2021-11-13 DIAGNOSIS — M1A9XX1 Chronic gout, unspecified, with tophus (tophi): Secondary | ICD-10-CM | POA: Diagnosis not present

## 2021-11-13 DIAGNOSIS — M1990 Unspecified osteoarthritis, unspecified site: Secondary | ICD-10-CM | POA: Diagnosis not present

## 2021-11-13 DIAGNOSIS — N390 Urinary tract infection, site not specified: Secondary | ICD-10-CM | POA: Diagnosis not present

## 2021-11-13 DIAGNOSIS — M479 Spondylosis, unspecified: Secondary | ICD-10-CM | POA: Diagnosis not present

## 2021-11-13 DIAGNOSIS — I10 Essential (primary) hypertension: Secondary | ICD-10-CM | POA: Diagnosis not present

## 2021-11-13 DIAGNOSIS — N1831 Chronic kidney disease, stage 3a: Secondary | ICD-10-CM | POA: Diagnosis not present

## 2021-11-13 DIAGNOSIS — Z9181 History of falling: Secondary | ICD-10-CM | POA: Diagnosis not present

## 2021-11-13 DIAGNOSIS — D631 Anemia in chronic kidney disease: Secondary | ICD-10-CM | POA: Diagnosis not present

## 2021-11-14 ENCOUNTER — Encounter: Payer: Self-pay | Admitting: Internal Medicine

## 2021-11-14 ENCOUNTER — Telehealth (INDEPENDENT_AMBULATORY_CARE_PROVIDER_SITE_OTHER): Payer: Medicare Other | Admitting: Internal Medicine

## 2021-11-14 DIAGNOSIS — I10 Essential (primary) hypertension: Secondary | ICD-10-CM

## 2021-11-14 DIAGNOSIS — N390 Urinary tract infection, site not specified: Secondary | ICD-10-CM | POA: Diagnosis not present

## 2021-11-14 DIAGNOSIS — A0472 Enterocolitis due to Clostridium difficile, not specified as recurrent: Secondary | ICD-10-CM | POA: Insufficient documentation

## 2021-11-14 DIAGNOSIS — R200 Anesthesia of skin: Secondary | ICD-10-CM | POA: Insufficient documentation

## 2021-11-14 NOTE — Telephone Encounter (Signed)
[place] Name: [place] Agency Name: [facility] Phone #: [phone] Direct line and password protected VM Service Requested: Speech Therapy eval and for PT (examples: OT/PT/Skilled Nursing/Social Work/Speech Therapy/Wound Care) Frequency of Visits: PT: 1 time a week for 8 weeks. Speech Therapy is just for eval and will call back with frequency

## 2021-11-14 NOTE — Assessment & Plan Note (Signed)
Unusual syndrome but no MRI evidence of stroke Ongoing symptoms persist No seizure No clear headache--but still thought to be a possible migraine syndrome Taking the topiramate '50mg'$  bid (but symptoms persist) Will contact Dr Leonie Man to make sure he has outpatient follow up

## 2021-11-14 NOTE — Assessment & Plan Note (Signed)
Will need to use most limited spectrum antibiotics for shortest possible course if this recurs

## 2021-11-14 NOTE — Telephone Encounter (Signed)
Verbal orders left on Vm

## 2021-11-14 NOTE — Assessment & Plan Note (Signed)
BP Readings from Last 3 Encounters:  11/14/21 128/78  11/07/21 114/76  10/12/21 104/70   Had been high at times in hospital but better now On amlodipine 5 and lisinopril '5mg'$  daily Hasn't needed the furosemide

## 2021-11-14 NOTE — Progress Notes (Signed)
Subjective:    Patient ID: Devin White, male    DOB: 01-31-1956, 66 y.o.   MRN: [id number]  HPI Video virtual visit for hospital follow up Identification done Reviewed limitations and billing and he gave consent Participants---patient in his home and I am in my office  Was hospitalized at [facility]---antibiotics for UTI Developed abdominal pain and severe diarrhea within 2 days (so sick he needed to call EMS) At [facility] on vancomycin.   While there, he sat down in bathroom to shave (about a week in) Had numbness on entire left side of body---bad pain around shoulder Had emergency CT scan--no clear stroke Then recurrent episodes and BP high Was then sent to [facility] for neurology evaluation By the time he got over there, another episode occurred and RN came in BP spiking high---got doctor and eye/jaw was twitching Called neurologist---and got shots  BP came down after episode Topiramate started for possible migraine syndrome ('50mg'$  bid) Head MRI negative Normal EEG  Still has left ear tingling Ongoing tingling/numbness/burning on left side Some blurry vision on left  Loose stools have resolved "My stomach feels better than it has in years" Bowels back to normal Appetite is still variable---trying to eat more healthy (still not great)  Current Outpatient Medications on File Prior to Visit  Medication Sig Dispense Refill   acetaminophen (TYLENOL) 325 MG tablet Take 650 mg by mouth every 6 (six) hours as needed for mild pain.     amLODipine (NORVASC) 5 MG tablet Take 5 mg by mouth.     ascorbic acid (VITAMIN C) 500 MG tablet Take 1 tablet (500 mg total) by mouth daily.     aspirin 325 MG tablet Take 1 tablet (325 mg total) by mouth daily. 30 tablet 3   calcium carbonate (TUMS) 500 MG chewable tablet Chew 1 tablet (200 mg of elemental calcium total) by mouth 2 (two) times daily as needed for indigestion or heartburn. 180 tablet 1   colchicine 0.6 MG tablet Take 1  tablet (0.6 mg total) by mouth daily as needed. 90 tablet 3   diclofenac Sodium (VOLTAREN) 1 % GEL Apply 4 g topically 4 (four) times daily. (Patient taking differently: Apply 4 g topically 2 (two) times daily as needed (pain).) 100 g 5   feeding supplement, GLUCERNA SHAKE, (GLUCERNA SHAKE) LIQD Take 237 mLs by mouth 2 (two) times daily between meals. 14220 mL 1   furosemide (LASIX) 40 MG tablet Take 1 tablet (40 mg total) by mouth daily. 90 tablet 3   lisinopril (ZESTRIL) 5 MG tablet Take 5 mg by mouth daily.     Multiple Vitamin (MULTIVITAMIN WITH MINERALS) TABS tablet Take 1 tablet by mouth daily.     mycophenolate (CELLCEPT) 250 MG capsule Take 250-500 mg by mouth 2 (two) times daily. 2 capsules (500 mg) in the morning and one capsule (250 mg) in the evening     omeprazole (PRILOSEC) 20 MG capsule TAKE 1 CAPSULE BY MOUTH TWICE DAILY (Patient taking differently: daily.) 180 capsule 3   predniSONE (DELTASONE) 10 MG tablet Take 10 mg by mouth daily with breakfast.     rosuvastatin (CRESTOR) 5 MG tablet Take 1 tablet (5 mg total) by mouth every evening. 30 tablet 0   SUMAtriptan (IMITREX) 100 MG tablet Take 1 tablet (100 mg total) by mouth once as needed for migraine (may repeat x 1 in 24 hrs not more than 2 days per week. total 9 tablets). May repeat in 2 hours if headache  persists or recurs. 9 tablet 0   tacrolimus (PROGRAF) 0.5 MG capsule Take 0.5-1 mg by mouth See admin instructions. Taking 0.5 mg in the morning and 1 mg at bedtime     tamsulosin (FLOMAX) 0.4 MG CAPS capsule Take 1 capsule (0.4 mg total) by mouth daily. 90 capsule 3   topiramate (TOPAMAX) 50 MG tablet Take 1 tablet (50 mg total) by mouth 2 (two) times daily. 60 tablet 1   vitamin A 3 MG (10000 UNITS) capsule Take 1 capsule (10,000 Units total) by mouth daily. 30 capsule    zinc sulfate 220 (50 Zn) MG capsule Take 1 capsule (220 mg total) by mouth daily.     No current facility-administered medications on file prior to visit.     No Known Allergies  Past Medical History:  Diagnosis Date   Arthritis    back and legs   Benign prostatic hypertrophy    GERD (gastroesophageal reflux disease)    Gout    History of blood transfusion    pt has antibodies in his blood since previous transfusions   History of cirrhosis of liver S/P TRANSPLANT 2009   History of liver failure S/P TRANSPLANT   Hypertension    Left ureteral calculus     Past Surgical History:  Procedure Laterality Date   APPENDECTOMY     bone morrow biopsy     CHOLECYSTECTOMY  2007   CYSTO/ LEFT RETROGRADE PYELOGRAM/ LEFT URETERAL STENT PLACEMENT  03-05-2012  DR Tresa Moore [facility])   LEFT URETERAL CALCULI   CYSTOSCOPY W/ URETERAL STENT PLACEMENT  03/11/2012   Procedure: CYSTOSCOPY WITH STENT REPLACEMENT;  Surgeon: Alexis Frock, MD;  Location: [facility];  Service: Urology;  Laterality: Left;   HERNIA REPAIR  2008   LIVER BIOPSY     LIVER TRANSPLANT  11/24/2007   pt states doing well since liver transplant   LUMBAR DISC SURGERY     LUMBAR FUSION     removal of fistula     URETEROSCOPY  03/11/2012   Procedure: URETEROSCOPY;  Surgeon: Alexis Frock, MD;  Location: [facility];  Service: Urology;  Laterality: Left;   STONE MANIPULATION, stone obtained  [place] [facility]    Family History  Problem Relation Age of Onset   Cancer Mother        colon   Arthritis Mother    Vasculitis Mother    Hypertension Mother    Cancer Father        colon   Kidney disease Father    Arthritis Father    Arthritis Brother    Alcohol abuse Maternal Aunt    Diabetes Maternal Aunt    Alcohol abuse Maternal Uncle    Diabetes Paternal Aunt    Alcohol abuse Maternal Uncle    Alcohol abuse Maternal Uncle     Social History   Socioeconomic History   Marital status: Single    Spouse name: Not on file   Number of children: 3   Years of education: Not on file   Highest education level: Not on file  Occupational History    Occupation: disabled    Employer: RETIRED  Tobacco Use   Smoking status: Never    Passive exposure: Past   Smokeless tobacco: Never  Substance and Sexual Activity   Alcohol use: No   Drug use: No   Sexual activity: Not on file  Other Topics Concern   Not on file  Social History Narrative   Has living will  Requests daughter [name] as his health care POA   Would accept brief attempt at resuscitation but no prolonged ventilation   No tube feeds if cognitively unaware   Social Determinants of Health   Financial Resource Strain: Low Risk    Difficulty of Paying Living Expenses: Not very hard  Food Insecurity: Not on file  Transportation Needs: Not on file  Physical Activity: Not on file  Stress: Not on file  Social Connections: Not on file  Intimate Partner Violence: Not on file   Review of Systems Tinnitus in left ear is disturbing--affects his sleep at times Has had PT come to home    Objective:   Physical Exam Constitutional:      Appearance: Normal appearance.  Pulmonary:     Effort: No respiratory distress.  Neurological:     Mental Status: He is alert.     Comments: Face is symmetric  No focal weakness           Assessment & Plan:

## 2021-11-14 NOTE — Assessment & Plan Note (Signed)
Now resolved since treatment with vancomycin If recurs, will give more prolonged course with vanco

## 2021-11-15 ENCOUNTER — Telehealth: Payer: Self-pay

## 2021-11-15 DIAGNOSIS — M479 Spondylosis, unspecified: Secondary | ICD-10-CM | POA: Diagnosis not present

## 2021-11-15 DIAGNOSIS — Z7982 Long term (current) use of aspirin: Secondary | ICD-10-CM | POA: Diagnosis not present

## 2021-11-15 DIAGNOSIS — M1990 Unspecified osteoarthritis, unspecified site: Secondary | ICD-10-CM | POA: Diagnosis not present

## 2021-11-15 DIAGNOSIS — N1831 Chronic kidney disease, stage 3a: Secondary | ICD-10-CM | POA: Diagnosis not present

## 2021-11-15 DIAGNOSIS — Z7952 Long term (current) use of systemic steroids: Secondary | ICD-10-CM | POA: Diagnosis not present

## 2021-11-15 DIAGNOSIS — N390 Urinary tract infection, site not specified: Secondary | ICD-10-CM | POA: Diagnosis not present

## 2021-11-15 DIAGNOSIS — I130 Hypertensive heart and chronic kidney disease with heart failure and stage 1 through stage 4 chronic kidney disease, or unspecified chronic kidney disease: Secondary | ICD-10-CM | POA: Diagnosis not present

## 2021-11-15 DIAGNOSIS — I5032 Chronic diastolic (congestive) heart failure: Secondary | ICD-10-CM | POA: Diagnosis not present

## 2021-11-15 DIAGNOSIS — I10 Essential (primary) hypertension: Secondary | ICD-10-CM | POA: Diagnosis not present

## 2021-11-15 DIAGNOSIS — K219 Gastro-esophageal reflux disease without esophagitis: Secondary | ICD-10-CM | POA: Diagnosis not present

## 2021-11-15 DIAGNOSIS — Z944 Liver transplant status: Secondary | ICD-10-CM | POA: Diagnosis not present

## 2021-11-15 DIAGNOSIS — Z9181 History of falling: Secondary | ICD-10-CM | POA: Diagnosis not present

## 2021-11-15 DIAGNOSIS — N281 Cyst of kidney, acquired: Secondary | ICD-10-CM | POA: Diagnosis not present

## 2021-11-15 DIAGNOSIS — D631 Anemia in chronic kidney disease: Secondary | ICD-10-CM | POA: Diagnosis not present

## 2021-11-15 DIAGNOSIS — M1A9XX1 Chronic gout, unspecified, with tophus (tophi): Secondary | ICD-10-CM | POA: Diagnosis not present

## 2021-11-15 NOTE — Telephone Encounter (Signed)
Devin White is calling in from [facility], reporting high blood pressure readings. Reading 178/82 -- headache and SOB, took BP medication about 9 am this morning.

## 2021-11-15 NOTE — Telephone Encounter (Signed)
Called and spoke to pt. He said he still has a slight headache and shortness of breath. Some left arm pain. Checked it while I was on the phone with him and it was 150/90

## 2021-11-16 DIAGNOSIS — N400 Enlarged prostate without lower urinary tract symptoms: Secondary | ICD-10-CM

## 2021-11-16 DIAGNOSIS — M1A9XX1 Chronic gout, unspecified, with tophus (tophi): Secondary | ICD-10-CM

## 2021-11-16 DIAGNOSIS — Z6835 Body mass index (BMI) 35.0-35.9, adult: Secondary | ICD-10-CM

## 2021-11-16 DIAGNOSIS — I10 Essential (primary) hypertension: Secondary | ICD-10-CM

## 2021-11-16 DIAGNOSIS — N281 Cyst of kidney, acquired: Secondary | ICD-10-CM

## 2021-11-16 DIAGNOSIS — Z7982 Long term (current) use of aspirin: Secondary | ICD-10-CM

## 2021-11-16 DIAGNOSIS — M479 Spondylosis, unspecified: Secondary | ICD-10-CM

## 2021-11-16 DIAGNOSIS — Z9181 History of falling: Secondary | ICD-10-CM

## 2021-11-16 DIAGNOSIS — I130 Hypertensive heart and chronic kidney disease with heart failure and stage 1 through stage 4 chronic kidney disease, or unspecified chronic kidney disease: Secondary | ICD-10-CM

## 2021-11-16 DIAGNOSIS — E669 Obesity, unspecified: Secondary | ICD-10-CM

## 2021-11-16 DIAGNOSIS — A0472 Enterocolitis due to Clostridium difficile, not specified as recurrent: Secondary | ICD-10-CM

## 2021-11-16 DIAGNOSIS — Z7952 Long term (current) use of systemic steroids: Secondary | ICD-10-CM

## 2021-11-16 DIAGNOSIS — Z944 Liver transplant status: Secondary | ICD-10-CM

## 2021-11-16 DIAGNOSIS — N1831 Chronic kidney disease, stage 3a: Secondary | ICD-10-CM

## 2021-11-16 DIAGNOSIS — K219 Gastro-esophageal reflux disease without esophagitis: Secondary | ICD-10-CM

## 2021-11-16 DIAGNOSIS — N390 Urinary tract infection, site not specified: Secondary | ICD-10-CM

## 2021-11-16 DIAGNOSIS — M1990 Unspecified osteoarthritis, unspecified site: Secondary | ICD-10-CM

## 2021-11-16 DIAGNOSIS — I5032 Chronic diastolic (congestive) heart failure: Secondary | ICD-10-CM

## 2021-11-16 DIAGNOSIS — D631 Anemia in chronic kidney disease: Secondary | ICD-10-CM

## 2021-11-16 NOTE — Telephone Encounter (Signed)
Spoke to pt. He said he took a furosemide and said he feels a lot better. 149/89 when he woke up. Now 130/80.

## 2021-11-16 NOTE — Telephone Encounter (Signed)
Good to hear

## 2021-11-21 ENCOUNTER — Telehealth: Payer: Self-pay

## 2021-11-21 NOTE — Telephone Encounter (Signed)
Sonia Baller from [place] called to notify us that patient canceled all appointments this week as he is out of town.

## 2021-11-28 DIAGNOSIS — K219 Gastro-esophageal reflux disease without esophagitis: Secondary | ICD-10-CM | POA: Diagnosis not present

## 2021-11-28 DIAGNOSIS — N390 Urinary tract infection, site not specified: Secondary | ICD-10-CM | POA: Diagnosis not present

## 2021-11-28 DIAGNOSIS — M1990 Unspecified osteoarthritis, unspecified site: Secondary | ICD-10-CM | POA: Diagnosis not present

## 2021-11-28 DIAGNOSIS — Z7982 Long term (current) use of aspirin: Secondary | ICD-10-CM | POA: Diagnosis not present

## 2021-11-28 DIAGNOSIS — D631 Anemia in chronic kidney disease: Secondary | ICD-10-CM | POA: Diagnosis not present

## 2021-11-28 DIAGNOSIS — I5032 Chronic diastolic (congestive) heart failure: Secondary | ICD-10-CM | POA: Diagnosis not present

## 2021-11-28 DIAGNOSIS — Z944 Liver transplant status: Secondary | ICD-10-CM | POA: Diagnosis not present

## 2021-11-28 DIAGNOSIS — M1A9XX1 Chronic gout, unspecified, with tophus (tophi): Secondary | ICD-10-CM | POA: Diagnosis not present

## 2021-11-28 DIAGNOSIS — N281 Cyst of kidney, acquired: Secondary | ICD-10-CM | POA: Diagnosis not present

## 2021-11-28 DIAGNOSIS — M479 Spondylosis, unspecified: Secondary | ICD-10-CM | POA: Diagnosis not present

## 2021-11-28 DIAGNOSIS — I130 Hypertensive heart and chronic kidney disease with heart failure and stage 1 through stage 4 chronic kidney disease, or unspecified chronic kidney disease: Secondary | ICD-10-CM | POA: Diagnosis not present

## 2021-11-28 DIAGNOSIS — Z7952 Long term (current) use of systemic steroids: Secondary | ICD-10-CM | POA: Diagnosis not present

## 2021-11-28 DIAGNOSIS — I10 Essential (primary) hypertension: Secondary | ICD-10-CM | POA: Diagnosis not present

## 2021-11-28 DIAGNOSIS — N1831 Chronic kidney disease, stage 3a: Secondary | ICD-10-CM | POA: Diagnosis not present

## 2021-11-28 DIAGNOSIS — Z9181 History of falling: Secondary | ICD-10-CM | POA: Diagnosis not present

## 2021-12-05 DIAGNOSIS — Z7952 Long term (current) use of systemic steroids: Secondary | ICD-10-CM | POA: Diagnosis not present

## 2021-12-05 DIAGNOSIS — N1831 Chronic kidney disease, stage 3a: Secondary | ICD-10-CM | POA: Diagnosis not present

## 2021-12-05 DIAGNOSIS — M1990 Unspecified osteoarthritis, unspecified site: Secondary | ICD-10-CM | POA: Diagnosis not present

## 2021-12-05 DIAGNOSIS — Z944 Liver transplant status: Secondary | ICD-10-CM | POA: Diagnosis not present

## 2021-12-05 DIAGNOSIS — D631 Anemia in chronic kidney disease: Secondary | ICD-10-CM | POA: Diagnosis not present

## 2021-12-05 DIAGNOSIS — N281 Cyst of kidney, acquired: Secondary | ICD-10-CM | POA: Diagnosis not present

## 2021-12-05 DIAGNOSIS — N390 Urinary tract infection, site not specified: Secondary | ICD-10-CM | POA: Diagnosis not present

## 2021-12-05 DIAGNOSIS — Z9181 History of falling: Secondary | ICD-10-CM | POA: Diagnosis not present

## 2021-12-05 DIAGNOSIS — M479 Spondylosis, unspecified: Secondary | ICD-10-CM | POA: Diagnosis not present

## 2021-12-05 DIAGNOSIS — I10 Essential (primary) hypertension: Secondary | ICD-10-CM | POA: Diagnosis not present

## 2021-12-05 DIAGNOSIS — Z7982 Long term (current) use of aspirin: Secondary | ICD-10-CM | POA: Diagnosis not present

## 2021-12-05 DIAGNOSIS — M1A9XX1 Chronic gout, unspecified, with tophus (tophi): Secondary | ICD-10-CM | POA: Diagnosis not present

## 2021-12-05 DIAGNOSIS — I5032 Chronic diastolic (congestive) heart failure: Secondary | ICD-10-CM | POA: Diagnosis not present

## 2021-12-05 DIAGNOSIS — I130 Hypertensive heart and chronic kidney disease with heart failure and stage 1 through stage 4 chronic kidney disease, or unspecified chronic kidney disease: Secondary | ICD-10-CM | POA: Diagnosis not present

## 2021-12-05 DIAGNOSIS — K219 Gastro-esophageal reflux disease without esophagitis: Secondary | ICD-10-CM | POA: Diagnosis not present

## 2021-12-06 ENCOUNTER — Telehealth: Payer: Self-pay | Admitting: Internal Medicine

## 2021-12-06 DIAGNOSIS — I10 Essential (primary) hypertension: Secondary | ICD-10-CM | POA: Diagnosis not present

## 2021-12-06 DIAGNOSIS — K219 Gastro-esophageal reflux disease without esophagitis: Secondary | ICD-10-CM | POA: Diagnosis not present

## 2021-12-06 DIAGNOSIS — M479 Spondylosis, unspecified: Secondary | ICD-10-CM | POA: Diagnosis not present

## 2021-12-06 DIAGNOSIS — Z944 Liver transplant status: Secondary | ICD-10-CM | POA: Diagnosis not present

## 2021-12-06 DIAGNOSIS — Z9181 History of falling: Secondary | ICD-10-CM | POA: Diagnosis not present

## 2021-12-06 DIAGNOSIS — M1A9XX1 Chronic gout, unspecified, with tophus (tophi): Secondary | ICD-10-CM | POA: Diagnosis not present

## 2021-12-06 DIAGNOSIS — I5032 Chronic diastolic (congestive) heart failure: Secondary | ICD-10-CM | POA: Diagnosis not present

## 2021-12-06 DIAGNOSIS — I130 Hypertensive heart and chronic kidney disease with heart failure and stage 1 through stage 4 chronic kidney disease, or unspecified chronic kidney disease: Secondary | ICD-10-CM | POA: Diagnosis not present

## 2021-12-06 DIAGNOSIS — Z7982 Long term (current) use of aspirin: Secondary | ICD-10-CM | POA: Diagnosis not present

## 2021-12-06 DIAGNOSIS — N281 Cyst of kidney, acquired: Secondary | ICD-10-CM | POA: Diagnosis not present

## 2021-12-06 DIAGNOSIS — D631 Anemia in chronic kidney disease: Secondary | ICD-10-CM | POA: Diagnosis not present

## 2021-12-06 DIAGNOSIS — N1831 Chronic kidney disease, stage 3a: Secondary | ICD-10-CM | POA: Diagnosis not present

## 2021-12-06 DIAGNOSIS — Z7952 Long term (current) use of systemic steroids: Secondary | ICD-10-CM | POA: Diagnosis not present

## 2021-12-06 DIAGNOSIS — M1990 Unspecified osteoarthritis, unspecified site: Secondary | ICD-10-CM | POA: Diagnosis not present

## 2021-12-06 DIAGNOSIS — N390 Urinary tract infection, site not specified: Secondary | ICD-10-CM | POA: Diagnosis not present

## 2021-12-06 NOTE — Telephone Encounter (Signed)
Lattie Haw with [facility] called to give some information to Dr. Silvio Pate nurse, she requested a call back at [phone] secure.

## 2021-12-06 NOTE — Telephone Encounter (Signed)
Lattie Haw, RN with [facility] was speaking with the pt if he had any issues that needed to be addressed. He mentioned he gets short of breath when moving around. Mentioned dry cough a few times a day. She noticed he was on lisinopril and mentioned that it can cause a cough. After that discussion, he started coughing more. She is sending him info on CHF to help him stabilize. She said she will be faxing this report to [place].

## 2021-12-12 DIAGNOSIS — D631 Anemia in chronic kidney disease: Secondary | ICD-10-CM | POA: Diagnosis not present

## 2021-12-12 DIAGNOSIS — I5032 Chronic diastolic (congestive) heart failure: Secondary | ICD-10-CM | POA: Diagnosis not present

## 2021-12-12 DIAGNOSIS — N1831 Chronic kidney disease, stage 3a: Secondary | ICD-10-CM | POA: Diagnosis not present

## 2021-12-12 DIAGNOSIS — Z944 Liver transplant status: Secondary | ICD-10-CM | POA: Diagnosis not present

## 2021-12-12 DIAGNOSIS — M479 Spondylosis, unspecified: Secondary | ICD-10-CM | POA: Diagnosis not present

## 2021-12-12 DIAGNOSIS — Z7952 Long term (current) use of systemic steroids: Secondary | ICD-10-CM | POA: Diagnosis not present

## 2021-12-12 DIAGNOSIS — N281 Cyst of kidney, acquired: Secondary | ICD-10-CM | POA: Diagnosis not present

## 2021-12-12 DIAGNOSIS — N390 Urinary tract infection, site not specified: Secondary | ICD-10-CM | POA: Diagnosis not present

## 2021-12-12 DIAGNOSIS — Z9181 History of falling: Secondary | ICD-10-CM | POA: Diagnosis not present

## 2021-12-12 DIAGNOSIS — M1990 Unspecified osteoarthritis, unspecified site: Secondary | ICD-10-CM | POA: Diagnosis not present

## 2021-12-12 DIAGNOSIS — I130 Hypertensive heart and chronic kidney disease with heart failure and stage 1 through stage 4 chronic kidney disease, or unspecified chronic kidney disease: Secondary | ICD-10-CM | POA: Diagnosis not present

## 2021-12-12 DIAGNOSIS — I10 Essential (primary) hypertension: Secondary | ICD-10-CM | POA: Diagnosis not present

## 2021-12-12 DIAGNOSIS — M1A9XX1 Chronic gout, unspecified, with tophus (tophi): Secondary | ICD-10-CM | POA: Diagnosis not present

## 2021-12-12 DIAGNOSIS — K219 Gastro-esophageal reflux disease without esophagitis: Secondary | ICD-10-CM | POA: Diagnosis not present

## 2021-12-12 DIAGNOSIS — Z7982 Long term (current) use of aspirin: Secondary | ICD-10-CM | POA: Diagnosis not present

## 2021-12-13 DIAGNOSIS — M1A9XX1 Chronic gout, unspecified, with tophus (tophi): Secondary | ICD-10-CM | POA: Diagnosis not present

## 2021-12-13 DIAGNOSIS — Z7952 Long term (current) use of systemic steroids: Secondary | ICD-10-CM | POA: Diagnosis not present

## 2021-12-13 DIAGNOSIS — Z7982 Long term (current) use of aspirin: Secondary | ICD-10-CM | POA: Diagnosis not present

## 2021-12-13 DIAGNOSIS — N281 Cyst of kidney, acquired: Secondary | ICD-10-CM | POA: Diagnosis not present

## 2021-12-13 DIAGNOSIS — Z944 Liver transplant status: Secondary | ICD-10-CM | POA: Diagnosis not present

## 2021-12-13 DIAGNOSIS — N1831 Chronic kidney disease, stage 3a: Secondary | ICD-10-CM | POA: Diagnosis not present

## 2021-12-13 DIAGNOSIS — K219 Gastro-esophageal reflux disease without esophagitis: Secondary | ICD-10-CM | POA: Diagnosis not present

## 2021-12-13 DIAGNOSIS — M1990 Unspecified osteoarthritis, unspecified site: Secondary | ICD-10-CM | POA: Diagnosis not present

## 2021-12-13 DIAGNOSIS — D631 Anemia in chronic kidney disease: Secondary | ICD-10-CM | POA: Diagnosis not present

## 2021-12-13 DIAGNOSIS — Z9181 History of falling: Secondary | ICD-10-CM | POA: Diagnosis not present

## 2021-12-13 DIAGNOSIS — M479 Spondylosis, unspecified: Secondary | ICD-10-CM | POA: Diagnosis not present

## 2021-12-13 DIAGNOSIS — I10 Essential (primary) hypertension: Secondary | ICD-10-CM | POA: Diagnosis not present

## 2021-12-13 DIAGNOSIS — I5032 Chronic diastolic (congestive) heart failure: Secondary | ICD-10-CM | POA: Diagnosis not present

## 2021-12-13 DIAGNOSIS — N390 Urinary tract infection, site not specified: Secondary | ICD-10-CM | POA: Diagnosis not present

## 2021-12-13 DIAGNOSIS — I130 Hypertensive heart and chronic kidney disease with heart failure and stage 1 through stage 4 chronic kidney disease, or unspecified chronic kidney disease: Secondary | ICD-10-CM | POA: Diagnosis not present

## 2021-12-16 ENCOUNTER — Other Ambulatory Visit: Payer: Self-pay | Admitting: Internal Medicine

## 2021-12-18 DIAGNOSIS — N390 Urinary tract infection, site not specified: Secondary | ICD-10-CM | POA: Diagnosis not present

## 2021-12-18 DIAGNOSIS — N281 Cyst of kidney, acquired: Secondary | ICD-10-CM | POA: Diagnosis not present

## 2021-12-18 DIAGNOSIS — Z944 Liver transplant status: Secondary | ICD-10-CM | POA: Diagnosis not present

## 2021-12-18 DIAGNOSIS — I5032 Chronic diastolic (congestive) heart failure: Secondary | ICD-10-CM | POA: Diagnosis not present

## 2021-12-18 DIAGNOSIS — I130 Hypertensive heart and chronic kidney disease with heart failure and stage 1 through stage 4 chronic kidney disease, or unspecified chronic kidney disease: Secondary | ICD-10-CM | POA: Diagnosis not present

## 2021-12-18 DIAGNOSIS — Z9181 History of falling: Secondary | ICD-10-CM | POA: Diagnosis not present

## 2021-12-18 DIAGNOSIS — D631 Anemia in chronic kidney disease: Secondary | ICD-10-CM | POA: Diagnosis not present

## 2021-12-18 DIAGNOSIS — Z7952 Long term (current) use of systemic steroids: Secondary | ICD-10-CM | POA: Diagnosis not present

## 2021-12-18 DIAGNOSIS — M479 Spondylosis, unspecified: Secondary | ICD-10-CM | POA: Diagnosis not present

## 2021-12-18 DIAGNOSIS — M1990 Unspecified osteoarthritis, unspecified site: Secondary | ICD-10-CM | POA: Diagnosis not present

## 2021-12-18 DIAGNOSIS — Z7982 Long term (current) use of aspirin: Secondary | ICD-10-CM | POA: Diagnosis not present

## 2021-12-18 DIAGNOSIS — I10 Essential (primary) hypertension: Secondary | ICD-10-CM | POA: Diagnosis not present

## 2021-12-18 DIAGNOSIS — M1A9XX1 Chronic gout, unspecified, with tophus (tophi): Secondary | ICD-10-CM | POA: Diagnosis not present

## 2021-12-18 DIAGNOSIS — N1831 Chronic kidney disease, stage 3a: Secondary | ICD-10-CM | POA: Diagnosis not present

## 2021-12-18 DIAGNOSIS — K219 Gastro-esophageal reflux disease without esophagitis: Secondary | ICD-10-CM | POA: Diagnosis not present

## 2021-12-25 ENCOUNTER — Other Ambulatory Visit: Payer: Self-pay

## 2021-12-25 ENCOUNTER — Emergency Department (HOSPITAL_BASED_OUTPATIENT_CLINIC_OR_DEPARTMENT_OTHER): Payer: Medicare Other

## 2021-12-25 ENCOUNTER — Encounter (HOSPITAL_BASED_OUTPATIENT_CLINIC_OR_DEPARTMENT_OTHER): Payer: Self-pay | Admitting: Obstetrics and Gynecology

## 2021-12-25 ENCOUNTER — Inpatient Hospital Stay (HOSPITAL_BASED_OUTPATIENT_CLINIC_OR_DEPARTMENT_OTHER)
Admission: EM | Admit: 2021-12-25 | Discharge: 2021-12-31 | DRG: 690 | Disposition: A | Discharge status: Home / Self Care | Payer: Medicare Other | Attending: Internal Medicine | Admitting: Internal Medicine

## 2021-12-25 DIAGNOSIS — G8929 Other chronic pain: Secondary | ICD-10-CM | POA: Diagnosis present

## 2021-12-25 DIAGNOSIS — E872 Acidosis, unspecified: Secondary | ICD-10-CM | POA: Diagnosis present

## 2021-12-25 DIAGNOSIS — M1990 Unspecified osteoarthritis, unspecified site: Secondary | ICD-10-CM | POA: Diagnosis not present

## 2021-12-25 DIAGNOSIS — Z7952 Long term (current) use of systemic steroids: Secondary | ICD-10-CM

## 2021-12-25 DIAGNOSIS — K219 Gastro-esophageal reflux disease without esophagitis: Secondary | ICD-10-CM | POA: Diagnosis present

## 2021-12-25 DIAGNOSIS — A419 Sepsis, unspecified organism: Secondary | ICD-10-CM | POA: Diagnosis present

## 2021-12-25 DIAGNOSIS — Z981 Arthrodesis status: Secondary | ICD-10-CM | POA: Diagnosis not present

## 2021-12-25 DIAGNOSIS — M549 Dorsalgia, unspecified: Secondary | ICD-10-CM | POA: Diagnosis not present

## 2021-12-25 DIAGNOSIS — N1831 Chronic kidney disease, stage 3a: Secondary | ICD-10-CM | POA: Diagnosis not present

## 2021-12-25 DIAGNOSIS — I639 Cerebral infarction, unspecified: Secondary | ICD-10-CM | POA: Diagnosis not present

## 2021-12-25 DIAGNOSIS — Z841 Family history of disorders of kidney and ureter: Secondary | ICD-10-CM

## 2021-12-25 DIAGNOSIS — Z6835 Body mass index (BMI) 35.0-35.9, adult: Secondary | ICD-10-CM

## 2021-12-25 DIAGNOSIS — R197 Diarrhea, unspecified: Secondary | ICD-10-CM | POA: Diagnosis present

## 2021-12-25 DIAGNOSIS — Z7982 Long term (current) use of aspirin: Secondary | ICD-10-CM

## 2021-12-25 DIAGNOSIS — I700 Atherosclerosis of aorta: Secondary | ICD-10-CM | POA: Diagnosis not present

## 2021-12-25 DIAGNOSIS — R944 Abnormal results of kidney function studies: Secondary | ICD-10-CM | POA: Diagnosis not present

## 2021-12-25 DIAGNOSIS — K5730 Diverticulosis of large intestine without perforation or abscess without bleeding: Secondary | ICD-10-CM | POA: Diagnosis present

## 2021-12-25 DIAGNOSIS — N289 Disorder of kidney and ureter, unspecified: Secondary | ICD-10-CM

## 2021-12-25 DIAGNOSIS — I10 Essential (primary) hypertension: Secondary | ICD-10-CM | POA: Diagnosis not present

## 2021-12-25 DIAGNOSIS — K567 Ileus, unspecified: Secondary | ICD-10-CM | POA: Diagnosis not present

## 2021-12-25 DIAGNOSIS — N179 Acute kidney failure, unspecified: Secondary | ICD-10-CM | POA: Diagnosis not present

## 2021-12-25 DIAGNOSIS — R000 Tachycardia, unspecified: Secondary | ICD-10-CM | POA: Diagnosis not present

## 2021-12-25 DIAGNOSIS — D6959 Other secondary thrombocytopenia: Secondary | ICD-10-CM | POA: Diagnosis present

## 2021-12-25 DIAGNOSIS — Z8249 Family history of ischemic heart disease and other diseases of the circulatory system: Secondary | ICD-10-CM

## 2021-12-25 DIAGNOSIS — M109 Gout, unspecified: Secondary | ICD-10-CM | POA: Diagnosis not present

## 2021-12-25 DIAGNOSIS — N390 Urinary tract infection, site not specified: Secondary | ICD-10-CM | POA: Diagnosis not present

## 2021-12-25 DIAGNOSIS — N281 Cyst of kidney, acquired: Secondary | ICD-10-CM | POA: Diagnosis not present

## 2021-12-25 DIAGNOSIS — R6520 Severe sepsis without septic shock: Secondary | ICD-10-CM | POA: Diagnosis not present

## 2021-12-25 DIAGNOSIS — M1A9XX1 Chronic gout, unspecified, with tophus (tophi): Secondary | ICD-10-CM | POA: Diagnosis not present

## 2021-12-25 DIAGNOSIS — I69354 Hemiplegia and hemiparesis following cerebral infarction affecting left non-dominant side: Secondary | ICD-10-CM

## 2021-12-25 DIAGNOSIS — N400 Enlarged prostate without lower urinary tract symptoms: Secondary | ICD-10-CM | POA: Diagnosis present

## 2021-12-25 DIAGNOSIS — E669 Obesity, unspecified: Secondary | ICD-10-CM | POA: Diagnosis present

## 2021-12-25 DIAGNOSIS — Z87440 Personal history of urinary (tract) infections: Secondary | ICD-10-CM

## 2021-12-25 DIAGNOSIS — Z944 Liver transplant status: Secondary | ICD-10-CM

## 2021-12-25 DIAGNOSIS — E785 Hyperlipidemia, unspecified: Secondary | ICD-10-CM | POA: Diagnosis not present

## 2021-12-25 DIAGNOSIS — I130 Hypertensive heart and chronic kidney disease with heart failure and stage 1 through stage 4 chronic kidney disease, or unspecified chronic kidney disease: Secondary | ICD-10-CM | POA: Diagnosis not present

## 2021-12-25 DIAGNOSIS — I5032 Chronic diastolic (congestive) heart failure: Secondary | ICD-10-CM | POA: Diagnosis present

## 2021-12-25 DIAGNOSIS — E876 Hypokalemia: Secondary | ICD-10-CM | POA: Diagnosis not present

## 2021-12-25 DIAGNOSIS — Z800 Family history of malignant neoplasm of digestive organs: Secondary | ICD-10-CM

## 2021-12-25 DIAGNOSIS — G4733 Obstructive sleep apnea (adult) (pediatric): Secondary | ICD-10-CM | POA: Diagnosis present

## 2021-12-25 DIAGNOSIS — Z9181 History of falling: Secondary | ICD-10-CM | POA: Diagnosis not present

## 2021-12-25 DIAGNOSIS — Z833 Family history of diabetes mellitus: Secondary | ICD-10-CM

## 2021-12-25 DIAGNOSIS — Z79899 Other long term (current) drug therapy: Secondary | ICD-10-CM

## 2021-12-25 DIAGNOSIS — R7989 Other specified abnormal findings of blood chemistry: Secondary | ICD-10-CM | POA: Diagnosis not present

## 2021-12-25 DIAGNOSIS — Z8261 Family history of arthritis: Secondary | ICD-10-CM

## 2021-12-25 DIAGNOSIS — Z7960 Long term (current) use of unspecified immunomodulators and immunosuppressants: Secondary | ICD-10-CM

## 2021-12-25 DIAGNOSIS — M479 Spondylosis, unspecified: Secondary | ICD-10-CM | POA: Diagnosis not present

## 2021-12-25 DIAGNOSIS — D631 Anemia in chronic kidney disease: Secondary | ICD-10-CM | POA: Diagnosis not present

## 2021-12-25 DIAGNOSIS — G4730 Sleep apnea, unspecified: Secondary | ICD-10-CM | POA: Diagnosis present

## 2021-12-25 DIAGNOSIS — R109 Unspecified abdominal pain: Secondary | ICD-10-CM | POA: Diagnosis not present

## 2021-12-25 LAB — CBC WITH DIFFERENTIAL/PLATELET
Abs Immature Granulocytes: 0.16 10*3/uL — ABNORMAL HIGH (ref 0.00–0.07)
Basophils Absolute: 0 10*3/uL (ref 0.0–0.1)
Basophils Relative: 0 %
Eosinophils Absolute: 0 10*3/uL (ref 0.0–0.5)
Eosinophils Relative: 0 %
HCT: 43.6 % (ref 39.0–52.0)
Hemoglobin: 14.9 g/dL (ref 13.0–17.0)
Immature Granulocytes: 1 %
Lymphocytes Relative: 9 %
Lymphs Abs: 1.6 10*3/uL (ref 0.7–4.0)
MCH: 32.6 pg (ref 26.0–34.0)
MCHC: 34.2 g/dL (ref 30.0–36.0)
MCV: 95.4 fL (ref 80.0–100.0)
Monocytes Absolute: 2 10*3/uL — ABNORMAL HIGH (ref 0.1–1.0)
Monocytes Relative: 11 %
Neutro Abs: 14.9 10*3/uL — ABNORMAL HIGH (ref 1.7–7.7)
Neutrophils Relative %: 79 %
Platelets: 168 10*3/uL (ref 150–400)
RBC: 4.57 MIL/uL (ref 4.22–5.81)
RDW: 14.7 % (ref 11.5–15.5)
WBC: 18.8 10*3/uL — ABNORMAL HIGH (ref 4.0–10.5)
nRBC: 0 % (ref 0.0–0.2)

## 2021-12-25 LAB — COMPREHENSIVE METABOLIC PANEL
ALT: 43 U/L (ref 0–44)
AST: 17 U/L (ref 15–41)
Albumin: 4.3 g/dL (ref 3.5–5.0)
Alkaline Phosphatase: 43 U/L (ref 38–126)
Anion gap: 15 (ref 5–15)
BUN: 14 mg/dL (ref 8–23)
CO2: 20 mmol/L — ABNORMAL LOW (ref 22–32)
Calcium: 9.8 mg/dL (ref 8.9–10.3)
Chloride: 104 mmol/L (ref 98–111)
Creatinine, Ser: 1.68 mg/dL — ABNORMAL HIGH (ref 0.61–1.24)
GFR, Estimated: 45 mL/min — ABNORMAL LOW (ref >60)
Glucose, Bld: 145 mg/dL — ABNORMAL HIGH (ref 70–99)
Potassium: 3.7 mmol/L (ref 3.5–5.1)
Sodium: 139 mmol/L (ref 135–145)
Total Bilirubin: 1.7 mg/dL — ABNORMAL HIGH (ref 0.3–1.2)
Total Protein: 6.5 g/dL (ref 6.5–8.1)

## 2021-12-25 LAB — PROTIME-INR
INR: 1 (ref 0.8–1.2)
Prothrombin Time: 12.7 seconds (ref 11.4–15.2)

## 2021-12-25 LAB — LACTIC ACID, PLASMA: Lactic Acid, Venous: 3.6 mmol/L (ref 0.5–1.9)

## 2021-12-25 MED ORDER — LACTATED RINGERS IV BOLUS
1000.0000 mL | Freq: Once | INTRAVENOUS | Status: AC
  Administered 2021-12-26: 1000 mL via INTRAVENOUS

## 2021-12-25 MED ORDER — ONDANSETRON HCL 4 MG/2ML IJ SOLN: 4.0000 mg | Freq: Once | INTRAMUSCULAR | Status: DC

## 2021-12-25 MED ORDER — LACTATED RINGERS IV SOLN: INTRAVENOUS | Status: AC

## 2021-12-25 MED ORDER — VANCOMYCIN HCL IN DEXTROSE 1-5 GM/200ML-% IV SOLN: 1000.0000 mg | Freq: Once | INTRAVENOUS | Status: DC

## 2021-12-25 MED ORDER — VANCOMYCIN HCL IN DEXTROSE 1-5 GM/200ML-% IV SOLN
1000.0000 mg | INTRAVENOUS | Status: AC
  Administered 2021-12-26: 1000 mg via INTRAVENOUS
  Filled 2021-12-25: qty 200

## 2021-12-25 MED ORDER — SODIUM CHLORIDE 0.9 % IV SOLN
2.0000 g | Freq: Once | INTRAVENOUS | Status: AC
  Administered 2021-12-26: 2 g via INTRAVENOUS
  Filled 2021-12-25: qty 12.5

## 2021-12-25 MED ORDER — ONDANSETRON HCL 4 MG/2ML IJ SOLN
4.0000 mg | Freq: Once | INTRAMUSCULAR | Status: AC
  Administered 2021-12-25: 4 mg via INTRAVENOUS
  Filled 2021-12-25: qty 2

## 2021-12-25 MED ORDER — LACTATED RINGERS IV BOLUS: 500.0000 mL | Freq: Once | INTRAVENOUS | Status: DC

## 2021-12-25 MED ORDER — METRONIDAZOLE 500 MG/100ML IV SOLN
500.0000 mg | Freq: Once | INTRAVENOUS | Status: AC
Start: 2021-12-26 — End: 2021-12-26
  Administered 2021-12-26: 500 mg via INTRAVENOUS
  Filled 2021-12-25: qty 100

## 2021-12-25 NOTE — ED Triage Notes (Signed)
Patient reports he was recently at [facility] for C-diff and UTI. Patient endorses dirrhea, nausea, chills, and poor kidney function. Patient states he has been urinating frequently.

## 2021-12-25 NOTE — ED Provider Notes (Signed)
[facility]ERGENC[facility] DEPT Provider Note   CSN: [id number] Arrival date & time: 12/25/21  2200     History {Add pertinent medical, surgical, social history, OB history to HPI:1} Chief Complaint  Patient presents with   Diarrhea    Devin White is a 66 y.o. male.  The history is provided by the patient.  Diarrhea He has history of hypertension, chronic kidney disease, liver transplant comes in because of diarrhea and dysuria.  Diarrhea started yesterday with about 5 loose bowel movements, he estimates 15 or more bowel movements today.  There has been no blood or mucus.  There has been associated nausea and he has had chills but no fever or sweats.  There has been some abdominal cramping.  Symptoms are similar to what he had with C. difficile infection 2 months ago.  He also describes dysuria with associated urinary urgency, frequency, tenesmus.  Prior infection with custody and aphasia was related to antibiotics given for urinary tract infection, he has not been on any antibiotics since completing his course of vancomycin.   Home Medications Prior to Admission medications   Medication Sig Start Date End Date Taking? Authorizing Provider  acetaminophen (TYLENOL) 325 MG tablet Take 650 mg by mouth every 6 (six) hours as needed for mild pain.    [provider]  amLODipine (NORVASC) 5 MG tablet Take 5 mg by mouth. 10/06/21   [provider]  ascorbic acid (VITAMIN C) 500 MG tablet Take 1 tablet (500 mg total) by mouth daily. 11/07/21   Hosie Poisson, MD  aspirin 325 MG tablet Take 1 tablet (325 mg total) by mouth daily. 11/07/21   Hosie Poisson, MD  calcium carbonate (TUMS) 500 MG chewable tablet Chew 1 tablet (200 mg of elemental calcium total) by mouth 2 (two) times daily as needed for indigestion or heartburn. 06/26/16   Tonia Ghent, MD  colchicine 0.6 MG tablet Take 1 tablet (0.6 mg total) by mouth daily as needed. 11/06/21   Hosie Poisson, MD   diclofenac Sodium (VOLTAREN) 1 % GEL Apply 4 g topically 4 (four) times daily. Patient taking differently: Apply 4 g topically 2 (two) times daily as needed (pain). 05/25/21 05/25/22  Venia Carbon, MD  feeding supplement, GLUCERNA SHAKE, (GLUCERNA SHAKE) LIQD Take 237 mLs by mouth 2 (two) times daily between meals. 11/07/21 01/06/22  Hosie Poisson, MD  furosemide (LASIX) 40 MG tablet Take 1 tablet (40 mg total) by mouth daily. 10/12/21   Venia Carbon, MD  lisinopril (ZESTRIL) 5 MG tablet Take 5 mg by mouth daily.    [provider]  Multiple Vitamin (MULTIVITAMIN WITH MINERALS) TABS tablet Take 1 tablet by mouth daily. 11/07/21   Hosie Poisson, MD  mycophenolate (CELLCEPT) 250 MG capsule Take 250-500 mg by mouth 2 (two) times daily. 2 capsules (500 mg) in the morning and one capsule (250 mg) in the evening 02/04/19   [provider]  omeprazole (PRILOSEC) 20 MG capsule TAKE 1 CAPSULE BY MOUTH TWICE DAILY Patient taking differently: daily. 02/26/21   Venia Carbon, MD  predniSONE (DELTASONE) 10 MG tablet Take 10 mg by mouth daily with breakfast.    [provider]  rosuvastatin (CRESTOR) 5 MG tablet Take 1 tablet (5 mg total) by mouth every evening. 11/06/21   Hosie Poisson, MD  SUMAtriptan (IMITREX) 100 MG tablet Take 1 tablet (100 mg total) by mouth once as needed for migraine (may repeat x 1 in 24 hrs not more than 2  days per week. total 9 tablets). May repeat in 2 hours if headache persists or recurs. 11/06/21 11/07/22  Hosie Poisson, MD  tacrolimus (PROGRAF) 0.5 MG capsule Take 0.5-1 mg by mouth See admin instructions. Taking 0.5 mg in the morning and 1 mg at bedtime    [provider]  tamsulosin (FLOMAX) 0.4 MG CAPS capsule TAKE 1 CAPSULE(0.4 MG) BY MOUTH DAILY 12/17/21   Venia Carbon, MD  topiramate (TOPAMAX) 50 MG tablet Take 1 tablet (50 mg total) by mouth 2 (two) times daily. 11/06/21   Hosie Poisson, MD  vitamin A 3 MG (10000 UNITS) capsule Take 1  capsule (10,000 Units total) by mouth daily. 11/07/21   Hosie Poisson, MD  zinc sulfate 220 (50 Zn) MG capsule Take 1 capsule (220 mg total) by mouth daily. 11/07/21   Hosie Poisson, MD      Allergies    Patient has no known allergies.    Review of Systems   Review of Systems  Gastrointestinal:  Positive for diarrhea.  All other systems reviewed and are negative.   Physical Exam Updated Vital Signs BP 108/82 (BP Location: Left Arm)   Pulse (!) 125   Temp 98.5 F (36.9 C)   Resp (!) 26   Ht '5\' 11"'$  (1.803 m)   Wt 114 kg   SpO2 99%   BMI 35.05 kg/m  Physical Exam Vitals and nursing note reviewed.   66 year old male, resting comfortably and in no acute distress. Vital signs are significant for rapid heart rate and respiratory rate. Oxygen saturation is 99%, which is normal. Head is normocephalic and atraumatic. PERRLA, EOMI. Oropharynx is clear. Neck is nontender and supple without adenopathy or JVD. Back is nontender and there is no CVA tenderness. Lungs are clear without rales, wheezes, or rhonchi. Chest is nontender. Heart has regular rate and rhythm without murmur. Abdomen is soft, flat, with mild tenderness across the lower abdomen.  There is no rebound or guarding.  There are no masses or hepatosplenomegaly and peristalsis is normoactive. Extremities have no cyanosis or edema, full range of motion is present. Skin is warm and dry without rash. Neurologic: Mental status is normal, cranial nerves are intact, moves all extremities equally.  ED Results / Procedures / Treatments   Labs (all labs ordered are listed, but only abnormal results are displayed) Labs Reviewed  CBC WITH DIFFERENTIAL/PLATELET - Abnormal; Notable for the following components:      Result Value   WBC 18.8 (*)    Neutro Abs 14.9 (*)    Monocytes Absolute 2.0 (*)    Abs Immature Granulocytes 0.16 (*)    All other components within normal limits  CULTURE, BLOOD (ROUTINE X 2)  CULTURE, BLOOD (ROUTINE  X 2)  PROTIME-INR  COMPREHENSIVE METABOLIC PANEL  LACTIC ACID, PLASMA  LACTIC ACID, PLASMA  URINALYSIS, ROUTINE W REFLEX MICROSCOPIC    EKG EKG Interpretation  Date/Time:  Tuesday December 25 2021 23:14:29 EDT Ventricular Rate:  109 PR Interval:  147 QRS Duration: 80 QT Interval:  318 QTC Calculation: 429 R Axis:   0 Text Interpretation: Sinus tachycardia Probable left atrial enlargement LVH with secondary repolarization abnormality When compared with ECG of 11/02/2021, REPOLARIZATION ABNORMALITY is slightly more pronounced Confirmed by Delora Fuel ([id number]) on 12/25/2021 11:19:24 PM  Radiology No results found.  Procedures Procedures  Cardiac monitor shows sinus tachycardia, per my interpretation.  Medications Ordered in ED Medications  ondansetron (ZOFRAN) injection 4 mg (has no administration in time range)  ED Course/ Medical Decision Making/ A&P                           Medical Decision Making Amount and/or Complexity of Data Reviewed Labs: ordered.  Risk Prescription drug management.   Dysuria suspicious for urinary tract infection, diarrhea which may be recurrence of C. difficile colitis.  Although he is not febrile, he has significant tachycardia.  This may be dehydration, may be sepsis.  Old records are reviewed, And he had been admitted on 10/26/2021 for question of epiphyseal colitis.  Sepsis work-up has been initiated.  I have reviewed and interpreted all of his laboratory tests, my interpretation is marked leukocytosis concerning for infection, renal insufficiency not significantly changed from discharge from hospital on 11/06/2021, elevation of total bilirubin of uncertain cause, possible Gilbert's disease.  Minimal elevation of ALT is not felt to be clinically significant.  Urinalysis is still pending.  Lactic acid is significantly elevated at 3.6 which could be dehydration from diarrhea, but also concerning for sepsis.  Early, goal-directed fluids are ordered.  I  am ordering CT of abdomen and pelvis because of abdominal tenderness and leukocytosis, also he needs to be started empirically on antibiotics in spite of recent C. difficile infection.  Cause for sepsis is could be either C. difficile, urinary tract infection, or both.  {Document critical care time when appropriate:1} {Document review of labs and clinical decision tools ie heart score, Chads2Vasc2 etc:1}  {Document your independent review of radiology images, and any outside records:1} {Document your discussion with family members, caretakers, and with consultants:1} {Document social determinants of health affecting pt's care:1} {Document your decision making why or why not admission, treatments were needed:1} Final Clinical Impression(s) / ED Diagnoses Final diagnoses:  None    Rx / DC Orders ED Discharge Orders     None

## 2021-12-26 ENCOUNTER — Emergency Department (HOSPITAL_BASED_OUTPATIENT_CLINIC_OR_DEPARTMENT_OTHER): Payer: Medicare Other

## 2021-12-26 DIAGNOSIS — A419 Sepsis, unspecified organism: Secondary | ICD-10-CM | POA: Diagnosis present

## 2021-12-26 DIAGNOSIS — M549 Dorsalgia, unspecified: Secondary | ICD-10-CM | POA: Diagnosis present

## 2021-12-26 DIAGNOSIS — M109 Gout, unspecified: Secondary | ICD-10-CM | POA: Diagnosis present

## 2021-12-26 DIAGNOSIS — I130 Hypertensive heart and chronic kidney disease with heart failure and stage 1 through stage 4 chronic kidney disease, or unspecified chronic kidney disease: Secondary | ICD-10-CM | POA: Diagnosis present

## 2021-12-26 DIAGNOSIS — R6520 Severe sepsis without septic shock: Secondary | ICD-10-CM

## 2021-12-26 DIAGNOSIS — Z944 Liver transplant status: Secondary | ICD-10-CM | POA: Diagnosis not present

## 2021-12-26 DIAGNOSIS — N289 Disorder of kidney and ureter, unspecified: Secondary | ICD-10-CM | POA: Diagnosis present

## 2021-12-26 DIAGNOSIS — Z981 Arthrodesis status: Secondary | ICD-10-CM | POA: Diagnosis not present

## 2021-12-26 DIAGNOSIS — E785 Hyperlipidemia, unspecified: Secondary | ICD-10-CM | POA: Diagnosis present

## 2021-12-26 DIAGNOSIS — I700 Atherosclerosis of aorta: Secondary | ICD-10-CM | POA: Diagnosis present

## 2021-12-26 DIAGNOSIS — N400 Enlarged prostate without lower urinary tract symptoms: Secondary | ICD-10-CM | POA: Diagnosis present

## 2021-12-26 DIAGNOSIS — E872 Acidosis, unspecified: Secondary | ICD-10-CM | POA: Diagnosis present

## 2021-12-26 DIAGNOSIS — D6959 Other secondary thrombocytopenia: Secondary | ICD-10-CM | POA: Diagnosis present

## 2021-12-26 DIAGNOSIS — N390 Urinary tract infection, site not specified: Secondary | ICD-10-CM | POA: Diagnosis present

## 2021-12-26 DIAGNOSIS — G4733 Obstructive sleep apnea (adult) (pediatric): Secondary | ICD-10-CM | POA: Diagnosis present

## 2021-12-26 DIAGNOSIS — N1831 Chronic kidney disease, stage 3a: Secondary | ICD-10-CM | POA: Diagnosis present

## 2021-12-26 DIAGNOSIS — Z6835 Body mass index (BMI) 35.0-35.9, adult: Secondary | ICD-10-CM | POA: Diagnosis not present

## 2021-12-26 DIAGNOSIS — G8929 Other chronic pain: Secondary | ICD-10-CM | POA: Diagnosis present

## 2021-12-26 DIAGNOSIS — E669 Obesity, unspecified: Secondary | ICD-10-CM | POA: Diagnosis present

## 2021-12-26 DIAGNOSIS — N179 Acute kidney failure, unspecified: Secondary | ICD-10-CM | POA: Diagnosis not present

## 2021-12-26 DIAGNOSIS — K5730 Diverticulosis of large intestine without perforation or abscess without bleeding: Secondary | ICD-10-CM | POA: Diagnosis present

## 2021-12-26 DIAGNOSIS — E876 Hypokalemia: Secondary | ICD-10-CM | POA: Diagnosis present

## 2021-12-26 DIAGNOSIS — I69354 Hemiplegia and hemiparesis following cerebral infarction affecting left non-dominant side: Secondary | ICD-10-CM | POA: Diagnosis not present

## 2021-12-26 DIAGNOSIS — I5032 Chronic diastolic (congestive) heart failure: Secondary | ICD-10-CM | POA: Diagnosis present

## 2021-12-26 DIAGNOSIS — M1990 Unspecified osteoarthritis, unspecified site: Secondary | ICD-10-CM | POA: Diagnosis present

## 2021-12-26 DIAGNOSIS — R109 Unspecified abdominal pain: Secondary | ICD-10-CM | POA: Diagnosis not present

## 2021-12-26 DIAGNOSIS — K567 Ileus, unspecified: Secondary | ICD-10-CM | POA: Diagnosis not present

## 2021-12-26 DIAGNOSIS — K219 Gastro-esophageal reflux disease without esophagitis: Secondary | ICD-10-CM | POA: Diagnosis present

## 2021-12-26 LAB — COMPREHENSIVE METABOLIC PANEL
ALT: 33 U/L (ref 0–44)
AST: 16 U/L (ref 15–41)
Albumin: 2.8 g/dL — ABNORMAL LOW (ref 3.5–5.0)
Alkaline Phosphatase: 41 U/L (ref 38–126)
Anion gap: 9 (ref 5–15)
BUN: 17 mg/dL (ref 8–23)
CO2: 20 mmol/L — ABNORMAL LOW (ref 22–32)
Calcium: 8.5 mg/dL — ABNORMAL LOW (ref 8.9–10.3)
Chloride: 109 mmol/L (ref 98–111)
Creatinine, Ser: 1.69 mg/dL — ABNORMAL HIGH (ref 0.61–1.24)
GFR, Estimated: 45 mL/min — ABNORMAL LOW (ref >60)
Glucose, Bld: 123 mg/dL — ABNORMAL HIGH (ref 70–99)
Potassium: 3.3 mmol/L — ABNORMAL LOW (ref 3.5–5.1)
Sodium: 138 mmol/L (ref 135–145)
Total Bilirubin: 2.5 mg/dL — ABNORMAL HIGH (ref 0.3–1.2)
Total Protein: 5.2 g/dL — ABNORMAL LOW (ref 6.5–8.1)

## 2021-12-26 LAB — URINALYSIS, ROUTINE W REFLEX MICROSCOPIC
Bilirubin Urine: NEGATIVE
Glucose, UA: NEGATIVE mg/dL
Ketones, ur: NEGATIVE mg/dL
Nitrite: NEGATIVE
Protein, ur: 30 mg/dL — AB
Specific Gravity, Urine: 1.017 (ref 1.005–1.030)
WBC, UA: >50 WBC/hpf — ABNORMAL HIGH (ref 0–5)
pH: 5 (ref 5.0–8.0)

## 2021-12-26 LAB — CBC
HCT: 37.7 % — ABNORMAL LOW (ref 39.0–52.0)
Hemoglobin: 12.3 g/dL — ABNORMAL LOW (ref 13.0–17.0)
MCH: 32.9 pg (ref 26.0–34.0)
MCHC: 32.6 g/dL (ref 30.0–36.0)
MCV: 100.8 fL — ABNORMAL HIGH (ref 80.0–100.0)
Platelets: 103 10*3/uL — ABNORMAL LOW (ref 150–400)
RBC: 3.74 MIL/uL — ABNORMAL LOW (ref 4.22–5.81)
RDW: 14.9 % (ref 11.5–15.5)
WBC: 16.1 10*3/uL — ABNORMAL HIGH (ref 4.0–10.5)
nRBC: 0 % (ref 0.0–0.2)

## 2021-12-26 LAB — LACTIC ACID, PLASMA
Lactic Acid, Venous: 3.2 mmol/L (ref 0.5–1.9)
Lactic Acid, Venous: 1.6 mmol/L (ref 0.5–1.9)

## 2021-12-26 MED ORDER — ROSUVASTATIN CALCIUM 5 MG PO TABS
5.0000 mg | ORAL_TABLET | Freq: Every evening | ORAL | Status: DC
  Administered 2021-12-26 – 2021-12-30 (×5): 5 mg via ORAL
  Filled 2021-12-26 (×5): qty 1

## 2021-12-26 MED ORDER — TOPIRAMATE 25 MG PO TABS
50.0000 mg | ORAL_TABLET | Freq: Two times a day (BID) | ORAL | Status: DC
  Administered 2021-12-26 – 2021-12-31 (×10): 50 mg via ORAL
  Filled 2021-12-26 (×10): qty 2

## 2021-12-26 MED ORDER — ACETAMINOPHEN 325 MG PO TABS
650.0000 mg | ORAL_TABLET | Freq: Once | ORAL | Status: AC
  Administered 2021-12-26: 650 mg via ORAL
  Filled 2021-12-26: qty 2

## 2021-12-26 MED ORDER — SODIUM CHLORIDE 0.9 % IV SOLN: INTRAVENOUS | Status: DC | PRN

## 2021-12-26 MED ORDER — LISINOPRIL 5 MG PO TABS
5.0000 mg | ORAL_TABLET | Freq: Every day | ORAL | Status: DC
Start: 2021-12-26 — End: 2021-12-27
  Administered 2021-12-26 – 2021-12-27 (×2): 5 mg via ORAL
  Filled 2021-12-26 (×2): qty 1

## 2021-12-26 MED ORDER — OXYCODONE HCL 5 MG PO TABS
5.0000 mg | ORAL_TABLET | ORAL | Status: DC | PRN
  Administered 2021-12-26 – 2021-12-29 (×7): 5 mg via ORAL
  Filled 2021-12-26 (×8): qty 1

## 2021-12-26 MED ORDER — ONDANSETRON HCL 4 MG/2ML IJ SOLN
4.0000 mg | Freq: Once | INTRAMUSCULAR | Status: AC
Start: 2021-12-26 — End: 2021-12-26
  Administered 2021-12-26: 4 mg via INTRAVENOUS
  Filled 2021-12-26: qty 2

## 2021-12-26 MED ORDER — FUROSEMIDE 40 MG PO TABS
40.0000 mg | ORAL_TABLET | Freq: Every day | ORAL | Status: DC
  Administered 2021-12-27: 40 mg via ORAL
  Filled 2021-12-26: qty 1

## 2021-12-26 MED ORDER — ONDANSETRON HCL 4 MG/2ML IJ SOLN
4.0000 mg | Freq: Four times a day (QID) | INTRAMUSCULAR | Status: DC | PRN
  Administered 2021-12-26 – 2021-12-28 (×4): 4 mg via INTRAVENOUS
  Filled 2021-12-26 (×4): qty 2

## 2021-12-26 MED ORDER — POTASSIUM CHLORIDE CRYS ER 20 MEQ PO TBCR
40.0000 meq | EXTENDED_RELEASE_TABLET | ORAL | Status: AC
  Administered 2021-12-26 (×2): 40 meq via ORAL
  Filled 2021-12-26 (×2): qty 2

## 2021-12-26 MED ORDER — TAMSULOSIN HCL 0.4 MG PO CAPS
0.4000 mg | ORAL_CAPSULE | Freq: Every day | ORAL | Status: DC
  Administered 2021-12-26 – 2021-12-31 (×6): 0.4 mg via ORAL
  Filled 2021-12-26 (×6): qty 1

## 2021-12-26 MED ORDER — FENTANYL CITRATE PF 50 MCG/ML IJ SOSY
50.0000 ug | PREFILLED_SYRINGE | INTRAMUSCULAR | Status: DC | PRN
  Administered 2021-12-26: 50 ug via INTRAVENOUS
  Filled 2021-12-26: qty 1

## 2021-12-26 MED ORDER — COLCHICINE 0.6 MG PO TABS
0.6000 mg | ORAL_TABLET | ORAL | Status: DC
  Administered 2021-12-27 – 2021-12-29 (×2): 0.6 mg via ORAL
  Filled 2021-12-26 (×3): qty 1

## 2021-12-26 MED ORDER — ASPIRIN 325 MG PO TABS
325.0000 mg | ORAL_TABLET | Freq: Every day | ORAL | Status: DC
  Administered 2021-12-26 – 2021-12-31 (×6): 325 mg via ORAL
  Filled 2021-12-26 (×6): qty 1

## 2021-12-26 MED ORDER — SUMATRIPTAN SUCCINATE 50 MG PO TABS
100.0000 mg | ORAL_TABLET | Freq: Once | ORAL | Status: DC | PRN
Start: 2021-12-26 — End: 2021-12-31
  Administered 2021-12-28 – 2021-12-29 (×2): 100 mg via ORAL
  Filled 2021-12-26 (×4): qty 2

## 2021-12-26 MED ORDER — HYDROMORPHONE HCL 1 MG/ML IJ SOLN
1.0000 mg | INTRAMUSCULAR | Status: DC | PRN
  Administered 2021-12-26 – 2021-12-28 (×5): 1 mg via INTRAVENOUS
  Filled 2021-12-26 (×6): qty 1

## 2021-12-26 MED ORDER — ENOXAPARIN SODIUM 40 MG/0.4ML IJ SOSY
40.0000 mg | PREFILLED_SYRINGE | INTRAMUSCULAR | Status: DC
Start: 2021-12-26 — End: 2021-12-31
  Administered 2021-12-26 – 2021-12-30 (×5): 40 mg via SUBCUTANEOUS
  Filled 2021-12-26 (×5): qty 0.4

## 2021-12-26 MED ORDER — ACETAMINOPHEN 650 MG RE SUPP: 650.0000 mg | Freq: Four times a day (QID) | RECTAL | Status: DC | PRN

## 2021-12-26 MED ORDER — PANTOPRAZOLE SODIUM 40 MG PO TBEC
40.0000 mg | DELAYED_RELEASE_TABLET | Freq: Every day | ORAL | Status: DC
  Administered 2021-12-26 – 2021-12-28 (×3): 40 mg via ORAL
  Filled 2021-12-26 (×3): qty 1

## 2021-12-26 MED ORDER — AMLODIPINE BESYLATE 5 MG PO TABS
5.0000 mg | ORAL_TABLET | Freq: Every day | ORAL | Status: DC
  Administered 2021-12-27 – 2021-12-31 (×5): 5 mg via ORAL
  Filled 2021-12-26 (×5): qty 1

## 2021-12-26 MED ORDER — ACETAMINOPHEN 325 MG PO TABS
650.0000 mg | ORAL_TABLET | Freq: Four times a day (QID) | ORAL | Status: DC | PRN
  Administered 2021-12-26 – 2021-12-31 (×4): 650 mg via ORAL
  Filled 2021-12-26 (×4): qty 2

## 2021-12-26 MED ORDER — PREDNISONE 10 MG PO TABS
10.0000 mg | ORAL_TABLET | Freq: Every day | ORAL | Status: DC
  Administered 2021-12-27 – 2021-12-31 (×5): 10 mg via ORAL
  Filled 2021-12-26 (×5): qty 1

## 2021-12-26 MED ORDER — METRONIDAZOLE 500 MG/100ML IV SOLN
500.0000 mg | Freq: Two times a day (BID) | INTRAVENOUS | Status: DC
  Administered 2021-12-26 – 2021-12-28 (×4): 500 mg via INTRAVENOUS
  Filled 2021-12-26 (×4): qty 100

## 2021-12-26 MED ORDER — SODIUM CHLORIDE 0.9 % IV SOLN
2.0000 g | Freq: Two times a day (BID) | INTRAVENOUS | Status: DC
  Administered 2021-12-26 – 2021-12-27 (×3): 2 g via INTRAVENOUS
  Filled 2021-12-26 (×3): qty 12.5

## 2021-12-26 MED ORDER — ONDANSETRON HCL 4 MG PO TABS: 4.0000 mg | ORAL_TABLET | Freq: Four times a day (QID) | ORAL | Status: DC | PRN

## 2021-12-26 MED ORDER — IOHEXOL 9 MG/ML PO SOLN: 500.0000 mL | ORAL | Status: AC

## 2021-12-26 NOTE — ED Notes (Signed)
Pt rang out for water, temp rechecked due to pt feeling hot and cold. ice water given to pt and he denies other needs at this time.

## 2021-12-26 NOTE — Progress Notes (Signed)
Pharmacy Antibiotic Note  Devin White is a 66 y.o. male admitted on 12/25/2021 with UTI.  Pharmacy has been consulted for cefepime dosing for 7 days.  Plan: Cefepime 2 g IV every 12 hours Flagyl 500 mg IV every 12 hours per provider Monitor clinical progress & renal function F/U C&S, abx deescalation / LOT   Height: 6' (182.9 cm) Weight: 118.9 kg (262 lb 2 oz) IBW/kg (Calculated) : 77.6  Temp (24hrs), Avg:98.6 F (37 C), Min:98.1 F (36.7 C), Max:99 F (37.2 C)  Recent Labs  Lab 12/25/21 2257 12/25/21 2258 12/26/21 0105 12/26/21 1415  WBC  --  18.8*  --  16.1*  CREATININE  --  1.68*  --  1.69*  LATICACIDVEN 3.6*  --  3.2* 1.6    Estimated Creatinine Clearance: 59.5 mL/min (A) (by C-G formula based on SCr of 1.69 mg/dL (H)).    No Known Allergies  Antimicrobials this admission: 7/12 vancomycin x 1 7/12 cefepime >> 7/12 Flagyl >>   Dose adjustments this admission:   Microbiology results: 7/11 BCx: pending 7/12 UCx: sent    Thank you for allowing pharmacy to be a part of this patient's care.  Suzzanne Cloud, PharmD, BCPS 12/26/2021 4:39 PM

## 2021-12-26 NOTE — H&P (Signed)
History and Physical    Patient: Devin White JOA:[id number] DOB: 1956-02-18 DOA: 12/25/2021 DOS: the patient was seen and examined on 12/26/2021 PCP: Venia Carbon, MD  Patient coming from: Home  Chief Complaint:  Chief Complaint  Patient presents with   Diarrhea   HPI: Devin White is a 66 y.o. male with medical history significant of osteoarthritis, chronic back pain, BPH, GERD, gout, urolithiasis, recurrent UTIs, liver cirrhosis, liver transplant, Mallory-Weiss tears, mesenteric thrombosis, class II obesity, stage 3a CKD, sleep apnea, discharged in May for C. difficile colitis who is presenting to the emergency department with frequency, dysuria, but no hematuria, flank and abdominal pain and 15 episodes of loose stool since yesterday.  His appetite was decreased.  He has been nauseous.  He has had 2 episodes of emesis.He denied fever, chills, rhinorrhea, sore throat, wheezing or hemoptysis.  No chest pain, palpitations, diaphoresis, PND, orthopnea or pitting edema of the lower extremities.  No polyuria, polydipsia, polyphagia or blurred vision.   ED course: Initial vital signs were temperature 98.5 F, pulse 125, respirations 26, BP 108/82 mmHg O2 sat 99% on room air.  The patient received acetaminophen 650 mg p.o. x1 3000 mL of LR bolus, vancomycin, metronidazole and cefepime.  Lab work: Urinalysis with small hemoglobinuria, proteinuria 30 mg deciliter, large leukocyte esterase, more than 50 WBC and rare bacteria with WBC clumps on microscopic examination.  CBC showed a white count 18.8, hemoglobin 14.9 g/dL platelets 168.  PT 12.7 and INR 1.0.  Lactic acid is 3.6 and 3.2 and 1.6 mmol/L.  CMP showed a CO2 of 20 mmol/L, glucose 145, creatinine 1.68 and total bilirubin 1.7 mg/dL.  Imaging: Portable 1 view chest radiograph with no active disease.  CT abdomen/pelvis without contrast with no acute findings.  There is punctate bilateral nephrolithiasis with no hydronephrosis.  Scattered  sigmoid diverticulosis.  Aortic atherosclerosis.                   ,   Review of Systems: As mentioned in the history of present illness. All other systems reviewed and are negative.  Past Medical History:  Diagnosis Date   Arthritis    back and legs   Benign prostatic hypertrophy    GERD (gastroesophageal reflux disease)    Gout    History of blood transfusion    pt has antibodies in his blood since previous transfusions   History of cirrhosis of liver S/P TRANSPLANT 2009   History of liver failure S/P TRANSPLANT   Hypertension    Left ureteral calculus    Past Surgical History:  Procedure Laterality Date   APPENDECTOMY     bone morrow biopsy     CHOLECYSTECTOMY  2007   CYSTO/ LEFT RETROGRADE PYELOGRAM/ LEFT URETERAL STENT PLACEMENT  03-05-2012  DR Tresa [facility])   LEFT URETERAL CALCULI   CYSTOSCOPY W/ URETERAL STENT PLACEMENT  03/11/2012   Procedure: CYSTOSCOPY WITH STENT REPLACEMENT;  Surgeon: Alexis Frock, MD;  Location: [facility];  Service: Urology;  Laterality: Left;   HERNIA REPAIR  2008   LIVER BIOPSY     LIVER TRANSPLANT  11/24/2007   pt states doing well since liver transplant   LUMBAR DISC SURGERY     LUMBAR FUSION     removal of fistula     URETEROSCOPY  03/11/2012   Procedure: URETEROSCOPY;  Surgeon: Alexis Frock, MD;  Location: [facility];  Service: Urology;  Laterality: Left;   STONE MANIPULATION, stone obtained  [phone] [employer] MCR   Social History:  reports that he has never smoked. He has been exposed to tobacco smoke. He has never used smokeless tobacco. He reports that he does not drink alcohol and does not use drugs.  No Known Allergies  Family History  Problem Relation Age of Onset   Cancer Mother        colon   Arthritis Mother    Vasculitis Mother    Hypertension Mother    Cancer Father        colon   Kidney disease Father    Arthritis Father    Arthritis Brother    Alcohol abuse Maternal Aunt    Diabetes  Maternal Aunt    Alcohol abuse Maternal Uncle    Diabetes Paternal Aunt    Alcohol abuse Maternal Uncle    Alcohol abuse Maternal Uncle     Prior to Admission medications   Medication Sig Start Date End Date Taking? Authorizing Provider  acetaminophen (TYLENOL) 325 MG tablet Take 650 mg by mouth every 6 (six) hours as needed for mild pain.    [provider]  allopurinol (ZYLOPRIM) 300 MG tablet Take 300 mg by mouth daily. 11/20/21   [provider]  amLODipine (NORVASC) 5 MG tablet Take 5 mg by mouth. 10/06/21   [provider]  ascorbic acid (VITAMIN C) 500 MG tablet Take 1 tablet (500 mg total) by mouth daily. 11/07/21   Hosie Poisson, MD  aspirin 325 MG tablet Take 1 tablet (325 mg total) by mouth daily. 11/07/21   Hosie Poisson, MD  calcium carbonate (TUMS) 500 MG chewable tablet Chew 1 tablet (200 mg of elemental calcium total) by mouth 2 (two) times daily as needed for indigestion or heartburn. 06/26/16   Tonia Ghent, MD  colchicine 0.6 MG tablet Take 1 tablet (0.6 mg total) by mouth daily as needed. 11/06/21   Hosie Poisson, MD  diclofenac Sodium (VOLTAREN) 1 % GEL Apply 4 g topically 4 (four) times daily. Patient taking differently: Apply 4 g topically 2 (two) times daily as needed (pain). 05/25/21 05/25/22  Venia Carbon, MD  feeding supplement, GLUCERNA SHAKE, (GLUCERNA SHAKE) LIQD Take 237 mLs by mouth 2 (two) times daily between meals. 11/07/21 01/06/22  Hosie Poisson, MD  furosemide (LASIX) 40 MG tablet Take 1 tablet (40 mg total) by mouth daily. 10/12/21   Venia Carbon, MD  lisinopril (ZESTRIL) 5 MG tablet Take 5 mg by mouth daily.    [provider]  Multiple Vitamin (MULTIVITAMIN WITH MINERALS) TABS tablet Take 1 tablet by mouth daily. 11/07/21   Hosie Poisson, MD  mycophenolate (CELLCEPT) 250 MG capsule Take 250-500 mg by mouth 2 (two) times daily. 2 capsules (500 mg) in the morning and one capsule (250 mg) in the evening 02/04/19    [provider]  omeprazole (PRILOSEC) 20 MG capsule TAKE 1 CAPSULE BY MOUTH TWICE DAILY Patient taking differently: daily. 02/26/21   Venia Carbon, MD  predniSONE (DELTASONE) 10 MG tablet Take 10 mg by mouth daily with breakfast.    [provider]  rosuvastatin (CRESTOR) 5 MG tablet Take 1 tablet (5 mg total) by mouth every evening. 11/06/21   Hosie Poisson, MD  SUMAtriptan (IMITREX) 100 MG tablet Take 1 tablet (100 mg total) by mouth once as needed for migraine (may repeat x 1 in 24 hrs not more than 2 days per week. total 9 tablets). May repeat in 2 hours if headache persists or recurs. 11/06/21  11/07/22  Hosie Poisson, MD  tacrolimus (PROGRAF) 0.5 MG capsule Take 0.5-1 mg by mouth See admin instructions. Taking 0.5 mg in the morning and 1 mg at bedtime    [provider]  tamsulosin (FLOMAX) 0.4 MG CAPS capsule TAKE 1 CAPSULE(0.4 MG) BY MOUTH DAILY 12/17/21   Venia Carbon, MD  topiramate (TOPAMAX) 50 MG tablet Take 1 tablet (50 mg total) by mouth 2 (two) times daily. 11/06/21   Hosie Poisson, MD  vitamin A 3 MG (10000 UNITS) capsule Take 1 capsule (10,000 Units total) by mouth daily. 11/07/21   Hosie Poisson, MD  zinc sulfate 220 (50 Zn) MG capsule Take 1 capsule (220 mg total) by mouth daily. 11/07/21   Hosie Poisson, MD    Physical Exam: Vitals:   12/26/21 1200 12/26/21 1215 12/26/21 1230 12/26/21 1319  BP: (!) 105/59  103/63 118/70  Pulse: 94 96 96 96  Resp:   18 20  Temp:    98.6 F (37 C)  TempSrc:    Oral  SpO2:  94% 94% 95%  Weight:    118.9 kg  Height:    6' (1.829 m)   Physical Exam Vitals and nursing note reviewed.  Constitutional:      Appearance: Normal appearance. He is obese.  HENT:     Head: Normocephalic.     Mouth/Throat:     Mouth: Mucous membranes are moist.  Eyes:     General: No scleral icterus.    Pupils: Pupils are equal, round, and reactive to light.  Neck:     Vascular: No JVD.  Cardiovascular:     Rate and Rhythm:  Normal rate and regular rhythm.  Pulmonary:     Effort: Pulmonary effort is normal.     Breath sounds: Normal breath sounds. No wheezing, rhonchi or rales.  Abdominal:     General: Bowel sounds are normal. There is no distension.     Palpations: Abdomen is soft.     Tenderness: There is abdominal tenderness in the suprapubic area. There is right CVA tenderness. There is no left CVA tenderness, guarding or rebound.  Musculoskeletal:     Cervical back: Neck supple.     Right lower leg: No edema.     Left lower leg: No edema.  Skin:    General: Skin is warm and dry.  Neurological:     General: No focal deficit present.     Mental Status: He is alert and oriented to person, place, and time.  Psychiatric:        Mood and Affect: Mood normal.        Behavior: Behavior normal.   Data Reviewed:  There are no new results to review at this time.  Assessment and Plan: Principal Problem:   Severe sepsis POA ([place]) In the setting of:   Acute UTI (urinary tract infection)    Diarrhea  Admit to PCU/inpatient. Continue IV fluids. Continue cefepime 2 g every 8 hours.   Continue metronidazole 500 mg IVPB q 12 hr. Follow-up blood culture and sensitivity Follow CBC and CMP in a.m. Awaiting stool work-up.  Active Problems:   Hypokalemia Replenished.    BPH without urinary obstruction Continue tamsulosin 0.4 mg p.o. daily.    Hypertension Hold antihypertensives for now. Resume in the morning. Monitor blood pressure and heart rate.    Sleep apnea CPAP nightly.    Liver transplant status ([place])   Hyperbilirubinemia Hold immunosuppressants. Continue prednisone 10 mg p.o. daily. Follow-up LFTs.  Chronic diastolic heart failure (HCC) No signs of decompensation. Resume ACE, diuretic in AM.    Stage 3a chronic kidney disease (CKD) (HCC) Stable. Monitor renal function electrolytes.    GERD Continue PPI.    Advance Care Planning:   Code Status: Full Code   Consults:    Family Communication:   Severity of Illness: The appropriate patient status for this patient is INPATIENT. Inpatient status is judged to be reasonable and necessary in order to provide the required intensity of service to ensure the patient's safety. The patient's presenting symptoms, physical exam findings, and initial radiographic and laboratory data in the context of their chronic comorbidities is felt to place them at high risk for further clinical deterioration. Furthermore, it is not anticipated that the patient will be medically stable for discharge from the hospital within 2 midnights of admission.   * I certify that at the point of admission it is my clinical judgment that the patient will require inpatient hospital care spanning beyond 2 midnights from the point of admission due to high intensity of service, high risk for further deterioration and high frequency of surveillance required.*  Author: Reubin Milan, MD 12/26/2021 2:52 PM  For on call review [email or web address].   This document was prepared using Dragon voice recognition software and may contain some unintended transcription errors.

## 2021-12-26 NOTE — Progress Notes (Signed)
Patient denies CPAP use at home and declines nocturnal use at this time. He states he is not opposed to the use of supplemental oxygen if the need should arise but prefers not having a mask on. Education provided. RT will continue to follow and encourage use.

## 2021-12-26 NOTE — Sepsis Progress Note (Signed)
Elink following Code Sepsis. 

## 2021-12-27 ENCOUNTER — Telehealth: Payer: Self-pay

## 2021-12-27 DIAGNOSIS — R6520 Severe sepsis without septic shock: Secondary | ICD-10-CM | POA: Diagnosis not present

## 2021-12-27 DIAGNOSIS — A419 Sepsis, unspecified organism: Secondary | ICD-10-CM | POA: Diagnosis not present

## 2021-12-27 DIAGNOSIS — E876 Hypokalemia: Secondary | ICD-10-CM

## 2021-12-27 LAB — CBC WITH DIFFERENTIAL/PLATELET
Abs Immature Granulocytes: 0.15 10*3/uL — ABNORMAL HIGH (ref 0.00–0.07)
Basophils Absolute: 0 10*3/uL (ref 0.0–0.1)
Basophils Relative: 0 %
Eosinophils Absolute: 0.1 10*3/uL (ref 0.0–0.5)
Eosinophils Relative: 1 %
HCT: 37.1 % — ABNORMAL LOW (ref 39.0–52.0)
Hemoglobin: 12.2 g/dL — ABNORMAL LOW (ref 13.0–17.0)
Immature Granulocytes: 1 %
Lymphocytes Relative: 10 %
Lymphs Abs: 1.4 10*3/uL (ref 0.7–4.0)
MCH: 32.6 pg (ref 26.0–34.0)
MCHC: 32.9 g/dL (ref 30.0–36.0)
MCV: 99.2 fL (ref 80.0–100.0)
Monocytes Absolute: 1.6 10*3/uL — ABNORMAL HIGH (ref 0.1–1.0)
Monocytes Relative: 11 %
Neutro Abs: 10.8 10*3/uL — ABNORMAL HIGH (ref 1.7–7.7)
Neutrophils Relative %: 77 %
Platelets: 107 10*3/uL — ABNORMAL LOW (ref 150–400)
RBC: 3.74 MIL/uL — ABNORMAL LOW (ref 4.22–5.81)
RDW: 14.8 % (ref 11.5–15.5)
WBC: 14.1 10*3/uL — ABNORMAL HIGH (ref 4.0–10.5)
nRBC: 0 % (ref 0.0–0.2)

## 2021-12-27 LAB — URINE CULTURE: Culture: NO GROWTH

## 2021-12-27 LAB — COMPREHENSIVE METABOLIC PANEL
ALT: 31 U/L (ref 0–44)
AST: 15 U/L (ref 15–41)
Albumin: 2.9 g/dL — ABNORMAL LOW (ref 3.5–5.0)
Alkaline Phosphatase: 50 U/L (ref 38–126)
Anion gap: 9 (ref 5–15)
BUN: 18 mg/dL (ref 8–23)
CO2: 22 mmol/L (ref 22–32)
Calcium: 8.7 mg/dL — ABNORMAL LOW (ref 8.9–10.3)
Chloride: 113 mmol/L — ABNORMAL HIGH (ref 98–111)
Creatinine, Ser: 1.7 mg/dL — ABNORMAL HIGH (ref 0.61–1.24)
GFR, Estimated: 45 mL/min — ABNORMAL LOW (ref >60)
Glucose, Bld: 126 mg/dL — ABNORMAL HIGH (ref 70–99)
Potassium: 4 mmol/L (ref 3.5–5.1)
Sodium: 144 mmol/L (ref 135–145)
Total Bilirubin: 2.1 mg/dL — ABNORMAL HIGH (ref 0.3–1.2)
Total Protein: 5.6 g/dL — ABNORMAL LOW (ref 6.5–8.1)

## 2021-12-27 MED ORDER — SODIUM CHLORIDE 0.9 % IV SOLN
1.0000 g | INTRAVENOUS | Status: DC
  Filled 2021-12-27: qty 10

## 2021-12-27 MED ORDER — SODIUM CHLORIDE 0.9 % IV SOLN: INTRAVENOUS | Status: DC

## 2021-12-27 MED ORDER — SODIUM CHLORIDE 0.9 % IV SOLN
2.0000 g | INTRAVENOUS | Status: DC
  Administered 2021-12-27: 2 g via INTRAVENOUS
  Filled 2021-12-27: qty 20

## 2021-12-27 NOTE — Progress Notes (Signed)
PROGRESS NOTE    Devin White  LFY:[id number] DOB: [birth year] DOA: 12/25/2021 PCP: Venia Carbon, MD   Brief Narrative: 66 year old male with history of recurrent UTI, BPH, C. difficile colitis in May 2023, GERD, gout, kidney stones, liver cirrhosis, liver transplant, osteoarthritis, Mallory-Weiss tear, obesity, mesenteric thrombosis, stage IIIa CKD, sleep apnea admitted with frequency dysuria and flank pain and abdominal pain with multiple episodes of loose stools since last few days prior to admission to the hospital.  He has been complaining of nausea and vomiting and diarrhea. 12/27/2021 discussed with urologist who will see patient as an outpatient for BPH and kidney stones.kidney stones are not obstructive less concern if this would be causing an infection. CT abdomen and pelvis shows punctuate bilateral nephrolithiasis with no hydronephrosis.  Sigmoid diverticulosis.  Patient was tachypneic tachycardic with leukocytosis and lactic acidosis on admission. Assessment & Plan:   Principal Problem:   Severe sepsis ([place]) Active Problems:   GERD   BPH without urinary obstruction   Hypertension   Sleep apnea   Liver transplant status ([place])   Chronic diastolic heart failure (HCC)   Diarrhea   Stage 3a chronic kidney disease (CKD) (HCC)   Hyperbilirubinemia   Acute UTI (urinary tract infection)   Hypokalemia  #1 sepsis likely related to urinary tract infection-patient met criteria for sepsis at the time of admission. He was treated with cefepime will change to Rocephin since he has had no history of Pseudomonas Leukocytosis still persistent but trending down Lactic acidosis resolved  #2 BPH continue Flomax check bladder scan  #3 history of essential hypertension holding Lasix and Zestril continue Norvasc blood pressure soft  #4 obstructive sleep apnea on CPAP  #5 history of liver transplant on immunosuppressants and prednisone at home  #6 history of chronic diastolic heart  failure stable  #7 stage IIIa CKD stable monitor closely holding diuresis and ACE inhibitor with diarrhea nausea and vomiting  #8 hyperlipidemia on Crestor  #9 gout on colchicine  #10 nausea vomiting diarrhea stool C. difficile and GI panel pending  #11 thrombocytopenia likely due to #1 watch platelets on Lovenox  Estimated body mass index is 35.55 kg/m as calculated from the following:   Height as of this encounter: 6' (1.829 m).   Weight as of this encounter: 118.9 kg.  DVT prophylaxis:lovenox Code Status: full Family Communication: none Disposition Plan:  Status is: Inpatient Remains inpatient appropriate because: Sepsis   Consultants:  None  Procedures: None Antimicrobials:  Anti-infectives (From admission, onward)    Start     Dose/Rate Route Frequency Ordered Stop   12/27/21 1545  cefTRIAXone (ROCEPHIN) 1 g in sodium chloride 0.9 % 100 mL IVPB        1 g 200 mL/hr over 30 Minutes Intravenous Every 24 hours 12/27/21 1445     12/26/21 1800  metroNIDAZOLE (FLAGYL) IVPB 500 mg        500 mg 100 mL/hr over 60 Minutes Intravenous Every 12 hours 12/26/21 1344     12/26/21 1430  ceFEPIme (MAXIPIME) 2 g in sodium chloride 0.9 % 100 mL IVPB  Status:  Discontinued        2 g 200 mL/hr over 30 Minutes Intravenous Every 12 hours 12/26/21 1343 12/27/21 1445   12/26/21 0000  ceFEPIme (MAXIPIME) 2 g in sodium chloride 0.9 % 100 mL IVPB        2 g 200 mL/hr over 30 Minutes Intravenous  Once 12/25/21 2346 12/26/21 0041   12/26/21 0000  metroNIDAZOLE (  FLAGYL) IVPB 500 mg        500 mg 100 mL/hr over 60 Minutes Intravenous  Once 12/25/21 2346 12/26/21 0223   12/26/21 0000  vancomycin (VANCOCIN) IVPB 1000 mg/200 mL premix  Status:  Discontinued        1,000 mg 200 mL/hr over 60 Minutes Intravenous  Once 12/25/21 2346 12/25/21 2350   12/26/21 0000  vancomycin (VANCOCIN) IVPB 1000 mg/200 mL premix        1,000 mg 200 mL/hr over 60 Minutes Intravenous Every hour 12/25/21 2350  12/26/21 0216        Subjective: Patient resting in bed complaining of abdominal pain nausea and diarrhea has decreased appetite does not want to eat  Objective: Vitals:   12/26/21 2031 12/27/21 0139 12/27/21 0341 12/27/21 0620  BP: (!) 97/56 108/72  124/69  Pulse: 89 97  99  Resp: $Remo'18 19  18  'DZCsQ$ Temp: 98.8 F (37.1 C) 100 F (37.8 C) 99 F (37.2 C) 98.4 F (36.9 C)  TempSrc: Oral Oral Oral Oral  SpO2: 96% 93%  94%  Weight:      Height:        Intake/Output Summary (Last 24 hours) at 12/27/2021 1430 Last data filed at 12/27/2021 1335 Gross per 24 hour  Intake 838.6 ml  Output 2845 ml  Net -2006.4 ml   Filed Weights   12/25/21 2247 12/26/21 1319  Weight: 114 kg 118.9 kg    Examination:  General exam: Appears chronically ill-appearing and sick respiratory system: Clear to auscultation. Respiratory effort normal. Cardiovascular system: S1 & S2 heard, RRR. No JVD, murmurs, rubs, gallops or clicks. No pedal edema. Gastrointestinal system: Abdomen is distended, soft and right lsided tender. No organomegaly or masses felt. Normal bowel sounds heard. Central nervous system: Alert and oriented. No focal neurological deficits. Extremities:no edema . Skin: No rashes, lesions or ulcers Psychiatry: Judgement and insight appear normal. Mood & affect appropriate.     Data Reviewed: I have personally reviewed following labs and imaging studies  CBC: Recent Labs  Lab 12/25/21 2258 12/26/21 1415 12/27/21 0404  WBC 18.8* 16.1* 14.1*  NEUTROABS 14.9*  --  10.8*  HGB 14.9 12.3* 12.2*  HCT 43.6 37.7* 37.1*  MCV 95.4 100.8* 99.2  PLT 168 103* 154*   Basic Metabolic Panel: Recent Labs  Lab 12/25/21 2258 12/26/21 1415 12/27/21 0404  NA 139 138 144  K 3.7 3.3* 4.0  CL 104 109 113*  CO2 20* 20* 22  GLUCOSE 145* 123* 126*  BUN $Re'14 17 18  'Nbc$ CREATININE 1.68* 1.69* 1.70*  CALCIUM 9.8 8.5* 8.7*   GFR: Estimated Creatinine Clearance: 59.2 mL/min (A) (by C-G formula based on SCr  of 1.7 mg/dL (H)). Liver Function Tests: Recent Labs  Lab 12/25/21 2258 12/26/21 1415 12/27/21 0404  AST $Re'17 16 15  'OZz$ ALT 43 33 31  ALKPHOS 43 41 50  BILITOT 1.7* 2.5* 2.1*  PROT 6.5 5.2* 5.6*  ALBUMIN 4.3 2.8* 2.9*   No results for input(s): "LIPASE", "AMYLASE" in the last 168 hours. No results for input(s): "AMMONIA" in the last 168 hours. Coagulation Profile: Recent Labs  Lab 12/25/21 2258  INR 1.0   Cardiac Enzymes: No results for input(s): "CKTOTAL", "CKMB", "CKMBINDEX", "TROPONINI" in the last 168 hours. BNP (last 3 results) No results for input(s): "PROBNP" in the last 8760 hours. HbA1C: No results for input(s): "HGBA1C" in the last 72 hours. CBG: No results for input(s): "GLUCAP" in the last 168 hours. Lipid Profile: No results for  input(s): "CHOL", "HDL", "LDLCALC", "TRIG", "CHOLHDL", "LDLDIRECT" in the last 72 hours. Thyroid Function Tests: No results for input(s): "TSH", "T4TOTAL", "FREET4", "T3FREE", "THYROIDAB" in the last 72 hours. Anemia Panel: No results for input(s): "VITAMINB12", "FOLATE", "FERRITIN", "TIBC", "IRON", "RETICCTPCT" in the last 72 hours. Sepsis Labs: Recent Labs  Lab 12/25/21 2257 12/26/21 0105 12/26/21 1415  LATICACIDVEN 3.6* 3.2* 1.6    Recent Results (from the past 240 hour(s))  Culture, blood (Routine x 2)     Status: None (Preliminary result)   Collection Time: 12/25/21 10:57 PM   Specimen: Right Antecubital; Blood  Result Value Ref Range Status   Specimen Description   Final    RIGHT ANTECUBITAL Performed at [facility], [place] [address] [place], [place] [address]    Special Requests   Final    NONE Performed at [facility], [address] [place], [place] [address]    Culture   Final    NO GROWTH < 24 HOURS Performed at [facility], [place] [address], [place], [place] [address]    Report Status PENDING  Incomplete  Culture, blood (Routine x 2)     Status: None (Preliminary result)    Collection Time: 12/25/21 10:58 PM   Specimen: Left Antecubital; Blood  Result Value Ref Range Status   Specimen Description   Final    LEFT ANTECUBITAL Performed at [facility], [place]  [address], [place], [place] [address]    Special Requests   Final    NONE Performed at [facility], [address] [place], [place] [address]    Culture   Final    NO GROWTH < 24 HOURS Performed at [facility], [place] [address], [place], [place] [address]    Report Status PENDING  Incomplete  Urine Culture     Status: None   Collection Time: 12/26/21  7:20 AM   Specimen: Urine, Clean Catch  Result Value Ref Range Status   Specimen Description   Final    URINE, CLEAN CATCH Performed at [facility], [address], [place], [place] [address]    Special Requests   Final    NONE Performed at [facility], [address], [place], [place] [address]    Culture   Final    NO GROWTH Performed at [facility], [place] [address] [place], [place] [address]    Report Status 12/27/2021 FINAL  Final         Radiology Studies: CT ABDOMEN PELVIS WO CONTRAST  Result Date: 12/26/2021 CLINICAL DATA:  LLQ abdominal pain EXAM: CT ABDOMEN AND PELVIS WITHOUT CONTRAST TECHNIQUE: Multidetector CT imaging of the abdomen and pelvis was performed following the standard protocol without IV contrast. RADIATION DOSE REDUCTION: This exam was performed according to the departmental dose-optimization program which includes automated exposure control, adjustment of the mA and/or kV according to patient size and/or use of iterative reconstruction technique. COMPARISON:  10/29/2021 FINDINGS: Lower chest: No acute abnormality Hepatobiliary: Prior cholecystectomy.  No focal hepatic abnormality. Pancreas: No focal abnormality or ductal dilatation. Spleen: No focal abnormality.  Normal size. Adrenals/Urinary Tract: Cortical thinning and scarring  bilaterally. Small bilateral low-density lesions in the kidneys, likely cysts, unchanged. No follow-up imaging recommended. Punctate nonobstructing stones in the kidneys bilaterally. No hydronephrosis. Adrenal glands and urinary bladder unremarkable. Stomach/Bowel: Few scattered sigmoid diverticula. No active diverticulitis. Stomach and small bowel decompressed. Prior appendectomy. Vascular/Lymphatic: Aortic atherosclerosis. No evidence of aneurysm or adenopathy. Reproductive: No  visible focal abnormality. Other: No free fluid or free air. Musculoskeletal: No acute bony abnormality. Postoperative changes from posterior fusion in the lower lumbar spine. IMPRESSION: No acute findings in the abdomen or pelvis. Punctate bilateral nephrolithiasis.  No hydronephrosis. Scattered sigmoid diverticulosis. Aortic atherosclerosis. Electronically Signed   By: Rolm Baptise M.D.   On: 12/26/2021 02:45   DG Chest Port 1 View  Result Date: 12/26/2021 CLINICAL DATA:  Sepsis EXAM: PORTABLE CHEST 1 VIEW COMPARISON:  10/27/2011 FINDINGS: Low lung volumes. Borderline cardiomegaly. No edema, pleural effusion or pneumothorax IMPRESSION: No active disease.  Low lung volumes Electronically Signed   By: Donavan Foil M.D.   On: 12/26/2021 00:43        Scheduled Meds:  amLODipine  5 mg Oral Daily   aspirin  325 mg Oral Daily   colchicine  0.6 mg Oral QODAY   enoxaparin (LOVENOX) injection  40 mg Subcutaneous Q24H   furosemide  40 mg Oral Daily   lisinopril  5 mg Oral Daily   pantoprazole  40 mg Oral Daily   predniSONE  10 mg Oral Q breakfast   rosuvastatin  5 mg Oral QPM   tamsulosin  0.4 mg Oral Daily   topiramate  50 mg Oral BID   Continuous Infusions:  sodium chloride 10 mL/hr at 12/26/21 1444   ceFEPime (MAXIPIME) IV 2 g (12/27/21 1016)   metronidazole 500 mg (12/27/21 0616)     LOS: 1 day    Time spent: 12 min  Georgette Shell, MD  12/27/2021, 2:30 PM

## 2021-12-27 NOTE — Plan of Care (Signed)
  Problem: Clinical Measurements: Goal: Diagnostic test results will improve Outcome: Progressing Goal: Respiratory complications will improve Outcome: Progressing   Problem: Coping: Goal: Level of anxiety will decrease Outcome: Progressing   

## 2021-12-27 NOTE — Progress Notes (Signed)
Patient continues to decline CPAP

## 2021-12-27 NOTE — Progress Notes (Addendum)
Due to patient being in the hospital his telephone appointment with Charlene Brooke on 01/01/2022 has been cancelled. I will follow up with patient at a later date to reschedule.  Charlene Brooke, CPP notified  Marijean Niemann, [place] Clinical Pharmacy Assistant [phone]

## 2021-12-27 NOTE — TOC Initial Note (Signed)
Transition of Care [facility]) - Initial/Assessment Note    Patient Details  Name: Devin White MRN: [id number] Date of Birth: February 24, 1956  Transition of Care [facility]) CM/SW Contact:    Leeroy Cha, RN Phone Number: 12/27/2021, 7:27 AM  Clinical Narrative:                  Transition of Care [facility]) Screening Note   Patient Details  Name: Devin White Date of Birth: [birth year]   Transition of Care [facility]) CM/SW Contact:    Leeroy Cha, RN Phone Number: 12/27/2021, 7:27 AM    Transition of Care Department [facility]) has reviewed patient and no TOC needs have been identified at this time. We will continue to monitor patient advancement through interdisciplinary progression rounds. If new patient transition needs arise, please place a TOC consult.    Expected Discharge Plan: Home/Self Care Barriers to Discharge: Continued Medical Work up   Patient Goals and CMS Choice   CMS Medicare.gov Compare Post Acute Care list provided to:: Patient    Expected Discharge Plan and Services Expected Discharge Plan: Home/Self Care   Discharge Planning Services: CM Consult   Living arrangements for the past 2 months: Apartment                                      Prior Living Arrangements/Services Living arrangements for the past 2 months: Apartment Lives with:: Self Patient language and need for interpreter reviewed:: Yes Do you feel safe going back to the place where you live?: Yes            Criminal Activity/Legal Involvement Pertinent to Current Situation/Hospitalization: No - Comment as needed  Activities of Daily Living Home Assistive Devices/Equipment: None ADL Screening (condition at time of admission) Patient's cognitive ability adequate to safely complete daily activities?: Yes Is the patient deaf or have difficulty hearing?: No Does the patient have difficulty seeing, even when wearing glasses/contacts?: No Does the patient have difficulty  concentrating, remembering, or making decisions?: No Patient able to express need for assistance with ADLs?: Yes Does the patient have difficulty dressing or bathing?: No Independently performs ADLs?: Yes (appropriate for developmental age) Does the patient have difficulty walking or climbing stairs?: No Weakness of Legs: None Weakness of Arms/Hands: None  Permission Sought/Granted                  Emotional Assessment Appearance:: Appears stated age     Orientation: : Oriented to Self, Oriented to Place, Oriented to  Time, Oriented to Situation Alcohol / Substance Use: Never Used Psych Involvement: No (comment)  Admission diagnosis:  Renal insufficiency [N28.9] Severe sepsis ([place]) [A41.9, R65.20] Sepsis due to undetermined organism ([place]) [A41.9] Urinary tract infection without hematuria, site unspecified [N39.0] Diarrhea, unspecified type [R19.7] Patient Active Problem List   Diagnosis Date Noted   Severe sepsis ([place]) 12/26/2021   Acute UTI (urinary tract infection) 12/26/2021   Clostridium difficile colitis 11/14/2021   Left sided numbness 11/14/2021   Obesity (BMI 30-39.9) 10/27/2021   Diarrhea 10/26/2021   Stage 3a chronic kidney disease (CKD) ([place]) 10/26/2021   AKI (acute kidney injury) ([place]) 10/26/2021   Hyperbilirubinemia 10/26/2021   Hyperglycemia 10/26/2021   Chronic diastolic heart failure ([place]) 09/05/2021   Mesenteric thrombosis (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   Immunosuppression ([place])  06/01/2012   Hypertension    Routine general medical examination at a health care facility 04/26/2011   Chronic liver failure ([place]) 09/24/2010   Chronic tophaceous gout 12/21/2008   BPH without urinary obstruction 12/21/2008   ALLERGIC RHINITIS 05/22/2007   GERD 05/22/2007   HIATAL HERNIA 05/22/2007   IRRITABLE BOWEL SYNDROME, HX OF 05/22/2007   RENAL CALCULUS, HX OF 05/22/2007   PCP:  Venia Carbon, MD Pharmacy:   [facility], [place], [address], [place] [address] Phone: [phone] Fax: [phone]  [id number] [address], [place] AT [facility] OF [address] [place] [address] Phone: [phone] Fax: [phone]     Social Determinants of Health (SDOH) Interventions    Readmission Risk Interventions    11/06/2021   11:09 AM  Readmission Risk Prevention Plan  Transportation Screening Complete  PCP or Specialist Appt within 5-7 Days Complete  Home Care Screening Complete  Medication Review (RN CM) Referral to Pharmacy

## 2021-12-28 ENCOUNTER — Inpatient Hospital Stay (HOSPITAL_COMMUNITY): Payer: Medicare Other

## 2021-12-28 LAB — COMPREHENSIVE METABOLIC PANEL
ALT: 23 U/L (ref 0–44)
AST: 14 U/L — ABNORMAL LOW (ref 15–41)
Albumin: 2.9 g/dL — ABNORMAL LOW (ref 3.5–5.0)
Alkaline Phosphatase: 49 U/L (ref 38–126)
Anion gap: 9 (ref 5–15)
BUN: 23 mg/dL (ref 8–23)
CO2: 26 mmol/L (ref 22–32)
Calcium: 8.4 mg/dL — ABNORMAL LOW (ref 8.9–10.3)
Chloride: 108 mmol/L (ref 98–111)
Creatinine, Ser: 1.83 mg/dL — ABNORMAL HIGH (ref 0.61–1.24)
GFR, Estimated: 41 mL/min — ABNORMAL LOW (ref >60)
Glucose, Bld: 119 mg/dL — ABNORMAL HIGH (ref 70–99)
Potassium: 3.5 mmol/L (ref 3.5–5.1)
Sodium: 143 mmol/L (ref 135–145)
Total Bilirubin: 0.6 mg/dL (ref 0.3–1.2)
Total Protein: 5.7 g/dL — ABNORMAL LOW (ref 6.5–8.1)

## 2021-12-28 LAB — CBC
HCT: 35.2 % — ABNORMAL LOW (ref 39.0–52.0)
Hemoglobin: 11.4 g/dL — ABNORMAL LOW (ref 13.0–17.0)
MCH: 32.4 pg (ref 26.0–34.0)
MCHC: 32.4 g/dL (ref 30.0–36.0)
MCV: 100 fL (ref 80.0–100.0)
Platelets: 106 10*3/uL — ABNORMAL LOW (ref 150–400)
RBC: 3.52 MIL/uL — ABNORMAL LOW (ref 4.22–5.81)
RDW: 14.5 % (ref 11.5–15.5)
WBC: 8.6 10*3/uL (ref 4.0–10.5)
nRBC: 0 % (ref 0.0–0.2)

## 2021-12-28 MED ORDER — HYDROXYZINE HCL 10 MG PO TABS
10.0000 mg | ORAL_TABLET | Freq: Three times a day (TID) | ORAL | Status: DC | PRN
  Administered 2021-12-28: 10 mg via ORAL
  Filled 2021-12-28 (×2): qty 1

## 2021-12-28 MED ORDER — MYCOPHENOLATE MOFETIL 250 MG PO CAPS
500.0000 mg | ORAL_CAPSULE | Freq: Every day | ORAL | Status: DC
  Administered 2021-12-29 – 2021-12-31 (×3): 500 mg via ORAL
  Filled 2021-12-28 (×3): qty 2

## 2021-12-28 MED ORDER — BISACODYL 10 MG RE SUPP
10.0000 mg | Freq: Once | RECTAL | Status: AC
  Administered 2021-12-28: 10 mg via RECTAL
  Filled 2021-12-28: qty 1

## 2021-12-28 MED ORDER — DICYCLOMINE HCL 10 MG PO CAPS
10.0000 mg | ORAL_CAPSULE | Freq: Three times a day (TID) | ORAL | Status: DC
  Administered 2021-12-28 – 2021-12-31 (×7): 10 mg via ORAL
  Filled 2021-12-28 (×7): qty 1

## 2021-12-28 MED ORDER — MYCOPHENOLATE MOFETIL 250 MG PO CAPS
250.0000 mg | ORAL_CAPSULE | Freq: Every day | ORAL | Status: DC
  Administered 2021-12-28 – 2021-12-30 (×3): 250 mg via ORAL
  Filled 2021-12-28 (×3): qty 1

## 2021-12-28 MED ORDER — DOCUSATE SODIUM 100 MG PO CAPS
200.0000 mg | ORAL_CAPSULE | Freq: Two times a day (BID) | ORAL | Status: DC
  Administered 2021-12-28 – 2021-12-31 (×7): 200 mg via ORAL
  Filled 2021-12-28 (×7): qty 2

## 2021-12-28 MED ORDER — TACROLIMUS 1 MG PO CAPS
1.0000 mg | ORAL_CAPSULE | Freq: Every day | ORAL | Status: DC
  Administered 2021-12-28 – 2021-12-30 (×3): 1 mg via ORAL
  Filled 2021-12-28 (×3): qty 1

## 2021-12-28 MED ORDER — TACROLIMUS 0.5 MG PO CAPS
0.5000 mg | ORAL_CAPSULE | Freq: Every day | ORAL | Status: DC
  Administered 2021-12-29 – 2021-12-31 (×3): 0.5 mg via ORAL
  Filled 2021-12-28 (×3): qty 1

## 2021-12-28 MED ORDER — OMEPRAZOLE 20 MG PO CPDR
20.0000 mg | DELAYED_RELEASE_CAPSULE | Freq: Every day | ORAL | Status: DC
  Administered 2021-12-29 – 2021-12-31 (×3): 20 mg via ORAL
  Filled 2021-12-28 (×3): qty 1

## 2021-12-28 NOTE — Plan of Care (Signed)
  Problem: Education: Goal: Knowledge of General Education information will improve Description: Including pain rating scale, medication(s)/side effects and non-pharmacologic comfort measures Outcome: Progressing   Problem: Coping: Goal: Level of anxiety will decrease Outcome: Progressing   Problem: Pain Managment: Goal: General experience of comfort will improve Outcome: Progressing   Problem: Safety: Goal: Ability to remain free from injury will improve Outcome: Progressing   Problem: Skin Integrity: Goal: Risk for impaired skin integrity will decrease 12/28/2021 0145 by Baker Pierini, RN Outcome: Progressing

## 2021-12-28 NOTE — Progress Notes (Signed)
PROGRESS NOTE    Devin White  GDJ:[id number] DOB: 01/10/1956 DOA: 12/25/2021 PCP: Venia Carbon, MD   Brief Narrative: 66 year old male with history of recurrent UTI, BPH, C. difficile colitis in May 2023, GERD, gout, kidney stones, liver cirrhosis, liver transplant, osteoarthritis, Mallory-Weiss tear, obesity, mesenteric thrombosis, stage IIIa CKD, sleep apnea admitted with frequency dysuria and flank pain and abdominal pain with multiple episodes of loose stools since last few days prior to admission to the hospital.  He has been complaining of nausea and vomiting and diarrhea. 12/27/2021 discussed with urologist who will see patient as an outpatient for BPH and kidney stones.kidney stones are not obstructive less concern if this would be causing an infection. CT abdomen and pelvis shows punctuate bilateral nephrolithiasis with no hydronephrosis.  Sigmoid diverticulosis.  Patient was tachypneic tachycardic with leukocytosis and lactic acidosis on admission. Assessment & Plan:   Principal Problem:   Severe sepsis ([place]) Active Problems:   GERD   BPH without urinary obstruction   Hypertension   Sleep apnea   Liver transplant status ([place])   Chronic diastolic heart failure ([facility])   Diarrhea   Stage 3a chronic kidney disease (CKD) ([facility])   Hyperbilirubinemia   Acute UTI (urinary tract infection)   Hypokalemia  #1 sepsis ruled out -he presented with tachypnea and tachycardia leukocytosis and lactic acidosis.  Blood cultures remain negative.  Urine culture shows no growth.  His diarrhea has improved.  Will DC antibiotics and monitor.  [facility] Pt eval  #2 BPH continue Flomax  check bladder scan 35 cc  #3 history of essential hypertension - holding Lasix and Zestril continue Norvasc blood pressure soft  #4 obstructive sleep apnea on CPAP  #5 history of liver transplant on immunosuppressants and prednisone at home  #6 history of chronic diastolic heart failure stable  #7 stage  IIIa CKD stable monitor closely holding diuresis and ACE inhibitor with diarrhea nausea and vomiting  #8 hyperlipidemia on Crestor  #9 gout on colchicine  #10 nausea vomiting diarrhea-improving  stool C. difficile and GI panel negative  #11 thrombocytopenia- stable on Lovenox  #12 AKI - ? ETIOLOGY probably a combination of dehydration with Lasix ACE inhibitor nausea vomiting and diarrhea.  Renal ultrasound with no hydronephrosis.  We will follow-up in AM.  Estimated body mass index is 35.55 kg/m as calculated from the following:   Height as of this encounter: 6' (1.829 m).   Weight as of this encounter: 118.9 kg.  DVT prophylaxis:lovenox Code Status: full Family Communication: none Disposition Plan:  Status is: Inpatient Remains inpatient appropriate because: Sepsis   Consultants:  None  Procedures: None Antimicrobials:  Anti-infectives (From admission, onward)    Start     Dose/Rate Route Frequency Ordered Stop   12/27/21 1800  cefTRIAXone (ROCEPHIN) 1 g in sodium chloride 0.9 % 100 mL IVPB  Status:  Discontinued        1 g 200 mL/hr over 30 Minutes Intravenous Every 24 hours 12/27/21 1445 12/27/21 1522   12/27/21 1800  cefTRIAXone (ROCEPHIN) 2 g in sodium chloride 0.9 % 100 mL IVPB        2 g 200 mL/hr over 30 Minutes Intravenous Every 24 hours 12/27/21 1522     12/26/21 1800  metroNIDAZOLE (FLAGYL) IVPB 500 mg        500 mg 100 mL/hr over 60 Minutes Intravenous Every 12 hours 12/26/21 1344     12/26/21 1430  ceFEPIme (MAXIPIME) 2 g in sodium chloride 0.9 % 100 mL  IVPB  Status:  Discontinued        2 g 200 mL/hr over 30 Minutes Intravenous Every 12 hours 12/26/21 1343 12/27/21 1445   12/26/21 0000  ceFEPIme (MAXIPIME) 2 g in sodium chloride 0.9 % 100 mL IVPB        2 g 200 mL/hr over 30 Minutes Intravenous  Once 12/25/21 2346 12/26/21 0041   12/26/21 0000  metroNIDAZOLE (FLAGYL) IVPB 500 mg        500 mg 100 mL/hr over 60 Minutes Intravenous  Once 12/25/21 2346  12/26/21 0223   12/26/21 0000  vancomycin (VANCOCIN) IVPB 1000 mg/200 mL premix  Status:  Discontinued        1,000 mg 200 mL/hr over 60 Minutes Intravenous  Once 12/25/21 2346 12/25/21 2350   12/26/21 0000  vancomycin (VANCOCIN) IVPB 1000 mg/200 mL premix        1,000 mg 200 mL/hr over 60 Minutes Intravenous Every hour 12/25/21 2350 12/26/21 0216        Subjective: Continues to c/o abdominal pain Cr up ?  Bladder scan 35 cc  Objective: Vitals:   12/27/21 1340 12/27/21 2259 12/28/21 0545 12/28/21 1214  BP: 98/63 101/70 132/83 (!) 141/89  Pulse: 80 65 77 68  Resp: '15 18 18 18  '$ Temp: 97.7 F (36.5 C) 97.9 F (36.6 C) 98.6 F (37 C) 97.8 F (36.6 C)  TempSrc: Oral Oral Oral Oral  SpO2: 95% 95% 95% 94%  Weight:      Height:        Intake/Output Summary (Last 24 hours) at 12/28/2021 1329 Last data filed at 12/28/2021 1220 Gross per 24 hour  Intake 2477.07 ml  Output 2628 ml  Net -150.93 ml    Filed Weights   12/25/21 2247 12/26/21 1319  Weight: 114 kg 118.9 kg    Examination:  General exam: Appears chronically ill-appearing and sick respiratory system: Clear to auscultation. Respiratory effort normal. Cardiovascular system: S1 & S2 heard, RRR. No JVD, murmurs, rubs, gallops or clicks. No pedal edema. Gastrointestinal system: Abdomen is distended, soft and right lsided tender. No organomegaly or masses felt. Normal bowel sounds heard. Central nervous system: Alert and oriented. No focal neurological deficits. Extremities:no edema . Skin: No rashes, lesions or ulcers Psychiatry: Judgement and insight appear normal. Mood & affect appropriate.     Data Reviewed: I have personally reviewed following labs and imaging studies  CBC: Recent Labs  Lab 12/25/21 2258 12/26/21 1415 12/27/21 0404 12/28/21 0416  WBC 18.8* 16.1* 14.1* 8.6  NEUTROABS 14.9*  --  10.8*  --   HGB 14.9 12.3* 12.2* 11.4*  HCT 43.6 37.7* 37.1* 35.2*  MCV 95.4 100.8* 99.2 100.0  PLT 168 103*  107* 106*    Basic Metabolic Panel: Recent Labs  Lab 12/25/21 2258 12/26/21 1415 12/27/21 0404 12/28/21 0416  NA 139 138 144 143  K 3.7 3.3* 4.0 3.5  CL 104 109 113* 108  CO2 20* 20* 22 26  GLUCOSE 145* 123* 126* 119*  BUN '14 17 18 23  '$ CREATININE 1.68* 1.69* 1.70* 1.83*  CALCIUM 9.8 8.5* 8.7* 8.4*    GFR: Estimated Creatinine Clearance: 55 mL/min (A) (by C-G formula based on SCr of 1.83 mg/dL (H)). Liver Function Tests: Recent Labs  Lab 12/25/21 2258 12/26/21 1415 12/27/21 0404 12/28/21 0416  AST '17 16 15 '$ 14*  ALT 43 33 31 23  ALKPHOS 43 41 50 49  BILITOT 1.7* 2.5* 2.1* 0.6  PROT 6.5 5.2* 5.6* 5.7*  ALBUMIN  4.3 2.8* 2.9* 2.9*    No results for input(s): "LIPASE", "AMYLASE" in the last 168 hours. No results for input(s): "AMMONIA" in the last 168 hours. Coagulation Profile: Recent Labs  Lab 12/25/21 2258  INR 1.0    Cardiac Enzymes: No results for input(s): "CKTOTAL", "CKMB", "CKMBINDEX", "TROPONINI" in the last 168 hours. BNP (last 3 results) No results for input(s): "PROBNP" in the last 8760 hours. HbA1C: No results for input(s): "HGBA1C" in the last 72 hours. CBG: No results for input(s): "GLUCAP" in the last 168 hours. Lipid Profile: No results for input(s): "CHOL", "HDL", "LDLCALC", "TRIG", "CHOLHDL", "LDLDIRECT" in the last 72 hours. Thyroid Function Tests: No results for input(s): "TSH", "T4TOTAL", "FREET4", "T3FREE", "THYROIDAB" in the last 72 hours. Anemia Panel: No results for input(s): "VITAMINB12", "FOLATE", "FERRITIN", "TIBC", "IRON", "RETICCTPCT" in the last 72 hours. Sepsis Labs: Recent Labs  Lab 12/25/21 2257 12/26/21 0105 12/26/21 1415  LATICACIDVEN 3.6* 3.2* 1.6     Recent Results (from the past 240 hour(s))  Culture, blood (Routine x 2)     Status: None (Preliminary result)   Collection Time: 12/25/21 10:57 PM   Specimen: Right Antecubital; Blood  Result Value Ref Range Status   Specimen Description   Final    RIGHT  ANTECUBITAL Performed at [facility], [place] [address], [place], [place] [address]    Special Requests   Final    NONE Performed at [facility], [address] [place], [place] [address]    Culture   Final    NO GROWTH 2 DAYS Performed at [facility], [place] [address], [place], [place] [address]    Report Status PENDING  Incomplete  Culture, blood (Routine x 2)     Status: None (Preliminary result)   Collection Time: 12/25/21 10:58 PM   Specimen: Left Antecubital; Blood  Result Value Ref Range Status   Specimen Description   Final    LEFT ANTECUBITAL Performed at [facility], [place] [address], [place], [place] [address]    Special Requests   Final    NONE Performed at [facility], [address] [place], [place] [address]    Culture   Final    NO GROWTH 2 DAYS Performed at [facility], [place] [address] [place], [place] [address]    Report Status PENDING  Incomplete  Urine Culture     Status: None   Collection Time: 12/26/21  7:20 AM   Specimen: Urine, Clean Catch  Result Value Ref Range Status   Specimen Description   Final    URINE, CLEAN CATCH Performed at [facility], [address] [place], [place] [address]    Special Requests   Final    NONE Performed at [facility], [address] [place], [place] [address]    Culture   Final    NO GROWTH Performed at [facility], [place] [address], [place], [place] [address]    Report Status 12/27/2021 FINAL  Final         Radiology Studies: US RENAL  Result Date: 12/28/2021 CLINICAL DATA:  Elevated serum creatinine EXAM: RENAL / URINARY TRACT ULTRASOUND COMPLETE COMPARISON:  None Available. FINDINGS: Right Kidney: Renal measurements: 11.6 x 5.5 x 4.9 cm = volume: 162.8 mL. Echogenicity within normal limits. No mass or hydronephrosis visualized. Left Kidney: Renal measurements: 11.3 x 5.7 x 4.7 cm = volume:  157.5 mL. Echogenicity within normal limits. No mass or hydronephrosis visualized.  Bladder: Appears normal for degree of bladder distention. Other: None. IMPRESSION: No hydronephrosis. Electronically Signed   By: Macy Mis M.D.   On: 12/28/2021 08:37        Scheduled Meds:  amLODipine  5 mg Oral Daily   aspirin  325 mg Oral Daily   colchicine  0.6 mg Oral QODAY   docusate sodium  200 mg Oral BID   enoxaparin (LOVENOX) injection  40 mg Subcutaneous Q24H   pantoprazole  40 mg Oral Daily   predniSONE  10 mg Oral Q breakfast   rosuvastatin  5 mg Oral QPM   tamsulosin  0.4 mg Oral Daily   topiramate  50 mg Oral BID   Continuous Infusions:  sodium chloride 10 mL/hr at 12/26/21 1444   sodium chloride 75 mL/hr at 12/28/21 0521   cefTRIAXone (ROCEPHIN)  IV 2 g (12/27/21 1746)   metronidazole 500 mg (12/28/21 0526)     LOS: 2 days    Time spent: 52 min  Georgette Shell, MD  12/28/2021, 1:29 PM

## 2021-12-29 ENCOUNTER — Inpatient Hospital Stay (HOSPITAL_COMMUNITY): Payer: Medicare Other

## 2021-12-29 LAB — CBC
HCT: 34.1 % — ABNORMAL LOW (ref 39.0–52.0)
Hemoglobin: 11 g/dL — ABNORMAL LOW (ref 13.0–17.0)
MCH: 32.5 pg (ref 26.0–34.0)
MCHC: 32.3 g/dL (ref 30.0–36.0)
MCV: 100.9 fL — ABNORMAL HIGH (ref 80.0–100.0)
Platelets: 119 10*3/uL — ABNORMAL LOW (ref 150–400)
RBC: 3.38 MIL/uL — ABNORMAL LOW (ref 4.22–5.81)
RDW: 14.1 % (ref 11.5–15.5)
WBC: 6 10*3/uL (ref 4.0–10.5)
nRBC: 0 % (ref 0.0–0.2)

## 2021-12-29 LAB — COMPREHENSIVE METABOLIC PANEL
ALT: 21 U/L (ref 0–44)
AST: 13 U/L — ABNORMAL LOW (ref 15–41)
Albumin: 2.7 g/dL — ABNORMAL LOW (ref 3.5–5.0)
Alkaline Phosphatase: 41 U/L (ref 38–126)
Anion gap: 9 (ref 5–15)
BUN: 21 mg/dL (ref 8–23)
CO2: 24 mmol/L (ref 22–32)
Calcium: 8.3 mg/dL — ABNORMAL LOW (ref 8.9–10.3)
Chloride: 112 mmol/L — ABNORMAL HIGH (ref 98–111)
Creatinine, Ser: 1.61 mg/dL — ABNORMAL HIGH (ref 0.61–1.24)
GFR, Estimated: 48 mL/min — ABNORMAL LOW (ref >60)
Glucose, Bld: 117 mg/dL — ABNORMAL HIGH (ref 70–99)
Potassium: 3.7 mmol/L (ref 3.5–5.1)
Sodium: 145 mmol/L (ref 135–145)
Total Bilirubin: 0.6 mg/dL (ref 0.3–1.2)
Total Protein: 5.2 g/dL — ABNORMAL LOW (ref 6.5–8.1)

## 2021-12-29 LAB — GLUCOSE, CAPILLARY: Glucose-Capillary: 111 mg/dL — ABNORMAL HIGH (ref 70–99)

## 2021-12-29 MED ORDER — BISACODYL 10 MG RE SUPP
10.0000 mg | Freq: Once | RECTAL | Status: DC
  Filled 2021-12-29: qty 1

## 2021-12-29 MED ORDER — SENNA 8.6 MG PO TABS
2.0000 | ORAL_TABLET | Freq: Every day | ORAL | Status: DC
  Administered 2021-12-29 – 2021-12-31 (×3): 17.2 mg via ORAL
  Filled 2021-12-29 (×3): qty 2

## 2021-12-29 MED ORDER — POLYETHYLENE GLYCOL 3350 17 G PO PACK
17.0000 g | PACK | Freq: Every day | ORAL | Status: DC
  Administered 2021-12-29 – 2021-12-30 (×2): 17 g via ORAL
  Filled 2021-12-29 (×3): qty 1

## 2021-12-29 NOTE — Progress Notes (Signed)
Patient ambulated half of [place] and tolerated well.

## 2021-12-29 NOTE — Plan of Care (Signed)
  Problem: Education: Goal: Knowledge of General Education information will improve Description: Including pain rating scale, medication(s)/side effects and non-pharmacologic comfort measures Outcome: Progressing   Problem: Coping: Goal: Level of anxiety will decrease Outcome: Progressing   Problem: Elimination: Goal: Will not experience complications related to urinary retention Outcome: Progressing   Problem: Pain Managment: Goal: General experience of comfort will improve Outcome: Progressing   Problem: Safety: Goal: Ability to remain free from injury will improve Outcome: Progressing   Problem: Skin Integrity: Goal: Risk for impaired skin integrity will decrease Outcome: Progressing   Problem: Respiratory: Goal: Ability to maintain adequate ventilation will improve Outcome: Progressing

## 2021-12-29 NOTE — Significant Event (Signed)
Rapid Response Event Note   Reason for Call : Itching, numbness around mouth, and blurred vision Notified by 4th floor bedside RN, patient just returned from walking and was back to chair. Patient started complaining of generalized itching, numbness around mouth, and blurred vision. Patient does have residual symptoms of weakness on left side due to history of previous stroke.   Initial Focused Assessment:  Neuro: Patient complaining of a mild headache, 6/10 on pain scale, able to follow commands. Weakness apparent on left side of body in comparison to right. Patient complaining of blurred vision but able to read sentences from [place] stroke card.  Cardiac: NSR, HR 70s-80s, borderline hypertension  Pulmonary: RA, 96% O2 Sats, no respiratory distress   During time of Rapid Response call, patient's symptoms improved. Patient did complain of dry mouth. Swab with water applied to mouth. Patient's speech dramatically improved.  Interventions:  Notified MD Zigmund Daniel of patients original symptoms.  Notified bedside RN to give prn Imitrex for headache  Plan of Care:  Patient stable to remain on 4th floor progressive, please call rapid response if patient has any changes in mental or hemodynamic status.  [phone]    Event Summary:   MD Notified: MD Zigmund Daniel paged to beside  Call Time: Rapid call time 0704  Arrival Time: Rapid arrival 0708  End Time: 0800   Debhora Titus C, RN

## 2021-12-29 NOTE — Progress Notes (Signed)
PROGRESS NOTE    Devin White  DTO:[id number] DOB: May 21, 1956 DOA: 12/25/2021 PCP: Venia Carbon, MD   Brief Narrative: 66 year old male with history of recurrent UTI, BPH, C. difficile colitis in May 2023, GERD, gout, kidney stones, liver cirrhosis, liver transplant, osteoarthritis, Mallory-Weiss tear, obesity, mesenteric thrombosis, stage IIIa CKD, sleep apnea admitted with frequency dysuria and flank pain and abdominal pain with multiple episodes of loose stools since last few days prior to admission to the hospital.  He has been complaining of nausea and vomiting and diarrhea. 12/27/2021 discussed with urologist who will see patient as an outpatient for BPH and kidney stones.kidney stones are not obstructive less concern if this would be causing an infection. CT abdomen and pelvis shows punctuate bilateral nephrolithiasis with no hydronephrosis.  Sigmoid diverticulosis.  Patient was tachypneic tachycardic with leukocytosis and lactic acidosis on admission. Assessment & Plan:   Principal Problem:   Severe sepsis ([facility]) Active Problems:   GERD   BPH without urinary obstruction   Hypertension   Sleep apnea   Liver transplant status ([facility])   Chronic diastolic heart failure (HCC)   Diarrhea   Stage 3a chronic kidney disease (CKD) (HCC)   Hyperbilirubinemia   Acute UTI (urinary tract infection)   Hypokalemia  #1 sepsis ruled out -he presented with tachypnea and tachycardia leukocytosis and lactic acidosis.  Blood cultures remain negative.  Urine culture shows no growth.  His diarrhea has improved.  Will DC antibiotics and monitor.  Kub shows ileus Pt eval pending  #2 BPH continue Flomax  He did not have any rest we will in the bladder scan.  #3 history of essential hypertension - holding Lasix and Zestril continue Norvasc blood pressure soft  #4 obstructive sleep apnea on CPAP  #5 history of liver transplant on immunosuppressants and prednisone at home  #6 history of  chronic diastolic heart failure stable  #7  AKI on stage IIIa CKD stable monitor closely holding diuresis and ACE inhibitor with diarrhea nausea and vomiting CT scan and renal ultrasound shows no hydronephrosis Renal functions remained stable and improved  #8 hyperlipidemia on Crestor  #9 gout on colchicine  #10 nausea vomiting diarrhea-improving  stool C. difficile and GI panel negative  #11 thrombocytopenia- stable on Lovenox  Estimated body mass index is 35.55 kg/m as calculated from the following:   Height as of this encounter: 6' (1.829 m).   Weight as of this encounter: 118.9 kg.  DVT prophylaxis:lovenox Code Status: full Family Communication: none Disposition Plan:  Status is: Inpatient Remains inpatient appropriate because: Sepsis   Consultants:  None  Procedures: None Antimicrobials:  Anti-infectives (From admission, onward)    Start     Dose/Rate Route Frequency Ordered Stop   12/27/21 1800  cefTRIAXone (ROCEPHIN) 1 g in sodium chloride 0.9 % 100 mL IVPB  Status:  Discontinued        1 g 200 mL/hr over 30 Minutes Intravenous Every 24 hours 12/27/21 1445 12/27/21 1522   12/27/21 1800  cefTRIAXone (ROCEPHIN) 2 g in sodium chloride 0.9 % 100 mL IVPB  Status:  Discontinued        2 g 200 mL/hr over 30 Minutes Intravenous Every 24 hours 12/27/21 1522 12/28/21 1522   12/26/21 1800  metroNIDAZOLE (FLAGYL) IVPB 500 mg  Status:  Discontinued        500 mg 100 mL/hr over 60 Minutes Intravenous Every 12 hours 12/26/21 1344 12/28/21 1522   12/26/21 1430  ceFEPIme (MAXIPIME) 2 g in sodium chloride  0.9 % 100 mL IVPB  Status:  Discontinued        2 g 200 mL/hr over 30 Minutes Intravenous Every 12 hours 12/26/21 1343 12/27/21 1445   12/26/21 0000  ceFEPIme (MAXIPIME) 2 g in sodium chloride 0.9 % 100 mL IVPB        2 g 200 mL/hr over 30 Minutes Intravenous  Once 12/25/21 2346 12/26/21 0041   12/26/21 0000  metroNIDAZOLE (FLAGYL) IVPB 500 mg        500 mg 100 mL/hr over 60  Minutes Intravenous  Once 12/25/21 2346 12/26/21 0223   12/26/21 0000  vancomycin (VANCOCIN) IVPB 1000 mg/200 mL premix  Status:  Discontinued        1,000 mg 200 mL/hr over 60 Minutes Intravenous  Once 12/25/21 2346 12/25/21 2350   12/26/21 0000  vancomycin (VANCOCIN) IVPB 1000 mg/200 mL premix        1,000 mg 200 mL/hr over 60 Minutes Intravenous Every hour 12/25/21 2350 12/26/21 0216        Subjective: Patient walked earlier this morning without any complaints.  As soon as he came back to the room he started complaining of itchiness numbness around the mouth and blurred vision.  He has a history of previous stroke and still had residual symptoms of weakness on the left side. Old stroke was called patient was seen at bedside he was able to speak clearly when I was in the room with him.  Stat CT without contrast did not reveal any acute stroke discussed with the radiologist.  Objective: Vitals:   12/28/21 1214 12/28/21 2032 12/29/21 0448 12/29/21 0706  BP: (!) 141/89 110/73 130/83 (!) 143/90  Pulse: 68 67 64 81  Resp: '18 18 18 17  '$ Temp: 97.8 F (36.6 C) 98 F (36.7 C) 98.2 F (36.8 C) 98.2 F (36.8 C)  TempSrc: Oral Oral Oral Oral  SpO2: 94% 93% 93% 96%  Weight:      Height:        Intake/Output Summary (Last 24 hours) at 12/29/2021 1202 Last data filed at 12/29/2021 1037 Gross per 24 hour  Intake 1387.5 ml  Output 2175 ml  Net -787.5 ml    Filed Weights   12/25/21 2247 12/26/21 1319  Weight: 114 kg 118.9 kg    Examination:  General exam: Appears chronically ill-appearing and sick respiratory system: Clear to auscultation. Respiratory effort normal. Cardiovascular system: S1 & S2 heard, RRR. No JVD, murmurs, rubs, gallops or clicks. No pedal edema. Gastrointestinal system: Abdomen is distended, soft and right lsided tender. No organomegaly or masses felt. Normal bowel sounds heard. Central nervous system: Alert and oriented. No focal neurological  deficits. Extremities:no edema . Skin: No rashes, lesions or ulcers Psychiatry: Judgement and insight appear normal. Mood & affect appropriate.     Data Reviewed: I have personally reviewed following labs and imaging studies  CBC: Recent Labs  Lab 12/25/21 2258 12/26/21 1415 12/27/21 0404 12/28/21 0416 12/29/21 0345  WBC 18.8* 16.1* 14.1* 8.6 6.0  NEUTROABS 14.9*  --  10.8*  --   --   HGB 14.9 12.3* 12.2* 11.4* 11.0*  HCT 43.6 37.7* 37.1* 35.2* 34.1*  MCV 95.4 100.8* 99.2 100.0 100.9*  PLT 168 103* 107* 106* 119*    Basic Metabolic Panel: Recent Labs  Lab 12/25/21 2258 12/26/21 1415 12/27/21 0404 12/28/21 0416 12/29/21 0345  NA 139 138 144 143 145  K 3.7 3.3* 4.0 3.5 3.7  CL 104 109 113* 108 112*  CO2 20*  20* '22 26 24  '$ GLUCOSE 145* 123* 126* 119* 117*  BUN '14 17 18 23 21  '$ CREATININE 1.68* 1.69* 1.70* 1.83* 1.61*  CALCIUM 9.8 8.5* 8.7* 8.4* 8.3*    GFR: Estimated Creatinine Clearance: 62.5 mL/min (A) (by C-G formula based on SCr of 1.61 mg/dL (H)). Liver Function Tests: Recent Labs  Lab 12/25/21 2258 12/26/21 1415 12/27/21 0404 12/28/21 0416 12/29/21 0345  AST '17 16 15 '$ 14* 13*  ALT 43 33 '31 23 21  '$ ALKPHOS 43 41 50 49 41  BILITOT 1.7* 2.5* 2.1* 0.6 0.6  PROT 6.5 5.2* 5.6* 5.7* 5.2*  ALBUMIN 4.3 2.8* 2.9* 2.9* 2.7*    No results for input(s): "LIPASE", "AMYLASE" in the last 168 hours. No results for input(s): "AMMONIA" in the last 168 hours. Coagulation Profile: Recent Labs  Lab 12/25/21 2258  INR 1.0    Cardiac Enzymes: No results for input(s): "CKTOTAL", "CKMB", "CKMBINDEX", "TROPONINI" in the last 168 hours. BNP (last 3 results) No results for input(s): "PROBNP" in the last 8760 hours. HbA1C: No results for input(s): "HGBA1C" in the last 72 hours. CBG: Recent Labs  Lab 12/29/21 0703  GLUCAP 111*   Lipid Profile: No results for input(s): "CHOL", "HDL", "LDLCALC", "TRIG", "CHOLHDL", "LDLDIRECT" in the last 72 hours. Thyroid Function  Tests: No results for input(s): "TSH", "T4TOTAL", "FREET4", "T3FREE", "THYROIDAB" in the last 72 hours. Anemia Panel: No results for input(s): "VITAMINB12", "FOLATE", "FERRITIN", "TIBC", "IRON", "RETICCTPCT" in the last 72 hours. Sepsis Labs: Recent Labs  Lab 12/25/21 2257 12/26/21 0105 12/26/21 1415  LATICACIDVEN 3.6* 3.2* 1.6     Recent Results (from the past 240 hour(s))  Culture, blood (Routine x 2)     Status: None (Preliminary result)   Collection Time: 12/25/21 10:57 PM   Specimen: Right Antecubital; Blood  Result Value Ref Range Status   Specimen Description   Final    RIGHT ANTECUBITAL Performed at [facility], [place] [address], [place], [place] [address]    Special Requests   Final    NONE Performed at [facility], [address], [place], [place] [address]    Culture   Final    NO GROWTH 3 DAYS Performed at [facility], [place] [address] [place], [place] [address]    Report Status PENDING  Incomplete  Culture, blood (Routine x 2)     Status: None (Preliminary result)   Collection Time: 12/25/21 10:58 PM   Specimen: Left Antecubital; Blood  Result Value Ref Range Status   Specimen Description   Final    LEFT ANTECUBITAL Performed at [facility], [place] [address], [place], [place] [address]    Special Requests   Final    NONE Performed at [facility], [address], [place], [place] [address]    Culture   Final    NO GROWTH 3 DAYS Performed at [facility], [place] [address], [place], [place] [address]    Report Status PENDING  Incomplete  Urine Culture     Status: None   Collection Time: 12/26/21  7:20 AM   Specimen: Urine, Clean Catch  Result Value Ref Range Status   Specimen Description   Final    URINE, CLEAN CATCH Performed at [facility], [address] [place], [place] [address]    Special Requests   Final    NONE Performed at [facility],  [address], [place], [place] [address]    Culture  Final    NO GROWTH Performed at [facility], [place] [address], [place], [place] [address]    Report Status 12/27/2021 FINAL  Final         Radiology Studies: CT HEAD CODE STROKE WO CONTRAST  Addendum Date: 12/29/2021   ADDENDUM REPORT: 12/29/2021 09:35 ADDENDUM: Study discussed by telephone with Dr. Benjamine Mola Trameka Dorough on 12/29/2021 at 0918 hours. Electronically Signed   By: Genevie Ann M.D.   On: 12/29/2021 09:35   Result Date: 12/29/2021 CLINICAL DATA:  Code stroke.  66 year old male sepsis. EXAM: CT HEAD WITHOUT CONTRAST TECHNIQUE: Contiguous axial images were obtained from the base of the skull through the vertex without intravenous contrast. RADIATION DOSE REDUCTION: This exam was performed according to the departmental dose-optimization program which includes automated exposure control, adjustment of the mA and/or kV according to patient size and/or use of iterative reconstruction technique. COMPARISON:  Brain MRI 11/02/2021.  Head CT 11/02/2021. FINDINGS: Brain: Cerebral volume is within normal limits for age. No midline shift, ventriculomegaly, mass effect, evidence of mass lesion, intracranial hemorrhage or evidence of cortically based acute infarction. Patchy and confluent bilateral cerebral white matter hypodensity appears stable since May. Deep gray matter nuclei appear to remain spared. Vascular: Calcified atherosclerosis at the skull base. No suspicious intracranial vascular hyperdensity. Skull: No acute osseous abnormality identified. Sinuses/Orbits: Stable maxillary sinus retention cyst and mucosal thickening. Chronic right sphenoid sinus mucosal thickening or fluid is stable. Other Visualized paranasal sinuses and mastoids are stable and well aerated. Other: Visualized orbits and scalp soft tissues are within normal limits. ASPECTS [facility] Stroke Program Early CT Score) Total score (0-10 with 10 being normal): 10 IMPRESSION:  1. No acute cortically based infarct or acute intracranial hemorrhage identified. ASPECTS 10. 2. Stable non contrast CT appearance of white matter disease. Electronically Signed: By: Genevie Ann M.D. On: 12/29/2021 08:14   DG Abd 1 View  Result Date: 12/28/2021 CLINICAL DATA:  Abdominal pain EXAM: ABDOMEN - 1 VIEW COMPARISON:  03/13/2019 FINDINGS: There is mild dilation of few small bowel loops measuring up to 3.6 cm in diameter. Stomach is not distended. Small to moderate stool burden is seen in colon without evidence of fecal impaction in rectosigmoid. No abnormal masses or calcifications are seen. Kidneys are partly obscured by bowel contents. There is surgical fusion in lower lumbar spine. Heart is enlarged in size. Surgical clips are seen in the epigastrium and right upper quadrant. IMPRESSION: There is mild dilation of small bowel loops, possibly suggesting ileus. Other findings as described in the body of the report. Electronically Signed   By: Elmer Picker M.D.   On: 12/28/2021 15:41   US RENAL  Result Date: 12/28/2021 CLINICAL DATA:  Elevated serum creatinine EXAM: RENAL / URINARY TRACT ULTRASOUND COMPLETE COMPARISON:  None Available. FINDINGS: Right Kidney: Renal measurements: 11.6 x 5.5 x 4.9 cm = volume: 162.8 mL. Echogenicity within normal limits. No mass or hydronephrosis visualized. Left Kidney: Renal measurements: 11.3 x 5.7 x 4.7 cm = volume: 157.5 mL. Echogenicity within normal limits. No mass or hydronephrosis visualized. Bladder: Appears normal for degree of bladder distention. Other: None. IMPRESSION: No hydronephrosis. Electronically Signed   By: Macy Mis M.D.   On: 12/28/2021 08:37        Scheduled Meds:  amLODipine  5 mg Oral Daily   aspirin  325 mg Oral Daily   colchicine  0.6 mg Oral QODAY   dicyclomine  10 mg Oral TID AC   docusate sodium  200  mg Oral BID   enoxaparin (LOVENOX) injection  40 mg Subcutaneous Q24H   mycophenolate  250 mg Oral QHS    mycophenolate  500 mg Oral Daily   omeprazole  20 mg Oral Daily   predniSONE  10 mg Oral Q breakfast   rosuvastatin  5 mg Oral QPM   tacrolimus  0.5 mg Oral Daily   tacrolimus  1 mg Oral QHS   tamsulosin  0.4 mg Oral Daily   topiramate  50 mg Oral BID   Continuous Infusions:  sodium chloride 10 mL/hr at 12/26/21 1444   sodium chloride 75 mL/hr at 12/28/21 1930     LOS: 3 days    Time spent: 49 min  Georgette Shell, MD  12/29/2021, 12:02 PM

## 2021-12-29 NOTE — Progress Notes (Signed)
Patient notified nurse that he was having itching/burning/tingling sensations at different parts of his body, blurry vision, facial numbness, and headache. Patient's last known well time was 0600 after patient got in the chair after walking. Nurse notified on call provider, as well as Charge Nurse at [phone] and Rapid Response Nurse at 0700 to come see patient due to patient's current symptoms. Nurse got a set of vitals (see Flowsheets for [phone]) as well as a CBG, which was 111. Rapid Response did a stroke assessment at the patient's bedside. Patient did have slurred speech at the beginning of the stroke assessment; but once patient was given a wet/moist mouth swab, patient's speech became clearer. Patient's Attending was notified, and Attending arrived at the patient's bedside to evaluate. Attending gave new orders. Patient also started to come back around to his baseline before Attending got to the bedside.

## 2021-12-29 NOTE — Evaluation (Signed)
Physical Therapy One Time Evaluation Patient Details Name: Devin White MRN: [id number] DOB: July 02, 1955 Today's Date: 12/29/2021  History of Present Illness  66 year old male with history of recurrent UTI, BPH, C. difficile colitis in May 2023, GERD, gout, kidney stones, liver cirrhosis, liver transplant, osteoarthritis, Mallory-Weiss tear, obesity, mesenteric thrombosis, stage IIIa CKD, sleep apnea.  Per pt report, pt with hx of CVA and left residual deficits in May.  Pt admitted 12/25/21 with frequency dysuria and flank pain and abdominal pain with multiple episodes of loose stools since last few days prior to admission to the hospital.  He has been complaining of nausea and vomiting and diarrhea.  Clinical Impression  Patient evaluated by Physical Therapy with no further acute PT needs identified. All education has been completed and the patient has no further questions.  Pt very pleasant and cooperative.  Pt reports he was almost finished with his HHPT prior to this admission.  Pt reports CVA in May and has since stopped driving.  Pt lives with his daughter and has RW and rollator if needed.  Pt reports his daughter works, and he has many doctor appointments that he has been trying to coordinate transportation. See below for any follow-up Physical Therapy or equipment needs. PT is signing off. Thank you for this referral.        Recommendations for follow up therapy are one component of a multi-disciplinary discharge planning process, led by the attending physician.  Recommendations may be updated based on patient status, additional functional criteria and insurance authorization.  Follow Up Recommendations Home health PT (resume HHPT)      Assistance Recommended at Discharge    Patient can return home with the following  Assist for transportation    Equipment Recommendations None recommended by PT  Recommendations for Other Services       Functional Status Assessment Patient has had  a recent decline in their functional status and demonstrates the ability to make significant improvements in function in a reasonable and predictable amount of time.     Precautions / Restrictions Precautions Precautions: None      Mobility  Bed Mobility Overal bed mobility: Modified Independent                  Transfers Overall transfer level: Modified independent                      Ambulation/Gait Ambulation/Gait assistance: Supervision Gait Distance (Feet): 350 Feet Assistive device: None Gait Pattern/deviations: Step-through pattern, Decreased stride length, Antalgic, Knee flexed in stance - left       General Gait Details: pt reports mild SOB however states this is near baseline, pt with mildly antalgic gait and reports left LE weakness since CVA in May, observed increased left knee flexion in stance and pt reports hx of gout and pain in this knee, no overt LOB, pt also has RW and rollator at home if needed  Stairs            Wheelchair Mobility    Modified Rankin (Stroke Patients Only)       Balance Overall balance assessment:  (denies any recent falls)                                           Pertinent Vitals/Pain Pain Assessment Pain Assessment: No/denies pain    Home Living Family/patient  expects to be discharged to:: Private residence Living Arrangements: Children (daughter) Available Help at Discharge: Family;Available PRN/intermittently Type of Home: House Home Access: Stairs to enter Entrance Stairs-Rails: None Entrance Stairs-Number of Steps: 1   Home Layout: Able to live on main level with bedroom/bathroom Home Equipment: Conservation officer, nature (2 wheels);Rollator (4 wheels)      Prior Function Prior Level of Function : Independent/Modified Independent             Mobility Comments: no AD use ADLs Comments: no longer drives since previous CVA     Hand Dominance        Extremity/Trunk Assessment         Lower Extremity Assessment Lower Extremity Assessment: LLE deficits/detail LLE Deficits / Details: reports weakness since previous CVA, grossly appears at least 3+/5 throughout, pt lacking full knee extension and has flexed knee in stance (hx of gout and knee pain)       Communication   Communication: No difficulties  Cognition Arousal/Alertness: Awake/alert Behavior During Therapy: WFL for tasks assessed/performed Overall Cognitive Status: Within Functional Limits for tasks assessed                                          General Comments      Exercises     Assessment/Plan    PT Assessment All further PT needs can be met in the next venue of care  PT Problem List Decreased strength;Decreased mobility;Decreased activity tolerance       PT Treatment Interventions      PT Goals (Current goals can be found in the Care Plan section)  Acute Rehab PT Goals PT Goal Formulation: All assessment and education complete, DC therapy    Frequency       Co-evaluation               AM-PAC PT "6 Clicks" Mobility  Outcome Measure Help needed turning from your back to your side while in a flat bed without using bedrails?: None Help needed moving from lying on your back to sitting on the side of a flat bed without using bedrails?: None Help needed moving to and from a bed to a chair (including a wheelchair)?: A Little Help needed standing up from a chair using your arms (e.g., wheelchair or bedside chair)?: A Little Help needed to walk in hospital room?: A Little Help needed climbing 3-5 steps with a railing? : A Little 6 Click Score: 20    End of Session   Activity Tolerance: Patient tolerated treatment well Patient left: in bed;with call bell/phone within reach   PT Visit Diagnosis: Other abnormalities of gait and mobility (R26.89)    Time: 5188-4166 PT Time Calculation (min) (ACUTE ONLY): 24 min   Charges:   PT Evaluation $PT Eval Low  Complexity: 1 Low        Kati PT, DPT Physical Therapist Acute Rehabilitation Services Preferred contact method: Secure Chat Weekend Pager Only: [phone] Office: [phone]   Myrtis Hopping [place] 12/29/2021, 3:57 PM

## 2021-12-30 ENCOUNTER — Inpatient Hospital Stay (HOSPITAL_COMMUNITY): Payer: Medicare Other

## 2021-12-30 LAB — COMPREHENSIVE METABOLIC PANEL
ALT: 30 U/L (ref 0–44)
AST: 21 U/L (ref 15–41)
Albumin: 3.1 g/dL — ABNORMAL LOW (ref 3.5–5.0)
Alkaline Phosphatase: 44 U/L (ref 38–126)
Anion gap: 5 (ref 5–15)
BUN: 20 mg/dL (ref 8–23)
CO2: 23 mmol/L (ref 22–32)
Calcium: 8.8 mg/dL — ABNORMAL LOW (ref 8.9–10.3)
Chloride: 113 mmol/L — ABNORMAL HIGH (ref 98–111)
Creatinine, Ser: 1.52 mg/dL — ABNORMAL HIGH (ref 0.61–1.24)
GFR, Estimated: 51 mL/min — ABNORMAL LOW (ref >60)
Glucose, Bld: 131 mg/dL — ABNORMAL HIGH (ref 70–99)
Potassium: 3.8 mmol/L (ref 3.5–5.1)
Sodium: 141 mmol/L (ref 135–145)
Total Bilirubin: 0.3 mg/dL (ref 0.3–1.2)
Total Protein: 5.8 g/dL — ABNORMAL LOW (ref 6.5–8.1)

## 2021-12-30 LAB — CBC
HCT: 37.6 % — ABNORMAL LOW (ref 39.0–52.0)
Hemoglobin: 12.4 g/dL — ABNORMAL LOW (ref 13.0–17.0)
MCH: 32.5 pg (ref 26.0–34.0)
MCHC: 33 g/dL (ref 30.0–36.0)
MCV: 98.7 fL (ref 80.0–100.0)
Platelets: 141 10*3/uL — ABNORMAL LOW (ref 150–400)
RBC: 3.81 MIL/uL — ABNORMAL LOW (ref 4.22–5.81)
RDW: 14 % (ref 11.5–15.5)
WBC: 6.4 10*3/uL (ref 4.0–10.5)
nRBC: 0 % (ref 0.0–0.2)

## 2021-12-30 MED ORDER — LACTULOSE 10 GM/15ML PO SOLN
30.0000 g | Freq: Once | ORAL | Status: AC
  Administered 2021-12-30: 30 g via ORAL
  Filled 2021-12-30: qty 60

## 2021-12-30 MED ORDER — SORBITOL 70 % SOLN
300.0000 mL | TOPICAL_OIL | Freq: Once | ORAL | Status: DC
  Filled 2021-12-30: qty 90

## 2021-12-30 MED ORDER — COLCHICINE 0.6 MG PO TABS: 0.6000 mg | ORAL_TABLET | Freq: Once | ORAL | Status: DC | PRN

## 2021-12-30 NOTE — Progress Notes (Signed)
PROGRESS NOTE    TIMON GEISSINGER  JKD:[id number] DOB: Nov 02, 1955 DOA: 12/25/2021 PCP: Venia Carbon, MD   Brief Narrative: 66 year old male with history of recurrent UTI, BPH, C. difficile colitis in May 2023, GERD, gout, kidney stones, liver cirrhosis, liver transplant, osteoarthritis, Mallory-Weiss tear, obesity, mesenteric thrombosis, stage IIIa CKD, sleep apnea admitted with frequency dysuria and flank pain and abdominal pain with multiple episodes of loose stools since last few days prior to admission to the hospital.  He has been complaining of nausea and vomiting and diarrhea. 12/27/2021 discussed with urologist who will see patient as an outpatient for BPH and kidney stones.kidney stones are not obstructive less concern if this would be causing an infection. CT abdomen and pelvis shows punctuate bilateral nephrolithiasis with no hydronephrosis.  Sigmoid diverticulosis.  Patient was tachypneic tachycardic with leukocytosis and lactic acidosis on admission. Assessment & Plan:   Principal Problem:   Severe sepsis ([place]) Active Problems:   GERD   BPH without urinary obstruction   Hypertension   Sleep apnea   Liver transplant status ([place])   Chronic diastolic heart failure ([facility])   Diarrhea   Stage 3a chronic kidney disease (CKD) ([facility])   Hyperbilirubinemia   Acute UTI (urinary tract infection)   Hypokalemia  #1 sepsis ruled out -he presented with tachypnea and tachycardia leukocytosis and lactic acidosis.  Blood cultures remain negative.  Urine culture shows no growth.  He does not have any more diarrhea in fact he is constipated with abdominal distention.  Antibiotics have been stopped and is being monitored.  He remains afebrile.  #2 BPH continue Flomax  He did not have any rest we will in the bladder scan.  #3 history of essential hypertension - holding Lasix and Zestril continue Norvasc blood pressure soft  #4 obstructive sleep apnea on CPAP  #5 history of liver transplant  on immunosuppressants and prednisone at home  #6 history of chronic diastolic heart failure stable  #7  AKI on stage IIIa CKD stable monitor closely holding diuresis and ACE inhibitor with diarrhea nausea and vomiting CT scan and renal ultrasound shows no hydronephrosis Renal functions remained stable and improved  #8 hyperlipidemia on Crestor  #9 gout on colchicine  #10 nausea vomiting diarrhea-resolved  stool C. difficile and GI panel negative  #11 thrombocytopenia-resolved stable on Lovenox  #12 ileus patient complains of constipation has not moved the bowels since admission.  He was given laxatives yesterday with no benefit.  We will try smog enema Dulcolax and lactulose today.  KUB shows findings consistent with ileus.  Change diet to full liquid diet.  Estimated body mass index is 35.55 kg/m as calculated from the following:   Height as of this encounter: 6' (1.829 m).   Weight as of this encounter: 118.9 kg.  DVT prophylaxis:lovenox Code Status: full Family Communication: none Disposition Plan:  Status is: Inpatient Remains inpatient appropriate because: Sepsis   Consultants:  None  Procedures: None Antimicrobials:  Anti-infectives (From admission, onward)    Start     Dose/Rate Route Frequency Ordered Stop   12/27/21 1800  cefTRIAXone (ROCEPHIN) 1 g in sodium chloride 0.9 % 100 mL IVPB  Status:  Discontinued        1 g 200 mL/hr over 30 Minutes Intravenous Every 24 hours 12/27/21 1445 12/27/21 1522   12/27/21 1800  cefTRIAXone (ROCEPHIN) 2 g in sodium chloride 0.9 % 100 mL IVPB  Status:  Discontinued        2 g 200 mL/hr over  30 Minutes Intravenous Every 24 hours 12/27/21 1522 12/28/21 1522   12/26/21 1800  metroNIDAZOLE (FLAGYL) IVPB 500 mg  Status:  Discontinued        500 mg 100 mL/hr over 60 Minutes Intravenous Every 12 hours 12/26/21 1344 12/28/21 1522   12/26/21 1430  ceFEPIme (MAXIPIME) 2 g in sodium chloride 0.9 % 100 mL IVPB  Status:  Discontinued         2 g 200 mL/hr over 30 Minutes Intravenous Every 12 hours 12/26/21 1343 12/27/21 1445   12/26/21 0000  ceFEPIme (MAXIPIME) 2 g in sodium chloride 0.9 % 100 mL IVPB        2 g 200 mL/hr over 30 Minutes Intravenous  Once 12/25/21 2346 12/26/21 0041   12/26/21 0000  metroNIDAZOLE (FLAGYL) IVPB 500 mg        500 mg 100 mL/hr over 60 Minutes Intravenous  Once 12/25/21 2346 12/26/21 0223   12/26/21 0000  vancomycin (VANCOCIN) IVPB 1000 mg/200 mL premix  Status:  Discontinued        1,000 mg 200 mL/hr over 60 Minutes Intravenous  Once 12/25/21 2346 12/25/21 2350   12/26/21 0000  vancomycin (VANCOCIN) IVPB 1000 mg/200 mL premix        1,000 mg 200 mL/hr over 60 Minutes Intravenous Every hour 12/25/21 2350 12/26/21 0216        Subjective:  He has not had a BM since admission complains of abdominal distention he is able to pass gas.  Complains of nausea not able to eat or keep food down  Objective: Vitals:   12/29/21 1207 12/29/21 1734 12/29/21 1954 12/30/21 0607  BP: 136/86 131/84 136/77 134/90  Pulse: 67 72 68 63  Resp: '14 19 18 20  '$ Temp: 98 F (36.7 C) 97.9 F (36.6 C) 97.7 F (36.5 C) (!) 97.4 F (36.3 C)  TempSrc: Oral Oral Oral Oral  SpO2: 92% 94% 93% 95%  Weight:      Height:        Intake/Output Summary (Last 24 hours) at 12/30/2021 1219 Last data filed at 12/30/2021 1203 Gross per 24 hour  Intake 1320 ml  Output 1650 ml  Net -330 ml    Filed Weights   12/25/21 2247 12/26/21 1319  Weight: 114 kg 118.9 kg    Examination:  General exam: Appears chronically ill-appearing and sick respiratory system: Clear to auscultation. Respiratory effort normal. Cardiovascular system: S1 & S2 heard, RRR. No JVD, murmurs, rubs, gallops or clicks. No pedal edema. Gastrointestinal system: Abdomen is distended, soft and right lsided tender. No organomegaly or masses felt. Normal bowel sounds heard. Central nervous system: Alert and oriented. No focal neurological  deficits. Extremities:no edema . Skin: No rashes, lesions or ulcers Psychiatry: Judgement and insight appear normal. Mood & affect appropriate.     Data Reviewed: I have personally reviewed following labs and imaging studies  CBC: Recent Labs  Lab 12/25/21 2258 12/26/21 1415 12/27/21 0404 12/28/21 0416 12/29/21 0345 12/30/21 0340  WBC 18.8* 16.1* 14.1* 8.6 6.0 6.4  NEUTROABS 14.9*  --  10.8*  --   --   --   HGB 14.9 12.3* 12.2* 11.4* 11.0* 12.4*  HCT 43.6 37.7* 37.1* 35.2* 34.1* 37.6*  MCV 95.4 100.8* 99.2 100.0 100.9* 98.7  PLT 168 103* 107* 106* 119* 141*    Basic Metabolic Panel: Recent Labs  Lab 12/26/21 1415 12/27/21 0404 12/28/21 0416 12/29/21 0345 12/30/21 0340  NA 138 144 143 145 141  K 3.3* 4.0 3.5 3.7  3.8  CL 109 113* 108 112* 113*  CO2 20* '22 26 24 23  '$ GLUCOSE 123* 126* 119* 117* 131*  BUN '17 18 23 21 20  '$ CREATININE 1.69* 1.70* 1.83* 1.61* 1.52*  CALCIUM 8.5* 8.7* 8.4* 8.3* 8.8*    GFR: Estimated Creatinine Clearance: 66.2 mL/min (A) (by C-G formula based on SCr of 1.52 mg/dL (H)). Liver Function Tests: Recent Labs  Lab 12/26/21 1415 12/27/21 0404 12/28/21 0416 12/29/21 0345 12/30/21 0340  AST 16 15 14* 13* 21  ALT 33 '31 23 21 30  '$ ALKPHOS 41 50 49 41 44  BILITOT 2.5* 2.1* 0.6 0.6 0.3  PROT 5.2* 5.6* 5.7* 5.2* 5.8*  ALBUMIN 2.8* 2.9* 2.9* 2.7* 3.1*    No results for input(s): "LIPASE", "AMYLASE" in the last 168 hours. No results for input(s): "AMMONIA" in the last 168 hours. Coagulation Profile: Recent Labs  Lab 12/25/21 2258  INR 1.0    Cardiac Enzymes: No results for input(s): "CKTOTAL", "CKMB", "CKMBINDEX", "TROPONINI" in the last 168 hours. BNP (last 3 results) No results for input(s): "PROBNP" in the last 8760 hours. HbA1C: No results for input(s): "HGBA1C" in the last 72 hours. CBG: Recent Labs  Lab 12/29/21 0703  GLUCAP 111*    Lipid Profile: No results for input(s): "CHOL", "HDL", "LDLCALC", "TRIG", "CHOLHDL",  "LDLDIRECT" in the last 72 hours. Thyroid Function Tests: No results for input(s): "TSH", "T4TOTAL", "FREET4", "T3FREE", "THYROIDAB" in the last 72 hours. Anemia Panel: No results for input(s): "VITAMINB12", "FOLATE", "FERRITIN", "TIBC", "IRON", "RETICCTPCT" in the last 72 hours. Sepsis Labs: Recent Labs  Lab 12/25/21 2257 12/26/21 0105 12/26/21 1415  LATICACIDVEN 3.6* 3.2* 1.6     Recent Results (from the past 240 hour(s))  Culture, blood (Routine x 2)     Status: None (Preliminary result)   Collection Time: 12/25/21 10:57 PM   Specimen: Right Antecubital; Blood  Result Value Ref Range Status   Specimen Description   Final    RIGHT ANTECUBITAL Performed at [facility], [place] [address] [place], [place] [address]    Special Requests   Final    NONE Performed at [facility], [address] [place], [place] [address]    Culture   Final    NO GROWTH 4 DAYS Performed at [facility], [place] [address], [place], [place] [address]    Report Status PENDING  Incomplete  Culture, blood (Routine x 2)     Status: None (Preliminary result)   Collection Time: 12/25/21 10:58 PM   Specimen: Left Antecubital; Blood  Result Value Ref Range Status   Specimen Description   Final    LEFT ANTECUBITAL Performed at [facility], [place] [address], [place], [place] [address]    Special Requests   Final    NONE Performed at [facility], [address] [place], [place] [address]    Culture   Final    NO GROWTH 4 DAYS Performed at [facility], [place] [address], [place], [place] [address]    Report Status PENDING  Incomplete  Urine Culture     Status: None   Collection Time: 12/26/21  7:20 AM   Specimen: Urine, Clean Catch  Result Value Ref Range Status   Specimen Description   Final    URINE, CLEAN CATCH Performed at [facility], [address] [place], [place] [address]    Special Requests   Final     NONE Performed at [facility],  [address] [place], [place] [address]    Culture   Final    NO GROWTH Performed at [facility], [place] [address], [place],  [address]    Report Status 12/27/2021 FINAL  Final         Radiology Studies: DG Abd 1 View  Result Date: 12/30/2021 CLINICAL DATA:  Abdominal pain EXAM: ABDOMEN - 1 VIEW COMPARISON:  12/28/2021 FINDINGS: There are a few mildly dilated loops of small bowel primarily within the mid left abdomen, similar in caliber the previous radiograph. There is air and enteric contrast present within the colon. No gross free intraperitoneal air. IMPRESSION: Persistent mildly dilated loops of small bowel primarily within the mid left abdomen. There is air and enteric contrast present within the colon. Appearance favors ileus or enteritis. Electronically Signed   By: Davina Poke D.O.   On: 12/30/2021 11:58   CT HEAD CODE STROKE WO CONTRAST  Addendum Date: 12/29/2021   ADDENDUM REPORT: 12/29/2021 09:35 ADDENDUM: Study discussed by telephone with Dr. Benjamine Mola Sherrian Nunnelley on 12/29/2021 at 0918 hours. Electronically Signed   By: Genevie Ann M.D.   On: 12/29/2021 09:35   Result Date: 12/29/2021 CLINICAL DATA:  Code stroke.  65 year old male sepsis. EXAM: CT HEAD WITHOUT CONTRAST TECHNIQUE: Contiguous axial images were obtained from the base of the skull through the vertex without intravenous contrast. RADIATION DOSE REDUCTION: This exam was performed according to the departmental dose-optimization program which includes automated exposure control, adjustment of the mA and/or kV according to patient size and/or use of iterative reconstruction technique. COMPARISON:  Brain MRI 11/02/2021.  Head CT 11/02/2021. FINDINGS: Brain: Cerebral volume is within normal limits for age. No midline shift, ventriculomegaly, mass effect, evidence of mass lesion, intracranial hemorrhage or evidence of cortically based acute infarction. Patchy and  confluent bilateral cerebral white matter hypodensity appears stable since May. Deep gray matter nuclei appear to remain spared. Vascular: Calcified atherosclerosis at the skull base. No suspicious intracranial vascular hyperdensity. Skull: No acute osseous abnormality identified. Sinuses/Orbits: Stable maxillary sinus retention cyst and mucosal thickening. Chronic right sphenoid sinus mucosal thickening or fluid is stable. Other Visualized paranasal sinuses and mastoids are stable and well aerated. Other: Visualized orbits and scalp soft tissues are within normal limits. ASPECTS [facility] Stroke Program Early CT Score) Total score (0-10 with 10 being normal): 10 IMPRESSION: 1. No acute cortically based infarct or acute intracranial hemorrhage identified. ASPECTS 10. 2. Stable non contrast CT appearance of white matter disease. Electronically Signed: By: Genevie Ann M.D. On: 12/29/2021 08:14   DG Abd 1 View  Result Date: 12/28/2021 CLINICAL DATA:  Abdominal pain EXAM: ABDOMEN - 1 VIEW COMPARISON:  03/13/2019 FINDINGS: There is mild dilation of few small bowel loops measuring up to 3.6 cm in diameter. Stomach is not distended. Small to moderate stool burden is seen in colon without evidence of fecal impaction in rectosigmoid. No abnormal masses or calcifications are seen. Kidneys are partly obscured by bowel contents. There is surgical fusion in lower lumbar spine. Heart is enlarged in size. Surgical clips are seen in the epigastrium and right upper quadrant. IMPRESSION: There is mild dilation of small bowel loops, possibly suggesting ileus. Other findings as described in the body of the report. Electronically Signed   By: Elmer Picker M.D.   On: 12/28/2021 15:41        Scheduled Meds:  amLODipine  5 mg Oral Daily   aspirin  325 mg Oral Daily   bisacodyl  10 mg  Rectal Once   colchicine  0.6 mg Oral QODAY   dicyclomine  10 mg Oral TID AC   docusate sodium  200 mg Oral BID   enoxaparin (LOVENOX)  injection  40 mg Subcutaneous Q24H   mycophenolate  250 mg Oral QHS   mycophenolate  500 mg Oral Daily   omeprazole  20 mg Oral Daily   polyethylene glycol  17 g Oral Daily   predniSONE  10 mg Oral Q breakfast   rosuvastatin  5 mg Oral QPM   senna  2 tablet Oral Daily   sorbitol, milk of mag, mineral oil, glycerin (SMOG) enema  300 mL Rectal Once   tacrolimus  0.5 mg Oral Daily   tacrolimus  1 mg Oral QHS   tamsulosin  0.4 mg Oral Daily   topiramate  50 mg Oral BID   Continuous Infusions:  sodium chloride 10 mL/hr at 12/26/21 1444     LOS: 4 days    Time spent: 73 min  Georgette Shell, MD  12/30/2021, 12:19 PM

## 2021-12-31 ENCOUNTER — Other Ambulatory Visit (HOSPITAL_COMMUNITY): Payer: Self-pay

## 2021-12-31 LAB — CULTURE, BLOOD (ROUTINE X 2)
Culture: NO GROWTH
Culture: NO GROWTH

## 2021-12-31 LAB — COMPREHENSIVE METABOLIC PANEL
ALT: 32 U/L (ref 0–44)
AST: 19 U/L (ref 15–41)
Albumin: 3.3 g/dL — ABNORMAL LOW (ref 3.5–5.0)
Alkaline Phosphatase: 43 U/L (ref 38–126)
Anion gap: 8 (ref 5–15)
BUN: 17 mg/dL (ref 8–23)
CO2: 21 mmol/L — ABNORMAL LOW (ref 22–32)
Calcium: 8.8 mg/dL — ABNORMAL LOW (ref 8.9–10.3)
Chloride: 113 mmol/L — ABNORMAL HIGH (ref 98–111)
Creatinine, Ser: 1.23 mg/dL (ref 0.61–1.24)
GFR, Estimated: >60 mL/min (ref >60)
Glucose, Bld: 101 mg/dL — ABNORMAL HIGH (ref 70–99)
Potassium: 3.4 mmol/L — ABNORMAL LOW (ref 3.5–5.1)
Sodium: 142 mmol/L (ref 135–145)
Total Bilirubin: 0.6 mg/dL (ref 0.3–1.2)
Total Protein: 5.8 g/dL — ABNORMAL LOW (ref 6.5–8.1)

## 2021-12-31 LAB — CBC
HCT: 36.7 % — ABNORMAL LOW (ref 39.0–52.0)
Hemoglobin: 12.4 g/dL — ABNORMAL LOW (ref 13.0–17.0)
MCH: 32.5 pg (ref 26.0–34.0)
MCHC: 33.8 g/dL (ref 30.0–36.0)
MCV: 96.3 fL (ref 80.0–100.0)
Platelets: 153 10*3/uL (ref 150–400)
RBC: 3.81 MIL/uL — ABNORMAL LOW (ref 4.22–5.81)
RDW: 13.8 % (ref 11.5–15.5)
WBC: 7.8 10*3/uL (ref 4.0–10.5)
nRBC: 0 % (ref 0.0–0.2)

## 2021-12-31 MED ORDER — LISINOPRIL 5 MG PO TABS
2.5000 mg | ORAL_TABLET | Freq: Every day | ORAL | 2 refills | Status: DC
  Filled 2021-12-31: qty 30, 60d supply, fill #0

## 2021-12-31 MED ORDER — AMLODIPINE BESYLATE 5 MG PO TABS
2.5000 mg | ORAL_TABLET | Freq: Every day | ORAL | 2 refills | Status: DC
  Filled 2021-12-31: qty 30, 60d supply, fill #0

## 2021-12-31 MED ORDER — ONDANSETRON HCL 4 MG PO TABS
4.0000 mg | ORAL_TABLET | Freq: Four times a day (QID) | ORAL | 0 refills | Status: DC | PRN
  Filled 2021-12-31: qty 20, 5d supply, fill #0

## 2021-12-31 MED ORDER — SENNA 8.6 MG PO TABS
2.0000 | ORAL_TABLET | Freq: Every day | ORAL | 0 refills | Status: DC
  Filled 2021-12-31: qty 120, 60d supply, fill #0

## 2021-12-31 NOTE — Care Management Important Message (Signed)
Important Message  Patient Details IM Letter given to the Patient. Name: Devin White MRN: [id number] Date of Birth: 1956/03/05   Medicare Important Message Given:  Yes     Kerin Salen 12/31/2021, 1:18 PM

## 2022-01-01 ENCOUNTER — Telehealth: Payer: Medicare Other

## 2022-01-01 ENCOUNTER — Telehealth: Payer: Self-pay

## 2022-01-01 NOTE — Discharge Summary (Addendum)
Physician Discharge Summary  Devin White BPZ:[id number] DOB: 1956/03/23 DOA: 12/25/2021  PCP: Venia Carbon, MD  Admit date: 12/25/2021 Discharge date:12/31/21  Admitted From: home Disposition:  home  Recommendations for Outpatient Follow-up:  Follow up with PCP in 1-2 weeks Please obtain BMP/CBC in one week   Home Health: None Equipment/Devices: None  Discharge Condition: Stable CODE STATUS: Full code Diet recommendation: Cardiac Brief/Interim Summary:  66 year old male with history of recurrent UTI, BPH, C. difficile colitis in May 2023, GERD, gout, kidney stones, liver cirrhosis, liver transplant, osteoarthritis, Mallory-Weiss tear, obesity, mesenteric thrombosis, stage IIIa CKD, sleep apnea admitted with frequency dysuria and flank pain and abdominal pain with multiple episodes of loose stools since last few days prior to admission to the hospital.  He has been complaining of nausea and vomiting and diarrhea. 12/27/2021 discussed with urologist who will see patient as an outpatient for BPH and kidney stones.kidney stones are not obstructive less concern if this would be causing an infection. CT abdomen and pelvis shows punctuate bilateral nephrolithiasis with no hydronephrosis.  Sigmoid diverticulosis.   Patient was tachypneic tachycardic with leukocytosis and lactic acidosis on admission. Assessment & Plan: Discharge Diagnoses:  Principal Problem:   Severe sepsis ([place]) Active Problems:   GERD   BPH without urinary obstruction   Hypertension   Sleep apnea   Liver transplant status ([place])   Chronic diastolic heart failure (H[facility])   Diarrhea   Stage 3a chronic kidney disease (CKD) ([facility])   Hyperbilirubinemia   Acute UTI (urinary tract infection)   Hypokalemia    #1 sepsis ruled out -he presented with tachypnea and tachycardia leukocytosis and lactic acidosis.  Blood cultures remain negative.  Urine culture shows no growth.  He does not have any more diarrhea in fact he  is constipated with abdominal distention.  Antibiotics have been stopped and  was being monitored.  He remained afebrile.  #2 BPH continue Flomax    #3 history of essential hypertension -continue home meds.  #4 obstructive sleep apnea on CPAP   #5 history of liver transplant on immunosuppressants and prednisone at home   #6 history of chronic diastolic heart failure stable   #7  AKI on stage IIIa CKD stable.  CT scan and renal ultrasound shows no hydronephrosis Renal functions remained stable and improved   #8 hyperlipidemia on Crestor   #9 gout on colchicine   #10 nausea vomiting diarrhea-resolved  stool C. difficile and GI panel negative   #11 thrombocytopenia-resolved stable on Lovenox   #12 ileus resolved on discharge.  He was able to tolerate a diet  Estimated body mass index is 35.55 kg/m as calculated from the following:   Height as of this encounter: 6' (1.829 m).   Weight as of this encounter: 118.9 kg.  Discharge Instructions  Discharge Instructions     Call MD for:  difficulty breathing, headache or visual disturbances   Complete by: As directed    Call MD for:  persistant nausea and vomiting   Complete by: As directed    Call MD for:  severe uncontrolled pain   Complete by: As directed    Diet - low sodium heart healthy   Complete by: As directed    Increase activity slowly   Complete by: As directed       Allergies as of 12/31/2021   No Known Allergies      Medication List     STOP taking these medications    predniSONE 10 MG tablet Commonly  known as: DELTASONE       TAKE these medications    acetaminophen 325 MG tablet Commonly known as: TYLENOL Take 650 mg by mouth every 6 (six) hours as needed for mild pain.   amLODipine 5 MG tablet Commonly known as: NORVASC Take 1/2 tablet (2.5 mg total) by mouth daily. What changed: how much to take   ascorbic acid 500 MG tablet Commonly known as: VITAMIN C Take 1 tablet (500 mg total) by  mouth daily.   aspirin 325 MG tablet Take 1 tablet (325 mg total) by mouth daily.   calcium carbonate 500 MG chewable tablet Commonly known as: Tums Chew 1 tablet (200 mg of elemental calcium total) by mouth 2 (two) times daily as needed for indigestion or heartburn.   colchicine 0.6 MG tablet Take 1 tablet (0.6 mg total) by mouth daily as needed. What changed: when to take this   diclofenac Sodium 1 % Gel Commonly known as: VOLTAREN Apply 4 g topically 4 (four) times daily. What changed:  when to take this reasons to take this   feeding supplement (GLUCERNA SHAKE) Liqd Take 237 mLs by mouth 2 (two) times daily between meals.   furosemide 40 MG tablet Commonly known as: LASIX Take 1 tablet (40 mg total) by mouth daily.   lisinopril 5 MG tablet Commonly known as: ZESTRIL Take 1/2 tablet (2.5 mg total) by mouth daily. What changed: how much to take   multivitamin with minerals Tabs tablet Take 1 tablet by mouth daily.   mycophenolate 250 MG capsule Commonly known as: CELLCEPT Take 250-500 mg by mouth 2 (two) times daily. 2 capsules (500 mg) in the morning and one capsule (250 mg) in the evening   omeprazole 20 MG capsule Commonly known as: PRILOSEC TAKE 1 CAPSULE BY MOUTH TWICE DAILY What changed:  how much to take how to take this when to take this   ondansetron 4 MG tablet Commonly known as: ZOFRAN Take 1 tablet (4 mg total) by mouth every 6 (six) hours as needed for nausea.   rosuvastatin 5 MG tablet Commonly known as: CRESTOR Take 1 tablet (5 mg total) by mouth every evening.   senna 8.6 MG Tabs tablet Commonly known as: SENOKOT Take 2 tablets (17.2 mg total) by mouth daily.   SUMAtriptan 100 MG tablet Commonly known as: IMITREX Take 1 tablet (100 mg total) by mouth once as needed for migraine (may repeat x 1 in 24 hrs not more than 2 days per week. total 9 tablets). May repeat in 2 hours if headache persists or recurs.   tacrolimus 0.5 MG  capsule Commonly known as: PROGRAF Take 0.5-1 mg by mouth See admin instructions. Taking 0.5 mg in the morning and 1 mg at bedtime   tamsulosin 0.4 MG Caps capsule Commonly known as: FLOMAX TAKE 1 CAPSULE(0.4 MG) BY MOUTH DAILY What changed: See the new instructions.   topiramate 50 MG tablet Commonly known as: TOPAMAX Take 1 tablet (50 mg total) by mouth 2 (two) times daily.   vitamin A 3 MG (10000 UNITS) capsule Take 1 capsule (10,000 Units total) by mouth daily.   zinc sulfate 220 (50 Zn) MG capsule Take 1 capsule (220 mg total) by mouth daily.        No Known Allergies  Consultations: None   Procedures/Studies: DG Abd 1 View  Result Date: 12/30/2021 CLINICAL DATA:  Abdominal pain EXAM: ABDOMEN - 1 VIEW COMPARISON:  12/28/2021 FINDINGS: There are a few mildly dilated loops of  small bowel primarily within the mid left abdomen, similar in caliber the previous radiograph. There is air and enteric contrast present within the colon. No gross free intraperitoneal air. IMPRESSION: Persistent mildly dilated loops of small bowel primarily within the mid left abdomen. There is air and enteric contrast present within the colon. Appearance favors ileus or enteritis. Electronically Signed   By: Davina Poke D.O.   On: 12/30/2021 11:58   CT HEAD CODE STROKE WO CONTRAST  Addendum Date: 12/29/2021   ADDENDUM REPORT: 12/29/2021 09:35 ADDENDUM: Study discussed by telephone with Dr. Benjamine Mola Morrie Daywalt on 12/29/2021 at 0918 hours. Electronically Signed   By: Genevie Ann M.D.   On: 12/29/2021 09:35   Result Date: 12/29/2021 CLINICAL DATA:  Code stroke.  66 year old male sepsis. EXAM: CT HEAD WITHOUT CONTRAST TECHNIQUE: Contiguous axial images were obtained from the base of the skull through the vertex without intravenous contrast. RADIATION DOSE REDUCTION: This exam was performed according to the departmental dose-optimization program which includes automated exposure control, adjustment of the mA  and/or kV according to patient size and/or use of iterative reconstruction technique. COMPARISON:  Brain MRI 11/02/2021.  Head CT 11/02/2021. FINDINGS: Brain: Cerebral volume is within normal limits for age. No midline shift, ventriculomegaly, mass effect, evidence of mass lesion, intracranial hemorrhage or evidence of cortically based acute infarction. Patchy and confluent bilateral cerebral white matter hypodensity appears stable since May. Deep gray matter nuclei appear to remain spared. Vascular: Calcified atherosclerosis at the skull base. No suspicious intracranial vascular hyperdensity. Skull: No acute osseous abnormality identified. Sinuses/Orbits: Stable maxillary sinus retention cyst and mucosal thickening. Chronic right sphenoid sinus mucosal thickening or fluid is stable. Other Visualized paranasal sinuses and mastoids are stable and well aerated. Other: Visualized orbits and scalp soft tissues are within normal limits. ASPECTS [facility] Stroke Program Early CT Score) Total score (0-10 with 10 being normal): 10 IMPRESSION: 1. No acute cortically based infarct or acute intracranial hemorrhage identified. ASPECTS 10. 2. Stable non contrast CT appearance of white matter disease. Electronically Signed: By: Genevie Ann M.D. On: 12/29/2021 08:14   DG Abd 1 View  Result Date: 12/28/2021 CLINICAL DATA:  Abdominal pain EXAM: ABDOMEN - 1 VIEW COMPARISON:  03/13/2019 FINDINGS: There is mild dilation of few small bowel loops measuring up to 3.6 cm in diameter. Stomach is not distended. Small to moderate stool burden is seen in colon without evidence of fecal impaction in rectosigmoid. No abnormal masses or calcifications are seen. Kidneys are partly obscured by bowel contents. There is surgical fusion in lower lumbar spine. Heart is enlarged in size. Surgical clips are seen in the epigastrium and right upper quadrant. IMPRESSION: There is mild dilation of small bowel loops, possibly suggesting ileus. Other findings  as described in the body of the report. Electronically Signed   By: Elmer Picker M.D.   On: 12/28/2021 15:41   US RENAL  Result Date: 12/28/2021 CLINICAL DATA:  Elevated serum creatinine EXAM: RENAL / URINARY TRACT ULTRASOUND COMPLETE COMPARISON:  None Available. FINDINGS: Right Kidney: Renal measurements: 11.6 x 5.5 x 4.9 cm = volume: 162.8 mL. Echogenicity within normal limits. No mass or hydronephrosis visualized. Left Kidney: Renal measurements: 11.3 x 5.7 x 4.7 cm = volume: 157.5 mL. Echogenicity within normal limits. No mass or hydronephrosis visualized. Bladder: Appears normal for degree of bladder distention. Other: None. IMPRESSION: No hydronephrosis. Electronically Signed   By: Macy Mis M.D.   On: 12/28/2021 08:37   CT ABDOMEN PELVIS WO CONTRAST  Result Date:  12/26/2021 CLINICAL DATA:  LLQ abdominal pain EXAM: CT ABDOMEN AND PELVIS WITHOUT CONTRAST TECHNIQUE: Multidetector CT imaging of the abdomen and pelvis was performed following the standard protocol without IV contrast. RADIATION DOSE REDUCTION: This exam was performed according to the departmental dose-optimization program which includes automated exposure control, adjustment of the mA and/or kV according to patient size and/or use of iterative reconstruction technique. COMPARISON:  10/29/2021 FINDINGS: Lower chest: No acute abnormality Hepatobiliary: Prior cholecystectomy.  No focal hepatic abnormality. Pancreas: No focal abnormality or ductal dilatation. Spleen: No focal abnormality.  Normal size. Adrenals/Urinary Tract: Cortical thinning and scarring bilaterally. Small bilateral low-density lesions in the kidneys, likely cysts, unchanged. No follow-up imaging recommended. Punctate nonobstructing stones in the kidneys bilaterally. No hydronephrosis. Adrenal glands and urinary bladder unremarkable. Stomach/Bowel: Few scattered sigmoid diverticula. No active diverticulitis. Stomach and small bowel decompressed. Prior  appendectomy. Vascular/Lymphatic: Aortic atherosclerosis. No evidence of aneurysm or adenopathy. Reproductive: No visible focal abnormality. Other: No free fluid or free air. Musculoskeletal: No acute bony abnormality. Postoperative changes from posterior fusion in the lower lumbar spine. IMPRESSION: No acute findings in the abdomen or pelvis. Punctate bilateral nephrolithiasis.  No hydronephrosis. Scattered sigmoid diverticulosis. Aortic atherosclerosis. Electronically Signed   By: Rolm Baptise M.D.   On: 12/26/2021 02:45   DG Chest Port 1 View  Result Date: 12/26/2021 CLINICAL DATA:  Sepsis EXAM: PORTABLE CHEST 1 VIEW COMPARISON:  10/27/2011 FINDINGS: Low lung volumes. Borderline cardiomegaly. No edema, pleural effusion or pneumothorax IMPRESSION: No active disease.  Low lung volumes Electronically Signed   By: Donavan Foil M.D.   On: 12/26/2021 00:43   (Echo, Carotid, EGD, Colonoscopy, ERCP)    Subjective: Patient is resting in bed anxious to go home  Discharge Exam: Vitals:   12/30/21 2036 12/31/21 0514  BP: 125/83 (!) 129/93  Pulse: 61 69  Resp: (!) 24 14  Temp: (!) 97.4 F (36.3 C) 98.3 F (36.8 C)  SpO2: 96% 96%   Vitals:   12/30/21 1317 12/30/21 1630 12/30/21 2036 12/31/21 0514  BP: (!) 165/107 (!) 142/95 125/83 (!) 129/93  Pulse: 68 72 61 69  Resp: 20 19 (!) 24 14  Temp: 97.7 F (36.5 C)  (!) 97.4 F (36.3 C) 98.3 F (36.8 C)  TempSrc: Oral  Oral Oral  SpO2: 98%  96% 96%  Weight:      Height:        General: Pt is alert, awake, not in acute distress Cardiovascular: RRR, S1/S2 +, no rubs, no gallops Respiratory: CTA bilaterally, no wheezing, no rhonchi Abdominal: Soft, NT, ND, bowel sounds + Extremities: no edema, no cyanosis    The results of significant diagnostics from this hospitalization (including imaging, microbiology, ancillary and laboratory) are listed below for reference.     Microbiology: Recent Results (from the past 240 hour(s))  Culture, blood  (Routine x 2)     Status: None   Collection Time: 12/25/21 10:57 PM   Specimen: Right Antecubital; Blood  Result Value Ref Range Status   Specimen Description   Final    RIGHT ANTECUBITAL Performed at [facility], [place] [address], [place], [place] [address]    Special Requests   Final    NONE Performed at [facility], [address] [place], [place] [address]    Culture   Final    NO GROWTH 5 DAYS Performed at [facility], [place] [address] [place], [place] [address]    Report Status 12/31/2021 FINAL  Final  Culture, blood (Routine x 2)     Status: None   Collection Time: 12/25/21 10:58 PM   Specimen: Left Antecubital; Blood  Result Value Ref Range Status   Specimen Description   Final    LEFT ANTECUBITAL Performed at [facility], [place] [address] [place], [place] [address]    Special Requests   Final    NONE Performed at [facility], [address], [place], [place] [address]    Culture   Final    NO GROWTH 5 DAYS Performed at [facility], [place] [address] [place], [place] [address]    Report Status 12/31/2021 FINAL  Final  Urine Culture     Status: None   Collection Time: 12/26/21  7:20 AM   Specimen: Urine, Clean Catch  Result Value Ref Range Status   Specimen Description   Final    URINE, CLEAN CATCH Performed at [facility], [address], [place], [place] [address]    Special Requests   Final    NONE Performed at [facility], [address] [place],  [address]    Culture   Final    NO GROWTH Performed at [facility], [place] [address], [place],  [address]    Report Status 12/27/2021 FINAL  Final     Labs: BNP (last 3 results) Recent Labs    09/14/21 1534  BNP 42.7   Basic Metabolic Panel: Recent Labs  Lab 12/27/21 0404 12/28/21 0416 12/29/21 0345 12/30/21 0340 12/31/21 0349  NA 144 143 145 141 142  K 4.0 3.5 3.7 3.8  3.4*  CL 113* 108 112* 113* 113*  CO2 '22 26 24 23 '$ 21*  GLUCOSE 126* 119* 117* 131* 101*  BUN '18 23 21 20 17  '$ CREATININE 1.70* 1.83* 1.61* 1.52* 1.23  CALCIUM 8.7* 8.4* 8.3* 8.8* 8.8*   Liver Function Tests: Recent Labs  Lab 12/27/21 0404 12/28/21 0416 12/29/21 0345 12/30/21 0340 12/31/21 0349  AST 15 14* 13* 21 19  ALT '31 23 21 30 '$ 32  ALKPHOS 50 49 41 44 43  BILITOT 2.1* 0.6 0.6 0.3 0.6  PROT 5.6* 5.7* 5.2* 5.8* 5.8*  ALBUMIN 2.9* 2.9* 2.7* 3.1* 3.3*   No results for input(s): "LIPASE", "AMYLASE" in the last 168 hours. No results for input(s): "AMMONIA" in the last 168 hours. CBC: Recent Labs  Lab 12/25/21 2258 12/26/21 1415 12/27/21 0404 12/28/21 0416 12/29/21 0345 12/30/21 0340 12/31/21 0349  WBC 18.8*   < > 14.1* 8.6 6.0 6.4 7.8  NEUTROABS 14.9*  --  10.8*  --   --   --   --   HGB 14.9   < > 12.2* 11.4* 11.0* 12.4* 12.4*  HCT 43.6   < > 37.1* 35.2* 34.1* 37.6* 36.7*  MCV 95.4   < > 99.2 100.0 100.9* 98.7 96.3  PLT 168   < > 107* 106* 119* 141* 153   < > = values in this interval not displayed.   Cardiac Enzymes: No results for input(s): "CKTOTAL", "CKMB", "CKMBINDEX", "TROPONINI" in the last 168 hours. BNP: Invalid input(s): "POCBNP" CBG: Recent Labs  Lab 12/29/21 0703  GLUCAP 111*   D-Dimer No results for input(s): "DDIMER" in the last 72 hours. Hgb A1c No results for input(s): "HGBA1C" in the last 72 hours. Lipid Profile No results for input(s): "CHOL", "HDL", "LDLCALC", "TRIG", "CHOLHDL", "LDLDIRECT" in the last 72 hours. Thyroid function studies No results for input(s): "TSH", "T4TOTAL", "T3FREE", "THYROIDAB" in  the last 72 hours.  Invalid input(s): "FREET3" Anemia work up No results for input(s): "VITAMINB12", "FOLATE", "FERRITIN", "TIBC", "IRON", "RETICCTPCT" in the last 72 hours. Urinalysis    Component Value Date/Time   COLORURINE YELLOW 12/25/2021 2257   APPEARANCEUR HAZY (A) 12/25/2021 2257   APPEARANCEUR Clear 11/30/2013 1959    LABSPEC 1.017 12/25/2021 2257   LABSPEC 1.017 11/30/2013 1959   PHURINE 5.0 12/25/2021 2257   GLUCOSEU NEGATIVE 12/25/2021 2257   GLUCOSEU Negative 11/30/2013 1959   HGBUR SMALL (A) 12/25/2021 2257   HGBUR negative 05/25/2010 1153   BILIRUBINUR NEGATIVE 12/25/2021 2257   BILIRUBINUR Negative 11/30/2013 1959   KETONESUR NEGATIVE 12/25/2021 2257   PROTEINUR 30 (A) 12/25/2021 2257   UROBILINOGEN 0.2 08/25/2012 2256   NITRITE NEGATIVE 12/25/2021 2257   LEUKOCYTESUR LARGE (A) 12/25/2021 2257   LEUKOCYTESUR Negative 11/30/2013 1959   Sepsis Labs Recent Labs  Lab 12/28/21 0416 12/29/21 0345 12/30/21 0340 12/31/21 0349  WBC 8.6 6.0 6.4 7.8   Microbiology Recent Results (from the past 240 hour(s))  Culture, blood (Routine x 2)     Status: None   Collection Time: 12/25/21 10:57 PM   Specimen: Right Antecubital; Blood  Result Value Ref Range Status   Specimen Description   Final    RIGHT ANTECUBITAL Performed at [facility], [place] [address] [place], [place] [address]    Special Requests   Final    NONE Performed at [facility], [address], [place], [place] [address]    Culture   Final    NO GROWTH 5 DAYS Performed at [facility], [place] [address] [place], [place] [address]    Report Status 12/31/2021 FINAL  Final  Culture, blood (Routine x 2)     Status: None   Collection Time: 12/25/21 10:58 PM   Specimen: Left Antecubital; Blood  Result Value Ref Range Status   Specimen Description   Final    LEFT ANTECUBITAL Performed at [facility], [place] [address], [place], [place] [address]    Special Requests   Final    NONE Performed at [facility], [address] [place], [place] [address]    Culture   Final    NO GROWTH 5 DAYS Performed at [facility], [place] [address] [place], [place] [address]    Report Status 12/31/2021 FINAL  Final  Urine Culture     Status: None   Collection Time: 12/26/21  7:20  AM   Specimen: Urine, Clean Catch  Result Value Ref Range Status   Specimen Description   Final    URINE, CLEAN CATCH Performed at [facility], [address] [place], [place] [address]    Special Requests   Final    NONE Performed at [facility], [address] [place], [place] [address]    Culture   Final    NO GROWTH Performed at [facility], [place] [address] [place],  [address]    Report Status 12/27/2021 FINAL  Final     Time coordinating discharge: 39 minutes  SIGNED:  Georgette Shell, MD  [facility] 01/01/2022, 5:34 PM

## 2022-01-01 NOTE — Telephone Encounter (Signed)
Transition Care Management Follow-up Telephone Call Date of discharge and from where: 12/31/21,  How have you been since you were released from the hospital? better Any questions or concerns? No  Items Reviewed: Did the pt receive and understand the discharge instructions provided? Yes  Medications obtained and verified? Yes  Other? No  Any new allergies since your discharge? No  Dietary orders reviewed? No Do you have support at home? Yes   Home Care and Equipment/Supplies: Were home health services ordered? No, but pt already has PT HH If so, what is the name of the agency? He is not sure of the company  Has the agency set up a time to come to the patient's home? not applicable Were any new equipment or medical supplies ordered?  No What is the name of the medical supply agency? N/a Were you able to get the supplies/equipment? not applicable Do you have any questions related to the use of the equipment or supplies? No  Functional Questionnaire: (I = Independent and D = Dependent) ADLs: I  Bathing/Dressing- I  Meal Prep- I  Eating- I  Maintaining continence- I  Transferring/Ambulation- I  Managing Meds- I  Follow up appointments reviewed:  PCP Hospital f/u appt confirmed? Yes  Scheduled to see Dr. Silvio Pate on 01/03/22 @ 4:00, pt requested virtual visit. [facility] f/u appt confirmed? Yes Scheduled to see Dr. Loletha Grayer, (GI) on 01/09/22 @ 11:40 . Are transportation arrangements needed? No  If their condition worsens, is the pt aware to call PCP or go to the Emergency Dept.? Yes Was the patient provided with contact information for the PCP's office or ED? Yes Was to pt encouraged to call back with questions or concerns? Yes

## 2022-01-03 ENCOUNTER — Encounter: Payer: Self-pay | Admitting: Internal Medicine

## 2022-01-03 ENCOUNTER — Telehealth (INDEPENDENT_AMBULATORY_CARE_PROVIDER_SITE_OTHER): Payer: Medicare Other | Admitting: Internal Medicine

## 2022-01-03 VITALS — BP 140/86 | Temp 97.6°F | Ht 71.0 in | Wt 258.0 lb

## 2022-01-03 DIAGNOSIS — N390 Urinary tract infection, site not specified: Secondary | ICD-10-CM

## 2022-01-03 DIAGNOSIS — Z9181 History of falling: Secondary | ICD-10-CM | POA: Diagnosis not present

## 2022-01-03 DIAGNOSIS — I10 Essential (primary) hypertension: Secondary | ICD-10-CM | POA: Diagnosis not present

## 2022-01-03 DIAGNOSIS — M479 Spondylosis, unspecified: Secondary | ICD-10-CM | POA: Diagnosis not present

## 2022-01-03 DIAGNOSIS — M1990 Unspecified osteoarthritis, unspecified site: Secondary | ICD-10-CM | POA: Diagnosis not present

## 2022-01-03 DIAGNOSIS — Z7982 Long term (current) use of aspirin: Secondary | ICD-10-CM | POA: Diagnosis not present

## 2022-01-03 DIAGNOSIS — N1831 Chronic kidney disease, stage 3a: Secondary | ICD-10-CM | POA: Diagnosis not present

## 2022-01-03 DIAGNOSIS — K7210 Chronic hepatic failure without coma: Secondary | ICD-10-CM | POA: Diagnosis not present

## 2022-01-03 DIAGNOSIS — K219 Gastro-esophageal reflux disease without esophagitis: Secondary | ICD-10-CM | POA: Diagnosis not present

## 2022-01-03 DIAGNOSIS — Z7952 Long term (current) use of systemic steroids: Secondary | ICD-10-CM | POA: Diagnosis not present

## 2022-01-03 DIAGNOSIS — D631 Anemia in chronic kidney disease: Secondary | ICD-10-CM | POA: Diagnosis not present

## 2022-01-03 DIAGNOSIS — M1A9XX1 Chronic gout, unspecified, with tophus (tophi): Secondary | ICD-10-CM

## 2022-01-03 DIAGNOSIS — N281 Cyst of kidney, acquired: Secondary | ICD-10-CM | POA: Diagnosis not present

## 2022-01-03 DIAGNOSIS — I5032 Chronic diastolic (congestive) heart failure: Secondary | ICD-10-CM | POA: Diagnosis not present

## 2022-01-03 DIAGNOSIS — I130 Hypertensive heart and chronic kidney disease with heart failure and stage 1 through stage 4 chronic kidney disease, or unspecified chronic kidney disease: Secondary | ICD-10-CM | POA: Diagnosis not present

## 2022-01-03 DIAGNOSIS — Z944 Liver transplant status: Secondary | ICD-10-CM | POA: Diagnosis not present

## 2022-01-03 NOTE — Progress Notes (Signed)
Subjective:    Patient ID: Devin White, male    DOB: May 26, 1956, 66 y.o.   MRN: [id number]  HPI Video virtual visit for hospital follow up Identification done Reviewed limitations and billing and he gave consent Participants--patient in his home and I am in my office  Had some diarrhea--but also urinary frequency and bad dysuria Went 30 times and was dribbling Was tachycardic, tachypneic----diagnosed with sepsis/UTI Empiric antibiotics----urine cultures were negative though (as well as all the rest of the cultures)---so antibiotics stop Small non obstructive stones seen on CT scan (kidneys) BP very low at times---90's/50's No more dysuria--but still feels soreness down in bladder area  Had mild diarrhea--but that resolved after getting to the hospital Then needed help to move bowels KUB did show distended bowels like ileus---but   Current Outpatient Medications on File Prior to Visit  Medication Sig Dispense Refill   acetaminophen (TYLENOL) 325 MG tablet Take 650 mg by mouth every 6 (six) hours as needed for mild pain.     amLODipine (NORVASC) 5 MG tablet Take 1/2 tablet (2.5 mg total) by mouth daily. 30 tablet 2   ascorbic acid (VITAMIN C) 500 MG tablet Take 1 tablet (500 mg total) by mouth daily.     aspirin 325 MG tablet Take 1 tablet (325 mg total) by mouth daily. 30 tablet 3   calcium carbonate (TUMS) 500 MG chewable tablet Chew 1 tablet (200 mg of elemental calcium total) by mouth 2 (two) times daily as needed for indigestion or heartburn. 180 tablet 1   colchicine 0.6 MG tablet Take 1 tablet (0.6 mg total) by mouth daily as needed. (Patient taking differently: Take 0.6 mg by mouth every other day.) 90 tablet 3   diclofenac Sodium (VOLTAREN) 1 % GEL Apply 4 g topically 4 (four) times daily. (Patient taking differently: Apply 4 g topically 2 (two) times daily as needed (pain).) 100 g 5   furosemide (LASIX) 40 MG tablet Take 1 tablet (40 mg total) by mouth daily. 90 tablet 3    lisinopril (ZESTRIL) 5 MG tablet Take 1/2 tablet (2.5 mg total) by mouth daily. 30 tablet 2   Multiple Vitamin (MULTIVITAMIN WITH MINERALS) TABS tablet Take 1 tablet by mouth daily.     mycophenolate (CELLCEPT) 250 MG capsule Take 250-500 mg by mouth 2 (two) times daily. 2 capsules (500 mg) in the morning and one capsule (250 mg) in the evening     omeprazole (PRILOSEC) 20 MG capsule TAKE 1 CAPSULE BY MOUTH TWICE DAILY (Patient taking differently: daily.) 180 capsule 3   ondansetron (ZOFRAN) 4 MG tablet Take 1 tablet (4 mg total) by mouth every 6 (six) hours as needed for nausea. 20 tablet 0   predniSONE (DELTASONE) 10 MG tablet Take 10 mg by mouth daily with breakfast.     rosuvastatin (CRESTOR) 5 MG tablet Take 1 tablet (5 mg total) by mouth every evening. 30 tablet 0   senna (SENOKOT) 8.6 MG TABS tablet Take 2 tablets (17.2 mg total) by mouth daily. 120 tablet 0   SUMAtriptan (IMITREX) 100 MG tablet Take 1 tablet (100 mg total) by mouth once as needed for migraine (may repeat x 1 in 24 hrs not more than 2 days per week. total 9 tablets). May repeat in 2 hours if headache persists or recurs. 9 tablet 0   tacrolimus (PROGRAF) 0.5 MG capsule Take 0.5-1 mg by mouth See admin instructions. Taking 0.5 mg in the morning and 1 mg at bedtime  tamsulosin (FLOMAX) 0.4 MG CAPS capsule TAKE 1 CAPSULE(0.4 MG) BY MOUTH DAILY (Patient taking differently: Take 0.4 mg by mouth daily.) 90 capsule 3   topiramate (TOPAMAX) 50 MG tablet Take 1 tablet (50 mg total) by mouth 2 (two) times daily. 60 tablet 1   vitamin A 3 MG (10000 UNITS) capsule Take 1 capsule (10,000 Units total) by mouth daily. 30 capsule    zinc sulfate 220 (50 Zn) MG capsule Take 1 capsule (220 mg total) by mouth daily.     feeding supplement, GLUCERNA SHAKE, (GLUCERNA SHAKE) LIQD Take 237 mLs by mouth 2 (two) times daily between meals. (Patient not taking: Reported on 01/03/2022) 14220 mL 1   No current facility-administered medications on file  prior to visit.    No Known Allergies  Past Medical History:  Diagnosis Date   Arthritis    back and legs   Benign prostatic hypertrophy    GERD (gastroesophageal reflux disease)    Gout    History of blood transfusion    pt has antibodies in his blood since previous transfusions   History of cirrhosis of liver S/P TRANSPLANT 2009   History of liver failure S/P TRANSPLANT   Hypertension    Left ureteral calculus     Past Surgical History:  Procedure Laterality Date   APPENDECTOMY     bone morrow biopsy     CHOLECYSTECTOMY  2007   CYSTO/ LEFT RETROGRADE PYELOGRAM/ LEFT URETERAL STENT PLACEMENT  03-05-2012  DR Tresa Moore [facility])   LEFT URETERAL CALCULI   CYSTOSCOPY W/ URETERAL STENT PLACEMENT  03/11/2012   Procedure: CYSTOSCOPY WITH STENT REPLACEMENT;  Surgeon: Alexis Frock, MD;  Location: [facility];  Service: Urology;  Laterality: Left;   HERNIA REPAIR  2008   LIVER BIOPSY     LIVER TRANSPLANT  11/24/2007   pt states doing well since liver transplant   LUMBAR DISC SURGERY     LUMBAR FUSION     removal of fistula     URETEROSCOPY  03/11/2012   Procedure: URETEROSCOPY;  Surgeon: Alexis Frock, MD;  Location: [facility];  Service: Urology;  Laterality: Left;   STONE MANIPULATION, stone obtained  [place] [facility]    Family History  Problem Relation Age of Onset   Cancer Mother        colon   Arthritis Mother    Vasculitis Mother    Hypertension Mother    Cancer Father        colon   Kidney disease Father    Arthritis Father    Arthritis Brother    Alcohol abuse Maternal Aunt    Diabetes Maternal Aunt    Alcohol abuse Maternal Uncle    Diabetes Paternal Aunt    Alcohol abuse Maternal Uncle    Alcohol abuse Maternal Uncle     Social History   Socioeconomic History   Marital status: Single    Spouse name: Not on file   Number of children: 3   Years of education: Not on file   Highest education level: Not on file  Occupational  History   Occupation: disabled    Employer: RETIRED  Tobacco Use   Smoking status: Never    Passive exposure: Past   Smokeless tobacco: Never  Vaping Use   Vaping Use: Not on file  Substance and Sexual Activity   Alcohol use: No   Drug use: No   Sexual activity: Not on file  Other Topics Concern   Not on  file  Social History Narrative   Has living will   Requests daughter [name] as his health care POA   Would accept brief attempt at resuscitation but no prolonged ventilation   No tube feeds if cognitively unaware   Social Determinants of Health   Financial Resource Strain: Low Risk  (07/04/2021)   Overall Financial Resource Strain (CARDIA)    Difficulty of Paying Living Expenses: Not very hard  Food Insecurity: Not on file  Transportation Needs: Not on file  Physical Activity: Not on file  Stress: Not on file  Social Connections: Not on file  Intimate Partner Violence: Not on file   Review of Systems Had dry mouth and cracked lips --?from the antibiotics No SOB. Slight dry cough--this is better Eating fair--still not much appetite Bowels are fine now No rash No gout symptoms BP now back up to 140/80's     Objective:   Physical Exam Constitutional:      Appearance: Normal appearance.  Pulmonary:     Effort: Pulmonary effort is normal. No respiratory distress.  Abdominal:     Comments: Notes soreness when he presses on his abdomen  Neurological:     Mental Status: He is alert.  Psychiatric:        Mood and Affect: Mood normal.        Behavior: Behavior normal.            Assessment & Plan:

## 2022-01-03 NOTE — Assessment & Plan Note (Signed)
BP Readings from Last 3 Encounters:  01/03/22 140/86  12/31/21 (!) 129/93  11/14/21 128/78   Back up again He will increase the amlodipine back from 2.5 to '5mg'$  daily

## 2022-01-03 NOTE — Assessment & Plan Note (Signed)
Finally quiet Hasn't needed the colchicine

## 2022-01-03 NOTE — Assessment & Plan Note (Signed)
Had severe dysuria, etc Surely seemed to have clear UTI--but culture negative Mostly better now--just mild soreness  May need to consider going back to urologist The nephrolithiasis is not new and small/non obstructing (so no need to go for that)

## 2022-01-03 NOTE — Assessment & Plan Note (Deleted)
Continues on his immunosuppressive regimen---mycophenolate 500/250, prednisone 10 daily

## 2022-01-03 NOTE — Assessment & Plan Note (Signed)
Continues on his immunosuppressive regimen---mycophenolate 500/250, prednisone 10 daily

## 2022-01-09 DIAGNOSIS — R519 Headache, unspecified: Secondary | ICD-10-CM | POA: Diagnosis not present

## 2022-01-09 DIAGNOSIS — D849 Immunodeficiency, unspecified: Secondary | ICD-10-CM | POA: Diagnosis not present

## 2022-01-09 DIAGNOSIS — N390 Urinary tract infection, site not specified: Secondary | ICD-10-CM | POA: Diagnosis not present

## 2022-01-09 DIAGNOSIS — D72829 Elevated white blood cell count, unspecified: Secondary | ICD-10-CM | POA: Diagnosis not present

## 2022-01-09 DIAGNOSIS — Z944 Liver transplant status: Secondary | ICD-10-CM | POA: Diagnosis not present

## 2022-01-24 NOTE — Progress Notes (Signed)
Called patient to reschedule his appointment. Patient has been scheduled for a telephone appointment with Charlene Brooke on 02/11/22 at 2:15.  Charlene Brooke, CPP notified  Marijean Niemann, [place] Clinical Pharmacy Assistant [phone]

## 2022-01-28 ENCOUNTER — Telehealth: Payer: Self-pay

## 2022-01-28 DIAGNOSIS — Z944 Liver transplant status: Secondary | ICD-10-CM | POA: Diagnosis not present

## 2022-01-28 DIAGNOSIS — D849 Immunodeficiency, unspecified: Secondary | ICD-10-CM | POA: Diagnosis not present

## 2022-01-28 NOTE — Progress Notes (Signed)
Chronic Care Management Pharmacy Assistant   Name: Devin White  MRN: [id number] DOB: 1956/04/25  Reason for Encounter: CCM (Hosptial Follow Up)  Medications: Outpatient Encounter Medications as of 01/28/2022  Medication Sig   acetaminophen (TYLENOL) 325 MG tablet Take 650 mg by mouth every 6 (six) hours as needed for mild pain.   amLODipine (NORVASC) 5 MG tablet Take 1/2 tablet (2.5 mg total) by mouth daily.   ascorbic acid (VITAMIN C) 500 MG tablet Take 1 tablet (500 mg total) by mouth daily.   aspirin 325 MG tablet Take 1 tablet (325 mg total) by mouth daily.   calcium carbonate (TUMS) 500 MG chewable tablet Chew 1 tablet (200 mg of elemental calcium total) by mouth 2 (two) times daily as needed for indigestion or heartburn.   colchicine 0.6 MG tablet Take 1 tablet (0.6 mg total) by mouth daily as needed. (Patient taking differently: Take 0.6 mg by mouth every other day.)   diclofenac Sodium (VOLTAREN) 1 % GEL Apply 4 g topically 4 (four) times daily. (Patient taking differently: Apply 4 g topically 2 (two) times daily as needed (pain).)   furosemide (LASIX) 40 MG tablet Take 1 tablet (40 mg total) by mouth daily.   lisinopril (ZESTRIL) 5 MG tablet Take 1/2 tablet (2.5 mg total) by mouth daily.   Multiple Vitamin (MULTIVITAMIN WITH MINERALS) TABS tablet Take 1 tablet by mouth daily.   mycophenolate (CELLCEPT) 250 MG capsule Take 250-500 mg by mouth 2 (two) times daily. 2 capsules (500 mg) in the morning and one capsule (250 mg) in the evening   omeprazole (PRILOSEC) 20 MG capsule TAKE 1 CAPSULE BY MOUTH TWICE DAILY (Patient taking differently: daily.)   ondansetron (ZOFRAN) 4 MG tablet Take 1 tablet (4 mg total) by mouth every 6 (six) hours as needed for nausea.   predniSONE (DELTASONE) 10 MG tablet Take 10 mg by mouth daily with breakfast.   rosuvastatin (CRESTOR) 5 MG tablet Take 1 tablet (5 mg total) by mouth every evening.   senna (SENOKOT) 8.6 MG TABS tablet Take 2 tablets  (17.2 mg total) by mouth daily.   SUMAtriptan (IMITREX) 100 MG tablet Take 1 tablet (100 mg total) by mouth once as needed for migraine (may repeat x 1 in 24 hrs not more than 2 days per week. total 9 tablets). May repeat in 2 hours if headache persists or recurs.   tacrolimus (PROGRAF) 0.5 MG capsule Take 0.5-1 mg by mouth See admin instructions. Taking 0.5 mg in the morning and 1 mg at bedtime   tamsulosin (FLOMAX) 0.4 MG CAPS capsule TAKE 1 CAPSULE(0.4 MG) BY MOUTH DAILY (Patient taking differently: Take 0.4 mg by mouth daily.)   topiramate (TOPAMAX) 50 MG tablet Take 1 tablet (50 mg total) by mouth 2 (two) times daily.   vitamin A 3 MG (10000 UNITS) capsule Take 1 capsule (10,000 Units total) by mouth daily.   zinc sulfate 220 (50 Zn) MG capsule Take 1 capsule (220 mg total) by mouth daily.   No facility-administered encounter medications on file as of 01/28/2022.   Reviewed hospital notes for details of recent visit. Patient has been contacted by Transitions of Care team: Yes  Admitted to the hospital on 12/26/21. Discharge date was 12/31/21.  Discharged from [facility].   Discharge diagnosis (Principal Problem): Severe sepsis  Patient was discharged to Home  Brief summary of hospital course: 66 year old male with history of recurrent UTI, BPH, C. difficile colitis in May 2023, GERD, gout,  kidney stones, liver cirrhosis, liver transplant, osteoarthritis, Mallory-Weiss tear, obesity, mesenteric thrombosis, stage IIIa CKD, sleep apnea admitted with frequency dysuria and flank pain and abdominal pain with multiple episodes of loose stools since last few days prior to admission to the hospital.  He has been complaining of nausea and vomiting and diarrhea. 12/27/2021 discussed with urologist who will see patient as an outpatient for BPH and kidney stones.kidney stones are not obstructive less concern if this would be causing an infection. CT abdomen and pelvis shows punctuate bilateral  nephrolithiasis with no hydronephrosis.  Sigmoid diverticulosis.  New?Medications Started at [facility] Discharge:?? -Started ondansetron (ZOFRAN) 4 MG tablet -Started senna (SENOKOT) 8.6 MG TABS tablet  Medication Changes at Hospital Discharge: -Changed amLODipine (NORVASC) 5 MG tablet -Changed lisinopril (ZESTRIL) 5 MG tablet  Medications Discontinued at Hospital Discharge: -Stopped predniSONE 10 MG tablet (DELTASONE)  Medications that remain the same after Hospital Discharge:??  -All other medications will remain the same.    Next CCM appt: 02/11/22  Other upcoming appts: PCP appointment on 05/08/22 for Physical  Charlene Brooke, PharmD notified and will determine if action is needed.  Charlene Brooke, CPP notified  Marijean Niemann, [place] Clinical Pharmacy Assistant [phone]

## 2022-01-28 NOTE — Progress Notes (Signed)
Patient has follow up appointment with Charlene Brooke on 02/11/2022.  Charlene Brooke, CPP notified  Marijean Niemann, [place] Clinical Pharmacy Assistant [phone]

## 2022-01-29 ENCOUNTER — Encounter: Payer: Self-pay | Admitting: Emergency Medicine

## 2022-01-29 ENCOUNTER — Inpatient Hospital Stay
Admission: EM | Admit: 2022-01-29 | Discharge: 2022-02-02 | DRG: 872 | Disposition: A | Discharge status: Home / Self Care | Payer: Medicare Other | Attending: Internal Medicine | Admitting: Internal Medicine

## 2022-01-29 ENCOUNTER — Other Ambulatory Visit: Payer: Self-pay

## 2022-01-29 DIAGNOSIS — N200 Calculus of kidney: Secondary | ICD-10-CM | POA: Diagnosis not present

## 2022-01-29 DIAGNOSIS — M109 Gout, unspecified: Secondary | ICD-10-CM | POA: Diagnosis present

## 2022-01-29 DIAGNOSIS — N12 Tubulo-interstitial nephritis, not specified as acute or chronic: Secondary | ICD-10-CM

## 2022-01-29 DIAGNOSIS — N390 Urinary tract infection, site not specified: Secondary | ICD-10-CM | POA: Diagnosis present

## 2022-01-29 DIAGNOSIS — Z7982 Long term (current) use of aspirin: Secondary | ICD-10-CM | POA: Diagnosis not present

## 2022-01-29 DIAGNOSIS — Z8249 Family history of ischemic heart disease and other diseases of the circulatory system: Secondary | ICD-10-CM | POA: Diagnosis not present

## 2022-01-29 DIAGNOSIS — Z87440 Personal history of urinary (tract) infections: Secondary | ICD-10-CM

## 2022-01-29 DIAGNOSIS — K219 Gastro-esophageal reflux disease without esophagitis: Secondary | ICD-10-CM | POA: Diagnosis present

## 2022-01-29 DIAGNOSIS — I517 Cardiomegaly: Secondary | ICD-10-CM | POA: Diagnosis not present

## 2022-01-29 DIAGNOSIS — N1831 Chronic kidney disease, stage 3a: Secondary | ICD-10-CM | POA: Diagnosis not present

## 2022-01-29 DIAGNOSIS — A4151 Sepsis due to Escherichia coli [E. coli]: Secondary | ICD-10-CM | POA: Diagnosis not present

## 2022-01-29 DIAGNOSIS — A419 Sepsis, unspecified organism: Secondary | ICD-10-CM

## 2022-01-29 DIAGNOSIS — D84821 Immunodeficiency due to drugs: Secondary | ICD-10-CM | POA: Diagnosis present

## 2022-01-29 DIAGNOSIS — N10 Acute tubulo-interstitial nephritis: Secondary | ICD-10-CM

## 2022-01-29 DIAGNOSIS — N400 Enlarged prostate without lower urinary tract symptoms: Secondary | ICD-10-CM | POA: Diagnosis present

## 2022-01-29 DIAGNOSIS — E669 Obesity, unspecified: Secondary | ICD-10-CM | POA: Diagnosis present

## 2022-01-29 DIAGNOSIS — I129 Hypertensive chronic kidney disease with stage 1 through stage 4 chronic kidney disease, or unspecified chronic kidney disease: Secondary | ICD-10-CM | POA: Diagnosis not present

## 2022-01-29 DIAGNOSIS — Z6833 Body mass index (BMI) 33.0-33.9, adult: Secondary | ICD-10-CM

## 2022-01-29 DIAGNOSIS — Z8673 Personal history of transient ischemic attack (TIA), and cerebral infarction without residual deficits: Secondary | ICD-10-CM

## 2022-01-29 DIAGNOSIS — I10 Essential (primary) hypertension: Secondary | ICD-10-CM | POA: Diagnosis present

## 2022-01-29 DIAGNOSIS — Z944 Liver transplant status: Secondary | ICD-10-CM | POA: Diagnosis not present

## 2022-01-29 DIAGNOSIS — Z841 Family history of disorders of kidney and ureter: Secondary | ICD-10-CM | POA: Diagnosis not present

## 2022-01-29 DIAGNOSIS — E876 Hypokalemia: Secondary | ICD-10-CM | POA: Diagnosis not present

## 2022-01-29 DIAGNOSIS — G4733 Obstructive sleep apnea (adult) (pediatric): Secondary | ICD-10-CM | POA: Diagnosis not present

## 2022-01-29 DIAGNOSIS — Z7952 Long term (current) use of systemic steroids: Secondary | ICD-10-CM | POA: Diagnosis not present

## 2022-01-29 DIAGNOSIS — Z79899 Other long term (current) drug therapy: Secondary | ICD-10-CM | POA: Diagnosis not present

## 2022-01-29 DIAGNOSIS — Z0389 Encounter for observation for other suspected diseases and conditions ruled out: Secondary | ICD-10-CM | POA: Diagnosis not present

## 2022-01-29 DIAGNOSIS — R9431 Abnormal electrocardiogram [ECG] [EKG]: Secondary | ICD-10-CM | POA: Diagnosis not present

## 2022-01-29 LAB — CBC WITH DIFFERENTIAL/PLATELET
Abs Immature Granulocytes: 0.13 10*3/uL — ABNORMAL HIGH (ref 0.00–0.07)
Basophils Absolute: 0 10*3/uL (ref 0.0–0.1)
Basophils Relative: 0 %
Eosinophils Absolute: 0 10*3/uL (ref 0.0–0.5)
Eosinophils Relative: 0 %
HCT: 43 % (ref 39.0–52.0)
Hemoglobin: 14.3 g/dL (ref 13.0–17.0)
Immature Granulocytes: 1 %
Lymphocytes Relative: 9 %
Lymphs Abs: 1.9 10*3/uL (ref 0.7–4.0)
MCH: 31.8 pg (ref 26.0–34.0)
MCHC: 33.3 g/dL (ref 30.0–36.0)
MCV: 95.8 fL (ref 80.0–100.0)
Monocytes Absolute: 1.5 10*3/uL — ABNORMAL HIGH (ref 0.1–1.0)
Monocytes Relative: 7 %
Neutro Abs: 17.6 10*3/uL — ABNORMAL HIGH (ref 1.7–7.7)
Neutrophils Relative %: 83 %
Platelets: 175 10*3/uL (ref 150–400)
RBC: 4.49 MIL/uL (ref 4.22–5.81)
RDW: 14.3 % (ref 11.5–15.5)
WBC: 21.2 10*3/uL — ABNORMAL HIGH (ref 4.0–10.5)
nRBC: 0 % (ref 0.0–0.2)

## 2022-01-29 LAB — URINALYSIS, ROUTINE W REFLEX MICROSCOPIC
Bilirubin Urine: NEGATIVE
Glucose, UA: NEGATIVE mg/dL
Hgb urine dipstick: NEGATIVE
Ketones, ur: NEGATIVE mg/dL
Nitrite: NEGATIVE
Protein, ur: NEGATIVE mg/dL
Specific Gravity, Urine: 1.013 (ref 1.005–1.030)
WBC, UA: >50 WBC/hpf — ABNORMAL HIGH (ref 0–5)
pH: 5 (ref 5.0–8.0)

## 2022-01-29 LAB — COMPREHENSIVE METABOLIC PANEL
ALT: 34 U/L (ref 0–44)
AST: 20 U/L (ref 15–41)
Albumin: 4.1 g/dL (ref 3.5–5.0)
Alkaline Phosphatase: 51 U/L (ref 38–126)
Anion gap: 8 (ref 5–15)
BUN: 16 mg/dL (ref 8–23)
CO2: 22 mmol/L (ref 22–32)
Calcium: 9.2 mg/dL (ref 8.9–10.3)
Chloride: 110 mmol/L (ref 98–111)
Creatinine, Ser: 1.49 mg/dL — ABNORMAL HIGH (ref 0.61–1.24)
GFR, Estimated: 52 mL/min — ABNORMAL LOW (ref >60)
Glucose, Bld: 146 mg/dL — ABNORMAL HIGH (ref 70–99)
Potassium: 3.7 mmol/L (ref 3.5–5.1)
Sodium: 140 mmol/L (ref 135–145)
Total Bilirubin: 1.3 mg/dL — ABNORMAL HIGH (ref 0.3–1.2)
Total Protein: 7 g/dL (ref 6.5–8.1)

## 2022-01-29 LAB — LIPASE, BLOOD: Lipase: 21 U/L (ref 11–51)

## 2022-01-29 NOTE — ED Triage Notes (Signed)
Pt via POV from home. Pt was sent over for elevated WBC and elevated Cre. States he has been in and out of the hospital for a recurrent since May. Pt c/o urinary frequency, lower abd pain, and groin pain. States he also been having chills and fever. Pt has hx of kidney disease. Pt is A&OX4 and NAD

## 2022-01-29 NOTE — ED Provider Notes (Signed)
[facility] Provider Note    Event Date/Time   First MD Initiated Contact with Patient 01/29/22 2350     (approximate)   History   Abnormal Lab   HPI  Devin White is a 66 y.o. male who presents to the ED for evaluation of Abnormal Lab   I reviewed transplant clinic visit from 7/26.  History of liver transplantation 2009 due to NASH and possible autoimmune hepatitis.  History of recurrent UTIs and medical admission 1 month ago for sepsis due to cystitis.  Immunocompromised with mycophenolate, prednisone 10 and tacrolimus.  Patient reports that he had a 1 week course of Bactrim the end of July due to asymptomatic leukocytosis via his transplant physician.  He presents to the ED for evaluation of 2 days of dysuria, urinary frequency, dribbling urine, fevers and right flank pain.  Nausea without emesis.  No stool changes.  Physical Exam   Triage Vital Signs: ED Triage Vitals [01/29/22 1618]  Enc Vitals Group     BP 134/80     Pulse Rate 81     Resp 20     Temp 97.6 F (36.4 C)     Temp Source Oral     SpO2 94 %     Weight 257 lb (116.6 kg)     Height 6' (1.829 m)     Head Circumference      Peak Flow      Pain Score 8     Pain Loc      Pain Edu?      Excl. in [place]?     Most recent vital signs: Vitals:   01/29/22 1618 01/29/22 2143  BP: 134/80 (!) 135/98  Pulse: 81 92  Resp: 20 18  Temp: 97.6 F (36.4 C) 97.8 F (36.6 C)  SpO2: 94% 96%    General: Awake, no distress.  CV:  Good peripheral perfusion.  Resp:  Normal effort.  Abd:  No distention.  Right-sided CVA tenderness and suprapubic tenderness is present. MSK:  No deformity noted.  Neuro:  No focal deficits appreciated. Other:     ED Results / Procedures / Treatments   Labs (all labs ordered are listed, but only abnormal results are displayed) Labs Reviewed  URINALYSIS, ROUTINE W REFLEX MICROSCOPIC - Abnormal; Notable for the following components:      Result Value    Color, Urine YELLOW (*)    APPearance CLOUDY (*)    Leukocytes,Ua LARGE (*)    WBC, UA >50 (*)    Bacteria, UA RARE (*)    All other components within normal limits  COMPREHENSIVE METABOLIC PANEL - Abnormal; Notable for the following components:   Glucose, Bld 146 (*)    Creatinine, Ser 1.49 (*)    Total Bilirubin 1.3 (*)    GFR, Estimated 52 (*)    All other components within normal limits  CBC WITH DIFFERENTIAL/PLATELET - Abnormal; Notable for the following components:   WBC 21.2 (*)    Neutro Abs 17.6 (*)    Monocytes Absolute 1.5 (*)    Abs Immature Granulocytes 0.13 (*)    All other components within normal limits  URINE CULTURE  CULTURE, BLOOD (ROUTINE X 2)  CULTURE, BLOOD (ROUTINE X 2)  LIPASE, BLOOD  LACTIC ACID, PLASMA  LACTIC ACID, PLASMA  PROTIME-INR  APTT  PROCALCITONIN    EKG   RADIOLOGY   Official radiology report(s): No results found.  PROCEDURES and INTERVENTIONS:  .1-3 Lead EKG Interpretation  Performed by: Vladimir Crofts, MD Authorized by: Vladimir Crofts, MD     Interpretation: normal     ECG rate:  94   ECG rate assessment: normal     Rhythm: sinus rhythm     Ectopy: none     Conduction: normal   .Critical Care  Performed by: Vladimir Crofts, MD Authorized by: Vladimir Crofts, MD   Critical care provider statement:    Critical care time (minutes):  30   Critical care time was exclusive of:  Separately billable procedures and treating other patients   Critical care was necessary to treat or prevent imminent or life-threatening deterioration of the following conditions:  Sepsis   Critical care was time spent personally by me on the following activities:  Development of treatment plan with patient or surrogate, discussions with consultants, evaluation of patient's response to treatment, examination of patient, ordering and review of laboratory studies, ordering and review of radiographic studies, ordering and performing treatments and interventions,  pulse oximetry, re-evaluation of patient's condition and review of old charts   Medications  lactated ringers infusion (has no administration in time range)  lactated ringers bolus 2,000 mL (has no administration in time range)  ceFEPIme (MAXIPIME) 2 g in sodium chloride 0.9 % 100 mL IVPB (has no administration in time range)  morphine (PF) 4 MG/ML injection 4 mg (has no administration in time range)  ondansetron (ZOFRAN) injection 4 mg (has no administration in time range)     IMPRESSION / MDM / [place] / ED COURSE  I reviewed the triage vital signs and the nursing notes.  Differential diagnosis includes, but is not limited to, sepsis, pyelonephritis, ureteral stone, transplant rejection  {Patient presents with symptoms of an acute illness or injury that is potentially life-threatening.  Immunosuppressed 66 year old male presents to the ED with stigmata of sepsis and pyelonephritis.  Borderline elevated heart rates but remains hemodynamically stable without signs of shock.  Leukocytosis is noted.  CKD around baseline.  Normal lipase.  Urine for infectious features will be sent for culture.  We will add on blood cultures and sepsis protocols.  We will use cefepime due to his multiple recent courses of antibiotics as we await a urine culture.  We will consult medicine for admission.  Clinical Course as of 01/30/22 0009  Tue Jan 29, 2022  2357 1 week of Bactrim DS startin 7/26 [DS]    Clinical Course User Index [DS] Vladimir Crofts, MD     FINAL CLINICAL IMPRESSION(S) / ED DIAGNOSES   Final diagnoses:  Sepsis without acute organ dysfunction, due to unspecified organism [facility])  Pyelonephritis     Rx / DC Orders   ED Discharge Orders     None        Note:  This document was prepared using Dragon voice recognition software and may include unintentional dictation errors.   Vladimir Crofts, MD 01/30/22 [phone]

## 2022-01-29 NOTE — ED Provider Notes (Incomplete)
[facility]  Provider Note    Event Date/Time   First MD Initiated Contact with Patient 01/29/22 2350     (approximate)   History   Abnormal Lab   HPI  Devin White is a 66 y.o. male who presents to the ED for evaluation of Abnormal Lab   I reviewed transplant clinic visit from 7/26.  History of liver transplantation 2009 due to NASH and possible autoimmune hepatitis.  History of recurrent UTIs and medical admission 1 month ago for sepsis due to cystitis.  Immunocompromised with mycophenolate, prednisone 10 and tacrolimus.   Physical Exam   Triage Vital Signs: ED Triage Vitals [01/29/22 1618]  Enc Vitals Group     BP 134/80     Pulse Rate 81     Resp 20     Temp 97.6 F (36.4 C)     Temp Source Oral     SpO2 94 %     Weight 257 lb (116.6 kg)     Height 6' (1.829 m)     Head Circumference      Peak Flow      Pain Score 8     Pain Loc      Pain Edu?      Excl. in [place]?     Most recent vital signs: Vitals:   01/29/22 1618 01/29/22 2143  BP: 134/80 (!) 135/98  Pulse: 81 92  Resp: 20 18  Temp: 97.6 F (36.4 C) 97.8 F (36.6 C)  SpO2: 94% 96%    General: Awake, no distress. *** CV:  Good peripheral perfusion.  Resp:  Normal effort.  Abd:  No distention.  MSK:  No deformity noted.  Neuro:  No focal deficits appreciated. Other:     ED Results / Procedures / Treatments   Labs (all labs ordered are listed, but only abnormal results are displayed) Labs Reviewed  URINALYSIS, ROUTINE W REFLEX MICROSCOPIC - Abnormal; Notable for the following components:      Result Value   Color, Urine YELLOW (*)    APPearance CLOUDY (*)    Leukocytes,Ua LARGE (*)    WBC, UA >50 (*)    Bacteria, UA RARE (*)    All other components within normal limits  COMPREHENSIVE METABOLIC PANEL - Abnormal; Notable for the following components:   Glucose, Bld 146 (*)    Creatinine, Ser 1.49 (*)    Total Bilirubin 1.3 (*)    GFR, Estimated 52 (*)    All  other components within normal limits  CBC WITH DIFFERENTIAL/PLATELET - Abnormal; Notable for the following components:   WBC 21.2 (*)    Neutro Abs 17.6 (*)    Monocytes Absolute 1.5 (*)    Abs Immature Granulocytes 0.13 (*)    All other components within normal limits  URINE CULTURE  LIPASE, BLOOD    EKG ***  RADIOLOGY ***  Official radiology report(s): No results found.  PROCEDURES and INTERVENTIONS:  Procedures  Medications - No data to display   IMPRESSION / MDM / [place] / ED COURSE  I reviewed the triage vital signs and the nursing notes.  Differential diagnosis includes, but is not limited to, ***  {Patient presents with symptoms of an acute illness or injury that is potentially life-threatening.}      FINAL CLINICAL IMPRESSION(S) / ED DIAGNOSES   Final diagnoses:  None     Rx / DC Orders   ED Discharge Orders     None  Note:  This document was prepared using Dragon voice recognition software and may include unintentional dictation errors. 

## 2022-01-29 NOTE — ED Provider Triage Note (Signed)
  Emergency Medicine Provider Triage Evaluation Note  RAJA LISKA , a 66 y.o.male,  was evaluated in triage.  Pt complains of abnormal labs/suspected UTI.  Patient states that he has been having dysuria for the past couple months.  He was recently treated with antibiotics for suspected urinary tract infection, however he states that it is not working.  He recently had labs drawn, which showed elevated WBC and elevated creatinine.  He was told to report to the emergency department.   Review of Systems  Positive: Dysuria, abdominal discomfort, nausea Negative: Denies fever, chest pain, vomiting  Physical Exam  There were no vitals filed for this visit. Gen:   Awake, no distress   Resp:  Normal effort  MSK:   Moves extremities without difficulty  Other:    Medical Decision Making  Given the patient's initial medical screening exam, the following diagnostic evaluation has been ordered. The patient will be placed in the appropriate treatment space, once one is available, to complete the evaluation and treatment. I have discussed the plan of care with the patient and I have advised the patient that an ED physician or mid-level practitioner will reevaluate their condition after the test results have been received, as the results may give them additional insight into the type of treatment they may need.    Diagnostics: Labs, UA, urine culture,  Treatments: none immediately   Teodoro Spray, [place] 01/29/22 1547

## 2022-01-30 ENCOUNTER — Inpatient Hospital Stay: Payer: Medicare Other

## 2022-01-30 ENCOUNTER — Emergency Department: Payer: Medicare Other

## 2022-01-30 DIAGNOSIS — N12 Tubulo-interstitial nephritis, not specified as acute or chronic: Secondary | ICD-10-CM | POA: Diagnosis present

## 2022-01-30 DIAGNOSIS — Z87440 Personal history of urinary (tract) infections: Secondary | ICD-10-CM | POA: Diagnosis not present

## 2022-01-30 DIAGNOSIS — N10 Acute tubulo-interstitial nephritis: Secondary | ICD-10-CM | POA: Diagnosis present

## 2022-01-30 DIAGNOSIS — D84821 Immunodeficiency due to drugs: Secondary | ICD-10-CM | POA: Diagnosis present

## 2022-01-30 DIAGNOSIS — Z944 Liver transplant status: Secondary | ICD-10-CM | POA: Diagnosis not present

## 2022-01-30 DIAGNOSIS — A4151 Sepsis due to Escherichia coli [E. coli]: Secondary | ICD-10-CM | POA: Diagnosis present

## 2022-01-30 DIAGNOSIS — N390 Urinary tract infection, site not specified: Secondary | ICD-10-CM | POA: Diagnosis present

## 2022-01-30 DIAGNOSIS — Z6833 Body mass index (BMI) 33.0-33.9, adult: Secondary | ICD-10-CM | POA: Diagnosis not present

## 2022-01-30 DIAGNOSIS — M109 Gout, unspecified: Secondary | ICD-10-CM | POA: Diagnosis present

## 2022-01-30 DIAGNOSIS — G4733 Obstructive sleep apnea (adult) (pediatric): Secondary | ICD-10-CM | POA: Diagnosis present

## 2022-01-30 DIAGNOSIS — N400 Enlarged prostate without lower urinary tract symptoms: Secondary | ICD-10-CM | POA: Diagnosis present

## 2022-01-30 DIAGNOSIS — Z841 Family history of disorders of kidney and ureter: Secondary | ICD-10-CM | POA: Diagnosis not present

## 2022-01-30 DIAGNOSIS — N1831 Chronic kidney disease, stage 3a: Secondary | ICD-10-CM | POA: Diagnosis present

## 2022-01-30 DIAGNOSIS — E876 Hypokalemia: Secondary | ICD-10-CM | POA: Diagnosis present

## 2022-01-30 DIAGNOSIS — Z0389 Encounter for observation for other suspected diseases and conditions ruled out: Secondary | ICD-10-CM | POA: Diagnosis not present

## 2022-01-30 DIAGNOSIS — E669 Obesity, unspecified: Secondary | ICD-10-CM | POA: Diagnosis present

## 2022-01-30 DIAGNOSIS — K219 Gastro-esophageal reflux disease without esophagitis: Secondary | ICD-10-CM | POA: Diagnosis present

## 2022-01-30 DIAGNOSIS — I129 Hypertensive chronic kidney disease with stage 1 through stage 4 chronic kidney disease, or unspecified chronic kidney disease: Secondary | ICD-10-CM | POA: Diagnosis present

## 2022-01-30 DIAGNOSIS — Z7952 Long term (current) use of systemic steroids: Secondary | ICD-10-CM | POA: Diagnosis not present

## 2022-01-30 DIAGNOSIS — Z8673 Personal history of transient ischemic attack (TIA), and cerebral infarction without residual deficits: Secondary | ICD-10-CM | POA: Diagnosis not present

## 2022-01-30 DIAGNOSIS — Z8249 Family history of ischemic heart disease and other diseases of the circulatory system: Secondary | ICD-10-CM | POA: Diagnosis not present

## 2022-01-30 DIAGNOSIS — Z79899 Other long term (current) drug therapy: Secondary | ICD-10-CM | POA: Diagnosis not present

## 2022-01-30 DIAGNOSIS — Z7982 Long term (current) use of aspirin: Secondary | ICD-10-CM | POA: Diagnosis not present

## 2022-01-30 DIAGNOSIS — I517 Cardiomegaly: Secondary | ICD-10-CM | POA: Diagnosis not present

## 2022-01-30 DIAGNOSIS — N200 Calculus of kidney: Secondary | ICD-10-CM | POA: Diagnosis present

## 2022-01-30 LAB — CBC
HCT: 37.4 % — ABNORMAL LOW (ref 39.0–52.0)
Hemoglobin: 12.6 g/dL — ABNORMAL LOW (ref 13.0–17.0)
MCH: 31.7 pg (ref 26.0–34.0)
MCHC: 33.7 g/dL (ref 30.0–36.0)
MCV: 94.2 fL (ref 80.0–100.0)
Platelets: 138 10*3/uL — ABNORMAL LOW (ref 150–400)
RBC: 3.97 MIL/uL — ABNORMAL LOW (ref 4.22–5.81)
RDW: 14.3 % (ref 11.5–15.5)
WBC: 14.7 10*3/uL — ABNORMAL HIGH (ref 4.0–10.5)
nRBC: 0 % (ref 0.0–0.2)

## 2022-01-30 LAB — CREATININE, SERUM
Creatinine, Ser: 1.34 mg/dL — ABNORMAL HIGH (ref 0.61–1.24)
GFR, Estimated: 59 mL/min — ABNORMAL LOW (ref >60)

## 2022-01-30 LAB — PROCALCITONIN: Procalcitonin: 0.24 ng/mL

## 2022-01-30 LAB — PROTIME-INR
INR: 1 (ref 0.8–1.2)
Prothrombin Time: 13.6 seconds (ref 11.4–15.2)

## 2022-01-30 LAB — APTT: aPTT: 30 seconds (ref 24–36)

## 2022-01-30 LAB — LACTIC ACID, PLASMA
Lactic Acid, Venous: 1.5 mmol/L (ref 0.5–1.9)
Lactic Acid, Venous: 1.5 mmol/L (ref 0.5–1.9)

## 2022-01-30 LAB — TSH: TSH: 3.504 u[IU]/mL (ref 0.350–4.500)

## 2022-01-30 LAB — CBG MONITORING, ED: Glucose-Capillary: 118 mg/dL — ABNORMAL HIGH (ref 70–99)

## 2022-01-30 LAB — HEMOGLOBIN A1C
Hgb A1c MFr Bld: 5.1 % (ref 4.8–5.6)
Mean Plasma Glucose: 99.67 mg/dL

## 2022-01-30 MED ORDER — CALCIUM CARBONATE ANTACID 500 MG PO CHEW: 1.0000 | CHEWABLE_TABLET | Freq: Two times a day (BID) | ORAL | Status: DC | PRN

## 2022-01-30 MED ORDER — MORPHINE SULFATE (PF) 4 MG/ML IV SOLN: 4.0000 mg | Freq: Once | INTRAVENOUS | Status: AC

## 2022-01-30 MED ORDER — ONDANSETRON HCL 4 MG PO TABS: 4.0000 mg | ORAL_TABLET | Freq: Four times a day (QID) | ORAL | Status: DC | PRN

## 2022-01-30 MED ORDER — MORPHINE SULFATE (PF) 4 MG/ML IV SOLN
INTRAVENOUS | Status: AC
  Administered 2022-01-30: 4 mg via INTRAVENOUS
  Filled 2022-01-30: qty 1

## 2022-01-30 MED ORDER — ACETAMINOPHEN 325 MG PO TABS
650.0000 mg | ORAL_TABLET | Freq: Four times a day (QID) | ORAL | Status: DC | PRN
  Administered 2022-01-30 – 2022-02-01 (×3): 650 mg via ORAL
  Filled 2022-01-30 (×3): qty 2

## 2022-01-30 MED ORDER — AMLODIPINE BESYLATE 5 MG PO TABS
2.5000 mg | ORAL_TABLET | Freq: Every day | ORAL | Status: DC
  Administered 2022-01-30 – 2022-02-02 (×4): 2.5 mg via ORAL
  Filled 2022-01-30 (×4): qty 1

## 2022-01-30 MED ORDER — HEPARIN SODIUM (PORCINE) 5000 UNIT/ML IJ SOLN
5000.0000 [IU] | Freq: Three times a day (TID) | INTRAMUSCULAR | Status: DC
  Administered 2022-01-30 – 2022-02-02 (×10): 5000 [IU] via SUBCUTANEOUS
  Filled 2022-01-30 (×10): qty 1

## 2022-01-30 MED ORDER — TAMSULOSIN HCL 0.4 MG PO CAPS
0.4000 mg | ORAL_CAPSULE | Freq: Every day | ORAL | Status: DC
  Administered 2022-01-30 – 2022-02-02 (×4): 0.4 mg via ORAL
  Filled 2022-01-30 (×4): qty 1

## 2022-01-30 MED ORDER — LACTATED RINGERS IV BOLUS (SEPSIS)
2000.0000 mL | Freq: Once | INTRAVENOUS | Status: AC
  Administered 2022-01-30: 2000 mL via INTRAVENOUS

## 2022-01-30 MED ORDER — ROSUVASTATIN CALCIUM 10 MG PO TABS
5.0000 mg | ORAL_TABLET | Freq: Every evening | ORAL | Status: DC
  Administered 2022-01-30 – 2022-02-01 (×3): 5 mg via ORAL
  Filled 2022-01-30 (×4): qty 1

## 2022-01-30 MED ORDER — SODIUM CHLORIDE 0.9 % IV SOLN
2.0000 g | Freq: Three times a day (TID) | INTRAVENOUS | Status: DC
  Administered 2022-01-30 – 2022-02-01 (×7): 2 g via INTRAVENOUS
  Filled 2022-01-30: qty 2
  Filled 2022-01-30 (×7): qty 12.5

## 2022-01-30 MED ORDER — MYCOPHENOLATE MOFETIL 250 MG PO CAPS
500.0000 mg | ORAL_CAPSULE | Freq: Every morning | ORAL | Status: DC
  Administered 2022-01-30 – 2022-02-02 (×4): 500 mg via ORAL
  Filled 2022-01-30 (×4): qty 2

## 2022-01-30 MED ORDER — ONDANSETRON HCL 4 MG/2ML IJ SOLN: 4.0000 mg | Freq: Four times a day (QID) | INTRAMUSCULAR | Status: DC | PRN

## 2022-01-30 MED ORDER — LACTATED RINGERS IV SOLN: INTRAVENOUS | Status: DC

## 2022-01-30 MED ORDER — TOPIRAMATE 25 MG PO TABS
50.0000 mg | ORAL_TABLET | Freq: Two times a day (BID) | ORAL | Status: DC
  Administered 2022-01-30 – 2022-02-02 (×7): 50 mg via ORAL
  Filled 2022-01-30 (×8): qty 2

## 2022-01-30 MED ORDER — PANTOPRAZOLE SODIUM 40 MG PO TBEC
40.0000 mg | DELAYED_RELEASE_TABLET | Freq: Every day | ORAL | Status: DC
  Administered 2022-01-30 – 2022-02-02 (×4): 40 mg via ORAL
  Filled 2022-01-30 (×4): qty 1

## 2022-01-30 MED ORDER — SODIUM CHLORIDE 0.9 % IV SOLN
INTRAVENOUS | Status: AC
  Administered 2022-01-30: 2 g via INTRAVENOUS
  Filled 2022-01-30: qty 12.5

## 2022-01-30 MED ORDER — LACTATED RINGERS IV SOLN: INTRAVENOUS | Status: AC

## 2022-01-30 MED ORDER — ONDANSETRON HCL 4 MG/2ML IJ SOLN
INTRAMUSCULAR | Status: AC
  Administered 2022-01-30: 4 mg via INTRAVENOUS
  Filled 2022-01-30: qty 2

## 2022-01-30 MED ORDER — ALBUTEROL SULFATE (2.5 MG/3ML) 0.083% IN NEBU
2.5000 mg | INHALATION_SOLUTION | RESPIRATORY_TRACT | Status: DC | PRN
Start: 2022-01-30 — End: 2022-02-02

## 2022-01-30 MED ORDER — ACETAMINOPHEN 650 MG RE SUPP: 650.0000 mg | Freq: Four times a day (QID) | RECTAL | Status: DC | PRN

## 2022-01-30 MED ORDER — COLCHICINE 0.6 MG PO TABS: 0.6000 mg | ORAL_TABLET | Freq: Every day | ORAL | Status: DC | PRN

## 2022-01-30 MED ORDER — TACROLIMUS 1 MG PO CAPS
1.0000 mg | ORAL_CAPSULE | Freq: Every day | ORAL | Status: DC
  Administered 2022-01-30 – 2022-02-01 (×4): 1 mg via ORAL
  Filled 2022-01-30 (×5): qty 1

## 2022-01-30 MED ORDER — PREDNISONE 10 MG PO TABS
10.0000 mg | ORAL_TABLET | Freq: Every day | ORAL | Status: DC
  Administered 2022-01-30 – 2022-02-02 (×4): 10 mg via ORAL
  Filled 2022-01-30 (×4): qty 1

## 2022-01-30 MED ORDER — ONDANSETRON HCL 4 MG/2ML IJ SOLN: 4.0000 mg | Freq: Once | INTRAMUSCULAR | Status: AC

## 2022-01-30 MED ORDER — MORPHINE SULFATE (PF) 2 MG/ML IV SOLN
1.0000 mg | INTRAVENOUS | Status: DC | PRN
  Administered 2022-01-30 – 2022-02-01 (×5): 1 mg via INTRAVENOUS
  Filled 2022-01-30 (×5): qty 1

## 2022-01-30 MED ORDER — MYCOPHENOLATE MOFETIL 250 MG PO CAPS
250.0000 mg | ORAL_CAPSULE | Freq: Every day | ORAL | Status: DC
  Administered 2022-01-30 – 2022-02-01 (×4): 250 mg via ORAL
  Filled 2022-01-30 (×5): qty 1

## 2022-01-30 MED ORDER — ASPIRIN 325 MG PO TABS
325.0000 mg | ORAL_TABLET | Freq: Every day | ORAL | Status: DC
  Administered 2022-01-30 – 2022-02-02 (×4): 325 mg via ORAL
  Filled 2022-01-30 (×4): qty 1

## 2022-01-30 MED ORDER — TACROLIMUS 0.5 MG PO CAPS
0.5000 mg | ORAL_CAPSULE | Freq: Every morning | ORAL | Status: DC
  Administered 2022-01-30 – 2022-02-02 (×4): 0.5 mg via ORAL
  Filled 2022-01-30 (×5): qty 1

## 2022-01-30 MED ORDER — SODIUM CHLORIDE 0.9 % IV SOLN: 2.0000 g | Freq: Once | INTRAVENOUS | Status: AC

## 2022-01-30 NOTE — H&P (Signed)
History and Physical    Devin White EXH:[id number] DOB: 1955/11/18 DOA: 01/29/2022  PCP: Venia Carbon, MD  Patient coming from: home  I have personally briefly reviewed patient's old medical records in [place]  Chief Complaint: elevated wbc with assc urinary frequency, flank pain,chills and fever  HPI: Devin White is a 66 y.o. male with medical history significant of  liver failure  due to NASH s/p liver transplant 2009,  patient is maintained on  mycophenolate, prednisone and tacrolimus. Patient also has history of HTN, GERD, GOUT, BPH renal stones, obesity, mesenteric thrombosis, stage IIIa CKD, sleep apnea ,CVA 5/23, recurrent UTIs who presents with 48-72 hours of fever/chills/ urinary frequency associated with flank pain as well as leukocytosis. Of note patient has interim history of  admission 7/11-7/17 for uti with severe sepsis, of note patient also was recently place on bactrim prescribed on 7/31 due to isolated leukocytosis with concern for recurrent uti. Patient is of note s/p course of abx currently.  Patient currently states continues to have chills, lower abdominal pain, and flank pain as well as nausea.   Notes no sob or chest pain .   ED Course:  Afeb bp 134/80, hr 81 rr 20 sat 94% on ra  Labs: UA+LE, rare bacteria, wbc>50 Wbc; 21.2, hgb 14.3, plt 175,  NA 140, K 3.7, gly 145, cr 1.49 ( 1.230) Liapse 21 Cxr NAD  Review of Systems: As per HPI otherwise 10 point review of systems negative.   Past Medical History:  Diagnosis Date   Arthritis    back and legs   Benign prostatic hypertrophy    GERD (gastroesophageal reflux disease)    Gout    History of blood transfusion    pt has antibodies in his blood since previous transfusions   History of cirrhosis of liver S/P TRANSPLANT 2009   History of liver failure S/P TRANSPLANT   Hypertension    Left ureteral calculus     Past Surgical History:  Procedure Laterality Date   APPENDECTOMY     bone  morrow biopsy     CHOLECYSTECTOMY  2007   CYSTO/ LEFT RETROGRADE PYELOGRAM/ LEFT URETERAL STENT PLACEMENT  03-05-2012  DR Tresa Moore [facility])   LEFT URETERAL CALCULI   CYSTOSCOPY W/ URETERAL STENT PLACEMENT  03/11/2012   Procedure: CYSTOSCOPY WITH STENT REPLACEMENT;  Surgeon: Alexis Frock, MD;  Location: [facility];  Service: Urology;  Laterality: Left;   HERNIA REPAIR  2008   LIVER BIOPSY     LIVER TRANSPLANT  11/24/2007   pt states doing well since liver transplant   LUMBAR DISC SURGERY     LUMBAR FUSION     removal of fistula     URETEROSCOPY  03/11/2012   Procedure: URETEROSCOPY;  Surgeon: Alexis Frock, MD;  Location: [facility];  Service: Urology;  Laterality: Left;   STONE MANIPULATION, stone obtained  [place] [facility]     reports that he has never smoked. He has been exposed to tobacco smoke. He has never used smokeless tobacco. He reports that he does not drink alcohol and does not use drugs.  No Known Allergies  Family History  Problem Relation Age of Onset   Cancer Mother        colon   Arthritis Mother    Vasculitis Mother    Hypertension Mother    Cancer Father        colon   Kidney disease Father    Arthritis Father  Arthritis Brother    Alcohol abuse Maternal Aunt    Diabetes Maternal Aunt    Alcohol abuse Maternal Uncle    Diabetes Paternal Aunt    Alcohol abuse Maternal Uncle    Alcohol abuse Maternal Uncle     Prior to Admission medications   Medication Sig Start Date End Date Taking? Authorizing Provider  amLODipine (NORVASC) 5 MG tablet Take 1/2 tablet (2.5 mg total) by mouth daily. 12/31/21  Yes Georgette Shell, MD  ascorbic acid (VITAMIN C) 500 MG tablet Take 1 tablet (500 mg total) by mouth daily. 11/07/21  Yes Hosie Poisson, MD  aspirin 325 MG tablet Take 1 tablet (325 mg total) by mouth daily. 11/07/21  Yes Hosie Poisson, MD  colchicine 0.6 MG tablet Take 1 tablet (0.6 mg total) by mouth daily as needed. Patient  taking differently: Take 0.6 mg by mouth every other day. 11/06/21  Yes Hosie Poisson, MD  furosemide (LASIX) 40 MG tablet Take 1 tablet (40 mg total) by mouth daily. 10/12/21  Yes Viviana Simpler I, MD  lisinopril (ZESTRIL) 5 MG tablet Take 1/2 tablet (2.5 mg total) by mouth daily. 12/31/21  Yes Georgette Shell, MD  Multiple Vitamin (MULTIVITAMIN WITH MINERALS) TABS tablet Take 1 tablet by mouth daily. 11/07/21  Yes Hosie Poisson, MD  mycophenolate (CELLCEPT) 250 MG capsule Take 250-500 mg by mouth 2 (two) times daily. 2 capsules (500 mg) in the morning and one capsule (250 mg) in the evening 02/04/19  Yes [provider]  omeprazole (PRILOSEC) 20 MG capsule TAKE 1 CAPSULE BY MOUTH TWICE DAILY Patient taking differently: daily. 02/26/21  Yes Venia Carbon, MD  predniSONE (DELTASONE) 10 MG tablet Take 10 mg by mouth daily with breakfast.   Yes [provider]  rosuvastatin (CRESTOR) 5 MG tablet Take 1 tablet (5 mg total) by mouth every evening. 11/06/21  Yes Hosie Poisson, MD  senna (SENOKOT) 8.6 MG TABS tablet Take 2 tablets (17.2 mg total) by mouth daily. 01/01/22  Yes Georgette Shell, MD  tacrolimus (PROGRAF) 0.5 MG capsule Take 0.5-1 mg by mouth See admin instructions. Taking 0.5 mg in the morning and 1 mg at bedtime   Yes [provider]  tamsulosin (FLOMAX) 0.4 MG CAPS capsule TAKE 1 CAPSULE(0.4 MG) BY MOUTH DAILY Patient taking differently: Take 0.4 mg by mouth daily. 12/17/21  Yes Venia Carbon, MD  topiramate (TOPAMAX) 50 MG tablet Take 1 tablet (50 mg total) by mouth 2 (two) times daily. 11/06/21  Yes Hosie Poisson, MD  vitamin A 3 MG (10000 UNITS) capsule Take 1 capsule (10,000 Units total) by mouth daily. 11/07/21  Yes Hosie Poisson, MD  zinc sulfate 220 (50 Zn) MG capsule Take 1 capsule (220 mg total) by mouth daily. 11/07/21  Yes Hosie Poisson, MD  acetaminophen (TYLENOL) 325 MG tablet Take 650 mg by mouth every 6 (six) hours as needed for mild pain.     [provider]  calcium carbonate (TUMS) 500 MG chewable tablet Chew 1 tablet (200 mg of elemental calcium total) by mouth 2 (two) times daily as needed for indigestion or heartburn. 06/26/16   Tonia Ghent, MD  diclofenac Sodium (VOLTAREN) 1 % GEL Apply 4 g topically 4 (four) times daily. Patient taking differently: Apply 4 g topically 2 (two) times daily as needed (pain). 05/25/21 05/25/22  Venia Carbon, MD  ondansetron (ZOFRAN) 4 MG tablet Take 1 tablet (4 mg total) by mouth every 6 (six) hours as needed for  nausea. 12/31/21   Georgette Shell, MD  SUMAtriptan (IMITREX) 100 MG tablet Take 1 tablet (100 mg total) by mouth once as needed for migraine (may repeat x 1 in 24 hrs not more than 2 days per week. total 9 tablets). May repeat in 2 hours if headache persists or recurs. 11/06/21 11/07/22  Hosie Poisson, MD    Physical Exam: Vitals:   01/29/22 1618 01/29/22 2143  BP: 134/80 (!) 135/98  Pulse: 81 92  Resp: 20 18  Temp: 97.6 F (36.4 C) 97.8 F (36.6 C)  TempSrc: Oral Oral  SpO2: 94% 96%  Weight: 116.6 kg   Height: 6' (1.829 m)     Vitals:   01/29/22 1618 01/29/22 2143  BP: 134/80 (!) 135/98  Pulse: 81 92  Resp: 20 18  Temp: 97.6 F (36.4 C) 97.8 F (36.6 C)  TempSrc: Oral Oral  SpO2: 94% 96%  Weight: 116.6 kg   Height: 6' (1.829 m)    Constitutional: NAD, calm, comfortable Eyes: PERRL, lids and conjunctivae normal ENMT: Mucous membranes are moist. Posterior pharynx clear of any exudate or lesions.Normal dentition.  Neck: normal, supple, no masses, no thyromegaly Respiratory: clear to auscultation bilaterally, no wheezing, no crackles. Normal respiratory effort. No accessory muscle use.  Cardiovascular: Regular rate and rhythm, no murmurs / rubs / gallops. Trace extremity edema. 2+ pedal pulses.   Abdomen: mild suprapubic tenderness, no masses palpated. No hepatosplenomegaly. Bowel sounds positive.  Musculoskeletal: no clubbing / cyanosis. No joint  deformity upper and lower extremities. Good ROM, no contractures. Normal muscle tone.  Skin: no rashes, lesions, ulcers. No induration Neurologic: CN 2-12 grossly intact. Sensation intact,Strength 5/5 in all 4.  Psychiatric: Normal judgment and insight. Alert and oriented x 3. Normal mood.    Labs on Admission: I have personally reviewed following labs and imaging studies  CBC: Recent Labs  Lab 01/29/22 1641  WBC 21.2*  NEUTROABS 17.6*  HGB 14.3  HCT 43.0  MCV 95.8  PLT 188   Basic Metabolic Panel: Recent Labs  Lab 01/29/22 1641  NA 140  K 3.7  CL 110  CO2 22  GLUCOSE 146*  BUN 16  CREATININE 1.49*  CALCIUM 9.2   GFR: Estimated Creatinine Clearance: 66 mL/min (A) (by C-G formula based on SCr of 1.49 mg/dL (H)). Liver Function Tests: Recent Labs  Lab 01/29/22 1641  AST 20  ALT 34  ALKPHOS 51  BILITOT 1.3*  PROT 7.0  ALBUMIN 4.1   Recent Labs  Lab 01/29/22 1641  LIPASE 21   No results for input(s): "AMMONIA" in the last 168 hours. Coagulation Profile: No results for input(s): "INR", "PROTIME" in the last 168 hours. Cardiac Enzymes: No results for input(s): "CKTOTAL", "CKMB", "CKMBINDEX", "TROPONINI" in the last 168 hours. BNP (last 3 results) No results for input(s): "PROBNP" in the last 8760 hours. HbA1C: No results for input(s): "HGBA1C" in the last 72 hours. CBG: No results for input(s): "GLUCAP" in the last 168 hours. Lipid Profile: No results for input(s): "CHOL", "HDL", "LDLCALC", "TRIG", "CHOLHDL", "LDLDIRECT" in the last 72 hours. Thyroid Function Tests: No results for input(s): "TSH", "T4TOTAL", "FREET4", "T3FREE", "THYROIDAB" in the last 72 hours. Anemia Panel: No results for input(s): "VITAMINB12", "FOLATE", "FERRITIN", "TIBC", "IRON", "RETICCTPCT" in the last 72 hours. Urine analysis:    Component Value Date/Time   COLORURINE YELLOW (A) 01/29/2022 1620   APPEARANCEUR CLOUDY (A) 01/29/2022 1620   APPEARANCEUR Clear 11/30/2013 1959    LABSPEC 1.013 01/29/2022 1620   LABSPEC 1.017  11/30/2013 1959   PHURINE 5.0 01/29/2022 1620   GLUCOSEU NEGATIVE 01/29/2022 1620   GLUCOSEU Negative 11/30/2013 1959   HGBUR NEGATIVE 01/29/2022 1620   HGBUR negative 05/25/2010 1153   BILIRUBINUR NEGATIVE 01/29/2022 1620   BILIRUBINUR Negative 11/30/2013 1959   KETONESUR NEGATIVE 01/29/2022 1620   PROTEINUR NEGATIVE 01/29/2022 1620   UROBILINOGEN 0.2 08/25/2012 2256   NITRITE NEGATIVE 01/29/2022 1620   LEUKOCYTESUR LARGE (A) 01/29/2022 1620   LEUKOCYTESUR Negative 11/30/2013 1959    Radiological Exams on Admission: DG Chest Port 1 View  Result Date: 01/30/2022 CLINICAL DATA:  Questionable sepsis, evaluate for abnormality. EXAM: PORTABLE CHEST 1 VIEW COMPARISON:  Radiographs 12/26/2021 FINDINGS: Borderline cardiomegaly. No focal consolidation, pleural effusion or pneumothorax. No acute osseous abnormality. Surgical clips near the GE junction. IMPRESSION: No active disease. Electronically Signed   By: Placido Sou M.D.   On: 01/30/2022 00:16    EKG: Independently reviewed.  Snr ,no st twave changes  Assessment/Plan UTI/Pyelonephritis  Sepsis -in setting of chronic immunosuppressive therapy  -admit to progressive care  - start on broad spectrum abx with cefepime due to hx of recurrent uti , know renal stones which may be nidus for infection and recent antibiotic course  - consider ID consult in am if patient does not have improvement  -patient may also need urology consult out patient re renal stones in setting recurrent UTI  -supportive care with ivfs, antiemetics , pain medications -renal u/s r/o abscess   Liver failure  due to NASH - s/p liver transplant 2009   -patient is maintained on  mycophenolate, prednisone and tacrolimus. -prn lasix    HTN -continue amlodipine,lisinopril   GERD -ppi   GOUT -colchicine daily    BPH  -tamsulosin    Obesity  Mesenteric thrombosis   CKDIIIa -cr close to baseline     OSA -cpap as able    Resume meds as stabled above one med reconciliation has been completed   DVT prophylaxis:  Heparin  Code Status: Full Family Communication:  Disposition Plan: patient  expected to be admitted greater than 2 midnights  Consults called: n/a consider outpatient urology, consider ID if patient does not improve Admission status: progressive care    Clance Boll MD [facility]   If 7PM-7AM, please contact night-coverage [email or web address] Password [facility]  01/30/2022, 12:58 AM

## 2022-01-30 NOTE — ED Notes (Signed)
Pt placed on 2L oxygen due to continuous drop in oxygen saturation while sleeping. Readings while sleeping range 66-78% room air.

## 2022-01-30 NOTE — Progress Notes (Signed)
CODE SEPSIS - PHARMACY COMMUNICATION  **Broad Spectrum Antibiotics should be administered within 1 hour of Sepsis diagnosis**  Time Code Sepsis Called/Page Received: 8/16 @ 0001  Antibiotics Ordered: Cefepime 2 gm  Time of 1st antibiotic administration: Cefepime 2 gm IV X 1 on 8/16 @ 0056  Additional action taken by pharmacy:   If necessary, Name of Provider/Nurse Contacted:     Thorvald Orsino D ,PharmD Clinical Pharmacist  01/30/2022  2:13 AM

## 2022-01-30 NOTE — ED Notes (Signed)
Patient resting in bed free from sign of distress. Breathing unlabored speaking in full sentences with symmetric chest rise and fall. Bed low and locked with side rails raised x2. Call bell in reach and monitor in place.   

## 2022-01-30 NOTE — ED Notes (Signed)
Informed RN bed assigned 

## 2022-01-30 NOTE — Progress Notes (Signed)
Pt states he does not wear CPAP at home and does not wish to wear here. CPAP will be made available if pt changes his mind.

## 2022-01-30 NOTE — Sepsis Progress Note (Signed)
Following for sepsis monitoring ?

## 2022-01-30 NOTE — Progress Notes (Signed)
Pt seen and examined. H/p reviewed Devin White is a 66 y.o. male with medical history significant of  liver failure  due to NASH s/p liver transplant 2009,  patient is maintained on  mycophenolate, prednisone and tacrolimus. Patient also has history of HTN, GERD, GOUT, BPH renal stones, obesity, mesenteric thrombosis, stage IIIa CKD, sleep apnea ,CVA 5/23, recurrent UTIs who presents with 48-72 hours of fever/chills/ urinary frequency associated with flank pain as well as leukocytosis. Of note patient has interim history of  admission 7/11-7/17 for uti with severe sepsis, of note patient also was recently place on bactrim prescribed on 7/31 due to isolated leukocytosis with concern for recurrent uti. Patient is of note s/p course of abx currently.  Patient currently states continues to have chills, lower abdominal pain, and flank pain as well as nausea.   Notes no sob or chest pain .    Still with lower abd pain.   Cta no w/r Reg s1/s2  Soft mild ttp lower quad./suprapubic area   A/P Continue ivf Continue ivabx Ck post void bladder scan

## 2022-01-31 DIAGNOSIS — N10 Acute tubulo-interstitial nephritis: Secondary | ICD-10-CM

## 2022-01-31 DIAGNOSIS — N390 Urinary tract infection, site not specified: Secondary | ICD-10-CM | POA: Diagnosis not present

## 2022-01-31 DIAGNOSIS — A419 Sepsis, unspecified organism: Secondary | ICD-10-CM

## 2022-01-31 LAB — GLUCOSE, CAPILLARY
Glucose-Capillary: 97 mg/dL (ref 70–99)
Glucose-Capillary: 147 mg/dL — ABNORMAL HIGH (ref 70–99)
Glucose-Capillary: 167 mg/dL — ABNORMAL HIGH (ref 70–99)

## 2022-01-31 LAB — PROCALCITONIN: Procalcitonin: 0.15 ng/mL

## 2022-01-31 NOTE — Progress Notes (Addendum)
PROGRESS NOTE    Devin White  LNL:[id number] DOB: [birth year] DOA: 01/29/2022 PCP: Venia Carbon, MD    Brief Narrative:  Devin White is a 66 y.o. male with medical history significant of  liver failure  due to NASH s/p liver transplant 2009,  patient is maintained on  mycophenolate, prednisone and tacrolimus. Patient also has history of HTN, GERD, GOUT, BPH renal stones, obesity, mesenteric thrombosis, stage IIIa CKD, sleep apnea ,CVA 5/23, recurrent UTIs who presents with 48-72 hours of fever/chills/ urinary frequency associated with flank pain as well as leukocytosis. Of note patient has interim history of  admission 7/11-7/17 for uti with severe sepsis, of note patient also was recently place on bactrim prescribed on 7/31 due to isolated leukocytosis with concern for recurrent uti. Patient is of note s/p course of abx currently.  Patient currently states continues to have chills, lower abdominal pain, and flank pain as well as nausea  8/17 still with suprapubic pain.  Bladder scan postvoid was 34 mL.  Overall improving slowly  Consultants:    Procedures:   Antimicrobials:  Cefepime*    Subjective: No nausea or vomiting, or shortness of breath  Objective: Vitals:   01/30/22 1715 01/31/22 0341 01/31/22 0738 01/31/22 1200  BP:  (!) 134/97 (!) 151/83 129/82  Pulse:  65 65 68  Resp:  '16 18 16  '$ Temp:  97.6 F (36.4 C) 97.8 F (36.6 C) 98.1 F (36.7 C)  TempSrc: Oral     SpO2:  95% 96% 94%  Weight:  116.5 kg    Height:        Intake/Output Summary (Last 24 hours) at 01/31/2022 1527 Last data filed at 01/31/2022 1400 Gross per 24 hour  Intake 660 ml  Output 2909 ml  Net -2249 ml   Filed Weights   01/29/22 1618 01/31/22 0341  Weight: 116.6 kg 116.5 kg    Examination: Calm, NAD Cta no w/r Reg s1/s2 no gallop Soft benign +bs. Mild ttp suprapubic area No edema Aaoxox3  Mood and affect appropriate in current setting     Data Reviewed: I have  personally reviewed following labs and imaging studies  CBC: Recent Labs  Lab 01/29/22 1641 01/30/22 1218  WBC 21.2* 14.7*  NEUTROABS 17.6*  --   HGB 14.3 12.6*  HCT 43.0 37.4*  MCV 95.8 94.2  PLT 175 408*   Basic Metabolic Panel: Recent Labs  Lab 01/29/22 1641 01/30/22 1218  NA 140  --   K 3.7  --   CL 110  --   CO2 22  --   GLUCOSE 146*  --   BUN 16  --   CREATININE 1.49* 1.34*  CALCIUM 9.2  --    GFR: Estimated Creatinine Clearance: 73.4 mL/min (A) (by C-G formula based on SCr of 1.34 mg/dL (H)). Liver Function Tests: Recent Labs  Lab 01/29/22 1641  AST 20  ALT 34  ALKPHOS 51  BILITOT 1.3*  PROT 7.0  ALBUMIN 4.1   Recent Labs  Lab 01/29/22 1641  LIPASE 21   No results for input(s): "AMMONIA" in the last 168 hours. Coagulation Profile: Recent Labs  Lab 01/30/22 0049  INR 1.0   Cardiac Enzymes: No results for input(s): "CKTOTAL", "CKMB", "CKMBINDEX", "TROPONINI" in the last 168 hours. BNP (last 3 results) No results for input(s): "PROBNP" in the last 8760 hours. HbA1C: Recent Labs    01/30/22 0049  HGBA1C 5.1   CBG: Recent Labs  Lab 01/30/22 0752 01/31/22 0735 01/31/22  1200  GLUCAP 118* 97 147*   Lipid Profile: No results for input(s): "CHOL", "HDL", "LDLCALC", "TRIG", "CHOLHDL", "LDLDIRECT" in the last 72 hours. Thyroid Function Tests: Recent Labs    01/30/22 0049  TSH 3.504   Anemia Panel: No results for input(s): "VITAMINB12", "FOLATE", "FERRITIN", "TIBC", "IRON", "RETICCTPCT" in the last 72 hours. Sepsis Labs: Recent Labs  Lab 01/30/22 0049 01/30/22 0214 01/31/22 0427  PROCALCITON 0.24  --  0.15  LATICACIDVEN 1.5 1.5  --     Recent Results (from the past 240 hour(s))  Urine Culture     Status: Abnormal (Preliminary result)   Collection Time: 01/29/22  4:20 PM   Specimen: Urine, Random  Result Value Ref Range Status   Specimen Description   Final    URINE, RANDOM Performed at [facility], [address], [place], [place] [address]    Special Requests   Final    NONE Performed at [facility], [address] [place], [place] [address]    Culture (A)  Final    40,000 COLONIES/mL ESCHERICHIA COLI SUSCEPTIBILITIES TO FOLLOW Performed at [facility], [place] [address] [place], [place] [address]    Report Status PENDING  Incomplete  Blood Culture (routine x 2)     Status: None (Preliminary result)   Collection Time: 01/30/22 12:49 AM   Specimen: BLOOD  Result Value Ref Range Status   Specimen Description BLOOD RIGHT ASSIST CONTROL  Final   Special Requests   Final    BOTTLES DRAWN AEROBIC AND ANAEROBIC Blood Culture results may not be optimal due to an inadequate volume of blood received in culture bottles   Culture   Final    NO GROWTH 1 DAY Performed at [facility], [address], [place], [place] [address]    Report Status PENDING  Incomplete  Blood Culture (routine x 2)     Status: None (Preliminary result)   Collection Time: 01/30/22 12:49 AM   Specimen: BLOOD  Result Value Ref Range Status   Specimen Description BLOOD LEFT ASSIST CONTROL  Final   Special Requests   Final    BOTTLES DRAWN AEROBIC AND ANAEROBIC Blood Culture adequate volume   Culture   Final    NO GROWTH 1 DAY Performed at [facility], [address], [place],  [address]    Report Status PENDING  Incomplete         Radiology Studies: US RENAL  Result Date: 01/30/2022 CLINICAL DATA:  66 year old male with UTI. EXAM: RENAL / URINARY TRACT ULTRASOUND COMPLETE COMPARISON:  CT Abdomen and Pelvis 12/26/2021. FINDINGS: Right Kidney: Renal measurements: 11.9 x 6.2 x 5.4 cm = volume: 209 mL. Cortical thinning as demonstrated by CT last month. No hydronephrosis. No solid right renal mass. 2 small upper pole simple appearing cysts (the larger 2.8 cm image 19, follow-up imaging recommended). Left Kidney: Renal measurements: 13.1 x 6.7 x 5.3 cm = volume: 245 mL. Cortical  thinning as demonstrated by CT last month. No hydronephrosis. No solid mass. Upper pole simple appearing cyst 3.5 cm (image 37), and smaller 1.5 cm lower pole cyst with a single thin septation but no convincing vascular elements (image 46, no follow-up imaging recommended). Bladder: Appears normal for degree of bladder distention. Other: None. IMPRESSION: 1. No acute renal findings. 2. Bilateral renal cortical thinning as seen by CT last month. Electronically Signed   By: Genevie Ann M.D.   On: 01/30/2022 03:59   DG Chest Port 1  View  Result Date: 01/30/2022 CLINICAL DATA:  Questionable sepsis, evaluate for abnormality. EXAM: PORTABLE CHEST 1 VIEW COMPARISON:  Radiographs 12/26/2021 FINDINGS: Borderline cardiomegaly. No focal consolidation, pleural effusion or pneumothorax. No acute osseous abnormality. Surgical clips near the GE junction. IMPRESSION: No active disease. Electronically Signed   By: Placido Sou M.D.   On: 01/30/2022 00:16        Scheduled Meds:  amLODipine  2.5 mg Oral Daily   aspirin  325 mg Oral Daily   heparin  5,000 Units Subcutaneous Q8H   mycophenolate  250 mg Oral QHS   mycophenolate  500 mg Oral q morning   pantoprazole  40 mg Oral Daily   predniSONE  10 mg Oral Q breakfast   rosuvastatin  5 mg Oral QPM   tacrolimus  0.5 mg Oral q morning   tacrolimus  1 mg Oral QHS   tamsulosin  0.4 mg Oral Daily   topiramate  50 mg Oral BID   Continuous Infusions:  ceFEPime (MAXIPIME) IV 2 g (01/31/22 0911)    Assessment & Plan:   Principal Problem:   Complicated UTI (urinary tract infection) Active Problems:   GERD   Hypertension   Liver transplant status ([place])   Acute pyelonephritis   Sepsis ([place])  UTI/Pyelonephritis  Sepsis -in setting of chronic immunosuppressive therapy  -admit to progressive care  - start on broad spectrum abx with cefepime due to hx of recurrent uti , know renal stones which may be nidus for infection and recent antibiotic course  -patient  may also need urology consult out patient re renal stones in setting recurrent UTI  -supportive care with ivfs, antiemetics , pain medications 8/17 renal ultrasound without any abscess Procalcitonin decreasing Continue IV antibiotics    Liver failure  due to NASH - s/p liver transplant 2009   -patient is maintained on  mycophenolate, prednisone and tacrolimus. 8/17 IV Lasix as needed Continue immunosuppressant    HTN Stable continue current regimen     GERD Continue PPI    GOUT No acute flares    BPH Continue tamsulosin    CKDIIIa -cr close to baseline     OSA -cpap as able      DVT prophylaxis: Heparin subcu Code Status: Full Family Communication: None bedside Disposition Plan: Home Status is: Inpatient Remains inpatient appropriate because: IV treatment.  PT consult        LOS: 1 day   Time spent: 35 minutes    Nolberto Hanlon, MD [facility] Pager [phone]  If 7PM-7AM, please contact night-coverage 01/31/2022, 3:27 PM

## 2022-01-31 NOTE — Progress Notes (Signed)
Pharmacy Antibiotic Note  Devin White is a 65 y.o. male admitted on 01/29/2022 with sepsis and pyelonephritis . PMH is NASH s/p liver transplant 2009, HTN, GERD, gout, BPH c/b renal stones, obesity, mesenteric thrombosis, migraine, stage IIIa CKD (BL Scr 1.2-1.4), sleep apnea, CVA 5/23, recurrent UTIs. Pharmacy has been consulted for cefepime dosing.  Assessment: 8/17: Pt with PMH recurrent UTIs and CKD Stage 3a. Recently completed a course of Bactrim 7/28 >> 8/4 for the same issue. Renal imaging negative for acute processes and Bcx NGTD @ 1 day. Ucx growing 40,000 CFU/mL E.coli, susceptibilities pending.Pt no longer meets SIRS criteria: they are afebrile with VSS. WBCs trending down 21.2 >> 14.7. Pt's Scr is 1.34, which is his baseline.  Plan: Continue cefepime 2 g IV q8H Monitor Scr and renal function F/U Ucx E.coli susceptibilities as well as Bcx  Height: 6' (182.9 cm) Weight: 116.5 kg (256 lb 13.4 oz) IBW/kg (Calculated) : 77.6  Temp (24hrs), Avg:97.8 F (36.6 C), Min:97.6 F (36.4 C), Max:98.3 F (36.8 C)  Recent Labs  Lab 01/29/22 1641 01/30/22 0049 01/30/22 0214 01/30/22 1218  WBC 21.2*  --   --  14.7*  CREATININE 1.49*  --   --  1.34*  LATICACIDVEN  --  1.5 1.5  --     Estimated Creatinine Clearance: 73.4 mL/min (A) (by C-G formula based on SCr of 1.34 mg/dL (H)).    No Known Allergies  Antimicrobials this admission: Cefepime 8/16 >>   Microbiology results: 8/16 BCx: NGTD @ 1 day 8/15 UCx: 40,000 CFU/mL E. Coli (susceptibilities pending)  Thank you for allowing pharmacy to be a part of this patient's care.  Delena Bali 01/31/2022 8:53 AM

## 2022-01-31 NOTE — TOC Initial Note (Signed)
Transition of Care [facility]) - Initial/Assessment Note    Patient Details  Name: Devin White MRN: [id number] Date of Birth: 02/04/1956  Transition of [facility]) CM/SW Contact:    Candie Chroman, LCSW Phone Number: 01/31/2022, 10:00 AM  Clinical Narrative: Readmission prevention screen complete. CSW met with patient. Sister at bedside. CSW introduced role and explained that discharge planning would be discussed. PCP is Viviana Simpler, MD. His grandchildren transport him to appointments. Pharmacy is [facility] on the corner of [address]. No issues obtaining medications. [facility] [place] said he was still active with PT and RN but patient said he has been discharged from their services. He prefers not to restart services at this time. No DME use prior to admission although he does have a RW at home. No other DME at home. No further concerns. CSW encouraged patient to contact CSW as needed. CSW will continue to follow patient for support and facilitate return home once stable. One of his grandchildren will transport him home at discharge.            Expected Discharge Plan: Home/Self Care Barriers to Discharge: Continued Medical Work up   Patient Goals and CMS Choice        Expected Discharge Plan and Services Expected Discharge Plan: Home/Self Care     Post Acute Care Choice: NA Living arrangements for the past 2 months: Single Family Home                                      Prior Living Arrangements/Services Living arrangements for the past 2 months: Single Family Home   Patient language and need for interpreter reviewed:: Yes Do you feel safe going back to the place where you live?: Yes      Need for Family Participation in Patient Care: Yes (Comment)     Criminal Activity/Legal Involvement Pertinent to Current Situation/Hospitalization: No - Comment as needed  Activities of Daily Living Home Assistive Devices/Equipment:  None ADL Screening (condition at time of admission) Patient's cognitive ability adequate to safely complete daily activities?: Yes Is the patient deaf or have difficulty hearing?: No Does the patient have difficulty seeing, even when wearing glasses/contacts?: No Does the patient have difficulty concentrating, remembering, or making decisions?: No Patient able to express need for assistance with ADLs?: Yes Does the patient have difficulty dressing or bathing?: No Independently performs ADLs?: Yes (appropriate for developmental age) Does the patient have difficulty walking or climbing stairs?: No Weakness of Legs: None Weakness of Arms/Hands: None  Permission Sought/Granted Permission sought to share information with : Family Supports          Permission granted to share info w Relationship: Sister     Emotional Assessment Appearance:: Appears stated age Attitude/Demeanor/Rapport: Engaged, Gracious Affect (typically observed): Accepting, Appropriate, Calm, Pleasant Orientation: : Oriented to Self, Oriented to Place, Oriented to  Time, Oriented to Situation Alcohol / Substance Use: Not Applicable Psych Involvement: No (comment)  Admission diagnosis:  Pyelonephritis [X10] Complicated UTI (urinary tract infection) [N39.0] Sepsis without acute organ dysfunction, due to unspecified organism [facility]) [A41.9] Patient Active Problem List   Diagnosis Date Noted   Complicated UTI (urinary tract infection) 01/30/2022   Hypokalemia 12/27/2021   Severe sepsis ([place]) 12/26/2021   Acute UTI (urinary tract infection) 12/26/2021   Left sided numbness 11/14/2021   Obesity (BMI 30-39.9) 10/27/2021   Diarrhea  10/26/2021   Stage 3a chronic kidney disease (CKD) ([place]) 10/26/2021   Hyperbilirubinemia 10/26/2021   Hyperglycemia 10/26/2021   Chronic diastolic heart failure ([place]) 09/05/2021   Achilles tendon mass 09/25/2020   Joint pain 09/22/2020   Recurrent UTI 03/26/2019   Pulmonary nodule  08/04/2018   Liver transplant status ([place]) 10/10/2017   Advance directive discussed with patient 10/10/2017   Stage 3b chronic kidney disease ([place]) 10/09/2016   Mallory-Weiss tear 04/16/2016   Long-term use of immunosuppressant medication 04/08/2016   Sleep apnea 10/06/2015   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[place]) 04/10/2015   Immunosuppression ([place]) 06/01/2012   Hypertension    Routine general medical examination at a health care facility 04/26/2011   Chronic tophaceous gout 12/21/2008   BPH without urinary obstruction 12/21/2008   ALLERGIC RHINITIS 05/22/2007   GERD 05/22/2007   HIATAL HERNIA 05/22/2007   IRRITABLE BOWEL SYNDROME, HX OF 05/22/2007   RENAL CALCULUS, HX OF 05/22/2007   PCP:  Venia Carbon, MD Pharmacy:   [place], [place] - [address], [address], [place] [address] Phone: [phone] Fax: [facility] #[facility] [place], [place] - [address] [place] [address] Phone: [phone] Fax: [place] [address] Phone: [phone] Fax: [phone]     Social Determinants of Health (SDOH) Interventions    Readmission Risk Interventions    01/31/2022    9:58 AM 11/06/2021   11:09 AM  Readmission Risk Prevention Plan  Transportation Screening Complete Complete  PCP or Specialist Appt within 5-7 Days  Complete  PCP or Specialist Appt within 3-5 Days Complete   Home Care Screening  Complete  Medication Review (RN CM)  Referral to Pharmacy  Social Work Consult for [place] Planning/Counseling [place] Not Applicable   Medication Review Press photographer) Complete

## 2022-01-31 NOTE — Progress Notes (Signed)
Mobility Specialist - Progress Note    01/31/22 1313  Mobility  Activity Ambulated independently in room;Stood at bedside;Dangled on edge of bed  Level of Assistance Standby assist, set-up cues, supervision of patient - no hands on  Assistive Device None  Distance Ambulated (ft) 40 ft  Activity Response Tolerated well  $Mobility charge 1 Mobility   Pt supine in bed on RA upon arrival. Pt STS and ambulates around room SBA (no hands on) Pt returns to bed with needs in reach and bed alarm on.   Gretchen Short  Mobility Specialist  01/31/22 1:14 PM

## 2022-02-01 DIAGNOSIS — N390 Urinary tract infection, site not specified: Secondary | ICD-10-CM | POA: Diagnosis not present

## 2022-02-01 LAB — CBC
HCT: 39.6 % (ref 39.0–52.0)
Hemoglobin: 13.2 g/dL (ref 13.0–17.0)
MCH: 31.8 pg (ref 26.0–34.0)
MCHC: 33.3 g/dL (ref 30.0–36.0)
MCV: 95.4 fL (ref 80.0–100.0)
Platelets: 151 10*3/uL (ref 150–400)
RBC: 4.15 MIL/uL — ABNORMAL LOW (ref 4.22–5.81)
RDW: 13.8 % (ref 11.5–15.5)
WBC: 7.1 10*3/uL (ref 4.0–10.5)
nRBC: 0 % (ref 0.0–0.2)

## 2022-02-01 LAB — BASIC METABOLIC PANEL
Anion gap: 7 (ref 5–15)
BUN: 18 mg/dL (ref 8–23)
CO2: 23 mmol/L (ref 22–32)
Calcium: 8.9 mg/dL (ref 8.9–10.3)
Chloride: 112 mmol/L — ABNORMAL HIGH (ref 98–111)
Creatinine, Ser: 1.38 mg/dL — ABNORMAL HIGH (ref 0.61–1.24)
GFR, Estimated: 57 mL/min — ABNORMAL LOW (ref >60)
Glucose, Bld: 142 mg/dL — ABNORMAL HIGH (ref 70–99)
Potassium: 3.2 mmol/L — ABNORMAL LOW (ref 3.5–5.1)
Sodium: 142 mmol/L (ref 135–145)

## 2022-02-01 LAB — URINE CULTURE: Culture: 40000 — AB

## 2022-02-01 LAB — GLUCOSE, CAPILLARY: Glucose-Capillary: 112 mg/dL — ABNORMAL HIGH (ref 70–99)

## 2022-02-01 LAB — PROCALCITONIN: Procalcitonin: 0.16 ng/mL

## 2022-02-01 MED ORDER — LIDOCAINE 5 % EX PTCH
1.0000 | MEDICATED_PATCH | CUTANEOUS | Status: DC
  Administered 2022-02-01 – 2022-02-02 (×2): 1 via TRANSDERMAL
  Filled 2022-02-01 (×2): qty 1

## 2022-02-01 MED ORDER — POTASSIUM CHLORIDE CRYS ER 20 MEQ PO TBCR
20.0000 meq | EXTENDED_RELEASE_TABLET | Freq: Once | ORAL | Status: AC
  Administered 2022-02-01: 20 meq via ORAL
  Filled 2022-02-01: qty 1

## 2022-02-01 MED ORDER — DOCUSATE SODIUM 100 MG PO CAPS
100.0000 mg | ORAL_CAPSULE | Freq: Every day | ORAL | Status: DC
  Administered 2022-02-01 – 2022-02-02 (×2): 100 mg via ORAL
  Filled 2022-02-01 (×2): qty 1

## 2022-02-01 MED ORDER — POLYETHYLENE GLYCOL 3350 17 G PO PACK
17.0000 g | PACK | Freq: Two times a day (BID) | ORAL | Status: DC
  Administered 2022-02-01: 17 g via ORAL
  Filled 2022-02-01 (×3): qty 1

## 2022-02-01 MED ORDER — POTASSIUM CHLORIDE CRYS ER 20 MEQ PO TBCR
40.0000 meq | EXTENDED_RELEASE_TABLET | Freq: Once | ORAL | Status: AC
  Administered 2022-02-01: 40 meq via ORAL
  Filled 2022-02-01: qty 2

## 2022-02-01 MED ORDER — CEFAZOLIN SODIUM-DEXTROSE 1-4 GM/50ML-% IV SOLN
1.0000 g | Freq: Three times a day (TID) | INTRAVENOUS | Status: DC
  Filled 2022-02-01 (×3): qty 50

## 2022-02-01 MED ORDER — CEFAZOLIN SODIUM-DEXTROSE 1-4 GM/50ML-% IV SOLN
1.0000 g | Freq: Three times a day (TID) | INTRAVENOUS | Status: DC
  Administered 2022-02-01 – 2022-02-02 (×2): 1 g via INTRAVENOUS
  Filled 2022-02-01 (×4): qty 50

## 2022-02-01 NOTE — Care Management Important Message (Signed)
Important Message  Patient Details  Name: Devin White MRN: [id number] Date of Birth: 02/13/1956   Medicare Important Message Given:  Yes     Dannette Barbara 02/01/2022, 11:36 AM

## 2022-02-01 NOTE — Evaluation (Signed)
Physical Therapy Evaluation Patient Details Name: Devin White MRN: [id number] DOB: September 16, 1955 Today's Date: 02/01/2022  History of Present Illness  Pt is a 66 y/o M admitted on 01/29/22 after presenting with c/o 48-72 hrs of fever/chils/urinary frequency, associated flank pain, & leukocytosis. Of note, pt has hx of admisson 7/11-7/17 for UTI with severe sepsis. Pt is being treated for UTI, pyelonephritis, & sepsis. PMH: liver failure 2/2 NASH s/p liver transplant 2009, HTN, GERD, gout, BPH, renal stones, obesity, mesenteric thrombosis, CKD 3A, sleep apnea, CVA, recurrent UTIs  Clinical Impression  Pt seen for PT evaluation with pt agreeable to tx. Prior to admission pt was residing in a 2 level home with threshold step to enter, flight with L rail to access shower on 2nd level, and was independent without AD. On this date, pt is able to ambulate around [place] 7 steps with L rail with supervision with pt demonstrating fatigue after stairs but notes this is baseline. Pt encouraged to stay mobile while in hospital & discussed f/u PT but pt declines HHPT f/u. Will continue to follow pt acutely to address balance, endurance, and stair negotiation.      Recommendations for follow up therapy are one component of a multi-disciplinary discharge planning process, led by the attending physician.  Recommendations may be updated based on patient status, additional functional criteria and insurance authorization.  Follow Up Recommendations Home health PT      Assistance Recommended at Discharge PRN  Patient can return home with the following  Help with stairs or ramp for entrance    Equipment Recommendations None recommended by PT  Recommendations for Other Services       Functional Status Assessment Patient has had a recent decline in their functional status and demonstrates the ability to make significant improvements in function in a reasonable and predictable amount of time.      Precautions / Restrictions Precautions Precautions: Fall Restrictions Weight Bearing Restrictions: No      Mobility  Bed Mobility Overal bed mobility: Modified Independent             General bed mobility comments: supine<>sit with HOB elevated    Transfers Overall transfer level: Modified independent Equipment used: None               General transfer comment: STS from EOB    Ambulation/Gait Ambulation/Gait assistance: Supervision Gait Distance (Feet): 200 Feet Assistive device: None         General Gait Details: Slightly decreased hip flexion LLE during swing phase, decreased weight shift to L during stance phase, decreased LUE reciprocal arm swing.  Stairs Stairs: Yes Stairs assistance: Supervision Stair Management: One rail Left Number of Stairs: 7 General stair comments: Ascends stairs with step over step, descends with step to pattern.  Wheelchair Mobility    Modified Rankin (Stroke Patients Only)       Balance Overall balance assessment: Needs assistance Sitting-balance support: Feet supported Sitting balance-Leahy Scale: Good     Standing balance support: During functional activity, No upper extremity supported                                 Pertinent Vitals/Pain Pain Assessment Pain Assessment: 0-10 Pain Score: 7  Pain Location: R flank & around to bladder Pain Descriptors / Indicators:  (doesn't describe) Pain Intervention(s): Monitored during session (pt reports he has a pain patch applied)    Home  Living Family/patient expects to be discharged to:: Private residence Living Arrangements: Children (daughter) Available Help at Discharge: Family;Available PRN/intermittently (daughter works during the day) Type of Home: House Home Access: Stairs to enter Entrance Stairs-Rails: None Entrance Stairs-Number of Steps: 1 Alternate Level Stairs-Number of Steps: flight Home Layout: Bed/bath upstairs;Two  level Home Equipment: Conservation officer, nature (2 wheels);Rollator (4 wheels)      Prior Function               Mobility Comments: Pt reports he recently completed HHPT. Endorses decreased endurance at baseline, denies falls in the past 6 months, reports independence without AD, notes his daughter works during the day. Pt reports he doesn't drive 2/2 impaired vision. ADLs Comments: no longer drives since previous CVA     Hand Dominance   Dominant Hand: Right    Extremity/Trunk Assessment   Upper Extremity Assessment Upper Extremity Assessment: Overall WFL for tasks assessed    Lower Extremity Assessment Lower Extremity Assessment: Generalized weakness (pt with L UE/LE residual weakness but able to perform functional tasks)       Communication   Communication: No difficulties  Cognition Arousal/Alertness: Awake/alert Behavior During Therapy: WFL for tasks assessed/performed Overall Cognitive Status: Within Functional Limits for tasks assessed                                          General Comments General comments (skin integrity, edema, etc.): HR 73-103 bpm, educated pt on pursed lip breathing when feeling SOB after stair negotiation.    Exercises     Assessment/Plan    PT Assessment Patient needs continued PT services  PT Problem List Cardiopulmonary status limiting activity;Decreased activity tolerance;Decreased balance;Decreased mobility       PT Treatment Interventions DME instruction;Therapeutic exercise;Gait training;Balance training;Stair training;Neuromuscular re-education;Functional mobility training;Patient/family education;Therapeutic activities    PT Goals (Current goals can be found in the Care Plan section)  Acute Rehab PT Goals Patient Stated Goal: quit coming into the hospital so often, get rid of infection PT Goal Formulation: With patient Time For Goal Achievement: 02/15/22    Frequency Min 2X/week     Co-evaluation                AM-PAC PT "6 Clicks" Mobility  Outcome Measure Help needed turning from your back to your side while in a flat bed without using bedrails?: None Help needed moving from lying on your back to sitting on the side of a flat bed without using bedrails?: None Help needed moving to and from a bed to a chair (including a wheelchair)?: None Help needed standing up from a chair using your arms (e.g., wheelchair or bedside chair)?: None Help needed to walk in hospital room?: A Little Help needed climbing 3-5 steps with a railing? : A Little 6 Click Score: 22    End of Session   Activity Tolerance: Patient tolerated treatment well Patient left: in bed;with call bell/phone within reach Nurse Communication: Mobility status PT Visit Diagnosis: Muscle weakness (generalized) (M62.81);Other abnormalities of gait and mobility (R26.89)    Time: 1005-1017 PT Time Calculation (min) (ACUTE ONLY): 12 min   Charges:   PT Evaluation $PT Eval Low Complexity: [place], PT, DPT 02/01/22, 12:57 PM   Waunita Schooner 02/01/2022, 12:56 PM

## 2022-02-01 NOTE — Progress Notes (Signed)
PROGRESS NOTE    Devin White  IHK:[id number] DOB: [birth year] DOA: 01/29/2022 PCP: Venia Carbon, MD    Brief Narrative:  Devin White is a 66 y.o. male with medical history significant of  liver failure  due to NASH s/p liver transplant 2009,  patient is maintained on  mycophenolate, prednisone and tacrolimus. Patient also has history of HTN, GERD, GOUT, BPH renal stones, obesity, mesenteric thrombosis, stage IIIa CKD, sleep apnea ,CVA 5/23, recurrent UTIs who presents with 48-72 hours of fever/chills/ urinary frequency associated with flank pain as well as leukocytosis. Of note patient has interim history of  admission 7/11-7/17 for uti with severe sepsis, of note patient also was recently place on bactrim prescribed on 7/31 due to isolated leukocytosis with concern for recurrent uti. Patient is of note s/p course of abx currently.  Patient currently states continues to have chills, lower abdominal pain, and flank pain as well as nausea  8/17 still with suprapubic pain.  Bladder scan postvoid was 34 mL.  Overall improving slowly  8/18 patient starting to feel better.  Still with right CVA tenderness/back pain no other complaints  Consultants:    Procedures:   Antimicrobials:  Cefepime*    Subjective: As above  Objective: Vitals:   02/01/22 0333 02/01/22 0438 02/01/22 0817 02/01/22 1132  BP: 137/87  (!) 145/94 (!) 125/92  Pulse: 61  61 64  Resp: '19  19 17  '$ Temp: 97.7 F (36.5 C)  98.2 F (36.8 C) 98.1 F (36.7 C)  TempSrc: Oral  Oral   SpO2: 96%  95% 97%  Weight:  115.7 kg    Height:        Intake/Output Summary (Last 24 hours) at 02/01/2022 1311 Last data filed at 02/01/2022 1053 Gross per 24 hour  Intake 660 ml  Output 1900 ml  Net -1240 ml   Filed Weights   01/29/22 1618 01/31/22 0341 02/01/22 0438  Weight: 116.6 kg 116.5 kg 115.7 kg    Examination: Calm, NAD Cta no w/r Reg s1/s2 no gallop Soft benign +bs No edema Aaoxox3  Mood and affect  appropriate in current setting     Data Reviewed: I have personally reviewed following labs and imaging studies  CBC: Recent Labs  Lab 01/29/22 1641 01/30/22 1218 02/01/22 1001  WBC 21.2* 14.7* 7.1  NEUTROABS 17.6*  --   --   HGB 14.3 12.6* 13.2  HCT 43.0 37.4* 39.6  MCV 95.8 94.2 95.4  PLT 175 138* 756   Basic Metabolic Panel: Recent Labs  Lab 01/29/22 1641 01/30/22 1218 02/01/22 1001  NA 140  --  142  K 3.7  --  3.2*  CL 110  --  112*  CO2 22  --  23  GLUCOSE 146*  --  142*  BUN 16  --  18  CREATININE 1.49* 1.34* 1.38*  CALCIUM 9.2  --  8.9   GFR: Estimated Creatinine Clearance: 71 mL/min (A) (by C-G formula based on SCr of 1.38 mg/dL (H)). Liver Function Tests: Recent Labs  Lab 01/29/22 1641  AST 20  ALT 34  ALKPHOS 51  BILITOT 1.3*  PROT 7.0  ALBUMIN 4.1   Recent Labs  Lab 01/29/22 1641  LIPASE 21   No results for input(s): "AMMONIA" in the last 168 hours. Coagulation Profile: Recent Labs  Lab 01/30/22 0049  INR 1.0   Cardiac Enzymes: No results for input(s): "CKTOTAL", "CKMB", "CKMBINDEX", "TROPONINI" in the last 168 hours. BNP (last 3 results) No results  for input(s): "PROBNP" in the last 8760 hours. HbA1C: Recent Labs    01/30/22 0049  HGBA1C 5.1   CBG: Recent Labs  Lab 01/30/22 0752 01/31/22 0735 01/31/22 1200 01/31/22 1548 02/01/22 0815  GLUCAP 118* 97 147* 167* 112*   Lipid Profile: No results for input(s): "CHOL", "HDL", "LDLCALC", "TRIG", "CHOLHDL", "LDLDIRECT" in the last 72 hours. Thyroid Function Tests: Recent Labs    01/30/22 0049  TSH 3.504   Anemia Panel: No results for input(s): "VITAMINB12", "FOLATE", "FERRITIN", "TIBC", "IRON", "RETICCTPCT" in the last 72 hours. Sepsis Labs: Recent Labs  Lab 01/30/22 0049 01/30/22 0214 01/31/22 0427 02/01/22 0416  PROCALCITON 0.24  --  0.15 0.16  LATICACIDVEN 1.5 1.5  --   --     Recent Results (from the past 240 hour(s))  Urine Culture     Status: Abnormal    Collection Time: 01/29/22  4:20 PM   Specimen: Urine, Random  Result Value Ref Range Status   Specimen Description   Final    URINE, RANDOM Performed at [facility], [address] [place], [place] [address]    Special Requests   Final    NONE Performed at [facility], [address] [place], [place] [address]    Culture 40,000 COLONIES/mL ESCHERICHIA COLI (A)  Final   Report Status 02/01/2022 FINAL  Final   Organism ID, Bacteria ESCHERICHIA COLI (A)  Final      Susceptibility   Escherichia coli - MIC*    AMPICILLIN >=32 RESISTANT Resistant     CEFAZOLIN <=4 SENSITIVE Sensitive     CEFEPIME <=0.12 SENSITIVE Sensitive     CEFTRIAXONE <=0.25 SENSITIVE Sensitive     CIPROFLOXACIN <=0.25 SENSITIVE Sensitive     GENTAMICIN <=1 SENSITIVE Sensitive     IMIPENEM <=0.25 SENSITIVE Sensitive     NITROFURANTOIN <=16 SENSITIVE Sensitive     TRIMETH/SULFA <=20 SENSITIVE Sensitive     AMPICILLIN/SULBACTAM 16 INTERMEDIATE Intermediate     PIP/TAZO <=4 SENSITIVE Sensitive     * 40,000 COLONIES/mL ESCHERICHIA COLI  Blood Culture (routine x 2)     Status: None (Preliminary result)   Collection Time: 01/30/22 12:49 AM   Specimen: BLOOD  Result Value Ref Range Status   Specimen Description BLOOD RIGHT ASSIST CONTROL  Final   Special Requests   Final    BOTTLES DRAWN AEROBIC AND ANAEROBIC Blood Culture results may not be optimal due to an inadequate volume of blood received in culture bottles   Culture   Final    NO GROWTH 2 DAYS Performed at [facility], [address] [place], [place] [address]    Report Status PENDING  Incomplete  Blood Culture (routine x 2)     Status: None (Preliminary result)   Collection Time: 01/30/22 12:49 AM   Specimen: BLOOD  Result Value Ref Range Status   Specimen Description BLOOD LEFT ASSIST CONTROL  Final   Special Requests   Final    BOTTLES DRAWN AEROBIC AND ANAEROBIC Blood Culture adequate volume   Culture   Final    NO  GROWTH 2 DAYS Performed at [facility], [address], [place], [place] [address]    Report Status PENDING  Incomplete         Radiology Studies: No results found.      Scheduled Meds:  amLODipine  2.5 mg Oral Daily   aspirin  325 mg Oral Daily   docusate sodium  100 mg Oral Daily   heparin  5,000  Units Subcutaneous Q8H   lidocaine  1 patch Transdermal Q24H   mycophenolate  250 mg Oral QHS   mycophenolate  500 mg Oral q morning   pantoprazole  40 mg Oral Daily   polyethylene glycol  17 g Oral BID   predniSONE  10 mg Oral Q breakfast   rosuvastatin  5 mg Oral QPM   tacrolimus  0.5 mg Oral q morning   tacrolimus  1 mg Oral QHS   tamsulosin  0.4 mg Oral Daily   topiramate  50 mg Oral BID   Continuous Infusions:  ceFEPime (MAXIPIME) IV 2 g (02/01/22 0849)    Assessment & Plan:   Principal Problem:   Complicated UTI (urinary tract infection) Active Problems:   GERD   Hypertension   Liver transplant status ([place])   Acute pyelonephritis   Sepsis ([place])  UTI/Pyelonephritis  Sepsis -in setting of chronic immunosuppressive therapy  -admit to progressive care  - start on broad spectrum abx with cefepime due to hx of recurrent uti , know renal stones which may be nidus for infection and recent antibiotic course  -patient may also need urology consult out patient re renal stones in setting recurrent UTI  -supportive care with ivfs, antiemetics , pain medications 8/17 renal ultrasound without any abscess 8/18 WBC down  urine culture with E. Coli Continue IV antibiotics and still symptomatic, blood cultures negative to date    Liver failure  due to NASH - s/p liver transplant 2009   -patient is maintained on  mycophenolate, prednisone and tacrolimus. 8/17 IV Lasix as needed 8/18 continue immunosuppressant    Hypokalemia K3.2 We will place and monitor    HTN Stable continue current regimen     GERD Continue PPI    GOUT No acute flares     BPH Continue tamsulosin    CKDIIIa -cr close to baseline     OSA -cpap as able   PT rec. HH     DVT prophylaxis: Heparin subcu Code Status: Full Family Communication: None bedside Disposition Plan: Home Status is: Inpatient Remains inpatient appropriate because: IV treatment.          LOS: 2 days   Time spent: 35 minutes    Nolberto Hanlon, MD [facility] Pager [phone]  If 7PM-7AM, please contact night-coverage 02/01/2022, 1:11 PM

## 2022-02-01 NOTE — Progress Notes (Signed)
Mobility Specialist - Progress Note    02/01/22 0938  Mobility  Activity Ambulated independently in hallway;Stood at bedside;Dangled on edge of bed  Level of Assistance Independent  Assistive Device None  Distance Ambulated (ft) 160 ft  Activity Response Tolerated well  $Mobility charge 1 Mobility   Pt supine in bed on RA upon arrival. Pt raises height of bed but able to scoot to edge of bed indep. Pt STS and ambulates 1 lap indep. Pt returns to bed with needs in reach, bed alarm on, and RN in room.   Gretchen Short  Mobility Specialist  02/01/22 9:39 AM

## 2022-02-01 NOTE — Consult Note (Signed)
   [facility] CM Inpatient Consult   02/01/2022  ABDURRAHMAN PETERSHEIM 14-Sep-1955 [id number]   [facility] [ACO] Patient: [facility] Liaison coverage for patient at [facility]   Primary Care Provider: Venia Carbon, MD, [place] at [facility], is an Independent Embedded provider with a Care Management team and program and is listed for the [facility] follow up needs    Patient screened for less than 30 days readmission hospitalization with noted high risk score for unplanned readmission risk.  Reviewed to assess for potential [place] Management service needs for post hospital transition for readmission prevention needs.  Review of patient's medical record reveals patient is currently active with Embedded pharmacist on Care Teams list.      Plan:  Continue to follow progress and disposition to assess for post hospital care management needs.     For questions contact:    Natividad Brood, RN BSN [facility] Liaison  [phone] business mobile phone Toll free office [phone]  Fax number: [phone] [email or web address]

## 2022-02-02 DIAGNOSIS — N390 Urinary tract infection, site not specified: Secondary | ICD-10-CM | POA: Diagnosis not present

## 2022-02-02 LAB — GLUCOSE, CAPILLARY: Glucose-Capillary: 108 mg/dL — ABNORMAL HIGH (ref 70–99)

## 2022-02-02 MED ORDER — LIDOCAINE 5 % EX PTCH: 1.0000 | MEDICATED_PATCH | CUTANEOUS | 0 refills | Status: AC

## 2022-02-02 MED ORDER — DOCUSATE SODIUM 100 MG PO CAPS: 100.0000 mg | ORAL_CAPSULE | Freq: Every day | ORAL | 0 refills | Status: AC

## 2022-02-02 MED ORDER — CEFADROXIL 500 MG PO CAPS: 1000.0000 mg | ORAL_CAPSULE | Freq: Two times a day (BID) | ORAL | 0 refills | Status: AC

## 2022-02-02 NOTE — Progress Notes (Signed)
Pt verbalized that his daughter is at the [place] entrance for pt discharge; pt discharged via wheelchair by nursing to the [place] entrance

## 2022-02-02 NOTE — Discharge Summary (Signed)
Devin White WPY:[id number] DOB: Sep 25, 1955 DOA: 01/29/2022  PCP: Venia Carbon, MD  Admit date: 01/29/2022 Discharge date: 02/02/2022  Admitted From: Home   Disposition: Home  Recommendations for Outpatient Follow-up:  Follow up with PCP in 1 week Please obtain BMP/CBC in one week Please follow up with urology in 1-2 weekss      Discharge Condition:Stable CODE STATUS:full  Diet recommendation: Heart Healthy  Brief/Interim Summary: Per Devin White is a 66 y.o. male with medical history significant of  liver failure  due to NASH s/p liver transplant 2009,  patient is maintained on  mycophenolate, prednisone and tacrolimus. Patient also has history of HTN, GERD, GOUT, BPH renal stones, obesity, mesenteric thrombosis, stage IIIa CKD, sleep apnea ,CVA 5/23, recurrent UTIs who presents with 48-72 hours of fever/chills/ urinary frequency associated with flank pain as well as leukocytosis. Of note patient has interim history of  admission 7/11-7/17 for uti with severe sepsis, of note patient also was recently place on bactrim prescribed on 7/31 due to isolated leukocytosis with concern for recurrent uti. Patient is of note s/p course of abx currently.  Patient continued having lower abdominal pain, and flank pain as well as nausea.  He was started on IV antibiotics.  Urine culture was obtained revealing E. Coli.  His symptoms have improved.    UTI/Pyelonephritis  Sepsis -in setting of chronic immunosuppressive therapy  Was started on IV cefepime Renal ultrasound without any abscess Leukocytosis improved urine culture with E. Coli Will transition to p.o. antibiotics to complete course       Liver failure  due to NASH - s/p liver transplant 2009   -patient is maintained on  mycophenolate, prednisone and tacrolimus.    Hypokalemia Replaced    HTN Continue home regimen     GERD Continue PPI    GOUT No acute flares    BPH Continue tamsulosin    CKDIIIa At  baseline    OSA -cpap as able    Devin White rec. [place]  Discharge Diagnoses:  Principal Problem:   Complicated UTI (urinary tract infection) Active Problems:   GERD   Hypertension   Liver transplant status ([place])   Acute pyelonephritis   Sepsis [facility])    Discharge Instructions  Discharge Instructions     Call MD for:  temperature >100.4   Complete by: As directed    Diet - low sodium heart healthy   Complete by: As directed    Discharge instructions   Complete by: As directed    Take your antibiotics Follow-up with PCP next week Follow-up with urology   Increase activity slowly   Complete by: As directed       Allergies as of 02/02/2022   No Known Allergies      Medication List     STOP taking these medications    ondansetron 4 MG tablet Commonly known as: ZOFRAN       TAKE these medications    acetaminophen 325 MG tablet Commonly known as: TYLENOL Take 650 mg by mouth every 6 (six) hours as needed for mild pain.   amLODipine 5 MG tablet Commonly known as: NORVASC Take 1/2 tablet (2.5 mg total) by mouth daily.   ascorbic acid 500 MG tablet Commonly known as: VITAMIN C Take 1 tablet (500 mg total) by mouth daily.   aspirin 325 MG tablet Take 1 tablet (325 mg total) by mouth daily.   calcium carbonate 500 MG chewable tablet Commonly known as: Tums Chew 1 tablet (  200 mg of elemental calcium total) by mouth 2 (two) times daily as needed for indigestion or heartburn.   cefadroxil 500 MG capsule Commonly known as: DURICEF Take 2 capsules (1,000 mg total) by mouth 2 (two) times daily for 7 days.   colchicine 0.6 MG tablet Take 1 tablet (0.6 mg total) by mouth daily as needed. What changed: when to take this   diclofenac Sodium 1 % Gel Commonly known as: VOLTAREN Apply 4 g topically 4 (four) times daily. What changed:  when to take this reasons to take this   docusate sodium 100 MG capsule Commonly known as: COLACE Take 1 capsule (100 mg total) by  mouth daily for 7 days.   furosemide 40 MG tablet Commonly known as: LASIX Take 1 tablet (40 mg total) by mouth daily.   lidocaine 5 % Commonly known as: LIDODERM Place 1 patch onto the skin daily for 14 days. Remove & Discard patch within 12 hours or as directed by MD   lisinopril 5 MG tablet Commonly known as: ZESTRIL Take 1/2 tablet (2.5 mg total) by mouth daily.   multivitamin with minerals Tabs tablet Take 1 tablet by mouth daily.   mycophenolate 250 MG capsule Commonly known as: CELLCEPT Take 250-500 mg by mouth 2 (two) times daily. 2 capsules (500 mg) in the morning and one capsule (250 mg) in the evening   omeprazole 20 MG capsule Commonly known as: PRILOSEC TAKE 1 CAPSULE BY MOUTH TWICE DAILY What changed:  how much to take how to take this when to take this   predniSONE 10 MG tablet Commonly known as: DELTASONE Take 10 mg by mouth daily with breakfast.   rosuvastatin 5 MG tablet Commonly known as: CRESTOR Take 1 tablet (5 mg total) by mouth every evening.   senna 8.6 MG Tabs tablet Commonly known as: SENOKOT Take 2 tablets (17.2 mg total) by mouth daily.   SUMAtriptan 100 MG tablet Commonly known as: IMITREX Take 1 tablet (100 mg total) by mouth once as needed for migraine (may repeat x 1 in 24 hrs not more than 2 days per week. total 9 tablets). May repeat in 2 hours if headache persists or recurs.   tacrolimus 0.5 MG capsule Commonly known as: PROGRAF Take 0.5-1 mg by mouth See admin instructions. Taking 0.5 mg in the morning and 1 mg at bedtime   tamsulosin 0.4 MG Caps capsule Commonly known as: FLOMAX TAKE 1 CAPSULE(0.4 MG) BY MOUTH DAILY What changed: See the new instructions.   topiramate 50 MG tablet Commonly known as: TOPAMAX Take 1 tablet (50 mg total) by mouth 2 (two) times daily.   vitamin A 3 MG (10000 UNITS) capsule Take 1 capsule (10,000 Units total) by mouth daily.   zinc sulfate 220 (50 Zn) MG capsule Take 1 capsule (220 mg  total) by mouth daily.        Follow-up Information     Viviana Simpler I, MD Follow up in 1 week(s).   Specialties: Internal Medicine, Pediatrics Contact information: [place]  [phone]                No Known Allergies  Consultations:    Procedures/Studies: US RENAL  Result Date: 01/30/2022 CLINICAL DATA:  66 year old male with UTI. EXAM: RENAL / URINARY TRACT ULTRASOUND COMPLETE COMPARISON:  CT Abdomen and Pelvis 12/26/2021. FINDINGS: Right Kidney: Renal measurements: 11.9 x 6.2 x 5.4 cm = volume: 209 mL. Cortical thinning as demonstrated by CT last  month. No hydronephrosis. No solid right renal mass. 2 small upper pole simple appearing cysts (the larger 2.8 cm image 19, follow-up imaging recommended). Left Kidney: Renal measurements: 13.1 x 6.7 x 5.3 cm = volume: 245 mL. Cortical thinning as demonstrated by CT last month. No hydronephrosis. No solid mass. Upper pole simple appearing cyst 3.5 cm (image 37), and smaller 1.5 cm lower pole cyst with a single thin septation but no convincing vascular elements (image 46, no follow-up imaging recommended). Bladder: Appears normal for degree of bladder distention. Other: None. IMPRESSION: 1. No acute renal findings. 2. Bilateral renal cortical thinning as seen by CT last month. Electronically Signed   By: Genevie Ann M.D.   On: 01/30/2022 03:59   DG Chest Port 1 View  Result Date: 01/30/2022 CLINICAL DATA:  Questionable sepsis, evaluate for abnormality. EXAM: PORTABLE CHEST 1 VIEW COMPARISON:  Radiographs 12/26/2021 FINDINGS: Borderline cardiomegaly. No focal consolidation, pleural effusion or pneumothorax. No acute osseous abnormality. Surgical clips near the GE junction. IMPRESSION: No active disease. Electronically Signed   By: Placido Sou M.D.   On: 01/30/2022 00:16      Subjective: Feels better.  No CVA tenderness.  Discharge Exam: Vitals:   02/02/22 0432 02/02/22 1009  BP: (!)  159/90 (!) 140/85  Pulse: 66 70  Resp: 17   Temp: 98 F (36.7 C) 97.8 F (36.6 C)  SpO2: 99% 98%   Vitals:   02/01/22 1748 02/01/22 2054 02/02/22 0432 02/02/22 1009  BP: (!) 159/98 (!) 147/86 (!) 159/90 (!) 140/85  Pulse: (!) 56 63 66 70  Resp: '18 16 17   '$ Temp: 98 F (36.7 C) 98 F (36.7 C) 98 F (36.7 C) 97.8 F (36.6 C)  TempSrc:  Oral Oral Oral  SpO2: 97% 94% 99% 98%  Weight:   112.9 kg   Height:        General: Devin White is alert, awake, not in acute distress Cardiovascular: RRR, S1/S2 +, no rubs, no gallops Respiratory: CTA bilaterally, no wheezing, no rhonchi Abdominal: Soft, NT, ND, bowel sounds + Extremities: no edema, no cyanosis    The results of significant diagnostics from this hospitalization (including imaging, microbiology, ancillary and laboratory) are listed below for reference.     Microbiology: Recent Results (from the past 240 hour(s))  Urine Culture     Status: Abnormal   Collection Time: 01/29/22  4:20 PM   Specimen: Urine, Random  Result Value Ref Range Status   Specimen Description   Final    URINE, RANDOM Performed at [facility], [address], [place], [place] [address]    Special Requests   Final    NONE Performed at [facility], [place], [place] [address]    Culture 40,000 COLONIES/mL ESCHERICHIA COLI (A)  Final   Report Status 02/01/2022 FINAL  Final   Organism ID, Bacteria ESCHERICHIA COLI (A)  Final      Susceptibility   Escherichia coli - MIC*    AMPICILLIN >=32 RESISTANT Resistant     CEFAZOLIN <=4 SENSITIVE Sensitive     CEFEPIME <=0.12 SENSITIVE Sensitive     CEFTRIAXONE <=0.25 SENSITIVE Sensitive     CIPROFLOXACIN <=0.25 SENSITIVE Sensitive     GENTAMICIN <=1 SENSITIVE Sensitive     IMIPENEM <=0.25 SENSITIVE Sensitive     NITROFURANTOIN <=16 SENSITIVE Sensitive     TRIMETH/SULFA <=20 SENSITIVE Sensitive     AMPICILLIN/SULBACTAM 16 INTERMEDIATE Intermediate     PIP/TAZO <=4 SENSITIVE  Sensitive     *  40,000 COLONIES/mL ESCHERICHIA COLI  Blood Culture (routine x 2)     Status: None (Preliminary result)   Collection Time: 01/30/22 12:49 AM   Specimen: BLOOD  Result Value Ref Range Status   Specimen Description BLOOD RIGHT ASSIST CONTROL  Final   Special Requests   Final    BOTTLES DRAWN AEROBIC AND ANAEROBIC Blood Culture results may not be optimal due to an inadequate volume of blood received in culture bottles   Culture   Final    NO GROWTH 3 DAYS Performed at [facility], [address] [place], [place] [address]    Report Status PENDING  Incomplete  Blood Culture (routine x 2)     Status: None (Preliminary result)   Collection Time: 01/30/22 12:49 AM   Specimen: BLOOD  Result Value Ref Range Status   Specimen Description BLOOD LEFT ASSIST CONTROL  Final   Special Requests   Final    BOTTLES DRAWN AEROBIC AND ANAEROBIC Blood Culture adequate volume   Culture   Final    NO GROWTH 3 DAYS Performed at [facility], [address] [place], [place] [address]    Report Status PENDING  Incomplete     Labs: BNP (last 3 results) Recent Labs    09/14/21 1534  BNP 09.8   Basic Metabolic Panel: Recent Labs  Lab 01/29/22 1641 01/30/22 1218 02/01/22 1001  NA 140  --  142  K 3.7  --  3.2*  CL 110  --  112*  CO2 22  --  23  GLUCOSE 146*  --  142*  BUN 16  --  18  CREATININE 1.49* 1.34* 1.38*  CALCIUM 9.2  --  8.9   Liver Function Tests: Recent Labs  Lab 01/29/22 1641  AST 20  ALT 34  ALKPHOS 51  BILITOT 1.3*  PROT 7.0  ALBUMIN 4.1   Recent Labs  Lab 01/29/22 1641  LIPASE 21   No results for input(s): "AMMONIA" in the last 168 hours. CBC: Recent Labs  Lab 01/29/22 1641 01/30/22 1218 02/01/22 1001  WBC 21.2* 14.7* 7.1  NEUTROABS 17.6*  --   --   HGB 14.3 12.6* 13.2  HCT 43.0 37.4* 39.6  MCV 95.8 94.2 95.4  PLT 175 138* 151   Cardiac Enzymes: No results for input(s): "CKTOTAL", "CKMB", "CKMBINDEX", "TROPONINI"  in the last 168 hours. BNP: Invalid input(s): "POCBNP" CBG: Recent Labs  Lab 01/31/22 0735 01/31/22 1200 01/31/22 1548 02/01/22 0815 02/02/22 0812  GLUCAP 97 147* 167* 112* 108*   D-Dimer No results for input(s): "DDIMER" in the last 72 hours. Hgb A1c No results for input(s): "HGBA1C" in the last 72 hours. Lipid Profile No results for input(s): "CHOL", "HDL", "LDLCALC", "TRIG", "CHOLHDL", "LDLDIRECT" in the last 72 hours. Thyroid function studies No results for input(s): "TSH", "T4TOTAL", "T3FREE", "THYROIDAB" in the last 72 hours.  Invalid input(s): "FREET3" Anemia work up No results for input(s): "VITAMINB12", "FOLATE", "FERRITIN", "TIBC", "IRON", "RETICCTPCT" in the last 72 hours. Urinalysis    Component Value Date/Time   COLORURINE YELLOW (A) 01/29/2022 1620   APPEARANCEUR CLOUDY (A) 01/29/2022 1620   APPEARANCEUR Clear 11/30/2013 1959   LABSPEC 1.013 01/29/2022 1620   LABSPEC 1.017 11/30/2013 1959   PHURINE 5.0 01/29/2022 1620   GLUCOSEU NEGATIVE 01/29/2022 1620   GLUCOSEU Negative 11/30/2013 1959   HGBUR NEGATIVE 01/29/2022 1620   HGBUR negative 05/25/2010 1153   BILIRUBINUR NEGATIVE 01/29/2022 1620   BILIRUBINUR Negative 11/30/2013 [place] NEGATIVE 01/29/2022 1620  PROTEINUR NEGATIVE 01/29/2022 1620   UROBILINOGEN 0.2 08/25/2012 2256   NITRITE NEGATIVE 01/29/2022 1620   LEUKOCYTESUR LARGE (A) 01/29/2022 1620   LEUKOCYTESUR Negative 11/30/2013 1959   Sepsis Labs Recent Labs  Lab 01/29/22 1641 01/30/22 1218 02/01/22 1001  WBC 21.2* 14.7* 7.1   Microbiology Recent Results (from the past 240 hour(s))  Urine Culture     Status: Abnormal   Collection Time: 01/29/22  4:20 PM   Specimen: Urine, Random  Result Value Ref Range Status   Specimen Description   Final    URINE, RANDOM Performed at [facility], [address] [place], [place] [address]    Special Requests   Final    NONE Performed at [facility], [address] [place], [place] [address]    Culture 40,000 COLONIES/mL ESCHERICHIA COLI (A)  Final   Report Status 02/01/2022 FINAL  Final   Organism ID, Bacteria ESCHERICHIA COLI (A)  Final      Susceptibility   Escherichia coli - MIC*    AMPICILLIN >=32 RESISTANT Resistant     CEFAZOLIN <=4 SENSITIVE Sensitive     CEFEPIME <=0.12 SENSITIVE Sensitive     CEFTRIAXONE <=0.25 SENSITIVE Sensitive     CIPROFLOXACIN <=0.25 SENSITIVE Sensitive     GENTAMICIN <=1 SENSITIVE Sensitive     IMIPENEM <=0.25 SENSITIVE Sensitive     NITROFURANTOIN <=16 SENSITIVE Sensitive     TRIMETH/SULFA <=20 SENSITIVE Sensitive     AMPICILLIN/SULBACTAM 16 INTERMEDIATE Intermediate     PIP/TAZO <=4 SENSITIVE Sensitive     * 40,000 COLONIES/mL ESCHERICHIA COLI  Blood Culture (routine x 2)     Status: None (Preliminary result)   Collection Time: 01/30/22 12:49 AM   Specimen: BLOOD  Result Value Ref Range Status   Specimen Description BLOOD RIGHT ASSIST CONTROL  Final   Special Requests   Final    BOTTLES DRAWN AEROBIC AND ANAEROBIC Blood Culture results may not be optimal due to an inadequate volume of blood received in culture bottles   Culture   Final    NO GROWTH 3 DAYS Performed at [facility], [address], [place], [place] [address]    Report Status PENDING  Incomplete  Blood Culture (routine x 2)     Status: None (Preliminary result)   Collection Time: 01/30/22 12:49 AM   Specimen: BLOOD  Result Value Ref Range Status   Specimen Description BLOOD LEFT ASSIST CONTROL  Final   Special Requests   Final    BOTTLES DRAWN AEROBIC AND ANAEROBIC Blood Culture adequate volume   Culture   Final    NO GROWTH 3 DAYS Performed at [facility], [address] [place], [place] [address]    Report Status PENDING  Incomplete     Time coordinating discharge: Over 30 minutes  SIGNED:   Nolberto Hanlon, MD  [facility] 02/02/2022, 11:36 AM Pager   If 7PM-7AM, please contact  night-coverage [email or web address] Password [email or web address]

## 2022-02-02 NOTE — TOC Transition Note (Signed)
Transition of Care [facility]) - CM/SW Discharge Note   Patient Details  Name: Devin White MRN: [id number] Date of Birth: 11-May-1956  Transition of Care [facility]) CM/SW Contact:  Izola Price, RN Phone Number: 02/02/2022, 10:12 AM   Clinical Narrative:  8/19: Patient to be discharged today. Per TOC assessment note, patient was active with [facility] but did not wish to restart services. Has a RW at home but no DME recommended by therapy.  Simmie Davies RN CM   Discharge to do:  Aug28 Telephone Call with Care Management Staff Monday Feb 11, 2022 2:15 PM (Arrive by 2:00 PM) THIS VISIT IS PHONE CALL ONLY. Kindly Note - if you are scheduled for a phone call, we may call you at any time during the day on which the call is scheduled. If we are unable to reach you, we will call you again OR you may return a call to [place] at your convenience. [place] at [place]  EUM35 Physical with Viviana Simpler, MD Wednesday May 08, 2022 10:15 AM (Arrive by 10:00 AM) Patient should bring all necessary paperwork to be completed.  Arrive 15 minutes prior to the appointment. Therapist, music at [facility] [place] [address] [place] RN CM  Final next level of care: Home/Self Care (Declined restart of [facility] service via [facility].) Barriers to Discharge: Barriers Resolved   Patient Goals and CMS Choice        Discharge Placement                       Discharge Plan and Services     Post Acute Care Choice: NA          DME Arranged: N/A DME Agency: NA       HH Arranged: NA HH Agency: [facility] (Was active with [facility] but declined to restart services.)        Social Determinants of Health (SDOH) Interventions     Readmission Risk Interventions    01/31/2022    9:58 AM 11/06/2021   11:09 AM  Readmission Risk Prevention Plan  Transportation Screening Complete Complete  PCP or Specialist Appt within 5-7 Days   Complete  PCP or Specialist Appt within 3-5 Days Complete   Home Care Screening  Complete  Medication Review (RN CM)  Referral to Pharmacy  Social Work Consult for [place] Planning/Counseling [place] Not Applicable   Medication Review Press photographer) Complete

## 2022-02-02 NOTE — Progress Notes (Signed)
MD order received in [facility] to discharge pt home today; see separate TOC note in [facility] regarding pt's wishes not to restart home health services with [facility], pt has rolling walker at home too, no other DME needs; verbally reviewed AVS with pt, Rxs escribed to the [facility] at [facility] [address] [facility] [address] [facility] [address] in [place], [place]; no questions voiced at this time; pt's discharge pending arrival of his daughter for a ride home; pt verbalized that his daughter should be here in about an hour, advised him to tell her to come to the [place] entrance and call him when she is out there in her care and staff will bring him out to the car; verbally instructed pt to call out when his daughter is at the [place] entrance in order for staff to take him to the [place] entrance for discharge

## 2022-02-02 NOTE — Progress Notes (Signed)
Mobility Specialist - Progress Note  Post-mobility: SPO2 94%   02/02/22 0938  Mobility  Activity Ambulated independently in hallway  Level of Assistance Independent  Assistive Device None  Distance Ambulated (ft) 160 ft  Activity Response Tolerated well  $Mobility charge 1 Mobility   Pt supine upon entry, utilizing RA. Pt ambulated one lap around NS with no AD. Pt had some shortness of breath once back in room, O2 saturation at 94% when sitting down. Pt left supine with needs in reach. No complaints.   [place] Mobility Specialist 02/02/22 9:45 AM

## 2022-02-02 NOTE — Plan of Care (Signed)

## 2022-02-04 ENCOUNTER — Other Ambulatory Visit: Payer: Self-pay | Admitting: *Deleted

## 2022-02-04 LAB — CULTURE, BLOOD (ROUTINE X 2)
Culture: NO GROWTH
Culture: NO GROWTH
Special Requests: ADEQUATE

## 2022-02-04 NOTE — Patient Outreach (Signed)
  Care Coordination [facility] Note Transition Care Management Follow-up Telephone Call Date of discharge and from where: 02/02/22 [facility] How have you been since you were released from the hospital? Patient states he is feeling much better Any questions or concerns? No  Items Reviewed: Did the pt receive and understand the discharge instructions provided? Yes  Medications obtained and verified? Yes  Other? No  Any new allergies since your discharge? No  Dietary orders reviewed? Yes Do you have support at home? Yes   Home Care and Equipment/Supplies: Were home health services ordered? no If so, what is the name of the agency? N/A   Has the agency set up a time to come to the patient's home? not applicable Were any new equipment or medical supplies ordered?  No What is the name of the medical supply agency? N/A Were you able to get the supplies/equipment? not applicable Do you have any questions related to the use of the equipment or supplies? No  Functional Questionnaire: (I = Independent and D = Dependent) ADLs: I  Bathing/Dressing- I  Meal Prep- I  Eating- I  Maintaining continence- I  Transferring/Ambulation- I  Managing Meds- I  Follow up appointments reviewed:  PCP Hospital f/u appt confirmed? Yes  Scheduled to see Dr. Silvio Pate on 02/07/22 @ 1600. [facility] f/u appt confirmed? No   Are transportation arrangements needed? No  If their condition worsens, is the pt aware to call PCP or go to the Emergency Dept.? Yes Was the patient provided with contact information for the PCP's office or ED? Yes Was to pt encouraged to call back with questions or concerns? Yes  SDOH assessments and interventions completed:   Yes  Care Coordination Interventions Activated:  No   Care Coordination Interventions:   N/A     Encounter Outcome:  Pt. Visit Completed    Emelia Loron RN, BSN [place] [phone] [email or web address]

## 2022-02-06 ENCOUNTER — Telehealth: Payer: Self-pay

## 2022-02-06 NOTE — Progress Notes (Signed)
Chronic Care Management Pharmacy Assistant   Name: Devin White  MRN: [id number] DOB: 03/12/1956  Reason for Encounter: CCM (Appointment Reminder)   Medications: Outpatient Encounter Medications as of 02/06/2022  Medication Sig   acetaminophen (TYLENOL) 325 MG tablet Take 650 mg by mouth every 6 (six) hours as needed for mild pain.   amLODipine (NORVASC) 5 MG tablet Take 1/2 tablet (2.5 mg total) by mouth daily.   ascorbic acid (VITAMIN C) 500 MG tablet Take 1 tablet (500 mg total) by mouth daily.   aspirin 325 MG tablet Take 1 tablet (325 mg total) by mouth daily.   calcium carbonate (TUMS) 500 MG chewable tablet Chew 1 tablet (200 mg of elemental calcium total) by mouth 2 (two) times daily as needed for indigestion or heartburn.   cefadroxil (DURICEF) 500 MG capsule Take 2 capsules (1,000 mg total) by mouth 2 (two) times daily for 7 days.   colchicine 0.6 MG tablet Take 1 tablet (0.6 mg total) by mouth daily as needed. (Patient taking differently: Take 0.6 mg by mouth every other day.)   diclofenac Sodium (VOLTAREN) 1 % GEL Apply 4 g topically 4 (four) times daily. (Patient taking differently: Apply 4 g topically 2 (two) times daily as needed (pain).)   docusate sodium (COLACE) 100 MG capsule Take 1 capsule (100 mg total) by mouth daily for 7 days.   furosemide (LASIX) 40 MG tablet Take 1 tablet (40 mg total) by mouth daily.   lidocaine (LIDODERM) 5 % Place 1 patch onto the skin daily for 14 days. Remove & Discard patch within 12 hours or as directed by MD   lisinopril (ZESTRIL) 5 MG tablet Take 1/2 tablet (2.5 mg total) by mouth daily.   Multiple Vitamin (MULTIVITAMIN WITH MINERALS) TABS tablet Take 1 tablet by mouth daily.   mycophenolate (CELLCEPT) 250 MG capsule Take 250-500 mg by mouth 2 (two) times daily. 2 capsules (500 mg) in the morning and one capsule (250 mg) in the evening   omeprazole (PRILOSEC) 20 MG capsule TAKE 1 CAPSULE BY MOUTH TWICE DAILY (Patient taking  differently: daily.)   predniSONE (DELTASONE) 10 MG tablet Take 10 mg by mouth daily with breakfast.   rosuvastatin (CRESTOR) 5 MG tablet Take 1 tablet (5 mg total) by mouth every evening.   senna (SENOKOT) 8.6 MG TABS tablet Take 2 tablets (17.2 mg total) by mouth daily.   SUMAtriptan (IMITREX) 100 MG tablet Take 1 tablet (100 mg total) by mouth once as needed for migraine (may repeat x 1 in 24 hrs not more than 2 days per week. total 9 tablets). May repeat in 2 hours if headache persists or recurs.   tacrolimus (PROGRAF) 0.5 MG capsule Take 0.5-1 mg by mouth See admin instructions. Taking 0.5 mg in the morning and 1 mg at bedtime   tamsulosin (FLOMAX) 0.4 MG CAPS capsule TAKE 1 CAPSULE(0.4 MG) BY MOUTH DAILY (Patient taking differently: Take 0.4 mg by mouth daily.)   topiramate (TOPAMAX) 50 MG tablet Take 1 tablet (50 mg total) by mouth 2 (two) times daily.   vitamin A 3 MG (10000 UNITS) capsule Take 1 capsule (10,000 Units total) by mouth daily.   zinc sulfate 220 (50 Zn) MG capsule Take 1 capsule (220 mg total) by mouth daily.   No facility-administered encounter medications on file as of 02/06/2022.   [name] was contacted to remind of upcoming telephone visit with Charlene Brooke on 02/11/2022 at 2:15. Patient was reminded to have any  blood glucose and blood pressure readings available for review at appointment.   Patient confirmed appointment.  Are you having any problems with your medications? No   Do you have any concerns you like to discuss with the pharmacist? No  CCM referral has been placed prior to visit?  No   Star Rating Drugs: Medication:  Last Fill: Day Supply Lisinopril 5 mg  12/31/21 60 Rosuvastatin 5 mg 11/06/21 [place], CPP notified  Marijean Niemann, [place] Pharmacy Assistant [phone]

## 2022-02-07 ENCOUNTER — Encounter: Payer: Self-pay | Admitting: Internal Medicine

## 2022-02-07 ENCOUNTER — Telehealth (INDEPENDENT_AMBULATORY_CARE_PROVIDER_SITE_OTHER): Payer: Medicare Other | Admitting: Internal Medicine

## 2022-02-07 DIAGNOSIS — N390 Urinary tract infection, site not specified: Secondary | ICD-10-CM

## 2022-02-07 MED ORDER — TRIMETHOPRIM 100 MG PO TABS
100.0000 mg | ORAL_TABLET | Freq: Every day | ORAL | 5 refills | Status: DC
Start: 2022-02-07 — End: 2022-07-22

## 2022-02-07 NOTE — Progress Notes (Signed)
Subjective:    Patient ID: Devin White, male    DOB: 12-Aug-1955, 66 y.o.   MRN: [id number]  HPI Video virtual visit for hospital follow up Identification done Reviewed limitations and billing and he gave consent Participants---patient in his home and I am in my office  Has had multiple admissions for UTI Often seems septic but blood cultures negative Had been treated with septra not that long before this Urine culture grew 40,000 E coli---resistant to ampicillin and intermediate to amp/sulbactam  Sent home on cefadroxil 500 bid for 7 days  Feels better now No dysuria or hematuria now  Current Outpatient Medications on File Prior to Visit  Medication Sig Dispense Refill   acetaminophen (TYLENOL) 325 MG tablet Take 650 mg by mouth every 6 (six) hours as needed for mild pain.     amLODipine (NORVASC) 5 MG tablet Take 1/2 tablet (2.5 mg total) by mouth daily. 30 tablet 2   ascorbic acid (VITAMIN C) 500 MG tablet Take 1 tablet (500 mg total) by mouth daily.     aspirin 325 MG tablet Take 1 tablet (325 mg total) by mouth daily. 30 tablet 3   calcium carbonate (TUMS) 500 MG chewable tablet Chew 1 tablet (200 mg of elemental calcium total) by mouth 2 (two) times daily as needed for indigestion or heartburn. 180 tablet 1   cefadroxil (DURICEF) 500 MG capsule Take 2 capsules (1,000 mg total) by mouth 2 (two) times daily for 7 days. 28 capsule 0   colchicine 0.6 MG tablet Take 1 tablet (0.6 mg total) by mouth daily as needed. (Patient taking differently: Take 0.6 mg by mouth every other day.) 90 tablet 3   diclofenac Sodium (VOLTAREN) 1 % GEL Apply 4 g topically 4 (four) times daily. (Patient taking differently: Apply 4 g topically 2 (two) times daily as needed (pain).) 100 g 5   docusate sodium (COLACE) 100 MG capsule Take 1 capsule (100 mg total) by mouth daily for 7 days. 7 capsule 0   furosemide (LASIX) 40 MG tablet Take 1 tablet (40 mg total) by mouth daily. 90 tablet 3   lidocaine  (LIDODERM) 5 % Place 1 patch onto the skin daily for 14 days. Remove & Discard patch within 12 hours or as directed by MD 14 patch 0   lisinopril (ZESTRIL) 5 MG tablet Take 1/2 tablet (2.5 mg total) by mouth daily. 30 tablet 2   Multiple Vitamin (MULTIVITAMIN WITH MINERALS) TABS tablet Take 1 tablet by mouth daily.     mycophenolate (CELLCEPT) 250 MG capsule Take 250-500 mg by mouth 2 (two) times daily. 2 capsules (500 mg) in the morning and one capsule (250 mg) in the evening     omeprazole (PRILOSEC) 20 MG capsule TAKE 1 CAPSULE BY MOUTH TWICE DAILY (Patient taking differently: daily.) 180 capsule 3   predniSONE (DELTASONE) 10 MG tablet Take 10 mg by mouth daily with breakfast.     rosuvastatin (CRESTOR) 5 MG tablet Take 1 tablet (5 mg total) by mouth every evening. 30 tablet 0   senna (SENOKOT) 8.6 MG TABS tablet Take 2 tablets (17.2 mg total) by mouth daily. 120 tablet 0   SUMAtriptan (IMITREX) 100 MG tablet Take 1 tablet (100 mg total) by mouth once as needed for migraine (may repeat x 1 in 24 hrs not more than 2 days per week. total 9 tablets). May repeat in 2 hours if headache persists or recurs. 9 tablet 0   tacrolimus (PROGRAF) 0.5 MG capsule  Take 0.5-1 mg by mouth See admin instructions. Taking 0.5 mg in the morning and 1 mg at bedtime     tamsulosin (FLOMAX) 0.4 MG CAPS capsule TAKE 1 CAPSULE(0.4 MG) BY MOUTH DAILY (Patient taking differently: Take 0.4 mg by mouth daily.) 90 capsule 3   topiramate (TOPAMAX) 50 MG tablet Take 1 tablet (50 mg total) by mouth 2 (two) times daily. 60 tablet 1   vitamin A 3 MG (10000 UNITS) capsule Take 1 capsule (10,000 Units total) by mouth daily. 30 capsule    zinc sulfate 220 (50 Zn) MG capsule Take 1 capsule (220 mg total) by mouth daily.     No current facility-administered medications on file prior to visit.    No Known Allergies  Past Medical History:  Diagnosis Date   Arthritis    back and legs   Benign prostatic hypertrophy    GERD  (gastroesophageal reflux disease)    Gout    History of blood transfusion    pt has antibodies in his blood since previous transfusions   History of cirrhosis of liver S/P TRANSPLANT 2009   History of liver failure S/P TRANSPLANT   Hypertension    Left ureteral calculus     Past Surgical History:  Procedure Laterality Date   APPENDECTOMY     bone morrow biopsy     CHOLECYSTECTOMY  2007   CYSTO/ LEFT RETROGRADE PYELOGRAM/ LEFT URETERAL STENT PLACEMENT  03-05-2012  DR Tresa [facility])   LEFT URETERAL CALCULI   CYSTOSCOPY W/ URETERAL STENT PLACEMENT  03/11/2012   Procedure: CYSTOSCOPY WITH STENT REPLACEMENT;  Surgeon: Alexis Frock, MD;  Location: [facility];  Service: Urology;  Laterality: Left;   HERNIA REPAIR  2008   LIVER BIOPSY     LIVER TRANSPLANT  11/24/2007   pt states doing well since liver transplant   LUMBAR DISC SURGERY     LUMBAR FUSION     removal of fistula     URETEROSCOPY  03/11/2012   Procedure: URETEROSCOPY;  Surgeon: Alexis Frock, MD;  Location[facility];  Service: Urology;  Laterality: Left;   STONE MANIPULATION, stone obtained  [facility]    Family History  Problem Relation Age of Onset   Cancer Mother        colon   Arthritis Mother    Vasculitis Mother    Hypertension Mother    Cancer Father        colon   Kidney disease Father    Arthritis Father    Arthritis Brother    Alcohol abuse Maternal Aunt    Diabetes Maternal Aunt    Alcohol abuse Maternal Uncle    Diabetes Paternal Aunt    Alcohol abuse Maternal Uncle    Alcohol abuse Maternal Uncle     Social History   Socioeconomic History   Marital status: Single    Spouse name: Not on file   Number of children: 3   Years of education: Not on file   Highest education level: Not on file  Occupational History   Occupation: disabled    Employer: RETIRED  Tobacco Use   Smoking status: Never    Passive exposure: Past   Smokeless tobacco: Never  Vaping  Use   Vaping Use: Not on file  Substance and Sexual Activity   Alcohol use: No   Drug use: No   Sexual activity: Not on file  Other Topics Concern   Not on file  Social History Narrative  Has living will   Requests daughter [name] as his health care POA   Would accept brief attempt at resuscitation but no prolonged ventilation   No tube feeds if cognitively unaware   Social Determinants of Health   Financial Resource Strain: Low Risk  (07/04/2021)   Overall Financial Resource Strain (CARDIA)    Difficulty of Paying Living Expenses: Not very hard  Food Insecurity: Not on file  Transportation Needs: Not on file  Physical Activity: Not on file  Stress: Not on file  Social Connections: Not on file  Intimate Partner Violence: Not on file   Review of Systems No N/V now Eating better again    Objective:   Physical Exam Constitutional:      Appearance: Normal appearance.  Pulmonary:     Effort: Pulmonary effort is normal. No respiratory distress.  Neurological:     Mental Status: He is alert.            Assessment & Plan:

## 2022-02-07 NOTE — Assessment & Plan Note (Signed)
No explanation for severe recurrent infections No obstruction No clear retention Needs to go back to the urologist Discussed trying daily prophylaxis--will try trimethoprim 100 daily

## 2022-02-11 ENCOUNTER — Ambulatory Visit: Payer: Medicare Other | Admitting: Pharmacist

## 2022-02-11 DIAGNOSIS — N390 Urinary tract infection, site not specified: Secondary | ICD-10-CM

## 2022-02-11 DIAGNOSIS — I5032 Chronic diastolic (congestive) heart failure: Secondary | ICD-10-CM

## 2022-02-11 DIAGNOSIS — Z944 Liver transplant status: Secondary | ICD-10-CM

## 2022-02-11 DIAGNOSIS — M1A9XX1 Chronic gout, unspecified, with tophus (tophi): Secondary | ICD-10-CM

## 2022-02-11 DIAGNOSIS — N1831 Chronic kidney disease, stage 3a: Secondary | ICD-10-CM

## 2022-02-11 DIAGNOSIS — I10 Essential (primary) hypertension: Secondary | ICD-10-CM

## 2022-02-11 NOTE — Progress Notes (Unsigned)
Chronic Care Management Pharmacy Note  02/14/2022 Name:  LINWOOD GULLIKSON MRN:  [id number] DOB:  November 24, 1955  Summary: CCM F/U visit -Pt reports feeling relatively well since most recent admission for urosepsis. He just started trimethoprim prophylaxis, tolerating so far -Reviewed medications: pt is taking whole tablets of amlodipine and lisinopril (prescribed 1/2 tablet) and BP has been stable. Annotated med list. -He is also out of topiramate and unsure if he needs to continue this - per chart this was prescribed on trial basis 10/2021 during hospitalization for presumed complicated migraine (vs TIA).  -For future when pt is more stable - hospital notes have noted hx of sleep apnea, it does not appear pt ever had sleep study done. He saw pulmonology 11/2015 and they had recommended sleep study but looks like pt was lost to follow up. OP sleep study was also recommended during 05/2021 hospitalization due to high-risk STOP-BANG  Recommendations/Changes made from today's visit: -Recommend to hold off on restarting topiramate - can consider re-trial in future if migraine symptoms occur -Consider referral for sleep study to evaluate sleep apnea in future  Plan: -Pharmacist follow up televisit scheduled for 2 months -PCP CPE 05/08/22    Subjective: KEVORK JOYCE is an 66 y.o. year old male who is a primary patient of Venia Carbon, MD.  The CCM team was consulted for assistance with disease management and care coordination needs.    Engaged with patient by telephone for follow up visit in response to provider referral for pharmacy case management and/or care coordination services.   Consent to Services:  The patient was given information about Chronic Care Management services, agreed to services, and gave verbal consent prior to initiation of services.  Please see initial visit note for detailed documentation.   Patient Care Team: Venia Carbon, MD as PCP - General Lenord Fellers  Cleaster Corin, [facility] as Pharmacist (Pharmacist)   Recent office visits: 02/07/22 Dr Silvio Pate VV: hospital f/u - try trimethoprim 100 mg daily for ppx. Advise f/u with urology. 11/14/21 Dr Silvio Pate VV: hospital f/u - outpatient f/u with Dr Leonie Man. 10/12/21 Dr Silvio Pate OV: hospital f/u - reduce amlodipine back to 5 mg. D/c allopurinol (on infusions at [facility] for gout) 09/05/21 Dr Silvio Pate OV: hospital f/u - restart lisinopril at 5 mg daily.  05/02/21 Dr Silvio Pate OV: AWV and hospital f/u - pt did not start carvedilol, amlodipine or lisinopril. Advised to start lisinopril for BP and kidneys.  12/12/20 Venia Carbon, MD (PCP): Chronic tophaceous gout . D/C hydralazine, lisinopril, oxycodone (pt no longer taking)  Recent consult visits: 01/09/22 Dr Loletha Grayer ([facility]): no changes 09/06/21 Dr Crisoforo Oxford ([facility]): no changes 08/02/21 Dr Zigmund Daniel [facility]): add carvedilol 3.125 mg BID 07/05/21 Dr Zigmund Daniel [facility] Card[facility]): start amlodipine 5 mg. 07/04/21 Dr Loletha Grayer ([facility]): no changes  06/04/21 Dr Dionne Milo [facility] Hematology): f/u VTE; stop Eliquis; f/u PRN. Go to ED for elevated WBC and elbow 05/24/21 Dr Crisoforo Oxford (rheumatology): f/u gout; current flare; he has flares twice a month; Restart colchicine 0.6 mg QOD; given Depo-Medrol for flare; Continue allopurinol until Krystexxa started 05/14/21 PA Josiah Lobo [facility]ematology): f/u mesenteric vein thrombosis. Pt became SOB, diaphoretic, emetic in office. Advised to go to ED. 04/01/21 Dr Amalia Hailey [facility]ph[facility]): new consult -f/u AKI in ED. labs today; f/u 4 months. 02/22/21 Dr Crisoforo Oxford (rheumatology): f/u gout. Increase allopurinol to 300 mg. Rx'd prednisone taper 60 mg. Advised not to increase colchicine during flare. 02/22/21 Dr Royanne Foots (dermatology): skin check, benign. 01/01/21  Dr Loletha Grayer ([facility])- f/u Liver transplant. Maintain prednisone w/o taper in light of recent medical problems. Referred to dermatology. Refer to nephrology. Advised covid and  shingrix vaccines. 12/29/20 PA Linton Ham [facility] Hematology) - f/u Mesenteric vein thrombosis. VTE provoked in s/o appendectomy surgery. Plan 3 months of Eliquis.  Hospital visits: 01/29/22 - 02/02/22 Admission [facility]): complicated UTI 0/97/35 - 12/31/21 Admission ([facility]): severe sepsis 10/26/21 - 11/07/21 Admission [facility]): Cdiff. Completed oral vanc. TIA - started aspirin 325. Trial topamax and imitrex.  10/19/21 - 10/24/21 Admission ([facility]): acute gastroenteritis.  09/29/21 - 10/06/21 Admission ([facility]): chronic gout. Given 3 days of anakinra injections. Increased amlodipine to 10 mg. 09/14/21 ED visit [facility]): chest pain, SOB x weeks. R/o ACS. No concerning findings.  08/09/21 - 08/16/21 Admission ([facility]): fever, N/V, severe gout. Received Anakinra, discharged on prednisone and colchicine.  Admitted to the hospital on 06/04/21 due to Septic bursitis. Discharge date was 06/08/21. Discharged from [facility].   -treated with vanc/cefepime, new exertional dyspnea concerning for HFpEF vs stable angina -Rec OP stress test, referred to cardiology -Consider OP sleep study given high risk STOP-BANG -Pt considering to change PCP to [place] system for ease  Admitted to the ED on 05/14/21 due to SOB, diaphoresis. Discharge date was 05/15/21. Discharged from [place], UA, urine culture, blood cultures ordered. Discharged home.  Admitted to the hospital on 04/26/21 due to Elevated LFTs, N/V, facial swelling. Discharge date was 05/01/21. Discharged from [facility].   -LFTs/parotid enlargement likely 2/2 viral infection vs ischemic hepatitis from dehydration  Admitted to the ED on 04/07/21 due to Diarrhea, paresthesias. Discharge date was 04/07/21. Discharged from [facility].   -CT head/neck, MRI brain normal. -RUQ [place] normal. CXR normal. -Dehyrated on exam. Given IVF, zofran.   Objective:  Lab Results  Component Value Date   CREATININE 1.38 (H) 02/01/2022   BUN 18 02/01/2022   GFR 50.65 (L) 12/12/2020    GFRNONAA 57 (L) 02/01/2022   GFRAA 48 (L) 08/11/2019   NA 142 02/01/2022   K 3.2 (L) 02/01/2022   CALCIUM 8.9 02/01/2022   CO2 23 02/01/2022   GLUCOSE 142 (H) 02/01/2022    Lab Results  Component Value Date/Time   HGBA1C 5.1 01/30/2022 12:49 AM   HGBA1C 6.2 (H) 10/26/2021 01:50 PM   GFR 50.65 (L) 12/12/2020 08:56 AM   GFR 48.07 (L) 04/15/2016 03:51 PM    Last diabetic Eye exam: No results found for: "HMDIABEYEEXA"  Last diabetic Foot exam: No results found for: "HMDIABFOOTEX"   Lab Results  Component Value Date   CHOL 158 05/22/2012   HDL 34.10 (L) 05/22/2012   LDLDIRECT 69.4 11/05/2021   TRIG 284.0 (H) 05/22/2012   CHOLHDL 5 05/22/2012       Latest Ref Rng & Units 01/29/2022    4:41 PM 12/31/2021    3:49 AM 12/30/2021    3:40 AM  Hepatic Function  Total Protein 6.5 - 8.1 g/dL 7.0  5.8  5.8   Albumin 3.5 - 5.0 g/dL 4.1  3.3  3.1   AST 15 - 41 U/L _0 ALT 0 - 44 U/L 34  32  30   Alk Phosphatase 38 - 126 U/L 51  43  44   Total Bilirubin 0.3 - 1.2 mg/dL 1.3  0.6  0.3     Lab Results  Component Value Date/Time   TSH 3.504 01/30/2022 12:49 AM   TSH 3.99 02/22/2015 08:51 AM  TSH 5.70 (H) 08/17/2013 09:25 AM   FREET4 1.01 02/22/2015 08:51 AM   FREET4 1.13 08/17/2013 09:25 AM       Latest Ref Rng & Units 02/01/2022   10:01 AM 01/30/2022   12:18 PM 01/29/2022    4:41 PM  CBC  WBC 4.0 - 10.5 K/uL 7.1  14.7  21.2   Hemoglobin 13.0 - 17.0 g/dL 13.2  12.6  14.3   Hematocrit 39.0 - 52.0 % 39.6  37.4  43.0   Platelets 150 - 400 K/uL 151  138  175     No results found for: "VD25OH"  Clinical ASCVD: No  The ASCVD Risk score (Arnett DK, et al., 2019) failed to calculate for the following reasons:   Cannot find a previous HDL lab   Cannot find a previous total cholesterol lab       05/02/2021   12:49 PM 05/02/2021   11:57 AM 05/01/2020    4:43 PM  Depression screen PHQ 2/9  Decreased Interest 0 0 0  Down, Depressed, Hopeless 0 0 0  PHQ - 2 Score 0 0 0      Social History   Tobacco Use  Smoking Status Never   Passive exposure: Past  Smokeless Tobacco Never   BP Readings from Last 3 Encounters:  02/07/22 136/82  02/02/22 (!) 140/85  01/03/22 140/86   Pulse Readings from Last 3 Encounters:  02/02/22 70  12/31/21 69  11/07/21 86   Wt Readings from Last 3 Encounters:  02/07/22 255 lb (115.7 kg)  02/02/22 248 lb 14.4 oz (112.9 kg)  01/03/22 258 lb (117 kg)   BMI Readings from Last 3 Encounters:  02/07/22 35.57 kg/m  02/02/22 33.76 kg/m  01/03/22 35.98 kg/m    Assessment/Interventions: Review of patient past medical history, allergies, medications, health status, including review of consultants reports, laboratory and other test data, was performed as part of comprehensive evaluation and provision of chronic care management services.   SDOH:  (Social Determinants of Health) assessments and interventions performed: Yes   SDOH Screenings   Alcohol Screen: Not on file  Depression (PHQ2-9): Low Risk  (05/02/2021)   Depression (PHQ2-9)    PHQ-2 Score: 0  Financial Resource Strain: Low Risk  (07/04/2021)   Overall Financial Resource Strain (CARDIA)    Difficulty of Paying Living Expenses: Not very hard  Food Insecurity: Not on file  Housing: Not on file  Physical Activity: Not on file  Social Connections: Not on file  Stress: Not on file  Tobacco Use: Low Risk  (02/07/2022)   Patient History    Smoking Tobacco Use: Never    Smokeless Tobacco Use: Never    Passive Exposure: Past  Transportation Needs: Not on file    [place]  No Known Allergies  Medications Reviewed Today     Reviewed by Charlton Haws, [facility] (Pharmacist) on 02/14/22 at 1412  Med List Status: <None>   Medication Order Taking? Sig Documenting Provider Last Dose Status Informant  acetaminophen (TYLENOL) 325 MG tablet [id number] Yes Take 650 mg by mouth every 6 (six) hours as needed for mild pain. [provider] Taking Active  Self  amLODipine (NORVASC) 5 MG tablet [id number] Yes Take 1/2 tablet (2.5 mg total) by mouth daily.  Patient taking differently: Take 5 mg by mouth daily.   Georgette Shell, MD Taking Active   ascorbic acid (VITAMIN C) 500 MG tablet [id number] Yes Take 1 tablet (500 mg total) by mouth daily. Hosie Poisson,  MD Taking Active Self  aspirin 325 MG tablet [id number] Yes Take 1 tablet (325 mg total) by mouth daily. Hosie Poisson, MD Taking Active Self  calcium carbonate (TUMS) 500 MG chewable tablet [id number] Yes Chew 1 tablet (200 mg of elemental calcium total) by mouth 2 (two) times daily as needed for indigestion or heartburn. Tonia Ghent, MD Taking Active Self  colchicine 0.6 MG tablet [id number] Yes Take 1 tablet (0.6 mg total) by mouth daily as needed.  Patient taking differently: Take 0.6 mg by mouth every other day.   Hosie Poisson, MD Taking Active Self  diclofenac Sodium (VOLTAREN) 1 % GEL [id number] Yes Apply 4 g topically 4 (four) times daily.  Patient taking differently: Apply 4 g topically 2 (two) times daily as needed (pain).   Venia Carbon, MD Taking Active Self  furosemide (LASIX) 40 MG tablet [id number] Yes Take 1 tablet (40 mg total) by mouth daily.  Patient taking differently: Take 40 mg by mouth daily as needed.   Venia Carbon, MD Taking Active Self  lidocaine (LIDODERM) 5 % [id number] Yes Place 1 patch onto the skin daily for 14 days. Remove & Discard patch within 12 hours or as directed by MD Nolberto Hanlon, MD Taking Active   lisinopril (ZESTRIL) 5 MG tablet [id number] Yes Take 1/2 tablet (2.5 mg total) by mouth daily.  Patient taking differently: Take 5 mg by mouth daily.   Georgette Shell, MD Taking Active   Multiple Vitamin (MULTIVITAMIN WITH MINERALS) TABS tablet [id number] Yes Take 1 tablet by mouth daily. Hosie Poisson, MD Taking Active Self  mycophenolate (CELLCEPT) 250 MG capsule [id number] Yes Take 250-500 mg by mouth 2 (two) times daily. 2 capsules (500  mg) in the morning and one capsule (250 mg) in the evening [provider] Taking Active Self  omeprazole (PRILOSEC) 20 MG capsule [id number] Yes TAKE 1 CAPSULE BY MOUTH TWICE DAILY  Patient taking differently: daily.   Venia Carbon, MD Taking Active Self  predniSONE (DELTASONE) 10 MG tablet [id number] Yes Take 10 mg by mouth daily with breakfast. [provider] Taking Active   rosuvastatin (CRESTOR) 5 MG tablet [id number] Yes Take 1 tablet (5 mg total) by mouth every evening. Hosie Poisson, MD Taking Active Self  senna (SENOKOT) 8.6 MG TABS tablet [id number] Yes Take 2 tablets (17.2 mg total) by mouth daily. Georgette Shell, MD Taking Active   SUMAtriptan [facility]) 100 MG tablet [id number] Yes Take 1 tablet (100 mg total) by mouth once as needed for migraine (may repeat x 1 in 24 hrs not more than 2 days per week. total 9 tablets). May repeat in 2 hours if headache persists or recurs. Hosie Poisson, MD Taking Active Self  tacrolimus (PROGRAF) 0.5 MG capsule [id number] Yes Take 0.5-1 mg by mouth See admin instructions. Taking 0.5 mg in the morning and 1 mg at bedtime [provider] Taking Active Self  tamsulosin (FLOMAX) 0.4 MG CAPS capsule [id number] Yes TAKE 1 CAPSULE(0.4 MG) BY MOUTH DAILY Venia Carbon, MD Taking Active Self  trimethoprim (TRIMPEX) 100 MG tablet [id number] Yes Take 1 tablet (100 mg total) by mouth daily. To prevent UTI Venia Carbon, MD Taking Active   vitamin A 3 MG (10000 UNITS) capsule [id number] Yes Take 1 capsule (10,000 Units total) by mouth daily. Hosie Poisson, MD Taking Active Self  zinc sulfate 220 (50 Zn) MG capsule [id number] Yes Take 1 capsule (220 mg total) by mouth daily. Hosie Poisson, MD Taking Active Self  Patient Active Problem List   Diagnosis Date Noted   Acute pyelonephritis 01/31/2022   Sepsis ([place]) 02/54/2706   Complicated UTI (urinary tract infection) 01/30/2022   Hypokalemia 12/27/2021   Severe  sepsis ([place]) 12/26/2021   Acute UTI (urinary tract infection) 12/26/2021   Left sided numbness 11/14/2021   Obesity (BMI 30-39.9) 10/27/2021   Diarrhea 10/26/2021   Stage 3a chronic kidney disease (CKD) ([place]) 10/26/2021   Hyperbilirubinemia 10/26/2021   Hyperglycemia 10/26/2021   Chronic diastolic heart failure ([place]) 09/05/2021   Achilles tendon mass 09/25/2020   Joint pain 09/22/2020   Recurrent UTI 03/26/2019   Pulmonary nodule 08/04/2018   Liver transplant status ([place]) 10/10/2017   Advance directive discussed with patient 10/10/2017   Stage 3b chronic kidney disease ([place]) 10/09/2016   Mallory-Weiss tear 04/16/2016   Long-term use of immunosuppressant medication 04/08/2016   Sleep apnea 10/06/2015   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[place]) 04/10/2015   Immunosuppression ([place]) 06/01/2012   Hypertension    Routine general medical examination at a health care facility 04/26/2011   Chronic tophaceous gout 12/21/2008   BPH without urinary obstruction 12/21/2008   ALLERGIC RHINITIS 05/22/2007   GERD 05/22/2007   HIATAL HERNIA 05/22/2007   IRRITABLE BOWEL SYNDROME, HX OF 05/22/2007   RENAL CALCULUS, HX OF 05/22/2007    Immunization History  Administered Date(s) Administered   Fluad Quad(high Dose 65+) 03/26/2019, 05/01/2020, 05/02/2021   Influenza Split 04/15/2011   Influenza Whole 04/17/2010   Influenza, High Dose Seasonal PF 03/30/2018   Influenza, Seasonal, Injecte, Preservative Fre 05/22/2012   Influenza,inj,Quad PF,6+ Mos 08/09/2013, 03/30/2015, 03/22/2016, 04/07/2017   Pneumococcal Conjugate-13 12/01/2014   Pneumococcal Polysaccharide-23 06/01/2012, 10/10/2017   Tdap 05/22/2012    Conditions to be addressed/monitored:  Hypertension, GERD, Chronic Kidney Disease, BPH, and Gout, Liver transplant status  Care Plan : [place]  Updates made by Charlton Haws, [place] since 02/14/2022 12:00 AM     Problem: Hypertension, GERD, Chronic  Kidney Disease, BPH, and Gout, Liver transplant status   Priority: High     Long-Range Goal: Disease mgmt   Start Date: 07/04/2021  Expected End Date: 07/04/2022  This Visit's Progress: On track  Recent Progress: On track  Priority: High  Note:   Current Barriers:  Multiple admissions, specialists and med changes  Pharmacist Clinical Goal(s):  Patient will contact provider office for questions/concerns as evidenced notation of same in electronic health record through collaboration with PharmD and provider.   Interventions: 1:1 collaboration with Venia Carbon, MD regarding development and update of comprehensive plan of care as evidenced by provider attestation and co-signature Inter-disciplinary care team collaboration (see longitudinal plan of care) Comprehensive medication review performed; medication list updated in electronic medical record   Hypertension / CKD Stage 3a (BP goal <130/80) -Controlled- BP at goal recently in clinic visits; of note patient has been taking whole tablets of amlodipine and lisinopril (prescribed 1/2 tablet) and BP has been stable -Hx of high BP in hospital, low BP at home leading to passing out -Current home BP readings: n/a -Current treatment: Lisinopril 5 mg daily - Appropriate, Effective, Safe, Accessible Amlodipine 5 mg daily - Appropriate, Effective, Safe, Accessible Furosemide 40 mg daily PRN - Appropriate, Effective, Safe, Accessible -Medications previously tried: amlodipine, carvedilol -Denies hypotensive/hypertensive symptoms -Educated on BP goals and benefits of medications for prevention of heart attack, stroke and kidney damage; Importance of home blood pressure monitoring -Counseled to monitor BP at home daily, report readings consistently >  140/90 -Recommended to continue current medication; consider referral for sleep study   Query OSA -Hx sleep apnea noted, no record of recent sleep study in chart, pt declined CPAP in  hospitalizations and does not use it at home. Per chart review sleep study was being pursued 11/2015 with pulmonology but looks like patient was lost to follow up. Sleep study was also recommended during 05/2021 hospitalization for sepsis due to high-risk STOP-BANG -Consider referral for sleep study once patient is more stable  Gout (Goal: uric acid <5) -Controlled - pt is still taking allopurinol and colchicine after starting Krystexxa 06/14/21 through [place] -Hx severe gout, multiple hospitalizations related to gout flares -Current treatment  Allopurinol 300 mg daily - Query Appropriate, Effective, Safe, Accessible Colchicine 0.6 mg QOD - Appropriate, Effective, Safe, Accessible Krystexxa infusions q 2 weeks - Appropriate, Effective, Safe, Accessible -Discussed rheumatology notes -previous plan was to stop allopurinol when starting Krystexxa; pt will follow up with rheumatology for further instructions -Recommended to continue current medication   Liver transplant status (Goal: prevent rejections) -Controlled - pt has had multiple hospitalizations recently with mildly elevated AST/ALT and bili, extensive workups and transplant follow up have determined he is now stable; he endorses compliance with below regimen -transplant 2009 for NAFLD, overall stable; follows with [facility] -Current treatment  Tacrolimus 0.5 mg BID - Appropriate, Effective, Safe, Accessible Mycophenolate 250 mg - 2 cap AM and 1 cap PM - Appropriate, Effective, Safe, Accessible Prednisone 5 mg - Appropriate, Effective, Safe, Accessible -Discussed importance of compliance to prevent rejections -Recommended to continue current medication   Query Migraines (Goal: reduce frequency) -Hospitalized 10/2021 with nonspecific neurologic symptoms, concern for TIA for complicated migraine - prescribed trial of topamax and imitrex. -Current treatment  Topiramate 50 mg BID - not taking (Rx ran out early  August) Sumatriptan 100 mg PRN - not used -Medications previously tried: n/a  -Reviewed purpose of medications for possible atypical migraine, pt has not had repeat of TIA-like symptoms that he is aware of  It is reasonable to remain off topiramate unless migraine symptoms recur - will consult with PCP  Recurrent UTI (Goal: prevent UTI -Pt has picked up trimethoprim and started taking it, he has not noticed any side effects/concerns so far -Current treatment  Trimethoprim 100 mg daily - Appropriate, Effective, Safe, Accessible -Medications previously tried: n/a -Reviewed benefits of trimethoprim for UTI prophylaxis  -Recommended to continue current medication  BPH (Goal: manage symptoms) -Controlled - per pt report -Current treatment  Tamsulosin 0.4 mg daily - Appropriate, Effective, Safe, Accessible -Recommended to continue current medication   GERD (Goal: manage symptoms) -Controlled - per pt report -Current treatment  Omeprazole 20 mg BID - Appropriate, Effective, Safe, Accessible Tums PRN - Appropriate, Effective, Safe, Accessible -Recommended to continue current medication   Hx of Mesenteric vein thrombosis (Treatment completed) -Likely provoked after appendectomy 11/2020. Initial plans was for 3 months of Eliquis, pt missed appt in Sept 2022 and was admitted 04/2021; Eliquis stopped Dec 2022 -Discussed s/sx of clots; continue to monitor   Health Maintenance -Vaccine gaps: none   Patient Goals/Self-Care Activities Patient will:  - take medications as prescribed as evidenced by patient report and record review focus on medication adherence by pill box check blood pressure daily, document, and provide at future appointments      Medication Assistance: None required.  Patient affirms current coverage meets needs.  Compliance/Adherence/Medication fill history: Care Gaps: None  Star-Rating Drugs: Lisinopril - LF 06/08/21 x 30 ds; [place]  85%  Medication Access: Within  the past 30 days, how often has patient missed a dose of medication? 0 Is a pillbox or other method used to improve adherence? Yes  Factors that may affect medication adherence? no barriers identified Are meds synced by current pharmacy? No  Are meds delivered by current pharmacy? No  Does patient experience delays in picking up medications due to transportation concerns? No   [facility] Services Reviewed: Is patient disadvantaged to use [facility]?: No  Current Rx insurance plan: [facility] [place] Name and location of Current pharmacy:  [facility] [place], [place] [address] Phone: [phone] Fax: [phone]  [facility] services reviewed with patient today?: No  Patient requests to transfer care to [facility]?: No  Reason patient declined to change pharmacies: Not mentioned at this visit   Care Plan and Follow Up Patient Decision:  Patient agrees to Care Plan and Follow-up.  Plan: Telephone follow up appointment with care management team member scheduled for:  2 months  Charlene Brooke, PharmD, [facility] Clinical Pharmacist [facility] [phone]

## 2022-02-14 NOTE — Patient Instructions (Signed)
Visit Information  Phone number for Pharmacist: [phone]   Goals Addressed   None     Care Plan : [place] [facility]  Updates made by Charlton Haws, RPH since 02/14/2022 12:00 AM     Problem: Hypertension, GERD, Chronic Kidney Disease, BPH, and Gout, Liver transplant status   Priority: High     Long-Range Goal: Disease mgmt   Start Date: 07/04/2021  Expected End Date: 07/04/2022  This Visit's Progress: On track  Recent Progress: On track  Priority: High  Note:   Current Barriers:  Multiple admissions, specialists and med changes  Pharmacist Clinical Goal(s):  Patient will contact provider office for questions/concerns as evidenced notation of same in electronic health record through collaboration with PharmD and provider.   Interventions: 1:1 collaboration with Venia Carbon, MD regarding development and update of comprehensive plan of care as evidenced by provider attestation and co-signature Inter-disciplinary care team collaboration (see longitudinal plan of care) Comprehensive medication review performed; medication list updated in electronic medical record   Hypertension / CKD Stage 3a (BP goal <130/80) -Controlled- BP at goal recently in clinic visits; of note patient has been taking whole tablets of amlodipine and lisinopril (prescribed 1/2 tablet) and BP has been stable -Hx of high BP in hospital, low BP at home leading to passing out -Current home BP readings: n/a -Current treatment: Lisinopril 5 mg daily - Appropriate, Effective, Safe, Accessible Amlodipine 5 mg daily - Appropriate, Effective, Safe, Accessible Furosemide 40 mg daily PRN - Appropriate, Effective, Safe, Accessible -Medications previously tried: amlodipine, carvedilol -Denies hypotensive/hypertensive symptoms -Educated on BP goals and benefits of medications for prevention of heart attack, stroke and kidney damage; Importance of home blood pressure monitoring -Counseled to monitor  BP at home daily, report readings consistently > 140/90 -Recommended to continue current medication; consider referral for sleep study   Query OSA -Hx sleep apnea noted, no record of recent sleep study in chart, pt declined CPAP in hospitalizations and does not use it at home. Per chart review sleep study was being pursued 11/2015 with pulmonology but looks like patient was lost to follow up. Sleep study was also recommended during 05/2021 hospitalization for sepsis due to high-risk STOP-BANG -Consider referral for sleep study once patient is more stable  Gout (Goal: uric acid <5) -Controlled - pt is still taking allopurinol and colchicine after starting Krystexxa 06/14/21 through [facility] -Hx severe gout, multiple hospitalizations related to gout flares -Current treatment  Allopurinol 300 mg daily - Query Appropriate, Effective, Safe, Accessible Colchicine 0.6 mg QOD - Appropriate, Effective, Safe, Accessible Krystexxa infusions q 2 weeks - Appropriate, Effective, Safe, Accessible -Discussed rheumatology notes -previous plan was to stop allopurinol when starting Krystexxa; pt will follow up with rheumatology for further instructions -Recommended to continue current medication   Liver transplant status (Goal: prevent rejections) -Controlled - pt has had multiple hospitalizations recently with mildly elevated AST/ALT and bili, extensive workups and transplant follow up have determined he is now stable; he endorses compliance with below regimen -transplant 2009 for NAFLD, overall stable; follows with [facility] -Current treatment  Tacrolimus 0.5 mg BID - Appropriate, Effective, Safe, Accessible Mycophenolate 250 mg - 2 cap AM and 1 cap PM - Appropriate, Effective, Safe, Accessible Prednisone 5 mg - Appropriate, Effective, Safe, Accessible -Discussed importance of compliance to prevent rejections -Recommended to continue current medication   Query Migraines (Goal: reduce  frequency) -Hospitalized 10/2021 with nonspecific neurologic symptoms, concern for TIA for complicated migraine - prescribed trial of  topamax and imitrex. -Current treatment  Topiramate 50 mg BID - not taking (Rx ran out early August) Sumatriptan 100 mg PRN - not used -Medications previously tried: n/a  -Reviewed purpose of medications for possible atypical migraine, pt has not had repeat of TIA-like symptoms that he is aware of  It is reasonable to remain off topiramate unless migraine symptoms recur - will consult with PCP  Recurrent UTI (Goal: prevent UTI -Pt has picked up trimethoprim and started taking it, he has not noticed any side effects/concerns so far -Current treatment  Trimethoprim 100 mg daily - Appropriate, Effective, Safe, Accessible -Medications previously tried: n/a -Reviewed benefits of trimethoprim for UTI prophylaxis  -Recommended to continue current medication  BPH (Goal: manage symptoms) -Controlled - per pt report -Current treatment  Tamsulosin 0.4 mg daily - Appropriate, Effective, Safe, Accessible -Recommended to continue current medication   GERD (Goal: manage symptoms) -Controlled - per pt report -Current treatment  Omeprazole 20 mg BID - Appropriate, Effective, Safe, Accessible Tums PRN - Appropriate, Effective, Safe, Accessible -Recommended to continue current medication   Hx of Mesenteric vein thrombosis (Treatment completed) -Likely provoked after appendectomy 11/2020. Initial plans was for 3 months of Eliquis, pt missed appt in Sept 2022 and was admitted 04/2021; Eliquis stopped Dec 2022 -Discussed s/sx of clots; continue to monitor   Health Maintenance -Vaccine gaps: none   Patient Goals/Self-Care Activities Patient will:  - take medications as prescribed as evidenced by patient report and record review focus on medication adherence by pill box check blood pressure daily, document, and provide at future appointments       Patient verbalizes  understanding of instructions and care plan provided today and agrees to view in [facility]. Active MyChart status and patient understanding of how to access instructions and care plan via MyChart confirmed with patient.    Telephone follow up appointment with pharmacy team member scheduled for: 2 months  Charlene Brooke, PharmD, [facility] Clinical Pharmacist [facility] [phone]

## 2022-02-16 ENCOUNTER — Other Ambulatory Visit: Payer: Self-pay | Admitting: Internal Medicine

## 2022-03-15 ENCOUNTER — Ambulatory Visit (INDEPENDENT_AMBULATORY_CARE_PROVIDER_SITE_OTHER): Payer: Medicare Other

## 2022-03-15 DIAGNOSIS — Z23 Encounter for immunization: Secondary | ICD-10-CM

## 2022-04-08 ENCOUNTER — Telehealth: Payer: Self-pay

## 2022-04-08 DIAGNOSIS — Z0100 Encounter for examination of eyes and vision without abnormal findings: Secondary | ICD-10-CM | POA: Diagnosis not present

## 2022-04-08 DIAGNOSIS — H2512 Age-related nuclear cataract, left eye: Secondary | ICD-10-CM | POA: Diagnosis not present

## 2022-04-08 DIAGNOSIS — H2513 Age-related nuclear cataract, bilateral: Secondary | ICD-10-CM | POA: Diagnosis not present

## 2022-04-08 DIAGNOSIS — H2511 Age-related nuclear cataract, right eye: Secondary | ICD-10-CM | POA: Diagnosis not present

## 2022-04-08 NOTE — Progress Notes (Signed)
Chronic Care Management Pharmacy Assistant   Name: Devin White  MRN: [id number] DOB: Feb 25, 1956  Reason for Encounter: CCM (Appointment Reminder)  Medications: Outpatient Encounter Medications as of 04/08/2022  Medication Sig   acetaminophen (TYLENOL) 325 MG tablet Take 650 mg by mouth every 6 (six) hours as needed for mild pain.   amLODipine (NORVASC) 5 MG tablet Take 1/2 tablet (2.5 mg total) by mouth daily. (Patient taking differently: Take 5 mg by mouth daily.)   ascorbic acid (VITAMIN C) 500 MG tablet Take 1 tablet (500 mg total) by mouth daily.   aspirin 325 MG tablet Take 1 tablet (325 mg total) by mouth daily.   calcium carbonate (TUMS) 500 MG chewable tablet Chew 1 tablet (200 mg of elemental calcium total) by mouth 2 (two) times daily as needed for indigestion or heartburn.   colchicine 0.6 MG tablet Take 1 tablet (0.6 mg total) by mouth daily as needed. (Patient taking differently: Take 0.6 mg by mouth every other day.)   diclofenac Sodium (VOLTAREN) 1 % GEL Apply 4 g topically 4 (four) times daily. (Patient taking differently: Apply 4 g topically 2 (two) times daily as needed (pain).)   furosemide (LASIX) 40 MG tablet Take 1 tablet (40 mg total) by mouth daily. (Patient taking differently: Take 40 mg by mouth daily as needed.)   lisinopril (ZESTRIL) 5 MG tablet Take 1/2 tablet (2.5 mg total) by mouth daily. (Patient taking differently: Take 5 mg by mouth daily.)   Multiple Vitamin (MULTIVITAMIN WITH MINERALS) TABS tablet Take 1 tablet by mouth daily.   mycophenolate (CELLCEPT) 250 MG capsule Take 250-500 mg by mouth 2 (two) times daily. 2 capsules (500 mg) in the morning and one capsule (250 mg) in the evening   omeprazole (PRILOSEC) 20 MG capsule Take 1 capsule (20 mg total) by mouth daily.   predniSONE (DELTASONE) 10 MG tablet Take 10 mg by mouth daily with breakfast.   rosuvastatin (CRESTOR) 5 MG tablet Take 1 tablet (5 mg total) by mouth every evening.   senna  (SENOKOT) 8.6 MG TABS tablet Take 2 tablets (17.2 mg total) by mouth daily.   SUMAtriptan (IMITREX) 100 MG tablet Take 1 tablet (100 mg total) by mouth once as needed for migraine (may repeat x 1 in 24 hrs not more than 2 days per week. total 9 tablets). May repeat in 2 hours if headache persists or recurs.   tacrolimus (PROGRAF) 0.5 MG capsule Take 0.5-1 mg by mouth See admin instructions. Taking 0.5 mg in the morning and 1 mg at bedtime   tamsulosin (FLOMAX) 0.4 MG CAPS capsule TAKE 1 CAPSULE(0.4 MG) BY MOUTH DAILY   trimethoprim (TRIMPEX) 100 MG tablet Take 1 tablet (100 mg total) by mouth daily. To prevent UTI   vitamin A 3 MG (10000 UNITS) capsule Take 1 capsule (10,000 Units total) by mouth daily.   zinc sulfate 220 (50 Zn) MG capsule Take 1 capsule (220 mg total) by mouth daily.   No facility-administered encounter medications on file as of 04/08/2022.   Devin White was contacted to remind of upcoming telephone visit with Devin White on 04/11/2022 at 3:45. Patient was reminded to have any blood glucose and blood pressure readings available for review at appointment.   Message was left reminding patient of appointment.  CCM referral has been placed prior to visit?  No   Star Rating Drugs: Medication:  Last Fill: Day Supply Lisinopril 5 mg  12/31/21 60 Verified with [facility] Rosuvastatin  5 mg 11/08/21 30 Verified with [facility]  Devin White, CPP notified  Devin White, [place] Clinical Pharmacy Assistant [phone]

## 2022-04-10 DIAGNOSIS — Z79899 Other long term (current) drug therapy: Secondary | ICD-10-CM | POA: Diagnosis not present

## 2022-04-10 DIAGNOSIS — D849 Immunodeficiency, unspecified: Secondary | ICD-10-CM | POA: Diagnosis not present

## 2022-04-10 DIAGNOSIS — Z79624 Long term (current) use of inhibitors of nucleotide synthesis: Secondary | ICD-10-CM | POA: Diagnosis not present

## 2022-04-10 DIAGNOSIS — Z4823 Encounter for aftercare following liver transplant: Secondary | ICD-10-CM | POA: Diagnosis not present

## 2022-04-10 DIAGNOSIS — K760 Fatty (change of) liver, not elsewhere classified: Secondary | ICD-10-CM | POA: Diagnosis not present

## 2022-04-10 DIAGNOSIS — Z8673 Personal history of transient ischemic attack (TIA), and cerebral infarction without residual deficits: Secondary | ICD-10-CM | POA: Diagnosis not present

## 2022-04-10 DIAGNOSIS — Z23 Encounter for immunization: Secondary | ICD-10-CM | POA: Diagnosis not present

## 2022-04-11 ENCOUNTER — Ambulatory Visit: Payer: Medicare HMO | Admitting: Pharmacist

## 2022-04-11 DIAGNOSIS — I10 Essential (primary) hypertension: Secondary | ICD-10-CM

## 2022-04-11 DIAGNOSIS — N390 Urinary tract infection, site not specified: Secondary | ICD-10-CM

## 2022-04-11 DIAGNOSIS — Z944 Liver transplant status: Secondary | ICD-10-CM

## 2022-04-11 DIAGNOSIS — N1831 Chronic kidney disease, stage 3a: Secondary | ICD-10-CM

## 2022-04-11 DIAGNOSIS — I5032 Chronic diastolic (congestive) heart failure: Secondary | ICD-10-CM

## 2022-04-11 NOTE — Progress Notes (Signed)
Chronic Care Management Pharmacy Note  04/11/2022 Name:  Devin White MRN:  [id number] DOB:  [birth year]  Summary: CCM F/U visit -Pt has stayed out of hospital for 2 months since being on trimethoprim for UTI prophylaxis - he says longest he has been able to stay out of hospital in a long time -Pt never had follow up with neurology after 10/2021 hospitalization for TIA vs atypical migraine - he was on rosuvastatin for 30 days but stopped when Rx ran out; LDL was 69 in hospital prior to statin; if he did not truly have a TIA it would not be indicated for him to continue a statin, need to clarify this -For future- hospital notes have noted hx of sleep apnea, it does not appear pt ever had sleep study done. He saw pulmonology 11/2015 and they had recommended sleep study but looks like pt was lost to follow up. OP sleep study was also recommended during 05/2021 hospitalization due to high-risk STOP-BANG  Recommendations/Changes made from today's visit: -Recommend follow up with neurology (Dr Leonie Man) - unclear if pt needs a referral -IF patient truly had a TIA, would recommend to restart rosuvastatin 5 mg daily -Consider referral for sleep study to evaluate sleep apnea in future  Plan: -Pharmacist follow up televisit scheduled for 2 months -PCP CPE 05/16/22    Subjective: Devin White is an 66 y.o. year old male who is a primary patient of Devin Carbon, MD.  The CCM team was consulted for assistance with disease management and care coordination needs.    Engaged with patient by telephone for follow up visit in response to provider referral for pharmacy case management and/or care coordination services.   Consent to Services:  The patient was given information about Chronic Care Management services, agreed to services, and gave verbal consent prior to initiation of services.  Please see initial visit note for detailed documentation.   Patient Care Team: Devin Carbon, MD as  PCP - General Lenord Fellers Cleaster Corin, [facility] as Pharmacist (Pharmacist)   Recent office visits: 02/07/22 Dr Silvio Pate VV: hospital f/u - try trimethoprim 100 mg daily for ppx. Advise f/u with urology.  11/14/21 Dr Silvio Pate VV: hospital f/u - outpatient f/u with Dr Leonie Man. 10/12/21 Dr Silvio Pate OV: hospital f/u - reduce amlodipine back to 5 mg. D/c allopurinol (on infusions at [facility] for gout) 09/05/21 Dr Silvio Pate OV: hospital f/u - restart lisinopril at 5 mg daily.  05/02/21 Dr Silvio Pate OV: AWV and hospital f/u - pt did not start carvedilol, amlodipine or lisinopril. Advised to start lisinopril for BP and kidneys.  12/12/20 Devin Carbon, MD (PCP): Chronic tophaceous gout . D/C hydralazine, lisinopril, oxycodone (pt no longer taking)  Recent consult visits: 04/10/22 NP Emilio Math (Transplant): gave Prevnar 20. Schedule rheum - ask about prednisone dose. Schedule endoscopies.  01/09/22 Dr Loletha Grayer ([facility]): no changes 09/06/21 Dr Crisoforo Oxford ([facility]): no changes 08/02/21 Dr Zigmund Daniel [facility]): add carvedilol 3.125 mg BID 07/05/21 Dr Zigmund Daniel [facility]): start amlodipine 5 mg. 07/04/21 Dr Loletha Grayer ([facility]): no changes  06/04/21 Dr Dionne Milo [facility]): f/u VTE; stop Eliquis; f/u PRN. Go to ED for elevated WBC and elbow 05/24/21 Dr Crisoforo Oxford (rheumatology): f/u gout; current flare; he has flares twice a month; Restart colchicine 0.6 mg QOD; given Depo-Medrol for flare; Continue allopurinol until Krystexxa started 05/14/21 PA Josiah Lobo [facility] Hematology): f/u mesenteric vein thrombosis. Pt became SOB, diaphoretic, emetic in office. Advised to go to ED. 04/01/21 Dr Amalia Hailey ([facility]): new consult -f/u AKI in ED. labs today; f/u 4 months. 02/22/21 Dr Crisoforo Oxford (rheumatology): f/u gout. Increase allopurinol to 300 mg. Rx'd prednisone taper 60 mg. Advised not to increase colchicine during flare. 02/22/21 Dr Royanne Foots (dermatology): skin check, benign. 01/01/21 Dr Loletha Grayer ([facility])-  f/u Liver transplant. Maintain prednisone w/o taper in light of recent medical problems. Referred to dermatology. Refer to nephrology. Advised covid and shingrix vaccines. 12/29/20 PA Linton Ham [facility]ematology) - f/u Mesenteric vein thrombosis. VTE provoked in s/o appendectomy surgery. Plan 3 months of Eliquis.  Hospital visits: 01/29/22 - 02/02/22 Admission [facility]): complicated UTI 06/25/30 - 12/31/21 Admission ([facility]): severe sepsis 10/26/21 - 11/07/21 Admission [facility]): Cdiff. Completed oral vanc. TIA - started aspirin 325. Trial topamax and imitrex.  10/19/21 - 10/24/21 Admission ([facility]): acute gastroenteritis.  09/29/21 - 10/06/21 Admission ([facility]): chronic gout. Given 3 days of anakinra injections. Increased amlodipine to 10 mg. 09/14/21 ED visit [facility]): chest pain, SOB x weeks. R/o ACS. No concerning findings.  08/09/21 - 08/16/21 Admission ([facility]): fever, N/V, severe gout. Received Anakinra, discharged on prednisone and colchicine. 06/04/21-06/08/21 Admission ([facility]): Septic bursitis. New DOE concerning for HFpEF vs angina. Rec OP stress test. Consider OP sleep study given high-risk STOP-BANG. 05/14/21-05/15/21 ED visit ([facility]): SOB, diaphoresis. 04/26/21-05/01/21 Admission ([facility]): Elevated LFTs, facial swelling 2/2 viral infection vs ischemic hepatitis from dehydration  Objective:  Lab Results  Component Value Date   CREATININE 1.38 (H) 02/01/2022   BUN 18 02/01/2022   GFR 50.65 (L) 12/12/2020   GFRNONAA 57 (L) 02/01/2022   GFRAA 48 (L) 08/11/2019   NA 142 02/01/2022   K 3.2 (L) 02/01/2022   CALCIUM 8.9 02/01/2022   CO2 23 02/01/2022   GLUCOSE 142 (H) 02/01/2022    Lab Results  Component Value Date/Time   HGBA1C 5.1 01/30/2022 12:49 AM   HGBA1C 6.2 (H) 10/26/2021 01:50 PM   GFR 50.65 (L) 12/12/2020 08:56 AM   GFR 48.07 (L) 04/15/2016 03:51 PM    Last diabetic Eye exam: No results found for: "HMDIABEYEEXA"  Last diabetic Foot exam: No results found for: "HMDIABFOOTEX"   Lab Results  Component  Value Date   CHOL 158 05/22/2012   HDL 34.10 (L) 05/22/2012   LDLDIRECT 69.4 11/05/2021   TRIG 284.0 (H) 05/22/2012   CHOLHDL 5 05/22/2012       Latest Ref Rng & Units 01/29/2022    4:41 PM 12/31/2021    3:49 AM 12/30/2021    3:40 AM  Hepatic Function  Total Protein 6.5 - 8.1 g/dL 7.0  5.8  5.8   Albumin 3.5 - 5.0 g/dL 4.1  3.3  3.1   AST 15 - 41 U/L $Remo'20  19  21   'ADTkW$ ALT 0 - 44 U/L 34  32  30   Alk Phosphatase 38 - 126 U/L 51  43  44   Total Bilirubin 0.3 - 1.2 mg/dL 1.3  0.6  0.3     Lab Results  Component Value Date/Time   TSH 3.504 01/30/2022 12:49 AM   TSH 3.99 02/22/2015 08:51 AM   TSH 5.70 (H) 08/17/2013 09:25 AM   FREET4 1.01 02/22/2015 08:51 AM   FREET4 1.13 08/17/2013 09:25 AM       Latest Ref Rng & Units 02/01/2022   10:01 AM 01/30/2022   12:18 PM 01/29/2022    4:41 PM  CBC  WBC 4.0 - 10.5 K/uL 7.1  14.7  21.2   Hemoglobin 13.0 - 17.0 g/dL 13.2  12.6  14.3  Hematocrit 39.0 - 52.0 % 39.6  37.4  43.0   Platelets 150 - 400 K/uL 151  138  175     No results found for: "VD25OH"  Clinical ASCVD: No  The ASCVD Risk score (Arnett DK, et al., 2019) failed to calculate for the following reasons:   Cannot find a previous HDL lab   Cannot find a previous total cholesterol lab       05/02/2021   12:49 PM 05/02/2021   11:57 AM 05/01/2020    4:43 PM  Depression screen PHQ 2/9  Decreased Interest 0 0 0  Down, Depressed, Hopeless 0 0 0  PHQ - 2 Score 0 0 0     Social History   Tobacco Use  Smoking Status Never   Passive exposure: Past  Smokeless Tobacco Never   BP Readings from Last 3 Encounters:  02/07/22 136/82  02/02/22 (!) 140/85  01/03/22 140/86   Pulse Readings from Last 3 Encounters:  02/02/22 70  12/31/21 69  11/07/21 86   Wt Readings from Last 3 Encounters:  02/07/22 255 lb (115.7 kg)  02/02/22 248 lb 14.4 oz (112.9 kg)  01/03/22 258 lb (117 kg)   BMI Readings from Last 3 Encounters:  02/07/22 35.57 kg/m  02/02/22 33.76 kg/m  01/03/22  35.98 kg/m    Assessment/Interventions: Review of patient past medical history, allergies, medications, health status, including review of consultants reports, laboratory and other test data, was performed as part of comprehensive evaluation and provision of chronic care management services.   SDOH:  (Social Determinants of Health) assessments and interventions performed: Yes SDOH Interventions    Flowsheet Row Chronic Care Management from 07/04/2021 in [place] at [place] Interventions   Financial Strain Interventions Intervention Not Indicated       SDOH Screenings   Depression (PHQ2-9): Low Risk  (05/02/2021)  Financial Resource Strain: Low Risk  (07/04/2021)  Tobacco Use: Low Risk  (02/07/2022)    [place]  No Known Allergies  Medications Reviewed Today     Reviewed by Charlton Haws, [facility] (Pharmacist) on 04/15/22 at [place] List Status: <None>   Medication Order Taking? Sig Documenting Provider Last Dose Status Informant  acetaminophen (TYLENOL) 325 MG tablet [id number] Yes Take 650 mg by mouth every 6 (six) hours as needed for mild pain. [provider] Taking Active Self  amLODipine (NORVASC) 5 MG tablet [id number] Yes Take 5 mg by mouth daily. [provider] Taking Active   ascorbic acid (VITAMIN C) 500 MG tablet [id number] Yes Take 1 tablet (500 mg total) by mouth daily. Hosie Poisson, MD Taking Active Self  aspirin 325 MG tablet [id number] Yes Take 1 tablet (325 mg total) by mouth daily. Hosie Poisson, MD Taking Active Self  calcium carbonate (TUMS) 500 MG chewable tablet [id number] Yes Chew 1 tablet (200 mg of elemental calcium total) by mouth 2 (two) times daily as needed for indigestion or heartburn. Tonia Ghent, MD Taking Active Self  colchicine 0.6 MG tablet [id number] Yes Take 1 tablet (0.6 mg total) by mouth daily as needed.  Patient taking differently: Take 0.6 mg by mouth every other day.   Hosie Poisson, MD  Taking Active Self  diclofenac Sodium (VOLTAREN) 1 % GEL [id number] Yes Apply 4 g topically 4 (four) times daily.  Patient taking differently: Apply 4 g topically 2 (two) times daily as needed (pain).   Devin Carbon, MD Taking Active Self  furosemide (LASIX) 40 MG tablet  [id number] Yes Take 1 tablet (40 mg total) by mouth daily.  Patient taking differently: Take 40 mg by mouth daily as needed.   Devin Carbon, MD Taking Active Self  lisinopril (ZESTRIL) 5 MG tablet [id number] Yes Take 5 mg by mouth daily. [provider] Taking Active   Multiple Vitamin (MULTIVITAMIN WITH MINERALS) TABS tablet [id number] Yes Take 1 tablet by mouth daily. Hosie Poisson, MD Taking Active Self  mycophenolate (CELLCEPT) 250 MG capsule [id number] Yes Take 250-500 mg by mouth 2 (two) times daily. 2 capsules (500 mg) in the morning and one capsule (250 mg) in the evening [provider] Taking Active Self  omeprazole (PRILOSEC) 20 MG capsule [id number] Yes Take 1 capsule (20 mg total) by mouth daily. Devin Carbon, MD Taking Active   predniSONE (DELTASONE) 10 MG tablet [id number] Yes Take 10 mg by mouth daily with breakfast. [provider] Taking Active   rosuvastatin (CRESTOR) 5 MG tablet [id number] Yes Take 1 tablet (5 mg total) by mouth every evening. Hosie Poisson, MD Taking Active Self  senna (SENOKOT) 8.6 MG TABS tablet [id number] Yes Take 2 tablets (17.2 mg total) by mouth daily. Georgette Shell, MD Taking Active   SUMAtriptan [facility]) 100 MG tablet [id number] Yes Take 1 tablet (100 mg total) by mouth once as needed for migraine (may repeat x 1 in 24 hrs not more than 2 days per week. total 9 tablets). May repeat in 2 hours if headache persists or recurs. Hosie Poisson, MD Taking Active Self  tacrolimus (PROGRAF) 0.5 MG capsule [id number] Yes Take 0.5-1 mg by mouth See admin instructions. Taking 0.5 mg in the morning and 1 mg at bedtime [provider] Taking Active Self   tamsulosin (FLOMAX) 0.4 MG CAPS capsule [id number] Yes TAKE 1 CAPSULE(0.4 MG) BY MOUTH DAILY Devin Carbon, MD Taking Active Self  trimethoprim (TRIMPEX) 100 MG tablet [id number] Yes Take 1 tablet (100 mg total) by mouth daily. To prevent UTI Devin Carbon, MD Taking Active   vitamin A 3 MG (10000 UNITS) capsule [id number] Yes Take 1 capsule (10,000 Units total) by mouth daily. Hosie Poisson, MD Taking Active Self  zinc sulfate 220 (50 Zn) MG capsule [id number] Yes Take 1 capsule (220 mg total) by mouth daily. Hosie Poisson, MD Taking Active Self            Patient Active Problem List   Diagnosis Date Noted   Acute pyelonephritis 01/31/2022   Sepsis ([place]) 12/11/9483   Complicated UTI (urinary tract infection) 01/30/2022   Hypokalemia 12/27/2021   Severe sepsis ([place]) 12/26/2021   Acute UTI (urinary tract infection) 12/26/2021   Left sided numbness 11/14/2021   Obesity (BMI 30-39.9) 10/27/2021   Diarrhea 10/26/2021   Stage 3a chronic kidney disease (CKD) ([place]) 10/26/2021   Hyperbilirubinemia 10/26/2021   Hyperglycemia 10/26/2021   Chronic diastolic heart failure ([place]) 09/05/2021   Achilles tendon mass 09/25/2020   Joint pain 09/22/2020   Recurrent UTI 03/26/2019   Pulmonary nodule 08/04/2018   Liver transplant status ([place]) 10/10/2017   Advance directive discussed with patient 10/10/2017   Stage 3b chronic kidney disease ([place]) 10/09/2016   Mallory-Weiss tear 04/16/2016   Long-term use of immunosuppressant medication 04/08/2016   Sleep apnea 10/06/2015   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   Immunosuppression ([place]) 06/01/2012   Hypertension    Routine general medical examination at a health care facility 04/26/2011   Chronic tophaceous gout 12/21/2008   BPH without urinary obstruction  12/21/2008   ALLERGIC RHINITIS 05/22/2007   GERD 05/22/2007   HIATAL HERNIA 05/22/2007   IRRITABLE BOWEL SYNDROME, HX OF 05/22/2007   RENAL CALCULUS,  HX OF 05/22/2007    Immunization History  Administered Date(s) Administered   Fluad Quad(high Dose 65+) 03/26/2019, 05/01/2020, 05/02/2021, 03/15/2022   Influenza Split 04/15/2011   Influenza Whole 04/17/2010   Influenza, High Dose Seasonal PF 03/30/2018   Influenza, Seasonal, Injecte, Preservative Fre 05/22/2012   Influenza,inj,Quad PF,6+ Mos 08/09/2013, 03/30/2015, 03/22/2016, 04/07/2017   Pneumococcal Conjugate-13 12/01/2014   Pneumococcal Polysaccharide-23 06/01/2012, 10/10/2017   Tdap 05/22/2012    Conditions to be addressed/monitored:  Hypertension, GERD, Chronic Kidney Disease, BPH, and Gout, Liver transplant status  Care Plan : [place]  Updates made by Charlton Haws, [place] since 04/15/2022 12:00 AM     Problem: Hypertension, GERD, Chronic Kidney Disease, BPH, and Gout, Liver transplant status   Priority: High     Long-Range Goal: Disease mgmt   Start Date: 07/04/2021  Expected End Date: 07/04/2022  This Visit's Progress: On track  Recent Progress: On track  Priority: High  Note:   Current Barriers:  Multiple admissions, specialists and med changes Unclear history of TIA vs migraine; has not followed up with neurology  Pharmacist Clinical Goal(s):  Patient will contact provider office for questions/concerns as evidenced notation of same in electronic health record through collaboration with PharmD and provider.   Interventions: 1:1 collaboration with Devin Carbon, MD regarding development and update of comprehensive plan of care as evidenced by provider attestation and co-signature Inter-disciplinary care team collaboration (see longitudinal plan of care) Comprehensive medication review performed; medication list updated in electronic medical record   Hypertension / CKD Stage 3a (BP goal <130/80) -Controlled- BP at goal recently in clinic visits; of note patient has been taking whole tablets of amlodipine and lisinopril (prescribed 1/2  tablet) and BP has been stable -Hx of high BP in hospital, low BP at home leading to passing out -Current home BP readings: n/a -Current treatment: Lisinopril 5 mg daily - Appropriate, Effective, Safe, Accessible Amlodipine 5 mg daily - Appropriate, Effective, Safe, Accessible Furosemide 40 mg daily PRN - Appropriate, Effective, Safe, Accessible -Medications previously tried: amlodipine, carvedilol -Denies hypotensive/hypertensive symptoms -Educated on BP goals and benefits of medications for prevention of heart attack, stroke and kidney damage; Importance of home blood pressure monitoring -Counseled to monitor BP at home daily, report readings consistently > 140/90 -Recommended to continue current medication; consider referral for sleep study   Query Hyperlipidemia / TIA (LDL goal < 70) -Query history of TIA 10/2021 - per hospital notes it is unclear if this was TIA or complicated migraine; he was discharged on 30-ds of rosuvastatin but stopped when Rx ran out; he was meant to follow up with neurology (Dr Leonie Man) after hospitalization but this never happened -LDL was 69 (10/2021) during hospital stay, prior to statin initiation -Current treatment: Aspirin 325 mg daily - Appropriate, Effective, Safe, Accessible -Medications previously tried: rosuvastatin 5mg  -Reviewed history - needs f/u with neurology to clarify TIA vs atypical migraine; if he truly did have a TIA, would recommend to restart rosuvastatin 5 mg; otherwise a statin would not be indicated  Query Migraines (Goal: reduce frequency) -Hospitalized 10/2021 with nonspecific neurologic symptoms, concern for TIA vs complicated migraine - prescribed trial of topamax and imitrex. -Current treatment  None -Medications previously tried: n/a  -Reviewed purpose of medications for possible atypical migraine, pt has not had repeat of TIA-like symptoms that  he is aware of  Query OSA -Hx sleep apnea noted, no record of recent sleep study in  chart, pt declined CPAP in hospitalizations and does not use it at home. Per chart review sleep study was being pursued 11/2015 with pulmonology but looks like patient was lost to follow up. Sleep study was also recommended during 05/2021 hospitalization for sepsis due to high-risk STOP-BANG -Consider referral for sleep study once patient is more stable  Gout (Goal: uric acid <5) -Controlled - pt is still taking allopurinol and colchicine after starting Krystexxa 06/14/21 through [facility] -Hx severe gout, multiple hospitalizations related to gout flares -Current treatment  Allopurinol 300 mg daily - Query Appropriate, Effective, Safe, Accessible Colchicine 0.6 mg QOD - Appropriate, Effective, Safe, Accessible Krystexxa infusions q 2 weeks - Appropriate, Effective, Safe, Accessible -Discussed rheumatology notes -previous plan was to stop allopurinol when starting Krystexxa; pt will follow up with rheumatology for further instructions -Recommended to continue current medication   Liver transplant status (Goal: prevent rejections) -Controlled - pt has had multiple hospitalizations recently with mildly elevated AST/ALT and bili, extensive workups and transplant follow up have determined he is now stable; he endorses compliance with below regimen -transplant 2009 for NAFLD, overall stable; follows with [facility] -Current treatment  Tacrolimus 0.5 mg BID - Appropriate, Effective, Safe, Accessible Mycophenolate 250 mg - 2 cap AM and 1 cap PM - Appropriate, Effective, Safe, Accessible Prednisone 5 mg - Appropriate, Effective, Safe, Accessible -Discussed importance of compliance to prevent rejections -Recommended to continue current medication  Recurrent UTI (Goal: prevent UTI -Controlled - pt has stayed out of the hospital for 2 months since starting trimethoprim, the longest he has stayed out in a while per pt -Current treatment  Trimethoprim 100 mg daily - Appropriate, Effective,  Safe, Accessible -Medications previously tried: n/a -Reviewed benefits of trimethoprim for UTI prophylaxis  -Recommended to continue current medication  BPH (Goal: manage symptoms) -Controlled - per pt report -Current treatment  Tamsulosin 0.4 mg daily - Appropriate, Effective, Safe, Accessible -Recommended to continue current medication   GERD (Goal: manage symptoms) -Controlled - per pt report -Current treatment  Omeprazole 20 mg BID - Appropriate, Effective, Safe, Accessible Tums PRN - Appropriate, Effective, Safe, Accessible -Recommended to continue current medication   Hx of Mesenteric vein thrombosis (Treatment completed) -Likely provoked after appendectomy 11/2020. Initial plans was for 3 months of Eliquis, pt missed appt in Sept 2022 and was admitted 04/2021; Eliquis stopped Dec 2022 -Discussed s/sx of clots; continue to monitor   Health Maintenance -Vaccine gaps: none   Patient Goals/Self-Care Activities Patient will:  - take medications as prescribed as evidenced by patient report and record review focus on medication adherence by pill box check blood pressure daily, document, and provide at future appointments     Medication Assistance: None required.  Patient affirms current coverage meets needs.  Compliance/Adherence/Medication fill history: Care Gaps: None  Star-Rating Drugs: Lisinopril - LF 06/08/21 x 30 ds; PDC 85%  Medication Access: Within the past 30 days, how often has patient missed a dose of medication? 0 Is a pillbox or other method used to improve adherence? Yes  Factors that may affect medication adherence? no barriers identified Are meds synced by current pharmacy? No  Are meds delivered by current pharmacy? No  Does patient experience delays in picking up medications due to transportation concerns? No   [facility] Services Reviewed: Is patient disadvantaged to use [facility]?: No  Current Rx insurance plan: [facility] Name and location  of Current pharmacy:  [facility] [place], [place] AT [address] [place] [address] Phone: [phone] Fax: [phone]  [facility] services reviewed with patient today?: No  Patient requests to transfer care to [facility]?: No  Reason patient declined to change pharmacies: Not mentioned at this visit   Care Plan and Follow Up Patient Decision:  Patient agrees to Care Plan and Follow-up.  Plan: Telephone follow up appointment with care management team member scheduled for:  2 months  Charlene Brooke, PharmD, [facility] Clinical Pharmacist [facility] at [facility] [phone]

## 2022-04-15 ENCOUNTER — Telehealth: Payer: Self-pay | Admitting: Pharmacist

## 2022-04-15 NOTE — Patient Instructions (Signed)
Visit Information  Phone number for Pharmacist: [phone]   Goals Addressed   None     Care Plan : [place]  Updates made by Charlton Haws, [facility] since 04/15/2022 12:00 AM     Problem: Hypertension, GERD, Chronic Kidney Disease, BPH, and Gout, Liver transplant status   Priority: High     Long-Range Goal: Disease mgmt   Start Date: 07/04/2021  Expected End Date: 07/04/2022  This Visit's Progress: On track  Recent Progress: On track  Priority: High  Note:   Current Barriers:  Multiple admissions, specialists and med changes Unclear history of TIA vs migraine; has not followed up with neurology  Pharmacist Clinical Goal(s):  Patient will contact provider office for questions/concerns as evidenced notation of same in electronic health record through collaboration with PharmD and provider.   Interventions: 1:1 collaboration with Venia Carbon, MD regarding development and update of comprehensive plan of care as evidenced by provider attestation and co-signature Inter-disciplinary care team collaboration (see longitudinal plan of care) Comprehensive medication review performed; medication list updated in electronic medical record   Hypertension / CKD Stage 3a (BP goal <130/80) -Controlled- BP at goal recently in clinic visits; of note patient has been taking whole tablets of amlodipine and lisinopril (prescribed 1/2 tablet) and BP has been stable -Hx of high BP in hospital, low BP at home leading to passing out -Current home BP readings: n/a -Current treatment: Lisinopril 5 mg daily - Appropriate, Effective, Safe, Accessible Amlodipine 5 mg daily - Appropriate, Effective, Safe, Accessible Furosemide 40 mg daily PRN - Appropriate, Effective, Safe, Accessible -Medications previously tried: amlodipine, carvedilol -Denies hypotensive/hypertensive symptoms -Educated on BP goals and benefits of medications for prevention of heart attack, stroke and kidney  damage; Importance of home blood pressure monitoring -Counseled to monitor BP at home daily, report readings consistently > 140/90 -Recommended to continue current medication; consider referral for sleep study   Query Hyperlipidemia / TIA (LDL goal < 70) -Query history of TIA 10/2021 - per hospital notes it is unclear if this was TIA or complicated migraine; he was discharged on 30-ds of rosuvastatin but stopped when Rx ran out; he was meant to follow up with neurology (Dr Leonie Man) after hospitalization but this never happened -LDL was 69 (10/2021) during hospital stay, prior to statin initiation -Current treatment: Aspirin 325 mg daily - Appropriate, Effective, Safe, Accessible -Medications previously tried: rosuvastatin '5mg'$  -Reviewed history - needs f/u with neurology to clarify TIA vs atypical migraine; if he truly did have a TIA, would recommend to restart rosuvastatin 5 mg; otherwise a statin would not be indicated  Query Migraines (Goal: reduce frequency) -Hospitalized 10/2021 with nonspecific neurologic symptoms, concern for TIA vs complicated migraine - prescribed trial of topamax and imitrex. -Current treatment  None -Medications previously tried: n/a  -Reviewed purpose of medications for possible atypical migraine, pt has not had repeat of TIA-like symptoms that he is aware of  Query OSA -Hx sleep apnea noted, no record of recent sleep study in chart, pt declined CPAP in hospitalizations and does not use it at home. Per chart review sleep study was being pursued 11/2015 with pulmonology but looks like patient was lost to follow up. Sleep study was also recommended during 05/2021 hospitalization for sepsis due to high-risk STOP-BANG -Consider referral for sleep study once patient is more stable  Gout (Goal: uric acid <5) -Controlled - pt is still taking allopurinol and colchicine after starting Krystexxa 06/14/21 through [facility] -Hx severe gout, multiple  hospitalizations related  to gout flares -Current treatment  Allopurinol 300 mg daily - Query Appropriate, Effective, Safe, Accessible Colchicine 0.6 mg QOD - Appropriate, Effective, Safe, Accessible Krystexxa infusions q 2 weeks - Appropriate, Effective, Safe, Accessible -Discussed rheumatology notes -previous plan was to stop allopurinol when starting Krystexxa; pt will follow up with rheumatology for further instructions -Recommended to continue current medication   Liver transplant status (Goal: prevent rejections) -Controlled - pt has had multiple hospitalizations recently with mildly elevated AST/ALT and bili, extensive workups and transplant follow up have determined he is now stable; he endorses compliance with below regimen -transplant 2009 for NAFLD, overall stable; follows with [facility] -Current treatment  Tacrolimus 0.5 mg BID - Appropriate, Effective, Safe, Accessible Mycophenolate 250 mg - 2 cap AM and 1 cap PM - Appropriate, Effective, Safe, Accessible Prednisone 5 mg - Appropriate, Effective, Safe, Accessible -Discussed importance of compliance to prevent rejections -Recommended to continue current medication  Recurrent UTI (Goal: prevent UTI -Controlled - pt has stayed out of the hospital for 2 months since starting trimethoprim, the longest he has stayed out in a while per pt -Current treatment  Trimethoprim 100 mg daily - Appropriate, Effective, Safe, Accessible -Medications previously tried: n/a -Reviewed benefits of trimethoprim for UTI prophylaxis  -Recommended to continue current medication  BPH (Goal: manage symptoms) -Controlled - per pt report -Current treatment  Tamsulosin 0.4 mg daily - Appropriate, Effective, Safe, Accessible -Recommended to continue current medication   GERD (Goal: manage symptoms) -Controlled - per pt report -Current treatment  Omeprazole 20 mg BID - Appropriate, Effective, Safe, Accessible Tums PRN - Appropriate, Effective, Safe,  Accessible -Recommended to continue current medication   Hx of Mesenteric vein thrombosis (Treatment completed) -Likely provoked after appendectomy 11/2020. Initial plans was for 3 months of Eliquis, pt missed appt in Sept 2022 and was admitted 04/2021; Eliquis stopped Dec 2022 -Discussed s/sx of clots; continue to monitor   Health Maintenance -Vaccine gaps: none   Patient Goals/Self-Care Activities Patient will:  - take medications as prescribed as evidenced by patient report and record review focus on medication adherence by pill box check blood pressure daily, document, and provide at future appointments      Patient verbalizes understanding of instructions and care plan provided today and agrees to view in [place]. Active MyChart status and patient understanding of how to access instructions and care plan via MyChart confirmed with patient.    Telephone follow up appointment with pharmacy team member scheduled for: 2 months  Charlene Brooke, PharmD, [facility] Clinical Pharmacist [facility] [phone]

## 2022-04-15 NOTE — Telephone Encounter (Signed)
Discussed previous statin use with patient (he was prescribed rosuvastatin 5 mg for 30 days after hospitalization in 10/2021), he is confused because he never heard more about this and stopped it when the Rx ran out. Per hospital notes, his symptoms were suspicious for TIA vs. Atypical migraine and he was discharged on the statin and migraine medications (topamax, imitrex) and advised to follow up with neurology (Dr Leonie Man). He apparently has never had the neurology follow up.  LDL was 69 in hospital prior to statin; if he truly had a TIA it would be recommended for him to resume rosuvastatin; if no TIA, a statin would not be indicated.

## 2022-05-01 DIAGNOSIS — H2512 Age-related nuclear cataract, left eye: Secondary | ICD-10-CM | POA: Diagnosis not present

## 2022-05-06 ENCOUNTER — Encounter: Payer: Self-pay | Admitting: Ophthalmology

## 2022-05-06 ENCOUNTER — Encounter: Payer: Self-pay | Admitting: Anesthesiology

## 2022-05-08 ENCOUNTER — Encounter: Payer: Medicare Other | Admitting: Internal Medicine

## 2022-05-08 NOTE — Discharge Instructions (Signed)

## 2022-05-16 ENCOUNTER — Encounter: Payer: Medicare Other | Admitting: Internal Medicine

## 2022-05-21 ENCOUNTER — Encounter: Payer: Self-pay | Admitting: Ophthalmology

## 2022-05-21 ENCOUNTER — Encounter: Payer: Self-pay | Admitting: Anesthesiology

## 2022-05-28 ENCOUNTER — Ambulatory Visit: Admission: RE | Admit: 2022-05-28 | Payer: Medicare Other | Source: Ambulatory Visit | Admitting: Ophthalmology

## 2022-05-28 ENCOUNTER — Encounter: Admission: RE | Payer: Self-pay | Source: Ambulatory Visit

## 2022-05-28 SURGERY — PHACOEMULSIFICATION, CATARACT, WITH IOL INSERTION
Anesthesia: Topical | Laterality: Left

## 2022-06-08 ENCOUNTER — Other Ambulatory Visit: Payer: Self-pay | Admitting: Internal Medicine

## 2022-06-08 DIAGNOSIS — I10 Essential (primary) hypertension: Secondary | ICD-10-CM

## 2022-06-11 ENCOUNTER — Telehealth: Payer: Self-pay

## 2022-06-11 NOTE — Progress Notes (Signed)
  Chronic Care Management   Note  06/11/2022 Name: Devin White MRN: [id number] DOB: 1955-10-12  Devin White is a 66 y.o. year old male who is a primary care patient of Venia Carbon, MD. I reached out to Devin White by phone today in response to a referral sent by Devin White's PCP.  The first contact attempt was unsuccessful.   Follow up plan: Additional outreach attempts will be made.  Devin White, [place], [place] [address] Direct Dial: [phone] [email or web address]

## 2022-06-12 IMAGING — MR MR HEAD W/O CM
10 series · 48 of 48 positions shown · non-contrast
Comparison: 09/14/2021 MRI head, correlation is also made with
11/02/2021 CTA head neck

CLINICAL DATA: Stroke suspected, left-sided pressure and numbness

EXAM:
MRI HEAD WITHOUT CONTRAST
TECHNIQUE: Multiplanar, multiecho pulse sequences of the brain and surrounding
structures were obtained without intravenous contrast.

[Series 5: DWI · axial · 3.0mm · 1.36mm/px · z∈[-20,+131]mm · 11 of 104 slices shown (1 of 4)]
[im 1/104]
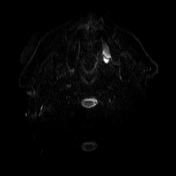
[im 11/104]
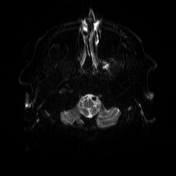
[im 21/104]
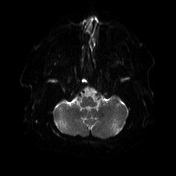
[im 31/104]
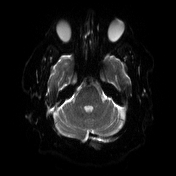
[im 42/104]
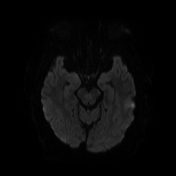
[im 52/104]
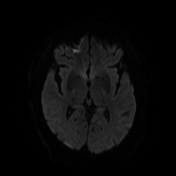
[im 62/104]
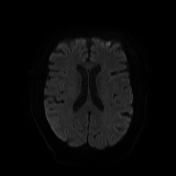
[im 73/104]
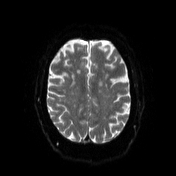
[im 83/104]
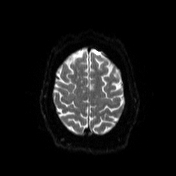
[im 93/104]
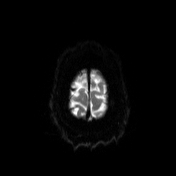
[im 104/104]
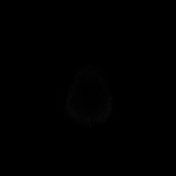

[Series 6: DWI · axial · 3.0mm · 1.36mm/px · z∈[-20,+131]mm · 5 of 52 slices shown (2 of 4)]
[im 1/52]
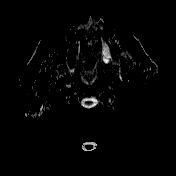
[im 13/52]
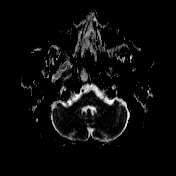
[im 26/52]
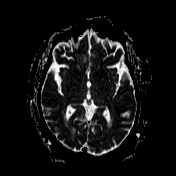
[im 39/52]
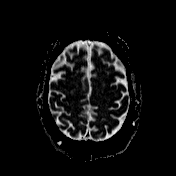
[im 52/52]
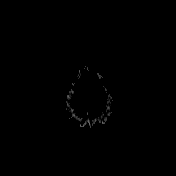

[Series 7: T1 · sagittal · 5.0mm · 0.75mm/px · 3 of 24 slices shown (1 of 2)]
[im 1/24]
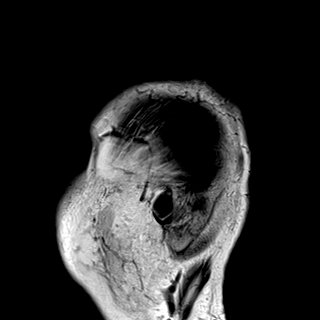
[im 12/24]
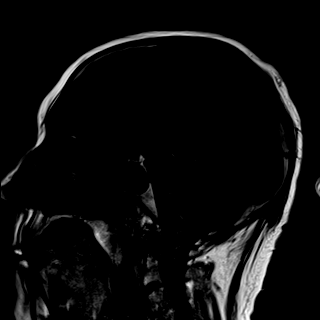
[im 24/24]
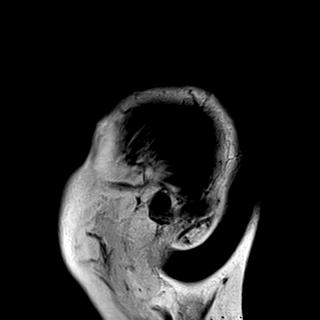

[Series 8: T2 · axial · 5.0mm · 0.62mm/px · z∈[-17,+130]mm · 3 of 24 slices shown (1 of 2)]
[im 1/24]
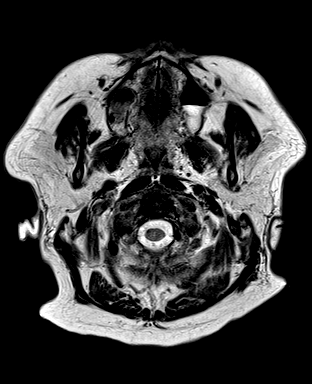
[im 12/24]
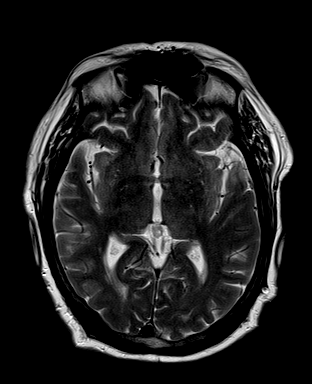
[im 24/24]
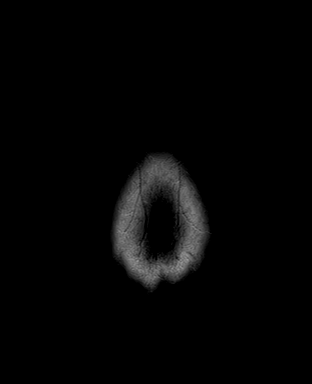

[Series 9: GRE · axial · 3.0mm · 0.45mm/px · z∈[-18,+130]mm · 5 of 51 slices shown]
[im 1/51]
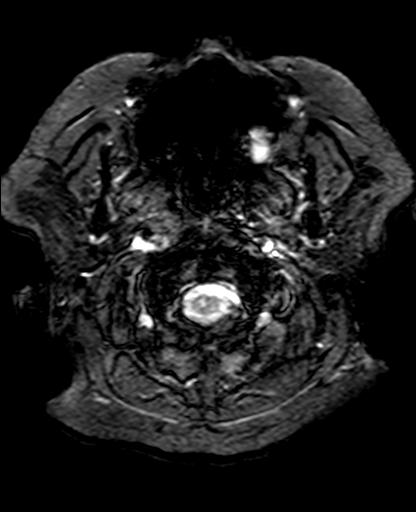
[im 13/51]
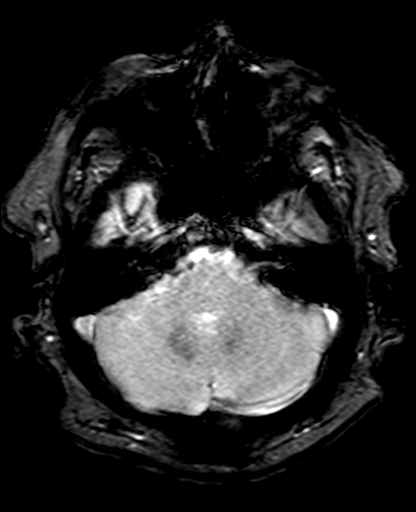
[im 26/51]
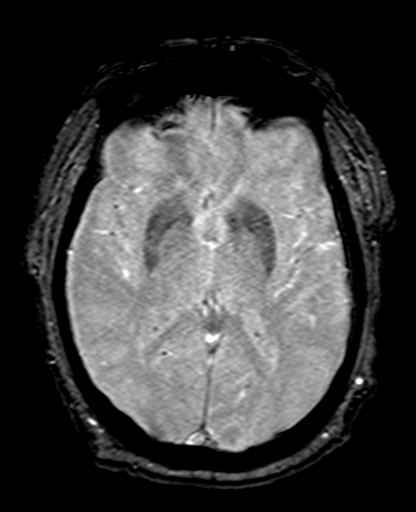
[im 38/51]
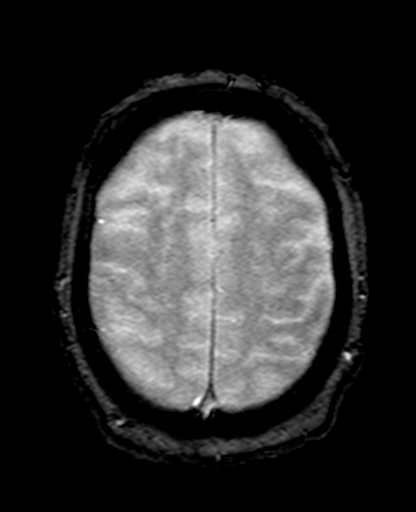
[im 51/51]
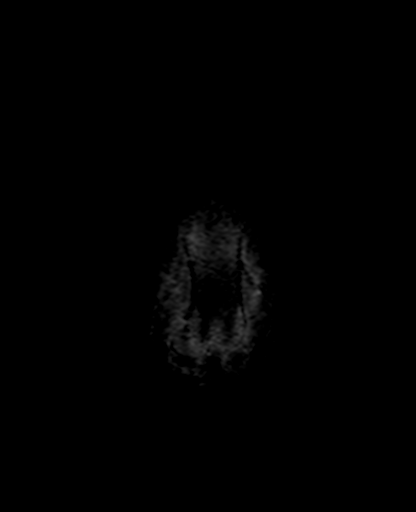

[Series 10: FLAIR · axial · 3.0mm · 0.86mm/px · z∈[-19,+129]mm · 5 of 51 slices shown]
[im 1/51]
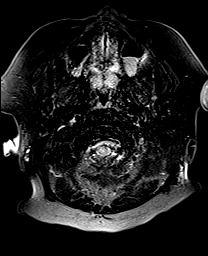
[im 13/51]
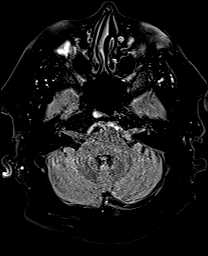
[im 26/51]
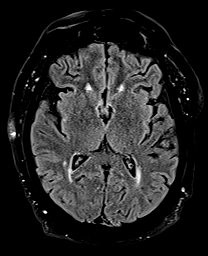
[im 38/51]
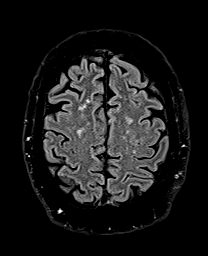
[im 51/51]
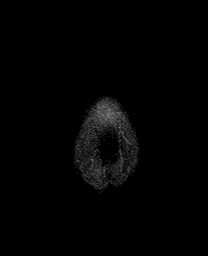

[Series 11: T1 · axial · 3.0mm · 0.47mm/px · z∈[-19,+129]mm · 5 of 51 slices shown (2 of 2)]
[im 1/51]
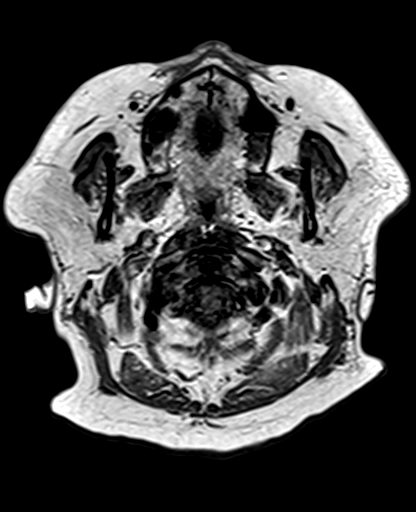
[im 13/51]
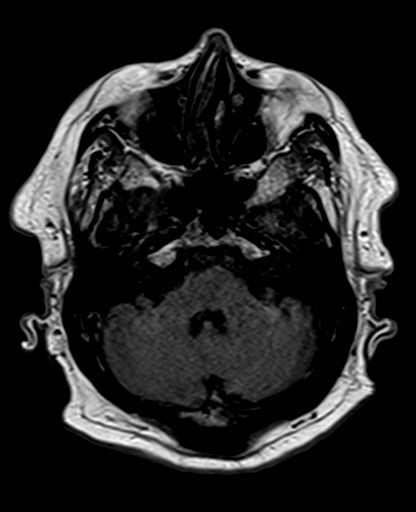
[im 26/51]
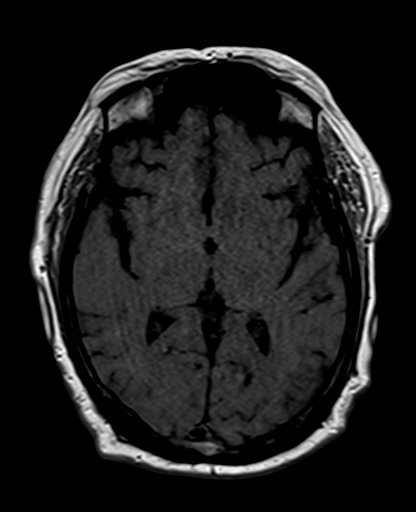
[im 38/51]
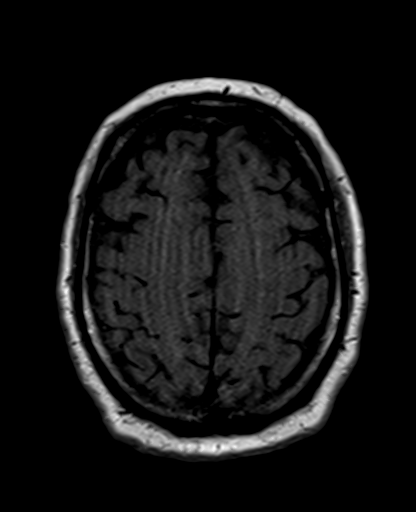
[im 51/51]
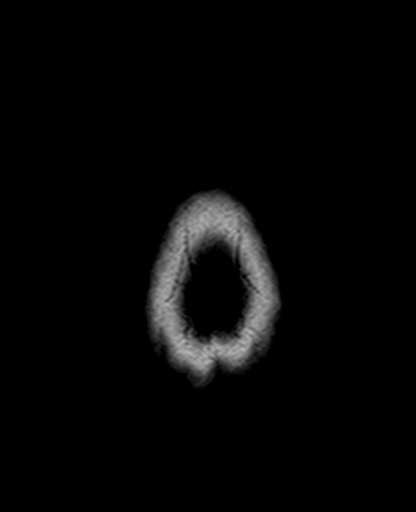

[Series 12: DWI · coronal · 5.0mm · 1.31mm/px · 5 of 52 slices shown (3 of 4)]
[im 1/52]
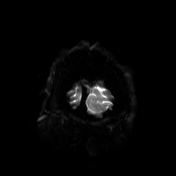
[im 13/52]
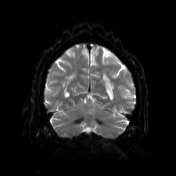
[im 26/52]
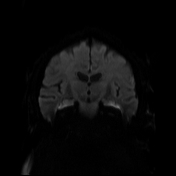
[im 39/52]
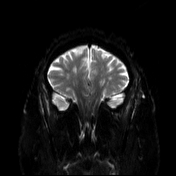
[im 52/52]
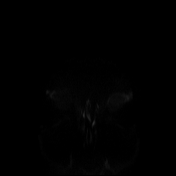

[Series 13: DWI · coronal · 5.0mm · 1.31mm/px · 3 of 26 slices shown (4 of 4)]
[im 1/26]
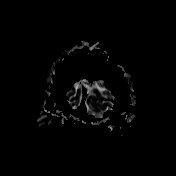
[im 13/26]
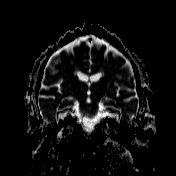
[im 26/26]
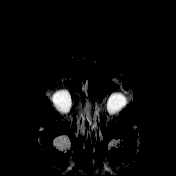

[Series 14: T2 · coronal · 5.0mm · 0.86mm/px · 3 of 24 slices shown (2 of 2)]
[im 1/24]
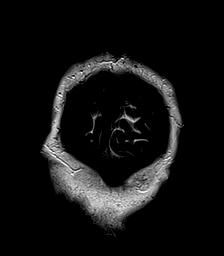
[im 12/24]
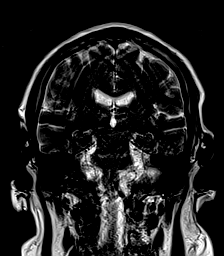
[im 24/24]
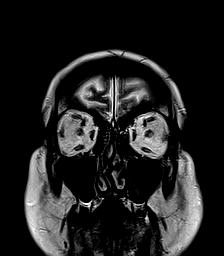

[48 of 48 positions shown; findings below may reference images not displayed]

FINDINGS: Brain: No restricted diffusion to suggest acute or subacute infarct.
No acute hemorrhage, mass, mass effect, or midline shift. No
hydrocephalus or extra-axial collection. Scattered T2 hyperintense
signal in the periventricular white matter, likely the sequela of
moderate chronic small vessel ischemic disease.

Vascular: Again noted is a hypoplastic right vertebral artery.
Otherwise normal arterial flow voids.

Skull and upper cervical spine: Normal marrow signal.

Sinuses/Orbits: Mucous retention cysts in the
left-greater-than-right maxillary sinus. Otherwise negative. The
orbits are unremarkable.

Other: The mastoids are well aerated.
IMPRESSION: No acute intracranial process. No evidence of acute or subacute
infarct.

## 2022-06-12 IMAGING — CT CT ANGIO HEAD-NECK (W OR W/O PERF)
2 of 11 series · 7 of 33 positions shown · non-contrast
Comparison: Brain MRI 09/14/2021. CTA head and neck 04/07/2021.
Head CT 09/14/2021 and earlier.
COMPARISON: Brain MRI 09/14/2021. CTA head and neck 04/07/2021.
Head CT 09/14/2021 and earlier.

Addendum:
CLINICAL DATA: 65-year-old male left side headache, pain radiating
down the arm. Neurologic deficit.

EXAM:
CT ANGIOGRAPHY HEAD AND NECK
TECHNIQUE: Multidetector CT imaging of the head and neck was performed using
the standard protocol during bolus administration of intravenous
contrast. Multiplanar CT image reconstructions and MIPs were
obtained to evaluate the vascular anatomy. Carotid stenosis
measurements (when applicable) are obtained utilizing NASCET
criteria, using the distal internal carotid diameter as the
denominator.

[Series 9: cta head neck · axial · 0.42mm/px · z∈[-154,-34]mm · 2 of 182 slices shown]
[im 61/182  soft-tissue]
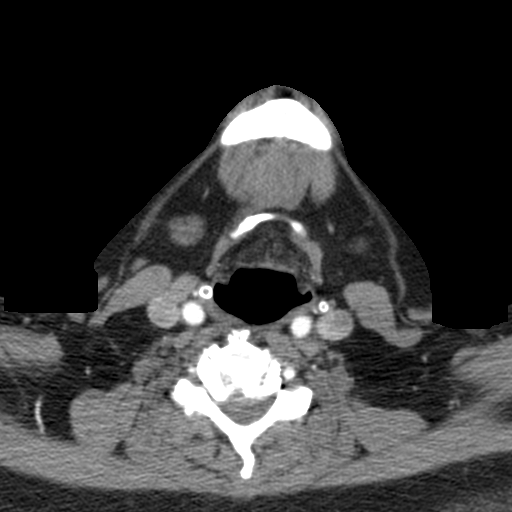
[im 121/182  soft-tissue]
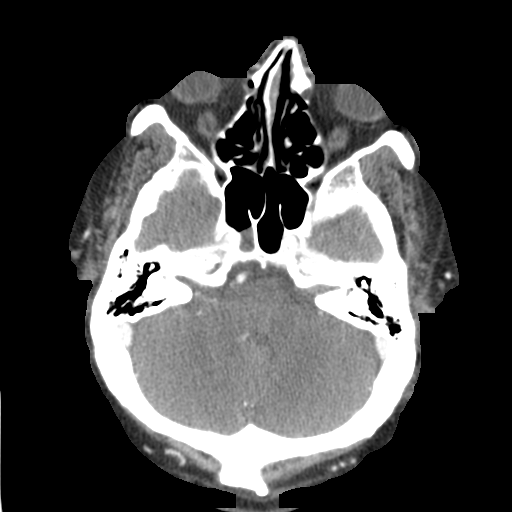

[Series 11: ax thin · axial · 0.41mm/px · z∈[-214,+27]mm · 5 of 363 slices shown]
[im 61/363  soft-tissue]
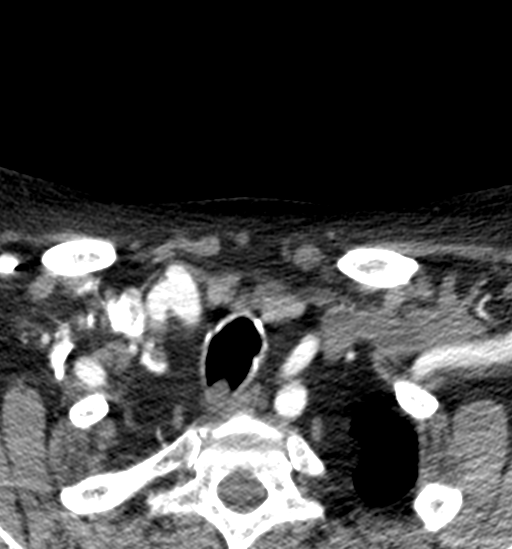
[im 121/363  bone]
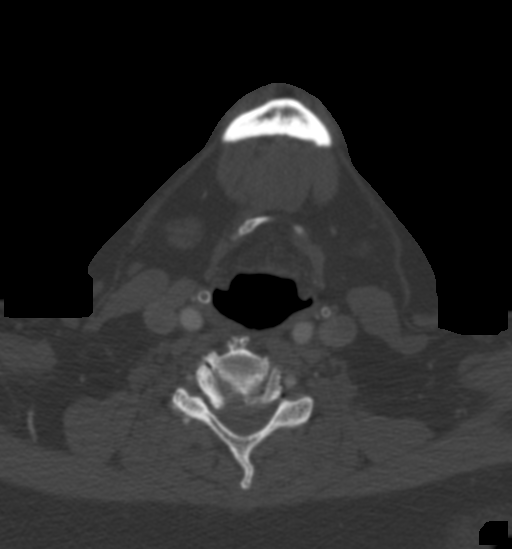
[im 182/363  soft-tissue]
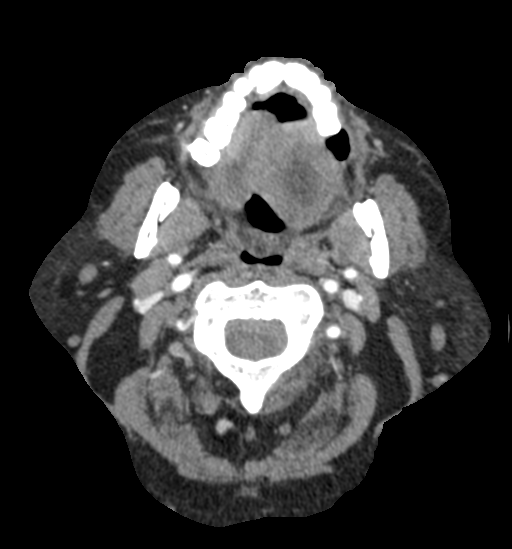
[im 242/363  bone]
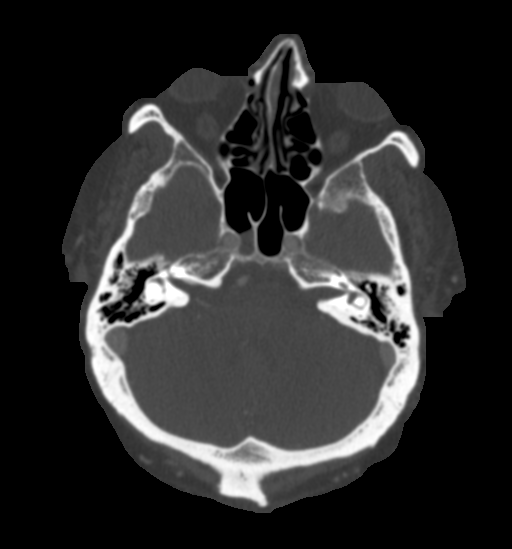
[im 302/363  soft-tissue]
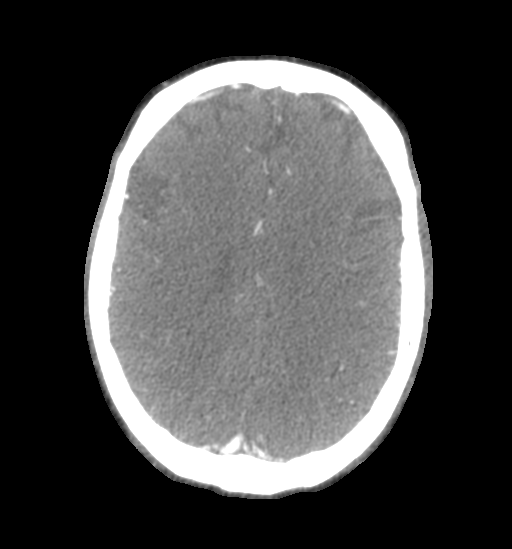

[7 of 33 positions shown; findings below may reference images not displayed]

RADIATION DOSE REDUCTION: This exam was performed according to the
departmental dose-optimization program which includes automated
exposure control, adjustment of the mA and/or kV according to
patient size and/or use of iterative reconstruction technique.

CONTRAST:  75mL OMNIPAQUE IOHEXOL 350 MG/ML SOLN
FINDINGS: CT HEAD

Brain: No midline shift, ventriculomegaly, mass effect, evidence of
mass lesion, intracranial hemorrhage or evidence of cortically based
acute infarction. Patchy bilateral cerebral white matter hypodensity
appears stable since [REDACTED]. Deep gray nuclei relatively spared.
Stable gray-white matter differentiation throughout the brain.

Calvarium and skull base: No acute osseous abnormality identified.

Paranasal sinuses: Stable mild paranasal sinus mucosal thickening
and/or retention cysts. Tympanic cavities and mastoids remain well
aerated.

Orbits: No acute orbit or scalp soft tissue finding.

CTA NECK

Skeleton: Chronic cervical spine degeneration with anterolisthesis
C3 on C4 and facet arthropathy. Spina bifida occulta at T1. No acute
osseous abnormality identified.

Upper chest: Stable. Mediastinal lipomatosis, mild apical lung
scarring or atelectasis.

Other neck: Stable, negative.

Aortic arch: 3 vessel arch configuration. Mild tortuosity and
calcified atherosclerosis of the distal arch.

Right carotid system: Brachiocephalic artery and right CCA origin
are mildly tortuous without plaque or stenosis. Negative right
carotid bifurcation. Mild tortuosity of the cervical right ICA.

Left carotid system: Similar tortuosity without plaque or stenosis.

Vertebral arteries:
Tortuous proximal right subclavian artery without plaque or
stenosis. Normal right vertebral artery origin. Non dominant right
vertebral artery remains diminutive but patent to the skull base
without stenosis.

Proximal left subclavian artery and left vertebral artery origin are
normal. Dominant, tortuous left vertebral artery is patent to the
skull base with no plaque or stenosis.

CTA HEAD

Posterior circulation: Dominant left and diminutive right distal
vertebral arteries are patent to the vertebrobasilar junction
without stenosis. Normal left PICA origin. Right AICA appears
dominant. Patent basilar artery with mild tortuosity, no stenosis.
Patent SCA origins. Fetal type bilateral PCA origins more so the
left. Bilateral PCA branches are stable and within normal limits.

Anterior circulation: Both ICA siphons are patent. Mild bilateral
siphon calcified plaque. No siphon stenosis. Mild tortuosity
bilaterally. Ophthalmic and posterior communicating artery origins
are normal. Patent carotid termini. Patent MCA and ACA origins.
Dominant right A1 segment. Anterior communicating artery may be
mildly fenestrated but appears stable. Bilateral ACA branches are
stable and within normal limits. Left MCA M1 segment and
trifurcation are patent without stenosis. Right MCA M1 segment and
trifurcation are patent without stenosis. Bilateral MCA branches are
stable and within normal limits.

Venous sinuses: Patent.

Anatomic variants: Dominant left and diminutive right vertebral
arteries. Fetal type bilateral PCA origins more so the left.
Dominant right and diminutive left ACA A1 segments.

Review of the MIP images confirms the above findings
IMPRESSION: 1. Stable CTA Head and Neck since [REDACTED]. Mild for age
atherosclerosis with no arterial stenosis.
2. Stable CT appearance of the brain since [REDACTED], no acute
intracranial abnormality identified.
3. Chronically advanced cervical spine degeneration.

ADDENDUM:
Study discussed by telephone with RN Padala Firos on 11/02/2021 at
3929 hours.

*** End of Addendum ***
RADIATION DOSE REDUCTION: This exam was performed according to the
departmental dose-optimization program which includes automated
exposure control, adjustment of the mA and/or kV according to
patient size and/or use of iterative reconstruction technique.

CONTRAST:  75mL OMNIPAQUE IOHEXOL 350 MG/ML SOLN
FINDINGS: CT HEAD

Brain: No midline shift, ventriculomegaly, mass effect, evidence of
mass lesion, intracranial hemorrhage or evidence of cortically based
acute infarction. Patchy bilateral cerebral white matter hypodensity
appears stable since [REDACTED]. Deep gray nuclei relatively spared.
Stable gray-white matter differentiation throughout the brain.

Calvarium and skull base: No acute osseous abnormality identified.

Paranasal sinuses: Stable mild paranasal sinus mucosal thickening
and/or retention cysts. Tympanic cavities and mastoids remain well
aerated.

Orbits: No acute orbit or scalp soft tissue finding.

CTA NECK

Skeleton: Chronic cervical spine degeneration with anterolisthesis
C3 on C4 and facet arthropathy. Spina bifida occulta at T1. No acute
osseous abnormality identified.

Upper chest: Stable. Mediastinal lipomatosis, mild apical lung
scarring or atelectasis.

Other neck: Stable, negative.

Aortic arch: 3 vessel arch configuration. Mild tortuosity and
calcified atherosclerosis of the distal arch.

Right carotid system: Brachiocephalic artery and right CCA origin
are mildly tortuous without plaque or stenosis. Negative right
carotid bifurcation. Mild tortuosity of the cervical right ICA.

Left carotid system: Similar tortuosity without plaque or stenosis.

Vertebral arteries:
Tortuous proximal right subclavian artery without plaque or
stenosis. Normal right vertebral artery origin. Non dominant right
vertebral artery remains diminutive but patent to the skull base
without stenosis.

Proximal left subclavian artery and left vertebral artery origin are
normal. Dominant, tortuous left vertebral artery is patent to the
skull base with no plaque or stenosis.

CTA HEAD

Posterior circulation: Dominant left and diminutive right distal
vertebral arteries are patent to the vertebrobasilar junction
without stenosis. Normal left PICA origin. Right AICA appears
dominant. Patent basilar artery with mild tortuosity, no stenosis.
Patent SCA origins. Fetal type bilateral PCA origins more so the
left. Bilateral PCA branches are stable and within normal limits.

Anterior circulation: Both ICA siphons are patent. Mild bilateral
siphon calcified plaque. No siphon stenosis. Mild tortuosity
bilaterally. Ophthalmic and posterior communicating artery origins
are normal. Patent carotid termini. Patent MCA and ACA origins.
Dominant right A1 segment. Anterior communicating artery may be
mildly fenestrated but appears stable. Bilateral ACA branches are
stable and within normal limits. Left MCA M1 segment and
trifurcation are patent without stenosis. Right MCA M1 segment and
trifurcation are patent without stenosis. Bilateral MCA branches are
stable and within normal limits.

Venous sinuses: Patent.

Anatomic variants: Dominant left and diminutive right vertebral
arteries. Fetal type bilateral PCA origins more so the left.
Dominant right and diminutive left ACA A1 segments.

Review of the MIP images confirms the above findings
IMPRESSION: 1. Stable CTA Head and Neck since [REDACTED]. Mild for age
atherosclerosis with no arterial stenosis.
2. Stable CT appearance of the brain since [REDACTED], no acute
intracranial abnormality identified.
3. Chronically advanced cervical spine degeneration.

## 2022-06-18 ENCOUNTER — Other Ambulatory Visit (HOSPITAL_COMMUNITY): Payer: Self-pay

## 2022-06-18 ENCOUNTER — Telehealth: Payer: Self-pay

## 2022-06-18 NOTE — Progress Notes (Signed)
Reason for Encounter: Appointment Reminder  Contacted patient on 06/18/2022   Medications: Outpatient Encounter Medications as of 06/18/2022  Medication Sig   acetaminophen (TYLENOL) 325 MG tablet Take 650 mg by mouth every 6 (six) hours as needed for mild pain.   amLODipine (NORVASC) 5 MG tablet Take 5 mg by mouth daily.   ascorbic acid (VITAMIN C) 500 MG tablet Take 1 tablet (500 mg total) by mouth daily.   aspirin 325 MG tablet Take 1 tablet (325 mg total) by mouth daily.   calcium carbonate (TUMS) 500 MG chewable tablet Chew 1 tablet (200 mg of elemental calcium total) by mouth 2 (two) times daily as needed for indigestion or heartburn.   colchicine 0.6 MG tablet Take 1 tablet (0.6 mg total) by mouth daily as needed. (Patient not taking: Reported on 05/06/2022)   furosemide (LASIX) 40 MG tablet Take 1 tablet (40 mg total) by mouth daily. (Patient not taking: Reported on 05/06/2022)   lisinopril (ZESTRIL) 5 MG tablet Take 5 mg by mouth daily. (Patient not taking: Reported on 05/06/2022)   Multiple Vitamin (MULTIVITAMIN WITH MINERALS) TABS tablet Take 1 tablet by mouth daily.   mycophenolate (CELLCEPT) 250 MG capsule Take 250-500 mg by mouth 2 (two) times daily. 2 capsules (500 mg) in the morning and one capsule (250 mg) in the evening   omeprazole (PRILOSEC) 20 MG capsule Take 1 capsule (20 mg total) by mouth daily.   predniSONE (DELTASONE) 10 MG tablet Take 10 mg by mouth daily with breakfast.   rosuvastatin (CRESTOR) 5 MG tablet Take 1 tablet (5 mg total) by mouth every evening. (Patient not taking: Reported on 05/06/2022)   senna (SENOKOT) 8.6 MG TABS tablet Take 2 tablets (17.2 mg total) by mouth daily.   SUMAtriptan (IMITREX) 100 MG tablet Take 1 tablet (100 mg total) by mouth once as needed for migraine (may repeat x 1 in 24 hrs not more than 2 days per week. total 9 tablets). May repeat in 2 hours if headache persists or recurs. (Patient not taking: Reported on 05/06/2022)   tacrolimus  (PROGRAF) 0.5 MG capsule Take 0.5-1 mg by mouth See admin instructions. Taking 0.5 mg in the morning and 1 mg at bedtime   tamsulosin (FLOMAX) 0.4 MG CAPS capsule TAKE 1 CAPSULE(0.4 MG) BY MOUTH DAILY   trimethoprim (TRIMPEX) 100 MG tablet Take 1 tablet (100 mg total) by mouth daily. To prevent UTI   vitamin A 3 MG (10000 UNITS) capsule Take 1 capsule (10,000 Units total) by mouth daily.   zinc sulfate 220 (50 Zn) MG capsule Take 1 capsule (220 mg total) by mouth daily.   No facility-administered encounter medications on file as of 06/18/2022.   Lab Results  Component Value Date/Time   HGBA1C 5.1 01/30/2022 12:49 AM   HGBA1C 6.2 (H) 10/26/2021 01:50 PM    BP Readings from Last 3 Encounters:  02/07/22 136/82  02/02/22 (!) 140/85  01/03/22 140/86    Unsuccessful attempt to reach patient. Left patient message reminding patient of appointment.  Patient contacted to confirm telephone appointment with Charlene Brooke,  PharmD, on 06/19/2022 at 11:00.  Star Rating Drugs:  Medication:  Last Fill: Day Supply Lisinopril 5 mg  12/31/21 60 Verified with Elv[facility]le - script discontinued per pharmacist  Care Gaps: Annual wellness visit in last year? No  Charlene Brooke, CPP notified  Marijean Niemann, [place] Clinical Pharmacy Assistant [phone] '

## 2022-06-19 ENCOUNTER — Telehealth: Payer: Medicare Other

## 2022-06-19 NOTE — Telephone Encounter (Signed)
Cancelled appointment because patient is not a [facility] patient, and pt has been referred to [facility] team.

## 2022-06-25 ENCOUNTER — Ambulatory Visit: Admission: RE | Admit: 2022-06-25 | Payer: Medicare Other | Source: Ambulatory Visit | Admitting: Ophthalmology

## 2022-06-25 ENCOUNTER — Encounter: Admission: RE | Payer: Self-pay | Source: Ambulatory Visit

## 2022-06-25 SURGERY — PHACOEMULSIFICATION, CATARACT, WITH IOL INSERTION
Anesthesia: Topical | Laterality: Right

## 2022-07-02 ENCOUNTER — Telehealth: Payer: Self-pay

## 2022-07-02 NOTE — Progress Notes (Signed)
  Chronic Care Management   Note  07/02/2022 Name: Devin White MRN: [id number] DOB: [birth year]  Devin White is a 67 y.o. year old male who is a primary care patient of Venia Carbon, MD. I reached out to Devin White by phone today in response to a referral sent by Devin White.  The second contact attempt was unsuccessful.   Follow up plan: Additional outreach attempts will be made.  Noreene Larsson, [place], [place] [address] Direct Dial: [phone] [email or web address]

## 2022-07-11 NOTE — Progress Notes (Signed)
  Chronic Care Management   Note  07/11/2022 Name: Devin White MRN: [id number] DOB: Dec 04, 1955  Devin White is a 67 y.o. year old male who is a primary care patient of Venia Carbon, MD. I reached out to Devin White by phone today in response to a referral sent by Devin White's PCP.  Devin White was given information about Chronic Care Management services today including:  CCM service includes personalized support from designated clinical staff supervised by the physician, including individualized plan of care and coordination with other care providers 24/7 contact phone numbers for assistance for urgent and routine care needs. Service will only be billed when office clinical staff spend 20 minutes or more in a month to coordinate care. Only one practitioner may furnish and bill the service in a calendar month. The patient may stop CCM services at amy time (effective at the end of the month) by phone call to the office staff. The patient will be responsible for cost sharing (co-pay) or up to 20% of the service fee (after annual deductible is met)  Devin White  agreedto scheduling an appointment with the CCM RN Case Manager   Follow up plan: Patient agreed to scheduled appointment with RN Case Manager on 07/16/22 (date/time).  Norton Blizzard, CMA

## 2022-07-16 ENCOUNTER — Ambulatory Visit (INDEPENDENT_AMBULATORY_CARE_PROVIDER_SITE_OTHER): Payer: 59 | Admitting: *Deleted

## 2022-07-16 ENCOUNTER — Other Ambulatory Visit: Payer: Self-pay | Admitting: Internal Medicine

## 2022-07-16 DIAGNOSIS — I10 Essential (primary) hypertension: Secondary | ICD-10-CM

## 2022-07-16 DIAGNOSIS — N1831 Chronic kidney disease, stage 3a: Secondary | ICD-10-CM

## 2022-07-16 NOTE — Chronic Care Management (AMB) (Signed)
Chronic Care Management   CCM RN Visit Note  07/16/2022 Name: Devin White MRN: [id number] DOB: 1955-12-08  Subjective: Devin White is a 67 y.o. year old male who is a primary care patient of Venia Carbon, MD. The patient was referred to the Chronic Care Management team for assistance with care management needs subsequent to provider initiation of CCM services and plan of care.    Today's Visit:  Engaged with patient by telephone for initial visit.     SDOH Interventions Today    Flowsheet Row Most Recent Value  SDOH Interventions   Food Insecurity Interventions Intervention Not Indicated  Housing Interventions Intervention Not Indicated  Transportation Interventions Intervention Not Indicated  Utilities Interventions Intervention Not Indicated  Financial Strain Interventions Intervention Not Indicated  Physical Activity Interventions Intervention Not Indicated  Stress Interventions Intervention Not Indicated  Social Connections Interventions Patient Refused         Goals Addressed             This Visit's Progress    CCM (CHRONIC KIDNEY DISEASE) EXPECTED OUTCOME: MONITOR, SELF-MANAGE AND REDUCE SYMPTOMS OF CHRONIC KIDNEY DISEASE       Current Barriers:  Knowledge Deficits related to Chronic Kidney disease management Chronic Disease Management support and education needs related to Chronic Kidney disease No Advanced Directives in place- pt requests information be mailed Patient reports he had a stroke last year and "I'm doing so much better"  Planned Interventions: Assessed the patient     understanding of chronic kidney disease    Evaluation of current treatment plan related to chronic kidney disease self management and patient's adherence to plan as established by provider      Reviewed prescribed diet low sodium  Reviewed medications with patient and discussed importance of compliance    Counseled on the importance of exercise goals with target of 150  minutes per week     Discussed complications of poorly controlled blood pressure such as heart disease, stroke, circulatory complications, vision complications, kidney impairment, sexual dysfunction    Discussed plans with patient for ongoing care management follow up and provided patient with direct contact information for care management team    Screening for signs and symptoms of depression related to chronic disease state      Assessed social determinant of health barriers    Advanced directives packet mailed  Symptom Management: Take medications as prescribed   Attend all scheduled provider appointments Call pharmacy for medication refills 3-7 days in advance of running out of medications Attend church or other social activities Perform all self care activities independently  Perform IADL's (shopping, preparing meals, housekeeping, managing finances) independently Call provider office for new concerns or questions  Advanced directives packet mailed- please look over and complete  Follow Up Plan: Telephone follow up appointment with care management team member scheduled for: 09/18/22 at 9 am       CCM (HYPERTENSION) EXPECTED OUTCOME: MONITOR, SELF-MANAGE AND REDUCE SYMPTOMS OF HYPERTENSION       Current Barriers:  Knowledge Deficits related to Hypertension management Chronic Disease Management support and education needs related to Hypertension, diet No Advanced Directives in place- pt requests information be mailed Patient reports he lives alone, is independent in all aspects of his care, continues to drive, pt received liver transplant in 2009 and is managed by [facility] Patient reports he checks blood pressure several times per week and readings "are usually ok"  Planned Interventions: Evaluation of current treatment plan related to hypertension  self management and patient's adherence to plan as established by provider;   Reviewed prescribed diet low sodium Reviewed medications with  patient and discussed importance of compliance;  Counseled on the importance of exercise goals with target of 150 minutes per week Discussed plans with patient for ongoing care management follow up and provided patient with direct contact information for care management team; Advised patient, providing education and rationale, to monitor blood pressure daily and record, calling PCP for findings outside established parameters;  Provided education on prescribed diet low sodium;  Screening for signs and symptoms of depression related to chronic disease state;  Assessed social determinant of health barriers;   Symptom Management: Take medications as prescribed   Attend all scheduled provider appointments Call pharmacy for medication refills 3-7 days in advance of running out of medications Attend church or other social activities Perform all self care activities independently  Perform IADL's (shopping, preparing meals, housekeeping, managing finances) independently Call provider office for new concerns or questions  check blood pressure 3 times per week choose a place to take my blood pressure (home, clinic or office, retail store) write blood pressure results in a log or diary learn about high blood pressure keep a blood pressure log take blood pressure log to all doctor appointments call doctor for signs and symptoms of high blood pressure keep all doctor appointments take medications for blood pressure exactly as prescribed report new symptoms to your doctor eat more whole grains, fruits and vegetables, lean meats and healthy fats Look over education sent via my chart- low sodium diet  Follow Up Plan: Telephone follow up appointment with care management team member scheduled for:  09/18/22 at 9 am       CCM Expected Outcome:  Monitor, Self-Manage and Reduce Symptoms of:          Plan:Telephone follow up appointment with care management team member scheduled for:  09/18/22 at 9  am  Jacqlyn Larsen [facility], BSN RN Case Manager [place] [phone]

## 2022-07-16 NOTE — Patient Instructions (Signed)
Please call the care guide team at [phone] if you need to cancel or reschedule your appointment.   If you are experiencing a Mental Health or [place] or need someone to talk to, please call the Suicide and Crisis Lifeline: 988 call the [place] National Suicide Prevention Lifeline: 228-620-6533 or TTY: 415-817-5753 TTY 615-853-7476) to talk to a trained counselor call 1-800-273-TALK (toll free, 24 hour hotline) go to [facility] [address] [place] [phone]) call 911   Following is a copy of the CCM Program Consent:  CCM service includes personalized support from designated clinical staff supervised by the physician, including individualized plan of care and coordination with other care providers 24/7 contact phone numbers for assistance for urgent and routine care needs. Service will only be billed when office clinical staff spend 20 minutes or more in a month to coordinate care. Only one practitioner may furnish and bill the service in a calendar month. The patient may stop CCM services at amy time (effective at the end of the month) by phone call to the office staff. The patient will be responsible for cost sharing (co-pay) or up to 20% of the service fee (after annual deductible is met)  Following is a copy of your full provider care plan:   Goals Addressed             This Visit's Progress    CCM (CHRONIC KIDNEY DISEASE) EXPECTED OUTCOME: MONITOR, SELF-MANAGE AND REDUCE SYMPTOMS OF CHRONIC KIDNEY DISEASE       Current Barriers:  Knowledge Deficits related to Chronic Kidney disease management Chronic Disease Management support and education needs related to Chronic Kidney disease No Advanced Directives in place- pt requests information be mailed Patient reports he had a stroke last year and "I'm doing so much better"  Planned Interventions: Assessed the patient     understanding of chronic kidney disease     Evaluation of current treatment plan related to chronic kidney disease self management and patient's adherence to plan as established by provider      Reviewed prescribed diet low sodium  Reviewed medications with patient and discussed importance of compliance    Counseled on the importance of exercise goals with target of 150 minutes per week     Discussed complications of poorly controlled blood pressure such as heart disease, stroke, circulatory complications, vision complications, kidney impairment, sexual dysfunction    Discussed plans with patient for ongoing care management follow up and provided patient with direct contact information for care management team    Screening for signs and symptoms of depression related to chronic disease state      Assessed social determinant of health barriers    Advanced directives packet mailed  Symptom Management: Take medications as prescribed   Attend all scheduled provider appointments Call pharmacy for medication refills 3-7 days in advance of running out of medications Attend church or other social activities Perform all self care activities independently  Perform IADL's (shopping, preparing meals, housekeeping, managing finances) independently Call provider office for new concerns or questions  Advanced directives packet mailed- please look over and complete  Follow Up Plan: Telephone follow up appointment with care management team member scheduled for: 09/18/22 at 9 am       CCM (HYPERTENSION) EXPECTED OUTCOME: MONITOR, SELF-MANAGE AND REDUCE SYMPTOMS OF HYPERTENSION       Current Barriers:  Knowledge Deficits related to Hypertension management Chronic Disease Management support and education needs related to Hypertension, diet  No Advanced Directives in place- pt requests information be mailed Patient reports he lives alone, is independent in all aspects of his care, continues to drive, pt received liver transplant in 2009 and is managed  by [facility] Patient reports he checks blood pressure several times per week and readings "are usually ok"  Planned Interventions: Evaluation of current treatment plan related to hypertension self management and patient's adherence to plan as established by provider;   Reviewed prescribed diet low sodium Reviewed medications with patient and discussed importance of compliance;  Counseled on the importance of exercise goals with target of 150 minutes per week Discussed plans with patient for ongoing care management follow up and provided patient with direct contact information for care management team; Advised patient, providing education and rationale, to monitor blood pressure daily and record, calling PCP for findings outside established parameters;  Provided education on prescribed diet low sodium;  Screening for signs and symptoms of depression related to chronic disease state;  Assessed social determinant of health barriers;   Symptom Management: Take medications as prescribed   Attend all scheduled provider appointments Call pharmacy for medication refills 3-7 days in advance of running out of medications Attend church or other social activities Perform all self care activities independently  Perform IADL's (shopping, preparing meals, housekeeping, managing finances) independently Call provider office for new concerns or questions  check blood pressure 3 times per week choose a place to take my blood pressure (home, clinic or office, retail store) write blood pressure results in a log or diary learn about high blood pressure keep a blood pressure log take blood pressure log to all doctor appointments call doctor for signs and symptoms of high blood pressure keep all doctor appointments take medications for blood pressure exactly as prescribed report new symptoms to your doctor eat more whole grains, fruits and vegetables, lean meats and healthy fats Look over education sent via my  chart- low sodium diet  Follow Up Plan: Telephone follow up appointment with care management team member scheduled for:  09/18/22 at 9 am       CCM Expected Outcome:  Monitor, Self-Manage and Reduce Symptoms of:          Patient verbalizes understanding of instructions and care plan provided today and agrees to view in [place]. Active MyChart status and patient understanding of how to access instructions and care plan via MyChart confirmed with patient.     Telephone follow up appointment with care management team member scheduled for:  09/18/22 at 9 am  Low-Sodium Eating Plan Sodium, which is an element that makes up salt, helps you maintain a healthy balance of fluids in your body. Too much sodium can increase your blood pressure and cause fluid and waste to be held in your body. Your health care provider or dietitian may recommend following this plan if you have high blood pressure (hypertension), kidney disease, liver disease, or heart failure. Eating less sodium can help lower your blood pressure, reduce swelling, and protect your heart, liver, and kidneys. What are tips for following this plan? Reading food labels The Nutrition Facts label lists the amount of sodium in one serving of the food. If you eat more than one serving, you must multiply the listed amount of sodium by the number of servings. Choose foods with less than 140 mg of sodium per serving. Avoid foods with 300 mg of sodium or more per serving. Shopping  Look for lower-sodium products, often labeled as "low-sodium" or "no salt  added." Always check the sodium content, even if foods are labeled as "unsalted" or "no salt added." Buy fresh foods. Avoid canned foods and pre-made or frozen meals. Avoid canned, cured, or processed meats. Buy breads that have less than 80 mg of sodium per slice. Cooking  Eat more home-cooked food and less restaurant, buffet, and fast food. Avoid adding salt when cooking. Use salt-free  seasonings or herbs instead of table salt or sea salt. Check with your health care provider or pharmacist before using salt substitutes. Cook with plant-based oils, such as canola, sunflower, or olive oil. Meal planning When eating at a restaurant, ask that your food be prepared with less salt or no salt, if possible. Avoid dishes labeled as brined, pickled, cured, smoked, or made with soy sauce, miso, or teriyaki sauce. Avoid foods that contain MSG (monosodium glutamate). MSG is sometimes added to [place] food, bouillon, and some canned foods. Make meals that can be grilled, baked, poached, roasted, or steamed. These are generally made with less sodium. General information Most people on this plan should limit their sodium intake to 1,500-2,000 mg (milligrams) of sodium each day. What foods should I eat? Fruits Fresh, frozen, or canned fruit. Fruit juice. Vegetables Fresh or frozen vegetables. "No salt added" canned vegetables. "No salt added" tomato sauce and paste. Low-sodium or reduced-sodium tomato and vegetable juice. Grains Low-sodium cereals, including oats, puffed wheat and rice, and shredded wheat. Low-sodium crackers. Unsalted rice. Unsalted pasta. Low-sodium bread. Whole-grain breads and whole-grain pasta. Meats and other proteins Fresh or frozen (no salt added) meat, poultry, seafood, and fish. Low-sodium canned tuna and salmon. Unsalted nuts. Dried peas, beans, and lentils without added salt. Unsalted canned beans. Eggs. Unsalted nut butters. Dairy Milk. Soy milk. Cheese that is naturally low in sodium, such as ricotta cheese, fresh mozzarella, or Swiss cheese. Low-sodium or reduced-sodium cheese. Cream cheese. Yogurt. Seasonings and condiments Fresh and dried herbs and spices. Salt-free seasonings. Low-sodium mustard and ketchup. Sodium-free salad dressing. Sodium-free light mayonnaise. Fresh or refrigerated horseradish. Lemon juice. Vinegar. Other foods Homemade, reduced-sodium,  or low-sodium soups. Unsalted popcorn and pretzels. Low-salt or salt-free chips. The items listed above may not be a complete list of foods and beverages you can eat. Contact a dietitian for more information. What foods should I avoid? Vegetables Sauerkraut, pickled vegetables, and relishes. Olives. [place] fries. Onion rings. Regular canned vegetables (not low-sodium or reduced-sodium). Regular canned tomato sauce and paste (not low-sodium or reduced-sodium). Regular tomato and vegetable juice (not low-sodium or reduced-sodium). Frozen vegetables in sauces. Grains Instant hot cereals. Bread stuffing, pancake, and biscuit mixes. Croutons. Seasoned rice or pasta mixes. Noodle soup cups. Boxed or frozen macaroni and cheese. Regular salted crackers. Self-rising flour. Meats and other proteins Meat or fish that is salted, canned, smoked, spiced, or pickled. Precooked or cured meat, such as sausages or meat loaves. Berniece Salines. Ham. Pepperoni. Hot dogs. Corned beef. Chipped beef. Salt pork. Jerky. Pickled herring. Anchovies and sardines. Regular canned tuna. Salted nuts. Dairy Processed cheese and cheese spreads. Hard cheeses. Cheese curds. Blue cheese. Feta cheese. String cheese. Regular cottage cheese. Buttermilk. Canned milk. Fats and oils Salted butter. Regular margarine. Ghee. Bacon fat. Seasonings and condiments Onion salt, garlic salt, seasoned salt, table salt, and sea salt. Canned and packaged gravies. Worcestershire sauce. Tartar sauce. Barbecue sauce. Teriyaki sauce. Soy sauce, including reduced-sodium. Steak sauce. Fish sauce. Oyster sauce. Cocktail sauce. Horseradish that you find on the shelf. Regular ketchup and mustard. Meat flavorings and tenderizers. Bouillon cubes. Hot sauce.  Pre-made or packaged marinades. Pre-made or packaged taco seasonings. Relishes. Regular salad dressings. Salsa. Other foods Salted popcorn and pretzels. Corn chips and puffs. Potato and tortilla chips. Canned or dried  soups. Pizza. Frozen entrees and pot pies. The items listed above may not be a complete list of foods and beverages you should avoid. Contact a dietitian for more information. Summary Eating less sodium can help lower your blood pressure, reduce swelling, and protect your heart, liver, and kidneys. Most people on this plan should limit their sodium intake to 1,500-2,000 mg (milligrams) of sodium each day. Canned, boxed, and frozen foods are high in sodium. Restaurant foods, fast foods, and pizza are also very high in sodium. You also get sodium by adding salt to food. Try to cook at home, eat more fresh fruits and vegetables, and eat less fast food and canned, processed, or prepared foods. This information is not intended to replace advice given to you by your health care provider. Make sure you discuss any questions you have with your health care provider. Document Revised: 07/09/2019 Document Reviewed: 05/05/2019 Elsevier Patient Education  [place].

## 2022-07-16 NOTE — Plan of Care (Signed)
Chronic Care Management Provider Comprehensive Care Plan    07/16/2022 Name: Devin White MRN: [id number] DOB: December 21, 1955  Referral to Chronic Care Management (CCM) services was placed by Provider:  Viviana Simpler MD on Date: 07/16/22.  Chronic Condition 1: HYPERTENSION Provider Assessment and Plan  Problem: Hypertension  Editor: Venia Carbon, MD (Physician)                 BP Readings from Last 3 Encounters:  11/14/21 128/78  11/07/21 114/76  10/12/21 104/70    Had been high at times in hospital but better now On amlodipine 5 and lisinopril '5mg'$  daily Hasn't needed the furosemide       Expected Outcome/Goals Addressed This Visit (Provider CCM goals/Provider Assessment and plan  CCM (HYPERTENSION) EXPECTED OUTCOME: MONITOR, SELF-MANAGE AND REDUCE SYMPTOMS OF HYPERTENSION  Symptom Management Condition 1: Take medications as prescribed   Attend all scheduled provider appointments Call pharmacy for medication refills 3-7 days in advance of running out of medications Attend church or other social activities Perform all self care activities independently  Perform IADL's (shopping, preparing meals, housekeeping, managing finances) independently Call provider office for new concerns or questions  check blood pressure 3 times per week choose a place to take my blood pressure (home, clinic or office, retail store) write blood pressure results in a log or diary learn about high blood pressure keep a blood pressure log take blood pressure log to all doctor appointments call doctor for signs and symptoms of high blood pressure keep all doctor appointments take medications for blood pressure exactly as prescribed report new symptoms to your doctor eat more whole grains, fruits and vegetables, lean meats and healthy fats Look over education sent via my chart- low sodium diet  Chronic Condition 2: CKD stage 3 Provider Assessment and Plan   Problem: Stage 3b chronic kidney  disease ([place])  Editor: Venia Carbon, MD (Physician)              Last GFR 35 Asked him to restart the lisinopril '5mg'$       Expected Outcome/Goals Addressed This Visit (Provider CCM goals/Provider Assessment and plan  CCM (CHRONIC KIDNEY DISEASE) EXPECTED OUTCOME: MONITOR, SELF-MANAGE AND REDUCE SYMPTOMS OF CHRONIC KIDNEY DISEASE  Symptom Management Condition 2: Take medications as prescribed   Attend all scheduled provider appointments Call pharmacy for medication refills 3-7 days in advance of running out of medications Attend church or other social activities Perform all self care activities independently  Perform IADL's (shopping, preparing meals, housekeeping, managing finances) independently Call provider office for new concerns or questions  Advanced directives packet mailed- please look over and complete  Problem List Patient Active Problem List   Diagnosis Date Noted   Acute pyelonephritis 01/31/2022   Sepsis ([place]) 47/42/5956   Complicated UTI (urinary tract infection) 01/30/2022   Hypokalemia 12/27/2021   Severe sepsis ([place]) 12/26/2021   Acute UTI (urinary tract infection) 12/26/2021   Left sided numbness 11/14/2021   Obesity (BMI 30-39.9) 10/27/2021   Diarrhea 10/26/2021   Stage 3a chronic kidney disease (CKD) ([place]) 10/26/2021   Hyperbilirubinemia 10/26/2021   Hyperglycemia 10/26/2021   Chronic diastolic heart failure ([place]) 09/05/2021   Achilles tendon mass 09/25/2020   Joint pain 09/22/2020   Recurrent UTI 03/26/2019   Pulmonary nodule 08/04/2018   Liver transplant status ([place]) 10/10/2017   Advance directive discussed with patient 10/10/2017   Stage 3b chronic kidney disease ([place]) 10/09/2016   Mallory-Weiss tear 04/16/2016   Long-term use of immunosuppressant  medication 04/08/2016   Sleep apnea 10/06/2015   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   Immunosuppression ([place]) 06/01/2012   Hypertension    Routine general  medical examination at a health care facility 04/26/2011   Chronic tophaceous gout 12/21/2008   BPH without urinary obstruction 12/21/2008   ALLERGIC RHINITIS 05/22/2007   GERD 05/22/2007   HIATAL HERNIA 05/22/2007   IRRITABLE BOWEL SYNDROME, HX OF 05/22/2007   RENAL CALCULUS, HX OF 05/22/2007    Medication Management  Current Outpatient Medications:    acetaminophen (TYLENOL) 325 MG tablet, Take 650 mg by mouth every 6 (six) hours as needed for mild pain., Disp: , Rfl:    amLODipine (NORVASC) 5 MG tablet, Take 5 mg by mouth daily., Disp: , Rfl:    ascorbic acid (VITAMIN C) 500 MG tablet, Take 1 tablet (500 mg total) by mouth daily., Disp: , Rfl:    aspirin 325 MG tablet, Take 1 tablet (325 mg total) by mouth daily., Disp: 30 tablet, Rfl: 3   calcium carbonate (TUMS) 500 MG chewable tablet, Chew 1 tablet (200 mg of elemental calcium total) by mouth 2 (two) times daily as needed for indigestion or heartburn., Disp: 180 tablet, Rfl: 1   furosemide (LASIX) 40 MG tablet, Take 1 tablet (40 mg total) by mouth daily. (Patient taking differently: Take 40 mg by mouth daily. Pt states as needed), Disp: 90 tablet, Rfl: 3   Multiple Vitamin (MULTIVITAMIN WITH MINERALS) TABS tablet, Take 1 tablet by mouth daily., Disp: , Rfl:    mycophenolate (CELLCEPT) 250 MG capsule, Take 250-500 mg by mouth 2 (two) times daily. 2 capsules (500 mg) in the morning and one capsule (250 mg) in the evening, Disp: , Rfl:    omeprazole (PRILOSEC) 20 MG capsule, Take 1 capsule (20 mg total) by mouth daily., Disp: 30 capsule, Rfl: 11   predniSONE (DELTASONE) 10 MG tablet, Take 10 mg by mouth daily with breakfast., Disp: , Rfl:    tacrolimus (PROGRAF) 0.5 MG capsule, Take 0.5-1 mg by mouth See admin instructions. Taking 0.5 mg in the morning and 1 mg at bedtime, Disp: , Rfl:    tamsulosin (FLOMAX) 0.4 MG CAPS capsule, TAKE 1 CAPSULE(0.4 MG) BY MOUTH DAILY, Disp: 90 capsule, Rfl: 3   trimethoprim (TRIMPEX) 100 MG tablet, Take 1  tablet (100 mg total) by mouth daily. To prevent UTI, Disp: 30 tablet, Rfl: 5   vitamin A 3 MG (10000 UNITS) capsule, Take 1 capsule (10,000 Units total) by mouth daily., Disp: 30 capsule, Rfl:    zinc sulfate 220 (50 Zn) MG capsule, Take 1 capsule (220 mg total) by mouth daily., Disp: , Rfl:    colchicine 0.6 MG tablet, Take 1 tablet (0.6 mg total) by mouth daily as needed. (Patient not taking: Reported on 05/06/2022), Disp: 90 tablet, Rfl: 3   lisinopril (ZESTRIL) 5 MG tablet, Take 5 mg by mouth daily. (Patient not taking: Reported on 05/06/2022), Disp: , Rfl:    rosuvastatin (CRESTOR) 5 MG tablet, Take 1 tablet (5 mg total) by mouth every evening. (Patient not taking: Reported on 05/06/2022), Disp: 30 tablet, Rfl: 0   senna (SENOKOT) 8.6 MG TABS tablet, Take 2 tablets (17.2 mg total) by mouth daily., Disp: 120 tablet, Rfl: 0   SUMAtriptan (IMITREX) 100 MG tablet, Take 1 tablet (100 mg total) by mouth once as needed for migraine (may repeat x 1 in 24 hrs not more than 2 days per week. total 9 tablets). May repeat in  2 hours if headache persists or recurs. (Patient not taking: Reported on 05/06/2022), Disp: 9 tablet, Rfl: 0  Cognitive Assessment Identity Confirmed: : Name; DOB Cognitive Status: Normal Other:  : N/A   Functional Assessment Hearing Difficulty or Deaf: no Wear Glasses or Blind: no Concentrating, Remembering or Making Decisions Difficulty (CP): no Difficulty Communicating: no Difficulty Eating/Swallowing: no Walking or Climbing Stairs Difficulty: no Dressing/Bathing Difficulty: no Doing Errands Independently Difficulty (such as shopping) (CP): no   Caregiver Assessment  Primary Source of Support/Comfort: child(ren) Name of Support/Comfort Primary Source: daughter Johnathan Heskett People in Home: alone   Planned Interventions  Evaluation of current treatment plan related to hypertension self management and patient's adherence to plan as established by provider;    Reviewed prescribed diet low sodium Reviewed medications with patient and discussed importance of compliance;  Counseled on the importance of exercise goals with target of 150 minutes per week Discussed plans with patient for ongoing care management follow up and provided patient with direct contact information for care management team; Advised patient, providing education and rationale, to monitor blood pressure daily and record, calling PCP for findings outside established parameters;  Provided education on prescribed diet low sodium;  Screening for signs and symptoms of depression related to chronic disease state;  Assessed social determinant of health barriers;  Assessed the patient     understanding of chronic kidney disease    Evaluation of current treatment plan related to chronic kidney disease self management and patient's adherence to plan as established by provider      Reviewed prescribed diet low sodium  Reviewed medications with patient and discussed importance of compliance    Counseled on the importance of exercise goals with target of 150 minutes per week     Discussed complications of poorly controlled blood pressure such as heart disease, stroke, circulatory complications, vision complications, kidney impairment, sexual dysfunction    Discussed plans with patient for ongoing care management follow up and provided patient with direct contact information for care management team    Screening for signs and symptoms of depression related to chronic disease state      Assessed social determinant of health barriers    Advanced directives packet mailed  Interaction and coordination with outside resources, practitioners, and providers See CCM Referral  Care Plan: Available in [place]

## 2022-07-17 DIAGNOSIS — I129 Hypertensive chronic kidney disease with stage 1 through stage 4 chronic kidney disease, or unspecified chronic kidney disease: Secondary | ICD-10-CM | POA: Diagnosis not present

## 2022-07-17 DIAGNOSIS — N1831 Chronic kidney disease, stage 3a: Secondary | ICD-10-CM

## 2022-07-21 ENCOUNTER — Other Ambulatory Visit: Payer: Self-pay | Admitting: Internal Medicine

## 2022-09-18 ENCOUNTER — Other Ambulatory Visit: Payer: Self-pay | Admitting: Internal Medicine

## 2022-09-18 ENCOUNTER — Ambulatory Visit (INDEPENDENT_AMBULATORY_CARE_PROVIDER_SITE_OTHER): Payer: 59 | Admitting: *Deleted

## 2022-09-18 DIAGNOSIS — I10 Essential (primary) hypertension: Secondary | ICD-10-CM

## 2022-09-18 DIAGNOSIS — N1831 Chronic kidney disease, stage 3a: Secondary | ICD-10-CM

## 2022-09-18 NOTE — Patient Instructions (Signed)
Please call the care guide team at [phone] if you need to cancel or reschedule your appointment.   If you are experiencing a Mental Health or [place] or need someone to talk to, please call the Suicide and Crisis Lifeline: 988 call the [place] National Suicide Prevention Lifeline: 403-777-4522 or TTY: 639-351-1229 TTY 6391792451) to talk to a trained counselor call 1-800-273-TALK (toll free, 24 hour hotline) go to [facility] [address] [place] [phone]) call 911   Following is a copy of the CCM Program Consent:  CCM service includes personalized support from designated clinical staff supervised by the physician, including individualized plan of care and coordination with other care providers 24/7 contact phone numbers for assistance for urgent and routine care needs. Service will only be billed when office clinical staff spend 20 minutes or more in a month to coordinate care. Only one practitioner may furnish and bill the service in a calendar month. The patient may stop CCM services at amy time (effective at the end of the month) by phone call to the office staff. The patient will be responsible for cost sharing (co-pay) or up to 20% of the service fee (after annual deductible is met)  Following is a copy of your full provider care plan:   Goals Addressed             This Visit's Progress    CCM (CHRONIC KIDNEY DISEASE) EXPECTED OUTCOME: MONITOR, SELF-MANAGE AND REDUCE SYMPTOMS OF CHRONIC KIDNEY DISEASE       Current Barriers:  Knowledge Deficits related to Chronic Kidney disease management Chronic Disease Management support and education needs related to Chronic Kidney disease No Advanced Directives in place- pt states he did not receive documents, requests to be remailed Patient reports he had a stroke last year and "I'm doing so much better"  Planned Interventions: Assessed the patient     understanding of  chronic kidney disease    Evaluation of current treatment plan related to chronic kidney disease self management and patient's adherence to plan as established by provider      Reviewed medications with patient and discussed importance of compliance    Counseled on the importance of exercise goals with target of 150 minutes per week     Discussed complications of poorly controlled blood pressure such as heart disease, stroke, circulatory complications, vision complications, kidney impairment, sexual dysfunction    Advanced directives packet mailed Reinforced low sodium diet and importance of reading labels  Symptom Management: Take medications as prescribed   Attend all scheduled provider appointments Call pharmacy for medication refills 3-7 days in advance of running out of medications Attend church or other social activities Perform all self care activities independently  Perform IADL's (shopping, preparing meals, housekeeping, managing finances) independently Call provider office for new concerns or questions  Advanced directives packet mailed- please look over and complete Read food labels for sodium content  Follow Up Plan: Telephone follow up appointment with care management team member scheduled for:  12/12/22 at 130 pm       CCM (HYPERTENSION) EXPECTED OUTCOME: MONITOR, SELF-MANAGE AND REDUCE SYMPTOMS OF HYPERTENSION       Current Barriers:  Knowledge Deficits related to Hypertension management Chronic Disease Management support and education needs related to Hypertension, diet No Advanced Directives in place- pt requests information be mailed Patient reports he lives alone, is independent in all aspects of his care, continues to drive, pt received liver transplant in 2009 and is  managed by [facility], states had issues with his cell phone and did not know it was time for lab work, states he is to follow up at [facility] this month Patient reports he checks blood pressure several times per  week and readings "are usually ok"  Planned Interventions: Evaluation of current treatment plan related to hypertension self management and patient's adherence to plan as established by provider;   Reviewed medications with patient and discussed importance of compliance;  Counseled on the importance of exercise goals with target of 150 minutes per week Discussed plans with patient for ongoing care management follow up and provided patient with direct contact information for care management team; Advised patient, providing education and rationale, to monitor blood pressure daily and record, calling PCP for findings outside established parameters;  Advised patient to discuss any issues with blood pressure, medications, change in health status with provider; Discussed complications of poorly controlled blood pressure such as heart disease, stroke, circulatory complications, vision complications, kidney impairment, sexual dysfunction;   Symptom Management: Take medications as prescribed   Attend all scheduled provider appointments Call pharmacy for medication refills 3-7 days in advance of running out of medications Attend church or other social activities Perform all self care activities independently  Perform IADL's (shopping, preparing meals, housekeeping, managing finances) independently Call provider office for new concerns or questions  check blood pressure 3 times per week choose a place to take my blood pressure (home, clinic or office, retail store) write blood pressure results in a log or diary learn about high blood pressure keep a blood pressure log take blood pressure log to all doctor appointments call doctor for signs and symptoms of high blood pressure develop an action plan for high blood pressure keep all doctor appointments take medications for blood pressure exactly as prescribed report new symptoms to your doctor eat more whole grains, fruits and vegetables, lean meats  and healthy fats Follow low sodium diet- avoid/ limit fast food Follow up with [facility] this month- stay up to date on all blood work  Follow Up Plan: Telephone follow up appointment with care management team member scheduled for:  12/12/22 at 130 pm          Patient verbalizes understanding of instructions and care plan provided today and agrees to view in [place]. Active MyChart status and patient understanding of how to access instructions and care plan via MyChart confirmed with patient.     Telephone follow up appointment with care management team member scheduled for:  12/12/22 at 130 pm

## 2022-09-18 NOTE — Chronic Care Management (AMB) (Signed)
Chronic Care Management   CCM RN Visit Note  09/18/2022 Name: Devin White MRN: [id number] DOB: 1956/01/19  Subjective: Devin White is a 67 y.o. year old male who is a primary care patient of Venia Carbon, MD. The patient was referred to the Chronic Care Management team for assistance with care management needs subsequent to provider initiation of CCM services and plan of care.    Today's Visit:  Engaged with patient by telephone for follow up visit.        Goals Addressed             This Visit's Progress    CCM (CHRONIC KIDNEY DISEASE) EXPECTED OUTCOME: MONITOR, SELF-MANAGE AND REDUCE SYMPTOMS OF CHRONIC KIDNEY DISEASE       Current Barriers:  Knowledge Deficits related to Chronic Kidney disease management Chronic Disease Management support and education needs related to Chronic Kidney disease No Advanced Directives in place- pt states he did not receive documents, requests to be remailed Patient reports he had a stroke last year and "I'm doing so much better"  Planned Interventions: Assessed the patient     understanding of chronic kidney disease    Evaluation of current treatment plan related to chronic kidney disease self management and patient's adherence to plan as established by provider      Reviewed medications with patient and discussed importance of compliance    Counseled on the importance of exercise goals with target of 150 minutes per week     Discussed complications of poorly controlled blood pressure such as heart disease, stroke, circulatory complications, vision complications, kidney impairment, sexual dysfunction    Advanced directives packet mailed Reinforced low sodium diet and importance of reading labels  Symptom Management: Take medications as prescribed   Attend all scheduled provider appointments Call pharmacy for medication refills 3-7 days in advance of running out of medications Attend church or other social activities Perform all  self care activities independently  Perform IADL's (shopping, preparing meals, housekeeping, managing finances) independently Call provider office for new concerns or questions  Advanced directives packet mailed- please look over and complete Read food labels for sodium content  Follow Up Plan: Telephone follow up appointment with care management team member scheduled for:  12/12/22 at 130 pm       CCM (HYPERTENSION) EXPECTED OUTCOME: MONITOR, SELF-MANAGE AND REDUCE SYMPTOMS OF HYPERTENSION       Current Barriers:  Knowledge Deficits related to Hypertension management Chronic Disease Management support and education needs related to Hypertension, diet No Advanced Directives in place- pt requests information be mailed Patient reports he lives alone, is independent in all aspects of his care, continues to drive, pt received liver transplant in 2009 and is managed by [facility], states had issues with his cell phone and did not know it was time for lab work, states he is to follow up at [facility] this month Patient reports he checks blood pressure several times per week and readings "are usually ok"  Planned Interventions: Evaluation of current treatment plan related to hypertension self management and patient's adherence to plan as established by provider;   Reviewed medications with patient and discussed importance of compliance;  Counseled on the importance of exercise goals with target of 150 minutes per week Discussed plans with patient for ongoing care management follow up and provided patient with direct contact information for care management team; Advised patient, providing education and rationale, to monitor blood pressure daily and record, calling PCP for findings outside established  parameters;  Advised patient to discuss any issues with blood pressure, medications, change in health status with provider; Discussed complications of poorly controlled blood pressure such as heart disease, stroke,  circulatory complications, vision complications, kidney impairment, sexual dysfunction;   Symptom Management: Take medications as prescribed   Attend all scheduled provider appointments Call pharmacy for medication refills 3-7 days in advance of running out of medications Attend church or other social activities Perform all self care activities independently  Perform IADL's (shopping, preparing meals, housekeeping, managing finances) independently Call provider office for new concerns or questions  check blood pressure 3 times per week choose a place to take my blood pressure (home, clinic or office, retail store) write blood pressure results in a log or diary learn about high blood pressure keep a blood pressure log take blood pressure log to all doctor appointments call doctor for signs and symptoms of high blood pressure develop an action plan for high blood pressure keep all doctor appointments take medications for blood pressure exactly as prescribed report new symptoms to your doctor eat more whole grains, fruits and vegetables, lean meats and healthy fats Follow low sodium diet- avoid/ limit fast food Follow up with [facility] this month- stay up to date on all blood work  Follow Up Plan: Telephone follow up appointment with care management team member scheduled for:  12/12/22 at 130 pm          Plan:Telephone follow up appointment with care management team member scheduled for:  12/12/22 at 130 pm  Jacqlyn Larsen [facility], BSN RN Case Manager [place] [phone]

## 2022-09-19 ENCOUNTER — Other Ambulatory Visit (HOSPITAL_COMMUNITY): Payer: Self-pay

## 2022-09-19 ENCOUNTER — Other Ambulatory Visit: Payer: Self-pay

## 2022-09-19 MED ORDER — LISINOPRIL 5 MG PO TABS
5.0000 mg | ORAL_TABLET | Freq: Every day | ORAL | 0 refills | Status: DC
  Filled 2022-09-19 – 2022-10-21 (×2): qty 90, 90d supply, fill #0

## 2022-10-12 ENCOUNTER — Other Ambulatory Visit: Payer: Self-pay | Admitting: Internal Medicine

## 2022-10-14 ENCOUNTER — Other Ambulatory Visit: Payer: Self-pay | Admitting: Internal Medicine

## 2022-10-14 DIAGNOSIS — K754 Autoimmune hepatitis: Secondary | ICD-10-CM | POA: Diagnosis not present

## 2022-10-14 DIAGNOSIS — T8643 Liver transplant infection: Secondary | ICD-10-CM | POA: Diagnosis not present

## 2022-10-14 DIAGNOSIS — Z944 Liver transplant status: Secondary | ICD-10-CM | POA: Diagnosis not present

## 2022-10-14 DIAGNOSIS — D849 Immunodeficiency, unspecified: Secondary | ICD-10-CM | POA: Diagnosis not present

## 2022-10-15 DIAGNOSIS — N1831 Chronic kidney disease, stage 3a: Secondary | ICD-10-CM

## 2022-10-15 DIAGNOSIS — I129 Hypertensive chronic kidney disease with stage 1 through stage 4 chronic kidney disease, or unspecified chronic kidney disease: Secondary | ICD-10-CM | POA: Diagnosis not present

## 2022-10-22 ENCOUNTER — Other Ambulatory Visit: Payer: Self-pay

## 2022-10-29 DIAGNOSIS — Z944 Liver transplant status: Secondary | ICD-10-CM | POA: Diagnosis not present

## 2022-10-29 DIAGNOSIS — D849 Immunodeficiency, unspecified: Secondary | ICD-10-CM | POA: Diagnosis not present

## 2022-11-05 ENCOUNTER — Inpatient Hospital Stay
Admission: EM | Admit: 2022-11-05 | Discharge: 2022-11-11 | DRG: 062 | Disposition: A | Discharge status: Home / Self Care | Payer: 59 | Attending: Internal Medicine | Admitting: Internal Medicine

## 2022-11-05 ENCOUNTER — Other Ambulatory Visit: Payer: Self-pay

## 2022-11-05 ENCOUNTER — Emergency Department: Payer: 59

## 2022-11-05 ENCOUNTER — Encounter: Payer: Self-pay | Admitting: Intensive Care

## 2022-11-05 DIAGNOSIS — R102 Pelvic and perineal pain: Secondary | ICD-10-CM | POA: Diagnosis not present

## 2022-11-05 DIAGNOSIS — D84821 Immunodeficiency due to drugs: Secondary | ICD-10-CM | POA: Diagnosis not present

## 2022-11-05 DIAGNOSIS — Z833 Family history of diabetes mellitus: Secondary | ICD-10-CM

## 2022-11-05 DIAGNOSIS — N281 Cyst of kidney, acquired: Secondary | ICD-10-CM | POA: Diagnosis not present

## 2022-11-05 DIAGNOSIS — K219 Gastro-esophageal reflux disease without esophagitis: Secondary | ICD-10-CM | POA: Diagnosis not present

## 2022-11-05 DIAGNOSIS — J3489 Other specified disorders of nose and nasal sinuses: Secondary | ICD-10-CM | POA: Diagnosis not present

## 2022-11-05 DIAGNOSIS — R29706 NIHSS score 6: Secondary | ICD-10-CM | POA: Diagnosis not present

## 2022-11-05 DIAGNOSIS — R29810 Facial weakness: Secondary | ICD-10-CM | POA: Diagnosis present

## 2022-11-05 DIAGNOSIS — I130 Hypertensive heart and chronic kidney disease with heart failure and stage 1 through stage 4 chronic kidney disease, or unspecified chronic kidney disease: Secondary | ICD-10-CM | POA: Diagnosis present

## 2022-11-05 DIAGNOSIS — I6529 Occlusion and stenosis of unspecified carotid artery: Secondary | ICD-10-CM | POA: Diagnosis not present

## 2022-11-05 DIAGNOSIS — Z6835 Body mass index (BMI) 35.0-35.9, adult: Secondary | ICD-10-CM

## 2022-11-05 DIAGNOSIS — E669 Obesity, unspecified: Secondary | ICD-10-CM | POA: Diagnosis present

## 2022-11-05 DIAGNOSIS — I10 Essential (primary) hypertension: Secondary | ICD-10-CM | POA: Diagnosis present

## 2022-11-05 DIAGNOSIS — M533 Sacrococcygeal disorders, not elsewhere classified: Secondary | ICD-10-CM | POA: Diagnosis not present

## 2022-11-05 DIAGNOSIS — R3911 Hesitancy of micturition: Secondary | ICD-10-CM | POA: Diagnosis not present

## 2022-11-05 DIAGNOSIS — Z841 Family history of disorders of kidney and ureter: Secondary | ICD-10-CM

## 2022-11-05 DIAGNOSIS — Z981 Arthrodesis status: Secondary | ICD-10-CM

## 2022-11-05 DIAGNOSIS — Z87440 Personal history of urinary (tract) infections: Secondary | ICD-10-CM

## 2022-11-05 DIAGNOSIS — I639 Cerebral infarction, unspecified: Secondary | ICD-10-CM | POA: Diagnosis not present

## 2022-11-05 DIAGNOSIS — R7989 Other specified abnormal findings of blood chemistry: Secondary | ICD-10-CM | POA: Diagnosis not present

## 2022-11-05 DIAGNOSIS — G43909 Migraine, unspecified, not intractable, without status migrainosus: Secondary | ICD-10-CM | POA: Diagnosis present

## 2022-11-05 DIAGNOSIS — Z8261 Family history of arthritis: Secondary | ICD-10-CM

## 2022-11-05 DIAGNOSIS — M545 Low back pain, unspecified: Secondary | ICD-10-CM | POA: Diagnosis not present

## 2022-11-05 DIAGNOSIS — Z8673 Personal history of transient ischemic attack (TIA), and cerebral infarction without residual deficits: Secondary | ICD-10-CM

## 2022-11-05 DIAGNOSIS — N1832 Chronic kidney disease, stage 3b: Secondary | ICD-10-CM | POA: Diagnosis present

## 2022-11-05 DIAGNOSIS — R29818 Other symptoms and signs involving the nervous system: Secondary | ICD-10-CM | POA: Diagnosis not present

## 2022-11-05 DIAGNOSIS — Z8249 Family history of ischemic heart disease and other diseases of the circulatory system: Secondary | ICD-10-CM | POA: Diagnosis not present

## 2022-11-05 DIAGNOSIS — N419 Inflammatory disease of prostate, unspecified: Secondary | ICD-10-CM | POA: Diagnosis present

## 2022-11-05 DIAGNOSIS — A419 Sepsis, unspecified organism: Secondary | ICD-10-CM | POA: Diagnosis not present

## 2022-11-05 DIAGNOSIS — Z7952 Long term (current) use of systemic steroids: Secondary | ICD-10-CM | POA: Diagnosis not present

## 2022-11-05 DIAGNOSIS — R519 Headache, unspecified: Secondary | ICD-10-CM | POA: Diagnosis not present

## 2022-11-05 DIAGNOSIS — R202 Paresthesia of skin: Secondary | ICD-10-CM | POA: Diagnosis not present

## 2022-11-05 DIAGNOSIS — R079 Chest pain, unspecified: Secondary | ICD-10-CM | POA: Diagnosis not present

## 2022-11-05 DIAGNOSIS — Z811 Family history of alcohol abuse and dependence: Secondary | ICD-10-CM

## 2022-11-05 DIAGNOSIS — Z944 Liver transplant status: Secondary | ICD-10-CM | POA: Diagnosis not present

## 2022-11-05 DIAGNOSIS — R3912 Poor urinary stream: Secondary | ICD-10-CM | POA: Diagnosis not present

## 2022-11-05 DIAGNOSIS — G8194 Hemiplegia, unspecified affecting left nondominant side: Secondary | ICD-10-CM | POA: Diagnosis present

## 2022-11-05 DIAGNOSIS — N401 Enlarged prostate with lower urinary tract symptoms: Secondary | ICD-10-CM | POA: Diagnosis not present

## 2022-11-05 DIAGNOSIS — N390 Urinary tract infection, site not specified: Principal | ICD-10-CM | POA: Diagnosis present

## 2022-11-05 DIAGNOSIS — R0602 Shortness of breath: Secondary | ICD-10-CM | POA: Diagnosis not present

## 2022-11-05 DIAGNOSIS — Z7982 Long term (current) use of aspirin: Secondary | ICD-10-CM

## 2022-11-05 DIAGNOSIS — Z800 Family history of malignant neoplasm of digestive organs: Secondary | ICD-10-CM

## 2022-11-05 DIAGNOSIS — Z79624 Long term (current) use of inhibitors of nucleotide synthesis: Secondary | ICD-10-CM

## 2022-11-05 DIAGNOSIS — M1A9XX1 Chronic gout, unspecified, with tophus (tophi): Secondary | ICD-10-CM | POA: Diagnosis not present

## 2022-11-05 DIAGNOSIS — N410 Acute prostatitis: Secondary | ICD-10-CM | POA: Diagnosis not present

## 2022-11-05 DIAGNOSIS — I5032 Chronic diastolic (congestive) heart failure: Secondary | ICD-10-CM | POA: Diagnosis not present

## 2022-11-05 DIAGNOSIS — R109 Unspecified abdominal pain: Secondary | ICD-10-CM | POA: Diagnosis not present

## 2022-11-05 DIAGNOSIS — R200 Anesthesia of skin: Secondary | ICD-10-CM | POA: Diagnosis not present

## 2022-11-05 DIAGNOSIS — M4802 Spinal stenosis, cervical region: Secondary | ICD-10-CM | POA: Diagnosis not present

## 2022-11-05 DIAGNOSIS — I6389 Other cerebral infarction: Secondary | ICD-10-CM | POA: Diagnosis not present

## 2022-11-05 DIAGNOSIS — I6621 Occlusion and stenosis of right posterior cerebral artery: Secondary | ICD-10-CM | POA: Diagnosis not present

## 2022-11-05 DIAGNOSIS — N200 Calculus of kidney: Secondary | ICD-10-CM | POA: Diagnosis not present

## 2022-11-05 DIAGNOSIS — Z79899 Other long term (current) drug therapy: Secondary | ICD-10-CM

## 2022-11-05 DIAGNOSIS — N400 Enlarged prostate without lower urinary tract symptoms: Secondary | ICD-10-CM | POA: Diagnosis present

## 2022-11-05 HISTORY — DX: Heart failure, unspecified: I50.9

## 2022-11-05 HISTORY — DX: Cerebral infarction, unspecified: I63.9

## 2022-11-05 HISTORY — DX: Disorder of kidney and ureter, unspecified: N28.9

## 2022-11-05 LAB — COMPREHENSIVE METABOLIC PANEL
ALT: 120 U/L — ABNORMAL HIGH (ref 0–44)
AST: 56 U/L — ABNORMAL HIGH (ref 15–41)
Albumin: 4.2 g/dL (ref 3.5–5.0)
Alkaline Phosphatase: 60 U/L (ref 38–126)
Anion gap: 11 (ref 5–15)
BUN: 20 mg/dL (ref 8–23)
CO2: 23 mmol/L (ref 22–32)
Calcium: 8.9 mg/dL (ref 8.9–10.3)
Chloride: 101 mmol/L (ref 98–111)
Creatinine, Ser: 1.81 mg/dL — ABNORMAL HIGH (ref 0.61–1.24)
GFR, Estimated: 41 mL/min — ABNORMAL LOW (ref >60)
Glucose, Bld: 333 mg/dL — ABNORMAL HIGH (ref 70–99)
Potassium: 4.4 mmol/L (ref 3.5–5.1)
Sodium: 135 mmol/L (ref 135–145)
Total Bilirubin: 1.6 mg/dL — ABNORMAL HIGH (ref 0.3–1.2)
Total Protein: 6.7 g/dL (ref 6.5–8.1)

## 2022-11-05 LAB — URINALYSIS, W/ REFLEX TO CULTURE (INFECTION SUSPECTED)
Bilirubin Urine: NEGATIVE
Glucose, UA: 150 mg/dL — AB
Hgb urine dipstick: NEGATIVE
Ketones, ur: NEGATIVE mg/dL
Leukocytes,Ua: NEGATIVE
Nitrite: NEGATIVE
Protein, ur: NEGATIVE mg/dL
Specific Gravity, Urine: 1.009 (ref 1.005–1.030)
pH: 5 (ref 5.0–8.0)

## 2022-11-05 LAB — CBC WITH DIFFERENTIAL/PLATELET
Abs Immature Granulocytes: 0.12 10*3/uL — ABNORMAL HIGH (ref 0.00–0.07)
Basophils Absolute: 0 10*3/uL (ref 0.0–0.1)
Basophils Relative: 0 %
Eosinophils Absolute: 0.1 10*3/uL (ref 0.0–0.5)
Eosinophils Relative: 0 %
HCT: 51.5 % (ref 39.0–52.0)
Hemoglobin: 17 g/dL (ref 13.0–17.0)
Immature Granulocytes: 1 %
Lymphocytes Relative: 13 %
Lymphs Abs: 1.5 10*3/uL (ref 0.7–4.0)
MCH: 31.3 pg (ref 26.0–34.0)
MCHC: 33 g/dL (ref 30.0–36.0)
MCV: 94.8 fL (ref 80.0–100.0)
Monocytes Absolute: 0.5 10*3/uL (ref 0.1–1.0)
Monocytes Relative: 5 %
Neutro Abs: 9.6 10*3/uL — ABNORMAL HIGH (ref 1.7–7.7)
Neutrophils Relative %: 81 %
Platelets: 186 10*3/uL (ref 150–400)
RBC: 5.43 MIL/uL (ref 4.22–5.81)
RDW: 13.8 % (ref 11.5–15.5)
WBC: 11.9 10*3/uL — ABNORMAL HIGH (ref 4.0–10.5)
nRBC: 0 % (ref 0.0–0.2)

## 2022-11-05 LAB — BRAIN NATRIURETIC PEPTIDE: B Natriuretic Peptide: 33.3 pg/mL (ref 0.0–100.0)

## 2022-11-05 LAB — LACTIC ACID, PLASMA
Lactic Acid, Venous: 3.5 mmol/L (ref 0.5–1.9)
Lactic Acid, Venous: 3.4 mmol/L (ref 0.5–1.9)

## 2022-11-05 LAB — TROPONIN I (HIGH SENSITIVITY)
Troponin I (High Sensitivity): 21 ng/L — ABNORMAL HIGH (ref <18)
Troponin I (High Sensitivity): 21 ng/L — ABNORMAL HIGH (ref <18)

## 2022-11-05 MED ORDER — LACTATED RINGERS IV SOLN: INTRAVENOUS | Status: AC

## 2022-11-05 MED ORDER — PANTOPRAZOLE SODIUM 40 MG PO TBEC
40.0000 mg | DELAYED_RELEASE_TABLET | Freq: Every day | ORAL | Status: DC
  Administered 2022-11-05 – 2022-11-11 (×7): 40 mg via ORAL
  Filled 2022-11-05 (×8): qty 1

## 2022-11-05 MED ORDER — POLYETHYLENE GLYCOL 3350 17 G PO PACK: 17.0000 g | PACK | Freq: Every day | ORAL | Status: DC | PRN

## 2022-11-05 MED ORDER — SODIUM CHLORIDE 0.9% FLUSH
3.0000 mL | Freq: Two times a day (BID) | INTRAVENOUS | Status: DC
  Administered 2022-11-05 – 2022-11-10 (×10): 3 mL via INTRAVENOUS

## 2022-11-05 MED ORDER — TAMSULOSIN HCL 0.4 MG PO CAPS
0.4000 mg | ORAL_CAPSULE | Freq: Every day | ORAL | Status: DC
  Administered 2022-11-06 – 2022-11-11 (×6): 0.4 mg via ORAL
  Filled 2022-11-05 (×6): qty 1

## 2022-11-05 MED ORDER — HYDROMORPHONE HCL 1 MG/ML IJ SOLN
0.5000 mg | INTRAMUSCULAR | Status: DC | PRN
  Administered 2022-11-06 – 2022-11-09 (×5): 0.5 mg via INTRAVENOUS
  Filled 2022-11-05 (×3): qty 1
  Filled 2022-11-05: qty 0.5
  Filled 2022-11-05: qty 1

## 2022-11-05 MED ORDER — MYCOPHENOLATE MOFETIL 250 MG PO CAPS: 250.0000 mg | ORAL_CAPSULE | Freq: Two times a day (BID) | ORAL | Status: DC

## 2022-11-05 MED ORDER — ACETAMINOPHEN 650 MG RE SUPP: 650.0000 mg | Freq: Four times a day (QID) | RECTAL | Status: DC | PRN

## 2022-11-05 MED ORDER — ENOXAPARIN SODIUM 60 MG/0.6ML IJ SOSY
0.5000 mg/kg | PREFILLED_SYRINGE | INTRAMUSCULAR | Status: DC
  Administered 2022-11-05 – 2022-11-10 (×6): 60 mg via SUBCUTANEOUS
  Filled 2022-11-05 (×6): qty 0.6

## 2022-11-05 MED ORDER — AMLODIPINE BESYLATE 5 MG PO TABS
5.0000 mg | ORAL_TABLET | Freq: Every day | ORAL | Status: DC
  Administered 2022-11-06 – 2022-11-08 (×3): 5 mg via ORAL
  Filled 2022-11-05 (×3): qty 1

## 2022-11-05 MED ORDER — ONDANSETRON HCL 4 MG/2ML IJ SOLN
4.0000 mg | Freq: Four times a day (QID) | INTRAMUSCULAR | Status: DC | PRN
  Administered 2022-11-07 – 2022-11-09 (×2): 4 mg via INTRAVENOUS
  Filled 2022-11-05 (×2): qty 2

## 2022-11-05 MED ORDER — TACROLIMUS 0.5 MG PO CAPS
0.5000 mg | ORAL_CAPSULE | Freq: Every day | ORAL | Status: DC
  Administered 2022-11-06: 0.5 mg via ORAL
  Filled 2022-11-05: qty 1

## 2022-11-05 MED ORDER — ACETAMINOPHEN 325 MG PO TABS
650.0000 mg | ORAL_TABLET | Freq: Four times a day (QID) | ORAL | Status: DC | PRN
  Administered 2022-11-06 – 2022-11-09 (×6): 650 mg via ORAL
  Filled 2022-11-05 (×6): qty 2

## 2022-11-05 MED ORDER — MORPHINE SULFATE (PF) 4 MG/ML IV SOLN
4.0000 mg | Freq: Once | INTRAVENOUS | Status: AC
  Administered 2022-11-05: 4 mg via INTRAVENOUS
  Filled 2022-11-05: qty 1

## 2022-11-05 MED ORDER — LISINOPRIL 5 MG PO TABS
5.0000 mg | ORAL_TABLET | Freq: Every day | ORAL | Status: DC
  Administered 2022-11-06 – 2022-11-08 (×3): 5 mg via ORAL
  Filled 2022-11-05 (×3): qty 1

## 2022-11-05 MED ORDER — MYCOPHENOLATE MOFETIL 250 MG PO CAPS
500.0000 mg | ORAL_CAPSULE | Freq: Every day | ORAL | Status: DC
  Administered 2022-11-06 – 2022-11-11 (×6): 500 mg via ORAL
  Filled 2022-11-05 (×7): qty 2

## 2022-11-05 MED ORDER — TACROLIMUS 0.5 MG PO CAPS: 0.5000 mg | ORAL_CAPSULE | ORAL | Status: DC

## 2022-11-05 MED ORDER — IOHEXOL 300 MG/ML  SOLN
75.0000 mL | Freq: Once | INTRAMUSCULAR | Status: AC | PRN
  Administered 2022-11-05: 75 mL via INTRAVENOUS

## 2022-11-05 MED ORDER — PREDNISONE 10 MG PO TABS
10.0000 mg | ORAL_TABLET | Freq: Every day | ORAL | Status: DC
  Administered 2022-11-06 – 2022-11-11 (×6): 10 mg via ORAL
  Filled 2022-11-05 (×6): qty 1

## 2022-11-05 MED ORDER — MYCOPHENOLATE MOFETIL 250 MG PO CAPS
250.0000 mg | ORAL_CAPSULE | Freq: Every day | ORAL | Status: DC
  Administered 2022-11-05 – 2022-11-10 (×6): 250 mg via ORAL
  Filled 2022-11-05 (×6): qty 1

## 2022-11-05 MED ORDER — TACROLIMUS 0.5 MG PO CAPS
0.5000 mg | ORAL_CAPSULE | Freq: Every day | ORAL | Status: DC
  Administered 2022-11-05: 0.5 mg via ORAL
  Filled 2022-11-05: qty 1

## 2022-11-05 MED ORDER — HYDROCODONE-ACETAMINOPHEN 5-325 MG PO TABS
1.0000 | ORAL_TABLET | Freq: Three times a day (TID) | ORAL | Status: DC | PRN
  Administered 2022-11-06 – 2022-11-10 (×7): 1 via ORAL
  Filled 2022-11-05 (×8): qty 1

## 2022-11-05 MED ORDER — SODIUM CHLORIDE 0.9 % IV BOLUS: 1000.0000 mL | Freq: Once | INTRAVENOUS | Status: DC

## 2022-11-05 MED ORDER — LACTATED RINGERS IV BOLUS (SEPSIS)
1000.0000 mL | Freq: Once | INTRAVENOUS | Status: AC
  Administered 2022-11-05: 1000 mL via INTRAVENOUS

## 2022-11-05 MED ORDER — SODIUM CHLORIDE 0.9 % IV SOLN
2.0000 g | INTRAVENOUS | Status: DC
  Administered 2022-11-05 – 2022-11-07 (×3): 2 g via INTRAVENOUS
  Filled 2022-11-05 (×4): qty 20

## 2022-11-05 MED ORDER — ONDANSETRON HCL 4 MG/2ML IJ SOLN
4.0000 mg | Freq: Once | INTRAMUSCULAR | Status: AC
  Administered 2022-11-05: 4 mg via INTRAVENOUS
  Filled 2022-11-05: qty 2

## 2022-11-05 MED ORDER — ONDANSETRON HCL 4 MG PO TABS: 4.0000 mg | ORAL_TABLET | Freq: Four times a day (QID) | ORAL | Status: DC | PRN

## 2022-11-05 NOTE — Assessment & Plan Note (Addendum)
Creatinine 1.81 on presentation down to 1.58 with IV fluids

## 2022-11-05 NOTE — Progress Notes (Signed)
PHARMACIST - PHYSICIAN COMMUNICATION  CONCERNING:  Enoxaparin (Lovenox) for DVT Prophylaxis    RECOMMENDATION: Patient was prescribed enoxaprin 40mg  q24 hours for VTE prophylaxis.   Filed Weights   11/05/22 1545  Weight: 120.2 kg (265 lb)    Body mass index is 35.94 kg/m.  Estimated Creatinine Clearance: 55.2 mL/min (A) (by C-G formula based on SCr of 1.81 mg/dL (H)).   Based on [facility] policy patient is candidate for enoxaparin 0.5mg /kg TBW SQ every 24 hours based on BMI being >30.  DESCRIPTION: Pharmacy has adjusted enoxaparin dose per [facility] policy.  Patient is now receiving enoxaparin 60 mg every 24 hours    Bettey Costa, PharmD Clinical Pharmacist  11/05/2022 8:35 PM

## 2022-11-05 NOTE — Assessment & Plan Note (Addendum)
History of cirrhosis s/p liver transplantation on tacrolimus and CellCept and prednisone.  Advised to follow-up with liver transplant team.

## 2022-11-05 NOTE — Assessment & Plan Note (Addendum)
Continue home prednisone. 

## 2022-11-05 NOTE — ED Triage Notes (Signed)
Patient c/o lower back pain and lower abdominal pain. Frequent urination. Reports symptoms X3 weeks    History liver transplant patient X15 years

## 2022-11-05 NOTE — Assessment & Plan Note (Deleted)
Patient is presenting with gradually worsening lower quadrant and suprapubic abdominal pain, dysuria, urinary frequency.  History of frequent UTI currently on trimethoprim. Complicated by immunocompromised state on CellCept and tacrolimus in addition to bilateral nonobstructing renal stones.  Previous cultures has grown E. coli that is resistant to ampicillin/Unasyn.    On admission, patient met SIRS criteria, however no evidence of endorgan damage to suggest sepsis.  (Renal function is currently at baseline.  Patient is on room oxygen.  No evidence of encephalopathy)  - Telemetry monitoring - Continue ceftriaxone 2 g daily - Urine culture pending - Blood cultures pending - Norco and Dilaudid for pain control - S/p 1 L bolus - Hold home trimethoprim

## 2022-11-05 NOTE — H&P (Signed)
History and Physical    Patient: ELEAZER BARQUERO ZOX:[id number] DOB: 22-Jul-1955 DOA: 11/05/2022 DOS: the patient was seen and examined on 11/05/2022 PCP: Karie Schwalbe, MD  Patient coming from: Home  Chief Complaint:  Chief Complaint  Patient presents with   Back Pain   Abdominal Pain   Shortness of Breath   HPI: GONZALO WIMSATT is a 67 y.o. male with medical history significant of cirrhosis s/p liver transplant on chronic immunosuppressive therapy (tacrolimus and mycophenolate mofetil), frequent UTI on suppressive antibiotics, gout on chronic prednisone, HFpEF, MV thrombosis, hypertension who presents to the ED with abdominal pain.  Mr. Weiss states that for the last 4 weeks, he has been experiencing gradually worsening suprapubic and bilateral lower quadrant abdominal pain that is significantly worsened with urinating.  In addition, he notes that he has been urinating more frequently.  He notes that over the last several days, his symptoms have worsened more quickly.  He notes that over the last 24 to 48 hours, he has been experiencing nausea with vomiting and difficulty tolerating p.o. intake.  In addition, he awoke today with significant malaise.  He endorses recurrent hot and cold chills/sweats that have been going on for the last 2 weeks.  He denies any palpitations or shortness of breath at rest.  Mr. Paynter endorses dyspnea on exertion that seems to have gotten worse since his symptoms began but states that is otherwise chronic.  He endorses occasional chest pain that lasts for several minutes before self resolving.  He notes that episodes occur every 8 to 9 days.  ED course: On arrival to the ED, patient was hypertensive at 157/98 with heart rate of 116.  He was saturating at 93 percent on room air.  He was afebrile 97.7.  Initial workup notable for WBC of 11.9, glucose of 333, creatinine 1.81, AST 56, ALT 120, and GFR 41.  Lactic acid elevated at 3.5.  Troponin elevated at 21  with flat trend to 21.  BNP within normal limits at 33.  Urinalysis was obtained that was negative for leukocytes, nitrites but rare bacteria.  CT of the abdomen was obtained that demonstrated punctate bilateral nonobstructing renal stones and mild scattered colonic stool, with mild low-density along the wall of several loops of small bowel.  Patient started on ceftriaxone, and IV fluids.  TRH contacted for admission.  Review of Systems: As mentioned in the history of present illness. All other systems reviewed and are negative.  Past Medical History:  Diagnosis Date   Arthritis    back and legs   Benign prostatic hypertrophy    CHF (congestive heart failure) (HCC)    GERD (gastroesophageal reflux disease)    Gout    History of blood transfusion    pt has antibodies in his blood since previous transfusions   History of cirrhosis of liver S/P TRANSPLANT 2009   History of liver failure S/P TRANSPLANT   Hypertension    Left ureteral calculus    Renal disorder    Stroke [facility])    Past Surgical History:  Procedure Laterality Date   APPENDECTOMY     bone morrow biopsy     CHOLECYSTECTOMY  2007   CYSTO/ LEFT RETROGRADE PYELOGRAM/ LEFT URETERAL STENT PLACEMENT  03-05-2012  DR [facility])   LEFT URETERAL CALCULI   CYSTOSCOPY W/ URETERAL STENT PLACEMENT  03/11/2012   Procedure: CYSTOSCOPY WITH STENT REPLACEMENT;  Surgeon: Sebastian Ache, MD;  Location: [facility];  Service: Urology;  Laterality:  Left;   HERNIA REPAIR  2008   LIVER BIOPSY     LIVER TRANSPLANT  11/24/2007   pt states doing well since liver transplant   LUMBAR DISC SURGERY     LUMBAR FUSION     removal of fistula     URETEROSCOPY  03/11/2012   Procedure: URETEROSCOPY;  Surgeon: Sebastian Ache, MD;  Location: [facility];  Service: Urology;  Laterality: Left;   STONE MANIPULATION, stone obtained  [id number] [facility]   Social History:  reports that he has never smoked. He has been exposed to  tobacco smoke. He has never used smokeless tobacco. He reports that he does not drink alcohol and does not use drugs.  No Known Allergies  Family History  Problem Relation Age of Onset   Cancer Mother        colon   Arthritis Mother    Vasculitis Mother    Hypertension Mother    Cancer Father        colon   Kidney disease Father    Arthritis Father    Arthritis Brother    Alcohol abuse Maternal Aunt    Diabetes Maternal Aunt    Alcohol abuse Maternal Uncle    Diabetes Paternal Aunt    Alcohol abuse Maternal Uncle    Alcohol abuse Maternal Uncle     Prior to Admission medications   Medication Sig Start Date End Date Taking? Authorizing Provider  acetaminophen (TYLENOL) 325 MG tablet Take 650 mg by mouth every 6 (six) hours as needed for mild pain.    [provider]  amLODipine (NORVASC) 5 MG tablet Take 5 mg by mouth daily.    [provider]  ascorbic acid (VITAMIN C) 500 MG tablet Take 1 tablet (500 mg total) by mouth daily. 11/07/21   Kathlen Mody, MD  aspirin 325 MG tablet Take 1 tablet (325 mg total) by mouth daily. 11/07/21   Kathlen Mody, MD  calcium carbonate (TUMS) 500 MG chewable tablet Chew 1 tablet (200 mg of elemental calcium total) by mouth 2 (two) times daily as needed for indigestion or heartburn. 06/26/16   Joaquim Nam, MD  colchicine 0.6 MG tablet Take 1 tablet (0.6 mg total) by mouth daily as needed. Patient not taking: Reported on 05/06/2022 11/06/21   Kathlen Mody, MD  furosemide (LASIX) 40 MG tablet TAKE 1 TABLET(40 MG) BY MOUTH DAILY 10/14/22   Tillman Abide I, MD  lisinopril (ZESTRIL) 5 MG tablet Take 1 tablet (5 mg total) by mouth daily. 09/19/22   Karie Schwalbe, MD  Multiple Vitamin (MULTIVITAMIN WITH MINERALS) TABS tablet Take 1 tablet by mouth daily. 11/07/21   Kathlen Mody, MD  mycophenolate (CELLCEPT) 250 MG capsule Take 250-500 mg by mouth 2 (two) times daily. 2 capsules (500 mg) in the morning and one capsule (250 mg) in the  evening 02/04/19   [provider]  omeprazole (PRILOSEC) 20 MG capsule Take 1 capsule (20 mg total) by mouth daily. 02/19/22   Karie Schwalbe, MD  predniSONE (DELTASONE) 10 MG tablet Take 10 mg by mouth daily with breakfast.    [provider]  rosuvastatin (CRESTOR) 5 MG tablet Take 1 tablet (5 mg total) by mouth every evening. Patient not taking: Reported on 05/06/2022 11/06/21   Kathlen Mody, MD  senna (SENOKOT) 8.6 MG TABS tablet Take 2 tablets (17.2 mg total) by mouth daily. 01/01/22   Alwyn Ren, MD  SUMAtriptan (IMITREX) 100 MG tablet Take 1 tablet (  100 mg total) by mouth once as needed for migraine (may repeat x 1 in 24 hrs not more than 2 days per week. total 9 tablets). May repeat in 2 hours if headache persists or recurs. Patient not taking: Reported on 05/06/2022 11/06/21 11/07/22  Kathlen Mody, MD  tacrolimus (PROGRAF) 0.5 MG capsule Take 0.5-1 mg by mouth See admin instructions. Taking 0.5 mg in the morning and 1 mg at bedtime    [provider]  tamsulosin (FLOMAX) 0.4 MG CAPS capsule TAKE 1 CAPSULE(0.4 MG) BY MOUTH DAILY 12/17/21   Tillman Abide I, MD  trimethoprim (TRIMPEX) 100 MG tablet TAKE 1 TABLET(100 MG) BY MOUTH DAILY FOR UTI PREVENTION 10/14/22   Karie Schwalbe, MD  vitamin A 3 MG (10000 UNITS) capsule Take 1 capsule (10,000 Units total) by mouth daily. 11/07/21   Kathlen Mody, MD  zinc sulfate 220 (50 Zn) MG capsule Take 1 capsule (220 mg total) by mouth daily. 11/07/21   Kathlen Mody, MD    Physical Exam: Vitals:   11/05/22 1544 11/05/22 1545 11/05/22 1948  BP: (!) 157/98  (!) 116/98  Pulse: (!) 116  74  Resp: (!) 22  20  Temp: 97.7 F (36.5 C)  98.1 F (36.7 C)  TempSrc: Oral    SpO2: 93%  95%  Weight:  120.2 kg   Height:  6' (1.829 m)    Physical Exam Vitals and nursing note reviewed.  Constitutional:      General: He is not in acute distress.    Appearance: He is obese. He is not toxic-appearing.  HENT:     Head:  Normocephalic and atraumatic.     Mouth/Throat:     Mouth: Mucous membranes are moist.     Pharynx: Oropharynx is clear.  Eyes:     Conjunctiva/sclera: Conjunctivae normal.     Pupils: Pupils are equal, round, and reactive to light.  Cardiovascular:     Rate and Rhythm: Normal rate and regular rhythm.     Heart sounds: No murmur heard. Pulmonary:     Effort: Pulmonary effort is normal. No respiratory distress.     Breath sounds: Normal breath sounds. No wheezing, rhonchi or rales.  Abdominal:     General: Bowel sounds are normal.     Palpations: Abdomen is soft.     Tenderness: There is abdominal tenderness in the right lower quadrant, suprapubic area and left lower quadrant. There is no right CVA tenderness, left CVA tenderness or guarding.     Hernia: No hernia is present.  Musculoskeletal:     Right lower leg: No edema.     Left lower leg: No edema.  Skin:    General: Skin is warm and dry.  Neurological:     General: No focal deficit present.     Mental Status: He is alert and oriented to person, place, and time.  Psychiatric:        Mood and Affect: Mood normal.        Behavior: Behavior normal.    Data Reviewed: CBC with WBC of 11.9, hemoglobin 17.0, platelets of 186 CMP with sodium of 135, potassium 4.4, bicarb 23, glucose 333, BUN 20, creatinine 1.81, AST 56, ALT 120, and GFR 41. Lactic acid elevated at 3.5 with downtrend to 3.4 BNP within normal limits at 33 Troponin elevated 21 with flat trend to 21. Urinalysis with glucosuria, but no leukocytes, nitrites, hematuria or proteinuria.  Rare bacteria seen with no WBC or RBC.  CT ABDOMEN PELVIS  W CONTRAST  Result Date: 11/05/2022 CLINICAL DATA:  Lower back and abdominal pain EXAM: CT ABDOMEN AND PELVIS WITH CONTRAST TECHNIQUE: Multidetector CT imaging of the abdomen and pelvis was performed using the standard protocol following bolus administration of intravenous contrast. RADIATION DOSE REDUCTION: This exam was performed  according to the departmental dose-optimization program which includes automated exposure control, adjustment of the mA and/or kV according to patient size and/or use of iterative reconstruction technique. CONTRAST:  75mL OMNIPAQUE IOHEXOL 300 MG/ML  SOLN COMPARISON:  Renal ultrasound 01/30/2022. CT 12/25/2021 without contrast. Older exams as well FINDINGS: Lower chest: There is some linear opacity lung bases likely scar or atelectasis. No pleural effusion. Hepatobiliary: No focal liver abnormality is seen. Status post cholecystectomy. No biliary dilatation. Pancreas: Mild global atrophy of the pancreas without mass lesion. Spleen: Normal in size without focal abnormality. Adrenals/Urinary Tract: With the adrenal gland is preserved. The right is minimally thickened and nodular, unchanged from previous. Prominent bilateral renal sinus fat without collecting system dilatation. Punctate bilateral nonobstructing stones. No ureteral stones. Bilateral Bosniak 1 renal cysts. No specific imaging follow-up. The ureters have normal course and caliber down to the bladder. Preserved contour to the urinary bladder. Stomach/Bowel: On this non oral contrast exam, the stomach is nondilated with some luminal debris. There is some high attenuation material within the stomach near the lesser curve, unchanged from previous. Please correlate for a treatment clip. Small bowel is nondilated. There are several loops of small bowel particularly the ileum which has some subtle low-density within the wall, possible fat. Please correlate for any history of chronic inflammatory states. Surgical changes near the base of the cecum. The appendix is not seen. The large bowel is nondilated. Few left-sided colonic diverticula. Vascular/Lymphatic: Aortic atherosclerosis. No enlarged abdominal or pelvic lymph nodes. Reproductive: Prostate is unremarkable. Other: Small fat containing umbilical hernia. Musculoskeletal: Scattered degenerative changes of  the spine and pelvis. Streak artifact related to the patient's fixation hardware along the lumbar spine. IMPRESSION: Punctate bilateral nonobstructing renal stones. Mild scattered colonic stool. Surgical changes in the right lower quadrant. The appendix is poorly seen. No pericecal stranding or fluid. Please correlate with surgical history as well. Nonspecific mild low-density along the wall of several loops of small bowel. Please correlate for any history of chronic inflammatory state. No obstruction, free air or free fluid. Electronically Signed   By: Karen Kays M.D.   On: 11/05/2022 18:55   DG Chest 2 View  Result Date: 11/05/2022 CLINICAL DATA:  Shortness of breath with lower back pain and lower abdominal pain. EXAM: CHEST - 2 VIEW COMPARISON:  January 30, 2022 FINDINGS: The heart size and mediastinal contours are within normal limits. Low lung volumes are noted. Mild, chronic appearing increased interstitial lung markings are seen. This is very mildly increased in severity when compared to the prior study. There is no evidence of focal consolidation, pleural effusion or pneumothorax. Radiopaque surgical clips are seen within the right upper quadrant. The visualized skeletal structures are unremarkable. IMPRESSION: Very mildly increased interstitial lung markings, likely chronic in nature. A mild superimposed component of interstitial edema cannot be excluded. Electronically Signed   By: Aram Candela M.D.   On: 11/05/2022 16:10    Results are pending, will review when available.  Assessment and Plan:  * Complicated UTI (urinary tract infection) Patient is presenting with gradually worsening lower quadrant and suprapubic abdominal pain, dysuria, urinary frequency.  History of frequent UTI currently on trimethoprim. Complicated by immunocompromised state on CellCept  and tacrolimus in addition to bilateral nonobstructing renal stones.  Previous cultures has grown E. coli that is resistant to  ampicillin/Unasyn.    On admission, patient met SIRS criteria, however no evidence of endorgan damage to suggest sepsis.  (Renal function is currently at baseline.  Patient is on room oxygen.  No evidence of encephalopathy)  - Telemetry monitoring - Continue ceftriaxone 2 g daily - Urine culture pending - Blood cultures pending - Norco and Dilaudid for pain control - S/p 1 L bolus - Hold home trimethoprim  Stage 3b chronic kidney disease (HCC) Patient has a history of CKD stage IIIb With outpatient creatinine ranging around 1.8-1.9 over the last 1 year.  Renal function currently at baseline.  - Monitor renal indices while admitted  Liver transplant status (HCC) History of cirrhosis s/p liver transplantation on tacrolimus and CellCept.  Follows with the [facility].  History of chronically elevated LFTs for the last several weeks that is stable today.  - Continue home tacrolimus and CellCept - Repeat CMP in the a.m. - Continue outpatient follow-up with GI regarding LFTs  Chronic diastolic heart failure (HCC) Patient appears euvolemic at this time.  At risk for dehydration, so we will hold home diuretics for now and restart when clinically stable.  -Holding home Lasix  Hypertension - Resume home antihypertensives tomorrow  Chronic tophaceous gout - Continue home prednisone  Advance Care Planning:   Code Status: Full Code verified by patient  Consults: None  Family Communication: No family at bedside  Severity of Illness: The appropriate patient status for this patient is INPATIENT. Inpatient status is judged to be reasonable and necessary in order to provide the required intensity of service to ensure the patient's safety. The patient's presenting symptoms, physical exam findings, and initial radiographic and laboratory data in the context of their chronic comorbidities is felt to place them at high risk for further clinical deterioration. Furthermore, it is not  anticipated that the patient will be medically stable for discharge from the hospital within 2 midnights of admission.   * I certify that at the point of admission it is my clinical judgment that the patient will require inpatient hospital care spanning beyond 2 midnights from the point of admission due to high intensity of service, high risk for further deterioration and high frequency of surveillance required.*  Author: Verdene Lennert, MD 11/05/2022 9:04 PM  For on call review [email or web address].

## 2022-11-05 NOTE — Assessment & Plan Note (Addendum)
Increase Norvasc to 10 mg daily and lisinopril. As needed iv labetalol.

## 2022-11-05 NOTE — ED Provider Notes (Signed)
[facility] Provider Note    Event Date/Time   First MD Initiated Contact with Patient 11/05/22 1640     (approximate)   History   Back Pain, Abdominal Pain, and Shortness of Breath   HPI  Devin White is a 67 y.o. male with a history of liver failure due to NASH status post transplant and on immunosuppressive therapy, hypertension, GERD, gout, BPH, kidney stones, chronic kidney disease, sleep apnea, CVA, and recurrent UTIs who presents with bilateral lower abdominal, back, and flank pain over the last few weeks associated with frequent urination.  The patient also reports some dizziness and generalized weakness for the last few weeks.  He states that the symptoms feel similar to prior UTIs.  He previously has had numerous UTIs, but has been on prophylactic antibiotics for several months and so the last UTI was in August of last year.  I reviewed the past medical records.  The patient was most recently admitted in August 2023; per the hospitalist discharge summary he presented with lower abdominal and flank pain and was started on antibiotics for UTI.  Previously had been admitted for sepsis due to UTI in July.  Urine cultures from 8/15 of last year show E. coli which is broadly sensitive.   Physical Exam   Triage Vital Signs: ED Triage Vitals  Enc Vitals Group     BP 11/05/22 1544 (!) 157/98     Pulse Rate 11/05/22 1544 (!) 116     Resp 11/05/22 1544 (!) 22     Temp 11/05/22 1544 97.7 F (36.5 C)     Temp Source 11/05/22 1544 Oral     SpO2 11/05/22 1544 93 %     Weight 11/05/22 1545 265 lb (120.2 kg)     Height 11/05/22 1545 6' (1.829 m)     Head Circumference --      Peak Flow --      Pain Score 11/05/22 1545 8     Pain Loc --      Pain Edu? --      Excl. in GC? --     Most recent vital signs: Vitals:   11/05/22 1544 11/05/22 1948  BP: (!) 157/98 (!) 116/98  Pulse: (!) 116 74  Resp: (!) 22 20  Temp: 97.7 F (36.5 C) 98.1 F (36.7 C)   SpO2: 93% 95%     General: Awake, no distress.  CV:  Good peripheral perfusion.  Resp:  Normal effort.  Abd:  Soft with mild bilateral lower quadrant discomfort.  No focal tenderness.  No distention.  Other:  No jaundice or scleral icterus.   ED Results / Procedures / Treatments   Labs (all labs ordered are listed, but only abnormal results are displayed) Labs Reviewed  LACTIC ACID, PLASMA - Abnormal; Notable for the following components:      Result Value   Lactic Acid, Venous 3.5 (*)    All other components within normal limits  LACTIC ACID, PLASMA - Abnormal; Notable for the following components:   Lactic Acid, Venous 3.4 (*)    All other components within normal limits  COMPREHENSIVE METABOLIC PANEL - Abnormal; Notable for the following components:   Glucose, Bld 333 (*)    Creatinine, Ser 1.81 (*)    AST 56 (*)    ALT 120 (*)    Total Bilirubin 1.6 (*)    GFR, Estimated 41 (*)    All other components within normal limits  CBC WITH DIFFERENTIAL/PLATELET -  Abnormal; Notable for the following components:   WBC 11.9 (*)    Neutro Abs 9.6 (*)    Abs Immature Granulocytes 0.12 (*)    All other components within normal limits  URINALYSIS, W/ REFLEX TO CULTURE (INFECTION SUSPECTED) - Abnormal; Notable for the following components:   Color, Urine YELLOW (*)    APPearance CLEAR (*)    Glucose, UA 150 (*)    Bacteria, UA RARE (*)    All other components within normal limits  TROPONIN I (HIGH SENSITIVITY) - Abnormal; Notable for the following components:   Troponin I (High Sensitivity) 21 (*)    All other components within normal limits  TROPONIN I (HIGH SENSITIVITY) - Abnormal; Notable for the following components:   Troponin I (High Sensitivity) 21 (*)    All other components within normal limits  CULTURE, BLOOD (ROUTINE X 2)  CULTURE, BLOOD (ROUTINE X 2)  BRAIN NATRIURETIC PEPTIDE  URINALYSIS, ROUTINE W REFLEX MICROSCOPIC     EKG     RADIOLOGY  Chest x-ray:  I independently viewed and interpreted the images; there is no focal consolidation or edema.  Radiology report indicates faint interstitial markings.  CT abdomen/pelvis:  IMPRESSION:  Punctate bilateral nonobstructing renal stones.    Mild scattered colonic stool. Surgical changes in the right lower  quadrant. The appendix is poorly seen. No pericecal stranding or  fluid. Please correlate with surgical history as well.    Nonspecific mild low-density along the wall of several loops of  small bowel. Please correlate for any history of chronic  inflammatory state. No obstruction, free air or free fluid.    PROCEDURES:  Critical Care performed: Yes, see critical care procedure note(s)  .Critical Care  Performed by: Dionne Bucy, MD Authorized by: Dionne Bucy, MD   Critical care provider statement:    Critical care time (minutes):  30   Critical care time was exclusive of:  Separately billable procedures and treating other patients   Critical care was necessary to treat or prevent imminent or life-threatening deterioration of the following conditions:  Sepsis   Critical care was time spent personally by me on the following activities:  Development of treatment plan with patient or surrogate, discussions with consultants, evaluation of patient's response to treatment, examination of patient, ordering and review of laboratory studies, ordering and review of radiographic studies, ordering and performing treatments and interventions, pulse oximetry, re-evaluation of patient's condition, review of old charts and obtaining history from patient or surrogate   Care discussed with: admitting provider      MEDICATIONS ORDERED IN ED: Medications  cefTRIAXone (ROCEPHIN) 2 g in sodium chloride 0.9 % 100 mL IVPB (2 g Intravenous New Bag/Given 11/05/22 1946)  lactated ringers bolus 1,000 mL (0 mLs Intravenous Stopped 11/05/22 1813)  morphine (PF) 4 MG/ML injection 4 mg (4 mg  Intravenous Given 11/05/22 1821)  ondansetron (ZOFRAN) injection 4 mg (4 mg Intravenous Given 11/05/22 1822)  iohexol (OMNIPAQUE) 300 MG/ML solution 75 mL (75 mLs Intravenous Contrast Given 11/05/22 1833)     IMPRESSION / MDM / ASSESSMENT AND PLAN / ED COURSE  I reviewed the triage vital signs and the nursing notes.  67 year old male with PMH as noted above presents with bilateral lower quadrant abdominal pain, flank pain, and urinary frequency along with some dizziness and generalized weakness over the last few weeks; symptoms are similar to prior UTIs and acutely worsened over the last few days.  Differential diagnosis includes, but is not limited to, recurrent UTI, other  acute infection/sepsis, diverticulitis, colitis, gastroenteritis, dehydration, electrolyte abnormality, other metabolic cause.  Initial labs show a lactate of 3.5 consistent with sepsis.  WBC count is 11.9.  We will obtain blood cultures, urinalysis, CT abdomen/pelvis, give a fluid bolus, and reassess.  Patient's presentation is most consistent with acute presentation with potential threat to life or bodily function.  The patient is on the cardiac monitor to evaluate for evidence of arrhythmia and/or significant heart rate changes.   ----------------------------------------- 7:47 PM on 11/05/2022 -----------------------------------------  I ordered IV ceftriaxone for presumed UTI.  Urinalysis does show significant findings.  CT abdomen/pelvis shows some inflammatory changes in the bowel, but no other acute findings.  The patient is status post appendectomy.  He is receiving fluids.  Lactate remains somewhat elevated.  I consulted Dr. Huel Cote from the hospitalist service; based on her discussion she agrees to evaluate the patient for admission.   FINAL CLINICAL IMPRESSION(S) / ED DIAGNOSES   Final diagnoses:  Urinary tract infection without hematuria, site unspecified  Sepsis, due to unspecified organism, unspecified  whether acute organ dysfunction present [facility])     Rx / DC Orders   ED Discharge Orders     None        Note:  This document was prepared using Dragon voice recognition software and may include unintentional dictation errors.    Dionne Bucy, MD 11/05/22 1950

## 2022-11-05 NOTE — Assessment & Plan Note (Addendum)
No signs of heart failure during this hospital course.

## 2022-11-06 ENCOUNTER — Encounter: Payer: Self-pay | Admitting: Internal Medicine

## 2022-11-06 DIAGNOSIS — R102 Pelvic and perineal pain: Secondary | ICD-10-CM | POA: Diagnosis not present

## 2022-11-06 DIAGNOSIS — M533 Sacrococcygeal disorders, not elsewhere classified: Secondary | ICD-10-CM

## 2022-11-06 DIAGNOSIS — E669 Obesity, unspecified: Secondary | ICD-10-CM

## 2022-11-06 DIAGNOSIS — N1832 Chronic kidney disease, stage 3b: Secondary | ICD-10-CM | POA: Diagnosis not present

## 2022-11-06 DIAGNOSIS — Z944 Liver transplant status: Secondary | ICD-10-CM | POA: Diagnosis not present

## 2022-11-06 DIAGNOSIS — R3911 Hesitancy of micturition: Secondary | ICD-10-CM

## 2022-11-06 DIAGNOSIS — R7989 Other specified abnormal findings of blood chemistry: Secondary | ICD-10-CM | POA: Insufficient documentation

## 2022-11-06 DIAGNOSIS — N401 Enlarged prostate with lower urinary tract symptoms: Secondary | ICD-10-CM

## 2022-11-06 LAB — COMPREHENSIVE METABOLIC PANEL
ALT: 93 U/L — ABNORMAL HIGH (ref 0–44)
AST: 40 U/L (ref 15–41)
Albumin: 3.5 g/dL (ref 3.5–5.0)
Alkaline Phosphatase: 49 U/L (ref 38–126)
Anion gap: 8 (ref 5–15)
BUN: 18 mg/dL (ref 8–23)
CO2: 24 mmol/L (ref 22–32)
Calcium: 8.5 mg/dL — ABNORMAL LOW (ref 8.9–10.3)
Chloride: 107 mmol/L (ref 98–111)
Creatinine, Ser: 1.57 mg/dL — ABNORMAL HIGH (ref 0.61–1.24)
GFR, Estimated: 49 mL/min — ABNORMAL LOW (ref >60)
Glucose, Bld: 121 mg/dL — ABNORMAL HIGH (ref 70–99)
Potassium: 4.1 mmol/L (ref 3.5–5.1)
Sodium: 139 mmol/L (ref 135–145)
Total Bilirubin: 1.3 mg/dL — ABNORMAL HIGH (ref 0.3–1.2)
Total Protein: 5.7 g/dL — ABNORMAL LOW (ref 6.5–8.1)

## 2022-11-06 LAB — HIV ANTIBODY (ROUTINE TESTING W REFLEX): HIV Screen 4th Generation wRfx: NONREACTIVE

## 2022-11-06 LAB — CBC
HCT: 45.3 % (ref 39.0–52.0)
Hemoglobin: 15 g/dL (ref 13.0–17.0)
MCH: 31.4 pg (ref 26.0–34.0)
MCHC: 33.1 g/dL (ref 30.0–36.0)
MCV: 95 fL (ref 80.0–100.0)
Platelets: 144 10*3/uL — ABNORMAL LOW (ref 150–400)
RBC: 4.77 MIL/uL (ref 4.22–5.81)
RDW: 13.8 % (ref 11.5–15.5)
WBC: 8.8 10*3/uL (ref 4.0–10.5)
nRBC: 0 % (ref 0.0–0.2)

## 2022-11-06 LAB — CULTURE, BLOOD (ROUTINE X 2)

## 2022-11-06 LAB — LACTIC ACID, PLASMA: Lactic Acid, Venous: 1.3 mmol/L (ref 0.5–1.9)

## 2022-11-06 LAB — PROTIME-INR
INR: 1.1 (ref 0.8–1.2)
Prothrombin Time: 14.1 seconds (ref 11.4–15.2)

## 2022-11-06 MED ORDER — TACROLIMUS 1 MG PO CAPS
1.0000 mg | ORAL_CAPSULE | Freq: Every day | ORAL | Status: DC
  Administered 2022-11-06 – 2022-11-10 (×4): 1 mg via ORAL
  Filled 2022-11-06 (×5): qty 1

## 2022-11-06 MED ORDER — METHYLPREDNISOLONE SODIUM SUCC 40 MG IJ SOLR
40.0000 mg | Freq: Once | INTRAMUSCULAR | Status: AC
  Administered 2022-11-06: 40 mg via INTRAVENOUS
  Filled 2022-11-06: qty 1

## 2022-11-06 MED ORDER — TACROLIMUS 0.5 MG PO CAPS
0.5000 mg | ORAL_CAPSULE | Freq: Every day | ORAL | Status: DC
  Administered 2022-11-07 – 2022-11-11 (×5): 0.5 mg via ORAL
  Filled 2022-11-06 (×6): qty 1

## 2022-11-06 NOTE — Progress Notes (Signed)
Progress Note   Patient: Devin White ZOX:[id number] DOB: 11-12-1955 DOA: 11/05/2022     0 DOS: the patient was seen and examined on 11/06/2022   Brief hospital course: 67 y.o. male with medical history significant of cirrhosis s/p liver transplant on chronic immunosuppressive therapy (tacrolimus and mycophenolate mofetil), frequent UTI on suppressive antibiotics, gout on chronic prednisone, HFpEF, MV thrombosis, hypertension who presents to the ED with abdominal pain.   [name] states that for the last 4 weeks, he has been experiencing gradually worsening suprapubic and bilateral lower quadrant abdominal pain that is significantly worsened with urinating.  In addition, he notes that he has been urinating more frequently.  He notes that over the last several days, his symptoms have worsened more quickly.  He notes that over the last 24 to 48 hours, he has been experiencing nausea with vomiting and difficulty tolerating p.o. intake.  In addition, he awoke today with significant malaise.  He endorses recurrent hot and cold chills/sweats that have been going on for the last 2 weeks.  He denies any palpitations or shortness of breath at rest.   [name] endorses dyspnea on exertion that seems to have gotten worse since his symptoms began but states that is otherwise chronic.  He endorses occasional chest pain that lasts for several minutes before self resolving.  He notes that episodes occur every 8 to 9 days.  5/22.  Reviewed CT scan of the abdomen and urine analysis.  Continue antibiotics for now.  Will give 1 dose of IV Solu-Medrol with bilateral sacroiliac pain.  Assessment and Plan: * Acute suprapubic pain With patient's immunosuppressed status, I will keep here in the hospital until we receive his urine culture back.  Continue antibiotics for now.  Urine analysis does not look impressive.  CT scan does not show any etiology of his pain.  Sepsis ruled out.  Pain of both sacroiliac  joints Right greater than left.  Will give 1 dose of Solu-Medrol today and back to his chronic prednisone tomorrow.  Stage 3b chronic kidney disease (HCC) Creatinine 1.81 on presentation down to 1.57 with IV fluids  Liver transplant status (HCC) History of cirrhosis s/p liver transplantation on tacrolimus and CellCept and prednisone.  Chronic diastolic heart failure (HCC) No signs of heart failure  Elevated liver function tests Continue to monitor  Obesity (BMI 30-39.9) BMI 35.94 with current height and weight in computer.  Hypertension Continue Norvasc and lisinopril  BPH (benign prostatic hyperplasia) With hesitancy and symptoms.  Continue Flomax  Chronic tophaceous gout - Continue home prednisone        Subjective: Patient has not been feeling well for few weeks.  Has had burning on urination and difficulty getting his urine out and having sweating episodes.  He thinks he has a urinary tract infection.  Urine analysis did not look impressive.  Patient takes immunosuppressants because of liver transplant.  Physical Exam: Vitals:   11/06/22 0758 11/06/22 0900 11/06/22 1000 11/06/22 1130  BP: (!) 152/98 (!) 152/94 (!) 161/93 (!) 153/93  Pulse: 80 76 75 72  Resp: 14 12 (!) 24 11  Temp: 97.8 F (36.6 C)   98 F (36.7 C)  TempSrc: Oral     SpO2: 94% 97% 95% 93%  Weight:      Height:       Physical Exam HENT:     Head: Normocephalic.     Mouth/Throat:     Pharynx: No oropharyngeal exudate.  Eyes:  General: Lids are normal.     Conjunctiva/sclera: Conjunctivae normal.  Cardiovascular:     Rate and Rhythm: Normal rate and regular rhythm.     Heart sounds: Normal heart sounds, S1 normal and S2 normal.  Abdominal:     Palpations: Abdomen is soft.     Tenderness: There is no abdominal tenderness.  Musculoskeletal:     Right lower leg: No swelling.     Left lower leg: No swelling.     Comments: Bilateral sacroiliac pain worse on the right than the left.   Skin:    General: Skin is warm.     Findings: No rash.  Neurological:     Mental Status: He is alert and oriented to person, place, and time.     Comments: Able to straight leg rise     Data Reviewed: Creatinine 1.57, total protein 5.7, ALT 93, total bilirubin 1.3 lactic acid was 3.5 but came down to 1.3, white blood cell count 8.8, hemoglobin 15, platelet count 144  Family Communication: Updated patient's daughter on the phone  Disposition: Status is: Observation With patient's immunosuppressed status I will watch until urine culture comes back.  Planned Discharge Destination: Home    Time spent: 28 minutes  Author: Alford Highland, MD 11/06/2022 1:57 PM  For on call review [email or web address].

## 2022-11-06 NOTE — ED Notes (Signed)
Report to john, rn

## 2022-11-06 NOTE — Assessment & Plan Note (Signed)
Right greater than left.  Will give 1 dose of Solu-Medrol today and back to his chronic prednisone tomorrow.

## 2022-11-06 NOTE — Assessment & Plan Note (Addendum)
BMI 35.70 with current height and weight in computer.

## 2022-11-06 NOTE — Assessment & Plan Note (Addendum)
Wondering if prostatitis was the cause of this all long.  Urine analysis does not look impressive.  Urine culture multiple species.  CT scan does not show any etiology of his pain.  Sepsis ruled out. Urinating better.

## 2022-11-06 NOTE — Assessment & Plan Note (Signed)
With hesitancy and symptoms.  Continue Flomax

## 2022-11-06 NOTE — Hospital Course (Addendum)
67 y.o. male with medical history significant of cirrhosis s/p liver transplant on chronic immunosuppressive therapy (tacrolimus and mycophenolate mofetil), frequent UTI on suppressive antibiotics, gout on chronic prednisone, HFpEF, MV thrombosis, hypertension who presents to the ED with abdominal pain.   Mr. Koppes states that for the last 4 weeks, he has been experiencing gradually worsening suprapubic and bilateral lower quadrant abdominal pain that is significantly worsened with urinating.  In addition, he notes that he has been urinating more frequently.  He notes that over the last several days, his symptoms have worsened more quickly.  He notes that over the last 24 to 48 hours, he has been experiencing nausea with vomiting and difficulty tolerating p.o. intake.  In addition, he awoke today with significant malaise.  He endorses recurrent hot and cold chills/sweats that have been going on for the last 2 weeks.  He denies any palpitations or shortness of breath at rest.   Mr. Cusato endorses dyspnea on exertion that seems to have gotten worse since his symptoms began but states that is otherwise chronic.  He endorses occasional chest pain that lasts for several minutes before self resolving.  He notes that episodes occur every 8 to 9 days.  5/22.  Reviewed CT scan of the abdomen and urine analysis.  Continue antibiotics for now.  Will give 1 dose of IV Solu-Medrol with bilateral sacroiliac pain. 5/23.  Patient had left lower extremity weakness and left facial numbness.  A code stroke was called.  CT angio and CT head negative.  Seen by teleneurology TNK given.  Patient's left leg weakness has improved. 5/24.  MRI of the brain negative for acute event.  Neurology started on aspirin and Plavix and increased dose of Crestor.  Transfer out of ICU.  Try to control blood pressure by increasing Norvasc to 10 mg daily. 525.  Had sweating episodes again last night.  Was having them before coming into the  hospital.  Prostate tender on exam.  Switch antibiotics over to Levaquin.  With blood pressure being high this morning I increased the lisinopril also.

## 2022-11-06 NOTE — Assessment & Plan Note (Addendum)
Continue to monitor as outpatient.  ALT slightly elevated at 72.  Total bilirubin 1.5.  AST 30, alkaline phosphatase 45.

## 2022-11-07 ENCOUNTER — Observation Stay: Payer: 59

## 2022-11-07 ENCOUNTER — Observation Stay (HOSPITAL_COMMUNITY)
Admit: 2022-11-07 | Discharge: 2022-11-07 | Disposition: A | Discharge status: Home / Self Care | Payer: 59 | Attending: Nurse Practitioner | Admitting: Nurse Practitioner

## 2022-11-07 DIAGNOSIS — Z800 Family history of malignant neoplasm of digestive organs: Secondary | ICD-10-CM | POA: Diagnosis not present

## 2022-11-07 DIAGNOSIS — Z8673 Personal history of transient ischemic attack (TIA), and cerebral infarction without residual deficits: Secondary | ICD-10-CM | POA: Diagnosis not present

## 2022-11-07 DIAGNOSIS — R7989 Other specified abnormal findings of blood chemistry: Secondary | ICD-10-CM

## 2022-11-07 DIAGNOSIS — I639 Cerebral infarction, unspecified: Secondary | ICD-10-CM

## 2022-11-07 DIAGNOSIS — R200 Anesthesia of skin: Secondary | ICD-10-CM | POA: Diagnosis not present

## 2022-11-07 DIAGNOSIS — R3912 Poor urinary stream: Secondary | ICD-10-CM

## 2022-11-07 DIAGNOSIS — E669 Obesity, unspecified: Secondary | ICD-10-CM | POA: Diagnosis present

## 2022-11-07 DIAGNOSIS — Z944 Liver transplant status: Secondary | ICD-10-CM | POA: Diagnosis not present

## 2022-11-07 DIAGNOSIS — N1832 Chronic kidney disease, stage 3b: Secondary | ICD-10-CM | POA: Diagnosis present

## 2022-11-07 DIAGNOSIS — N400 Enlarged prostate without lower urinary tract symptoms: Secondary | ICD-10-CM | POA: Diagnosis present

## 2022-11-07 DIAGNOSIS — Z87440 Personal history of urinary (tract) infections: Secondary | ICD-10-CM | POA: Diagnosis not present

## 2022-11-07 DIAGNOSIS — M1A9XX1 Chronic gout, unspecified, with tophus (tophi): Secondary | ICD-10-CM | POA: Diagnosis present

## 2022-11-07 DIAGNOSIS — N419 Inflammatory disease of prostate, unspecified: Secondary | ICD-10-CM | POA: Diagnosis present

## 2022-11-07 DIAGNOSIS — G43909 Migraine, unspecified, not intractable, without status migrainosus: Secondary | ICD-10-CM | POA: Diagnosis present

## 2022-11-07 DIAGNOSIS — Z981 Arthrodesis status: Secondary | ICD-10-CM | POA: Diagnosis not present

## 2022-11-07 DIAGNOSIS — N410 Acute prostatitis: Secondary | ICD-10-CM | POA: Diagnosis not present

## 2022-11-07 DIAGNOSIS — I6529 Occlusion and stenosis of unspecified carotid artery: Secondary | ICD-10-CM | POA: Diagnosis not present

## 2022-11-07 DIAGNOSIS — Z6835 Body mass index (BMI) 35.0-35.9, adult: Secondary | ICD-10-CM | POA: Diagnosis not present

## 2022-11-07 DIAGNOSIS — I6389 Other cerebral infarction: Secondary | ICD-10-CM

## 2022-11-07 DIAGNOSIS — R202 Paresthesia of skin: Secondary | ICD-10-CM | POA: Diagnosis not present

## 2022-11-07 DIAGNOSIS — M533 Sacrococcygeal disorders, not elsewhere classified: Secondary | ICD-10-CM | POA: Diagnosis not present

## 2022-11-07 DIAGNOSIS — R29818 Other symptoms and signs involving the nervous system: Secondary | ICD-10-CM | POA: Diagnosis not present

## 2022-11-07 DIAGNOSIS — R29810 Facial weakness: Secondary | ICD-10-CM | POA: Diagnosis present

## 2022-11-07 DIAGNOSIS — K219 Gastro-esophageal reflux disease without esophagitis: Secondary | ICD-10-CM | POA: Diagnosis present

## 2022-11-07 DIAGNOSIS — N390 Urinary tract infection, site not specified: Secondary | ICD-10-CM | POA: Diagnosis present

## 2022-11-07 DIAGNOSIS — R102 Pelvic and perineal pain: Secondary | ICD-10-CM | POA: Diagnosis not present

## 2022-11-07 DIAGNOSIS — R29706 NIHSS score 6: Secondary | ICD-10-CM | POA: Diagnosis present

## 2022-11-07 DIAGNOSIS — D84821 Immunodeficiency due to drugs: Secondary | ICD-10-CM | POA: Diagnosis present

## 2022-11-07 DIAGNOSIS — Z8249 Family history of ischemic heart disease and other diseases of the circulatory system: Secondary | ICD-10-CM | POA: Diagnosis not present

## 2022-11-07 DIAGNOSIS — I130 Hypertensive heart and chronic kidney disease with heart failure and stage 1 through stage 4 chronic kidney disease, or unspecified chronic kidney disease: Secondary | ICD-10-CM | POA: Diagnosis present

## 2022-11-07 DIAGNOSIS — G8194 Hemiplegia, unspecified affecting left nondominant side: Secondary | ICD-10-CM | POA: Diagnosis present

## 2022-11-07 DIAGNOSIS — I6621 Occlusion and stenosis of right posterior cerebral artery: Secondary | ICD-10-CM | POA: Diagnosis not present

## 2022-11-07 DIAGNOSIS — I5032 Chronic diastolic (congestive) heart failure: Secondary | ICD-10-CM | POA: Diagnosis present

## 2022-11-07 DIAGNOSIS — I10 Essential (primary) hypertension: Secondary | ICD-10-CM | POA: Diagnosis not present

## 2022-11-07 DIAGNOSIS — Z7952 Long term (current) use of systemic steroids: Secondary | ICD-10-CM | POA: Diagnosis not present

## 2022-11-07 LAB — ECHOCARDIOGRAM COMPLETE
AR max vel: 3.06 cm2
AV Area VTI: 3.14 cm2
AV Area mean vel: 2.86 cm2
AV Mean grad: 4 mmHg
AV Peak grad: 7.4 mmHg
Ao pk vel: 1.36 m/s
Area-P 1/2: 2.25 cm2
Height: 72 in
MV VTI: 4.25 cm2
S' Lateral: 3.3 cm
Weight: 4211.67 oz

## 2022-11-07 LAB — CULTURE, BLOOD (ROUTINE X 2)
Culture: NO GROWTH
Special Requests: ADEQUATE

## 2022-11-07 LAB — GLUCOSE, CAPILLARY
Glucose-Capillary: 199 mg/dL — ABNORMAL HIGH (ref 70–99)
Glucose-Capillary: 196 mg/dL — ABNORMAL HIGH (ref 70–99)

## 2022-11-07 LAB — URINE CULTURE

## 2022-11-07 LAB — PROTIME-INR
INR: 1 (ref 0.8–1.2)
Prothrombin Time: 13.5 seconds (ref 11.4–15.2)

## 2022-11-07 LAB — APTT: aPTT: 25 seconds (ref 24–36)

## 2022-11-07 MED ORDER — HYDRALAZINE HCL 20 MG/ML IJ SOLN
10.0000 mg | Freq: Once | INTRAMUSCULAR | Status: AC
  Administered 2022-11-07: 10 mg via INTRAVENOUS
  Filled 2022-11-07: qty 1

## 2022-11-07 MED ORDER — TENECTEPLASE FOR STROKE
PACK | INTRAVENOUS | Status: AC
  Administered 2022-11-07: 25 mg via INTRAVENOUS
  Filled 2022-11-07: qty 10

## 2022-11-07 MED ORDER — SODIUM CHLORIDE 0.9 % IV SOLN: INTRAVENOUS | Status: DC

## 2022-11-07 MED ORDER — STROKE: EARLY STAGES OF RECOVERY BOOK: Freq: Once | Status: AC

## 2022-11-07 MED ORDER — PROCHLORPERAZINE EDISYLATE 10 MG/2ML IJ SOLN
10.0000 mg | Freq: Once | INTRAMUSCULAR | Status: AC
  Administered 2022-11-07: 10 mg via INTRAVENOUS
  Filled 2022-11-07: qty 2

## 2022-11-07 MED ORDER — IOHEXOL 350 MG/ML SOLN
100.0000 mL | Freq: Once | INTRAVENOUS | Status: AC | PRN
  Administered 2022-11-07: 100 mL via INTRAVENOUS

## 2022-11-07 MED ORDER — LABETALOL HCL 5 MG/ML IV SOLN
20.0000 mg | INTRAVENOUS | Status: DC | PRN
  Administered 2022-11-07: 10 mg via INTRAVENOUS
  Filled 2022-11-07 (×2): qty 4

## 2022-11-07 MED ORDER — CLEVIDIPINE BUTYRATE 0.5 MG/ML IV EMUL
0.0000 mg/h | INTRAVENOUS | Status: DC
  Filled 2022-11-07: qty 50
  Filled 2022-11-07: qty 100

## 2022-11-07 MED ORDER — CHLORHEXIDINE GLUCONATE CLOTH 2 % EX PADS
6.0000 | MEDICATED_PAD | Freq: Every day | CUTANEOUS | Status: DC
  Administered 2022-11-07 – 2022-11-09 (×3): 6 via TOPICAL

## 2022-11-07 MED ORDER — SENNOSIDES-DOCUSATE SODIUM 8.6-50 MG PO TABS: 1.0000 | ORAL_TABLET | Freq: Every evening | ORAL | Status: DC | PRN

## 2022-11-07 MED ORDER — TENECTEPLASE FOR STROKE: 25.0000 mg | PACK | Freq: Once | INTRAVENOUS | Status: DC

## 2022-11-07 MED ORDER — TENECTEPLASE FOR STROKE
25.0000 mg | PACK | Freq: Once | INTRAVENOUS | Status: AC
  Filled 2022-11-07: qty 10

## 2022-11-07 NOTE — Progress Notes (Signed)
   11/07/22 0500  Spiritual Encounters  Type of Visit Attempt (pt unavailable)  Spiritual Care Plan  Spiritual Care Issues Still Outstanding Referring to oncoming chaplain for further support   Patient was taking to CT Scan there was no family. Patient was being move by medical team rapid respond.

## 2022-11-07 NOTE — Significant Event (Signed)
Rapid Response Event Note   Reason for Call :  Code Stroke Initial Focused Assessment:  Patient  being transferred from floor room to CT. Patient alert and oriented.  Patient with head ache 10/10, blurry vision in left eye, weakness and drift in left arm and left leg. CBG 191.    Symptoms starting at 0430 LKW; 0400 Interventions:  -CT -TNK -CTA -Transferred to ICU  Event Summary:  Code stroke called as patient got to CT After patients CT.  Tele Neuro RN Allen Derry and  Tele neuro DR Christella Hartigan assessed patient. TNK was order to be given by DR patylo. ED charge was contacted by this RN for assistance to obtain and administrate  TNK.  After time out was perform TNK was pushed by [facility] RN at [phone] while still in the CT room. Patient Transferred back over to CT table for CTA. Patient started to feel relief from head ache pain score of 0-10 dropping from a 10 to a 6 and regained ability to move both of his left limbs. Patient then transferred directly to ICU.  Upon patient arrival to ICU patients headache was down to a 4, blurry vision in left eye had resolved, no drift and patient able to move left limbs freely with no restriction.  MD Notified: [phone] Call Time:0529 Arrival [id number] End [id number]  Judyann Munson, RN

## 2022-11-07 NOTE — Consult Note (Signed)
NAME:  Devin White, MRN:  [id number], DOB:  06/15/1956, LOS: 0 ADMISSION DATE:  11/05/2022, CONSULTATION DATE:  11/07/2022 REFERRING MD:  Manuela Schwartz, NP, CHIEF COMPLAINT:  Left sided weakness   Brief Pt Description / Synopsis:  67 y.o. Male with PMHx significant for cirrhosis s/p Liver Transplant, HFpEF, and CKD Stage IIIb admitted with concern for UTI.  Hospital course complicated by left sided weakness concerning for stroke, status post TNK.  History of Present Illness:  Devin White is a 67 y.o. male with medical history significant of cirrhosis s/p liver transplant on chronic immunosuppressive therapy (tacrolimus and mycophenolate mofetil), frequent UTI on suppressive antibiotics, gout on chronic prednisone, HFpEF, MV thrombosis, hypertension who presents to the ED with abdominal pain.   Devin White states that for the last 4 weeks, he has been experiencing gradually worsening suprapubic and bilateral lower quadrant abdominal pain that is significantly worsened with urinating.  In addition, he notes that he has been urinating more frequently.  He notes that over the last several days, his symptoms have worsened more quickly.  He notes that over the last 24 to 48 hours, he has been experiencing nausea with vomiting and difficulty tolerating p.o. intake.  In addition, he awoke today with significant malaise.  He endorses recurrent hot and cold chills/sweats that have been going on for the last 2 weeks.  He denies any palpitations or shortness of breath at rest.   Devin White endorses dyspnea on exertion that seems to have gotten worse since his symptoms began but states that is otherwise chronic.  He endorses occasional chest pain that lasts for several minutes before self resolving.  He notes that episodes occur every 8 to 9 days.  ED Course: Initial Vital Signs: 157/98 with heart rate of 116.  He was saturating at 93 percent on room air.  He was afebrile 97.7.  Significant Labs:WBC  of 11.9, glucose of 333, creatinine 1.81, AST 56, ALT 120, and GFR 41. Lactic acid elevated at 3.5. Troponin elevated at 21 with flat trend to 21. BNP within normal limits at 33. Urinalysis was obtained that was negative for leukocytes, nitrites but rare bacteria.  Imaging  CT of the abdomen was obtained that demonstrated punctate bilateral nonobstructing renal stones and mild scattered colonic stool, with mild low-density along the wall of several loops of small bowel  Medications Administered: ceftriaxone, and IV fluids.   TRH asked to admit for further workup and treatment.  Please see "Significant Hospital Events" section below for full detailed hospital course.  Pertinent  Medical History   Past Medical History:  Diagnosis Date   Arthritis    back and legs   Benign prostatic hypertrophy    CHF (congestive heart failure) (HCC)    GERD (gastroesophageal reflux disease)    Gout    History of blood transfusion    pt has antibodies in his blood since previous transfusions   History of cirrhosis of liver S/P TRANSPLANT 2009   History of liver failure S/P TRANSPLANT   Hypertension    Left ureteral calculus    Renal disorder    Stroke (HCC)     Micro Data:  5/21: Blood culture x2>>no growth to date 5/21: Urine>> 5/22: HIV Screen>>nonreactive  Antimicrobials:   Anti-infectives (From admission, onward)    Start     Dose/Rate Route Frequency Ordered Stop   11/05/22 1830  cefTRIAXone (ROCEPHIN) 2 g in sodium chloride 0.9 % 100 mL IVPB  2 g 200 mL/hr over 30 Minutes Intravenous Every 24 hours 11/05/22 1825 11/12/22 1829       Significant Hospital Events: Including procedures, antibiotic start and stop dates in addition to other pertinent events   5/21: Admitted by [facility] for suspected UTI. 5/22:  Reviewed CT scan of the abdomen and urine analysis.  Continue antibiotics for now.  Will give 1 dose of IV Solu-Medrol with bilateral sacroiliac pain.  5/23: Developed acute  onset blurred vision and left sided weakness concerning for stroke.  Evaluated by Teleneurology, given TNK.  Transferred to ICU post TNK for close monitoring, PCCM consulted.  Interim History / Subjective:  -This morning pt acutely developed left sided weakness (particularly LLE) and left sided facial numbness -Teleneurology evaluated and decision was made to administer TNK -Symptoms have basically resolved following TNK -Transferred to ICU for frequent neuro check and for frequent monitoring of BP -Neurology to see pt today  Objective   Blood pressure (!) 165/108, pulse 72, temperature 98 F (36.7 C), temperature source Oral, resp. rate 14, height 6' (1.829 m), weight 119.4 kg, SpO2 95 %.        Intake/Output Summary (Last 24 hours) at 11/07/2022 0735 Last data filed at 11/07/2022 6045 Gross per 24 hour  Intake 580 ml  Output 1300 ml  Net -720 ml   Filed Weights   11/05/22 1545 11/07/22 0700  Weight: 120.2 kg 119.4 kg    Examination: General: Acutely ill-appearing male, laying in bed, on room air, no acute distress HENT: Atraumatic, normocephalic, neck supple, no JVD Lungs: Clear breath sounds throughout, even, nonlabored Cardiovascular: Regular rate and rhythm, S1-S2, no murmurs, rubs, gallops Abdomen: Obese, soft, nontender, nondistended, no guarding or rebound tenderness, bowel sounds positive x 4 Extremities: Normal bulk and tone, no deformities, no edema Neuro: Awake and alert, oriented x 4, moves all extremities to commands, focal deficits have resolved, speech clear GU: Deferred, patient voiding in urinal  Resolved Hospital Problem list     Assessment & Plan:   #Acute onset of blurred vision, headache, & left sided weakness concerning for possible Stroke, s/p TNK LKW time 04:00 on 5/23 ~ administered TNK @ 06:29 on 5/23 Initial Head CT negative CTA Head and negative negative for large vessel occlusion -ICU monitoring  -Frequent Neuro checks as per post  thrombolytic protocol -Strict BP control ~ GOAL BP <180/105 in first 24 hrs following TNK -Prn Labetalol and Cleviprex infusion if needed to maintain goal BP -NPO until swallowing screen performed -Speech/PT/OT consulted -NO anticoagulants or antiplatelets in 1st 24 hrs following TNK -Obtain MRI  -Obtain repeat CT Head for any change in Neuro exam -Echocardiogram pending -Consult Neurology, appreciate input -Maintain Euglycemia -Avoid hyperthermia, PRN acetaminophen  #Hypertension #Chronic HFpEF without acute exacerbation -Continuous cardiac monitoring -Maintain MAP >65 -BP goal as above following TNK -Prn Labetalol and Cleviprex as needed -Echocardiogram pending  #Stage IIb CKD -Monitor I&O's / urinary output -Follow BMP -Ensure adequate renal perfusion -Avoid nephrotoxic agents as able -Replace electrolytes as indicated  #Liver Cirrhosis s/p transplant -Continue Tacrolimus, Cellcept, and Prednisone as per primary service  #Acute Suprapubic pain with concern for possible UTI -Monitor fever curve -Trend WBC's & Procalcitonin -Follow cultures as above -Continue empiric Ceftriaxone pending cultures & sensitivities   Best Practice (right click and "Reselect all SmartList Selections" daily)   Diet/type: NPO, speech evaluation pending DVT prophylaxis: SCD GI prophylaxis: PPI Lines: N/A Foley:  N/A Code Status:  full code Last date of multidisciplinary goals of care  discussion [5/23]  5/23: Pt updated at bedside.  Labs   CBC: Recent Labs  Lab 11/05/22 1548 11/06/22 0629  WBC 11.9* 8.8  NEUTROABS 9.6*  --   HGB 17.0 15.0  HCT 51.5 45.3  MCV 94.8 95.0  PLT 186 144*    Basic Metabolic Panel: Recent Labs  Lab 11/05/22 1548 11/06/22 0629  NA 135 139  K 4.4 4.1  CL 101 107  CO2 23 24  GLUCOSE 333* 121*  BUN 20 18  CREATININE 1.81* 1.57*  CALCIUM 8.9 8.5*   GFR: Estimated Creatinine Clearance: 63.4 mL/min (A) (by C-G formula based on SCr of 1.57 mg/dL  (H)). Recent Labs  Lab 11/05/22 1548 11/05/22 1825 11/06/22 0629  WBC 11.9*  --  8.8  LATICACIDVEN 3.5* 3.4* 1.3    Liver Function Tests: Recent Labs  Lab 11/05/22 1548 11/06/22 0629  AST 56* 40  ALT 120* 93*  ALKPHOS 60 49  BILITOT 1.6* 1.3*  PROT 6.7 5.7*  ALBUMIN 4.2 3.5   No results for input(s): "LIPASE", "AMYLASE" in the last 168 hours. No results for input(s): "AMMONIA" in the last 168 hours.  ABG No results found for: "PHART", "PCO2ART", "PO2ART", "HCO3", "TCO2", "ACIDBASEDEF", "O2SAT"   Coagulation Profile: Recent Labs  Lab 11/06/22 0629  INR 1.1    Cardiac Enzymes: No results for input(s): "CKTOTAL", "CKMB", "CKMBINDEX", "TROPONINI" in the last 168 hours.  HbA1C: Hgb A1c MFr Bld  Date/Time Value Ref Range Status  01/30/2022 12:49 AM 5.1 4.8 - 5.6 % Final    Comment:    (NOTE) Pre diabetes:          5.7%-6.4%  Diabetes:              >6.4%  Glycemic control for   <7.0% adults with diabetes   10/26/2021 01:50 PM 6.2 (H) 4.8 - 5.6 % Final    Comment:    (NOTE) Pre diabetes:          5.7%-6.4%  Diabetes:              >6.4%  Glycemic control for   <7.0% adults with diabetes     CBG: Recent Labs  Lab 11/07/22 0530  GLUCAP 199*    Review of Systems:   Positives in BOLD: Gen: Denies fever, chills, weight change, fatigue, night sweats HEENT: Denies blurred vision, double vision, hearing loss, tinnitus, sinus congestion, rhinorrhea, sore throat, neck stiffness, dysphagia PULM: Denies shortness of breath, cough, sputum production, hemoptysis, wheezing CV: Denies chest pain, edema, orthopnea, paroxysmal nocturnal dyspnea, palpitations GI: Denies abdominal pain, nausea, vomiting, diarrhea, hematochezia, melena, constipation, change in bowel habits GU: Denies dysuria, hematuria, polyuria, oliguria, urethral discharge Endocrine: Denies hot or cold intolerance, polyuria, polyphagia or appetite change Derm: Denies rash, dry skin, scaling or  peeling skin change Heme: Denies easy bruising, bleeding, bleeding gums Neuro: Denies headache, numbness, weakness, slurred speech, loss of memory or consciousness   Past Medical History:  He,  has a past medical history of Arthritis, Benign prostatic hypertrophy, CHF (congestive heart failure) (HCC), GERD (gastroesophageal reflux disease), Gout, History of blood transfusion, History of cirrhosis of liver (S/P TRANSPLANT 2009), History of liver failure (S/P TRANSPLANT), Hypertension, Left ureteral calculus, Renal disorder, and Stroke (HCC).   Surgical History:   Past Surgical History:  Procedure Laterality Date   APPENDECTOMY     bone morrow biopsy     CHOLECYSTECTOMY  2007   CYSTO/ LEFT RETROGRADE PYELOGRAM/ LEFT URETERAL STENT PLACEMENT  03-05-2012  DR Berneice Heinrich (  [facility])   LEFT URETERAL CALCULI   CYSTOSCOPY W/ URETERAL STENT PLACEMENT  03/11/2012   Procedure: CYSTOSCOPY WITH STENT REPLACEMENT;  Surgeon: Sebastian Ache, MD;  Location: [facility];  Service: Urology;  Laterality: Left;   HERNIA REPAIR  2008   LIVER BIOPSY     LIVER TRANSPLANT  11/24/2007   pt states doing well since liver transplant   LUMBAR DISC SURGERY     LUMBAR FUSION     removal of fistula     URETEROSCOPY  03/11/2012   Procedure: URETEROSCOPY;  Surgeon: Sebastian Ache, MD;  Location: [facility];  Service: Urology;  Laterality: Left;   STONE MANIPULATION, stone obtained  [phone] [facility]     Social History:   reports that he has never smoked. He has been exposed to tobacco smoke. He has never used smokeless tobacco. He reports that he does not drink alcohol and does not use drugs.   Family History:  His family history includes Alcohol abuse in his maternal aunt, maternal uncle, maternal uncle, and maternal uncle; Arthritis in his brother, father, and mother; Cancer in his father and mother; Diabetes in his maternal aunt and paternal aunt; Hypertension in his mother; Kidney disease in  his father; Vasculitis in his mother.   Allergies No Known Allergies   Home Medications  Prior to Admission medications   Medication Sig Start Date End Date Taking? Authorizing Provider  acetaminophen (TYLENOL) 325 MG tablet Take 650 mg by mouth every 6 (six) hours as needed for mild pain.   Yes [provider]  amLODipine (NORVASC) 5 MG tablet Take 5 mg by mouth daily.   Yes [provider]  aspirin 325 MG tablet Take 1 tablet (325 mg total) by mouth daily. 11/07/21  Yes Kathlen Mody, MD  calcium carbonate (TUMS) 500 MG chewable tablet Chew 1 tablet (200 mg of elemental calcium total) by mouth 2 (two) times daily as needed for indigestion or heartburn. 06/26/16  Yes Joaquim Nam, MD  furosemide (LASIX) 40 MG tablet TAKE 1 TABLET(40 MG) BY MOUTH DAILY Patient taking differently: Take 40 mg by mouth daily. 10/14/22  Yes Tillman Abide I, MD  lisinopril (ZESTRIL) 5 MG tablet Take 1 tablet (5 mg total) by mouth daily. 09/19/22  Yes Karie Schwalbe, MD  Multiple Vitamin (MULTIVITAMIN WITH MINERALS) TABS tablet Take 1 tablet by mouth daily. 11/07/21  Yes Kathlen Mody, MD  mycophenolate (CELLCEPT) 250 MG capsule Take 250-500 mg by mouth 2 (two) times daily. 2 capsules (500 mg) in the morning and one capsule (250 mg) in the evening 02/04/19  Yes [provider]  omeprazole (PRILOSEC) 20 MG capsule Take 1 capsule (20 mg total) by mouth daily. 02/19/22  Yes Karie Schwalbe, MD  predniSONE (DELTASONE) 10 MG tablet Take 10 mg by mouth daily with breakfast.   Yes [provider]  tacrolimus (PROGRAF) 0.5 MG capsule Take 0.5-1 mg by mouth See admin instructions. Taking 0.5 mg in the morning and 1 mg at bedtime   Yes [provider]  tamsulosin (FLOMAX) 0.4 MG CAPS capsule TAKE 1 CAPSULE(0.4 MG) BY MOUTH DAILY Patient taking differently: Take 0.4 mg by mouth daily. 12/17/21  Yes Karie Schwalbe, MD  trimethoprim (TRIMPEX) 100 MG tablet TAKE 1 TABLET(100 MG) BY  MOUTH DAILY FOR UTI PREVENTION Patient taking differently: Take 100 mg by mouth daily. 10/14/22  Yes Karie Schwalbe, MD  ascorbic acid (VITAMIN C) 500 MG tablet Take 1 tablet (500  mg total) by mouth daily. Patient not taking: Reported on 11/05/2022 11/07/21   Kathlen Mody, MD  colchicine 0.6 MG tablet Take 1 tablet (0.6 mg total) by mouth daily as needed. Patient not taking: Reported on 05/06/2022 11/06/21   Kathlen Mody, MD  rosuvastatin (CRESTOR) 5 MG tablet Take 1 tablet (5 mg total) by mouth every evening. Patient not taking: Reported on 05/06/2022 11/06/21   Kathlen Mody, MD  senna (SENOKOT) 8.6 MG TABS tablet Take 2 tablets (17.2 mg total) by mouth daily. 01/01/22   Alwyn Ren, MD  SUMAtriptan (IMITREX) 100 MG tablet Take 1 tablet (100 mg total) by mouth once as needed for migraine (may repeat x 1 in 24 hrs not more than 2 days per week. total 9 tablets). May repeat in 2 hours if headache persists or recurs. Patient not taking: Reported on 05/06/2022 11/06/21 11/07/22  Kathlen Mody, MD  vitamin A 3 MG (10000 UNITS) capsule Take 1 capsule (10,000 Units total) by mouth daily. Patient not taking: Reported on 11/05/2022 11/07/21   Kathlen Mody, MD  zinc sulfate 220 (50 Zn) MG capsule Take 1 capsule (220 mg total) by mouth daily. Patient not taking: Reported on 11/05/2022 11/07/21   Kathlen Mody, MD     Critical care time: 50 minutes     Harlon Ditty, AGACNP-BC [facility] Prefer epic messenger for cross cover needs If after hours, please call E-link

## 2022-11-07 NOTE — Consult Note (Signed)
Neurology Consultation Reason for Consult: Left-sided weakness Referring Physician: Renae Gloss, R  CC: Left-sided weakness  History is obtained from: Patient  HPI: Devin White is a 67 y.o. male with a history of previous stroke who is currently hospitalized with complicated UTI who was normal at 4 AM and then fell asleep for a few minutes then awoke later with left-sided weakness.  He states that he is unable to lift his left leg very well at all.  A code stroke was activated and he was evaluated by teleneurology who deemed him a candidate for IV tenecteplase and this was administered with rapid improvement in his symptoms.  He now feels almost back to baseline.  LKW: 4 AM tnk given?:  Yes  Past Medical History:  Diagnosis Date   Arthritis    back and legs   Benign prostatic hypertrophy    CHF (congestive heart failure) (HCC)    GERD (gastroesophageal reflux disease)    Gout    History of blood transfusion    pt has antibodies in his blood since previous transfusions   History of cirrhosis of liver S/P TRANSPLANT 2009   History of liver failure S/P TRANSPLANT   Hypertension    Left ureteral calculus    Renal disorder    Stroke [facility])     Family History  Problem Relation Age of Onset   Cancer Mother        colon   Arthritis Mother    Vasculitis Mother    Hypertension Mother    Cancer Father        colon   Kidney disease Father    Arthritis Father    Arthritis Brother    Alcohol abuse Maternal Aunt    Diabetes Maternal Aunt    Alcohol abuse Maternal Uncle    Diabetes Paternal Aunt    Alcohol abuse Maternal Uncle    Alcohol abuse Maternal Uncle      Social History:  reports that he has never smoked. He has been exposed to tobacco smoke. He has never used smokeless tobacco. He reports that he does not drink alcohol and does not use drugs.   Exam: Current vital signs: BP 119/80   Pulse 80   Temp 98 F (36.7 C) (Oral)   Resp (!) 22   Ht 6' (1.829 m)   Wt  119.4 kg   SpO2 95%   BMI 35.70 kg/m  Vital signs in last 24 hours: Temp:  [97.5 F (36.4 C)-98 F (36.7 C)] 98 F (36.7 C) (05/23 0700) Pulse Rate:  [66-85] 80 (05/23 1445) Resp:  [10-28] 22 (05/23 1445) BP: (119-194)/(80-128) 119/80 (05/23 1445) SpO2:  [89 %-97 %] 95 % (05/23 1445) Weight:  [119.4 kg] 119.4 kg (05/23 0700)   Physical Exam  Appears well-developed and well-nourished.   Neuro: Mental Status: Patient is awake, alert, oriented to person, place, month, year, and situation. Patient is able to give a clear and coherent history. No signs of aphasia or neglect I do think that he is mildly dysarthric. Cranial Nerves: II: Visual Fields are full. Pupils are equal, round, and reactive to light.   III,IV, VI: EOMI without ptosis or diploplia.  V: Facial sensation is symmetric to temperature VII: Facial movement is symmetric.  VIII: hearing is intact to voice X: Uvula elevates symmetrically XI: Shoulder shrug is symmetric. XII: tongue is midline without atrophy or fasciculations.  Motor: Tone is normal. Bulk is normal. 5/5 strength was present in all four extremities.  Sensory:  Sensation is symmetric to light touch and temperature in the arms and legs. Cerebellar: No clear ataxia     I have reviewed labs in epic and the results pertinent to this consultation are: Cr 1.57  I have reviewed the images obtained:CT/CTA - no acute findign, but he does have significant intracranial athero  Impression: 67 year old male with a history of multiple stroke risk factors including previous stroke who had transient left sided weakness, predominantly in the leg earlier today that resolved with tenecteplase.  Possibilities include TIA versus stroke aborted with thrombolytic therapy versus acute ischemic stroke.  Secondary stroke risk factor modification is underway.  Recommendations: - HgbA1c, fasting lipid panel - MRI of the brain without contrast - Frequent neuro checks -  Echocardiogram - Prophylactic therapy-none for 24 hours - Risk factor modification - Telemetry monitoring - PT consult, OT consult, Speech consult    Ritta Slot, MD [facility] [phone]  If 7pm- 7am, please page neurology on call as listed in AMION.

## 2022-11-07 NOTE — Progress Notes (Signed)
PT Cancellation Note  Patient Details Name: ANTAWON MAYHUGH MRN: [id number] DOB: [birth year]   Cancelled Treatment:    Reason Eval/Treat Not Completed: Medical issues which prohibited therapy.  PT consult received.  Chart reviewed.  Pt transferred to ICU this morning after code stroke called and TNKase given.  New PT/OT orders received.  Pt currently on strict bed rest and pending follow-up imaging 11/08/22.  Will hold therapy at this time and re-attempt PT evaluation tomorrow once off of bedrest/cleared to participate.  Hendricks Limes, PT 11/07/22, 9:20 AM

## 2022-11-07 NOTE — Progress Notes (Signed)
       CROSS COVER NOTE  NAME: Devin White MRN: [id number] DOB : [birth year]    HPI/Events of Note   Report: Received via secure chat "Patient's BP 165/107. I checked manual it is 180/108 patient has a headache and slightly blurred vision  Patient was treated with norco for suprapubic pain Patient states when he's in the hospital his BP gets high and he has ministrokes Recheck BP manual 182/104 with increased blurred vision and war ringing"  On review of chart:patient admitted with suprapubic pain from UTI. Patietn history of liver transplant on immunosuppressive therapy. MV thrombosis, CKD 3. BPH, chronic diastolic heart failure No history of CVA found. He does however have history of cataract in left eye, same side in which he reports blurred vsion      Assessment and  Interventions   Assessment:  Plan: Hydralazine 10 mg IV x 1 Patient with worsening symptoms post hydralazine Code stroke called Met patient at CT tele neuro Ct head without contrast ordered and completed negative for hemorrhage or acute findings Neurologis Dr Christella Hartigan evaluated patient and found weakness left side as well as decreased sensation in addition to headache left ey blurred vision and left ear ringing as well as left facial numbness Decision was made to give TNKase after discussion of risks and benefits of med between patient and Dr Dewayne Hatch Time out performed Med given 0628 AM  Perfusion studies ordered by neurology while in CT Critical Care consul - Discussed with Webb Silversmith NP       Donnie Mesa NP [facility] Cross Cover 7pm-7am - check amion for availability Pager [phone]

## 2022-11-07 NOTE — Progress Notes (Signed)
Patient's BP 165/107. RN checked a manual BP it is 180/108. Patient has a headache and slightly blurred vision.   RN administered PRN Norco for suprapubic pain and headache.   Patient has no prn BP medications ordered. But the patient states every time he is in the hospital his BP gets high, but when the systolic increases >180 he has had mini strokes and a CVA in the past   RN notified Manuela Schwartz, NP of the above information.

## 2022-11-07 NOTE — Progress Notes (Signed)
OT Cancellation Note  Patient Details Name: TITUS RIPPEL MRN: [id number] DOB: 21-Jan-1956   Cancelled Treatment:   OT evaluation order received. Reason Eval/Treat Not Completed: Medical issues which prohibited therapy (Pt just recevied TNK. Hold, per protocol.)  Alvester Morin 11/07/2022, 9:31 AM

## 2022-11-07 NOTE — Progress Notes (Signed)
Patient requested this RN notify his Daughter that he was transferred to the ICU Rm 15.   RN notified The patient's Daughter via phone call.   RN brought all of the patient's belongings to the IVU and sent his charger via tube station.

## 2022-11-07 NOTE — Plan of Care (Signed)

## 2022-11-07 NOTE — Progress Notes (Signed)
Progress Note   Patient: Devin White NFA:[id number] DOB: Jan 27, 1956 DOA: 11/05/2022     0 DOS: the patient was seen and examined on 11/07/2022   Brief hospital course: 66 y.o. male with medical history significant of cirrhosis s/p liver transplant on chronic immunosuppressive therapy (tacrolimus and mycophenolate mofetil), frequent UTI on suppressive antibiotics, gout on chronic prednisone, HFpEF, MV thrombosis, hypertension who presents to the ED with abdominal pain.   Devin White states that for the last 4 weeks, he has been experiencing gradually worsening suprapubic and bilateral lower quadrant abdominal pain that is significantly worsened with urinating.  In addition, he notes that he has been urinating more frequently.  He notes that over the last several days, his symptoms have worsened more quickly.  He notes that over the last 24 to 48 hours, he has been experiencing nausea with vomiting and difficulty tolerating p.o. intake.  In addition, he awoke today with significant malaise.  He endorses recurrent hot and cold chills/sweats that have been going on for the last 2 weeks.  He denies any palpitations or shortness of breath at rest.   Devin White endorses dyspnea on exertion that seems to have gotten worse since his symptoms began but states that is otherwise chronic.  He endorses occasional chest pain that lasts for several minutes before self resolving.  He notes that episodes occur every 8 to 9 days.  5/22.  Reviewed CT scan of the abdomen and urine analysis.  Continue antibiotics for now.  Will give 1 dose of IV Solu-Medrol with bilateral sacroiliac pain. 5/23.  Patient had left lower extremity weakness and left facial numbness.  A code stroke was called.  CT angio and CT head negative.  Seen by teleneurology TNK given.  Patient's left leg weakness has improved.  Assessment and Plan: * Acute CVA (cerebrovascular accident) (HCC) TNK this morning for left leg weakness and left facial  numbness and headache.  MRI and echocardiogram ordered.  Case discussed with neurology.  Hypertension Continue Norvasc and lisinopril. As needed iv labetalol. As needed drip.  Acute suprapubic pain Continue antibiotics for now.  Urine analysis does not look impressive.  Urine culture multiple species.  CT scan does not show any etiology of his pain.  Sepsis ruled out. Urinating better.  Pain of both sacroiliac joints Right greater than left.  Chronic prednisone  Stage 3b chronic kidney disease (HCC) Creatinine 1.81 on presentation down to 1.57 with IV fluids  Liver transplant status (HCC) History of cirrhosis s/p liver transplantation on tacrolimus and CellCept and prednisone.  Chronic diastolic heart failure (HCC) No signs of heart failure.  Elevated liver function tests Continue to monitor  Obesity (BMI 30-39.9) BMI 35.94 with current height and weight in computer.  BPH (benign prostatic hyperplasia) With hesitancy and symptoms.  Continue Flomax  Chronic tophaceous gout - Continue home prednisone        Subjective: Patient developed headache and numbness left side of the face and weakness on his left leg.  A code stroke was called this morning.  TNK given by teleneurology.  Patient's weakness is better.  Physical Exam: Vitals:   11/07/22 1015 11/07/22 1045 11/07/22 1115 11/07/22 1145  BP: (!) 183/106 (!) 169/105 (!) 168/99 (!) 164/108  Pulse: 73 75 73 66  Resp: 18 10 11 12   Temp:      TempSrc:      SpO2: 94% 97% 95% 95%  Weight:      Height:  Physical Exam HENT:     Head: Normocephalic.     Mouth/Throat:     Pharynx: No oropharyngeal exudate.  Eyes:     General: Lids are normal.     Conjunctiva/sclera: Conjunctivae normal.  Cardiovascular:     Rate and Rhythm: Normal rate and regular rhythm.     Heart sounds: Normal heart sounds, S1 normal and S2 normal.  Abdominal:     Palpations: Abdomen is soft.     Tenderness: There is no abdominal  tenderness.  Musculoskeletal:     Right lower leg: No swelling.     Left lower leg: No swelling.     Comments: Bilateral sacroiliac pain worse on the right than the left.  Skin:    General: Skin is warm.     Findings: No rash.  Neurological:     Mental Status: He is alert and oriented to person, place, and time.     Comments: Able to straight leg rise     Data Reviewed: CT head and CT angio head and neck negative  Family Communication: spoke with daughter at bedside  Disposition: Status is: since received TNK this am will continue to monitor, obtain mri brain and echo  Planned Discharge Destination: Home    Time spent: 30 minutes  Author: Alford Highland, MD 11/07/2022 12:49 PM  For on call review [email or web address].

## 2022-11-07 NOTE — Progress Notes (Signed)
Patient states his Vision is becoming more blury on the L eye and his L ear is beginning to ring   RN notified Manuela Schwartz, NP of the above information.  Order Received: Recheck BP  BP 182/104 manual. NP notified  Orders Received: 10mg  IV hydralazine  Intervention: IV Hydralazine administered at 0455

## 2022-11-07 NOTE — Evaluation (Signed)
Speech Language Pathology Evaluation Patient Details Name: Devin White MRN: [id number] DOB: [birth year] Today's Date: 11/07/2022 Time: 1210-1230 SLP Time Calculation (min) (ACUTE ONLY): 20 min  Problem List:  Patient Active Problem List   Diagnosis Date Noted   Acute CVA (cerebrovascular accident) (HCC) 11/07/2022   Acute suprapubic pain 11/06/2022   Pain of both sacroiliac joints 11/06/2022   Elevated liver function tests 11/06/2022   Acute pyelonephritis 01/31/2022   Sepsis (HCC) 01/31/2022   Hypokalemia 12/27/2021   Severe sepsis (HCC) 12/26/2021   Acute UTI (urinary tract infection) 12/26/2021   Left sided numbness 11/14/2021   Obesity (BMI 30-39.9) 10/27/2021   Diarrhea 10/26/2021   Stage 3a chronic kidney disease (CKD) (HCC) 10/26/2021   Hyperbilirubinemia 10/26/2021   Hyperglycemia 10/26/2021   Chronic diastolic heart failure (HCC) 09/05/2021   Facial swelling 04/26/2021   Gastric intestinal metaplasia 01/01/2021   Sick euthyroidism 10/03/2020   Achilles tendon mass 09/25/2020   Joint pain 09/22/2020   Recurrent UTI 03/26/2019   Pulmonary nodule 08/04/2018   Liver transplant status (HCC) 10/10/2017   Advance directive discussed with patient 10/10/2017   Stage 3b chronic kidney disease (HCC) 10/09/2016   Mallory-Weiss tear 04/16/2016   Long-term use of immunosuppressant medication 04/08/2016   Sleep apnea 10/06/2015   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   Immunosuppression (HCC) 06/01/2012   Hypertension    Routine general medical examination at a health care facility 04/26/2011   Chronic tophaceous gout 12/21/2008   BPH (benign prostatic hyperplasia) 12/21/2008   ALLERGIC RHINITIS 05/22/2007   GERD 05/22/2007   HIATAL HERNIA 05/22/2007   IRRITABLE BOWEL SYNDROME, HX OF 05/22/2007   RENAL CALCULUS, HX OF 05/22/2007   Past Medical History:  Past Medical History:  Diagnosis Date   Arthritis    back and legs   Benign  prostatic hypertrophy    CHF (congestive heart failure) (HCC)    GERD (gastroesophageal reflux disease)    Gout    History of blood transfusion    pt has antibodies in his blood since previous transfusions   History of cirrhosis of liver S/P TRANSPLANT 2009   History of liver failure S/P TRANSPLANT   Hypertension    Left ureteral calculus    Renal disorder    Stroke [facility])    Past Surgical History:  Past Surgical History:  Procedure Laterality Date   APPENDECTOMY     bone morrow biopsy     CHOLECYSTECTOMY  2007   CYSTO/ LEFT RETROGRADE PYELOGRAM/ LEFT URETERAL STENT PLACEMENT  03-05-2012  DR Berneice Heinrich [facility])   LEFT URETERAL CALCULI   CYSTOSCOPY W/ URETERAL STENT PLACEMENT  03/11/2012   Procedure: CYSTOSCOPY WITH STENT REPLACEMENT;  Surgeon: Sebastian Ache, MD;  Location: [facility];  Service: Urology;  Laterality: Left;   HERNIA REPAIR  2008   LIVER BIOPSY     LIVER TRANSPLANT  11/24/2007   pt states doing well since liver transplant   LUMBAR DISC SURGERY     LUMBAR FUSION     removal of fistula     URETEROSCOPY  03/11/2012   Procedure: URETEROSCOPY;  Surgeon: Sebastian Ache, MD;  Location: [facility];  Service: Urology;  Laterality: Left;   STONE MANIPULATION, stone obtained  [phone] [facility] MCR   HPI:  Per H&P "Devin White is a 67 y.o. male with medical history significant of cirrhosis s/p liver transplant on chronic immunosuppressive therapy (tacrolimus and mycophenolate mofetil), frequent UTI on suppressive antibiotics, gout on  chronic prednisone, HFpEF, MV thrombosis, hypertension who presents to the ED with abdominal pain.     Devin White states that for the last 4 weeks, he has been experiencing gradually worsening suprapubic and bilateral lower quadrant abdominal pain that is significantly worsened with urinating.  In addition, he notes that he has been urinating more frequently.  He notes that over the last several days, his symptoms have  worsened more quickly.  He notes that over the last 24 to 48 hours, he has been experiencing nausea with vomiting and difficulty tolerating p.o. intake.  In addition, he awoke today with significant malaise.  He endorses recurrent hot and cold chills/sweats that have been going on for the last 2 weeks.  He denies any palpitations or shortness of breath at rest.     Devin White endorses dyspnea on exertion that seems to have gotten worse since his symptoms began but states that is otherwise chronic.  He endorses occasional chest pain that lasts for several minutes before self resolving.  He notes that episodes occur every 8 to 9 days." Code stroke called 5/23 due to high BP and c/o blurred vision, pt s/p TNK. Head CT, 5/23, "1. No acute cortically based infarct or acute intracranial  hemorrhage identified. ASPECTS 10.  2. Stable non contrast CT appearance of white matter disease since  last year." MRI pending.   Assessment / Plan / Recommendation Clinical Impression  Pt seen for cognitive-linguistic evaluation. Assessment completed via informal means and portions of Cognistat. Pt demonstrated intact basic speech/language and cognitive-linguistic ability. Pt denies changes to speech/language or cognition. SLP to sign off as pt as no acute SLP needs.    SLP Assessment  SLP Recommendation/Assessment: Patient does not need any further Speech Lanaguage Pathology Services    Recommendations for follow up therapy are one component of a multi-disciplinary discharge planning process, led by the attending physician.  Recommendations may be updated based on patient status, additional functional criteria and insurance authorization.    Follow Up Recommendations  No SLP follow up    Assistance Recommended at Discharge   (defer to OT/PT)  Functional Status Assessment Patient has not had a recent decline in their functional status  Frequency and Duration           SLP Evaluation Cognition  Overall Cognitive  Status: Within Functional Limits for tasks assessed Arousal/Alertness: Awake/alert Orientation Level: Oriented X4 Attention:  (WFL) Memory: Appears intact Awareness: Appears intact Problem Solving: Appears intact Executive Function:  (WFL) Safety/Judgment: Appears intact       Comprehension  Auditory Comprehension Overall Auditory Comprehension: Appears within functional limits for tasks assessed Yes/No Questions: Within Functional Limits Commands: Within Functional Limits Conversation: Complex [facility]) Visual Recognition/Discrimination Discrimination: Not tested Reading Comprehension Reading Status: Not tested    Expression Expression Primary Mode of Expression: Verbal Verbal Expression Overall Verbal Expression: Appears within functional limits for tasks assessed Initiation: No impairment Automatic Speech: Social Response (WFL) Repetition: No impairment Naming: No impairment Pragmatics: No impairment   Oral / Motor  Oral Motor/Sensory Function Overall Oral Motor/Sensory Function: Within functional limits Motor Speech Overall Motor Speech: Appears within functional limits for tasks assessed Respiration: Within functional limits Phonation: Normal Resonance: Within functional limits Articulation: Within functional limitis Intelligibility: Intelligible Motor Planning: Witnin functional limits           Clyde Canterbury, M.S., CCC-SLP Speech-Language Pathologist [facility] [phone] (ASCOM)  Woodroe Chen 11/07/2022, 12:49 PM

## 2022-11-07 NOTE — Progress Notes (Signed)
   11/07/22 1100  Spiritual Encounters  Type of Visit Initial  Care provided to: Pt and family  Referral source Chaplain team  Reason for visit Routine spiritual support  OnCall Visit Yes   Chaplain followed up for night chaplain to provide compassionate care to patient and family.

## 2022-11-07 NOTE — Consult Note (Addendum)
Addendum: CTA H/N appears neg on my review  [facility] TeleNeurology Consult Services   Patient Name:   Devin White, Devin White Date of Birth:   1955/09/28 Identification Number:   MRN - [id number] Date of Service:   11/07/2022 05:44:09  Diagnosis:       I63.89 - Cerebrovascular accident (CVA) due to other mechanism (HCCC)       R51.9 - Headache, unspecified  Impression:      This is a 67 yo man who is currently admitted for abd pain. This morning, he developed a headache with left sided weakness and numbness. He does seem weaker on exam on the left side. Certainly stroke is a possibility and he has no contraindications to tenecteplase so it was given with his understanding of the risks and benefits.    Low suspicion of LVO but I did order a CTA H/N, this is pending.  Our recommendations are outlined below. Recommendations: IV Tenecteplase recommended.  I confirmed the following. (Patient name, DOB, MRN, Blood Pressure, dose of Thrombolytic and waste, weight completed by stretcher/scale not stated weight, have ED staff inform ED MD of thrombolytic decision) Thrombolytic bolus given Without Complication.   IV Tenecteplase Total Dose - 25.0 mg   Routine post Thrombolytic monitoring including neuro checks and blood pressure control during/after treatment Monitor blood pressure Check blood pressure and neuro assessment every 15 min for 2 h, then every 30 min for 6 h, and finally every hour for 16 h.  Manage Blood Pressure per post Thrombolytic protocol.        Follow designated hospital protocol for admission and post thrombolytic care       CT brain 24 hours post Thrombolytic       NPO until swallowing screen performed and passed       No antiplatelet agents or anticoagulants (including heparin for DVT prophylaxis) in first 24 hours       No Foley catheter, nasogastric tube, arterial catheter or central venous catheter for 24 hr, unless absolutely necessary       Telemetry        Bedside swallow evaluation       HOB less than 30 degrees       Euglycemia       Avoid hyperthermia, PRN acetaminophen       DVT prophylaxis       Inpatient Neurology Consultation       Stroke evaluation as per inpatient neurology recommendations  Discussed with ED physician    ------------------------------------------------------------------------------  Advanced Imaging: Advanced imaging has been ordered. Results pending.   Metrics: Last Known Well: 11/07/2022 04:00:00 [facility] Notification Time: 11/07/2022 05:44:09 Stamp Time: 11/07/2022 05:44:09 Initial Response Time: 11/07/2022 05:51:49 Symptoms: left wakness and numbness. Initial patient interaction: 11/07/2022 06:02:13 NIHSS Assessment Completed: 11/07/2022 78:29:56 Patient is a candidate for Thrombolytic. Thrombolytic Medical Decision: 11/07/2022 06:17:00 Needle Time: 11/07/2022 06:29:11 Weight Noted by Staff: 119.4 kg  CT head showed no acute hemorrhage or acute core infarct.  Primary Provider Notified of Diagnostic Impression and Management Plan on: 11/07/2022 06:36:29 Spoke With: Manuela Schwartz Able to Reach 11/07/2022 06:36:29    ------------------------------------------------------------------------------  Thrombolytic Contraindications:  Last Known Well > 4.5 hours: No CT Head showing hemorrhage: No Ischemic stroke within 3 months: No Severe head trauma within 3 months: No Intracranial/intraspinal surgery within 3 months: No History of intracranial hemorrhage: No Symptoms and signs consistent with an SAH: No GI malignancy or GI bleed within 21 days: No Coagulopathy: Platelets <100 000 /mm3, INR >1.7, aPTT>40 s,  or PT >15 s: No Treatment dose of LMWH within the previous 24 hrs: No Use of NOACs in past 48 hours: No Glycoprotein IIb/IIIa receptor inhibitors use: No Symptoms consistent with infective endocarditis: No Suspected aortic arch dissection: No Intra-axial intracranial neoplasm:  No  Thrombolytic Decision and Management Plan: Management with thrombolytic treatment was explained to the Patient as was risks and benefits and alternatives to the treatment. Patient agrees with the decision to proceed with thrombolytic treatment. . All questions were answered and the Patient expressed understanding of the treatment plan.   History of Present Illness: Patient is a 67 year old Male.  Inpatient stroke alert was called for symptoms of left wakness and numbness. This is a 67 yo man who presented for evaluation of abdominal pain. He is currently admitted and a stroke alert is called this morning for headache and left sided weakness as well as blurry vision.  He said he woke up at 4am with the headache but did not have any of the focal symptoms until afterwards. He was awake at 3am and definitely felt completely normal.  He had similar symptoms about a year ago with a negative MRI brain.  Headache today is left sided with some light sensitivity.  Head CT did not show acute pathology.   Past Medical History:      Hypertension [place] PMH:  liver transplant  gout  heart failure  Medications:  No Anticoagulant use  Antiplatelet use: Yes aspirin Reviewed EMR for current medications  Allergies:  NKDA  Social History: Smoking: No  Family History:  There is no family history of premature cerebrovascular disease pertinent to this consultation  ROS : 14 Points Review of Systems was performed and was negative except mentioned in HPI.  Past Surgical History: There Is No Surgical History Contributory To Today's Visit   Examination: BP(160/109), Pulse(82), Blood Glucose(190) 1A: Level of Consciousness - Alert; keenly responsive + 0 1B: Ask Month and Age - Both Questions Right + 0 1C: Blink Eyes & Squeeze Hands - Performs Both Tasks + 0 2: Test Horizontal Extraocular Movements - Normal + 0 3: Test Visual Fields - No Visual Loss + 0 4: Test Facial Palsy (Use  Grimace if Obtunded) - Normal symmetry + 0 5A: Test Left Arm Motor Drift - Drift, but doesn't hit bed + 1 5B: Test Right Arm Motor Drift - No Drift for 10 Seconds + 0 6A: Test Left Leg Motor Drift - Drift, hits bed + 2 6B: Test Right Leg Motor Drift - No Drift for 5 Seconds + 0 7: Test Limb Ataxia (FNF/Heel-Shin) - No Ataxia + 0 8: Test Sensation - Mild-Moderate Loss: Less Sharp/More Dull + 1 9: Test Language/Aphasia - Normal; No aphasia + 0 10: Test Dysarthria - Normal + 0 11: Test Extinction/Inattention - No abnormality + 0  NIHSS Score: 4  Pre-Morbid Modified Rankin Scale: 0 Points = No symptoms at all  Spoke with : Manuela Schwartz  Patient/Family was informed the Neurology Consult would occur via TeleHealth consult by way of interactive audio and video telecommunications and consented to receiving care in this manner.   Patient is being evaluated for possible acute neurologic impairment and high probability of imminent or life-threatening deterioration. I spent total of 50 minutes providing care to this patient, including time for face to face visit via telemedicine, review of medical records, imaging studies and discussion of findings with providers, the patient and/or family.   Dr Parthenia Ames   [facility] For Inpatient  follow-up with [facility] physician please call [facility] [phone]. This is not an outpatient service. Post hospital discharge, please contact hospital directly.  Please do not communicate with [facility] physicians via secure chat. If you have any questions, Please contact [facility]. Please call or reconsult our service if there are any clinical or diagnostic changes.

## 2022-11-07 NOTE — Progress Notes (Signed)
Patient states the left side of his face is tingling. Then he states it is numb and states his left eye is still blurred. Patient states this sometimes happens when he has a severe headache ever since his stroke. But he also states this is how he knew he was having a previous stroke that his vision was blurry and the left side of his face was numb.   RN notified Manuela Schwartz, NP of the above information. RN performed NIH stroke scale. NIH 1 due to numbness.   RN told to call Code stroke by NP.   Code stroke initiated and patient was taken to CT with this RN and the Rapid RN.

## 2022-11-07 NOTE — Progress Notes (Addendum)
05:39: Tele stroke cart activated. Pt in Ct. MD at bedside, Manuela Schwartz, NP. LKW 0400. ROS: 0430. Pt c/o headache, blurred vision in left eye, tingling on left side of face.   0544: TSP paged at this time.   0559: TSP on camera at this time to examine patient.   0600: Results of NCCT provided to TSP.   4098: Time out performed for TNK administration.   1191: TNK 25mg  given at this time.   4782: TSP off camera at this time. TSP and Manuela Schwartz, NP discussed plan of care at bedside.   0700: TSRN off camera at this time. Discussed post tnk care with bedside team.   0708: Results of advanced imaging provided to TSP via secure message.

## 2022-11-07 NOTE — Assessment & Plan Note (Addendum)
TNK given in the morning of 5/23.  Patient had left leg weakness and left facial numbness and headache.  MRI negative for acute event and echocardiogram shows a normal ejection fraction.  Ordered.  Case discussed with neurology.  Aspirin and Plavix and increased dose of Crestor ordered today.

## 2022-11-07 NOTE — Progress Notes (Signed)
*  PRELIMINARY RESULTS* Echocardiogram 2D Echocardiogram has been performed.  Carolyne Fiscal 11/07/2022, 9:23 AM

## 2022-11-07 NOTE — Progress Notes (Signed)
Pt arrived to ICU at 0655. Post TNK protocol vitals and NIH began at 0700 upon arrival.

## 2022-11-08 ENCOUNTER — Inpatient Hospital Stay: Payer: 59

## 2022-11-08 DIAGNOSIS — M533 Sacrococcygeal disorders, not elsewhere classified: Secondary | ICD-10-CM | POA: Diagnosis not present

## 2022-11-08 DIAGNOSIS — I639 Cerebral infarction, unspecified: Secondary | ICD-10-CM | POA: Diagnosis not present

## 2022-11-08 DIAGNOSIS — R102 Pelvic and perineal pain: Secondary | ICD-10-CM | POA: Diagnosis not present

## 2022-11-08 DIAGNOSIS — I10 Essential (primary) hypertension: Secondary | ICD-10-CM | POA: Diagnosis not present

## 2022-11-08 LAB — HEPATIC FUNCTION PANEL
ALT: 76 U/L — ABNORMAL HIGH (ref 0–44)
AST: 31 U/L (ref 15–41)
Albumin: 3.4 g/dL — ABNORMAL LOW (ref 3.5–5.0)
Alkaline Phosphatase: 44 U/L (ref 38–126)
Bilirubin, Direct: 0.2 mg/dL (ref 0.0–0.2)
Indirect Bilirubin: 0.9 mg/dL (ref 0.3–0.9)
Total Bilirubin: 1.1 mg/dL (ref 0.3–1.2)
Total Protein: 5.8 g/dL — ABNORMAL LOW (ref 6.5–8.1)

## 2022-11-08 LAB — RENAL FUNCTION PANEL
Albumin: 3.5 g/dL (ref 3.5–5.0)
Anion gap: 7 (ref 5–15)
BUN: 23 mg/dL (ref 8–23)
CO2: 24 mmol/L (ref 22–32)
Calcium: 8.6 mg/dL — ABNORMAL LOW (ref 8.9–10.3)
Chloride: 108 mmol/L (ref 98–111)
Creatinine, Ser: 1.58 mg/dL — ABNORMAL HIGH (ref 0.61–1.24)
GFR, Estimated: 49 mL/min — ABNORMAL LOW (ref >60)
Glucose, Bld: 143 mg/dL — ABNORMAL HIGH (ref 70–99)
Phosphorus: 4.1 mg/dL (ref 2.5–4.6)
Potassium: 3.9 mmol/L (ref 3.5–5.1)
Sodium: 139 mmol/L (ref 135–145)

## 2022-11-08 LAB — CBC
HCT: 45 % (ref 39.0–52.0)
Hemoglobin: 14.6 g/dL (ref 13.0–17.0)
MCH: 30.9 pg (ref 26.0–34.0)
MCHC: 32.4 g/dL (ref 30.0–36.0)
MCV: 95.3 fL (ref 80.0–100.0)
Platelets: 141 10*3/uL — ABNORMAL LOW (ref 150–400)
RBC: 4.72 MIL/uL (ref 4.22–5.81)
RDW: 13.8 % (ref 11.5–15.5)
WBC: 9 10*3/uL (ref 4.0–10.5)
nRBC: 0 % (ref 0.0–0.2)

## 2022-11-08 LAB — LIPID PANEL
Cholesterol: 236 mg/dL — ABNORMAL HIGH (ref 0–200)
HDL: 43 mg/dL (ref >40)
LDL Cholesterol: 115 mg/dL — ABNORMAL HIGH (ref 0–99)
Total CHOL/HDL Ratio: 5.5 RATIO
Triglycerides: 388 mg/dL — ABNORMAL HIGH (ref <150)
VLDL: 78 mg/dL — ABNORMAL HIGH (ref 0–40)

## 2022-11-08 LAB — HEMOGLOBIN A1C
Hgb A1c MFr Bld: 7.1 % — ABNORMAL HIGH (ref 4.8–5.6)
Mean Plasma Glucose: 157 mg/dL

## 2022-11-08 LAB — MRSA CULTURE

## 2022-11-08 MED ORDER — AMLODIPINE BESYLATE 10 MG PO TABS
10.0000 mg | ORAL_TABLET | Freq: Every day | ORAL | Status: DC
  Administered 2022-11-09 – 2022-11-11 (×3): 10 mg via ORAL
  Filled 2022-11-08 (×3): qty 1

## 2022-11-08 MED ORDER — ROSUVASTATIN CALCIUM 20 MG PO TABS
20.0000 mg | ORAL_TABLET | Freq: Every day | ORAL | Status: DC
  Administered 2022-11-08 – 2022-11-11 (×4): 20 mg via ORAL
  Filled 2022-11-08 (×3): qty 1
  Filled 2022-11-08: qty 2

## 2022-11-08 MED ORDER — AMLODIPINE BESYLATE 5 MG PO TABS
5.0000 mg | ORAL_TABLET | Freq: Once | ORAL | Status: AC
  Administered 2022-11-08: 5 mg via ORAL
  Filled 2022-11-08: qty 1

## 2022-11-08 MED ORDER — ASPIRIN 81 MG PO TBEC
81.0000 mg | DELAYED_RELEASE_TABLET | Freq: Every day | ORAL | Status: DC
  Administered 2022-11-08 – 2022-11-11 (×4): 81 mg via ORAL
  Filled 2022-11-08 (×4): qty 1

## 2022-11-08 MED ORDER — CLOPIDOGREL BISULFATE 75 MG PO TABS
300.0000 mg | ORAL_TABLET | Freq: Once | ORAL | Status: AC
  Administered 2022-11-08: 300 mg via ORAL
  Filled 2022-11-08: qty 4

## 2022-11-08 MED ORDER — CEPHALEXIN 500 MG PO CAPS
500.0000 mg | ORAL_CAPSULE | Freq: Four times a day (QID) | ORAL | Status: DC
  Administered 2022-11-08 – 2022-11-09 (×3): 500 mg via ORAL
  Filled 2022-11-08 (×4): qty 1

## 2022-11-08 MED ORDER — CLOPIDOGREL BISULFATE 75 MG PO TABS
75.0000 mg | ORAL_TABLET | Freq: Every day | ORAL | Status: DC
  Administered 2022-11-09 – 2022-11-11 (×3): 75 mg via ORAL
  Filled 2022-11-08 (×3): qty 1

## 2022-11-08 NOTE — Progress Notes (Signed)
Progress Note   Patient: Devin White:[id number] DOB: 19-Aug-1955 DOA: 11/05/2022     1 DOS: the patient was seen and examined on 11/08/2022   Brief hospital course: 67 y.o. male with medical history significant of cirrhosis s/p liver transplant on chronic immunosuppressive therapy (tacrolimus and mycophenolate mofetil), frequent UTI on suppressive antibiotics, gout on chronic prednisone, HFpEF, MV thrombosis, hypertension who presents to the ED with abdominal pain.   [name] states that for the last 4 weeks, he has been experiencing gradually worsening suprapubic and bilateral lower quadrant abdominal pain that is significantly worsened with urinating.  In addition, he notes that he has been urinating more frequently.  He notes that over the last several days, his symptoms have worsened more quickly.  He notes that over the last 24 to 48 hours, he has been experiencing nausea with vomiting and difficulty tolerating p.o. intake.  In addition, he awoke today with significant malaise.  He endorses recurrent hot and cold chills/sweats that have been going on for the last 2 weeks.  He denies any palpitations or shortness of breath at rest.   [name] endorses dyspnea on exertion that seems to have gotten worse since his symptoms began but states that is otherwise chronic.  He endorses occasional chest pain that lasts for several minutes before self resolving.  He notes that episodes occur every 8 to 9 days.  5/22.  Reviewed CT scan of the abdomen and urine analysis.  Continue antibiotics for now.  Will give 1 dose of IV Solu-Medrol with bilateral sacroiliac pain. 5/23.  Patient had left lower extremity weakness and left facial numbness.  A code stroke was called.  CT angio and CT head negative.  Seen by teleneurology TNK given.  Patient's left leg weakness has improved. 5/24.  MRI of the brain negative for acute event.  Neurology started on aspirin and Plavix and increased dose of Crestor.   Transfer out of ICU.  Try to control blood pressure by increasing Norvasc to 10 mg daily.  Assessment and Plan: * Acute CVA (cerebrovascular accident) (HCC) TNK given in the morning of 5/23.  Patient had left leg weakness and left facial numbness and headache.  MRI negative for acute event and echocardiogram shows a normal ejection fraction.  Ordered.  Case discussed with neurology.  Aspirin and Plavix and increased dose of Crestor ordered today.  Hypertension Increase Norvasc to 10 mg daily and lisinopril. As needed iv labetalol.   Acute suprapubic pain Continue antibiotics for now but changed to oral Keflex.  Urine analysis does not look impressive.  Urine culture multiple species.  CT scan does not show any etiology of his pain.  Sepsis ruled out. Urinating better.  Pain of both sacroiliac joints Right greater than left.  Chronic prednisone  Stage 3b chronic kidney disease (HCC) Creatinine 1.81 on presentation down to 1.58 with IV fluids  Liver transplant status (HCC) History of cirrhosis s/p liver transplantation on tacrolimus and CellCept and prednisone.  Chronic diastolic heart failure (HCC) No signs of heart failure.  Elevated liver function tests Continue to monitor  Obesity (BMI 30-39.9) BMI 35.70 with current height and weight in computer.  BPH (benign prostatic hyperplasia) With hesitancy and symptoms.  Continue Flomax  Chronic tophaceous gout - Continue home prednisone        Subjective: After MRI patient did have some symptoms on his left face.  When I saw him he was feeling okay.  His blood pressure was up a little  bit this morning.  States his strength is good with his left leg.  Admitted with lower abdominal pain, code stroke was called during the hospital course.  Physical Exam: Vitals:   11/08/22 0746 11/08/22 0800 11/08/22 0930 11/08/22 1000  BP: (!) 171/127 (!) 162/99 (!) 171/107 (!) 149/88  Pulse: 72 69 89 80  Resp: (!) 22 16 17 14   Temp:       TempSrc:      SpO2: 94% 95% (!) 89% 93%  Weight:      Height:       Physical Exam HENT:     Head: Normocephalic.     Mouth/Throat:     Pharynx: No oropharyngeal exudate.  Eyes:     General: Lids are normal.     Conjunctiva/sclera: Conjunctivae normal.  Cardiovascular:     Rate and Rhythm: Normal rate and regular rhythm.     Heart sounds: Normal heart sounds, S1 normal and S2 normal.  Abdominal:     Palpations: Abdomen is soft.     Tenderness: There is no abdominal tenderness.  Musculoskeletal:     Right lower leg: No swelling.     Left lower leg: No swelling.  Skin:    General: Skin is warm.     Findings: No rash.  Neurological:     Mental Status: He is alert and oriented to person, place, and time.     Comments: Able to straight leg rise     Data Reviewed: MRI of the brain did not show any acute abnormality, creatinine 1.58, ALT 76, total bilirubin 1.1, LDL 115, platelet count 141  Family Communication: Updated patient's daughter on the phone  Disposition: Status is: Inpatient Remains inpatient appropriate because: Transfer out of ICU today.  Try to control blood pressure little bit better today.  Planned Discharge Destination: Home with Home Health    Time spent: 28 minutes Case discussed with neurology, nursing staff and pharmacist  Author: Alford Highland, MD 11/08/2022 12:32 PM  For on call review [email or web address].

## 2022-11-08 NOTE — Evaluation (Signed)
Physical Therapy Evaluation Patient Details Name: Devin White MRN: [id number] DOB: 1956-05-29 Today's Date: 11/08/2022  History of Present Illness  Pt is a 67 y.o. male presenting to hospital 11/05/22 with c/o back pain, abdominal pain, urinary frequency, dizziness, generalized weakness, and SOB.  Pt admitted with acute suprapubic pain and pain of B sacroiliac joints.  Transferred to ICU 5/23 secondary to stroke symptoms--L sided weakness--s/p TNK.   PMH includes h/o liver failure d/t NASH s/p transplant on immunosuppresive therapy, htn, GERD, gout, BPH, kidney stones, CKD, sleep apnea, CVA, recurrent UTI's, CHF, lumbar surgery.  Clinical Impression  Prior to hospital admission, pt was independent with ambulation; no recent falls; lives alone in 1 level home with 3-4 STE R railing.  Currently pt is modified independent with bed mobility; CGA with transfer using RW; and CGA to take x10 steps in place (UE support on RW).  Limited standing activity d/t pt c/o lightheadedness/dizziness.  Pt reporting very mild lightheadedness at rest laying in bed beginning/end of session; pt reporting lightheadedness/dizziness increased with activity.  Pt's BP 149/78 (MAP 99) resting in bed beginning of session; BP 149/102 (MAP 114) sitting edge of bed; and BP 149/90 (MAP 107) with HR 87 bpm resting in bed end of session.  Pt reporting 3-4/10 HA at rest beginning/end of session (HA increased to 5/10 with activity but improved with rest).  Nurse updated on pt's status/vitals/symptoms/pain during session.  Pt would currently benefit from skilled PT to address noted impairments and functional limitations (see below for any additional details).  Upon hospital discharge, pt would benefit from ongoing therapy.    Recommendations for follow up therapy are one component of a multi-disciplinary discharge planning process, led by the attending physician.  Recommendations may be updated based on patient status, additional functional  criteria and insurance authorization.        Assistance Recommended at Discharge PRN  Patient can return home with the following  A little help with walking and/or transfers;A little help with bathing/dressing/bathroom;Assistance with cooking/housework;Assist for transportation;Help with stairs or ramp for entrance    Equipment Recommendations Rolling walker (2 wheels)  Recommendations for Other Services       Functional Status Assessment Patient has had a recent decline in their functional status and demonstrates the ability to make significant improvements in function in a reasonable and predictable amount of time.     Precautions / Restrictions Precautions Precautions: Fall Restrictions Weight Bearing Restrictions: No      Mobility  Bed Mobility Overal bed mobility: Needs Assistance Bed Mobility: Supine to Sit, Sit to Supine     Supine to sit: HOB elevated, Modified independent (Device/Increase time) Sit to supine: Modified independent (Device/Increase time), HOB elevated   General bed mobility comments: mild increased effort to perform on own    Transfers Overall transfer level: Needs assistance Equipment used: Rolling walker (2 wheels) Transfers: Sit to/from Stand Sit to Stand: Min guard           General transfer comment: mild increased effort to stand up to RW; vc's for UE placement    Ambulation/Gait Ambulation/Gait assistance: Min guard Gait Distance (Feet):  (pt took x10 steps in place with B LE's) Assistive device: Rolling walker (2 wheels)   Gait velocity: decreased     General Gait Details: steady taking steps in place  Stairs            Wheelchair Mobility    Modified Rankin (Stroke Patients Only)       Balance  Overall balance assessment: Needs assistance Sitting-balance support: Feet supported, No upper extremity supported Sitting balance-Leahy Scale: Good Sitting balance - Comments: steady (in sitting) reaching inside BOS    Standing balance support: Bilateral upper extremity supported, During functional activity, Reliant on assistive device for balance Standing balance-Leahy Scale: Good Standing balance comment: steady taking steps with RW use                             Pertinent Vitals/Pain Pain Assessment Pain Assessment: 0-10 Pain Score: 3  Pain Location: Headache Pain Descriptors / Indicators: Headache Pain Intervention(s): Limited activity within patient's tolerance, Monitored during session, Premedicated before session, Repositioned    Home Living Family/patient expects to be discharged to:: Private residence Living Arrangements: Alone Available Help at Discharge: Family;Available PRN/intermittently (Family lives in [place]) Type of Home: Mobile home Home Access: Stairs to enter Entrance Stairs-Rails: Right Entrance Stairs-Number of Steps: 3-4   Home Layout: One level Home Equipment: Rollator (4 wheels)      Prior Function Prior Level of Function : Independent/Modified Independent;Driving             Mobility Comments: Pt reports no falls in past 2 years. ADLs Comments: Per OT eval "Independent with ADL/IADL, has recovered back to baseline from stroke 1 year ago"     Hand Dominance   Dominant Hand: Right    Extremity/Trunk Assessment   Upper Extremity Assessment Upper Extremity Assessment: Defer to OT evaluation    Lower Extremity Assessment Lower Extremity Assessment: Generalized weakness;LLE deficits/detail (intact B LE tone, proprioception, and heel to shin coordination) LLE Deficits / Details: hip flexion, knee flexion/extension, and DF 4/5; decreased sensation lateral L thigh, medial/lateral L lower leg, medial L foot, and plantar surface L foot (pt reports some decreased sensation from prior stroke and also thinks he might have undiagnosed peripheral neuropathy) LLE Sensation: decreased light touch    Cervical / Trunk Assessment Cervical / Trunk  Assessment: Normal  Communication   Communication: No difficulties  Cognition Arousal/Alertness: Awake/alert Behavior During Therapy: WFL for tasks assessed/performed Overall Cognitive Status: Within Functional Limits for tasks assessed                                 General Comments: Pt pleasant and participatory; A&Ox4        General Comments Nursing cleared pt for participation in physical therapy.  Pt agreeable to PT session.   Exercises     Assessment/Plan    PT Assessment Patient needs continued PT services  PT Problem List Decreased strength;Decreased activity tolerance;Decreased balance;Decreased mobility;Decreased knowledge of use of DME;Decreased knowledge of precautions       PT Treatment Interventions DME instruction;Gait training;Stair training;Functional mobility training;Therapeutic activities;Therapeutic exercise;Balance training;Patient/family education    PT Goals (Current goals can be found in the Care Plan section)  Acute Rehab PT Goals Patient Stated Goal: to improve mobility PT Goal Formulation: With patient Time For Goal Achievement: 11/22/22 Potential to Achieve Goals: Good    Frequency Min 4X/week     Co-evaluation               AM-PAC PT "6 Clicks" Mobility  Outcome Measure Help needed turning from your back to your side while in a flat bed without using bedrails?: None Help needed moving from lying on your back to sitting on the side of a flat bed without using bedrails?: None Help  needed moving to and from a bed to a chair (including a wheelchair)?: A Little Help needed standing up from a chair using your arms (e.g., wheelchair or bedside chair)?: A Little Help needed to walk in hospital room?: A Little Help needed climbing 3-5 steps with a railing? : A Little 6 Click Score: 20    End of Session Equipment Utilized During Treatment: Gait belt Activity Tolerance: Other (comment) (limited d/t c/o  lightheadedness) Patient left: in bed;with call bell/phone within reach;with bed alarm set;Other (comment) (B heels floating via pillow support) Nurse Communication: Mobility status;Precautions;Other (comment) (pt's symptoms and BP during session) PT Visit Diagnosis: Other abnormalities of gait and mobility (R26.89);Other symptoms and signs involving the nervous system (R29.898)    Time: 1610-9604 PT Time Calculation (min) (ACUTE ONLY): 25 min   Charges:   PT Evaluation $PT Eval Low Complexity: 1 Low PT Treatments $Therapeutic Activity: 8-22 mins       Hendricks Limes, PT 11/08/22, 11:26 AM

## 2022-11-08 NOTE — Evaluation (Signed)
Occupational Therapy Evaluation Patient Details Name: Devin White MRN: [id number] DOB: [birth year] Today's Date: 11/08/2022   History of Present Illness Devin White is a 67 y.o. male with a history of previous stroke who is currently hospitalized with complicated UTI who was normal at 4 AM and then fell asleep for a few minutes then awoke later with left-sided weakness.  He states that he is unable to lift his left leg very well at all.  A code stroke was activated and he was evaluated by teleneurology who deemed him a candidate for IV tenecteplase and this was administered with rapid improvement in his symptoms. PMH includes: significant of cirrhosis s/p liver transplant on chronic immunosuppressive therapy (tacrolimus and mycophenolate mofetil), frequent UTI on suppressive antibiotics, gout on chronic prednisone, HFpEF, MV thrombosis, & hypertension.   Clinical Impression   Devin White was seen for OT evaluation this date. Pt cleared for participation in therapy session by primary RN/MD. Pt reports he was independent with ADL/IADL management prior to admission and that he had recently recovered from a stroke ~ 1 year ago and was back to living alone in his mobile home with 3 STE. Pt presents to acute OT demonstrating impaired ADL performance and functional mobility 2/2 generalized weakness, decreased activity tolerance, and cardiopulmonary status (See OT problem list). Pt currently requires SUPERVISION for bed mobility. Anticipate MIN A for LB ADL management with frequent therapeutic rest breaks. Functional activity limited during evaluation 2/2 elevated BP with pt reporting symptoms of headache and feeling "swimmy headed" RN notified/aware see vitals below. Pt would benefit from skilled OT services to address noted impairments and functional limitations (see below for any additional details) in order to maximize safety and independence while minimizing falls risk and caregiver burden.      11/08/22 0905  Therapy Vitals  Patient Position (if appropriate) Orthostatic Vitals  Orthostatic Lying   BP- Lying (!) 164/93 (MAP 113)  Pulse- Lying 72  Orthostatic Sitting  BP- Sitting (!) 185/98 (MAP 123)  Pulse- Sitting 77 (Sitting at 1 min 156/102 MAP 120)       Recommendations for follow up therapy are one component of a multi-disciplinary discharge planning process, led by the attending physician.  Recommendations may be updated based on patient status, additional functional criteria and insurance authorization.   Assistance Recommended at Discharge Intermittent Supervision/Assistance  Patient can return home with the following A little help with walking and/or transfers;A little help with bathing/dressing/bathroom;Assist for transportation;Help with stairs or ramp for entrance    Functional Status Assessment  Patient has had a recent decline in their functional status and demonstrates the ability to make significant improvements in function in a reasonable and predictable amount of time.  Equipment Recommendations  BSC/3in1    Recommendations for Other Services       Precautions / Restrictions Precautions Precautions: Fall Restrictions Weight Bearing Restrictions: No      Mobility Bed Mobility Overal bed mobility: Needs Assistance Bed Mobility: Supine to Sit     Supine to sit: HOB elevated, Modified independent (Device/Increase time)          Transfers                   General transfer comment: deferred.      Balance Overall balance assessment: Needs assistance Sitting-balance support: Feet supported, No upper extremity supported Sitting balance-Leahy Scale: Good Sitting balance - Comments: steady sitting, reaching inside BOS       Standing balance comment: NT  ADL either performed or assessed with clinical judgement   ADL Overall ADL's : Needs assistance/impaired                                        General ADL Comments: Pt limited by cardiopulmonary status, generalized weakness, and decreased activity tolerance. Requires SUPERVISION for safety with bed mobility. Further functional activity deferred 2/2 BP and pt pain level.     Vision Baseline Vision/History: 4 Cataracts (awaiting sx) Ability to See in Adequate Light: 1 Impaired Patient Visual Report: No change from baseline Additional Comments: Blurry vision in left eye at onset of symptoms but this has resolved since TNK     Perception     Praxis      Pertinent Vitals/Pain Pain Assessment Pain Assessment: 0-10 Pain Score: 5  Pain Location: Headache     Hand Dominance Right   Extremity/Trunk Assessment Upper Extremity Assessment Upper Extremity Assessment: Generalized weakness (Pt reports L sided weakness has resolved. BLE grossly 3/5 t/o no focal weakness appreciated.)   Lower Extremity Assessment Lower Extremity Assessment: Generalized weakness;Defer to PT evaluation   Cervical / Trunk Assessment Cervical / Trunk Assessment: Normal   Communication Communication Communication: No difficulties   Cognition Arousal/Alertness: Awake/alert Behavior During Therapy: WFL for tasks assessed/performed Overall Cognitive Status: Within Functional Limits for tasks assessed                                 General Comments: Pleasant, conversational, A&O x4, minimally anxious about BP and concerns for recurrent stroke.     General Comments  RN notified of pt vitals t/o session.    Exercises Other Exercises Other Exercises: Pt educated on role of OT in acute setting, Falls prevention strateiges for home/hospital, and routines modifications to support safety and functional independence during ADL management.   Shoulder Instructions      Home Living Family/patient expects to be discharged to:: Private residence Living Arrangements: Alone Available Help at Discharge: Family;Available  PRN/intermittently (Family lives in [place]) Type of Home: Mobile home Home Access: Stairs to enter Entrance Stairs-Number of Steps: 4 short steps Entrance Stairs-Rails: Right Home Layout: One level     Bathroom Shower/Tub: Chief Strategy Officer: Standard     Home Equipment: Agricultural consultant (2 wheels)          Prior Functioning/Environment Prior Level of Function : Independent/Modified Independent;Driving             Mobility Comments: Independent with home and community mobility, denies ffalls in last six months ADLs Comments: Independent with ADL/IADL, has recovered back to baseline from stroke 1 year ago        OT Problem List: Decreased strength;Pain;Cardiopulmonary status limiting activity;Decreased activity tolerance;Impaired balance (sitting and/or standing);Decreased knowledge of use of DME or AE      OT Treatment/Interventions: Self-care/ADL training;Therapeutic exercise;Therapeutic activities;DME and/or AE instruction;Patient/family education;Balance training;Energy conservation    OT Goals(Current goals can be found in the care plan section) Acute Rehab OT Goals Patient Stated Goal: To feel better OT Goal Formulation: With patient Time For Goal Achievement: 11/22/22 Potential to Achieve Goals: Good ADL Goals Pt Will Perform Grooming: sitting;standing;with modified independence Pt Will Perform Lower Body Dressing: sit to/from stand;with modified independence;with adaptive equipment Pt Will Transfer to Toilet: ambulating;with modified independence;regular height toilet Pt Will Perform Toileting - Clothing  Manipulation and hygiene: sit to/from stand;with modified independence;with adaptive equipment  OT Frequency: Min 2X/week    Co-evaluation              AM-PAC OT "6 Clicks" Daily Activity     Outcome Measure Help from another person eating meals?: None Help from another person taking care of personal grooming?: A Little Help from  another person toileting, which includes using toliet, bedpan, or urinal?: A Little Help from another person bathing (including washing, rinsing, drying)?: A Little Help from another person to put on and taking off regular upper body clothing?: A Little Help from another person to put on and taking off regular lower body clothing?: A Little 6 Click Score: 19   End of Session Nurse Communication: Mobility status;Other (comment) (Vitals/pain)  Activity Tolerance: Treatment limited secondary to medical complications (Comment) (BP, RN notified) Patient left: in bed;with call bell/phone within reach;with bed alarm set  OT Visit Diagnosis: Other abnormalities of gait and mobility (R26.89);Muscle weakness (generalized) (M62.81)                Time: 1610-9604 OT Time Calculation (min): 39 min Charges:  OT General Charges $OT Visit: 1 Visit OT Evaluation $OT Eval Moderate Complexity: 1 Mod OT Treatments $Self Care/Home Management : 23-37 mins  Rockney Ghee, M.S., OTR/L 11/08/22, 11:37 AM

## 2022-11-09 DIAGNOSIS — N410 Acute prostatitis: Secondary | ICD-10-CM | POA: Diagnosis not present

## 2022-11-09 DIAGNOSIS — I10 Essential (primary) hypertension: Secondary | ICD-10-CM | POA: Diagnosis not present

## 2022-11-09 DIAGNOSIS — R350 Frequency of micturition: Secondary | ICD-10-CM

## 2022-11-09 DIAGNOSIS — R102 Pelvic and perineal pain: Secondary | ICD-10-CM | POA: Diagnosis not present

## 2022-11-09 DIAGNOSIS — I639 Cerebral infarction, unspecified: Secondary | ICD-10-CM | POA: Diagnosis not present

## 2022-11-09 LAB — RENAL FUNCTION PANEL
Albumin: 3.5 g/dL (ref 3.5–5.0)
Anion gap: 8 (ref 5–15)
BUN: 23 mg/dL (ref 8–23)
CO2: 25 mmol/L (ref 22–32)
Calcium: 8.9 mg/dL (ref 8.9–10.3)
Chloride: 108 mmol/L (ref 98–111)
Creatinine, Ser: 1.34 mg/dL — ABNORMAL HIGH (ref 0.61–1.24)
GFR, Estimated: 59 mL/min — ABNORMAL LOW (ref >60)
Glucose, Bld: 129 mg/dL — ABNORMAL HIGH (ref 70–99)
Phosphorus: 3.8 mg/dL (ref 2.5–4.6)
Potassium: 3.8 mmol/L (ref 3.5–5.1)
Sodium: 141 mmol/L (ref 135–145)

## 2022-11-09 LAB — CULTURE, BLOOD (ROUTINE X 2): Special Requests: ADEQUATE

## 2022-11-09 LAB — CBC
HCT: 45.5 % (ref 39.0–52.0)
Hemoglobin: 15.4 g/dL (ref 13.0–17.0)
MCH: 31.7 pg (ref 26.0–34.0)
MCHC: 33.8 g/dL (ref 30.0–36.0)
MCV: 93.6 fL (ref 80.0–100.0)
Platelets: 148 10*3/uL — ABNORMAL LOW (ref 150–400)
RBC: 4.86 MIL/uL (ref 4.22–5.81)
RDW: 13.8 % (ref 11.5–15.5)
WBC: 9.4 10*3/uL (ref 4.0–10.5)
nRBC: 0 % (ref 0.0–0.2)

## 2022-11-09 LAB — MRSA CULTURE

## 2022-11-09 MED ORDER — ACETAMINOPHEN 325 MG PO TABS
650.0000 mg | ORAL_TABLET | Freq: Four times a day (QID) | ORAL | Status: DC | PRN
  Administered 2022-11-09 – 2022-11-11 (×2): 650 mg via ORAL
  Filled 2022-11-09 (×2): qty 2

## 2022-11-09 MED ORDER — LEVOFLOXACIN 500 MG PO TABS
500.0000 mg | ORAL_TABLET | Freq: Every day | ORAL | Status: DC
  Administered 2022-11-09 – 2022-11-11 (×3): 500 mg via ORAL
  Filled 2022-11-09 (×3): qty 1

## 2022-11-09 MED ORDER — LISINOPRIL 20 MG PO TABS
20.0000 mg | ORAL_TABLET | Freq: Every day | ORAL | Status: DC
  Administered 2022-11-09 – 2022-11-11 (×3): 20 mg via ORAL
  Filled 2022-11-09 (×3): qty 1

## 2022-11-09 MED ORDER — ACETAMINOPHEN 650 MG RE SUPP: 650.0000 mg | Freq: Four times a day (QID) | RECTAL | Status: DC | PRN

## 2022-11-09 MED ORDER — LABETALOL HCL 5 MG/ML IV SOLN
5.0000 mg | Freq: Once | INTRAVENOUS | Status: AC
  Administered 2022-11-09: 5 mg via INTRAVENOUS
  Filled 2022-11-09: qty 4

## 2022-11-09 NOTE — Assessment & Plan Note (Addendum)
Likely partially treated with Rocephin but worsened after switching over to Keflex.  Switch antibiotics over to Levaquin.  Prostate exam on 5/25.  Likely the cause of the patient's sweating episodes.

## 2022-11-09 NOTE — Progress Notes (Signed)
Progress Note   Patient: Devin White:[id number] DOB: 1955/10/14 DOA: 11/05/2022     2 DOS: the patient was seen and examined on 11/09/2022   Brief hospital course: 67 y.o. male with medical history significant of cirrhosis s/p liver transplant on chronic immunosuppressive therapy (tacrolimus and mycophenolate mofetil), frequent UTI on suppressive antibiotics, gout on chronic prednisone, HFpEF, MV thrombosis, hypertension who presents to the ED with abdominal pain.   [name] states that for the last 4 weeks, he has been experiencing gradually worsening suprapubic and bilateral lower quadrant abdominal pain that is significantly worsened with urinating.  In addition, he notes that he has been urinating more frequently.  He notes that over the last several days, his symptoms have worsened more quickly.  He notes that over the last 24 to 48 hours, he has been experiencing nausea with vomiting and difficulty tolerating p.o. intake.  In addition, he awoke today with significant malaise.  He endorses recurrent hot and cold chills/sweats that have been going on for the last 2 weeks.  He denies any palpitations or shortness of breath at rest.   [name] endorses dyspnea on exertion that seems to have gotten worse since his symptoms began but states that is otherwise chronic.  He endorses occasional chest pain that lasts for several minutes before self resolving.  He notes that episodes occur every 8 to 9 days.  5/22.  Reviewed CT scan of the abdomen and urine analysis.  Continue antibiotics for now.  Will give 1 dose of IV Solu-Medrol with bilateral sacroiliac pain. 5/23.  Patient had left lower extremity weakness and left facial numbness.  A code stroke was called.  CT angio and CT head negative.  Seen by teleneurology TNK given.  Patient's left leg weakness has improved. 5/24.  MRI of the brain negative for acute event.  Neurology started on aspirin and Plavix and increased dose of Crestor.   Transfer out of ICU.  Try to control blood pressure by increasing Norvasc to 10 mg daily. 525.  Had sweating episodes again last night.  Was having them before coming into the hospital.  Prostate tender on exam.  Switch antibiotics over to Levaquin.  With blood pressure being high this morning I increased the lisinopril also.  Assessment and Plan: * Acute bacterial prostatitis Likely partially treated with Rocephin but worsened after switching over to Keflex.  Switch antibiotics over to Levaquin.  Prostate exam on 5/25.  Likely the cause of the patient's sweating episodes.  Acute CVA (cerebrovascular accident) (HCC) TNK given in the morning of 5/23.  Patient had left leg weakness and left facial numbness and headache.  MRI negative for acute event and echocardiogram shows a normal ejection fraction.  Case discussed with neurology.  Aspirin and Plavix (for 21 days) and increased dose of Crestor.  PT reevaluation today.  Hypertension Continue increased dose of Norvasc to 10 mg daily and increase dose of lisinopril to 20 mg. As needed iv labetalol.   Acute suprapubic pain Wondering if prostatitis was the cause of this all long.  Urine analysis does not look impressive.  Urine culture multiple species.  CT scan does not show any etiology of his pain.  Sepsis ruled out. Urinating better.  Pain of both sacroiliac joints Right greater than left.  Chronic prednisone  Stage 3b chronic kidney disease (HCC) Creatinine 1.81 on presentation down to 1.34.   Liver transplant status (HCC) History of cirrhosis s/p liver transplantation on tacrolimus and CellCept and prednisone.  Chronic diastolic heart failure (HCC) No signs of heart failure.  Elevated liver function tests Continue to monitor  Obesity (BMI 30-39.9) BMI 35.70 with current height and weight in computer.  BPH (benign prostatic hyperplasia) With hesitancy and symptoms.  Continue Flomax  Chronic tophaceous gout - Continue home  prednisone        Subjective: Patient had chills and sweating last night and did not sleep very well.  Blood pressure up a little bit overnight and early morning.  Left leg strength is okay.  Still overall feels weak.  Today with prostate exam prostate was tender.  Physical Exam: Vitals:   11/09/22 0529 11/09/22 0643 11/09/22 0722 11/09/22 1015  BP: (!) 148/113 (!) 153/98 (!) 156/100 132/79  Pulse:   78 84  Resp:   16 18  Temp:   97.8 F (36.6 C)   TempSrc:      SpO2:   91% 96%  Weight:      Height:       Physical Exam HENT:     Head: Normocephalic.     Mouth/Throat:     Pharynx: No oropharyngeal exudate.  Eyes:     General: Lids are normal.     Conjunctiva/sclera: Conjunctivae normal.  Cardiovascular:     Rate and Rhythm: Normal rate and regular rhythm.     Heart sounds: Normal heart sounds, S1 normal and S2 normal.  Abdominal:     Palpations: Abdomen is soft.     Tenderness: There is no abdominal tenderness.  Genitourinary:    Prostate: Tender.  Musculoskeletal:     Right lower leg: No swelling.     Left lower leg: No swelling.  Skin:    General: Skin is warm.     Findings: No rash.  Neurological:     Mental Status: He is alert and oriented to person, place, and time.     Data Reviewed: Creatinine 1.34, LDL 115, platelet count 148  Family Communication: Left message for patient's daughter  Disposition: Status is: Inpatient Remains inpatient appropriate because: Changing antibiotics to medication that can cover prostate.  Antibiotic changed to Levaquin today.  Planned Discharge Destination: Home with Home Health    Time spent: 28 minutes  Author: Alford Highland, MD 11/09/2022 11:24 AM  For on call review [email or web address].

## 2022-11-09 NOTE — Plan of Care (Signed)

## 2022-11-09 NOTE — Progress Notes (Signed)
Occupational Therapy Treatment Patient Details Name: Devin White MRN: [id number] DOB: 1955/10/01 Today's Date: 11/09/2022   History of present illness Pt is a 67 y.o. male presenting to hospital 11/05/22 with c/o back pain, abdominal pain, urinary frequency, dizziness, generalized weakness, and SOB.  Pt admitted with acute suprapubic pain and pain of B sacroiliac joints.  Transferred to ICU 5/23 secondary to stroke symptoms--L sided weakness--s/p TNK.   PMH includes h/o liver failure d/t NASH s/p transplant on immunosuppresive therapy, htn, GERD, gout, BPH, kidney stones, CKD, sleep apnea, CVA, recurrent UTI's, CHF, lumbar surgery.   OT comments  Patient received supine in bed and agreeable to OT. Tx session targeted increasing activity tolerance for improved ADL completion. Pt completed bed mobility with Mod I this date. He then stood from EOB and took several steps toward bedside sink with CGA while using a RW. Pt tolerated sinkside grooming tasks for ~3 min before requiring a seated rest break. Pt requested to return to supine and was left with all needs in reach. Pt is making progress toward goal completion. D/C recommendation remains appropriate. OT will continue to follow acutely.   Orthostatic vitals: BP-supine: 140/89 (105), HR 82 BP-sitting: 144/102 (114) HR 93 BP-standing: 135/85 (100) HR 99    Recommendations for follow up therapy are one component of a multi-disciplinary discharge planning process, led by the attending physician.  Recommendations may be updated based on patient status, additional functional criteria and insurance authorization.    Assistance Recommended at Discharge Intermittent Supervision/Assistance  Patient can return home with the following  A little help with walking and/or transfers;A little help with bathing/dressing/bathroom;Assist for transportation;Help with stairs or ramp for entrance   Equipment Recommendations  BSC/3in1    Recommendations for  Other Services      Precautions / Restrictions Precautions Precautions: Fall Restrictions Weight Bearing Restrictions: No       Mobility Bed Mobility Overal bed mobility: Needs Assistance Bed Mobility: Supine to Sit, Sit to Supine     Supine to sit: HOB elevated, Modified independent (Device/Increase time) Sit to supine: Modified independent (Device/Increase time)        Transfers Overall transfer level: Needs assistance Equipment used: Rolling walker (2 wheels) Transfers: Sit to/from Stand Sit to Stand: Min guard (from EOB)                 Balance Overall balance assessment: Needs assistance Sitting-balance support: Feet supported, No upper extremity supported Sitting balance-Leahy Scale: Good     Standing balance support: Bilateral upper extremity supported, During functional activity, Reliant on assistive device for balance Standing balance-Leahy Scale: Good       ADL either performed or assessed with clinical judgement   ADL Overall ADL's : Needs assistance/impaired     Grooming: Wash/dry face;Set up;Supervision/safety;Standing;Oral care Grooming Details (indicate cue type and reason): tolerated standing at sink for ~3 min before requiring seated rest break (pt endorsed feeling "wobbly" on his feet)                 Toilet Transfer: Rolling walker (2 wheels);Min guard Toilet Transfer Details (indicate cue type and reason): simulated with STS from EOB         Functional mobility during ADLs: Min guard;Rolling walker (2 wheels) (to take a several steps (~46ft) toward bedside sink)      Extremity/Trunk Assessment Upper Extremity Assessment Upper Extremity Assessment: Generalized weakness   Lower Extremity Assessment Lower Extremity Assessment: Generalized weakness   Cervical / Trunk Assessment Cervical /  Trunk Assessment: Normal    Vision Baseline Vision/History: 4 Cataracts (awaiting sx) Ability to See in Adequate Light: 1  Impaired Patient Visual Report: No change from baseline     Perception     Praxis      Cognition Arousal/Alertness: Awake/alert Behavior During Therapy: WFL for tasks assessed/performed Overall Cognitive Status: Within Functional Limits for tasks assessed            Exercises      Shoulder Instructions       General Comments      Pertinent Vitals/ Pain       Pain Assessment Pain Assessment: Faces Faces Pain Scale: Hurts a little bit Pain Location: Headache Pain Descriptors / Indicators: Headache Pain Intervention(s): Monitored during session  Home Living        Prior Functioning/Environment              Frequency  Min 3X/week        Progress Toward Goals  OT Goals(current goals can now be found in the care plan section)  Progress towards OT goals: Progressing toward goals  Acute Rehab OT Goals Patient Stated Goal: to feel better OT Goal Formulation: With patient Time For Goal Achievement: 11/22/22 Potential to Achieve Goals: Good  Plan Discharge plan remains appropriate;Frequency remains appropriate    Co-evaluation                 AM-PAC OT "6 Clicks" Daily Activity     Outcome Measure   Help from another person eating meals?: None Help from another person taking care of personal grooming?: A Little Help from another person toileting, which includes using toliet, bedpan, or urinal?: A Little Help from another person bathing (including washing, rinsing, drying)?: A Little Help from another person to put on and taking off regular upper body clothing?: A Little Help from another person to put on and taking off regular lower body clothing?: A Little 6 Click Score: 19    End of Session Equipment Utilized During Treatment: Gait belt;Rolling walker (2 wheels)  OT Visit Diagnosis: Other abnormalities of gait and mobility (R26.89);Muscle weakness (generalized) (M62.81)   Activity Tolerance Patient tolerated treatment well;Patient  limited by fatigue   Patient Left in bed;with call bell/phone within reach;with bed alarm set   Nurse Communication Mobility status;Other (comment) (vitals)        Time: 2202-5427 OT Time Calculation (min): 20 min  Charges: OT General Charges $OT Visit: 1 Visit OT Treatments $Self Care/Home Management : 8-22 mins  [facility] MS, OTR/L ascom [phone]  11/09/22, 6:13 PM

## 2022-11-09 NOTE — Progress Notes (Signed)
Subjective: No return of symptoms  Exam: Vitals:   11/09/22 0643 11/09/22 0722  BP: (!) 153/98 (!) 156/100  Pulse:  78  Resp:  16  Temp:  97.8 F (36.6 C)  SpO2:  91%   Gen: In bed, NAD Resp: non-labored breathing, no acute distress Abd: soft, nt  Neuro: MS: Awake, alert, interactive and appropriate CN: Mild left facial weakness (suspect baseline) Motor: No drift Sensory: Intact to light touch  Pertinent Labs: Creatinine 1.34 LDL 115  Echo with no embolic source CTA with some intracranial athero-, but no LVO MRI brain is negative  Impression: 67 year old male with a history of previous stroke who had transient weakness that resolved with tenecteplase.  My suspicion is this represents stroke aborted with thrombolytic therapy.  Recommendations: 1) Crestor increased from 5-20 2) aspirin 81 mg and Plavix 75 mg daily for 3 weeks followed by aspirin monotherapy 3) neurology will be available on an as-needed basis.  Ritta Slot, MD [facility] [phone]  If 7pm- 7am, please page neurology on call as listed in AMION.

## 2022-11-09 NOTE — TOC Initial Note (Signed)
Transition of Care [facility]) - Initial/Assessment Note    Patient Details  Name: Devin White MRN: [id number] Date of Birth: 18-May-1956  [facility] CM/SW Contact:    Bing Quarry, RN Phone Number: 11/09/2022, 1:42 PM  Clinical Narrative:  5/25: Patient was admitted on 11/05/22 for lower abdominal pain. Significant PMH for liver transplant 15 years ago. Htx prior CVA. On 11/07/22 a Code Stroke called on the way to MRI. PTA lived alone, independent with a RW with seat, no recent falls, has had HH before but unable to recall agency. Was living with sister at that time. Followed by [facility] for liver transplant issues and to have several tests/procedures whem medically stable for discharge. PCP Dr. Tillman Abide. Discussed CM role and PT recommendations for [facility] on discharge. Patient declines at this juncture due to upcoming procedures/testing at [facility]. Discussed contacting PCP or treating providers at [facility] if changes his mind, but he states he feels safe going home. PT will re-evaluate today and patient encouraged to reach out to CM vai Unit RN if he has a change in progress or changes his mind, or other needs arise. TOC to follow through discharge in case of changes in progression or disposition. Gabriel Cirri RN CM                     Patient Goals and CMS Choice            Expected Discharge Plan and Services                                              Prior Living Arrangements/Services                       Activities of Daily Living Home Assistive Devices/Equipment: Eyeglasses ADL Screening (condition at time of admission) Patient's cognitive ability adequate to safely complete daily activities?: Yes Is the patient deaf or have difficulty hearing?: No Does the patient have difficulty seeing, even when wearing glasses/contacts?: No Does the patient have difficulty concentrating, remembering, or making decisions?: No Patient able to express need for  assistance with ADLs?: Yes Does the patient have difficulty dressing or bathing?: No Independently performs ADLs?: Yes (appropriate for developmental age) Does the patient have difficulty walking or climbing stairs?: No Weakness of Legs: None Weakness of Arms/Hands: None  Permission Sought/Granted                  Emotional Assessment              Admission diagnosis:  Complicated UTI (urinary tract infection) [N39.0] Urinary tract infection without hematuria, site unspecified [N39.0] Sepsis, due to unspecified organism, unspecified whether acute organ dysfunction present [facility]) [A41.9] Acute CVA (cerebrovascular accident) [facility]) [I63.9] Patient Active Problem List   Diagnosis Date Noted   Acute bacterial prostatitis 11/09/2022   Acute CVA (cerebrovascular accident) (HCC) 11/07/2022   Acute suprapubic pain 11/06/2022   Pain of both sacroiliac joints 11/06/2022   Elevated liver function tests 11/06/2022   Acute pyelonephritis 01/31/2022   Sepsis (HCC) 01/31/2022   Hypokalemia 12/27/2021   Severe sepsis (HCC) 12/26/2021   Acute UTI (urinary tract infection) 12/26/2021   Left sided numbness 11/14/2021   Obesity (BMI 30-39.9) 10/27/2021   Diarrhea 10/26/2021   Stage 3a chronic kidney disease (CKD) (HCC) 10/26/2021   Hyperbilirubinemia  10/26/2021   Hyperglycemia 10/26/2021   Chronic diastolic heart failure (HCC) 09/05/2021   Facial swelling 04/26/2021   Gastric intestinal metaplasia 01/01/2021   Sick euthyroidism 10/03/2020   Achilles tendon mass 09/25/2020   Joint pain 09/22/2020   Recurrent UTI 03/26/2019   Pulmonary nodule 08/04/2018   Liver transplant status (HCC) 10/10/2017   Advance directive discussed with patient 10/10/2017   Stage 3b chronic kidney disease (HCC) 10/09/2016   Mallory-Weiss tear 04/16/2016   Long-term use of immunosuppressant medication 04/08/2016   Sleep apnea 10/06/2015   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 [place] [address] [place] [address] Phone: [phone] Fax: [phone]     Social Determinants of Health (SDOH) Social History: SDOH Screenings   Food Insecurity: No Food Insecurity (11/06/2022)  Housing: Low Risk  (11/06/2022)  Transportation Needs: No Transportation Needs (11/06/2022)  Utilities: Not At Risk (11/06/2022)  Depression (PHQ2-9): Low Risk  (07/16/2022)  Financial Resource Strain: Low Risk  (07/16/2022)  Physical Activity: Insufficiently Active (07/16/2022)  Social Connections: Socially Isolated (07/16/2022)  Stress: No Stress Concern Present (07/16/2022)  Tobacco Use: Low Risk  (11/06/2022)   SDOH Interventions:     Readmission Risk Interventions    11/09/2022    1:38 PM 01/31/2022    9:58 AM 11/06/2021   11:09 AM  Readmission Risk Prevention Plan  Transportation Screening Complete Complete Complete  PCP or Specialist Appt within 5-7 Days Complete  Complete  PCP or Specialist Appt within 3-5 Days  Complete   Home Care Screening Complete  Complete  Medication Review (RN CM) Referral to Pharmacy  Referral to Pharmacy  Social Work Consult for Recovery Care Planning/Counseling  Complete   Palliative Care Screening  Not Applicable   Medication Review [employer])  Complete

## 2022-11-09 NOTE — Progress Notes (Signed)
Mobility Specialist - Progress Note   11/09/22 1300  Orthostatic Sitting  BP- Sitting 128/77  Orthostatic Standing at 0 minutes  BP- Standing at 0 minutes 127/76  Orthostatic Standing at 3 minutes  BP- Standing at 3 minutes 123/82  Mobility  Activity Stood at bedside;Dangled on edge of bed;Transferred from chair to bed  Level of Assistance Standby assist, set-up cues, supervision of patient - no hands on  Assistive Device Front wheel walker  Distance Ambulated (ft) 3 ft  Activity Response Tolerated well  Mobility Referral Yes  $Mobility charge 1 Mobility  Mobility Specialist Start Time (ACUTE ONLY) 1252  Mobility Specialist Stop Time (ACUTE ONLY) 1315  Mobility Specialist Time Calculation (min) (ACUTE ONLY) 23 min   Pt sitting in recliner on RA upon arrival. Pt STS 3 times for orthostatic and transfers from recliner to bed SBA. Pt left in bed with needs in reach and bed alarm activated.   Terrilyn Saver  Mobility Specialist  11/09/22 1:20 PM

## 2022-11-10 ENCOUNTER — Inpatient Hospital Stay: Payer: 59

## 2022-11-10 DIAGNOSIS — I639 Cerebral infarction, unspecified: Secondary | ICD-10-CM | POA: Diagnosis not present

## 2022-11-10 DIAGNOSIS — I10 Essential (primary) hypertension: Secondary | ICD-10-CM | POA: Diagnosis not present

## 2022-11-10 DIAGNOSIS — N410 Acute prostatitis: Secondary | ICD-10-CM | POA: Diagnosis not present

## 2022-11-10 DIAGNOSIS — M533 Sacrococcygeal disorders, not elsewhere classified: Secondary | ICD-10-CM | POA: Diagnosis not present

## 2022-11-10 DIAGNOSIS — R200 Anesthesia of skin: Secondary | ICD-10-CM

## 2022-11-10 LAB — COMPREHENSIVE METABOLIC PANEL
ALT: 72 U/L — ABNORMAL HIGH (ref 0–44)
AST: 30 U/L (ref 15–41)
Albumin: 3.6 g/dL (ref 3.5–5.0)
Alkaline Phosphatase: 45 U/L (ref 38–126)
Anion gap: 9 (ref 5–15)
BUN: 30 mg/dL — ABNORMAL HIGH (ref 8–23)
CO2: 24 mmol/L (ref 22–32)
Calcium: 8.6 mg/dL — ABNORMAL LOW (ref 8.9–10.3)
Chloride: 105 mmol/L (ref 98–111)
Creatinine, Ser: 1.53 mg/dL — ABNORMAL HIGH (ref 0.61–1.24)
GFR, Estimated: 50 mL/min — ABNORMAL LOW (ref >60)
Glucose, Bld: 138 mg/dL — ABNORMAL HIGH (ref 70–99)
Potassium: 3.7 mmol/L (ref 3.5–5.1)
Sodium: 138 mmol/L (ref 135–145)
Total Bilirubin: 1.5 mg/dL — ABNORMAL HIGH (ref 0.3–1.2)
Total Protein: 5.8 g/dL — ABNORMAL LOW (ref 6.5–8.1)

## 2022-11-10 LAB — CULTURE, BLOOD (ROUTINE X 2): Culture: NO GROWTH

## 2022-11-10 LAB — SEDIMENTATION RATE: Sed Rate: 5 mm/hr (ref 0–20)

## 2022-11-10 MED ORDER — METOCLOPRAMIDE HCL 5 MG/ML IJ SOLN
10.0000 mg | Freq: Once | INTRAMUSCULAR | Status: AC
  Administered 2022-11-10: 10 mg via INTRAVENOUS
  Filled 2022-11-10: qty 2

## 2022-11-10 NOTE — Progress Notes (Signed)
Mobility Specialist - Progress Note   11/10/22 0900  Mobility  Activity Ambulated with assistance in hallway;Stood at bedside;Dangled on edge of bed  Level of Assistance Standby assist, set-up cues, supervision of patient - no hands on  Assistive Device Front wheel walker  Distance Ambulated (ft) 60 ft  Activity Response Tolerated well  Mobility Referral Yes  $Mobility charge 1 Mobility  Mobility Specialist Start Time (ACUTE ONLY) 0844  Mobility Specialist Stop Time (ACUTE ONLY) 0902  Mobility Specialist Time Calculation (min) (ACUTE ONLY) 18 min    Pt semi-supine in bed on RA upon arrival. Pt completes bed mobility indep. Pt STS and ambulates in hallway SBA. Pt left in recliner with needs in reach.   Terrilyn Saver  Mobility Specialist  11/10/22 9:45 AM

## 2022-11-10 NOTE — Progress Notes (Addendum)
Subjective: He had recurrence of his symptoms this morning.  He states that he has significant left-sided head pain extending down into his neck.  His left arm and left leg movements are also limited by pain.  He complains of blurry vision, predominantly out of his left eye but also some in his right eye.  Exam: Vitals:   11/10/22 0401 11/10/22 0712  BP: 130/86 (!) 140/94  Pulse: 81 74  Resp: 18 16  Temp: 97.9 F (36.6 C) 97.9 F (36.6 C)  SpO2: 95% 95%   Gen: In bed, NAD Resp: non-labored breathing, no acute distress Abd: soft, nt  Neuro: MS: Awake, alert, interactive and appropriate CN: Mild left facial weakness (suspect baseline), Motor: He has 5/5 strength on the right, on the left, he actively resists lifting up his left arm, once I do lifted he is able to hold it with only mild drift, but when testing confrontation he is extremely weak.  Likewise with the leg, once I lifted off the bed, he only has mild drift but is very resistant to any type of movement due to pain. Sensory: Diminished on the left to light touch, he splits midline on the forehead  Pertinent Labs: Creatinine 1.34 LDL 115  Echo with no embolic source CTA with some intracranial athero, but no LVO MRI brain is negative  Impression: 67 year old male with a history of previous stroke who had transient weakness that resolved with tenecteplase.  He now has recurrence of symptoms, in talking with him he has had similar symptoms previously.  I do wonder if this could represent complicated migraine.  With his recently receiving tenecteplase, I do not think he is a candidate again at this time, and would not pursue that.  If his MRI is again negative, I think alternative diagnosis to cerebrovascular disease and he may need to be considered, including possibly complicated migraine.    Recommendations: 1) Continue crestor 20mg (increased from home 5mg ) 2) aspirin 81 mg and Plavix 75 mg daily  3) MRI brain and  c-spine 4) if repeat imaging is negative, would treat as migraine and neurology be available as needed.  Ritta Slot, MD [facility] [phone]  If 7pm- 7am, please page neurology on call as listed in AMION.

## 2022-11-10 NOTE — Plan of Care (Signed)

## 2022-11-10 NOTE — Progress Notes (Signed)
Progress Note   Patient: Devin White ZOX:[id number] DOB: 1956-04-06 DOA: 11/05/2022     3 DOS: the patient was seen and examined on 11/10/2022   Brief hospital course: 67 y.o. male with medical history significant of cirrhosis s/p liver transplant on chronic immunosuppressive therapy (tacrolimus and mycophenolate mofetil), frequent UTI on suppressive antibiotics, gout on chronic prednisone, HFpEF, MV thrombosis, hypertension who presents to the ED with abdominal pain.   Devin White states that for the last 4 weeks, he has been experiencing gradually worsening suprapubic and bilateral lower quadrant abdominal pain that is significantly worsened with urinating.  In addition, he notes that he has been urinating more frequently.  He notes that over the last several days, his symptoms have worsened more quickly.  He notes that over the last 24 to 48 hours, he has been experiencing nausea with vomiting and difficulty tolerating p.o. intake.  In addition, he awoke today with significant malaise.  He endorses recurrent hot and cold chills/sweats that have been going on for the last 2 weeks.  He denies any palpitations or shortness of breath at rest.   Devin White endorses dyspnea on exertion that seems to have gotten worse since his symptoms began but states that is otherwise chronic.  He endorses occasional chest pain that lasts for several minutes before self resolving.  He notes that episodes occur every 8 to 9 days.  5/22.  Reviewed CT scan of the abdomen and urine analysis.  Continue antibiotics for now.  Will give 1 dose of IV Solu-Medrol with bilateral sacroiliac pain. 5/23.  Patient had left lower extremity weakness and left facial numbness.  A code stroke was called.  CT angio and CT head negative.  Seen by teleneurology TNK given.  Patient's left leg weakness has improved. 5/24.  MRI of the brain negative for acute event.  Neurology started on aspirin and Plavix and increased dose of Crestor.   Transfer out of ICU.  Try to control blood pressure by increasing Norvasc to 10 mg daily. 5/25.  Had sweating episodes again last night.  Was having them before coming into the hospital.  Prostate tender on exam.  Switch antibiotics over to Levaquin.  With blood pressure being high this morning I increased the lisinopril also. 5/26.  Patient after walking with mobility specialist had left-sided weakness, facial numbness and headache.  MRI of the brain and cervical spine ordered.  Assessment and Plan: * Acute CVA (cerebrovascular accident) (HCC) TNK given in the morning of 5/23.  Patient had left leg weakness and left facial numbness and headache.  MRI negative for acute event and echocardiogram shows a normal ejection fraction.  Case discussed with neurology.  Aspirin and Plavix (for 21 days) and increased dose of Crestor.  Patient with repeat symptoms today on 5/26 of left-sided weakness, left-sided facial numbness.  MRI of cervical spine and brain ordered but not officially read yet.  Wondering if this could be migraine.  Dose of Reglan given by neurology.  Acute bacterial prostatitis Likely partially treated with Rocephin but worsened after switching over to Keflex.  Switch antibiotics over to Levaquin on 5/25..  Prostate exam on 5/25.  Likely the cause of the patient's sweating episodes.  Hypertension Continue increased dose of Norvasc to 10 mg daily and increase dose of lisinopril to 20 mg. As needed iv labetalol.   Acute suprapubic pain Wondering if prostatitis was the cause of this all long.  Urine analysis does not look impressive.  Urine culture multiple  species.  CT scan does not show any etiology of his pain.  Sepsis ruled out. Urinating better.  Pain of both sacroiliac joints Right greater than left.  Chronic prednisone  Stage 3b chronic kidney disease (HCC) Creatinine 1.81 on presentation down to 1.53 today.   Liver transplant status (HCC) History of cirrhosis s/p liver  transplantation on tacrolimus and CellCept and prednisone.  Chronic diastolic heart failure (HCC) No signs of heart failure.  Elevated liver function tests Continue to monitor  Obesity (BMI 30-39.9) BMI 35.70 with current height and weight in computer.  BPH (benign prostatic hyperplasia) With hesitancy and symptoms.  Continue Flomax  Chronic tophaceous gout - Continue home prednisone        Subjective: Called to see patient with recurrence of left-sided weakness, left-sided facial numbness and headache.  Case discussed with neurology and repeat MRI of the brain was ordered and MRI of the cervical spine.  Physical Exam: Vitals:   11/09/22 1943 11/10/22 0401 11/10/22 0712 11/10/22 1122  BP: 119/65 130/86 (!) 140/94 104/67  Pulse: 87 81 74 82  Resp: 18 18 16 20   Temp: 98 F (36.7 C) 97.9 F (36.6 C) 97.9 F (36.6 C) 97.6 F (36.4 C)  TempSrc:   Oral Oral  SpO2: 94% 95% 95% 94%  Weight:      Height:       Physical Exam HENT:     Head: Normocephalic.     Mouth/Throat:     Pharynx: No oropharyngeal exudate.  Eyes:     General: Lids are normal.     Conjunctiva/sclera: Conjunctivae normal.  Cardiovascular:     Rate and Rhythm: Normal rate and regular rhythm.     Heart sounds: Normal heart sounds, S1 normal and S2 normal.  Abdominal:     Palpations: Abdomen is soft.     Tenderness: There is no abdominal tenderness.  Musculoskeletal:     Right lower leg: No swelling.     Left lower leg: No swelling.  Skin:    General: Skin is warm.     Findings: No rash.  Neurological:     Mental Status: He is alert and oriented to person, place, and time.     Data Reviewed: Reviewed MRI of brain and cervical spine with neurology.  Still awaiting official read. Creatinine 1.53.  ALT 72, total bilirubin 1.5  Family Communication: Updated patient's niece on the phone.  Disposition: Status is: Inpatient Remains inpatient appropriate because: Patient with left-sided weakness  and MRI of the brain and cervical spine ordered.  Planned Discharge Destination: Home with Home Health    Time spent: 28 minutes  Author: Alford Highland, MD 11/10/2022 2:32 PM  For on call review [email or web address].

## 2022-11-10 NOTE — Progress Notes (Signed)
PT Cancellation Note  Patient Details Name: DAEL CANNADA MRN: [id number] DOB: 1956-01-16   Cancelled Treatment:    Reason Eval/Treat Not Completed: Patient at procedure or test/unavailable (Patient is off the floor for testing after recurrence of neurologic symptoms this morning. PT to follow up as appropriate.)  Donna Bernard, PT, MPT  Ina Homes 11/10/2022, 12:16 PM

## 2022-11-11 DIAGNOSIS — I639 Cerebral infarction, unspecified: Secondary | ICD-10-CM | POA: Diagnosis not present

## 2022-11-11 DIAGNOSIS — I10 Essential (primary) hypertension: Secondary | ICD-10-CM | POA: Diagnosis not present

## 2022-11-11 DIAGNOSIS — R102 Pelvic and perineal pain: Secondary | ICD-10-CM | POA: Diagnosis not present

## 2022-11-11 DIAGNOSIS — N410 Acute prostatitis: Secondary | ICD-10-CM | POA: Diagnosis not present

## 2022-11-11 MED ORDER — POLYETHYLENE GLYCOL 3350 17 G PO PACK: 17.0000 g | PACK | Freq: Every day | ORAL | 0 refills | Status: DC | PRN

## 2022-11-11 MED ORDER — LISINOPRIL 20 MG PO TABS: 20.0000 mg | ORAL_TABLET | Freq: Every day | ORAL | 0 refills | Status: DC

## 2022-11-11 MED ORDER — ROSUVASTATIN CALCIUM 20 MG PO TABS: 20.0000 mg | ORAL_TABLET | Freq: Every day | ORAL | 0 refills | Status: DC

## 2022-11-11 MED ORDER — ASPIRIN 81 MG PO TBEC: 81.0000 mg | DELAYED_RELEASE_TABLET | Freq: Every day | ORAL | 0 refills | Status: AC

## 2022-11-11 MED ORDER — LEVOFLOXACIN 500 MG PO TABS: 500.0000 mg | ORAL_TABLET | Freq: Every day | ORAL | 0 refills | Status: DC

## 2022-11-11 MED ORDER — AMLODIPINE BESYLATE 10 MG PO TABS: 10.0000 mg | ORAL_TABLET | Freq: Every day | ORAL | 0 refills | Status: DC

## 2022-11-11 MED ORDER — HYDROCODONE-ACETAMINOPHEN 5-325 MG PO TABS: 1.0000 | ORAL_TABLET | Freq: Three times a day (TID) | ORAL | 0 refills | Status: DC | PRN

## 2022-11-11 MED ORDER — CLOPIDOGREL BISULFATE 75 MG PO TABS: 75.0000 mg | ORAL_TABLET | Freq: Every day | ORAL | 0 refills | Status: AC

## 2022-11-11 NOTE — Progress Notes (Signed)
Physical Therapy Treatment Patient Details Name: Devin White MRN: [id number] DOB: 09-28-1955 Today's Date: 11/11/2022   History of Present Illness Pt is a 67 y.o. male presenting to hospital 11/05/22 with c/o back pain, abdominal pain, urinary frequency, dizziness, generalized weakness, and SOB.  Pt admitted with acute suprapubic pain and pain of B sacroiliac joints.  Transferred to ICU 5/23 secondary to stroke symptoms--L sided weakness--s/p TNK.   PMH includes h/o liver failure d/t NASH s/p transplant on immunosuppresive therapy, htn, GERD, gout, BPH, kidney stones, CKD, sleep apnea, CVA, recurrent UTI's, CHF, lumbar surgery.    PT Comments    Pt received upright in bed agreeable to PT. Reports discharging today and agreeable for stairs assessment prior to leaving. Pt is mod-I with bed mobility and supervision for STS to RW to SPT to recliner. Transported to steps via recliner due to far distance. Pt completes steps safely with rail use and no LOB. Pt returning to room completing 60' of step through gait without AD and with mild imbalance but no need of UE support. Pt voices no concerns with return home agreeable to [facility] set up services Educated use of AD as needed particularly with community distances to reduce falls risk and improve energy conservation. Pt understanding with all needs in reach. D/c recs remain appropriate.    Recommendations for follow up therapy are one component of a multi-disciplinary discharge planning process, led by the attending physician.  Recommendations may be updated based on patient status, additional functional criteria and insurance authorization.     Assistance Recommended at Discharge PRN  Patient can return home with the following A little help with walking and/or transfers;A little help with bathing/dressing/bathroom;Assistance with cooking/housework;Assist for transportation;Help with stairs or ramp for entrance   Equipment Recommendations  None  recommended by PT    Recommendations for Other Services       Precautions / Restrictions Precautions Precautions: Fall Restrictions Weight Bearing Restrictions: No     Mobility  Bed Mobility Overal bed mobility: Modified Independent Bed Mobility: Supine to Sit, Sit to Supine     Supine to sit: HOB elevated, Modified independent (Device/Increase time) Sit to supine: Modified independent (Device/Increase time)     Patient Response: Cooperative  Transfers Overall transfer level: Needs assistance Equipment used: Rolling walker (2 wheels) Transfers: Sit to/from Stand Sit to Stand: Supervision           General transfer comment: Safe hand placement    Ambulation/Gait Ambulation/Gait assistance: Supervision Gait Distance (Feet): 60 Feet Assistive device: None Gait Pattern/deviations: Step-through pattern, Decreased step length - right, Decreased step length - left       General Gait Details: ambulating 74' with step through gait without evidence of imbalance. Reports slwoed cadence compared to baseline   Stairs Stairs: Yes Stairs assistance: Modified independent (Device/Increase time) Stair Management: One rail Right, One rail Left, Alternating pattern, Forwards Number of Stairs: 4 General stair comments: asc/desc safely with rail use   Wheelchair Mobility    Modified Rankin (Stroke Patients Only)       Balance Overall balance assessment: Mild deficits observed, not formally tested                                          Cognition Arousal/Alertness: Awake/alert Behavior During Therapy: WFL for tasks assessed/performed Overall Cognitive Status: Within Functional Limits for tasks assessed  Exercises Other Exercises Other Exercises: reviewed use of AD as needed in home setting and use for community distances to improve energy conservation and reduced falls risk    General  Comments        Pertinent Vitals/Pain Pain Assessment Pain Assessment: No/denies pain    Home Living                          Prior Function            PT Goals (current goals can now be found in the care plan section) Acute Rehab PT Goals Patient Stated Goal: to improve mobility PT Goal Formulation: With patient Time For Goal Achievement: 11/22/22 Potential to Achieve Goals: Good Progress towards PT goals: Progressing toward goals    Frequency    Min 4X/week      PT Plan Current plan remains appropriate    Co-evaluation              AM-PAC PT "6 Clicks" Mobility   Outcome Measure  Help needed turning from your back to your side while in a flat bed without using bedrails?: None Help needed moving from lying on your back to sitting on the side of a flat bed without using bedrails?: None Help needed moving to and from a bed to a chair (including a wheelchair)?: A Little Help needed standing up from a chair using your arms (e.g., wheelchair or bedside chair)?: A Little Help needed to walk in hospital room?: A Little Help needed climbing 3-5 steps with a railing? : A Little 6 Click Score: 20    End of Session Equipment Utilized During Treatment: Gait belt Activity Tolerance: Patient tolerated treatment well Patient left: in bed;with call bell/phone within reach;with bed alarm set Nurse Communication: Mobility status PT Visit Diagnosis: Other abnormalities of gait and mobility (R26.89);Other symptoms and signs involving the nervous system (Z61.096)     Time: 0454-0981 PT Time Calculation (min) (ACUTE ONLY): 18 min  Charges:  $Therapeutic Activity: 8-22 mins                     Delphia Grates. Fairly IV, PT, DPT Physical Therapist- [place]  [facility]  11/11/2022, 11:15 AM

## 2022-11-11 NOTE — TOC Transition Note (Signed)
Transition of Care [facility]) - CM/SW Discharge Note   Patient Details  Name: Devin White MRN: [id number] Date of Birth: June 15, 1956  Transition of [facility] CM/SW Contact:  Allena Katz, LCSW Phone Number: 11/11/2022, 9:29 AM   Clinical Narrative:   Pt has orders to discharge home. Pt declines HH services a this time. No further TOC needs. TOC signing off.     Final next level of care: Home/Self Care Barriers to Discharge: Barriers Resolved   Patient Goals and CMS Choice      Discharge Placement                         Discharge Plan and Services Additional resources added to the After Visit Summary for                                       Social Determinants of Health (SDOH) Interventions SDOH Screenings   Food Insecurity: No Food Insecurity (11/06/2022)  Housing: Low Risk  (11/06/2022)  Transportation Needs: No Transportation Needs (11/06/2022)  Utilities: Not At Risk (11/06/2022)  Depression (PHQ2-9): Low Risk  (07/16/2022)  Financial Resource Strain: Low Risk  (07/16/2022)  Physical Activity: Insufficiently Active (07/16/2022)  Social Connections: Socially Isolated (07/16/2022)  Stress: No Stress Concern Present (07/16/2022)  Tobacco Use: Low Risk  (11/06/2022)     Readmission Risk Interventions    11/09/2022    1:38 PM 01/31/2022    9:58 AM 11/06/2021   11:09 AM  Readmission Risk Prevention Plan  Transportation Screening Complete Complete Complete  PCP or Specialist Appt within 5-7 Days Complete  Complete  PCP or Specialist Appt within 3-5 Days  Complete   Home Care Screening Complete  Complete  Medication Review (RN CM) Referral to Pharmacy  Referral to Pharmacy  Social Work Consult for Recovery Care Planning/Counseling  Complete   Palliative Care Screening  Not Applicable   Medication Review [employer])  Complete

## 2022-11-11 NOTE — Care Management Important Message (Signed)
Important Message  Patient Details  Name: Devin White MRN: [id number] Date of Birth: 05-30-1956   Medicare Important Message Given:  Yes     Johnell Comings 11/11/2022, 10:41 AM

## 2022-11-11 NOTE — Discharge Summary (Signed)
Physician Discharge Summary   Patient: Devin White MRN: [id number] DOB: May 04, 1956  Admit date:     11/05/2022  Discharge date: 11/11/22  Discharge Physician: Alford Highland   PCP: Karie Schwalbe, MD   Recommendations at discharge:   Follow-up PCP 5 days Follow-up [facility] Keep follow-up appointment with liver transplant team  Discharge Diagnoses: Principal Problem:   Acute CVA (cerebrovascular accident) [facility]) Active Problems:   Acute bacterial prostatitis   Hypertension   Acute suprapubic pain   Stage 3b chronic kidney disease (HCC)   Pain of both sacroiliac joints   Liver transplant status (HCC)   Chronic diastolic heart failure (HCC)   Chronic tophaceous gout   BPH (benign prostatic hyperplasia)   Obesity (BMI 30-39.9)   Elevated liver function tests   Hospital Course: 67 y.o. male with medical history significant of cirrhosis s/p liver transplant on chronic immunosuppressive therapy (tacrolimus and mycophenolate mofetil), frequent UTI on suppressive antibiotics, gout on chronic prednisone, HFpEF, MV thrombosis, hypertension who presents to the ED with abdominal pain.   Devin White states that for the last 4 weeks, he has been experiencing gradually worsening suprapubic and bilateral lower quadrant abdominal pain that is significantly worsened with urinating.  In addition, he notes that he has been urinating more frequently.  He notes that over the last several days, his symptoms have worsened more quickly.  He notes that over the last 24 to 48 hours, he has been experiencing nausea with vomiting and difficulty tolerating p.o. intake.  In addition, he awoke today with significant malaise.  He endorses recurrent hot and cold chills/sweats that have been going on for the last 2 weeks.  He denies any palpitations or shortness of breath at rest.   Devin White endorses dyspnea on exertion that seems to have gotten worse since his symptoms began but states that is  otherwise chronic.  He endorses occasional chest pain that lasts for several minutes before self resolving.  He notes that episodes occur every 8 to 9 days.  5/22.  Reviewed CT scan of the abdomen and urine analysis.  Continue antibiotics for now.  Will give 1 dose of IV Solu-Medrol with bilateral sacroiliac pain. 5/23.  Patient had left lower extremity weakness and left facial numbness.  A code stroke was called.  CT angio and CT head negative.  Seen by teleneurology TNK given.  Patient's left leg weakness has improved. 5/24.  MRI of the brain negative for acute event.  Neurology started on aspirin and Plavix and increased dose of Crestor.  Transfer out of ICU.  Try to control blood pressure by increasing Norvasc to 10 mg daily. 5/25.  Had sweating episodes again last night.  Was having them before coming into the hospital.  Prostate tender on exam.  Switch antibiotics over to Levaquin.  With blood pressure being high this morning I increased the lisinopril also. 5/26.  Patient after walking with mobility specialist had left-sided weakness, facial numbness and headache.  MRI of the brain negative.  MRI of the cervical spine does show some foraminal narrowing bilaterally but likely would not cause a facial symptoms. 5/27.  Patient feeling better today and wanting to go home.  Assessment and Plan: * Acute CVA (cerebrovascular accident) (HCC) TNK given in the morning of 5/23.  Patient had left leg weakness and left facial numbness and headache.  MRI negative for acute event and echocardiogram shows a normal ejection fraction.  Case discussed with neurology.  Aspirin (lifelong) and Plavix (  for 21 days total) and increased dose of Crestor.  Patient with repeat symptoms today on 5/26 of left-sided weakness, left-sided facial numbness.  MRI of brain is negative for acute event.  MRI cervical spine would not explain the patient's symptoms.  Wondering if this could be migraine.  Dose of Reglan given by neurology.   Patient feeling better and wanting to go home.  Home health set up.  Acute bacterial prostatitis Likely partially treated with Rocephin but worsened after switching over to Keflex.  Switch antibiotics over to Levaquin on 5/25.  Prostate exam on 5/25 with a tender prostate.  Likely the cause of the patient's sweating episodes.  Prescribe Levaquin for another 26 days.  Risk of tendon rupture explained.  Hypertension Continue increased dose of Norvasc to 10 mg daily and increase dose of lisinopril to 20 mg.   Acute suprapubic pain Wondering if prostatitis was the cause of this all long.  Urine analysis does not look impressive.  Urine culture multiple species.  CT scan does not show any etiology of his pain.  Sepsis ruled out. Urinating better.  Pain of both sacroiliac joints Right greater than left.  Chronic prednisone  Stage 3b chronic kidney disease (HCC) Creatinine 1.81 on presentation down to 1.53.   Liver transplant status (HCC) History of cirrhosis s/p liver transplantation on tacrolimus and CellCept and prednisone.  Advised to follow-up with liver transplant team.  Chronic diastolic heart failure (HCC) No signs of heart failure during this hospital course.  Elevated liver function tests Continue to monitor as outpatient.  ALT slightly elevated at 72.  Total bilirubin 1.5.  AST 30, alkaline phosphatase 45.  Obesity (BMI 30-39.9) BMI 35.70 with current height and weight in computer.  BPH (benign prostatic hyperplasia) With hesitancy and symptoms.  Continue Flomax  Chronic tophaceous gout Continue home prednisone         Consultants: Neurology Procedures performed: None Disposition: Home health Diet recommendation:  Cardiac diet DISCHARGE MEDICATION: Allergies as of 11/11/2022   No Known Allergies      Medication List     STOP taking these medications    aspirin 325 MG tablet Replaced by: aspirin EC 81 MG tablet   furosemide 40 MG tablet Commonly known  as: LASIX   trimethoprim 100 MG tablet Commonly known as: TRIMPEX       TAKE these medications    acetaminophen 325 MG tablet Commonly known as: TYLENOL Take 650 mg by mouth every 6 (six) hours as needed for mild pain.   amLODipine 10 MG tablet Commonly known as: NORVASC Take 1 tablet (10 mg total) by mouth daily. Start taking on: Nov 12, 2022 What changed:  medication strength how much to take   aspirin EC 81 MG tablet Take 1 tablet (81 mg total) by mouth daily. Swallow whole. Start taking on: Nov 12, 2022 Replaces: aspirin 325 MG tablet   calcium carbonate 500 MG chewable tablet Commonly known as: Tums Chew 1 tablet (200 mg of elemental calcium total) by mouth 2 (two) times daily as needed for indigestion or heartburn.   clopidogrel 75 MG tablet Commonly known as: PLAVIX Take 1 tablet (75 mg total) by mouth daily for 18 days. Start taking on: Nov 12, 2022   HYDROcodone-acetaminophen 5-325 MG tablet Commonly known as: NORCO/VICODIN Take 1 tablet by mouth every 8 (eight) hours as needed for severe pain.   levofloxacin 500 MG tablet Commonly known as: LEVAQUIN Take 1 tablet (500 mg total) by mouth daily for 26  days. Start taking on: Nov 12, 2022   lisinopril 20 MG tablet Commonly known as: ZESTRIL Take 1 tablet (20 mg total) by mouth daily. Start taking on: Nov 12, 2022 What changed:  medication strength how much to take   multivitamin with minerals Tabs tablet Take 1 tablet by mouth daily.   mycophenolate 250 MG capsule Commonly known as: CELLCEPT Take 250-500 mg by mouth 2 (two) times daily. 2 capsules (500 mg) in the morning and one capsule (250 mg) in the evening   omeprazole 20 MG capsule Commonly known as: PRILOSEC Take 1 capsule (20 mg total) by mouth daily.   polyethylene glycol 17 g packet Commonly known as: MIRALAX / GLYCOLAX Take 17 g by mouth daily as needed for mild constipation.   predniSONE 10 MG tablet Commonly known as:  DELTASONE Take 10 mg by mouth daily with breakfast.   rosuvastatin 20 MG tablet Commonly known as: CRESTOR Take 1 tablet (20 mg total) by mouth daily. Start taking on: Nov 12, 2022 What changed:  medication strength how much to take when to take this   senna 8.6 MG Tabs tablet Commonly known as: SENOKOT Take 2 tablets (17.2 mg total) by mouth daily.   tacrolimus 0.5 MG capsule Commonly known as: PROGRAF Take 0.5-1 mg by mouth See admin instructions. Taking 0.5 mg in the morning and 1 mg at bedtime   tamsulosin 0.4 MG Caps capsule Commonly known as: FLOMAX TAKE 1 CAPSULE(0.4 MG) BY MOUTH DAILY What changed: See the new instructions.        Follow-up Information     Tillman Abide I, MD Follow up in 5 day(s).   Specialties: Internal Medicine, Pediatrics Contact information: [address] [place] [address] [phone]         [facility] Follow up.   Why: hospital follow up for stroke versus migraine Contact information: [address]     [address] [place] [address] [phone]               Discharge Exam: Ceasar Mons Weights   11/05/22 1545 11/07/22 0700  Weight: 120.2 kg 119.4 kg   Physical Exam HENT:     Head: Normocephalic.     Mouth/Throat:     Pharynx: No oropharyngeal exudate.  Eyes:     General: Lids are normal.     Conjunctiva/sclera: Conjunctivae normal.  Cardiovascular:     Rate and Rhythm: Normal rate and regular rhythm.     Heart sounds: Normal heart sounds, S1 normal and S2 normal.  Abdominal:     Palpations: Abdomen is soft.     Tenderness: There is no abdominal tenderness.  Musculoskeletal:     Right lower leg: No swelling.     Left lower leg: No swelling.  Skin:    General: Skin is warm.     Findings: No rash.  Neurological:     Mental Status: He is alert and oriented to person, place, and time.      Condition at discharge: stable  The results of significant  diagnostics from this hospitalization (including imaging, microbiology, ancillary and laboratory) are listed below for reference.   Imaging Studies: MR BRAIN WO CONTRAST  Result Date: 11/10/2022 CLINICAL DATA:  Headache with left arm pain EXAM: MRI HEAD WITHOUT CONTRAST TECHNIQUE: Multiplanar, multiecho pulse sequences of the brain and surrounding structures were obtained without intravenous contrast. COMPARISON:  11/08/2022 FINDINGS: Brain: No acute infarct, mass effect or extra-axial collection. No acute or chronic  hemorrhage. There is multifocal hyperintense T2-weighted signal within the white matter. Parenchymal volume and CSF spaces are normal. The midline structures are normal. Vascular: Major flow voids are preserved. Skull and upper cervical spine: Normal calvarium and skull base. Visualized upper cervical spine and soft tissues are normal. Sinuses/Orbits:No paranasal sinus fluid levels or advanced mucosal thickening. No mastoid or middle ear effusion. Normal orbits. IMPRESSION: 1. No acute intracranial abnormality. 2. Multifocal hyperintense T2-weighted signal within the white matter, nonspecific but most commonly seen in the setting of chronic small vessel ischemia. Electronically Signed   By: Deatra Robinson M.D.   On: 11/10/2022 23:11   MR CERVICAL SPINE WO CONTRAST  Result Date: 11/10/2022 CLINICAL DATA:  Headache with left arm pain EXAM: MRI CERVICAL SPINE WITHOUT CONTRAST TECHNIQUE: Multiplanar, multisequence MR imaging of the cervical spine was performed. No intravenous contrast was administered. COMPARISON:  None Available. FINDINGS: Alignment: Mild anterolisthesis of C3 and C4. Straightening of the cervical spine. Vertebrae: No fracture, evidence of discitis, or bone lesion. Cord: Normal signal and morphology. Posterior Fossa, vertebral arteries, paraspinal tissues: Negative. Discs: Multilevel degenerate disc disease with disc height loss and anterior osteophytes. C2-3: No significant disc  bulge. No neural foraminal stenosis. No central canal stenosis. C3-4: Asymmetric disc extrusion with flattening of the thecal sac and severe left neural foraminal stenosis. Mild right neural foraminal stenosis. C4-5: Circumferential disc bulge with flattening of thecal sac and severe bilateral neural foraminal stenosis left worse than the right. C5-6: Circumferential disc bulge with flattening of the thecal sac. Moderate bilateral neural foraminal stenosis right worse than the left. C6-7: Disc osteophyte complex with moderate disc bulge. Mild right and moderate left neural foraminal stenosis. C7-T1: No significant disc bulge. No neural foraminal stenosis. No central canal stenosis. IMPRESSION: 1. No evidence of fracture or subluxation. 2. Multilevel spondylosis as described above. 3. Severe left neural foraminal stenosis at C3-4. 4. Severe bilateral neural foraminal stenosis at C4-5. 5. Moderate bilateral neural foraminal stenosis at C5-6, right worse than left. 6. Mild right and moderate left neural foraminal stenosis at C6-7. Electronically Signed   By: Larose Hires D.O.   On: 11/10/2022 20:44   MR BRAIN WO CONTRAST  Result Date: 11/08/2022 CLINICAL DATA:  Provided history: Stroke, follow-up. EXAM: MRI HEAD WITHOUT CONTRAST TECHNIQUE: Multiplanar, multiecho pulse sequences of the brain and surrounding structures were obtained without intravenous contrast. COMPARISON:  Non-contrast head CT and CT angiogram head/neck 11/07/2022. Brain MRI 11/02/2021. FINDINGS: Brain: No age advanced or lobar predominant parenchymal atrophy. Multifocal T2 FLAIR hyperintense signal abnormality within the cerebral white matter, nonspecific but compatible with moderate chronic small vessel ischemic disease. There are a few nonspecific punctate chronic microhemorrhages scattered within the supratentorial brain. There is no acute infarct. No evidence of an intracranial mass. No extra-axial fluid collection. No midline shift. Vascular:  Maintained flow voids within the proximal large arterial vessels. Dominant left vertebral artery. Skull and upper cervical spine: No focal suspicious marrow lesion. Sinuses/Orbits: No mass or acute finding within the imaged orbits. Mild mucosal thickening within the right maxillary sinus. 18 mm mucous retention cyst within the left maxillary sinus. Small mucous retention cysts within the right sphenoid sinus. Mild mucosal thickening within the left sphenoid sinus. IMPRESSION: 1. No evidence of an acute intracranial abnormality. 2. Moderate chronic small vessel ischemic changes within the cerebral white matter, similar to the prior brain MRI of 09/14/2021. 3. Paranasal sinus disease as described. Electronically Signed   By: Jackey Loge D.O.   On: 11/08/2022  07:38   ECHOCARDIOGRAM COMPLETE  Result Date: 11/07/2022    ECHOCARDIOGRAM REPORT   Patient Name:   DUEL GUDERIAN Date of Exam: 11/07/2022 Medical Rec #:  [id number]        Height:       72.0 in Accession #:    [id number]       Weight:       263.2 lb Date of Birth:  1956/02/17        BSA:          2.394 m Patient Age:    66 years         BP:           180/110 mmHg Patient Gender: M                HR:           74 bpm. Exam Location:  [facility] Procedure: 2D Echo, Cardiac Doppler and Color Doppler Indications:     Stroke  History:         Patient has prior history of Echocardiogram examinations, most                  recent 11/05/2021. CHF, Stroke and TIA; Risk                  Factors:Hypertension and Sleep Apnea. CKD, Liver transplant.  Sonographer:     Mikki Harbor Referring Phys:  [id number] Lanora Manis ACHIENG OUMA Diagnosing Phys: Julien Nordmann MD  Sonographer Comments: Technically difficult study due to poor echo windows, no subcostal window and patient is obese. IMPRESSIONS  1. Left ventricular ejection fraction, by estimation, is 60 to 65%. The left ventricle has normal function. The left ventricle has no regional wall motion abnormalities. There is  moderate left ventricular hypertrophy. Left ventricular diastolic parameters are consistent with Grade I diastolic dysfunction (impaired relaxation).  2. Right ventricular systolic function is normal. The right ventricular size is normal.  3. The mitral valve is normal in structure. No evidence of mitral valve regurgitation. No evidence of mitral stenosis.  4. The aortic valve is normal in structure. Aortic valve regurgitation is not visualized. No aortic stenosis is present.  5. There is borderline dilatation of the aortic root, measuring 39 mm. There is borderline dilatation of the ascending aorta, measuring 36 mm.  6. The inferior vena cava is normal in size with greater than 50% respiratory variability, suggesting right atrial pressure of 3 mmHg. FINDINGS  Left Ventricle: Left ventricular ejection fraction, by estimation, is 60 to 65%. The left ventricle has normal function. The left ventricle has no regional wall motion abnormalities. The left ventricular internal cavity size was normal in size. There is  moderate left ventricular hypertrophy. Left ventricular diastolic parameters are consistent with Grade I diastolic dysfunction (impaired relaxation). Right Ventricle: The right ventricular size is normal. No increase in right ventricular wall thickness. Right ventricular systolic function is normal. Left Atrium: Left atrial size was normal in size. Right Atrium: Right atrial size was normal in size. Pericardium: There is no evidence of pericardial effusion. Mitral Valve: The mitral valve is normal in structure. No evidence of mitral valve regurgitation. No evidence of mitral valve stenosis. MV peak gradient, 3.8 mmHg. The mean mitral valve gradient is 1.0 mmHg. Tricuspid Valve: The tricuspid valve is normal in structure. Tricuspid valve regurgitation is not demonstrated. No evidence of tricuspid stenosis. Aortic Valve: The aortic valve is normal in structure. Aortic valve regurgitation is not visualized. No  aortic stenosis is present. Aortic valve mean gradient measures 4.0 mmHg. Aortic valve peak gradient measures 7.4 mmHg. Aortic valve area, by VTI measures 3.14 cm. Pulmonic Valve: The pulmonic valve was normal in structure. Pulmonic valve regurgitation is not visualized. No evidence of pulmonic stenosis. Aorta: The aortic root is normal in size and structure. There is borderline dilatation of the aortic root, measuring 39 mm. There is borderline dilatation of the ascending aorta, measuring 36 mm. Venous: The inferior vena cava is normal in size with greater than 50% respiratory variability, suggesting right atrial pressure of 3 mmHg. IAS/Shunts: No atrial level shunt detected by color flow Doppler.  LEFT VENTRICLE PLAX 2D LVIDd:         4.90 cm   Diastology LVIDs:         3.30 cm   LV e' medial:    4.13 cm/s LV PW:         1.20 cm   LV E/e' medial:  12.1 LV IVS:        1.50 cm   LV e' lateral:   6.53 cm/s LVOT diam:     2.10 cm   LV E/e' lateral: 7.6 LV SV:         95 LV SV Index:   40 LVOT Area:     3.46 cm  RIGHT VENTRICLE RV Basal diam:  3.55 cm RV Mid diam:    3.30 cm RV S prime:     13.20 cm/s TAPSE (M-mode): 2.9 cm LEFT ATRIUM             Index        RIGHT ATRIUM           Index LA diam:        4.30 cm 1.80 cm/m   RA Area:     17.60 cm LA Vol (A2C):   88.8 ml 37.09 ml/m  RA Volume:   49.50 ml  20.67 ml/m LA Vol (A4C):   44.4 ml 18.54 ml/m LA Biplane Vol: 63.6 ml 26.56 ml/m  AORTIC VALVE                    PULMONIC VALVE AV Area (Vmax):    3.06 cm     PV Vmax:       1.28 m/s AV Area (Vmean):   2.86 cm     PV Peak grad:  6.6 mmHg AV Area (VTI):     3.14 cm AV Vmax:           136.00 cm/s AV Vmean:          97.000 cm/s AV VTI:            0.303 m AV Peak Grad:      7.4 mmHg AV Mean Grad:      4.0 mmHg LVOT Vmax:         120.00 cm/s LVOT Vmean:        80.100 cm/s LVOT VTI:          0.275 m LVOT/AV VTI ratio: 0.91  AORTA Ao Root diam: 3.90 cm Ao Asc diam:  3.60 cm MITRAL VALVE MV Area (PHT): 2.25 cm     SHUNTS MV Area VTI:   4.25 cm    Systemic VTI:  0.28 m MV Peak grad:  3.8 mmHg    Systemic Diam: 2.10 cm MV Mean grad:  1.0 mmHg MV Vmax:       0.98 m/s MV Vmean:  47.6 cm/s MV Decel Time: 337 msec MV E velocity: 49.90 cm/s MV A velocity: 77.80 cm/s MV E/A ratio:  0.64 Julien Nordmann MD Electronically signed by Julien Nordmann MD Signature Date/Time: 11/07/2022/1:09:12 PM    Final    CT ANGIO HEAD NECK W WO CM W PERF (CODE STROKE)  Result Date: 11/07/2022 CLINICAL DATA:  67 year old male code stroke presentation. EXAM: CT ANGIOGRAPHY HEAD AND NECK CT PERFUSION BRAIN TECHNIQUE: Multidetector CT imaging of the head and neck was performed using the standard protocol during bolus administration of intravenous contrast. Multiplanar CT image reconstructions and MIPs were obtained to evaluate the vascular anatomy. Carotid stenosis measurements (when applicable) are obtained utilizing NASCET criteria, using the distal internal carotid diameter as the denominator. Multiphase CT imaging of the brain was performed following IV bolus contrast injection. Subsequent parametric perfusion maps were calculated using RAPID software. RADIATION DOSE REDUCTION: This exam was performed according to the departmental dose-optimization program which includes automated exposure control, adjustment of the mA and/or kV according to patient size and/or use of iterative reconstruction technique. CONTRAST:  OMNIPAQUE IOHEXOL 350 MG/ML SOLN COMPARISON:  Plain head CT 0547 hours today. FINDINGS: CT Brain Perfusion Findings: ASPECTS: 10 CBF (<30%) Volume: 0mL.  No CBF for CBV parameter abnormality. Perfusion (Tmax>6.0s) volume: 0mL Mismatch Volume: Not applicable Infarction Location:Not applicable CTA NECK Skeleton: Bulky cervical spine facet, disc and endplate degeneration. Mild degenerative appearing anterolisthesis of C3 on C4. No acute osseous abnormality identified. Upper chest: Negative. Other neck: Negative. Aortic arch: 3 vessel  arch configuration.  No arch atherosclerosis. Right carotid system: Tortuous brachiocephalic artery and right CCA origin. No right carotid plaque or stenosis to the skull base. Left carotid system: Tortuous proximal left CCA. No left carotid plaque or stenosis to the skull base. Vertebral arteries: Tortuous proximal right subclavian artery with no plaque or stenosis. Non dominant right vertebral artery origin is normal. The right vertebral is diminutive throughout the neck but patent to the skull base with no significant plaque or stenosis identified. No proximal left subclavian artery plaque or stenosis. Normal left vertebral artery origin. Dominant left vertebral artery is patent with mild tortuosity to the skull base. No significant plaque or stenosis. CTA HEAD Posterior circulation: Diminutive right vertebral artery remains patent to the vertebrobasilar junction. Dominant left V4 segment with mild calcified plaque, no significant stenosis. Normal left PICA origin. Right AICA appears dominant. Patent basilar artery is mildly to moderately irregular (series 14, image 18). But there is no significant basilar stenosis. Patent SCA origins. Fetal type bilateral PCA origins, more so the left. Bilateral PCA branches are within normal limits. However, there is mild to moderate irregularity and stenosis of the left posterior communicating artery series 12, image 22. Anterior circulation: Both ICA siphons are patent. Left siphon mild to moderate calcified plaque with no stenosis. Normal left ophthalmic and posterior communicating artery origins. Mild right siphon calcified plaque with no stenosis. Normal right ophthalmic and posterior communicating artery origins. Patent carotid termini, MCA and ACA origins. Dominant right and diminutive left ACA A1 segments. Anterior communicating artery is within normal limits. Bilateral ACA branches are within normal limits, the right A2 is mildly dominant. Left MCA M1 segment and  bifurcation are patent without stenosis. Right MCA M1 segment and trifurcation are patent without stenosis. Bilateral MCA branches are within normal limits. Venous sinuses: Early contrast timing, grossly patent. Anatomic variants: Dominant left vertebral artery, right is diminutive. Fetal type bilateral PCA origins. Dominant right ACA A1. Review of the  MIP images confirms the above findings IMPRESSION: 1. Negative for large vessel occlusion.  Negative CT Perfusion. 2. Intracranial more so than extracranial atherosclerosis. Up to moderate irregularity of the Basilar Artery, but no significant Basilar stenosis. ICA siphon calcified plaque without stenosis. Mild to moderate irregularity and stenosis of the Left Posterior Communicating Artery (fetal Left PCA origin). Electronically Signed   By: Odessa Fleming M.D.   On: 11/07/2022 07:02   CT HEAD CODE STROKE WO CONTRAST  Addendum Date: 11/07/2022   ADDENDUM REPORT: 11/07/2022 06:03 ADDENDUM: Study discussed by telephone with NP BRENDA MORRISON on 11/07/2022 at 0601 hours. Electronically Signed   By: Odessa Fleming M.D.   On: 11/07/2022 06:03   Result Date: 11/07/2022 CLINICAL DATA:  Code stroke. 67 year old male with hypertension, tingling, left vision changes. EXAM: CT HEAD WITHOUT CONTRAST TECHNIQUE: Contiguous axial images were obtained from the base of the skull through the vertex without intravenous contrast. RADIATION DOSE REDUCTION: This exam was performed according to the departmental dose-optimization program which includes automated exposure control, adjustment of the mA and/or kV according to patient size and/or use of iterative reconstruction technique. COMPARISON:  Brain MRI 11/02/2021, head CT 12/29/2021. FINDINGS: Brain: Cerebral volume remains normal for age. No midline shift, ventriculomegaly, mass effect, evidence of mass lesion, intracranial hemorrhage or evidence of cortically based acute infarction. Patchy and confluent bilateral white matter hypodensity  appears stable from the exam is last year. Vascular: Calcified atherosclerosis at the skull base. No suspicious intracranial vascular hyperdensity. Skull: No acute osseous abnormality identified. Sinuses/Orbits: Chronic mild maxillary sinus mucosal thickening and left maxillary retention cyst. Small fluid level in the right sphenoid sinus has decreased. Tympanic cavities and mastoids appear well aerated. Other: No gaze deviation.  Negative scalp soft tissues. ASPECTS [facility] Stroke Program Early CT Score) Total score (0-10 with 10 being normal): 10 IMPRESSION: 1. No acute cortically based infarct or acute intracranial hemorrhage identified. ASPECTS 10. 2. Stable non contrast CT appearance of white matter disease since last year. Electronically Signed: By: Odessa Fleming M.D. On: 11/07/2022 05:56   CT ABDOMEN PELVIS W CONTRAST  Result Date: 11/05/2022 CLINICAL DATA:  Lower back and abdominal pain EXAM: CT ABDOMEN AND PELVIS WITH CONTRAST TECHNIQUE: Multidetector CT imaging of the abdomen and pelvis was performed using the standard protocol following bolus administration of intravenous contrast. RADIATION DOSE REDUCTION: This exam was performed according to the departmental dose-optimization program which includes automated exposure control, adjustment of the mA and/or kV according to patient size and/or use of iterative reconstruction technique. CONTRAST:  75mL OMNIPAQUE IOHEXOL 300 MG/ML  SOLN COMPARISON:  Renal ultrasound 01/30/2022. CT 12/25/2021 without contrast. Older exams as well FINDINGS: Lower chest: There is some linear opacity lung bases likely scar or atelectasis. No pleural effusion. Hepatobiliary: No focal liver abnormality is seen. Status post cholecystectomy. No biliary dilatation. Pancreas: Mild global atrophy of the pancreas without mass lesion. Spleen: Normal in size without focal abnormality. Adrenals/Urinary Tract: With the adrenal gland is preserved. The right is minimally thickened and nodular,  unchanged from previous. Prominent bilateral renal sinus fat without collecting system dilatation. Punctate bilateral nonobstructing stones. No ureteral stones. Bilateral Bosniak 1 renal cysts. No specific imaging follow-up. The ureters have normal course and caliber down to the bladder. Preserved contour to the urinary bladder. Stomach/Bowel: On this non oral contrast exam, the stomach is nondilated with some luminal debris. There is some high attenuation material within the stomach near the lesser curve, unchanged from previous. Please correlate for a treatment  clip. Small bowel is nondilated. There are several loops of small bowel particularly the ileum which has some subtle low-density within the wall, possible fat. Please correlate for any history of chronic inflammatory states. Surgical changes near the base of the cecum. The appendix is not seen. The large bowel is nondilated. Few left-sided colonic diverticula. Vascular/Lymphatic: Aortic atherosclerosis. No enlarged abdominal or pelvic lymph nodes. Reproductive: Prostate is unremarkable. Other: Small fat containing umbilical hernia. Musculoskeletal: Scattered degenerative changes of the spine and pelvis. Streak artifact related to the patient's fixation hardware along the lumbar spine. IMPRESSION: Punctate bilateral nonobstructing renal stones. Mild scattered colonic stool. Surgical changes in the right lower quadrant. The appendix is poorly seen. No pericecal stranding or fluid. Please correlate with surgical history as well. Nonspecific mild low-density along the wall of several loops of small bowel. Please correlate for any history of chronic inflammatory state. No obstruction, free air or free fluid. Electronically Signed   By: Karen Kays M.D.   On: 11/05/2022 18:55   DG Chest 2 View  Result Date: 11/05/2022 CLINICAL DATA:  Shortness of breath with lower back pain and lower abdominal pain. EXAM: CHEST - 2 VIEW COMPARISON:  January 30, 2022 FINDINGS:  The heart size and mediastinal contours are within normal limits. Low lung volumes are noted. Mild, chronic appearing increased interstitial lung markings are seen. This is very mildly increased in severity when compared to the prior study. There is no evidence of focal consolidation, pleural effusion or pneumothorax. Radiopaque surgical clips are seen within the right upper quadrant. The visualized skeletal structures are unremarkable. IMPRESSION: Very mildly increased interstitial lung markings, likely chronic in nature. A mild superimposed component of interstitial edema cannot be excluded. Electronically Signed   By: Aram Candela M.D.   On: 11/05/2022 16:10    Microbiology: Results for orders placed or performed during the hospital encounter of 11/05/22  Blood Culture (routine x 2)     Status: None   Collection Time: 11/05/22  4:46 PM   Specimen: BLOOD  Result Value Ref Range Status   Specimen Description BLOOD LEFT ANTECUBITAL  Final   Special Requests   Final    BOTTLES DRAWN AEROBIC AND ANAEROBIC Blood Culture adequate volume   Culture   Final    NO GROWTH 5 DAYS Performed at [facility], [address] [place], [place] [address]    Report Status 11/10/2022 FINAL  Final  Urine Culture (for pregnant, neutropenic or urologic patients or patients with an indwelling urinary catheter)     Status: Abnormal   Collection Time: 11/05/22  4:48 PM   Specimen: Urine, Clean Catch  Result Value Ref Range Status   Specimen Description   Final    URINE, CLEAN CATCH Performed at [facility], [address], [place], [place] [address]    Special Requests   Final    NONE Performed at [facility], [address], [place], [place] [address]    Culture MULTIPLE SPECIES PRESENT, SUGGEST RECOLLECTION (A)  Final   Report Status 11/07/2022 FINAL  Final  Blood Culture (routine x 2)     Status: None   Collection Time: 11/05/22  5:08 PM   Specimen: BLOOD  Result  Value Ref Range Status   Specimen Description BLOOD RIGHT ANTECUBITAL  Final   Special Requests   Final    BOTTLES DRAWN AEROBIC AND ANAEROBIC Blood Culture adequate volume   Culture   Final    NO GROWTH 5 DAYS Performed  at [facility], [address], [place], [place] [address]    Report Status 11/10/2022 FINAL  Final  MRSA culture     Status: None   Collection Time: 11/07/22  9:55 AM   Specimen: Nose; Body Fluid  Result Value Ref Range Status   Specimen Description   Final    NOSE Performed at [facility], [address], [place], [place] [address]    Special Requests   Final    NONE Performed at [facility], [address], [place], [place] [address]    Culture   Final    NO MRSA DETECTED Performed at [facility], [address], [place], [place] [address]    Report Status 11/09/2022 FINAL  Final    Labs: CBC: Recent Labs  Lab 11/05/22 1548 11/06/22 0629 11/08/22 0734 11/09/22 0444  WBC 11.9* 8.8 9.0 9.4  NEUTROABS 9.6*  --   --   --   HGB 17.0 15.0 14.6 15.4  HCT 51.5 45.3 45.0 45.5  MCV 94.8 95.0 95.3 93.6  PLT 186 144* 141* 148*   Basic Metabolic Panel: Recent Labs  Lab 11/05/22 1548 11/06/22 0629 11/08/22 0734 11/09/22 0444 11/10/22 0437  NA 135 139 139 141 138  K 4.4 4.1 3.9 3.8 3.7  CL 101 107 108 108 105  CO2 23 24 24 25 24   GLUCOSE 333* 121* 143* 129* 138*  BUN 20 18 23 23  30*  CREATININE 1.81* 1.57* 1.58* 1.34* 1.53*  CALCIUM 8.9 8.5* 8.6* 8.9 8.6*  PHOS  --   --  4.1 3.8  --    Liver Function Tests: Recent Labs  Lab 11/05/22 1548 11/06/22 0629 11/08/22 0734 11/09/22 0444 11/10/22 0437  AST 56* 40 31  --  30  ALT 120* 93* 76*  --  72*  ALKPHOS 60 49 44  --  45  BILITOT 1.6* 1.3* 1.1  --  1.5*  PROT 6.7 5.7* 5.8*  --  5.8*  ALBUMIN 4.2 3.5 3.4*  3.5 3.5 3.6   CBG: Recent Labs  Lab 11/07/22 0530 11/07/22 0658  GLUCAP 199* 196*    Discharge time spent: greater than 30  minutes.  Signed: Alford Highland, MD [facility] 11/11/2022

## 2022-11-12 ENCOUNTER — Telehealth: Payer: Self-pay | Admitting: *Deleted

## 2022-11-12 NOTE — Transitions of Care (Post Inpatient/ED Visit) (Signed)
   11/12/2022  Name: Devin White MRN: [id number] DOB: 1956-02-01  Today's TOC FU Call Status: Today's TOC FU Call Status:: Unsuccessul Call (1st Attempt) Unsuccessful Call (1st Attempt) Date: 11/11/22  Attempted to reach the patient regarding the most recent Inpatient/ED visit.  Follow Up Plan: Additional outreach attempts will be made to reach the patient to complete the Transitions of Care (Post Inpatient/ED visit) call.   Irving Shows [facility], BSN RN Case Manager [facility] [place] [phone]

## 2022-11-13 ENCOUNTER — Ambulatory Visit (INDEPENDENT_AMBULATORY_CARE_PROVIDER_SITE_OTHER): Payer: 59

## 2022-11-13 DIAGNOSIS — Z944 Liver transplant status: Secondary | ICD-10-CM

## 2022-11-13 DIAGNOSIS — I10 Essential (primary) hypertension: Secondary | ICD-10-CM

## 2022-11-13 DIAGNOSIS — I639 Cerebral infarction, unspecified: Secondary | ICD-10-CM

## 2022-11-13 NOTE — Patient Instructions (Signed)
Please call the care guide team at [phone] if you need to cancel or reschedule your appointment.   If you are experiencing a Mental Health or Behavioral Health Crisis or need someone to talk to, please call the Suicide and Crisis Lifeline: 988 call the [place] National Suicide Prevention Lifeline: 3197138933 or TTY: (234) 776-5537 TTY 906-480-6819) to talk to a trained counselor call 1-800-273-TALK (toll free, 24 hour hotline) go to [facility] [address] [place] [phone])   Following is a copy of the CCM Program Consent:  CCM service includes personalized support from designated clinical staff supervised by the physician, including individualized plan of care and coordination with other care providers 24/7 contact phone numbers for assistance for urgent and routine care needs. Service will only be billed when office clinical staff spend 20 minutes or more in a month to coordinate care. Only one practitioner may furnish and bill the service in a calendar month. The patient may stop CCM services at amy time (effective at the end of the month) by phone call to the office staff. The patient will be responsible for cost sharing (co-pay) or up to 20% of the service fee (after annual deductible is met)  Following is a copy of your full provider care plan:  Today's TOC FU Call Status: Today's TOC FU Call Status:: Successful TOC FU Call Competed TOC FU Call Complete Date: 11/13/22   Transition Care Management Follow-up Telephone Call Date of Discharge: 11/11/22 Discharge Facility[facility]) Type of Discharge: Inpatient Admission Primary Inpatient Discharge Diagnosis:: Acute CVA How have you been since you were released from the hospital?: Better Any questions or concerns?: Yes Patient Questions/Concerns:: Ask about getting a referral to see neurologist in [place] instead of [place] Patient Questions/Concerns  Addressed: Provided Patient Educational Materials, Notified Provider of Patient Questions/Concerns, Other: (directed the patient to call the office to get an appointment for post hospital follow up)   Items Reviewed: Did you receive and understand the discharge instructions provided?: Yes Medications obtained,verified, and reconciled?: Yes (Medications Reviewed) (complete reconciliation) Any new allergies since your discharge?: No Dietary orders reviewed?: Yes Type of Diet Ordered:: Heart Healthy diet Do you have support at home?: Yes People in Home: alone Name of Support/Comfort Primary Source: lives by himself, but has others who can help him if needed   Medications Reviewed Today: Medications Reviewed Today       Reviewed by Marlowe Sax, RN (Case Manager) on 11/13/22 at 1552  Med List Status: <None>    Medication Order Taking? Sig Documenting Provider Last Dose Status Informant  acetaminophen (TYLENOL) 325 MG tablet [id number] Yes Take 650 mg by mouth every 6 (six) hours as needed for mild pain. [provider] Taking Active Self  amLODipine (NORVASC) 10 MG tablet [id number] Yes Take 1 tablet (10 mg total) by mouth daily. Alford Highland, MD Taking Active    aspirin EC 81 MG tablet [id number] Yes Take 1 tablet (81 mg total) by mouth daily. Swallow whole. Alford Highland, MD Taking Active    calcium carbonate (TUMS) 500 MG chewable tablet [id number] Yes Chew 1 tablet (200 mg of elemental calcium total) by mouth 2 (two) times daily as needed for indigestion or heartburn. Joaquim Nam, MD Taking Active Self  clopidogrel (PLAVIX) 75 MG tablet [id number] Yes Take 1 tablet (75 mg total) by mouth daily for 18 days. Alford Highland, MD Taking Active    HYDROcodone-acetaminophen (NORCO/VICODIN) 5-325 MG tablet [id number] No Take 1  tablet by mouth every 8 (eight) hours as needed for severe pain.  Patient not taking: Reported on 11/13/2022   Alford Highland, MD Not Taking Active     levofloxacin (LEVAQUIN) 500 MG tablet [id number] Yes Take 1 tablet (500 mg total) by mouth daily for 26 days. Alford Highland, MD Taking Active    lisinopril (ZESTRIL) 20 MG tablet [id number] Yes Take 1 tablet (20 mg total) by mouth daily. Alford Highland, MD Taking Active    Multiple Vitamin (MULTIVITAMIN WITH MINERALS) TABS tablet [id number] Yes Take 1 tablet by mouth daily. Kathlen Mody, MD Taking Active Self  mycophenolate (CELLCEPT) 250 MG capsule [id number] Yes Take 250-500 mg by mouth 2 (two) times daily. 2 capsules (500 mg) in the morning and one capsule (250 mg) in the evening [provider] Taking Active Self  omeprazole (PRILOSEC) 20 MG capsule [id number] Yes Take 1 capsule (20 mg total) by mouth daily. Karie Schwalbe, MD Taking Active Self  polyethylene glycol (MIRALAX / GLYCOLAX) 17 g packet [id number] No Take 17 g by mouth daily as needed for mild constipation.  Patient not taking: Reported on 11/13/2022   Alford Highland, MD Not Taking Active    predniSONE (DELTASONE) 10 MG tablet [id number] Yes Take 10 mg by mouth daily with breakfast. [provider] Taking Active Self  rosuvastatin (CRESTOR) 20 MG tablet [id number] Yes Take 1 tablet (20 mg total) by mouth daily. Alford Highland, MD Taking Active    senna (SENOKOT) 8.6 MG TABS tablet [id number]   Take 2 tablets (17.2 mg total) by mouth daily. Alwyn Ren, MD   Active Self  tacrolimus (PROGRAF) 0.5 MG capsule [id number] Yes Take 0.5-1 mg by mouth See admin instructions. Taking 0.5 mg in the morning and 1 mg at bedtime [provider] Taking Active Self  tamsulosin (FLOMAX) 0.4 MG CAPS capsule [id number] Yes TAKE 1 CAPSULE(0.4 MG) BY MOUTH DAILY  Patient taking differently: Take 0.4 mg by mouth daily.   Karie Schwalbe, MD Taking Active Self                  Home Care and Equipment/Supplies: Were Home Health Services Ordered?: No (refused as he was doing well post CVA) Any new equipment or  medical supplies ordered?: No   Functional Questionnaire: Do you need assistance with bathing/showering or dressing?: No Do you need assistance with meal preparation?: No Do you need assistance with eating?: No Do you have difficulty maintaining continence: No Do you need assistance with getting out of bed/getting out of a chair/moving?: No Do you have difficulty managing or taking your medications?: No   Follow up appointments reviewed: PCP Follow-up appointment confirmed?: No MD Provider Line Number:[phone] Given: Yes Specialist Hospital Follow-up appointment confirmed?: Yes Date of Specialist follow-up appointment?: 11/18/22 Follow-Up Specialty Provider:: Dr. Cornelia Copa- liver specialist Do you need transportation to your follow-up appointment?: No Do you understand care options if your condition(s) worsen?: Yes-patient verbalized understanding       Alto Denver RN, MSN, CCM RN Care Manager  Chronic Care Management Direct Number: [phone]          Patient verbalizes understanding of instructions and care plan provided today and agrees to view in MyChart. Active MyChart status and patient understanding of how to access instructions and care plan via MyChart confirmed with patient.  Telephone follow up appointment with care management team member scheduled for: 12-12-2022

## 2022-11-13 NOTE — Transitions of Care (Post Inpatient/ED Visit) (Cosign Needed)
11/13/2022  Name: Devin White MRN: [id number] DOB: 29-Aug-1955  Today's TOC FU Call Status: Today's TOC FU Call Status:: Successful TOC FU Call Competed TOC FU Call Complete Date: 11/13/22  Transition Care Management Follow-up Telephone Call Date of Discharge: 11/11/22 Discharge Facility: [facility]) Type of Discharge: Inpatient Admission Primary Inpatient Discharge Diagnosis:: Acute CVA How have you been since you were released from the hospital?: Better Any questions or concerns?: Yes Patient Questions/Concerns:: Ask about getting a referral to see neurologist in [place] instead of [place] Patient Questions/Concerns Addressed: Provided Patient Educational Materials, Notified Provider of Patient Questions/Concerns, Other: (directed the patient to call the office to get an appointment for post hospital follow up)  Items Reviewed: Did you receive and understand the discharge instructions provided?: Yes Medications obtained,verified, and reconciled?: Yes (Medications Reviewed) (complete reconciliation) Any new allergies since your discharge?: No Dietary orders reviewed?: Yes Type of Diet Ordered:: Heart Healthy diet Do you have support at home?: Yes People in Home: alone Name of Support/Comfort Primary Source: lives by himself, but has others who can help him if needed  Medications Reviewed Today: Medications Reviewed Today     Reviewed by Marlowe Sax, RN (Case Manager) on 11/13/22 at 1552  Med List Status: <None>   Medication Order Taking? Sig Documenting Provider Last Dose Status Informant  acetaminophen (TYLENOL) 325 MG tablet [id number] Yes Take 650 mg by mouth every 6 (six) hours as needed for mild pain. [provider] Taking Active Self  amLODipine (NORVASC) 10 MG tablet [id number] Yes Take 1 tablet (10 mg total) by mouth daily. Alford Highland, MD Taking Active   aspirin EC 81 MG tablet [id number] Yes Take 1 tablet (81 mg  total) by mouth daily. Swallow whole. Alford Highland, MD Taking Active   calcium carbonate (TUMS) 500 MG chewable tablet [id number] Yes Chew 1 tablet (200 mg of elemental calcium total) by mouth 2 (two) times daily as needed for indigestion or heartburn. Joaquim Nam, MD Taking Active Self  clopidogrel (PLAVIX) 75 MG tablet [id number] Yes Take 1 tablet (75 mg total) by mouth daily for 18 days. Alford Highland, MD Taking Active   HYDROcodone-acetaminophen (NORCO/VICODIN) 5-325 MG tablet [id number] No Take 1 tablet by mouth every 8 (eight) hours as needed for severe pain.  Patient not taking: Reported on 11/13/2022   Alford Highland, MD Not Taking Active   levofloxacin (LEVAQUIN) 500 MG tablet [id number] Yes Take 1 tablet (500 mg total) by mouth daily for 26 days. Alford Highland, MD Taking Active   lisinopril (ZESTRIL) 20 MG tablet [id number] Yes Take 1 tablet (20 mg total) by mouth daily. Alford Highland, MD Taking Active   Multiple Vitamin (MULTIVITAMIN WITH MINERALS) TABS tablet [id number] Yes Take 1 tablet by mouth daily. Kathlen Mody, MD Taking Active Self  mycophenolate (CELLCEPT) 250 MG capsule [id number] Yes Take 250-500 mg by mouth 2 (two) times daily. 2 capsules (500 mg) in the morning and one capsule (250 mg) in the evening [provider] Taking Active Self  omeprazole (PRILOSEC) 20 MG capsule [id number] Yes Take 1 capsule (20 mg total) by mouth daily. Karie Schwalbe, MD Taking Active Self  polyethylene glycol (MIRALAX / GLYCOLAX) 17 g packet [id number] No Take 17 g by mouth daily as needed for mild constipation.  Patient not taking: Reported on 11/13/2022   Alford Highland, MD Not Taking Active   predniSONE (DELTASONE) 10 MG tablet [id number] Yes Take 10 mg by mouth daily with breakfast. [provider]  Taking Active Self  rosuvastatin (CRESTOR) 20 MG tablet [id number] Yes Take 1 tablet (20 mg total) by mouth daily. Alford Highland, MD Taking Active   senna (SENOKOT)  8.6 MG TABS tablet [id number]  Take 2 tablets (17.2 mg total) by mouth daily. Alwyn Ren, MD  Active Self  tacrolimus (PROGRAF) 0.5 MG capsule [id number] Yes Take 0.5-1 mg by mouth See admin instructions. Taking 0.5 mg in the morning and 1 mg at bedtime [provider] Taking Active Self  tamsulosin (FLOMAX) 0.4 MG CAPS capsule [id number] Yes TAKE 1 CAPSULE(0.4 MG) BY MOUTH DAILY  Patient taking differently: Take 0.4 mg by mouth daily.   Karie Schwalbe, MD Taking Active Self            Home Care and Equipment/Supplies: Were Home Health Services Ordered?: No (refused as he was doing well post CVA) Any new equipment or medical supplies ordered?: No  Functional Questionnaire: Do you need assistance with bathing/showering or dressing?: No Do you need assistance with meal preparation?: No Do you need assistance with eating?: No Do you have difficulty maintaining continence: No Do you need assistance with getting out of bed/getting out of a chair/moving?: No Do you have difficulty managing or taking your medications?: No  Follow up appointments reviewed: PCP Follow-up appointment confirmed?: No MD Provider Line Number:[phone] Given: Yes Specialist Hospital Follow-up appointment confirmed?: Yes Date of Specialist follow-up appointment?: 11/18/22 Follow-Up Specialty Provider:: Dr. Cornelia Copa- liver specialist Do you need transportation to your follow-up appointment?: No Do you understand care options if your condition(s) worsen?: Yes-patient verbalized understanding    Alto Denver RN, MSN, CCM RN Care Manager  Chronic Care Management Direct Number: [phone]

## 2022-11-15 ENCOUNTER — Observation Stay
Admission: EM | Admit: 2022-11-15 | Discharge: 2022-11-17 | Disposition: A | Discharge status: Home / Self Care | Payer: 59 | Attending: Internal Medicine | Admitting: Internal Medicine

## 2022-11-15 ENCOUNTER — Emergency Department: Payer: 59

## 2022-11-15 ENCOUNTER — Other Ambulatory Visit: Payer: Self-pay

## 2022-11-15 DIAGNOSIS — Z7902 Long term (current) use of antithrombotics/antiplatelets: Secondary | ICD-10-CM | POA: Insufficient documentation

## 2022-11-15 DIAGNOSIS — T7840XA Allergy, unspecified, initial encounter: Secondary | ICD-10-CM | POA: Diagnosis not present

## 2022-11-15 DIAGNOSIS — Z86718 Personal history of other venous thrombosis and embolism: Secondary | ICD-10-CM | POA: Diagnosis not present

## 2022-11-15 DIAGNOSIS — R42 Dizziness and giddiness: Secondary | ICD-10-CM | POA: Diagnosis not present

## 2022-11-15 DIAGNOSIS — I5032 Chronic diastolic (congestive) heart failure: Secondary | ICD-10-CM | POA: Diagnosis present

## 2022-11-15 DIAGNOSIS — Z7722 Contact with and (suspected) exposure to environmental tobacco smoke (acute) (chronic): Secondary | ICD-10-CM | POA: Insufficient documentation

## 2022-11-15 DIAGNOSIS — I130 Hypertensive heart and chronic kidney disease with heart failure and stage 1 through stage 4 chronic kidney disease, or unspecified chronic kidney disease: Secondary | ICD-10-CM | POA: Insufficient documentation

## 2022-11-15 DIAGNOSIS — Z944 Liver transplant status: Secondary | ICD-10-CM | POA: Diagnosis not present

## 2022-11-15 DIAGNOSIS — N410 Acute prostatitis: Secondary | ICD-10-CM | POA: Diagnosis not present

## 2022-11-15 DIAGNOSIS — E860 Dehydration: Secondary | ICD-10-CM | POA: Diagnosis not present

## 2022-11-15 DIAGNOSIS — N179 Acute kidney failure, unspecified: Secondary | ICD-10-CM | POA: Diagnosis not present

## 2022-11-15 DIAGNOSIS — Z79899 Other long term (current) drug therapy: Secondary | ICD-10-CM | POA: Insufficient documentation

## 2022-11-15 DIAGNOSIS — N1831 Acute kidney failure, unspecified: Secondary | ICD-10-CM | POA: Diagnosis present

## 2022-11-15 DIAGNOSIS — R7989 Other specified abnormal findings of blood chemistry: Secondary | ICD-10-CM | POA: Insufficient documentation

## 2022-11-15 DIAGNOSIS — Z7982 Long term (current) use of aspirin: Secondary | ICD-10-CM | POA: Insufficient documentation

## 2022-11-15 DIAGNOSIS — I639 Cerebral infarction, unspecified: Secondary | ICD-10-CM

## 2022-11-15 DIAGNOSIS — R079 Chest pain, unspecified: Secondary | ICD-10-CM | POA: Diagnosis not present

## 2022-11-15 DIAGNOSIS — I10 Essential (primary) hypertension: Secondary | ICD-10-CM

## 2022-11-15 DIAGNOSIS — R1011 Right upper quadrant pain: Secondary | ICD-10-CM | POA: Diagnosis not present

## 2022-11-15 DIAGNOSIS — Z7960 Long term (current) use of unspecified immunomodulators and immunosuppressants: Secondary | ICD-10-CM

## 2022-11-15 DIAGNOSIS — I700 Atherosclerosis of aorta: Secondary | ICD-10-CM | POA: Diagnosis not present

## 2022-11-15 DIAGNOSIS — Z9049 Acquired absence of other specified parts of digestive tract: Secondary | ICD-10-CM | POA: Diagnosis not present

## 2022-11-15 DIAGNOSIS — R55 Syncope and collapse: Secondary | ICD-10-CM

## 2022-11-15 DIAGNOSIS — Z8673 Personal history of transient ischemic attack (TIA), and cerebral infarction without residual deficits: Secondary | ICD-10-CM | POA: Diagnosis not present

## 2022-11-15 DIAGNOSIS — E669 Obesity, unspecified: Secondary | ICD-10-CM | POA: Diagnosis present

## 2022-11-15 DIAGNOSIS — R252 Cramp and spasm: Secondary | ICD-10-CM | POA: Diagnosis present

## 2022-11-15 LAB — URINALYSIS, W/ REFLEX TO CULTURE (INFECTION SUSPECTED)
Bacteria, UA: NONE SEEN
Bilirubin Urine: NEGATIVE
Glucose, UA: NEGATIVE mg/dL
Hgb urine dipstick: NEGATIVE
Ketones, ur: NEGATIVE mg/dL
Leukocytes,Ua: NEGATIVE
Nitrite: NEGATIVE
Protein, ur: NEGATIVE mg/dL
Specific Gravity, Urine: 1.01 (ref 1.005–1.030)
Squamous Epithelial / HPF: NONE SEEN /HPF (ref 0–5)
pH: 5 (ref 5.0–8.0)

## 2022-11-15 LAB — CBC
HCT: 49.1 % (ref 39.0–52.0)
Hemoglobin: 16.3 g/dL (ref 13.0–17.0)
MCH: 31.3 pg (ref 26.0–34.0)
MCHC: 33.2 g/dL (ref 30.0–36.0)
MCV: 94.4 fL (ref 80.0–100.0)
Platelets: 200 10*3/uL (ref 150–400)
RBC: 5.2 MIL/uL (ref 4.22–5.81)
RDW: 13.9 % (ref 11.5–15.5)
WBC: 14.2 10*3/uL — ABNORMAL HIGH (ref 4.0–10.5)
nRBC: 0 % (ref 0.0–0.2)

## 2022-11-15 LAB — BASIC METABOLIC PANEL
Anion gap: 10 (ref 5–15)
BUN: 27 mg/dL — ABNORMAL HIGH (ref 8–23)
CO2: 24 mmol/L (ref 22–32)
Calcium: 9 mg/dL (ref 8.9–10.3)
Chloride: 103 mmol/L (ref 98–111)
Creatinine, Ser: 2.03 mg/dL — ABNORMAL HIGH (ref 0.61–1.24)
GFR, Estimated: 36 mL/min — ABNORMAL LOW (ref >60)
Glucose, Bld: 204 mg/dL — ABNORMAL HIGH (ref 70–99)
Potassium: 4.9 mmol/L (ref 3.5–5.1)
Sodium: 137 mmol/L (ref 135–145)

## 2022-11-15 LAB — TROPONIN I (HIGH SENSITIVITY)
Troponin I (High Sensitivity): 24 ng/L — ABNORMAL HIGH (ref <18)
Troponin I (High Sensitivity): 24 ng/L — ABNORMAL HIGH (ref <18)

## 2022-11-15 MED ORDER — SODIUM CHLORIDE 0.9 % IV BOLUS
1000.0000 mL | Freq: Once | INTRAVENOUS | Status: AC
  Administered 2022-11-15: 1000 mL via INTRAVENOUS

## 2022-11-15 NOTE — ED Triage Notes (Signed)
Pt to ED from home for possible allergic reaction to the antibody medicine that he was recently started. Pt was just DC from here. Pt is caox4.

## 2022-11-15 NOTE — Discharge Instructions (Signed)
Please discontinue taking your antibiotic

## 2022-11-15 NOTE — ED Provider Notes (Signed)
[facility] Provider Note   Event Date/Time   First MD Initiated Contact with Patient 11/15/22 2037     (approximate) History  Allergic Reaction  HPI Devin White is a 67 y.o. male with stated past medical history of liver transplant who presents complaining of bilateral lower extremity cramping, fever, generalized weakness over the past 2 days after starting a new antibiotic.  Patient states that he was told to take this antibiotic for 26 days but to report any symptoms such as cramping to the physician as well as come to the emergency department if symptoms get worse.  Patient states that he is concerned that he may be having an allergic reaction to this new antibiotic ROS: Patient currently denies any vision changes, tinnitus, difficulty speaking, facial droop, sore throat, chest pain, shortness of breath, abdominal pain, nausea/vomiting/diarrhea, dysuria, or numbness/paresthesias in any extremity   Physical Exam  Triage Vital Signs: ED Triage Vitals  Enc Vitals Group     BP 11/15/22 1850 (!) 141/117     Pulse Rate 11/15/22 1850 99     Resp 11/15/22 1850 20     Temp 11/15/22 1850 98 F (36.7 C)     Temp Source 11/15/22 1850 Oral     SpO2 11/15/22 1850 98 %     Weight 11/15/22 1847 262 lb 5.6 oz (119 kg)     Height 11/15/22 1847 6' (1.829 m)     Head Circumference --      Peak Flow --      Pain Score 11/15/22 1847 8     Pain Loc --      Pain Edu? --      Excl. in GC? --    Most recent vital signs: Vitals:   11/16/22 2200 11/16/22 2300  BP: 121/75 (!) 144/87  Pulse: 79 75  Resp: 20 14  Temp:    SpO2: 94% 95%   General: Awake, oriented x4. CV:  Good peripheral perfusion.  Resp:  Normal effort.  Abd:  No distention.  Mild tenderness to palpation of the right upper quadrant Other:  Elderly overweight Caucasian male laying in bed in no acute distress ED Results / Procedures / Treatments  Labs (all labs ordered are listed, but only abnormal  results are displayed) Labs Reviewed  BASIC METABOLIC PANEL - Abnormal; Notable for the following components:      Result Value   Glucose, Bld 204 (*)    BUN 27 (*)    Creatinine, Ser 2.03 (*)    GFR, Estimated 36 (*)    All other components within normal limits  CBC - Abnormal; Notable for the following components:   WBC 14.2 (*)    All other components within normal limits  URINALYSIS, W/ REFLEX TO CULTURE (INFECTION SUSPECTED) - Abnormal; Notable for the following components:   Color, Urine YELLOW (*)    APPearance CLEAR (*)    All other components within normal limits  CBC - Abnormal; Notable for the following components:   Platelets 139 (*)    All other components within normal limits  CREATININE, SERUM - Abnormal; Notable for the following components:   Creatinine, Ser 1.56 (*)    GFR, Estimated 49 (*)    All other components within normal limits  TSH - Abnormal; Notable for the following components:   TSH 8.048 (*)    All other components within normal limits  TROPONIN I (HIGH SENSITIVITY) - Abnormal; Notable for the following components:  Troponin I (High Sensitivity) 24 (*)    All other components within normal limits  TROPONIN I (HIGH SENSITIVITY) - Abnormal; Notable for the following components:   Troponin I (High Sensitivity) 24 (*)    All other components within normal limits  D-DIMER, QUANTITATIVE   EKG ED ECG REPORT I, Merwyn Katos, the attending physician, personally viewed and interpreted this ECG. Date: 11/15/2022 EKG Time: 1853 Rate: 101 Rhythm: Tachycardic sinus rhythm QRS Axis: normal Intervals: normal ST/T Wave abnormalities: normal Narrative Interpretation: no evidence of acute ischemia RADIOLOGY ED MD interpretation: 2 view chest x-ray interpreted independently by me shows mild enlargement of the cardiopericardial silhouette without edema -Agree with radiology assessment Official radiology report(s): US Venous Img Lower Bilateral  (DVT)  Result Date: 11/16/2022 CLINICAL DATA:  Syncope Leg pain for 2 days EXAM: Bilateral lower Extremity Venous Doppler Ultrasound TECHNIQUE: Gray-scale sonography with compression, as well as color and duplex ultrasound, were performed to evaluate the deep venous system(s) from the level of the common femoral vein through the popliteal and proximal calf veins. COMPARISON:  None available FINDINGS: VENOUS Normal compressibility of the common femoral, superficial femoral, and popliteal veins, as well as the visualized calf veins. Visualized portions of profunda femoral vein and great saphenous vein unremarkable. No filling defects to suggest DVT on grayscale or color Doppler imaging. Doppler waveforms show normal direction of venous flow, normal respiratory plasticity and response to augmentation. OTHER None. Limitations: none IMPRESSION: No  lower extremity DVT. Electronically Signed   By: Acquanetta Belling M.D.   On: 11/16/2022 08:13   PROCEDURES: Critical Care performed: No Procedures MEDICATIONS ORDERED IN ED: Medications  senna (SENOKOT) tablet 17.2 mg (17.2 mg Oral Given 11/16/22 0932)  aspirin EC tablet 81 mg (81 mg Oral Given 11/16/22 0935)  amLODipine (NORVASC) tablet 10 mg (10 mg Oral Given 11/16/22 0932)  lisinopril (ZESTRIL) tablet 20 mg (20 mg Oral Given 11/16/22 0933)  rosuvastatin (CRESTOR) tablet 20 mg (20 mg Oral Given 11/16/22 0932)  calcium carbonate (TUMS - dosed in mg elemental calcium) chewable tablet 200 mg of elemental calcium (has no administration in time range)  pantoprazole (PROTONIX) EC tablet 40 mg (40 mg Oral Given 11/16/22 0933)  tamsulosin (FLOMAX) capsule 0.4 mg (0.4 mg Oral Given 11/16/22 0936)  clopidogrel (PLAVIX) tablet 75 mg (75 mg Oral Given 11/16/22 0933)  multivitamin with minerals tablet 1 tablet (1 tablet Oral Given 11/16/22 0932)  sodium chloride flush (NS) 0.9 % injection 3 mL (3 mLs Intravenous Given 11/16/22 2256)  enoxaparin (LOVENOX) injection 60 mg (60 mg Subcutaneous  Given 11/16/22 0940)  acetaminophen (TYLENOL) tablet 650 mg (650 mg Oral Given 11/16/22 0936)    Or  acetaminophen (TYLENOL) suppository 650 mg ( Rectal See Alternative 11/16/22 0936)  ondansetron (ZOFRAN) tablet 4 mg (4 mg Oral Given 11/16/22 0935)    Or  ondansetron (ZOFRAN) injection 4 mg ( Intravenous See Alternative 11/16/22 0935)  mycophenolate (CELLCEPT) capsule 500 mg (500 mg Oral Given 11/16/22 0934)    And  mycophenolate (CELLCEPT) capsule 250 mg (250 mg Oral Given 11/16/22 2253)  tacrolimus (PROGRAF) capsule 0.5 mg (0.5 mg Oral Given 11/16/22 0933)    And  tacrolimus (PROGRAF) capsule 1 mg (1 mg Oral Given 11/16/22 2255)  lidocaine (XYLOCAINE) 2 % viscous mouth solution 15 mL (15 mLs Mouth/Throat Not Given 11/16/22 1313)  sulfamethoxazole-trimethoprim (BACTRIM DS) 800-160 MG per tablet 1 tablet (has no administration in time range)  sodium chloride 0.9 % bolus 1,000 mL (0 mLs Intravenous  Stopped 11/16/22 0129)  sodium chloride 0.9 % bolus 1,000 mL (0 mLs Intravenous Stopped 11/16/22 0129)  sodium chloride 0.9 % bolus 500 mL (0 mLs Intravenous Stopped 11/16/22 0931)  alum & mag hydroxide-simeth (MAALOX/MYLANTA) 200-200-20 MG/5ML suspension 30 mL (30 mLs Oral Given 11/16/22 1313)   IMPRESSION / MDM / ASSESSMENT AND PLAN / ED COURSE  I reviewed the triage vital signs and the nursing notes.                             The patient is on the cardiac monitor to evaluate for evidence of arrhythmia and/or significant heart rate changes. Patient's presentation is most consistent with acute presentation with potential threat to life or bodily function. Generalized weakness, new medication started recently.  Concern for possible allergic reaction No evidence of multiorgan involvement  Given history and exam, presentation most consistent with allergic reaction. I have low suspicion for toxic shock syndrome, anaphylaxis, asthma exacerbation, or drug toxicity.  Care of this patient will be signed out to the oncoming  physician at the end of my shift.  All pertinent patient information conveyed and all questions answered.  All further care and disposition decisions will be made by the oncoming physician.   FINAL CLINICAL IMPRESSION(S) / ED DIAGNOSES   Final diagnoses:  Allergic reaction, initial encounter  Dehydration   Rx / DC Orders   ED Discharge Orders     None      Note:  This document was prepared using Dragon voice recognition software and may include unintentional dictation errors.   Merwyn Katos, MD 11/16/22 2350

## 2022-11-15 NOTE — ED Notes (Signed)
Clarified order of 2 Liters of sodium chloride to be given as ordered

## 2022-11-16 ENCOUNTER — Observation Stay: Payer: 59

## 2022-11-16 DIAGNOSIS — R55 Syncope and collapse: Secondary | ICD-10-CM | POA: Diagnosis not present

## 2022-11-16 DIAGNOSIS — Z86718 Personal history of other venous thrombosis and embolism: Secondary | ICD-10-CM

## 2022-11-16 DIAGNOSIS — M79662 Pain in left lower leg: Secondary | ICD-10-CM | POA: Diagnosis not present

## 2022-11-16 DIAGNOSIS — R42 Dizziness and giddiness: Secondary | ICD-10-CM | POA: Diagnosis not present

## 2022-11-16 DIAGNOSIS — Z8673 Personal history of transient ischemic attack (TIA), and cerebral infarction without residual deficits: Secondary | ICD-10-CM

## 2022-11-16 LAB — CREATININE, SERUM
Creatinine, Ser: 1.56 mg/dL — ABNORMAL HIGH (ref 0.61–1.24)
GFR, Estimated: 49 mL/min — ABNORMAL LOW (ref >60)

## 2022-11-16 LAB — CBC
HCT: 40.1 % (ref 39.0–52.0)
Hemoglobin: 13.6 g/dL (ref 13.0–17.0)
MCH: 31.9 pg (ref 26.0–34.0)
MCHC: 33.9 g/dL (ref 30.0–36.0)
MCV: 93.9 fL (ref 80.0–100.0)
Platelets: 139 10*3/uL — ABNORMAL LOW (ref 150–400)
RBC: 4.27 MIL/uL (ref 4.22–5.81)
RDW: 14.1 % (ref 11.5–15.5)
WBC: 8.9 10*3/uL (ref 4.0–10.5)
nRBC: 0 % (ref 0.0–0.2)

## 2022-11-16 LAB — TSH: TSH: 8.048 u[IU]/mL — ABNORMAL HIGH (ref 0.350–4.500)

## 2022-11-16 LAB — D-DIMER, QUANTITATIVE: D-Dimer, Quant: 0.46 ug/mL-FEU (ref 0.00–0.50)

## 2022-11-16 MED ORDER — ADULT MULTIVITAMIN W/MINERALS CH
1.0000 | ORAL_TABLET | Freq: Every day | ORAL | Status: DC
  Administered 2022-11-16 – 2022-11-17 (×2): 1 via ORAL
  Filled 2022-11-16 (×2): qty 1

## 2022-11-16 MED ORDER — ACETAMINOPHEN 650 MG RE SUPP: 650.0000 mg | Freq: Four times a day (QID) | RECTAL | Status: DC | PRN

## 2022-11-16 MED ORDER — ALUM & MAG HYDROXIDE-SIMETH 200-200-20 MG/5ML PO SUSP
30.0000 mL | Freq: Once | ORAL | Status: AC
  Administered 2022-11-16: 30 mL via ORAL

## 2022-11-16 MED ORDER — LISINOPRIL 10 MG PO TABS
20.0000 mg | ORAL_TABLET | Freq: Every day | ORAL | Status: DC
  Administered 2022-11-16 – 2022-11-17 (×2): 20 mg via ORAL
  Filled 2022-11-16 (×2): qty 2

## 2022-11-16 MED ORDER — SODIUM CHLORIDE 0.9 % IV BOLUS
500.0000 mL | Freq: Once | INTRAVENOUS | Status: AC
  Administered 2022-11-16: 500 mL via INTRAVENOUS

## 2022-11-16 MED ORDER — CALCIUM CARBONATE ANTACID 500 MG PO CHEW: 1.0000 | CHEWABLE_TABLET | Freq: Two times a day (BID) | ORAL | Status: DC | PRN

## 2022-11-16 MED ORDER — PANTOPRAZOLE SODIUM 40 MG PO TBEC
40.0000 mg | DELAYED_RELEASE_TABLET | Freq: Every day | ORAL | Status: DC
  Administered 2022-11-16 – 2022-11-17 (×2): 40 mg via ORAL
  Filled 2022-11-16 (×2): qty 1

## 2022-11-16 MED ORDER — ENOXAPARIN SODIUM 60 MG/0.6ML IJ SOSY
60.0000 mg | PREFILLED_SYRINGE | INTRAMUSCULAR | Status: DC
  Administered 2022-11-16 – 2022-11-17 (×2): 60 mg via SUBCUTANEOUS
  Filled 2022-11-16 (×2): qty 0.6

## 2022-11-16 MED ORDER — ACETAMINOPHEN 325 MG PO TABS: 650.0000 mg | ORAL_TABLET | Freq: Four times a day (QID) | ORAL | Status: DC | PRN

## 2022-11-16 MED ORDER — TACROLIMUS 0.5 MG PO CAPS
0.5000 mg | ORAL_CAPSULE | Freq: Every day | ORAL | Status: DC
  Administered 2022-11-16 – 2022-11-17 (×2): 0.5 mg via ORAL
  Filled 2022-11-16 (×2): qty 1

## 2022-11-16 MED ORDER — ONDANSETRON HCL 4 MG/2ML IJ SOLN: 4.0000 mg | Freq: Four times a day (QID) | INTRAMUSCULAR | Status: DC | PRN

## 2022-11-16 MED ORDER — MYCOPHENOLATE MOFETIL 250 MG PO CAPS: 250.0000 mg | ORAL_CAPSULE | Freq: Two times a day (BID) | ORAL | Status: DC

## 2022-11-16 MED ORDER — TACROLIMUS 1 MG PO CAPS
1.0000 mg | ORAL_CAPSULE | Freq: Every day | ORAL | Status: DC
  Administered 2022-11-16: 1 mg via ORAL
  Filled 2022-11-16 (×2): qty 1

## 2022-11-16 MED ORDER — TACROLIMUS 0.5 MG PO CAPS: 0.5000 mg | ORAL_CAPSULE | ORAL | Status: DC

## 2022-11-16 MED ORDER — ONDANSETRON HCL 4 MG PO TABS
4.0000 mg | ORAL_TABLET | Freq: Four times a day (QID) | ORAL | Status: DC | PRN
  Administered 2022-11-16: 4 mg via ORAL
  Filled 2022-11-16: qty 1

## 2022-11-16 MED ORDER — LEVOFLOXACIN 500 MG PO TABS
500.0000 mg | ORAL_TABLET | Freq: Every day | ORAL | Status: DC
  Administered 2022-11-16: 500 mg via ORAL
  Filled 2022-11-16: qty 1

## 2022-11-16 MED ORDER — TAMSULOSIN HCL 0.4 MG PO CAPS
0.4000 mg | ORAL_CAPSULE | Freq: Every day | ORAL | Status: DC
  Administered 2022-11-16 – 2022-11-17 (×2): 0.4 mg via ORAL
  Filled 2022-11-16 (×2): qty 1

## 2022-11-16 MED ORDER — ROSUVASTATIN CALCIUM 20 MG PO TABS
20.0000 mg | ORAL_TABLET | Freq: Every day | ORAL | Status: DC
  Administered 2022-11-16 – 2022-11-17 (×2): 20 mg via ORAL
  Filled 2022-11-16 (×2): qty 1

## 2022-11-16 MED ORDER — SODIUM CHLORIDE 0.9% FLUSH
3.0000 mL | Freq: Two times a day (BID) | INTRAVENOUS | Status: DC
  Administered 2022-11-16 – 2022-11-17 (×3): 3 mL via INTRAVENOUS

## 2022-11-16 MED ORDER — CLOPIDOGREL BISULFATE 75 MG PO TABS
75.0000 mg | ORAL_TABLET | Freq: Every day | ORAL | Status: DC
  Administered 2022-11-16 – 2022-11-17 (×2): 75 mg via ORAL
  Filled 2022-11-16 (×2): qty 1

## 2022-11-16 MED ORDER — SENNA 8.6 MG PO TABS
2.0000 | ORAL_TABLET | Freq: Every day | ORAL | Status: DC
  Administered 2022-11-16 – 2022-11-17 (×2): 17.2 mg via ORAL
  Filled 2022-11-16 (×2): qty 2

## 2022-11-16 MED ORDER — MYCOPHENOLATE MOFETIL 250 MG PO CAPS
250.0000 mg | ORAL_CAPSULE | Freq: Every day | ORAL | Status: DC
  Administered 2022-11-16: 250 mg via ORAL
  Filled 2022-11-16 (×2): qty 1

## 2022-11-16 MED ORDER — ASPIRIN 81 MG PO TBEC
81.0000 mg | DELAYED_RELEASE_TABLET | Freq: Every day | ORAL | Status: DC
  Administered 2022-11-16 – 2022-11-17 (×2): 81 mg via ORAL
  Filled 2022-11-16 (×2): qty 1

## 2022-11-16 MED ORDER — LIDOCAINE VISCOUS HCL 2 % MT SOLN
15.0000 mL | Freq: Once | OROMUCOSAL | Status: DC
  Filled 2022-11-16: qty 15

## 2022-11-16 MED ORDER — ACETAMINOPHEN 325 MG PO TABS
650.0000 mg | ORAL_TABLET | Freq: Four times a day (QID) | ORAL | Status: DC | PRN
  Administered 2022-11-16: 650 mg via ORAL
  Filled 2022-11-16: qty 2

## 2022-11-16 MED ORDER — SULFAMETHOXAZOLE-TRIMETHOPRIM 800-160 MG PO TABS
1.0000 | ORAL_TABLET | Freq: Two times a day (BID) | ORAL | Status: DC
  Administered 2022-11-17: 1 via ORAL
  Filled 2022-11-16: qty 1

## 2022-11-16 MED ORDER — MYCOPHENOLATE MOFETIL 250 MG PO CAPS
500.0000 mg | ORAL_CAPSULE | Freq: Every day | ORAL | Status: DC
  Administered 2022-11-16 – 2022-11-17 (×2): 500 mg via ORAL
  Filled 2022-11-16 (×2): qty 2

## 2022-11-16 MED ORDER — AMLODIPINE BESYLATE 5 MG PO TABS
10.0000 mg | ORAL_TABLET | Freq: Every day | ORAL | Status: DC
  Administered 2022-11-16 – 2022-11-17 (×2): 10 mg via ORAL
  Filled 2022-11-16 (×2): qty 2

## 2022-11-16 NOTE — ED Notes (Signed)
pt c/o and has a prominent, swollen, TTP Left parotid area. Admitting MD messaged.

## 2022-11-16 NOTE — Hospital Course (Addendum)
Taken from H&P.   Devin White is a 67 y.o. male with medical history significant for cirrhosis s/p liver transplant on chronic immunosuppressive therapy (tacrolimus and mycophenolate mofetil), frequent UTI on suppressive antibiotics, gout on chronic prednisone, HFpEF, provoked superior mesenteric vein thrombosis in 2022, (on Eliquis 11/2020-05/2021) , CKD 3a, hypertension, among other medical problems, recently hospitalized from 5/21 to 11/11/2022 for an acute CVA treated with TNK with negative MRI, with stay complicated by acute bacterial prostatitis, discharged on prolonged course of Levaquin, who presents to the ED due to  weakness and chest pain associated with leg cramping.  His chest pain got worse while going to supermarket, called his PCP and they advised him to come to ED.  Patient is on a prolonged course of fluoroquinolone until 6/23 due to recent bacterial prostatitis.  ED course and data review: BP 141/117 with otherwise normal vitals Labs notable for leukocytosis of 14,000, creatinine of 2.03 up from baseline of 1.38, blood glucose 204.  Troponin 24>.24.  Urinalysis unremarkable.  EKG, showing sinus tachycardia at 101 with no concerning ST-T wave changes. Chest x-ray compared to recent on 5/21, shows mild enlargement of the cardiopericardial silhouette without edema. Right upper quadrant ultrasound showing possible steatosis, postcholecystectomy and no biliary dilatation.  6/1: Vital stable.  Lower extremity venous Doppler negative for DVT.  Continue to have calf pain.  Having burning substernal pain which seems more like GERD. Switching levofloxacin with Bactrim.  Also ordered GI cocktail.  Leukocytosis resolved and renal function seems improving.  6/2: Hemodynamically stable.  TSH elevated at 8.048, rest of the thyroid profile ordered.  Patient's excessive fatigue either can be due to hypothyroidism or sleep apnea as he is obese.  There was also some concern of mild desaturation  while sleeping which improved with 2 L of oxygen.  Patient needs outpatient sleep study for further management.  Discussed with patient to either wait for the rest of the labs or start thyroid replacement.  Decided that can be done by PCP and he will have his rest of the thyroid panel available to review.  We switched his Levaquin with Bactrim for his prostatitis due to his complaint of calf pain which can be due to Levaquin.  No leg pain before discharge.  He need to follow-up with urology closely for further recommendations.  Patient also desaturated on ambulation which can also be contributory to his fatigue.  Requiring 2 L of oxygen which was ordered.  His PCP should be able to monitor and titrate.  He will still need a sleep study to diagnose and manage his sleep apnea.  Patient will continue the rest of his home medications and need to have a close follow-up with his providers for further recommendations.

## 2022-11-16 NOTE — Assessment & Plan Note (Signed)
Leg pain Patient was on Eliquis 12/04/2020 to 06/05/2021 Follow bilateral lower extremity venous ultrasound due to complaints of leg pain----> D-dimer was negative

## 2022-11-16 NOTE — ED Provider Notes (Signed)
-----------------------------------------   12:35 AM on 11/16/2022 ----------------------------------------- Patient was to be discharged however upon attempting to be discharged patient states he cannot walk and still feels very weak and continues to have chest pain.  I reviewed the patient's recent discharge summary from 1 week ago at which time the patient was admitted for urinary tract infection ultimately had a CVA and was discharged on prolonged flora quinolones for likely prostatitis.  Patient states over the last few days he has had worsening leg pain cramping and today had weakness states he nearly passed out several times and has been experiencing chest pain and shortness of breath.  He called his doctor who referred him to the emergency department.  Patient's workup in the emergency department does not appear to show any significant findings, CBC shows mild leukocytosis which has trended up since discharge.  Patient's chemistry shows renal insufficiency baseline creatinine around 1.5 at discharge currently 2.0.  Urinalysis does not appear to show any active infection.  Troponin was negative x 2..  Ultrasound showed no concerning findings.  Chest x-ray shows no significant findings either.  However given the patient's continued weakness continued shortness of breath and chest pain and feels he cannot go home in his current state we will discuss with the hospitalist to admit the patient for further workup and treatment.   Minna Antis, MD 11/16/22 [phone]

## 2022-11-16 NOTE — ED Notes (Signed)
Admitting MD at [facility]. Pt mentions back pain, leg pain and CP. GI cocktail ordered.

## 2022-11-16 NOTE — Assessment & Plan Note (Addendum)
Recent CVA 11/05/2022 treated with TNK negative MRI Continue aspirin.  Plavix to continue until 6/15 Continue Crestor

## 2022-11-16 NOTE — Assessment & Plan Note (Addendum)
Etiology uncertain, possibly related to dehydration given AKI Had an unremarkable echo on 5/23 with EF 60 to 65% Continuous cardiac monitoring Hydrate and evaluate Orthostatic vital signs

## 2022-11-16 NOTE — ED Notes (Signed)
Sleeping, arousable to entering room. Mentions some continued tightness in epigastrum, stomach, and tightness with swelling in toes. Urinal emptied.

## 2022-11-16 NOTE — Assessment & Plan Note (Addendum)
Patient was on long-term fluoroquinolone for another 3 weeks until 6/23 Urinalysis unremarkable He denies any prostate discomfort Worsening bilateral calf pain which can be due to levofloxacin so we will discontinue fluoroquinolone and switching him to Bactrim. -Need to follow-up with urology as outpatient

## 2022-11-16 NOTE — Assessment & Plan Note (Addendum)
Creatinine 2.03 up from baseline of 1.38 on admission, started improving with IV fluid. Hydrate and monitor Avoid nephrotoxins

## 2022-11-16 NOTE — Progress Notes (Signed)
PHARMACIST - PHYSICIAN COMMUNICATION  CONCERNING:  Enoxaparin (Lovenox) for DVT Prophylaxis    RECOMMENDATION: Patient was prescribed enoxaprin 40mg  q24 hours for VTE prophylaxis.   Filed Weights   11/15/22 1847  Weight: 119 kg (262 lb 5.6 oz)    Body mass index is 35.58 kg/m.  Estimated Creatinine Clearance: 49 mL/min (A) (by C-G formula based on SCr of 2.03 mg/dL (H)).   Based on [facility] policy patient is candidate for enoxaparin 0.5mg /kg TBW SQ every 24 hours based on BMI being >30.  DESCRIPTION: Pharmacy has adjusted enoxaparin dose per [facility] policy.  Patient is now receiving enoxaparin 0.5 mg/kg every 24 hours   Otelia Sergeant, PharmD, [facility] 11/16/2022 6:15 AM

## 2022-11-16 NOTE — H&P (Addendum)
History and Physical    Patient: Devin White WGN:[id number] DOB: 1956-01-27 DOA: 11/15/2022 DOS: the patient was seen and examined on 11/16/2022 PCP: Karie Schwalbe, MD  Patient coming from: Home  Chief Complaint:  Chief Complaint  Patient presents with   Allergic Reaction    HPI: Devin White is a 67 y.o. male with medical history significant for cirrhosis s/p liver transplant on chronic immunosuppressive therapy (tacrolimus and mycophenolate mofetil), frequent UTI on suppressive antibiotics, gout on chronic prednisone, HFpEF, provoked superior mesenteric vein thrombosis in 2022, (on Eliquis 11/2020-05/2021) , CKD 3a, hypertension, among other medical problems, recently hospitalized from 5/21 to 11/11/2022 for an acute CVA treated with TNK with negative MRI, with stay complicated by acute bacterial prostatitis, discharged on prolonged course of Levaquin, who presents to the ED due to concerns for possible allergic reaction to the antibiotic.  His complaint is mainly weakness and chest pain associated with leg cramping.  On the day of arrival he went to the supermarket and states the chest pain got worse and he felt like he was going to pass out and he had to lean onto the shopping cart for support.  Marland Kitchen  He called his doctor who recommended that he return to the ED.  He denies cough fever chills or shortness of breath.  Patient denies headache, visual disturbance or one-sided weakness numbness or tingling.  He reports the leg pain is to the back of his legs starting on the right leg and then it is now on both legs. ED course and data review: BP 141/117 with otherwise normal vitals Labs notable for leukocytosis of 14,000, creatinine of 2.03 up from baseline of 1.38, blood glucose 204.  Troponin 24.  Urinalysis unremarkable.  EKG, personally viewed and interpreted showing sinus tachycardia at 101 with no concerning ST-T wave changes. Chest x-ray compared to recent on 5/21, shows mild  enlargement of the cardiopericardial silhouette without edema. Right upper quadrant ultrasound showing possible steatosis, postcholecystectomy and no biliary dilatation. Patient treated with an IV fluid bolus Hospitalist consulted for admission.   Review of Systems: As mentioned in the history of present illness. All other systems reviewed and are negative.  Past Medical History:  Diagnosis Date   Arthritis    back and legs   Benign prostatic hypertrophy    CHF (congestive heart failure) (HCC)    GERD (gastroesophageal reflux disease)    Gout    History of blood transfusion    pt has antibodies in his blood since previous transfusions   History of cirrhosis of liver S/P TRANSPLANT 2009   History of liver failure S/P TRANSPLANT   Hypertension    Left ureteral calculus    Renal disorder    Stroke [facility])    Past Surgical History:  Procedure Laterality Date   APPENDECTOMY     bone morrow biopsy     CHOLECYSTECTOMY  2007   CYSTO/ LEFT RETROGRADE PYELOGRAM/ LEFT URETERAL STENT PLACEMENT  03-05-2012  DR Berneice Heinrich [facility])   LEFT URETERAL CALCULI   CYSTOSCOPY W/ URETERAL STENT PLACEMENT  03/11/2012   Procedure: CYSTOSCOPY WITH STENT REPLACEMENT;  Surgeon: Sebastian Ache, MD;  Location: [facility];  Service: Urology;  Laterality: Left;   HERNIA REPAIR  2008   LIVER BIOPSY     LIVER TRANSPLANT  11/24/2007   pt states doing well since liver transplant   LUMBAR DISC SURGERY     LUMBAR FUSION     removal of fistula  URETEROSCOPY  03/11/2012   Procedure: URETEROSCOPY;  Surgeon: Sebastian Ache, MD;  Location: [facility];  Service: Urology;  Laterality: Left;   STONE MANIPULATION, stone obtained  [phone] [facility]   Social History:  reports that he has never smoked. He has been exposed to tobacco smoke. He has never used smokeless tobacco. He reports that he does not drink alcohol and does not use drugs.  No Known Allergies  Family History  Problem Relation  Age of Onset   Cancer Mother        colon   Arthritis Mother    Vasculitis Mother    Hypertension Mother    Cancer Father        colon   Kidney disease Father    Arthritis Father    Arthritis Brother    Alcohol abuse Maternal Aunt    Diabetes Maternal Aunt    Alcohol abuse Maternal Uncle    Diabetes Paternal Aunt    Alcohol abuse Maternal Uncle    Alcohol abuse Maternal Uncle     Prior to Admission medications   Medication Sig Start Date End Date Taking? Authorizing Provider  acetaminophen (TYLENOL) 325 MG tablet Take 650 mg by mouth every 6 (six) hours as needed for mild pain.    [provider]  amLODipine (NORVASC) 10 MG tablet Take 1 tablet (10 mg total) by mouth daily. 11/12/22   Alford Highland, MD  aspirin EC 81 MG tablet Take 1 tablet (81 mg total) by mouth daily. Swallow whole. 11/12/22   Alford Highland, MD  calcium carbonate (TUMS) 500 MG chewable tablet Chew 1 tablet (200 mg of elemental calcium total) by mouth 2 (two) times daily as needed for indigestion or heartburn. 06/26/16   Joaquim Nam, MD  clopidogrel (PLAVIX) 75 MG tablet Take 1 tablet (75 mg total) by mouth daily for 18 days. 11/12/22 11/30/22  Alford Highland, MD  HYDROcodone-acetaminophen (NORCO/VICODIN) 5-325 MG tablet Take 1 tablet by mouth every 8 (eight) hours as needed for severe pain. Patient not taking: Reported on 11/13/2022 11/11/22   Alford Highland, MD  levofloxacin (LEVAQUIN) 500 MG tablet Take 1 tablet (500 mg total) by mouth daily for 26 days. 11/12/22 12/08/22  Alford Highland, MD  lisinopril (ZESTRIL) 20 MG tablet Take 1 tablet (20 mg total) by mouth daily. 11/12/22   Alford Highland, MD  Multiple Vitamin (MULTIVITAMIN WITH MINERALS) TABS tablet Take 1 tablet by mouth daily. 11/07/21   Kathlen Mody, MD  mycophenolate (CELLCEPT) 250 MG capsule Take 250-500 mg by mouth 2 (two) times daily. 2 capsules (500 mg) in the morning and one capsule (250 mg) in the evening 02/04/19   [provider]  omeprazole (PRILOSEC) 20 MG capsule Take 1 capsule (20 mg total) by mouth daily. 02/19/22   Karie Schwalbe, MD  polyethylene glycol (MIRALAX / GLYCOLAX) 17 g packet Take 17 g by mouth daily as needed for mild constipation. Patient not taking: Reported on 11/13/2022 11/11/22   Alford Highland, MD  predniSONE (DELTASONE) 10 MG tablet Take 10 mg by mouth daily with breakfast.    [provider]  rosuvastatin (CRESTOR) 20 MG tablet Take 1 tablet (20 mg total) by mouth daily. 11/12/22   Alford Highland, MD  senna (SENOKOT) 8.6 MG TABS tablet Take 2 tablets (17.2 mg total) by mouth daily. 01/01/22   Alwyn Ren, MD  tacrolimus (PROGRAF) 0.5 MG capsule Take 0.5-1 mg by mouth See admin instructions. Taking 0.5 mg in the morning  and 1 mg at bedtime    [provider]  tamsulosin (FLOMAX) 0.4 MG CAPS capsule TAKE 1 CAPSULE(0.4 MG) BY MOUTH DAILY Patient taking differently: Take 0.4 mg by mouth daily. 12/17/21   Karie Schwalbe, MD    Physical Exam: Vitals:   11/15/22 1847 11/15/22 1850 11/16/22 0043  BP:  (!) 141/117 (!) 165/95  Pulse:  99 80  Resp:  20 16  Temp:  98 F (36.7 C) 98.9 F (37.2 C)  TempSrc:  Oral Oral  SpO2:  98% 98%  Weight: 119 kg    Height: 6' (1.829 m)     Physical Exam Vitals and nursing note reviewed.  Constitutional:      General: He is not in acute distress. HENT:     Head: Normocephalic and atraumatic.  Cardiovascular:     Rate and Rhythm: Normal rate and regular rhythm.     Heart sounds: Normal heart sounds.  Pulmonary:     Effort: Pulmonary effort is normal.     Breath sounds: Normal breath sounds.  Abdominal:     Palpations: Abdomen is soft.     Tenderness: There is no abdominal tenderness.  Neurological:     Mental Status: Mental status is at baseline.     Labs on Admission: I have personally reviewed following labs and imaging studies  CBC: Recent Labs  Lab 11/09/22 0444 11/15/22 1850  WBC 9.4 14.2*   HGB 15.4 16.3  HCT 45.5 49.1  MCV 93.6 94.4  PLT 148* 200   Basic Metabolic Panel: Recent Labs  Lab 11/09/22 0444 11/10/22 0437 11/15/22 1850  NA 141 138 137  K 3.8 3.7 4.9  CL 108 105 103  CO2 25 24 24   GLUCOSE 129* 138* 204*  BUN 23 30* 27*  CREATININE 1.34* 1.53* 2.03*  CALCIUM 8.9 8.6* 9.0  PHOS 3.8  --   --    GFR: Estimated Creatinine Clearance: 49 mL/min (A) (by C-G formula based on SCr of 2.03 mg/dL (H)). Liver Function Tests: Recent Labs  Lab 11/09/22 0444 11/10/22 0437  AST  --  30  ALT  --  72*  ALKPHOS  --  45  BILITOT  --  1.5*  PROT  --  5.8*  ALBUMIN 3.5 3.6   No results for input(s): "LIPASE", "AMYLASE" in the last 168 hours. No results for input(s): "AMMONIA" in the last 168 hours. Coagulation Profile: No results for input(s): "INR", "PROTIME" in the last 168 hours. Cardiac Enzymes: No results for input(s): "CKTOTAL", "CKMB", "CKMBINDEX", "TROPONINI" in the last 168 hours. BNP (last 3 results) No results for input(s): "PROBNP" in the last 8760 hours. HbA1C: No results for input(s): "HGBA1C" in the last 72 hours. CBG: No results for input(s): "GLUCAP" in the last 168 hours. Lipid Profile: No results for input(s): "CHOL", "HDL", "LDLCALC", "TRIG", "CHOLHDL", "LDLDIRECT" in the last 72 hours. Thyroid Function Tests: No results for input(s): "TSH", "T4TOTAL", "FREET4", "T3FREE", "THYROIDAB" in the last 72 hours. Anemia Panel: No results for input(s): "VITAMINB12", "FOLATE", "FERRITIN", "TIBC", "IRON", "RETICCTPCT" in the last 72 hours. Urine analysis:    Component Value Date/Time   COLORURINE YELLOW (A) 11/15/2022 2102   APPEARANCEUR CLEAR (A) 11/15/2022 2102   APPEARANCEUR Clear 11/30/2013 1959   LABSPEC 1.010 11/15/2022 2102   LABSPEC 1.017 11/30/2013 1959   PHURINE 5.0 11/15/2022 2102   GLUCOSEU NEGATIVE 11/15/2022 2102   GLUCOSEU Negative 11/30/2013 1959   HGBUR NEGATIVE 11/15/2022 2102   HGBUR negative 05/25/2010 1153   BILIRUBINUR  NEGATIVE 11/15/2022 2102   BILIRUBINUR Negative 11/30/2013 1959   KETONESUR NEGATIVE 11/15/2022 2102   PROTEINUR NEGATIVE 11/15/2022 2102   UROBILINOGEN 0.2 08/25/2012 2256   NITRITE NEGATIVE 11/15/2022 2102   LEUKOCYTESUR NEGATIVE 11/15/2022 2102   LEUKOCYTESUR Negative 11/30/2013 1959    Radiological Exams on Admission: US Abdomen Limited RUQ (LIVER/GB)  Result Date: 11/15/2022 CLINICAL DATA:  Right upper quadrant pain.  Prior liver transplant. EXAM: ULTRASOUND ABDOMEN LIMITED RIGHT UPPER QUADRANT COMPARISON:  Abdominopelvic CT 11/05/2022 FINDINGS: Gallbladder: Surgically absent. Common bile duct: Diameter: 5 mm at the porta hepatis, Liver: Suboptimal parenchymal assessment due to habitus and poor acoustic window. Parenchyma appears heterogeneous and increased. No evidence of focal lesion. Portal vein is patent on color Doppler imaging with normal direction of blood flow towards the liver. Other: No right upper quadrant ascites. IMPRESSION: 1. Heterogeneous hepatic parenchyma with increased echogenicity, possible steatosis. No focal abnormality. 2. Post cholecystectomy.  No biliary dilatation. Electronically Signed   By: Narda Rutherford M.D.   On: 11/15/2022 22:45   DG Chest 2 View  Result Date: 11/15/2022 CLINICAL DATA:  Chest pain, possible allergic reaction EXAM: CHEST - 2 VIEW COMPARISON:  11/05/2022 FINDINGS: Mild enlargement of the cardiopericardial silhouette, without edema. The lungs appear clear. No blunting of the costophrenic angles. Atherosclerotic calcification of the aortic arch. Mild lower thoracic spondylosis. IMPRESSION: 1. Mild enlargement of the cardiopericardial silhouette, without edema. 2. Mild lower thoracic spondylosis. Electronically Signed   By: Gaylyn Rong M.D.   On: 11/15/2022 19:26     Data Reviewed: Relevant notes from primary care and specialist visits, past discharge summaries as available in EHR, including Care Everywhere. Prior diagnostic testing as  pertinent to current admission diagnoses Updated medications and problem lists for reconciliation ED course, including vitals, labs, imaging, treatment and response to treatment Triage notes, nursing and pharmacy notes and ED provider's notes Notable results as noted in HPI   Assessment and Plan: * Postural dizziness with presyncope Etiology uncertain, possibly related to dehydration given AKI Had an unremarkable echo on 5/23 with EF 60 to 65% Continuous cardiac monitoring Hydrate and evaluate Orthostatic vital signs  Chest pain Elevated troponin Patient complained of chest pains, lightheadedness Troponin elevated to 24 but EKG nonacute D-dimer negative so low suspicion for PE  Acute bacterial prostatitis 11/05/22 Patient was on long-term fluoroquinolone for another 3 weeks until 6/23 Urinalysis unremarkable He denies any prostate discomfort Will continue Levaquin for now but consideration for early discontinuation in view of patient complaining of pain in the legs Can consider urology input  History of liver transplant (HCC) Long-term immunosuppressant use Continue chronic immunosuppressive therapy (tacrolimus and mycophenolate mofetil) No acute issues suspected  Chronic diastolic heart failure (HCC) Clinically euvolemic Continue lisinopril  History of recent stroke Recent CVA 11/05/2022 treated with TNK negative MRI Continue aspirin.  Plavix to continue until 6/15 Continue Crestor  History of superior mesenteric vein thrombosis, postprocedural 2022 Leg pain Patient was on Eliquis 12/04/2020 to 06/05/2021 Follow bilateral lower extremity venous ultrasound due to complaints of leg pain----> D-dimer was negative  Acute renal failure superimposed on stage 3a chronic kidney disease (HCC) Creatinine 2.03 up from baseline of 1.38 Hydrate and monitor Avoid nephrotoxins     DVT prophylaxis: Lovenox  Consults: none  Advance Care Planning:   Code Status: Prior    Family Communication: none  Disposition Plan: Back to previous home environment  Severity of Illness: The appropriate patient status for this patient is OBSERVATION. Observation status is judged to  be reasonable and necessary in order to provide the required intensity of service to ensure the patient's safety. The patient's presenting symptoms, physical exam findings, and initial radiographic and laboratory data in the context of their medical condition is felt to place them at decreased risk for further clinical deterioration. Furthermore, it is anticipated that the patient will be medically stable for discharge from the hospital within 2 midnights of admission.   Author: Andris Baumann, MD 11/16/2022 1:03 AM  For on call review [email or web address].

## 2022-11-16 NOTE — Progress Notes (Signed)
Progress Note   Patient: Devin White:[id number] DOB: 19-Mar-1956 DOA: 11/15/2022     0 DOS: the patient was seen and examined on 11/16/2022   Brief hospital course: Taken from H&P.   ALLAN MODZELEWSKI is a 67 y.o. male with medical history significant for cirrhosis s/p liver transplant on chronic immunosuppressive therapy (tacrolimus and mycophenolate mofetil), frequent UTI on suppressive antibiotics, gout on chronic prednisone, HFpEF, provoked superior mesenteric vein thrombosis in 2022, (on Eliquis 11/2020-05/2021) , CKD 3a, hypertension, among other medical problems, recently hospitalized from 5/21 to 11/11/2022 for an acute CVA treated with TNK with negative MRI, with stay complicated by acute bacterial prostatitis, discharged on prolonged course of Levaquin, who presents to the ED due to  weakness and chest pain associated with leg cramping.  His chest pain got worse while going to supermarket, called his PCP and they advised him to come to ED.  Patient is on a prolonged course of fluoroquinolone until 6/23 due to recent bacterial prostatitis.  ED course and data review: BP 141/117 with otherwise normal vitals Labs notable for leukocytosis of 14,000, creatinine of 2.03 up from baseline of 1.38, blood glucose 204.  Troponin 24>.24.  Urinalysis unremarkable.  EKG, showing sinus tachycardia at 101 with no concerning ST-T wave changes. Chest x-ray compared to recent on 5/21, shows mild enlargement of the cardiopericardial silhouette without edema. Right upper quadrant ultrasound showing possible steatosis, postcholecystectomy and no biliary dilatation.  6/1: Vital stable.  Lower extremity venous Doppler negative for DVT.  Continue to have calf pain.  Having burning substernal pain which seems more like GERD. Switching levofloxacin with Bactrim.  Also ordered GI cocktail.  Leukocytosis resolved and renal function seems improving.      Assessment and Plan: * Postural dizziness with  presyncope Etiology uncertain, possibly related to dehydration given AKI Had an unremarkable echo on 5/23 with EF 60 to 65% Continuous cardiac monitoring Hydrate and evaluate Orthostatic vital signs  Chest pain Elevated troponin. D-dimer negative so low suspicion for PE, lower extremity venous Doppler negative for DVT. Barely positive troponin with a flat curve at 24.  Likely due to GERD as it feels more like burning which is going up to his throat. -GI cocktail -PPI -Continue to monitor  Acute bacterial prostatitis 11/05/22 Patient was on long-term fluoroquinolone for another 3 weeks until 6/23 Urinalysis unremarkable He denies any prostate discomfort Worsening bilateral calf pain which can be due to levofloxacin so we will discontinue fluoroquinolone and switching him to Bactrim. -Need to follow-up with urology as outpatient  History of liver transplant (HCC) Long-term immunosuppressant use Continue chronic immunosuppressive therapy (tacrolimus and mycophenolate mofetil) No acute issues suspected  Chronic diastolic heart failure (HCC) Clinically euvolemic Continue lisinopril  History of recent stroke Recent CVA 11/05/2022 treated with TNK negative MRI Continue aspirin.  Plavix to continue until 6/15 Continue Crestor  History of superior mesenteric vein thrombosis, postprocedural 2022 Leg pain Patient was on Eliquis 12/04/2020 to 06/05/2021 Follow bilateral lower extremity venous ultrasound due to complaints of leg pain----> D-dimer was negative  Acute renal failure superimposed on stage 3a chronic kidney disease (HCC) Creatinine 2.03 up from baseline of 1.38 on admission, started improving with IV fluid. Hydrate and monitor Avoid nephrotoxins   Subjective: Patient was seen and examined today.  Complaining of some burning substernal chest pain which was little worse overnight.  No associated symptoms.  Worsening bilateral leg pain which is mostly in his lower posterior  calf.  Physical Exam: Vitals:  11/16/22 0648 11/16/22 0900 11/16/22 1300 11/16/22 1314  BP: 128/73 137/81 124/76   Pulse: 74 74 81   Resp: 16 16 18    Temp:  97.9 F (36.6 C)  97.9 F (36.6 C)  TempSrc:  Oral  Oral  SpO2: 94% 96% 92%   Weight:      Height:       General.  Obese gentleman, in no acute distress. Pulmonary.  Lungs clear bilaterally, normal respiratory effort. CV.  Regular rate and rhythm, no JVD, rub or murmur. Abdomen.  Soft, nontender, nondistended, BS positive. CNS.  Alert and oriented .  No focal neurologic deficit. Extremities.  No edema, no cyanosis, pulses intact and symmetrical. Psychiatry.  Judgment and insight appears normal.   Data Reviewed: Prior data reviewed  Family Communication: Discussed with patient  Disposition: Status is: Observation The patient remains OBS appropriate and will d/c before 2 midnights.  Planned Discharge Destination: Home  Time spent:  minutes  This record has been created using Conservation officer, historic buildings. Errors have been sought and corrected,but may not always be located. Such creation errors do not reflect on the standard of care.   Author: Arnetha Courser, MD 11/16/2022 1:19 PM  For on call review [email or web address].

## 2022-11-16 NOTE — Assessment & Plan Note (Addendum)
Long-term immunosuppressant use Continue chronic immunosuppressive therapy (tacrolimus and mycophenolate mofetil) No acute issues suspected

## 2022-11-16 NOTE — Assessment & Plan Note (Signed)
Clinically euvolemic Continue lisinopril

## 2022-11-16 NOTE — Assessment & Plan Note (Addendum)
Elevated troponin. D-dimer negative so low suspicion for PE, lower extremity venous Doppler negative for DVT. Barely positive troponin with a flat curve at 24.  Likely due to GERD as it feels more like burning which is going up to his throat. -GI cocktail -PPI -Continue to monitor

## 2022-11-16 NOTE — ED Notes (Signed)
Admitting MD paged re: prominent, swollen, TTP Left parotid area.

## 2022-11-17 DIAGNOSIS — Z944 Liver transplant status: Secondary | ICD-10-CM | POA: Diagnosis not present

## 2022-11-17 DIAGNOSIS — T7840XA Allergy, unspecified, initial encounter: Secondary | ICD-10-CM | POA: Diagnosis not present

## 2022-11-17 DIAGNOSIS — I5032 Chronic diastolic (congestive) heart failure: Secondary | ICD-10-CM | POA: Diagnosis not present

## 2022-11-17 DIAGNOSIS — N179 Acute kidney failure, unspecified: Secondary | ICD-10-CM

## 2022-11-17 DIAGNOSIS — R42 Dizziness and giddiness: Secondary | ICD-10-CM | POA: Diagnosis not present

## 2022-11-17 DIAGNOSIS — N1831 Chronic kidney disease, stage 3a: Secondary | ICD-10-CM | POA: Diagnosis not present

## 2022-11-17 DIAGNOSIS — R55 Syncope and collapse: Secondary | ICD-10-CM | POA: Diagnosis not present

## 2022-11-17 DIAGNOSIS — I509 Heart failure, unspecified: Secondary | ICD-10-CM | POA: Diagnosis not present

## 2022-11-17 DIAGNOSIS — R079 Chest pain, unspecified: Secondary | ICD-10-CM | POA: Diagnosis not present

## 2022-11-17 DIAGNOSIS — E669 Obesity, unspecified: Secondary | ICD-10-CM

## 2022-11-17 LAB — CBG MONITORING, ED: Glucose-Capillary: 128 mg/dL — ABNORMAL HIGH (ref 70–99)

## 2022-11-17 MED ORDER — SULFAMETHOXAZOLE-TRIMETHOPRIM 800-160 MG PO TABS: 1.0000 | ORAL_TABLET | Freq: Two times a day (BID) | ORAL | 0 refills | Status: AC

## 2022-11-17 NOTE — ED Notes (Signed)
Pt desaturated upon attempt to ambulation to 85% on 2 L.

## 2022-11-17 NOTE — ED Notes (Signed)
95% RA at rest, 85% RA upon standing/exertion, 96% [place] on 2 L upon exertion.

## 2022-11-17 NOTE — ED Notes (Addendum)
Pt requesting to leave, pt states that he does not feel he is being cared for here and has been told there is no reason for him to stay. This RN educated pt and pt daughter that pt needs oxygen at home for when he is up moving around, due to him desaturating while exerting himself. Pt states he called the oxygen company and they said they would call him back and has not received a call. Pt states he feels more comfortable at [facility], and this RN told pt he should go to [employer] and get everything settled, so he does not get hurt staying at home alone. Pt educated on risks of going home and becoming hypoxic and daughter educated. Pt decided to go home. Hospitalist notified and no new orders. Hospitalist stated that someone could come see him tomorrow, and to save his information for TOC to follow up.

## 2022-11-17 NOTE — Discharge Summary (Addendum)
Physician Discharge Summary   Patient: Devin White MRN: [id number] DOB: 1955-09-14  Admit date:     11/15/2022  Discharge date: 11/17/22  Discharge Physician: Arnetha Courser   PCP: Karie Schwalbe, MD   Recommendations at discharge:  Please follow-up thyroid labs.  Patient has elevated TSH and might need to be started on thyroid replacement. Please arrange outpatient sleep study as it can be contributory to his excessive daytime fatigue.  He was showing some desaturation while sleeping and concern of sleep apnea. Please obtain CBC and BMP within a week Follow-up with primary care doctor within a week. Follow-up with urology within a week  Discharge Diagnoses: Principal Problem:   Postural dizziness with presyncope Active Problems:   Chest pain   History of liver transplant (HCC)   Acute bacterial prostatitis 11/05/22   Chronic diastolic heart failure (HCC)   Long-term use of immunosuppressant medication   Acute renal failure superimposed on stage 3a chronic kidney disease (HCC)   Obesity (BMI 30-39.9)   History of superior mesenteric vein thrombosis, postprocedural 2022   History of recent stroke   Allergic reaction   Hospital Course: Taken from H&P.   Devin White is a 67 y.o. male with medical history significant for cirrhosis s/p liver transplant on chronic immunosuppressive therapy (tacrolimus and mycophenolate mofetil), frequent UTI on suppressive antibiotics, gout on chronic prednisone, HFpEF, provoked superior mesenteric vein thrombosis in 2022, (on Eliquis 11/2020-05/2021) , CKD 3a, hypertension, among other medical problems, recently hospitalized from 5/21 to 11/11/2022 for an acute CVA treated with TNK with negative MRI, with stay complicated by acute bacterial prostatitis, discharged on prolonged course of Levaquin, who presents to the ED due to  weakness and chest pain associated with leg cramping.  His chest pain got worse while going to supermarket, called his  PCP and they advised him to come to ED.  Patient is on a prolonged course of fluoroquinolone until 6/23 due to recent bacterial prostatitis.  ED course and data review: BP 141/117 with otherwise normal vitals Labs notable for leukocytosis of 14,000, creatinine of 2.03 up from baseline of 1.38, blood glucose 204.  Troponin 24>.24.  Urinalysis unremarkable.  EKG, showing sinus tachycardia at 101 with no concerning ST-T wave changes. Chest x-ray compared to recent on 5/21, shows mild enlargement of the cardiopericardial silhouette without edema. Right upper quadrant ultrasound showing possible steatosis, postcholecystectomy and no biliary dilatation.  6/1: Vital stable.  Lower extremity venous Doppler negative for DVT.  Continue to have calf pain.  Having burning substernal pain which seems more like GERD. Switching levofloxacin with Bactrim.  Also ordered GI cocktail.  Leukocytosis resolved and renal function seems improving.  6/2: Hemodynamically stable.  TSH elevated at 8.048, rest of the thyroid profile ordered.  Patient's excessive fatigue either can be due to hypothyroidism or sleep apnea as he is obese.  There was also some concern of mild desaturation while sleeping which improved with 2 L of oxygen.  Patient needs outpatient sleep study for further management.  Discussed with patient to either wait for the rest of the labs or start thyroid replacement.  Decided that can be done by PCP and he will have his rest of the thyroid panel available to review.  We switched his Levaquin with Bactrim for his prostatitis due to his complaint of calf pain which can be due to Levaquin.  No leg pain before discharge.  He need to follow-up with urology closely for further recommendations.  Patient also desaturated  on ambulation which can also be contributory to his fatigue.  Requiring 2 L of oxygen which was ordered.  His PCP should be able to monitor and titrate.  He will still need a sleep study to  diagnose and manage his sleep apnea.  Patient will continue the rest of his home medications and need to have a close follow-up with his providers for further recommendations.    Assessment and Plan: * Postural dizziness with presyncope Etiology uncertain, possibly related to dehydration given AKI Had an unremarkable echo on 5/23 with EF 60 to 65% Continuous cardiac monitoring Hydrate and evaluate Orthostatic vital signs  Chest pain Elevated troponin. D-dimer negative so low suspicion for PE, lower extremity venous Doppler negative for DVT. Barely positive troponin with a flat curve at 24.  Likely due to GERD as it feels more like burning which is going up to his throat. -GI cocktail -PPI -Continue to monitor  Acute bacterial prostatitis 11/05/22 Patient was on long-term fluoroquinolone for another 3 weeks until 6/23 Urinalysis unremarkable He denies any prostate discomfort Worsening bilateral calf pain which can be due to levofloxacin so we will discontinue fluoroquinolone and switching him to Bactrim. -Need to follow-up with urology as outpatient  History of liver transplant (HCC) Long-term immunosuppressant use Continue chronic immunosuppressive therapy (tacrolimus and mycophenolate mofetil) No acute issues suspected  Chronic diastolic heart failure (HCC) Clinically euvolemic Continue lisinopril  History of recent stroke Recent CVA 11/05/2022 treated with TNK negative MRI Continue aspirin.  Plavix to continue until 6/15 Continue Crestor  History of superior mesenteric vein thrombosis, postprocedural 2022 Leg pain Patient was on Eliquis 12/04/2020 to 06/05/2021 Follow bilateral lower extremity venous ultrasound due to complaints of leg pain----> D-dimer was negative  Acute renal failure superimposed on stage 3a chronic kidney disease (HCC) Creatinine 2.03 up from baseline of 1.38 on admission, started improving with IV fluid. Hydrate and monitor Avoid  nephrotoxins   Consultants: None Procedures performed: None Disposition: Home Diet recommendation:  Discharge Diet Orders (From admission, onward)     Start     Ordered   11/17/22 0000  Diet - low sodium heart healthy        11/17/22 1012           Cardiac diet DISCHARGE MEDICATION: Allergies as of 11/17/2022   No Known Allergies      Medication List     STOP taking these medications    HYDROcodone-acetaminophen 5-325 MG tablet Commonly known as: NORCO/VICODIN   levofloxacin 500 MG tablet Commonly known as: LEVAQUIN       TAKE these medications    acetaminophen 325 MG tablet Commonly known as: TYLENOL Take 650 mg by mouth every 6 (six) hours as needed for mild pain.   amLODipine 10 MG tablet Commonly known as: NORVASC Take 1 tablet (10 mg total) by mouth daily.   aspirin EC 81 MG tablet Take 1 tablet (81 mg total) by mouth daily. Swallow whole.   calcium carbonate 500 MG chewable tablet Commonly known as: Tums Chew 1 tablet (200 mg of elemental calcium total) by mouth 2 (two) times daily as needed for indigestion or heartburn.   clopidogrel 75 MG tablet Commonly known as: PLAVIX Take 1 tablet (75 mg total) by mouth daily for 18 days.   lisinopril 20 MG tablet Commonly known as: ZESTRIL Take 1 tablet (20 mg total) by mouth daily.   multivitamin with minerals Tabs tablet Take 1 tablet by mouth daily.   mycophenolate 250 MG capsule Commonly  known as: CELLCEPT Take 250-500 mg by mouth 2 (two) times daily. 2 capsules (500 mg) in the morning and one capsule (250 mg) in the evening   omeprazole 20 MG capsule Commonly known as: PRILOSEC Take 1 capsule (20 mg total) by mouth daily.   polyethylene glycol 17 g packet Commonly known as: MIRALAX / GLYCOLAX Take 17 g by mouth daily as needed for mild constipation.   predniSONE 10 MG tablet Commonly known as: DELTASONE Take 10 mg by mouth daily with breakfast.   rosuvastatin 20 MG tablet Commonly  known as: CRESTOR Take 1 tablet (20 mg total) by mouth daily.   senna 8.6 MG Tabs tablet Commonly known as: SENOKOT Take 2 tablets (17.2 mg total) by mouth daily.   sulfamethoxazole-trimethoprim 800-160 MG tablet Commonly known as: BACTRIM DS Take 1 tablet by mouth every 12 (twelve) hours for 22 days.   tacrolimus 0.5 MG capsule Commonly known as: PROGRAF Take 0.5-1 mg by mouth See admin instructions. Taking 0.5 mg in the morning and 1 mg at bedtime   tamsulosin 0.4 MG Caps capsule Commonly known as: FLOMAX TAKE 1 CAPSULE(0.4 MG) BY MOUTH DAILY What changed: See the new instructions.               Durable Medical Equipment  (From admission, onward)           Start     Ordered   11/17/22 1348  For home use only DME oxygen  Once       Question Answer Comment  Length of Need Lifetime   Mode or (Route) Nasal cannula   Liters per Minute 2   Frequency Continuous (stationary and portable oxygen unit needed)   Oxygen conserving device Yes   Oxygen delivery system Gas      11/17/22 1347            Follow-up Information     Karie Schwalbe, MD .   Specialties: Internal Medicine, Pediatrics Contact information: [address] [place] [address] [phone]                Discharge Exam: Ceasar Mons Weights   11/15/22 1847  Weight: 119 kg   General.  Obese gentleman, in no acute distress. Pulmonary.  Lungs clear bilaterally, normal respiratory effort. CV.  Regular rate and rhythm, no JVD, rub or murmur. Abdomen.  Soft, nontender, nondistended, BS positive. CNS.  Alert and oriented .  No focal neurologic deficit. Extremities.  No edema, no cyanosis, pulses intact and symmetrical. Psychiatry.  Judgment and insight appears normal.   Condition at discharge: stable  The results of significant diagnostics from this hospitalization (including imaging, microbiology, ancillary and laboratory) are listed below for reference.   Imaging  Studies: US Venous Img Lower Bilateral (DVT)  Result Date: 11/16/2022 CLINICAL DATA:  Syncope Leg pain for 2 days EXAM: Bilateral lower Extremity Venous Doppler Ultrasound TECHNIQUE: Gray-scale sonography with compression, as well as color and duplex ultrasound, were performed to evaluate the deep venous system(s) from the level of the common femoral vein through the popliteal and proximal calf veins. COMPARISON:  None available FINDINGS: VENOUS Normal compressibility of the common femoral, superficial femoral, and popliteal veins, as well as the visualized calf veins. Visualized portions of profunda femoral vein and great saphenous vein unremarkable. No filling defects to suggest DVT on grayscale or color Doppler imaging. Doppler waveforms show normal direction of venous flow, normal respiratory plasticity and response to augmentation. OTHER None. Limitations: none IMPRESSION: No  lower extremity DVT. Electronically Signed   By: Acquanetta Belling M.D.   On: 11/16/2022 08:13   US Abdomen Limited RUQ (LIVER/GB)  Result Date: 11/15/2022 CLINICAL DATA:  Right upper quadrant pain.  Prior liver transplant. EXAM: ULTRASOUND ABDOMEN LIMITED RIGHT UPPER QUADRANT COMPARISON:  Abdominopelvic CT 11/05/2022 FINDINGS: Gallbladder: Surgically absent. Common bile duct: Diameter: 5 mm at the porta hepatis, Liver: Suboptimal parenchymal assessment due to habitus and poor acoustic window. Parenchyma appears heterogeneous and increased. No evidence of focal lesion. Portal vein is patent on color Doppler imaging with normal direction of blood flow towards the liver. Other: No right upper quadrant ascites. IMPRESSION: 1. Heterogeneous hepatic parenchyma with increased echogenicity, possible steatosis. No focal abnormality. 2. Post cholecystectomy.  No biliary dilatation. Electronically Signed   By: Narda Rutherford M.D.   On: 11/15/2022 22:45   DG Chest 2 View  Result Date: 11/15/2022 CLINICAL DATA:  Chest pain, possible allergic  reaction EXAM: CHEST - 2 VIEW COMPARISON:  11/05/2022 FINDINGS: Mild enlargement of the cardiopericardial silhouette, without edema. The lungs appear clear. No blunting of the costophrenic angles. Atherosclerotic calcification of the aortic arch. Mild lower thoracic spondylosis. IMPRESSION: 1. Mild enlargement of the cardiopericardial silhouette, without edema. 2. Mild lower thoracic spondylosis. Electronically Signed   By: Gaylyn Rong M.D.   On: 11/15/2022 19:26   MR BRAIN WO CONTRAST  Result Date: 11/10/2022 CLINICAL DATA:  Headache with left arm pain EXAM: MRI HEAD WITHOUT CONTRAST TECHNIQUE: Multiplanar, multiecho pulse sequences of the brain and surrounding structures were obtained without intravenous contrast. COMPARISON:  11/08/2022 FINDINGS: Brain: No acute infarct, mass effect or extra-axial collection. No acute or chronic hemorrhage. There is multifocal hyperintense T2-weighted signal within the white matter. Parenchymal volume and CSF spaces are normal. The midline structures are normal. Vascular: Major flow voids are preserved. Skull and upper cervical spine: Normal calvarium and skull base. Visualized upper cervical spine and soft tissues are normal. Sinuses/Orbits:No paranasal sinus fluid levels or advanced mucosal thickening. No mastoid or middle ear effusion. Normal orbits. IMPRESSION: 1. No acute intracranial abnormality. 2. Multifocal hyperintense T2-weighted signal within the white matter, nonspecific but most commonly seen in the setting of chronic small vessel ischemia. Electronically Signed   By: Deatra Robinson M.D.   On: 11/10/2022 23:11   MR CERVICAL SPINE WO CONTRAST  Result Date: 11/10/2022 CLINICAL DATA:  Headache with left arm pain EXAM: MRI CERVICAL SPINE WITHOUT CONTRAST TECHNIQUE: Multiplanar, multisequence MR imaging of the cervical spine was performed. No intravenous contrast was administered. COMPARISON:  None Available. FINDINGS: Alignment: Mild anterolisthesis of C3  and C4. Straightening of the cervical spine. Vertebrae: No fracture, evidence of discitis, or bone lesion. Cord: Normal signal and morphology. Posterior Fossa, vertebral arteries, paraspinal tissues: Negative. Discs: Multilevel degenerate disc disease with disc height loss and anterior osteophytes. C2-3: No significant disc bulge. No neural foraminal stenosis. No central canal stenosis. C3-4: Asymmetric disc extrusion with flattening of the thecal sac and severe left neural foraminal stenosis. Mild right neural foraminal stenosis. C4-5: Circumferential disc bulge with flattening of thecal sac and severe bilateral neural foraminal stenosis left worse than the right. C5-6: Circumferential disc bulge with flattening of the thecal sac. Moderate bilateral neural foraminal stenosis right worse than the left. C6-7: Disc osteophyte complex with moderate disc bulge. Mild right and moderate left neural foraminal stenosis. C7-T1: No significant disc bulge. No neural foraminal stenosis. No central canal stenosis. IMPRESSION: 1. No evidence of fracture or subluxation. 2. Multilevel spondylosis as described above.  3. Severe left neural foraminal stenosis at C3-4. 4. Severe bilateral neural foraminal stenosis at C4-5. 5. Moderate bilateral neural foraminal stenosis at C5-6, right worse than left. 6. Mild right and moderate left neural foraminal stenosis at C6-7. Electronically Signed   By: Larose Hires D.O.   On: 11/10/2022 20:44   MR BRAIN WO CONTRAST  Result Date: 11/08/2022 CLINICAL DATA:  Provided history: Stroke, follow-up. EXAM: MRI HEAD WITHOUT CONTRAST TECHNIQUE: Multiplanar, multiecho pulse sequences of the brain and surrounding structures were obtained without intravenous contrast. COMPARISON:  Non-contrast head CT and CT angiogram head/neck 11/07/2022. Brain MRI 11/02/2021. FINDINGS: Brain: No age advanced or lobar predominant parenchymal atrophy. Multifocal T2 FLAIR hyperintense signal abnormality within the  cerebral white matter, nonspecific but compatible with moderate chronic small vessel ischemic disease. There are a few nonspecific punctate chronic microhemorrhages scattered within the supratentorial brain. There is no acute infarct. No evidence of an intracranial mass. No extra-axial fluid collection. No midline shift. Vascular: Maintained flow voids within the proximal large arterial vessels. Dominant left vertebral artery. Skull and upper cervical spine: No focal suspicious marrow lesion. Sinuses/Orbits: No mass or acute finding within the imaged orbits. Mild mucosal thickening within the right maxillary sinus. 18 mm mucous retention cyst within the left maxillary sinus. Small mucous retention cysts within the right sphenoid sinus. Mild mucosal thickening within the left sphenoid sinus. IMPRESSION: 1. No evidence of an acute intracranial abnormality. 2. Moderate chronic small vessel ischemic changes within the cerebral white matter, similar to the prior brain MRI of 09/14/2021. 3. Paranasal sinus disease as described. Electronically Signed   By: Jackey Loge D.O.   On: 11/08/2022 07:38   ECHOCARDIOGRAM COMPLETE  Result Date: 11/07/2022    ECHOCARDIOGRAM REPORT   Patient Name:   ADRYEN JOHANSSON Date of Exam: 11/07/2022 Medical Rec #:  [id number]        Height:       72.0 in Accession #:    [id number]       Weight:       263.2 lb Date of Birth:  1956-01-15        BSA:          2.394 m Patient Age:    66 years         BP:           180/110 mmHg Patient Gender: M                HR:           74 bpm. Exam Location:  [facility] Procedure: 2D Echo, Cardiac Doppler and Color Doppler Indications:     Stroke  History:         Patient has prior history of Echocardiogram examinations, most                  recent 11/05/2021. CHF, Stroke and TIA; Risk                  Factors:Hypertension and Sleep Apnea. CKD, Liver transplant.  Sonographer:     Mikki Harbor Referring Phys:  [id number] Lanora Manis ACHIENG OUMA Diagnosing Phys:  Julien Nordmann MD  Sonographer Comments: Technically difficult study due to poor echo windows, no subcostal window and patient is obese. IMPRESSIONS  1. Left ventricular ejection fraction, by estimation, is 60 to 65%. The left ventricle has normal function. The left ventricle has no regional wall motion abnormalities. There is moderate left ventricular hypertrophy. Left ventricular diastolic parameters are consistent with  Grade I diastolic dysfunction (impaired relaxation).  2. Right ventricular systolic function is normal. The right ventricular size is normal.  3. The mitral valve is normal in structure. No evidence of mitral valve regurgitation. No evidence of mitral stenosis.  4. The aortic valve is normal in structure. Aortic valve regurgitation is not visualized. No aortic stenosis is present.  5. There is borderline dilatation of the aortic root, measuring 39 mm. There is borderline dilatation of the ascending aorta, measuring 36 mm.  6. The inferior vena cava is normal in size with greater than 50% respiratory variability, suggesting right atrial pressure of 3 mmHg. FINDINGS  Left Ventricle: Left ventricular ejection fraction, by estimation, is 60 to 65%. The left ventricle has normal function. The left ventricle has no regional wall motion abnormalities. The left ventricular internal cavity size was normal in size. There is  moderate left ventricular hypertrophy. Left ventricular diastolic parameters are consistent with Grade I diastolic dysfunction (impaired relaxation). Right Ventricle: The right ventricular size is normal. No increase in right ventricular wall thickness. Right ventricular systolic function is normal. Left Atrium: Left atrial size was normal in size. Right Atrium: Right atrial size was normal in size. Pericardium: There is no evidence of pericardial effusion. Mitral Valve: The mitral valve is normal in structure. No evidence of mitral valve regurgitation. No evidence of mitral valve  stenosis. MV peak gradient, 3.8 mmHg. The mean mitral valve gradient is 1.0 mmHg. Tricuspid Valve: The tricuspid valve is normal in structure. Tricuspid valve regurgitation is not demonstrated. No evidence of tricuspid stenosis. Aortic Valve: The aortic valve is normal in structure. Aortic valve regurgitation is not visualized. No aortic stenosis is present. Aortic valve mean gradient measures 4.0 mmHg. Aortic valve peak gradient measures 7.4 mmHg. Aortic valve area, by VTI measures 3.14 cm. Pulmonic Valve: The pulmonic valve was normal in structure. Pulmonic valve regurgitation is not visualized. No evidence of pulmonic stenosis. Aorta: The aortic root is normal in size and structure. There is borderline dilatation of the aortic root, measuring 39 mm. There is borderline dilatation of the ascending aorta, measuring 36 mm. Venous: The inferior vena cava is normal in size with greater than 50% respiratory variability, suggesting right atrial pressure of 3 mmHg. IAS/Shunts: No atrial level shunt detected by color flow Doppler.  LEFT VENTRICLE PLAX 2D LVIDd:         4.90 cm   Diastology LVIDs:         3.30 cm   LV e' medial:    4.13 cm/s LV PW:         1.20 cm   LV E/e' medial:  12.1 LV IVS:        1.50 cm   LV e' lateral:   6.53 cm/s LVOT diam:     2.10 cm   LV E/e' lateral: 7.6 LV SV:         95 LV SV Index:   40 LVOT Area:     3.46 cm  RIGHT VENTRICLE RV Basal diam:  3.55 cm RV Mid diam:    3.30 cm RV S prime:     13.20 cm/s TAPSE (M-mode): 2.9 cm LEFT ATRIUM             Index        RIGHT ATRIUM           Index LA diam:        4.30 cm 1.80 cm/m   RA Area:     17.60 cm  LA Vol (A2C):   88.8 ml 37.09 ml/m  RA Volume:   49.50 ml  20.67 ml/m LA Vol (A4C):   44.4 ml 18.54 ml/m LA Biplane Vol: 63.6 ml 26.56 ml/m  AORTIC VALVE                    PULMONIC VALVE AV Area (Vmax):    3.06 cm     PV Vmax:       1.28 m/s AV Area (Vmean):   2.86 cm     PV Peak grad:  6.6 mmHg AV Area (VTI):     3.14 cm AV Vmax:            136.00 cm/s AV Vmean:          97.000 cm/s AV VTI:            0.303 m AV Peak Grad:      7.4 mmHg AV Mean Grad:      4.0 mmHg LVOT Vmax:         120.00 cm/s LVOT Vmean:        80.100 cm/s LVOT VTI:          0.275 m LVOT/AV VTI ratio: 0.91  AORTA Ao Root diam: 3.90 cm Ao Asc diam:  3.60 cm MITRAL VALVE MV Area (PHT): 2.25 cm    SHUNTS MV Area VTI:   4.25 cm    Systemic VTI:  0.28 m MV Peak grad:  3.8 mmHg    Systemic Diam: 2.10 cm MV Mean grad:  1.0 mmHg MV Vmax:       0.98 m/s MV Vmean:      47.6 cm/s MV Decel Time: 337 msec MV E velocity: 49.90 cm/s MV A velocity: 77.80 cm/s MV E/A ratio:  0.64 Julien Nordmann MD Electronically signed by Julien Nordmann MD Signature Date/Time: 11/07/2022/1:09:12 PM    Final    CT ANGIO HEAD NECK W WO CM W PERF (CODE STROKE)  Result Date: 11/07/2022 CLINICAL DATA:  67 year old male code stroke presentation. EXAM: CT ANGIOGRAPHY HEAD AND NECK CT PERFUSION BRAIN TECHNIQUE: Multidetector CT imaging of the head and neck was performed using the standard protocol during bolus administration of intravenous contrast. Multiplanar CT image reconstructions and MIPs were obtained to evaluate the vascular anatomy. Carotid stenosis measurements (when applicable) are obtained utilizing NASCET criteria, using the distal internal carotid diameter as the denominator. Multiphase CT imaging of the brain was performed following IV bolus contrast injection. Subsequent parametric perfusion maps were calculated using RAPID software. RADIATION DOSE REDUCTION: This exam was performed according to the departmental dose-optimization program which includes automated exposure control, adjustment of the mA and/or kV according to patient size and/or use of iterative reconstruction technique. CONTRAST:  OMNIPAQUE IOHEXOL 350 MG/ML SOLN COMPARISON:  Plain head CT 0547 hours today. FINDINGS: CT Brain Perfusion Findings: ASPECTS: 10 CBF (<30%) Volume: 0mL.  No CBF for CBV parameter abnormality. Perfusion  (Tmax>6.0s) volume: 0mL Mismatch Volume: Not applicable Infarction Location:Not applicable CTA NECK Skeleton: Bulky cervical spine facet, disc and endplate degeneration. Mild degenerative appearing anterolisthesis of C3 on C4. No acute osseous abnormality identified. Upper chest: Negative. Other neck: Negative. Aortic arch: 3 vessel arch configuration.  No arch atherosclerosis. Right carotid system: Tortuous brachiocephalic artery and right CCA origin. No right carotid plaque or stenosis to the skull base. Left carotid system: Tortuous proximal left CCA. No left carotid plaque or stenosis to the skull base. Vertebral arteries: Tortuous proximal right subclavian artery with no  plaque or stenosis. Non dominant right vertebral artery origin is normal. The right vertebral is diminutive throughout the neck but patent to the skull base with no significant plaque or stenosis identified. No proximal left subclavian artery plaque or stenosis. Normal left vertebral artery origin. Dominant left vertebral artery is patent with mild tortuosity to the skull base. No significant plaque or stenosis. CTA HEAD Posterior circulation: Diminutive right vertebral artery remains patent to the vertebrobasilar junction. Dominant left V4 segment with mild calcified plaque, no significant stenosis. Normal left PICA origin. Right AICA appears dominant. Patent basilar artery is mildly to moderately irregular (series 14, image 18). But there is no significant basilar stenosis. Patent SCA origins. Fetal type bilateral PCA origins, more so the left. Bilateral PCA branches are within normal limits. However, there is mild to moderate irregularity and stenosis of the left posterior communicating artery series 12, image 22. Anterior circulation: Both ICA siphons are patent. Left siphon mild to moderate calcified plaque with no stenosis. Normal left ophthalmic and posterior communicating artery origins. Mild right siphon calcified plaque with no  stenosis. Normal right ophthalmic and posterior communicating artery origins. Patent carotid termini, MCA and ACA origins. Dominant right and diminutive left ACA A1 segments. Anterior communicating artery is within normal limits. Bilateral ACA branches are within normal limits, the right A2 is mildly dominant. Left MCA M1 segment and bifurcation are patent without stenosis. Right MCA M1 segment and trifurcation are patent without stenosis. Bilateral MCA branches are within normal limits. Venous sinuses: Early contrast timing, grossly patent. Anatomic variants: Dominant left vertebral artery, right is diminutive. Fetal type bilateral PCA origins. Dominant right ACA A1. Review of the MIP images confirms the above findings IMPRESSION: 1. Negative for large vessel occlusion.  Negative CT Perfusion. 2. Intracranial more so than extracranial atherosclerosis. Up to moderate irregularity of the Basilar Artery, but no significant Basilar stenosis. ICA siphon calcified plaque without stenosis. Mild to moderate irregularity and stenosis of the Left Posterior Communicating Artery (fetal Left PCA origin). Electronically Signed   By: Odessa Fleming M.D.   On: 11/07/2022 07:02   CT HEAD CODE STROKE WO CONTRAST  Addendum Date: 11/07/2022   ADDENDUM REPORT: 11/07/2022 06:03 ADDENDUM: Study discussed by telephone with NP BRENDA MORRISON on 11/07/2022 at 0601 hours. Electronically Signed   By: Odessa Fleming M.D.   On: 11/07/2022 06:03   Result Date: 11/07/2022 CLINICAL DATA:  Code stroke. 67 year old male with hypertension, tingling, left vision changes. EXAM: CT HEAD WITHOUT CONTRAST TECHNIQUE: Contiguous axial images were obtained from the base of the skull through the vertex without intravenous contrast. RADIATION DOSE REDUCTION: This exam was performed according to the departmental dose-optimization program which includes automated exposure control, adjustment of the mA and/or kV according to patient size and/or use of iterative  reconstruction technique. COMPARISON:  Brain MRI 11/02/2021, head CT 12/29/2021. FINDINGS: Brain: Cerebral volume remains normal for age. No midline shift, ventriculomegaly, mass effect, evidence of mass lesion, intracranial hemorrhage or evidence of cortically based acute infarction. Patchy and confluent bilateral white matter hypodensity appears stable from the exam is last year. Vascular: Calcified atherosclerosis at the skull base. No suspicious intracranial vascular hyperdensity. Skull: No acute osseous abnormality identified. Sinuses/Orbits: Chronic mild maxillary sinus mucosal thickening and left maxillary retention cyst. Small fluid level in the right sphenoid sinus has decreased. Tympanic cavities and mastoids appear well aerated. Other: No gaze deviation.  Negative scalp soft tissues. ASPECTS [facility] Stroke Program Early CT Score) Total score (0-10 with 10 being normal):  10 IMPRESSION: 1. No acute cortically based infarct or acute intracranial hemorrhage identified. ASPECTS 10. 2. Stable non contrast CT appearance of white matter disease since last year. Electronically Signed: By: Odessa Fleming M.D. On: 11/07/2022 05:56   CT ABDOMEN PELVIS W CONTRAST  Result Date: 11/05/2022 CLINICAL DATA:  Lower back and abdominal pain EXAM: CT ABDOMEN AND PELVIS WITH CONTRAST TECHNIQUE: Multidetector CT imaging of the abdomen and pelvis was performed using the standard protocol following bolus administration of intravenous contrast. RADIATION DOSE REDUCTION: This exam was performed according to the departmental dose-optimization program which includes automated exposure control, adjustment of the mA and/or kV according to patient size and/or use of iterative reconstruction technique. CONTRAST:  75mL OMNIPAQUE IOHEXOL 300 MG/ML  SOLN COMPARISON:  Renal ultrasound 01/30/2022. CT 12/25/2021 without contrast. Older exams as well FINDINGS: Lower chest: There is some linear opacity lung bases likely scar or atelectasis. No  pleural effusion. Hepatobiliary: No focal liver abnormality is seen. Status post cholecystectomy. No biliary dilatation. Pancreas: Mild global atrophy of the pancreas without mass lesion. Spleen: Normal in size without focal abnormality. Adrenals/Urinary Tract: With the adrenal gland is preserved. The right is minimally thickened and nodular, unchanged from previous. Prominent bilateral renal sinus fat without collecting system dilatation. Punctate bilateral nonobstructing stones. No ureteral stones. Bilateral Bosniak 1 renal cysts. No specific imaging follow-up. The ureters have normal course and caliber down to the bladder. Preserved contour to the urinary bladder. Stomach/Bowel: On this non oral contrast exam, the stomach is nondilated with some luminal debris. There is some high attenuation material within the stomach near the lesser curve, unchanged from previous. Please correlate for a treatment clip. Small bowel is nondilated. There are several loops of small bowel particularly the ileum which has some subtle low-density within the wall, possible fat. Please correlate for any history of chronic inflammatory states. Surgical changes near the base of the cecum. The appendix is not seen. The large bowel is nondilated. Few left-sided colonic diverticula. Vascular/Lymphatic: Aortic atherosclerosis. No enlarged abdominal or pelvic lymph nodes. Reproductive: Prostate is unremarkable. Other: Small fat containing umbilical hernia. Musculoskeletal: Scattered degenerative changes of the spine and pelvis. Streak artifact related to the patient's fixation hardware along the lumbar spine. IMPRESSION: Punctate bilateral nonobstructing renal stones. Mild scattered colonic stool. Surgical changes in the right lower quadrant. The appendix is poorly seen. No pericecal stranding or fluid. Please correlate with surgical history as well. Nonspecific mild low-density along the wall of several loops of small bowel. Please correlate  for any history of chronic inflammatory state. No obstruction, free air or free fluid. Electronically Signed   By: Karen Kays M.D.   On: 11/05/2022 18:55   DG Chest 2 View  Result Date: 11/05/2022 CLINICAL DATA:  Shortness of breath with lower back pain and lower abdominal pain. EXAM: CHEST - 2 VIEW COMPARISON:  January 30, 2022 FINDINGS: The heart size and mediastinal contours are within normal limits. Low lung volumes are noted. Mild, chronic appearing increased interstitial lung markings are seen. This is very mildly increased in severity when compared to the prior study. There is no evidence of focal consolidation, pleural effusion or pneumothorax. Radiopaque surgical clips are seen within the right upper quadrant. The visualized skeletal structures are unremarkable. IMPRESSION: Very mildly increased interstitial lung markings, likely chronic in nature. A mild superimposed component of interstitial edema cannot be excluded. Electronically Signed   By: Aram Candela M.D.   On: 11/05/2022 16:10    Microbiology: Results for orders  placed or performed during the hospital encounter of 11/05/22  Blood Culture (routine x 2)     Status: None   Collection Time: 11/05/22  4:46 PM   Specimen: BLOOD  Result Value Ref Range Status   Specimen Description BLOOD LEFT ANTECUBITAL  Final   Special Requests   Final    BOTTLES DRAWN AEROBIC AND ANAEROBIC Blood Culture adequate volume   Culture   Final    NO GROWTH 5 DAYS Performed at [facility], [address] [place], [place] [address]    Report Status 11/10/2022 FINAL  Final  Urine Culture (for pregnant, neutropenic or urologic patients or patients with an indwelling urinary catheter)     Status: Abnormal   Collection Time: 11/05/22  4:48 PM   Specimen: Urine, Clean Catch  Result Value Ref Range Status   Specimen Description   Final    URINE, CLEAN CATCH Performed at [facility], [address], [place], [place] [address]     Special Requests   Final    NONE Performed at [facility], [address], [place], [place] [address]    Culture MULTIPLE SPECIES PRESENT, SUGGEST RECOLLECTION (A)  Final   Report Status 11/07/2022 FINAL  Final  Blood Culture (routine x 2)     Status: None   Collection Time: 11/05/22  5:08 PM   Specimen: BLOOD  Result Value Ref Range Status   Specimen Description BLOOD RIGHT ANTECUBITAL  Final   Special Requests   Final    BOTTLES DRAWN AEROBIC AND ANAEROBIC Blood Culture adequate volume   Culture   Final    NO GROWTH 5 DAYS Performed at [facility], [address], [place], [place] [address]    Report Status 11/10/2022 FINAL  Final  MRSA culture     Status: None   Collection Time: 11/07/22  9:55 AM   Specimen: Nose; Body Fluid  Result Value Ref Range Status   Specimen Description   Final    NOSE Performed at [facility], [address] [place], [place] [address]    Special Requests   Final    NONE Performed at [facility], [address], [place], [place] [address]    Culture   Final    NO MRSA DETECTED Performed at [facility], [address] [place], [place] [address]    Report Status 11/09/2022 FINAL  Final    Labs: CBC: Recent Labs  Lab 11/15/22 1850 11/16/22 0900  WBC 14.2* 8.9  HGB 16.3 13.6  HCT 49.1 40.1  MCV 94.4 93.9  PLT 200 139*   Basic Metabolic Panel: Recent Labs  Lab 11/15/22 1850 11/16/22 0900  NA 137  --   K 4.9  --   CL 103  --   CO2 24  --   GLUCOSE 204*  --   BUN 27*  --   CREATININE 2.03* 1.56*  CALCIUM 9.0  --    Liver Function Tests: No results for input(s): "AST", "ALT", "ALKPHOS", "BILITOT", "PROT", "ALBUMIN" in the last 168 hours. CBG: Recent Labs  Lab 11/17/22 0606  GLUCAP 128*    Discharge time spent: greater than 30 minutes.  This record has been created using Conservation officer, historic buildings. Errors have been sought and corrected,but may not always be  located. Such creation errors do not reflect on the standard of care.   Signed: Arnetha Courser, MD [facility] 11/17/2022

## 2022-11-17 NOTE — TOC Progression Note (Addendum)
Transition of Care [facility]) - Progression Note    Patient Details  Name: Devin White MRN: [id number] Date of Birth: 08/04/1955  Transition of Care [facility]) CM/SW Contact  Bing Quarry, RN Phone Number: 11/17/2022, 4:44 PM  Clinical Narrative:  11/17/22: DME home oxygen ordered via [employer] at 445 to be delivered to ED room prior to discharge. Barbie Karina Lenderman RN CM   620 pm. Oxygen DME arranged but patient did not wish to give out credit or debit card standard for place holder. Updated ED RN and [employer] after hours number given if patient changes his mind. TOC to follow up if patient still in ED or admitted. Gabriel Cirri RN CM        Expected Discharge Plan and Services         Expected Discharge Date: 11/17/22                                     Social Determinants of Health (SDOH) Interventions SDOH Screenings   Food Insecurity: No Food Insecurity (11/06/2022)  Housing: Low Risk  (11/06/2022)  Transportation Needs: No Transportation Needs (11/06/2022)  Utilities: Not At Risk (11/06/2022)  Depression (PHQ2-9): Low Risk  (07/16/2022)  Financial Resource Strain: Low Risk  (07/16/2022)  Physical Activity: Insufficiently Active (07/16/2022)  Social Connections: Socially Isolated (07/16/2022)  Stress: No Stress Concern Present (07/16/2022)  Tobacco Use: Low Risk  (11/15/2022)    Readmission Risk Interventions    11/09/2022    1:38 PM 01/31/2022    9:58 AM 11/06/2021   11:09 AM  Readmission Risk Prevention Plan  Transportation Screening Complete Complete Complete  PCP or Specialist Appt within 5-7 Days Complete  Complete  PCP or Specialist Appt within 3-5 Days  Complete   Home Care Screening Complete  Complete  Medication Review (RN CM) Referral to Pharmacy  Referral to Pharmacy  Social Work Consult for Recovery Care Planning/Counseling  Complete   Palliative Care Screening  Not Applicable   Medication Review Oceanographer)  Complete

## 2022-11-27 ENCOUNTER — Ambulatory Visit (INDEPENDENT_AMBULATORY_CARE_PROVIDER_SITE_OTHER): Payer: 59 | Admitting: Internal Medicine

## 2022-11-27 ENCOUNTER — Encounter: Payer: Self-pay | Admitting: Internal Medicine

## 2022-11-27 VITALS — BP 104/60 | HR 80 | Temp 97.1°F | Ht 73.0 in | Wt 270.0 lb

## 2022-11-27 DIAGNOSIS — T7840XD Allergy, unspecified, subsequent encounter: Secondary | ICD-10-CM | POA: Diagnosis not present

## 2022-11-27 DIAGNOSIS — I679 Cerebrovascular disease, unspecified: Secondary | ICD-10-CM | POA: Diagnosis not present

## 2022-11-27 DIAGNOSIS — D6869 Other thrombophilia: Secondary | ICD-10-CM

## 2022-11-27 DIAGNOSIS — N410 Acute prostatitis: Secondary | ICD-10-CM | POA: Diagnosis not present

## 2022-11-27 DIAGNOSIS — G8194 Hemiplegia, unspecified affecting left nondominant side: Secondary | ICD-10-CM | POA: Diagnosis not present

## 2022-11-27 DIAGNOSIS — R7989 Other specified abnormal findings of blood chemistry: Secondary | ICD-10-CM | POA: Diagnosis not present

## 2022-11-27 NOTE — Progress Notes (Signed)
Subjective:    Patient ID: Devin White, male    DOB: 03/10/1956, 67 y.o.   MRN: [id number]  HPI Here for hospital follow up  Was hospitalized 5/21---diagnosed with infection and finally thought to be prostatits Had low back pain and dysuria--but different then his usual UTI 5/23--while in bed, BP was high and got something for this Then left face got numb and couldn't move left leg/arm Got thrombolysis with TNK----but MRI was negative (and sent to ICU) Symptoms resolved within a few hours  Sent home on levaquin after tender prostate exam  Came back to ER 4 days later--trouble breathing and legs hurt bad and were tense Seen in ER and admitted overnight Did improve and switched to bactrim Slowly improved with walking--needed a walker for a while  Has mildly elevated TSH--but free T4 was normal Discussed that this can be a temporary thing  Current Outpatient Medications on File Prior to Visit  Medication Sig Dispense Refill   acetaminophen (TYLENOL) 325 MG tablet Take 650 mg by mouth every 6 (six) hours as needed for mild pain.     amLODipine (NORVASC) 10 MG tablet Take 1 tablet (10 mg total) by mouth daily. 30 tablet 0   aspirin EC 81 MG tablet Take 1 tablet (81 mg total) by mouth daily. Swallow whole. 30 tablet 0   calcium carbonate (TUMS) 500 MG chewable tablet Chew 1 tablet (200 mg of elemental calcium total) by mouth 2 (two) times daily as needed for indigestion or heartburn. 180 tablet 1   clopidogrel (PLAVIX) 75 MG tablet Take 1 tablet (75 mg total) by mouth daily for 18 days. 18 tablet 0   lisinopril (ZESTRIL) 20 MG tablet Take 1 tablet (20 mg total) by mouth daily. 30 tablet 0   Multiple Vitamin (MULTIVITAMIN WITH MINERALS) TABS tablet Take 1 tablet by mouth daily.     mycophenolate (CELLCEPT) 250 MG capsule Take 250-500 mg by mouth 2 (two) times daily. 2 capsules (500 mg) in the morning and one capsule (250 mg) in the evening     omeprazole (PRILOSEC) 20 MG capsule  Take 1 capsule (20 mg total) by mouth daily. 30 capsule 11   polyethylene glycol (MIRALAX / GLYCOLAX) 17 g packet Take 17 g by mouth daily as needed for mild constipation. 30 each 0   predniSONE (DELTASONE) 10 MG tablet Take 10 mg by mouth daily with breakfast.     rosuvastatin (CRESTOR) 20 MG tablet Take 1 tablet (20 mg total) by mouth daily. 30 tablet 0   sulfamethoxazole-trimethoprim (BACTRIM DS) 800-160 MG tablet Take 1 tablet by mouth every 12 (twelve) hours for 22 days. 44 tablet 0   tacrolimus (PROGRAF) 0.5 MG capsule Take 0.5-1 mg by mouth See admin instructions. Taking 0.5 mg in the morning and 1 mg at bedtime     tamsulosin (FLOMAX) 0.4 MG CAPS capsule TAKE 1 CAPSULE(0.4 MG) BY MOUTH DAILY (Patient taking differently: Take 0.4 mg by mouth daily.) 90 capsule 3   No current facility-administered medications on file prior to visit.    Allergies  Allergen Reactions   Levaquin [Levofloxacin] Shortness Of Breath    SOB and severe leg pain/weakness    Past Medical History:  Diagnosis Date   Arthritis    back and legs   Benign prostatic hypertrophy    CHF (congestive heart failure) (HCC)    GERD (gastroesophageal reflux disease)    Gout    History of blood transfusion    pt has antibodies  in his blood since previous transfusions   History of cirrhosis of liver S/P TRANSPLANT 2009   History of liver failure S/P TRANSPLANT   Hypertension    Left ureteral calculus    Renal disorder    Stroke [facility])     Past Surgical History:  Procedure Laterality Date   APPENDECTOMY     bone morrow biopsy     CHOLECYSTECTOMY  2007   CYSTO/ LEFT RETROGRADE PYELOGRAM/ LEFT URETERAL STENT PLACEMENT  03-05-2012  DR Berneice Heinrich [facility])   LEFT URETERAL CALCULI   CYSTOSCOPY W/ URETERAL STENT PLACEMENT  03/11/2012   Procedure: CYSTOSCOPY WITH STENT REPLACEMENT;  Surgeon: Sebastian Ache, MD;  Location: [facility];  Service: Urology;  Laterality: Left;   HERNIA REPAIR  2008   LIVER BIOPSY      LIVER TRANSPLANT  11/24/2007   pt states doing well since liver transplant   LUMBAR DISC SURGERY     LUMBAR FUSION     removal of fistula     URETEROSCOPY  03/11/2012   Procedure: URETEROSCOPY;  Surgeon: Sebastian Ache, MD;  Location: [facility];  Service: Urology;  Laterality: Left;   STONE MANIPULATION, stone obtained  [phone] [employer] MCR    Family History  Problem Relation Age of Onset   Cancer Mother        colon   Arthritis Mother    Vasculitis Mother    Hypertension Mother    Cancer Father        colon   Kidney disease Father    Arthritis Father    Arthritis Brother    Alcohol abuse Maternal Aunt    Diabetes Maternal Aunt    Alcohol abuse Maternal Uncle    Diabetes Paternal Aunt    Alcohol abuse Maternal Uncle    Alcohol abuse Maternal Uncle     Social History   Socioeconomic History   Marital status: Single    Spouse name: Not on file   Number of children: 3   Years of education: Not on file   Highest education level: Not on file  Occupational History   Occupation: disabled    Employer: RETIRED  Tobacco Use   Smoking status: Never    Passive exposure: Past   Smokeless tobacco: Never  Vaping Use   Vaping Use: Never used  Substance and Sexual Activity   Alcohol use: No   Drug use: No   Sexual activity: Not on file  Other Topics Concern   Not on file  Social History Narrative   Has living will   Requests daughter [name] as his health care POA   Would accept brief attempt at resuscitation but no prolonged ventilation   No tube feeds if cognitively unaware   Social Determinants of Health   Financial Resource Strain: Low Risk  (07/16/2022)   Overall Financial Resource Strain (CARDIA)    Difficulty of Paying Living Expenses: Not hard at all  Food Insecurity: No Food Insecurity (11/06/2022)   Hunger Vital Sign    Worried About Running Out of Food in the Last Year: Never true    Ran Out of Food in the Last Year: Never true   Transportation Needs: No Transportation Needs (11/06/2022)   PRAPARE - Administrator, Civil Service (Medical): No    Lack of Transportation (Non-Medical): No  Physical Activity: Insufficiently Active (07/16/2022)   Exercise Vital Sign    Days of Exercise per Week: 5 days    Minutes of Exercise per  Session: 20 min  Stress: No Stress Concern Present (07/16/2022)   [employer] of Occupational Health - Occupational Stress Questionnaire    Feeling of Stress : Not at all  Social Connections: Socially Isolated (07/16/2022)   Social Connection and Isolation Panel [NHANES]    Frequency of Communication with Friends and Family: More than three times a week    Frequency of Social Gatherings with Friends and Family: Once a week    Attends Religious Services: Never    Database administrator or Organizations: No    Attends Banker Meetings: Never    Marital Status: Divorced  Catering manager Violence: Not At Risk (11/06/2022)   Humiliation, Afraid, Rape, and Kick questionnaire    Fear of Current or Ex-Partner: No    Emotionally Abused: No    Physically Abused: No    Sexually Abused: No   Review of Systems Legs close to normal--still slight left leg weakness No fever Still slight dysuria and some low back pain No N/V Eating okay now    Objective:   Physical Exam Constitutional:      Appearance: Normal appearance.  Cardiovascular:     Rate and Rhythm: Normal rate and regular rhythm.     Heart sounds: No murmur heard.    No gallop.  Pulmonary:     Effort: Pulmonary effort is normal.     Breath sounds: Normal breath sounds. No wheezing or rales.  Musculoskeletal:     Cervical back: Neck supple.     Right lower leg: No edema.     Left lower leg: No edema.  Lymphadenopathy:     Cervical: No cervical adenopathy.  Skin:    Comments: Widespread ecchymoses mostly on arms  Neurological:     Mental Status: He is alert.     Comments: Abnormal gait from left leg  weakness---4/5 (where rest of extremities are 5/5)  Psychiatric:        Mood and Affect: Mood normal.        Behavior: Behavior normal.            Assessment & Plan:

## 2022-11-27 NOTE — Assessment & Plan Note (Signed)
This is probably related to his acute illness No reason for replacement now Will just have it rechecked when stable again

## 2022-11-27 NOTE — Assessment & Plan Note (Signed)
Improved after TNK but still with some left leg weakness Now on ASA 81, plavix and rosuvastatin 20 Will set up with neurology

## 2022-11-27 NOTE — Assessment & Plan Note (Signed)
Related to thrombolysis and now DAPT Will just monitor

## 2022-11-27 NOTE — Assessment & Plan Note (Signed)
To levaquin Now on his allergy list

## 2022-11-27 NOTE — Assessment & Plan Note (Signed)
Still mild symptoms Now on bactrim DS bid ---for another 3 weeks Has been referred to urology for follow up

## 2022-11-29 ENCOUNTER — Encounter: Payer: Self-pay | Admitting: *Deleted

## 2022-12-01 ENCOUNTER — Other Ambulatory Visit: Payer: Self-pay | Admitting: Internal Medicine

## 2022-12-10 DIAGNOSIS — Z881 Allergy status to other antibiotic agents status: Secondary | ICD-10-CM | POA: Diagnosis not present

## 2022-12-10 DIAGNOSIS — Z79621 Long term (current) use of calcineurin inhibitor: Secondary | ICD-10-CM | POA: Diagnosis not present

## 2022-12-10 DIAGNOSIS — R1032 Left lower quadrant pain: Secondary | ICD-10-CM | POA: Diagnosis not present

## 2022-12-10 DIAGNOSIS — Z7982 Long term (current) use of aspirin: Secondary | ICD-10-CM | POA: Diagnosis not present

## 2022-12-10 DIAGNOSIS — N390 Urinary tract infection, site not specified: Secondary | ICD-10-CM | POA: Diagnosis not present

## 2022-12-10 DIAGNOSIS — R0602 Shortness of breath: Secondary | ICD-10-CM | POA: Diagnosis not present

## 2022-12-10 DIAGNOSIS — I5032 Chronic diastolic (congestive) heart failure: Secondary | ICD-10-CM | POA: Diagnosis not present

## 2022-12-10 DIAGNOSIS — Z8673 Personal history of transient ischemic attack (TIA), and cerebral infarction without residual deficits: Secondary | ICD-10-CM | POA: Diagnosis not present

## 2022-12-10 DIAGNOSIS — Z86718 Personal history of other venous thrombosis and embolism: Secondary | ICD-10-CM | POA: Diagnosis not present

## 2022-12-10 DIAGNOSIS — N183 Chronic kidney disease, stage 3 unspecified: Secondary | ICD-10-CM | POA: Diagnosis not present

## 2022-12-10 DIAGNOSIS — I69354 Hemiplegia and hemiparesis following cerebral infarction affecting left non-dominant side: Secondary | ICD-10-CM | POA: Diagnosis not present

## 2022-12-10 DIAGNOSIS — R6520 Severe sepsis without septic shock: Secondary | ICD-10-CM | POA: Diagnosis not present

## 2022-12-10 DIAGNOSIS — K5731 Diverticulosis of large intestine without perforation or abscess with bleeding: Secondary | ICD-10-CM | POA: Diagnosis not present

## 2022-12-10 DIAGNOSIS — M4802 Spinal stenosis, cervical region: Secondary | ICD-10-CM | POA: Diagnosis not present

## 2022-12-10 DIAGNOSIS — Z944 Liver transplant status: Secondary | ICD-10-CM | POA: Diagnosis not present

## 2022-12-10 DIAGNOSIS — E871 Hypo-osmolality and hyponatremia: Secondary | ICD-10-CM | POA: Diagnosis not present

## 2022-12-10 DIAGNOSIS — R42 Dizziness and giddiness: Secondary | ICD-10-CM | POA: Diagnosis not present

## 2022-12-10 DIAGNOSIS — K649 Unspecified hemorrhoids: Secondary | ICD-10-CM | POA: Diagnosis not present

## 2022-12-10 DIAGNOSIS — I5030 Unspecified diastolic (congestive) heart failure: Secondary | ICD-10-CM | POA: Diagnosis not present

## 2022-12-10 DIAGNOSIS — K922 Gastrointestinal hemorrhage, unspecified: Secondary | ICD-10-CM | POA: Diagnosis not present

## 2022-12-10 DIAGNOSIS — Z79899 Other long term (current) drug therapy: Secondary | ICD-10-CM | POA: Diagnosis not present

## 2022-12-10 DIAGNOSIS — M1A00X0 Idiopathic chronic gout, unspecified site, without tophus (tophi): Secondary | ICD-10-CM | POA: Diagnosis not present

## 2022-12-10 DIAGNOSIS — M1A9XX1 Chronic gout, unspecified, with tophus (tophi): Secondary | ICD-10-CM | POA: Diagnosis not present

## 2022-12-10 DIAGNOSIS — D84821 Immunodeficiency due to drugs: Secondary | ICD-10-CM | POA: Diagnosis not present

## 2022-12-10 DIAGNOSIS — I130 Hypertensive heart and chronic kidney disease with heart failure and stage 1 through stage 4 chronic kidney disease, or unspecified chronic kidney disease: Secondary | ICD-10-CM | POA: Diagnosis not present

## 2022-12-10 DIAGNOSIS — A4189 Other specified sepsis: Secondary | ICD-10-CM | POA: Diagnosis not present

## 2022-12-10 DIAGNOSIS — N179 Acute kidney failure, unspecified: Secondary | ICD-10-CM | POA: Diagnosis not present

## 2022-12-10 DIAGNOSIS — K219 Gastro-esophageal reflux disease without esophagitis: Secondary | ICD-10-CM | POA: Diagnosis not present

## 2022-12-10 DIAGNOSIS — M1A9XX0 Chronic gout, unspecified, without tophus (tophi): Secondary | ICD-10-CM | POA: Diagnosis not present

## 2022-12-10 DIAGNOSIS — I129 Hypertensive chronic kidney disease with stage 1 through stage 4 chronic kidney disease, or unspecified chronic kidney disease: Secondary | ICD-10-CM | POA: Diagnosis not present

## 2022-12-10 DIAGNOSIS — Z87440 Personal history of urinary (tract) infections: Secondary | ICD-10-CM | POA: Diagnosis not present

## 2022-12-11 DIAGNOSIS — K922 Gastrointestinal hemorrhage, unspecified: Secondary | ICD-10-CM | POA: Diagnosis not present

## 2022-12-11 DIAGNOSIS — M1A00X0 Idiopathic chronic gout, unspecified site, without tophus (tophi): Secondary | ICD-10-CM | POA: Diagnosis not present

## 2022-12-11 DIAGNOSIS — N183 Chronic kidney disease, stage 3 unspecified: Secondary | ICD-10-CM | POA: Diagnosis not present

## 2022-12-12 ENCOUNTER — Telehealth: Payer: 59

## 2022-12-12 DIAGNOSIS — K922 Gastrointestinal hemorrhage, unspecified: Secondary | ICD-10-CM | POA: Diagnosis not present

## 2022-12-12 DIAGNOSIS — N183 Chronic kidney disease, stage 3 unspecified: Secondary | ICD-10-CM | POA: Diagnosis not present

## 2022-12-12 DIAGNOSIS — M1A00X0 Idiopathic chronic gout, unspecified site, without tophus (tophi): Secondary | ICD-10-CM | POA: Diagnosis not present

## 2022-12-13 DIAGNOSIS — N183 Chronic kidney disease, stage 3 unspecified: Secondary | ICD-10-CM | POA: Diagnosis not present

## 2022-12-13 DIAGNOSIS — M1A00X0 Idiopathic chronic gout, unspecified site, without tophus (tophi): Secondary | ICD-10-CM | POA: Diagnosis not present

## 2022-12-13 DIAGNOSIS — K922 Gastrointestinal hemorrhage, unspecified: Secondary | ICD-10-CM | POA: Diagnosis not present

## 2022-12-16 ENCOUNTER — Telehealth: Payer: Self-pay | Admitting: Internal Medicine

## 2022-12-16 NOTE — Telephone Encounter (Signed)
Pt called stating he was told by [employer] that they Dr. Alphonsus Sias to call & cancel the oxygen order he submitted for the pt. Pt stated he was told by Alphonsus Sias that he didn't need the oxygen machine. Fax # is [phone]. Call back # [phone]

## 2022-12-17 ENCOUNTER — Telehealth: Payer: Self-pay | Admitting: *Deleted

## 2022-12-17 DIAGNOSIS — R079 Chest pain, unspecified: Secondary | ICD-10-CM | POA: Diagnosis not present

## 2022-12-17 DIAGNOSIS — I509 Heart failure, unspecified: Secondary | ICD-10-CM | POA: Diagnosis not present

## 2022-12-17 NOTE — Transitions of Care (Post Inpatient/ED Visit) (Signed)
12/17/2022  Name: Devin White MRN: [id number] DOB: 12/10/1955  Today's TOC FU Call Status: Today's TOC FU Call Status:: Successful TOC FU Call Competed TOC FU Call Complete Date: 12/17/22  Transition Care Management Follow-up Telephone Call Date of Discharge: 12/13/22 Discharge Facility: Other ([facility]) Name of Other ([facility]) Discharge Facility: [facility] Type of Discharge: Inpatient Admission Primary Inpatient Discharge Diagnosis:: Sepsis How have you been since you were released from the hospital?: Better Any questions or concerns?: No  Items Reviewed: Did you receive and understand the discharge instructions provided?: Yes Medications obtained,verified, and reconciled?: Yes (Medications Reviewed) Any new allergies since your discharge?: No Dietary orders reviewed?: No Do you have support at home?: Yes People in Home: alone Name of Support/Comfort Primary Source: Melissa  Medications Reviewed Today: Medications Reviewed Today     Reviewed by Luella Cook, RN (Case Manager) on 12/17/22 at 1103  Med List Status: <None>   Medication Order Taking? Sig Documenting Provider Last Dose Status Informant  acetaminophen (TYLENOL) 325 MG tablet [id number] Yes Take 650 mg by mouth every 6 (six) hours as needed for mild pain. [provider] Taking Active Self  amLODipine (NORVASC) 10 MG tablet [id number] Yes Take 1 tablet (10 mg total) by mouth daily. Alford Highland, MD Taking Active   aspirin EC 81 MG tablet [id number] Yes Take 1 tablet (81 mg total) by mouth daily. Swallow whole. Alford Highland, MD Taking Active   calcium carbonate (TUMS) 500 MG chewable tablet [id number] Yes Chew 1 tablet (200 mg of elemental calcium total) by mouth 2 (two) times daily as needed for indigestion or heartburn. Joaquim Nam, MD Taking Active Self  lisinopril (ZESTRIL) 20 MG tablet [id number] Yes Take 1 tablet (20 mg total) by mouth daily. Alford Highland, MD Taking Active    Multiple Vitamin (MULTIVITAMIN WITH MINERALS) TABS tablet [id number] Yes Take 1 tablet by mouth daily. Kathlen Mody, MD Taking Active Self  mycophenolate (CELLCEPT) 250 MG capsule [id number] Yes Take 250-500 mg by mouth 2 (two) times daily. 2 capsules (500 mg) in the morning and one capsule (250 mg) in the evening [provider] Taking Active Self  omeprazole (PRILOSEC) 20 MG capsule [id number] Yes Take 1 capsule (20 mg total) by mouth daily. Karie Schwalbe, MD Taking Active Self  polyethylene glycol (MIRALAX / GLYCOLAX) 17 g packet [id number] Yes Take 17 g by mouth daily as needed for mild constipation. Alford Highland, MD Taking Active   predniSONE (DELTASONE) 10 MG tablet [id number] Yes Take 10 mg by mouth daily with breakfast. [provider] Taking Active Self  rosuvastatin (CRESTOR) 20 MG tablet [id number] Yes Take 1 tablet (20 mg total) by mouth daily. Alford Highland, MD Taking Active   tacrolimus (PROGRAF) 0.5 MG capsule [id number] Yes Take 0.5-1 mg by mouth See admin instructions. Taking 0.5 mg in the morning and 1 mg at bedtime [provider] Taking Active Self  tamsulosin (FLOMAX) 0.4 MG CAPS capsule [id number] Yes TAKE 1 CAPSULE(0.4 MG) BY MOUTH DAILY Karie Schwalbe, MD Taking Active             Home Care and Equipment/Supplies: Were Home Health Services Ordered?: NA Any new equipment or medical supplies ordered?: NA  Functional Questionnaire: Do you need assistance with bathing/showering or dressing?: No Do you need assistance with meal preparation?: No Do you need assistance with eating?: No Do you have difficulty maintaining continence: No Do you need assistance with getting out of bed/getting out of a chair/moving?: No  Follow up appointments reviewed: PCP Follow-up appointment confirmed?: Yes Date of PCP follow-up appointment?: 12/27/22 Follow-up Provider: Dr Alphonsus Sias [facility] Follow-up appointment confirmed?: Yes Date of  Specialist follow-up appointment?: 12/23/22 Follow-Up Specialty Provider:: [id number] 12/23/2022 11:15 AM Malvin Johns, Cleotis Nipper, MD [facility] , 02/04/2023 2:20 PM Midge Aver, MD DS RHEUMATOL [facility]  02/10/2023 1:30 PM Sharmon Revere, NP DSUROLOGY [facility],  04/21/2023 9:00 AM Velta Addison, MD LIVERINTTP [facility] Do you need transportation to your follow-up appointment?: No Do you understand care options if your condition(s) worsen?: Yes-patient verbalized understanding  SDOH Interventions Today    Flowsheet Row Most Recent Value  SDOH Interventions   Food Insecurity Interventions Intervention Not Indicated  Housing Interventions Intervention Not Indicated  Transportation Interventions Intervention Not Indicated      Interventions Today    Flowsheet Row Most Recent Value  General Interventions   General Interventions Discussed/Reviewed General Interventions Discussed, General Interventions Reviewed, Doctor Visits  Doctor Visits Discussed/Reviewed Doctor Visits Discussed, Doctor Visits Reviewed  [facility] went over upcoming appointments]  Pharmacy Interventions   Pharmacy Dicussed/Reviewed Pharmacy Topics Discussed  [per patient he has all is medications]      TOC Interventions Today    Flowsheet Row Most Recent Value  TOC Interventions   TOC Interventions Discussed/Reviewed TOC Interventions Discussed, TOC Interventions Reviewed       Gean Maidens BSN RN [facility] [phone]

## 2022-12-18 ENCOUNTER — Telehealth: Payer: Self-pay | Admitting: *Deleted

## 2022-12-18 NOTE — Telephone Encounter (Signed)
Most recent OV note does not discuss oxygen. Will forward to Dr Karle Starch box to address upon his return next week. He has been in the hospital, again, since his last OV.

## 2022-12-18 NOTE — Telephone Encounter (Signed)
   CCM RN Visit Note   12/18/22 Name: Devin White MRN: [id number]      DOB: [birth year]  Subjective: Devin White is a 67 y.o. year old male who is a primary care patient of Tillman Abide MD. The patient was referred to the Chronic Care Management team for assistance with care management needs subsequent to provider initiation of CCM services and plan of care.      Telephone call to schedule follow up telephone assessment, spoke with patient, scheduled for 12/30/22.  Plan:Telephone follow up appointment with care management team member scheduled for:  12/30/22 at 945 am  Irving Shows [facility], BSN RN Case Manager [facility] [phone]

## 2022-12-23 DIAGNOSIS — Z7689 Persons encountering health services in other specified circumstances: Secondary | ICD-10-CM | POA: Diagnosis not present

## 2022-12-23 DIAGNOSIS — R531 Weakness: Secondary | ICD-10-CM | POA: Diagnosis not present

## 2022-12-23 DIAGNOSIS — R200 Anesthesia of skin: Secondary | ICD-10-CM | POA: Diagnosis not present

## 2022-12-23 DIAGNOSIS — R29818 Other symptoms and signs involving the nervous system: Secondary | ICD-10-CM | POA: Diagnosis not present

## 2022-12-23 DIAGNOSIS — R202 Paresthesia of skin: Secondary | ICD-10-CM | POA: Diagnosis not present

## 2022-12-23 DIAGNOSIS — R519 Headache, unspecified: Secondary | ICD-10-CM | POA: Diagnosis not present

## 2022-12-27 ENCOUNTER — Ambulatory Visit (INDEPENDENT_AMBULATORY_CARE_PROVIDER_SITE_OTHER): Payer: 59 | Admitting: Internal Medicine

## 2022-12-27 ENCOUNTER — Encounter: Payer: Self-pay | Admitting: Internal Medicine

## 2022-12-27 VITALS — BP 110/86 | HR 89 | Ht 73.0 in | Wt 270.0 lb

## 2022-12-27 DIAGNOSIS — Z944 Liver transplant status: Secondary | ICD-10-CM

## 2022-12-27 DIAGNOSIS — N1831 Chronic kidney disease, stage 3a: Secondary | ICD-10-CM

## 2022-12-27 DIAGNOSIS — G8194 Hemiplegia, unspecified affecting left nondominant side: Secondary | ICD-10-CM

## 2022-12-27 DIAGNOSIS — N390 Urinary tract infection, site not specified: Secondary | ICD-10-CM | POA: Diagnosis not present

## 2022-12-27 DIAGNOSIS — K226 Gastro-esophageal laceration-hemorrhage syndrome: Secondary | ICD-10-CM | POA: Diagnosis not present

## 2022-12-27 DIAGNOSIS — I679 Cerebrovascular disease, unspecified: Secondary | ICD-10-CM | POA: Diagnosis not present

## 2022-12-27 DIAGNOSIS — I10 Essential (primary) hypertension: Secondary | ICD-10-CM | POA: Diagnosis not present

## 2022-12-27 MED ORDER — LISINOPRIL 20 MG PO TABS: 20.0000 mg | ORAL_TABLET | Freq: Every day | ORAL | 3 refills | Status: DC

## 2022-12-27 MED ORDER — ROSUVASTATIN CALCIUM 20 MG PO TABS: 20.0000 mg | ORAL_TABLET | Freq: Every day | ORAL | 3 refills | Status: DC

## 2022-12-27 NOTE — Assessment & Plan Note (Signed)
Continues on the mycophenolate, tacrolimus and prednisone

## 2022-12-27 NOTE — Progress Notes (Signed)
Subjective:    Patient ID: Devin White, male    DOB: 1956/02/16, 67 y.o.   MRN: [id number]  HPI Here for follow up after possible stroke  Feeling some better He did see Dr Willa Rough neurologist Question of complicated migraine----but still has left side weakness Scheduling EMG  No fever No recent urinary symptoms  Got oxygen on hospital discharge Doesn't need this  Last GFR 49  Current Outpatient Medications on File Prior to Visit  Medication Sig Dispense Refill   acetaminophen (TYLENOL) 325 MG tablet Take 650 mg by mouth every 6 (six) hours as needed for mild pain.     aspirin EC 81 MG tablet Take 1 tablet (81 mg total) by mouth daily. Swallow whole. 30 tablet 0   calcium carbonate (TUMS) 500 MG chewable tablet Chew 1 tablet (200 mg of elemental calcium total) by mouth 2 (two) times daily as needed for indigestion or heartburn. 180 tablet 1   Multiple Vitamin (MULTIVITAMIN WITH MINERALS) TABS tablet Take 1 tablet by mouth daily.     mycophenolate (CELLCEPT) 250 MG capsule Take 250-500 mg by mouth 2 (two) times daily. 2 capsules (500 mg) in the morning and one capsule (250 mg) in the evening     omeprazole (PRILOSEC) 20 MG capsule Take 1 capsule (20 mg total) by mouth daily. 30 capsule 11   polyethylene glycol (MIRALAX / GLYCOLAX) 17 g packet Take 17 g by mouth daily as needed for mild constipation. 30 each 0   predniSONE (DELTASONE) 10 MG tablet Take 10 mg by mouth daily with breakfast.     tacrolimus (PROGRAF) 0.5 MG capsule Take 0.5-1 mg by mouth See admin instructions. Taking 0.5 mg in the morning and 1 mg at bedtime     tamsulosin (FLOMAX) 0.4 MG CAPS capsule TAKE 1 CAPSULE(0.4 MG) BY MOUTH DAILY 90 capsule 3   No current facility-administered medications on file prior to visit.    Allergies  Allergen Reactions   Levaquin [Levofloxacin] Shortness Of Breath    SOB and severe leg pain/weakness    Past Medical History:  Diagnosis Date   Arthritis    back and  legs   Benign prostatic hypertrophy    CHF (congestive heart failure) (HCC)    GERD (gastroesophageal reflux disease)    Gout    History of blood transfusion    pt has antibodies in his blood since previous transfusions   History of cirrhosis of liver S/P TRANSPLANT 2009   History of liver failure S/P TRANSPLANT   Hypertension    Left ureteral calculus    Renal disorder    Stroke [facility])     Past Surgical History:  Procedure Laterality Date   APPENDECTOMY     bone morrow biopsy     CHOLECYSTECTOMY  2007   CYSTO/ LEFT RETROGRADE PYELOGRAM/ LEFT URETERAL STENT PLACEMENT  03-05-2012  DR Berneice Heinrich [facility])   LEFT URETERAL CALCULI   CYSTOSCOPY W/ URETERAL STENT PLACEMENT  03/11/2012   Procedure: CYSTOSCOPY WITH STENT REPLACEMENT;  Surgeon: Sebastian Ache, MD;  Location: [facility];  Service: Urology;  Laterality: Left;   HERNIA REPAIR  2008   LIVER BIOPSY     LIVER TRANSPLANT  11/24/2007   pt states doing well since liver transplant   LUMBAR DISC SURGERY     LUMBAR FUSION     removal of fistula     URETEROSCOPY  03/11/2012   Procedure: URETEROSCOPY;  Surgeon: Sebastian Ache, MD;  Location: [facility];  Service: Urology;  Laterality: Left;   STONE MANIPULATION, stone obtained  [phone] [employer] MCR    Family History  Problem Relation Age of Onset   Cancer Mother        colon   Arthritis Mother    Vasculitis Mother    Hypertension Mother    Cancer Father        colon   Kidney disease Father    Arthritis Father    Arthritis Brother    Alcohol abuse Maternal Aunt    Diabetes Maternal Aunt    Alcohol abuse Maternal Uncle    Diabetes Paternal Aunt    Alcohol abuse Maternal Uncle    Alcohol abuse Maternal Uncle     Social History   Socioeconomic History   Marital status: Single    Spouse name: Not on file   Number of children: 3   Years of education: Not on file   Highest education level: 9th grade  Occupational History   Occupation: disabled     Employer: RETIRED  Tobacco Use   Smoking status: Never    Passive exposure: Past   Smokeless tobacco: Never  Vaping Use   Vaping status: Never Used  Substance and Sexual Activity   Alcohol use: No   Drug use: No   Sexual activity: Not on file  Other Topics Concern   Not on file  Social History Narrative   Has living will   Requests daughter [name] as his health care POA   Would accept brief attempt at resuscitation but no prolonged ventilation   No tube feeds if cognitively unaware   Social Determinants of Health   Financial Resource Strain: Medium Risk (12/23/2022)   Overall Financial Resource Strain (CARDIA)    Difficulty of Paying Living Expenses: Somewhat hard  Food Insecurity: No Food Insecurity (12/23/2022)   Hunger Vital Sign    Worried About Running Out of Food in the Last Year: Never true    Ran Out of Food in the Last Year: Never true  Transportation Needs: No Transportation Needs (12/23/2022)   PRAPARE - Administrator, Civil Service (Medical): No    Lack of Transportation (Non-Medical): No  Physical Activity: Insufficiently Active (12/23/2022)   Exercise Vital Sign    Days of Exercise per Week: 2 days    Minutes of Exercise per Session: 10 min  Stress: No Stress Concern Present (12/23/2022)   [employer] of Occupational Health - Occupational Stress Questionnaire    Feeling of Stress : Not at all  Social Connections: Unknown (12/23/2022)   Social Connection and Isolation Panel [NHANES]    Frequency of Communication with Friends and Family: More than three times a week    Frequency of Social Gatherings with Friends and Family: Once a week    Attends Religious Services: Patient declined    Database administrator or Organizations: No    Attends Banker Meetings: Never    Marital Status: Divorced  Catering manager Violence: Not At Risk (11/06/2022)   Humiliation, Afraid, Rape, and Kick questionnaire    Fear of Current or Ex-Partner: No     Emotionally Abused: No    Physically Abused: No    Sexually Abused: No   Review of Systems Eating well Sleeping better     Objective:   Physical Exam Constitutional:      Appearance: Normal appearance.  Cardiovascular:     Rate and Rhythm: Normal rate and regular rhythm.     Heart sounds:  No murmur heard.    No gallop.  Pulmonary:     Effort: Pulmonary effort is normal.     Breath sounds: Normal breath sounds. No wheezing or rales.  Musculoskeletal:     Cervical back: Neck supple.     Right lower leg: No edema.     Left lower leg: No edema.  Lymphadenopathy:     Cervical: No cervical adenopathy.  Neurological:     Mental Status: He is alert.     Comments: Still subtle left sided weakness            Assessment & Plan:

## 2022-12-27 NOTE — Addendum Note (Signed)
Addended by: Tillman Abide I on: 12/27/2022 10:28 AM   Modules accepted: Orders

## 2022-12-27 NOTE — Assessment & Plan Note (Signed)
[facility] was considering repeat EGD and colon but bleeding resolved Hold off for now

## 2022-12-27 NOTE — Assessment & Plan Note (Signed)
No recent symptoms

## 2022-12-27 NOTE — Assessment & Plan Note (Signed)
Subtle weakness still Uncertain about stroke--since MRI normal Will continue ASA and statin though

## 2022-12-27 NOTE — Assessment & Plan Note (Signed)
BP Readings from Last 3 Encounters:  12/27/22 110/86  11/27/22 104/60  11/17/22 134/82   BP now better Ran out of lisinopril 20--but will refill

## 2022-12-27 NOTE — Assessment & Plan Note (Signed)
Has been stable

## 2022-12-30 ENCOUNTER — Ambulatory Visit (INDEPENDENT_AMBULATORY_CARE_PROVIDER_SITE_OTHER): Payer: 59 | Admitting: *Deleted

## 2022-12-30 NOTE — Patient Instructions (Signed)
Please call the care guide team at [phone] if you need to cancel or reschedule your appointment.   If you are experiencing a Mental Health or Behavioral Health Crisis or need someone to talk to, please call the Suicide and Crisis Lifeline: 988 call the [place] National Suicide Prevention Lifeline: (240)227-3445 or TTY: (934)320-8791 TTY 6266641999) to talk to a trained counselor call 1-800-273-TALK (toll free, 24 hour hotline) go to [facility] [address], [place] [phone]) call 911   Following is a copy of the CCM Program Consent:  CCM service includes personalized support from designated clinical staff supervised by the physician, including individualized plan of care and coordination with other care providers 24/7 contact phone numbers for assistance for urgent and routine care needs. Service will only be billed when office clinical staff spend 20 minutes or more in a month to coordinate care. Only one practitioner may furnish and bill the service in a calendar month. The patient may stop CCM services at amy time (effective at the end of the month) by phone call to the office staff. The patient will be responsible for cost sharing (co-pay) or up to 20% of the service fee (after annual deductible is met)  Following is a copy of your full provider care plan:   Goals Addressed             This Visit's Progress    COMPLETED: CCM (CHRONIC KIDNEY DISEASE) EXPECTED OUTCOME: MONITOR, SELF-MANAGE AND REDUCE SYMPTOMS OF CHRONIC KIDNEY DISEASE       Current Barriers:  Knowledge Deficits related to Chronic Kidney disease management Chronic Disease Management support and education needs related to Chronic Kidney disease No Advanced Directives in place- pt states he did not receive documents, requests to be remailed Patient reports he had a stroke last year and "I'm doing so much better" Patient reports he has had no further GI bleeding and  endoscopy cancelled Patient reports "kidney issue is better for now, no UTI"  pt to follow up with [facility]rologist at [facility] in August, pt reports he drinks adequate fluids (mostly water), pt does not feel he has further needs for care management  Planned Interventions: Assessed the patient     understanding of chronic kidney disease    Evaluation of current treatment plan related to chronic kidney disease self management and patient's adherence to plan as established by provider      Reviewed medications with patient and discussed importance of compliance    Counseled on the importance of exercise goals with target of 150 minutes per week     Discussed complications of poorly controlled blood pressure such as heart disease, stroke, circulatory complications, vision complications, kidney impairment, sexual dysfunction    Reviewed low sodium diet and importance of reading labels Reviewed signs/ symptoms UTI and prevention Reviewed plan of care with patient, including case closure  Symptom Management: Take medications as prescribed   Attend all scheduled provider appointments Call pharmacy for medication refills 3-7 days in advance of running out of medications Attend church or other social activities Perform all self care activities independently  Perform IADL's (shopping, preparing meals, housekeeping, managing finances) independently Call provider office for new concerns or questions  Advanced directives packet mailed- please look over and complete Read food labels for sodium content  Follow Up Plan: No further follow up required: Case closed          COMPLETED: CCM (HYPERTENSION) EXPECTED OUTCOME: MONITOR, SELF-MANAGE AND REDUCE SYMPTOMS OF HYPERTENSION  Current Barriers:  Knowledge Deficits related to Hypertension management Chronic Disease Management support and education needs related to Hypertension, diet No Advanced Directives in place- pt requests information be mailed Patient  reports he lives alone, is independent in all aspects of his care, continues to drive, pt received liver transplant in 2009 and is managed by [facility] Patient reports he checks blood pressure several times per week and readings "have been ok"  Planned Interventions: Evaluation of current treatment plan related to hypertension self management and patient's adherence to plan as established by provider;   Reviewed medications with patient and discussed importance of compliance;  Counseled on the importance of exercise goals with target of 150 minutes per week Advised patient, providing education and rationale, to monitor blood pressure daily and record, calling PCP for findings outside established parameters;  Advised patient to discuss any issues with blood pressure, medications, change in health status with provider; Discussed complications of poorly controlled blood pressure such as heart disease, stroke, circulatory complications, vision complications, kidney impairment, sexual dysfunction;   Symptom Management: Take medications as prescribed   Attend all scheduled provider appointments Call pharmacy for medication refills 3-7 days in advance of running out of medications Attend church or other social activities Perform all self care activities independently  Perform IADL's (shopping, preparing meals, housekeeping, managing finances) independently Call provider office for new concerns or questions  check blood pressure 3 times per week choose a place to take my blood pressure (home, clinic or office, retail store) write blood pressure results in a log or diary learn about high blood pressure keep a blood pressure log take blood pressure log to all doctor appointments call doctor for signs and symptoms of high blood pressure develop an action plan for high blood pressure keep all doctor appointments take medications for blood pressure exactly as prescribed report new symptoms to your  doctor eat more whole grains, fruits and vegetables, lean meats and healthy fats Follow low sodium diet- avoid/ limit fast food Follow up with urologist at [facility] Case closure  Follow Up Plan: No further follow up required: case closure             Patient verbalizes understanding of instructions and care plan provided today and agrees to view in MyChart. Active MyChart status and patient understanding of how to access instructions and care plan via MyChart confirmed with patient.  No further follow up required: Case closed

## 2022-12-30 NOTE — Chronic Care Management (AMB) (Signed)
Chronic Care Management   CCM RN Visit Note  12/30/2022 Name: Devin White MRN: [id number] DOB: 09-18-1955  Subjective: Devin White is a 67 y.o. year old male who is a primary care patient of Karie Schwalbe, MD. The patient was referred to the Chronic Care Management team for assistance with care management needs subsequent to provider initiation of CCM services and plan of care.    Today's Visit:  Engaged with patient by telephone for follow up visit.        Goals Addressed             This Visit's Progress    COMPLETED: CCM (CHRONIC KIDNEY DISEASE) EXPECTED OUTCOME: MONITOR, SELF-MANAGE AND REDUCE SYMPTOMS OF CHRONIC KIDNEY DISEASE       Current Barriers:  Knowledge Deficits related to Chronic Kidney disease management Chronic Disease Management support and education needs related to Chronic Kidney disease No Advanced Directives in place- pt states he did not receive documents, requests to be remailed Patient reports he had a stroke last year and "I'm doing so much better" Patient reports he has had no further GI bleeding and endoscopy cancelled Patient reports "kidney issue is better for now, no UTI"  pt to follow up with urologist at [facility] in August, pt reports he drinks adequate fluids (mostly water), pt does not feel he has further needs for care management  Planned Interventions: Assessed the patient     understanding of chronic kidney disease    Evaluation of current treatment plan related to chronic kidney disease self management and patient's adherence to plan as established by provider      Reviewed medications with patient and discussed importance of compliance    Counseled on the importance of exercise goals with target of 150 minutes per week     Discussed complications of poorly controlled blood pressure such as heart disease, stroke, circulatory complications, vision complications, kidney impairment, sexual dysfunction    Reviewed low sodium diet and  importance of reading labels Reviewed signs/ symptoms UTI and prevention Reviewed plan of care with patient, including case closure  Symptom Management: Take medications as prescribed   Attend all scheduled provider appointments Call pharmacy for medication refills 3-7 days in advance of running out of medications Attend church or other social activities Perform all self care activities independently  Perform IADL's (shopping, preparing meals, housekeeping, managing finances) independently Call provider office for new concerns or questions  Advanced directives packet mailed- please look over and complete Read food labels for sodium content  Follow Up Plan: No further follow up required: Case closed          COMPLETED: CCM (HYPERTENSION) EXPECTED OUTCOME: MONITOR, SELF-MANAGE AND REDUCE SYMPTOMS OF HYPERTENSION       Current Barriers:  Knowledge Deficits related to Hypertension management Chronic Disease Management support and education needs related to Hypertension, diet No Advanced Directives in place- pt requests information be mailed Patient reports he lives alone, is independent in all aspects of his care, continues to drive, pt received liver transplant in 2009 and is managed by [facility] Patient reports he checks blood pressure several times per week and readings "have been ok"  Planned Interventions: Evaluation of current treatment plan related to hypertension self management and patient's adherence to plan as established by provider;   Reviewed medications with patient and discussed importance of compliance;  Counseled on the importance of exercise goals with target of 150 minutes per week Advised patient, providing education and rationale, to monitor  blood pressure daily and record, calling PCP for findings outside established parameters;  Advised patient to discuss any issues with blood pressure, medications, change in health status with provider; Discussed complications of  poorly controlled blood pressure such as heart disease, stroke, circulatory complications, vision complications, kidney impairment, sexual dysfunction;   Symptom Management: Take medications as prescribed   Attend all scheduled provider appointments Call pharmacy for medication refills 3-7 days in advance of running out of medications Attend church or other social activities Perform all self care activities independently  Perform IADL's (shopping, preparing meals, housekeeping, managing finances) independently Call provider office for new concerns or questions  check blood pressure 3 times per week choose a place to take my blood pressure (home, clinic or office, retail store) write blood pressure results in a log or diary learn about high blood pressure keep a blood pressure log take blood pressure log to all doctor appointments call doctor for signs and symptoms of high blood pressure develop an action plan for high blood pressure keep all doctor appointments take medications for blood pressure exactly as prescribed report new symptoms to your doctor eat more whole grains, fruits and vegetables, lean meats and healthy fats Follow low sodium diet- avoid/ limit fast food Follow up with urologist at [facility] Case closure  Follow Up Plan: No further follow up required: case closure             Plan:No further follow up required: case closure  Irving Shows [facility], BSN RN Case Manager [facility] [place] [phone]

## 2023-01-03 DIAGNOSIS — M25552 Pain in left hip: Secondary | ICD-10-CM | POA: Diagnosis not present

## 2023-01-03 DIAGNOSIS — R200 Anesthesia of skin: Secondary | ICD-10-CM | POA: Diagnosis not present

## 2023-01-03 DIAGNOSIS — M5416 Radiculopathy, lumbar region: Secondary | ICD-10-CM | POA: Diagnosis not present

## 2023-01-03 DIAGNOSIS — G8929 Other chronic pain: Secondary | ICD-10-CM | POA: Diagnosis not present

## 2023-01-03 DIAGNOSIS — R202 Paresthesia of skin: Secondary | ICD-10-CM | POA: Diagnosis not present

## 2023-01-15 DIAGNOSIS — I129 Hypertensive chronic kidney disease with stage 1 through stage 4 chronic kidney disease, or unspecified chronic kidney disease: Secondary | ICD-10-CM | POA: Diagnosis not present

## 2023-01-15 DIAGNOSIS — N189 Chronic kidney disease, unspecified: Secondary | ICD-10-CM | POA: Diagnosis not present

## 2023-01-20 DIAGNOSIS — M5412 Radiculopathy, cervical region: Secondary | ICD-10-CM | POA: Diagnosis not present

## 2023-01-20 DIAGNOSIS — M9931 Osseous stenosis of neural canal of cervical region: Secondary | ICD-10-CM | POA: Diagnosis not present

## 2023-01-20 DIAGNOSIS — M542 Cervicalgia: Secondary | ICD-10-CM | POA: Diagnosis not present

## 2023-01-28 ENCOUNTER — Other Ambulatory Visit: Payer: Self-pay | Admitting: Internal Medicine

## 2023-02-03 ENCOUNTER — Telehealth: Payer: Self-pay | Admitting: Internal Medicine

## 2023-02-03 DIAGNOSIS — M5412 Radiculopathy, cervical region: Secondary | ICD-10-CM | POA: Diagnosis not present

## 2023-02-03 NOTE — Telephone Encounter (Signed)
FYI: This call has been transferred to Access Nurse. Once the result note has been entered staff can address the message at that time.  Patient called in with the following symptoms:  Red Word:elevated blood pressure Patient called in and stated that he has been dealing with elevated blood pressure for 2 weeks. He had an appointment on today and his pressure was 185/105 and he was experiencing some lightheadedness. He stated that his blood pressure medication isn't helping anymore.   Please advise at Mobile [phone] (mobile)  Message is routed to Provider Pool and [facility] Triage

## 2023-02-03 NOTE — Telephone Encounter (Signed)
In spoke with pt and he is waiting on his granddaughter to come and pick him up now. Pt said tingling and numbness in lt arm and shoulder is worsening and pt mhas blurred vision in lt eye with h/a. UC & ED precautions given and pt voiced understanding. Pt is going to [facility] ED when granddaughter gets there.sending note to Dr Alphonsus Sias and [facility] Sias pool.

## 2023-02-04 DIAGNOSIS — R531 Weakness: Secondary | ICD-10-CM | POA: Diagnosis not present

## 2023-02-04 DIAGNOSIS — R000 Tachycardia, unspecified: Secondary | ICD-10-CM | POA: Diagnosis not present

## 2023-02-04 DIAGNOSIS — R9431 Abnormal electrocardiogram [ECG] [EKG]: Secondary | ICD-10-CM | POA: Diagnosis not present

## 2023-02-04 DIAGNOSIS — G43909 Migraine, unspecified, not intractable, without status migrainosus: Secondary | ICD-10-CM | POA: Diagnosis not present

## 2023-02-04 DIAGNOSIS — I110 Hypertensive heart disease with heart failure: Secondary | ICD-10-CM | POA: Diagnosis not present

## 2023-02-04 DIAGNOSIS — R0600 Dyspnea, unspecified: Secondary | ICD-10-CM | POA: Diagnosis not present

## 2023-02-04 DIAGNOSIS — R001 Bradycardia, unspecified: Secondary | ICD-10-CM | POA: Diagnosis not present

## 2023-02-04 DIAGNOSIS — I509 Heart failure, unspecified: Secondary | ICD-10-CM | POA: Diagnosis not present

## 2023-02-04 DIAGNOSIS — I2510 Atherosclerotic heart disease of native coronary artery without angina pectoris: Secondary | ICD-10-CM | POA: Diagnosis not present

## 2023-02-04 DIAGNOSIS — R079 Chest pain, unspecified: Secondary | ICD-10-CM | POA: Diagnosis not present

## 2023-02-04 DIAGNOSIS — I10 Essential (primary) hypertension: Secondary | ICD-10-CM | POA: Diagnosis not present

## 2023-02-04 DIAGNOSIS — R200 Anesthesia of skin: Secondary | ICD-10-CM | POA: Diagnosis not present

## 2023-02-04 DIAGNOSIS — Z944 Liver transplant status: Secondary | ICD-10-CM | POA: Diagnosis not present

## 2023-02-04 DIAGNOSIS — R0602 Shortness of breath: Secondary | ICD-10-CM | POA: Diagnosis not present

## 2023-02-04 DIAGNOSIS — R29818 Other symptoms and signs involving the nervous system: Secondary | ICD-10-CM | POA: Diagnosis not present

## 2023-02-04 NOTE — Telephone Encounter (Signed)
Karie Schwalbe, MD  to Me  Devin White, [facility]     02/04/23  7:26 AM  I don't see anything in CareEverywhere See if you can find out if he is doing okay  Unable to reach pt by phone and left v/m requesting pt to call [phone]. Sending note to [place] pool

## 2023-02-04 NOTE — Telephone Encounter (Signed)
I spoke with pt; pt said he went to [facility] ED early this morning and pt is still in ED. Pt said when he got there BP 219/120; pt has had CT of head and chest. Pt said still lt sided numbness and tingling and lt eye blurred vision with a bad H/A. ED gave pt Tylenol but has not helped. BP now is 182/103.pt said the ED is questioning a stroke but has not yet decided what is causing symptoms. Pt has not heard from CT scan reports yet. Pt appreciated call and hopes he will know why he feels the way he does before he leaves [facility]. Sending note to Dr Alphonsus Sias and Alphonsus Sias pool,

## 2023-02-05 NOTE — Telephone Encounter (Signed)
Spoke to pt. He is still not feeling well. Moved his appt to tomorrow

## 2023-02-06 ENCOUNTER — Ambulatory Visit (INDEPENDENT_AMBULATORY_CARE_PROVIDER_SITE_OTHER): Payer: 59 | Admitting: Internal Medicine

## 2023-02-06 ENCOUNTER — Encounter: Payer: Self-pay | Admitting: Internal Medicine

## 2023-02-06 VITALS — BP 150/74 | HR 90 | Temp 97.5°F | Ht 73.0 in | Wt 275.0 lb

## 2023-02-06 DIAGNOSIS — Z8673 Personal history of transient ischemic attack (TIA), and cerebral infarction without residual deficits: Secondary | ICD-10-CM

## 2023-02-06 DIAGNOSIS — D849 Immunodeficiency, unspecified: Secondary | ICD-10-CM | POA: Diagnosis not present

## 2023-02-06 DIAGNOSIS — I10 Essential (primary) hypertension: Secondary | ICD-10-CM | POA: Diagnosis not present

## 2023-02-06 DIAGNOSIS — R739 Hyperglycemia, unspecified: Secondary | ICD-10-CM | POA: Diagnosis not present

## 2023-02-06 NOTE — Assessment & Plan Note (Signed)
Still follows with neuro, etc

## 2023-02-06 NOTE — Patient Instructions (Signed)
Double the amlodipine to 10mg  daily--and the lisinopril to 40mg  daily. Come back next week--I will recheck labs and your blood pressure

## 2023-02-06 NOTE — Assessment & Plan Note (Signed)
Liver transplant complicates any other treatment Follows at [facility] for this

## 2023-02-06 NOTE — Assessment & Plan Note (Signed)
May be diabetic now Will recheck A1c at next week's follow up Consider meds if still elevated---discussed limiting sugar and carbs

## 2023-02-06 NOTE — Assessment & Plan Note (Signed)
Seems to be treatment resistant Will double the amlodipine to 10 daily and lisinopril to 40mg  daily Recheck labs at follow up Some degree of resistant BP now--might consider spironolactone No adrenal lesions on CT Not really Cushinoid Doubt pheochromocytoma

## 2023-02-06 NOTE — Progress Notes (Signed)
Subjective:    Patient ID: Devin White, male    DOB: [birth year], 67 y.o.   MRN: [id number]  HPI Here for ER follow up--[facility] BP very high---219/120 when first at ER Only down to 169/something  Did CT angiograms of chest and head/neck--no worrisome findings Felt it might be migraine thing Got amlodipine in ER--and Rx given Got another medication as well Steroid and migraine meds (magnesium) Multiple exams by neurology---had left side weakness but scans negative  Still has left facial sensory changes Ringing in ears  Had been doing okay on the lisinopril 20 before this Elevated BP at various outpatient appointments Did add the amlodipine but not clearly bringing his BP down much  Glucose up to 400 Did have A1c of 7.1% in May (at ER)  Current Outpatient Medications on File Prior to Visit  Medication Sig Dispense Refill   acetaminophen (TYLENOL) 325 MG tablet Take 650 mg by mouth every 6 (six) hours as needed for mild pain.     amLODipine (NORVASC) 5 MG tablet Take 5 mg by mouth daily.     aspirin EC 81 MG tablet Take 1 tablet (81 mg total) by mouth daily. Swallow whole. 30 tablet 0   calcium carbonate (TUMS) 500 MG chewable tablet Chew 1 tablet (200 mg of elemental calcium total) by mouth 2 (two) times daily as needed for indigestion or heartburn. 180 tablet 1   lisinopril (ZESTRIL) 20 MG tablet Take 1 tablet (20 mg total) by mouth daily. 90 tablet 3   Multiple Vitamin (MULTIVITAMIN WITH MINERALS) TABS tablet Take 1 tablet by mouth daily.     mycophenolate (CELLCEPT) 250 MG capsule Take 250-500 mg by mouth 2 (two) times daily. 2 capsules (500 mg) in the morning and one capsule (250 mg) in the evening     omeprazole (PRILOSEC) 20 MG capsule TAKE 1 CAPSULE(20 MG) BY MOUTH DAILY 30 capsule 11   polyethylene glycol (MIRALAX / GLYCOLAX) 17 g packet Take 17 g by mouth daily as needed for mild constipation. 30 each 0   predniSONE (DELTASONE) 10 MG tablet Take 10 mg by mouth daily  with breakfast.     rosuvastatin (CRESTOR) 20 MG tablet Take 1 tablet (20 mg total) by mouth daily. 90 tablet 3   tacrolimus (PROGRAF) 0.5 MG capsule Take 0.5-1 mg by mouth See admin instructions. Taking 0.5 mg in the morning and 1 mg at bedtime     tamsulosin (FLOMAX) 0.4 MG CAPS capsule TAKE 1 CAPSULE(0.4 MG) BY MOUTH DAILY 90 capsule 3   No current facility-administered medications on file prior to visit.    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   History of cirrhosis of liver S/P TRANSPLANT 2009   History of liver failure S/P TRANSPLANT   Hypertension    Left ureteral calculus    Renal disorder    Stroke [facility])     Past Surgical History:  Procedure Laterality Date   APPENDECTOMY     bone morrow biopsy     CHOLECYSTECTOMY  2007   CYSTO/ LEFT RETROGRADE PYELOGRAM/ LEFT  URETERAL STENT PLACEMENT  03-05-2012  DR Berneice Heinrich [facility])   LEFT URETERAL CALCULI   CYSTOSCOPY W/ URETERAL STENT PLACEMENT  03/11/2012   Procedure: CYSTOSCOPY WITH STENT REPLACEMENT;  Surgeon: Sebastian Ache, MD;  Location: [facility];  Service: Urology;  Laterality: Left;   HERNIA REPAIR  2008   LIVER BIOPSY     LIVER TRANSPLANT  11/24/2007   pt states doing well since liver transplant   LUMBAR DISC SURGERY     LUMBAR FUSION     removal of fistula     URETEROSCOPY  03/11/2012   Procedure: URETEROSCOPY;  Surgeon: Sebastian Ache, MD;  Location: [facility];  Service: Urology;  Laterality: Left;   STONE MANIPULATION, stone obtained  [phone] [employer] MCR    Family History  Problem Relation Age of Onset   Cancer Mother         colon   Arthritis Mother    Vasculitis Mother    Hypertension Mother    Cancer Father        colon   Kidney disease Father    Arthritis Father    Arthritis Brother    Alcohol abuse Maternal Aunt    Diabetes Maternal Aunt    Alcohol abuse Maternal Uncle    Diabetes Paternal Aunt    Alcohol abuse Maternal Uncle    Alcohol abuse Maternal Uncle     Social History   Socioeconomic History   Marital status: Single    Spouse name: Not on file   Number of children: 3   Years of education: Not on file   Highest education level: 9th grade  Occupational History   Occupation: disabled    Employer: RETIRED  Tobacco Use   Smoking status: Never    Passive exposure: Past   Smokeless tobacco: Never  Vaping Use   Vaping status: Never Used  Substance and Sexual Activity   Alcohol use: No   Drug use: No   Sexual activity: Not on file  Other Topics Concern   Not on file  Social History Narrative   Has living will   Requests daughter Devin White as his health care POA   Would accept brief attempt at resuscitation but no prolonged ventilation   No tube feeds if cognitively unaware   Social Determinants of Health   Financial Resource Strain: Medium Risk (12/23/2022)   Overall Financial Resource Strain (CARDIA)    Difficulty of Paying Living Expenses: Somewhat hard  Food Insecurity: No Food Insecurity (12/23/2022)   Hunger Vital Sign    Worried About Running Out of Food in the Last Year: Never true    Ran Out of Food in the Last Year: Never true  Transportation Needs: No Transportation Needs (12/23/2022)   PRAPARE - Administrator, Civil Service (Medical): No    Lack of Transportation (Non-Medical): No  Physical Activity: Insufficiently Active (12/23/2022)   Exercise Vital Sign    Days of Exercise per Week: 2 days    Minutes of Exercise per Session: 10 min  Stress: No Stress Concern Present (12/23/2022)   [employer] of Occupational Health - Occupational Stress Questionnaire     Feeling of Stress : Not at all  Social Connections: Unknown (12/23/2022)   Social Connection and Isolation Panel [NHANES]    Frequency of Communication with Friends and Family: More than three times a week    Frequency of Social Gatherings with Friends and Family: Once a week    Attends Religious Services: Patient declined  Active Member of Clubs or Organizations: No    Attends Banker Meetings: Never    Marital Status: Divorced  Catering manager Violence: Not At Risk (11/06/2022)   Humiliation, Afraid, Rape, and Kick questionnaire    Fear of Current or Ex-Partner: No    Emotionally Abused: No    Physically Abused: No    Sexually Abused: No   Review of Systems Gets facial flushing Got some chest tightness with the migraine medication Eating okay---appetite is up. Drinks mostly water. Eats at home all the time--generally fairly heathy    Objective:   Physical Exam Constitutional:      Appearance: Normal appearance.  Cardiovascular:     Pulses: Normal pulses.     Heart sounds: Normal heart sounds.  Pulmonary:     Breath sounds: No wheezing or rales.  Musculoskeletal:     Cervical back: Neck supple.     Right lower leg: No edema.     Left lower leg: No edema.  Lymphadenopathy:     Cervical: No cervical adenopathy.  Neurological:     Mental Status: He is alert.            Assessment & Plan:

## 2023-02-10 DIAGNOSIS — Z87440 Personal history of urinary (tract) infections: Secondary | ICD-10-CM | POA: Diagnosis not present

## 2023-02-10 DIAGNOSIS — Z09 Encounter for follow-up examination after completed treatment for conditions other than malignant neoplasm: Secondary | ICD-10-CM | POA: Diagnosis not present

## 2023-02-10 DIAGNOSIS — Z87442 Personal history of urinary calculi: Secondary | ICD-10-CM | POA: Diagnosis not present

## 2023-02-10 DIAGNOSIS — N390 Urinary tract infection, site not specified: Secondary | ICD-10-CM | POA: Diagnosis not present

## 2023-02-10 DIAGNOSIS — R339 Retention of urine, unspecified: Secondary | ICD-10-CM | POA: Diagnosis not present

## 2023-02-11 ENCOUNTER — Ambulatory Visit: Payer: 59 | Admitting: Internal Medicine

## 2023-02-13 DIAGNOSIS — M5413 Radiculopathy, cervicothoracic region: Secondary | ICD-10-CM | POA: Diagnosis not present

## 2023-02-13 DIAGNOSIS — G5602 Carpal tunnel syndrome, left upper limb: Secondary | ICD-10-CM | POA: Diagnosis not present

## 2023-02-13 DIAGNOSIS — R202 Paresthesia of skin: Secondary | ICD-10-CM | POA: Diagnosis not present

## 2023-02-13 DIAGNOSIS — G5622 Lesion of ulnar nerve, left upper limb: Secondary | ICD-10-CM | POA: Diagnosis not present

## 2023-02-13 DIAGNOSIS — M5417 Radiculopathy, lumbosacral region: Secondary | ICD-10-CM | POA: Diagnosis not present

## 2023-02-13 DIAGNOSIS — R200 Anesthesia of skin: Secondary | ICD-10-CM | POA: Diagnosis not present

## 2023-02-14 ENCOUNTER — Ambulatory Visit (INDEPENDENT_AMBULATORY_CARE_PROVIDER_SITE_OTHER): Payer: 59 | Admitting: Internal Medicine

## 2023-02-14 ENCOUNTER — Encounter: Payer: Self-pay | Admitting: Internal Medicine

## 2023-02-14 VITALS — BP 138/80 | HR 96 | Temp 97.3°F | Ht 73.0 in | Wt 275.0 lb

## 2023-02-14 DIAGNOSIS — E119 Type 2 diabetes mellitus without complications: Secondary | ICD-10-CM | POA: Diagnosis not present

## 2023-02-14 DIAGNOSIS — I10 Essential (primary) hypertension: Secondary | ICD-10-CM

## 2023-02-14 LAB — POCT GLYCOSYLATED HEMOGLOBIN (HGB A1C): Hemoglobin A1C: 7 % — AB (ref 4.0–5.6)

## 2023-02-14 MED ORDER — LISINOPRIL 40 MG PO TABS: 40.0000 mg | ORAL_TABLET | Freq: Every day | ORAL | 3 refills | Status: DC

## 2023-02-14 MED ORDER — AMLODIPINE BESYLATE 10 MG PO TABS: 10.0000 mg | ORAL_TABLET | Freq: Every day | ORAL | 3 refills | Status: DC

## 2023-02-14 NOTE — Assessment & Plan Note (Signed)
Lab Results  Component Value Date   HGBA1C 7.1 (H) 11/06/2022   Repeat today 7.0% Will set up with diabetic counselor  Hold off on meds---would consider jardiance if okay with transplant team

## 2023-02-14 NOTE — Progress Notes (Signed)
Subjective:    Patient ID: Devin White, male    DOB: 1956/05/11, 67 y.o.   MRN: [id number]  HPI Here for follow up of recent ER visit--and concerns about BP and sugars  BP was down to 108/73 at recent urology visit At home--130's/80's Has felt a bit better --more energy No chest pain or SOB  Is careful about carbs, etc Tries to walk regularly  Current Outpatient Medications on File Prior to Visit  Medication Sig Dispense Refill   acetaminophen (TYLENOL) 325 MG tablet Take 650 mg by mouth every 6 (six) hours as needed for mild pain.     amLODipine (NORVASC) 5 MG tablet Take 5 mg by mouth daily.     aspirin EC 81 MG tablet Take 1 tablet (81 mg total) by mouth daily. Swallow whole. 30 tablet 0   calcium carbonate (TUMS) 500 MG chewable tablet Chew 1 tablet (200 mg of elemental calcium total) by mouth 2 (two) times daily as needed for indigestion or heartburn. 180 tablet 1   lisinopril (ZESTRIL) 20 MG tablet Take 1 tablet (20 mg total) by mouth daily. 90 tablet 3   Multiple Vitamin (MULTIVITAMIN WITH MINERALS) TABS tablet Take 1 tablet by mouth daily.     mycophenolate (CELLCEPT) 250 MG capsule Take 250-500 mg by mouth 2 (two) times daily. 2 capsules (500 mg) in the morning and one capsule (250 mg) in the evening     omeprazole (PRILOSEC) 20 MG capsule TAKE 1 CAPSULE(20 MG) BY MOUTH DAILY 30 capsule 11   polyethylene glycol (MIRALAX / GLYCOLAX) 17 g packet Take 17 g by mouth daily as needed for mild constipation. 30 each 0   predniSONE (DELTASONE) 10 MG tablet Take 10 mg by mouth daily with breakfast.     rosuvastatin (CRESTOR) 20 MG tablet Take 1 tablet (20 mg total) by mouth daily. 90 tablet 3   tacrolimus (PROGRAF) 0.5 MG capsule Take 0.5-1 mg by mouth See admin instructions. Taking 0.5 mg in the morning and 1 mg at bedtime     tamsulosin (FLOMAX) 0.4 MG CAPS capsule TAKE 1 CAPSULE(0.4 MG) BY MOUTH DAILY 90 capsule 3   No current facility-administered medications on file prior to  visit.    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   History of cirrhosis of liver S/P TRANSPLANT 2009   History of liver failure S/P TRANSPLANT   Hypertension    Left ureteral calculus    Renal disorder    Stroke [facility])     Past Surgical History:  Procedure Laterality Date   APPENDECTOMY     bone morrow biopsy     CHOLECYSTECTOMY  2007   CYSTO/ LEFT RETROGRADE PYELOGRAM/ LEFT URETERAL STENT PLACEMENT  03-05-2012  DR Berneice Heinrich [facility])   LEFT URETERAL CALCULI   CYSTOSCOPY W/ URETERAL STENT PLACEMENT  03/11/2012   Procedure: CYSTOSCOPY WITH STENT REPLACEMENT;  Surgeon: Sebastian Ache, MD;  Location: [facility];  Service: Urology;  Laterality: Left;   HERNIA REPAIR  2008   LIVER BIOPSY     LIVER TRANSPLANT  11/24/2007   pt states doing well since liver transplant   LUMBAR DISC SURGERY     LUMBAR FUSION     removal of fistula     URETEROSCOPY  03/11/2012   Procedure: URETEROSCOPY;  Surgeon: Sebastian Ache, MD;  Location: [facility];  Service: Urology;  Laterality: Left;   STONE MANIPULATION, stone obtained  [phone] [employer] MCR    Family History  Problem Relation Age of Onset   Cancer Mother        colon   Arthritis Mother    Vasculitis Mother    Hypertension Mother    Cancer Father        colon   Kidney disease Father    Arthritis Father    Arthritis Brother    Alcohol abuse Maternal Aunt    Diabetes Maternal Aunt    Alcohol abuse Maternal Uncle    Diabetes Paternal Aunt    Alcohol abuse Maternal Uncle    Alcohol abuse Maternal Uncle     Social History   Socioeconomic History    Marital status: Single    Spouse name: Not on file   Number of children: 3   Years of education: Not on file   Highest education level: 9th grade  Occupational History   Occupation: disabled    Employer: RETIRED  Tobacco Use   Smoking status: Never    Passive exposure: Past   Smokeless tobacco: Never  Vaping Use   Vaping status: Never Used  Substance and Sexual Activity   Alcohol use: No   Drug use: No   Sexual activity: Not on file  Other Topics Concern   Not on file  Social History Narrative   Has living will   Requests daughter [name] as his health care POA   Would accept brief attempt at resuscitation but no prolonged ventilation   No tube feeds if cognitively unaware   Social Determinants of Health   Financial Resource Strain: Medium Risk (12/23/2022)   Overall Financial Resource Strain (CARDIA)    Difficulty of Paying Living Expenses: Somewhat hard  Food Insecurity: No Food Insecurity (12/23/2022)   Hunger Vital Sign    Worried About Running Out of Food in the Last Year: Never true    Ran Out of Food in the Last Year: Never true  Transportation Needs: No Transportation Needs (12/23/2022)   PRAPARE - Administrator, Civil Service (Medical): No    Lack of Transportation (Non-Medical): No  Physical Activity: Insufficiently Active (12/23/2022)   Exercise Vital Sign    Days of Exercise per Week: 2 days    Minutes of Exercise per Session: 10 min  Stress: No Stress Concern Present (12/23/2022)   [employer] of Occupational Health - Occupational Stress Questionnaire    Feeling of Stress : Not at all  Social Connections: Unknown (12/23/2022)   Social Connection and Isolation Panel [NHANES]    Frequency of Communication with Friends and Family: More than three times a week    Frequency of Social Gatherings with Friends and Family: Once a week    Attends Religious Services: Patient declined    Database administrator or Organizations: No    Attends Tax inspector Meetings: Never    Marital Status: Divorced  Catering manager Violence: Not At Risk (11/06/2022)   Humiliation, Afraid, Rape, and Kick questionnaire    Fear of Current or Ex-Partner: No    Emotionally Abused: No    Physically Abused: No  Sexually Abused: No   Review of Systems Sleeps okay--still with frequent nocturia. Usually gets back to sleep okay     Objective:   Physical Exam Constitutional:      Appearance: Normal appearance.  Cardiovascular:     Rate and Rhythm: Normal rate and regular rhythm.     Heart sounds: No murmur heard.    No gallop.  Pulmonary:     Effort: Pulmonary effort is normal.     Breath sounds: Normal breath sounds. No wheezing or rales.  Musculoskeletal:     Cervical back: Neck supple.  Lymphadenopathy:     Cervical: No cervical adenopathy.  Neurological:     Mental Status: He is alert.  Psychiatric:        Mood and Affect: Mood normal.        Behavior: Behavior normal.            Assessment & Plan:

## 2023-02-14 NOTE — Assessment & Plan Note (Signed)
BP Readings from Last 3 Encounters:  02/14/23 138/80  02/06/23 (!) 150/74  12/27/22 110/86   Better now with amlodipine 10mg  /lisiniopril 40 daily He will monitor

## 2023-02-14 NOTE — Addendum Note (Signed)
Addended by: Eual Fines on: 02/14/2023 10:00 AM   Modules accepted: Orders

## 2023-02-24 DIAGNOSIS — M542 Cervicalgia: Secondary | ICD-10-CM | POA: Diagnosis not present

## 2023-02-24 DIAGNOSIS — M5412 Radiculopathy, cervical region: Secondary | ICD-10-CM | POA: Diagnosis not present

## 2023-02-24 DIAGNOSIS — M9931 Osseous stenosis of neural canal of cervical region: Secondary | ICD-10-CM | POA: Diagnosis not present

## 2023-02-27 ENCOUNTER — Encounter: Payer: 59 | Attending: Internal Medicine | Admitting: Dietician

## 2023-02-27 ENCOUNTER — Encounter: Payer: Self-pay | Admitting: Dietician

## 2023-02-27 DIAGNOSIS — Z713 Dietary counseling and surveillance: Secondary | ICD-10-CM | POA: Diagnosis not present

## 2023-02-27 DIAGNOSIS — Z944 Liver transplant status: Secondary | ICD-10-CM | POA: Insufficient documentation

## 2023-02-27 DIAGNOSIS — I509 Heart failure, unspecified: Secondary | ICD-10-CM | POA: Diagnosis not present

## 2023-02-27 DIAGNOSIS — E119 Type 2 diabetes mellitus without complications: Secondary | ICD-10-CM | POA: Insufficient documentation

## 2023-02-27 DIAGNOSIS — Z8673 Personal history of transient ischemic attack (TIA), and cerebral infarction without residual deficits: Secondary | ICD-10-CM | POA: Insufficient documentation

## 2023-02-27 NOTE — Progress Notes (Signed)
Diabetes Self-Management Education  Visit Type: First/Initial  Appt. Start Time: 1015 Appt. End Time: 1120  02/27/2023  [name], identified by name and date of birth, is a 67 y.o. male with a diagnosis of Diabetes: Type 2.   ASSESSMENT  There were no vitals taken for this visit. There is no height or weight on file to calculate BMI.  Pt reports surprise over DM diagnosis, most recent A1c of 7.0% (02/14/2023), however recent labs reveal CBG values of 403 mg/dL, 010 mg/dL (2/72/5366), 440 mg/dL (3/47/4259). Pt reports interest in glucometer, Rd provided testing instruction during visit. CBG taken during visit - 286 mg/dL Pt reports PMH of liver transplant 15 years ago, hospitalization for CVA in May, history of previous CVA, diagnosis of CHF, hx recurrent UTIs. Pt reports sweating excessively, states this has been a problem for years.  Pt reports frequent thirst and dry mouth. Pt reports decline in vision in L eye for a couple years, gotten worse since stroke. Pt reports taking prednisone for ~9 years, increased to 10 mg over the last 2 years.     Diabetes Self-Management Education - 02/27/23 1024       Visit Information   Visit Type First/Initial      Initial Visit   Diabetes Type Type 2    Date Diagnosed 01/2023    Are you currently following a meal plan? No    Are you taking your medications as prescribed? Not on Medications      Health Coping   How would you rate your overall health? Fair      Psychosocial Assessment   What is the hardest part about your diabetes right now, causing you the most concern, or is the most worrisome to you about your diabetes?   Making healty food and beverage choices;Getting support / problem solving    Self-care barriers Debilitated state due to current medical condition   Hx multiple CVA   Self-management support Doctor's office    Other persons present Patient    Patient Concerns Nutrition/Meal planning;Glycemic Control;Monitoring     Special Needs None    Preferred Learning Style No preference indicated    Learning Readiness Not Ready    How often do you need to have someone help you when you read instructions, pamphlets, or other written materials from your doctor or pharmacy? 1 - Never    What is the last grade level you completed in school? 10th      Pre-Education Assessment   Patient understands the diabetes disease and treatment process. Needs Instruction    Patient understands incorporating nutritional management into lifestyle. Needs Instruction    Patient undertands incorporating physical activity into lifestyle. Needs Instruction    Patient understands using medications safely. Needs Instruction    Patient understands monitoring blood glucose, interpreting and using results Needs Instruction    Patient understands prevention, detection, and treatment of acute complications. Needs Instruction    Patient understands prevention, detection, and treatment of chronic complications. Needs Instruction    Patient understands how to develop strategies to address psychosocial issues. Needs Instruction    Patient understands how to develop strategies to promote health/change behavior. Needs Instruction      Complications   Last HgB A1C per patient/outside source 7 %   02/14/2023   How often do you check your blood sugar? 0 times/day (not testing)    Have you had a dilated eye exam in the past 12 months? No    Have you had  a dental exam in the past 12 months? No    Are you checking your feet? No      Dietary Intake   Breakfast None yesterday, typically has a granola    Lunch [employer] cheeseburger    Dinner Spaghetti and meatballl    Beverage(s) water, ocean spray cranberry juice      Activity / Exercise   Activity / Exercise Type ADL's    How many days per week do you exercise? 0    How many minutes per day do you exercise? 0    Total minutes per week of exercise 0      Patient Education   Previous Diabetes  Education No    Disease Pathophysiology Factors that contribute to the development of diabetes;Explored patient's options for treatment of their diabetes    Healthy Eating Plate Method;Role of diet in the treatment of diabetes and the relationship between the three main macronutrients and blood glucose level    Being Active Role of exercise on diabetes management, blood pressure control and cardiac health.    Chronic complications Relationship between chronic complications and blood glucose control;Lipid levels, blood glucose control and heart disease;Reviewed with patient heart disease, higher risk of, and prevention    Diabetes Stress and Support Identified and addressed patients feelings and concerns about diabetes;Role of stress on diabetes    Lifestyle and Health Coping Lifestyle issues that need to be addressed for better diabetes care      Individualized Goals (developed by patient)   Nutrition Follow meal plan discussed    Medications Not Applicable    Problem Solving Eating Pattern    Health Coping Ask for help with psychological, social, or emotional issues      Post-Education Assessment   Patient understands the diabetes disease and treatment process. Needs Review    Patient understands incorporating nutritional management into lifestyle. Needs Review    Patient undertands incorporating physical activity into lifestyle. Needs Review    Patient understands using medications safely. Needs Review    Patient understands monitoring blood glucose, interpreting and using results Needs Review    Patient understands prevention, detection, and treatment of acute complications. Needs Review    Patient understands prevention, detection, and treatment of chronic complications. Needs Review    Patient understands how to develop strategies to address psychosocial issues. Needs Review    Patient understands how to develop strategies to promote health/change behavior. Needs Review      Outcomes    Expected Outcomes Demonstrated interest in learning but significant barriers to change    Future DMSE PRN    Program Status Not Completed             Individualized Plan for Diabetes Self-Management Training:   Learning Objective:  Patient will have a greater understanding of diabetes self-management. Patient education plan is to attend individual and/or group sessions per assessed needs and concerns.   Plan:   Patient Instructions  Work towards eating three meals a day, about 5-6 hours apart!  Begin to recognize carbohydrates, proteins, and non-starchy vegetables in your food choices!  Begin to build your meals using the proportions of the Balanced Plate. First, select your carb choice(s) for the meal. Make this 25% of your meal. Next, select your source of protein to pair with your carb choice(s). Make this another 25% of your meal. Choose LEAN proteins Finally, complete your meal with a variety of non-starchy vegetables. Make this the remaining 50% of your meal.  Limit your  fried foods and high-fat breakfast meats!  Contact your PCP to request a prescription for a glucose meter, test strips, and lancets!   Always clean hands with warm, soapy water or an alcohol pad before checking. Use a new lancet for each test. Prick on the side of the finger, near the tip beside your finger nail. If blood is slow to come out, massage finger in a downward motion to increase amount of blood.  If sample is too small doesn't come out at all, increase the depth number on your lancing device. Insert strip into meter and be sure meter is ready to test. Touch the strip to the blood until it sucks the blood in fills the sample area.  NEVER TRY TO PUT BLOOD ONTO THE STRIP. Dispose of used lancets in a hard plastic container. Once full, cap the container, duct tape the cap shut, and dispose of it in the regular trash.  Check your blood sugar each morning before eating or drinking (fasting). Look  for numbers under 130 mg/dL Check your blood sugar 2 hours after you begin eating a meal. Look for numbers under 180 mg/dL at all times.         Expected Outcomes:  Demonstrated interest in learning but significant barriers to change  Education material provided: ADA - How to Thrive: A Guide for Your Journey with Diabetes and My Plate  If problems or questions, patient to contact team via:  Phone and Email  Future DSME appointment: PRN

## 2023-02-27 NOTE — Patient Instructions (Addendum)
Work towards eating three meals a day, about 5-6 hours apart!  Begin to recognize carbohydrates, proteins, and non-starchy vegetables in your food choices!  Begin to build your meals using the proportions of the Balanced Plate. First, select your carb choice(s) for the meal. Make this 25% of your meal. Next, select your source of protein to pair with your carb choice(s). Make this another 25% of your meal. Choose LEAN proteins Finally, complete your meal with a variety of non-starchy vegetables. Make this the remaining 50% of your meal.  Limit your fried foods and high-fat breakfast meats!  Contact your PCP to request a prescription for a glucose meter, test strips, and lancets!   Always clean hands with warm, soapy water or an alcohol pad before checking. Use a new lancet for each test. Prick on the side of the finger, near the tip beside your finger nail. If blood is slow to come out, massage finger in a downward motion to increase amount of blood.  If sample is too small doesn't come out at all, increase the depth number on your lancing device. Insert strip into meter and be sure meter is ready to test. Touch the strip to the blood until it sucks the blood in fills the sample area.  NEVER TRY TO PUT BLOOD ONTO THE STRIP. Dispose of used lancets in a hard plastic container. Once full, cap the container, duct tape the cap shut, and dispose of it in the regular trash.  Check your blood sugar each morning before eating or drinking (fasting). Look for numbers under 130 mg/dL Check your blood sugar 2 hours after you begin eating a meal. Look for numbers under 180 mg/dL at all times.

## 2023-02-28 ENCOUNTER — Other Ambulatory Visit: Payer: Self-pay

## 2023-02-28 MED ORDER — ACCU-CHEK GUIDE CONTROL VI LIQD: 1.0000 | Freq: Once | 2 refills | Status: AC

## 2023-02-28 MED ORDER — ACCU-CHEK SOFTCLIX LANCETS MISC: 5 refills | Status: DC

## 2023-02-28 MED ORDER — ACCU-CHEK GUIDE W/DEVICE KIT: 1.0000 | PACK | Freq: Once | 0 refills | Status: AC

## 2023-02-28 MED ORDER — ACCU-CHEK SOFTCLIX LANCET DEV KIT: 1.0000 | PACK | Freq: Once | 0 refills | Status: AC

## 2023-02-28 MED ORDER — ACCU-CHEK GUIDE VI STRP: ORAL_STRIP | 5 refills | Status: DC

## 2023-03-11 DIAGNOSIS — M5416 Radiculopathy, lumbar region: Secondary | ICD-10-CM | POA: Diagnosis not present

## 2023-03-11 DIAGNOSIS — R202 Paresthesia of skin: Secondary | ICD-10-CM | POA: Diagnosis not present

## 2023-03-11 DIAGNOSIS — R200 Anesthesia of skin: Secondary | ICD-10-CM | POA: Diagnosis not present

## 2023-04-11 ENCOUNTER — Encounter: Payer: Self-pay | Admitting: Pharmacist

## 2023-04-11 NOTE — Progress Notes (Signed)
Pharmacy Quality Measure Review  This patient is appearing on a report for being at risk of failing the adherence measure for cholesterol (statin) medications this calendar year.   Medication: rosuvastatin 20 mg tablet Last fill date: 12/27/22 for 90 day supply  Insurance report was not up to date. No action needed at this time.  Medication refilled on 03/25/23 prior to adherence fill date.   Loree Fee, PharmD Clinical Pharmacist [facility] [phone]

## 2023-04-18 ENCOUNTER — Other Ambulatory Visit: Payer: Self-pay

## 2023-04-18 ENCOUNTER — Emergency Department: Payer: 59

## 2023-04-18 ENCOUNTER — Inpatient Hospital Stay
Admission: EM | Admit: 2023-04-18 | Discharge: 2023-04-21 | DRG: 638 | Disposition: A | Discharge status: Home Health | Payer: 59 | Attending: Internal Medicine | Admitting: Internal Medicine

## 2023-04-18 DIAGNOSIS — E785 Hyperlipidemia, unspecified: Secondary | ICD-10-CM | POA: Diagnosis not present

## 2023-04-18 DIAGNOSIS — D72829 Elevated white blood cell count, unspecified: Secondary | ICD-10-CM | POA: Diagnosis not present

## 2023-04-18 DIAGNOSIS — T380X5A Adverse effect of glucocorticoids and synthetic analogues, initial encounter: Secondary | ICD-10-CM | POA: Diagnosis present

## 2023-04-18 DIAGNOSIS — N1832 Chronic kidney disease, stage 3b: Secondary | ICD-10-CM | POA: Diagnosis present

## 2023-04-18 DIAGNOSIS — Z841 Family history of disorders of kidney and ureter: Secondary | ICD-10-CM

## 2023-04-18 DIAGNOSIS — I509 Heart failure, unspecified: Secondary | ICD-10-CM | POA: Diagnosis not present

## 2023-04-18 DIAGNOSIS — Z808 Family history of malignant neoplasm of other organs or systems: Secondary | ICD-10-CM

## 2023-04-18 DIAGNOSIS — E1122 Type 2 diabetes mellitus with diabetic chronic kidney disease: Secondary | ICD-10-CM | POA: Diagnosis not present

## 2023-04-18 DIAGNOSIS — Z7982 Long term (current) use of aspirin: Secondary | ICD-10-CM | POA: Diagnosis not present

## 2023-04-18 DIAGNOSIS — E669 Obesity, unspecified: Secondary | ICD-10-CM | POA: Diagnosis not present

## 2023-04-18 DIAGNOSIS — R7401 Elevation of levels of liver transaminase levels: Secondary | ICD-10-CM | POA: Diagnosis not present

## 2023-04-18 DIAGNOSIS — Z944 Liver transplant status: Secondary | ICD-10-CM

## 2023-04-18 DIAGNOSIS — I130 Hypertensive heart and chronic kidney disease with heart failure and stage 1 through stage 4 chronic kidney disease, or unspecified chronic kidney disease: Secondary | ICD-10-CM | POA: Diagnosis not present

## 2023-04-18 DIAGNOSIS — E1100 Type 2 diabetes mellitus with hyperosmolarity without nonketotic hyperglycemic-hyperosmolar coma (NKHHC): Secondary | ICD-10-CM | POA: Diagnosis not present

## 2023-04-18 DIAGNOSIS — Z7984 Long term (current) use of oral hypoglycemic drugs: Secondary | ICD-10-CM

## 2023-04-18 DIAGNOSIS — Z811 Family history of alcohol abuse and dependence: Secondary | ICD-10-CM

## 2023-04-18 DIAGNOSIS — Z9049 Acquired absence of other specified parts of digestive tract: Secondary | ICD-10-CM | POA: Diagnosis not present

## 2023-04-18 DIAGNOSIS — N400 Enlarged prostate without lower urinary tract symptoms: Secondary | ICD-10-CM | POA: Diagnosis present

## 2023-04-18 DIAGNOSIS — Z1152 Encounter for screening for COVID-19: Secondary | ICD-10-CM

## 2023-04-18 DIAGNOSIS — N189 Chronic kidney disease, unspecified: Secondary | ICD-10-CM

## 2023-04-18 DIAGNOSIS — E66811 Obesity, class 1: Secondary | ICD-10-CM | POA: Diagnosis present

## 2023-04-18 DIAGNOSIS — R739 Hyperglycemia, unspecified: Principal | ICD-10-CM

## 2023-04-18 DIAGNOSIS — Z833 Family history of diabetes mellitus: Secondary | ICD-10-CM

## 2023-04-18 DIAGNOSIS — Z6834 Body mass index (BMI) 34.0-34.9, adult: Secondary | ICD-10-CM

## 2023-04-18 DIAGNOSIS — E1165 Type 2 diabetes mellitus with hyperglycemia: Secondary | ICD-10-CM | POA: Diagnosis not present

## 2023-04-18 DIAGNOSIS — Z79899 Other long term (current) drug therapy: Secondary | ICD-10-CM

## 2023-04-18 DIAGNOSIS — E1121 Type 2 diabetes mellitus with diabetic nephropathy: Secondary | ICD-10-CM

## 2023-04-18 DIAGNOSIS — I69354 Hemiplegia and hemiparesis following cerebral infarction affecting left non-dominant side: Secondary | ICD-10-CM

## 2023-04-18 DIAGNOSIS — Z794 Long term (current) use of insulin: Secondary | ICD-10-CM

## 2023-04-18 DIAGNOSIS — R079 Chest pain, unspecified: Secondary | ICD-10-CM | POA: Diagnosis present

## 2023-04-18 DIAGNOSIS — R42 Dizziness and giddiness: Secondary | ICD-10-CM | POA: Diagnosis not present

## 2023-04-18 DIAGNOSIS — R9431 Abnormal electrocardiogram [ECG] [EKG]: Secondary | ICD-10-CM | POA: Diagnosis not present

## 2023-04-18 DIAGNOSIS — I10 Essential (primary) hypertension: Secondary | ICD-10-CM | POA: Diagnosis present

## 2023-04-18 DIAGNOSIS — H538 Other visual disturbances: Secondary | ICD-10-CM | POA: Diagnosis present

## 2023-04-18 DIAGNOSIS — R200 Anesthesia of skin: Secondary | ICD-10-CM | POA: Diagnosis present

## 2023-04-18 DIAGNOSIS — I5032 Chronic diastolic (congestive) heart failure: Secondary | ICD-10-CM | POA: Diagnosis not present

## 2023-04-18 DIAGNOSIS — Z7960 Long term (current) use of unspecified immunomodulators and immunosuppressants: Secondary | ICD-10-CM

## 2023-04-18 DIAGNOSIS — K219 Gastro-esophageal reflux disease without esophagitis: Secondary | ICD-10-CM | POA: Diagnosis not present

## 2023-04-18 DIAGNOSIS — E1129 Type 2 diabetes mellitus with other diabetic kidney complication: Secondary | ICD-10-CM | POA: Diagnosis present

## 2023-04-18 DIAGNOSIS — N179 Acute kidney failure, unspecified: Secondary | ICD-10-CM | POA: Diagnosis present

## 2023-04-18 DIAGNOSIS — Z87442 Personal history of urinary calculi: Secondary | ICD-10-CM

## 2023-04-18 DIAGNOSIS — I639 Cerebral infarction, unspecified: Secondary | ICD-10-CM | POA: Diagnosis present

## 2023-04-18 DIAGNOSIS — Z888 Allergy status to other drugs, medicaments and biological substances status: Secondary | ICD-10-CM

## 2023-04-18 DIAGNOSIS — K760 Fatty (change of) liver, not elsewhere classified: Secondary | ICD-10-CM | POA: Diagnosis not present

## 2023-04-18 DIAGNOSIS — Z8349 Family history of other endocrine, nutritional and metabolic diseases: Secondary | ICD-10-CM

## 2023-04-18 DIAGNOSIS — I6782 Cerebral ischemia: Secondary | ICD-10-CM | POA: Diagnosis not present

## 2023-04-18 DIAGNOSIS — Z8261 Family history of arthritis: Secondary | ICD-10-CM

## 2023-04-18 DIAGNOSIS — Z79624 Long term (current) use of inhibitors of nucleotide synthesis: Secondary | ICD-10-CM

## 2023-04-18 DIAGNOSIS — Z8249 Family history of ischemic heart disease and other diseases of the circulatory system: Secondary | ICD-10-CM

## 2023-04-18 DIAGNOSIS — K838 Other specified diseases of biliary tract: Secondary | ICD-10-CM | POA: Diagnosis not present

## 2023-04-18 DIAGNOSIS — I129 Hypertensive chronic kidney disease with stage 1 through stage 4 chronic kidney disease, or unspecified chronic kidney disease: Secondary | ICD-10-CM | POA: Diagnosis not present

## 2023-04-18 LAB — CBC
HCT: 41.1 % (ref 39.0–52.0)
Hemoglobin: 14.1 g/dL (ref 13.0–17.0)
MCH: 32.3 pg (ref 26.0–34.0)
MCHC: 34.3 g/dL (ref 30.0–36.0)
MCV: 94.3 fL (ref 80.0–100.0)
Platelets: 190 10*3/uL (ref 150–400)
RBC: 4.36 MIL/uL (ref 4.22–5.81)
RDW: 14 % (ref 11.5–15.5)
WBC: 14.1 10*3/uL — ABNORMAL HIGH (ref 4.0–10.5)
nRBC: 0.1 % (ref 0.0–0.2)

## 2023-04-18 LAB — BASIC METABOLIC PANEL
Anion gap: 7 (ref 5–15)
Anion gap: 7 (ref 5–15)
BUN: 23 mg/dL (ref 8–23)
BUN: 20 mg/dL (ref 8–23)
CO2: 23 mmol/L (ref 22–32)
CO2: 23 mmol/L (ref 22–32)
Calcium: 8.4 mg/dL — ABNORMAL LOW (ref 8.9–10.3)
Calcium: 8.3 mg/dL — ABNORMAL LOW (ref 8.9–10.3)
Chloride: 106 mmol/L (ref 98–111)
Chloride: 106 mmol/L (ref 98–111)
Creatinine, Ser: 1.85 mg/dL — ABNORMAL HIGH (ref 0.61–1.24)
Creatinine, Ser: 1.77 mg/dL — ABNORMAL HIGH (ref 0.61–1.24)
GFR, Estimated: 40 mL/min — ABNORMAL LOW (ref >60)
GFR, Estimated: 42 mL/min — ABNORMAL LOW (ref >60)
Glucose, Bld: 284 mg/dL — ABNORMAL HIGH (ref 70–99)
Glucose, Bld: 235 mg/dL — ABNORMAL HIGH (ref 70–99)
Potassium: 4.2 mmol/L (ref 3.5–5.1)
Potassium: 3.9 mmol/L (ref 3.5–5.1)
Sodium: 136 mmol/L (ref 135–145)
Sodium: 136 mmol/L (ref 135–145)

## 2023-04-18 LAB — BLOOD GAS, VENOUS
Acid-base deficit: 3.6 mmol/L — ABNORMAL HIGH (ref 0.0–2.0)
Bicarbonate: 23.1 mmol/L (ref 20.0–28.0)
O2 Saturation: 55.4 %
Patient temperature: 37
pCO2, Ven: 47 mm[Hg] (ref 44–60)
pH, Ven: 7.3 (ref 7.25–7.43)
pO2, Ven: 33 mm[Hg] (ref 32–45)

## 2023-04-18 LAB — OSMOLALITY: Osmolality: 316 mosm/kg — ABNORMAL HIGH (ref 275–295)

## 2023-04-18 LAB — COMPREHENSIVE METABOLIC PANEL
ALT: 60 U/L — ABNORMAL HIGH (ref 0–44)
AST: 50 U/L — ABNORMAL HIGH (ref 15–41)
Albumin: 3.9 g/dL (ref 3.5–5.0)
Alkaline Phosphatase: 62 U/L (ref 38–126)
Anion gap: 15 (ref 5–15)
BUN: 24 mg/dL — ABNORMAL HIGH (ref 8–23)
CO2: 19 mmol/L — ABNORMAL LOW (ref 22–32)
Calcium: 8.9 mg/dL (ref 8.9–10.3)
Chloride: 97 mmol/L — ABNORMAL LOW (ref 98–111)
Creatinine, Ser: 2.18 mg/dL — ABNORMAL HIGH (ref 0.61–1.24)
GFR, Estimated: 33 mL/min — ABNORMAL LOW (ref >60)
Glucose, Bld: 676 mg/dL (ref 70–99)
Potassium: 4.3 mmol/L (ref 3.5–5.1)
Sodium: 131 mmol/L — ABNORMAL LOW (ref 135–145)
Total Bilirubin: 2.8 mg/dL — ABNORMAL HIGH (ref 0.3–1.2)
Total Protein: 6.6 g/dL (ref 6.5–8.1)

## 2023-04-18 LAB — URINALYSIS, ROUTINE W REFLEX MICROSCOPIC
Bacteria, UA: NONE SEEN
Bilirubin Urine: NEGATIVE
Glucose, UA: >=500 mg/dL — AB
Hgb urine dipstick: NEGATIVE
Ketones, ur: NEGATIVE mg/dL
Leukocytes,Ua: NEGATIVE
Nitrite: NEGATIVE
Protein, ur: NEGATIVE mg/dL
RBC / HPF: 0 RBC/hpf (ref 0–5)
Specific Gravity, Urine: 1.025 (ref 1.005–1.030)
pH: 5 (ref 5.0–8.0)

## 2023-04-18 LAB — CBG MONITORING, ED
Glucose-Capillary: >600 mg/dL (ref 70–99)
Glucose-Capillary: 599 mg/dL (ref 70–99)
Glucose-Capillary: 428 mg/dL — ABNORMAL HIGH (ref 70–99)
Glucose-Capillary: 574 mg/dL (ref 70–99)
Glucose-Capillary: 372 mg/dL — ABNORMAL HIGH (ref 70–99)
Glucose-Capillary: 272 mg/dL — ABNORMAL HIGH (ref 70–99)
Glucose-Capillary: 228 mg/dL — ABNORMAL HIGH (ref 70–99)
Glucose-Capillary: 247 mg/dL — ABNORMAL HIGH (ref 70–99)
Glucose-Capillary: 250 mg/dL — ABNORMAL HIGH (ref 70–99)
Glucose-Capillary: 213 mg/dL — ABNORMAL HIGH (ref 70–99)
Glucose-Capillary: 220 mg/dL — ABNORMAL HIGH (ref 70–99)

## 2023-04-18 LAB — BRAIN NATRIURETIC PEPTIDE: B Natriuretic Peptide: 24.1 pg/mL (ref 0.0–100.0)

## 2023-04-18 MED ORDER — DEXTROSE IN LACTATED RINGERS 5 % IV SOLN: INTRAVENOUS | Status: DC

## 2023-04-18 MED ORDER — ACETAMINOPHEN 325 MG PO TABS
325.0000 mg | ORAL_TABLET | Freq: Four times a day (QID) | ORAL | Status: DC | PRN
  Administered 2023-04-19 – 2023-04-20 (×3): 325 mg via ORAL
  Filled 2023-04-18 (×5): qty 1

## 2023-04-18 MED ORDER — ENOXAPARIN SODIUM 60 MG/0.6ML IJ SOSY
0.5000 mg/kg | PREFILLED_SYRINGE | INTRAMUSCULAR | Status: DC
  Administered 2023-04-18 – 2023-04-20 (×3): 60 mg via SUBCUTANEOUS
  Filled 2023-04-18 (×3): qty 0.6

## 2023-04-18 MED ORDER — HYDRALAZINE HCL 20 MG/ML IJ SOLN: 5.0000 mg | INTRAMUSCULAR | Status: DC | PRN

## 2023-04-18 MED ORDER — SODIUM CHLORIDE 0.9 % IV BOLUS
1000.0000 mL | Freq: Once | INTRAVENOUS | Status: AC
  Administered 2023-04-18: 1000 mL via INTRAVENOUS

## 2023-04-18 MED ORDER — DEXTROSE 50 % IV SOLN: 0.0000 mL | INTRAVENOUS | Status: DC | PRN

## 2023-04-18 MED ORDER — LACTATED RINGERS IV SOLN: INTRAVENOUS | Status: DC

## 2023-04-18 MED ORDER — POTASSIUM CHLORIDE 10 MEQ/100ML IV SOLN
10.0000 meq | INTRAVENOUS | Status: AC
  Administered 2023-04-18 (×2): 10 meq via INTRAVENOUS
  Filled 2023-04-18 (×2): qty 100

## 2023-04-18 MED ORDER — ONDANSETRON HCL 4 MG/2ML IJ SOLN
4.0000 mg | Freq: Three times a day (TID) | INTRAMUSCULAR | Status: DC | PRN
  Administered 2023-04-19 – 2023-04-20 (×3): 4 mg via INTRAVENOUS
  Filled 2023-04-18 (×3): qty 2

## 2023-04-18 MED ORDER — INSULIN REGULAR(HUMAN) IN NACL 100-0.9 UT/100ML-% IV SOLN
INTRAVENOUS | Status: DC
  Administered 2023-04-18: 13 [IU]/h via INTRAVENOUS
  Filled 2023-04-18: qty 100

## 2023-04-18 MED ORDER — LACTATED RINGERS IV BOLUS
20.0000 mL/kg | Freq: Once | INTRAVENOUS | Status: AC
  Administered 2023-04-18: 2404 mL via INTRAVENOUS

## 2023-04-18 MED ORDER — ENOXAPARIN SODIUM 40 MG/0.4ML IJ SOSY: 40.0000 mg | PREFILLED_SYRINGE | INTRAMUSCULAR | Status: DC

## 2023-04-18 MED ORDER — INSULIN REGULAR(HUMAN) IN NACL 100-0.9 UT/100ML-% IV SOLN
INTRAVENOUS | Status: DC
  Administered 2023-04-18: 1 [IU]/h via INTRAVENOUS
  Administered 2023-04-19: 4.6 [IU]/h via INTRAVENOUS
  Filled 2023-04-18: qty 100

## 2023-04-18 MED ORDER — ONDANSETRON HCL 4 MG/2ML IJ SOLN
4.0000 mg | Freq: Once | INTRAMUSCULAR | Status: AC
  Administered 2023-04-18: 4 mg via INTRAVENOUS
  Filled 2023-04-18: qty 2

## 2023-04-18 NOTE — ED Triage Notes (Addendum)
PT complains SOB x 4weeks, worse on exertion. 2 days ago started to have of dizziness, blurred vision, nausea, left arm and face numbness. Pt said while eating this morning had difficult time chewing food. Pt states dizziness has been getting worse through out the day. Pt has history of stroke. Pt complains of increased thirst and urination. Not currently on medication of diabetes

## 2023-04-18 NOTE — ED Notes (Signed)
Pt to CT at this time.

## 2023-04-18 NOTE — ED Provider Notes (Signed)
[facility] Provider Note   Event Date/Time   First MD Initiated Contact with Patient 04/18/23 1516     (approximate) History  Dizziness and Nausea  HPI Devin White is a 67 y.o. male with a stated past medical of type 2 diabetes not on any medications, hypertension, gout, CHF, and previous liver transplant who presents complaining of increasing weakness with acute weakness on the left side of the body including the head/face and left upper extremity ROS: Patient currently denies any vision changes, tinnitus, difficulty speaking, facial droop, sore throat, chest pain, shortness of breath, abdominal pain, nausea/vomiting/diarrhea, dysuria, or weakness/numbness/paresthesias in any extremity   Physical Exam  Triage Vital Signs: ED Triage Vitals  Encounter Vitals Group     BP 04/18/23 1456 95/65     Systolic BP Percentile --      Diastolic BP Percentile --      Pulse Rate 04/18/23 1456 97     Resp 04/18/23 1456 18     Temp 04/18/23 1456 99.3 F (37.4 C)     Temp src --      SpO2 04/18/23 1456 97 %     Weight 04/18/23 1452 265 lb (120.2 kg)     Height 04/18/23 1452 6\' 1"  (1.854 m)     Head Circumference --      Peak Flow --      Pain Score 04/18/23 1452 8     Pain Loc --      Pain Education --      Exclude from Growth Chart --    Most recent vital signs: Vitals:   04/18/23 2100 04/18/23 2115  BP: 127/86 121/84  Pulse: 70 69  Resp: 18 12  Temp:    SpO2: 98% 98%   General: Awake, oriented x4. CV:  Good peripheral perfusion.  Resp:  Normal effort.  Abd:  No distention.  Other:  Elderly obese Caucasian male resting comfortably in no acute distress ED Results / Procedures / Treatments  Labs (all labs ordered are listed, but only abnormal results are displayed) Labs Reviewed  CBC - Abnormal; Notable for the following components:      Result Value   WBC 14.1 (*)    All other components within normal limits  URINALYSIS, ROUTINE W REFLEX  MICROSCOPIC - Abnormal; Notable for the following components:   Color, Urine YELLOW (*)    APPearance CLEAR (*)    Glucose, UA >=500 (*)    All other components within normal limits  COMPREHENSIVE METABOLIC PANEL - Abnormal; Notable for the following components:   Sodium 131 (*)    Chloride 97 (*)    CO2 19 (*)    Glucose, Bld 676 (*)    BUN 24 (*)    Creatinine, Ser 2.18 (*)    AST 50 (*)    ALT 60 (*)    Total Bilirubin 2.8 (*)    GFR, Estimated 33 (*)    All other components within normal limits  OSMOLALITY - Abnormal; Notable for the following components:   Osmolality 316 (*)    All other components within normal limits  BLOOD GAS, VENOUS - Abnormal; Notable for the following components:   Acid-base deficit 3.6 (*)    All other components within normal limits  BASIC METABOLIC PANEL - Abnormal; Notable for the following components:   Glucose, Bld 284 (*)    Creatinine, Ser 1.85 (*)    Calcium 8.4 (*)    GFR, Estimated 40 (*)  All other components within normal limits  CBG MONITORING, ED - Abnormal; Notable for the following components:   Glucose-Capillary >600 (*)    All other components within normal limits  CBG MONITORING, ED - Abnormal; Notable for the following components:   Glucose-Capillary 599 (*)    All other components within normal limits  CBG MONITORING, ED - Abnormal; Notable for the following components:   Glucose-Capillary 574 (*)    All other components within normal limits  CBG MONITORING, ED - Abnormal; Notable for the following components:   Glucose-Capillary 428 (*)    All other components within normal limits  CBG MONITORING, ED - Abnormal; Notable for the following components:   Glucose-Capillary 372 (*)    All other components within normal limits  CBG MONITORING, ED - Abnormal; Notable for the following components:   Glucose-Capillary 272 (*)    All other components within normal limits  CBG MONITORING, ED - Abnormal; Notable for the  following components:   Glucose-Capillary 247 (*)    All other components within normal limits  CBG MONITORING, ED - Abnormal; Notable for the following components:   Glucose-Capillary 228 (*)    All other components within normal limits  CBG MONITORING, ED - Abnormal; Notable for the following components:   Glucose-Capillary 250 (*)    All other components within normal limits  BRAIN NATRIURETIC PEPTIDE  BASIC METABOLIC PANEL  BASIC METABOLIC PANEL  BASIC METABOLIC PANEL  CBC  AMMONIA  PROTIME-INR  APTT   EKG ED ECG REPORT I, Merwyn Katos, the attending physician, personally viewed and interpreted this ECG. Date: 04/18/2023 EKG Time: 1524 Rate: 79 Rhythm: normal sinus rhythm QRS Axis: normal Intervals: normal ST/T Wave abnormalities: normal Narrative Interpretation: no evidence of acute ischemia RADIOLOGY ED MD interpretation: CT of the head without contrast interpreted by me shows no evidence of acute abnormalities including no intracerebral hemorrhage, obvious masses, or significant edema -Agree with radiology assessment Official radiology report(s): US ABDOMEN LIMITED RUQ (LIVER/GB)  Result Date: 04/18/2023 CLINICAL DATA:  Transaminitis EXAM: ULTRASOUND ABDOMEN LIMITED RIGHT UPPER QUADRANT COMPARISON:  None Available. FINDINGS: Gallbladder: Prior cholecystectomy. Common bile duct: Diameter: 7 mm Liver: No focal lesion identified. Increased parenchymal echogenicity. Portal vein is patent on color Doppler imaging with normal direction of blood flow towards the liver. Other: None. IMPRESSION: 1.  Hepatic steatosis. 2. Mildly dilated common bile duct, within normal limits status post cholecystectomy. Electronically Signed   By: Allegra Lai M.D.   On: 04/18/2023 20:21   CT Head Wo Contrast  Result Date: 04/18/2023 CLINICAL DATA:  Dizziness, blurred vision, stroke suspected EXAM: CT HEAD WITHOUT CONTRAST TECHNIQUE: Contiguous axial images were obtained from the base of the  skull through the vertex without intravenous contrast. RADIATION DOSE REDUCTION: This exam was performed according to the departmental dose-optimization program which includes automated exposure control, adjustment of the mA and/or kV according to patient size and/or use of iterative reconstruction technique. COMPARISON:  11/07/2022 CT head FINDINGS: Brain: No evidence of acute infarction, hemorrhage, mass, mass effect, or midline shift. No hydrocephalus or extra-axial fluid collection. Periventricular white matter changes, likely the sequela of chronic small vessel ischemic disease. Vascular: No hyperdense vessel. Skull: Negative for fracture or focal lesion. Sinuses/Orbits: No acute finding. Other: The mastoid air cells are well aerated. IMPRESSION: No acute intracranial process. Electronically Signed   By: Wiliam Ke M.D.   On: 04/18/2023 17:27   PROCEDURES: Critical Care performed: No .1-3 Lead EKG Interpretation  Performed by: Vicente Males,  Clent Jacks, MD Authorized by: Merwyn Katos, MD     Interpretation: normal     ECG rate:  71   ECG rate assessment: normal     Rhythm: sinus rhythm     Ectopy: none     Conduction: normal    MEDICATIONS ORDERED IN ED: Medications  lactated ringers infusion (0 mLs Intravenous Hold 04/18/23 2001)  dextrose 5 % in lactated ringers infusion ( Intravenous Rate/Dose Change 04/18/23 2001)  ondansetron (ZOFRAN) injection 4 mg (has no administration in time range)  hydrALAZINE (APRESOLINE) injection 5 mg (has no administration in time range)  acetaminophen (TYLENOL) tablet 325 mg (has no administration in time range)  insulin regular, human (MYXREDLIN) 100 units/ 100 mL infusion (6 Units/hr Intravenous Infusion Verify 04/18/23 2124)  potassium chloride 10 mEq in 100 mL IVPB (10 mEq Intravenous New Bag/Given 04/18/23 2120)  dextrose 50 % solution 0-50 mL (has no administration in time range)  enoxaparin (LOVENOX) injection 60 mg (60 mg Subcutaneous Given 04/18/23 2124)   sodium chloride 0.9 % bolus 1,000 mL (0 mLs Intravenous Stopped 04/18/23 1833)  ondansetron (ZOFRAN) injection 4 mg (4 mg Intravenous Given 04/18/23 1520)  lactated ringers bolus 2,404 mL (0 mLs Intravenous Stopped 04/18/23 1843)   IMPRESSION / MDM / ASSESSMENT AND PLAN / ED COURSE  I reviewed the triage vital signs and the nursing notes.                             The patient is on the cardiac monitor to evaluate for evidence of arrhythmia and/or significant heart rate changes. Patient's presentation is most consistent with acute presentation with potential threat to life or bodily function. Patient's presentation most consistent with hyperglycemic state WITHOUT evidence of DKA. Given Exam, History, and Workup I have low suspicion for an emergent precipitating factor of this hyperglycemic state such as atypical MI, acute abdomen, or other serious bacterial illness. Patient is Type 2 Diabetic with changes in medication regimen/adherence.  Findings: Patient without AGAP or significant ketones in urine to suggest DKA.  However, patient does have significantly elevated glucose with blurred vision and paresthesias in hands and feet concerning for HHS Interventions: IVF bolus, insulin drip  Re-evaluation: Patient's serum glucose downtrended significantly with stable electrolytes and no anion gap at this time.  Given patient's new diagnosis and not being on any medications previous to this episode, patient will require admission to the internal medicine service for further evaluation and new antihyperglycemic regimen  Disposition: Admit to medicine   FINAL CLINICAL IMPRESSION(S) / ED DIAGNOSES   Final diagnoses:  Hyperglycemia  Blurry vision, bilateral  Acute renal failure superimposed on chronic kidney disease, unspecified acute renal failure type, unspecified CKD stage (HCC)   Rx / DC Orders   ED Discharge Orders     None      Note:  This document was prepared using Dragon voice  recognition software and may include unintentional dictation errors.   Merwyn Katos, MD 04/18/23 2154

## 2023-04-18 NOTE — Progress Notes (Signed)
PHARMACIST - PHYSICIAN COMMUNICATION  CONCERNING:  Enoxaparin (Lovenox) for DVT Prophylaxis    RECOMMENDATION: Patient was prescribed enoxaprin 40mg  q24 hours for VTE prophylaxis.   Filed Weights   04/18/23 1452  Weight: 120.2 kg (265 lb)    Body mass index is 34.96 kg/m.  Estimated Creatinine Clearance: 45.9 mL/min (A) (by C-G formula based on SCr of 2.18 mg/dL (H)).   Based on [facility] policy patient is candidate for enoxaparin 0.5mg /kg TBW SQ every 24 hours based on BMI being >30.   DESCRIPTION: Pharmacy has adjusted enoxaparin dose per [facility] policy.  Patient is now receiving enoxaparin 60 mg every 24 hours    Rachard Isidro Rodriguez-Guzman PharmD, BCPS 04/18/2023 8:05 PM

## 2023-04-18 NOTE — ED Notes (Signed)
Critical result: glucose 676  Bradler, MD aware

## 2023-04-18 NOTE — H&P (Signed)
History and Physical    ZAKHAI MEISINGER White:[id number] DOB: 03/01/1956 DOA: 04/18/2023  Referring MD/NP/PA:   PCP: Karie Schwalbe, MD   Patient coming from:  The patient is coming from home.     Chief Complaint: Dizziness, blurry vision, left-sided numbness and weakness, shortness of breath, chest pain  HPI: Devin White is a 67 y.o. male with medical history significant of liver cirrhosis (s/p of liver transplantation 2009, on immunosuppressant), HTN, HLD, DM not on medications, diastolic CHF, stroke, gout, GERD, BPH, CKD-3B, IBS, who presents with dizziness, blurry vision, left-sided numbness and weakness, SOB, chest pain.  Patient has multiple symptoms, including dizziness, blurry vision, left-sided numbness and weakness, shortness of breath, chest pain.  Patient has dry cough, no fever or chills.  His chest pain is located in front and left side of chest, pressure-like, not pleuritic, intermittent, mild to moderate, nonradiating.  He states that he has history of stroke with mild left-sided weakness.  In the past several days, he has numbness in his face and left arm and the leg.  He also feel his left-sided weakness worse in the arm and leg.  He has nausea, no vomiting, diarrhea or abdominal pain. Patient has a history of diabetes, but not on medications currently.  His blood pressure is elevated at 676 in ED.  He has increased thirst and polyuria.  Denies dysuria.  Data reviewed independently and ED Course: pt was found to have HHS (blood sugar 676, anion gap 15, bicarbonate 19, pH 7.3 by VBG, negative ketone in UA), negative urinalysis for UTI, WBC 14.1, BNP 24.1 worsening renal function with creatinine 2.18, BUN 24, GFR 33 (recent baseline creatinine 1.56), abnormal liver function (ALP 62, AST 50, ALT 60, total bilirubin 2.8).  Temperature normal, blood pressure 132/90, heart rate 97, RR 18, oxygen sat 97% on room air.  CT of head negative.  Patient is admitted to stepdown as  inpatient.  Right upper quadrant ultrasound: 1.  Hepatic steatosis. 2. Mildly dilated common bile duct, within normal limits status post cholecystectomy.     EKG: I have personally reviewed.  Sinus rhythm, QTc 462, LAD, poor R wave progression, T wave inversion in lead III   Review of Systems:   General: no fevers, chills, no body weight gain, has fatigue HEENT: no blurry vision, hearing changes or sore throat Respiratory: has dyspnea, coughing, no wheezing CV: has chest pain, no palpitations GI: has nausea, no vomiting, abdominal pain, diarrhea, constipation GU: no dysuria, burning on urination, increased urinary frequency, hematuria  Ext: no leg edema Neuro: has dizziness, blurry vision, left-sided numbness and weakness  Skin: no rash, no skin tear. MSK: No muscle spasm, no deformity, no limitation of range of movement in spin Heme: No easy bruising.  Travel history: No recent long distant travel.   Allergy:  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   History of cirrhosis of liver S/P TRANSPLANT 2009   History of liver failure S/P TRANSPLANT   Hypertension    Left ureteral calculus    Renal disorder  Stroke [facility])     Past Surgical History:  Procedure Laterality Date   APPENDECTOMY     bone morrow biopsy     CHOLECYSTECTOMY  2007   CYSTO/ LEFT RETROGRADE PYELOGRAM/ LEFT URETERAL STENT PLACEMENT  03-05-2012  DR Berneice Heinrich [facility])   LEFT URETERAL CALCULI   CYSTOSCOPY W/ URETERAL STENT PLACEMENT  03/11/2012   Procedure: CYSTOSCOPY WITH STENT REPLACEMENT;  Surgeon: Sebastian Ache, MD;  Location: [facility];  Service: Urology;  Laterality: Left;   HERNIA REPAIR  2008   LIVER BIOPSY     LIVER TRANSPLANT  11/24/2007   pt states doing well since liver transplant   LUMBAR DISC SURGERY     LUMBAR FUSION     removal of fistula     URETEROSCOPY  03/11/2012   Procedure: URETEROSCOPY;  Surgeon: Sebastian Ache, MD;  Location: [facility];  Service: Urology;  Laterality: Left;   STONE MANIPULATION, stone obtained  [phone] [facility] MCR    Social History:  reports that he has never smoked. He has been exposed to tobacco smoke. He has never used smokeless tobacco. He reports that he does not drink alcohol and does not use drugs.  Family History:  Family History  Problem Relation Age of Onset   Cancer Mother        colon   Arthritis Mother    Vasculitis Mother    Hypertension Mother    Cancer Father        colon   Kidney disease Father    Arthritis Father    Arthritis Brother    Alcohol abuse Maternal Aunt    Diabetes Maternal Aunt    Alcohol abuse Maternal Uncle    Diabetes Paternal Aunt    Alcohol abuse Maternal Uncle    Alcohol abuse Maternal Uncle      Prior to Admission medications   Medication Sig Start Date End Date Taking? Authorizing Provider  Accu-Chek Softclix Lancets lancets Use to obtain blood sugar sample once daily 02/28/23   Karie Schwalbe, MD  acetaminophen (TYLENOL) 325 MG tablet Take 650 mg by mouth every 6 (six) hours as needed for mild pain.    [provider]  amLODipine (NORVASC) 10 MG tablet Take 1 tablet (10 mg total) by mouth daily. 02/14/23   Karie Schwalbe, MD  aspirin EC 81 MG tablet Take 1 tablet (81 mg total) by mouth daily. Swallow whole. 11/12/22   Alford Highland, MD  calcium carbonate (TUMS) 500 MG chewable tablet Chew 1 tablet (200 mg of elemental calcium total) by mouth 2 (two) times daily as needed for indigestion or heartburn. 06/26/16   Joaquim Nam, MD  furosemide (LASIX) 40 MG tablet Take 40 mg by mouth  daily. Patient not taking: Reported on 02/27/2023 01/05/23   [provider]  glucose blood (ACCU-CHEK GUIDE) test strip Use to check blood sugar once daily 02/28/23   Tillman Abide I, MD  lisinopril (ZESTRIL) 40 MG tablet Take 1 tablet (40 mg total) by mouth daily. 02/14/23   Karie Schwalbe, MD  Multiple Vitamin (MULTIVITAMIN WITH MINERALS) TABS tablet Take 1 tablet by mouth daily. 11/07/21   Kathlen Mody, MD  mycophenolate (CELLCEPT) 250 MG capsule Take 250-500 mg by mouth 2 (two) times daily. 2 capsules (500 mg) in the morning and one capsule (250 mg) in the evening 02/04/19   [provider]  omeprazole (PRILOSEC) 20 MG capsule TAKE 1 CAPSULE(20 MG) BY MOUTH DAILY 01/28/23   Alphonsus Sias,  Richard I, MD  polyethylene glycol (MIRALAX / GLYCOLAX) 17 g packet Take 17 g by mouth daily as needed for mild constipation. Patient not taking: Reported on 02/27/2023 11/11/22   Alford Highland, MD  predniSONE (DELTASONE) 10 MG tablet Take 10 mg by mouth daily with breakfast.    [provider]  rosuvastatin (CRESTOR) 20 MG tablet Take 1 tablet (20 mg total) by mouth daily. 12/27/22   Karie Schwalbe, MD  tacrolimus (PROGRAF) 0.5 MG capsule Take 0.5-1 mg by mouth See admin instructions. Taking 0.5 mg in the morning and 1 mg at bedtime    [provider]  tamsulosin (FLOMAX) 0.4 MG CAPS capsule TAKE 1 CAPSULE(0.4 MG) BY MOUTH DAILY 12/02/22   Karie Schwalbe, MD    Physical Exam: Vitals:   04/18/23 2330 04/18/23 2345 04/19/23 0000 04/19/23 0015  BP: (!) 141/86 (!) 130/91 135/89   Pulse: 69 69 68 61  Resp:      Temp:      TempSrc:      SpO2: 97%     Weight:      Height:       General: Not in acute distress HEENT:       Eyes: PERRL, EOMI, no jaundice       ENT: No discharge from the ears and nose, no pharynx injection, no tonsillar enlargement.        Neck: No JVD, no bruit, no mass felt. Heme: No neck lymph node enlargement. Cardiac: S1/S2, RRR, No murmurs, No  gallops or rubs. Respiratory: No rales, wheezing, rhonchi or rubs. GI: Soft, nondistended, nontender, no rebound pain, no organomegaly, BS present. GU: No hematuria Ext: No pitting leg edema bilaterally. 1+DP/PT pulse bilaterally. Musculoskeletal: No joint deformities, No joint redness or warmth, no limitation of ROM in spin. Skin: No rashes.  Neuro: Alert, oriented X3, cranial nerves II-XII grossly intact, Muscle strength 4/5 in left arm and leg, 5/5 in right extremities, sensation to light touch intact.  Psych: Patient is not psychotic, no suicidal or hemocidal ideation.  Labs on Admission: I have personally reviewed following labs and imaging studies  CBC: Recent Labs  Lab 04/18/23 1457  WBC 14.1*  HGB 14.1  HCT 41.1  MCV 94.3  PLT 190   Basic Metabolic Panel: Recent Labs  Lab 04/18/23 1457 04/18/23 2016 04/18/23 2325  NA 131* 136 136  K 4.3 4.2 3.9  CL 97* 106 106  CO2 19* 23 23  GLUCOSE 676* 284* 235*  BUN 24* 23 20  CREATININE 2.18* 1.85* 1.77*  CALCIUM 8.9 8.4* 8.3*   GFR: Estimated Creatinine Clearance: 56.5 mL/min (A) (by C-G formula based on SCr of 1.77 mg/dL (H)). Liver Function Tests: Recent Labs  Lab 04/18/23 1457  AST 50*  ALT 60*  ALKPHOS 62  BILITOT 2.8*  PROT 6.6  ALBUMIN 3.9   No results for input(s): "LIPASE", "AMYLASE" in the last 168 hours. No results for input(s): "AMMONIA" in the last 168 hours. Coagulation Profile: No results for input(s): "INR", "PROTIME" in the last 168 hours. Cardiac Enzymes: No results for input(s): "CKTOTAL", "CKMB", "CKMBINDEX", "TROPONINI" in the last 168 hours. BNP (last 3 results) No results for input(s): "PROBNP" in the last 8760 hours. HbA1C: No results for input(s): "HGBA1C" in the last 72 hours. CBG: Recent Labs  Lab 04/18/23 2011 04/18/23 2122 04/18/23 2230 04/18/23 2323 04/19/23 0026  GLUCAP 228* 250* 220* 213* 188*   Lipid Profile: No results for input(s): "CHOL", "HDL", "LDLCALC", "TRIG",  "CHOLHDL", "  LDLDIRECT" in the last 72 hours. Thyroid Function Tests: No results for input(s): "TSH", "T4TOTAL", "FREET4", "T3FREE", "THYROIDAB" in the last 72 hours. Anemia Panel: No results for input(s): "VITAMINB12", "FOLATE", "FERRITIN", "TIBC", "IRON", "RETICCTPCT" in the last 72 hours. Urine analysis:    Component Value Date/Time   COLORURINE YELLOW (A) 04/18/2023 1457   APPEARANCEUR CLEAR (A) 04/18/2023 1457   APPEARANCEUR Clear 11/30/2013 1959   LABSPEC 1.025 04/18/2023 1457   LABSPEC 1.017 11/30/2013 1959   PHURINE 5.0 04/18/2023 1457   GLUCOSEU >=500 (A) 04/18/2023 1457   GLUCOSEU Negative 11/30/2013 1959   HGBUR NEGATIVE 04/18/2023 1457   HGBUR negative 05/25/2010 1153   BILIRUBINUR NEGATIVE 04/18/2023 1457   BILIRUBINUR Negative 11/30/2013 1959   KETONESUR NEGATIVE 04/18/2023 1457   PROTEINUR NEGATIVE 04/18/2023 1457   UROBILINOGEN 0.2 08/25/2012 2256   NITRITE NEGATIVE 04/18/2023 1457   LEUKOCYTESUR NEGATIVE 04/18/2023 1457   LEUKOCYTESUR Negative 11/30/2013 1959   Sepsis Labs: @LABRCNTIP (procalcitonin:4,lacticidven:4) )No results found for this or any previous visit (from the past 240 hour(s)).   Radiological Exams on Admission: US ABDOMEN LIMITED RUQ (LIVER/GB)  Result Date: 04/18/2023 CLINICAL DATA:  Transaminitis EXAM: ULTRASOUND ABDOMEN LIMITED RIGHT UPPER QUADRANT COMPARISON:  None Available. FINDINGS: Gallbladder: Prior cholecystectomy. Common bile duct: Diameter: 7 mm Liver: No focal lesion identified. Increased parenchymal echogenicity. Portal vein is patent on color Doppler imaging with normal direction of blood flow towards the liver. Other: None. IMPRESSION: 1.  Hepatic steatosis. 2. Mildly dilated common bile duct, within normal limits status post cholecystectomy. Electronically Signed   By: Allegra Lai M.D.   On: 04/18/2023 20:21   CT Head Wo Contrast  Result Date: 04/18/2023 CLINICAL DATA:  Dizziness, blurred vision, stroke suspected EXAM: CT HEAD  WITHOUT CONTRAST TECHNIQUE: Contiguous axial images were obtained from the base of the skull through the vertex without intravenous contrast. RADIATION DOSE REDUCTION: This exam was performed according to the departmental dose-optimization program which includes automated exposure control, adjustment of the mA and/or kV according to patient size and/or use of iterative reconstruction technique. COMPARISON:  11/07/2022 CT head FINDINGS: Brain: No evidence of acute infarction, hemorrhage, mass, mass effect, or midline shift. No hydrocephalus or extra-axial fluid collection. Periventricular white matter changes, likely the sequela of chronic small vessel ischemic disease. Vascular: No hyperdense vessel. Skull: Negative for fracture or focal lesion. Sinuses/Orbits: No acute finding. Other: The mastoid air cells are well aerated. IMPRESSION: No acute intracranial process. Electronically Signed   By: Wiliam Ke M.D.   On: 04/18/2023 17:27      Assessment/Plan Principal Problem:   Hyperosmolar hyperglycemic state (HHS) (HCC) Active Problems:   Left sided numbness   Type II diabetes mellitus with renal manifestations (HCC)   Chest pain   hx of stroke   Acute renal failure superimposed on stage 3b chronic kidney disease (HCC)   History of liver transplant (HCC)   Chronic diastolic heart failure (HCC)   Leukocytosis   BPH (benign prostatic hyperplasia)   Hypertension   HLD (hyperlipidemia)   Obesity (BMI 30-39.9)   Assessment and Plan:  Hyperosmolar hyperglycemic state (HHS) and Type II diabetes mellitus with renal manifestations: blood sugar 676, anion gap 15, bicarbonate 19, pH 7.3 by VBG, negative ketone in UA.  Mental status normal.  Recent A1c 7.0.  - Admit to stepdown as inpt - IVF:  2.4 L of LR and 1L of NS bolus - start insulin drip with BMP q4h - IVF: LR at 75 cc/h, will switch to D5-LR at  75 cc/h when CBG<250 - replete K as needed - Zofran prn nausea  - NPO  - consult to diabetic  educator  Left sided numbness and weakness and hx of stroke: pt resports left-sided numbness and worsening weakness in left arm and leg.  CT head negative. -Continue aspirin, Crestor -Follow-up MRI of brain to rule out new stroke -PT/OT  Chest pain: Patient reports shortness of breath and chest pain, which is pressure-like, nonpleuritic, low suspicions of PE. -As needed nitroglycerin -Aspirin, Crestor -Trend troponin -Check A1c, FLP -f/u CXR -f/u Covid19 pcr  Acute renal failure superimposed on stage 3b chronic kidney disease (HCC): Likely due to dehydration and continuation of lisinopril and diuretics. -Hold Lasix and lisinopril -I fluid as above  History of liver transplant (HCC) -Continue home prednisone 10 mg daily, tacrolimus and CellCept -Give 1 dose of Solu-Cortef 50 mg as stress dose -Carefully use as needed Tylenol at low-dose (patient can only use NSAIDs due to worsening renal function)  Chronic diastolic heart failure (HCC): 2D echo on 11/07/2022 showed EF of 60-85% with grade 1 diastolic dysfunction.  Patient reports shortness breath, but no leg edema or JVD.  BNP 24.1, CHF is compensated. -Hold Lasix due to worsening renal function and HHS  Leukocytosis: WBC 14.1, no fever, no signs of infection identified.  Likely due to steroid use -Follow-up with CBC  BPH (benign prostatic hyperplasia): -Flomax  Hypertension -IV hydralazine as needed -Hold lisinopril due to worsening renal function -Amlodipine  HLD (hyperlipidemia) -Crestor  Obesity (BMI 30-39.9): Body weight 120.2 kg, BMI 34.96 -Exercise in healthy diet -Encourage losing weight       DVT ppx:  SQ Lovenox  Code Status: Full code   Family Communication: not done, no family member is at bed side.     Disposition Plan:  Anticipate discharge back to previous environment  Consults called:  none  Admission status and Level of care: Stepdown:   as inpt     Dispo: The patient is from: Home               Anticipated d/c is to: Home              Anticipated d/c date is: 2 days              Patient currently is not medically stable to d/c.    Severity of Illness:  The appropriate patient status for this patient is INPATIENT. Inpatient status is judged to be reasonable and necessary in order to provide the required intensity of service to ensure the patient's safety. The patient's presenting symptoms, physical exam findings, and initial radiographic and laboratory data in the context of their chronic comorbidities is felt to place them at high risk for further clinical deterioration. Furthermore, it is not anticipated that the patient will be medically stable for discharge from the hospital within 2 midnights of admission.   * I certify that at the point of admission it is my clinical judgment that the patient will require inpatient hospital care spanning beyond 2 midnights from the point of admission due to high intensity of service, high risk for further deterioration and high frequency of surveillance required.*       Date of Service 04/19/2023    Lorretta Harp [facility]   If 7PM-7AM, please contact night-coverage [email or web address] 04/19/2023, 12:59 AM

## 2023-04-19 ENCOUNTER — Encounter: Payer: Self-pay | Admitting: Radiology

## 2023-04-19 ENCOUNTER — Inpatient Hospital Stay: Payer: 59

## 2023-04-19 DIAGNOSIS — E1100 Type 2 diabetes mellitus with hyperosmolarity without nonketotic hyperglycemic-hyperosmolar coma (NKHHC): Secondary | ICD-10-CM | POA: Diagnosis not present

## 2023-04-19 LAB — CBG MONITORING, ED
Glucose-Capillary: 167 mg/dL — ABNORMAL HIGH (ref 70–99)
Glucose-Capillary: 173 mg/dL — ABNORMAL HIGH (ref 70–99)
Glucose-Capillary: 177 mg/dL — ABNORMAL HIGH (ref 70–99)
Glucose-Capillary: 185 mg/dL — ABNORMAL HIGH (ref 70–99)
Glucose-Capillary: 187 mg/dL — ABNORMAL HIGH (ref 70–99)
Glucose-Capillary: 188 mg/dL — ABNORMAL HIGH (ref 70–99)
Glucose-Capillary: 189 mg/dL — ABNORMAL HIGH (ref 70–99)
Glucose-Capillary: 192 mg/dL — ABNORMAL HIGH (ref 70–99)
Glucose-Capillary: 202 mg/dL — ABNORMAL HIGH (ref 70–99)

## 2023-04-19 LAB — BASIC METABOLIC PANEL
Anion gap: 7 (ref 5–15)
Anion gap: 7 (ref 5–15)
Anion gap: 6 (ref 5–15)
Anion gap: 8 (ref 5–15)
BUN: 20 mg/dL (ref 8–23)
BUN: 21 mg/dL (ref 8–23)
BUN: 20 mg/dL (ref 8–23)
BUN: 20 mg/dL (ref 8–23)
CO2: 24 mmol/L (ref 22–32)
CO2: 24 mmol/L (ref 22–32)
CO2: 24 mmol/L (ref 22–32)
CO2: 24 mmol/L (ref 22–32)
Calcium: 8.5 mg/dL — ABNORMAL LOW (ref 8.9–10.3)
Calcium: 8.5 mg/dL — ABNORMAL LOW (ref 8.9–10.3)
Calcium: 8.5 mg/dL — ABNORMAL LOW (ref 8.9–10.3)
Calcium: 8.6 mg/dL — ABNORMAL LOW (ref 8.9–10.3)
Chloride: 108 mmol/L (ref 98–111)
Chloride: 108 mmol/L (ref 98–111)
Chloride: 108 mmol/L (ref 98–111)
Chloride: 107 mmol/L (ref 98–111)
Creatinine, Ser: 1.67 mg/dL — ABNORMAL HIGH (ref 0.61–1.24)
Creatinine, Ser: 1.72 mg/dL — ABNORMAL HIGH (ref 0.61–1.24)
Creatinine, Ser: 1.53 mg/dL — ABNORMAL HIGH (ref 0.61–1.24)
Creatinine, Ser: 1.6 mg/dL — ABNORMAL HIGH (ref 0.61–1.24)
GFR, Estimated: 45 mL/min — ABNORMAL LOW (ref >60)
GFR, Estimated: 44 mL/min — ABNORMAL LOW (ref >60)
GFR, Estimated: 50 mL/min — ABNORMAL LOW (ref >60)
GFR, Estimated: 48 mL/min — ABNORMAL LOW (ref >60)
Glucose, Bld: 186 mg/dL — ABNORMAL HIGH (ref 70–99)
Glucose, Bld: 207 mg/dL — ABNORMAL HIGH (ref 70–99)
Glucose, Bld: 218 mg/dL — ABNORMAL HIGH (ref 70–99)
Glucose, Bld: 230 mg/dL — ABNORMAL HIGH (ref 70–99)
Potassium: 4 mmol/L (ref 3.5–5.1)
Potassium: 3.8 mmol/L (ref 3.5–5.1)
Potassium: 4.1 mmol/L (ref 3.5–5.1)
Potassium: 4.2 mmol/L (ref 3.5–5.1)
Sodium: 139 mmol/L (ref 135–145)
Sodium: 139 mmol/L (ref 135–145)
Sodium: 138 mmol/L (ref 135–145)
Sodium: 139 mmol/L (ref 135–145)

## 2023-04-19 LAB — LIPID PANEL
Cholesterol: 121 mg/dL (ref 0–200)
HDL: 37 mg/dL — ABNORMAL LOW (ref >40)
LDL Cholesterol: UNDETERMINED mg/dL (ref 0–99)
Total CHOL/HDL Ratio: 3.3 {ratio}
Triglycerides: 436 mg/dL — ABNORMAL HIGH (ref <150)
VLDL: UNDETERMINED mg/dL (ref 0–40)

## 2023-04-19 LAB — HEMOGLOBIN A1C
Hgb A1c MFr Bld: 10.6 % — ABNORMAL HIGH (ref 4.8–5.6)
Mean Plasma Glucose: 257.52 mg/dL

## 2023-04-19 LAB — TROPONIN I (HIGH SENSITIVITY)
Troponin I (High Sensitivity): 17 ng/L (ref <18)
Troponin I (High Sensitivity): 16 ng/L (ref <18)
Troponin I (High Sensitivity): 14 ng/L (ref <18)

## 2023-04-19 LAB — GLUCOSE, CAPILLARY
Glucose-Capillary: 345 mg/dL — ABNORMAL HIGH (ref 70–99)
Glucose-Capillary: 241 mg/dL — ABNORMAL HIGH (ref 70–99)

## 2023-04-19 LAB — APTT: aPTT: 20 s — ABNORMAL LOW (ref 24–36)

## 2023-04-19 LAB — PROTIME-INR
INR: 1 (ref 0.8–1.2)
Prothrombin Time: 13.1 s (ref 11.4–15.2)

## 2023-04-19 LAB — TSH: TSH: 0.855 u[IU]/mL (ref 0.350–4.500)

## 2023-04-19 LAB — SARS CORONAVIRUS 2 BY RT PCR: SARS Coronavirus 2 by RT PCR: NEGATIVE

## 2023-04-19 MED ORDER — PANTOPRAZOLE SODIUM 40 MG PO TBEC
40.0000 mg | DELAYED_RELEASE_TABLET | Freq: Every day | ORAL | Status: DC
  Administered 2023-04-19 – 2023-04-21 (×3): 40 mg via ORAL
  Filled 2023-04-19 (×3): qty 1

## 2023-04-19 MED ORDER — PREDNISONE 10 MG PO TABS
10.0000 mg | ORAL_TABLET | Freq: Every day | ORAL | Status: DC
  Administered 2023-04-19 – 2023-04-21 (×3): 10 mg via ORAL
  Filled 2023-04-19 (×3): qty 1

## 2023-04-19 MED ORDER — INSULIN ASPART 100 UNIT/ML IJ SOLN
0.0000 [IU] | Freq: Three times a day (TID) | INTRAMUSCULAR | Status: DC
Start: 2023-04-19 — End: 2023-04-21
  Administered 2023-04-19 (×2): 5 [IU] via SUBCUTANEOUS
  Administered 2023-04-19: 11 [IU] via SUBCUTANEOUS
  Administered 2023-04-20: 8 [IU] via SUBCUTANEOUS
  Administered 2023-04-20: 3 [IU] via SUBCUTANEOUS
  Administered 2023-04-20: 11 [IU] via SUBCUTANEOUS
  Administered 2023-04-20: 15 [IU] via SUBCUTANEOUS
  Administered 2023-04-21 (×2): 5 [IU] via SUBCUTANEOUS
  Filled 2023-04-19 (×10): qty 1

## 2023-04-19 MED ORDER — TRAMADOL HCL 50 MG PO TABS
50.0000 mg | ORAL_TABLET | Freq: Three times a day (TID) | ORAL | Status: DC | PRN
  Administered 2023-04-19 – 2023-04-20 (×2): 50 mg via ORAL
  Filled 2023-04-19 (×2): qty 1

## 2023-04-19 MED ORDER — TACROLIMUS 0.5 MG PO CAPS
0.5000 mg | ORAL_CAPSULE | Freq: Every day | ORAL | Status: DC
  Administered 2023-04-19 – 2023-04-21 (×3): 0.5 mg via ORAL
  Filled 2023-04-19 (×3): qty 1

## 2023-04-19 MED ORDER — ROSUVASTATIN CALCIUM 10 MG PO TABS
20.0000 mg | ORAL_TABLET | Freq: Every day | ORAL | Status: DC
  Administered 2023-04-19 – 2023-04-21 (×3): 20 mg via ORAL
  Filled 2023-04-19: qty 2
  Filled 2023-04-19 (×2): qty 1
  Filled 2023-04-19 (×2): qty 2

## 2023-04-19 MED ORDER — CALCIUM CARBONATE ANTACID 500 MG PO CHEW: 1.0000 | CHEWABLE_TABLET | Freq: Two times a day (BID) | ORAL | Status: DC | PRN

## 2023-04-19 MED ORDER — ADULT MULTIVITAMIN W/MINERALS CH
1.0000 | ORAL_TABLET | Freq: Every day | ORAL | Status: DC
Start: 2023-04-20 — End: 2023-04-21
  Administered 2023-04-20 – 2023-04-21 (×2): 1 via ORAL
  Filled 2023-04-19 (×2): qty 1

## 2023-04-19 MED ORDER — INSULIN GLARGINE-YFGN 100 UNIT/ML ~~LOC~~ SOLN
15.0000 [IU] | Freq: Every day | SUBCUTANEOUS | Status: DC
Start: 2023-04-20 — End: 2023-04-20
  Filled 2023-04-19: qty 0.15

## 2023-04-19 MED ORDER — MYCOPHENOLATE MOFETIL 250 MG PO CAPS: 250.0000 mg | ORAL_CAPSULE | Freq: Two times a day (BID) | ORAL | Status: DC

## 2023-04-19 MED ORDER — MYCOPHENOLATE MOFETIL 250 MG PO CAPS
500.0000 mg | ORAL_CAPSULE | Freq: Every day | ORAL | Status: DC
  Administered 2023-04-19 – 2023-04-21 (×3): 500 mg via ORAL
  Filled 2023-04-19 (×3): qty 2

## 2023-04-19 MED ORDER — ASPIRIN 81 MG PO TBEC
81.0000 mg | DELAYED_RELEASE_TABLET | Freq: Every day | ORAL | Status: DC
  Administered 2023-04-19 – 2023-04-21 (×3): 81 mg via ORAL
  Filled 2023-04-19 (×3): qty 1

## 2023-04-19 MED ORDER — NORTRIPTYLINE HCL 10 MG PO CAPS
10.0000 mg | ORAL_CAPSULE | Freq: Two times a day (BID) | ORAL | Status: DC
  Administered 2023-04-19 – 2023-04-21 (×4): 10 mg via ORAL
  Filled 2023-04-19 (×5): qty 1

## 2023-04-19 MED ORDER — LIVING WELL WITH DIABETES BOOK
Freq: Once | Status: AC
  Filled 2023-04-19: qty 1

## 2023-04-19 MED ORDER — TACROLIMUS 0.5 MG PO CAPS: 0.5000 mg | ORAL_CAPSULE | ORAL | Status: DC

## 2023-04-19 MED ORDER — TAMSULOSIN HCL 0.4 MG PO CAPS
0.4000 mg | ORAL_CAPSULE | Freq: Every day | ORAL | Status: DC
  Administered 2023-04-19 – 2023-04-21 (×3): 0.4 mg via ORAL
  Filled 2023-04-19 (×3): qty 1

## 2023-04-19 MED ORDER — INSULIN GLARGINE-YFGN 100 UNIT/ML ~~LOC~~ SOLN
10.0000 [IU] | Freq: Every day | SUBCUTANEOUS | Status: DC
  Administered 2023-04-19: 10 [IU] via SUBCUTANEOUS
  Filled 2023-04-19: qty 0.1

## 2023-04-19 MED ORDER — GABAPENTIN 300 MG PO CAPS
300.0000 mg | ORAL_CAPSULE | Freq: Three times a day (TID) | ORAL | Status: DC
  Administered 2023-04-19 – 2023-04-21 (×7): 300 mg via ORAL
  Filled 2023-04-19 (×7): qty 1

## 2023-04-19 MED ORDER — MYCOPHENOLATE MOFETIL 250 MG PO CAPS
250.0000 mg | ORAL_CAPSULE | Freq: Every day | ORAL | Status: DC
  Administered 2023-04-19 – 2023-04-20 (×3): 250 mg via ORAL
  Filled 2023-04-19 (×4): qty 1

## 2023-04-19 MED ORDER — TRIMETHOPRIM 100 MG PO TABS
100.0000 mg | ORAL_TABLET | Freq: Every day | ORAL | Status: DC
  Administered 2023-04-19 – 2023-04-21 (×3): 100 mg via ORAL
  Filled 2023-04-19 (×5): qty 1

## 2023-04-19 MED ORDER — NITROGLYCERIN 0.4 MG SL SUBL: 0.4000 mg | SUBLINGUAL_TABLET | SUBLINGUAL | Status: DC | PRN

## 2023-04-19 MED ORDER — AMLODIPINE BESYLATE 10 MG PO TABS
10.0000 mg | ORAL_TABLET | Freq: Every day | ORAL | Status: DC
  Administered 2023-04-19 – 2023-04-21 (×3): 10 mg via ORAL
  Filled 2023-04-19: qty 1
  Filled 2023-04-19: qty 2
  Filled 2023-04-19: qty 1

## 2023-04-19 MED ORDER — TACROLIMUS 1 MG PO CAPS
1.0000 mg | ORAL_CAPSULE | Freq: Every day | ORAL | Status: DC
  Administered 2023-04-19 – 2023-04-20 (×3): 1 mg via ORAL
  Filled 2023-04-19 (×4): qty 1

## 2023-04-19 MED ORDER — ENSURE ENLIVE PO LIQD: 237.0000 mL | Freq: Two times a day (BID) | ORAL | Status: DC

## 2023-04-19 MED ORDER — INSULIN STARTER KIT- PEN NEEDLES (ENGLISH)
1.0000 | Freq: Once | Status: DC
  Filled 2023-04-19 (×2): qty 1

## 2023-04-19 MED ORDER — HYDROCORTISONE SOD SUC (PF) 100 MG IJ SOLR
50.0000 mg | Freq: Once | INTRAMUSCULAR | Status: AC
  Administered 2023-04-19: 50 mg via INTRAVENOUS
  Filled 2023-04-19: qty 1

## 2023-04-19 NOTE — ED Notes (Signed)
Waiting on pharmacy to send Semglee insulin

## 2023-04-19 NOTE — ED Notes (Signed)
Pt returned from MRI, pt placed back on monitor and insulin drip and IV fluids placed back on regular pumps.

## 2023-04-19 NOTE — Evaluation (Signed)
Occupational Therapy Evaluation Patient Details Name: Devin White MRN: [id number] DOB: August 02, 1955 Today's Date: 04/19/2023   History of Present Illness Pt is a 67 y.o. male with medical history significant of liver cirrhosis (s/p of liver transplantation 2009, on immunosuppressant), HTN, HLD, DM not on medications, diastolic CHF, stroke, gout, GERD, BPH, CKD-3B, IBS, who presents with dizziness, blurry vision, left-sided numbness and weakness, presenting to ED on 04/18/23 for SOB, chest pain. Found to have BGL 676 on admission   Clinical Impression   Mr Study was seen for OT evaluation this date. Prior to hospital admission, pt was MOD I using rolator as needed. Pt lives alone in house with 4 STE. Pt currently MOD I for bed mobility and to don socks figure four position in bed. SBA + RW for ADL t/f ~50 ft. Educated on falls prevention strategies and ECS. Pt would benefit from skilled OT to address noted impairments and functional limitations (see below for any additional details). Upon hospital discharge, recommend OT follow up.    If plan is discharge home, recommend the following: A little help with walking and/or transfers;A little help with bathing/dressing/bathroom;Help with stairs or ramp for entrance    Functional Status Assessment  Patient has had a recent decline in their functional status and demonstrates the ability to make significant improvements in function in a reasonable and predictable amount of time.  Equipment Recommendations  None recommended by OT    Recommendations for Other Services       Precautions / Restrictions Precautions Precautions: None Restrictions Weight Bearing Restrictions: No      Mobility Bed Mobility Overal bed mobility: Modified Independent             General bed mobility comments: bed controls    Transfers Overall transfer level: Needs assistance Equipment used: Rolling walker (2 wheels) Transfers: Sit to/from Stand Sit to  Stand: Supervision                  Balance Overall balance assessment: Needs assistance Sitting-balance support: Feet supported, Single extremity supported Sitting balance-Leahy Scale: Good     Standing balance support: No upper extremity supported, During functional activity Standing balance-Leahy Scale: Fair                             ADL either performed or assessed with clinical judgement   ADL Overall ADL's : Needs assistance/impaired                                       General ADL Comments: MOD I don socks figure four position in bed. SBA + RW for ADL t/f ~50 ft.      Pertinent Vitals/Pain Pain Assessment Pain Assessment: 0-10 Pain Score: 5  Pain Location: L shoulder from pitched nerve Pain Descriptors / Indicators: Constant, Nagging Pain Intervention(s): Limited activity within patient's tolerance, Repositioned     Extremity/Trunk Assessment Upper Extremity Assessment Upper Extremity Assessment: Overall WFL for tasks assessed   Lower Extremity Assessment Lower Extremity Assessment: Generalized weakness       Communication Communication Communication: No apparent difficulties   Cognition Arousal: Alert Behavior During Therapy: WFL for tasks assessed/performed Overall Cognitive Status: Within Functional Limits for tasks assessed  General Comments  Following mobility seated BP 151/90, MAP 104, HR 98, Spo2 96% on RA            Home Living Family/patient expects to be discharged to:: Private residence Living Arrangements: Alone Available Help at Discharge: Family;Available PRN/intermittently (daughter in [place]) Type of Home: Mobile home Home Access: Stairs to enter Entrance Stairs-Number of Steps: 4 Entrance Stairs-Rails: Right Home Layout: One level     Bathroom Shower/Tub: Chief Strategy Officer: Standard     Home Equipment: Rollator (4  wheels)          Prior Functioning/Environment Prior Level of Function : Independent/Modified Independent;Driving             Mobility Comments: Pt reports intermittent use of rollator depending on dizziness/weakness ADLs Comments: Independent with ADL/IADLs        OT Problem List: Decreased activity tolerance;Decreased strength;Decreased range of motion;Impaired balance (sitting and/or standing)      OT Treatment/Interventions: Self-care/ADL training;Therapeutic exercise;Energy conservation;DME and/or AE instruction;Therapeutic activities;Balance training    OT Goals(Current goals can be found in the care plan section) Acute Rehab OT Goals Patient Stated Goal: to go home OT Goal Formulation: With patient Time For Goal Achievement: 05/03/23 Potential to Achieve Goals: Good ADL Goals Pt Will Perform Grooming: with modified independence;standing Pt Will Transfer to Toilet: with modified independence;ambulating;regular height toilet  OT Frequency: Min 1X/week    Co-evaluation              AM-PAC OT "6 Clicks" Daily Activity     Outcome Measure Help from another person eating meals?: None Help from another person taking care of personal grooming?: A Little Help from another person toileting, which includes using toliet, bedpan, or urinal?: A Little Help from another person bathing (including washing, rinsing, drying)?: A Little Help from another person to put on and taking off regular upper body clothing?: None Help from another person to put on and taking off regular lower body clothing?: None 6 Click Score: 21   End of Session Equipment Utilized During Treatment: Rolling walker (2 wheels)  Activity Tolerance: Patient tolerated treatment well Patient left: in bed;with call bell/phone within reach  OT Visit Diagnosis: Other abnormalities of gait and mobility (R26.89)                Time: 1191-4782 OT Time Calculation (min): 18 min Charges:  OT General  Charges $OT Visit: 1 Visit OT Evaluation $OT Eval Moderate Complexity: 1 Mod  Kathie Dike, M.S. OTR/L  04/19/23, 12:58 PM  ascom [phone]

## 2023-04-19 NOTE — Progress Notes (Signed)
       CROSS COVER NOTE  NAME: Devin White MRN: [id number] DOB : 02-Aug-1955    Concern as stated by nurse / staff    HHNK    Pertinent findings on chart review: Glucose in call Incidental finding prior tsh elevation above 8. Given hyperglycemia, should be reevaluated  Assessment and  Interventions   Assessment:  Plan: Transition to SQ insulin TSH added to am labs X X      Donnie Mesa NP [facility] Cross Cover 7pm-7am - check amion for availability Pager [phone]

## 2023-04-19 NOTE — Discharge Instructions (Signed)

## 2023-04-19 NOTE — ED Notes (Addendum)
Pt left to MRI, pt left on MRI safe pumps for fluids and insulin drip.

## 2023-04-19 NOTE — Progress Notes (Signed)
[facility]  - [place] at [facility]   PATIENT NAME: Devin White    MR#:  [id number]  DATE OF BIRTH:  [birth year]  SUBJECTIVE:      VITALS:  Blood pressure 138/84, pulse 69, temperature 97.9 F (36.6 C), resp. rate 18, height 6\' 1"  (1.854 m), weight 120.2 kg, SpO2 100%.  PHYSICAL EXAMINATION:   GENERAL:  67 y.o.-year-old patient with no acute distress.  LUNGS: Normal breath sounds bilaterally, no wheezing CARDIOVASCULAR: S1, S2 normal. No murmur   ABDOMEN: Soft, nontender, nondistended. Bowel sounds present.  EXTREMITIES: No  edema b/l.    NEUROLOGIC: nonfocal  patient is alert and awake SKIN: No obvious rash, lesion, or ulcer.   LABORATORY PANEL:  CBC Recent Labs  Lab 04/18/23 1457  WBC 14.1*  HGB 14.1  HCT 41.1  PLT 190    Chemistries  Recent Labs  Lab 04/18/23 1457 04/18/23 2016 04/19/23 0753  NA 131*   < > 139  K 4.3   < > 4.2  CL 97*   < > 107  CO2 19*   < > 24  GLUCOSE 676*   < > 230*  BUN 24*   < > 20  CREATININE 2.18*   < > 1.60*  CALCIUM 8.9   < > 8.6*  AST 50*  --   --   ALT 60*  --   --   ALKPHOS 62  --   --   BILITOT 2.8*  --   --    < > = values in this interval not displayed.   Cardiac Enzymes No results for input(s): "TROPONINI" in the last 168 hours. RADIOLOGY:  MR BRAIN WO CONTRAST  Result Date: 04/19/2023 CLINICAL DATA:  Initial evaluation for neuro deficit, stroke suspected. EXAM: MRI HEAD WITHOUT CONTRAST TECHNIQUE: Multiplanar, multiecho pulse sequences of the brain and surrounding structures were obtained without intravenous contrast. COMPARISON:  Prior CT from 04/18/2023. FINDINGS: Brain: Cerebral volume within normal limits for age. Patchy and confluent T2/FLAIR hyperintensity involving the periventricular deep white matter both cerebral hemispheres, consistent with chronic small vessel ischemic disease, moderately advanced in nature. No abnormal foci of restricted diffusion to suggest acute or subacute ischemia.  Gray-white matter differentiation maintained. No areas of chronic cortical infarction. No acute intracranial hemorrhage. Single punctate chronic microhemorrhage noted at the left occipital lobe, of doubtful significance in isolation. No mass lesion, midline shift or mass effect. No hydrocephalus or extra-axial fluid collection. Pituitary gland and suprasellar region within normal limits. Vascular: Major intracranial vascular flow voids are maintained. Skull and upper cervical spine: Cranial junction within normal limits. Bone marrow signal intensity normal. No scalp soft tissue abnormality. Sinuses/Orbits: Globes and orbital soft tissues within normal limits. Mild scattered mucosal thickening noted about the paranasal sinuses. Superimposed left maxillary sinus retention cyst. No significant mastoid effusion. Other: None. IMPRESSION: 1. No acute intracranial abnormality. 2. Moderately advanced chronic microvascular ischemic disease for age. Electronically Signed   By: Rise Mu M.D.   On: 04/19/2023 02:14   DG Chest Port 1 View  Result Date: 04/19/2023 CLINICAL DATA:  Chest pain EXAM: PORTABLE CHEST 1 VIEW COMPARISON:  11/15/2022 FINDINGS: The heart size and mediastinal contours are within normal limits. Both lungs are clear. The visualized skeletal structures are unremarkable. IMPRESSION: No active disease. Electronically Signed   By: Charlett Nose M.D.   On: 04/19/2023 01:02   US ABDOMEN LIMITED RUQ (LIVER/GB)  Result Date: 04/18/2023 CLINICAL DATA:  Transaminitis EXAM: ULTRASOUND ABDOMEN LIMITED RIGHT UPPER  QUADRANT COMPARISON:  None Available. FINDINGS: Gallbladder: Prior cholecystectomy. Common bile duct: Diameter: 7 mm Liver: No focal lesion identified. Increased parenchymal echogenicity. Portal vein is patent on color Doppler imaging with normal direction of blood flow towards the liver. Other: None. IMPRESSION: 1.  Hepatic steatosis. 2. Mildly dilated common bile duct, within normal limits  status post cholecystectomy. Electronically Signed   By: Allegra Lai M.D.   On: 04/18/2023 20:21   CT Head Wo Contrast  Result Date: 04/18/2023 CLINICAL DATA:  Dizziness, blurred vision, stroke suspected EXAM: CT HEAD WITHOUT CONTRAST TECHNIQUE: Contiguous axial images were obtained from the base of the skull through the vertex without intravenous contrast. RADIATION DOSE REDUCTION: This exam was performed according to the departmental dose-optimization program which includes automated exposure control, adjustment of the mA and/or kV according to patient size and/or use of iterative reconstruction technique. COMPARISON:  11/07/2022 CT head FINDINGS: Brain: No evidence of acute infarction, hemorrhage, mass, mass effect, or midline shift. No hydrocephalus or extra-axial fluid collection. Periventricular white matter changes, likely the sequela of chronic small vessel ischemic disease. Vascular: No hyperdense vessel. Skull: Negative for fracture or focal lesion. Sinuses/Orbits: No acute finding. Other: The mastoid air cells are well aerated. IMPRESSION: No acute intracranial process. Electronically Signed   By: Wiliam Ke M.D.   On: 04/18/2023 17:27    Assessment and Plan Devin White is a 67 y.o. male with medical history significant of liver cirrhosis (s/p of liver transplantation 2009, on immunosuppressant), HTN, HLD, DM not on medications, diastolic CHF, stroke, gout, GERD, BPH, CKD-3B, IBS, who presents with dizziness, blurry vision, left-sided numbness and weakness, SOB, chest pain.   Hyperosmolar hyperglycemic state (HHS) and Type II diabetes mellitus with renal manifestations: blood sugar 676, anion gap 15, bicarbonate 19, pH 7.3 by VBG, negative ketone in UA.  Mental status normal.  Recent A1c 7.0.  -- Patient was on insulin drip. Now transition to subcu long-acting insulin. -- Will continue sliding scale insulin. -- Dietitian input for diabetes diet appreciated. -- consult to diabetic  educator input appreciated. -- Last A1c was 7.0   Left sided numbness and weakness and hx of stroke: pt resports left-sided numbness and worsening weakness in left arm and leg.  CT head negative. -Continue aspirin, Crestor - MRI of brain neg for new stroke -PT/OT--HHPT   Chest pain: Patient reports shortness of breath and chest pain, which is pressure-like, nonpleuritic, low suspicions of PE. -As needed nitroglycerin -Aspirin, Crestor -Trend troponin flat and neg -resolved    Acute renal failure superimposed on stage 3b chronic kidney disease (HCC): Likely due to dehydration and continuation of lisinopril and diuretics. -Hold Lasix and lisinopril -recieved IVF creat 2.15--1.5   History of liver transplant (HCC) -Continue home prednisone 10 mg daily, tacrolimus and CellCept -Carefully use as needed Tylenol at low-dose (patient can only use NSAIDs due to worsening renal function)   Chronic diastolic heart failure (HCC): 2D echo on 11/07/2022 showed EF of 60-85% with grade 1 diastolic dysfunction.  Patient reports shortness breath, but no leg edema or JVD.  BNP 24.1, CHF is compensated. -Hold Lasix due to worsening renal function and HHS   Leukocytosis: WBC 14.1, no fever, no signs of infection identified.  Likely due to steroid use -Follow-up with CBC   BPH (benign prostatic hyperplasia): -Flomax   Hypertension -IV hydralazine as needed -Hold lisinopril due to worsening renal function -Amlodipine   HLD (hyperlipidemia) -Crestor   Obesity (BMI 30-39.9): Body weight 120.2 kg, BMI  34.96 -Exercise in healthy diet -Encourage losing weight    Family communication :none Consults :none CODE STATUS: full DVT Prophylaxis : Level of care: Telemetry Medical Status is: Inpatient Remains inpatient appropriate because: HHS, monitor sugars for one more day    TOTAL TIME TAKING CARE OF THIS PATIENT: 35 minutes.  >50% time spent on counselling and coordination of care  Note: This  dictation was prepared with Dragon dictation along with smaller phrase technology. Any transcriptional errors that result from this process are unintentional.  Enedina Finner M.D    [facility]   CC: Primary care physician; Karie Schwalbe, MD

## 2023-04-19 NOTE — Progress Notes (Signed)
RD consulted for nutrition education regarding diabetes.   Lab Results  Component Value Date   HGBA1C 7.0 (A) 02/14/2023   PTA DM medications are none.   Labs reviewed: CBGS: 185-202 (inpatient orders for glycemic control are 0-15 units insulin aspart TID with meals and at bedtime and 15 units insulin glargine-yfgn daily).    Reviewed I/O's: +256 ml x 24 hours  Pt unavailable at time of visit. Attempted to speak with pt via call to hospital room phone, however, unable to reach. Per DM coordinator notes, pt with recent DM diagnosis. He already has a meter and supplies. He is also interested in CGM. Plan for insulin use at home.   Noted pt with recent steroid use.   RD provided "Carbohydrate Counting for People with Diabetes" and "Plate Method" handouts from the Academy of Nutrition and Dietetics.   RD also referred pt to [facility]'s Nutrition and Diabetes Education Services for further education and reinforcement at discharge.   Body mass index is 34.96 kg/m. Pt meets criteria for obesity, class I based on current BMI. Obesity is a complex, chronic medical condition that is optimally managed by a multidisciplinary care team. Weight loss is not an ideal goal for an acute inpatient hospitalization. However, if further work-up for obesity is warranted, consider outpatient referral to [facility].    Current diet order is heart healthy/ carb modified (liberalized to carb modified) , patient is consuming approximately n/a% of meals at this time. Labs and medications reviewed. No further nutrition interventions warranted at this time. RD contact information provided. If additional nutrition issues arise, please re-consult RD.  Levada Schilling, RD, LDN, CDCES Registered Dietitian III Certified Diabetes Care and Education Specialist Please refer to [facility] for RD and/or RD on-call/weekend/after hours pager

## 2023-04-19 NOTE — Evaluation (Signed)
Physical Therapy Evaluation Patient Details Name: Devin White MRN: [id number] DOB: 01-29-1956 Today's Date: 04/19/2023  History of Present Illness  Pt is a 67 y.o. male with medical history significant of liver cirrhosis (s/p of liver transplantation 2009, on immunosuppressant), HTN, HLD, DM not on medications, diastolic CHF, stroke, gout, GERD, BPH, CKD-3B, IBS, who presents with dizziness, blurry vision, left-sided numbness and weakness, presenting to ED on 04/18/23 for SOB, chest pain.  Clinical Impression  Pt is received in bed, he is agreeable to PT session. At baseline, Pt reports independent with ADL/IADLs, occasional use of rollator when feeling dizzy/weak, and recent increase weakness on L side following mini CVAs from prev months. Pt performs bed mobility, transfers, and standing exercise CGA for safety. Assessed BLE MMT with LLE 3-/5 grossly. Additionally, notable swaying during standing activities that improved with CGA and 1 hand held assist for increase steadiness. Unable to further assess mobility at this time due to restrictions from IV lines. Pt reports noticing recent L knee buckling during amb or stair navigation. Pt would benefit from skilled PT to address above deficits and promote optimal return to PLOF.      If plan is discharge home, recommend the following: A little help with walking and/or transfers;Help with stairs or ramp for entrance   Can travel by private vehicle        Equipment Recommendations None recommended by PT  Recommendations for Other Services       Functional Status Assessment Patient has had a recent decline in their functional status and demonstrates the ability to make significant improvements in function in a reasonable and predictable amount of time.     Precautions / Restrictions Precautions Precautions: None Restrictions Weight Bearing Restrictions: No      Mobility  Bed Mobility Overal bed mobility: Needs Assistance Bed  Mobility: Supine to Sit, Sit to Supine     Supine to sit: Contact guard, HOB elevated, Used rails Sit to supine: Supervision, HOB elevated, Used rails   General bed mobility comments: Pt able to perform bed mobility supA-CGA for safety; occasional verbal cueing to not withhold breath during mobility and use of hand railings    Transfers Overall transfer level: Needs assistance Equipment used: 1 person hand held assist Transfers: Sit to/from Stand Sit to Stand: Contact guard assist           General transfer comment: Pt able to perform STS with 1 HHA CGA for safety and increase steadiness; notable mild swaying and wide base of support    Ambulation/Gait               General Gait Details: Deferred at this time secondary to IV restrictions  Stairs            Wheelchair Mobility     Tilt Bed    Modified Rankin (Stroke Patients Only)       Balance Overall balance assessment: Needs assistance Sitting-balance support: Feet supported, Single extremity supported Sitting balance-Leahy Scale: Good Sitting balance - Comments: Pt able to maintain seated EOB balance with 1 UE support during functional activities   Standing balance support: Single extremity supported, During functional activity Standing balance-Leahy Scale: Fair Standing balance comment: Pt able to maintain static/dynamic standing balance with use of 1 UE support with mild swaying and unsteadiness during functional activities                             Pertinent Vitals/Pain  Pain Assessment Pain Assessment: 0-10 Pain Score: 6  Pain Location: L shoulder from pitched nerve Pain Descriptors / Indicators: Constant, Nagging Pain Intervention(s): Limited activity within patient's tolerance    Home Living Family/patient expects to be discharged to:: Private residence Living Arrangements: Alone Available Help at Discharge: Family;Available PRN/intermittently (family lives in  [place]) Type of Home: Mobile home Home Access: Stairs to enter Entrance Stairs-Rails: Right Entrance Stairs-Number of Steps: 4   Home Layout: One level Home Equipment: Rollator (4 wheels)      Prior Function Prior Level of Function : Independent/Modified Independent;Driving             Mobility Comments: Pt reports intermittent use of rollator depending on dizziness/weakness ADLs Comments: Independent with ADL/IADLs     Extremity/Trunk Assessment   Upper Extremity Assessment Upper Extremity Assessment: Defer to OT evaluation;Generalized weakness    Lower Extremity Assessment Lower Extremity Assessment: LLE deficits/detail LLE Deficits / Details: gross 3-/5 MMT LLE Sensation: decreased light touch LLE Coordination: WNL       Communication   Communication Communication: No apparent difficulties Cueing Techniques: Verbal cues  Cognition Arousal: Alert Behavior During Therapy: WFL for tasks assessed/performed Overall Cognitive Status: Within Functional Limits for tasks assessed                                 General Comments: AO x4; Pleasant and cooperative with PT sessions        General Comments      Exercises Other Exercises Other Exercises: x4 R/L lat steps alongside bed with 1 HHA CGA for safety; unsteadiness throughout with Pt reporting LLE feeling weak   Assessment/Plan    PT Assessment Patient needs continued PT services  PT Problem List Decreased strength;Decreased activity tolerance;Decreased balance;Decreased mobility;Pain       PT Treatment Interventions DME instruction;Neuromuscular re-education;Gait training;Stair training;Functional mobility training;Therapeutic activities;Therapeutic exercise;Balance training    PT Goals (Current goals can be found in the Care Plan section)  Acute Rehab PT Goals Patient Stated Goal: to get L side stronger PT Goal Formulation: With patient Time For Goal Achievement: 05/03/23 Potential  to Achieve Goals: Good    Frequency Min 1X/week     Co-evaluation               AM-PAC PT "6 Clicks" Mobility  Outcome Measure Help needed turning from your back to your side while in a flat bed without using bedrails?: None Help needed moving from lying on your back to sitting on the side of a flat bed without using bedrails?: None Help needed moving to and from a bed to a chair (including a wheelchair)?: A Little Help needed standing up from a chair using your arms (e.g., wheelchair or bedside chair)?: A Little Help needed to walk in hospital room?: A Little Help needed climbing 3-5 steps with a railing? : A Lot 6 Click Score: 19    End of Session   Activity Tolerance: Patient tolerated treatment well Patient left: in bed;with call bell/phone within reach Nurse Communication: Mobility status PT Visit Diagnosis: Unsteadiness on feet (R26.81);Muscle weakness (generalized) (M62.81);Hemiplegia and hemiparesis Hemiplegia - Right/Left: Left Hemiplegia - dominant/non-dominant: Non-dominant Hemiplegia - caused by:  (prior CVA)    Time: 0981-1914 PT Time Calculation (min) (ACUTE ONLY): 14 min   Charges:                 Elmon Else, SPT   Ulus Hazen 04/19/2023, 9:00  AM

## 2023-04-19 NOTE — ED Notes (Signed)
ED TO INPATIENT HANDOFF REPORT  ED Nurse Name and Phone #: Thurmon Fair. Manson Passey [phone]  S Name/Age/Gender Devin White 67 y.o. male Room/Bed: ED33A/ED33A  Code Status   Code Status: Full Code  Home/SNF/Other Home Patient oriented to: self, place, time, and situation Is this baseline? Yes   Triage Complete: Triage complete  Chief Complaint Hyperosmolar hyperglycemic state (HHS) (HCC) [E11.00]  Triage Note PT complains SOB x 4weeks, worse on exertion. 2 days ago started to have of dizziness, blurred vision, nausea, left arm and face numbness. Pt said while eating this morning had difficult time chewing food. Pt states dizziness has been getting worse through out the day. Pt has history of stroke. Pt complains of increased thirst and urination. Not currently on medication of diabetes   Allergies Allergies  Allergen Reactions   Levaquin [Levofloxacin] Shortness Of Breath    SOB and severe leg pain/weakness    Level of Care/Admitting Diagnosis ED Disposition     ED Disposition  Admit   Condition  --   Comment  Hospital Area: [facility] [[id number]]  Level of Care: Telemetry Medical [104]  Covid Evaluation: Asymptomatic - no recent exposure (last 10 days) testing not required  Diagnosis: Hyperosmolar hyperglycemic state (HHS) [facility]) [[id number]]  Admitting Physician: Joselyn Glassman  Attending Physician: Joselyn Glassman  Certification:: I certify this patient will need inpatient services for at least 2 midnights          B Medical/Surgery History Past Medical History:  Diagnosis Date   Arthritis    back and legs   Benign prostatic hypertrophy    CHF (congestive heart failure) (HCC)    GERD (gastroesophageal reflux disease)    Gout    History of blood transfusion    pt has antibodies in his blood since previous transfusions   History of cirrhosis of liver S/P TRANSPLANT 2009   History of liver failure S/P TRANSPLANT   Hypertension     Left ureteral calculus    Renal disorder    Stroke [facility])    Past Surgical History:  Procedure Laterality Date   APPENDECTOMY     bone morrow biopsy     CHOLECYSTECTOMY  2007   CYSTO/ LEFT RETROGRADE PYELOGRAM/ LEFT URETERAL STENT PLACEMENT  03-05-2012  DR Berneice Heinrich [facility])   LEFT URETERAL CALCULI   CYSTOSCOPY W/ URETERAL STENT PLACEMENT  03/11/2012   Procedure: CYSTOSCOPY WITH STENT REPLACEMENT;  Surgeon: Sebastian Ache, MD;  Location: [facility];  Service: Urology;  Laterality: Left;   HERNIA REPAIR  2008   LIVER BIOPSY     LIVER TRANSPLANT  11/24/2007   pt states doing well since liver transplant   LUMBAR DISC SURGERY     LUMBAR FUSION     removal of fistula     URETEROSCOPY  03/11/2012   Procedure: URETEROSCOPY;  Surgeon: Sebastian Ache, MD;  Location: [facility];  Service: Urology;  Laterality: Left;   STONE MANIPULATION, stone obtained  [phone] U[employer] MCR     A IV Location/Drains/Wounds Patient Lines/Drains/Airways Status     Active Line/Drains/Airways     Name Placement date Placement time Site Days   Peripheral IV 04/18/23 20 G Left Antecubital 04/18/23  1519  Antecubital  1   Peripheral IV 04/18/23 20 G Right Antecubital 04/18/23  1544  Antecubital  1            Intake/Output Last 24 hours  Intake/Output Summary (Last 24 hours) at 04/19/2023 1246 Last  data filed at 04/19/2023 0123 Gross per 24 hour  Intake 256.17 ml  Output --  Net 256.17 ml    Labs/Imaging Results for orders placed or performed during the hospital encounter of 04/18/23 (from the past 48 hour(s))  TSH     Status: None   Collection Time: 04/18/23 11:53 AM  Result Value Ref Range   TSH 0.855 0.350 - 4.500 uIU/mL    Comment: Performed by a 3rd Generation assay with a functional sensitivity of <=0.01 uIU/mL. Performed at [facility], [address] [place], [place] [address]   CBG monitoring, ED     Status: Abnormal   Collection Time: 04/18/23  2:53 PM   Result Value Ref Range   Glucose-Capillary >600 (HH) 70 - 99 mg/dL    Comment: Glucose reference range applies only to samples taken after fasting for at least 8 hours.  CBC     Status: Abnormal   Collection Time: 04/18/23  2:57 PM  Result Value Ref Range   WBC 14.1 (H) 4.0 - 10.5 K/uL   RBC 4.36 4.22 - 5.81 MIL/uL   Hemoglobin 14.1 13.0 - 17.0 g/dL   HCT 60.4 54.0 - 98.1 %   MCV 94.3 80.0 - 100.0 fL   MCH 32.3 26.0 - 34.0 pg   MCHC 34.3 30.0 - 36.0 g/dL   RDW 19.1 47.8 - 29.5 %   Platelets 190 150 - 400 K/uL   nRBC 0.1 0.0 - 0.2 %    Comment: Performed at [facility], [address] [place], [place] [address]  Urinalysis, Routine w reflex microscopic -Urine, Clean Catch     Status: Abnormal   Collection Time: 04/18/23  2:57 PM  Result Value Ref Range   Color, Urine YELLOW (A) YELLOW   APPearance CLEAR (A) CLEAR   Specific Gravity, Urine 1.025 1.005 - 1.030   pH 5.0 5.0 - 8.0   Glucose, UA >=500 (A) NEGATIVE mg/dL   Hgb urine dipstick NEGATIVE NEGATIVE   Bilirubin Urine NEGATIVE NEGATIVE   Ketones, ur NEGATIVE NEGATIVE mg/dL   Protein, ur NEGATIVE NEGATIVE mg/dL   Nitrite NEGATIVE NEGATIVE   Leukocytes,Ua NEGATIVE NEGATIVE   RBC / HPF 0 0 - 5 RBC/hpf   WBC, UA 0-5 0 - 5 WBC/hpf   Bacteria, UA NONE SEEN NONE SEEN   Squamous Epithelial / HPF 0-5 0 - 5 /HPF    Comment: Performed at [facility], [address] [place], [place] [address]  Comprehensive metabolic panel     Status: Abnormal   Collection Time: 04/18/23  2:57 PM  Result Value Ref Range   Sodium 131 (L) 135 - 145 mmol/L   Potassium 4.3 3.5 - 5.1 mmol/L   Chloride 97 (L) 98 - 111 mmol/L   CO2 19 (L) 22 - 32 mmol/L   Glucose, Bld 676 (HH) 70 - 99 mg/dL    Comment: CRITICAL RESULT CALLED TO, READ BACK BY AND VERIFIED WITH LINDSEY BLACK @1546  04/18/23 MJU Glucose reference range applies only to samples taken after fasting for at least 8 hours.    BUN 24 (H) 8 - 23 mg/dL   Creatinine, Ser  4.69 (H) 0.61 - 1.24 mg/dL   Calcium 8.9 8.9 - 62.9 mg/dL   Total Protein 6.6 6.5 - 8.1 g/dL   Albumin 3.9 3.5 - 5.0 g/dL   AST 50 (H) 15 - 41 U/L   ALT 60 (H) 0 - 44 U/L   Alkaline Phosphatase 62 38 - 126 U/L  Total Bilirubin 2.8 (H) 0.3 - 1.2 mg/dL   GFR, Estimated 33 (L) >60 mL/min    Comment: (NOTE) Calculated using the CKD-EPI Creatinine Equation (2021)    Anion gap 15 5 - 15    Comment: Performed at [facility], [address] [place], [place] [address]  Brain natriuretic peptide     Status: None   Collection Time: 04/18/23  2:57 PM  Result Value Ref Range   B Natriuretic Peptide 24.1 0.0 - 100.0 pg/mL    Comment: Performed at [facility], [address] [place], [place] [address]  CBG monitoring, ED     Status: Abnormal   Collection Time: 04/18/23  3:58 PM  Result Value Ref Range   Glucose-Capillary 599 (HH) 70 - 99 mg/dL    Comment: Glucose reference range applies only to samples taken after fasting for at least 8 hours.   Comment 1 Notify RN   Osmolality     Status: Abnormal   Collection Time: 04/18/23  4:07 PM  Result Value Ref Range   Osmolality 316 (H) 275 - 295 mOsm/kg    Comment: REPEATED TO VERIFY Performed at [facility], [address] [place], [place] [address]   CBG monitoring, ED     Status: Abnormal   Collection Time: 04/18/23  4:32 PM  Result Value Ref Range   Glucose-Capillary 574 (HH) 70 - 99 mg/dL    Comment: Glucose reference range applies only to samples taken after fasting for at least 8 hours.   Comment 1 Notify RN   CBG monitoring, ED     Status: Abnormal   Collection Time: 04/18/23  5:04 PM  Result Value Ref Range   Glucose-Capillary 428 (H) 70 - 99 mg/dL    Comment: Glucose reference range applies only to samples taken after fasting for at least 8 hours.  CBG monitoring, ED     Status: Abnormal   Collection Time: 04/18/23  5:39 PM  Result Value Ref Range   Glucose-Capillary 372 (H) 70 - 99 mg/dL     Comment: Glucose reference range applies only to samples taken after fasting for at least 8 hours.  Blood gas, venous     Status: Abnormal   Collection Time: 04/18/23  6:00 PM  Result Value Ref Range   pH, Ven 7.3 7.25 - 7.43   pCO2, Ven 47 44 - 60 mmHg   pO2, Ven 33 32 - 45 mmHg   Bicarbonate 23.1 20.0 - 28.0 mmol/L   Acid-base deficit 3.6 (H) 0.0 - 2.0 mmol/L   O2 Saturation 55.4 %   Patient temperature 37.0    Collection site VEIN     Comment: Performed at [facility], [address] [place], [place] [address]  CBG monitoring, ED     Status: Abnormal   Collection Time: 04/18/23  6:42 PM  Result Value Ref Range   Glucose-Capillary 272 (H) 70 - 99 mg/dL    Comment: Glucose reference range applies only to samples taken after fasting for at least 8 hours.  CBG monitoring, ED     Status: Abnormal   Collection Time: 04/18/23  7:38 PM  Result Value Ref Range   Glucose-Capillary 247 (H) 70 - 99 mg/dL    Comment: Glucose reference range applies only to samples taken after fasting for at least 8 hours.  CBG monitoring, ED     Status: Abnormal   Collection Time: 04/18/23  8:11 PM  Result Value Ref Range   Glucose-Capillary  228 (H) 70 - 99 mg/dL    Comment: Glucose reference range applies only to samples taken after fasting for at least 8 hours.  Basic metabolic panel     Status: Abnormal   Collection Time: 04/18/23  8:16 PM  Result Value Ref Range   Sodium 136 135 - 145 mmol/L   Potassium 4.2 3.5 - 5.1 mmol/L   Chloride 106 98 - 111 mmol/L   CO2 23 22 - 32 mmol/L   Glucose, Bld 284 (H) 70 - 99 mg/dL    Comment: Glucose reference range applies only to samples taken after fasting for at least 8 hours.   BUN 23 8 - 23 mg/dL   Creatinine, Ser 1.61 (H) 0.61 - 1.24 mg/dL   Calcium 8.4 (L) 8.9 - 10.3 mg/dL   GFR, Estimated 40 (L) >60 mL/min    Comment: (NOTE) Calculated using the CKD-EPI Creatinine Equation (2021)    Anion gap 7 5 - 15    Comment: Performed at [facility] [address] [place], [place] [address]  CBG monitoring, ED     Status: Abnormal   Collection Time: 04/18/23  9:22 PM  Result Value Ref Range   Glucose-Capillary 250 (H) 70 - 99 mg/dL    Comment: Glucose reference range applies only to samples taken after fasting for at least 8 hours.  CBG monitoring, ED     Status: Abnormal   Collection Time: 04/18/23 10:30 PM  Result Value Ref Range   Glucose-Capillary 220 (H) 70 - 99 mg/dL    Comment: Glucose reference range applies only to samples taken after fasting for at least 8 hours.  CBG monitoring, ED     Status: Abnormal   Collection Time: 04/18/23 11:23 PM  Result Value Ref Range   Glucose-Capillary 213 (H) 70 - 99 mg/dL    Comment: Glucose reference range applies only to samples taken after fasting for at least 8 hours.  Basic metabolic panel     Status: Abnormal   Collection Time: 04/18/23 11:25 PM  Result Value Ref Range   Sodium 136 135 - 145 mmol/L   Potassium 3.9 3.5 - 5.1 mmol/L   Chloride 106 98 - 111 mmol/L   CO2 23 22 - 32 mmol/L   Glucose, Bld 235 (H) 70 - 99 mg/dL    Comment: Glucose reference range applies only to samples taken after fasting for at least 8 hours.   BUN 20 8 - 23 mg/dL   Creatinine, Ser 5.40 (H) 0.61 - 1.24 mg/dL   Calcium 8.3 (L) 8.9 - 10.3 mg/dL   GFR, Estimated 42 (L) >60 mL/min    Comment: (NOTE) Calculated using the CKD-EPI Creatinine Equation (2021)    Anion gap 7 5 - 15    Comment: Performed at [facility] [place], [address] [place], [place] [address]  CBG monitoring, ED     Status: Abnormal   Collection Time: 04/19/23 12:26 AM  Result Value Ref Range   Glucose-Capillary 188 (H) 70 - 99 mg/dL    Comment: Glucose reference range applies only to samples taken after fasting for at least 8 hours.  CBG monitoring, ED     Status: Abnormal   Collection Time: 04/19/23  1:14 AM  Result Value Ref Range   Glucose-Capillary 187 (H) 70 - 99 mg/dL    Comment: Glucose reference  range applies only to samples taken after fasting for at least 8 hours.  Basic metabolic panel     Status: Abnormal  Collection Time: 04/19/23  2:12 AM  Result Value Ref Range   Sodium 139 135 - 145 mmol/L   Potassium 4.0 3.5 - 5.1 mmol/L   Chloride 108 98 - 111 mmol/L   CO2 24 22 - 32 mmol/L   Glucose, Bld 186 (H) 70 - 99 mg/dL    Comment: Glucose reference range applies only to samples taken after fasting for at least 8 hours.   BUN 20 8 - 23 mg/dL   Creatinine, Ser 1.61 (H) 0.61 - 1.24 mg/dL   Calcium 8.5 (L) 8.9 - 10.3 mg/dL   GFR, Estimated 45 (L) >60 mL/min    Comment: (NOTE) Calculated using the CKD-EPI Creatinine Equation (2021)    Anion gap 7 5 - 15    Comment: Performed at [facility], [address] [place], [place] [address]  Troponin I (High Sensitivity)     Status: None   Collection Time: 04/19/23  2:12 AM  Result Value Ref Range   Troponin I (High Sensitivity) 17 <18 ng/L    Comment: (NOTE) Elevated high sensitivity troponin I (hsTnI) values and significant  changes across serial measurements may suggest ACS but many other  chronic and acute conditions are known to elevate hsTnI results.  Refer to the "Links" section for chest pain algorithms and additional  guidance. Performed at [facility], [address] [place], [place] [address]   Lipid panel     Status: Abnormal   Collection Time: 04/19/23  2:12 AM  Result Value Ref Range   Cholesterol 121 0 - 200 mg/dL   Triglycerides 119 (H) <150 mg/dL   HDL 37 (L) >14 mg/dL   Total CHOL/HDL Ratio 3.3 RATIO   VLDL UNABLE TO CALCULATE IF TRIGLYCERIDE OVER 400 mg/dL 0 - 40 mg/dL   LDL Cholesterol UNABLE TO CALCULATE IF TRIGLYCERIDE OVER 400 mg/dL 0 - 99 mg/dL    Comment:        Total Cholesterol/HDL:CHD Risk Coronary Heart Disease Risk Table                     Men   Women  1/2 Average Risk   3.4   3.3  Average Risk       5.0   4.4  2 X Average Risk   9.6   7.1  3 X Average Risk  23.4    11.0        Use the calculated Patient Ratio above and the CHD Risk Table to determine the patient's CHD Risk.        ATP III CLASSIFICATION (LDL):  <100     mg/dL   Optimal  782-956  mg/dL   Near or Above                    Optimal  130-159  mg/dL   Borderline  213-086  mg/dL   High  >578     mg/dL   Very High Performed at [facility], [address] [place], [place] [address]   CBG monitoring, ED     Status: Abnormal   Collection Time: 04/19/23  2:12 AM  Result Value Ref Range   Glucose-Capillary 173 (H) 70 - 99 mg/dL    Comment: Glucose reference range applies only to samples taken after fasting for at least 8 hours.  SARS Coronavirus 2 by RT PCR (hospital order, performed in [facility] hospital lab) *cepheid single result test* Anterior Nasal Swab     Status:  None   Collection Time: 04/19/23  2:13 AM   Specimen: Anterior Nasal Swab  Result Value Ref Range   SARS Coronavirus 2 by RT PCR NEGATIVE NEGATIVE    Comment: (NOTE) SARS-CoV-2 target nucleic acids are NOT DETECTED.  The SARS-CoV-2 RNA is generally detectable in upper and lower respiratory specimens during the acute phase of infection. The lowest concentration of SARS-CoV-2 viral copies this assay can detect is 250 copies / mL. A negative result does not preclude SARS-CoV-2 infection and should not be used as the sole basis for treatment or other patient management decisions.  A negative result may occur with improper specimen collection / handling, submission of specimen other than nasopharyngeal swab, presence of viral mutation(s) within the areas targeted by this assay, and inadequate number of viral copies (<250 copies / mL). A negative result must be combined with clinical observations, patient history, and epidemiological information.  Fact Sheet for Patients:   RoadLapTop.co.za  Fact Sheet for Healthcare Providers: http://kim-miller.com/  This test  is not yet approved or  cleared by the [place] FDA and has been authorized for detection and/or diagnosis of SARS-CoV-2 by FDA under an Emergency Use Authorization (EUA).  This EUA will remain in effect (meaning this test can be used) for the duration of the COVID-19 declaration under Section 564(b)(1) of the Act, 21 U.S.C. section 360bbb-3(b)(1), unless the authorization is terminated or revoked sooner.  Performed at [facility], [address] [place], [place] [address]   CBG monitoring, ED     Status: Abnormal   Collection Time: 04/19/23  3:20 AM  Result Value Ref Range   Glucose-Capillary 167 (H) 70 - 99 mg/dL    Comment: Glucose reference range applies only to samples taken after fasting for at least 8 hours.  Basic metabolic panel     Status: Abnormal   Collection Time: 04/19/23  3:26 AM  Result Value Ref Range   Sodium 139 135 - 145 mmol/L   Potassium 3.8 3.5 - 5.1 mmol/L   Chloride 108 98 - 111 mmol/L   CO2 24 22 - 32 mmol/L   Glucose, Bld 207 (H) 70 - 99 mg/dL    Comment: Glucose reference range applies only to samples taken after fasting for at least 8 hours.   BUN 21 8 - 23 mg/dL   Creatinine, Ser 4.09 (H) 0.61 - 1.24 mg/dL   Calcium 8.5 (L) 8.9 - 10.3 mg/dL   GFR, Estimated 44 (L) >60 mL/min    Comment: (NOTE) Calculated using the CKD-EPI Creatinine Equation (2021)    Anion gap 7 5 - 15    Comment: Performed at [facility], [address] [place], [place] [address]  Troponin I (High Sensitivity)     Status: None   Collection Time: 04/19/23  3:26 AM  Result Value Ref Range   Troponin I (High Sensitivity) 16 <18 ng/L    Comment: (NOTE) Elevated high sensitivity troponin I (hsTnI) values and significant  changes across serial measurements may suggest ACS but many other  chronic and acute conditions are known to elevate hsTnI results.  Refer to the "Links" section for chest pain algorithms and additional  guidance. Performed at  [facility], [address] [place], [place] [address]   CBG monitoring, ED     Status: Abnormal   Collection Time: 04/19/23  4:19 AM  Result Value Ref Range   Glucose-Capillary 177 (H) 70 - 99 mg/dL    Comment: Glucose reference range applies  only to samples taken after fasting for at least 8 hours.  CBG monitoring, ED     Status: Abnormal   Collection Time: 04/19/23  6:18 AM  Result Value Ref Range   Glucose-Capillary 189 (H) 70 - 99 mg/dL    Comment: Glucose reference range applies only to samples taken after fasting for at least 8 hours.  Basic metabolic panel     Status: Abnormal   Collection Time: 04/19/23  6:20 AM  Result Value Ref Range   Sodium 138 135 - 145 mmol/L   Potassium 4.1 3.5 - 5.1 mmol/L   Chloride 108 98 - 111 mmol/L   CO2 24 22 - 32 mmol/L   Glucose, Bld 218 (H) 70 - 99 mg/dL    Comment: Glucose reference range applies only to samples taken after fasting for at least 8 hours.   BUN 20 8 - 23 mg/dL   Creatinine, Ser 1.61 (H) 0.61 - 1.24 mg/dL   Calcium 8.5 (L) 8.9 - 10.3 mg/dL   GFR, Estimated 50 (L) >60 mL/min    Comment: (NOTE) Calculated using the CKD-EPI Creatinine Equation (2021)    Anion gap 6 5 - 15    Comment: Performed at [facility], [address] [place], [place] [address]  CBG monitoring, ED     Status: Abnormal   Collection Time: 04/19/23  7:48 AM  Result Value Ref Range   Glucose-Capillary 192 (H) 70 - 99 mg/dL    Comment: Glucose reference range applies only to samples taken after fasting for at least 8 hours.  Basic metabolic panel     Status: Abnormal   Collection Time: 04/19/23  7:53 AM  Result Value Ref Range   Sodium 139 135 - 145 mmol/L   Potassium 4.2 3.5 - 5.1 mmol/L   Chloride 107 98 - 111 mmol/L   CO2 24 22 - 32 mmol/L   Glucose, Bld 230 (H) 70 - 99 mg/dL    Comment: Glucose reference range applies only to samples taken after fasting for at least 8 hours.   BUN 20 8 - 23 mg/dL   Creatinine, Ser 5.40  (H) 0.61 - 1.24 mg/dL   Calcium 8.6 (L) 8.9 - 10.3 mg/dL   GFR, Estimated 48 (L) >60 mL/min    Comment: (NOTE) Calculated using the CKD-EPI Creatinine Equation (2021)    Anion gap 8 5 - 15    Comment: Performed at [facility], [address] [place], [place] [address]  CBG monitoring, ED     Status: Abnormal   Collection Time: 04/19/23  9:10 AM  Result Value Ref Range   Glucose-Capillary 185 (H) 70 - 99 mg/dL    Comment: Glucose reference range applies only to samples taken after fasting for at least 8 hours.  CBG monitoring, ED     Status: Abnormal   Collection Time: 04/19/23 11:16 AM  Result Value Ref Range   Glucose-Capillary 202 (H) 70 - 99 mg/dL    Comment: Glucose reference range applies only to samples taken after fasting for at least 8 hours.  Troponin I (High Sensitivity)     Status: None   Collection Time: 04/19/23 11:21 AM  Result Value Ref Range   Troponin I (High Sensitivity) 14 <18 ng/L    Comment: (NOTE) Elevated high sensitivity troponin I (hsTnI) values and significant  changes across serial measurements may suggest ACS but many other  chronic and acute conditions are known to elevate hsTnI results.  Refer to the "Links"  section for chest pain algorithms and additional  guidance. Performed at [facility], [address] [place], [place] [address]    MR BRAIN WO CONTRAST  Result Date: 04/19/2023 CLINICAL DATA:  Initial evaluation for neuro deficit, stroke suspected. EXAM: MRI HEAD WITHOUT CONTRAST TECHNIQUE: Multiplanar, multiecho pulse sequences of the brain and surrounding structures were obtained without intravenous contrast. COMPARISON:  Prior CT from 04/18/2023. FINDINGS: Brain: Cerebral volume within normal limits for age. Patchy and confluent T2/FLAIR hyperintensity involving the periventricular deep white matter both cerebral hemispheres, consistent with chronic small vessel ischemic disease, moderately advanced in nature. No abnormal  foci of restricted diffusion to suggest acute or subacute ischemia. Gray-white matter differentiation maintained. No areas of chronic cortical infarction. No acute intracranial hemorrhage. Single punctate chronic microhemorrhage noted at the left occipital lobe, of doubtful significance in isolation. No mass lesion, midline shift or mass effect. No hydrocephalus or extra-axial fluid collection. Pituitary gland and suprasellar region within normal limits. Vascular: Major intracranial vascular flow voids are maintained. Skull and upper cervical spine: Cranial junction within normal limits. Bone marrow signal intensity normal. No scalp soft tissue abnormality. Sinuses/Orbits: Globes and orbital soft tissues within normal limits. Mild scattered mucosal thickening noted about the paranasal sinuses. Superimposed left maxillary sinus retention cyst. No significant mastoid effusion. Other: None. IMPRESSION: 1. No acute intracranial abnormality. 2. Moderately advanced chronic microvascular ischemic disease for age. Electronically Signed   By: Rise Mu M.D.   On: 04/19/2023 02:14   DG Chest Port 1 View  Result Date: 04/19/2023 CLINICAL DATA:  Chest pain EXAM: PORTABLE CHEST 1 VIEW COMPARISON:  11/15/2022 FINDINGS: The heart size and mediastinal contours are within normal limits. Both lungs are clear. The visualized skeletal structures are unremarkable. IMPRESSION: No active disease. Electronically Signed   By: Charlett Nose M.D.   On: 04/19/2023 01:02   US ABDOMEN LIMITED RUQ (LIVER/GB)  Result Date: 04/18/2023 CLINICAL DATA:  Transaminitis EXAM: ULTRASOUND ABDOMEN LIMITED RIGHT UPPER QUADRANT COMPARISON:  None Available. FINDINGS: Gallbladder: Prior cholecystectomy. Common bile duct: Diameter: 7 mm Liver: No focal lesion identified. Increased parenchymal echogenicity. Portal vein is patent on color Doppler imaging with normal direction of blood flow towards the liver. Other: None. IMPRESSION: 1.  Hepatic  steatosis. 2. Mildly dilated common bile duct, within normal limits status post cholecystectomy. Electronically Signed   By: Allegra Lai M.D.   On: 04/18/2023 20:21   CT Head Wo Contrast  Result Date: 04/18/2023 CLINICAL DATA:  Dizziness, blurred vision, stroke suspected EXAM: CT HEAD WITHOUT CONTRAST TECHNIQUE: Contiguous axial images were obtained from the base of the skull through the vertex without intravenous contrast. RADIATION DOSE REDUCTION: This exam was performed according to the departmental dose-optimization program which includes automated exposure control, adjustment of the mA and/or kV according to patient size and/or use of iterative reconstruction technique. COMPARISON:  11/07/2022 CT head FINDINGS: Brain: No evidence of acute infarction, hemorrhage, mass, mass effect, or midline shift. No hydrocephalus or extra-axial fluid collection. Periventricular white matter changes, likely the sequela of chronic small vessel ischemic disease. Vascular: No hyperdense vessel. Skull: Negative for fracture or focal lesion. Sinuses/Orbits: No acute finding. Other: The mastoid air cells are well aerated. IMPRESSION: No acute intracranial process. Electronically Signed   By: Wiliam Ke M.D.   On: 04/18/2023 17:27    Pending Labs Unresulted Labs (From admission, onward)     Start     Ordered   04/19/23 0212  LDL cholesterol, direct  Once,   AD  04/19/23 0212   04/19/23 0037  Hemoglobin A1c  Add-on,   AD        04/19/23 0036   04/18/23 2026  Protime-INR  Once,   AD        04/18/23 2026   04/18/23 2026  APTT  Once,   AD        04/18/23 2026            Vitals/Pain Today's Vitals   04/19/23 0900 04/19/23 0912 04/19/23 1051 04/19/23 1137  BP: 125/89  (!) 127/90 (!) 147/96  Pulse: 71   64  Resp: 18   19  Temp:    97.6 F (36.4 C)  TempSrc:    Oral  SpO2: 95%   97%  Weight:      Height:      PainSc:  7       Isolation Precautions No active  isolations  Medications Medications  ondansetron (ZOFRAN) injection 4 mg (has no administration in time range)  hydrALAZINE (APRESOLINE) injection 5 mg (has no administration in time range)  acetaminophen (TYLENOL) tablet 325 mg (325 mg Oral Given 04/19/23 0915)  insulin regular, human (MYXREDLIN) 100 units/ 100 mL infusion (0 Units/hr Intravenous Stopped 04/19/23 0911)  dextrose 50 % solution 0-50 mL (has no administration in time range)  enoxaparin (LOVENOX) injection 60 mg (60 mg Subcutaneous Given 04/18/23 2124)  nitroGLYCERIN (NITROSTAT) SL tablet 0.4 mg (has no administration in time range)  aspirin EC tablet 81 mg (81 mg Oral Given 04/19/23 1054)  amLODipine (NORVASC) tablet 10 mg (10 mg Oral Given 04/19/23 1051)  rosuvastatin (CRESTOR) tablet 20 mg (20 mg Oral Given 04/19/23 1050)  predniSONE (DELTASONE) tablet 10 mg (10 mg Oral Given 04/19/23 1051)  pantoprazole (PROTONIX) EC tablet 40 mg (40 mg Oral Given 04/19/23 1051)  tamsulosin (FLOMAX) capsule 0.4 mg (has no administration in time range)  mycophenolate (CELLCEPT) capsule 500 mg (500 mg Oral Given 04/19/23 1052)    And  mycophenolate (CELLCEPT) capsule 250 mg (250 mg Oral Given 04/19/23 0323)  tacrolimus (PROGRAF) capsule 0.5 mg (0.5 mg Oral Given 04/19/23 1050)    And  tacrolimus (PROGRAF) capsule 1 mg (1 mg Oral Given 04/19/23 0324)  insulin aspart (novoLOG) injection 0-15 Units (5 Units Subcutaneous Given 04/19/23 1129)  insulin starter kit- pen needles (English) 1 kit (has no administration in time range)  insulin glargine-yfgn (SEMGLEE) injection 15 Units (has no administration in time range)  trimethoprim (TRIMPEX) tablet 100 mg (has no administration in time range)  nortriptyline (PAMELOR) capsule 10 mg (has no administration in time range)  multivitamin with minerals tablet 1 tablet (has no administration in time range)  gabapentin (NEURONTIN) capsule 300 mg (has no administration in time range)  calcium carbonate (TUMS - dosed  in mg elemental calcium) chewable tablet 200 mg of elemental calcium (has no administration in time range)  sodium chloride 0.9 % bolus 1,000 mL (0 mLs Intravenous Stopped 04/18/23 1833)  ondansetron (ZOFRAN) injection 4 mg (4 mg Intravenous Given 04/18/23 1520)  lactated ringers bolus 2,404 mL (0 mLs Intravenous Stopped 04/18/23 1843)  potassium chloride 10 mEq in 100 mL IVPB (0 mEq Intravenous Stopped 04/18/23 2228)  hydrocortisone sodium succinate (SOLU-CORTEF) 100 MG injection 50 mg (50 mg Intravenous Given 04/19/23 0112)  living well with diabetes book MISC ( Does not apply Given 04/19/23 1054)    Mobility walks     Focused Assessments Cardiac Assessment Handoff:  Cardiac Rhythm: Normal sinus rhythm Lab Results  Component Value  Date   CKTOTAL 66 09/26/2010   CKMB 2.1 09/26/2010   TROPONINI < 0.02 11/30/2013   Lab Results  Component Value Date   DDIMER 0.46 11/16/2022   Does the Patient currently have chest pain? No    R Recommendations: See Admitting Provider Note  Report given to:   Additional Notes:

## 2023-04-19 NOTE — ED Notes (Signed)
Provider (NP Jon Billings) notified of endotool recommendations to d/c insulin drip

## 2023-04-19 NOTE — Inpatient Diabetes Management (Addendum)
Inpatient Diabetes Program Recommendations  AACE/ADA: New Consensus Statement on Inpatient Glycemic Control (2015)  Target Ranges:  Prepandial:   less than 140 mg/dL      Peak postprandial:   less than 180 mg/dL (1-2 hours)      Critically ill patients:  140 - 180 mg/dL   Lab Results  Component Value Date   GLUCAP 192 (H) 04/19/2023   HGBA1C 7.0 (A) 02/14/2023    Review of Glycemic Control  Latest Reference Range & Units 04/19/23 04:19 04/19/23 06:18 04/19/23 07:48  Glucose-Capillary 70 - 99 mg/dL 409 (H) 811 (H) 914 (H)  (H): Data is abnormally high Diabetes history: Type 2 DM Outpatient Diabetes medications: none Current orders for Inpatient glycemic control: IV insulin to transition to Semglee 10 units every day, Novolog 0-15 units TID  Inpatient Diabetes Program Recommendations:    Noted consult.   Will order LWWDM, insulin starter kit and dietitian consult.   Of note: Patient was recently diagnosed with new onset diabetes on 02/14/2023. Received epidural injection with steroids on 02/03/2023, hence maybe contributing to hyperglycemia.  Will plan to speak with patient.    Addendum: Spoke with patient regarding outpatient diabetes management. Patient is recently diagnosed and has not been on antidiabetic agents.  Reviewed patient's current A1c of 7%. Explained what a A1c is and what it measures. Also reviewed goal A1c with patient, importance of good glucose control @ home, and blood sugar goals. Reviewed patho of DM, role of pancreas, need for insulin, current insulin needs, survival skills, interventions, vascular changes and commorbidities.  Patient has a meter and supplies. Encouraged to bring meter to hospital to ensure its functionality. Additionally, encouraged patient to follow up with Dr Alphonsus Sias, PCP. Discussed CGMs as another option for monitoring. Patient interested. Educated patient on insulin pen use at home. Attached videos and sent patient links to cell phone to  view. Encouraged to perform self injections prior to DC. MD to give patient Rxs for insulin pens and insulin pen needles. Patient admits to drinking sugary beverages. Reviewed alternatives and encouraged CHO mindfulness. LWWDM booklet ordered. Ordered dietitian consult and insulin starter kit.   DM coordinator working remotely to cover all campus for weekend call.   Thanks, Lujean Rave, MSN, RNC-OB Diabetes Coordinator [phone] (8a-5p)

## 2023-04-20 DIAGNOSIS — E1100 Type 2 diabetes mellitus with hyperosmolarity without nonketotic hyperglycemic-hyperosmolar coma (NKHHC): Secondary | ICD-10-CM | POA: Diagnosis not present

## 2023-04-20 LAB — GLUCOSE, CAPILLARY
Glucose-Capillary: 220 mg/dL — ABNORMAL HIGH (ref 70–99)
Glucose-Capillary: 353 mg/dL — ABNORMAL HIGH (ref 70–99)
Glucose-Capillary: 350 mg/dL — ABNORMAL HIGH (ref 70–99)
Glucose-Capillary: 253 mg/dL — ABNORMAL HIGH (ref 70–99)

## 2023-04-20 MED ORDER — GLUCERNA SHAKE PO LIQD
237.0000 mL | Freq: Two times a day (BID) | ORAL | Status: DC
  Administered 2023-04-20 (×2): 237 mL via ORAL

## 2023-04-20 MED ORDER — INSULIN GLARGINE-YFGN 100 UNIT/ML ~~LOC~~ SOLN
25.0000 [IU] | Freq: Every day | SUBCUTANEOUS | Status: DC
  Administered 2023-04-20 – 2023-04-21 (×2): 25 [IU] via SUBCUTANEOUS
  Filled 2023-04-20 (×2): qty 0.25

## 2023-04-20 MED ORDER — MELATONIN 5 MG PO TABS
5.0000 mg | ORAL_TABLET | Freq: Once | ORAL | Status: AC
  Administered 2023-04-20: 5 mg via ORAL
  Filled 2023-04-20: qty 1

## 2023-04-20 MED ORDER — GLIPIZIDE ER 10 MG PO TB24: 10.0000 mg | ORAL_TABLET | Freq: Every day | ORAL | Status: DC

## 2023-04-20 MED ORDER — GLIPIZIDE ER 5 MG PO TB24
5.0000 mg | ORAL_TABLET | Freq: Every day | ORAL | Status: DC
  Administered 2023-04-20: 5 mg via ORAL
  Filled 2023-04-20 (×2): qty 1

## 2023-04-20 MED ORDER — INSULIN ASPART 100 UNIT/ML IJ SOLN
10.0000 [IU] | Freq: Three times a day (TID) | INTRAMUSCULAR | Status: DC
  Administered 2023-04-21 (×3): 10 [IU] via SUBCUTANEOUS
  Filled 2023-04-20 (×2): qty 1

## 2023-04-20 NOTE — Plan of Care (Signed)
  Problem: Skin Integrity: Goal: Risk for impaired skin integrity will decrease 04/20/2023 1941 by Ciria Bernardini Bet, LPN Outcome: Progressing   Problem: Safety: Goal: Ability to remain free from injury will improve 04/20/2023 1941 by Daylon Lafavor Bet, LPN Outcome: Progressing   Problem: Pain Management: Goal: General experience of comfort will improve 04/20/2023 1940 by Nashay Brickley Bet, LPN Outcome: Progressing   Problem: Coping: Goal: Level of anxiety will decrease 04/20/2023 1940 by Lester Platas Bet, LPN Outcome: Progressing   Problem: Elimination: Goal: Will not experience complications related to bowel motility 04/20/2023 1940 by Addelyn Alleman Bet, LPN Outcome: Progressing   Problem: Activity: Goal: Risk for activity intolerance will decrease Outcome: Progressing

## 2023-04-20 NOTE — Plan of Care (Signed)
  Problem: Coping: Goal: Ability to adjust to condition or change in health will improve Outcome: Progressing   

## 2023-04-20 NOTE — Progress Notes (Signed)
  Chaplain On-Call responded to Spiritual Care Consult Order from Enedina Finner, MD. The request was for prayer with the patient.  Chaplain met the patient and provided compassionate listening as he described his current medical condition, and as he spoke at length about receiving a successful liver transplant fifteen years ago.  The patient stated that he has an active faith and is supported by the [employer] and members of his congregation.  Chaplain provided spiritual and emotional support and prayer.  Chaplain Evelena Peat M.Div., [facility]

## 2023-04-20 NOTE — Progress Notes (Signed)
[facility]  - [place] [facility]   PATIENT NAME: Devin White    MR#:  [id number]  DATE OF BIRTH:  August 11, 1955  SUBJECTIVE:  patient complains of nausea chest discomfort dizziness left rib cage pain. He did tolerated his serial breakfast and ate a whole cup of yogurt. No vomiting. Sugars still bit on the higher side. Wants to stay one more day.    VITALS:  Blood pressure 128/79, pulse (!) 57, temperature (!) 97.4 F (36.3 C), temperature source Oral, resp. rate 16, height 6\' 1"  (1.854 m), weight 120.2 kg, SpO2 93%.  PHYSICAL EXAMINATION:   GENERAL:  67 y.o.-year-old patient with no acute distress. Morbid obesity LUNGS: Normal breath sounds bilaterally, no wheezing CARDIOVASCULAR: S1, S2 normal. No murmur   ABDOMEN: Soft, nontender, nondistended. Bowel sounds present.  EXTREMITIES: No  edema b/l.    NEUROLOGIC: nonfocal  patient is alert and awake SKIN: No obvious rash, lesion, or ulcer.   LABORATORY PANEL:  CBC Recent Labs  Lab 04/18/23 1457  WBC 14.1*  HGB 14.1  HCT 41.1  PLT 190    Chemistries  Recent Labs  Lab 04/18/23 1457 04/18/23 2016 04/19/23 0753  NA 131*   < > 139  K 4.3   < > 4.2  CL 97*   < > 107  CO2 19*   < > 24  GLUCOSE 676*   < > 230*  BUN 24*   < > 20  CREATININE 2.18*   < > 1.60*  CALCIUM 8.9   < > 8.6*  AST 50*  --   --   ALT 60*  --   --   ALKPHOS 62  --   --   BILITOT 2.8*  --   --    < > = values in this interval not displayed.   Cardiac Enzymes No results for input(s): "TROPONINI" in the last 168 hours. RADIOLOGY:  MR BRAIN WO CONTRAST  Result Date: 04/19/2023 CLINICAL DATA:  Initial evaluation for neuro deficit, stroke suspected. EXAM: MRI HEAD WITHOUT CONTRAST TECHNIQUE: Multiplanar, multiecho pulse sequences of the brain and surrounding structures were obtained without intravenous contrast. COMPARISON:  Prior CT from 04/18/2023. FINDINGS: Brain: Cerebral volume within normal limits for age. Patchy and  confluent T2/FLAIR hyperintensity involving the periventricular deep white matter both cerebral hemispheres, consistent with chronic small vessel ischemic disease, moderately advanced in nature. No abnormal foci of restricted diffusion to suggest acute or subacute ischemia. Gray-white matter differentiation maintained. No areas of chronic cortical infarction. No acute intracranial hemorrhage. Single punctate chronic microhemorrhage noted at the left occipital lobe, of doubtful significance in isolation. No mass lesion, midline shift or mass effect. No hydrocephalus or extra-axial fluid collection. Pituitary gland and suprasellar region within normal limits. Vascular: Major intracranial vascular flow voids are maintained. Skull and upper cervical spine: Cranial junction within normal limits. Bone marrow signal intensity normal. No scalp soft tissue abnormality. Sinuses/Orbits: Globes and orbital soft tissues within normal limits. Mild scattered mucosal thickening noted about the paranasal sinuses. Superimposed left maxillary sinus retention cyst. No significant mastoid effusion. Other: None. IMPRESSION: 1. No acute intracranial abnormality. 2. Moderately advanced chronic microvascular ischemic disease for age. Electronically Signed   By: Devin White M.D.   On: 04/19/2023 02:14   DG Chest Port 1 View  Result Date: 04/19/2023 CLINICAL DATA:  Chest pain EXAM: PORTABLE CHEST 1 VIEW COMPARISON:  11/15/2022 FINDINGS: The heart size and mediastinal contours are within normal limits. Both lungs are clear. The  visualized skeletal structures are unremarkable. IMPRESSION: No active disease. Electronically Signed   By: Devin White M.D.   On: 04/19/2023 01:02   US ABDOMEN LIMITED RUQ (LIVER/GB)  Result Date: 04/18/2023 CLINICAL DATA:  Transaminitis EXAM: ULTRASOUND ABDOMEN LIMITED RIGHT UPPER QUADRANT COMPARISON:  None Available. FINDINGS: Gallbladder: Prior cholecystectomy. Common bile duct: Diameter: 7 mm  Liver: No focal lesion identified. Increased parenchymal echogenicity. Portal vein is patent on color Doppler imaging with normal direction of blood flow towards the liver. Other: None. IMPRESSION: 1.  Hepatic steatosis. 2. Mildly dilated common bile duct, within normal limits status post cholecystectomy. Electronically Signed   By: Devin White M.D.   On: 04/18/2023 20:21   CT Head Wo Contrast  Result Date: 04/18/2023 CLINICAL DATA:  Dizziness, blurred vision, stroke suspected EXAM: CT HEAD WITHOUT CONTRAST TECHNIQUE: Contiguous axial images were obtained from the base of the skull through the vertex without intravenous contrast. RADIATION DOSE REDUCTION: This exam was performed according to the departmental dose-optimization program which includes automated exposure control, adjustment of the mA and/or kV according to patient size and/or use of iterative reconstruction technique. COMPARISON:  11/07/2022 CT head FINDINGS: Brain: No evidence of acute infarction, hemorrhage, mass, mass effect, or midline shift. No hydrocephalus or extra-axial fluid collection. Periventricular white matter changes, likely the sequela of chronic small vessel ischemic disease. Vascular: No hyperdense vessel. Skull: Negative for fracture or focal lesion. Sinuses/Orbits: No acute finding. Other: The mastoid air cells are well aerated. IMPRESSION: No acute intracranial process. Electronically Signed   By: Devin White M.D.   On: 04/18/2023 17:27    Assessment and Plan Devin White is a 67 y.o. male with medical history significant of liver cirrhosis (s/p of liver transplantation 2009, on immunosuppressant), HTN, HLD, DM not on medications, diastolic CHF, stroke, gout, GERD, BPH, CKD-3B, IBS, who presents with dizziness, blurry vision, left-sided numbness and weakness, SOB, chest pain.   Hyperosmolar hyperglycemic state (HHS) and Type II diabetes mellitus with renal manifestations: blood sugar 676, anion gap 15,  bicarbonate 19, pH 7.3 by VBG, negative ketone in UA.  Mental status normal.  Recent A1c 7.0.  -- Patient was on insulin drip. Now transition to subcu long-acting insulin. -- Will continue sliding scale insulin. -- Dietitian input for diabetes diet appreciated. -- consult to diabetic educator input appreciated. -- A1c is 10.6 -- adjusting dose of insulin and added glipizide XL   Left sided numbness and weakness and hx of stroke: pt resports left-sided numbness and worsening weakness in left arm and leg.  CT head negative. -Continue aspirin, Crestor - MRI of brain neg for new stroke -PT/OT--HHPT   Chest pain: Patient reports shortness of breath and chest pain, which is pressure-like, nonpleuritic, low suspicions of PE. -As needed nitroglycerin -Aspirin, Crestor -Trend troponin flat and neg -resolved    Acute renal failure superimposed on stage 3b chronic kidney disease (HCC): Likely due to dehydration and continuation of lisinopril and diuretics. -Hold Lasix and lisinopril -recieved IVF creat 2.15--1.5   History of liver transplant (HCC) -Continue home prednisone 10 mg daily, tacrolimus and CellCept -Carefully use as needed Tylenol at low-dose (patient can only use NSAIDs due to worsening renal function)   Chronic diastolic heart failure (HCC): 2D echo on 11/07/2022 showed EF of 60-85% with grade 1 diastolic dysfunction.  Patient reports shortness breath, but no leg edema or JVD.  BNP 24.1, CHF is compensated. -Hold Lasix due to worsening renal function and HHS   Leukocytosis: WBC 14.1, no  fever, no signs of infection identified.  Likely due to steroid use -Follow-up with CBC   BPH (benign prostatic hyperplasia): -Flomax   Hypertension -IV hydralazine as needed -Hold lisinopril due to worsening renal function -Amlodipine   HLD (hyperlipidemia) -Crestor   Obesity (BMI 30-39.9): Body weight 120.2 kg, BMI 34.96 -Exercise in healthy diet -Encourage losing weight    Family  communication :none Consults :none CODE STATUS: full DVT Prophylaxis : Level of care: Telemetry Medical Status is: Inpatient Remains inpatient appropriate because: HHS, monitor sugars for one more day    TOTAL TIME TAKING CARE OF THIS PATIENT: 35 minutes.  >50% time spent on counselling and coordination of care  Note: This dictation was prepared with Dragon dictation along with smaller phrase technology. Any transcriptional errors that result from this process are unintentional.  Enedina Finner M.D    [facility]   CC: Primary care physician; Karie Schwalbe, MD

## 2023-04-20 NOTE — Plan of Care (Signed)
  Problem: Coping: Goal: Level of anxiety will decrease Outcome: Progressing   Problem: Elimination: Goal: Will not experience complications related to bowel motility Outcome: Progressing   Problem: Pain Management: Goal: General experience of comfort will improve Outcome: Progressing   Problem: Safety: Goal: Ability to remain free from injury will improve Outcome: Progressing   Problem: Skin Integrity: Goal: Risk for impaired skin integrity will decrease Outcome: Progressing

## 2023-04-21 ENCOUNTER — Other Ambulatory Visit (HOSPITAL_COMMUNITY): Payer: Self-pay

## 2023-04-21 DIAGNOSIS — E1100 Type 2 diabetes mellitus with hyperosmolarity without nonketotic hyperglycemic-hyperosmolar coma (NKHHC): Secondary | ICD-10-CM | POA: Diagnosis not present

## 2023-04-21 LAB — GLUCOSE, CAPILLARY
Glucose-Capillary: 167 mg/dL — ABNORMAL HIGH (ref 70–99)
Glucose-Capillary: 245 mg/dL — ABNORMAL HIGH (ref 70–99)
Glucose-Capillary: 215 mg/dL — ABNORMAL HIGH (ref 70–99)

## 2023-04-21 MED ORDER — GLIPIZIDE ER 10 MG PO TB24
10.0000 mg | ORAL_TABLET | Freq: Every day | ORAL | Status: DC
  Administered 2023-04-21: 10 mg via ORAL
  Filled 2023-04-21: qty 1

## 2023-04-21 MED ORDER — GLUCERNA SHAKE PO LIQD: 237.0000 mL | Freq: Two times a day (BID) | ORAL | 0 refills | Status: DC

## 2023-04-21 MED ORDER — ENSURE ENLIVE PO LIQD: 237.0000 mL | Freq: Two times a day (BID) | ORAL | Status: DC

## 2023-04-21 MED ORDER — INSULIN GLARGINE 100 UNIT/ML SOLOSTAR PEN: 25.0000 [IU] | PEN_INJECTOR | Freq: Every day | SUBCUTANEOUS | 11 refills | Status: DC

## 2023-04-21 MED ORDER — GLIPIZIDE ER 2.5 MG PO TB24: 2.5000 mg | ORAL_TABLET | Freq: Every day | ORAL | Status: DC

## 2023-04-21 MED ORDER — GLIPIZIDE ER 2.5 MG PO TB24: 2.5000 mg | ORAL_TABLET | Freq: Every day | ORAL | 1 refills | Status: DC

## 2023-04-21 MED ORDER — INSULIN STARTER KIT- PEN NEEDLES (ENGLISH): 1.0000 | Freq: Once | 0 refills | Status: AC

## 2023-04-21 MED ORDER — BLOOD GLUCOSE TEST VI STRP: 1.0000 | ORAL_STRIP | Freq: Three times a day (TID) | 0 refills | Status: AC

## 2023-04-21 MED ORDER — LANCET DEVICE MISC: 1.0000 | Freq: Three times a day (TID) | 0 refills | Status: AC

## 2023-04-21 MED ORDER — LANCETS MISC. MISC: 1.0000 | Freq: Three times a day (TID) | 3 refills | Status: AC

## 2023-04-21 MED ORDER — BLOOD GLUCOSE MONITORING SUPPL DEVI: 1.0000 | Freq: Three times a day (TID) | 0 refills | Status: AC

## 2023-04-21 MED ORDER — INSULIN LISPRO (1 UNIT DIAL) 100 UNIT/ML (KWIKPEN): PEN_INJECTOR | SUBCUTANEOUS | 11 refills | Status: DC

## 2023-04-21 MED ORDER — GLIPIZIDE ER 10 MG PO TB24: 10.0000 mg | ORAL_TABLET | Freq: Every day | ORAL | 2 refills | Status: DC

## 2023-04-21 MED ORDER — LISINOPRIL 40 MG PO TABS: 20.0000 mg | ORAL_TABLET | Freq: Every day | ORAL | 1 refills | Status: DC

## 2023-04-21 NOTE — Care Management Important Message (Signed)
Important Message  Patient Details  Name: Devin White MRN: [id number] Date of Birth: February 14, 1956   Important Message Given:  N/A - LOS <3 / Initial given by admissions     Olegario Messier A Annleigh Knueppel 04/21/2023, 9:58 AM

## 2023-04-21 NOTE — TOC Initial Note (Signed)
Transition of Care [facility]) - Initial/Assessment Note    Patient Details  Name: Devin White MRN: [id number] Date of Birth: 1956-03-16  Transition of Care [facility]) CM/SW Contact:    Marlowe Sax, RN Phone Number: 04/21/2023, 1:30 PM  Clinical Narrative:                 Met with the patient in there oom and discussed DC plan and needs He lives at home alone He has DME at home and does jot want more Caryl Asp with [facility] accepted the patient for [facility] services He stated that he drove to the hospital and will drive home  Expected Discharge Plan: Home w Home Health Services Barriers to Discharge: No Barriers Identified   Patient Goals and CMS Choice            Expected Discharge Plan and Services   Discharge Planning Services: CM Consult   Living arrangements for the past 2 months: Single Family Home                 DME Arranged: N/A DME Agency: NA       HH Arranged: PT HH Agency: [facility] Date HH Agency Contacted: 04/21/23 Time HH Agency Contacted: 1330 Representative spoke with at [facility] Agency: Tresa Endo  Prior Living Arrangements/Services Living arrangements for the past 2 months: Single Family Home Lives with:: Self Patient language and need for interpreter reviewed:: Yes Do you feel safe going back to the place where you live?: Yes      Need for Family Participation in Patient Care: Yes (Comment) Care giver support system in place?: Yes (comment) Current home services: DME (rolling walkerm rollator, 3 in1) Criminal Activity/Legal Involvement Pertinent to Current Situation/Hospitalization: No - Comment as needed  Activities of Daily Living   ADL Screening (condition at time of admission) Independently performs ADLs?: Yes (appropriate for developmental age) Is the patient deaf or have difficulty hearing?: No Does the patient have difficulty seeing, even when wearing glasses/contacts?: Yes Does the patient have difficulty concentrating, remembering,  or making decisions?: No  Permission Sought/Granted   Permission granted to share information with : Yes, Verbal Permission Granted              Emotional Assessment Appearance:: Appears stated age Attitude/Demeanor/Rapport: Engaged Affect (typically observed): Accepting, Pleasant Orientation: : Oriented to Self, Oriented to Place, Oriented to  Time, Oriented to Situation Alcohol / Substance Use: Not Applicable Psych Involvement: No (comment)  Admission diagnosis:  Hyperglycemia [R73.9] Blurry vision, bilateral [H53.8] Hyperosmolar hyperglycemic state (HHS) (HCC) [E11.00] Acute renal failure superimposed on chronic kidney disease, unspecified acute renal failure type, unspecified CKD stage (HCC) [N17.9, N18.9] Patient Active Problem List   Diagnosis Date Noted   Hyperosmolar hyperglycemic state (HHS) (HCC) 04/18/2023   hx of stroke 04/18/2023   Leukocytosis 04/18/2023   Type II diabetes mellitus with renal manifestations (HCC) 04/18/2023   HLD (hyperlipidemia) 04/18/2023   Acute renal failure superimposed on stage 3b chronic kidney disease (HCC) 04/18/2023   Newly diagnosed diabetes (HCC) 02/14/2023   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) 11/27/2022   Acquired thrombophilia (HCC) 11/27/2022   Elevated TSH 11/27/2022   Allergic reaction 11/17/2022   Postural dizziness with presyncope 11/16/2022   History of superior mesenteric vein thrombosis, postprocedural 2022 11/16/2022   History of recent stroke 11/16/2022   Acute bacterial prostatitis 11/05/22 11/09/2022   Pain of both sacroiliac joints 11/06/2022   Elevated liver function tests 11/06/2022   Left  sided numbness 11/14/2021   Obesity (BMI 30-39.9) 10/27/2021   Diarrhea 10/26/2021   Stage 3a chronic kidney disease (CKD) (HCC) 10/26/2021   Hyperbilirubinemia 10/26/2021   Elevated blood sugar 10/26/2021   Chronic diastolic heart failure (HCC) 09/05/2021   Facial swelling 04/26/2021    Gastric intestinal metaplasia 01/01/2021   Sick euthyroidism 10/03/2020   Achilles tendon mass 09/25/2020   Recurrent UTI 03/26/2019   Pulmonary nodule 08/04/2018   History of liver transplant (HCC) 10/10/2017   Advance directive discussed with patient 10/10/2017   Stage 3b chronic kidney disease (HCC) 10/09/2016   Mallory-Weiss tear 04/16/2016   Long-term use of immunosuppressant medication 04/08/2016   Chest pain 10/06/2015   Sleep apnea 10/06/2015   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   Immunosuppression (HCC) 06/01/2012   Hypertension    Routine general medical examination at a health care facility 04/26/2011   Chronic tophaceous gout 12/21/2008   BPH (benign prostatic hyperplasia) 12/21/2008   ALLERGIC RHINITIS 05/22/2007   GERD 05/22/2007   HIATAL HERNIA 05/22/2007   IRRITABLE BOWEL SYNDROME, HX OF 05/22/2007   RENAL CALCULUS, HX OF 05/22/2007   PCP:  Karie Schwalbe, MD Pharmacy:   [id number] [place], [place] - [address] AT [facility] [address] [place] [address] Phone: [phone] Fax: [phone]     Social Determinants of Health (SDOH) Social History: SDOH Screenings   Food Insecurity: No Food Insecurity (12/23/2022)  Housing: Low Risk  (12/23/2022)  Transportation Needs: No Transportation Needs (12/23/2022)  Utilities: Not At Risk (12/10/2022)   Received from [facility], [facility]  Depression [phone]): Low Risk  (02/27/2023)  Financial Resource Strain: Medium Risk (12/23/2022)  Physical Activity: Insufficiently Active (12/23/2022)  Social Connections: Unknown (12/23/2022)  Stress: No Stress Concern Present (12/23/2022)  Tobacco Use: Low Risk  (03/11/2023)   Received from [facility]   SDOH Interventions:     Readmission Risk Interventions    11/09/2022    1:38 PM 01/31/2022    9:58 AM 11/06/2021   11:09 AM   Readmission Risk Prevention Plan  Transportation Screening Complete Complete Complete  PCP or Specialist Appt within 5-7 Days Complete  Complete  PCP or Specialist Appt within 3-5 Days  Complete   Home Care Screening Complete  Complete  Medication Review (RN CM) Referral to Pharmacy  Referral to Pharmacy  Social Work Consult for Recovery Care Planning/Counseling  Complete   Palliative Care Screening  Not Applicable   Medication Review (RN Care Manager)  Complete

## 2023-04-21 NOTE — Plan of Care (Addendum)
  RD consulted for nutrition education regarding diabetes.   Lab Results  Component Value Date   HGBA1C 10.6 (H) 04/19/2023   PTA DM medications are none.    Labs reviewed: CBGS: 167-245 (inpatient orders for glycemic control are 2.5 mg glipizide daily, 0-15 units insulin aspart TID with meals and at bedtime, 10 units insulin aspart TID with meals, and 25 units insulin glargine-yfgn daily).    Case discussed with MD and diabetes coordinator. Pt with discharge home today with insulin and CGM.   Spoke with pt at bedside, who was pleasant and in good spirits today. Pt had just met with DM coordinator for insulin education and CGM placement. Pt has a very positive outlook regarding DM diagnosis and expressed gratitude for all those caring for him. Pt shares that he was feeling weak over the past 2 weeks ans also was experiencing blurred vision (which has improved since admission). Pt typically consumes 3 meals per day (Breakfast: yogurt, Lunch and Dinner: chicken and potatoes). Pt admits to drinks soda, but is willing to drink diet sodas, stating he has drank this in the hospital and is willing to make this change to improve CGM levels.   Pt has a very good relationship with his PCP and is eager to partner with him for DM management. RD reviewed importance of DM self-management to optimize glycemic control and prevent further complications. Reviewed importance of rotating insulin injection sites, importance of monitoring CGM trends, foot care, medication compliance, and routine PCP follow-ups.   Discussed referral to [facility]; pt agreeable and willing to attend outpatient education.   RD provided "Carbohydrate Counting for People with Diabetes" and "Plate Method" handouts from the Academy of Nutrition and Dietetics. Discussed different food groups and their effects on blood sugar, emphasizing carbohydrate-containing foods. Provided list of carbohydrates and recommended serving sizes of common  foods.  Discussed importance of controlled and consistent carbohydrate intake throughout the day. Provided examples of ways to balance meals/snacks and encouraged intake of high-fiber, whole grain complex carbohydrates. Teach back method used.  Expect fair to good compliance.  Body mass index is 34.96 kg/m. Pt meets criteria for obesity, class I based on current BMI.  Current diet order is carb modified, patient is consuming approximately 100% of meals at this time. Labs and medications reviewed. No further nutrition interventions warranted at this time. RD contact information provided. If additional nutrition issues arise, please re-consult RD.  Levada Schilling, RD, LDN, CDCES Registered Dietitian III Certified Diabetes Care and Education Specialist Please refer to [facility] for RD and/or RD on-call/weekend/after hours pager

## 2023-04-21 NOTE — Progress Notes (Signed)
04/21/23 @ 1045: Called [facility] pharmacy [phone]  to change glipizide XL from 10mg  to 2.5 mg daily after discussion with MD.  Sherron Monday with Raquel Sarna PharmD Clinical Pharmacist 04/21/2023

## 2023-04-21 NOTE — Progress Notes (Signed)
Physical Therapy Treatment Patient Details Name: Devin White MRN: [id number] DOB: 1955/08/02 Today's Date: 04/21/2023   History of Present Illness Pt is a 67 y.o. male with medical history significant of liver cirrhosis (s/p of liver transplantation 2009, on immunosuppressant), HTN, HLD, DM not on medications, diastolic CHF, stroke, gout, GERD, BPH, CKD-3B, IBS, who presents with dizziness, blurry vision, left-sided numbness and weakness, presenting to ED on 04/18/23 for SOB, chest pain. Found to have BGL 676 on admission    PT Comments  Pt was pleasant and motivated to participate during the session and put forth good effort throughout. Pt required no physical assist for bed mobility or transfers. Pt required CGA only for safety and RW management during gait and stair training. Pt tolerated session well, no increase in pain and only adverse symptom of min SOB observed following bouts of gait. Vitals monitored throughout session and remained WNL. Pt will benefit from continued PT services upon discharge to safely address deficits listed in patient problem list for decreased caregiver assistance and eventual return to PLOF.    If plan is discharge home, recommend the following: Help with stairs or ramp for entrance   Can travel by private vehicle        Equipment Recommendations  Rolling walker (2 wheels)    Recommendations for Other Services       Precautions / Restrictions Precautions Precautions: None Restrictions Weight Bearing Restrictions: No     Mobility  Bed Mobility Overal bed mobility: Modified Independent Bed Mobility: Supine to Sit     Supine to sit: HOB elevated, Used rails, Modified independent (Device/Increase time)     General bed mobility comments: extra time    Transfers Overall transfer level: Needs assistance Equipment used: Rolling walker (2 wheels) Transfers: Sit to/from Stand Sit to Stand: Contact guard assist           General transfer  comment: initially mild unsteadiness upon standing and req extra time, but improvement seen with subsequent attemps    Ambulation/Gait Ambulation/Gait assistance: Contact guard assist Gait Distance (Feet): 160 Feet x2 Assistive device: Rolling walker (2 wheels) Gait Pattern/deviations: Step-through pattern, Wide base of support Gait velocity: decreased     General Gait Details: increased med/lat weight shifting, mild unsteadiness, verbal cuing for body placement within RW, slight SOB following but returned to normal with ~2 mins rest   Stairs Stairs: Yes Stairs assistance: Contact guard assist Stair Management: One rail Right, Step to pattern, Forwards, With walker Number of Stairs: 4 General stair comments: pt able to ascend/descend stairs with folded-RW in one hand, reliant on single rail in other hand for balance, cuing for step-to sequencing given prior L LE weakness from CVA   Wheelchair Mobility     Tilt Bed    Modified Rankin (Stroke Patients Only)       Balance Overall balance assessment: Needs assistance Sitting-balance support: Feet supported, Single extremity supported Sitting balance-Leahy Scale: Good Sitting balance - Comments: Pt able to maintain seated EOB balance, able to self-adjust/weight shift   Standing balance support: During functional activity, Bilateral upper extremity supported Standing balance-Leahy Scale: Good Standing balance comment: Pt able to fold up RW without physical assist at base of stairs prior to gait in standing                            Cognition Arousal: Alert Behavior During Therapy: [facility] for tasks assessed/performed Overall Cognitive Status: Within Functional Limits for  tasks assessed                                 General Comments: AO x4; Pleasant and cooperative with PT sessions        Exercises Other Exercises Other Exercises: Education delivered for stair navigation once home for safety and  AD management given prior CVA weakness    General Comments        Pertinent Vitals/Pain Pain Assessment Pain Assessment: 0-10 Pain Score: 4  Pain Location: L thigh/quad Pain Descriptors / Indicators: Sore, Numbness Pain Intervention(s): Monitored during session, Repositioned    Home Living                          Prior Function            PT Goals (current goals can now be found in the care plan section) Acute Rehab PT Goals Patient Stated Goal: to get L side stronger PT Goal Formulation: With patient Time For Goal Achievement: 05/03/23 Potential to Achieve Goals: Good Progress towards PT goals: Progressing toward goals    Frequency    Min 1X/week      PT Plan      Co-evaluation              AM-PAC PT "6 Clicks" Mobility   Outcome Measure  Help needed turning from your back to your side while in a flat bed without using bedrails?: None Help needed moving from lying on your back to sitting on the side of a flat bed without using bedrails?: None Help needed moving to and from a bed to a chair (including a wheelchair)?: None Help needed standing up from a chair using your arms (e.g., wheelchair or bedside chair)?: None Help needed to walk in hospital room?: A Little Help needed climbing 3-5 steps with a railing? : A Little 6 Click Score: 22    End of Session Equipment Utilized During Treatment: Gait belt Activity Tolerance: Patient tolerated treatment well Patient left: with call bell/phone within reach;in chair;with chair alarm set Nurse Communication: Mobility status;Other (comment) (req ice water) PT Visit Diagnosis: Unsteadiness on feet (R26.81);Muscle weakness (generalized) (M62.81);Hemiplegia and hemiparesis Hemiplegia - Right/Left: Left Hemiplegia - dominant/non-dominant: Non-dominant Hemiplegia - caused by:  (prior CVA)     Time: 4259-5638 PT Time Calculation (min) (ACUTE ONLY): 29 min  Charges:                            Rosiland Oz SPT 04/21/23, 3:13 PM

## 2023-04-21 NOTE — Inpatient Diabetes Management (Signed)
Inpatient Diabetes Program Recommendations  AACE/ADA: New Consensus Statement on Inpatient Glycemic Control (2015)  Target Ranges:  Prepandial:   less than 140 mg/dL      Peak postprandial:   less than 180 mg/dL (1-2 hours)      Critically ill patients:  140 - 180 mg/dL   Lab Results  Component Value Date   GLUCAP 167 (H) 04/21/2023   HGBA1C 10.6 (H) 04/19/2023    Latest Reference Range & Units 04/20/23 08:02 04/20/23 11:54 04/20/23 16:28 04/20/23 21:42 04/21/23 08:21  Glucose-Capillary 70 - 99 mg/dL 034 (H) 742 (H) 595 (H) 253 (H) 167 (H)  (H): Data is abnormally high  Review of Glycemic Control  Diabetes history: DM2 Outpatient Diabetes medications: None Current orders for Inpatient glycemic control: Semglee 25 units daily, Novolog 10 units tid, Novolog 0-15 units qid, Glucotrol 2.5 mg daily, Prednisone 10 mg daily  Inpatient Diabetes Program Recommendations:   Educated patient on insulin pen use at home. Reviewed contents of insulin flexpen starter kit. Reviewed all steps if insulin pen including attachment of needle, 2-unit air shot, dialing up dose, giving injection, removing needle, disposal of sharps, storage of unused insulin, disposal of insulin etc. Patient able to provide successful return demonstration. Also reviewed troubleshooting with insulin pen. MD to give patient Rxs for insulin pens and insulin pen needles.   MD ordered application of Freestyle CGM at discharge for patient. Education done regarding application and changing CGM sensor (alternate every 14 days on back of arms), 1 hour warm-up, use of glucometer when alert displays, how to scan CGM for glucose reading and information for PCP. Patient has also been given educational packet regarding use CGM sensor including the 1-800 toll free number for any questions, problems or needs related to the [facility] sensors or reader.    Sensor applied by patient to (L) Arm at 11:15 am.  Explained that glucose readings will  not be available until 1 hour after application. Reviewed use of CGM including how to scan, changing Sensor, Vitamin C warning, arrows with glucose readings, and Freestyle app. Patient very appreciative.   Requested prior authorization for Libre 3 CGM and preferred list of long and short acting insulin. Libre 3 sensor # [id number]  Thank you, Billy Fischer. Makelle Marrone, RN, MSN, CDCES  Diabetes Coordinator Inpatient Glycemic Control Team Team Pager [phone] (8am-5pm) 04/21/2023 12:12 PM

## 2023-04-21 NOTE — TOC Benefit Eligibility Note (Signed)
Patient Product/process development scientist completed.    The patient is insured through [facility]. Patient has Medicare and is not eligible for a copay card, but may be able to apply for patient assistance, if available.    Ran test claim for Humalog Kwikpen and the current 30 day co-pay is $0.00.  Ran test claim for Dexcom G7 Sensor and the current 30 day co-pay is $0.00.  Ran test claim for [employer] and the current 30 day co-pay is $0.00.  This test claim was processed through [facility]- copay amounts may vary at other pharmacies due to pharmacy/plan contracts, or as the patient moves through the different stages of their insurance plan.     Roland Earl, CPHT Pharmacy Technician III Certified Patient Advocate [facility] Patient Advocate Team Direct Number: [phone]  Fax: [phone]

## 2023-04-21 NOTE — Discharge Summary (Signed)
Physician Discharge Summary   Patient: Devin White MRN: [id number] DOB: [birth year]  Admit date:     04/18/2023  Discharge date: 04/21/23  Discharge Physician: Enedina Finner   PCP: Karie Schwalbe, MD   Recommendations at discharge:    Keep log of sugars for PCP to review F/u Dr Alphonsus Sias in 1-2 days--discuss sugar check and BMP  Discharge Diagnoses: Principal Problem:   Hyperosmolar hyperglycemic state (HHS) [facility]) Active Problems:   Left sided numbness   Type II diabetes mellitus with renal manifestations (HCC)   Chest pain   hx of stroke   Acute renal failure superimposed on stage 3b chronic kidney disease (HCC)   History of liver transplant (HCC)   Chronic diastolic heart failure (HCC)   Leukocytosis   BPH (benign prostatic hyperplasia)   Hypertension   HLD (hyperlipidemia)   Obesity (BMI 30-39.9)    Devin White is a 67 y.o. male with medical history significant of liver cirrhosis (s/p of liver transplantation 2009, on immunosuppressant), HTN, HLD, DM not on medications, diastolic CHF, stroke, gout, GERD, BPH, CKD-3B, IBS, who presents with dizziness, blurry vision, left-sided numbness and weakness, SOB, chest pain.    Hyperosmolar hyperglycemic state (HHS) and Type II diabetes mellitus with renal manifestations: blood sugar 676, anion gap 15, bicarbonate 19, pH 7.3 by VBG, negative ketone in UA.  Mental status normal.  Recent A1c 7.0.  -- Patient was on insulin drip. Now transition to subcu long-acting insulin. --  sliding scale insulin. -- Dietitian input for diabetes diet appreciated. -- consult to diabetic educator input appreciated. -- A1c is 10.6 -- adjusting dose of insulin long and short acting and added glipizide XL 2.5 mg   Left sided numbness and weakness and hx of stroke: pt resports left-sided numbness and worsening weakness in left arm and leg.  CT head negative. -Continue aspirin, Crestor - MRI of brain neg for new stroke -PT/OT--HHPT   Chest  pain: Patient reports shortness of breath and chest pain, which is pressure-like, nonpleuritic, low suspicions of PE. -As needed nitroglycerin -Aspirin, Crestor -Trend troponin flat and neg -resolved     Acute renal failure superimposed on stage 3b chronic kidney disease (HCC): Likely due to dehydration and continuation of lisinopril and diuretics. -Hold Lasix and lisinopril -recieved IVF creat 2.15--1.5   History of liver transplant (HCC) -Continue home prednisone 10 mg daily, tacrolimus and CellCept -Carefully use as needed Tylenol at low-dose (patient can only use NSAIDs due to worsening renal function)   Chronic diastolic heart failure (HCC): 2D echo on 11/07/2022 showed EF of 60-85% with grade 1 diastolic dysfunction.  Patient reports shortness breath, but no leg edema or JVD.  BNP 24.1, CHF is compensated. - I have resumed lasix --defer to PCP to adjust dosing according to BMP   Leukocytosis: WBC 14.1, no fever, no signs of infection identified.  Likely due to steroid use   BPH (benign prostatic hyperplasia): -Flomax   Hypertension -IV hydralazine as needed -decreased lisinopril dose to 20 mg (40 mg) at d/c -Amlodipine   HLD (hyperlipidemia) -Crestor   Obesity (BMI 30-39.9): Body weight 120.2 kg, BMI 34.96 -Exercise in healthy diet -Encourage losing weight  Pt to f/u PCP in 1 week and discuss sugars and check BMP as outpt     Family communication :none Consults :none CODE STATUS: full DVT Prophylaxis :    Disposition: Home health Diet recommendation:  Cardiac and Carb modified diet DISCHARGE MEDICATION: Allergies as of 04/21/2023  Reactions   Levaquin [levofloxacin] Shortness Of Breath   SOB and severe leg pain/weakness        Medication List     TAKE these medications    acetaminophen 325 MG tablet Commonly known as: TYLENOL Take 650 mg by mouth every 6 (six) hours as needed for mild pain.   amLODipine 10 MG tablet Commonly known as:  NORVASC Take 1 tablet (10 mg total) by mouth daily.   aspirin EC 81 MG tablet Take 1 tablet (81 mg total) by mouth daily. Swallow whole.   Blood Glucose Monitoring Suppl Devi 1 each by Does not apply route in the morning, at noon, and at bedtime. May substitute to any manufacturer covered by patient's insurance.   BLOOD GLUCOSE TEST STRIPS Strp 1 each by In Vitro route in the morning, at noon, and at bedtime. May substitute to any manufacturer covered by patient's insurance.   calcium carbonate 500 MG chewable tablet Commonly known as: Tums Chew 1 tablet (200 mg of elemental calcium total) by mouth 2 (two) times daily as needed for indigestion or heartburn.   feeding supplement (GLUCERNA SHAKE) Liqd Take 237 mLs by mouth 2 (two) times daily between meals.   furosemide 40 MG tablet Commonly known as: LASIX Take 40 mg by mouth daily.   gabapentin 300 MG capsule Commonly known as: NEURONTIN Take 300 mg by mouth 3 (three) times daily.   glipiZIDE 2.5 MG 24 hr tablet Commonly known as: GLUCOTROL XL Take 1 tablet (2.5 mg total) by mouth daily with breakfast. Start taking on: April 22, 2023   insulin glargine 100 UNIT/ML Solostar Pen Commonly known as: LANTUS Inject 25 Units into the skin daily.   insulin lispro 100 UNIT/ML KwikPen Commonly known as: HumaLOG KwikPen 0-15 Units, Subcutaneous, 3 times daily before meals & bedtime  CBG 70 - 120: 0 units  CBG 121 - 150: 2 units  CBG 151 - 200: 3 units  CBG 201 - 250: 5 units  CBG 251 - 300: 8 units  CBG 301 - 350: 11 units  CBG 351 - 400: 15 units  CBG > 400: call MD   insulin starter kit- pen needles Misc 1 kit by Other route once for 1 dose.   Lancet Device Misc 1 each by Does not apply route in the morning, at noon, and at bedtime. May substitute to any manufacturer covered by patient's insurance.   Lancets Misc. Misc 1 each by Does not apply route in the morning, at noon, and at bedtime. May substitute to any  manufacturer covered by patient's insurance.   lisinopril 40 MG tablet Commonly known as: ZESTRIL Take 0.5 tablets (20 mg total) by mouth daily. What changed: how much to take   methocarbamol 500 MG tablet Commonly known as: ROBAXIN Take by mouth 2 (two) times daily.   multivitamin with minerals Tabs tablet Take 1 tablet by mouth daily.   mycophenolate 250 MG capsule Commonly known as: CELLCEPT Take 250-500 mg by mouth 2 (two) times daily. 2 capsules (500 mg) in the morning and one capsule (250 mg) in the evening   nortriptyline 10 MG capsule Commonly known as: PAMELOR Take 10 mg by mouth 2 (two) times daily.   omeprazole 20 MG capsule Commonly known as: PRILOSEC TAKE 1 CAPSULE(20 MG) BY MOUTH DAILY   predniSONE 10 MG tablet Commonly known as: DELTASONE Take 10 mg by mouth daily with breakfast.   rosuvastatin 20 MG tablet Commonly known as: CRESTOR Take 1 tablet (20  mg total) by mouth daily.   tacrolimus 0.5 MG capsule Commonly known as: PROGRAF Take 0.5-1 mg by mouth See admin instructions. Taking 0.5 mg in the morning and 1 mg at bedtime   tamsulosin 0.4 MG Caps capsule Commonly known as: FLOMAX TAKE 1 CAPSULE(0.4 MG) BY MOUTH DAILY   trimethoprim 100 MG tablet Commonly known as: TRIMPEX Take 100 mg by mouth daily.        Follow-up Information     Karie Schwalbe, MD. Schedule an appointment as soon as possible for a visit in 1 week(s).   Specialties: Internal Medicine, Pediatrics Contact information: [address] [place] [address] [phone]                Filed Weights   04/18/23 1452  Weight: 120.2 kg     Condition at discharge: fair  The results of significant diagnostics from this hospitalization (including imaging, microbiology, ancillary and laboratory) are listed below for reference.   Imaging Studies: MR BRAIN WO CONTRAST  Result Date: 04/19/2023 CLINICAL DATA:  Initial evaluation for neuro deficit, stroke  suspected. EXAM: MRI HEAD WITHOUT CONTRAST TECHNIQUE: Multiplanar, multiecho pulse sequences of the brain and surrounding structures were obtained without intravenous contrast. COMPARISON:  Prior CT from 04/18/2023. FINDINGS: Brain: Cerebral volume within normal limits for age. Patchy and confluent T2/FLAIR hyperintensity involving the periventricular deep white matter both cerebral hemispheres, consistent with chronic small vessel ischemic disease, moderately advanced in nature. No abnormal foci of restricted diffusion to suggest acute or subacute ischemia. Gray-white matter differentiation maintained. No areas of chronic cortical infarction. No acute intracranial hemorrhage. Single punctate chronic microhemorrhage noted at the left occipital lobe, of doubtful significance in isolation. No mass lesion, midline shift or mass effect. No hydrocephalus or extra-axial fluid collection. Pituitary gland and suprasellar region within normal limits. Vascular: Major intracranial vascular flow voids are maintained. Skull and upper cervical spine: Cranial junction within normal limits. Bone marrow signal intensity normal. No scalp soft tissue abnormality. Sinuses/Orbits: Globes and orbital soft tissues within normal limits. Mild scattered mucosal thickening noted about the paranasal sinuses. Superimposed left maxillary sinus retention cyst. No significant mastoid effusion. Other: None. IMPRESSION: 1. No acute intracranial abnormality. 2. Moderately advanced chronic microvascular ischemic disease for age. Electronically Signed   By: Rise Mu M.D.   On: 04/19/2023 02:14   DG Chest Port 1 View  Result Date: 04/19/2023 CLINICAL DATA:  Chest pain EXAM: PORTABLE CHEST 1 VIEW COMPARISON:  11/15/2022 FINDINGS: The heart size and mediastinal contours are within normal limits. Both lungs are clear. The visualized skeletal structures are unremarkable. IMPRESSION: No active disease. Electronically Signed   By: Charlett Nose  M.D.   On: 04/19/2023 01:02   US ABDOMEN LIMITED RUQ (LIVER/GB)  Result Date: 04/18/2023 CLINICAL DATA:  Transaminitis EXAM: ULTRASOUND ABDOMEN LIMITED RIGHT UPPER QUADRANT COMPARISON:  None Available. FINDINGS: Gallbladder: Prior cholecystectomy. Common bile duct: Diameter: 7 mm Liver: No focal lesion identified. Increased parenchymal echogenicity. Portal vein is patent on color Doppler imaging with normal direction of blood flow towards the liver. Other: None. IMPRESSION: 1.  Hepatic steatosis. 2. Mildly dilated common bile duct, within normal limits status post cholecystectomy. Electronically Signed   By: Allegra Lai M.D.   On: 04/18/2023 20:21   CT Head Wo Contrast  Result Date: 04/18/2023 CLINICAL DATA:  Dizziness, blurred vision, stroke suspected EXAM: CT HEAD WITHOUT CONTRAST TECHNIQUE: Contiguous axial images were obtained from the base of the skull through the vertex without intravenous  contrast. RADIATION DOSE REDUCTION: This exam was performed according to the departmental dose-optimization program which includes automated exposure control, adjustment of the mA and/or kV according to patient size and/or use of iterative reconstruction technique. COMPARISON:  11/07/2022 CT head FINDINGS: Brain: No evidence of acute infarction, hemorrhage, mass, mass effect, or midline shift. No hydrocephalus or extra-axial fluid collection. Periventricular white matter changes, likely the sequela of chronic small vessel ischemic disease. Vascular: No hyperdense vessel. Skull: Negative for fracture or focal lesion. Sinuses/Orbits: No acute finding. Other: The mastoid air cells are well aerated. IMPRESSION: No acute intracranial process. Electronically Signed   By: Wiliam Ke M.D.   On: 04/18/2023 17:27    Microbiology: Results for orders placed or performed during the hospital encounter of 04/18/23  SARS Coronavirus 2 by RT PCR (hospital order, performed in [facility] hospital lab) *cepheid single  result test* Anterior Nasal Swab     Status: None   Collection Time: 04/19/23  2:13 AM   Specimen: Anterior Nasal Swab  Result Value Ref Range Status   SARS Coronavirus 2 by RT PCR NEGATIVE NEGATIVE Final    Comment: (NOTE) SARS-CoV-2 target nucleic acids are NOT DETECTED.  The SARS-CoV-2 RNA is generally detectable in upper and lower respiratory specimens during the acute phase of infection. The lowest concentration of SARS-CoV-2 viral copies this assay can detect is 250 copies / mL. A negative result does not preclude SARS-CoV-2 infection and should not be used as the sole basis for treatment or other patient management decisions.  A negative result may occur with improper specimen collection / handling, submission of specimen other than nasopharyngeal swab, presence of viral mutation(s) within the areas targeted by this assay, and inadequate number of viral copies (<250 copies / mL). A negative result must be combined with clinical observations, patient history, and epidemiological information.  Fact Sheet for Patients:   RoadLapTop.co.za  Fact Sheet for Healthcare Providers: http://kim-miller.com/  This test is not yet approved or  cleared by the [place] FDA and has been authorized for detection and/or diagnosis of SARS-CoV-2 by FDA under an Emergency Use Authorization (EUA).  This EUA will remain in effect (meaning this test can be used) for the duration of the COVID-19 declaration under Section 564(b)(1) of the Act, 21 U.S.C. section 360bbb-3(b)(1), unless the authorization is terminated or revoked sooner.  Performed at [facility], [address] [place], [place] [address]     Labs: CBC: Recent Labs  Lab 04/18/23 1457  WBC 14.1*  HGB 14.1  HCT 41.1  MCV 94.3  PLT 190   Basic Metabolic Panel: Recent Labs  Lab 04/18/23 2325 04/19/23 0212 04/19/23 0326 04/19/23 0620 04/19/23 0753  NA 136 139  139 138 139  K 3.9 4.0 3.8 4.1 4.2  CL 106 108 108 108 107  CO2 23 24 24 24 24   GLUCOSE 235* 186* 207* 218* 230*  BUN 20 20 21 20 20   CREATININE 1.77* 1.67* 1.72* 1.53* 1.60*  CALCIUM 8.3* 8.5* 8.5* 8.5* 8.6*   Liver Function Tests: Recent Labs  Lab 04/18/23 1457  AST 50*  ALT 60*  ALKPHOS 62  BILITOT 2.8*  PROT 6.6  ALBUMIN 3.9   CBG: Recent Labs  Lab 04/20/23 0802 04/20/23 1154 04/20/23 1628 04/20/23 2142 04/21/23 0821  GLUCAP 220* 353* 350* 253* 167*    Discharge time spent: greater than 30 minutes.  Signed: Enedina Finner, MD [facility] 04/21/2023

## 2023-04-21 NOTE — Plan of Care (Signed)
Problem: Education: Goal: Ability to describe self-care measures that may prevent or decrease complications (Diabetes Survival Skills Education) will improve Outcome: Adequate for Discharge Goal: Individualized Educational Video(s) Outcome: Adequate for Discharge   Problem: Coping: Goal: Ability to adjust to condition or change in health will improve Outcome: Adequate for Discharge   Problem: Fluid Volume: Goal: Ability to maintain a balanced intake and output will improve Outcome: Adequate for Discharge   Problem: Health Behavior/Discharge Planning: Goal: Ability to identify and utilize available resources and services will improve Outcome: Adequate for Discharge Goal: Ability to manage health-related needs will improve Outcome: Adequate for Discharge   Problem: Metabolic: Goal: Ability to maintain appropriate glucose levels will improve Outcome: Adequate for Discharge   Problem: Nutritional: Goal: Maintenance of adequate nutrition will improve Outcome: Adequate for Discharge Goal: Progress toward achieving an optimal weight will improve Outcome: Adequate for Discharge   Problem: Skin Integrity: Goal: Risk for impaired skin integrity will decrease Outcome: Adequate for Discharge   Problem: Tissue Perfusion: Goal: Adequacy of tissue perfusion will improve Outcome: Adequate for Discharge   Problem: Education: Goal: Ability to describe self-care measures that may prevent or decrease complications (Diabetes Survival Skills Education) will improve Outcome: Adequate for Discharge Goal: Individualized Educational Video(s) Outcome: Adequate for Discharge   Problem: Cardiac: Goal: Ability to maintain an adequate cardiac output will improve Outcome: Adequate for Discharge   Problem: Health Behavior/Discharge Planning: Goal: Ability to identify and utilize available resources and services will improve Outcome: Adequate for Discharge Goal: Ability to manage health-related  needs will improve Outcome: Adequate for Discharge   Problem: Fluid Volume: Goal: Ability to achieve a balanced intake and output will improve Outcome: Adequate for Discharge   Problem: Metabolic: Goal: Ability to maintain appropriate glucose levels will improve Outcome: Adequate for Discharge   Problem: Nutritional: Goal: Maintenance of adequate nutrition will improve Outcome: Adequate for Discharge Goal: Maintenance of adequate weight for body size and type will improve Outcome: Adequate for Discharge   Problem: Respiratory: Goal: Will regain and/or maintain adequate ventilation Outcome: Adequate for Discharge   Problem: Urinary Elimination: Goal: Ability to achieve and maintain adequate renal perfusion and functioning will improve Outcome: Adequate for Discharge   Problem: Education: Goal: Knowledge of General Education information will improve Description: Including pain rating scale, medication(s)/side effects and non-pharmacologic comfort measures Outcome: Adequate for Discharge   Problem: Health Behavior/Discharge Planning: Goal: Ability to manage health-related needs will improve Outcome: Adequate for Discharge   Problem: Clinical Measurements: Goal: Ability to maintain clinical measurements within normal limits will improve Outcome: Adequate for Discharge Goal: Will remain free from infection Outcome: Adequate for Discharge Goal: Diagnostic test results will improve Outcome: Adequate for Discharge Goal: Respiratory complications will improve Outcome: Adequate for Discharge Goal: Cardiovascular complication will be avoided Outcome: Adequate for Discharge   Problem: Activity: Goal: Risk for activity intolerance will decrease Outcome: Adequate for Discharge   Problem: Nutrition: Goal: Adequate nutrition will be maintained Outcome: Adequate for Discharge   Problem: Coping: Goal: Level of anxiety will decrease Outcome: Adequate for Discharge   Problem:  Elimination: Goal: Will not experience complications related to bowel motility Outcome: Adequate for Discharge Goal: Will not experience complications related to urinary retention Outcome: Adequate for Discharge   Problem: Pain Management: Goal: General experience of comfort will improve Outcome: Adequate for Discharge   Problem: Safety: Goal: Ability to remain free from injury will improve Outcome: Adequate for Discharge   Problem: Skin Integrity: Goal: Risk for impaired skin integrity will decrease  Outcome: Adequate for Discharge

## 2023-04-21 NOTE — Plan of Care (Signed)
  Problem: Coping: Goal: Ability to adjust to condition or change in health will improve Outcome: Progressing   

## 2023-04-22 ENCOUNTER — Telehealth: Payer: Self-pay

## 2023-04-22 NOTE — Consult Note (Signed)
[facility] Liaison Note  04/22/2023  Devin White 05-01-1956 [id number]  Location: RN Hospital Liaison screened the patient remotely at [facility]  Insurance: [employer]   [name] is a 67 y.o. male who is a Primary Care Patient of Karie Schwalbe, MD [facility]. The patient was screened for  readmission hospitalization with noted high risk score for unplanned readmission risk with 2 IP in 6 months.  The patient was assessed for potential Care Management service needs for post hospital transition for care coordination. Review of patient's electronic medical record reveals patient was admitted with Hyperosmolar hyperglycemic. Pt will [facility] home with [facility] No other anticipated needs for community at this time.  Plan: [facility] Liaison will continue to follow progress and disposition to asess for post hospital community care coordination/management needs.  Referral request for community care coordination: anticipate Transitions of Care Team follow up.   [facility] Care Management/Population Health does not replace or interfere with any arrangements made by the Inpatient Transition of Care team.   For questions contact:   Elliot Cousin, RN, [facility] Liaison [place]   [facility], Population Health Office Hours MTWF  8:00 am-6:00 pm Direct Dial: [phone] mobile [phone] [Office toll free line] Office Hours are M-F 8:30 - 5 pm [email or web address]

## 2023-04-22 NOTE — Transitions of Care (Post Inpatient/ED Visit) (Signed)
   04/22/2023  Name: KEETON KASSEBAUM MRN: [id number] DOB: 1956-04-01  Today's TOC FU Call Status: Today's TOC FU Call Status:: Unsuccessful Call (1st Attempt) Unsuccessful Call (1st Attempt) Date: 04/22/23  Attempted to reach the patient regarding the most recent Inpatient/ED visit.  Follow Up Plan: Additional outreach attempts will be made to reach the patient to complete the Transitions of Care (Post Inpatient/ED visit) call.   Deidre Ala, RN Medical illustrator [facility]-Population Health [phone]

## 2023-04-29 ENCOUNTER — Ambulatory Visit: Payer: 59 | Admitting: Dietician

## 2023-05-01 ENCOUNTER — Encounter: Payer: Self-pay | Admitting: Internal Medicine

## 2023-05-01 ENCOUNTER — Ambulatory Visit: Payer: 59 | Admitting: Internal Medicine

## 2023-05-01 VITALS — BP 94/60 | HR 86 | Temp 97.6°F | Ht 73.0 in | Wt 266.0 lb

## 2023-05-01 DIAGNOSIS — N183 Chronic kidney disease, stage 3 unspecified: Secondary | ICD-10-CM | POA: Diagnosis not present

## 2023-05-01 DIAGNOSIS — Z944 Liver transplant status: Secondary | ICD-10-CM

## 2023-05-01 DIAGNOSIS — Z23 Encounter for immunization: Secondary | ICD-10-CM | POA: Diagnosis not present

## 2023-05-01 DIAGNOSIS — Z794 Long term (current) use of insulin: Secondary | ICD-10-CM

## 2023-05-01 DIAGNOSIS — M5412 Radiculopathy, cervical region: Secondary | ICD-10-CM | POA: Diagnosis not present

## 2023-05-01 DIAGNOSIS — E1122 Type 2 diabetes mellitus with diabetic chronic kidney disease: Secondary | ICD-10-CM | POA: Diagnosis not present

## 2023-05-01 DIAGNOSIS — M1A9XX1 Chronic gout, unspecified, with tophus (tophi): Secondary | ICD-10-CM | POA: Diagnosis not present

## 2023-05-01 DIAGNOSIS — Z7984 Long term (current) use of oral hypoglycemic drugs: Secondary | ICD-10-CM

## 2023-05-01 MED ORDER — INSULIN GLARGINE 100 UNIT/ML SOLOSTAR PEN: 30.0000 [IU] | PEN_INJECTOR | Freq: Every day | SUBCUTANEOUS | 0 refills | Status: DC

## 2023-05-01 MED ORDER — GABAPENTIN 300 MG PO CAPS: 300.0000 mg | ORAL_CAPSULE | Freq: Three times a day (TID) | ORAL | 3 refills | Status: DC

## 2023-05-01 MED ORDER — FREESTYLE LIBRE 3 PLUS SENSOR MISC: 12 refills | Status: AC

## 2023-05-01 MED ORDER — COLCHICINE 0.6 MG PO TABS: 0.6000 mg | ORAL_TABLET | Freq: Every day | ORAL | 1 refills | Status: DC | PRN

## 2023-05-01 NOTE — Assessment & Plan Note (Signed)
Complicated situation with sudden worsening to hyperosmolar state Still high--will increase lantus to 30 units daily Continue lispro coverage and glipizide 2.5mg  daily Will refer to endocrine

## 2023-05-01 NOTE — Addendum Note (Signed)
Addended by: Eual Fines on: 05/01/2023 10:42 AM   Modules accepted: Orders

## 2023-05-01 NOTE — Assessment & Plan Note (Signed)
Going to transplant team next week

## 2023-05-01 NOTE — Assessment & Plan Note (Signed)
Is doing better with gabapentin 300mg  tid

## 2023-05-01 NOTE — Assessment & Plan Note (Addendum)
Pain controlled with topical diclofenac Will give colchicine to try once a day prn

## 2023-05-01 NOTE — Progress Notes (Signed)
Subjective:    Patient ID: Devin White, male    DOB: 1956/06/08, 67 y.o.   MRN: [id number]  HPI Here for hospital follow up Admitted to [facility] 11/1 with hyperosmolar hyperglycemic state AKI on top of CKD Received fluids and insulin drip with improvement of sugars A1c was up to 10.6% Had some left sided sensory changes--but CT/MRI negative for stroke  No clear reason for the sugars to go so quickly out of control He was getting ERROR on his machine Had noted blurry vision  Now on glipizide Lantus 25 daily Pre-meal lispro---- usually taking about 8 units based on scale Now has continuous glucose monitor--average 220's. Time in range-- 23% okay--41% to 250 and 36% over 250  No chest pain No SOB  Current Outpatient Medications on File Prior to Visit  Medication Sig Dispense Refill   acetaminophen (TYLENOL) 325 MG tablet Take 650 mg by mouth every 6 (six) hours as needed for mild pain.     amLODipine (NORVASC) 10 MG tablet Take 1 tablet (10 mg total) by mouth daily. 90 tablet 3   aspirin EC 81 MG tablet Take 1 tablet (81 mg total) by mouth daily. Swallow whole. 30 tablet 0   Blood Glucose Monitoring Suppl DEVI 1 each by Does not apply route in the morning, at noon, and at bedtime. May substitute to any manufacturer covered by patient's insurance. 1 each 0   calcium carbonate (TUMS) 500 MG chewable tablet Chew 1 tablet (200 mg of elemental calcium total) by mouth 2 (two) times daily as needed for indigestion or heartburn. 180 tablet 1   feeding supplement, GLUCERNA SHAKE, (GLUCERNA SHAKE) LIQD Take 237 mLs by mouth 2 (two) times daily between meals. 237 mL 0   furosemide (LASIX) 40 MG tablet Take 40 mg by mouth daily.     gabapentin (NEURONTIN) 300 MG capsule Take 300 mg by mouth 3 (three) times daily.     glipiZIDE (GLUCOTROL XL) 2.5 MG 24 hr tablet Take 1 tablet (2.5 mg total) by mouth daily with breakfast. 30 tablet 1   Glucose Blood (BLOOD GLUCOSE TEST STRIPS) STRP 1 each by In  Vitro route in the morning, at noon, and at bedtime. May substitute to any manufacturer covered by patient's insurance. 90 each 0   insulin glargine (LANTUS) 100 UNIT/ML Solostar Pen Inject 25 Units into the skin daily. 15 mL 11   insulin lispro (HUMALOG KWIKPEN) 100 UNIT/ML KwikPen 0-15 Units, Subcutaneous, 3 times daily before meals & bedtime  CBG 70 - 120: 0 units  CBG 121 - 150: 2 units  CBG 151 - 200: 3 units  CBG 201 - 250: 5 units  CBG 251 - 300: 8 units  CBG 301 - 350: 11 units  CBG 351 - 400: 15 units  CBG > 400: call MD 15 mL 11   Lancet Device MISC 1 each by Does not apply route in the morning, at noon, and at bedtime. May substitute to any manufacturer covered by patient's insurance. 1 each 0   Lancets Misc. MISC 1 each by Does not apply route in the morning, at noon, and at bedtime. May substitute to any manufacturer covered by patient's insurance. 100 each 3   lisinopril (ZESTRIL) 40 MG tablet Take 0.5 tablets (20 mg total) by mouth daily. 30 tablet 1   methocarbamol (ROBAXIN) 500 MG tablet Take by mouth 2 (two) times daily.     Multiple Vitamin (MULTIVITAMIN WITH MINERALS) TABS tablet Take 1 tablet by mouth  daily.     mycophenolate (CELLCEPT) 250 MG capsule Take 250-500 mg by mouth 2 (two) times daily. 2 capsules (500 mg) in the morning and one capsule (250 mg) in the evening     nortriptyline (PAMELOR) 10 MG capsule Take 10 mg by mouth 2 (two) times daily.     omeprazole (PRILOSEC) 20 MG capsule TAKE 1 CAPSULE(20 MG) BY MOUTH DAILY 30 capsule 11   predniSONE (DELTASONE) 10 MG tablet Take 10 mg by mouth daily with breakfast.     rosuvastatin (CRESTOR) 20 MG tablet Take 1 tablet (20 mg total) by mouth daily. 90 tablet 3   tacrolimus (PROGRAF) 0.5 MG capsule Take 0.5-1 mg by mouth See admin instructions. Taking 0.5 mg in the morning and 1 mg at bedtime     tamsulosin (FLOMAX) 0.4 MG CAPS capsule TAKE 1 CAPSULE(0.4 MG) BY MOUTH DAILY 90 capsule 3   trimethoprim (TRIMPEX) 100 MG  tablet Take 100 mg by mouth daily.     No current facility-administered medications on file prior to visit.    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   History of cirrhosis of liver S/P TRANSPLANT 2009   History of liver failure S/P TRANSPLANT   Hypertension    Left ureteral calculus    Renal disorder    Stroke [facility])     Past Surgical History:  Procedure Laterality Date   APPENDECTOMY     bone morrow biopsy     CHOLECYSTECTOMY  2007   CYSTO/ LEFT RETROGRADE PYELOGRAM/ LEFT URETERAL STENT PLACEMENT  03-05-2012  DR Berneice Heinrich [facility])   LEFT URETERAL CALCULI   CYSTOSCOPY W/ URETERAL STENT PLACEMENT  03/11/2012   Procedure: CYSTOSCOPY WITH STENT REPLACEMENT;  Surgeon: Sebastian Ache, MD;  [facility];  Service: Urology;  Laterality: Left;   HERNIA REPAIR  2008   LIVER BIOPSY     LIVER TRANSPLANT  11/24/2007   pt states doing well since liver transplant   LUMBAR DISC SURGERY     LUMBAR FUSION     removal of fistula     URETEROSCOPY  03/11/2012   Procedure: URETEROSCOPY;  Surgeon: Sebastian Ache, MD;  Location: [facility];  Service: Urology;  Laterality: Left;   STONE MANIPULATION, stone obtained  [phone] [employer] MCR    Family History  Problem Relation Age of Onset   Cancer Mother        colon   Arthritis Mother    Vasculitis Mother    Hypertension Mother    Cancer Father        colon   Kidney disease Father    Arthritis Father    Arthritis Brother    Alcohol abuse Maternal Aunt    Diabetes Maternal Aunt    Alcohol abuse Maternal Uncle    Diabetes Paternal Aunt    Alcohol  abuse Maternal Uncle    Alcohol abuse Maternal Uncle     Social History   Socioeconomic History   Marital status: Single    Spouse name: Not on file   Number of children: 3  Years of education: Not on file   Highest education level: 9th grade  Occupational History   Occupation: disabled    Employer: RETIRED  Tobacco Use   Smoking status: Never    Passive exposure: Past   Smokeless tobacco: Never  Vaping Use   Vaping status: Never Used  Substance and Sexual Activity   Alcohol use: No   Drug use: No   Sexual activity: Not on file  Other Topics Concern   Not on file  Social History Narrative   Has living will   Requests daughter [name] as his health care POA   Would accept brief attempt at resuscitation but no prolonged ventilation   No tube feeds if cognitively unaware   Social Determinants of Health   Financial Resource Strain: Medium Risk (12/23/2022)   Overall Financial Resource Strain (CARDIA)    Difficulty of Paying Living Expenses: Somewhat hard  Food Insecurity: No Food Insecurity (12/23/2022)   Hunger Vital Sign    Worried About Running Out of Food in the Last Year: Never true    Ran Out of Food in the Last Year: Never true  Transportation Needs: No Transportation Needs (12/23/2022)   PRAPARE - Administrator, Civil Service (Medical): No    Lack of Transportation (Non-Medical): No  Physical Activity: Insufficiently Active (12/23/2022)   Exercise Vital Sign    Days of Exercise per Week: 2 days    Minutes of Exercise per Session: 10 min  Stress: No Stress Concern Present (12/23/2022)   [employer] of Occupational Health - Occupational Stress Questionnaire    Feeling of Stress : Not at all  Social Connections: Unknown (12/23/2022)   Social Connection and Isolation Panel [NHANES]    Frequency of Communication with Friends and Family: More than three times a week    Frequency of Social Gatherings with Friends and Family: Once a week    Attends  Religious Services: Patient declined    Database administrator or Organizations: No    Attends Banker Meetings: Never    Marital Status: Divorced  Catering manager Violence: Not At Risk (11/06/2022)   Humiliation, Afraid, Rape, and Kick questionnaire    Fear of Current or Ex-Partner: No    Emotionally Abused: No    Physically Abused: No    Sexually Abused: No   Review of Systems Some left Achilles pain--wonders about gout Is eating healthier Weight down a few pounds Sleep is variable--better last night     Objective:   Physical Exam Constitutional:      Appearance: Normal appearance.  Cardiovascular:     Rate and Rhythm: Normal rate and regular rhythm.     Heart sounds: No murmur heard.    No gallop.  Pulmonary:     Effort: Pulmonary effort is normal.     Breath sounds: Normal breath sounds. No wheezing or rales.  Musculoskeletal:     Cervical back: Neck supple.     Comments: No striking inflammation in left Achilles  Lymphadenopathy:     Cervical: No cervical adenopathy.  Neurological:     Mental Status: He is alert.            Assessment & Plan:

## 2023-05-05 ENCOUNTER — Encounter: Payer: Self-pay | Admitting: *Deleted

## 2023-05-08 DIAGNOSIS — D849 Immunodeficiency, unspecified: Secondary | ICD-10-CM | POA: Diagnosis not present

## 2023-05-08 DIAGNOSIS — Z944 Liver transplant status: Secondary | ICD-10-CM | POA: Diagnosis not present

## 2023-05-08 DIAGNOSIS — N189 Chronic kidney disease, unspecified: Secondary | ICD-10-CM | POA: Diagnosis not present

## 2023-05-08 DIAGNOSIS — Z79624 Long term (current) use of inhibitors of nucleotide synthesis: Secondary | ICD-10-CM | POA: Diagnosis not present

## 2023-05-08 DIAGNOSIS — T8643 Liver transplant infection: Secondary | ICD-10-CM | POA: Diagnosis not present

## 2023-05-08 DIAGNOSIS — D84821 Immunodeficiency due to drugs: Secondary | ICD-10-CM | POA: Diagnosis not present

## 2023-05-08 DIAGNOSIS — K754 Autoimmune hepatitis: Secondary | ICD-10-CM | POA: Diagnosis not present

## 2023-05-08 DIAGNOSIS — Z79621 Long term (current) use of calcineurin inhibitor: Secondary | ICD-10-CM | POA: Diagnosis not present

## 2023-05-08 DIAGNOSIS — K760 Fatty (change of) liver, not elsewhere classified: Secondary | ICD-10-CM | POA: Diagnosis not present

## 2023-06-13 ENCOUNTER — Telehealth: Payer: Self-pay

## 2023-06-13 ENCOUNTER — Other Ambulatory Visit: Payer: Self-pay | Admitting: Internal Medicine

## 2023-06-13 MED ORDER — GLIPIZIDE ER 2.5 MG PO TB24: 2.5000 mg | ORAL_TABLET | Freq: Every day | ORAL | 1 refills | Status: DC

## 2023-06-13 MED ORDER — INSULIN PEN NEEDLE 32G X 4 MM MISC: 1.0000 | 11 refills | Status: DC

## 2023-06-13 NOTE — Telephone Encounter (Signed)
Copied from CRM [id number]. Topic: Clinical - Medication Question >> Jun 13, 2023 11:45 AM Melissa C wrote: Reason for CRM: Patient recently diagnosed as diabetic- wondering if he needs to continue taking glipiZIDE (GLUCOTROL XL) 2.5 MG 24 hr tablet and if so he will need refills of the medication as he has not been able to get in with diabetes MD yet. Also needs refills of insulin injectors but do not see them on the list per his description. Patient stated you could give him a call.

## 2023-06-13 NOTE — Telephone Encounter (Signed)
Copied from CRM [id number]. Topic: Clinical - Prescription Issue >> Jun 13, 2023  2:27 PM Adele Barthel wrote: Reason for CRM: Pt returning message from [facility]. He is requiring the BD Nano 2nd Generation 4 mm pen needles to be sent to his pharmacy. [id number] [place], [place] - [address] OF [address] [phone] Fax:[phone]

## 2023-06-13 NOTE — Addendum Note (Signed)
Addended by: Eual Fines on: 06/13/2023 03:03 PM   Modules accepted: Orders

## 2023-06-13 NOTE — Addendum Note (Signed)
Addended by: Eual Fines on: 06/13/2023 02:20 PM   Modules accepted: Orders

## 2023-06-13 NOTE — Telephone Encounter (Signed)
Rx sent electronically.  

## 2023-06-13 NOTE — Telephone Encounter (Signed)
Left message on VM per DPR asking pt to let me know if he needs Lantus or Humalog. And to stay on the glipizide. I have sent that to the pharmacy for him.

## 2023-06-23 DIAGNOSIS — E1169 Type 2 diabetes mellitus with other specified complication: Secondary | ICD-10-CM | POA: Diagnosis not present

## 2023-06-23 DIAGNOSIS — N1831 Chronic kidney disease, stage 3a: Secondary | ICD-10-CM | POA: Diagnosis not present

## 2023-06-23 DIAGNOSIS — E1165 Type 2 diabetes mellitus with hyperglycemia: Secondary | ICD-10-CM | POA: Diagnosis not present

## 2023-06-23 DIAGNOSIS — E162 Hypoglycemia, unspecified: Secondary | ICD-10-CM | POA: Diagnosis not present

## 2023-06-23 DIAGNOSIS — E119 Type 2 diabetes mellitus without complications: Secondary | ICD-10-CM | POA: Diagnosis not present

## 2023-06-23 DIAGNOSIS — E1122 Type 2 diabetes mellitus with diabetic chronic kidney disease: Secondary | ICD-10-CM | POA: Diagnosis not present

## 2023-06-23 DIAGNOSIS — Z794 Long term (current) use of insulin: Secondary | ICD-10-CM | POA: Diagnosis not present

## 2023-06-23 DIAGNOSIS — I10 Essential (primary) hypertension: Secondary | ICD-10-CM | POA: Diagnosis not present

## 2023-07-28 DIAGNOSIS — Z794 Long term (current) use of insulin: Secondary | ICD-10-CM | POA: Diagnosis not present

## 2023-07-28 DIAGNOSIS — E1165 Type 2 diabetes mellitus with hyperglycemia: Secondary | ICD-10-CM | POA: Diagnosis not present

## 2023-07-29 ENCOUNTER — Other Ambulatory Visit: Payer: Self-pay | Admitting: Internal Medicine

## 2023-08-04 ENCOUNTER — Encounter: Payer: Self-pay | Admitting: Internal Medicine

## 2023-08-04 ENCOUNTER — Ambulatory Visit (INDEPENDENT_AMBULATORY_CARE_PROVIDER_SITE_OTHER): Payer: 59 | Admitting: Internal Medicine

## 2023-08-04 VITALS — BP 110/80 | HR 96 | Ht 73.0 in | Wt 268.0 lb

## 2023-08-04 DIAGNOSIS — N1831 Chronic kidney disease, stage 3a: Secondary | ICD-10-CM | POA: Diagnosis not present

## 2023-08-04 DIAGNOSIS — E1122 Type 2 diabetes mellitus with diabetic chronic kidney disease: Secondary | ICD-10-CM | POA: Diagnosis not present

## 2023-08-04 DIAGNOSIS — E119 Type 2 diabetes mellitus without complications: Secondary | ICD-10-CM

## 2023-08-04 DIAGNOSIS — Z944 Liver transplant status: Secondary | ICD-10-CM

## 2023-08-04 DIAGNOSIS — E1165 Type 2 diabetes mellitus with hyperglycemia: Secondary | ICD-10-CM | POA: Diagnosis not present

## 2023-08-04 DIAGNOSIS — Z7984 Long term (current) use of oral hypoglycemic drugs: Secondary | ICD-10-CM | POA: Diagnosis not present

## 2023-08-04 DIAGNOSIS — E1121 Type 2 diabetes mellitus with diabetic nephropathy: Secondary | ICD-10-CM

## 2023-08-04 DIAGNOSIS — E1169 Type 2 diabetes mellitus with other specified complication: Secondary | ICD-10-CM | POA: Diagnosis not present

## 2023-08-04 DIAGNOSIS — I10 Essential (primary) hypertension: Secondary | ICD-10-CM | POA: Diagnosis not present

## 2023-08-04 DIAGNOSIS — Z794 Long term (current) use of insulin: Secondary | ICD-10-CM | POA: Diagnosis not present

## 2023-08-04 NOTE — Assessment & Plan Note (Signed)
Now well controlled again Still unclear how he got so high that quick Lispro pre meal---often just lunch and dinner Lantus 25 units Glipizide 2.5 mg daily

## 2023-08-04 NOTE — Progress Notes (Signed)
Subjective:    Patient ID: Devin White, male    DOB: 06-Mar-1956, 68 y.o.   MRN: [id number]  HPI Here for follow up of diabetes and other chronic health conditions  Is seeing Dr Devin White today Recent A1c was 6.7% Still has libre CGM---doing pretty well with this  No recent gout flares  No issues with liver Does keep up with transplant team  Current Outpatient Medications on File Prior to Visit  Medication Sig Dispense Refill   acetaminophen (TYLENOL) 325 MG tablet Take 650 mg by mouth every 6 (six) hours as needed for mild pain.     amLODipine (NORVASC) 10 MG tablet Take 1 tablet (10 mg total) by mouth daily. 90 tablet 3   aspirin EC 81 MG tablet Take 1 tablet (81 mg total) by mouth daily. Swallow whole. 30 tablet 0   Blood Glucose Monitoring Suppl DEVI 1 each by Does not apply route in the morning, at noon, and at bedtime. May substitute to any manufacturer covered by patient's insurance. 1 each 0   calcium carbonate (TUMS) 500 MG chewable tablet Chew 1 tablet (200 mg of elemental calcium total) by mouth 2 (two) times daily as needed for indigestion or heartburn. 180 tablet 1   colchicine 0.6 MG tablet Take 1 tablet (0.6 mg total) by mouth daily as needed. 30 tablet 1   Continuous Glucose Sensor (FREESTYLE LIBRE 3 PLUS SENSOR) MISC Change sensor every 15 days. 2 each 12   feeding supplement, GLUCERNA SHAKE, (GLUCERNA SHAKE) LIQD Take 237 mLs by mouth 2 (two) times daily between meals. 237 mL 0   furosemide (LASIX) 40 MG tablet TAKE 1 TABLET(40 MG) BY MOUTH DAILY 90 tablet 3   gabapentin (NEURONTIN) 300 MG capsule Take 1 capsule (300 mg total) by mouth 3 (three) times daily. 270 capsule 3   glipiZIDE (GLUCOTROL XL) 2.5 MG 24 hr tablet TAKE 1 TABLET(2.5 MG) BY MOUTH DAILY WITH BREAKFAST 90 tablet 0   insulin glargine (LANTUS) 100 UNIT/ML Solostar Pen Inject 30 Units into the skin daily. 1 mL 0   insulin lispro (HUMALOG KWIKPEN) 100 UNIT/ML KwikPen 0-15 Units, Subcutaneous, 3 times  daily before meals & bedtime  CBG 70 - 120: 0 units  CBG 121 - 150: 2 units  CBG 151 - 200: 3 units  CBG 201 - 250: 5 units  CBG 251 - 300: 8 units  CBG 301 - 350: 11 units  CBG 351 - 400: 15 units  CBG > 400: call MD 15 mL 11   Insulin Pen Needle 32G X 4 MM MISC 1 each by Does not apply route as directed. 200 each 11   lisinopril (ZESTRIL) 40 MG tablet Take 0.5 tablets (20 mg total) by mouth daily. 30 tablet 1   methocarbamol (ROBAXIN) 500 MG tablet Take by mouth 2 (two) times daily.     Multiple Vitamin (MULTIVITAMIN WITH MINERALS) TABS tablet Take 1 tablet by mouth daily.     mycophenolate (CELLCEPT) 250 MG capsule Take 250-500 mg by mouth 2 (two) times daily. 2 capsules (500 mg) in the morning and one capsule (250 mg) in the evening     nortriptyline (PAMELOR) 10 MG capsule Take 10 mg by mouth 2 (two) times daily.     omeprazole (PRILOSEC) 20 MG capsule TAKE 1 CAPSULE(20 MG) BY MOUTH DAILY 30 capsule 11   predniSONE (DELTASONE) 10 MG tablet Take 10 mg by mouth daily with breakfast.     rosuvastatin (CRESTOR) 20 MG tablet Take  1 tablet (20 mg total) by mouth daily. 90 tablet 3   tacrolimus (PROGRAF) 0.5 MG capsule Take 0.5-1 mg by mouth See admin instructions. Taking 0.5 mg in the morning and 1 mg at bedtime     tamsulosin (FLOMAX) 0.4 MG CAPS capsule TAKE 1 CAPSULE(0.4 MG) BY MOUTH DAILY 90 capsule 3   trimethoprim (TRIMPEX) 100 MG tablet Take 100 mg by mouth daily.     No current facility-administered medications on file prior to visit.    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   History of cirrhosis of liver S/P  TRANSPLANT 2009   History of liver failure S/P TRANSPLANT   Hypertension    Left ureteral calculus    Renal disorder    Stroke Devin White)     Past Surgical History:  Procedure Laterality Date   APPENDECTOMY     bone morrow biopsy     CHOLECYSTECTOMY  2007   CYSTO/ LEFT RETROGRADE PYELOGRAM/ LEFT URETERAL STENT PLACEMENT  03-05-2012  DR Devin White [facility])   LEFT URETERAL CALCULI   CYSTOSCOPY W/ URETERAL STENT PLACEMENT  03/11/2012   Procedure: CYSTOSCOPY WITH STENT REPLACEMENT;  Surgeon: Devin Ache, MD;  Location: [facility];  Service: Urology;  Laterality: Left;   HERNIA REPAIR  2008   LIVER BIOPSY     LIVER TRANSPLANT  11/24/2007   pt states doing well since liver transplant   LUMBAR DISC SURGERY     LUMBAR FUSION     removal of fistula     URETEROSCOPY  03/11/2012   Procedure: URETEROSCOPY;  Surgeon: Devin Ache, MD;  Location: [facility];  Service: Urology;  Laterality: Left;   STONE MANIPULATION, stone obtained  [phone] [employer] MCR    Family History  Problem Relation Age of Onset   Cancer Mother        colon   Arthritis Mother    Vasculitis Mother    Hypertension Mother    Cancer Father        colon   Kidney disease Father    Arthritis Father    Arthritis Brother    Alcohol abuse Maternal Aunt    Diabetes Maternal Aunt    Alcohol abuse Maternal Uncle    Diabetes Paternal Aunt    Alcohol abuse Maternal Uncle    Alcohol abuse Maternal Uncle     Social History   Socioeconomic History   Marital status: Single    Spouse name: Not on file   Number of children: 3   Years of education: Not on file   Highest education level: 9th grade  Occupational History   Occupation: disabled    Employer: RETIRED  Tobacco Use   Smoking status: Never    Passive exposure: Past   Smokeless tobacco: Never  Vaping Use   Vaping status: Never Used  Substance and Sexual Activity   Alcohol use: No   Drug use: No   Sexual activity: Not on file  Other  Topics Concern   Not on file  Social History Narrative   Has living will  Requests daughter Devin White as his health care POA   Would accept brief attempt at resuscitation but no prolonged ventilation   No tube feeds if cognitively unaware   Social Drivers of Health   Financial Resource Strain: Low Risk  (07/28/2023)   Overall Financial Resource Strain (CARDIA)    Difficulty of Paying Living Expenses: Not very hard  Food Insecurity: No Food Insecurity (07/28/2023)   Hunger Vital Sign    Worried About Running Out of Food in the Last Year: Never true    Ran Out of Food in the Last Year: Never true  Transportation Needs: No Transportation Needs (07/28/2023)   PRAPARE - Administrator, Civil Service (Medical): No    Lack of Transportation (Non-Medical): No  Physical Activity: Insufficiently Active (07/28/2023)   Exercise Vital Sign    Days of Exercise per Week: 2 days    Minutes of Exercise per Session: 20 min  Stress: No Stress Concern Present (07/28/2023)   [employer] of Occupational Health - Occupational Stress Questionnaire    Feeling of Stress : Not at all  Social Connections: Socially Isolated (07/28/2023)   Social Connection and Isolation Panel [NHANES]    Frequency of Communication with Friends and Family: More than three times a week    Frequency of Social Gatherings with Friends and Family: Once a week    Attends Religious Services: Never    Database administrator or Organizations: No    Attends Engineer, structural: Not on file    Marital Status: Divorced  Intimate Partner Violence: Not At Risk (11/06/2022)   Humiliation, Afraid, Rape, and Kick questionnaire    Fear of Current or Ex-Partner: No    Emotionally Abused: No    Physically Abused: No    Sexually Abused: No   Review of Systems Due for eye recheck soon Appetite is okay--did cut out sugar and a lot of bread Gave up sugared drinks    Objective:   Physical Exam Constitutional:       Appearance: Normal appearance.  Neurological:     Mental Status: He is alert.  Psychiatric:        Mood and Affect: Mood normal.        Behavior: Behavior normal.            Assessment & Plan:

## 2023-08-04 NOTE — Assessment & Plan Note (Signed)
Keeps up with [facility] transplant team Cellcept 500/250, prednisone 10mg  daily, tacrolimus 0.5/1mg 

## 2023-08-11 DIAGNOSIS — I10 Essential (primary) hypertension: Secondary | ICD-10-CM | POA: Diagnosis not present

## 2023-08-18 ENCOUNTER — Ambulatory Visit: Payer: Self-pay | Admitting: Internal Medicine

## 2023-08-18 DIAGNOSIS — M5412 Radiculopathy, cervical region: Secondary | ICD-10-CM | POA: Diagnosis not present

## 2023-08-18 NOTE — Telephone Encounter (Signed)
 Reason for Disposition . [1] Systolic BP 90-110 AND [2] taking blood pressure medications AND [3] dizzy, lightheaded or weak  Answer Assessment - Initial Assessment Questions 1. BLOOD PRESSURE: "What is the blood pressure?" "Did you take at least two measurements 5 minutes apart?"     Unable to obtain right now 2. ONSET: "When did you take your blood pressure?"     Started experiencing symptoms over the weekend 3. HOW: "How did you obtain the blood pressure?" (e.g., visiting nurse, automatic home BP monitor)     MD office appt 4. HISTORY: "Do you have a history of low blood pressure?" "What is your blood pressure normally?"     None 5. MEDICINES: "Are you taking any medications for blood pressure?" If Yes, ask: "Have they been changed recently?"     Amlodipine 10 mg Lisnopril 40 mg 7. OTHER SYMPTOMS: "Have you been sick recently?" "Have you had a recent injury?"     Weakness, dizziness  Protocols used: Blood Pressure - Low-A-AH

## 2023-08-18 NOTE — Telephone Encounter (Addendum)
  Chief Complaint: Low blood pressure Symptoms: dizziness Frequency: began over the weekend Pertinent Negatives: Patient denies vision changes, falls Disposition: [] ED /[] Urgent Care (no appt availability in office) / [x] Appointment(In office/virtual)/ []  [place] [facility]/ [] Home Care/ [x] Refused Recommended Disposition /[] [place] [facility]/ []  Follow-up with PCP Additional Notes: Pt reports he's noted low BP readings that began over the weekend to include dizziness. Pt reports he is taking Amlodipine 10 mg/Lisinopril 40 mg daily and has been compliant with these. Pt denies vision changes but does report feeling weak. Pt advised to be seen today and declines. Advised message would be routed to provider for further review. This RN educated pt on home care, new-worsening symptoms, when to call back/seek emergent care. Pt verbalized understanding and agrees to plan.    1. BLOOD PRESSURE: "What is the blood pressure?" "Did you take at least two measurements 5 minutes apart?"     Unable to obtain right now 2. ONSET: "When did you take your blood pressure?"     Started experiencing symptoms over the weekend 3. HOW: "How did you obtain the blood pressure?" (e.g., visiting nurse, automatic home BP monitor)     MD office appt 4. HISTORY: "Do you have a history of low blood pressure?" "What is your blood pressure normally?"     None 5. MEDICINES: "Are you taking any medications for blood pressure?" If Yes, ask: "Have they been changed recently?"     Amlodipine 10 mg Lisnopril 40 mg 7. OTHER SYMPTOMS: "Have you been sick recently?" "Have you had a recent injury?"     Weakness, dizziness  This RN made 1st attempt to reach pt, "call cannot be completed as dialed" message.

## 2023-08-18 NOTE — Telephone Encounter (Signed)
 Copied from CRM [id number]. Topic: Clinical - Red Word Triage >> Aug 18, 2023  1:59 PM Prudencio Pair wrote: Red Word that prompted transfer to Nurse Triage: Patient states his blood pressure is running low. States he's currently at a clinic in [facility] getting ready to have an injection. He states the first time it was checked, it read 80/64 & now it's 96/64. Wants to know if PCP is needing to put him back on his blood pressure medication.     Patient called due concerns with low BP. He is currently at a doctor's appt and his BP was checked twice at the visit. He reported readings of 80/64 and 90/64. He is taking Lisinopril 40 mg and Amlodipine 10 daily. He mentioned that he has been having some dizziness today as well. He is questioning if the provider should decrease his BP medication. Unable to complete triage. During the call, pt stated that he can't talk right now because he is currently at an appointment having something done. He is requesting for someone to call him back in an hour.  Sending to callback folder for follow up.

## 2023-08-19 NOTE — Telephone Encounter (Signed)
 Spoke to pt. He will go back to 1/2 lisinopril 40 mg.

## 2023-08-23 ENCOUNTER — Other Ambulatory Visit: Payer: Self-pay | Admitting: Internal Medicine

## 2023-08-28 DIAGNOSIS — Z961 Presence of intraocular lens: Secondary | ICD-10-CM | POA: Diagnosis not present

## 2023-08-28 DIAGNOSIS — M3501 Sicca syndrome with keratoconjunctivitis: Secondary | ICD-10-CM | POA: Diagnosis not present

## 2023-08-28 DIAGNOSIS — H2512 Age-related nuclear cataract, left eye: Secondary | ICD-10-CM | POA: Diagnosis not present

## 2023-08-28 DIAGNOSIS — H2511 Age-related nuclear cataract, right eye: Secondary | ICD-10-CM | POA: Diagnosis not present

## 2023-08-28 DIAGNOSIS — E119 Type 2 diabetes mellitus without complications: Secondary | ICD-10-CM | POA: Diagnosis not present

## 2023-08-29 DIAGNOSIS — M542 Cervicalgia: Secondary | ICD-10-CM | POA: Diagnosis not present

## 2023-08-29 DIAGNOSIS — M5412 Radiculopathy, cervical region: Secondary | ICD-10-CM | POA: Diagnosis not present

## 2023-08-29 DIAGNOSIS — M9931 Osseous stenosis of neural canal of cervical region: Secondary | ICD-10-CM | POA: Diagnosis not present

## 2023-09-06 ENCOUNTER — Encounter: Payer: Self-pay | Admitting: Emergency Medicine

## 2023-09-06 ENCOUNTER — Inpatient Hospital Stay
Admission: EM | Admit: 2023-09-06 | Discharge: 2023-09-14 | DRG: 854 | Disposition: A | Discharge status: Home Health | Attending: Internal Medicine | Admitting: Internal Medicine

## 2023-09-06 ENCOUNTER — Other Ambulatory Visit: Payer: Self-pay

## 2023-09-06 ENCOUNTER — Emergency Department

## 2023-09-06 DIAGNOSIS — L089 Local infection of the skin and subcutaneous tissue, unspecified: Secondary | ICD-10-CM | POA: Diagnosis not present

## 2023-09-06 DIAGNOSIS — A411 Sepsis due to other specified staphylococcus: Principal | ICD-10-CM | POA: Diagnosis present

## 2023-09-06 DIAGNOSIS — N179 Acute kidney failure, unspecified: Secondary | ICD-10-CM | POA: Diagnosis not present

## 2023-09-06 DIAGNOSIS — K8689 Other specified diseases of pancreas: Secondary | ICD-10-CM | POA: Diagnosis not present

## 2023-09-06 DIAGNOSIS — K219 Gastro-esophageal reflux disease without esophagitis: Secondary | ICD-10-CM | POA: Diagnosis present

## 2023-09-06 DIAGNOSIS — Z944 Liver transplant status: Secondary | ICD-10-CM | POA: Diagnosis not present

## 2023-09-06 DIAGNOSIS — N401 Enlarged prostate with lower urinary tract symptoms: Secondary | ICD-10-CM | POA: Diagnosis present

## 2023-09-06 DIAGNOSIS — K611 Rectal abscess: Secondary | ICD-10-CM | POA: Diagnosis not present

## 2023-09-06 DIAGNOSIS — I672 Cerebral atherosclerosis: Secondary | ICD-10-CM | POA: Diagnosis not present

## 2023-09-06 DIAGNOSIS — E860 Dehydration: Secondary | ICD-10-CM | POA: Diagnosis not present

## 2023-09-06 DIAGNOSIS — Z79621 Long term (current) use of calcineurin inhibitor: Secondary | ICD-10-CM

## 2023-09-06 DIAGNOSIS — E1165 Type 2 diabetes mellitus with hyperglycemia: Secondary | ICD-10-CM | POA: Diagnosis not present

## 2023-09-06 DIAGNOSIS — K610 Anal abscess: Secondary | ICD-10-CM | POA: Diagnosis not present

## 2023-09-06 DIAGNOSIS — R55 Syncope and collapse: Secondary | ICD-10-CM | POA: Diagnosis present

## 2023-09-06 DIAGNOSIS — D84821 Immunodeficiency due to drugs: Secondary | ICD-10-CM | POA: Diagnosis present

## 2023-09-06 DIAGNOSIS — R338 Other retention of urine: Secondary | ICD-10-CM | POA: Diagnosis present

## 2023-09-06 DIAGNOSIS — E875 Hyperkalemia: Secondary | ICD-10-CM | POA: Diagnosis not present

## 2023-09-06 DIAGNOSIS — R197 Diarrhea, unspecified: Secondary | ICD-10-CM | POA: Diagnosis not present

## 2023-09-06 DIAGNOSIS — Z8619 Personal history of other infectious and parasitic diseases: Secondary | ICD-10-CM

## 2023-09-06 DIAGNOSIS — Z841 Family history of disorders of kidney and ureter: Secondary | ICD-10-CM

## 2023-09-06 DIAGNOSIS — Z79899 Other long term (current) drug therapy: Secondary | ICD-10-CM

## 2023-09-06 DIAGNOSIS — N189 Chronic kidney disease, unspecified: Secondary | ICD-10-CM | POA: Diagnosis not present

## 2023-09-06 DIAGNOSIS — E1122 Type 2 diabetes mellitus with diabetic chronic kidney disease: Secondary | ICD-10-CM | POA: Diagnosis not present

## 2023-09-06 DIAGNOSIS — M109 Gout, unspecified: Secondary | ICD-10-CM | POA: Diagnosis not present

## 2023-09-06 DIAGNOSIS — Z881 Allergy status to other antibiotic agents status: Secondary | ICD-10-CM

## 2023-09-06 DIAGNOSIS — E1142 Type 2 diabetes mellitus with diabetic polyneuropathy: Secondary | ICD-10-CM | POA: Diagnosis not present

## 2023-09-06 DIAGNOSIS — N493 Fournier gangrene: Secondary | ICD-10-CM | POA: Diagnosis present

## 2023-09-06 DIAGNOSIS — Z7982 Long term (current) use of aspirin: Secondary | ICD-10-CM

## 2023-09-06 DIAGNOSIS — K6289 Other specified diseases of anus and rectum: Secondary | ICD-10-CM | POA: Diagnosis not present

## 2023-09-06 DIAGNOSIS — R262 Difficulty in walking, not elsewhere classified: Secondary | ICD-10-CM | POA: Diagnosis present

## 2023-09-06 DIAGNOSIS — A408 Other streptococcal sepsis: Secondary | ICD-10-CM | POA: Diagnosis present

## 2023-09-06 DIAGNOSIS — M726 Necrotizing fasciitis: Secondary | ICD-10-CM | POA: Diagnosis not present

## 2023-09-06 DIAGNOSIS — I110 Hypertensive heart disease with heart failure: Secondary | ICD-10-CM | POA: Diagnosis not present

## 2023-09-06 DIAGNOSIS — E871 Hypo-osmolality and hyponatremia: Secondary | ICD-10-CM | POA: Diagnosis present

## 2023-09-06 DIAGNOSIS — Z9049 Acquired absence of other specified parts of digestive tract: Secondary | ICD-10-CM

## 2023-09-06 DIAGNOSIS — Z555 Less than a high school diploma: Secondary | ICD-10-CM

## 2023-09-06 DIAGNOSIS — R6520 Severe sepsis without septic shock: Secondary | ICD-10-CM | POA: Diagnosis present

## 2023-09-06 DIAGNOSIS — Z794 Long term (current) use of insulin: Secondary | ICD-10-CM

## 2023-09-06 DIAGNOSIS — Z7984 Long term (current) use of oral hypoglycemic drugs: Secondary | ICD-10-CM

## 2023-09-06 DIAGNOSIS — I5032 Chronic diastolic (congestive) heart failure: Secondary | ICD-10-CM | POA: Diagnosis not present

## 2023-09-06 DIAGNOSIS — A419 Sepsis, unspecified organism: Secondary | ICD-10-CM | POA: Diagnosis not present

## 2023-09-06 DIAGNOSIS — N1831 Chronic kidney disease, stage 3a: Secondary | ICD-10-CM | POA: Diagnosis not present

## 2023-09-06 DIAGNOSIS — M7989 Other specified soft tissue disorders: Secondary | ICD-10-CM | POA: Diagnosis present

## 2023-09-06 DIAGNOSIS — Z6835 Body mass index (BMI) 35.0-35.9, adult: Secondary | ICD-10-CM

## 2023-09-06 DIAGNOSIS — E66812 Obesity, class 2: Secondary | ICD-10-CM

## 2023-09-06 DIAGNOSIS — I130 Hypertensive heart and chronic kidney disease with heart failure and stage 1 through stage 4 chronic kidney disease, or unspecified chronic kidney disease: Secondary | ICD-10-CM | POA: Diagnosis not present

## 2023-09-06 DIAGNOSIS — R519 Headache, unspecified: Secondary | ICD-10-CM | POA: Diagnosis not present

## 2023-09-06 DIAGNOSIS — G4730 Sleep apnea, unspecified: Secondary | ICD-10-CM | POA: Diagnosis not present

## 2023-09-06 DIAGNOSIS — Z833 Family history of diabetes mellitus: Secondary | ICD-10-CM

## 2023-09-06 DIAGNOSIS — Z87442 Personal history of urinary calculi: Secondary | ICD-10-CM

## 2023-09-06 DIAGNOSIS — I10 Essential (primary) hypertension: Secondary | ICD-10-CM | POA: Diagnosis not present

## 2023-09-06 DIAGNOSIS — I9581 Postprocedural hypotension: Secondary | ICD-10-CM | POA: Diagnosis not present

## 2023-09-06 DIAGNOSIS — E785 Hyperlipidemia, unspecified: Secondary | ICD-10-CM | POA: Diagnosis not present

## 2023-09-06 DIAGNOSIS — Z8261 Family history of arthritis: Secondary | ICD-10-CM

## 2023-09-06 DIAGNOSIS — I2510 Atherosclerotic heart disease of native coronary artery without angina pectoris: Secondary | ICD-10-CM | POA: Diagnosis present

## 2023-09-06 DIAGNOSIS — Z79624 Long term (current) use of inhibitors of nucleotide synthesis: Secondary | ICD-10-CM

## 2023-09-06 DIAGNOSIS — Z8673 Personal history of transient ischemic attack (TIA), and cerebral infarction without residual deficits: Secondary | ICD-10-CM

## 2023-09-06 DIAGNOSIS — K5730 Diverticulosis of large intestine without perforation or abscess without bleeding: Secondary | ICD-10-CM | POA: Diagnosis not present

## 2023-09-06 DIAGNOSIS — Z8249 Family history of ischemic heart disease and other diseases of the circulatory system: Secondary | ICD-10-CM

## 2023-09-06 LAB — BASIC METABOLIC PANEL
Anion gap: 5 (ref 5–15)
BUN: 37 mg/dL — ABNORMAL HIGH (ref 8–23)
CO2: 20 mmol/L — ABNORMAL LOW (ref 22–32)
Calcium: 9 mg/dL (ref 8.9–10.3)
Chloride: 109 mmol/L (ref 98–111)
Creatinine, Ser: 2.67 mg/dL — ABNORMAL HIGH (ref 0.61–1.24)
GFR, Estimated: 26 mL/min — ABNORMAL LOW (ref >60)
Glucose, Bld: 172 mg/dL — ABNORMAL HIGH (ref 70–99)
Potassium: 4.8 mmol/L (ref 3.5–5.1)
Sodium: 134 mmol/L — ABNORMAL LOW (ref 135–145)

## 2023-09-06 LAB — CBC
HCT: 45.2 % (ref 39.0–52.0)
Hemoglobin: 15.2 g/dL (ref 13.0–17.0)
MCH: 31.9 pg (ref 26.0–34.0)
MCHC: 33.6 g/dL (ref 30.0–36.0)
MCV: 94.8 fL (ref 80.0–100.0)
Platelets: 183 10*3/uL (ref 150–400)
RBC: 4.77 MIL/uL (ref 4.22–5.81)
RDW: 14.7 % (ref 11.5–15.5)
WBC: 26.8 10*3/uL — ABNORMAL HIGH (ref 4.0–10.5)
nRBC: 0 % (ref 0.0–0.2)

## 2023-09-06 LAB — GLUCOSE, CAPILLARY: Glucose-Capillary: 110 mg/dL — ABNORMAL HIGH (ref 70–99)

## 2023-09-06 LAB — RESP PANEL BY RT-PCR (RSV, FLU A&B, COVID)  RVPGX2
Influenza A by PCR: NEGATIVE
Influenza B by PCR: NEGATIVE
Resp Syncytial Virus by PCR: NEGATIVE
SARS Coronavirus 2 by RT PCR: NEGATIVE

## 2023-09-06 LAB — HEPATIC FUNCTION PANEL
ALT: 22 U/L (ref 0–44)
AST: 23 U/L (ref 15–41)
Albumin: 4 g/dL (ref 3.5–5.0)
Alkaline Phosphatase: 37 U/L — ABNORMAL LOW (ref 38–126)
Bilirubin, Direct: 0.6 mg/dL — ABNORMAL HIGH (ref 0.0–0.2)
Indirect Bilirubin: 2.1 mg/dL — ABNORMAL HIGH (ref 0.3–0.9)
Total Bilirubin: 2.7 mg/dL — ABNORMAL HIGH (ref 0.0–1.2)
Total Protein: 6.8 g/dL (ref 6.5–8.1)

## 2023-09-06 LAB — URINALYSIS, W/ REFLEX TO CULTURE (INFECTION SUSPECTED)
Bacteria, UA: NONE SEEN
Bilirubin Urine: NEGATIVE
Glucose, UA: NEGATIVE mg/dL
Hgb urine dipstick: NEGATIVE
Ketones, ur: NEGATIVE mg/dL
Leukocytes,Ua: NEGATIVE
Nitrite: NEGATIVE
Protein, ur: NEGATIVE mg/dL
Specific Gravity, Urine: 1.017 (ref 1.005–1.030)
pH: 5 (ref 5.0–8.0)

## 2023-09-06 LAB — TROPONIN I (HIGH SENSITIVITY)
Troponin I (High Sensitivity): 23 ng/L — ABNORMAL HIGH (ref <18)
Troponin I (High Sensitivity): 21 ng/L — ABNORMAL HIGH (ref <18)

## 2023-09-06 LAB — LIPASE, BLOOD: Lipase: 28 U/L (ref 11–51)

## 2023-09-06 LAB — CBG MONITORING, ED: Glucose-Capillary: 173 mg/dL — ABNORMAL HIGH (ref 70–99)

## 2023-09-06 LAB — LACTIC ACID, PLASMA
Lactic Acid, Venous: 1.8 mmol/L (ref 0.5–1.9)
Lactic Acid, Venous: 1.2 mmol/L (ref 0.5–1.9)

## 2023-09-06 MED ORDER — ONDANSETRON HCL 4 MG/2ML IJ SOLN
4.0000 mg | Freq: Once | INTRAMUSCULAR | Status: AC
  Administered 2023-09-06: 4 mg via INTRAVENOUS
  Filled 2023-09-06: qty 2

## 2023-09-06 MED ORDER — LACTATED RINGERS IV SOLN: INTRAVENOUS | Status: DC

## 2023-09-06 MED ORDER — INSULIN ASPART 100 UNIT/ML IJ SOLN
0.0000 [IU] | Freq: Three times a day (TID) | INTRAMUSCULAR | Status: DC
  Administered 2023-09-08: 5 [IU] via SUBCUTANEOUS
  Administered 2023-09-08 – 2023-09-10 (×4): 3 [IU] via SUBCUTANEOUS
  Administered 2023-09-10: 5 [IU] via SUBCUTANEOUS
  Administered 2023-09-11: 3 [IU] via SUBCUTANEOUS
  Administered 2023-09-11: 2 [IU] via SUBCUTANEOUS
  Administered 2023-09-11: 3 [IU] via SUBCUTANEOUS
  Administered 2023-09-11 – 2023-09-12 (×2): 2 [IU] via SUBCUTANEOUS
  Administered 2023-09-12 (×2): 3 [IU] via SUBCUTANEOUS
  Administered 2023-09-13: 5 [IU] via SUBCUTANEOUS
  Administered 2023-09-13: 3 [IU] via SUBCUTANEOUS
  Administered 2023-09-14: 2 [IU] via SUBCUTANEOUS
  Administered 2023-09-14: 3 [IU] via SUBCUTANEOUS
  Filled 2023-09-06 (×16): qty 1

## 2023-09-06 MED ORDER — TRAZODONE HCL 50 MG PO TABS
25.0000 mg | ORAL_TABLET | Freq: Every evening | ORAL | Status: DC | PRN
  Administered 2023-09-06 – 2023-09-11 (×6): 25 mg via ORAL
  Filled 2023-09-06 (×6): qty 1

## 2023-09-06 MED ORDER — LACTATED RINGERS IV BOLUS (SEPSIS)
1000.0000 mL | Freq: Once | INTRAVENOUS | Status: AC
  Administered 2023-09-06: 1000 mL via INTRAVENOUS

## 2023-09-06 MED ORDER — SODIUM CHLORIDE 0.9 % IV SOLN
2.0000 g | INTRAVENOUS | Status: DC
  Administered 2023-09-07 – 2023-09-08 (×2): 2 g via INTRAVENOUS
  Filled 2023-09-06 (×3): qty 20

## 2023-09-06 MED ORDER — ONDANSETRON HCL 4 MG PO TABS
4.0000 mg | ORAL_TABLET | Freq: Four times a day (QID) | ORAL | Status: DC | PRN
  Administered 2023-09-13: 4 mg via ORAL
  Filled 2023-09-06: qty 1

## 2023-09-06 MED ORDER — INSULIN ASPART 100 UNIT/ML IJ SOLN
0.0000 [IU] | Freq: Every day | INTRAMUSCULAR | Status: DC
  Administered 2023-09-09 – 2023-09-12 (×2): 2 [IU] via SUBCUTANEOUS
  Filled 2023-09-06 (×2): qty 1

## 2023-09-06 MED ORDER — SODIUM CHLORIDE 0.9 % IV SOLN
2.0000 g | Freq: Once | INTRAVENOUS | Status: AC
  Administered 2023-09-06: 2 g via INTRAVENOUS
  Filled 2023-09-06: qty 12.5

## 2023-09-06 MED ORDER — MAGNESIUM HYDROXIDE 400 MG/5ML PO SUSP
30.0000 mL | Freq: Every day | ORAL | Status: DC | PRN
  Administered 2023-09-10 – 2023-09-12 (×2): 30 mL via ORAL
  Filled 2023-09-06 (×2): qty 30

## 2023-09-06 MED ORDER — ONDANSETRON HCL 4 MG/2ML IJ SOLN
4.0000 mg | Freq: Four times a day (QID) | INTRAMUSCULAR | Status: DC | PRN
  Administered 2023-09-07 – 2023-09-11 (×6): 4 mg via INTRAVENOUS
  Filled 2023-09-06 (×6): qty 2

## 2023-09-06 MED ORDER — LACTATED RINGERS IV SOLN: 150.0000 mL/h | INTRAVENOUS | Status: DC

## 2023-09-06 MED ORDER — LACTATED RINGERS IV BOLUS
1000.0000 mL | Freq: Once | INTRAVENOUS | Status: AC
  Administered 2023-09-06: 1000 mL via INTRAVENOUS

## 2023-09-06 MED ORDER — METRONIDAZOLE 500 MG/100ML IV SOLN
500.0000 mg | Freq: Once | INTRAVENOUS | Status: AC
  Administered 2023-09-06: 500 mg via INTRAVENOUS
  Filled 2023-09-06: qty 100

## 2023-09-06 MED ORDER — ACETAMINOPHEN 650 MG RE SUPP: 650.0000 mg | Freq: Four times a day (QID) | RECTAL | Status: DC | PRN

## 2023-09-06 MED ORDER — ACETAMINOPHEN 325 MG PO TABS
650.0000 mg | ORAL_TABLET | Freq: Four times a day (QID) | ORAL | Status: DC | PRN
  Administered 2023-09-06 – 2023-09-13 (×10): 650 mg via ORAL
  Filled 2023-09-06 (×10): qty 2

## 2023-09-06 MED ORDER — METRONIDAZOLE 500 MG/100ML IV SOLN
500.0000 mg | Freq: Two times a day (BID) | INTRAVENOUS | Status: DC
  Administered 2023-09-07 – 2023-09-09 (×5): 500 mg via INTRAVENOUS
  Filled 2023-09-06 (×6): qty 100

## 2023-09-06 MED ORDER — ENOXAPARIN SODIUM 60 MG/0.6ML IJ SOSY
0.5000 mg/kg | PREFILLED_SYRINGE | INTRAMUSCULAR | Status: DC
  Administered 2023-09-06 – 2023-09-13 (×8): 60 mg via SUBCUTANEOUS
  Filled 2023-09-06 (×8): qty 0.6

## 2023-09-06 MED ORDER — IOHEXOL 300 MG/ML  SOLN
75.0000 mL | Freq: Once | INTRAMUSCULAR | Status: AC | PRN
  Administered 2023-09-06: 75 mL via INTRAVENOUS

## 2023-09-06 MED ORDER — FENTANYL CITRATE PF 50 MCG/ML IJ SOSY
50.0000 ug | PREFILLED_SYRINGE | Freq: Once | INTRAMUSCULAR | Status: AC
  Administered 2023-09-06: 50 ug via INTRAVENOUS
  Filled 2023-09-06: qty 1

## 2023-09-06 NOTE — Assessment & Plan Note (Signed)
-   We will continue antihypertensive therapy while holding off nephrotoxins.

## 2023-09-06 NOTE — ED Notes (Signed)
 Called lab again to add trop to previously drawn labs

## 2023-09-06 NOTE — Assessment & Plan Note (Signed)
 -  We will continue statin therapy.

## 2023-09-06 NOTE — ED Notes (Signed)
 Through out triage, patient states, "Everything is getting dark and spinning."  Patient also clammy, feels as if he is going to pass out.

## 2023-09-06 NOTE — Assessment & Plan Note (Signed)
-   We will continue as needed colchicine.

## 2023-09-06 NOTE — Sepsis Progress Note (Signed)
 Sepsis protocol is being followed by [facility].

## 2023-09-06 NOTE — Progress Notes (Signed)
 CODE SEPSIS - PHARMACY COMMUNICATION  **Broad Spectrum Antibiotics should be administered within 1 hour of Sepsis diagnosis**  Time Code Sepsis Called/Page Received: 1552  Antibiotics Ordered: cefepime and metronidazole  Time of 1st antibiotic administration: 1558  Additional action taken by pharmacy: None  If necessary, Name of Provider/Nurse Contacted: None    Rockwell Alexandria ,PharmD Clinical Pharmacist  09/06/2023  3:52 PM

## 2023-09-06 NOTE — Assessment & Plan Note (Addendum)
-   The patient will be admitted to a medical telemetry bed. - Sepsis manifested by leukocytosis and tachypnea. - We will continue IV therapy with Rocephin and Flagyl. - Pain management will be provided. - General Surgery consultation will be obtained perianal suspected abscess though CT is not showing significant fluid collection. - I notified Dr. Aleen Campi about the patient.

## 2023-09-06 NOTE — ED Provider Notes (Signed)
 [facility] Provider Note    Event Date/Time   First MD Initiated Contact with Patient 09/06/23 1512     (approximate)   History   Near Syncope   HPI  Devin White is a 68 y.o. male past medical history significant for diabetes, history of CVA, prior liver transplant on immunosuppressants, CHF, BPH, hypertension, presents to the emergency department after feeling like he was about to pass out.  States that he felt like he was going to pass out and had a syncopal episode approximately 2 weeks ago.  States that since that time is not been feeling well with some intermittent generalized weakness and fatigue.  Last week had an episode of significant diarrhea for approximately 17 episodes.  Denies any blood in his stool or melena.  States today he started having worsening abdominal pain that is worse on the right side.  Noted a small area near his rectum that has significantly increased in size over the past couple of days.  Patient states that he had another needed near syncopal episode today.  Denies any chest pain or shortness of breath.  Does state that he did not hit his head but complaining of significant headache.  Denies any change in vision.  Denies any ongoing diarrhea.  Does endorse some burning with urination and decreased urine output.  He feels very dehydrated.  Does state that he had a blood clot in his abdomen in the past but is not on anticoagulation.     Physical Exam   Triage Vital Signs: ED Triage Vitals  Encounter Vitals Group     BP 09/06/23 1423 (!) 133/102     Systolic BP Percentile --      Diastolic BP Percentile --      Pulse Rate 09/06/23 1423 (!) 114     Resp 09/06/23 1423 20     Temp 09/06/23 1423 (!) 97.5 F (36.4 C)     Temp Source 09/06/23 1423 Oral     SpO2 09/06/23 1423 94 %     Weight 09/06/23 1425 260 lb (117.9 kg)     Height 09/06/23 1425 6\' 1"  (1.854 m)     Head Circumference --      Peak Flow --      Pain Score  09/06/23 1425 10     Pain Loc --      Pain Education --      Exclude from Growth Chart --     Most recent vital signs: Vitals:   09/06/23 1730 09/06/23 1830  BP: 114/68 103/66  Pulse: 96 (!) 108  Resp: 20 19  Temp:    SpO2: 95% 95%    Physical Exam Exam conducted with a chaperone present.  Constitutional:      Appearance: He is well-developed.  HENT:     Head: Atraumatic.     Mouth/Throat:     Mouth: Mucous membranes are dry.  Eyes:     Conjunctiva/sclera: Conjunctivae normal.  Cardiovascular:     Rate and Rhythm: Regular rhythm. Tachycardia present.     Heart sounds: No murmur heard. Pulmonary:     Effort: No respiratory distress.  Abdominal:     Tenderness: There is abdominal tenderness.     Comments: Right lower quadrant abdominal tenderness to palpation with no rebound or guarding  Genitourinary:    Comments: Rectal exam with palpable area of fluctuance and induration to the left perianal region.  Did have tenderness to palpation and fluctuance with  digital rectal exam.  No gross blood or melena. Musculoskeletal:     Cervical back: Normal range of motion.  Skin:    General: Skin is warm.     Capillary Refill: Capillary refill takes 2 to 3 seconds.     Findings: Bruising present.     Comments: Ecchymosis that appears old to upper extremities.  Neurological:     Mental Status: He is alert and oriented to person, place, and time. Mental status is at baseline.  Psychiatric:        Mood and Affect: Mood normal.     IMPRESSION / MDM / ASSESSMENT AND PLAN / ED COURSE  I reviewed the triage vital signs and the nursing notes.  In chart review patient had a history of liver transplant at [facility] in 2009 and is on immunosuppressive medications.  Does have an allergy to Levaquin.  On arrival patient meets criteria for sepsis with hypothermia and tachycardia.  Concern for infectious process intra-abdominal -possible abscess, diverticulitis, appendicitis, pyelonephritis,  malignancy.  Lower suspicion for ACS as a cause of his syncope or dysrhythmia.  Low suspicion for pulmonary embolism, no chest pain or shortness of breath.  Felt that 30 cc/kg of IV fluids may be detrimental given his history of CHF, will give 1 L of IV fluids and reevaluate.  Started on antibiotics to cover for intra-abdominal process.  Given IV pain control.  Made NPO.   EKG  I, Corena Herter, the attending physician, personally viewed and interpreted this ECG.  Sinus tachycardia with a heart rate of 112.  Normal intervals.  No chamber enlargement.  Increased rate when compared to prior EKG.  No findings of significant ST elevation or depression.  No findings of acute ischemia.  Sinus tachycardia on cardiac telemetry RADIOLOGY I independently reviewed imaging, my interpretation of imaging: CT head no acute intracranial hemorrhage.  Read as no acute findings.  CT chest abdomen and pelvis with contrast -read as no acute abnormalities.  Possible tiny perianal foci of gas with no fluid collection.  Nonobstructive left nephrolithiasis.  Diverticulosis with no acute diverticulitis.  LABS (all labs ordered are listed, but only abnormal results are displayed) Labs interpreted as -    Labs Reviewed  BASIC METABOLIC PANEL - Abnormal; Notable for the following components:      Result Value   Sodium 134 (*)    CO2 20 (*)    Glucose, Bld 172 (*)    BUN 37 (*)    Creatinine, Ser 2.67 (*)    GFR, Estimated 26 (*)    All other components within normal limits  CBC - Abnormal; Notable for the following components:   WBC 26.8 (*)    All other components within normal limits  URINALYSIS, W/ REFLEX TO CULTURE (INFECTION SUSPECTED) - Abnormal; Notable for the following components:   Color, Urine YELLOW (*)    APPearance CLEAR (*)    All other components within normal limits  HEPATIC FUNCTION PANEL - Abnormal; Notable for the following components:   Alkaline Phosphatase 37 (*)    Total Bilirubin  2.7 (*)    Bilirubin, Direct 0.6 (*)    Indirect Bilirubin 2.1 (*)    All other components within normal limits  CBG MONITORING, ED - Abnormal; Notable for the following components:   Glucose-Capillary 173 (*)    All other components within normal limits  TROPONIN I (HIGH SENSITIVITY) - Abnormal; Notable for the following components:   Troponin I (High Sensitivity) 23 (*)  All other components within normal limits  TROPONIN I (HIGH SENSITIVITY) - Abnormal; Notable for the following components:   Troponin I (High Sensitivity) 21 (*)    All other components within normal limits  RESP PANEL BY RT-PCR (RSV, FLU A&B, COVID)  RVPGX2  CULTURE, BLOOD (ROUTINE X 2)  CULTURE, BLOOD (ROUTINE X 2)  LACTIC ACID, PLASMA  LIPASE, BLOOD  LACTIC ACID, PLASMA     MDM  Found to have a white count of 26.8.  Acute kidney injury with creatinine elevated up to 2.6 from a baseline of 1.6.  Appears prerenal with elevation in BUN.  CO2 mildly low at 20.  Patient does have a GFR of 26.  Discussed risk and benefits of obtaining a contrasted scan given acute kidney injury.  Discussed with the patient concern of diagnosing an intra-abdominal abscess and the need for IV contrast dye.  He understands the risk and benefits of worsening kidney injury possibility with contrast dye.  Given significant concern for intra-abdominal abscess or acute pathology we will obtain contrasted scan.  Discussed with radiology Dr. Raynald Kemp and nephrology Dr. Suezanne Jacquet.   No findings of urinary tract infection.  No obvious source of an infectious process.  Received antibiotics to cover for intra-abdominal pathology.  Questionable small early perianal abscess but no obvious drainable fluid collection.  Patient will require admission for acute kidney injury and syncope.  Consulted hospitalist for admission.     PROCEDURES:  Critical Care performed: yes  .Critical Care  Performed by: Corena Herter, MD Authorized by: Corena Herter, MD    Critical care provider statement:    Critical care time (minutes):  30   Critical care time was exclusive of:  Separately billable procedures and treating other patients   Critical care was necessary to treat or prevent imminent or life-threatening deterioration of the following conditions:  Sepsis   Critical care was time spent personally by me on the following activities:  Development of treatment plan with patient or surrogate, discussions with consultants, evaluation of patient's response to treatment, examination of patient, ordering and review of laboratory studies, ordering and review of radiographic studies, ordering and performing treatments and interventions, pulse oximetry, re-evaluation of patient's condition and review of old charts   Care discussed with: admitting provider     Patient's presentation is most consistent with acute presentation with potential threat to life or bodily function.   MEDICATIONS ORDERED IN ED: Medications  lactated ringers infusion ( Intravenous New Bag/Given 09/06/23 1824)  lactated ringers bolus 1,000 mL (0 mLs Intravenous Stopped 09/06/23 1818)  ceFEPIme (MAXIPIME) 2 g in sodium chloride 0.9 % 100 mL IVPB (0 g Intravenous Stopped 09/06/23 1628)  metroNIDAZOLE (FLAGYL) IVPB 500 mg (0 mg Intravenous Stopped 09/06/23 1818)  fentaNYL (SUBLIMAZE) injection 50 mcg (50 mcg Intravenous Given 09/06/23 1617)  ondansetron (ZOFRAN) injection 4 mg (4 mg Intravenous Given 09/06/23 1617)  iohexol (OMNIPAQUE) 300 MG/ML solution 75 mL (75 mLs Intravenous Contrast Given 09/06/23 1649)  lactated ringers bolus 1,000 mL (1,000 mLs Intravenous New Bag/Given 09/06/23 1907)    FINAL CLINICAL IMPRESSION(S) / ED DIAGNOSES   Final diagnoses:  AKI (acute kidney injury) (HCC)  Syncope, unspecified syncope type     Rx / DC Orders   ED Discharge Orders     None        Note:  This document was prepared using Dragon voice recognition software and may include unintentional  dictation errors.   Corena Herter, MD 09/06/23 [id number]

## 2023-09-06 NOTE — H&P (Signed)
 [place]   PATIENT NAME: Devin White    MR#:  [id number]  DATE OF BIRTH:  01/29/1956  DATE OF ADMISSION:  09/06/2023  PRIMARY CARE PHYSICIAN: Karie Schwalbe, MD   Patient is coming from: Home  REQUESTING/REFERRING PHYSICIAN: Corena Herter, MD  CHIEF COMPLAINT:   Chief Complaint  Patient presents with   Near Syncope    HISTORY OF PRESENT ILLNESS:  Devin White is a 68 y.o. male with medical history significant for liver cirrhosis and hepatic cell failure status post liver transplant in 2009 on immunosuppressive therapy, CHF, GERD, BPH, essential hypertension, CVA, stage IIIa chronic kidney disease, gout, and osteoarthritis, who presented to the emergency room with acute onset of presyncope.  He stated that he has been feeling sick for couple weeks.  He did have a syncopal episode about a couple weeks ago.  He has not been feeling well since then generalized weakness and fatigue intermittently.  Last week he had recurrent diarrhea for about 17 episodes.  He denies any melena or bright red bleeding per rectum.  Started having worsening abdominal pain today more on the right side and noticed a small area near his rectum that has significant increased in size over the last couple of days.  He admitted to tactile fever and chills.  He has been having dry cough with no dyspnea or wheezing.  No chest pain or palpitations.  No nausea or vomiting.  Denies any trauma or injuries but was having headache.  He has mild dysuria without hematuria urgency or frequency or flank pain.  ED Course: When the patient came to the ER, temperature was 97.5 BP 133/102 heart rate 114 and pulse ox was 94% on room air.  Labs revealed mild hyponatremia 134 and CO2 of 20 with a blood glucose of 172 with a BUN of 37 creatinine 2.67 above previous levels.  Total bili was 2.7 with indirect bili of 2.1 and direct bili of 0.6.  High sensitive troponin I was 23 and later 21.  Lactic acid was 1.8.  CBC showed  leukocytosis 26.8.  Respiratory panel came back negative.  UA came back negative. EKG as reviewed by me : EKG showed normal sinus rhythm with EKG showed sinus tachycardia with a rate of 106 deviation voltage QRS and inferior Q waves. Imaging: CT of the chest, abdomen and pelvis with contrast revealed the following: 1. No acute intrathoracic, intra-abdominal, intrapelvic abnormality. 2. Possible tiny perianal foci of gas with no organized fluid collection. Please note the inferior-most portion of the gluteal clefts and perineal regions are not visualized due to collimation off view. 3. Nonobstructive punctate left nephrolithiasis. 4. Colonic diverticulosis with no acute diverticulitis. 5. No excretion of intravenous contrast from the kidneys on delayed view. Recommend correlation with renal function tests. 6.  Aortic Atherosclerosis.  Noncontrasted head CT scan revealed the following: No acute intracranial normalities.  The patient was given 2 g of IV cefepime, 500 mg of IV Flagyl and 1 L bolus of IV lactated Ringer.  He will be admitted to a medical telemetry bed for further evaluation and management. PAST MEDICAL HISTORY:   Past Medical History:  Diagnosis Date   Arthritis    back and legs   Benign prostatic hypertrophy    CHF (congestive heart failure) (HCC)    GERD (gastroesophageal reflux disease)    Gout    History of blood transfusion    pt has antibodies in his blood since previous transfusions  History of cirrhosis of liver S/P TRANSPLANT 2009   History of liver failure S/P TRANSPLANT   Hypertension    Left ureteral calculus    Renal disorder    Stroke [facility])     PAST SURGICAL HISTORY:   Past Surgical History:  Procedure Laterality Date   APPENDECTOMY     bone morrow biopsy     CHOLECYSTECTOMY  2007   CYSTO/ LEFT RETROGRADE PYELOGRAM/ LEFT URETERAL STENT PLACEMENT  03-05-2012  DR Berneice Heinrich [facility])   LEFT URETERAL CALCULI   CYSTOSCOPY W/ URETERAL STENT PLACEMENT   03/11/2012   Procedure: CYSTOSCOPY WITH STENT REPLACEMENT;  Surgeon: Sebastian Ache, MD;  Location: [facility];  Service: Urology;  Laterality: Left;   HERNIA REPAIR  2008   LIVER BIOPSY     LIVER TRANSPLANT  11/24/2007   pt states doing well since liver transplant   LUMBAR DISC SURGERY     LUMBAR FUSION     removal of fistula     URETEROSCOPY  03/11/2012   Procedure: URETEROSCOPY;  Surgeon: Sebastian Ache, MD;  Location: [facility];  Service: Urology;  Laterality: Left;   STONE MANIPULATION, stone obtained  [phone] [employer] MCR    SOCIAL HISTORY:   Social History   Tobacco Use   Smoking status: Never    Passive exposure: Past   Smokeless tobacco: Never  Substance Use Topics   Alcohol use: No    FAMILY HISTORY:   Family History  Problem Relation Age of Onset   Cancer Mother        colon   Arthritis Mother    Vasculitis Mother    Hypertension Mother    Cancer Father        colon   Kidney disease Father    Arthritis Father    Arthritis Brother    Alcohol abuse Maternal Aunt    Diabetes Maternal Aunt    Alcohol abuse Maternal Uncle    Diabetes Paternal Aunt    Alcohol abuse Maternal Uncle    Alcohol abuse Maternal Uncle     DRUG ALLERGIES:   Allergies  Allergen Reactions   Levaquin [Levofloxacin] Shortness Of Breath    SOB and severe leg pain/weakness    REVIEW OF SYSTEMS:   ROS As per history of present illness. All pertinent systems were reviewed above. Constitutional, HEENT, cardiovascular, respiratory, GI, GU, musculoskeletal, neuro, psychiatric, endocrine, integumentary and hematologic systems were reviewed and are otherwise negative/unremarkable except for positive findings mentioned above in the HPI.   MEDICATIONS AT HOME:   Prior to Admission medications   Medication Sig Start Date End Date Taking? Authorizing Provider  acetaminophen (TYLENOL) 325 MG tablet Take 650 mg by mouth every 6 (six) hours as needed for mild  pain.    [provider]  amLODipine (NORVASC) 10 MG tablet Take 1 tablet (10 mg total) by mouth daily. 02/14/23   Karie Schwalbe, MD  aspirin EC 81 MG tablet Take 1 tablet (81 mg total) by mouth daily. Swallow whole. 11/12/22   Alford Highland, MD  Blood Glucose Monitoring Suppl DEVI 1 each by Does not apply route in the morning, at noon, and at bedtime. May substitute to any manufacturer covered by patient's insurance. 04/21/23   Enedina Finner, MD  calcium carbonate (TUMS) 500 MG chewable tablet Chew 1 tablet (200 mg of elemental calcium total) by mouth 2 (two) times daily as needed for indigestion or heartburn. 06/26/16   Joaquim Nam, MD  colchicine 0.6 MG  tablet Take 1 tablet (0.6 mg total) by mouth daily as needed. 05/01/23   Karie Schwalbe, MD  Continuous Glucose Sensor (FREESTYLE LIBRE 3 PLUS SENSOR) MISC Change sensor every 15 days. 05/01/23   Karie Schwalbe, MD  diclofenac Sodium (VOLTAREN) 1 % GEL APPLY 4 GRAMS TOPICALLY TO THE AFFECTED AREA FOUR TIMES DAILY 08/25/23   Karie Schwalbe, MD  feeding supplement, GLUCERNA SHAKE, (GLUCERNA SHAKE) LIQD Take 237 mLs by mouth 2 (two) times daily between meals. 04/21/23   Enedina Finner, MD  furosemide (LASIX) 40 MG tablet TAKE 1 TABLET(40 MG) BY MOUTH DAILY 07/29/23   Karie Schwalbe, MD  gabapentin (NEURONTIN) 300 MG capsule Take 1 capsule (300 mg total) by mouth 3 (three) times daily. 05/01/23   Karie Schwalbe, MD  glipiZIDE (GLUCOTROL XL) 2.5 MG 24 hr tablet TAKE 1 TABLET(2.5 MG) BY MOUTH DAILY WITH BREAKFAST 06/13/23   Tillman Abide I, MD  insulin glargine (LANTUS) 100 UNIT/ML Solostar Pen Inject 30 Units into the skin daily. 05/01/23   Karie Schwalbe, MD  insulin lispro (HUMALOG KWIKPEN) 100 UNIT/ML KwikPen 0-15 Units, Subcutaneous, 3 times daily before meals & bedtime  CBG 70 - 120: 0 units  CBG 121 - 150: 2 units  CBG 151 - 200: 3 units  CBG 201 - 250: 5 units  CBG 251 - 300: 8 units  CBG 301 - 350: 11 units  CBG  351 - 400: 15 units  CBG > 400: call MD 04/21/23   Enedina Finner, MD  Insulin Pen Needle 32G X 4 MM MISC 1 each by Does not apply route as directed. 06/13/23   Karie Schwalbe, MD  lisinopril (ZESTRIL) 40 MG tablet Take 0.5 tablets (20 mg total) by mouth daily. 04/21/23   Enedina Finner, MD  methocarbamol (ROBAXIN) 500 MG tablet Take by mouth 2 (two) times daily. 01/03/23   [provider]  Multiple Vitamin (MULTIVITAMIN WITH MINERALS) TABS tablet Take 1 tablet by mouth daily. 11/07/21   Kathlen Mody, MD  mycophenolate (CELLCEPT) 250 MG capsule Take 250-500 mg by mouth 2 (two) times daily. 2 capsules (500 mg) in the morning and one capsule (250 mg) in the evening 02/04/19   [provider]  nortriptyline (PAMELOR) 10 MG capsule Take 10 mg by mouth 2 (two) times daily.    [provider]  omeprazole (PRILOSEC) 20 MG capsule TAKE 1 CAPSULE(20 MG) BY MOUTH DAILY 01/28/23   Karie Schwalbe, MD  predniSONE (DELTASONE) 10 MG tablet Take 10 mg by mouth daily with breakfast.    [provider]  rosuvastatin (CRESTOR) 20 MG tablet Take 1 tablet (20 mg total) by mouth daily. 12/27/22   Karie Schwalbe, MD  tacrolimus (PROGRAF) 0.5 MG capsule Take 0.5-1 mg by mouth See admin instructions. Taking 0.5 mg in the morning and 1 mg at bedtime    [provider]  tamsulosin (FLOMAX) 0.4 MG CAPS capsule TAKE 1 CAPSULE(0.4 MG) BY MOUTH DAILY 12/02/22   Karie Schwalbe, MD  trimethoprim (TRIMPEX) 100 MG tablet Take 100 mg by mouth daily. 01/05/23   [provider]      VITAL SIGNS:  Blood pressure 130/74, pulse (!) 104, temperature 98.3 F (36.8 C), temperature source Oral, resp. rate 20, height 6\' 1"  (1.854 m), weight 120.6 kg, SpO2 97%.  PHYSICAL EXAMINATION:  Physical Exam  GENERAL:  68 y.o.-year-old male patient lying in the bed with no acute distress.  EYES: Pupils equal, round, reactive to  light and accommodation. No scleral icterus. Extraocular muscles  intact.  HEENT: Head atraumatic, normocephalic. Oropharynx and nasopharynx clear.  NECK:  Supple, no jugular venous distention. No thyroid enlargement, no tenderness.  LUNGS: Normal breath sounds bilaterally, no wheezing, rales,rhonchi or crepitation. No use of accessory muscles of respiration.  CARDIOVASCULAR: Regular rate and rhythm, S1, S2 normal. No murmurs, rubs, or gallops.  ABDOMEN: Soft, nondistended, with right lower quadrant abdominal tenderness without rebound tenderness guarding or rigidity. Bowel sounds present. No organomegaly or mass.  Rectal exam performed by the ER physician revealed the following: palpable area of fluctuance and induration to the left perianal region. Did have tenderness to palpation and fluctuance with digital rectal exam. No gross blood or melena.  EXTREMITIES: No pedal edema, cyanosis, or clubbing.  NEUROLOGIC: Cranial nerves II through XII are intact. Muscle strength 5/5 in all extremities. Sensation intact. Gait not checked.  PSYCHIATRIC: The patient is alert and oriented x 3.  Normal affect and good eye contact. SKIN: No obvious rash, lesion, or ulcer.   LABORATORY PANEL:   CBC Recent Labs  Lab 09/06/23 1428  WBC 26.8*  HGB 15.2  HCT 45.2  PLT 183   ------------------------------------------------------------------------------------------------------------------  Chemistries  Recent Labs  Lab 09/06/23 1428  NA 134*  K 4.8  CL 109  CO2 20*  GLUCOSE 172*  BUN 37*  CREATININE 2.67*  CALCIUM 9.0  AST 23  ALT 22  ALKPHOS 37*  BILITOT 2.7*   ------------------------------------------------------------------------------------------------------------------  Cardiac Enzymes No results for input(s): "TROPONINI" in the last 168 hours. ------------------------------------------------------------------------------------------------------------------  RADIOLOGY:  CT CHEST ABDOMEN PELVIS W CONTRAST Result Date: 09/06/2023 CLINICAL DATA:   Sepsis lower abd pain, sepsis, RLQ pain, concern for perirectal abscess EXAM: CT CHEST, ABDOMEN, AND PELVIS WITH CONTRAST TECHNIQUE: Multidetector CT imaging of the chest, abdomen and pelvis was performed following the standard protocol during bolus administration of intravenous contrast. RADIATION DOSE REDUCTION: This exam was performed according to the departmental dose-optimization program which includes automated exposure control, adjustment of the mA and/or kV according to patient size and/or use of iterative reconstruction technique. CONTRAST:  75mL OMNIPAQUE IOHEXOL 300 MG/ML  SOLN COMPARISON:  None Available. FINDINGS: CT CHEST FINDINGS Cardiovascular: Normal heart size. No significant pericardial effusion. The thoracic aorta is normal in caliber. Mild atherosclerotic plaque of the thoracic aorta. Left anterior descending coronary artery calcifications versus possible stent-limited evaluation due to motion artifact. Right main coronary artery calcification. Mediastinum/Nodes: No enlarged mediastinal, hilar, or axillary lymph nodes. Thyroid gland, trachea, and esophagus demonstrate no significant findings. Lungs/Pleura: Limited evaluation due to motion artifact. No focal consolidation. No pulmonary nodule. No pulmonary mass. No pleural effusion. No pneumothorax. Musculoskeletal: No chest wall abnormality. No suspicious lytic or blastic osseous lesions. No acute displaced fracture. CT ABDOMEN PELVIS FINDINGS Hepatobiliary: No focal liver abnormality. Status post cholecystectomy. No biliary dilatation. Pancreas: Diffusely atrophic. No focal lesion. Otherwise normal pancreatic contour. No surrounding inflammatory changes. No main pancreatic ductal dilatation. Spleen: Normal in size without focal abnormality. Adrenals/Urinary Tract: No adrenal nodule bilaterally. Bilateral renal cortical scarring. Bilateral kidneys enhance symmetrically. Fluid density lesions likely represent simple renal cysts. Simple renal  cysts, in the absence of clinically indicated signs/symptoms, require no independent follow-up. Subcentimeter hypodensity too small to characterize-no further follow-up indicated. No hydronephrosis. No hydroureter. Punctate left nephrolithiasis. No right nephrolithiasis. No ureterolithiasis bilaterally. The urinary bladder is unremarkable. No excretion of intravenous contrast from the kidneys on delayed view. Stomach/Bowel: Stomach is within normal limits. No evidence of bowel wall thickening or  dilatation. Colonic diverticulosis. Possible tiny perianal foci of gas with no organized fluid collection. Please note the inferior-most portion of the gluteal clefts and perineal regions are not visualized due to collimation off view. Status post appendectomy. Vascular/Lymphatic: No abdominal aorta or iliac aneurysm. Moderate atherosclerotic plaque of the aorta and its branches. No abdominal, pelvic, or inguinal lymphadenopathy. Reproductive: Prostate is unremarkable. Other: No intraperitoneal free fluid. No intraperitoneal free gas. No organized fluid collection. Musculoskeletal: Tiny fat containing umbilical hernia. No suspicious lytic or blastic osseous lesions. No acute displaced fracture. L4-S1 posterolateral surgical hardware. No CT evidence of surgical hardware complication. IMPRESSION: 1. No acute intrathoracic, intra-abdominal, intrapelvic abnormality. 2. Possible tiny perianal foci of gas with no organized fluid collection. Please note the inferior-most portion of the gluteal clefts and perineal regions are not visualized due to collimation off view. 3. Nonobstructive punctate left nephrolithiasis. 4. Colonic diverticulosis with no acute diverticulitis. 5. No excretion of intravenous contrast from the kidneys on delayed view. Recommend correlation with renal function tests. 6.  Aortic Atherosclerosis (ICD10-I70.0). Electronically Signed   By: Tish Frederickson M.D.   On: 09/06/2023 17:30   CT Head Wo  Contrast Result Date: 09/06/2023 CLINICAL DATA:  Headache, new onset (Age >= 51y) EXAM: CT HEAD WITHOUT CONTRAST TECHNIQUE: Contiguous axial images were obtained from the base of the skull through the vertex without intravenous contrast. RADIATION DOSE REDUCTION: This exam was performed according to the departmental dose-optimization program which includes automated exposure control, adjustment of the mA and/or kV according to patient size and/or use of iterative reconstruction technique. COMPARISON:  CT head 04/18/2023 FINDINGS: Brain: Patchy and confluent areas of decreased attenuation are noted throughout the deep and periventricular white matter of the cerebral hemispheres bilaterally, compatible with chronic microvascular ischemic disease. No evidence of large-territorial acute infarction. No parenchymal hemorrhage. No mass lesion. No extra-axial collection. No mass effect or midline shift. No hydrocephalus. Basilar cisterns are patent. Vascular: No hyperdense vessel. Atherosclerotic calcifications are present within the cavernous internal carotid and vertebral arteries. Skull: No acute fracture or focal lesion. Sinuses/Orbits: Right sphenoid sinus mucosal thickening. Paranasal sinuses and mastoid air cells are clear. The orbits are unremarkable. Other: None. IMPRESSION: No acute intracranial abnormality. Electronically Signed   By: Tish Frederickson M.D.   On: 09/06/2023 17:21      IMPRESSION AND PLAN:  Assessment and Plan: * Sepsis due to undetermined organism [facility]) - The patient will be admitted to a medical telemetry bed. - Sepsis manifested by leukocytosis and tachypnea. - We will continue IV therapy with Rocephin and Flagyl. - Pain management will be provided. - General Surgery consultation will be obtained perianal suspected abscess though CT is not showing significant fluid collection. - I notified Dr. Aleen Campi about the patient.  Near syncope - This is likely secondary to #1. - The  patient will be hydrated with IV lactated ringer. - We will follow orthostatics. - He will be monitored for arrhythmias.  Acute kidney injury superimposed on chronic kidney disease (HCC) - The patient be placed on hydration with IV lactated ringer. - We will avoid nephrotoxins. - Will follow BMPs.  Type 2 diabetes mellitus with peripheral neuropathy (HCC) - The will be placed on supplemental coverage with NovoLog. - We will continue basal coverage. - We will continue glipizide ER. - We will continue Neurontin.  Essential hypertension - We will continue antihypertensive therapy while holding off nephrotoxins.  Dyslipidemia - We will continue statin therapy.  Gout - We will continue as needed colchicine.  DVT prophylaxis: Lovenox.  Advanced Care Planning:  Code Status: full code.  Family Communication:  The plan of care was discussed in details with the patient (and family). I answered all questions. The patient agreed to proceed with the above mentioned plan. Further management will depend upon hospital course. Disposition Plan: Back to previous home environment Consults called: none.  All the records are reviewed and case discussed with ED provider.  Status is: Inpatient  At the time of the admission, it appears that the appropriate admission status for this patient is inpatient.  This is judged to be reasonable and necessary in order to provide the required intensity of service to ensure the patient's safety given the presenting symptoms, physical exam findings and initial radiographic and laboratory data in the context of comorbid conditions.  The patient requires inpatient status due to high intensity of service, high risk of further deterioration and high frequency of surveillance required.  I certify that at the time of admission, it is my clinical judgment that the patient will require inpatient hospital care extending more than 2 midnights.                             Dispo: The patient is from: Home              Anticipated d/c is to: Home              Patient currently is not medically stable to d/c.              Difficult to place patient: No  Hannah Beat M.D on 09/06/2023 at 11:01 PM  [facility]   From 7 PM-7 AM, contact night-coverage [email or web address]  CC: Primary care physician; Karie Schwalbe, MD

## 2023-09-06 NOTE — Progress Notes (Signed)
 68 year old male with history of diabetes, peripheral vascular disease, history of CVA, hypertension, history of prior liver transplant, congestive heart failure, now being admitted with history of generalized weakness and abdominal pain.  As per the ED attending the patient has a creatinine of 2.67 with a GFR of 26 cc/min.  Patient needs CT scan of the abdomen and pelvis with IV contrast. I have explained to the ED attending that the patient is is at moderate to high risk for contrast nephropathy.  She said she discussed with the patient and patient is willing to take the risk of acute kidney injury. Patient will be monitored closely.  He was started on IV fluids with Ringer's lactate. Patient also started on IV antibiotics.

## 2023-09-06 NOTE — Assessment & Plan Note (Signed)
-   The will be placed on supplemental coverage with NovoLog. - We will continue basal coverage. - We will continue glipizide ER. - We will continue Neurontin.

## 2023-09-06 NOTE — Assessment & Plan Note (Signed)
-   The patient be placed on hydration with IV lactated ringer. - We will avoid nephrotoxins. - Will follow BMPs.

## 2023-09-06 NOTE — ED Triage Notes (Signed)
 Pt in via POV, reports being sick last 2 weeks, states he has been "sick on my stomach."  Reports intermittent diarrhea x 2 weeks, now w/ nausea.  Reports one syncopal episode last week, and near syncopal episode today.  Also endorses rectal pain x 3-4 days, states, "It started off as a small lump, but now its big.  Patient is uncomfortable sitting due to pain.

## 2023-09-06 NOTE — Assessment & Plan Note (Signed)
-   This is likely secondary to #1. - The patient will be hydrated with IV lactated ringer. - We will follow orthostatics. - He will be monitored for arrhythmias.

## 2023-09-06 NOTE — ED Notes (Signed)
 Pt gone to CT. Called lab to add on trop to previous blood work.

## 2023-09-07 DIAGNOSIS — K6289 Other specified diseases of anus and rectum: Secondary | ICD-10-CM | POA: Diagnosis not present

## 2023-09-07 DIAGNOSIS — K610 Anal abscess: Secondary | ICD-10-CM | POA: Diagnosis present

## 2023-09-07 DIAGNOSIS — A419 Sepsis, unspecified organism: Secondary | ICD-10-CM | POA: Diagnosis not present

## 2023-09-07 DIAGNOSIS — R197 Diarrhea, unspecified: Secondary | ICD-10-CM | POA: Diagnosis not present

## 2023-09-07 DIAGNOSIS — E860 Dehydration: Secondary | ICD-10-CM | POA: Diagnosis not present

## 2023-09-07 LAB — CBC
HCT: 38 % — ABNORMAL LOW (ref 39.0–52.0)
Hemoglobin: 12.6 g/dL — ABNORMAL LOW (ref 13.0–17.0)
MCH: 31.6 pg (ref 26.0–34.0)
MCHC: 33.2 g/dL (ref 30.0–36.0)
MCV: 95.2 fL (ref 80.0–100.0)
Platelets: 111 10*3/uL — ABNORMAL LOW (ref 150–400)
RBC: 3.99 MIL/uL — ABNORMAL LOW (ref 4.22–5.81)
RDW: 14.8 % (ref 11.5–15.5)
WBC: 17.1 10*3/uL — ABNORMAL HIGH (ref 4.0–10.5)
nRBC: 0 % (ref 0.0–0.2)

## 2023-09-07 LAB — GLUCOSE, CAPILLARY
Glucose-Capillary: 75 mg/dL (ref 70–99)
Glucose-Capillary: 102 mg/dL — ABNORMAL HIGH (ref 70–99)
Glucose-Capillary: 103 mg/dL — ABNORMAL HIGH (ref 70–99)
Glucose-Capillary: 117 mg/dL — ABNORMAL HIGH (ref 70–99)

## 2023-09-07 LAB — BASIC METABOLIC PANEL
Anion gap: 6 (ref 5–15)
BUN: 35 mg/dL — ABNORMAL HIGH (ref 8–23)
CO2: 24 mmol/L (ref 22–32)
Calcium: 8.5 mg/dL — ABNORMAL LOW (ref 8.9–10.3)
Chloride: 109 mmol/L (ref 98–111)
Creatinine, Ser: 2.28 mg/dL — ABNORMAL HIGH (ref 0.61–1.24)
GFR, Estimated: 31 mL/min — ABNORMAL LOW (ref >60)
Glucose, Bld: 89 mg/dL (ref 70–99)
Potassium: 4.5 mmol/L (ref 3.5–5.1)
Sodium: 139 mmol/L (ref 135–145)

## 2023-09-07 LAB — PROTIME-INR
INR: 1.2 (ref 0.8–1.2)
Prothrombin Time: 15.2 s (ref 11.4–15.2)

## 2023-09-07 LAB — CORTISOL-AM, BLOOD: Cortisol - AM: 7.1 ug/dL (ref 6.7–22.6)

## 2023-09-07 MED ORDER — NORTRIPTYLINE HCL 10 MG PO CAPS
10.0000 mg | ORAL_CAPSULE | Freq: Two times a day (BID) | ORAL | Status: DC
  Administered 2023-09-07 – 2023-09-14 (×15): 10 mg via ORAL
  Filled 2023-09-07 (×16): qty 1

## 2023-09-07 MED ORDER — CALCIUM CARBONATE ANTACID 500 MG PO CHEW: 1.0000 | CHEWABLE_TABLET | Freq: Two times a day (BID) | ORAL | Status: DC | PRN

## 2023-09-07 MED ORDER — MYCOPHENOLATE MOFETIL 250 MG PO CAPS
250.0000 mg | ORAL_CAPSULE | Freq: Every evening | ORAL | Status: DC
  Filled 2023-09-07: qty 1

## 2023-09-07 MED ORDER — ROSUVASTATIN CALCIUM 20 MG PO TABS
20.0000 mg | ORAL_TABLET | Freq: Every day | ORAL | Status: DC
  Administered 2023-09-07 – 2023-09-14 (×8): 20 mg via ORAL
  Filled 2023-09-07 (×8): qty 1

## 2023-09-07 MED ORDER — TACROLIMUS 1 MG PO CAPS
1.0000 mg | ORAL_CAPSULE | Freq: Every day | ORAL | Status: DC
  Administered 2023-09-07 – 2023-09-13 (×7): 1 mg via ORAL
  Filled 2023-09-07 (×7): qty 1

## 2023-09-07 MED ORDER — TAMSULOSIN HCL 0.4 MG PO CAPS
0.4000 mg | ORAL_CAPSULE | Freq: Every day | ORAL | Status: DC
  Administered 2023-09-07 – 2023-09-08 (×2): 0.4 mg via ORAL
  Filled 2023-09-07 (×2): qty 1

## 2023-09-07 MED ORDER — TRIMETHOPRIM 100 MG PO TABS
100.0000 mg | ORAL_TABLET | Freq: Every day | ORAL | Status: DC
  Administered 2023-09-07 – 2023-09-14 (×8): 100 mg via ORAL
  Filled 2023-09-07 (×9): qty 1

## 2023-09-07 MED ORDER — GABAPENTIN 300 MG PO CAPS
300.0000 mg | ORAL_CAPSULE | Freq: Three times a day (TID) | ORAL | Status: DC
  Administered 2023-09-07 – 2023-09-14 (×20): 300 mg via ORAL
  Filled 2023-09-07 (×20): qty 1

## 2023-09-07 MED ORDER — METHOCARBAMOL 500 MG PO TABS
500.0000 mg | ORAL_TABLET | Freq: Two times a day (BID) | ORAL | Status: DC
  Administered 2023-09-07 – 2023-09-14 (×15): 500 mg via ORAL
  Filled 2023-09-07 (×15): qty 1

## 2023-09-07 MED ORDER — LACTATED RINGERS IV SOLN
INTRAVENOUS | Status: DC
  Administered 2023-09-08: 100 mL/h via INTRAVENOUS

## 2023-09-07 MED ORDER — PREDNISONE 10 MG PO TABS
10.0000 mg | ORAL_TABLET | Freq: Every day | ORAL | Status: DC
  Administered 2023-09-08 – 2023-09-14 (×7): 10 mg via ORAL
  Filled 2023-09-07 (×7): qty 1

## 2023-09-07 MED ORDER — TACROLIMUS 0.5 MG PO CAPS
0.5000 mg | ORAL_CAPSULE | Freq: Every morning | ORAL | Status: DC
  Administered 2023-09-08 – 2023-09-14 (×7): 0.5 mg via ORAL
  Filled 2023-09-07 (×9): qty 1

## 2023-09-07 MED ORDER — PANTOPRAZOLE SODIUM 40 MG PO TBEC
40.0000 mg | DELAYED_RELEASE_TABLET | Freq: Every day | ORAL | Status: DC
  Administered 2023-09-08 – 2023-09-14 (×7): 40 mg via ORAL
  Filled 2023-09-07 (×7): qty 1

## 2023-09-07 MED ORDER — ASPIRIN 81 MG PO TBEC
81.0000 mg | DELAYED_RELEASE_TABLET | Freq: Every day | ORAL | Status: DC
  Administered 2023-09-07 – 2023-09-14 (×8): 81 mg via ORAL
  Filled 2023-09-07 (×8): qty 1

## 2023-09-07 MED ORDER — AMLODIPINE BESYLATE 10 MG PO TABS
10.0000 mg | ORAL_TABLET | Freq: Every day | ORAL | Status: DC
  Administered 2023-09-07 – 2023-09-14 (×8): 10 mg via ORAL
  Filled 2023-09-07 (×8): qty 1

## 2023-09-07 MED ORDER — MYCOPHENOLATE MOFETIL 250 MG PO CAPS
500.0000 mg | ORAL_CAPSULE | Freq: Every morning | ORAL | Status: DC
  Administered 2023-09-07: 500 mg via ORAL
  Filled 2023-09-07: qty 2

## 2023-09-07 NOTE — Consult Note (Signed)
 Date of Consultation:  09/07/2023  Requesting Physician:  Valente David, MD  Reason for Consultation:  Perianal pain  History of Present Illness: Devin White is a 68 y.o. male with a history of liver transplant in 2009, CKD, CHF, presenting to the hospital with episodes of syncope.  He reports feeling sick for the last two weeks or so.  He's had multiple episodes of diarrhea, but no nausea or vomiting.  He also reports having abdominal pain in the right lower quadrant and feeling a "bump" near his rectum that started small and has increased in size over the past few days.  It is tender to touch and reports pain when he's sitting down.  He presented to the ED and was noted to be dehydrated with Cr 2.67 from 1.6, elevated WBC of 26.8.  He had a CT of abdomen and pelvis which did not show any bowel ischemia or thickening, without any free air or free fluid.  There was a finding of a small area at the very bottom of the CT scan range in the perianal region concerning for possible foci of gas.  He was admitted to the medical team.  He reports feeling a bit better today.  He could tell he was dehydrated.    Of note, he has a history of C-diff infection in the past.  Past Medical History: Past Medical History:  Diagnosis Date   Arthritis    back and legs   Benign prostatic hypertrophy    CHF (congestive heart failure) (HCC)    GERD (gastroesophageal reflux disease)    Gout    History of blood transfusion    pt has antibodies in his blood since previous transfusions   History of cirrhosis of liver S/P TRANSPLANT 2009   History of liver failure S/P TRANSPLANT   Hypertension    Left ureteral calculus    Renal disorder    Stroke [facility])      Past Surgical History: Past Surgical History:  Procedure Laterality Date   APPENDECTOMY     bone morrow biopsy     CHOLECYSTECTOMY  2007   CYSTO/ LEFT RETROGRADE PYELOGRAM/ LEFT URETERAL STENT PLACEMENT  03-05-2012  DR Berneice Heinrich [facility])   LEFT URETERAL  CALCULI   CYSTOSCOPY W/ URETERAL STENT PLACEMENT  03/11/2012   Procedure: CYSTOSCOPY WITH STENT REPLACEMENT;  Surgeon: Sebastian Ache, MD;  Location: [facility];  Service: Urology;  Laterality: Left;   HERNIA REPAIR  2008   LIVER BIOPSY     LIVER TRANSPLANT  11/24/2007   pt states doing well since liver transplant   LUMBAR DISC SURGERY     LUMBAR FUSION     removal of fistula     URETEROSCOPY  03/11/2012   Procedure: URETEROSCOPY;  Surgeon: Sebastian Ache, MD;  Location: [facility];  Service: Urology;  Laterality: Left;   STONE MANIPULATION, stone obtained  [phone] [facility] MCR    Home Medications: Prior to Admission medications   Medication Sig Start Date End Date Taking? Authorizing Provider  acetaminophen (TYLENOL) 325 MG tablet Take 650 mg by mouth every 6 (six) hours as needed for mild pain.   Yes [provider]  amLODipine (NORVASC) 10 MG tablet Take 1 tablet (10 mg total) by mouth daily. 02/14/23  Yes Tillman Abide I, MD  aspirin EC 81 MG tablet Take 1 tablet (81 mg total) by mouth daily. Swallow whole. 11/12/22  Yes Wieting, Richard, MD  calcium carbonate (TUMS) 500 MG chewable tablet Chew  1 tablet (200 mg of elemental calcium total) by mouth 2 (two) times daily as needed for indigestion or heartburn. 06/26/16  Yes Joaquim Nam, MD  colchicine 0.6 MG tablet Take 1 tablet (0.6 mg total) by mouth daily as needed. 05/01/23  Yes Tillman Abide I, MD  diclofenac Sodium (VOLTAREN) 1 % GEL APPLY 4 GRAMS TOPICALLY TO THE AFFECTED AREA FOUR TIMES DAILY 08/25/23  Yes Tillman Abide I, MD  furosemide (LASIX) 40 MG tablet TAKE 1 TABLET(40 MG) BY MOUTH DAILY 07/29/23  Yes Karie Schwalbe, MD  gabapentin (NEURONTIN) 300 MG capsule Take 1 capsule (300 mg total) by mouth 3 (three) times daily. 05/01/23  Yes Tillman Abide I, MD  glipiZIDE (GLUCOTROL XL) 2.5 MG 24 hr tablet TAKE 1 TABLET(2.5 MG) BY MOUTH DAILY WITH BREAKFAST 06/13/23  Yes Tillman Abide I,  MD  insulin glargine (LANTUS) 100 UNIT/ML Solostar Pen Inject 30 Units into the skin daily. 05/01/23  Yes Karie Schwalbe, MD  insulin lispro (HUMALOG KWIKPEN) 100 UNIT/ML KwikPen 0-15 Units, Subcutaneous, 3 times daily before meals & bedtime  CBG 70 - 120: 0 units  CBG 121 - 150: 2 units  CBG 151 - 200: 3 units  CBG 201 - 250: 5 units  CBG 251 - 300: 8 units  CBG 301 - 350: 11 units  CBG 351 - 400: 15 units  CBG > 400: call MD 04/21/23  Yes Enedina Finner, MD  lisinopril (ZESTRIL) 20 MG tablet Take 20 mg by mouth daily.   Yes [provider]  methocarbamol (ROBAXIN) 500 MG tablet Take by mouth 2 (two) times daily. 01/03/23  Yes [provider]  Multiple Vitamin (MULTIVITAMIN WITH MINERALS) TABS tablet Take 1 tablet by mouth daily. 11/07/21  Yes Kathlen Mody, MD  mycophenolate (CELLCEPT) 250 MG capsule Take 250-500 mg by mouth 2 (two) times daily. 2 capsules (500 mg) in the morning and one capsule (250 mg) in the evening 02/04/19  Yes [provider]  nortriptyline (PAMELOR) 10 MG capsule Take 10 mg by mouth 2 (two) times daily.   Yes [provider]  omeprazole (PRILOSEC) 20 MG capsule TAKE 1 CAPSULE(20 MG) BY MOUTH DAILY 01/28/23  Yes Karie Schwalbe, MD  predniSONE (DELTASONE) 10 MG tablet Take 10 mg by mouth daily with breakfast.   Yes [provider]  rosuvastatin (CRESTOR) 20 MG tablet Take 1 tablet (20 mg total) by mouth daily. 12/27/22  Yes Tillman Abide I, MD  tacrolimus (PROGRAF) 0.5 MG capsule Take 0.5-1 mg by mouth See admin instructions. Taking 0.5 mg in the morning and 1 mg at bedtime   Yes [provider]  tamsulosin (FLOMAX) 0.4 MG CAPS capsule TAKE 1 CAPSULE(0.4 MG) BY MOUTH DAILY 12/02/22  Yes Karie Schwalbe, MD  trimethoprim (TRIMPEX) 100 MG tablet Take 100 mg by mouth daily. 01/05/23  Yes [provider]  Blood Glucose Monitoring Suppl DEVI 1 each by Does not apply route in the morning, at noon, and at bedtime.  May substitute to any manufacturer covered by patient's insurance. 04/21/23   Enedina Finner, MD  Continuous Glucose Sensor (FREESTYLE LIBRE 3 PLUS SENSOR) MISC Change sensor every 15 days. 05/01/23   Karie Schwalbe, MD  feeding supplement, GLUCERNA SHAKE, (GLUCERNA SHAKE) LIQD Take 237 mLs by mouth 2 (two) times daily between meals. 04/21/23   Enedina Finner, MD  Insulin Pen Needle 32G X 4 MM MISC 1 each by Does not apply route as directed. 06/13/23   Tillman Abide  I, MD  lisinopril (ZESTRIL) 40 MG tablet Take 0.5 tablets (20 mg total) by mouth daily. Patient not taking: Reported on 09/07/2023 04/21/23   Enedina Finner, MD    Allergies: Allergies  Allergen Reactions   Levaquin [Levofloxacin] Shortness Of Breath    SOB and severe leg pain/weakness    Social History:  reports that he has never smoked. He has been exposed to tobacco smoke. He has never used smokeless tobacco. He reports that he does not drink alcohol and does not use drugs.   Family History: Family History  Problem Relation Age of Onset   Cancer Mother        colon   Arthritis Mother    Vasculitis Mother    Hypertension Mother    Cancer Father        colon   Kidney disease Father    Arthritis Father    Arthritis Brother    Alcohol abuse Maternal Aunt    Diabetes Maternal Aunt    Alcohol abuse Maternal Uncle    Diabetes Paternal Aunt    Alcohol abuse Maternal Uncle    Alcohol abuse Maternal Uncle     Review of Systems: Review of Systems  Constitutional:  Positive for malaise/fatigue. Negative for chills and fever.  Respiratory:  Negative for shortness of breath.   Cardiovascular:  Negative for chest pain.  Gastrointestinal:  Positive for abdominal pain and diarrhea. Negative for nausea and vomiting.  Genitourinary:  Negative for dysuria.  Musculoskeletal:  Negative for myalgias.    Physical Exam BP 139/71 (BP Location: Right Arm)   Pulse 88   Temp 98.3 F (36.8 C)   Resp 16   Ht 6\' 1"  (1.854 m)   Wt 120.6  kg   SpO2 95%   BMI 35.08 kg/m  CONSTITUTIONAL: No acute distress HEENT:  Normocephalic, atraumatic, extraocular motion intact. NECK: Trachea is midline, and there is no jugular venous distension. RESPIRATORY:  Normal respiratory effort without pathologic use of accessory muscles. CARDIOVASCULAR: Regular rhythm and rate. RECTAL: External exam reveals a small area of about 3 cm in the left medial buttocks that feels slightly firm without any fluctuance but without any overlying erythema or induration.  It is tender to palpation.  There is no drainage. MUSCULOSKELETAL:  Normal muscle strength and tone in all four extremities.  No peripheral edema or cyanosis. SKIN: Skin turgor is normal. There are no pathologic skin lesions.  NEUROLOGIC:  Motor and sensation is grossly normal.  Cranial nerves are grossly intact. PSYCH:  Alert and oriented to person, place and time. Affect is normal.  Laboratory Analysis: Results for orders placed or performed during the hospital encounter of 09/06/23 (from the past 24 hours)  Lactic acid, plasma     Status: None   Collection Time: 09/06/23  3:53 PM  Result Value Ref Range   Lactic Acid, Venous 1.8 0.5 - 1.9 mmol/L  Blood Culture (routine x 2)     Status: None (Preliminary result)   Collection Time: 09/06/23  3:53 PM   Specimen: BLOOD RIGHT ARM  Result Value Ref Range   Specimen Description BLOOD RIGHT ARM    Special Requests      BOTTLES DRAWN AEROBIC AND ANAEROBIC Blood Culture results may not be optimal due to an inadequate volume of blood received in culture bottles   Culture      NO GROWTH < 24 HOURS Performed at [facility], [address], [place], [place] [address]    Report Status PENDING  Blood Culture (routine x 2)     Status: None (Preliminary result)   Collection Time: 09/06/23  3:53 PM   Specimen: BLOOD  Result Value Ref Range   Specimen Description BLOOD LEFT ANTECUBITAL    Special Requests      BOTTLES DRAWN AEROBIC AND  ANAEROBIC Blood Culture adequate volume   Culture      NO GROWTH < 24 HOURS Performed at [facility], [address], [place], [place] [address]    Report Status PENDING   Resp panel by RT-PCR (RSV, Flu A&B, Covid) Anterior Nasal Swab     Status: None   Collection Time: 09/06/23  5:01 PM   Specimen: Anterior Nasal Swab  Result Value Ref Range   SARS Coronavirus 2 by RT PCR NEGATIVE NEGATIVE   Influenza A by PCR NEGATIVE NEGATIVE   Influenza B by PCR NEGATIVE NEGATIVE   Resp Syncytial Virus by PCR NEGATIVE NEGATIVE  Troponin I (High Sensitivity)     Status: Abnormal   Collection Time: 09/06/23  6:18 PM  Result Value Ref Range   Troponin I (High Sensitivity) 21 (H) <18 ng/L  Urinalysis, w/ Reflex to Culture (Infection Suspected) -Urine, Clean Catch     Status: Abnormal   Collection Time: 09/06/23  6:47 PM  Result Value Ref Range   Specimen Source URINE, CLEAN CATCH    Color, Urine YELLOW (A) YELLOW   APPearance CLEAR (A) CLEAR   Specific Gravity, Urine 1.017 1.005 - 1.030   pH 5.0 5.0 - 8.0   Glucose, UA NEGATIVE NEGATIVE mg/dL   Hgb urine dipstick NEGATIVE NEGATIVE   Bilirubin Urine NEGATIVE NEGATIVE   Ketones, ur NEGATIVE NEGATIVE mg/dL   Protein, ur NEGATIVE NEGATIVE mg/dL   Nitrite NEGATIVE NEGATIVE   Leukocytes,Ua NEGATIVE NEGATIVE   RBC / HPF 0-5 0 - 5 RBC/hpf   WBC, UA 0-5 0 - 5 WBC/hpf   Bacteria, UA NONE SEEN NONE SEEN   Squamous Epithelial / HPF 0-5 0 - 5 /HPF   Mucus PRESENT   Lactic acid, plasma     Status: None   Collection Time: 09/06/23  9:23 PM  Result Value Ref Range   Lactic Acid, Venous 1.2 0.5 - 1.9 mmol/L  Glucose, capillary     Status: Abnormal   Collection Time: 09/06/23  9:33 PM  Result Value Ref Range   Glucose-Capillary 110 (H) 70 - 99 mg/dL  Protime-INR     Status: None   Collection Time: 09/07/23  5:02 AM  Result Value Ref Range   Prothrombin Time 15.2 11.4 - 15.2 seconds   INR 1.2 0.8 - 1.2  Cortisol-am, blood     Status: None    Collection Time: 09/07/23  5:02 AM  Result Value Ref Range   Cortisol - AM 7.1 6.7 - 22.6 ug/dL  Basic metabolic panel     Status: Abnormal   Collection Time: 09/07/23  5:02 AM  Result Value Ref Range   Sodium 139 135 - 145 mmol/L   Potassium 4.5 3.5 - 5.1 mmol/L   Chloride 109 98 - 111 mmol/L   CO2 24 22 - 32 mmol/L   Glucose, Bld 89 70 - 99 mg/dL   BUN 35 (H) 8 - 23 mg/dL   Creatinine, Ser 1.91 (H) 0.61 - 1.24 mg/dL   Calcium 8.5 (L) 8.9 - 10.3 mg/dL   GFR, Estimated 31 (L) >60 mL/min   Anion gap 6 5 - 15  CBC     Status: Abnormal  Collection Time: 09/07/23  5:02 AM  Result Value Ref Range   WBC 17.1 (H) 4.0 - 10.5 K/uL   RBC 3.99 (L) 4.22 - 5.81 MIL/uL   Hemoglobin 12.6 (L) 13.0 - 17.0 g/dL   HCT 21.3 (L) 08.6 - 57.8 %   MCV 95.2 80.0 - 100.0 fL   MCH 31.6 26.0 - 34.0 pg   MCHC 33.2 30.0 - 36.0 g/dL   RDW 46.9 62.9 - 52.8 %   Platelets 111 (L) 150 - 400 K/uL   nRBC 0.0 0.0 - 0.2 %  Glucose, capillary     Status: None   Collection Time: 09/07/23  7:44 AM  Result Value Ref Range   Glucose-Capillary 75 70 - 99 mg/dL  Glucose, capillary     Status: Abnormal   Collection Time: 09/07/23 11:08 AM  Result Value Ref Range   Glucose-Capillary 102 (H) 70 - 99 mg/dL    Imaging: CT CHEST ABDOMEN PELVIS W CONTRAST Result Date: 09/06/2023 CLINICAL DATA:  Sepsis lower abd pain, sepsis, RLQ pain, concern for perirectal abscess EXAM: CT CHEST, ABDOMEN, AND PELVIS WITH CONTRAST TECHNIQUE: Multidetector CT imaging of the chest, abdomen and pelvis was performed following the standard protocol during bolus administration of intravenous contrast. RADIATION DOSE REDUCTION: This exam was performed according to the departmental dose-optimization program which includes automated exposure control, adjustment of the mA and/or kV according to patient size and/or use of iterative reconstruction technique. CONTRAST:  75mL OMNIPAQUE IOHEXOL 300 MG/ML  SOLN COMPARISON:  None Available. FINDINGS: CT  CHEST FINDINGS Cardiovascular: Normal heart size. No significant pericardial effusion. The thoracic aorta is normal in caliber. Mild atherosclerotic plaque of the thoracic aorta. Left anterior descending coronary artery calcifications versus possible stent-limited evaluation due to motion artifact. Right main coronary artery calcification. Mediastinum/Nodes: No enlarged mediastinal, hilar, or axillary lymph nodes. Thyroid gland, trachea, and esophagus demonstrate no significant findings. Lungs/Pleura: Limited evaluation due to motion artifact. No focal consolidation. No pulmonary nodule. No pulmonary mass. No pleural effusion. No pneumothorax. Musculoskeletal: No chest wall abnormality. No suspicious lytic or blastic osseous lesions. No acute displaced fracture. CT ABDOMEN PELVIS FINDINGS Hepatobiliary: No focal liver abnormality. Status post cholecystectomy. No biliary dilatation. Pancreas: Diffusely atrophic. No focal lesion. Otherwise normal pancreatic contour. No surrounding inflammatory changes. No main pancreatic ductal dilatation. Spleen: Normal in size without focal abnormality. Adrenals/Urinary Tract: No adrenal nodule bilaterally. Bilateral renal cortical scarring. Bilateral kidneys enhance symmetrically. Fluid density lesions likely represent simple renal cysts. Simple renal cysts, in the absence of clinically indicated signs/symptoms, require no independent follow-up. Subcentimeter hypodensity too small to characterize-no further follow-up indicated. No hydronephrosis. No hydroureter. Punctate left nephrolithiasis. No right nephrolithiasis. No ureterolithiasis bilaterally. The urinary bladder is unremarkable. No excretion of intravenous contrast from the kidneys on delayed view. Stomach/Bowel: Stomach is within normal limits. No evidence of bowel wall thickening or dilatation. Colonic diverticulosis. Possible tiny perianal foci of gas with no organized fluid collection. Please note the inferior-most  portion of the gluteal clefts and perineal regions are not visualized due to collimation off view. Status post appendectomy. Vascular/Lymphatic: No abdominal aorta or iliac aneurysm. Moderate atherosclerotic plaque of the aorta and its branches. No abdominal, pelvic, or inguinal lymphadenopathy. Reproductive: Prostate is unremarkable. Other: No intraperitoneal free fluid. No intraperitoneal free gas. No organized fluid collection. Musculoskeletal: Tiny fat containing umbilical hernia. No suspicious lytic or blastic osseous lesions. No acute displaced fracture. L4-S1 posterolateral surgical hardware. No CT evidence of surgical hardware complication. IMPRESSION: 1. No acute intrathoracic, intra-abdominal,  intrapelvic abnormality. 2. Possible tiny perianal foci of gas with no organized fluid collection. Please note the inferior-most portion of the gluteal clefts and perineal regions are not visualized due to collimation off view. 3. Nonobstructive punctate left nephrolithiasis. 4. Colonic diverticulosis with no acute diverticulitis. 5. No excretion of intravenous contrast from the kidneys on delayed view. Recommend correlation with renal function tests. 6.  Aortic Atherosclerosis (ICD10-I70.0). Electronically Signed   By: Tish Frederickson M.D.   On: 09/06/2023 17:30   CT Head Wo Contrast Result Date: 09/06/2023 CLINICAL DATA:  Headache, new onset (Age >= 51y) EXAM: CT HEAD WITHOUT CONTRAST TECHNIQUE: Contiguous axial images were obtained from the base of the skull through the vertex without intravenous contrast. RADIATION DOSE REDUCTION: This exam was performed according to the departmental dose-optimization program which includes automated exposure control, adjustment of the mA and/or kV according to patient size and/or use of iterative reconstruction technique. COMPARISON:  CT head 04/18/2023 FINDINGS: Brain: Patchy and confluent areas of decreased attenuation are noted throughout the deep and periventricular white  matter of the cerebral hemispheres bilaterally, compatible with chronic microvascular ischemic disease. No evidence of large-territorial acute infarction. No parenchymal hemorrhage. No mass lesion. No extra-axial collection. No mass effect or midline shift. No hydrocephalus. Basilar cisterns are patent. Vascular: No hyperdense vessel. Atherosclerotic calcifications are present within the cavernous internal carotid and vertebral arteries. Skull: No acute fracture or focal lesion. Sinuses/Orbits: Right sphenoid sinus mucosal thickening. Paranasal sinuses and mastoid air cells are clear. The orbits are unremarkable. Other: None. IMPRESSION: No acute intracranial abnormality. Electronically Signed   By: Tish Frederickson M.D.   On: 09/06/2023 17:21    Assessment and Plan: This is a 68 y.o. male with right perianal pain.  - Discussed with patient that this area on the right medial buttocks may be an early abscess or phlegmon.  However, there is no palpable fluctuance or skin erythema at this point.  I think at this point we can start IV antibiotics and treat this conservatively.  Discussed with patient that this may resolve on its own with IV antibiotics versus start consolidating better into a true abscess that can be drained.  Will continue following for now. - Given the history of multiple episodes of diarrhea and dehydration and prior history of C. difficile infection, agree with GI panel and C. difficile testing for him particularly given his immunosuppression.  I spent 60 minutes dedicated to the care of this patient on the date of this encounter to include pre-visit review of records, face-to-face time with the patient discussing diagnosis and management, and any post-visit coordination of care.   Howie Ill, MD [facility] Pg:  [phone]

## 2023-09-07 NOTE — Progress Notes (Signed)
Per Dr Sreenath, dc tele monitoring  

## 2023-09-07 NOTE — Plan of Care (Signed)
  Problem: Fluid Volume: Goal: Hemodynamic stability will improve Outcome: Progressing   Problem: Clinical Measurements: Goal: Signs and symptoms of infection will decrease Outcome: Progressing   Problem: Clinical Measurements: Goal: Will remain free from infection Outcome: Progressing   Problem: Coping: Goal: Level of anxiety will decrease Outcome: Progressing

## 2023-09-07 NOTE — Progress Notes (Signed)
 PROGRESS NOTE    Devin White  WUJ:[id number] DOB: 30-Jul-1955 DOA: 09/06/2023 PCP: Karie Schwalbe, MD    Brief Narrative:  68 y.o. male with medical history significant for liver cirrhosis and hepatic cell failure status post liver transplant in 2009 on immunosuppressive therapy, CHF, GERD, BPH, essential hypertension, CVA, stage IIIa chronic kidney disease, gout, and osteoarthritis, who presented to the emergency room with acute onset of presyncope.  He stated that he has been feeling sick for couple weeks.  He did have a syncopal episode about a couple weeks ago.  He has not been feeling well since then generalized weakness and fatigue intermittently.  Last week he had recurrent diarrhea for about 17 episodes.  He denies any melena or bright red bleeding per rectum.  Started having worsening abdominal pain today more on the right side and noticed a small area near his rectum that has significant increased in size over the last couple of days.  He admitted to tactile fever and chills.  He has been having dry cough with no dyspnea or wheezing.  No chest pain or palpitations.  No nausea or vomiting.  Denies any trauma or injuries but was having headache.  He has mild dysuria without hematuria urgency or frequency or flank pain.    Assessment & Plan:   Principal Problem:   Sepsis due to undetermined organism [facility]) Active Problems:   Near syncope   Acute kidney injury superimposed on chronic kidney disease (HCC)   Type 2 diabetes mellitus with peripheral neuropathy (HCC)   Essential hypertension   Gout   Dyslipidemia   * Sepsis due to undetermined organism (HCC) Suspect GI pathogen Possible perirectal abscess Patient has a history of C. difficile colitis.  Last course was approximately 2 years ago.  No recent exposure to antibiotics however the patient is status post liver transplant on chronic immunosuppression.  Case discussed with general surgery regarding possible perirectal  abscess.  Recommends empiric antibiotic therapy.  No recommendations for surgical intervention at this time. Plan: Check C. difficile and GI PCR Contact isolation Continue IV Rocephin and Flagyl for now IV fluids Monitor vitals and fever curve  Near syncope Suspect intravascular volume depletion in the setting of diarrheal illness/sepsis.  Treatment as above.  IV hydration as above.  Acute kidney injury superimposed on chronic kidney disease (HCC) Kidney function improving.  Continue aggressive intravenous fluid resuscitation.  Avoid nonessential nephrotoxins.   Type 2 diabetes mellitus with peripheral neuropathy (HCC) Hold oral agents.  Hold long-acting for now.  Sliding scale coverage.  Carb modified diet.  Continue Neurontin.   Essential hypertension Resume amlodipine.  Hold lisinopril   Dyslipidemia PT statin   Gout No evidence of exacerbation  Status post liver transplant Chronic immunosuppression Resume immunosuppressive therapy  Obesity BMI 35.08.  Complicating factor in overall care and prognosis.   DVT prophylaxis: Lovenox Code Status: Full Family Communication: None Disposition Plan: Status is: Inpatient Remains inpatient appropriate because: Sepsis, diarrheal illness   Level of care: Telemetry Medical  Consultants:  General surgery  Procedures:  None  Antimicrobials: Rocephin Flagyl   Subjective: Seen and examined.  Resting in bed.  Reports fatigue from diarrhea.  No pain complaints  Objective: Vitals:   09/06/23 1949 09/06/23 2014 09/07/23 0404 09/07/23 0747  BP: 130/74  135/71 139/71  Pulse: (!) 104  77 88  Resp: 20  18 16   Temp: 98.3 F (36.8 C)  98.2 F (36.8 C) 98.3 F (36.8 C)  TempSrc: Oral  Oral   SpO2: 97%  96% 95%  Weight:  120.6 kg    Height:  6\' 1"  (1.854 m)      Intake/Output Summary (Last 24 hours) at 09/07/2023 1153 Last data filed at 09/07/2023 1146 Gross per 24 hour  Intake 340 ml  Output 950 ml  Net -610 ml    Filed Weights   09/06/23 1425 09/06/23 2014  Weight: 117.9 kg 120.6 kg    Examination:  General exam: Appears calm and comfortable.  Appears fatigued Respiratory system: Clear to auscultation. Respiratory effort normal. Cardiovascular system: S1-S2, RRR, no murmurs, no pedal edema Gastrointestinal system: Obese, soft, mild TTP, nondistended, hyperactive bowel sounds Central nervous system: Alert and oriented. No focal neurological deficits. Extremities: Symmetric 5 x 5 power. Skin: No rashes, lesions or ulcers Psychiatry: Judgement and insight appear normal. Mood & affect appropriate.     Data Reviewed: I have personally reviewed following labs and imaging studies  CBC: Recent Labs  Lab 09/06/23 1428 09/07/23 0502  WBC 26.8* 17.1*  HGB 15.2 12.6*  HCT 45.2 38.0*  MCV 94.8 95.2  PLT 183 111*   Basic Metabolic Panel: Recent Labs  Lab 09/06/23 1428 09/07/23 0502  NA 134* 139  K 4.8 4.5  CL 109 109  CO2 20* 24  GLUCOSE 172* 89  BUN 37* 35*  CREATININE 2.67* 2.28*  CALCIUM 9.0 8.5*   GFR: Estimated Creatinine Clearance: 44 mL/min (A) (by C-G formula based on SCr of 2.28 mg/dL (H)). Liver Function Tests: Recent Labs  Lab 09/06/23 1428  AST 23  ALT 22  ALKPHOS 37*  BILITOT 2.7*  PROT 6.8  ALBUMIN 4.0   Recent Labs  Lab 09/06/23 1428  LIPASE 28   No results for input(s): "AMMONIA" in the last 168 hours. Coagulation Profile: Recent Labs  Lab 09/07/23 0502  INR 1.2   Cardiac Enzymes: No results for input(s): "CKTOTAL", "CKMB", "CKMBINDEX", "TROPONINI" in the last 168 hours. BNP (last 3 results) No results for input(s): "PROBNP" in the last 8760 hours. HbA1C: No results for input(s): "HGBA1C" in the last 72 hours. CBG: Recent Labs  Lab 09/06/23 1435 09/06/23 2133 09/07/23 0744 09/07/23 1108  GLUCAP 173* 110* 75 102*   Lipid Profile: No results for input(s): "CHOL", "HDL", "LDLCALC", "TRIG", "CHOLHDL", "LDLDIRECT" in the last 72  hours. Thyroid Function Tests: No results for input(s): "TSH", "T4TOTAL", "FREET4", "T3FREE", "THYROIDAB" in the last 72 hours. Anemia Panel: No results for input(s): "VITAMINB12", "FOLATE", "FERRITIN", "TIBC", "IRON", "RETICCTPCT" in the last 72 hours. Sepsis Labs: Recent Labs  Lab 09/06/23 1553 09/06/23 2123  LATICACIDVEN 1.8 1.2    Recent Results (from the past 240 hours)  Blood Culture (routine x 2)     Status: None (Preliminary result)   Collection Time: 09/06/23  3:53 PM   Specimen: BLOOD RIGHT ARM  Result Value Ref Range Status   Specimen Description BLOOD RIGHT ARM  Final   Special Requests   Final    BOTTLES DRAWN AEROBIC AND ANAEROBIC Blood Culture results may not be optimal due to an inadequate volume of blood received in culture bottles   Culture   Final    NO GROWTH < 24 HOURS Performed at [facility], [address] [place], [place] [address]    Report Status PENDING  Incomplete  Blood Culture (routine x 2)     Status: None (Preliminary result)   Collection Time: 09/06/23  3:53 PM   Specimen: BLOOD  Result Value Ref Range Status  Specimen Description BLOOD LEFT ANTECUBITAL  Final   Special Requests   Final    BOTTLES DRAWN AEROBIC AND ANAEROBIC Blood Culture adequate volume   Culture   Final    NO GROWTH < 24 HOURS Performed at [facility], [address], [place], [place] [address]    Report Status PENDING  Incomplete  Resp panel by RT-PCR (RSV, Flu A&B, Covid) Anterior Nasal Swab     Status: None   Collection Time: 09/06/23  5:01 PM   Specimen: Anterior Nasal Swab  Result Value Ref Range Status   SARS Coronavirus 2 by RT PCR NEGATIVE NEGATIVE Final    Comment: (NOTE) SARS-CoV-2 target nucleic acids are NOT DETECTED.  The SARS-CoV-2 RNA is generally detectable in upper respiratory specimens during the acute phase of infection. The lowest concentration of SARS-CoV-2 viral copies this assay can detect is 138 copies/mL. A negative  result does not preclude SARS-Cov-2 infection and should not be used as the sole basis for treatment or other patient management decisions. A negative result may occur with  improper specimen collection/handling, submission of specimen other than nasopharyngeal swab, presence of viral mutation(s) within the areas targeted by this assay, and inadequate number of viral copies(<138 copies/mL). A negative result must be combined with clinical observations, patient history, and epidemiological information. The expected result is Negative.  Fact Sheet for Patients:  BloggerCourse.com  Fact Sheet for Healthcare Providers:  SeriousBroker.it  This test is no t yet approved or cleared by the [place] FDA and  has been authorized for detection and/or diagnosis of SARS-CoV-2 by FDA under an Emergency Use Authorization (EUA). This EUA will remain  in effect (meaning this test can be used) for the duration of the COVID-19 declaration under Section 564(b)(1) of the Act, 21 U.S.C.section 360bbb-3(b)(1), unless the authorization is terminated  or revoked sooner.       Influenza A by PCR NEGATIVE NEGATIVE Final   Influenza B by PCR NEGATIVE NEGATIVE Final    Comment: (NOTE) The Xpert Xpress SARS-CoV-2/FLU/RSV plus assay is intended as an aid in the diagnosis of influenza from Nasopharyngeal swab specimens and should not be used as a sole basis for treatment. Nasal washings and aspirates are unacceptable for Xpert Xpress SARS-CoV-2/FLU/RSV testing.  Fact Sheet for Patients: BloggerCourse.com  Fact Sheet for Healthcare Providers: SeriousBroker.it  This test is not yet approved or cleared by the [place] FDA and has been authorized for detection and/or diagnosis of SARS-CoV-2 by FDA under an Emergency Use Authorization (EUA). This EUA will remain in effect (meaning this test can be used)  for the duration of the COVID-19 declaration under Section 564(b)(1) of the Act, 21 U.S.C. section 360bbb-3(b)(1), unless the authorization is terminated or revoked.     Resp Syncytial Virus by PCR NEGATIVE NEGATIVE Final    Comment: (NOTE) Fact Sheet for Patients: BloggerCourse.com  Fact Sheet for Healthcare Providers: SeriousBroker.it  This test is not yet approved or cleared by the [place] FDA and has been authorized for detection and/or diagnosis of SARS-CoV-2 by FDA under an Emergency Use Authorization (EUA). This EUA will remain in effect (meaning this test can be used) for the duration of the COVID-19 declaration under Section 564(b)(1) of the Act, 21 U.S.C. section 360bbb-3(b)(1), unless the authorization is terminated or revoked.  Performed at [facility], [address], [place], [place] [address]          Radiology Studies: CT CHEST ABDOMEN PELVIS W CONTRAST Result Date: 09/06/2023 CLINICAL  DATA:  Sepsis lower abd pain, sepsis, RLQ pain, concern for perirectal abscess EXAM: CT CHEST, ABDOMEN, AND PELVIS WITH CONTRAST TECHNIQUE: Multidetector CT imaging of the chest, abdomen and pelvis was performed following the standard protocol during bolus administration of intravenous contrast. RADIATION DOSE REDUCTION: This exam was performed according to the departmental dose-optimization program which includes automated exposure control, adjustment of the mA and/or kV according to patient size and/or use of iterative reconstruction technique. CONTRAST:  75mL OMNIPAQUE IOHEXOL 300 MG/ML  SOLN COMPARISON:  None Available. FINDINGS: CT CHEST FINDINGS Cardiovascular: Normal heart size. No significant pericardial effusion. The thoracic aorta is normal in caliber. Mild atherosclerotic plaque of the thoracic aorta. Left anterior descending coronary artery calcifications versus possible stent-limited evaluation due to motion  artifact. Right main coronary artery calcification. Mediastinum/Nodes: No enlarged mediastinal, hilar, or axillary lymph nodes. Thyroid gland, trachea, and esophagus demonstrate no significant findings. Lungs/Pleura: Limited evaluation due to motion artifact. No focal consolidation. No pulmonary nodule. No pulmonary mass. No pleural effusion. No pneumothorax. Musculoskeletal: No chest wall abnormality. No suspicious lytic or blastic osseous lesions. No acute displaced fracture. CT ABDOMEN PELVIS FINDINGS Hepatobiliary: No focal liver abnormality. Status post cholecystectomy. No biliary dilatation. Pancreas: Diffusely atrophic. No focal lesion. Otherwise normal pancreatic contour. No surrounding inflammatory changes. No main pancreatic ductal dilatation. Spleen: Normal in size without focal abnormality. Adrenals/Urinary Tract: No adrenal nodule bilaterally. Bilateral renal cortical scarring. Bilateral kidneys enhance symmetrically. Fluid density lesions likely represent simple renal cysts. Simple renal cysts, in the absence of clinically indicated signs/symptoms, require no independent follow-up. Subcentimeter hypodensity too small to characterize-no further follow-up indicated. No hydronephrosis. No hydroureter. Punctate left nephrolithiasis. No right nephrolithiasis. No ureterolithiasis bilaterally. The urinary bladder is unremarkable. No excretion of intravenous contrast from the kidneys on delayed view. Stomach/Bowel: Stomach is within normal limits. No evidence of bowel wall thickening or dilatation. Colonic diverticulosis. Possible tiny perianal foci of gas with no organized fluid collection. Please note the inferior-most portion of the gluteal clefts and perineal regions are not visualized due to collimation off view. Status post appendectomy. Vascular/Lymphatic: No abdominal aorta or iliac aneurysm. Moderate atherosclerotic plaque of the aorta and its branches. No abdominal, pelvic, or inguinal  lymphadenopathy. Reproductive: Prostate is unremarkable. Other: No intraperitoneal free fluid. No intraperitoneal free gas. No organized fluid collection. Musculoskeletal: Tiny fat containing umbilical hernia. No suspicious lytic or blastic osseous lesions. No acute displaced fracture. L4-S1 posterolateral surgical hardware. No CT evidence of surgical hardware complication. IMPRESSION: 1. No acute intrathoracic, intra-abdominal, intrapelvic abnormality. 2. Possible tiny perianal foci of gas with no organized fluid collection. Please note the inferior-most portion of the gluteal clefts and perineal regions are not visualized due to collimation off view. 3. Nonobstructive punctate left nephrolithiasis. 4. Colonic diverticulosis with no acute diverticulitis. 5. No excretion of intravenous contrast from the kidneys on delayed view. Recommend correlation with renal function tests. 6.  Aortic Atherosclerosis (ICD10-I70.0). Electronically Signed   By: Tish Frederickson M.D.   On: 09/06/2023 17:30   CT Head Wo Contrast Result Date: 09/06/2023 CLINICAL DATA:  Headache, new onset (Age >= 51y) EXAM: CT HEAD WITHOUT CONTRAST TECHNIQUE: Contiguous axial images were obtained from the base of the skull through the vertex without intravenous contrast. RADIATION DOSE REDUCTION: This exam was performed according to the departmental dose-optimization program which includes automated exposure control, adjustment of the mA and/or kV according to patient size and/or use of iterative reconstruction technique. COMPARISON:  CT head 04/18/2023 FINDINGS: Brain: Patchy and confluent areas of  decreased attenuation are noted throughout the deep and periventricular white matter of the cerebral hemispheres bilaterally, compatible with chronic microvascular ischemic disease. No evidence of large-territorial acute infarction. No parenchymal hemorrhage. No mass lesion. No extra-axial collection. No mass effect or midline shift. No hydrocephalus.  Basilar cisterns are patent. Vascular: No hyperdense vessel. Atherosclerotic calcifications are present within the cavernous internal carotid and vertebral arteries. Skull: No acute fracture or focal lesion. Sinuses/Orbits: Right sphenoid sinus mucosal thickening. Paranasal sinuses and mastoid air cells are clear. The orbits are unremarkable. Other: None. IMPRESSION: No acute intracranial abnormality. Electronically Signed   By: Tish Frederickson M.D.   On: 09/06/2023 17:21        Scheduled Meds:  enoxaparin (LOVENOX) injection  0.5 mg/kg Subcutaneous Q24H   insulin aspart  0-15 Units Subcutaneous TID WC   insulin aspart  0-5 Units Subcutaneous QHS   Continuous Infusions:  cefTRIAXone (ROCEPHIN)  IV     lactated ringers 100 mL/hr at 09/07/23 1145   metronidazole 500 mg (09/07/23 0827)     LOS: 1 day   Tresa Moore, MD [facility]   If 7PM-7AM, please contact night-coverage  09/07/2023, 11:53 AM

## 2023-09-07 NOTE — Progress Notes (Signed)
 [facility] CONSULT NOTE    Date: 09/07/2023                  Patient Name:  Devin White  MRN: [id number]  DOB: [birth year]  Age / Sex: 68 y.o., male         PCP: Karie Schwalbe, MD                 Service Requesting Consult:                  Reason for Consult: Chronic kidney disease and requirement for IV contrast.             History of Present Illness: Patient is a 68 y.o. male with a PMHx of hypertension, coronary artery disease, congestive heart failure, diabetes, BPH, history of CVA, history of chronic kidney disease stage IIIa and a history of liver cirrhosis s/p transplant in 2009.  Patient now being admitted with history of diarrhea, abdominal pain and syncope/presyncopal episodes.  Patient required CT scan of the abdomen and pelvis with IV contrast to rule out any acute abdominal process. He had a CT scan of the chest, abdomen and pelvis with IV contrast. He has been on mycophenolate, tacrolimus and prednisone. He is also on Bactrim, furosemide and ACE inhibitor's.  Medications: Outpatient medications: Medications Prior to Admission  Medication Sig Dispense Refill Last Dose/Taking   acetaminophen (TYLENOL) 325 MG tablet Take 650 mg by mouth every 6 (six) hours as needed for mild pain.   09/06/2023 at 11:00 AM   amLODipine (NORVASC) 10 MG tablet Take 1 tablet (10 mg total) by mouth daily. 90 tablet 3 09/06/2023 at 11:00 AM   aspirin EC 81 MG tablet Take 1 tablet (81 mg total) by mouth daily. Swallow whole. 30 tablet 0 09/06/2023 at 11:00 AM   calcium carbonate (TUMS) 500 MG chewable tablet Chew 1 tablet (200 mg of elemental calcium total) by mouth 2 (two) times daily as needed for indigestion or heartburn. 180 tablet 1 Past Week   colchicine 0.6 MG tablet Take 1 tablet (0.6 mg total) by mouth daily as needed. 30 tablet 1 Taking As Needed   diclofenac Sodium (VOLTAREN) 1 % GEL APPLY 4 GRAMS TOPICALLY TO THE AFFECTED AREA FOUR TIMES DAILY 100 g 5  Past Week   furosemide (LASIX) 40 MG tablet TAKE 1 TABLET(40 MG) BY MOUTH DAILY 90 tablet 3 Past Week   gabapentin (NEURONTIN) 300 MG capsule Take 1 capsule (300 mg total) by mouth 3 (three) times daily. 270 capsule 3 09/06/2023 at 11:00 AM   glipiZIDE (GLUCOTROL XL) 2.5 MG 24 hr tablet TAKE 1 TABLET(2.5 MG) BY MOUTH DAILY WITH BREAKFAST 90 tablet 0 09/06/2023 at 11:00 AM   insulin glargine (LANTUS) 100 UNIT/ML Solostar Pen Inject 30 Units into the skin daily. 1 mL 0 09/05/2023   insulin lispro (HUMALOG KWIKPEN) 100 UNIT/ML KwikPen 0-15 Units, Subcutaneous, 3 times daily before meals & bedtime  CBG 70 - 120: 0 units  CBG 121 - 150: 2 units  CBG 151 - 200: 3 units  CBG 201 - 250: 5 units  CBG 251 - 300: 8 units  CBG 301 - 350: 11 units  CBG 351 - 400: 15 units  CBG > 400: call MD 15 mL 11 09/05/2023   lisinopril (ZESTRIL) 20 MG tablet Take 20 mg by mouth daily.   09/06/2023 at 11:00 AM   methocarbamol (ROBAXIN) 500 MG tablet Take by mouth 2 (two)  times daily.   09/06/2023 at 11:00 AM   Multiple Vitamin (MULTIVITAMIN WITH MINERALS) TABS tablet Take 1 tablet by mouth daily.   09/06/2023 at 11:00 AM   mycophenolate (CELLCEPT) 250 MG capsule Take 250-500 mg by mouth 2 (two) times daily. 2 capsules (500 mg) in the morning and one capsule (250 mg) in the evening   09/06/2023 at 11:00 AM   nortriptyline (PAMELOR) 10 MG capsule Take 10 mg by mouth 2 (two) times daily.   09/06/2023 at 11:00 AM   omeprazole (PRILOSEC) 20 MG capsule TAKE 1 CAPSULE(20 MG) BY MOUTH DAILY 30 capsule 11 09/06/2023 at 11:00 AM   predniSONE (DELTASONE) 10 MG tablet Take 10 mg by mouth daily with breakfast.   09/06/2023 at 11:00 AM   rosuvastatin (CRESTOR) 20 MG tablet Take 1 tablet (20 mg total) by mouth daily. 90 tablet 3 09/06/2023 at 11:00 AM   tacrolimus (PROGRAF) 0.5 MG capsule Take 0.5-1 mg by mouth See admin instructions. Taking 0.5 mg in the morning and 1 mg at bedtime   09/06/2023 at 11:00 AM   tamsulosin (FLOMAX) 0.4 MG CAPS  capsule TAKE 1 CAPSULE(0.4 MG) BY MOUTH DAILY 90 capsule 3 09/06/2023 at 11:00 AM   trimethoprim (TRIMPEX) 100 MG tablet Take 100 mg by mouth daily.   09/06/2023 at 11:00 AM   Blood Glucose Monitoring Suppl DEVI 1 each by Does not apply route in the morning, at noon, and at bedtime. May substitute to any manufacturer covered by patient's insurance. 1 each 0    Continuous Glucose Sensor (FREESTYLE LIBRE 3 PLUS SENSOR) MISC Change sensor every 15 days. 2 each 12    feeding supplement, GLUCERNA SHAKE, (GLUCERNA SHAKE) LIQD Take 237 mLs by mouth 2 (two) times daily between meals. 237 mL 0    Insulin Pen Needle 32G X 4 MM MISC 1 each by Does not apply route as directed. 200 each 11    lisinopril (ZESTRIL) 40 MG tablet Take 0.5 tablets (20 mg total) by mouth daily. (Patient not taking: Reported on 09/07/2023) 30 tablet 1 Not Taking    Discontinued Meds:   Medications Discontinued During This Encounter  Medication Reason   lactated ringers infusion    lactated ringers infusion     Current medications: Current Facility-Administered Medications  Medication Dose Route Frequency Provider Last Rate Last Admin   acetaminophen (TYLENOL) tablet 650 mg  650 mg Oral Q6H PRN Mansy, Jan A, MD   650 mg at 09/07/23 4782   Or   acetaminophen (TYLENOL) suppository 650 mg  650 mg Rectal Q6H PRN Mansy, Jan A, MD       amLODipine (NORVASC) tablet 10 mg  10 mg Oral Daily Lolita Patella B, MD   10 mg at 09/07/23 1258   aspirin EC tablet 81 mg  81 mg Oral Daily Lolita Patella B, MD   81 mg at 09/07/23 1258   calcium carbonate (TUMS - dosed in mg elemental calcium) chewable tablet 200 mg of elemental calcium  1 tablet Oral BID PRN Lolita Patella B, MD       cefTRIAXone (ROCEPHIN) 2 g in sodium chloride 0.9 % 100 mL IVPB  2 g Intravenous Q24H Mansy, Jan A, MD       enoxaparin (LOVENOX) injection 60 mg  0.5 mg/kg Subcutaneous Q24H Mansy, Jan A, MD   60 mg at 09/06/23 1950   gabapentin (NEURONTIN) capsule 300 mg   300 mg Oral TID Tresa Moore, MD  insulin aspart (novoLOG) injection 0-15 Units  0-15 Units Subcutaneous TID WC Mansy, Jan A, MD       insulin aspart (novoLOG) injection 0-5 Units  0-5 Units Subcutaneous QHS Mansy, Jan A, MD       lactated ringers infusion   Intravenous Continuous Lolita Patella B, MD 100 mL/hr at 09/07/23 1301 New Bag at 09/07/23 1301   magnesium hydroxide (MILK OF MAGNESIA) suspension 30 mL  30 mL Oral Daily PRN Mansy, Jan A, MD       methocarbamol (ROBAXIN) tablet 500 mg  500 mg Oral BID Lolita Patella B, MD   500 mg at 09/07/23 1258   metroNIDAZOLE (FLAGYL) IVPB 500 mg  500 mg Intravenous Q12H Mansy, Jan A, MD 100 mL/hr at 09/07/23 0827 500 mg at 09/07/23 9147   mycophenolate (CELLCEPT) capsule 250 mg  250 mg Oral QPM Sreenath, Sudheer B, MD       mycophenolate (CELLCEPT) capsule 500 mg  500 mg Oral q morning Georgeann Oppenheim, Sudheer B, MD   500 mg at 09/07/23 1344   nortriptyline (PAMELOR) capsule 10 mg  10 mg Oral BID Lolita Patella B, MD   10 mg at 09/07/23 1344   ondansetron (ZOFRAN) tablet 4 mg  4 mg Oral Q6H PRN Mansy, Jan A, MD       Or   ondansetron [facility]) injection 4 mg  4 mg Intravenous Q6H PRN Mansy, Vernetta Honey, MD       [START ON 09/08/2023] pantoprazole (PROTONIX) EC tablet 40 mg  40 mg Oral Daily Sreenath, Sudheer B, MD       Melene Muller ON 09/08/2023] predniSONE (DELTASONE) tablet 10 mg  10 mg Oral Q breakfast Sreenath, Sudheer B, MD       rosuvastatin (CRESTOR) tablet 20 mg  20 mg Oral Daily Sreenath, Sudheer B, MD   20 mg at 09/07/23 1257   [START ON 09/08/2023] tacrolimus (PROGRAF) capsule 0.5 mg  0.5 mg Oral q morning Sreenath, Sudheer B, MD       tacrolimus (PROGRAF) capsule 1 mg  1 mg Oral QHS Sreenath, Sudheer B, MD       tamsulosin (FLOMAX) capsule 0.4 mg  0.4 mg Oral Daily Sreenath, Sudheer B, MD   0.4 mg at 09/07/23 1257   traZODone (DESYREL) tablet 25 mg  25 mg Oral QHS PRN Mansy, Jan A, MD   25 mg at 09/06/23 2153   trimethoprim (TRIMPEX) tablet  100 mg  100 mg Oral Daily Lolita Patella B, MD   100 mg at 09/07/23 1344      Allergies: Allergies  Allergen Reactions   Levaquin [Levofloxacin] Shortness Of Breath    SOB and severe leg pain/weakness      Past Medical History: Past Medical History:  Diagnosis Date   Arthritis    back and legs   Benign prostatic hypertrophy    CHF (congestive heart failure) (HCC)    GERD (gastroesophageal reflux disease)    Gout    History of blood transfusion    pt has antibodies in his blood since previous transfusions   History of cirrhosis of liver S/P TRANSPLANT 2009   History of liver failure S/P TRANSPLANT   Hypertension    Left ureteral calculus    Renal disorder    Stroke [facility])      Past Surgical History: Past Surgical History:  Procedure Laterality Date   APPENDECTOMY     bone morrow biopsy     CHOLECYSTECTOMY  2007   CYSTO/ LEFT RETROGRADE  PYELOGRAM/ LEFT URETERAL STENT PLACEMENT  03-05-2012  DR Berneice Hein[facility])   LEFT URETERAL CALCULI   CYSTOSCOPY W/ URETERAL STENT PLACEMENT  03/11/2012   Procedure: CYSTOSCOPY WITH STENT REPLACEMENT;  Surgeon: Sebastian Ache, MD;  Location: [facility];  Service: Urology;  Laterality: Left;   HERNIA REPAIR  2008   LIVER BIOPSY     LIVER TRANSPLANT  11/24/2007   pt states doing well since liver transplant   LUMBAR DISC SURGERY     LUMBAR FUSION     removal of fistula     URETEROSCOPY  03/11/2012   Procedure: URETEROSCOPY;  Surgeon: Sebastian Ache, MD;  Location: [facility];  Service: Urology;  Laterality: Left;   STONE MANIPULATION, stone obtained  [phone] [employer] MCR     Family History: Family History  Problem Relation Age of Onset   Cancer Mother        colon   Arthritis Mother    Vasculitis Mother    Hypertension Mother    Cancer Father        colon   Kidney disease Father    Arthritis Father    Arthritis Brother    Alcohol abuse Maternal Aunt    Diabetes Maternal Aunt    Alcohol abuse  Maternal Uncle    Diabetes Paternal Aunt    Alcohol abuse Maternal Uncle    Alcohol abuse Maternal Uncle      Social History: Social History   Socioeconomic History   Marital status: Single    Spouse name: Not on file   Number of children: 3   Years of education: Not on file   Highest education level: 9th grade  Occupational History   Occupation: disabled    Employer: RETIRED  Tobacco Use   Smoking status: Never    Passive exposure: Past   Smokeless tobacco: Never  Vaping Use   Vaping status: Never Used  Substance and Sexual Activity   Alcohol use: No   Drug use: No   Sexual activity: Not on file  Other Topics Concern   Not on file  Social History Narrative   Has living will   Requests daughter [name] as his health care POA   Would accept brief attempt at resuscitation but no prolonged ventilation   No tube feeds if cognitively unaware   Social Drivers of Health   Financial Resource Strain: Low Risk  (07/28/2023)   Overall Financial Resource Strain (CARDIA)    Difficulty of Paying Living Expenses: Not very hard  Food Insecurity: No Food Insecurity (09/06/2023)   Hunger Vital Sign    Worried About Running Out of Food in the Last Year: Never true    Ran Out of Food in the Last Year: Never true  Transportation Needs: No Transportation Needs (09/06/2023)   PRAPARE - Administrator, Civil Service (Medical): No    Lack of Transportation (Non-Medical): No  Physical Activity: Insufficiently Active (07/28/2023)   Exercise Vital Sign    Days of Exercise per Week: 2 days    Minutes of Exercise per Session: 20 min  Stress: No Stress Concern Present (07/28/2023)   [employer] of Occupational Health - Occupational Stress Questionnaire    Feeling of Stress : Not at all  Social Connections: Socially Isolated (09/06/2023)   Social Connection and Isolation Panel [NHANES]    Frequency of Communication with Friends and Family: Three times a week    Frequency of  Social Gatherings with Friends and Family: Twice a  week    Attends Religious Services: Never    Active Member of Clubs or Organizations: No    Attends Banker Meetings: Never    Marital Status: Divorced  Catering manager Violence: Not At Risk (09/07/2023)   Humiliation, Afraid, Rape, and Kick questionnaire    Fear of Current or Ex-Partner: No    Emotionally Abused: No    Physically Abused: No    Sexually Abused: No     Review of Systems: As per HPI  Vital Signs: Blood pressure 139/71, pulse 88, temperature 98.3 F (36.8 C), resp. rate 16, height 6\' 1"  (1.854 m), weight 120.6 kg, SpO2 95%.  Weight trends: Filed Weights   09/06/23 1425 09/06/23 2014  Weight: 117.9 kg 120.6 kg    Physical Exam: Physical Exam: General:  No acute distress  Head:  Normocephalic, atraumatic. Moist oral mucosal membranes  Eyes:  Anicteric  Neck:  Supple  Lungs:   Clear to auscultation, normal effort  Heart:  S1S2 no rubs  Abdomen:   Soft, nontender, bowel sounds present  Extremities:  peripheral edema.  Neurologic:  Awake, alert, following commands  Skin:  No lesions  Access:     Lab results:  Basic Metabolic Panel: Recent Labs  Lab 09/06/23 1428 09/07/23 0502  NA 134* 139  K 4.8 4.5  CL 109 109  CO2 20* 24  GLUCOSE 172* 89  BUN 37* 35*  CREATININE 2.67* 2.28*  CALCIUM 9.0 8.5*    Creatinine  Date/Time Value Ref Range Status  11/30/2013 04:07 PM 1.40 (H) 0.60 - 1.30 mg/dL Final  65/78/4696 29:52 PM 1.31 (H) 0.60 - 1.30 mg/dL Final  84/13/2440 10:27 PM 1.33 (H) 0.60 - 1.30 mg/dL Final  25/36/6440 34:74 PM 1.28 0.60 - 1.30 mg/dL Final   Creatinine, Ser  Date/Time Value Ref Range Status  09/07/2023 05:02 AM 2.28 (H) 0.61 - 1.24 mg/dL Final  25/95/6387 56:43 PM 2.67 (H) 0.61 - 1.24 mg/dL Final  32/95/1884 16:60 AM 1.60 (H) 0.61 - 1.24 mg/dL Final  63/06/6008 93:23 AM 1.53 (H) 0.61 - 1.24 mg/dL Final  55/73/2202 54:27 AM 1.72 (H) 0.61 - 1.24 mg/dL Final   12/08/7626 31:51 AM 1.67 (H) 0.61 - 1.24 mg/dL Final  76/16/0737 10:62 PM 1.77 (H) 0.61 - 1.24 mg/dL Final  69/48/5462 70:35 PM 1.85 (H) 0.61 - 1.24 mg/dL Final  00/93/8182 99:37 PM 2.18 (H) 0.61 - 1.24 mg/dL Final  16/96/7893 81:01 AM 1.56 (H) 0.61 - 1.24 mg/dL Final  75/03/2584 27:78 PM 2.03 (H) 0.61 - 1.24 mg/dL Final  24/23/5361 44:31 AM 1.53 (H) 0.61 - 1.24 mg/dL Final  54/00/8676 19:50 AM 1.34 (H) 0.61 - 1.24 mg/dL Final  93/26/7124 58:09 AM 1.58 (H) 0.61 - 1.24 mg/dL Final  98/33/8250 53:97 AM 1.57 (H) 0.61 - 1.24 mg/dL Final  67/34/1937 90:24 PM 1.81 (H) 0.61 - 1.24 mg/dL Final  09/73/5329 92:42 AM 1.38 (H) 0.61 - 1.24 mg/dL Final  68/34/1962 22:97 PM 1.34 (H) 0.61 - 1.24 mg/dL Final  98/92/1194 17:40 PM 1.49 (H) 0.61 - 1.24 mg/dL Final  81/44/8185 63:14 AM 1.23 0.61 - 1.24 mg/dL Final  97/07/6376 58:85 AM 1.52 (H) 0.61 - 1.24 mg/dL Final  02/77/4128 78:67 AM 1.61 (H) 0.61 - 1.24 mg/dL Final  67/20/9470 96:28 AM 1.83 (H) 0.61 - 1.24 mg/dL Final  36/62/9476 54:65 AM 1.70 (H) 0.61 - 1.24 mg/dL Final  03/54/6568 12:75 PM 1.69 (H) 0.61 - 1.24 mg/dL Final  17/00/1749 44:96 PM 1.68 (H) 0.61 - 1.24 mg/dL Final  11/06/2021 03:42 AM 1.53 (H) 0.61 - 1.24 mg/dL Final  16/03/9603 54:09 AM 1.53 (H) 0.61 - 1.24 mg/dL Final  81/19/1478 29:56 AM 1.70 (H) 0.61 - 1.24 mg/dL Final  21/30/8657 84:69 AM 1.43 (H) 0.61 - 1.24 mg/dL Final  62/95/2841 32:44 AM 1.17 0.61 - 1.24 mg/dL Final  06/19/7251 66:44 AM 1.40 (H) 0.61 - 1.24 mg/dL Final  03/47/4259 56:38 AM 1.28 (H) 0.61 - 1.24 mg/dL Final  75/64/3329 51:88 AM 1.14 0.61 - 1.24 mg/dL Final  41/66/0630 16:01 AM 1.16 0.61 - 1.24 mg/dL Final  09/32/3557 32:20 AM 1.39 (H) 0.61 - 1.24 mg/dL Final    Comment:    DELTA CHECK NOTED  10/27/2021 05:22 AM 2.49 (H) 0.61 - 1.24 mg/dL Final  25/42/7062 37:62 AM 1.89 (H) 0.61 - 1.24 mg/dL Final  83/15/1761 60:73 PM 1.26 (H) 0.61 - 1.24 mg/dL Final  71/11/2692 85:46 PM 1.47 (H) 0.61 - 1.24 mg/dL Final   27/08/5007 38:18 AM 1.47 0.40 - 1.50 mg/dL Final  29/93/7169 67:89 PM 1.76 (H) 0.61 - 1.24 mg/dL Final  38/03/1750 02:58 AM 1.39 (H) 0.61 - 1.24 mg/dL Final  52/77/8242 35:36 PM 1.69 (H) 0.61 - 1.24 mg/dL Final  14/43/1540 08:67 AM 1.46 (H) 0.61 - 1.24 mg/dL Final  61/95/0932 67:12 AM 1.42 (H) 0.61 - 1.24 mg/dL Final  45/80/9983 38:25 AM 1.56 (H) 0.61 - 1.24 mg/dL Final  05/39/7673 41:93 PM 1.72 (H) 0.61 - 1.24 mg/dL Final  79/07/4095 35:32 PM 1.75 (H) 0.61 - 1.24 mg/dL Final  99/24/2683 41:96 AM 1.42 (H) 0.61 - 1.24 mg/dL Final  22/29/7989 21:19 AM 1.53 (H) 0.61 - 1.24 mg/dL Final  41/74/0814 48:18 PM 1.39 (H) 0.61 - 1.24 mg/dL Final    CBC: Recent Labs  Lab 09/06/23 1428 09/07/23 0502  WBC 26.8* 17.1*  HGB 15.2 12.6*  HCT 45.2 38.0*  MCV 94.8 95.2  PLT 183 111*    Microbiology: Results for orders placed or performed during the hospital encounter of 09/06/23  Blood Culture (routine x 2)     Status: None (Preliminary result)   Collection Time: 09/06/23  3:53 PM   Specimen: BLOOD RIGHT ARM  Result Value Ref Range Status   Specimen Description BLOOD RIGHT ARM  Final   Special Requests   Final    BOTTLES DRAWN AEROBIC AND ANAEROBIC Blood Culture results may not be optimal due to an inadequate volume of blood received in culture bottles   Culture   Final    NO GROWTH < 24 HOURS Performed at [facility], [address] [place], [place] [address]    Report Status PENDING  Incomplete  Blood Culture (routine x 2)     Status: None (Preliminary result)   Collection Time: 09/06/23  3:53 PM   Specimen: BLOOD  Result Value Ref Range Status   Specimen Description BLOOD LEFT ANTECUBITAL  Final   Special Requests   Final    BOTTLES DRAWN AEROBIC AND ANAEROBIC Blood Culture adequate volume   Culture   Final    NO GROWTH < 24 HOURS Performed at [facility], [address] [place], [place] [address]    Report Status PENDING  Incomplete  Resp panel by RT-PCR  (RSV, Flu A&B, Covid) Anterior Nasal Swab     Status: None   Collection Time: 09/06/23  5:01 PM   Specimen: Anterior Nasal Swab  Result Value Ref Range Status   SARS Coronavirus 2 by RT PCR NEGATIVE NEGATIVE Final    Comment: (NOTE) SARS-CoV-2  target nucleic acids are NOT DETECTED.  The SARS-CoV-2 RNA is generally detectable in upper respiratory specimens during the acute phase of infection. The lowest concentration of SARS-CoV-2 viral copies this assay can detect is 138 copies/mL. A negative result does not preclude SARS-Cov-2 infection and should not be used as the sole basis for treatment or other patient management decisions. A negative result may occur with  improper specimen collection/handling, submission of specimen other than nasopharyngeal swab, presence of viral mutation(s) within the areas targeted by this assay, and inadequate number of viral copies(<138 copies/mL). A negative result must be combined with clinical observations, patient history, and epidemiological information. The expected result is Negative.  Fact Sheet for Patients:  BloggerCourse.com  Fact Sheet for Healthcare Providers:  SeriousBroker.it  This test is no t yet approved or cleared by the [place] FDA and  has been authorized for detection and/or diagnosis of SARS-CoV-2 by FDA under an Emergency Use Authorization (EUA). This EUA will remain  in effect (meaning this test can be used) for the duration of the COVID-19 declaration under Section 564(b)(1) of the Act, 21 U.S.C.section 360bbb-3(b)(1), unless the authorization is terminated  or revoked sooner.       Influenza A by PCR NEGATIVE NEGATIVE Final   Influenza B by PCR NEGATIVE NEGATIVE Final    Comment: (NOTE) The Xpert Xpress SARS-CoV-2/FLU/RSV plus assay is intended as an aid in the diagnosis of influenza from Nasopharyngeal swab specimens and should not be used as a sole basis for  treatment. Nasal washings and aspirates are unacceptable for Xpert Xpress SARS-CoV-2/FLU/RSV testing.  Fact Sheet for Patients: BloggerCourse.com  Fact Sheet for Healthcare Providers: SeriousBroker.it  This test is not yet approved or cleared by the [place] FDA and has been authorized for detection and/or diagnosis of SARS-CoV-2 by FDA under an Emergency Use Authorization (EUA). This EUA will remain in effect (meaning this test can be used) for the duration of the COVID-19 declaration under Section 564(b)(1) of the Act, 21 U.S.C. section 360bbb-3(b)(1), unless the authorization is terminated or revoked.     Resp Syncytial Virus by PCR NEGATIVE NEGATIVE Final    Comment: (NOTE) Fact Sheet for Patients: BloggerCourse.com  Fact Sheet for Healthcare Providers: SeriousBroker.it  This test is not yet approved or cleared by the [place] FDA and has been authorized for detection and/or diagnosis of SARS-CoV-2 by FDA under an Emergency Use Authorization (EUA). This EUA will remain in effect (meaning this test can be used) for the duration of the COVID-19 declaration under Section 564(b)(1) of the Act, 21 U.S.C. section 360bbb-3(b)(1), unless the authorization is terminated or revoked.  Performed at [facility], [address] [place], [place] [address]     Urinalysis: Recent Labs    09/06/23 1847  COLORURINE YELLOW*  LABSPEC 1.017  PHURINE 5.0  GLUCOSEU NEGATIVE  HGBUR NEGATIVE  BILIRUBINUR NEGATIVE  KETONESUR NEGATIVE  PROTEINUR NEGATIVE  NITRITE NEGATIVE  LEUKOCYTESUR NEGATIVE     Imaging:  CT CHEST ABDOMEN PELVIS W CONTRAST Result Date: 09/06/2023 CLINICAL DATA:  Sepsis lower abd pain, sepsis, RLQ pain, concern for perirectal abscess EXAM: CT CHEST, ABDOMEN, AND PELVIS WITH CONTRAST TECHNIQUE: Multidetector CT imaging of the chest, abdomen and  pelvis was performed following the standard protocol during bolus administration of intravenous contrast. RADIATION DOSE REDUCTION: This exam was performed according to the departmental dose-optimization program which includes automated exposure control, adjustment of the mA and/or kV according to patient size and/or use of iterative reconstruction technique. CONTRAST:  75mL OMNIPAQUE IOHEXOL 300 MG/ML  SOLN COMPARISON:  None Available. FINDINGS: CT CHEST FINDINGS Cardiovascular: Normal heart size. No significant pericardial effusion. The thoracic aorta is normal in caliber. Mild atherosclerotic plaque of the thoracic aorta. Left anterior descending coronary artery calcifications versus possible stent-limited evaluation due to motion artifact. Right main coronary artery calcification. Mediastinum/Nodes: No enlarged mediastinal, hilar, or axillary lymph nodes. Thyroid gland, trachea, and esophagus demonstrate no significant findings. Lungs/Pleura: Limited evaluation due to motion artifact. No focal consolidation. No pulmonary nodule. No pulmonary mass. No pleural effusion. No pneumothorax. Musculoskeletal: No chest wall abnormality. No suspicious lytic or blastic osseous lesions. No acute displaced fracture. CT ABDOMEN PELVIS FINDINGS Hepatobiliary: No focal liver abnormality. Status post cholecystectomy. No biliary dilatation. Pancreas: Diffusely atrophic. No focal lesion. Otherwise normal pancreatic contour. No surrounding inflammatory changes. No main pancreatic ductal dilatation. Spleen: Normal in size without focal abnormality. Adrenals/Urinary Tract: No adrenal nodule bilaterally. Bilateral renal cortical scarring. Bilateral kidneys enhance symmetrically. Fluid density lesions likely represent simple renal cysts. Simple renal cysts, in the absence of clinically indicated signs/symptoms, require no independent follow-up. Subcentimeter hypodensity too small to characterize-no further follow-up indicated. No  hydronephrosis. No hydroureter. Punctate left nephrolithiasis. No right nephrolithiasis. No ureterolithiasis bilaterally. The urinary bladder is unremarkable. No excretion of intravenous contrast from the kidneys on delayed view. Stomach/Bowel: Stomach is within normal limits. No evidence of bowel wall thickening or dilatation. Colonic diverticulosis. Possible tiny perianal foci of gas with no organized fluid collection. Please note the inferior-most portion of the gluteal clefts and perineal regions are not visualized due to collimation off view. Status post appendectomy. Vascular/Lymphatic: No abdominal aorta or iliac aneurysm. Moderate atherosclerotic plaque of the aorta and its branches. No abdominal, pelvic, or inguinal lymphadenopathy. Reproductive: Prostate is unremarkable. Other: No intraperitoneal free fluid. No intraperitoneal free gas. No organized fluid collection. Musculoskeletal: Tiny fat containing umbilical hernia. No suspicious lytic or blastic osseous lesions. No acute displaced fracture. L4-S1 posterolateral surgical hardware. No CT evidence of surgical hardware complication. IMPRESSION: 1. No acute intrathoracic, intra-abdominal, intrapelvic abnormality. 2. Possible tiny perianal foci of gas with no organized fluid collection. Please note the inferior-most portion of the gluteal clefts and perineal regions are not visualized due to collimation off view. 3. Nonobstructive punctate left nephrolithiasis. 4. Colonic diverticulosis with no acute diverticulitis. 5. No excretion of intravenous contrast from the kidneys on delayed view. Recommend correlation with renal function tests. 6.  Aortic Atherosclerosis (ICD10-I70.0). Electronically Signed   By: Tish Frederickson M.D.   On: 09/06/2023 17:30   CT Head Wo Contrast Result Date: 09/06/2023 CLINICAL DATA:  Headache, new onset (Age >= 51y) EXAM: CT HEAD WITHOUT CONTRAST TECHNIQUE: Contiguous axial images were obtained from the base of the skull  through the vertex without intravenous contrast. RADIATION DOSE REDUCTION: This exam was performed according to the departmental dose-optimization program which includes automated exposure control, adjustment of the mA and/or kV according to patient size and/or use of iterative reconstruction technique. COMPARISON:  CT head 04/18/2023 FINDINGS: Brain: Patchy and confluent areas of decreased attenuation are noted throughout the deep and periventricular white matter of the cerebral hemispheres bilaterally, compatible with chronic microvascular ischemic disease. No evidence of large-territorial acute infarction. No parenchymal hemorrhage. No mass lesion. No extra-axial collection. No mass effect or midline shift. No hydrocephalus. Basilar cisterns are patent. Vascular: No hyperdense vessel. Atherosclerotic calcifications are present within the cavernous internal carotid and vertebral arteries. Skull: No acute fracture or focal lesion. Sinuses/Orbits: Right sphenoid sinus mucosal thickening. Paranasal  sinuses and mastoid air cells are clear. The orbits are unremarkable. Other: None. IMPRESSION: No acute intracranial abnormality. Electronically Signed   By: Tish Frederickson M.D.   On: 09/06/2023 17:21     Assessment & Plan:  68 y.o. male with a PMHx of hypertension, coronary artery disease, congestive heart failure, diabetes, BPH, history of CVA, history of chronic kidney disease stage IIIa and a history of liver cirrhosis s/p transplant in 2009.  Patient now being admitted with history of diarrhea, abdominal pain and syncope/presyncopal episodes.  Patient required CT scan of the abdomen and pelvis with IV contrast to rule out any acute abdominal process. He has been on mycophenolate, tacrolimus and prednisone. He is also on Bactrim, furosemide and ACE inhibitor's.  Principal Problem:   Sepsis due to undetermined organism [facility]) Active Problems:   Acute kidney injury superimposed on chronic kidney disease  (HCC)   Near syncope   Essential hypertension   Type 2 diabetes mellitus with peripheral neuropathy (HCC)   Gout   Dyslipidemia  #1: Acute kidney injury on chronic kidney disease: Patient has baseline creatinine 1.6.  Now the creatinine has gone up to 2.6.  This may be secondary to prerenal azotemia.  Patient also received IV contrast yesterday.  Will continue to monitor renal indices closely.  #2: Sepsis/Perirectal abscess and diarrhea: Continue present medications including Rocephin and Flagyl.  Continue IV fluids.  We can discontinue the mycophenolate for now and restart back after diarrhea resolves.  #3: History of liver transplant: Can continue tacrolimus and prednisone.  Will hold the mycophenolate until diarrhea resolves.  #4: Hypertension: I agree to hold the lisinopril and continue amlodipine.  #5: Diabetes: Continue insulin as ordered.  Labs and medications reviewed. Will continue to follow along with you.  LOS: 1 Lorain Childes, MD [facility]. 3/23/20253:56 PM

## 2023-09-08 DIAGNOSIS — K610 Anal abscess: Secondary | ICD-10-CM

## 2023-09-08 DIAGNOSIS — A419 Sepsis, unspecified organism: Secondary | ICD-10-CM | POA: Diagnosis not present

## 2023-09-08 LAB — BASIC METABOLIC PANEL
Anion gap: 9 (ref 5–15)
BUN: 27 mg/dL — ABNORMAL HIGH (ref 8–23)
CO2: 22 mmol/L (ref 22–32)
Calcium: 8.5 mg/dL — ABNORMAL LOW (ref 8.9–10.3)
Chloride: 108 mmol/L (ref 98–111)
Creatinine, Ser: 2.29 mg/dL — ABNORMAL HIGH (ref 0.61–1.24)
GFR, Estimated: 31 mL/min — ABNORMAL LOW (ref >60)
Glucose, Bld: 97 mg/dL (ref 70–99)
Potassium: 4 mmol/L (ref 3.5–5.1)
Sodium: 139 mmol/L (ref 135–145)

## 2023-09-08 LAB — GLUCOSE, CAPILLARY
Glucose-Capillary: 88 mg/dL (ref 70–99)
Glucose-Capillary: 196 mg/dL — ABNORMAL HIGH (ref 70–99)
Glucose-Capillary: 206 mg/dL — ABNORMAL HIGH (ref 70–99)
Glucose-Capillary: 151 mg/dL — ABNORMAL HIGH (ref 70–99)

## 2023-09-08 LAB — URINALYSIS, ROUTINE W REFLEX MICROSCOPIC
Bilirubin Urine: NEGATIVE
Glucose, UA: NEGATIVE mg/dL
Hgb urine dipstick: NEGATIVE
Ketones, ur: NEGATIVE mg/dL
Leukocytes,Ua: NEGATIVE
Nitrite: NEGATIVE
Protein, ur: NEGATIVE mg/dL
Specific Gravity, Urine: 1.012 (ref 1.005–1.030)
pH: 5 (ref 5.0–8.0)

## 2023-09-08 LAB — CBC
HCT: 34.6 % — ABNORMAL LOW (ref 39.0–52.0)
Hemoglobin: 12 g/dL — ABNORMAL LOW (ref 13.0–17.0)
MCH: 32.4 pg (ref 26.0–34.0)
MCHC: 34.7 g/dL (ref 30.0–36.0)
MCV: 93.5 fL (ref 80.0–100.0)
Platelets: 107 10*3/uL — ABNORMAL LOW (ref 150–400)
RBC: 3.7 MIL/uL — ABNORMAL LOW (ref 4.22–5.81)
RDW: 14.6 % (ref 11.5–15.5)
WBC: 12.3 10*3/uL — ABNORMAL HIGH (ref 4.0–10.5)
nRBC: 0 % (ref 0.0–0.2)

## 2023-09-08 MED ORDER — LACTATED RINGERS IV SOLN: INTRAVENOUS | Status: AC

## 2023-09-08 MED ORDER — OXYCODONE HCL 5 MG PO TABS
2.5000 mg | ORAL_TABLET | ORAL | Status: DC | PRN
  Administered 2023-09-08 – 2023-09-09 (×4): 5 mg via ORAL
  Filled 2023-09-08 (×4): qty 1

## 2023-09-08 MED ORDER — TAMSULOSIN HCL 0.4 MG PO CAPS
0.8000 mg | ORAL_CAPSULE | Freq: Every day | ORAL | Status: DC
  Administered 2023-09-09 – 2023-09-14 (×6): 0.8 mg via ORAL
  Filled 2023-09-08 (×6): qty 2

## 2023-09-08 MED ORDER — BOOST / RESOURCE BREEZE PO LIQD CUSTOM
1.0000 | Freq: Three times a day (TID) | ORAL | Status: DC
  Administered 2023-09-08 – 2023-09-13 (×14): 1 via ORAL

## 2023-09-08 NOTE — Progress Notes (Signed)
 09/08/2023  Subjective: Patient reports that the right medial buttocks pain continues and has not significantly improved.  His WBC has improved down to 12.3 and he has been afebrile.  Has not had further BM yesterday so C-diff testing still pending.  Vital signs: Temp:  [98 F (36.7 C)-99.1 F (37.3 C)] 99.1 F (37.3 C) (03/24 0728) Pulse Rate:  [86-96] 91 (03/24 0728) Resp:  [16-17] 16 (03/24 0728) BP: (103-135)/(64-75) 113/69 (03/24 0728) SpO2:  [93 %-96 %] 93 % (03/24 0728)   Intake/Output: 03/23 0701 - 03/24 0700 In: 2456.3 [P.O.:1140; I.V.:1216.3; IV Piggyback:100] Out: 2650 [Urine:2650] Last BM Date : 09/06/23  Physical Exam: Constitutional:  No acute distress Butocks:  Right medial buttocks area of palpable firmness is actually softer today, again without any associated erythema or significant induration.  No drainage or ulceration either.  Remains tender on palpation.  Labs:  Recent Labs    09/07/23 0502 09/08/23 0622  WBC 17.1* 12.3*  HGB 12.6* 12.0*  HCT 38.0* 34.6*  PLT 111* 107*   Recent Labs    09/07/23 0502 09/08/23 0622  NA 139 139  K 4.5 4.0  CL 109 108  CO2 24 22  GLUCOSE 89 97  BUN 35* 27*  CREATININE 2.28* 2.29*  CALCIUM 8.5* 8.5*   Recent Labs    09/07/23 0502  LABPROT 15.2  INR 1.2    Imaging: No results found.  Assessment/Plan: This is a 68 y.o. male with right perianal abscess/phlegmon  --Patient reports his pain has not really improved but clinically he is doing better, with improved WBC and no fevers, and on exam, the area is softer.  For now would continue current treatment plan with IV antibiotics.  However, if tomorrow he were to continue having the same type/intensity of pain or any clinical worsening, would repeat imaging to further evaluate.   I spent 35 minutes dedicated to the care of this patient on the date of this encounter to include pre-visit review of records, face-to-face time with the patient discussing diagnosis  and management, and any post-visit coordination of care.  Howie Ill, MD [facility]

## 2023-09-08 NOTE — Progress Notes (Signed)
 [facility]  ROUNDING NOTE   Subjective:   Patient seen sitting up in bed Denies pain or discomfort Is followed by [facility]  Creatinine 2.29 Urine output in past 24 hours  Objective:  Vital signs in last 24 hours:  Temp:  [98 F (36.7 C)-99.1 F (37.3 C)] 99.1 F (37.3 C) (03/24 0728) Pulse Rate:  [86-96] 91 (03/24 0728) Resp:  [16-17] 16 (03/24 0728) BP: (103-135)/(64-75) 113/69 (03/24 0728) SpO2:  [93 %-96 %] 93 % (03/24 0728)  Weight change:  Filed Weights   09/06/23 1425 09/06/23 2014  Weight: 117.9 kg 120.6 kg    Intake/Output: I/O last 3 completed shifts: In: 2456.3 [P.O.:1140; I.V.:1216.3; IV Piggyback:100] Out: 2650 [Urine:2650]   Intake/Output this shift:  Total I/O In: 240 [P.O.:240] Out: -   Physical Exam: General: NAD  Head: Normocephalic, atraumatic. Moist oral mucosal membranes  Eyes: Anicteric  Lungs:  Clear to auscultation, normal effort  Heart: Regular rate and rhythm  Abdomen:  Soft, nontender,   Extremities:  NO peripheral edema.  Neurologic: Nonfocal, moving all four extremities  Skin: No lesions   Basic Metabolic Panel: Recent Labs  Lab 09/06/23 1428 09/07/23 0502 09/08/23 0622  NA 134* 139 139  K 4.8 4.5 4.0  CL 109 109 108  CO2 20* 24 22  GLUCOSE 172* 89 97  BUN 37* 35* 27*  CREATININE 2.67* 2.28* 2.29*  CALCIUM 9.0 8.5* 8.5*    Liver Function Tests: Recent Labs  Lab 09/06/23 1428  AST 23  ALT 22  ALKPHOS 37*  BILITOT 2.7*  PROT 6.8  ALBUMIN 4.0   Recent Labs  Lab 09/06/23 1428  LIPASE 28   No results for input(s): "AMMONIA" in the last 168 hours.  CBC: Recent Labs  Lab 09/06/23 1428 09/07/23 0502 09/08/23 0622  WBC 26.8* 17.1* 12.3*  HGB 15.2 12.6* 12.0*  HCT 45.2 38.0* 34.6*  MCV 94.8 95.2 93.5  PLT 183 111* 107*    Cardiac Enzymes: No results for input(s): "CKTOTAL", "CKMB", "CKMBINDEX", "TROPONINI" in the last 168 hours.  BNP: Invalid input(s):  "POCBNP"  CBG: Recent Labs  Lab 09/07/23 1108 09/07/23 1637 09/07/23 2137 09/08/23 0728 09/08/23 1202  GLUCAP 102* 103* 117* 88 196*    Microbiology: Results for orders placed or performed during the hospital encounter of 09/06/23  Blood Culture (routine x 2)     Status: None (Preliminary result)   Collection Time: 09/06/23  3:53 PM   Specimen: BLOOD RIGHT ARM  Result Value Ref Range Status   Specimen Description BLOOD RIGHT ARM  Final   Special Requests   Final    BOTTLES DRAWN AEROBIC AND ANAEROBIC Blood Culture results may not be optimal due to an inadequate volume of blood received in culture bottles   Culture   Final    NO GROWTH 2 DAYS Performed at [facility], [address] [place], [place] [address]    Report Status PENDING  Incomplete  Blood Culture (routine x 2)     Status: None (Preliminary result)   Collection Time: 09/06/23  3:53 PM   Specimen: BLOOD  Result Value Ref Range Status   Specimen Description BLOOD LEFT ANTECUBITAL  Final   Special Requests   Final    BOTTLES DRAWN AEROBIC AND ANAEROBIC Blood Culture adequate volume   Culture   Final    NO GROWTH 2 DAYS Performed at [facility], [address] [place], [place] [address]    Report Status PENDING  Incomplete  Resp panel by RT-PCR (RSV, Flu A&B, Covid) Anterior Nasal Swab     Status: None   Collection Time: 09/06/23  5:01 PM   Specimen: Anterior Nasal Swab  Result Value Ref Range Status   SARS Coronavirus 2 by RT PCR NEGATIVE NEGATIVE Final    Comment: (NOTE) SARS-CoV-2 target nucleic acids are NOT DETECTED.  The SARS-CoV-2 RNA is generally detectable in upper respiratory specimens during the acute phase of infection. The lowest concentration of SARS-CoV-2 viral copies this assay can detect is 138 copies/mL. A negative result does not preclude SARS-Cov-2 infection and should not be used as the sole basis for treatment or other patient management decisions. A negative  result may occur with  improper specimen collection/handling, submission of specimen other than nasopharyngeal swab, presence of viral mutation(s) within the areas targeted by this assay, and inadequate number of viral copies(<138 copies/mL). A negative result must be combined with clinical observations, patient history, and epidemiological information. The expected result is Negative.  Fact Sheet for Patients:  BloggerCourse.com  Fact Sheet for Healthcare Providers:  SeriousBroker.it  This test is no t yet approved or cleared by the [place] FDA and  has been authorized for detection and/or diagnosis of SARS-CoV-2 by FDA under an Emergency Use Authorization (EUA). This EUA will remain  in effect (meaning this test can be used) for the duration of the COVID-19 declaration under Section 564(b)(1) of the Act, 21 U.S.C.section 360bbb-3(b)(1), unless the authorization is terminated  or revoked sooner.       Influenza A by PCR NEGATIVE NEGATIVE Final   Influenza B by PCR NEGATIVE NEGATIVE Final    Comment: (NOTE) The Xpert Xpress SARS-CoV-2/FLU/RSV plus assay is intended as an aid in the diagnosis of influenza from Nasopharyngeal swab specimens and should not be used as a sole basis for treatment. Nasal washings and aspirates are unacceptable for Xpert Xpress SARS-CoV-2/FLU/RSV testing.  Fact Sheet for Patients: BloggerCourse.com  Fact Sheet for Healthcare Providers: SeriousBroker.it  This test is not yet approved or cleared by the [place] FDA and has been authorized for detection and/or diagnosis of SARS-CoV-2 by FDA under an Emergency Use Authorization (EUA). This EUA will remain in effect (meaning this test can be used) for the duration of the COVID-19 declaration under Section 564(b)(1) of the Act, 21 U.S.C. section 360bbb-3(b)(1), unless the authorization is  terminated or revoked.     Resp Syncytial Virus by PCR NEGATIVE NEGATIVE Final    Comment: (NOTE) Fact Sheet for Patients: BloggerCourse.com  Fact Sheet for Healthcare Providers: SeriousBroker.it  This test is not yet approved or cleared by the [place] FDA and has been authorized for detection and/or diagnosis of SARS-CoV-2 by FDA under an Emergency Use Authorization (EUA). This EUA will remain in effect (meaning this test can be used) for the duration of the COVID-19 declaration under Section 564(b)(1) of the Act, 21 U.S.C. section 360bbb-3(b)(1), unless the authorization is terminated or revoked.  Performed at [facility], [address] [place], [place] [address]     Coagulation Studies: Recent Labs    09/07/23 0502  LABPROT 15.2  INR 1.2    Urinalysis: Recent Labs    09/06/23 1847  COLORURINE YELLOW*  LABSPEC 1.017  PHURINE 5.0  GLUCOSEU NEGATIVE  HGBUR NEGATIVE  BILIRUBINUR NEGATIVE  KETONESUR NEGATIVE  PROTEINUR NEGATIVE  NITRITE NEGATIVE  LEUKOCYTESUR NEGATIVE      Imaging: CT CHEST ABDOMEN PELVIS W CONTRAST Result Date: 09/06/2023 CLINICAL DATA:  Sepsis lower  abd pain, sepsis, RLQ pain, concern for perirectal abscess EXAM: CT CHEST, ABDOMEN, AND PELVIS WITH CONTRAST TECHNIQUE: Multidetector CT imaging of the chest, abdomen and pelvis was performed following the standard protocol during bolus administration of intravenous contrast. RADIATION DOSE REDUCTION: This exam was performed according to the departmental dose-optimization program which includes automated exposure control, adjustment of the mA and/or kV according to patient size and/or use of iterative reconstruction technique. CONTRAST:  75mL OMNIPAQUE IOHEXOL 300 MG/ML  SOLN COMPARISON:  None Available. FINDINGS: CT CHEST FINDINGS Cardiovascular: Normal heart size. No significant pericardial effusion. The thoracic aorta is normal in  caliber. Mild atherosclerotic plaque of the thoracic aorta. Left anterior descending coronary artery calcifications versus possible stent-limited evaluation due to motion artifact. Right main coronary artery calcification. Mediastinum/Nodes: No enlarged mediastinal, hilar, or axillary lymph nodes. Thyroid gland, trachea, and esophagus demonstrate no significant findings. Lungs/Pleura: Limited evaluation due to motion artifact. No focal consolidation. No pulmonary nodule. No pulmonary mass. No pleural effusion. No pneumothorax. Musculoskeletal: No chest wall abnormality. No suspicious lytic or blastic osseous lesions. No acute displaced fracture. CT ABDOMEN PELVIS FINDINGS Hepatobiliary: No focal liver abnormality. Status post cholecystectomy. No biliary dilatation. Pancreas: Diffusely atrophic. No focal lesion. Otherwise normal pancreatic contour. No surrounding inflammatory changes. No main pancreatic ductal dilatation. Spleen: Normal in size without focal abnormality. Adrenals/Urinary Tract: No adrenal nodule bilaterally. Bilateral renal cortical scarring. Bilateral kidneys enhance symmetrically. Fluid density lesions likely represent simple renal cysts. Simple renal cysts, in the absence of clinically indicated signs/symptoms, require no independent follow-up. Subcentimeter hypodensity too small to characterize-no further follow-up indicated. No hydronephrosis. No hydroureter. Punctate left nephrolithiasis. No right nephrolithiasis. No ureterolithiasis bilaterally. The urinary bladder is unremarkable. No excretion of intravenous contrast from the kidneys on delayed view. Stomach/Bowel: Stomach is within normal limits. No evidence of bowel wall thickening or dilatation. Colonic diverticulosis. Possible tiny perianal foci of gas with no organized fluid collection. Please note the inferior-most portion of the gluteal clefts and perineal regions are not visualized due to collimation off view. Status post  appendectomy. Vascular/Lymphatic: No abdominal aorta or iliac aneurysm. Moderate atherosclerotic plaque of the aorta and its branches. No abdominal, pelvic, or inguinal lymphadenopathy. Reproductive: Prostate is unremarkable. Other: No intraperitoneal free fluid. No intraperitoneal free gas. No organized fluid collection. Musculoskeletal: Tiny fat containing umbilical hernia. No suspicious lytic or blastic osseous lesions. No acute displaced fracture. L4-S1 posterolateral surgical hardware. No CT evidence of surgical hardware complication. IMPRESSION: 1. No acute intrathoracic, intra-abdominal, intrapelvic abnormality. 2. Possible tiny perianal foci of gas with no organized fluid collection. Please note the inferior-most portion of the gluteal clefts and perineal regions are not visualized due to collimation off view. 3. Nonobstructive punctate left nephrolithiasis. 4. Colonic diverticulosis with no acute diverticulitis. 5. No excretion of intravenous contrast from the kidneys on delayed view. Recommend correlation with renal function tests. 6.  Aortic Atherosclerosis (ICD10-I70.0). Electronically Signed   By: Tish Frederickson M.D.   On: 09/06/2023 17:30   CT Head Wo Contrast Result Date: 09/06/2023 CLINICAL DATA:  Headache, new onset (Age >= 51y) EXAM: CT HEAD WITHOUT CONTRAST TECHNIQUE: Contiguous axial images were obtained from the base of the skull through the vertex without intravenous contrast. RADIATION DOSE REDUCTION: This exam was performed according to the departmental dose-optimization program which includes automated exposure control, adjustment of the mA and/or kV according to patient size and/or use of iterative reconstruction technique. COMPARISON:  CT head 04/18/2023 FINDINGS: Brain: Patchy and confluent areas of decreased attenuation are noted  throughout the deep and periventricular white matter of the cerebral hemispheres bilaterally, compatible with chronic microvascular ischemic disease. No  evidence of large-territorial acute infarction. No parenchymal hemorrhage. No mass lesion. No extra-axial collection. No mass effect or midline shift. No hydrocephalus. Basilar cisterns are patent. Vascular: No hyperdense vessel. Atherosclerotic calcifications are present within the cavernous internal carotid and vertebral arteries. Skull: No acute fracture or focal lesion. Sinuses/Orbits: Right sphenoid sinus mucosal thickening. Paranasal sinuses and mastoid air cells are clear. The orbits are unremarkable. Other: None. IMPRESSION: No acute intracranial abnormality. Electronically Signed   By: Tish Frederickson M.D.   On: 09/06/2023 17:21     Medications:    cefTRIAXone (ROCEPHIN)  IV Stopped (09/08/23 0210)   lactated ringers 100 mL/hr at 09/08/23 1141   metronidazole 500 mg (09/08/23 0830)    amLODipine  10 mg Oral Daily   aspirin EC  81 mg Oral Daily   enoxaparin (LOVENOX) injection  0.5 mg/kg Subcutaneous Q24H   feeding supplement  1 Container Oral TID BM   gabapentin  300 mg Oral TID   insulin aspart  0-15 Units Subcutaneous TID WC   insulin aspart  0-5 Units Subcutaneous QHS   methocarbamol  500 mg Oral BID   nortriptyline  10 mg Oral BID   pantoprazole  40 mg Oral Daily   predniSONE  10 mg Oral Q breakfast   rosuvastatin  20 mg Oral Daily   tacrolimus  0.5 mg Oral q morning   tacrolimus  1 mg Oral QHS   [START ON 09/09/2023] tamsulosin  0.8 mg Oral Daily   trimethoprim  100 mg Oral Daily   acetaminophen **OR** acetaminophen, calcium carbonate, magnesium hydroxide, ondansetron **OR** ondansetron (ZOFRAN) IV, oxyCODONE, traZODone  Assessment/ Plan:  Mr. SHAE AUGELLO is a 68 y.o.  male with a PMHx of hypertension, coronary artery disease, congestive heart failure, diabetes, BPH, history of CVA, history of chronic kidney disease stage IIIa and a history of liver cirrhosis s/p transplant in 2009.  Patient now being admitted with history of diarrhea, abdominal pain and  syncope/presyncopal episodes.  Patient required CT scan of the abdomen and pelvis with IV contrast to rule out any acute abdominal process. He has been on mycophenolate, tacrolimus and prednisone. He is also on Bactrim, furosemide and ACE inhibitor's.  Acute kidney injury on chronic kidney disease: Patient has baseline creatinine 1.6. Now the creatinine has gone up to 2.6. This may be secondary to prerenal azotemia. Patient also received IV contrast.  Creatinine appears stable today. Adequate output recorded. Patient request to follow up with nephrology in town due to transportation concerns. Will schedule follow up in our office at discharge.   Lab Results  Component Value Date   CREATININE 2.29 (H) 09/08/2023   CREATININE 2.28 (H) 09/07/2023   CREATININE 2.67 (H) 09/06/2023    Intake/Output Summary (Last 24 hours) at 09/08/2023 1343 Last data filed at 09/08/2023 0900 Gross per 24 hour  Intake 900 ml  Output 1850 ml  Net -950 ml    #2: Sepsis/Perirectal abscess and diarrhea: Continue present medications including Rocephin,  and Flagyl.   Remains on IV fluids at this time.     #3: History of liver transplant: Can continue tacrolimus and prednisone.  Will hold the mycophenolate until diarrhea resolves.   #4: Hypertension: Remains on amlodipine.  Lisinopril held      LOS: 2 Devin White 3/24/20251:43 PM

## 2023-09-08 NOTE — Progress Notes (Signed)
 PROGRESS NOTE    Devin White  BMW:[id number] DOB: Nov 03, 1955 DOA: 09/06/2023 PCP: Karie Schwalbe, MD    Brief Narrative:  68 y.o. male with medical history significant for liver cirrhosis and hepatic cell failure status post liver transplant in 2009 on immunosuppressive therapy, CHF, GERD, BPH, essential hypertension, CVA, stage IIIa chronic kidney disease, gout, and osteoarthritis, who presented to the emergency room with acute onset of presyncope.  He stated that he has been feeling sick for couple weeks.  He did have a syncopal episode about a couple weeks ago.  He has not been feeling well since then generalized weakness and fatigue intermittently.  Last week he had recurrent diarrhea for about 17 episodes.  He denies any melena or bright red bleeding per rectum.  Started having worsening abdominal pain today more on the right side and noticed a small area near his rectum that has significant increased in size over the last couple of days.  He admitted to tactile fever and chills.  He has been having dry cough with no dyspnea or wheezing.  No chest pain or palpitations.  No nausea or vomiting.  Denies any trauma or injuries but was having headache.  He has mild dysuria without hematuria urgency or frequency or flank pain.    Assessment & Plan:   Principal Problem:   Sepsis due to undetermined organism [facility]) Active Problems:   Near syncope   Acute kidney injury superimposed on chronic kidney disease (HCC)   Type 2 diabetes mellitus with peripheral neuropathy (HCC)   Essential hypertension   Gout   Dyslipidemia   Perianal abscess   * Sepsis due to undetermined organism (HCC) Suspect GI pathogen Possible perirectal abscess Patient has a history of C. difficile colitis.  Last course was approximately 2 years ago.  No recent exposure to antibiotics however the patient is status post liver transplant on chronic immunosuppression.  Case discussed with general surgery regarding  possible perirectal abscess.  Recommends empiric antibiotic therapy.  No recommendations for surgical intervention at this time. - Patient has not been having diarrhea Plan: Check C. difficile and GI PCR if diarrhea recurs Continue contact isolation Continue IV Rocephin and Flagyl for now Continue IV fluids Monitor vitals and fever curve Appreciate surgical followup  Near syncope Suspect intravascular volume depletion in the setting of diarrheal illness/sepsis.   Treatment as above.   IV hydration as above. Engage therapy services  Acute kidney injury superimposed on chronic kidney disease (HCC) Kidney function improving.   Continue intravenous fluid resuscitation.   Avoid nonessential nephrotoxins.   Type 2 diabetes mellitus with peripheral neuropathy (HCC) Hold oral agents.   Hold long-acting for now.   Sliding scale coverage.   Carb modified diet.   Continue Neurontin.   Essential hypertension Continue amlodipine.   Hold lisinopril   Dyslipidemia Continue statin   Gout No evidence of exacerbation  Status post liver transplant Chronic immunosuppression Resume immunosuppressive therapy Cellcept temporarily on hold  Obesity BMI 35.08.   Complicating factor in overall care and prognosis.   DVT prophylaxis: Lovenox Code Status: Full Family Communication: None Disposition Plan: Status is: Inpatient Remains inpatient appropriate because: Sepsis, diarrheal illness   Level of care: Telemetry Medical  Consultants:  General surgery  Procedures:  None  Antimicrobials: Rocephin Flagyl   Subjective: Seen and examined.  Reports diffuse pain and fatigue.  Objective: Vitals:   09/07/23 1612 09/07/23 1936 09/08/23 0600 09/08/23 0728  BP: 135/75 103/64 122/70 113/69  Pulse: 86 96  86 91  Resp: 17   16  Temp: 98.8 F (37.1 C) 98 F (36.7 C) 98.2 F (36.8 C) 99.1 F (37.3 C)  TempSrc: Oral  Oral Oral  SpO2: 96% 96% 94% 93%  Weight:      Height:         Intake/Output Summary (Last 24 hours) at 09/08/2023 1128 Last data filed at 09/08/2023 0900 Gross per 24 hour  Intake 2456.29 ml  Output 2650 ml  Net -193.71 ml   Filed Weights   09/06/23 1425 09/06/23 2014  Weight: 117.9 kg 120.6 kg    Examination:  General exam: NAD Respiratory system: Lungs clear.  Normal work of breathing.  Room air Cardiovascular system: S1-S2, RRR, no murmurs, no pedal edema Gastrointestinal system: Obese, soft, mild TTP, nondistended, hyperactive bowel sounds Central nervous system: Alert and oriented. No focal neurological deficits. Extremities: Symmetric 5 x 5 power. Skin: No rashes, lesions or ulcers Psychiatry: Judgement and insight appear normal. Mood & affect appropriate.     Data Reviewed: I have personally reviewed following labs and imaging studies  CBC: Recent Labs  Lab 09/06/23 1428 09/07/23 0502 09/08/23 0622  WBC 26.8* 17.1* 12.3*  HGB 15.2 12.6* 12.0*  HCT 45.2 38.0* 34.6*  MCV 94.8 95.2 93.5  PLT 183 111* 107*   Basic Metabolic Panel: Recent Labs  Lab 09/06/23 1428 09/07/23 0502 09/08/23 0622  NA 134* 139 139  K 4.8 4.5 4.0  CL 109 109 108  CO2 20* 24 22  GLUCOSE 172* 89 97  BUN 37* 35* 27*  CREATININE 2.67* 2.28* 2.29*  CALCIUM 9.0 8.5* 8.5*   GFR: Estimated Creatinine Clearance: 43.8 mL/min (A) (by C-G formula based on SCr of 2.29 mg/dL (H)). Liver Function Tests: Recent Labs  Lab 09/06/23 1428  AST 23  ALT 22  ALKPHOS 37*  BILITOT 2.7*  PROT 6.8  ALBUMIN 4.0   Recent Labs  Lab 09/06/23 1428  LIPASE 28   No results for input(s): "AMMONIA" in the last 168 hours. Coagulation Profile: Recent Labs  Lab 09/07/23 0502  INR 1.2   Cardiac Enzymes: No results for input(s): "CKTOTAL", "CKMB", "CKMBINDEX", "TROPONINI" in the last 168 hours. BNP (last 3 results) No results for input(s): "PROBNP" in the last 8760 hours. HbA1C: No results for input(s): "HGBA1C" in the last 72 hours. CBG: Recent Labs   Lab 09/07/23 0744 09/07/23 1108 09/07/23 1637 09/07/23 2137 09/08/23 0728  GLUCAP 75 102* 103* 117* 88   Lipid Profile: No results for input(s): "CHOL", "HDL", "LDLCALC", "TRIG", "CHOLHDL", "LDLDIRECT" in the last 72 hours. Thyroid Function Tests: No results for input(s): "TSH", "T4TOTAL", "FREET4", "T3FREE", "THYROIDAB" in the last 72 hours. Anemia Panel: No results for input(s): "VITAMINB12", "FOLATE", "FERRITIN", "TIBC", "IRON", "RETICCTPCT" in the last 72 hours. Sepsis Labs: Recent Labs  Lab 09/06/23 1553 09/06/23 2123  LATICACIDVEN 1.8 1.2    Recent Results (from the past 240 hours)  Blood Culture (routine x 2)     Status: None (Preliminary result)   Collection Time: 09/06/23  3:53 PM   Specimen: BLOOD RIGHT ARM  Result Value Ref Range Status   Specimen Description BLOOD RIGHT ARM  Final   Special Requests   Final    BOTTLES DRAWN AEROBIC AND ANAEROBIC Blood Culture results may not be optimal due to an inadequate volume of blood received in culture bottles   Culture   Final    NO GROWTH 2 DAYS Performed at [facility], [address] [place], [place]  [address]    Report Status PENDING  Incomplete  Blood Culture (routine x 2)     Status: None (Preliminary result)   Collection Time: 09/06/23  3:53 PM   Specimen: BLOOD  Result Value Ref Range Status   Specimen Description BLOOD LEFT ANTECUBITAL  Final   Special Requests   Final    BOTTLES DRAWN AEROBIC AND ANAEROBIC Blood Culture adequate volume   Culture   Final    NO GROWTH 2 DAYS Performed at [facility], [address] [place], [place] [address]    Report Status PENDING  Incomplete  Resp panel by RT-PCR (RSV, Flu A&B, Covid) Anterior Nasal Swab     Status: None   Collection Time: 09/06/23  5:01 PM   Specimen: Anterior Nasal Swab  Result Value Ref Range Status   SARS Coronavirus 2 by RT PCR NEGATIVE NEGATIVE Final    Comment: (NOTE) SARS-CoV-2 target nucleic acids are NOT  DETECTED.  The SARS-CoV-2 RNA is generally detectable in upper respiratory specimens during the acute phase of infection. The lowest concentration of SARS-CoV-2 viral copies this assay can detect is 138 copies/mL. A negative result does not preclude SARS-Cov-2 infection and should not be used as the sole basis for treatment or other patient management decisions. A negative result may occur with  improper specimen collection/handling, submission of specimen other than nasopharyngeal swab, presence of viral mutation(s) within the areas targeted by this assay, and inadequate number of viral copies(<138 copies/mL). A negative result must be combined with clinical observations, patient history, and epidemiological information. The expected result is Negative.  Fact Sheet for Patients:  BloggerCourse.com  Fact Sheet for Healthcare Providers:  SeriousBroker.it  This test is no t yet approved or cleared by the [place] FDA and  has been authorized for detection and/or diagnosis of SARS-CoV-2 by FDA under an Emergency Use Authorization (EUA). This EUA will remain  in effect (meaning this test can be used) for the duration of the COVID-19 declaration under Section 564(b)(1) of the Act, 21 U.S.C.section 360bbb-3(b)(1), unless the authorization is terminated  or revoked sooner.       Influenza A by PCR NEGATIVE NEGATIVE Final   Influenza B by PCR NEGATIVE NEGATIVE Final    Comment: (NOTE) The Xpert Xpress SARS-CoV-2/FLU/RSV plus assay is intended as an aid in the diagnosis of influenza from Nasopharyngeal swab specimens and should not be used as a sole basis for treatment. Nasal washings and aspirates are unacceptable for Xpert Xpress SARS-CoV-2/FLU/RSV testing.  Fact Sheet for Patients: BloggerCourse.com  Fact Sheet for Healthcare Providers: SeriousBroker.it  This test is not yet  approved or cleared by the [place] FDA and has been authorized for detection and/or diagnosis of SARS-CoV-2 by FDA under an Emergency Use Authorization (EUA). This EUA will remain in effect (meaning this test can be used) for the duration of the COVID-19 declaration under Section 564(b)(1) of the Act, 21 U.S.C. section 360bbb-3(b)(1), unless the authorization is terminated or revoked.     Resp Syncytial Virus by PCR NEGATIVE NEGATIVE Final    Comment: (NOTE) Fact Sheet for Patients: BloggerCourse.com  Fact Sheet for Healthcare Providers: SeriousBroker.it  This test is not yet approved or cleared by the [place] FDA and has been authorized for detection and/or diagnosis of SARS-CoV-2 by FDA under an Emergency Use Authorization (EUA). This EUA will remain in effect (meaning this test can be used) for the duration of the COVID-19 declaration under Section 564(b)(1) of the Act, 21 U.S.C. section  360bbb-3(b)(1), unless the authorization is terminated or revoked.  Performed at [facility], [address], [place], [place] [address]          Radiology Studies: CT CHEST ABDOMEN PELVIS W CONTRAST Result Date: 09/06/2023 CLINICAL DATA:  Sepsis lower abd pain, sepsis, RLQ pain, concern for perirectal abscess EXAM: CT CHEST, ABDOMEN, AND PELVIS WITH CONTRAST TECHNIQUE: Multidetector CT imaging of the chest, abdomen and pelvis was performed following the standard protocol during bolus administration of intravenous contrast. RADIATION DOSE REDUCTION: This exam was performed according to the departmental dose-optimization program which includes automated exposure control, adjustment of the mA and/or kV according to patient size and/or use of iterative reconstruction technique. CONTRAST:  75mL OMNIPAQUE IOHEXOL 300 MG/ML  SOLN COMPARISON:  None Available. FINDINGS: CT CHEST FINDINGS Cardiovascular: Normal heart size. No  significant pericardial effusion. The thoracic aorta is normal in caliber. Mild atherosclerotic plaque of the thoracic aorta. Left anterior descending coronary artery calcifications versus possible stent-limited evaluation due to motion artifact. Right main coronary artery calcification. Mediastinum/Nodes: No enlarged mediastinal, hilar, or axillary lymph nodes. Thyroid gland, trachea, and esophagus demonstrate no significant findings. Lungs/Pleura: Limited evaluation due to motion artifact. No focal consolidation. No pulmonary nodule. No pulmonary mass. No pleural effusion. No pneumothorax. Musculoskeletal: No chest wall abnormality. No suspicious lytic or blastic osseous lesions. No acute displaced fracture. CT ABDOMEN PELVIS FINDINGS Hepatobiliary: No focal liver abnormality. Status post cholecystectomy. No biliary dilatation. Pancreas: Diffusely atrophic. No focal lesion. Otherwise normal pancreatic contour. No surrounding inflammatory changes. No main pancreatic ductal dilatation. Spleen: Normal in size without focal abnormality. Adrenals/Urinary Tract: No adrenal nodule bilaterally. Bilateral renal cortical scarring. Bilateral kidneys enhance symmetrically. Fluid density lesions likely represent simple renal cysts. Simple renal cysts, in the absence of clinically indicated signs/symptoms, require no independent follow-up. Subcentimeter hypodensity too small to characterize-no further follow-up indicated. No hydronephrosis. No hydroureter. Punctate left nephrolithiasis. No right nephrolithiasis. No ureterolithiasis bilaterally. The urinary bladder is unremarkable. No excretion of intravenous contrast from the kidneys on delayed view. Stomach/Bowel: Stomach is within normal limits. No evidence of bowel wall thickening or dilatation. Colonic diverticulosis. Possible tiny perianal foci of gas with no organized fluid collection. Please note the inferior-most portion of the gluteal clefts and perineal regions are  not visualized due to collimation off view. Status post appendectomy. Vascular/Lymphatic: No abdominal aorta or iliac aneurysm. Moderate atherosclerotic plaque of the aorta and its branches. No abdominal, pelvic, or inguinal lymphadenopathy. Reproductive: Prostate is unremarkable. Other: No intraperitoneal free fluid. No intraperitoneal free gas. No organized fluid collection. Musculoskeletal: Tiny fat containing umbilical hernia. No suspicious lytic or blastic osseous lesions. No acute displaced fracture. L4-S1 posterolateral surgical hardware. No CT evidence of surgical hardware complication. IMPRESSION: 1. No acute intrathoracic, intra-abdominal, intrapelvic abnormality. 2. Possible tiny perianal foci of gas with no organized fluid collection. Please note the inferior-most portion of the gluteal clefts and perineal regions are not visualized due to collimation off view. 3. Nonobstructive punctate left nephrolithiasis. 4. Colonic diverticulosis with no acute diverticulitis. 5. No excretion of intravenous contrast from the kidneys on delayed view. Recommend correlation with renal function tests. 6.  Aortic Atherosclerosis (ICD10-I70.0). Electronically Signed   By: Tish Frederickson M.D.   On: 09/06/2023 17:30   CT Head Wo Contrast Result Date: 09/06/2023 CLINICAL DATA:  Headache, new onset (Age >= 51y) EXAM: CT HEAD WITHOUT CONTRAST TECHNIQUE: Contiguous axial images were obtained from the base of the skull through the vertex without intravenous contrast. RADIATION DOSE REDUCTION: This  exam was performed according to the departmental dose-optimization program which includes automated exposure control, adjustment of the mA and/or kV according to patient size and/or use of iterative reconstruction technique. COMPARISON:  CT head 04/18/2023 FINDINGS: Brain: Patchy and confluent areas of decreased attenuation are noted throughout the deep and periventricular white matter of the cerebral hemispheres bilaterally,  compatible with chronic microvascular ischemic disease. No evidence of large-territorial acute infarction. No parenchymal hemorrhage. No mass lesion. No extra-axial collection. No mass effect or midline shift. No hydrocephalus. Basilar cisterns are patent. Vascular: No hyperdense vessel. Atherosclerotic calcifications are present within the cavernous internal carotid and vertebral arteries. Skull: No acute fracture or focal lesion. Sinuses/Orbits: Right sphenoid sinus mucosal thickening. Paranasal sinuses and mastoid air cells are clear. The orbits are unremarkable. Other: None. IMPRESSION: No acute intracranial abnormality. Electronically Signed   By: Tish Frederickson M.D.   On: 09/06/2023 17:21        Scheduled Meds:  amLODipine  10 mg Oral Daily   aspirin EC  81 mg Oral Daily   enoxaparin (LOVENOX) injection  0.5 mg/kg Subcutaneous Q24H   feeding supplement  1 Container Oral TID BM   gabapentin  300 mg Oral TID   insulin aspart  0-15 Units Subcutaneous TID WC   insulin aspart  0-5 Units Subcutaneous QHS   methocarbamol  500 mg Oral BID   nortriptyline  10 mg Oral BID   pantoprazole  40 mg Oral Daily   predniSONE  10 mg Oral Q breakfast   rosuvastatin  20 mg Oral Daily   tacrolimus  0.5 mg Oral q morning   tacrolimus  1 mg Oral QHS   [START ON 09/09/2023] tamsulosin  0.8 mg Oral Daily   trimethoprim  100 mg Oral Daily   Continuous Infusions:  cefTRIAXone (ROCEPHIN)  IV Stopped (09/08/23 0210)   lactated ringers     metronidazole 500 mg (09/08/23 0830)     LOS: 2 days   Tresa Moore, MD [facility]   If 7PM-7AM, please contact night-coverage  09/08/2023, 11:28 AM

## 2023-09-08 NOTE — Evaluation (Signed)
 Physical Therapy Evaluation Patient Details Name: Devin White MRN: [id number] DOB: 1955-09-25 Today's Date: 09/08/2023  History of Present Illness  68 y/o male presented to ED on 09/06/23 for presyncope and rectal pain. Admitted for sepsis due to undetermined organism. PMH: liver cirrhosis s/p liver transplant, CHF, CKD stage IIIa, HTN  Clinical Impression  Patient admitted with the above. PTA, patient lives alone in a senior living apartment which is handicap accessible except has tub shower. Reports he is independent with no AD at baseline. Patient presents with weakness, impaired balance, and decreased activity tolerance. Required minA to stand from low bed surface and CGA for ambulation with RW 20' with L knee flexed in stance throughout. Patient will benefit from skilled PT services during acute stay to address listed deficits. Patient will benefit from ongoing therapy at discharge to maximize functional independence and safety. Anticipate patient will progress to home with HHPT with more opportunities for OOB mobility.         If plan is discharge home, recommend the following: A little help with walking and/or transfers;A little help with bathing/dressing/bathroom;Assistance with cooking/housework;Assist for transportation;Help with stairs or ramp for entrance   Can travel by private vehicle   Yes    Equipment Recommendations Rollator (4 wheels)  Recommendations for Other Services       Functional Status Assessment Patient has had a recent decline in their functional status and demonstrates the ability to make significant improvements in function in a reasonable and predictable amount of time.     Precautions / Restrictions Precautions Precautions: Fall Recall of Precautions/Restrictions: Intact Restrictions Weight Bearing Restrictions Per Provider Order: No      Mobility  Bed Mobility Overal bed mobility: Needs Assistance Bed Mobility: Supine to Sit, Sit to Supine      Supine to sit: Contact guard, HOB elevated Sit to supine: Contact guard assist        Transfers Overall transfer level: Needs assistance Equipment used: Rolling Addam Goeller (2 wheels) Transfers: Sit to/from Stand Sit to Stand: Min assist                Ambulation/Gait Ambulation/Gait assistance: Contact guard assist Gait Distance (Feet): 20 Feet Assistive device: Rolling Lamaya Hyneman (2 wheels) Gait Pattern/deviations: Step-to pattern, Decreased stride length, Knee flexed in stance - left Gait velocity: decreased     General Gait Details: CGA for safety. L knee flexed in stance throughout  Stairs            Wheelchair Mobility     Tilt Bed    Modified Rankin (Stroke Patients Only)       Balance Overall balance assessment: Needs assistance Sitting-balance support: Feet supported, No upper extremity supported Sitting balance-Leahy Scale: Good     Standing balance support: Bilateral upper extremity supported, Reliant on assistive device for balance Standing balance-Leahy Scale: Poor                               Pertinent Vitals/Pain Pain Assessment Pain Assessment: Faces Faces Pain Scale: Hurts whole lot Pain Location: rectum Pain Descriptors / Indicators: Discomfort, Sharp Pain Intervention(s): Limited activity within patient's tolerance, Monitored during session, Repositioned    Home Living Family/patient expects to be discharged to:: Private residence Living Arrangements: Alone Available Help at Discharge: Family;Available PRN/intermittently (daughter in [place]) Type of Home: Apartment Home Access: Level entry       Home Layout: One level Home Equipment: Agricultural consultant (2 wheels);Grab bars -  toilet;Grab bars - tub/shower      Prior Function Prior Level of Function : Independent/Modified Independent;Driving             Mobility Comments: reports no AD at baseline. Intermittent use of RW when "needed" ADLs Comments:  Independent with ADL. Daughter recently started doing grocery shopping     Extremity/Trunk Assessment   Upper Extremity Assessment Upper Extremity Assessment: Defer to OT evaluation    Lower Extremity Assessment Lower Extremity Assessment: Generalized weakness    Cervical / Trunk Assessment Cervical / Trunk Assessment: Normal  Communication   Communication Communication: No apparent difficulties    Cognition Arousal: Alert Behavior During Therapy: WFL for tasks assessed/performed   PT - Cognitive impairments: No apparent impairments                         Following commands: Intact       Cueing       General Comments      Exercises     Assessment/Plan    PT Assessment Patient needs continued PT services  PT Problem List Decreased strength;Decreased activity tolerance;Decreased balance;Decreased mobility;Decreased safety awareness;Decreased knowledge of use of DME;Decreased knowledge of precautions;Cardiopulmonary status limiting activity       PT Treatment Interventions Gait training;DME instruction;Functional mobility training;Therapeutic activities;Therapeutic exercise;Balance training;Patient/family education    PT Goals (Current goals can be found in the Care Plan section)  Acute Rehab PT Goals Patient Stated Goal: to get stronger PT Goal Formulation: With patient Time For Goal Achievement: 09/22/23 Potential to Achieve Goals: Good    Frequency Min 3X/week     Co-evaluation               AM-PAC PT "6 Clicks" Mobility  Outcome Measure Help needed turning from your back to your side while in a flat bed without using bedrails?: A Little Help needed moving from lying on your back to sitting on the side of a flat bed without using bedrails?: A Little Help needed moving to and from a bed to a chair (including a wheelchair)?: A Little Help needed standing up from a chair using your arms (e.g., wheelchair or bedside chair)?: A Little Help  needed to walk in hospital room?: A Little Help needed climbing 3-5 steps with a railing? : A Lot 6 Click Score: 17    End of Session   Activity Tolerance: Patient tolerated treatment well Patient left: in bed;with call bell/phone within reach;with bed alarm set Nurse Communication: Mobility status PT Visit Diagnosis: Unsteadiness on feet (R26.81);Muscle weakness (generalized) (M62.81);Other abnormalities of gait and mobility (R26.89)    Time: 4098-1191 PT Time Calculation (min) (ACUTE ONLY): 18 min   Charges:   PT Evaluation $PT Eval Moderate Complexity: 1 Mod   PT General Charges $$ ACUTE PT VISIT: 1 Visit         Maylon Peppers, PT, DPT Physical Therapist - [facility]  [facility]   Yiannis Tulloch A Kandas Oliveto 09/08/2023, 3:34 PM

## 2023-09-09 ENCOUNTER — Inpatient Hospital Stay: Admitting: Anesthesiology

## 2023-09-09 ENCOUNTER — Encounter: Payer: Self-pay | Admitting: Family Medicine

## 2023-09-09 ENCOUNTER — Encounter
Admission: EM | Disposition: A | Discharge status: Home Health | Payer: Self-pay | Source: Home / Self Care | Attending: Internal Medicine

## 2023-09-09 ENCOUNTER — Encounter: Payer: Self-pay | Admitting: Anesthesiology

## 2023-09-09 ENCOUNTER — Inpatient Hospital Stay

## 2023-09-09 ENCOUNTER — Other Ambulatory Visit: Payer: Self-pay

## 2023-09-09 DIAGNOSIS — M7989 Other specified soft tissue disorders: Secondary | ICD-10-CM | POA: Diagnosis present

## 2023-09-09 DIAGNOSIS — M726 Necrotizing fasciitis: Secondary | ICD-10-CM | POA: Diagnosis not present

## 2023-09-09 DIAGNOSIS — I2510 Atherosclerotic heart disease of native coronary artery without angina pectoris: Secondary | ICD-10-CM | POA: Diagnosis not present

## 2023-09-09 DIAGNOSIS — A419 Sepsis, unspecified organism: Secondary | ICD-10-CM | POA: Diagnosis not present

## 2023-09-09 DIAGNOSIS — I5032 Chronic diastolic (congestive) heart failure: Secondary | ICD-10-CM | POA: Diagnosis not present

## 2023-09-09 DIAGNOSIS — K610 Anal abscess: Secondary | ICD-10-CM | POA: Diagnosis not present

## 2023-09-09 DIAGNOSIS — I110 Hypertensive heart disease with heart failure: Secondary | ICD-10-CM | POA: Diagnosis not present

## 2023-09-09 HISTORY — PX: INCISION AND DRAINAGE ABSCESS: SHX5864

## 2023-09-09 LAB — BASIC METABOLIC PANEL
Anion gap: 7 (ref 5–15)
BUN: 21 mg/dL (ref 8–23)
CO2: 25 mmol/L (ref 22–32)
Calcium: 8.5 mg/dL — ABNORMAL LOW (ref 8.9–10.3)
Chloride: 108 mmol/L (ref 98–111)
Creatinine, Ser: 2.11 mg/dL — ABNORMAL HIGH (ref 0.61–1.24)
GFR, Estimated: 34 mL/min — ABNORMAL LOW (ref >60)
Glucose, Bld: 105 mg/dL — ABNORMAL HIGH (ref 70–99)
Potassium: 3.9 mmol/L (ref 3.5–5.1)
Sodium: 140 mmol/L (ref 135–145)

## 2023-09-09 LAB — GLUCOSE, CAPILLARY
Glucose-Capillary: 101 mg/dL — ABNORMAL HIGH (ref 70–99)
Glucose-Capillary: 153 mg/dL — ABNORMAL HIGH (ref 70–99)
Glucose-Capillary: 183 mg/dL — ABNORMAL HIGH (ref 70–99)
Glucose-Capillary: 188 mg/dL — ABNORMAL HIGH (ref 70–99)
Glucose-Capillary: 221 mg/dL — ABNORMAL HIGH (ref 70–99)

## 2023-09-09 LAB — CBC
HCT: 35.4 % — ABNORMAL LOW (ref 39.0–52.0)
Hemoglobin: 11.8 g/dL — ABNORMAL LOW (ref 13.0–17.0)
MCH: 31.2 pg (ref 26.0–34.0)
MCHC: 33.3 g/dL (ref 30.0–36.0)
MCV: 93.7 fL (ref 80.0–100.0)
Platelets: 120 10*3/uL — ABNORMAL LOW (ref 150–400)
RBC: 3.78 MIL/uL — ABNORMAL LOW (ref 4.22–5.81)
RDW: 14.2 % (ref 11.5–15.5)
WBC: 9.9 10*3/uL (ref 4.0–10.5)
nRBC: 0 % (ref 0.0–0.2)

## 2023-09-09 SURGERY — INCISION AND DRAINAGE, ABSCESS
Anesthesia: General | Site: Perineum

## 2023-09-09 MED ORDER — SUCCINYLCHOLINE CHLORIDE 200 MG/10ML IV SOSY
PREFILLED_SYRINGE | INTRAVENOUS | Status: DC | PRN
Start: 2023-09-09 — End: 2023-09-09
  Administered 2023-09-09: 120 mg via INTRAVENOUS

## 2023-09-09 MED ORDER — SODIUM CHLORIDE 0.9 % IV SOLN
2.0000 g | Freq: Once | INTRAVENOUS | Status: AC
  Administered 2023-09-09: 2 g via INTRAVENOUS

## 2023-09-09 MED ORDER — SUCCINYLCHOLINE CHLORIDE 200 MG/10ML IV SOSY
PREFILLED_SYRINGE | INTRAVENOUS | Status: AC
  Filled 2023-09-09: qty 10

## 2023-09-09 MED ORDER — HYDROMORPHONE HCL 1 MG/ML IJ SOLN
0.5000 mg | INTRAMUSCULAR | Status: DC | PRN
  Administered 2023-09-10 – 2023-09-13 (×4): 0.5 mg via INTRAVENOUS
  Filled 2023-09-09 (×5): qty 0.5

## 2023-09-09 MED ORDER — FENTANYL CITRATE (PF) 100 MCG/2ML IJ SOLN
INTRAMUSCULAR | Status: AC
  Filled 2023-09-09: qty 2

## 2023-09-09 MED ORDER — MIDAZOLAM HCL 2 MG/2ML IJ SOLN
INTRAMUSCULAR | Status: AC
  Filled 2023-09-09: qty 2

## 2023-09-09 MED ORDER — VANCOMYCIN HCL 500 MG/100ML IV SOLN
500.0000 mg | Freq: Once | INTRAVENOUS | Status: AC
  Administered 2023-09-09: 500 mg via INTRAVENOUS
  Filled 2023-09-09: qty 100

## 2023-09-09 MED ORDER — ACETAMINOPHEN 10 MG/ML IV SOLN
INTRAVENOUS | Status: DC | PRN
  Administered 2023-09-09: 1000 mg via INTRAVENOUS

## 2023-09-09 MED ORDER — ACETAMINOPHEN 10 MG/ML IV SOLN: 1000.0000 mg | Freq: Once | INTRAVENOUS | Status: DC | PRN

## 2023-09-09 MED ORDER — FENTANYL CITRATE (PF) 100 MCG/2ML IJ SOLN
INTRAMUSCULAR | Status: DC | PRN
  Administered 2023-09-09: 50 ug via INTRAVENOUS
  Administered 2023-09-09 (×2): 25 ug via INTRAVENOUS

## 2023-09-09 MED ORDER — VANCOMYCIN HCL 2000 MG/400ML IV SOLN
2000.0000 mg | Freq: Once | INTRAVENOUS | Status: AC
  Administered 2023-09-09: 2000 mg via INTRAVENOUS
  Filled 2023-09-09: qty 400

## 2023-09-09 MED ORDER — LIDOCAINE HCL (CARDIAC) PF 100 MG/5ML IV SOSY
PREFILLED_SYRINGE | INTRAVENOUS | Status: DC | PRN
  Administered 2023-09-09: 100 mg via INTRAVENOUS

## 2023-09-09 MED ORDER — IRRISEPT - 450ML BOTTLE WITH 0.05% CHG IN STERILE WATER, USP 99.95% OPTIME
TOPICAL | Status: DC | PRN
  Administered 2023-09-09: 450 mL

## 2023-09-09 MED ORDER — PIPERACILLIN-TAZOBACTAM 3.375 G IVPB
3.3750 g | Freq: Three times a day (TID) | INTRAVENOUS | Status: DC
  Administered 2023-09-09 – 2023-09-14 (×14): 3.375 g via INTRAVENOUS
  Filled 2023-09-09 (×14): qty 50

## 2023-09-09 MED ORDER — OXYCODONE HCL 5 MG/5ML PO SOLN
5.0000 mg | Freq: Once | ORAL | Status: AC | PRN
Start: 2023-09-09 — End: 2023-09-09

## 2023-09-09 MED ORDER — ONDANSETRON HCL 4 MG/2ML IJ SOLN
INTRAMUSCULAR | Status: AC
  Filled 2023-09-09: qty 2

## 2023-09-09 MED ORDER — DAKINS (1/4 STRENGTH) 0.125 % EX SOLN
Freq: Every day | CUTANEOUS | Status: DC
  Administered 2023-09-11: 1
  Filled 2023-09-09: qty 1

## 2023-09-09 MED ORDER — PHENYLEPHRINE 80 MCG/ML (10ML) SYRINGE FOR IV PUSH (FOR BLOOD PRESSURE SUPPORT)
PREFILLED_SYRINGE | INTRAVENOUS | Status: DC | PRN
  Administered 2023-09-09 (×2): 80 ug via INTRAVENOUS

## 2023-09-09 MED ORDER — ACETAMINOPHEN 10 MG/ML IV SOLN
INTRAVENOUS | Status: AC
  Filled 2023-09-09: qty 100

## 2023-09-09 MED ORDER — FENTANYL CITRATE (PF) 100 MCG/2ML IJ SOLN: 25.0000 ug | INTRAMUSCULAR | Status: DC | PRN

## 2023-09-09 MED ORDER — MIDAZOLAM HCL 2 MG/2ML IJ SOLN
INTRAMUSCULAR | Status: DC | PRN
  Administered 2023-09-09: 2 mg via INTRAVENOUS

## 2023-09-09 MED ORDER — LIDOCAINE HCL (PF) 2 % IJ SOLN
INTRAMUSCULAR | Status: AC
  Filled 2023-09-09: qty 5

## 2023-09-09 MED ORDER — ONDANSETRON HCL 4 MG/2ML IJ SOLN
INTRAMUSCULAR | Status: DC | PRN
  Administered 2023-09-09: 4 mg via INTRAVENOUS

## 2023-09-09 MED ORDER — KETAMINE HCL 50 MG/5ML IJ SOSY
PREFILLED_SYRINGE | INTRAMUSCULAR | Status: DC | PRN
  Administered 2023-09-09: 20 mg via INTRAVENOUS

## 2023-09-09 MED ORDER — PIPERACILLIN-TAZOBACTAM 3.375 G IVPB
INTRAVENOUS | Status: AC
  Filled 2023-09-09: qty 50

## 2023-09-09 MED ORDER — PHENYLEPHRINE 80 MCG/ML (10ML) SYRINGE FOR IV PUSH (FOR BLOOD PRESSURE SUPPORT)
PREFILLED_SYRINGE | INTRAVENOUS | Status: AC
  Filled 2023-09-09: qty 10

## 2023-09-09 MED ORDER — EPHEDRINE 5 MG/ML INJ
INTRAVENOUS | Status: AC
  Filled 2023-09-09: qty 5

## 2023-09-09 MED ORDER — PIPERACILLIN-TAZOBACTAM 3.375 G IVPB 30 MIN
3.3750 g | Freq: Once | INTRAVENOUS | Status: AC
  Administered 2023-09-09: 3.375 g via INTRAVENOUS

## 2023-09-09 MED ORDER — OXYCODONE HCL 5 MG PO TABS
5.0000 mg | ORAL_TABLET | Freq: Once | ORAL | Status: AC | PRN
  Administered 2023-09-09: 5 mg via ORAL

## 2023-09-09 MED ORDER — EPHEDRINE SULFATE-NACL 50-0.9 MG/10ML-% IV SOSY
PREFILLED_SYRINGE | INTRAVENOUS | Status: DC | PRN
  Administered 2023-09-09: 5 mg via INTRAVENOUS

## 2023-09-09 MED ORDER — PROPOFOL 10 MG/ML IV BOLUS
INTRAVENOUS | Status: DC | PRN
  Administered 2023-09-09: 200 mg via INTRAVENOUS

## 2023-09-09 MED ORDER — 0.9 % SODIUM CHLORIDE (POUR BTL) OPTIME
TOPICAL | Status: DC | PRN
  Administered 2023-09-09: 500 mL

## 2023-09-09 MED ORDER — SENNOSIDES-DOCUSATE SODIUM 8.6-50 MG PO TABS
1.0000 | ORAL_TABLET | Freq: Two times a day (BID) | ORAL | Status: DC
  Administered 2023-09-09 – 2023-09-14 (×10): 1 via ORAL
  Filled 2023-09-09 (×10): qty 1

## 2023-09-09 MED ORDER — ROCURONIUM BROMIDE 10 MG/ML (PF) SYRINGE
PREFILLED_SYRINGE | INTRAVENOUS | Status: AC
  Filled 2023-09-09: qty 10

## 2023-09-09 MED ORDER — OXYCODONE HCL 5 MG PO TABS
ORAL_TABLET | ORAL | Status: AC
  Filled 2023-09-09: qty 1

## 2023-09-09 MED ORDER — PROPOFOL 10 MG/ML IV BOLUS
INTRAVENOUS | Status: AC
  Filled 2023-09-09: qty 20

## 2023-09-09 MED ORDER — DEXAMETHASONE SODIUM PHOSPHATE 10 MG/ML IJ SOLN
INTRAMUSCULAR | Status: AC
  Filled 2023-09-09: qty 1

## 2023-09-09 MED ORDER — PHENYLEPHRINE HCL-NACL 20-0.9 MG/250ML-% IV SOLN
INTRAVENOUS | Status: DC | PRN
  Administered 2023-09-09: 80 ug/min via INTRAVENOUS

## 2023-09-09 MED ORDER — VANCOMYCIN HCL 1750 MG/350ML IV SOLN: 1750.0000 mg | INTRAVENOUS | Status: DC

## 2023-09-09 MED ORDER — DROPERIDOL 2.5 MG/ML IJ SOLN: 0.6250 mg | Freq: Once | INTRAMUSCULAR | Status: DC | PRN

## 2023-09-09 MED ORDER — KETAMINE HCL 50 MG/5ML IJ SOSY
PREFILLED_SYRINGE | INTRAMUSCULAR | Status: AC
  Filled 2023-09-09: qty 5

## 2023-09-09 MED ORDER — OXYCODONE HCL 5 MG PO TABS
5.0000 mg | ORAL_TABLET | ORAL | Status: DC | PRN
  Administered 2023-09-09 (×2): 10 mg via ORAL
  Administered 2023-09-10: 5 mg via ORAL
  Administered 2023-09-10 (×4): 10 mg via ORAL
  Administered 2023-09-11: 5 mg via ORAL
  Administered 2023-09-11 (×4): 10 mg via ORAL
  Administered 2023-09-12: 5 mg via ORAL
  Administered 2023-09-12 – 2023-09-14 (×6): 10 mg via ORAL
  Filled 2023-09-09 (×12): qty 2
  Filled 2023-09-09: qty 1
  Filled 2023-09-09 (×6): qty 2

## 2023-09-09 MED ORDER — SODIUM CHLORIDE 0.9 % IV SOLN
INTRAVENOUS | Status: AC
  Filled 2023-09-09: qty 2

## 2023-09-09 MED ORDER — LACTATED RINGERS IV SOLN: INTRAVENOUS | Status: DC | PRN

## 2023-09-09 SURGICAL SUPPLY — 29 items
BASIN GRAD PLASTIC 32OZ STRL (MISCELLANEOUS) IMPLANT
BLADE CLIPPER SURG (BLADE) ×1 IMPLANT
BLADE SURG 15 STRL LF DISP TIS (BLADE) ×1 IMPLANT
BNDG GAUZE DERMACEA FLUFF 4 (GAUZE/BANDAGES/DRESSINGS) IMPLANT
BRIEF MESH DISP 2XL (UNDERPADS AND DIAPERS) IMPLANT
BRUSH SCRUB EZ 4% CHG (MISCELLANEOUS) ×1 IMPLANT
DRAPE LAPAROTOMY 77X122 PED (DRAPES) ×1 IMPLANT
DRAPE PERI LITHO V/GYN (MISCELLANEOUS) IMPLANT
DRAPE UNDER BUTTOCK W/FLU (DRAPES) IMPLANT
ELECT REM PT RETURN 9FT ADLT (ELECTROSURGICAL) ×1 IMPLANT
ELECTRODE REM PT RTRN 9FT ADLT (ELECTROSURGICAL) ×1 IMPLANT
GAUZE 4X4 16PLY ~~LOC~~+RFID DBL (SPONGE) ×1 IMPLANT
GAUZE PAD ABD 8X10 STRL (GAUZE/BANDAGES/DRESSINGS) IMPLANT
GLOVE BIO SURGEON STRL SZ7 (GLOVE) ×1 IMPLANT
GOWN STRL REUS W/ TWL LRG LVL3 (GOWN DISPOSABLE) ×2 IMPLANT
JET LAVAGE IRRISEPT WOUND (IRRIGATION / IRRIGATOR) ×1 IMPLANT
LAVAGE JET IRRISEPT WOUND (IRRIGATION / IRRIGATOR) IMPLANT
MANIFOLD NEPTUNE II (INSTRUMENTS) ×1 IMPLANT
NDL HYPO 22X1.5 SAFETY MO (MISCELLANEOUS) ×1 IMPLANT
NEEDLE HYPO 22X1.5 SAFETY MO (MISCELLANEOUS) ×1 IMPLANT
NS IRRIG 1000ML POUR BTL (IV SOLUTION) ×1 IMPLANT
NS IRRIG 500ML POUR BTL (IV SOLUTION) IMPLANT
PACK BASIN MINOR ARMC (MISCELLANEOUS) ×1 IMPLANT
PAD PREP OB/GYN DISP 24X41 (PERSONAL CARE ITEMS) IMPLANT
SOL PREP PVP 2OZ (MISCELLANEOUS) ×1 IMPLANT
SOLUTION PREP PVP 2OZ (MISCELLANEOUS) ×1 IMPLANT
SPONGE T-LAP 18X18 ~~LOC~~+RFID (SPONGE) ×1 IMPLANT
TRAP FLUID SMOKE EVACUATOR (MISCELLANEOUS) ×1 IMPLANT
WATER STERILE IRR 500ML POUR (IV SOLUTION) ×1 IMPLANT

## 2023-09-09 NOTE — Transfer of Care (Signed)
 Immediate Anesthesia Transfer of Care Note  Patient: Devin White  Procedure(s) Performed: INCISION AND DRAINAGE, ABSCESS (Perineum)  Patient Location: PACU  Anesthesia Type:General  Level of Consciousness: awake and drowsy  Airway & Oxygen Therapy: Patient Spontanous Breathing and Patient connected to face mask oxygen  Post-op Assessment: Report given to RN and Post -op Vital signs reviewed and stable  Post vital signs: Reviewed and stable  Last Vitals:  Vitals Value Taken Time  BP 109/66 09/09/23 1600  Temp 37.2 C 09/09/23 1600  Pulse 99 09/09/23 1607  Resp 17 09/09/23 1607  SpO2 93 % 09/09/23 1607  Vitals shown include unfiled device data.  Last Pain:  Vitals:   09/09/23 1130  TempSrc:   PainSc: 3       Patients Stated Pain Goal: 4 (09/06/23 1956)  Complications: No notable events documented.

## 2023-09-09 NOTE — Care Management Important Message (Signed)
 Important Message  Patient Details  Name: Devin White MRN: [id number] Date of Birth: 1955/06/27   Important Message Given:  Yes - Medicare IM     Cristela Blue, CMA 09/09/2023, 12:15 PM

## 2023-09-09 NOTE — Anesthesia Procedure Notes (Signed)
 Procedure Name: Intubation Date/Time: 09/09/2023 2:59 PM  Performed by: Morene Crocker, CRNAPre-anesthesia Checklist: Patient identified, Patient being monitored, Timeout performed, Emergency Drugs available and Suction available Patient Re-evaluated:Patient Re-evaluated prior to induction Oxygen Delivery Method: Circle system utilized Preoxygenation: Pre-oxygenation with 100% oxygen Induction Type: IV induction, Rapid sequence and Cricoid Pressure applied Laryngoscope Size: 3 and McGrath Grade View: Grade I Tube type: Oral Tube size: 7.0 mm Number of attempts: 1 Airway Equipment and Method: Stylet Placement Confirmation: ETT inserted through vocal cords under direct vision, positive ETCO2 and breath sounds checked- equal and bilateral Secured at: 22 cm Tube secured with: Tape Dental Injury: Teeth and Oropharynx as per pre-operative assessment  Comments: Smooth, atraumatic intubation. No complications noted. RSI due to pt's NPO status.

## 2023-09-09 NOTE — Progress Notes (Signed)
 PT Cancellation Note  Patient Details Name: Devin White MRN: [id number] DOB: Aug 17, 1955   Cancelled Treatment:    Reason Eval/Treat Not Completed: Patient at procedure or test/unavailable Plan for I&D of abscess this date. Will re-attempt at later date/time.   Maylon Peppers, PT, DPT Physical Therapist - [facility]  [facility]    Aneliese Beaudry A Lisa Blakeman 09/09/2023, 12:51 PM

## 2023-09-09 NOTE — Evaluation (Signed)
 Occupational Therapy Evaluation Patient Details Name: Devin White MRN: [id number] DOB: 1956/04/22 Today's Date: 09/09/2023   History of Present Illness   68 y/o male presented to ED on 09/06/23 for presyncope and rectal pain. Admitted for sepsis due to undetermined organism. PMH: liver cirrhosis s/p liver transplant, CHF, CKD stage IIIa, HTN     Clinical Impressions Pt was seen for OT evaluation this date. Prior to hospital admission, pt reports being fairly independent, daughter recently assisting with groceries and using 2WW PRN. Pt lives alone in a 2nd floor apartment without elevator access. Pt presents to acute OT demonstrating impaired ADL performance and functional mobility 2/2 decreased strength, balance, and pain (See OT problem list for additional functional deficits). Pt currently requires MOD A for LB ADL, supv for perineal bathing in side lying. Deferred OOB 2/2 rectal pain and upcoming imaging. Pt would benefit from skilled OT services to address noted impairments and functional limitations (see below for any additional details) in order to maximize safety and independence while minimizing falls risk and caregiver burden.    If plan is discharge home, recommend the following:   A lot of help with walking and/or transfers;A lot of help with bathing/dressing/bathroom;Assistance with cooking/housework;Assist for transportation;Help with stairs or ramp for entrance     Functional Status Assessment   Patient has had a recent decline in their functional status and demonstrates the ability to make significant improvements in function in a reasonable and predictable amount of time.     Equipment Recommendations   None recommended by OT     Recommendations for Other Services         Precautions/Restrictions   Precautions Precautions: Fall Recall of Precautions/Restrictions: Intact Restrictions Weight Bearing Restrictions Per Provider Order: No     Mobility Bed  Mobility Overal bed mobility: Needs Assistance Bed Mobility: Sidelying to Sit, Sit to Supine, Rolling Rolling: Modified independent (Device/Increase time) Sidelying to sit: Used rails, HOB elevated, Supervision   Sit to supine: Supervision   General bed mobility comments: heavy use of bed rails and HOB elevated    Transfers                   General transfer comment: deferred      Balance Overall balance assessment: Needs assistance Sitting-balance support: Feet supported, No upper extremity supported, Single extremity supported Sitting balance-Leahy Scale: Fair Sitting balance - Comments: offweighting R hip slightly 2/2 rectal pain                                   ADL either performed or assessed with clinical judgement   ADL Overall ADL's : Needs assistance/impaired                                       General ADL Comments: Pt requires MAX A for LB ADL tasks from EOB and was able to complete pericare/peribathing while in sidelying with set up.     Vision         Perception         Praxis         Pertinent Vitals/Pain Pain Assessment Pain Assessment: 0-10 Pain Score: 7  Pain Location: rectum Pain Descriptors / Indicators: Discomfort, Sharp, Guarding, Grimacing Pain Intervention(s): Limited activity within patient's tolerance, Monitored during session, Premedicated before session, Repositioned  Extremity/Trunk Assessment Upper Extremity Assessment Upper Extremity Assessment: Overall WFL for tasks assessed   Lower Extremity Assessment Lower Extremity Assessment: Generalized weakness   Cervical / Trunk Assessment Cervical / Trunk Assessment: Normal   Communication     Cognition Arousal: Alert Behavior During Therapy: WFL for tasks assessed/performed Cognition: No apparent impairments                               Following commands: Intact       Cueing  General Comments           Exercises     Shoulder Instructions      Home Living Family/patient expects to be discharged to:: Private residence Living Arrangements: Alone Available Help at Discharge: Family;Available PRN/intermittently (daughter in [place]) Type of Home: Apartment Home Access: Level entry     Home Layout: One level     Bathroom Shower/Tub: Chief Strategy Officer: Handicapped height     Home Equipment: Agricultural consultant (2 wheels);Grab bars - toilet;Grab bars - tub/shower          Prior Functioning/Environment Prior Level of Function : Independent/Modified Independent;Driving             Mobility Comments: reports no AD at baseline. Intermittent use of RW when "needed" ADLs Comments: Independent with ADL. Daughter recently started doing grocery shopping    OT Problem List: Decreased strength;Pain;Impaired balance (sitting and/or standing);Decreased knowledge of use of DME or AE;Obesity   OT Treatment/Interventions: Self-care/ADL training;Therapeutic exercise;Therapeutic activities;DME and/or AE instruction;Balance training;Patient/family education      OT Goals(Current goals can be found in the care plan section)   Acute Rehab OT Goals Patient Stated Goal: figure out what's going on with me OT Goal Formulation: With patient Time For Goal Achievement: 09/23/23 Potential to Achieve Goals: Good ADL Goals Pt Will Perform Lower Body Dressing: sitting/lateral leans;sit to/from stand;with modified independence Pt Will Transfer to Toilet: ambulating;with contact guard assist (LRAD) Pt Will Perform Toileting - Clothing Manipulation and hygiene: with modified independence;sitting/lateral leans;sit to/from stand Additional ADL Goal #1: Pt will verbalize plan to implement at least 1 learned falls prevention strategy to maximize safety/indep.   OT Frequency:  Min 1X/week    Co-evaluation              AM-PAC OT "6 Clicks" Daily Activity     Outcome Measure Help  from another person eating meals?: None Help from another person taking care of personal grooming?: None Help from another person toileting, which includes using toliet, bedpan, or urinal?: A Lot Help from another person bathing (including washing, rinsing, drying)?: A Little Help from another person to put on and taking off regular upper body clothing?: A Little Help from another person to put on and taking off regular lower body clothing?: A Lot 6 Click Score: 18   End of Session    Activity Tolerance: Patient limited by pain Patient left: in bed;with call bell/phone within reach;with bed alarm set  OT Visit Diagnosis: Other abnormalities of gait and mobility (R26.89);Muscle weakness (generalized) (M62.81);Pain Pain - Right/Left:  (rectum)                Time: 4098-1191 OT Time Calculation (min): 23 min Charges:  OT General Charges $OT Visit: 1 Visit OT Evaluation $OT Eval Low Complexity: 1 Low OT Treatments $Self Care/Home Management : 8-22 mins  Arman Filter., MPH, MS, OTR/L ascom [phone] 09/09/23, 1:42 PM

## 2023-09-09 NOTE — Progress Notes (Addendum)
 ADDENDUM 10:22 AM  CT pelvis reviewed now with subcutaneous gas in the perineum concerning for necrotizing infection especially in immunosuppressed patient. I do think he will need emergent debridement once OR/Anesthesia available.   I have made him NPO. He did have breakfast but I do feel this is an emergent situation.   All risks, benefits, and alternatives to above procedure(s) were discussed with the patient, all of his questions were answered to his expressed satisfaction, patient expresses he wishes to proceed, and informed consent was obtained.    [facility] SURGICAL PROGRESS NOTE (cpt [phone])  Hospital Day(s): 3.   Interval History: Patient seen and examined, no acute events or new complaints overnight. Patient reports his perineal and scrotal pain is worse this morning. Area is still tender and warm. He did have trouble voiding overnight and required I/O catheter. No fever. Leukocytosis resolved; WBC 9.9K. He continues on Rocephin/Flagyl  Review of Systems:  Constitutional: denies fever, chills  HEENT: denies cough or congestion  Respiratory: denies any shortness of breath  Cardiovascular: denies chest pain or palpitations  Gastrointestinal: denies abdominal pain, N/V Genitourinary: + trouble voiding Musculoskeletal: denies pain, decreased motor or sensation Integumentary: + erythema, + warmth  Vital signs in last 24 hours: [min-max] current  Temp:  [97.4 F (36.3 C)-99.1 F (37.3 C)] 97.4 F (36.3 C) (03/25 0429) Pulse Rate:  [81-91] 87 (03/25 0429) Resp:  [16-18] 18 (03/25 0429) BP: (113-136)/(69-82) 136/82 (03/25 0429) SpO2:  [87 %-93 %] 87 % (03/25 0429)     Height: 6\' 1"  (185.4 cm) Weight: 120.6 kg BMI (Calculated): 35.09   Intake/Output last 2 shifts:  03/24 0701 - 03/25 0700 In: 3128.2 [P.O.:1200; I.V.:1428.2; IV Piggyback:500] Out: 1775 [Urine:1775]   Physical Exam:  Constitutional: alert, cooperative and no distress  HENT: normocephalic  without obvious abnormality  Eyes: PERRL, EOM's grossly intact and symmetric  Respiratory: breathing non-labored at rest  Cardiovascular: regular rate and sinus rhythm  Gastrointestinal: soft, non-tender, and non-distended GU: Chaperone present, there is erythema and tenderness from the right perineum to the scrotum, this is warm, indurated, no gross fluctuance or crepitus.    Labs:     Latest Ref Rng & Units 09/08/2023    6:22 AM 09/07/2023    5:02 AM 09/06/2023    2:28 PM  CBC  WBC 4.0 - 10.5 K/uL 12.3  17.1  26.8   Hemoglobin 13.0 - 17.0 g/dL 40.9  81.1  91.4   Hematocrit 39.0 - 52.0 % 34.6  38.0  45.2   Platelets 150 - 400 K/uL 107  111  183       Latest Ref Rng & Units 09/08/2023    6:22 AM 09/07/2023    5:02 AM 09/06/2023    2:28 PM  CMP  Glucose 70 - 99 mg/dL 97  89  782   BUN 8 - 23 mg/dL 27  35  37   Creatinine 0.61 - 1.24 mg/dL 9.56  2.13  0.86   Sodium 135 - 145 mmol/L 139  139  134   Potassium 3.5 - 5.1 mmol/L 4.0  4.5  4.8   Chloride 98 - 111 mmol/L 108  109  109   CO2 22 - 32 mmol/L 22  24  20    Calcium 8.9 - 10.3 mg/dL 8.5  8.5  9.0   Total Protein 6.5 - 8.1 g/dL   6.8   Total Bilirubin 0.0 - 1.2 mg/dL   2.7   Alkaline Phos 38 - 126 U/L  37   AST 15 - 41 U/L   23   ALT 0 - 44 U/L   22      Imaging studies: No new pertinent imaging studies   Assessment/Plan:  68 y.o. male with right perineal/buttock pain concerning for cellulitis without gross abscess, complicated by pertinent comorbidities including liver transplant, DM.   - Although leukocytosis resolved and he is afebrile, he continues to have significant pain and erythema to this area and now with trouble voiding overnight. I do think it will be prudent to get CT Pelvis with to ensure no underlying abscess or necrotizing infection especially given his immunosuppression and DM. He unfortunately got AKI with IV contrast previous this admission, so will not give   - Continue IV Abx (Rocephin/Flagyl)  - Pain  control prn  - Further management per primary service; we will follow    All of the above findings and recommendations were discussed with the patient, and the medical team, and all of patient's questions were answered to his expressed satisfaction.  -- Lynden Oxford, PA-C [facility] 09/09/2023, 7:11 AM M-F: 7am - 4pm

## 2023-09-09 NOTE — Anesthesia Preprocedure Evaluation (Addendum)
 Anesthesia Evaluation  Patient identified by MRN, date of birth, ID band Patient awake    Reviewed: Allergy & Precautions, H&P , NPO status , Patient's Chart, lab work & pertinent test results  Airway Mallampati: II  TM Distance: >3 FB Neck ROM: full    Dental no notable dental hx.    Pulmonary sleep apnea    Pulmonary exam normal        Cardiovascular hypertension, + CAD and +CHF  Normal cardiovascular exam     Neuro/Psych CVA  negative psych ROS   GI/Hepatic hiatal hernia,GERD  ,, hepatic cell failure status post liver transplant in 2009 on immunosuppressive therapy   Endo/Other  diabetes    Renal/GU Renal diseaseAcute kidney injury superimposed on chronic kidney disease      Musculoskeletal  (+) Arthritis ,    Abdominal   Peds  Hematology  (+) Blood dyscrasia, anemia thrombocytopenia   Anesthesia Other Findings Infection of Perineum  Pt admitted for sepsis due to undetermined organism. PMH: liver cirrhosis s/p liver transplant, CHF, CKD stage IIIa, HTN  Past Medical History: No date: Arthritis     Comment:  back and legs No date: Benign prostatic hypertrophy No date: CHF (congestive heart failure) (HCC) No date: GERD (gastroesophageal reflux disease) No date: Gout No date: History of blood transfusion     Comment:  pt has antibodies in his blood since previous               transfusions S/P TRANSPLANT 2009: History of cirrhosis of liver S/P TRANSPLANT: History of liver failure No date: Hypertension No date: Left ureteral calculus No date: Renal disorder No date: Stroke [facility])  Past Surgical History: No date: APPENDECTOMY No date: bone morrow biopsy 2007: CHOLECYSTECTOMY 03-05-2012  DR Berneice Heinrich [facility]): CYSTO/ LEFT RETROGRADE PYELOGRAM/ LEFT  URETERAL STENT PLACEMENT     Comment:  LEFT URETERAL CALCULI 03/11/2012: CYSTOSCOPY W/ URETERAL STENT PLACEMENT     Comment:  Procedure: CYSTOSCOPY WITH STENT  REPLACEMENT;  Surgeon:               Sebastian Ache, MD;  Location: [facility];  Service: Urology;  Laterality: Left; 2008: HERNIA REPAIR No date: LIVER BIOPSY 11/24/2007: LIVER TRANSPLANT     Comment:  pt states doing well since liver transplant No date: LUMBAR DISC SURGERY No date: LUMBAR FUSION No date: removal of fistula 03/11/2012: URETEROSCOPY     Comment:  Procedure: URETEROSCOPY;  Surgeon: Sebastian Ache, MD;                Location: [facility];  Service: Urology;               Laterality: Left;   STONE MANIPULATION, stone               obtained  [phone] [employer] MCR  BMI    Body Mass Index: 35.08 kg/m      Reproductive/Obstetrics negative OB ROS                             Anesthesia Physical Anesthesia Plan  ASA: 4  Anesthesia Plan: General ETT   Post-op Pain Management:    Induction: Intravenous  PONV Risk Score and Plan: 2 and Ondansetron, Dexamethasone and Midazolam  Airway Management Planned: Oral ETT  Additional Equipment:   Intra-op Plan:   Post-operative Plan: Extubation in  OR  Informed Consent: I have reviewed the patients History and Physical, chart, labs and discussed the procedure including the risks, benefits and alternatives for the proposed anesthesia with the patient or authorized representative who has indicated his/her understanding and acceptance.     Dental Advisory Given  Plan Discussed with: CRNA and Surgeon  Anesthesia Plan Comments: (Patient consented for risks of anesthesia including but not limited to:  - adverse reactions to medications - damage to eyes, teeth, lips or other oral mucosa - nerve damage due to positioning  - sore throat or hoarseness - Damage to heart, brain, nerves, lungs, other parts of body or loss of life  Patient voiced understanding and assent.)        Anesthesia Quick Evaluation

## 2023-09-09 NOTE — Plan of Care (Signed)
 Got called into pt's room.  He was having severe lower abdominal pain and said he was having difficulty voiding.  We did a bladder scan and it showed 840 ml.  Rec'd order for in/out cath and got out 1275 ml urine.  Pt expressed relief.

## 2023-09-09 NOTE — Progress Notes (Signed)
 [facility]  ROUNDING NOTE   Subjective:   Patient seen resting in bed Alert and oriented States he feels well today Appetite appropriate  Creatinine 2.11 Urine output in past 24 hours  Objective:  Vital signs in last 24 hours:  Temp:  [97.4 F (36.3 C)-99 F (37.2 C)] 99 F (37.2 C) (03/25 0759) Pulse Rate:  [81-97] 97 (03/25 0759) Resp:  [16-18] 18 (03/25 0759) BP: (127-155)/(70-86) 155/86 (03/25 0759) SpO2:  [87 %-93 %] 89 % (03/25 0759)  Weight change:  Filed Weights   09/06/23 1425 09/06/23 2014  Weight: 117.9 kg 120.6 kg    Intake/Output: I/O last 3 completed shifts: In: 3608.2 [P.O.:1680; I.V.:1428.2; IV Piggyback:500] Out: 3075 [Urine:3075]   Intake/Output this shift:  Total I/O In: 120 [P.O.:120] Out: 575 [Urine:575]  Physical Exam: General: NAD  Head: Normocephalic, atraumatic. Moist oral mucosal membranes  Eyes: Anicteric  Lungs:  Clear to auscultation, normal effort  Heart: Regular rate and rhythm  Abdomen:  Soft, nontender,   Extremities:  NO peripheral edema.  Neurologic: Alert and oriented, moving all four extremities  Skin: No lesions   Basic Metabolic Panel: Recent Labs  Lab 09/06/23 1428 09/07/23 0502 09/08/23 0622 09/09/23 0720  NA 134* 139 139 140  K 4.8 4.5 4.0 3.9  CL 109 109 108 108  CO2 20* 24 22 25   GLUCOSE 172* 89 97 105*  BUN 37* 35* 27* 21  CREATININE 2.67* 2.28* 2.29* 2.11*  CALCIUM 9.0 8.5* 8.5* 8.5*    Liver Function Tests: Recent Labs  Lab 09/06/23 1428  AST 23  ALT 22  ALKPHOS 37*  BILITOT 2.7*  PROT 6.8  ALBUMIN 4.0   Recent Labs  Lab 09/06/23 1428  LIPASE 28   No results for input(s): "AMMONIA" in the last 168 hours.  CBC: Recent Labs  Lab 09/06/23 1428 09/07/23 0502 09/08/23 0622 09/09/23 0720  WBC 26.8* 17.1* 12.3* 9.9  HGB 15.2 12.6* 12.0* 11.8*  HCT 45.2 38.0* 34.6* 35.4*  MCV 94.8 95.2 93.5 93.7  PLT 183 111* 107* 120*    Cardiac Enzymes: No results for  input(s): "CKTOTAL", "CKMB", "CKMBINDEX", "TROPONINI" in the last 168 hours.  BNP: Invalid input(s): "POCBNP"  CBG: Recent Labs  Lab 09/08/23 1202 09/08/23 1518 09/08/23 2029 09/09/23 0800 09/09/23 1204  GLUCAP 196* 206* 151* 101* 153*    Microbiology: Results for orders placed or performed during the hospital encounter of 09/06/23  Blood Culture (routine x 2)     Status: None (Preliminary result)   Collection Time: 09/06/23  3:53 PM   Specimen: BLOOD RIGHT ARM  Result Value Ref Range Status   Specimen Description BLOOD RIGHT ARM  Final   Special Requests   Final    BOTTLES DRAWN AEROBIC AND ANAEROBIC Blood Culture results may not be optimal due to an inadequate volume of blood received in culture bottles   Culture   Final    NO GROWTH 2 DAYS Performed at [facility], [address], [place], [place] [address]    Report Status PENDING  Incomplete  Blood Culture (routine x 2)     Status: None (Preliminary result)   Collection Time: 09/06/23  3:53 PM   Specimen: BLOOD  Result Value Ref Range Status   Specimen Description BLOOD LEFT ANTECUBITAL  Final   Special Requests   Final    BOTTLES DRAWN AEROBIC AND ANAEROBIC Blood Culture adequate volume   Culture   Final    NO GROWTH 2 DAYS  Performed at [facility], [address] [place], [place] [address]    Report Status PENDING  Incomplete  Resp panel by RT-PCR (RSV, Flu A&B, Covid) Anterior Nasal Swab     Status: None   Collection Time: 09/06/23  5:01 PM   Specimen: Anterior Nasal Swab  Result Value Ref Range Status   SARS Coronavirus 2 by RT PCR NEGATIVE NEGATIVE Final    Comment: (NOTE) SARS-CoV-2 target nucleic acids are NOT DETECTED.  The SARS-CoV-2 RNA is generally detectable in upper respiratory specimens during the acute phase of infection. The lowest concentration of SARS-CoV-2 viral copies this assay can detect is 138 copies/mL. A negative result does not preclude SARS-Cov-2 infection  and should not be used as the sole basis for treatment or other patient management decisions. A negative result may occur with  improper specimen collection/handling, submission of specimen other than nasopharyngeal swab, presence of viral mutation(s) within the areas targeted by this assay, and inadequate number of viral copies(<138 copies/mL). A negative result must be combined with clinical observations, patient history, and epidemiological information. The expected result is Negative.  Fact Sheet for Patients:  BloggerCourse.com  Fact Sheet for Healthcare Providers:  SeriousBroker.it  This test is no t yet approved or cleared by the [place] FDA and  has been authorized for detection and/or diagnosis of SARS-CoV-2 by FDA under an Emergency Use Authorization (EUA). This EUA will remain  in effect (meaning this test can be used) for the duration of the COVID-19 declaration under Section 564(b)(1) of the Act, 21 U.S.C.section 360bbb-3(b)(1), unless the authorization is terminated  or revoked sooner.       Influenza A by PCR NEGATIVE NEGATIVE Final   Influenza B by PCR NEGATIVE NEGATIVE Final    Comment: (NOTE) The Xpert Xpress SARS-CoV-2/FLU/RSV plus assay is intended as an aid in the diagnosis of influenza from Nasopharyngeal swab specimens and should not be used as a sole basis for treatment. Nasal washings and aspirates are unacceptable for Xpert Xpress SARS-CoV-2/FLU/RSV testing.  Fact Sheet for Patients: BloggerCourse.com  Fact Sheet for Healthcare Providers: SeriousBroker.it  This test is not yet approved or cleared by the [place] FDA and has been authorized for detection and/or diagnosis of SARS-CoV-2 by FDA under an Emergency Use Authorization (EUA). This EUA will remain in effect (meaning this test can be used) for the duration of the COVID-19 declaration  under Section 564(b)(1) of the Act, 21 U.S.C. section 360bbb-3(b)(1), unless the authorization is terminated or revoked.     Resp Syncytial Virus by PCR NEGATIVE NEGATIVE Final    Comment: (NOTE) Fact Sheet for Patients: BloggerCourse.com  Fact Sheet for Healthcare Providers: SeriousBroker.it  This test is not yet approved or cleared by the [place] FDA and has been authorized for detection and/or diagnosis of SARS-CoV-2 by FDA under an Emergency Use Authorization (EUA). This EUA will remain in effect (meaning this test can be used) for the duration of the COVID-19 declaration under Section 564(b)(1) of the Act, 21 U.S.C. section 360bbb-3(b)(1), unless the authorization is terminated or revoked.  Performed at [facility], [address] [place], [place] [address]     Coagulation Studies: Recent Labs    09/07/23 0502  LABPROT 15.2  INR 1.2    Urinalysis: Recent Labs    09/06/23 1847 09/08/23 1335  COLORURINE YELLOW* YELLOW*  LABSPEC 1.017 1.012  PHURINE 5.0 5.0  GLUCOSEU NEGATIVE NEGATIVE  HGBUR NEGATIVE NEGATIVE  BILIRUBINUR NEGATIVE NEGATIVE  KETONESUR NEGATIVE NEGATIVE  PROTEINUR NEGATIVE NEGATIVE  NITRITE NEGATIVE NEGATIVE  LEUKOCYTESUR NEGATIVE NEGATIVE      Imaging: No results found.    Medications:    cefTRIAXone (ROCEPHIN)  IV Stopped (09/08/23 1841)   metronidazole 500 mg (09/09/23 0838)    amLODipine  10 mg Oral Daily   aspirin EC  81 mg Oral Daily   enoxaparin (LOVENOX) injection  0.5 mg/kg Subcutaneous Q24H   feeding supplement  1 Container Oral TID BM   gabapentin  300 mg Oral TID   insulin aspart  0-15 Units Subcutaneous TID WC   insulin aspart  0-5 Units Subcutaneous QHS   methocarbamol  500 mg Oral BID   nortriptyline  10 mg Oral BID   pantoprazole  40 mg Oral Daily   predniSONE  10 mg Oral Q breakfast   rosuvastatin  20 mg Oral Daily   senna-docusate  1 tablet  Oral BID   tacrolimus  0.5 mg Oral q morning   tacrolimus  1 mg Oral QHS   tamsulosin  0.8 mg Oral Daily   trimethoprim  100 mg Oral Daily   acetaminophen **OR** acetaminophen, calcium carbonate, HYDROmorphone (DILAUDID) injection, magnesium hydroxide, ondansetron **OR** ondansetron (ZOFRAN) IV, oxyCODONE, traZODone  Assessment/ Plan:  [name] is a 68 y.o.  male with a PMHx of hypertension, coronary artery disease, congestive heart failure, diabetes, BPH, history of CVA, history of chronic kidney disease stage IIIa and a history of liver cirrhosis s/p transplant in 2009.  Patient now being admitted with history of diarrhea, abdominal pain and syncope/presyncopal episodes.  Patient required CT scan of the abdomen and pelvis with IV contrast to rule out any acute abdominal process. He has been on mycophenolate, tacrolimus and prednisone. He is also on Bactrim, furosemide and ACE inhibitor's.  Acute kidney injury on chronic kidney disease: Patient has baseline creatinine 1.6. Now the creatinine has gone up to 2.6. This may be secondary to prerenal azotemia. Patient also received IV contrast.  Creatinine continues to slowly improved.  Will continue to monitor during this admission.  Continue to avoid nephrotoxic agents and therapies, including hypotension.  Will schedule follow up in our office at discharge.   Lab Results  Component Value Date   CREATININE 2.11 (H) 09/09/2023   CREATININE 2.29 (H) 09/08/2023   CREATININE 2.28 (H) 09/07/2023    Intake/Output Summary (Last 24 hours) at 09/09/2023 1243 Last data filed at 09/09/2023 1233 Gross per 24 hour  Intake 3008.21 ml  Output 2350 ml  Net 658.21 ml    #2: Sepsis/Perirectal abscess and diarrhea: Continue present medications including Rocephin,  and Flagyl.   IV fluids stopped due to adequate oral intake.   #3: History of liver transplant: Can continue tacrolimus and prednisone.  Will hold the mycophenolate until diarrhea  resolves.   #4: Hypertension: Remains on amlodipine.  Lisinopril held   Blood pressure stable    LOS: 3 [place] 3/25/202512:43 PM

## 2023-09-09 NOTE — Progress Notes (Signed)
 Pharmacy Antibiotic Note  Devin White is a 68 y.o. male admitted on 09/06/2023 with  Necrotizing soft tissue infection perineum .  Pharmacy has been consulted for zosyn and vancomycin dosing.  -AKI on CKD-on Bactrim, furosemide and ACE inhibitor's PTA.   -s/p liver transplant 2009-on mycophenolate, tacrolimus and prednisone PTA.  -f/u I&D cx 3/25  Plan: Zosyn 3.375g IV q8h (4 hour infusion).  Vancomycin loading dose 2000mg  + 500mg  (total 2500mg ) Will continue with Vancomycin 1750 mg IV Q 24 hrs. Goal AUC 400-550. Expected AUC: 542 SCr used: 2.11 Cmin 15.1 Vd 0.72 for BMI </= 35  Follow up renal function, cultures, LOT, etc   Height: 6\' 1"  (185.4 cm) Weight: 120.6 kg (265 lb 14 oz) IBW/kg (Calculated) : 79.9  Temp (24hrs), Avg:98.3 F (36.8 C), Min:97.4 F (36.3 C), Max:99 F (37.2 C)  Recent Labs  Lab 09/06/23 1428 09/06/23 1553 09/06/23 2123 09/07/23 0502 09/08/23 0622 09/09/23 0720  WBC 26.8*  --   --  17.1* 12.3* 9.9  CREATININE 2.67*  --   --  2.28* 2.29* 2.11*  LATICACIDVEN  --  1.8 1.2  --   --   --     Estimated Creatinine Clearance: 47.5 mL/min (A) (by C-G formula based on SCr of 2.11 mg/dL (H)).    Allergies  Allergen Reactions   Levaquin [Levofloxacin] Shortness Of Breath    SOB and severe leg pain/weakness    Antimicrobials this admission: - 3/23 Ceftriaxone + 3/22 metronidazole>> 3/25 Zosyn  3/25 >>   vancomycin 3/25 >>    Dose adjustments this admission:    Microbiology results: 3/22 BCx: NGTD   UCx:      Sputum:      MRSA PCR:   3/25 abscess cxs: pending  Thank you for allowing pharmacy to be a part of this patient's care.  Bari Mantis PharmD Clinical Pharmacist 09/09/2023

## 2023-09-09 NOTE — Op Note (Signed)
  09/09/2023  3:42 PM  PATIENT:  Devin White  68 y.o. male  PRE-OPERATIVE DIAGNOSIS:  Necrotizing soft tissue infection perineum  POST-OPERATIVE DIAGNOSIS:  Same  PROCEDURE:  1. Incision and drainage of complex perineal abscess with wide debridement of skin subcutaneous tissue and fascia    SURGEON:  Surgeons and Role:    * Dewight Catino, Merri Ray, MD - Primary  FINDINGS: Necrotizing soft tissue infection of perineum crossing midline. Defect measures 10 x 3 cms and goes all the way to the muscle. This was early infection and fortunately he did not require massive debridement  ANESTHESIA: GETA  EBL: minimal   DICTATION:  Patient was explained about the procedure in detail, risk benefits, possible complications and a consent was obtained. The patient taken to the operating room and placed in the lithotomy position with all pressure points padded.  Elliptical incision created over the right side of the perineum, we drained malodorous dishwater fluid, there was evidence of necrotizing infection down to the fascia the muscles were intact, I debrided  the fascia with a curette, the cavity tracked toward the left side as well with some additional purulent fluid drained. Appropriate cultures obtained. There was complex luculations that we were able to lyse with a combination of finger fracture and suction device. All the loculations were broken down. Hemostasis was obtained with electrocautery. Irrigation with normal saline and irricept and the wound was packed with kerlix packing. . Needle and laparotomy counts were correct and there were no immediate complications  Leafy Ro, MD

## 2023-09-09 NOTE — Progress Notes (Signed)
 PROGRESS NOTE    Devin White  NWG:[id number] DOB: 1955/10/13 DOA: 09/06/2023 PCP: Karie Schwalbe, MD    Brief Narrative:    68 y.o. male with medical history significant for liver cirrhosis and hepatic cell failure status post liver transplant in 2009 on immunosuppressive therapy, CHF, GERD, BPH, essential hypertension, CVA, stage IIIa chronic kidney disease, gout, and osteoarthritis, who presented to the emergency room with acute onset of presyncope.  He stated that he has been feeling sick for couple weeks.  He did have a syncopal episode about a couple weeks ago.  He has not been feeling well since then generalized weakness and fatigue intermittently.  Last week he had recurrent diarrhea for about 17 episodes.  He denies any melena or bright red bleeding per rectum.  Started having worsening abdominal pain today more on the right side and noticed a small area near his rectum that has significant increased in size over the last couple of days.  He admitted to tactile fever and chills.  He has been having dry cough with no dyspnea or wheezing.  No chest pain or palpitations.  No nausea or vomiting.  Denies any trauma or injuries but was having headache.  He has mild dysuria without hematuria urgency or frequency or flank pain.    Assessment & Plan:   Principal Problem:   Sepsis due to undetermined organism [facility]) Active Problems:   Near syncope   Acute kidney injury superimposed on chronic kidney disease (HCC)   Type 2 diabetes mellitus with peripheral neuropathy (HCC)   Essential hypertension   Gout   Dyslipidemia   Perianal abscess   * Sepsis due to undetermined organism (HCC) Suspect GI pathogen Possible perirectal abscess  Patient has a history of C. difficile colitis.  Last course was approximately 2 years ago.  No recent exposure to antibiotics however the patient is status post liver transplant on chronic immunosuppression.  Case discussed with general surgery regarding  possible perirectal abscess.  Recommends empiric antibiotic therapy.  No recommendations for surgical intervention at this time. - Patient has not been having diarrhea -3/25: Gas noted on CT of the pelvis on 3/25 concerning for necrotizing infection. Plan: Continue IV antibiotics.  Continue IV fluids.  Made NPO.  Urgent surgical exploration planned for today.  Near syncope Suspect intravascular volume depletion in the setting of diarrheal illness/sepsis.   Treatment as above.   IV hydration as above. Engage therapy services  Acute kidney injury superimposed on chronic kidney disease (HCC) Kidney function improving.   Continue intravenous fluid resuscitation.   Avoid nonessential nephrotoxins. CellCept currently on hold.  Restart as soon as safe   Type 2 diabetes mellitus with peripheral neuropathy (HCC) Hold oral agents.   Hold long-acting for now.   Sliding scale coverage.   Carb modified diet.   Continue Neurontin.   Essential hypertension Continue amlodipine.   Hold lisinopril   Dyslipidemia Continue statin   Gout No evidence of exacerbation  Status post liver transplant Chronic immunosuppression Resume immunosuppressive therapy Cellcept temporarily on hold  Obesity BMI 35.08.   Complicating factor in overall care and prognosis.   DVT prophylaxis: Lovenox Code Status: Full Family Communication: None Disposition Plan: Status is: Inpatient Remains inpatient appropriate because: Sepsis, diarrheal illness   Level of care: Telemetry Medical  Consultants:  General surgery  Procedures:  None  Antimicrobials: Rocephin Flagyl   Subjective: Seen and examined.  Issues with urinary retention overnight.  Required In-N-Out catheterization with over 1000 cc of  urine liberated.  Complains of pelvic pain.  Objective: Vitals:   09/08/23 1613 09/08/23 2031 09/09/23 0429 09/09/23 0759  BP: 127/70 131/78 136/82 (!) 155/86  Pulse: 82 81 87 97  Resp: 16 18 18 18    Temp: 98.7 F (37.1 C) 98.1 F (36.7 C) (!) 97.4 F (36.3 C) 99 F (37.2 C)  TempSrc: Oral     SpO2: 93% 92% (!) 87% (!) 89%  Weight:      Height:        Intake/Output Summary (Last 24 hours) at 09/09/2023 1119 Last data filed at 09/09/2023 0900 Gross per 24 hour  Intake 3008.21 ml  Output 1775 ml  Net 1233.21 ml   Filed Weights   09/06/23 1425 09/06/23 2014  Weight: 117.9 kg 120.6 kg    Examination:  General exam: No acute distress Respiratory system: Lungs clear.  Normal work of breathing.  Room air Cardiovascular system: S1-S2, RRR, no murmurs, no pedal edema Gastrointestinal system: Obese, soft, tender to palpation lower abdomen, nondistended, hyperactive bowel sounds Central nervous system: Alert and oriented. No focal neurological deficits. Extremities: Symmetric 5 x 5 power. Skin: No rashes, lesions or ulcers Psychiatry: Judgement and insight appear normal. Mood & affect appropriate.     Data Reviewed: I have personally reviewed following labs and imaging studies  CBC: Recent Labs  Lab 09/06/23 1428 09/07/23 0502 09/08/23 0622 09/09/23 0720  WBC 26.8* 17.1* 12.3* 9.9  HGB 15.2 12.6* 12.0* 11.8*  HCT 45.2 38.0* 34.6* 35.4*  MCV 94.8 95.2 93.5 93.7  PLT 183 111* 107* 120*   Basic Metabolic Panel: Recent Labs  Lab 09/06/23 1428 09/07/23 0502 09/08/23 0622 09/09/23 0720  NA 134* 139 139 140  K 4.8 4.5 4.0 3.9  CL 109 109 108 108  CO2 20* 24 22 25   GLUCOSE 172* 89 97 105*  BUN 37* 35* 27* 21  CREATININE 2.67* 2.28* 2.29* 2.11*  CALCIUM 9.0 8.5* 8.5* 8.5*   GFR: Estimated Creatinine Clearance: 47.5 mL/min (A) (by C-G formula based on SCr of 2.11 mg/dL (H)). Liver Function Tests: Recent Labs  Lab 09/06/23 1428  AST 23  ALT 22  ALKPHOS 37*  BILITOT 2.7*  PROT 6.8  ALBUMIN 4.0   Recent Labs  Lab 09/06/23 1428  LIPASE 28   No results for input(s): "AMMONIA" in the last 168 hours. Coagulation Profile: Recent Labs  Lab 09/07/23 0502   INR 1.2   Cardiac Enzymes: No results for input(s): "CKTOTAL", "CKMB", "CKMBINDEX", "TROPONINI" in the last 168 hours. BNP (last 3 results) No results for input(s): "PROBNP" in the last 8760 hours. HbA1C: No results for input(s): "HGBA1C" in the last 72 hours. CBG: Recent Labs  Lab 09/08/23 0728 09/08/23 1202 09/08/23 1518 09/08/23 2029 09/09/23 0800  GLUCAP 88 196* 206* 151* 101*   Lipid Profile: No results for input(s): "CHOL", "HDL", "LDLCALC", "TRIG", "CHOLHDL", "LDLDIRECT" in the last 72 hours. Thyroid Function Tests: No results for input(s): "TSH", "T4TOTAL", "FREET4", "T3FREE", "THYROIDAB" in the last 72 hours. Anemia Panel: No results for input(s): "VITAMINB12", "FOLATE", "FERRITIN", "TIBC", "IRON", "RETICCTPCT" in the last 72 hours. Sepsis Labs: Recent Labs  Lab 09/06/23 1553 09/06/23 2123  LATICACIDVEN 1.8 1.2    Recent Results (from the past 240 hours)  Blood Culture (routine x 2)     Status: None (Preliminary result)   Collection Time: 09/06/23  3:53 PM   Specimen: BLOOD RIGHT ARM  Result Value Ref Range Status   Specimen Description BLOOD RIGHT ARM  Final  Special Requests   Final    BOTTLES DRAWN AEROBIC AND ANAEROBIC Blood Culture results may not be optimal due to an inadequate volume of blood received in culture bottles   Culture   Final    NO GROWTH 2 DAYS Performed at [facility], [address] [place], [place] [address]    Report Status PENDING  Incomplete  Blood Culture (routine x 2)     Status: None (Preliminary result)   Collection Time: 09/06/23  3:53 PM   Specimen: BLOOD  Result Value Ref Range Status   Specimen Description BLOOD LEFT ANTECUBITAL  Final   Special Requests   Final    BOTTLES DRAWN AEROBIC AND ANAEROBIC Blood Culture adequate volume   Culture   Final    NO GROWTH 2 DAYS Performed at [facility], [address], [place], [place] [address]    Report Status PENDING  Incomplete  Resp panel by RT-PCR  (RSV, Flu A&B, Covid) Anterior Nasal Swab     Status: None   Collection Time: 09/06/23  5:01 PM   Specimen: Anterior Nasal Swab  Result Value Ref Range Status   SARS Coronavirus 2 by RT PCR NEGATIVE NEGATIVE Final    Comment: (NOTE) SARS-CoV-2 target nucleic acids are NOT DETECTED.  The SARS-CoV-2 RNA is generally detectable in upper respiratory specimens during the acute phase of infection. The lowest concentration of SARS-CoV-2 viral copies this assay can detect is 138 copies/mL. A negative result does not preclude SARS-Cov-2 infection and should not be used as the sole basis for treatment or other patient management decisions. A negative result may occur with  improper specimen collection/handling, submission of specimen other than nasopharyngeal swab, presence of viral mutation(s) within the areas targeted by this assay, and inadequate number of viral copies(<138 copies/mL). A negative result must be combined with clinical observations, patient history, and epidemiological information. The expected result is Negative.  Fact Sheet for Patients:  BloggerCourse.com  Fact Sheet for Healthcare Providers:  SeriousBroker.it  This test is no t yet approved or cleared by the [place] FDA and  has been authorized for detection and/or diagnosis of SARS-CoV-2 by FDA under an Emergency Use Authorization (EUA). This EUA will remain  in effect (meaning this test can be used) for the duration of the COVID-19 declaration under Section 564(b)(1) of the Act, 21 U.S.C.section 360bbb-3(b)(1), unless the authorization is terminated  or revoked sooner.       Influenza A by PCR NEGATIVE NEGATIVE Final   Influenza B by PCR NEGATIVE NEGATIVE Final    Comment: (NOTE) The Xpert Xpress SARS-CoV-2/FLU/RSV plus assay is intended as an aid in the diagnosis of influenza from Nasopharyngeal swab specimens and should not be used as a sole basis for  treatment. Nasal washings and aspirates are unacceptable for Xpert Xpress SARS-CoV-2/FLU/RSV testing.  Fact Sheet for Patients: BloggerCourse.com  Fact Sheet for Healthcare Providers: SeriousBroker.it  This test is not yet approved or cleared by the [place] FDA and has been authorized for detection and/or diagnosis of SARS-CoV-2 by FDA under an Emergency Use Authorization (EUA). This EUA will remain in effect (meaning this test can be used) for the duration of the COVID-19 declaration under Section 564(b)(1) of the Act, 21 U.S.C. section 360bbb-3(b)(1), unless the authorization is terminated or revoked.     Resp Syncytial Virus by PCR NEGATIVE NEGATIVE Final    Comment: (NOTE) Fact Sheet for Patients: BloggerCourse.com  Fact Sheet for Healthcare Providers: SeriousBroker.it  This test is not  yet approved or cleared by the [place] and has been authorized for detection and/or diagnosis of SARS-CoV-2 by FDA under an Emergency Use Authorization (EUA). This EUA will remain in effect (meaning this test can be used) for the duration of the COVID-19 declaration under Section 564(b)(1) of the Act, 21 U.S.C. section 360bbb-3(b)(1), unless the authorization is terminated or revoked.  Performed at [facility], [address] [place], [place] [address]          Radiology Studies: No results found.       Scheduled Meds:  amLODipine  10 mg Oral Daily   aspirin EC  81 mg Oral Daily   enoxaparin (LOVENOX) injection  0.5 mg/kg Subcutaneous Q24H   feeding supplement  1 Container Oral TID BM   gabapentin  300 mg Oral TID   insulin aspart  0-15 Units Subcutaneous TID WC   insulin aspart  0-5 Units Subcutaneous QHS   methocarbamol  500 mg Oral BID   nortriptyline  10 mg Oral BID   pantoprazole  40 mg Oral Daily   predniSONE  10 mg Oral Q breakfast    rosuvastatin  20 mg Oral Daily   senna-docusate  1 tablet Oral BID   tacrolimus  0.5 mg Oral q morning   tacrolimus  1 mg Oral QHS   tamsulosin  0.8 mg Oral Daily   trimethoprim  100 mg Oral Daily   Continuous Infusions:  cefTRIAXone (ROCEPHIN)  IV Stopped (09/08/23 1841)   lactated ringers 100 mL/hr at 09/09/23 0413   metronidazole 500 mg (09/09/23 0838)     LOS: 3 days   Tresa Moore, MD [facility]   If 7PM-7AM, please contact night-coverage  09/09/2023, 11:19 AM

## 2023-09-09 NOTE — TOC Initial Note (Signed)
 Transition of Care [facility]) - Initial/Assessment Note    Patient Details  Name: Devin White MRN: [id number] Date of Birth: Dec 03, 1955  [facility]) CM/SW Contact:    Cherre Blanc, RN Phone Number: 09/09/2023, 9:47 AM  Clinical Narrative:                 Patient lives independently in [place], a community for people age 65 an over. He lives in an apartment on one level. His bathroom is fitted with grab bars in the shower and by to the toilet. He also noted that he has a string he can pull in the event of emergency. He has a daughter in [place], [place] and one in [place], [place] who visit him frequently. He also has grandaughters and a sister who check in on him. He currently drives an will ask family to drive him home at discharge. He has a PCP and takes his medication independently.  Therapy recommends SNF for inpatient therapy, but the patient's preference is to return home with [facility] PT/OT. He is also requesting a rollator walker.   TOC will continue to follow for dc planning.    Expected Discharge Plan: Home w Home Health Services Barriers to Discharge: Continued Medical Work up   Patient Goals and CMS Choice            Expected Discharge Plan and Services   Discharge Planning Services: CM Consult   Living arrangements for the past 2 months: Single Family Home                                      Prior Living Arrangements/Services Living arrangements for the past 2 months: Single Family Home Lives with:: Self              Current home services: DME (rollator, RW, 3 in 1)    Activities of Daily Living   ADL Screening (condition at time of admission) Independently performs ADLs?: Yes (appropriate for developmental age) Is the patient deaf or have difficulty hearing?: No Does the patient have difficulty seeing, even when wearing glasses/contacts?: No Does the patient have difficulty concentrating, remembering, or making decisions?:  No  Permission Sought/Granted                  Emotional Assessment Appearance:: Appears stated age Attitude/Demeanor/Rapport: Engaged Affect (typically observed): Appropriate Orientation: : Oriented to Place, Oriented to Self, Oriented to  Time, Oriented to Situation   Psych Involvement: No (comment)  Admission diagnosis:  AKI (acute kidney injury) (HCC) [N17.9] Sepsis due to undetermined organism (HCC) [A41.9] Syncope, unspecified syncope type [R55] Patient Active Problem List   Diagnosis Date Noted   Perianal abscess 09/07/2023   Sepsis due to undetermined organism (HCC) 09/06/2023   Acute kidney injury superimposed on chronic kidney disease (HCC) 09/06/2023   Near syncope 09/06/2023   Essential hypertension 09/06/2023   Type 2 diabetes mellitus with peripheral neuropathy (HCC) 09/06/2023   Gout 09/06/2023   Dyslipidemia 09/06/2023   Cervical radiculopathy 05/01/2023   Diabetes mellitus treated with insulin and oral medication (HCC) 05/01/2023   Hyperosmolar hyperglycemic state (HHS) (HCC) 04/18/2023   hx of stroke 04/18/2023   Leukocytosis 04/18/2023   Type II diabetes mellitus with renal manifestations (HCC) 04/18/2023   HLD (hyperlipidemia) 04/18/2023   Newly diagnosed diabetes (HCC) 02/14/2023   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) 11/27/2022  Acquired thrombophilia (HCC) 11/27/2022   Elevated TSH 11/27/2022   Postural dizziness with presyncope 11/16/2022   History of superior mesenteric vein thrombosis, postprocedural 2022 11/16/2022   History of recent stroke 11/16/2022   Pain of both sacroiliac joints 11/06/2022   Elevated liver function tests 11/06/2022   Left sided numbness 11/14/2021   Obesity (BMI 30-39.9) 10/27/2021   Stage 3a chronic kidney disease (CKD) (HCC) 10/26/2021   Hyperbilirubinemia 10/26/2021   Chronic diastolic heart failure (HCC) 09/05/2021   Gastric intestinal metaplasia 01/01/2021   Sick  euthyroidism 10/03/2020   Achilles tendon mass 09/25/2020   Recurrent UTI 03/26/2019   Pulmonary nodule 08/04/2018   History of liver transplant (HCC) 10/10/2017   Advance directive discussed with patient 10/10/2017   Stage 3b chronic kidney disease (HCC) 10/09/2016   Mallory-Weiss tear 04/16/2016   Long-term use of immunosuppressant medication 04/08/2016   Sleep apnea 10/06/2015   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   Immunosuppression (HCC) 06/01/2012   Hypertension    Routine general medical examination at a health care facility 04/26/2011   Chronic tophaceous gout 12/21/2008   BPH (benign prostatic hyperplasia) 12/21/2008   ALLERGIC RHINITIS 05/22/2007   GERD 05/22/2007   HIATAL HERNIA 05/22/2007   IRRITABLE BOWEL SYNDROME, HX OF 05/22/2007   RENAL CALCULUS, HX OF 05/22/2007   PCP:  Karie Schwalbe, MD Pharmacy:   [id number] [place], [place] - [address] AT [facility] [address] [place] [address] Phone: [phone] Fax: [phone]     Social Drivers of Health (SDOH) Social History: SDOH Screenings   Food Insecurity: No Food Insecurity (09/06/2023)  Housing: Low Risk  (09/07/2023)  Recent Concern: Housing - High Risk (07/28/2023)  Transportation Needs: No Transportation Needs (09/06/2023)  Utilities: Not At Risk (09/06/2023)  Depression (PHQ2-9): Low Risk  (02/27/2023)  Financial Resource Strain: Low Risk  (07/28/2023)  Physical Activity: Insufficiently Active (07/28/2023)  Social Connections: Socially Isolated (09/06/2023)  Stress: No Stress Concern Present (07/28/2023)  Tobacco Use: Low Risk  (09/06/2023)   SDOH Interventions:     Readmission Risk Interventions    11/09/2022    1:38 PM 01/31/2022    9:58 AM 11/06/2021   11:09 AM  Readmission Risk Prevention Plan  Transportation Screening Complete Complete Complete  PCP or Specialist Appt within 5-7 Days Complete   Complete  PCP or Specialist Appt within 3-5 Days  Complete   Home Care Screening Complete  Complete  Medication Review (RN CM) Referral to Pharmacy  Referral to Pharmacy  Social Work Consult for Recovery Care Planning/Counseling  Complete   Palliative Care Screening  Not Applicable   Medication Review Oceanographer)  Complete

## 2023-09-10 ENCOUNTER — Encounter: Payer: Self-pay | Admitting: Surgery

## 2023-09-10 DIAGNOSIS — E66812 Obesity, class 2: Secondary | ICD-10-CM

## 2023-09-10 DIAGNOSIS — A419 Sepsis, unspecified organism: Secondary | ICD-10-CM | POA: Diagnosis not present

## 2023-09-10 LAB — CBC WITH DIFFERENTIAL/PLATELET
Abs Immature Granulocytes: 0.14 10*3/uL — ABNORMAL HIGH (ref 0.00–0.07)
Basophils Absolute: 0 10*3/uL (ref 0.0–0.1)
Basophils Relative: 0 %
Eosinophils Absolute: 0.4 10*3/uL (ref 0.0–0.5)
Eosinophils Relative: 3 %
HCT: 34 % — ABNORMAL LOW (ref 39.0–52.0)
Hemoglobin: 11.4 g/dL — ABNORMAL LOW (ref 13.0–17.0)
Immature Granulocytes: 1 %
Lymphocytes Relative: 12 %
Lymphs Abs: 1.3 10*3/uL (ref 0.7–4.0)
MCH: 31.5 pg (ref 26.0–34.0)
MCHC: 33.5 g/dL (ref 30.0–36.0)
MCV: 93.9 fL (ref 80.0–100.0)
Monocytes Absolute: 0.9 10*3/uL (ref 0.1–1.0)
Monocytes Relative: 8 %
Neutro Abs: 8.8 10*3/uL — ABNORMAL HIGH (ref 1.7–7.7)
Neutrophils Relative %: 76 %
Platelets: 147 10*3/uL — ABNORMAL LOW (ref 150–400)
RBC: 3.62 MIL/uL — ABNORMAL LOW (ref 4.22–5.81)
RDW: 14.6 % (ref 11.5–15.5)
WBC: 11.6 10*3/uL — ABNORMAL HIGH (ref 4.0–10.5)
nRBC: 0 % (ref 0.0–0.2)

## 2023-09-10 LAB — GLUCOSE, CAPILLARY
Glucose-Capillary: 114 mg/dL — ABNORMAL HIGH (ref 70–99)
Glucose-Capillary: 176 mg/dL — ABNORMAL HIGH (ref 70–99)
Glucose-Capillary: 207 mg/dL — ABNORMAL HIGH (ref 70–99)
Glucose-Capillary: 185 mg/dL — ABNORMAL HIGH (ref 70–99)

## 2023-09-10 LAB — COMPREHENSIVE METABOLIC PANEL
ALT: 21 U/L (ref 0–44)
AST: 17 U/L (ref 15–41)
Albumin: 2.7 g/dL — ABNORMAL LOW (ref 3.5–5.0)
Alkaline Phosphatase: 50 U/L (ref 38–126)
Anion gap: 9 (ref 5–15)
BUN: 21 mg/dL (ref 8–23)
CO2: 24 mmol/L (ref 22–32)
Calcium: 8.1 mg/dL — ABNORMAL LOW (ref 8.9–10.3)
Chloride: 104 mmol/L (ref 98–111)
Creatinine, Ser: 2.36 mg/dL — ABNORMAL HIGH (ref 0.61–1.24)
GFR, Estimated: 30 mL/min — ABNORMAL LOW (ref >60)
Glucose, Bld: 157 mg/dL — ABNORMAL HIGH (ref 70–99)
Potassium: 4.4 mmol/L (ref 3.5–5.1)
Sodium: 137 mmol/L (ref 135–145)
Total Bilirubin: 1.2 mg/dL (ref 0.0–1.2)
Total Protein: 6.1 g/dL — ABNORMAL LOW (ref 6.5–8.1)

## 2023-09-10 LAB — CREATININE, SERUM
Creatinine, Ser: 2.44 mg/dL — ABNORMAL HIGH (ref 0.61–1.24)
GFR, Estimated: 29 mL/min — ABNORMAL LOW (ref >60)

## 2023-09-10 MED ORDER — VANCOMYCIN HCL 1250 MG/250ML IV SOLN
1250.0000 mg | INTRAVENOUS | Status: DC
  Administered 2023-09-10: 1250 mg via INTRAVENOUS
  Filled 2023-09-10: qty 250

## 2023-09-10 MED ORDER — MENTHOL 3 MG MT LOZG
1.0000 | LOZENGE | OROMUCOSAL | Status: DC | PRN
  Administered 2023-09-10 (×2): 3 mg via ORAL
  Filled 2023-09-10: qty 9

## 2023-09-10 NOTE — Progress Notes (Signed)
 Progress Note   Patient: Devin White:[id number] DOB: August 12, 1955 DOA: 09/06/2023     4 DOS: the patient was seen and examined on 09/10/2023   Brief hospital course: 67yo with h/o cirrhosis s/p liver transplant (2009) on immunosuppression, chronic HFpEF, GERD, BPH, HTN, CVA, stage 3a CKD, and gout who presented on 3/22 with presyncope.  He was determined to have sepsis due to perirectal abscess and necrotizing soft tissue infection of the perineum and underwent I&D and wide debridement on 3/25.    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 difficile colitis, most recently approximately 2 years ago No recent exposure to antibiotics however the patient is status post liver transplant on chronic immunosuppression Concern for perirectal abscess General surgery consulted Gas noted on CT of the pelvis on 3/25 concerning for necrotizing infection Underwent urgent surgical exploration 3/25 Continue IV antibiotics (Zosyn, Vanc) Surgery still following, will reassess tomorrow for possible further debridement BID wet-to-dry dressing changes with Dakins   Near syncope Suspect intravascular volume depletion in the setting of sepsis   Anticipate improvement with clinical condition Engage therapy services   Acute kidney injury superimposed on stage 3a chronic kidney disease Baseline creatinine 1.6, GFR 45-50 Presenting creatinine 2.67, GFR 26 Slowly improving Avoid nonessential nephrotoxins. CellCept currently on hold - restart as soon as safe   Type 2 diabetes mellitus with peripheral neuropathy Last A1c was 10.6, very poor control Hold oral agents Hold long-acting insulin for now   Sliding scale coverage Carb modified diet Continue gabapentin   Essential hypertension Continue amlodipine Hold lisinopril   Dyslipidemia Continue statin   Gout No evidence of exacerbation   Status post liver transplant Chronic immunosuppression Resume  immunosuppressive therapy Cellcept temporarily on hold   Class 2 Obesity Body mass index is 35.08 kg/m.Marland Kitchen  Weight loss should be encouraged Outpatient PCP/bariatric medicine f/u encouraged     Consultants: Surgery Nephrology PT OT  Procedures: I&D of complex perineal abscess with wide debridement, 3/25  Antibiotics: Cefepime x 1 Cefotetan x 1 Ceftriaxone x 2  Zosyn 3/25- Vancomycin 3/25-  30 Day Unplanned Readmission Risk Score    Flowsheet Row ED to Hosp-Admission (Current) from 09/06/2023 in [facility] 1C MEDICAL TELEMETRY  30 Day Unplanned Readmission Risk Score (%) 25.49 Filed at 09/10/2023 0801       This score is the patient's risk of an unplanned readmission within 30 days of being discharged (0 -100%). The score is based on dignosis, age, lab data, medications, orders, and past utilization.   Low:  0-14.9   Medium: 15-21.9   High: 22-29.9   Extreme: 30 and above           Subjective: Ongoing pain is relatively controlled.  No fevers.  Has not voided, still needing I/O cath.   Objective: Vitals:   09/10/23 0407 09/10/23 0734  BP: 116/70 123/75  Pulse: 74 80  Resp: 16 18  Temp: 98.1 F (36.7 C) 97.9 F (36.6 C)  SpO2: 93% 93%    Intake/Output Summary (Last 24 hours) at 09/10/2023 1408 Last data filed at 09/10/2023 1337 Gross per 24 hour  Intake 1980 ml  Output 1605 ml  Net 375 ml   Filed Weights   09/06/23 1425 09/06/23 2014  Weight: 117.9 kg 120.6 kg    Exam:  General:  Appears calm and comfortable and is in NAD Eyes:   EOMI, normal lids, iris ENT:  grossly normal hearing, lips & tongue,  mmm Cardiovascular:  RRR, no m/r/g. No LE edema.  Respiratory:   CTA bilaterally with no wheezes/rales/rhonchi.  Normal respiratory effort. Abdomen:  soft, NT, ND Skin:  no rash or induration seen on limited exam Musculoskeletal:  grossly normal tone BUE/BLE, good ROM, no bony abnormality Psychiatric:  grossly normal mood and  affect, speech fluent and appropriate, AOx3 Neurologic:  CN 2-12 grossly intact, moves all extremities in coordinated fashion  Data Reviewed: I have reviewed the patient's lab results since admission.  Pertinent labs for today include:   Glucose 157 BUN 21/Creatinine 2.36/GFR 30, stable Albumin 2.7 WBC 11.6, down from 26.8 Hgb 11.4, down from 15.2 Platelets 147, improved Wound cultures pending    Family Communication: None present  Disposition: Status is: Inpatient Remains inpatient appropriate because: ongoing management     Time spent: 50 minutes  Unresulted Labs (From admission, onward)     Start     Ordered   09/11/23 0500  CBC with Differential/Platelet  Tomorrow morning,   R       Question:  Specimen collection method  Answer:  Lab=Lab collect   09/10/23 0822   09/11/23 0500  Basic metabolic panel  Tomorrow morning,   R       Question:  Specimen collection method  Answer:  Lab=Lab collect   09/10/23 1610             Author: Jonah Blue, MD 09/10/2023 2:08 PM  For on call review [email or web address].

## 2023-09-10 NOTE — Progress Notes (Signed)
 Physical Therapy Treatment Patient Details Name: Devin White MRN: [id number] DOB: 1956-03-11 Today's Date: 09/10/2023   History of Present Illness 68 y/o male presented to ED on 09/06/23 for presyncope and rectal pain. Admitted for sepsis due to undetermined organism. PMH: liver cirrhosis s/p liver transplant, CHF, CKD stage IIIa, HTN    PT Comments  Patient semi reclined in bed on arrival and reporting pain with attempts of OOB. Able to complete bed mobility modI with HOB elevated. Sits on EOB but unable to tolerate prolonged sitting 2/2 groin and rectum pain. Ambulated within room with RW and CGA with B knee flexed throughout. VSS on RA. Discharge plan remains appropriate.     If plan is discharge home, recommend the following: A little help with walking and/or transfers;A little help with bathing/dressing/bathroom;Assistance with cooking/housework;Assist for transportation;Help with stairs or ramp for entrance   Can travel by private vehicle     Yes  Equipment Recommendations  Rollator (4 wheels)    Recommendations for Other Services       Precautions / Restrictions Precautions Precautions: Fall Recall of Precautions/Restrictions: Intact Restrictions Weight Bearing Restrictions Per Provider Order: No     Mobility  Bed Mobility Overal bed mobility: Needs Assistance Bed Mobility: Rolling, Sidelying to Sit, Sit to Sidelying Rolling: Modified independent (Device/Increase time) Sidelying to sit: Modified independent (Device/Increase time), HOB elevated     Sit to sidelying: Modified independent (Device/Increase time)      Transfers Overall transfer level: Needs assistance Equipment used: Rolling Chieko Neises (2 wheels) Transfers: Sit to/from Stand Sit to Stand: Contact guard assist                Ambulation/Gait Ambulation/Gait assistance: Contact guard assist Gait Distance (Feet): 25 Feet Assistive device: Rolling Cathi Hazan (2 wheels) Gait Pattern/deviations:  Step-to pattern, Decreased stride length, Knee flexed in stance - left Gait velocity: decreased     General Gait Details: CGA for safety. L knee flexed in stance throughout   Stairs             Wheelchair Mobility     Tilt Bed    Modified Rankin (Stroke Patients Only)       Balance Overall balance assessment: Needs assistance Sitting-balance support: Feet supported, No upper extremity supported, Single extremity supported Sitting balance-Leahy Scale: Fair     Standing balance support: Bilateral upper extremity supported, Reliant on assistive device for balance Standing balance-Leahy Scale: Poor                              Communication Communication Communication: No apparent difficulties;Other (comment) (tangential)  Cognition Arousal: Alert Behavior During Therapy: WFL for tasks assessed/performed   PT - Cognitive impairments: No apparent impairments                         Following commands: Intact      Cueing    Exercises      General Comments        Pertinent Vitals/Pain Pain Assessment Pain Assessment: Faces Faces Pain Scale: Hurts whole lot Pain Location: groin and rectum Pain Descriptors / Indicators: Discomfort, Sharp, Guarding, Grimacing Pain Intervention(s): Limited activity within patient's tolerance, Monitored during session, Repositioned    Home Living                          Prior Function  PT Goals (current goals can now be found in the care plan section) Acute Rehab PT Goals Patient Stated Goal: to get stronger PT Goal Formulation: With patient Time For Goal Achievement: 09/22/23 Potential to Achieve Goals: Good Progress towards PT goals: Progressing toward goals    Frequency    Min 3X/week      PT Plan      Co-evaluation              AM-PAC PT "6 Clicks" Mobility   Outcome Measure  Help needed turning from your back to your side while in a flat bed without  using bedrails?: None Help needed moving from lying on your back to sitting on the side of a flat bed without using bedrails?: None Help needed moving to and from a bed to a chair (including a wheelchair)?: A Little Help needed standing up from a chair using your arms (e.g., wheelchair or bedside chair)?: A Little Help needed to walk in hospital room?: A Little Help needed climbing 3-5 steps with a railing? : A Little 6 Click Score: 20    End of Session   Activity Tolerance: Patient tolerated treatment well Patient left: in bed;with call bell/phone within reach;with bed alarm set Nurse Communication: Mobility status PT Visit Diagnosis: Unsteadiness on feet (R26.81);Muscle weakness (generalized) (M62.81);Other abnormalities of gait and mobility (R26.89)     Time: 1610-9604 PT Time Calculation (min) (ACUTE ONLY): 20 min  Charges:    $Therapeutic Activity: 8-22 mins PT General Charges $$ ACUTE PT VISIT: 1 Visit                     Maylon Peppers, PT, DPT Physical Therapist - [facility]  [facility]    Vyron Fronczak A Azarah Dacy 09/10/2023, 1:17 PM

## 2023-09-10 NOTE — Anesthesia Postprocedure Evaluation (Signed)
 Anesthesia Post Note  Patient: Devin White  Procedure(s) Performed: INCISION AND DRAINAGE, ABSCESS (Perineum)  Patient location during evaluation: PACU Anesthesia Type: General Level of consciousness: awake and alert Pain management: pain level controlled Vital Signs Assessment: post-procedure vital signs reviewed and stable Respiratory status: spontaneous breathing, nonlabored ventilation, respiratory function stable and patient connected to nasal cannula oxygen Cardiovascular status: blood pressure returned to baseline and stable Postop Assessment: no apparent nausea or vomiting Anesthetic complications: no   No notable events documented.   Last Vitals:  Vitals:   09/09/23 2139 09/10/23 0407  BP: 110/72 116/70  Pulse: 79 74  Resp: 18 16  Temp: 36.6 C 36.7 C  SpO2: 91% 93%    Last Pain:  Vitals:   09/10/23 6440  TempSrc:   PainSc: 4                  Stephanie Coup

## 2023-09-10 NOTE — Plan of Care (Signed)
  Problem: Fluid Volume: Goal: Hemodynamic stability will improve Outcome: Progressing   Problem: Clinical Measurements: Goal: Diagnostic test results will improve Outcome: Progressing Goal: Signs and symptoms of infection will decrease Outcome: Progressing   Problem: Respiratory: Goal: Ability to maintain adequate ventilation will improve Outcome: Progressing   Problem: Education: Goal: Knowledge of General Education information will improve Description: Including pain rating scale, medication(s)/side effects and non-pharmacologic comfort measures Outcome: Progressing   Problem: Health Behavior/Discharge Planning: Goal: Ability to manage health-related needs will improve Outcome: Progressing   Problem: Clinical Measurements: Goal: Ability to maintain clinical measurements within normal limits will improve Outcome: Progressing Goal: Will remain free from infection Outcome: Progressing Goal: Diagnostic test results will improve Outcome: Progressing Goal: Respiratory complications will improve Outcome: Progressing Goal: Cardiovascular complication will be avoided Outcome: Progressing   Problem: Activity: Goal: Risk for activity intolerance will decrease Outcome: Progressing   Problem: Nutrition: Goal: Adequate nutrition will be maintained Outcome: Progressing   Problem: Coping: Goal: Level of anxiety will decrease Outcome: Progressing   Problem: Elimination: Goal: Will not experience complications related to bowel motility Outcome: Progressing Goal: Will not experience complications related to urinary retention Outcome: Progressing   Problem: Pain Managment: Goal: General experience of comfort will improve and/or be controlled Outcome: Progressing   Problem: Safety: Goal: Ability to remain free from injury will improve Outcome: Progressing   Problem: Skin Integrity: Goal: Risk for impaired skin integrity will decrease Outcome: Progressing   Problem:  Education: Goal: Ability to describe self-care measures that may prevent or decrease complications (Diabetes Survival Skills Education) will improve Outcome: Progressing Goal: Individualized Educational Video(s) Outcome: Progressing   Problem: Coping: Goal: Ability to adjust to condition or change in health will improve Outcome: Progressing  Problem: Health Behavior/Discharge Planning: Goal: Ability to identify and utilize available resources and services will improve Outcome: Progressing Goal: Ability to manage health-related needs will improve Outcome: Progressing  Problem: Metabolic: Goal: Ability to maintain appropriate glucose levels will improve Outcome: Progressing   Problem: Nutritional: Goal: Maintenance of adequate nutrition will improve Outcome: Progressing Goal: Progress toward achieving an optimal weight will improve Outcome: Progressing   Problem: Skin Integrity: Goal: Risk for impaired skin integrity will decrease Outcome: Progressing   Problem: Tissue Perfusion: Goal: Adequacy of tissue perfusion will improve Outcome: Progressing

## 2023-09-10 NOTE — Progress Notes (Signed)
 Occupational Therapy Treatment Patient Details Name: Devin White MRN: [id number] DOB: 1955-09-29 Today's Date: 09/10/2023   History of present illness 68 y/o male presented to ED on 09/06/23 for presyncope and rectal pain. Admitted for sepsis due to undetermined organism. PMH: liver cirrhosis s/p liver transplant, CHF, CKD stage IIIa, HTN   OT comments  Pt. presents with 5/10 pain at rest,  which reports increases with movement attempts. Pt. Underwent debridement on 3/25 for necrotizing soft tissue perineal infection. Pt. requires maxA LE ADLs. Pt. education was provided about AE use for LE ADLs. Reviewed Pt. home set-up, daily routines, and DME.  Pt. Continues to benefit from OT services for ADL training, A/E training, UE there. Ex. and Pt./caregiver education about home modification, and DME. D/C recommendations remain appropriate.        If plan is discharge home, recommend the following:  A lot of help with walking and/or transfers;A lot of help with bathing/dressing/bathroom;Assistance with cooking/housework;Assist for transportation;Help with stairs or ramp for entrance   Equipment Recommendations       Recommendations for Other Services      Precautions / Restrictions Precautions Precautions: Fall Restrictions Weight Bearing Restrictions Per Provider Order: No       Mobility Bed Mobility                    Transfers                   General transfer comment: deferred 2/2 pain     Balance                                           ADL either performed or assessed with clinical judgement   ADL Overall ADL's : Needs assistance/impaired                     Lower Body Dressing: Maximal assistance;Set up                      Extremity/Trunk Assessment              Vision       Perception     Praxis     Communication Communication Communication: No apparent difficulties;Other (comment)   Cognition  Arousal: Alert Behavior During Therapy: WFL for tasks assessed/performed Cognition: No apparent impairments                               Following commands: Intact        Cueing      Exercises      Shoulder Instructions       General Comments      Pertinent Vitals/ Pain       Pain Assessment Pain Assessment: 0-10 Pain Score: 5  Pain Location: groin and rectum Pain Descriptors / Indicators: Discomfort Pain Intervention(s): Limited activity within patient's tolerance, Monitored during session, Repositioned  Home Living                                          Prior Functioning/Environment              Frequency  Min 1X/week  Progress Toward Goals  OT Goals(current goals can now be found in the care plan section)  Progress towards OT goals: Not progressing toward goals - comment (Pain 2/2 surgery debridement 3/25)  Acute Rehab OT Goals Patient Stated Goal: To go to rehab prior to returning home OT Goal Formulation: With patient Time For Goal Achievement: 09/23/23 Potential to Achieve Goals: Good  Plan      Co-evaluation                 AM-PAC OT "6 Clicks" Daily Activity     Outcome Measure   Help from another person eating meals?: None Help from another person taking care of personal grooming?: None Help from another person toileting, which includes using toliet, bedpan, or urinal?: A Lot Help from another person bathing (including washing, rinsing, drying)?: A Little Help from another person to put on and taking off regular upper body clothing?: A Little Help from another person to put on and taking off regular lower body clothing?: A Lot 6 Click Score: 18    End of Session    OT Visit Diagnosis: Other abnormalities of gait and mobility (R26.89);Muscle weakness (generalized) (M62.81);Pain   Activity Tolerance Patient limited by pain   Patient Left in bed;with call bell/phone within reach;with bed  alarm set   Nurse Communication          Time: 1610-9604 OT Time Calculation (min): 27 min  Charges: OT General Charges $OT Visit: 1 Visit OT Treatments $Self Care/Home Management : 23-37 mins  Olegario Messier, MS, OTR/L   Olegario Messier 09/10/2023, 3:59 PM

## 2023-09-10 NOTE — Progress Notes (Signed)
 Pharmacy Antibiotic Note  Devin White is a 68 y.o. male admitted on 09/06/2023 with  Necrotizing soft tissue infection perineum .  Pharmacy has been consulted for zosyn and vancomycin dosing.  -AKI on CKD-on Bactrim, furosemide and ACE inhibitor's PTA.   -s/p liver transplant 2009-on mycophenolate, tacrolimus and prednisone PTA.  -f/u I&D cx 3/25  Plan: Continue Zosyn 3.375g IV q8h (4 hour infusion).  Given worsening renal function, decrease vancomycin to 1250 mg IV Q 24 hrs. Goal AUC 400-550. Expected AUC: 440.1 SCr used: 2.44 Cmin 12.7 Vd 0.72 for BMI </= 35  Follow up renal function, cultures, LOT, etc   Height: 6\' 1"  (185.4 cm) Weight: 120.6 kg (265 lb 14 oz) IBW/kg (Calculated) : 79.9  Temp (24hrs), Avg:98.2 F (36.8 C), Min:97.9 F (36.6 C), Max:99 F (37.2 C)  Recent Labs  Lab 09/06/23 1428 09/06/23 1553 09/06/23 2123 09/07/23 0502 09/08/23 0622 09/09/23 0720 09/10/23 0413  WBC 26.8*  --   --  17.1* 12.3* 9.9  --   CREATININE 2.67*  --   --  2.28* 2.29* 2.11* 2.44*  LATICACIDVEN  --  1.8 1.2  --   --   --   --     Estimated Creatinine Clearance: 41.1 mL/min (A) (by C-G formula based on SCr of 2.44 mg/dL (H)).    Allergies  Allergen Reactions   Levaquin [Levofloxacin] Shortness Of Breath    SOB and severe leg pain/weakness    Antimicrobials this admission: - 3/23 Ceftriaxone + 3/22 metronidazole>> 3/25 Zosyn  3/25 >>   vancomycin 3/25 >>    Dose adjustments this admission:    Microbiology results: 3/22 BCx: NGTD   UCx:      Sputum:      MRSA PCR:   3/25 abscess cxs: few GPC, rare GNR  Thank you for allowing pharmacy to be a part of this patient's care.  Paulita Fujita, PharmD Clinical Pharmacist 09/10/2023

## 2023-09-10 NOTE — Progress Notes (Signed)
 Pt hadn't voided since 1230 on 09/09/2023, with the assistance of being straight cath'd.  At around 0100 on 09/10/2023, the pt still hadn't voided, so this RN bladder scanned the pt.  Bladder scanned revealed greater than in pt's bladder.  This RN gave the pt the opportunity to stand at the bedside and try to use the urinal.  The pt stated that he could feel the urge to void, but he could not get it out.  At that the point, this RN straight cath'd the pt, and was able to evacuate of yellow/straw clear urine from pt's bladder.  On-Call provider notified.

## 2023-09-10 NOTE — Progress Notes (Signed)
 [facility] SURGICAL PROGRESS NOTE  Hospital Day(s): 4.   Post op day(s): 1 Day Post-Op.   Interval History:  Patient seen and examined No acute events or new complaints overnight.  Patient reports pain is improved but this area is still quite sore No fever, chills  Slight bump in leukocytosis this AM; 11.6K - likely reactive  Hgb to 11.4; stable  Slight bump in sCr post-operatively; 2.36; UO - 1775 ccs Cx with GPC, GNR He is on Vancomycin and Zosyn  Vital signs in last 24 hours: [min-max] current  Temp:  [97.9 F (36.6 C)-99 F (37.2 C)] 98.1 F (36.7 C) (03/26 0407) Pulse Rate:  [74-103] 74 (03/26 0407) Resp:  [15-24] 16 (03/26 0407) BP: (86-155)/(60-86) 116/70 (03/26 0407) SpO2:  [88 %-93 %] 93 % (03/26 0407)     Height: 6\' 1"  (185.4 cm) Weight: 120.6 kg BMI (Calculated): 35.09   Intake/Output last 2 shifts:  03/25 0701 - 03/26 0700 In: 1860 [P.O.:360; I.V.:600; IV Piggyback:900] Out: 1780 [Urine:1775; Blood:5]   Physical Exam:  Constitutional: alert, cooperative and no distress  Respiratory: breathing non-labored at rest  Cardiovascular: regular rate and sinus rhythm  Integumentary: 10 x  x 3 cm wound to the right perineum, tissue appears healthy without gross necrosis, punctate bleeding  Labs:     Latest Ref Rng & Units 09/09/2023    7:20 AM 09/08/2023    6:22 AM 09/07/2023    5:02 AM  CBC  WBC 4.0 - 10.5 K/uL 9.9  12.3  17.1   Hemoglobin 13.0 - 17.0 g/dL 16.1  09.6  04.5   Hematocrit 39.0 - 52.0 % 35.4  34.6  38.0   Platelets 150 - 400 K/uL 120  107  111       Latest Ref Rng & Units 09/10/2023    4:13 AM 09/09/2023    7:20 AM 09/08/2023    6:22 AM  CMP  Glucose 70 - 99 mg/dL  409  97   BUN 8 - 23 mg/dL  21  27   Creatinine 8.11 - 1.24 mg/dL 9.14  7.82  9.56   Sodium 135 - 145 mmol/L  140  139   Potassium 3.5 - 5.1 mmol/L  3.9  4.0   Chloride 98 - 111 mmol/L  108  108   CO2 22 - 32 mmol/L  25  22   Calcium 8.9 - 10.3 mg/dL  8.5  8.5      Imaging studies: No new pertinent imaging studies   Assessment/Plan:  68 y.o. male 1 Day Post-Op s/p incision and drainage of complex perineal abscess with wide debridement of skin subcutaneous tissue and fascia, complicated by pertinent comorbidities including liver transplant on immunosuppression, DM.   - Wound Care: Will do wet-to-dry dressing BID with Dakins, cover with dry gauze/ABD, secure with mesh underwear  - Continue Abx (Zosyn, Vancomycin); follow up Cx  - Pain control prn  - Hyperglycemic control   - Okay to mobilize as tolerated  - Further management per primary service; we will follow   All of the above findings and recommendations were discussed with the patient, and the medical team, and all of patient's questions were answered to his expressed satisfaction.  -- Lynden Oxford, PA-C [facility] 09/10/2023, 7:12 AM M-F: 7am - 4pm

## 2023-09-10 NOTE — Hospital Course (Signed)
 67yo with h/o cirrhosis s/p liver transplant (2009) on immunosuppression, chronic HFpEF, GERD, BPH, HTN, CVA, stage 3a CKD, and gout who presented on 3/22 with presyncope.  He was determined to have sepsis due to perirectal abscess and necrotizing soft tissue infection of the perineum and underwent I&D and wide debridement on 3/25.

## 2023-09-10 NOTE — Plan of Care (Signed)

## 2023-09-10 NOTE — Progress Notes (Signed)
 [facility]  ROUNDING NOTE   Subjective:   Patient seen sitting at bedside Alert and oriented No family present Reports soreness at procedure site  Creatinine 2.36 Urine output in past 24 hours  Objective:  Vital signs in last 24 hours:  Temp:  [97.9 F (36.6 C)-99 F (37.2 C)] 97.9 F (36.6 C) (03/26 0734) Pulse Rate:  [74-103] 80 (03/26 0734) Resp:  [15-24] 18 (03/26 0734) BP: (86-123)/(60-75) 123/75 (03/26 0734) SpO2:  [88 %-93 %] 93 % (03/26 0734)  Weight change:  Filed Weights   09/06/23 1425 09/06/23 2014  Weight: 117.9 kg 120.6 kg    Intake/Output: I/O last 3 completed shifts: In: 3325.9 [P.O.:600; I.V.:1625.9; IV Piggyback:1100] Out: 3555 [Urine:3550; Blood:5]   Intake/Output this shift:  Total I/O In: 240 [P.O.:240] Out: -   Physical Exam: General: NAD  Head: Normocephalic, atraumatic. Moist oral mucosal membranes  Eyes: Anicteric  Lungs:  Clear to auscultation, normal effort  Heart: Regular rate and rhythm  Abdomen:  Soft, nontender,   Extremities:  No peripheral edema.  Neurologic: Alert and oriented, moving all four extremities  Skin: No lesions   Basic Metabolic Panel: Recent Labs  Lab 09/06/23 1428 09/07/23 0502 09/08/23 0622 09/09/23 0720 09/10/23 0413 09/10/23 0840  NA 134* 139 139 140  --  137  K 4.8 4.5 4.0 3.9  --  4.4  CL 109 109 108 108  --  104  CO2 20* 24 22 25   --  24  GLUCOSE 172* 89 97 105*  --  157*  BUN 37* 35* 27* 21  --  21  CREATININE 2.67* 2.28* 2.29* 2.11* 2.44* 2.36*  CALCIUM 9.0 8.5* 8.5* 8.5*  --  8.1*    Liver Function Tests: Recent Labs  Lab 09/06/23 1428 09/10/23 0840  AST 23 17  ALT 22 21  ALKPHOS 37* 50  BILITOT 2.7* 1.2  PROT 6.8 6.1*  ALBUMIN 4.0 2.7*   Recent Labs  Lab 09/06/23 1428  LIPASE 28   No results for input(s): "AMMONIA" in the last 168 hours.  CBC: Recent Labs  Lab 09/06/23 1428 09/07/23 0502 09/08/23 0622 09/09/23 0720 09/10/23 0840  WBC 26.8*  17.1* 12.3* 9.9 11.6*  NEUTROABS  --   --   --   --  8.8*  HGB 15.2 12.6* 12.0* 11.8* 11.4*  HCT 45.2 38.0* 34.6* 35.4* 34.0*  MCV 94.8 95.2 93.5 93.7 93.9  PLT 183 111* 107* 120* 147*    Cardiac Enzymes: No results for input(s): "CKTOTAL", "CKMB", "CKMBINDEX", "TROPONINI" in the last 168 hours.  BNP: Invalid input(s): "POCBNP"  CBG: Recent Labs  Lab 09/09/23 1604 09/09/23 1747 09/09/23 2122 09/10/23 0736 09/10/23 1126  GLUCAP 183* 188* 221* 114* 176*    Microbiology: Results for orders placed or performed during the hospital encounter of 09/06/23  Blood Culture (routine x 2)     Status: None (Preliminary result)   Collection Time: 09/06/23  3:53 PM   Specimen: BLOOD RIGHT ARM  Result Value Ref Range Status   Specimen Description BLOOD RIGHT ARM  Final   Special Requests   Final    BOTTLES DRAWN AEROBIC AND ANAEROBIC Blood Culture results may not be optimal due to an inadequate volume of blood received in culture bottles   Culture   Final    NO GROWTH 4 DAYS Performed at [facility], [address] [place], [place] [address]    Report Status PENDING  Incomplete  Blood Culture (routine x 2)  Status: None (Preliminary result)   Collection Time: 09/06/23  3:53 PM   Specimen: BLOOD  Result Value Ref Range Status   Specimen Description BLOOD LEFT ANTECUBITAL  Final   Special Requests   Final    BOTTLES DRAWN AEROBIC AND ANAEROBIC Blood Culture adequate volume   Culture   Final    NO GROWTH 4 DAYS Performed at [facility], [address] [place], [place] [address]    Report Status PENDING  Incomplete  Resp panel by RT-PCR (RSV, Flu A&B, Covid) Anterior Nasal Swab     Status: None   Collection Time: 09/06/23  5:01 PM   Specimen: Anterior Nasal Swab  Result Value Ref Range Status   SARS Coronavirus 2 by RT PCR NEGATIVE NEGATIVE Final    Comment: (NOTE) SARS-CoV-2 target nucleic acids are NOT DETECTED.  The SARS-CoV-2 RNA is generally  detectable in upper respiratory specimens during the acute phase of infection. The lowest concentration of SARS-CoV-2 viral copies this assay can detect is 138 copies/mL. A negative result does not preclude SARS-Cov-2 infection and should not be used as the sole basis for treatment or other patient management decisions. A negative result may occur with  improper specimen collection/handling, submission of specimen other than nasopharyngeal swab, presence of viral mutation(s) within the areas targeted by this assay, and inadequate number of viral copies(<138 copies/mL). A negative result must be combined with clinical observations, patient history, and epidemiological information. The expected result is Negative.  Fact Sheet for Patients:  BloggerCourse.com  Fact Sheet for Healthcare Providers:  SeriousBroker.it  This test is no t yet approved or cleared by the [place] FDA and  has been authorized for detection and/or diagnosis of SARS-CoV-2 by FDA under an Emergency Use Authorization (EUA). This EUA will remain  in effect (meaning this test can be used) for the duration of the COVID-19 declaration under Section 564(b)(1) of the Act, 21 U.S.C.section 360bbb-3(b)(1), unless the authorization is terminated  or revoked sooner.       Influenza A by PCR NEGATIVE NEGATIVE Final   Influenza B by PCR NEGATIVE NEGATIVE Final    Comment: (NOTE) The Xpert Xpress SARS-CoV-2/FLU/RSV plus assay is intended as an aid in the diagnosis of influenza from Nasopharyngeal swab specimens and should not be used as a sole basis for treatment. Nasal washings and aspirates are unacceptable for Xpert Xpress SARS-CoV-2/FLU/RSV testing.  Fact Sheet for Patients: BloggerCourse.com  Fact Sheet for Healthcare Providers: SeriousBroker.it  This test is not yet approved or cleared by the [place] FDA  and has been authorized for detection and/or diagnosis of SARS-CoV-2 by FDA under an Emergency Use Authorization (EUA). This EUA will remain in effect (meaning this test can be used) for the duration of the COVID-19 declaration under Section 564(b)(1) of the Act, 21 U.S.C. section 360bbb-3(b)(1), unless the authorization is terminated or revoked.     Resp Syncytial Virus by PCR NEGATIVE NEGATIVE Final    Comment: (NOTE) Fact Sheet for Patients: BloggerCourse.com  Fact Sheet for Healthcare Providers: SeriousBroker.it  This test is not yet approved or cleared by the [place] FDA and has been authorized for detection and/or diagnosis of SARS-CoV-2 by FDA under an Emergency Use Authorization (EUA). This EUA will remain in effect (meaning this test can be used) for the duration of the COVID-19 declaration under Section 564(b)(1) of the Act, 21 U.S.C. section 360bbb-3(b)(1), unless the authorization is terminated or revoked.  Performed at [facility], [address] [place],  [place] [address]   Aerobic/Anaerobic Culture w Gram Stain (surgical/deep wound)     Status: None (Preliminary result)   Collection Time: 09/09/23  3:19 PM   Specimen: Abscess  Result Value Ref Range Status   Specimen Description   Final    ABSCESS Performed at [facility], [address] [place], [place] [address]    Special Requests PERINEAL INFECTION 1 SPEC A  Final   Gram Stain   Final    RARE WBC PRESENT, PREDOMINANTLY PMN FEW GRAM POSITIVE COCCI RARE GRAM NEGATIVE RODS Performed at [facility], [address] [place], [place] [address]    Culture PENDING  Incomplete   Report Status PENDING  Incomplete  Aerobic/Anaerobic Culture w Gram Stain (surgical/deep wound)     Status: None (Preliminary result)   Collection Time: 09/09/23  3:27 PM   Specimen: Abscess  Result Value Ref Range Status   Specimen Description    Final    ABSCESS Performed at [facility], [address], [place], [place] [address]    Special Requests PERINEAL INFECTION 2 SPEC B  Final   Gram Stain   Final    RARE WBC PRESENT, PREDOMINANTLY PMN FEW GRAM POSITIVE COCCI RARE GRAM NEGATIVE RODS Performed at [facility], [address] [place], [place] [address]    Culture PENDING  Incomplete   Report Status PENDING  Incomplete  Aerobic/Anaerobic Culture w Gram Stain (surgical/deep wound)     Status: None (Preliminary result)   Collection Time: 09/09/23  3:27 PM   Specimen: Abscess  Result Value Ref Range Status   Specimen Description   Final    ABSCESS Performed at [facility], [address], [place], [place] [address]    Special Requests PERINEAL INFECTION 3 SPEC C  Final   Gram Stain   Final    RARE WBC PRESENT, PREDOMINANTLY PMN RARE GRAM POSITIVE COCCI Performed at [facility], [address] [place], [place] [address]    Culture PENDING  Incomplete   Report Status PENDING  Incomplete    Coagulation Studies: No results for input(s): "LABPROT", "INR" in the last 72 hours.   Urinalysis: Recent Labs    09/08/23 1335  COLORURINE YELLOW*  LABSPEC 1.012  PHURINE 5.0  GLUCOSEU NEGATIVE  HGBUR NEGATIVE  BILIRUBINUR NEGATIVE  KETONESUR NEGATIVE  PROTEINUR NEGATIVE  NITRITE NEGATIVE  LEUKOCYTESUR NEGATIVE      Imaging: CT PELVIS WO CONTRAST Result Date: 09/09/2023 CLINICAL DATA:  Perineal and scrotal pain, erythema, concerning for infection EXAM: CT PELVIS WITHOUT CONTRAST TECHNIQUE: Multidetector CT imaging of the pelvis was performed following the standard protocol without intravenous contrast. RADIATION DOSE REDUCTION: This exam was performed according to the departmental dose-optimization program which includes automated exposure control, adjustment of the mA and/or kV according to patient size and/or use of iterative reconstruction technique. COMPARISON:  09/06/2023  FINDINGS: Urinary Tract: Visualized distal ureters decompressed. Urinary bladder physiologically distended. Bowel: Visualized small bowel and colon are nondilated. Scattered distal descending and proximal sigmoid diverticula without adjacent inflammatory change. Vascular/Lymphatic: Mild scattered aortoiliac calcified plaque without aneurysm. No pelvic adenopathy. Reproductive: Prostate unremarkable. Seminal vesicles mildly distended as before. Other: Right medial gluteal scattered bubbles of subcutaneous gas with regional inflammatory/edematous changes. No discrete abscess. No suggestion of extension to the scrotum, which is incompletely visualized. Musculoskeletal: Posterior lumbar fixation hardware L4-S1. IMPRESSION: 1. Right perianal/perineal gas consistent with infection, without drainable abscess. Electronically Signed   By: Ronald Pippins.D.  On: 09/09/2023 13:32      Medications:    piperacillin-tazobactam (ZOSYN)  IV 3.375 g (09/10/23 0556)   vancomycin      amLODipine  10 mg Oral Daily   aspirin EC  81 mg Oral Daily   enoxaparin (LOVENOX) injection  0.5 mg/kg Subcutaneous Q24H   feeding supplement  1 Container Oral TID BM   gabapentin  300 mg Oral TID   insulin aspart  0-15 Units Subcutaneous TID WC   insulin aspart  0-5 Units Subcutaneous QHS   methocarbamol  500 mg Oral BID   nortriptyline  10 mg Oral BID   pantoprazole  40 mg Oral Daily   predniSONE  10 mg Oral Q breakfast   rosuvastatin  20 mg Oral Daily   senna-docusate  1 tablet Oral BID   sodium hypochlorite   Irrigation Daily   tacrolimus  0.5 mg Oral q morning   tacrolimus  1 mg Oral QHS   tamsulosin  0.8 mg Oral Daily   trimethoprim  100 mg Oral Daily   acetaminophen **OR** acetaminophen, calcium carbonate, HYDROmorphone (DILAUDID) injection, magnesium hydroxide, menthol-cetylpyridinium, ondansetron **OR** ondansetron (ZOFRAN) IV, oxyCODONE, traZODone  Assessment/ Plan:  [name] is a 68 y.o.  male with a  PMHx of hypertension, coronary artery disease, congestive heart failure, diabetes, BPH, history of CVA, history of chronic kidney disease stage IIIa and a history of liver cirrhosis s/p transplant in 2009.  Patient now being admitted with history of diarrhea, abdominal pain and syncope/presyncopal episodes.  Patient required CT scan of the abdomen and pelvis with IV contrast to rule out any acute abdominal process. He has been on mycophenolate, tacrolimus and prednisone. He is also on Bactrim, furosemide and ACE inhibitor's.  Acute kidney injury on chronic kidney disease: Patient has baseline creatinine 1.6. Now the creatinine has gone up to 2.6. This may be secondary to prerenal azotemia. Patient also received IV contrast.  Creatinine slightly elevated today.  Appears patient became hypotensive after procedure.  Creatinine recheck appears recovery in progress.  Will continue to monitor.  No acute indication for dialysis.  Lab Results  Component Value Date   CREATININE 2.36 (H) 09/10/2023   CREATININE 2.44 (H) 09/10/2023   CREATININE 2.11 (H) 09/09/2023    Intake/Output Summary (Last 24 hours) at 09/10/2023 1242 Last data filed at 09/10/2023 1035 Gross per 24 hour  Intake 1980 ml  Output 1205 ml  Net 775 ml    #2: Sepsis/Perirectal abscess and diarrhea: Continue present medications including Rocephin,  and vancomycin.   General surgery performed I&D yesterday.  Wound packed with gauze.   #3: History of liver transplant: Can continue tacrolimus and prednisone.  Will hold the mycophenolate until diarrhea resolves.   #4: Hypertension: Remains on amlodipine.  Lisinopril held   Blood pressure has recovered since procedure, 123/75.    LOS: 4 Ayde Record 3/26/202512:42 PM

## 2023-09-11 DIAGNOSIS — A419 Sepsis, unspecified organism: Secondary | ICD-10-CM | POA: Diagnosis not present

## 2023-09-11 LAB — BASIC METABOLIC PANEL WITH GFR
Anion gap: 14 (ref 5–15)
BUN: 21 mg/dL (ref 8–23)
CO2: 23 mmol/L (ref 22–32)
Calcium: 8.6 mg/dL — ABNORMAL LOW (ref 8.9–10.3)
Chloride: 103 mmol/L (ref 98–111)
Creatinine, Ser: 2.21 mg/dL — ABNORMAL HIGH (ref 0.61–1.24)
GFR, Estimated: 32 mL/min — ABNORMAL LOW (ref >60)
Glucose, Bld: 133 mg/dL — ABNORMAL HIGH (ref 70–99)
Potassium: 4.1 mmol/L (ref 3.5–5.1)
Sodium: 140 mmol/L (ref 135–145)

## 2023-09-11 LAB — CULTURE, BLOOD (ROUTINE X 2)
Culture: NO GROWTH
Culture: NO GROWTH
Special Requests: ADEQUATE

## 2023-09-11 LAB — CBC WITH DIFFERENTIAL/PLATELET
Abs Immature Granulocytes: 0.24 10*3/uL — ABNORMAL HIGH (ref 0.00–0.07)
Basophils Absolute: 0.1 10*3/uL (ref 0.0–0.1)
Basophils Relative: 1 %
Eosinophils Absolute: 0.3 10*3/uL (ref 0.0–0.5)
Eosinophils Relative: 3 %
HCT: 36.6 % — ABNORMAL LOW (ref 39.0–52.0)
Hemoglobin: 12.2 g/dL — ABNORMAL LOW (ref 13.0–17.0)
Immature Granulocytes: 2 %
Lymphocytes Relative: 13 %
Lymphs Abs: 1.3 10*3/uL (ref 0.7–4.0)
MCH: 31.9 pg (ref 26.0–34.0)
MCHC: 33.3 g/dL (ref 30.0–36.0)
MCV: 95.8 fL (ref 80.0–100.0)
Monocytes Absolute: 0.8 10*3/uL (ref 0.1–1.0)
Monocytes Relative: 8 %
Neutro Abs: 7.4 10*3/uL (ref 1.7–7.7)
Neutrophils Relative %: 73 %
Platelets: 163 10*3/uL (ref 150–400)
RBC: 3.82 MIL/uL — ABNORMAL LOW (ref 4.22–5.81)
RDW: 14.3 % (ref 11.5–15.5)
WBC: 10.1 10*3/uL (ref 4.0–10.5)
nRBC: 0 % (ref 0.0–0.2)

## 2023-09-11 LAB — SURGICAL PATHOLOGY

## 2023-09-11 LAB — GLUCOSE, CAPILLARY
Glucose-Capillary: 154 mg/dL — ABNORMAL HIGH (ref 70–99)
Glucose-Capillary: 147 mg/dL — ABNORMAL HIGH (ref 70–99)
Glucose-Capillary: 164 mg/dL — ABNORMAL HIGH (ref 70–99)
Glucose-Capillary: 169 mg/dL — ABNORMAL HIGH (ref 70–99)

## 2023-09-11 MED ORDER — DAKINS (1/4 STRENGTH) 0.125 % EX SOLN
Freq: Two times a day (BID) | CUTANEOUS | Status: AC
  Filled 2023-09-11: qty 1

## 2023-09-11 MED ORDER — VANCOMYCIN HCL 1500 MG/300ML IV SOLN
1500.0000 mg | INTRAVENOUS | Status: DC
  Administered 2023-09-11 – 2023-09-13 (×3): 1500 mg via INTRAVENOUS
  Filled 2023-09-11 (×4): qty 300

## 2023-09-11 NOTE — Progress Notes (Signed)
 Pharmacy Antibiotic Note  Devin White is a 68 y.o. male admitted on 09/06/2023 with  Necrotizing soft tissue infection perineum. Pharmacy has been consulted for zosyn and vancomycin dosing.  -AKI on CKD-on Bactrim, furosemide and ACE inhibitor's PTA.   -s/p liver transplant 2009-on mycophenolate, tacrolimus and prednisone PTA.  -f/u I&D cx 3/25  Plan: Day 6 of antibiotics, Day 3 of vancomycin and Zosyn  Increase vancomycin 1500 mg IV Q24H with improving renal function. Goal AUC 400-550. Expected AUC: 484.5 Expected Css min: 13.5 SCr used: 2.21  Weight used: IBW, Vd used: 0.72 (BMI 35) Patient is also on Zosyn 3.375 g IV Q8H Continue to monitor renal function as AKI improves (baseline around 1.7) and follow culture results (results still pending)  Height: 6\' 1"  (185.4 cm) Weight: 120.6 kg (265 lb 14 oz) IBW/kg (Calculated) : 79.9  Temp (24hrs), Avg:97.8 F (36.6 C), Min:97.4 F (36.3 C), Max:98 F (36.7 C)  Recent Labs  Lab 09/06/23 1553 09/06/23 2123 09/07/23 0502 09/08/23 0622 09/09/23 0720 09/10/23 0413 09/10/23 0840 09/11/23 0423  WBC  --   --  17.1* 12.3* 9.9  --  11.6* 10.1  CREATININE  --   --  2.28* 2.29* 2.11* 2.44* 2.36* 2.21*  LATICACIDVEN 1.8 1.2  --   --   --   --   --   --     Estimated Creatinine Clearance: 45.3 mL/min (A) (by C-G formula based on SCr of 2.21 mg/dL (H)).    Allergies  Allergen Reactions   Levaquin [Levofloxacin] Shortness Of Breath    SOB and severe leg pain/weakness    Antimicrobials this admission: - 3/23 Ceftriaxone + 3/22 metronidazole>> 3/25 Zosyn 3/25 >>   vancomycin 3/25 >>    Dose adjustments this admission: 3/27 Vancomycin from 1250 mg to 1500 mg due to improving renal function  Microbiology results: 3/22 BCx: NGTD   3/25 abscess cxs: few GPC, rare GNR  Thank you for allowing pharmacy to be a part of this patient's care.  Effie Shy, PharmD Pharmacy Resident  09/11/2023 8:35 AM

## 2023-09-11 NOTE — Progress Notes (Signed)
 [facility] SURGICAL PROGRESS NOTE  Hospital Day(s): 5.   Post op day(s): 2 Days Post-Op.   Interval History:  Patient seen and examined No acute events or new complaints overnight.  Patient reports he is doing well Perineum remains tender expectedly; worse when he sits No fever, chills  WBC normalized; 10.1K Hgb to 12.2; stable  sCr improving - 2.21; UO - 1775 ccs Cx with GPC, GNR He is on Vancomycin and Zosyn  Vital signs in last 24 hours: [min-max] current  Temp:  [97.4 F (36.3 C)-98 F (36.7 C)] 98 F (36.7 C) (03/27 0350) Pulse Rate:  [80-87] 84 (03/27 0350) Resp:  [11-18] 11 (03/27 0350) BP: (123-141)/(70-82) 140/79 (03/27 0350) SpO2:  [91 %-94 %] 94 % (03/27 0350)     Height: 6\' 1"  (185.4 cm) Weight: 120.6 kg BMI (Calculated): 35.09   Intake/Output last 2 shifts:  03/26 0701 - 03/27 0700 In: 1126.9 [P.O.:720; IV Piggyback:406.9] Out: 2700 [Urine:2700]   Physical Exam:  Constitutional: alert, cooperative and no distress  Respiratory: breathing non-labored at rest  Cardiovascular: regular rate and sinus rhythm  Integumentary: 10 x  x 3 cm wound to the right perineum, tissue appears healthy without gross necrosis, punctate bleeding  Perineum (09/11/2023):    Labs:     Latest Ref Rng & Units 09/11/2023    4:23 AM 09/10/2023    8:40 AM 09/09/2023    7:20 AM  CBC  WBC 4.0 - 10.5 K/uL 10.1  11.6  9.9   Hemoglobin 13.0 - 17.0 g/dL 16.1  09.6  04.5   Hematocrit 39.0 - 52.0 % 36.6  34.0  35.4   Platelets 150 - 400 K/uL 163  147  120       Latest Ref Rng & Units 09/11/2023    4:23 AM 09/10/2023    8:40 AM 09/10/2023    4:13 AM  CMP  Glucose 70 - 99 mg/dL 409  811    BUN 8 - 23 mg/dL 21  21    Creatinine 9.14 - 1.24 mg/dL 7.82  9.56  2.13   Sodium 135 - 145 mmol/L 140  137    Potassium 3.5 - 5.1 mmol/L 4.1  4.4    Chloride 98 - 111 mmol/L 103  104    CO2 22 - 32 mmol/L 23  24    Calcium 8.9 - 10.3 mg/dL 8.6  8.1    Total Protein 6.5 - 8.1  g/dL  6.1    Total Bilirubin 0.0 - 1.2 mg/dL  1.2    Alkaline Phos 38 - 126 U/L  50    AST 15 - 41 U/L  17    ALT 0 - 44 U/L  21      Imaging studies: No new pertinent imaging studies   Assessment/Plan:  67 y.o. male 2 Days Post-Op s/p incision and drainage of complex perineal abscess with wide debridement of skin subcutaneous tissue and fascia, complicated by pertinent comorbidities including liver transplant on immunosuppression, DM.   - Wound Care: Will do wet-to-dry dressing BID with Dakins, cover with dry gauze/ABD, secure with mesh underwear; will continue for more day to complete 3 total.   - Continue Abx (Zosyn, Vancomycin); follow up Cx  - Pain control prn  - Hyperglycemic control   - Okay to mobilize as tolerated  - Further management per primary service; we will follow    - Discharge Planning: Once we have Cx data to tailor Abx we can move towards discharge.  Will need wet-to-dry dressing changes at home daily.   All of the above findings and recommendations were discussed with the patient, and the medical team, and all of patient's questions were answered to his expressed satisfaction.  -- Lynden Oxford, PA-C [facility] 09/11/2023, 7:15 AM M-F: 7am - 4pm

## 2023-09-11 NOTE — Progress Notes (Signed)
 Physical Therapy Treatment Patient Details Name: Devin White MRN: [id number] DOB: 29-Jul-1955 Today's Date: 09/11/2023   History of Present Illness 68 y/o male presented to ED on 09/06/23 for presyncope and rectal pain. Admitted for sepsis due to undetermined organism. PMH: liver cirrhosis s/p liver transplant, CHF, CKD stage IIIa, HTN.    PT Comments  Pt resting in bed upon PT arrival; pt agreeable to therapy.  Pt reporting 6-7/10 wound/rectal pain at rest beginning of session and 8/10 end of session at rest (nurse present and notified of pt's pain).  During session pt modified independent with bed mobility; only able to briefly sit on EOB d/t rectal pain (pt leaning to L in sitting); CGA with transfer from bed using RW; and CGA to ambulate 30 feet with RW use.  Will continue to focus on strengthening, balance, and progressive functional mobility during hospitalization.   If plan is discharge home, recommend the following: A little help with walking and/or transfers;A little help with bathing/dressing/bathroom;Assistance with cooking/housework;Assist for transportation;Help with stairs or ramp for entrance   Can travel by private vehicle        Equipment Recommendations  Rollator (4 wheels)    Recommendations for Other Services       Precautions / Restrictions Precautions Precautions: Fall Recall of Precautions/Restrictions: Intact Restrictions Weight Bearing Restrictions Per Provider Order: No     Mobility  Bed Mobility Overal bed mobility: Needs Assistance Bed Mobility: Rolling, Sidelying to Sit, Sit to Sidelying Rolling: Modified independent (Device/Increase time) Sidelying to sit: Modified independent (Device/Increase time), HOB elevated     Sit to sidelying: Modified independent (Device/Increase time) General bed mobility comments: increased effort to perform on own; HOB elevated    Transfers Overall transfer level: Needs assistance Equipment used: Rolling walker  (2 wheels) Transfers: Sit to/from Stand Sit to Stand: Contact guard assist           General transfer comment: increased effort to stand from bed    Ambulation/Gait Ambulation/Gait assistance: Contact guard assist Gait Distance (Feet): 30 Feet Assistive device: Rolling walker (2 wheels) Gait Pattern/deviations: Step-to pattern, Decreased stride length, Knee flexed in stance - left Gait velocity: decreased     General Gait Details: CGA for safety   Stairs             Wheelchair Mobility     Tilt Bed    Modified Rankin (Stroke Patients Only)       Balance Overall balance assessment: Needs assistance Sitting-balance support: No upper extremity supported, Feet supported Sitting balance-Leahy Scale: Fair Sitting balance - Comments: pt leaning towards L d/t rectal pain   Standing balance support: Bilateral upper extremity supported, Reliant on assistive device for balance Standing balance-Leahy Scale: Fair Standing balance comment: steady static standing with B UE support on RW                            Communication Communication Communication: No apparent difficulties  Cognition Arousal: Alert Behavior During Therapy: WFL for tasks assessed/performed   PT - Cognitive impairments: No apparent impairments                         Following commands: Intact      Cueing Cueing Techniques: Verbal cues  Exercises      General Comments        Pertinent Vitals/Pain Pain Assessment Pain Assessment: 0-10 Pain Score: 8  Pain Location: groin and  rectum Pain Descriptors / Indicators: Discomfort Pain Intervention(s): Limited activity within patient's tolerance, Monitored during session, Repositioned, Other (comment) (RN notified of pt's pain)    Home Living                          Prior Function            PT Goals (current goals can now be found in the care plan section) Acute Rehab PT Goals Patient Stated Goal: to  get stronger PT Goal Formulation: With patient Time For Goal Achievement: 09/22/23 Potential to Achieve Goals: Good Progress towards PT goals: Progressing toward goals    Frequency    Min 3X/week      PT Plan      Co-evaluation              AM-PAC PT "6 Clicks" Mobility   Outcome Measure  Help needed turning from your back to your side while in a flat bed without using bedrails?: None Help needed moving from lying on your back to sitting on the side of a flat bed without using bedrails?: None Help needed moving to and from a bed to a chair (including a wheelchair)?: A Little Help needed standing up from a chair using your arms (e.g., wheelchair or bedside chair)?: A Little Help needed to walk in hospital room?: A Little Help needed climbing 3-5 steps with a railing? : A Little 6 Click Score: 20    End of Session Equipment Utilized During Treatment: Gait belt Activity Tolerance: Patient limited by pain Patient left: in bed;with call bell/phone within reach;with bed alarm set;with nursing/sitter in room Nurse Communication: Mobility status;Precautions;Other (comment) (Pt's pain status) PT Visit Diagnosis: Unsteadiness on feet (R26.81);Muscle weakness (generalized) (M62.81);Other abnormalities of gait and mobility (R26.89)     Time: 1610-9604 PT Time Calculation (min) (ACUTE ONLY): 14 min  Charges:    $Therapeutic Activity: 8-22 mins PT General Charges $$ ACUTE PT VISIT: 1 Visit                     Hendricks Limes, PT 09/11/23, 5:15 PM

## 2023-09-11 NOTE — Plan of Care (Signed)

## 2023-09-11 NOTE — Progress Notes (Signed)
 Progress Note   Patient: Devin White VWU:[id number] DOB: 01/27/1956 DOA: 09/06/2023     5 DOS: the patient was seen and examined on 09/11/2023   Brief hospital course: 67yo with h/o cirrhosis s/p liver transplant (2009) on immunosuppression, chronic HFpEF, GERD, BPH, HTN, CVA, stage 3a CKD, and gout who presented on 3/22 with presyncope.  He was determined to have sepsis due to perirectal abscess and necrotizing soft tissue infection of the perineum and underwent I&D and wide debridement on 3/25.    Assessment and Plan:  Sepsis due to suspected Fornier's gangrene Patient has a history of C. difficile colitis, most recently approximately 2 years ago No recent exposure to antibiotics however the patient is status post liver transplant on chronic immunosuppression Concern for perirectal abscess General surgery consulted Gas noted on CT of the pelvis on 3/25 concerning for necrotizing infection Underwent urgent surgical exploration 3/25 Continue IV antibiotics (Zosyn, Vanc) Surgery still following, will reassess tomorrow for possible further debridement BID wet-to-dry dressing changes with Dakins   Near syncope Suspect intravascular volume depletion in the setting of sepsis   Anticipate improvement with clinical condition Engage therapy services   Acute kidney injury superimposed on stage 3a chronic kidney disease Baseline creatinine 1.6, GFR 45-50 Presenting creatinine 2.67, GFR 26 Slowly improving, now 2.21, 32 Avoid nonessential nephrotoxins. CellCept currently on hold - restart as soon as safe   Type 2 diabetes mellitus with peripheral neuropathy Last A1c was 10.6, very poor control Hold oral agents Hold long-acting insulin for now   Sliding scale coverage Carb modified diet Continue gabapentin   Essential hypertension Continue amlodipine Hold lisinopril   Dyslipidemia Continue statin   Gout No evidence of exacerbation   Status post liver transplant Chronic  immunosuppression Resume immunosuppressive therapy Cellcept temporarily on hold   Class 2 Obesity Body mass index is 35.08 kg/m.Marland Kitchen  Weight loss should be encouraged Outpatient PCP/bariatric medicine f/u encouraged        Consultants: Surgery Nephrology PT OT   Procedures: I&D of complex perineal abscess with wide debridement, 3/25   Antibiotics: Cefepime x 1 Cefotetan x 1 Ceftriaxone x 2  Zosyn 3/25- Vancomycin 3/25-  30 Day Unplanned Readmission Risk Score    Flowsheet Row ED to Hosp-Admission (Current) from 09/06/2023 in [facility] 1C MEDICAL TELEMETRY  30 Day Unplanned Readmission Risk Score (%) 25.82 Filed at 09/11/2023 0801       This score is the patient's risk of an unplanned readmission within 30 days of being discharged (0 -100%). The score is based on dignosis, age, lab data, medications, orders, and past utilization.   Low:  0-14.9   Medium: 15-21.9   High: 22-29.9   Extreme: 30 and above           Subjective: Still having significant rectal pain.  No other new concerns.  I spoke with his daughter; she is not sure where he would want to go.  She thinks he will need a Rollator.   Objective: Vitals:   09/11/23 0856 09/11/23 1144  BP: 130/79 121/77  Pulse: 85 82  Resp: 17 17  Temp: 98.5 F (36.9 C) 98.1 F (36.7 C)  SpO2: (!) 89% 91%    Intake/Output Summary (Last 24 hours) at 09/11/2023 1329 Last data filed at 09/11/2023 1107 Gross per 24 hour  Intake 1126.93 ml  Output 3350 ml  Net -2223.07 ml   Filed Weights   09/06/23 1425 09/06/23 2014  Weight: 117.9 kg 120.6 kg  Exam:  General:  Appears calm and comfortable and is in NAD Eyes:   EOMI, normal lids, iris ENT:  grossly normal hearing, lips & tongue, mmm Cardiovascular:  RRR, no m/r/g. No LE edema.  Respiratory:   CTA bilaterally with no wheezes/rales/rhonchi.  Normal respiratory effort. Abdomen:  soft, NT, ND Skin:  no rash or induration seen on limited  exam Musculoskeletal:  grossly normal tone BUE/BLE, good ROM, no bony abnormality Psychiatric:  grossly normal mood and affect, speech fluent and appropriate, AOx3 Neurologic:  CN 2-12 grossly intact, moves all extremities in coordinated fashion  Data Reviewed: I have reviewed the patient's lab results since admission.  Pertinent labs for today include:   Glucose 133 BUN 21/Creatinine 2.21/GFR 32 WBC 10.1 Hgb 12.2, stable Wound cultures pending     Family Communication: None present; I spoke with his daughter by telephone  Disposition: Status is: Inpatient Remains inpatient appropriate because: ongoing management     Time spent: 50 minutes  Unresulted Labs (From admission, onward)     Start     Ordered   09/12/23 0500  CBC with Differential/Platelet  Tomorrow morning,   R       Question:  Specimen collection method  Answer:  Lab=Lab collect   09/11/23 1329   09/12/23 0500  Basic metabolic panel with GFR  Tomorrow morning,   R       Question:  Specimen collection method  Answer:  Lab=Lab collect   09/11/23 1329             Author: Jonah Blue, MD 09/11/2023 1:29 PM  For on call review [email or web address].

## 2023-09-11 NOTE — Plan of Care (Signed)
 Patient has been medicated X2 prior to dressing change. Seen by surgeon this morning and dressing change done. Ordered twice daily. Continues on IV ABT. Education provided on healing process and time.  Problem: Fluid Volume: Goal: Hemodynamic stability will improve Outcome: Progressing   Problem: Clinical Measurements: Goal: Diagnostic test results will improve Outcome: Progressing Goal: Signs and symptoms of infection will decrease Outcome: Progressing   Problem: Respiratory: Goal: Ability to maintain adequate ventilation will improve Outcome: Progressing   Problem: Education: Goal: Knowledge of General Education information will improve Description: Including pain rating scale, medication(s)/side effects and non-pharmacologic comfort measures Outcome: Progressing   Problem: Health Behavior/Discharge Planning: Goal: Ability to manage health-related needs will improve Outcome: Progressing   Problem: Clinical Measurements: Goal: Ability to maintain clinical measurements within normal limits will improve Outcome: Progressing Goal: Will remain free from infection Outcome: Progressing Goal: Diagnostic test results will improve Outcome: Progressing Goal: Respiratory complications will improve Outcome: Progressing Goal: Cardiovascular complication will be avoided Outcome: Progressing   Problem: Activity: Goal: Risk for activity intolerance will decrease Outcome: Progressing   Problem: Nutrition: Goal: Adequate nutrition will be maintained Outcome: Progressing   Problem: Coping: Goal: Level of anxiety will decrease Outcome: Progressing   Problem: Elimination: Goal: Will not experience complications related to bowel motility Outcome: Progressing Goal: Will not experience complications related to urinary retention Outcome: Progressing   Problem: Pain Managment: Goal: General experience of comfort will improve and/or be controlled Outcome: Progressing   Problem:  Safety: Goal: Ability to remain free from injury will improve Outcome: Progressing   Problem: Skin Integrity: Goal: Risk for impaired skin integrity will decrease Outcome: Progressing   Problem: Coping: Goal: Ability to adjust to condition or change in health will improve Outcome: Progressing   Problem: Fluid Volume: Goal: Ability to maintain a balanced intake and output will improve Outcome: Progressing   Problem: Metabolic: Goal: Ability to maintain appropriate glucose levels will improve Outcome: Progressing   Problem: Nutritional: Goal: Maintenance of adequate nutrition will improve Outcome: Progressing   Problem: Tissue Perfusion: Goal: Adequacy of tissue perfusion will improve Outcome: Progressing

## 2023-09-11 NOTE — Progress Notes (Signed)
 [facility]  ROUNDING NOTE   Subjective:   Patient resting in bed Alert and oriented Reports soreness at procedure site  Creatinine 2.1 Urine output in past 24 hours  Objective:  Vital signs in last 24 hours:  Temp:  [97.4 F (36.3 C)-98.5 F (36.9 C)] 98.1 F (36.7 C) (03/27 1144) Pulse Rate:  [80-87] 82 (03/27 1144) Resp:  [11-18] 17 (03/27 1144) BP: (121-141)/(70-82) 121/77 (03/27 1144) SpO2:  [89 %-94 %] 91 % (03/27 1144)  Weight change:  Filed Weights   09/06/23 1425 09/06/23 2014  Weight: 117.9 kg 120.6 kg    Intake/Output: I/O last 3 completed shifts: In: 1666.9 [P.O.:960; IV Piggyback:706.9] Out: 3900 [Urine:3900]   Intake/Output this shift:  Total I/O In: 240 [P.O.:240] Out: 650 [Urine:650]  Physical Exam: General: NAD  Head: Normocephalic, atraumatic. Moist oral mucosal membranes  Eyes: Anicteric  Lungs:  Clear to auscultation, normal effort  Heart: Regular rate and rhythm  Abdomen:  Soft, nontender,   Extremities:  No peripheral edema.  Neurologic: Alert and oriented, moving all four extremities  Skin: No lesions   Basic Metabolic Panel: Recent Labs  Lab 09/07/23 0502 09/08/23 0622 09/09/23 0720 09/10/23 0413 09/10/23 0840 09/11/23 0423  NA 139 139 140  --  137 140  K 4.5 4.0 3.9  --  4.4 4.1  CL 109 108 108  --  104 103  CO2 24 22 25   --  24 23  GLUCOSE 89 97 105*  --  157* 133*  BUN 35* 27* 21  --  21 21  CREATININE 2.28* 2.29* 2.11* 2.44* 2.36* 2.21*  CALCIUM 8.5* 8.5* 8.5*  --  8.1* 8.6*    Liver Function Tests: Recent Labs  Lab 09/06/23 1428 09/10/23 0840  AST 23 17  ALT 22 21  ALKPHOS 37* 50  BILITOT 2.7* 1.2  PROT 6.8 6.1*  ALBUMIN 4.0 2.7*   Recent Labs  Lab 09/06/23 1428  LIPASE 28   No results for input(s): "AMMONIA" in the last 168 hours.  CBC: Recent Labs  Lab 09/07/23 0502 09/08/23 0622 09/09/23 0720 09/10/23 0840 09/11/23 0423  WBC 17.1* 12.3* 9.9 11.6* 10.1  NEUTROABS  --    --   --  8.8* 7.4  HGB 12.6* 12.0* 11.8* 11.4* 12.2*  HCT 38.0* 34.6* 35.4* 34.0* 36.6*  MCV 95.2 93.5 93.7 93.9 95.8  PLT 111* 107* 120* 147* 163    Cardiac Enzymes: No results for input(s): "CKTOTAL", "CKMB", "CKMBINDEX", "TROPONINI" in the last 168 hours.  BNP: Invalid input(s): "POCBNP"  CBG: Recent Labs  Lab 09/10/23 1126 09/10/23 1607 09/10/23 2045 09/11/23 0852 09/11/23 1141  GLUCAP 176* 207* 185* 154* 147*    Microbiology: Results for orders placed or performed during the hospital encounter of 09/06/23  Blood Culture (routine x 2)     Status: None   Collection Time: 09/06/23  3:53 PM   Specimen: BLOOD RIGHT ARM  Result Value Ref Range Status   Specimen Description BLOOD RIGHT ARM  Final   Special Requests   Final    BOTTLES DRAWN AEROBIC AND ANAEROBIC Blood Culture results may not be optimal due to an inadequate volume of blood received in culture bottles   Culture   Final    NO GROWTH 5 DAYS Performed at [facility], [address], [place], [place] [address]    Report Status 09/11/2023 FINAL  Final  Blood Culture (routine x 2)     Status: None   Collection Time:  09/06/23  3:53 PM   Specimen: BLOOD  Result Value Ref Range Status   Specimen Description BLOOD LEFT ANTECUBITAL  Final   Special Requests   Final    BOTTLES DRAWN AEROBIC AND ANAEROBIC Blood Culture adequate volume   Culture   Final    NO GROWTH 5 DAYS Performed at [facility], [address] [place], [place] [address]    Report Status 09/11/2023 FINAL  Final  Resp panel by RT-PCR (RSV, Flu A&B, Covid) Anterior Nasal Swab     Status: None   Collection Time: 09/06/23  5:01 PM   Specimen: Anterior Nasal Swab  Result Value Ref Range Status   SARS Coronavirus 2 by RT PCR NEGATIVE NEGATIVE Final    Comment: (NOTE) SARS-CoV-2 target nucleic acids are NOT DETECTED.  The SARS-CoV-2 RNA is generally detectable in upper respiratory specimens during the acute phase of  infection. The lowest concentration of SARS-CoV-2 viral copies this assay can detect is 138 copies/mL. A negative result does not preclude SARS-Cov-2 infection and should not be used as the sole basis for treatment or other patient management decisions. A negative result may occur with  improper specimen collection/handling, submission of specimen other than nasopharyngeal swab, presence of viral mutation(s) within the areas targeted by this assay, and inadequate number of viral copies(<138 copies/mL). A negative result must be combined with clinical observations, patient history, and epidemiological information. The expected result is Negative.  Fact Sheet for Patients:  BloggerCourse.com  Fact Sheet for Healthcare Providers:  SeriousBroker.it  This test is no t yet approved or cleared by the [place] FDA and  has been authorized for detection and/or diagnosis of SARS-CoV-2 by FDA under an Emergency Use Authorization (EUA). This EUA will remain  in effect (meaning this test can be used) for the duration of the COVID-19 declaration under Section 564(b)(1) of the Act, 21 U.S.C.section 360bbb-3(b)(1), unless the authorization is terminated  or revoked sooner.       Influenza A by PCR NEGATIVE NEGATIVE Final   Influenza B by PCR NEGATIVE NEGATIVE Final    Comment: (NOTE) The Xpert Xpress SARS-CoV-2/FLU/RSV plus assay is intended as an aid in the diagnosis of influenza from Nasopharyngeal swab specimens and should not be used as a sole basis for treatment. Nasal washings and aspirates are unacceptable for Xpert Xpress SARS-CoV-2/FLU/RSV testing.  Fact Sheet for Patients: BloggerCourse.com  Fact Sheet for Healthcare Providers: SeriousBroker.it  This test is not yet approved or cleared by the [place] FDA and has been authorized for detection and/or diagnosis of SARS-CoV-2  by FDA under an Emergency Use Authorization (EUA). This EUA will remain in effect (meaning this test can be used) for the duration of the COVID-19 declaration under Section 564(b)(1) of the Act, 21 U.S.C. section 360bbb-3(b)(1), unless the authorization is terminated or revoked.     Resp Syncytial Virus by PCR NEGATIVE NEGATIVE Final    Comment: (NOTE) Fact Sheet for Patients: BloggerCourse.com  Fact Sheet for Healthcare Providers: SeriousBroker.it  This test is not yet approved or cleared by the [place] FDA and has been authorized for detection and/or diagnosis of SARS-CoV-2 by FDA under an Emergency Use Authorization (EUA). This EUA will remain in effect (meaning this test can be used) for the duration of the COVID-19 declaration under Section 564(b)(1) of the Act, 21 U.S.C. section 360bbb-3(b)(1), unless the authorization is terminated or revoked.  Performed at [facility], [address] [place], [place] [address]   Aerobic/Anaerobic Culture w  Gram Stain (surgical/deep wound)     Status: None (Preliminary result)   Collection Time: 09/09/23  3:19 PM   Specimen: Abscess  Result Value Ref Range Status   Specimen Description   Final    ABSCESS Performed at [facility], [address], [place], [place] [address]    Special Requests PERINEAL INFECTION 1 SPEC A  Final   Gram Stain   Final    RARE WBC PRESENT, PREDOMINANTLY PMN FEW GRAM POSITIVE COCCI RARE GRAM NEGATIVE RODS    Culture   Final    CULTURE REINCUBATED FOR BETTER GROWTH Performed at [facility], [address], [place], [place] [address]    Report Status PENDING  Incomplete  Aerobic/Anaerobic Culture w Gram Stain (surgical/deep wound)     Status: None (Preliminary result)   Collection Time: 09/09/23  3:27 PM   Specimen: Abscess  Result Value Ref Range Status   Specimen Description   Final    ABSCESS Performed at [facility], [address], [place], [place] [address]    Special Requests PERINEAL INFECTION 2 SPEC B  Final   Gram Stain   Final    RARE WBC PRESENT, PREDOMINANTLY PMN FEW GRAM POSITIVE COCCI RARE GRAM NEGATIVE RODS Performed at [facility], [address], [place], [place] [address]    Culture PENDING  Incomplete   Report Status PENDING  Incomplete  Aerobic/Anaerobic Culture w Gram Stain (surgical/deep wound)     Status: None (Preliminary result)   Collection Time: 09/09/23  3:27 PM   Specimen: Abscess  Result Value Ref Range Status   Specimen Description   Final    ABSCESS Performed at [facility], [address] [place], [place] [address]    Special Requests PERINEAL INFECTION 3 SPEC C  Final   Gram Stain   Final    RARE WBC PRESENT, PREDOMINANTLY PMN RARE GRAM POSITIVE COCCI    Culture   Final    CULTURE REINCUBATED FOR BETTER GROWTH Performed at [facility], [address], [place], [place] [address]    Report Status PENDING  Incomplete    Coagulation Studies: No results for input(s): "LABPROT", "INR" in the last 72 hours.   Urinalysis: No results for input(s): "COLORURINE", "LABSPEC", "PHURINE", "GLUCOSEU", "HGBUR", "BILIRUBINUR", "KETONESUR", "PROTEINUR", "UROBILINOGEN", "NITRITE", "LEUKOCYTESUR" in the last 72 hours.  Invalid input(s): "APPERANCEUR"     Imaging: No results found.     Medications:    piperacillin-tazobactam (ZOSYN)  IV 3.375 g (09/11/23 0543)   vancomycin      amLODipine  10 mg Oral Daily   aspirin EC  81 mg Oral Daily   enoxaparin (LOVENOX) injection  0.5 mg/kg Subcutaneous Q24H   feeding supplement  1 Container Oral TID BM   gabapentin  300 mg Oral TID   insulin aspart  0-15 Units Subcutaneous TID WC   insulin aspart  0-5 Units Subcutaneous QHS   methocarbamol  500 mg Oral BID   nortriptyline  10 mg Oral BID   pantoprazole  40 mg Oral Daily   predniSONE  10 mg Oral Q breakfast   rosuvastatin  20  mg Oral Daily   senna-docusate  1 tablet Oral BID   sodium hypochlorite   Irrigation BID   tacrolimus  0.5 mg Oral q morning   tacrolimus  1 mg Oral QHS   tamsulosin  0.8 mg Oral Daily   trimethoprim  100 mg Oral Daily   acetaminophen **OR**  acetaminophen, calcium carbonate, HYDROmorphone (DILAUDID) injection, magnesium hydroxide, menthol-cetylpyridinium, ondansetron **OR** ondansetron (ZOFRAN) IV, oxyCODONE, traZODone  Assessment/ Plan:  [name] is a 68 y.o.  male with a PMHx of hypertension, coronary artery disease, congestive heart failure, diabetes, BPH, history of CVA, history of chronic kidney disease stage IIIa and a history of liver cirrhosis s/p transplant in 2009.  Patient now being admitted with history of diarrhea, abdominal pain and syncope/presyncopal episodes.  Patient required CT scan of the abdomen and pelvis with IV contrast to rule out any acute abdominal process. He has been on mycophenolate, tacrolimus and prednisone. He is also on Bactrim, furosemide and ACE inhibitor's.  Acute kidney injury on chronic kidney disease: Patient has baseline creatinine 1.6.  This may be secondary to prerenal azotemia. Patient also received IV contrast.  Creatinine continues to improve with stable blood pressure. Will continue to monitor and encourage adequate hydration.  Lab Results  Component Value Date   CREATININE 2.21 (H) 09/11/2023   CREATININE 2.36 (H) 09/10/2023   CREATININE 2.44 (H) 09/10/2023    Intake/Output Summary (Last 24 hours) at 09/11/2023 1410 Last data filed at 09/11/2023 1107 Gross per 24 hour  Intake 1126.93 ml  Output 2950 ml  Net -1823.07 ml    #2: Sepsis/Perirectal abscess and diarrhea: Continue present medications including Rocephin,  and vancomycin.   General surgery performed I&D 09/09/23  Wound packed with gauze. Management per surgery   #3: History of liver transplant: Can continue tacrolimus and prednisone.  Will hold the mycophenolate to  monitor cultures.   #4: Hypertension: Remains on amlodipine.  Lisinopril held   Blood pressure stable    LOS: 5 Geraldine Tesar 3/27/20252:10 PM

## 2023-09-12 DIAGNOSIS — A419 Sepsis, unspecified organism: Secondary | ICD-10-CM | POA: Diagnosis not present

## 2023-09-12 LAB — BASIC METABOLIC PANEL WITH GFR
Anion gap: 9 (ref 5–15)
BUN: 23 mg/dL (ref 8–23)
CO2: 26 mmol/L (ref 22–32)
Calcium: 9.2 mg/dL (ref 8.9–10.3)
Chloride: 105 mmol/L (ref 98–111)
Creatinine, Ser: 2 mg/dL — ABNORMAL HIGH (ref 0.61–1.24)
GFR, Estimated: 36 mL/min — ABNORMAL LOW (ref >60)
Glucose, Bld: 156 mg/dL — ABNORMAL HIGH (ref 70–99)
Potassium: 5.8 mmol/L — ABNORMAL HIGH (ref 3.5–5.1)
Sodium: 140 mmol/L (ref 135–145)

## 2023-09-12 LAB — CBC WITH DIFFERENTIAL/PLATELET
Abs Immature Granulocytes: 0.53 10*3/uL — ABNORMAL HIGH (ref 0.00–0.07)
Basophils Absolute: 0.1 10*3/uL (ref 0.0–0.1)
Basophils Relative: 1 %
Eosinophils Absolute: 0.4 10*3/uL (ref 0.0–0.5)
Eosinophils Relative: 4 %
HCT: 36.8 % — ABNORMAL LOW (ref 39.0–52.0)
Hemoglobin: 11.9 g/dL — ABNORMAL LOW (ref 13.0–17.0)
Immature Granulocytes: 4 %
Lymphocytes Relative: 9 %
Lymphs Abs: 1.1 10*3/uL (ref 0.7–4.0)
MCH: 31.3 pg (ref 26.0–34.0)
MCHC: 32.3 g/dL (ref 30.0–36.0)
MCV: 96.8 fL (ref 80.0–100.0)
Monocytes Absolute: 0.9 10*3/uL (ref 0.1–1.0)
Monocytes Relative: 7 %
Neutro Abs: 9 10*3/uL — ABNORMAL HIGH (ref 1.7–7.7)
Neutrophils Relative %: 75 %
Platelets: 194 10*3/uL (ref 150–400)
RBC: 3.8 MIL/uL — ABNORMAL LOW (ref 4.22–5.81)
RDW: 14.3 % (ref 11.5–15.5)
WBC: 12 10*3/uL — ABNORMAL HIGH (ref 4.0–10.5)
nRBC: 0 % (ref 0.0–0.2)

## 2023-09-12 LAB — GLUCOSE, CAPILLARY
Glucose-Capillary: 142 mg/dL — ABNORMAL HIGH (ref 70–99)
Glucose-Capillary: 149 mg/dL — ABNORMAL HIGH (ref 70–99)
Glucose-Capillary: 183 mg/dL — ABNORMAL HIGH (ref 70–99)
Glucose-Capillary: 206 mg/dL — ABNORMAL HIGH (ref 70–99)

## 2023-09-12 MED ORDER — SODIUM ZIRCONIUM CYCLOSILICATE 10 G PO PACK
10.0000 g | PACK | Freq: Once | ORAL | Status: AC
  Administered 2023-09-12: 10 g via ORAL
  Filled 2023-09-12 (×2): qty 1

## 2023-09-12 MED ORDER — MYCOPHENOLATE MOFETIL 250 MG PO CAPS
250.0000 mg | ORAL_CAPSULE | Freq: Every evening | ORAL | Status: DC
  Administered 2023-09-12 – 2023-09-13 (×2): 250 mg via ORAL
  Filled 2023-09-12 (×4): qty 1

## 2023-09-12 NOTE — Progress Notes (Signed)
 [facility] SURGICAL PROGRESS NOTE  Hospital Day(s): 6.   Post op day(s): 3 Days Post-Op.   Interval History:  Patient seen and examined No acute events or new complaints overnight.  Patient reports his wound remains sore; som burning pain this AM No fever, chills  Slight bump in WBC this AM; 12.0K Hgb to 11.9; stable  sCr improving - 2.00; UO - 650 ccs Hyperkalemia 5.8 Cx with GPC, GNR He is on Vancomycin and Zosyn  Vital signs in last 24 hours: [min-max] current  Temp:  [97.5 F (36.4 C)-98.5 F (36.9 C)] 97.8 F (36.6 C) (03/28 0423) Pulse Rate:  [75-85] 76 (03/28 0423) Resp:  [17-20] 20 (03/28 0423) BP: (121-152)/(77-98) 152/98 (03/28 0423) SpO2:  [88 %-91 %] 90 % (03/28 0423)     Height: 6\' 1"  (185.4 cm) Weight: 120.6 kg BMI (Calculated): 35.09   Intake/Output last 2 shifts:  03/27 0701 - 03/28 0700 In: 240 [P.O.:240] Out: 650 [Urine:650]   Physical Exam:  Constitutional: alert, cooperative and no distress  Respiratory: breathing non-labored at rest  Cardiovascular: regular rate and sinus rhythm  Integumentary: 10 x  x 3 cm wound to the right perineum, tissue appears healthy without gross necrosis, punctate bleeding    Labs:     Latest Ref Rng & Units 09/12/2023    4:18 AM 09/11/2023    4:23 AM 09/10/2023    8:40 AM  CBC  WBC 4.0 - 10.5 K/uL 12.0  10.1  11.6   Hemoglobin 13.0 - 17.0 g/dL 16.1  09.6  04.5   Hematocrit 39.0 - 52.0 % 36.8  36.6  34.0   Platelets 150 - 400 K/uL 194  163  147       Latest Ref Rng & Units 09/12/2023    4:18 AM 09/11/2023    4:23 AM 09/10/2023    8:40 AM  CMP  Glucose 70 - 99 mg/dL 409  811  914   BUN 8 - 23 mg/dL 23  21  21    Creatinine 0.61 - 1.24 mg/dL 7.82  9.56  2.13   Sodium 135 - 145 mmol/L 140  140  137   Potassium 3.5 - 5.1 mmol/L 5.8  4.1  4.4   Chloride 98 - 111 mmol/L 105  103  104   CO2 22 - 32 mmol/L 26  23  24    Calcium 8.9 - 10.3 mg/dL 9.2  8.6  8.1   Total Protein 6.5 - 8.1 g/dL   6.1    Total Bilirubin 0.0 - 1.2 mg/dL   1.2   Alkaline Phos 38 - 126 U/L   50   AST 15 - 41 U/L   17   ALT 0 - 44 U/L   21     Imaging studies: No new pertinent imaging studies   Assessment/Plan:  68 y.o. male 3 Days Post-Op s/p incision and drainage of complex perineal abscess with wide debridement of skin subcutaneous tissue and fascia, complicated by pertinent comorbidities including liver transplant on immunosuppression, DM.   - Wound Care: Today is last day of BID Dakin's dressing changes. Tomorrow (03/29) can transition to daily wet-to-dry changes and PRN  - Continue Abx (Zosyn, Vancomycin); follow up Cx when available   - Pain control prn  - Monitor leukocytosis  - Correct potassium   - Hyperglycemic control   - Okay to mobilize as tolerated  - Further management per primary service; we will follow   All of the above findings and  recommendations were discussed with the patient, and the medical team, and all of patient's questions were answered to his expressed satisfaction.  -- Lynden Oxford, PA-C [facility] 09/12/2023, 7:22 AM M-F: 7am - 4pm

## 2023-09-12 NOTE — TOC Progression Note (Signed)
 Transition of Care [facility]) - Progression Note    Patient Details  Name: Devin White MRN: [id number] Date of Birth: [birth year]  Transition of Care [facility]) CM/SW Contact  Cherre Blanc, RN Phone Number: 09/12/2023, 11:22 AM  Clinical Narrative:    Plan for home w/ [facility] PT/OT when medically clear for discharge.   Expected Discharge Plan: Home w Home Health Services Barriers to Discharge: Continued Medical Work up  Expected Discharge Plan and Services   Discharge Planning Services: CM Consult   Living arrangements for the past 2 months: Single Family Home                                       Social Determinants of Health (SDOH) Interventions SDOH Screenings   Food Insecurity: No Food Insecurity (09/06/2023)  Housing: Low Risk  (09/07/2023)  Recent Concern: Housing - High Risk (07/28/2023)  Transportation Needs: No Transportation Needs (09/06/2023)  Utilities: Not At Risk (09/06/2023)  Depression (PHQ2-9): Low Risk  (02/27/2023)  Financial Resource Strain: Low Risk  (07/28/2023)  Physical Activity: Insufficiently Active (07/28/2023)  Social Connections: Socially Isolated (09/06/2023)  Stress: No Stress Concern Present (07/28/2023)  Tobacco Use: Low Risk  (09/09/2023)    Readmission Risk Interventions    11/09/2022    1:38 PM 01/31/2022    9:58 AM 11/06/2021   11:09 AM  Readmission Risk Prevention Plan  Transportation Screening Complete Complete Complete  PCP or Specialist Appt within 5-7 Days Complete  Complete  PCP or Specialist Appt within 3-5 Days  Complete   Home Care Screening Complete  Complete  Medication Review (RN CM) Referral to Pharmacy  Referral to Pharmacy  Social Work Consult for Recovery Care Planning/Counseling  Complete   Palliative Care Screening  Not Applicable   Medication Review Oceanographer)  Complete

## 2023-09-12 NOTE — Progress Notes (Addendum)
 [facility]  ROUNDING NOTE   Subjective:   Patient resting in bed Alert and oriented Reports pain due to recent dressing change.   Creatinine 2.00  Objective:  Vital signs in last 24 hours:  Temp:  [97.5 F (36.4 C)-99 F (37.2 C)] 99 F (37.2 C) (03/28 1139) Pulse Rate:  [74-85] 74 (03/28 1139) Resp:  [16-20] 16 (03/28 1139) BP: (138-155)/(79-98) 139/87 (03/28 1139) SpO2:  [88 %-93 %] 93 % (03/28 1139)  Weight change:  Filed Weights   09/06/23 1425 09/06/23 2014  Weight: 117.9 kg 120.6 kg    Intake/Output: I/O last 3 completed shifts: In: 1032 [P.O.:720; IV Piggyback:312] Out: 2150 [Urine:2150]   Intake/Output this shift:  Total I/O In: 72 [P.O.:72] Out: 750 [Urine:750]  Physical Exam: General: NAD  Head: Normocephalic, atraumatic. Moist oral mucosal membranes  Eyes: Anicteric  Lungs:  Clear to auscultation, normal effort  Heart: Regular rate and rhythm  Abdomen:  Soft, nontender  Extremities:  No peripheral edema.  Neurologic: Alert and oriented, moving all four extremities  Skin: No lesions, perineal abscess wound   Basic Metabolic Panel: Recent Labs  Lab 09/08/23 0622 09/09/23 0720 09/10/23 0413 09/10/23 0840 09/11/23 0423 09/12/23 0418  NA 139 140  --  137 140 140  K 4.0 3.9  --  4.4 4.1 5.8*  CL 108 108  --  104 103 105  CO2 22 25  --  24 23 26   GLUCOSE 97 105*  --  157* 133* 156*  BUN 27* 21  --  21 21 23   CREATININE 2.29* 2.11* 2.44* 2.36* 2.21* 2.00*  CALCIUM 8.5* 8.5*  --  8.1* 8.6* 9.2    Liver Function Tests: Recent Labs  Lab 09/06/23 1428 09/10/23 0840  AST 23 17  ALT 22 21  ALKPHOS 37* 50  BILITOT 2.7* 1.2  PROT 6.8 6.1*  ALBUMIN 4.0 2.7*   Recent Labs  Lab 09/06/23 1428  LIPASE 28   No results for input(s): "AMMONIA" in the last 168 hours.  CBC: Recent Labs  Lab 09/08/23 0622 09/09/23 0720 09/10/23 0840 09/11/23 0423 09/12/23 0418  WBC 12.3* 9.9 11.6* 10.1 12.0*  NEUTROABS  --   --  8.8* 7.4  9.0*  HGB 12.0* 11.8* 11.4* 12.2* 11.9*  HCT 34.6* 35.4* 34.0* 36.6* 36.8*  MCV 93.5 93.7 93.9 95.8 96.8  PLT 107* 120* 147* 163 194    Cardiac Enzymes: No results for input(s): "CKTOTAL", "CKMB", "CKMBINDEX", "TROPONINI" in the last 168 hours.  BNP: Invalid input(s): "POCBNP"  CBG: Recent Labs  Lab 09/11/23 0852 09/11/23 1141 09/11/23 1635 09/11/23 2003 09/12/23 0803  GLUCAP 154* 147* 164* 169* 142*    Microbiology: Results for orders placed or performed during the hospital encounter of 09/06/23  Blood Culture (routine x 2)     Status: None   Collection Time: 09/06/23  3:53 PM   Specimen: BLOOD RIGHT ARM  Result Value Ref Range Status   Specimen Description BLOOD RIGHT ARM  Final   Special Requests   Final    BOTTLES DRAWN AEROBIC AND ANAEROBIC Blood Culture results may not be optimal due to an inadequate volume of blood received in culture bottles   Culture   Final    NO GROWTH 5 DAYS Performed at [facility], [address], [place], [place] [address]    Report Status 09/11/2023 FINAL  Final  Blood Culture (routine x 2)     Status: None   Collection Time: 09/06/23  3:53 PM  Specimen: BLOOD  Result Value Ref Range Status   Specimen Description BLOOD LEFT ANTECUBITAL  Final   Special Requests   Final    BOTTLES DRAWN AEROBIC AND ANAEROBIC Blood Culture adequate volume   Culture   Final    NO GROWTH 5 DAYS Performed at [facility], [address] [place], [place] [address]    Report Status 09/11/2023 FINAL  Final  Resp panel by RT-PCR (RSV, Flu A&B, Covid) Anterior Nasal Swab     Status: None   Collection Time: 09/06/23  5:01 PM   Specimen: Anterior Nasal Swab  Result Value Ref Range Status   SARS Coronavirus 2 by RT PCR NEGATIVE NEGATIVE Final    Comment: (NOTE) SARS-CoV-2 target nucleic acids are NOT DETECTED.  The SARS-CoV-2 RNA is generally detectable in upper respiratory specimens during the acute phase of infection. The  lowest concentration of SARS-CoV-2 viral copies this assay can detect is 138 copies/mL. A negative result does not preclude SARS-Cov-2 infection and should not be used as the sole basis for treatment or other patient management decisions. A negative result may occur with  improper specimen collection/handling, submission of specimen other than nasopharyngeal swab, presence of viral mutation(s) within the areas targeted by this assay, and inadequate number of viral copies(<138 copies/mL). A negative result must be combined with clinical observations, patient history, and epidemiological information. The expected result is Negative.  Fact Sheet for Patients:  BloggerCourse.com  Fact Sheet for Healthcare Providers:  SeriousBroker.it  This test is no t yet approved or cleared by the [place] FDA and  has been authorized for detection and/or diagnosis of SARS-CoV-2 by FDA under an Emergency Use Authorization (EUA). This EUA will remain  in effect (meaning this test can be used) for the duration of the COVID-19 declaration under Section 564(b)(1) of the Act, 21 U.S.C.section 360bbb-3(b)(1), unless the authorization is terminated  or revoked sooner.       Influenza A by PCR NEGATIVE NEGATIVE Final   Influenza B by PCR NEGATIVE NEGATIVE Final    Comment: (NOTE) The Xpert Xpress SARS-CoV-2/FLU/RSV plus assay is intended as an aid in the diagnosis of influenza from Nasopharyngeal swab specimens and should not be used as a sole basis for treatment. Nasal washings and aspirates are unacceptable for Xpert Xpress SARS-CoV-2/FLU/RSV testing.  Fact Sheet for Patients: BloggerCourse.com  Fact Sheet for Healthcare Providers: SeriousBroker.it  This test is not yet approved or cleared by the [place] FDA and has been authorized for detection and/or diagnosis of SARS-CoV-2 by FDA under  an Emergency Use Authorization (EUA). This EUA will remain in effect (meaning this test can be used) for the duration of the COVID-19 declaration under Section 564(b)(1) of the Act, 21 U.S.C. section 360bbb-3(b)(1), unless the authorization is terminated or revoked.     Resp Syncytial Virus by PCR NEGATIVE NEGATIVE Final    Comment: (NOTE) Fact Sheet for Patients: BloggerCourse.com  Fact Sheet for Healthcare Providers: SeriousBroker.it  This test is not yet approved or cleared by the [place] FDA and has been authorized for detection and/or diagnosis of SARS-CoV-2 by FDA under an Emergency Use Authorization (EUA). This EUA will remain in effect (meaning this test can be used) for the duration of the COVID-19 declaration under Section 564(b)(1) of the Act, 21 U.S.C. section 360bbb-3(b)(1), unless the authorization is terminated or revoked.  Performed at [facility], [address], [place], [place] [address]   Aerobic/Anaerobic Culture w Gram Stain (surgical/deep wound)  Status: None (Preliminary result)   Collection Time: 09/09/23  3:19 PM   Specimen: Abscess  Result Value Ref Range Status   Specimen Description   Final    ABSCESS Performed at [facility], [address] [place], [place] [address]    Special Requests PERINEAL INFECTION 1 SPEC A  Final   Gram Stain   Final    RARE WBC PRESENT, PREDOMINANTLY PMN FEW GRAM POSITIVE COCCI RARE GRAM NEGATIVE RODS    Culture   Final    RARE STAPHYLOCOCCUS HAEMOLYTICUS FEW STREPTOCOCCUS CONSTELLATUS SUSCEPTIBILITIES TO FOLLOW HOLDING FOR POSSIBLE ANAEROBE Performed at [facility], [address], [place], [place] [address]    Report Status PENDING  Incomplete  Aerobic/Anaerobic Culture w Gram Stain (surgical/deep wound)     Status: None (Preliminary result)   Collection Time: 09/09/23  3:27 PM   Specimen: Abscess  Result Value Ref Range  Status   Specimen Description   Final    ABSCESS Performed at [facility], [address] [place], [place] [address]    Special Requests PERINEAL INFECTION 2 SPEC B  Final   Gram Stain   Final    RARE WBC PRESENT, PREDOMINANTLY PMN FEW GRAM POSITIVE COCCI RARE GRAM NEGATIVE RODS    Culture   Final    FEW STREPTOCOCCUS CONSTELLATUS HOLDING FOR POSSIBLE ANAEROBE Performed at [facility], [address], [place], [place] [address]    Report Status PENDING  Incomplete  Aerobic/Anaerobic Culture w Gram Stain (surgical/deep wound)     Status: None (Preliminary result)   Collection Time: 09/09/23  3:27 PM   Specimen: Abscess  Result Value Ref Range Status   Specimen Description   Final    ABSCESS Performed at [facility], [address] [place], [place] [address]    Special Requests PERINEAL INFECTION 3 SPEC C  Final   Gram Stain   Final    RARE WBC PRESENT, PREDOMINANTLY PMN RARE GRAM POSITIVE COCCI    Culture   Final    FEW STREPTOCOCCUS CONSTELLATUS HOLDING FOR POSSIBLE ANAEROBE Performed at [facility], [address], [place], [place] [address]    Report Status PENDING  Incomplete    Coagulation Studies: No results for input(s): "LABPROT", "INR" in the last 72 hours.   Urinalysis: No results for input(s): "COLORURINE", "LABSPEC", "PHURINE", "GLUCOSEU", "HGBUR", "BILIRUBINUR", "KETONESUR", "PROTEINUR", "UROBILINOGEN", "NITRITE", "LEUKOCYTESUR" in the last 72 hours.  Invalid input(s): "APPERANCEUR"     Imaging: No results found.     Medications:    piperacillin-tazobactam (ZOSYN)  IV 3.375 g (09/12/23 0646)   vancomycin 1,500 mg (09/11/23 1658)    amLODipine  10 mg Oral Daily   aspirin EC  81 mg Oral Daily   enoxaparin (LOVENOX) injection  0.5 mg/kg Subcutaneous Q24H   feeding supplement  1 Container Oral TID BM   gabapentin  300 mg Oral TID   insulin aspart  0-15 Units Subcutaneous TID WC   insulin aspart  0-5 Units  Subcutaneous QHS   methocarbamol  500 mg Oral BID   nortriptyline  10 mg Oral BID   pantoprazole  40 mg Oral Daily   predniSONE  10 mg Oral Q breakfast   rosuvastatin  20 mg Oral Daily   senna-docusate  1 tablet Oral BID   sodium hypochlorite   Irrigation BID   sodium zirconium cyclosilicate  10 g Oral Once   tacrolimus  0.5 mg Oral q morning   tacrolimus  1 mg Oral QHS   tamsulosin  0.8 mg Oral Daily   trimethoprim  100 mg Oral Daily   acetaminophen **OR** acetaminophen, calcium carbonate, HYDROmorphone (DILAUDID) injection, magnesium hydroxide, menthol-cetylpyridinium, ondansetron **OR** ondansetron (ZOFRAN) IV, oxyCODONE, traZODone  Assessment/ Plan:  [name] is a 68 y.o.  male with a PMHx of hypertension, coronary artery disease, congestive heart failure, diabetes, BPH, history of CVA, history of chronic kidney disease stage IIIa and a history of liver cirrhosis s/p transplant in 2009.  Patient now being admitted with history of diarrhea, abdominal pain and syncope/presyncopal episodes.  Patient required CT scan of the abdomen and pelvis with IV contrast to rule out any acute abdominal process. He has been on mycophenolate, tacrolimus and prednisone. He is also on Bactrim, furosemide and ACE inhibitor's.  Acute kidney injury on chronic kidney disease: Patient has baseline creatinine 1.6.  This may be secondary to prerenal azotemia. Patient also received IV contrast.  Creatinine continues to improve with adequate urine output. Continue to monitor renal indices and encourage oral intake.   Lab Results  Component Value Date   CREATININE 2.00 (H) 09/12/2023   CREATININE 2.21 (H) 09/11/2023   CREATININE 2.36 (H) 09/10/2023    Intake/Output Summary (Last 24 hours) at 09/12/2023 1147 Last data filed at 09/12/2023 0944 Gross per 24 hour  Intake 72 ml  Output 750 ml  Net -678 ml    #2: Sepsis/Perirectal abscess and diarrhea: Continue present medications including  Rocephin,  and vancomycin.   General surgery performed I&D 09/09/23  Surgery managing wound dressing change.    #3: History of liver transplant: Can continue tacrolimus and prednisone.  Will hold the mycophenolate and monitor cultures.   #4: Hypertension: Remains on amlodipine.  Lisinopril held   Blood pressure acceptable.   #5 Hyperkalemia, 5.8. Will order Lokelma 10g once.     LOS: 6 Verle Brillhart 3/28/202511:47 AM

## 2023-09-12 NOTE — Progress Notes (Signed)
 OT Cancellation Note  Patient Details Name: JOSHUA SOULIER MRN: [id number] DOB: 1956-04-09   Cancelled Treatment:    Reason Eval/Treat Not Completed: Other (comment). Pt with significant pain earlier this morning. Also note K+ falling outside guidelines for exertional activity. Currently being addressed. Will re-attempt as appropriate.   Arman Filter., MPH, MS, OTR/L ascom [phone] 09/12/23, 11:08 AM

## 2023-09-12 NOTE — Progress Notes (Signed)
 Progress Note   Patient: Devin White:[id number] DOB: [birth year] DOA: 09/06/2023     6 DOS: the patient was seen and examined on 09/12/2023   Brief hospital course: 67yo with h/o cirrhosis s/p liver transplant (2009) on immunosuppression, chronic HFpEF, GERD, BPH, HTN, CVA, stage 3a CKD, and gout who presented on 3/22 with presyncope.  He was determined to have sepsis due to perirectal abscess and necrotizing soft tissue infection of the perineum and underwent I&D and wide debridement on 3/25.    Assessment and Plan:  Sepsis due to suspected Fornier's gangrene Patient has a history of C. difficile colitis, most recently approximately 2 years ago No recent exposure to antibiotics however the patient is status post liver transplant on chronic immunosuppression Perirectal abscess, general surgery consulted Gas noted on CT of the pelvis on 3/25 concerning for necrotizing infection Underwent urgent surgical exploration 3/25 Continue IV antibiotics (Zosyn, Vanc) Surgery still following, will reassess tomorrow for possible further debridement BID wet-to-dry dressing changes with Dakins Wound cultures with Staph haemolyticus and Strep constellatus   Acute kidney injury superimposed on stage 3a chronic kidney disease Baseline creatinine 1.6, GFR 45-50 Presenting creatinine 2.67, GFR 26 Slowly improving, now 2, 36 Avoid nonessential nephrotoxins Continue to hold lisinopril   Type 2 diabetes mellitus with peripheral neuropathy Last A1c was 10.6, very poor control Hold metformin, glipizide Hold long-acting insulin for now   Sliding scale coverage Carb modified diet Continue gabapentin, nortriptyline   Essential hypertension Continue amlodipine Hold lisinopril   Dyslipidemia Continue rosuvastatin   Gout No evidence of exacerbation Hold colchicine   Status post liver transplant/Chronic immunosuppression Tacrolimus, prednisone continued on admission Cellcept resumed  (confirmed that this was ok with surgery) on 3/28 Continue trimethoprim for prophylaxis  BPH Continue tamsulosin   Class 2 Obesity Body mass index is 35.08 kg/m.Marland Kitchen  Weight loss should be encouraged Outpatient PCP/bariatric medicine f/u encouraged   Ambulatory dysfunction Recommended for SNF but he prefers home with home health He will need PT/OT and HHN for wound management His granddaughter (a former Museum/gallery conservator) will also be able to help with dressing changes Possible dc early next week        Consultants: Surgery Nephrology PT OT   Procedures: I&D of complex perineal abscess with wide debridement, 3/25   Antibiotics: Cefepime x 1 Cefotetan x 1 Ceftriaxone x 2  Zosyn 3/25- Vancomycin 3/25-   30 Day Unplanned Readmission Risk Score    Flowsheet Row ED to Hosp-Admission (Current) from 09/06/2023 in [facility] 1C MEDICAL TELEMETRY  30 Day Unplanned Readmission Risk Score (%) 26.1 Filed at 09/12/2023 0401       This score is the patient's risk of an unplanned readmission within 30 days of being discharged (0 -100%). The score is based on dignosis, age, lab data, medications, orders, and past utilization.   Low:  0-14.9   Medium: 15-21.9   High: 22-29.9   Extreme: 30 and above           Subjective: Continues to have rectal pain but moving better.  He was giving himself a sponge bath at the bedside today.   Objective: Vitals:   09/12/23 0802 09/12/23 1139  BP: (!) 155/87 139/87  Pulse: 85 74  Resp: 19 16  Temp: 98.4 F (36.9 C) 99 F (37.2 C)  SpO2: 92% 93%    Intake/Output Summary (Last 24 hours) at 09/12/2023 1348 Last data filed at 09/12/2023 0944 Gross per 24 hour  Intake 72 ml  Output 750 ml  Net -678 ml   Filed Weights   09/06/23 1425 09/06/23 2014  Weight: 117.9 kg 120.6 kg    Exam:  General:  Appears calm and comfortable and is in NAD, sitting up at bedside Eyes:   EOMI, normal lids, iris ENT:  grossly normal  hearing, lips & tongue, mmm Cardiovascular:  RRR, no m/r/g. No LE edema.  Respiratory:   CTA bilaterally with no wheezes/rales/rhonchi.  Normal respiratory effort. Abdomen:  soft, NT, ND Skin:  no rash or induration seen on limited exam Musculoskeletal:  grossly normal tone BUE/BLE, good ROM, no bony abnormality Psychiatric:  grossly normal mood and affect, speech fluent and appropriate, AOx3 Neurologic:  CN 2-12 grossly intact, moves all extremities in coordinated fashion  Data Reviewed: I have reviewed the patient's lab results since admission.  Pertinent labs for today include:   K+ 5.8 Glucose 156 BUN 23/Creatinine 2/GFR 36, improving WBC 12 Hgb 11.9 Wound cultures: staph haemolyticus, sensitivities pending    Family Communication: None present; I spoke with his daughter by telephone  Disposition: Status is: Inpatient Remains inpatient appropriate because: ongoing management     Time spent: 50 minutes  Unresulted Labs (From admission, onward)    None        Author: Jonah Blue, MD 09/12/2023 1:48 PM  For on call review [email or web address].

## 2023-09-12 NOTE — Care Management Important Message (Signed)
 Important Message  Patient Details  Name: Devin White MRN: [id number] Date of Birth: 22-Nov-1955   Important Message Given:  Yes - Medicare IM     Charmaine Placido, Stephan Minister 09/12/2023, 10:20 AM

## 2023-09-12 NOTE — Progress Notes (Signed)
 Pharmacy Antibiotic Note  Devin White is a 68 y.o. male admitted on 09/06/2023 with Necrotizing soft tissue infection perineum. They presented to the ED with presyncope and found to have an AKI on CKD due to bactrim furosemide and ACE inhibitors. They are s/p liver transplant 2009 on mycophenolate, tacrolimus and prednisone. During admission they were also determined to have sepsis secondary to perirectal abscess and necrotizing soft tissue infection of the perineum. They underwent I&J and wide debridement on 3/25. Pharmacy has been consulted for zosyn and vancomycin dosing.  Plan: Day 7 of antibiotics, Day 4 of vancomycin and Zosyn  Vancomycin 1500 mg IV Q24H. Goal AUC 400-550. Expected AUC: 443.6 Expected Css min: 11.8 SCr used: 2.0 Weight used: IBW, Vd used: 0.72 (BMI 35) Patient is also on Zosyn 3.375 g IV Q8H Continue to monitor renal function as AKI improves (baseline around 1.7) Consider dose adjustment if vancomycin is still indicated as renal function improves Still therapeutic today  Follow antibiotic plan with culture results   Height: 6\' 1"  (185.4 cm) Weight: 120.6 kg (265 lb 14 oz) IBW/kg (Calculated) : 79.9  Temp (24hrs), Avg:98.2 F (36.8 C), Min:97.5 F (36.4 C), Max:99 F (37.2 C)  Recent Labs  Lab 09/06/23 1553 09/06/23 2123 09/07/23 0502 09/08/23 0622 09/09/23 0720 09/10/23 0413 09/10/23 0840 09/11/23 0423 09/12/23 0418  WBC  --   --    < > 12.3* 9.9  --  11.6* 10.1 12.0*  CREATININE  --   --    < > 2.29* 2.11* 2.44* 2.36* 2.21* 2.00*  LATICACIDVEN 1.8 1.2  --   --   --   --   --   --   --    < > = values in this interval not displayed.    Estimated Creatinine Clearance: 50.1 mL/min (A) (by C-G formula based on SCr of 2 mg/dL (H)).    Allergies  Allergen Reactions   Levaquin [Levofloxacin] Shortness Of Breath    SOB and severe leg pain/weakness   Antimicrobials this admission: - 3/23 Ceftriaxone + 3/22 metronidazole>> 3/25 -- 3/25 Zosyn >>    -- 3/25 Vancomycin >>    Dose adjustments this admission: 3/27 Vancomycin from 1250 mg to 1500 mg due to improving renal function  Microbiology results: 3/22 BCx: NGTD   3/25 abscess cxs: few GPC, rare GNR; Streptococcus constellatus, holding for possible anaerobe   Thank you for allowing pharmacy to be a part of this patient's care.  Effie Shy, PharmD Pharmacy Resident  09/12/2023 4:02 PM

## 2023-09-13 DIAGNOSIS — A419 Sepsis, unspecified organism: Secondary | ICD-10-CM | POA: Diagnosis not present

## 2023-09-13 LAB — CBC WITH DIFFERENTIAL/PLATELET
Abs Immature Granulocytes: 1.05 10*3/uL — ABNORMAL HIGH (ref 0.00–0.07)
Basophils Absolute: 0.1 10*3/uL (ref 0.0–0.1)
Basophils Relative: 1 %
Eosinophils Absolute: 0.2 10*3/uL (ref 0.0–0.5)
Eosinophils Relative: 1 %
HCT: 37.1 % — ABNORMAL LOW (ref 39.0–52.0)
Hemoglobin: 12.6 g/dL — ABNORMAL LOW (ref 13.0–17.0)
Immature Granulocytes: 9 %
Lymphocytes Relative: 13 %
Lymphs Abs: 1.6 10*3/uL (ref 0.7–4.0)
MCH: 32.5 pg (ref 26.0–34.0)
MCHC: 34 g/dL (ref 30.0–36.0)
MCV: 95.6 fL (ref 80.0–100.0)
Monocytes Absolute: 1 10*3/uL (ref 0.1–1.0)
Monocytes Relative: 8 %
Neutro Abs: 8 10*3/uL — ABNORMAL HIGH (ref 1.7–7.7)
Neutrophils Relative %: 68 %
Platelets: 224 10*3/uL (ref 150–400)
RBC: 3.88 MIL/uL — ABNORMAL LOW (ref 4.22–5.81)
RDW: 14 % (ref 11.5–15.5)
Smear Review: NORMAL
WBC: 11.8 10*3/uL — ABNORMAL HIGH (ref 4.0–10.5)
nRBC: 0.3 % — ABNORMAL HIGH (ref 0.0–0.2)

## 2023-09-13 LAB — GLUCOSE, CAPILLARY
Glucose-Capillary: 112 mg/dL — ABNORMAL HIGH (ref 70–99)
Glucose-Capillary: 156 mg/dL — ABNORMAL HIGH (ref 70–99)
Glucose-Capillary: 205 mg/dL — ABNORMAL HIGH (ref 70–99)
Glucose-Capillary: 189 mg/dL — ABNORMAL HIGH (ref 70–99)

## 2023-09-13 LAB — BASIC METABOLIC PANEL WITH GFR
Anion gap: 10 (ref 5–15)
BUN: 31 mg/dL — ABNORMAL HIGH (ref 8–23)
CO2: 27 mmol/L (ref 22–32)
Calcium: 9.3 mg/dL (ref 8.9–10.3)
Chloride: 101 mmol/L (ref 98–111)
Creatinine, Ser: 1.96 mg/dL — ABNORMAL HIGH (ref 0.61–1.24)
GFR, Estimated: 37 mL/min — ABNORMAL LOW (ref >60)
Glucose, Bld: 146 mg/dL — ABNORMAL HIGH (ref 70–99)
Potassium: 4.8 mmol/L (ref 3.5–5.1)
Sodium: 138 mmol/L (ref 135–145)

## 2023-09-13 NOTE — Plan of Care (Signed)
 Pt has perirectal wound dressing CDI, pain controlled with oxycodone 10 mg po prn. Pt receiving IV Zosyn & Vanco. Bed alarm on for safety. Call light in reach.  Problem: Fluid Volume: Goal: Hemodynamic stability will improve Outcome: Progressing   Problem: Clinical Measurements: Goal: Diagnostic test results will improve Outcome: Progressing Goal: Signs and symptoms of infection will decrease Outcome: Progressing   Problem: Respiratory: Goal: Ability to maintain adequate ventilation will improve Outcome: Progressing   Problem: Education: Goal: Knowledge of General Education information will improve Description: Including pain rating scale, medication(s)/side effects and non-pharmacologic comfort measures Outcome: Progressing   Problem: Health Behavior/Discharge Planning: Goal: Ability to manage health-related needs will improve Outcome: Progressing   Problem: Clinical Measurements: Goal: Ability to maintain clinical measurements within normal limits will improve Outcome: Progressing Goal: Will remain free from infection Outcome: Progressing Goal: Diagnostic test results will improve Outcome: Progressing Goal: Respiratory complications will improve Outcome: Progressing Goal: Cardiovascular complication will be avoided Outcome: Progressing   Problem: Activity: Goal: Risk for activity intolerance will decrease Outcome: Progressing   Problem: Nutrition: Goal: Adequate nutrition will be maintained Outcome: Progressing   Problem: Coping: Goal: Level of anxiety will decrease Outcome: Progressing   Problem: Elimination: Goal: Will not experience complications related to bowel motility Outcome: Progressing Goal: Will not experience complications related to urinary retention Outcome: Progressing   Problem: Pain Managment: Goal: General experience of comfort will improve and/or be controlled Outcome: Progressing   Problem: Safety: Goal: Ability to remain free from  injury will improve Outcome: Progressing   Problem: Skin Integrity: Goal: Risk for impaired skin integrity will decrease Outcome: Progressing   Problem: Education: Goal: Ability to describe self-care measures that may prevent or decrease complications (Diabetes Survival Skills Education) will improve Outcome: Progressing Goal: Individualized Educational Video(s) Outcome: Progressing   Problem: Coping: Goal: Ability to adjust to condition or change in health will improve Outcome: Progressing   Problem: Fluid Volume: Goal: Ability to maintain a balanced intake and output will improve Outcome: Progressing   Problem: Health Behavior/Discharge Planning: Goal: Ability to identify and utilize available resources and services will improve Outcome: Progressing Goal: Ability to manage health-related needs will improve Outcome: Progressing   Problem: Metabolic: Goal: Ability to maintain appropriate glucose levels will improve Outcome: Progressing   Problem: Nutritional: Goal: Maintenance of adequate nutrition will improve Outcome: Progressing Goal: Progress toward achieving an optimal weight will improve Outcome: Progressing   Problem: Skin Integrity: Goal: Risk for impaired skin integrity will decrease Outcome: Progressing   Problem: Tissue Perfusion: Goal: Adequacy of tissue perfusion will improve Outcome: Progressing

## 2023-09-13 NOTE — Progress Notes (Signed)
 CC: 4 Days Post-Op s/p debridement of complex perineal abscess  necrotizing infection  Subjective: Wound cultures with Staph haemolyticus and Strep constellatus  Cultures  Staph haemolyticus MRSH ( sens vanc, and TMT) and Strep constellatus ( pansensitive) Doing very well w a dramatic improvement in clinical status as compared to the day I saw him   Objective: Vital signs in last 24 hours: Temp:  [97.7 F (36.5 C)-99 F (37.2 C)] 98.2 F (36.8 C) (03/29 0738) Pulse Rate:  [73-91] 91 (03/29 0738) Resp:  [16-20] 19 (03/29 0738) BP: (137-186)/(80-92) 186/92 (03/29 0831) SpO2:  [91 %-100 %] 100 % (03/29 0738) Last BM Date : 09/13/23  Intake/Output from previous day: 03/28 0701 - 03/29 0700 In: 792 [P.O.:792] Out: 2900 [Urine:2900] Intake/Output this shift: No intake/output data recorded.  Physical exam:  NAD in good spirits, conversant Perineum healing well, no further necrotizing infection  Lab Results: CBC  Recent Labs    09/12/23 0418 09/13/23 0345  WBC 12.0* 11.8*  HGB 11.9* 12.6*  HCT 36.8* 37.1*  PLT 194 224   BMET Recent Labs    09/12/23 0418 09/13/23 0345  NA 140 138  K 5.8* 4.8  CL 105 101  CO2 26 27  GLUCOSE 156* 146*  BUN 23 31*  CREATININE 2.00* 1.96*  CALCIUM 9.2 9.3   PT/INR No results for input(s): "LABPROT", "INR" in the last 72 hours. ABG No results for input(s): "PHART", "HCO3" in the last 72 hours.  Invalid input(s): "PCO2", "PO2"  Studies/Results: No results found.  Anti-infectives: Anti-infectives (From admission, onward)    Start     Dose/Rate Route Frequency Ordered Stop   09/11/23 1800  vancomycin (VANCOREADY) IVPB 1500 mg/300 mL        1,500 mg 150 mL/hr over 120 Minutes Intravenous Every 24 hours 09/11/23 0837     09/10/23 1800  vancomycin (VANCOREADY) IVPB 1750 mg/350 mL  Status:  Discontinued        1,750 mg 175 mL/hr over 120 Minutes Intravenous Every 24 hours 09/09/23 1621 09/10/23 0853   09/10/23 1800  vancomycin  (VANCOREADY) IVPB 1250 mg/250 mL  Status:  Discontinued        1,250 mg 166.7 mL/hr over 90 Minutes Intravenous Every 24 hours 09/10/23 0853 09/11/23 0837   09/09/23 2200  piperacillin-tazobactam (ZOSYN) IVPB 3.375 g        3.375 g 12.5 mL/hr over 240 Minutes Intravenous Every 8 hours 09/09/23 1610     09/09/23 1900  vancomycin (VANCOREADY) IVPB 500 mg/100 mL        500 mg 100 mL/hr over 60 Minutes Intravenous  Once 09/09/23 1710 09/09/23 1905   09/09/23 1700  vancomycin (VANCOREADY) IVPB 2000 mg/400 mL        2,000 mg 200 mL/hr over 120 Minutes Intravenous  Once 09/09/23 1608 09/09/23 1854   09/09/23 1545  piperacillin-tazobactam (ZOSYN) IVPB 3.375 g        3.375 g 100 mL/hr over 30 Minutes Intravenous  Once 09/09/23 1544 09/09/23 1905   09/09/23 1445  cefoTEtan (CEFOTAN) 2 g in sodium chloride 0.9 % 100 mL IVPB        2 g 200 mL/hr over 30 Minutes Intravenous  Once 09/09/23 1433 09/09/23 1905   09/07/23 1800  cefTRIAXone (ROCEPHIN) 2 g in sodium chloride 0.9 % 100 mL IVPB  Status:  Discontinued        2 g 200 mL/hr over 30 Minutes Intravenous Every 24 hours 09/06/23 1926 09/09/23 1629   09/07/23  1300  trimethoprim (TRIMPEX) tablet 100 mg        100 mg Oral Daily 09/07/23 1208     09/07/23 0700  metroNIDAZOLE (FLAGYL) IVPB 500 mg  Status:  Discontinued        500 mg 100 mL/hr over 60 Minutes Intravenous Every 12 hours 09/06/23 1926 09/09/23 1629   09/06/23 1600  ceFEPIme (MAXIPIME) 2 g in sodium chloride 0.9 % 100 mL IVPB        2 g 200 mL/hr over 30 Minutes Intravenous  Once 09/06/23 1551 09/06/23 1628   09/06/23 1600  metroNIDAZOLE (FLAGYL) IVPB 500 mg        500 mg 100 mL/hr over 60 Minutes Intravenous  Once 09/06/23 1551 09/06/23 1818       Assessment/Plan:  Doing well responded to debridement, he may be DC from our perspective NO furtther surgical interventions We will be available  Sterling Big, MD, FACS  09/13/2023

## 2023-09-13 NOTE — Progress Notes (Signed)
 Progress Note   Patient: Devin White VWU:[id number] DOB: [birth year] DOA: 09/06/2023     7 DOS: the patient was seen and examined on 09/13/2023   Brief hospital course: 68yo with h/o cirrhosis s/p liver transplant (2009) on immunosuppression, chronic HFpEF, GERD, BPH, HTN, CVA, stage 3a CKD, and gout who presented on 3/22 with presyncope.  He was determined to have sepsis due to perirectal abscess and necrotizing soft tissue infection of the perineum and underwent I&D and wide debridement on 3/25.    Assessment and Plan:  Sepsis due to suspected Fornier's gangrene Patient has a history of C. difficile colitis, most recently approximately 2 years ago No recent exposure to antibiotics however the patient is status post liver transplant on chronic immunosuppression Perirectal abscess, general surgery consulted Gas noted on CT of the pelvis on 3/25 concerning for necrotizing infection Underwent urgent surgical exploration 3/25 Continue IV antibiotics (Zosyn, Vanc) Surgery still following, will reassess tomorrow for possible further debridement BID wet-to-dry dressing changes with Dakins Wound cultures with Staph haemolyticus (sensitive to Clindamycin, Rifampin, Bactrim, Vanc) and Strep constellatus (sensitive to Ceftriaxone, Erythromycin, Levofloxacin, PCN, and Vancomycin); still holding for possible anaerobe For now, will continue Vanc/Zosyn; if negative for anaerobes, can transition to PO Zyvox for duration of treatment Possible dc to home once wound cultures are finalized (1-2 more days?)   Acute kidney injury superimposed on stage 3a chronic kidney disease Baseline creatinine 1.6, GFR 45-50 Presenting creatinine 2.67, GFR 26 Slowly improving, now 1.96, 37 Avoid nonessential nephrotoxins Continue to hold lisinopril   Type 2 diabetes mellitus with peripheral neuropathy Last A1c was 10.6, very poor control Hold metformin, glipizide Hold long-acting insulin for now   Sliding scale  coverage Carb modified diet Continue gabapentin, nortriptyline   Essential hypertension Continue amlodipine Hold lisinopril   Dyslipidemia Continue rosuvastatin   Gout No evidence of exacerbation Hold colchicine   Status post liver transplant/Chronic immunosuppression Tacrolimus, prednisone continued on admission Cellcept resumed (confirmed that this was ok with surgery) on 3/28 Continue trimethoprim for prophylaxis   BPH Continue tamsulosin   Class 2 Obesity Body mass index is 35.08 kg/m.Marland Kitchen  Weight loss should be encouraged Outpatient PCP/bariatric medicine f/u encouraged    Ambulatory dysfunction Recommended for SNF but he prefers home with home health He will need PT/OT and HHN for wound management His granddaughter (a former Museum/gallery conservator) will also be able to help with dressing changes Possible dc in the next few days         Consultants: Surgery Nephrology PT OT   Procedures: I&D of complex perineal abscess with wide debridement, 3/25   Antibiotics: Cefepime x 1 Cefotetan x 1 Ceftriaxone x 2  Zosyn 3/25- Vancomycin 3/25- 30 Day Unplanned Readmission Risk Score    Flowsheet Row ED to Hosp-Admission (Current) from 09/06/2023 in [facility] 1C MEDICAL TELEMETRY  30 Day Unplanned Readmission Risk Score (%) 23.11 Filed at 09/13/2023 0400       This score is the patient's risk of an unplanned readmission within 30 days of being discharged (0 -100%). The score is based on dignosis, age, lab data, medications, orders, and past utilization.   Low:  0-14.9   Medium: 15-21.9   High: 22-29.9   Extreme: 30 and above           Subjective: Continues to feel better, move better.  Excited to transition to once daily (and prn BM) dressing changes today.  Has had 2 BMs.   Objective: Vitals:  09/13/23 0738 09/13/23 0831  BP: (!) 186/92 (!) 186/92  Pulse: 91   Resp: 19   Temp: 98.2 F (36.8 C)   SpO2: 100%     Intake/Output Summary  (Last 24 hours) at 09/13/2023 1113 Last data filed at 09/13/2023 0900 Gross per 24 hour  Intake 960 ml  Output 2150 ml  Net -1190 ml   Filed Weights   09/06/23 1425 09/06/23 2014  Weight: 117.9 kg 120.6 kg    Exam:  General:  Appears calm and comfortable and is in NAD Eyes:   EOMI, normal lids, iris ENT:  grossly normal hearing, lips & tongue, mmm Cardiovascular:  RRR, no m/r/g. No LE edema.  Respiratory:   CTA bilaterally with no wheezes/rales/rhonchi.  Normal respiratory effort. Abdomen:  soft, NT, ND Skin:  no rash or induration seen on limited exam Musculoskeletal:  grossly normal tone BUE/BLE, good ROM, no bony abnormality Psychiatric:  grossly normal mood and affect, speech fluent and appropriate, AOx3 Neurologic:  CN 2-12 grossly intact, moves all extremities in coordinated fashion  Data Reviewed: I have reviewed the patient's lab results since admission.  Pertinent labs for today include:   Glucose 146 BUN 31/Creatinine 1.96/GFR 37, improving WBC 11.8 Hgb 12.6    Family Communication: None present  Disposition: Status is: Inpatient Remains inpatient appropriate because: ongoing management     Time spent: 50 minutes  Unresulted Labs (From admission, onward)     Start     Ordered   09/13/23 0500  CBC with Differential/Platelet  Daily,   R     Question:  Specimen collection method  Answer:  Lab=Lab collect   09/12/23 1349   09/13/23 0500  Basic metabolic panel  Daily,   R     Question:  Specimen collection method  Answer:  Lab=Lab collect   09/12/23 1349             Author: Jonah Blue, MD 09/13/2023 11:13 AM  For on call review [email or web address].

## 2023-09-13 NOTE — TOC Progression Note (Addendum)
 Transition of Care [facility]) - Progression Note    Patient Details  Name: Devin White MRN: [id number] Date of Birth: Aug 09, 1955  Transition [facility] CM/SW Contact  Liliana Cline, LCSW Phone Number: 09/13/2023, 11:37 AM  Clinical Narrative:    CSW met with patient at bedside. Patient states he does not want SNF for STR. He wants to return home. He states he lives in a senior apartment complex and feels safe returning there, and also has support from his daughter and granddaughter. Patient is agreeable to Home Health. No agency preferences. Had [facility] in the past. CSW called Bjorn Loser with [facility] - she is checking if they can take the patient.  Per [facility] handoff, patient also needs a rollator ordered - patient states he does want a rollator if covered by insurance. Referral made to Ada with [employer] and asked her to follow up with patient on any out of pocket costs before delivery. Patient states his daughter or granddaughter will drive him home at time of discharge.  11:43- Rhonda with [facility] is unable to accept the patient.  Called Shaun with [facility] - he is able to accept patient for PT OT.  3:20- Patient decided not to get rollator since insurance will not cover it since he got one in 2023 per Ada.   Expected Discharge Plan: Home w Home Health Services Barriers to Discharge: Continued Medical Work up  Expected Discharge Plan and Services   Discharge Planning Services: CM Consult   Living arrangements for the past 2 months: Single Family Home                                       Social Determinants of Health (SDOH) Interventions SDOH Screenings   Food Insecurity: No Food Insecurity (09/06/2023)  Housing: Low Risk  (09/07/2023)  Recent Concern: Housing - High Risk (07/28/2023)  Transportation Needs: No Transportation Needs (09/06/2023)  Utilities: Not At Risk (09/06/2023)  Depression (PHQ2-9): Low Risk  (02/27/2023)  Financial Resource Strain: Low Risk   (07/28/2023)  Physical Activity: Insufficiently Active (07/28/2023)  Social Connections: Socially Isolated (09/06/2023)  Stress: No Stress Concern Present (07/28/2023)  Tobacco Use: Low Risk  (09/09/2023)    Readmission Risk Interventions    11/09/2022    1:38 PM 01/31/2022    9:58 AM 11/06/2021   11:09 AM  Readmission Risk Prevention Plan  Transportation Screening Complete Complete Complete  PCP or Specialist Appt within 5-7 Days Complete  Complete  PCP or Specialist Appt within 3-5 Days  Complete   Home Care Screening Complete  Complete  Medication Review (RN CM) Referral to Pharmacy  Referral to Pharmacy  Social Work Consult for Recovery Care Planning/Counseling  Complete   Palliative Care Screening  Not Applicable   Medication Review Ocean[employer])  Complete

## 2023-09-13 NOTE — Discharge Instructions (Signed)
 Wound Packing Wound packing usually involves placing a moistened packing material into your wound and then covering it with an outer bandage (dressing). This helps support the healing of deep tissue and tissue under the skin. It also helps prevent bleeding, infection, and further injury. Wounds are packed until deep tissues heal. The time it takes for this to happen is different for everyone. Your health care provider will show you how to pack and dress your wound. Using gloves and a clean technique is important to avoid spreading germs into your wound. Supplies needed: Soap and water. Disposable gloves. Cleansing or wetting solution, such as saline, germ-free (sterile) water, or an antiseptic solution. Clean bowl. Clean packing material, such as gauze, gauze sponges, or rolled gauze. Clean paper towels. Outer dressing. This includes the cover dressing and tape, or a dressing with an adhesive border. Cotton-tipped swabs. Small plastic bag for trash. How to pack your wound Follow your health care provider's instructions on how often you need to change dressings and pack your wound. You will likely be asked to change your dressings 1 to 2 times a day. Preparing to change the wound packing If needed, take pain medicine 30 minutes before you pack your wound as told by your health care provider. Preparing the new packing material  Clean and disinfect your work surface or countertop. Set a plastic bag on or near your work surface. Wash your hands with soap and water for at least 20 seconds before you change the dressing. If soap and water are not available, use hand sanitizer. Put a clean paper towel on the counter. Put a clean bowl on the towel. Only touch the outside of the bowl when handling it. Pour the cleansing or wetting solution that your health care provider tells you to use into the bowl. Select and cut your packing material to fit the size of your wound. Avoid using multiple pieces of  packing material. Drop it into the bowl. Cut tape strips that you will use to seal the outer dressing, if needed. Put gauze pads for cleansing and cotton-tipped swabs on the clean paper towel. Removing the old packing material and dressing Put on a set of gloves. Gently remove the old dressing and packing material. Make sure to check how the drainage looks or if there is any odor. Clean or rinse (irrigate) the wound. Remove your gloves. Put the removed items, including gloves, into the plastic bag to throw away later. Wash your hands again with soap and water for at least 20 seconds. If soap and water are not available, use hand sanitizer. Applying the new packing material and dressing  Put on a new set of gloves. Squeeze the packing material in the bowl to release the extra liquid. The packing material should be moist, but not dripping wet. Gently place the packing material into the wound. Use a cotton-tipped swab to guide it into place, filling all of the space. Do not overpack the wound bed. Dry your gloved fingertips on the paper towel. Open up your outer dressing supplies and put them on a dry part of the paper towel. Keep them from getting wet. Place the outer dressing over the packed wound. Tape the edges of the outer dressing in place. Remove your gloves. Wash your hands again with soap and water for at least 20 seconds. If soap and water are not available, use hand sanitizer. Put the removed items, including gloves, into the plastic bag to throw away. Clean and disinfect your work  surface or countertop. General tips Follow your health care provider's instructions on how much to pack the wound. At first, you may need to pack it more fully to help stop bleeding. As the wound begins to heal inside, you will use less packing material and pack the wound loosely to allow the tissue to heal slowly from the inside out. Do not take baths, swim, or use a hot tub until your health care  provider approves. Ask your health care provider if you may take showers. You may only be allowed to take sponge baths. Keep the dressing clean and dry. Follow any other instructions given by your health care provider on how to aid healing. This may include applying warm or cold compresses, raising (elevating) the affected area, or wearing a compression dressing. Check your wound site every day for signs of infection. Check for: More redness, swelling, or pain. More fluid or blood. Warmth or hardness (induration). Pus or a bad smell. Protect your wound from the sun when you are outside for the first 6 months, or for as long as told by your health care provider. Cover up the scar area or apply sunscreen that has an SPF of at least 30. Keep all follow-up visits. This is important. Contact a health care provider if: Your pain is not controlled with pain medicine. You have more drainage, redness, swelling, or pain at your wound site. You have new rash, warmth, or induration around the wound. You have a fever or chills. Your wound becomes larger or deeper. Get help right away if: The tissue inside your wound changes color from pink to white, yellow, or black. You notice a bad smell or pus coming from the wound site. You are having trouble packing your wound. Your wound is bleeding, and the bleeding does not stop with gentle pressure. These symptoms may represent a serious problem that is an emergency. Do not wait to see if the symptoms will go away. Get medical help right away. Call your local emergency services (911 in the [place] Do not drive yourself to the hospital. Summary Wound packing usually involves placing a moistened packing material into your wound and then covering it with an outer bandage (dressing). Follow your health care provider's instructions on how often you need to change dressings and pack your wound. You will likely be asked to change dressings 1 to 2 times a day. When  packing your wound, it is important to use gloves to avoid spreading germs into the wound. Check your wound site every day for signs of infection. This information is not intended to replace advice given to you by your health care provider. Make sure you discuss any questions you have with your health care provider. Document Revised: 10/10/2020 Document Reviewed: 10/10/2020 Elsevier Patient Education  2024 [employer].

## 2023-09-13 NOTE — Plan of Care (Signed)

## 2023-09-13 NOTE — Progress Notes (Addendum)
 [facility]  ROUNDING NOTE   Subjective:   Patient seen laying in bed Alert and oriented No family present Appetite remains appropriate Room air   Objective:  Vital signs in last 24 hours:  Temp:  [97.7 F (36.5 C)-98.2 F (36.8 C)] 98 F (36.7 C) (03/29 1127) Pulse Rate:  [73-91] 81 (03/29 1127) Resp:  [18-20] 18 (03/29 1127) BP: (137-186)/(80-92) 143/89 (03/29 1127) SpO2:  [91 %-100 %] 94 % (03/29 1127)  Weight change:  Filed Weights   09/06/23 1425 09/06/23 2014  Weight: 117.9 kg 120.6 kg    Intake/Output: I/O last 3 completed shifts: In: 792 [P.O.:792] Out: 2900 [Urine:2900]   Intake/Output this shift:  Total I/O In: 240 [P.O.:240] Out: -   Physical Exam: General: NAD  Head: Normocephalic, atraumatic. Moist oral mucosal membranes  Eyes: Anicteric  Lungs:  Clear to auscultation, normal effort  Heart: Regular rate and rhythm  Abdomen:  Soft, nontender  Extremities:  No peripheral edema.  Neurologic: Alert and oriented, moving all four extremities  Skin: No lesions, perineal abscess wound   Basic Metabolic Panel: Recent Labs  Lab 09/09/23 0720 09/10/23 0413 09/10/23 0840 09/11/23 0423 09/12/23 0418 09/13/23 0345  NA 140  --  137 140 140 138  K 3.9  --  4.4 4.1 5.8* 4.8  CL 108  --  104 103 105 101  CO2 25  --  24 23 26 27   GLUCOSE 105*  --  157* 133* 156* 146*  BUN 21  --  21 21 23  31*  CREATININE 2.11* 2.44* 2.36* 2.21* 2.00* 1.96*  CALCIUM 8.5*  --  8.1* 8.6* 9.2 9.3    Liver Function Tests: Recent Labs  Lab 09/10/23 0840  AST 17  ALT 21  ALKPHOS 50  BILITOT 1.2  PROT 6.1*  ALBUMIN 2.7*   No results for input(s): "LIPASE", "AMYLASE" in the last 168 hours.  No results for input(s): "AMMONIA" in the last 168 hours.  CBC: Recent Labs  Lab 09/09/23 0720 09/10/23 0840 09/11/23 0423 09/12/23 0418 09/13/23 0345  WBC 9.9 11.6* 10.1 12.0* 11.8*  NEUTROABS  --  8.8* 7.4 9.0* 8.0*  HGB 11.8* 11.4* 12.2* 11.9* 12.6*   HCT 35.4* 34.0* 36.6* 36.8* 37.1*  MCV 93.7 93.9 95.8 96.8 95.6  PLT 120* 147* 163 194 224    Cardiac Enzymes: No results for input(s): "CKTOTAL", "CKMB", "CKMBINDEX", "TROPONINI" in the last 168 hours.  BNP: Invalid input(s): "POCBNP"  CBG: Recent Labs  Lab 09/12/23 1139 09/12/23 1559 09/12/23 2010 09/13/23 0734 09/13/23 1156  GLUCAP 149* 183* 206* 112* 156*    Microbiology: Results for orders placed or performed during the hospital encounter of 09/06/23  Blood Culture (routine x 2)     Status: None   Collection Time: 09/06/23  3:53 PM   Specimen: BLOOD RIGHT ARM  Result Value Ref Range Status   Specimen Description BLOOD RIGHT ARM  Final   Special Requests   Final    BOTTLES DRAWN AEROBIC AND ANAEROBIC Blood Culture results may not be optimal due to an inadequate volume of blood received in culture bottles   Culture   Final    NO GROWTH 5 DAYS Performed at [facility], [address], [place], [place] [address]    Report Status 09/11/2023 FINAL  Final  Blood Culture (routine x 2)     Status: None   Collection Time: 09/06/23  3:53 PM   Specimen: BLOOD  Result Value Ref Range Status  Specimen Description BLOOD LEFT ANTECUBITAL  Final   Special Requests   Final    BOTTLES DRAWN AEROBIC AND ANAEROBIC Blood Culture adequate volume   Culture   Final    NO GROWTH 5 DAYS Performed at [facility], [address] [place], [place] [address]    Report Status 09/11/2023 FINAL  Final  Resp panel by RT-PCR (RSV, Flu A&B, Covid) Anterior Nasal Swab     Status: None   Collection Time: 09/06/23  5:01 PM   Specimen: Anterior Nasal Swab  Result Value Ref Range Status   SARS Coronavirus 2 by RT PCR NEGATIVE NEGATIVE Final    Comment: (NOTE) SARS-CoV-2 target nucleic acids are NOT DETECTED.  The SARS-CoV-2 RNA is generally detectable in upper respiratory specimens during the acute phase of infection. The lowest concentration of SARS-CoV-2 viral copies  this assay can detect is 138 copies/mL. A negative result does not preclude SARS-Cov-2 infection and should not be used as the sole basis for treatment or other patient management decisions. A negative result may occur with  improper specimen collection/handling, submission of specimen other than nasopharyngeal swab, presence of viral mutation(s) within the areas targeted by this assay, and inadequate number of viral copies(<138 copies/mL). A negative result must be combined with clinical observations, patient history, and epidemiological information. The expected result is Negative.  Fact Sheet for Patients:  BloggerCourse.com  Fact Sheet for Healthcare Providers:  SeriousBroker.it  This test is no t yet approved or cleared by the [place] FDA and  has been authorized for detection and/or diagnosis of SARS-CoV-2 by FDA under an Emergency Use Authorization (EUA). This EUA will remain  in effect (meaning this test can be used) for the duration of the COVID-19 declaration under Section 564(b)(1) of the Act, 21 U.S.C.section 360bbb-3(b)(1), unless the authorization is terminated  or revoked sooner.       Influenza A by PCR NEGATIVE NEGATIVE Final   Influenza B by PCR NEGATIVE NEGATIVE Final    Comment: (NOTE) The Xpert Xpress SARS-CoV-2/FLU/RSV plus assay is intended as an aid in the diagnosis of influenza from Nasopharyngeal swab specimens and should not be used as a sole basis for treatment. Nasal washings and aspirates are unacceptable for Xpert Xpress SARS-CoV-2/FLU/RSV testing.  Fact Sheet for Patients: BloggerCourse.com  Fact Sheet for Healthcare Providers: SeriousBroker.it  This test is not yet approved or cleared by the [place] FDA and has been authorized for detection and/or diagnosis of SARS-CoV-2 by FDA under an Emergency Use Authorization (EUA). This EUA  will remain in effect (meaning this test can be used) for the duration of the COVID-19 declaration under Section 564(b)(1) of the Act, 21 U.S.C. section 360bbb-3(b)(1), unless the authorization is terminated or revoked.     Resp Syncytial Virus by PCR NEGATIVE NEGATIVE Final    Comment: (NOTE) Fact Sheet for Patients: BloggerCourse.com  Fact Sheet for Healthcare Providers: SeriousBroker.it  This test is not yet approved or cleared by the [place] FDA and has been authorized for detection and/or diagnosis of SARS-CoV-2 by FDA under an Emergency Use Authorization (EUA). This EUA will remain in effect (meaning this test can be used) for the duration of the COVID-19 declaration under Section 564(b)(1) of the Act, 21 U.S.C. section 360bbb-3(b)(1), unless the authorization is terminated or revoked.  Performed at [facility], [address] [place], [place] [address]   Aerobic/Anaerobic Culture w Gram Stain (surgical/deep wound)     Status: None (Preliminary result)   Collection Time:  09/09/23  3:19 PM   Specimen: Abscess  Result Value Ref Range Status   Specimen Description   Final    ABSCESS Performed at [facility], [address] [place], [place] [address]    Special Requests PERINEAL INFECTION 1 SPEC A  Final   Gram Stain   Final    RARE WBC PRESENT, PREDOMINANTLY PMN FEW GRAM POSITIVE COCCI RARE GRAM NEGATIVE RODS    Culture   Final    RARE STAPHYLOCOCCUS HAEMOLYTICUS FEW STREPTOCOCCUS CONSTELLATUS FEW PREVOTELLA SPECIES BETA LACTAMASE POSITIVE Performed at [facility], [address], [place], [place] [address]    Report Status PENDING  Incomplete   Organism ID, Bacteria STAPHYLOCOCCUS HAEMOLYTICUS  Final   Organism ID, Bacteria STREPTOCOCCUS CONSTELLATUS  Final      Susceptibility   Streptococcus constellatus - MIC*    PENICILLIN <=0.06 SENSITIVE Sensitive     CEFTRIAXONE <=0.12  SENSITIVE Sensitive     ERYTHROMYCIN <=0.12 SENSITIVE Sensitive     LEVOFLOXACIN <=0.25 SENSITIVE Sensitive     VANCOMYCIN 0.25 SENSITIVE Sensitive     * FEW STREPTOCOCCUS CONSTELLATUS   Staphylococcus haemolyticus - MIC*    CIPROFLOXACIN 2 INTERMEDIATE Intermediate     ERYTHROMYCIN >=8 RESISTANT Resistant     GENTAMICIN >=16 RESISTANT Resistant     OXACILLIN >=4 RESISTANT Resistant     TETRACYCLINE >=16 RESISTANT Resistant     VANCOMYCIN 1 SENSITIVE Sensitive     TRIMETH/SULFA <=10 SENSITIVE Sensitive     CLINDAMYCIN >=8 RESISTANT Resistant     RIFAMPIN <=0.5 SENSITIVE Sensitive     Inducible Clindamycin NEGATIVE Sensitive     * RARE STAPHYLOCOCCUS HAEMOLYTICUS  Aerobic/Anaerobic Culture w Gram Stain (surgical/deep wound)     Status: None (Preliminary result)   Collection Time: 09/09/23  3:27 PM   Specimen: Abscess  Result Value Ref Range Status   Specimen Description   Final    ABSCESS Performed at [facility], [address] [place] [address], [place], [place] [address]    Special Requests PERINEAL INFECTION 2 SPEC B  Final   Gram Stain   Final    RARE WBC PRESENT, PREDOMINANTLY PMN FEW GRAM POSITIVE COCCI RARE GRAM NEGATIVE RODS    Culture   Final    FEW STREPTOCOCCUS CONSTELLATUS SUSCEPTIBILITIES PERFORMED ON PREVIOUS CULTURE WITHIN THE LAST 5 DAYS. FEW PREVOTELLA SPECIES BETA LACTAMASE POSITIVE Performed at [facility], [address], [place], [place] [address]    Report Status PENDING  Incomplete  Aerobic/Anaerobic Culture w Gram Stain (surgical/deep wound)     Status: None (Preliminary result)   Collection Time: 09/09/23  3:27 PM   Specimen: Abscess  Result Value Ref Range Status   Specimen Description   Final    ABSCESS Performed at [facility], [address] [place], [place] [address]    Special Requests PERINEAL INFECTION 3 SPEC C  Final   Gram Stain   Final    RARE WBC PRESENT, PREDOMINANTLY PMN RARE GRAM POSITIVE COCCI    Culture   Final     FEW STREPTOCOCCUS CONSTELLATUS SUSCEPTIBILITIES PERFORMED ON PREVIOUS CULTURE WITHIN THE LAST 5 DAYS. RARE PREVOTELLA SPECIES BETA LACTAMASE POSITIVE Performed at [facility], [address] [place], [place] [address]    Report Status PENDING  Incomplete    Coagulation Studies: No results for input(s): "LABPROT", "INR" in the last 72 hours.   Urinalysis: No results for input(s): "COLORURINE", "LABSPEC", "PHURINE", "GLUCOSEU", "HGBUR", "BILIRUBINUR", "KETONESUR", "PROTEINUR", "UROBILINOGEN", "NITRITE", "  LEUKOCYTESUR" in the last 72 hours.  Invalid input(s): "APPERANCEUR"     Imaging: No results found.     Medications:    piperacillin-tazobactam (ZOSYN)  IV 3.375 g (09/13/23 1417)   vancomycin 1,500 mg (09/12/23 1811)    amLODipine  10 mg Oral Daily   aspirin EC  81 mg Oral Daily   enoxaparin (LOVENOX) injection  0.5 mg/kg Subcutaneous Q24H   feeding supplement  1 Container Oral TID BM   gabapentin  300 mg Oral TID   insulin aspart  0-15 Units Subcutaneous TID WC   insulin aspart  0-5 Units Subcutaneous QHS   methocarbamol  500 mg Oral BID   mycophenolate  250 mg Oral QPM   nortriptyline  10 mg Oral BID   pantoprazole  40 mg Oral Daily   predniSONE  10 mg Oral Q breakfast   rosuvastatin  20 mg Oral Daily   senna-docusate  1 tablet Oral BID   tacrolimus  0.5 mg Oral q morning   tacrolimus  1 mg Oral QHS   tamsulosin  0.8 mg Oral Daily   trimethoprim  100 mg Oral Daily   acetaminophen **OR** acetaminophen, calcium carbonate, HYDROmorphone (DILAUDID) injection, magnesium hydroxide, menthol-cetylpyridinium, ondansetron **OR** ondansetron (ZOFRAN) IV, oxyCODONE, traZODone  Assessment/ Plan:  [name] is a 68 y.o.  male with a PMHx of hypertension, coronary artery disease, congestive heart failure, diabetes, BPH, history of CVA, history of chronic kidney disease stage IIIa and a history of liver cirrhosis s/p transplant in 2009.  Patient now being  admitted with history of diarrhea, abdominal pain and syncope/presyncopal episodes.  Patient required CT scan of the abdomen and pelvis with IV contrast to rule out any acute abdominal process. He has been on mycophenolate, tacrolimus and prednisone. He is also on Bactrim, furosemide and ACE inhibitor's.  Acute kidney injury on chronic kidney disease 3a: Patient has baseline creatinine 1.6/FR 48.  This may be secondary to prerenal azotemia. Patient also received IV contrast.  Creatinine slowly improving to baseline.  No acute indication for dialysis.  Patient encouraged to maintain oral intake.  Outpatient follow-up in our office scheduled for next week.  Lab Results  Component Value Date   CREATININE 1.96 (H) 09/13/2023   CREATININE 2.00 (H) 09/12/2023   CREATININE 2.21 (H) 09/11/2023    Intake/Output Summary (Last 24 hours) at 09/13/2023 1504 Last data filed at 09/13/2023 0900 Gross per 24 hour  Intake 720 ml  Output 1450 ml  Net -730 ml    #2:  s/p debridement of complex perineal abscess  necrotizing infection:  Continue present medications including Zosyn, trimethoprim, vancomycin.   General surgery performed I&D 09/09/23  Surgery managing wound dressing changes.    #3: History of liver transplant:  - currently on MMF, prednisone and tacrolimus - will obtain tacrolimus level on monday   #4: Hypertension: Remains on amlodipine.  Lisinopril held   Blood pressure acceptable.   Creatinine is trending lower, will be abe to start lisinopril tomorrow.   #5 Hyperkalemia, corrected to 4.8 with Lokelma.    LOS: 7 Shantelle Breeze 3/29/20253:04 PM

## 2023-09-14 DIAGNOSIS — A419 Sepsis, unspecified organism: Secondary | ICD-10-CM | POA: Diagnosis not present

## 2023-09-14 LAB — CBC WITH DIFFERENTIAL/PLATELET
Abs Immature Granulocytes: 1.43 10*3/uL — ABNORMAL HIGH (ref 0.00–0.07)
Basophils Absolute: 0.1 10*3/uL (ref 0.0–0.1)
Basophils Relative: 1 %
Eosinophils Absolute: 0.3 10*3/uL (ref 0.0–0.5)
Eosinophils Relative: 2 %
HCT: 38.3 % — ABNORMAL LOW (ref 39.0–52.0)
Hemoglobin: 12.7 g/dL — ABNORMAL LOW (ref 13.0–17.0)
Immature Granulocytes: 10 %
Lymphocytes Relative: 15 %
Lymphs Abs: 2 10*3/uL (ref 0.7–4.0)
MCH: 31.8 pg (ref 26.0–34.0)
MCHC: 33.2 g/dL (ref 30.0–36.0)
MCV: 95.8 fL (ref 80.0–100.0)
Monocytes Absolute: 1.1 10*3/uL — ABNORMAL HIGH (ref 0.1–1.0)
Monocytes Relative: 8 %
Neutro Abs: 8.8 10*3/uL — ABNORMAL HIGH (ref 1.7–7.7)
Neutrophils Relative %: 64 %
Platelets: 253 10*3/uL (ref 150–400)
RBC: 4 MIL/uL — ABNORMAL LOW (ref 4.22–5.81)
RDW: 14 % (ref 11.5–15.5)
Smear Review: NORMAL
WBC: 13.8 10*3/uL — ABNORMAL HIGH (ref 4.0–10.5)
nRBC: 0.4 % — ABNORMAL HIGH (ref 0.0–0.2)

## 2023-09-14 LAB — GLUCOSE, CAPILLARY
Glucose-Capillary: 132 mg/dL — ABNORMAL HIGH (ref 70–99)
Glucose-Capillary: 122 mg/dL — ABNORMAL HIGH (ref 70–99)
Glucose-Capillary: 173 mg/dL — ABNORMAL HIGH (ref 70–99)

## 2023-09-14 LAB — BASIC METABOLIC PANEL WITH GFR
Anion gap: 11 (ref 5–15)
BUN: 33 mg/dL — ABNORMAL HIGH (ref 8–23)
CO2: 27 mmol/L (ref 22–32)
Calcium: 9.4 mg/dL (ref 8.9–10.3)
Chloride: 101 mmol/L (ref 98–111)
Creatinine, Ser: 2.01 mg/dL — ABNORMAL HIGH (ref 0.61–1.24)
GFR, Estimated: 36 mL/min — ABNORMAL LOW (ref >60)
Glucose, Bld: 145 mg/dL — ABNORMAL HIGH (ref 70–99)
Potassium: 5.7 mmol/L — ABNORMAL HIGH (ref 3.5–5.1)
Sodium: 139 mmol/L (ref 135–145)

## 2023-09-14 MED ORDER — SENNOSIDES-DOCUSATE SODIUM 8.6-50 MG PO TABS: 1.0000 | ORAL_TABLET | Freq: Two times a day (BID) | ORAL | 0 refills | Status: AC

## 2023-09-14 MED ORDER — OXYCODONE HCL 5 MG PO TABS
5.0000 mg | ORAL_TABLET | ORAL | 0 refills | Status: DC | PRN
Start: 2023-09-14 — End: 2023-12-25

## 2023-09-14 MED ORDER — LINEZOLID 600 MG PO TABS
600.0000 mg | ORAL_TABLET | Freq: Two times a day (BID) | ORAL | 0 refills | Status: DC
Start: 2023-09-14 — End: 2023-09-22

## 2023-09-14 MED ORDER — AMOXICILLIN-POT CLAVULANATE 875-125 MG PO TABS: 1.0000 | ORAL_TABLET | Freq: Two times a day (BID) | ORAL | 0 refills | Status: DC

## 2023-09-14 MED ORDER — AMOXICILLIN-POT CLAVULANATE 875-125 MG PO TABS
1.0000 | ORAL_TABLET | Freq: Two times a day (BID) | ORAL | Status: DC
  Administered 2023-09-14: 1 via ORAL
  Filled 2023-09-14: qty 1

## 2023-09-14 MED ORDER — SODIUM ZIRCONIUM CYCLOSILICATE 10 G PO PACK: 10.0000 g | PACK | Freq: Every day | ORAL | 0 refills | Status: AC

## 2023-09-14 MED ORDER — SODIUM ZIRCONIUM CYCLOSILICATE 10 G PO PACK
10.0000 g | PACK | Freq: Every day | ORAL | Status: DC
  Administered 2023-09-14: 10 g via ORAL
  Filled 2023-09-14: qty 1

## 2023-09-14 NOTE — Progress Notes (Signed)
 [facility]  ROUNDING NOTE   Subjective:   Patient seen laying in bed Alert and oriented Denies pain Tolerating meals.   Discharge expected today  Creatinine 2.01 UOP 1.2L overnight  Objective:  Vital signs in last 24 hours:  Temp:  [97.6 F (36.4 C)-98 F (36.7 C)] 98 F (36.7 C) (03/30 0829) Pulse Rate:  [70-83] 76 (03/30 0829) Resp:  [18] 18 (03/30 0829) BP: (132-147)/(79-88) 143/82 (03/30 0829) SpO2:  [92 %-95 %] 95 % (03/30 0829)  Weight change:  Filed Weights   09/06/23 1425 09/06/23 2014  Weight: 117.9 kg 120.6 kg    Intake/Output: I/O last 3 completed shifts: In: 960 [P.O.:960] Out: 2000 [Urine:2000]   Intake/Output this shift:  Total I/O In: 0  Out: 1125 [Urine:1125]  Physical Exam: General: NAD  Head: Normocephalic, atraumatic. Moist oral mucosal membranes  Eyes: Anicteric  Lungs:  Clear to auscultation, normal effort  Heart: Regular rate and rhythm  Abdomen:  Soft, nontender  Extremities:  No peripheral edema.  Neurologic: Alert and oriented, moving all four extremities  Skin: No lesions, perineal abscess wound   Basic Metabolic Panel: Recent Labs  Lab 09/10/23 0840 09/11/23 0423 09/12/23 0418 09/13/23 0345 09/14/23 0336  NA 137 140 140 138 139  K 4.4 4.1 5.8* 4.8 5.7*  CL 104 103 105 101 101  CO2 24 23 26 27 27   GLUCOSE 157* 133* 156* 146* 145*  BUN 21 21 23  31* 33*  CREATININE 2.36* 2.21* 2.00* 1.96* 2.01*  CALCIUM 8.1* 8.6* 9.2 9.3 9.4    Liver Function Tests: Recent Labs  Lab 09/10/23 0840  AST 17  ALT 21  ALKPHOS 50  BILITOT 1.2  PROT 6.1*  ALBUMIN 2.7*   No results for input(s): "LIPASE", "AMYLASE" in the last 168 hours.  No results for input(s): "AMMONIA" in the last 168 hours.  CBC: Recent Labs  Lab 09/10/23 0840 09/11/23 0423 09/12/23 0418 09/13/23 0345 09/14/23 0336  WBC 11.6* 10.1 12.0* 11.8* 13.8*  NEUTROABS 8.8* 7.4 9.0* 8.0* 8.8*  HGB 11.4* 12.2* 11.9* 12.6* 12.7*  HCT 34.0* 36.6*  36.8* 37.1* 38.3*  MCV 93.9 95.8 96.8 95.6 95.8  PLT 147* 163 194 224 253    Cardiac Enzymes: No results for input(s): "CKTOTAL", "CKMB", "CKMBINDEX", "TROPONINI" in the last 168 hours.  BNP: Invalid input(s): "POCBNP"  CBG: Recent Labs  Lab 09/13/23 1618 09/13/23 1941 09/14/23 0519 09/14/23 0807 09/14/23 1111  GLUCAP 205* 189* 132* 122* 173*    Microbiology: Results for orders placed or performed during the hospital encounter of 09/06/23  Blood Culture (routine x 2)     Status: None   Collection Time: 09/06/23  3:53 PM   Specimen: BLOOD RIGHT ARM  Result Value Ref Range Status   Specimen Description BLOOD RIGHT ARM  Final   Special Requests   Final    BOTTLES DRAWN AEROBIC AND ANAEROBIC Blood Culture results may not be optimal due to an inadequate volume of blood received in culture bottles   Culture   Final    NO GROWTH 5 DAYS Performed at [facility], [address] [place], [place] [address]    Report Status 09/11/2023 FINAL  Final  Blood Culture (routine x 2)     Status: None   Collection Time: 09/06/23  3:53 PM   Specimen: BLOOD  Result Value Ref Range Status   Specimen Description BLOOD LEFT ANTECUBITAL  Final   Special Requests   Final    BOTTLES DRAWN AEROBIC  AND ANAEROBIC Blood Culture adequate volume   Culture   Final    NO GROWTH 5 DAYS Performed at [facility], [address] [place], [place] [address]    Report Status 09/11/2023 FINAL  Final  Resp panel by RT-PCR (RSV, Flu A&B, Covid) Anterior Nasal Swab     Status: None   Collection Time: 09/06/23  5:01 PM   Specimen: Anterior Nasal Swab  Result Value Ref Range Status   SARS Coronavirus 2 by RT PCR NEGATIVE NEGATIVE Final    Comment: (NOTE) SARS-CoV-2 target nucleic acids are NOT DETECTED.  The SARS-CoV-2 RNA is generally detectable in upper respiratory specimens during the acute phase of infection. The lowest concentration of SARS-CoV-2 viral copies this assay can  detect is 138 copies/mL. A negative result does not preclude SARS-Cov-2 infection and should not be used as the sole basis for treatment or other patient management decisions. A negative result may occur with  improper specimen collection/handling, submission of specimen other than nasopharyngeal swab, presence of viral mutation(s) within the areas targeted by this assay, and inadequate number of viral copies(<138 copies/mL). A negative result must be combined with clinical observations, patient history, and epidemiological information. The expected result is Negative.  Fact Sheet for Patients:  BloggerCourse.com  Fact Sheet for Healthcare Providers:  SeriousBroker.it  This test is no t yet approved or cleared by the [place] FDA and  has been authorized for detection and/or diagnosis of SARS-CoV-2 by FDA under an Emergency Use Authorization (EUA). This EUA will remain  in effect (meaning this test can be used) for the duration of the COVID-19 declaration under Section 564(b)(1) of the Act, 21 U.S.C.section 360bbb-3(b)(1), unless the authorization is terminated  or revoked sooner.       Influenza A by PCR NEGATIVE NEGATIVE Final   Influenza B by PCR NEGATIVE NEGATIVE Final    Comment: (NOTE) The Xpert Xpress SARS-CoV-2/FLU/RSV plus assay is intended as an aid in the diagnosis of influenza from Nasopharyngeal swab specimens and should not be used as a sole basis for treatment. Nasal washings and aspirates are unacceptable for Xpert Xpress SARS-CoV-2/FLU/RSV testing.  Fact Sheet for Patients: BloggerCourse.com  Fact Sheet for Healthcare Providers: SeriousBroker.it  This test is not yet approved or cleared by the [place] FDA and has been authorized for detection and/or diagnosis of SARS-CoV-2 by FDA under an Emergency Use Authorization (EUA). This EUA will remain in  effect (meaning this test can be used) for the duration of the COVID-19 declaration under Section 564(b)(1) of the Act, 21 U.S.C. section 360bbb-3(b)(1), unless the authorization is terminated or revoked.     Resp Syncytial Virus by PCR NEGATIVE NEGATIVE Final    Comment: (NOTE) Fact Sheet for Patients: BloggerCourse.com  Fact Sheet for Healthcare Providers: SeriousBroker.it  This test is not yet approved or cleared by the [place] FDA and has been authorized for detection and/or diagnosis of SARS-CoV-2 by FDA under an Emergency Use Authorization (EUA). This EUA will remain in effect (meaning this test can be used) for the duration of the COVID-19 declaration under Section 564(b)(1) of the Act, 21 U.S.C. section 360bbb-3(b)(1), unless the authorization is terminated or revoked.  Performed at [facility], [address] [place], [place] [address]   Aerobic/Anaerobic Culture w Gram Stain (surgical/deep wound)     Status: None (Preliminary result)   Collection Time: 09/09/23  3:19 PM   Specimen: Abscess  Result Value Ref Range Status   Specimen Description  Final    ABSCESS Performed at [facility], [address] [place], [place] [address]    Special Requests PERINEAL INFECTION 1 SPEC A  Final   Gram Stain   Final    RARE WBC PRESENT, PREDOMINANTLY PMN FEW GRAM POSITIVE COCCI RARE GRAM NEGATIVE RODS    Culture   Final    RARE STAPHYLOCOCCUS HAEMOLYTICUS FEW STREPTOCOCCUS CONSTELLATUS FEW PREVOTELLA SPECIES BETA LACTAMASE POSITIVE Performed at [facility], [address], [place], [place] [address]    Report Status PENDING  Incomplete   Organism ID, Bacteria STAPHYLOCOCCUS HAEMOLYTICUS  Final   Organism ID, Bacteria STREPTOCOCCUS CONSTELLATUS  Final      Susceptibility   Streptococcus constellatus - MIC*    PENICILLIN <=0.06 SENSITIVE Sensitive     CEFTRIAXONE <=0.12 SENSITIVE  Sensitive     ERYTHROMYCIN <=0.12 SENSITIVE Sensitive     LEVOFLOXACIN <=0.25 SENSITIVE Sensitive     VANCOMYCIN 0.25 SENSITIVE Sensitive     * FEW STREPTOCOCCUS CONSTELLATUS   Staphylococcus haemolyticus - MIC*    CIPROFLOXACIN 2 INTERMEDIATE Intermediate     ERYTHROMYCIN >=8 RESISTANT Resistant     GENTAMICIN >=16 RESISTANT Resistant     OXACILLIN >=4 RESISTANT Resistant     TETRACYCLINE >=16 RESISTANT Resistant     VANCOMYCIN 1 SENSITIVE Sensitive     TRIMETH/SULFA <=10 SENSITIVE Sensitive     CLINDAMYCIN >=8 RESISTANT Resistant     RIFAMPIN <=0.5 SENSITIVE Sensitive     Inducible Clindamycin NEGATIVE Sensitive     * RARE STAPHYLOCOCCUS HAEMOLYTICUS  Aerobic/Anaerobic Culture w Gram Stain (surgical/deep wound)     Status: None (Preliminary result)   Collection Time: 09/09/23  3:27 PM   Specimen: Abscess  Result Value Ref Range Status   Specimen Description   Final    ABSCESS Performed at [facility], [address] [place], [place] [address]    Special Requests PERINEAL INFECTION 2 SPEC B  Final   Gram Stain   Final    RARE WBC PRESENT, PREDOMINANTLY PMN FEW GRAM POSITIVE COCCI RARE GRAM NEGATIVE RODS    Culture   Final    FEW STREPTOCOCCUS CONSTELLATUS SUSCEPTIBILITIES PERFORMED ON PREVIOUS CULTURE WITHIN THE LAST 5 DAYS. FEW PREVOTELLA SPECIES BETA LACTAMASE POSITIVE Performed at [facility], [address], [place], [place] [address]    Report Status PENDING  Incomplete  Aerobic/Anaerobic Culture w Gram Stain (surgical/deep wound)     Status: None (Preliminary result)   Collection Time: 09/09/23  3:27 PM   Specimen: Abscess  Result Value Ref Range Status   Specimen Description   Final    ABSCESS Performed at [facility], [address] [place], [place] [address]    Special Requests PERINEAL INFECTION 3 SPEC C  Final   Gram Stain   Final    RARE WBC PRESENT, PREDOMINANTLY PMN RARE GRAM POSITIVE COCCI    Culture   Final    FEW  STREPTOCOCCUS CONSTELLATUS SUSCEPTIBILITIES PERFORMED ON PREVIOUS CULTURE WITHIN THE LAST 5 DAYS. RARE PREVOTELLA SPECIES BETA LACTAMASE POSITIVE Performed at [facility], [address] [place], [place] [address]    Report Status PENDING  Incomplete    Coagulation Studies: No results for input(s): "LABPROT", "INR" in the last 72 hours.   Urinalysis: No results for input(s): "COLORURINE", "LABSPEC", "PHURINE", "GLUCOSEU", "HGBUR", "BILIRUBINUR", "KETONESUR", "PROTEINUR", "UROBILINOGEN", "NITRITE", "LEUKOCYTESUR" in the last 72 hours.  Invalid input(s): "APPERANCEUR"     Imaging: No results found.  Medications:    vancomycin 1,500 mg (09/13/23 1749)    amLODipine  10 mg Oral Daily   amoxicillin-clavulanate  1 tablet Oral Q12H   aspirin EC  81 mg Oral Daily   enoxaparin (LOVENOX) injection  0.5 mg/kg Subcutaneous Q24H   feeding supplement  1 Container Oral TID BM   gabapentin  300 mg Oral TID   insulin aspart  0-15 Units Subcutaneous TID WC   insulin aspart  0-5 Units Subcutaneous QHS   methocarbamol  500 mg Oral BID   mycophenolate  250 mg Oral QPM   nortriptyline  10 mg Oral BID   pantoprazole  40 mg Oral Daily   predniSONE  10 mg Oral Q breakfast   rosuvastatin  20 mg Oral Daily   senna-docusate  1 tablet Oral BID   sodium zirconium cyclosilicate  10 g Oral Daily   tacrolimus  0.5 mg Oral q morning   tacrolimus  1 mg Oral QHS   tamsulosin  0.8 mg Oral Daily   trimethoprim  100 mg Oral Daily   acetaminophen **OR** acetaminophen, calcium carbonate, HYDROmorphone (DILAUDID) injection, magnesium hydroxide, menthol-cetylpyridinium, ondansetron **OR** ondansetron (ZOFRAN) IV, oxyCODONE, traZODone  Assessment/ Plan:  [name] is a 68 y.o.  male with a PMHx of hypertension, coronary artery disease, congestive heart failure, diabetes, BPH, history of CVA, history of chronic kidney disease stage IIIa and a history of liver cirrhosis s/p transplant  in 2009.  Patient now being admitted with history of diarrhea, abdominal pain and syncope/presyncopal episodes.  Patient required CT scan of the abdomen and pelvis with IV contrast to rule out any acute abdominal process. He has been on mycophenolate, tacrolimus and prednisone. He is also on Bactrim, furosemide and ACE inhibitor's.  Acute kidney injury on chronic kidney disease 3a: Patient has baseline creatinine 1.6/FR 48.  This may be secondary to prerenal azotemia. Patient also received IV contrast.  Creatinine remains stable today. Adequate urine output noted. Follow up appt in our office on 09/18/23.   Lab Results  Component Value Date   CREATININE 2.01 (H) 09/14/2023   CREATININE 1.96 (H) 09/13/2023   CREATININE 2.00 (H) 09/12/2023    Intake/Output Summary (Last 24 hours) at 09/14/2023 1151 Last data filed at 09/14/2023 1133 Gross per 24 hour  Intake 240 ml  Output 2325 ml  Net -2085 ml    #2:  s/p debridement of complex perineal abscess  necrotizing infection:  Continue present medications including Zosyn, trimethoprim, vancomycin.   General surgery performed I&D 09/09/23  Surgery will follow up outpatient.    #3: History of liver transplant:  - currently on MMF, prednisone and tacrolimus - Plan to discharge later today, will obtain tacrolimus level outpatient.  - Mycophenolate restarted by primary team.    #4: Hypertension: Remains on amlodipine.  Lisinopril held   Blood pressure acceptable.   Can restart Lisinopril at discharge.   #5 Hyperkalemia, Increased to 5.7. Primary team to order [facility]     LOS: 8 Sirr Kabel 3/30/202511:51 AM

## 2023-09-14 NOTE — TOC Transition Note (Signed)
 Transition of Care [facility]) - Discharge Note   Patient Details  Name: Devin White MRN: [id number] Date of Birth: 11-14-1955  Transition of Care [facility]) CM/SW Contact:  Liliana Cline, LCSW Phone Number: 09/14/2023, 11:26 AM   Clinical Narrative:    Patient has orders to DC home today. CSW notified Heather with [facility].   Final next level of care: Home w Home Health Services Barriers to Discharge: Barriers Resolved   Patient Goals and CMS Choice            Discharge Placement                       Discharge Plan and Services Additional resources added to the After Visit Summary for     Discharge Planning Services: CM Consult                      HH Arranged: PT, OT [facility] Agency: [facility] Date [facility] Agency Contacted: 09/14/23   Representative spoke with at [facility] Agency: Herbert Seta  Social Drivers of Health (SDOH) Interventions SDOH Screenings   Food Insecurity: No Food Insecurity (09/06/2023)  Housing: Low Risk  (09/07/2023)  Recent Concern: Housing - High Risk (07/28/2023)  Transportation Needs: No Transportation Needs (09/06/2023)  Utilities: Not At Risk (09/06/2023)  Depression (PHQ2-9): Low Risk  (02/27/2023)  Financial Resource Strain: Low Risk  (07/28/2023)  Physical Activity: Insufficiently Active (07/28/2023)  Social Connections: Socially Isolated (09/06/2023)  Stress: No Stress Concern Present (07/28/2023)  Tobacco Use: Low Risk  (09/09/2023)     Readmission Risk Interventions    11/09/2022    1:38 PM 01/31/2022    9:58 AM 11/06/2021   11:09 AM  Readmission Risk Prevention Plan  Transportation Screening Complete Complete Complete  PCP or Specialist Appt within 5-7 Days Complete  Complete  PCP or Specialist Appt within 3-5 Days  Complete   Home Care Screening Complete  Complete  Medication Review (RN CM) Referral to Pharmacy  Referral to Pharmacy  Social Work Consult for Recovery Care Planning/Counseling  Complete    Palliative Care Screening  Not Applicable   Medication Review Oceanographer)  Complete

## 2023-09-14 NOTE — Discharge Summary (Signed)
 Physician Discharge Summary   Patient: Devin White MRN: [id number] DOB: 1955/07/08  Admit date:     09/06/2023  Discharge date: 09/14/23  Discharge Physician: Jonah Blue   PCP: Karie Schwalbe, MD   Recommendations at discharge:   You are being discharged home with physical and occupational therapy and skilled nursing for wound care Continue antibiotics until gone (Augmentin and Zyvox twice daily until gone, 4/7) Continue dressing changes once daily and with all bowel movements Follow up this coming week with Dr. Alphonsus Sias; you will need repeat blood work (BMP) Take Thompson Caul daily for 3 days (including today) Continue to hold lisinopril and Lasix (furosemide) Continue Senokot; a good bowel regimen is important Follow up with nephrology on 4/4 as scheduled Follow up with surgery on 4/7 as scheduled  Discharge Diagnoses: Principal Problem:   Sepsis, polymicrobial (HCC) Active Problems:   Acute kidney injury superimposed on chronic kidney disease (HCC)   Type 2 diabetes mellitus with peripheral neuropathy (HCC)   Essential hypertension   Stage 3a chronic kidney disease (CKD) (HCC)   Gout   Dyslipidemia   Perianal abscess   Necrotizing soft tissue infection   Class 2 obesity due to excess calories with body mass index (BMI) of 35.0 to 35.9 in adult   Hospital Course: 67yo with h/o cirrhosis s/p liver transplant (2009) on immunosuppression, chronic HFpEF, GERD, BPH, HTN, CVA, stage 3a CKD, and gout who presented on 3/22 with presyncope.  He was determined to have sepsis due to perirectal abscess and necrotizing soft tissue infection of the perineum and underwent I&D and wide debridement on 3/25.    Assessment and Plan:  Sepsis due to suspected Fornier's gangrene Patient has a history of C. difficile colitis, most recently approximately 2 years ago No recent exposure to antibiotics however the patient is status post liver transplant on chronic immunosuppression Perirectal  abscess, general surgery consulted Gas noted on CT of the pelvis on 3/25 concerning for necrotizing infection Underwent urgent surgical exploration 3/25 Sepsis physiology has resolved BID wet-to-dry dressing changes with Dakins -> daily dressing changes Wound cultures with Staph haemolyticus (sensitive to Clindamycin, Rifampin, Bactrim, Vanc) and Strep constellatus (sensitive to Ceftriaxone, Erythromycin, Levofloxacin, PCN, and Vancomycin) as well as Prevotella Treated with Vanc/Zosyn -> PO Zyvox plus Augmentin for duration of treatment (14 total days)   Acute kidney injury superimposed on stage 3a chronic kidney disease Baseline creatinine 1.6, GFR 45-50 Presenting creatinine 2.67, GFR 26 Stable currently at 33/2.01/36 (possibly a new baseline) Avoid nonessential nephrotoxins Continue to hold lisinopril   Type 2 diabetes mellitus with peripheral neuropathy Last A1c was 10.6, very poor control Resume glipizide Resume long-acting insulin   Continue  SSI Carb modified diet Continue gabapentin, nortriptyline   Essential hypertension Continue amlodipine Hold lisinopril   Dyslipidemia Continue rosuvastatin   Gout No evidence of exacerbation Resume prn colchicine   Status post liver transplant/Chronic immunosuppression Tacrolimus, prednisone continued on admission Cellcept resumed (confirmed that this was ok with surgery) on 3/28 Continue trimethoprim for prophylaxis   BPH Continue tamsulosin   Class 2 Obesity Body mass index is 35.08 kg/m.Marland Kitchen  Weight loss should be encouraged Outpatient PCP/bariatric medicine f/u encouraged    Ambulatory dysfunction Recommended for SNF but he prefers home with home health He will need PT/OT and HHN for wound management His granddaughter (a former Museum/gallery conservator) will also be able to help with dressing changes DC to home today, as he has been cleared by surgery  Consultants: Surgery Nephrology PT OT   Procedures: I&D of  complex perineal abscess with wide debridement, 3/25   Antibiotics: Cefepime x 1 Cefotetan x 1 Ceftriaxone x 2  Zosyn 3/25-30 Vancomycin 3/25-30 Zyvox 3/30-4/8 Augmentin 3/30-4/8      Pain control - [place] Controlled Substance Reporting System database was reviewed. and patient was instructed, not to drive, operate heavy machinery, perform activities at heights, swimming or participation in water activities or provide baby-sitting services while on Pain, Sleep and Anxiety Medications; until their outpatient Physician has advised to do so again. Also recommended to not to take more than prescribed Pain, Sleep and Anxiety Medications.    Disposition: Home Diet recommendation:  Discharge Diet Orders (From admission, onward)     Start     Ordered   09/13/23 0000  Diet - low sodium heart healthy        09/13/23 1231           Cardiac and Carb modified diet DISCHARGE MEDICATION: Allergies as of 09/14/2023       Reactions   Levaquin [levofloxacin] Shortness Of Breath   SOB and severe leg pain/weakness        Medication List     PAUSE taking these medications    furosemide 40 MG tablet Wait to take this until your doctor or other care provider tells you to start again. Commonly known as: LASIX TAKE 1 TABLET(40 MG) BY MOUTH DAILY   lisinopril 20 MG tablet Wait to take this until your doctor or other care provider tells you to start again. Commonly known as: ZESTRIL Take 20 mg by mouth daily.       TAKE these medications    acetaminophen 325 MG tablet Commonly known as: TYLENOL Take 650 mg by mouth every 6 (six) hours as needed for mild pain.   amLODipine 10 MG tablet Commonly known as: NORVASC Take 1 tablet (10 mg total) by mouth daily.   amoxicillin-clavulanate 875-125 MG tablet Commonly known as: AUGMENTIN Take 1 tablet by mouth every 12 (twelve) hours for 8 days.   aspirin EC 81 MG tablet Take 1 tablet (81 mg total) by mouth daily. Swallow  whole.   Blood Glucose Monitoring Suppl Devi 1 each by Does not apply route in the morning, at noon, and at bedtime. May substitute to any manufacturer covered by patient's insurance.   calcium carbonate 500 MG chewable tablet Commonly known as: Tums Chew 1 tablet (200 mg of elemental calcium total) by mouth 2 (two) times daily as needed for indigestion or heartburn.   colchicine 0.6 MG tablet Take 1 tablet (0.6 mg total) by mouth daily as needed.   diclofenac Sodium 1 % Gel Commonly known as: VOLTAREN APPLY 4 GRAMS TOPICALLY TO THE AFFECTED AREA FOUR TIMES DAILY   feeding supplement (GLUCERNA SHAKE) Liqd Take 237 mLs by mouth 2 (two) times daily between meals.   FreeStyle Libre 3 Plus Sensor Misc Change sensor every 15 days.   gabapentin 300 MG capsule Commonly known as: NEURONTIN Take 1 capsule (300 mg total) by mouth 3 (three) times daily.   glipiZIDE 2.5 MG 24 hr tablet Commonly known as: GLUCOTROL XL TAKE 1 TABLET(2.5 MG) BY MOUTH DAILY WITH BREAKFAST   insulin glargine 100 UNIT/ML Solostar Pen Commonly known as: LANTUS Inject 30 Units into the skin daily.   insulin lispro 100 UNIT/ML KwikPen Commonly known as: HumaLOG KwikPen 0-15 Units, Subcutaneous, 3 times daily before meals & bedtime  CBG 70 - 120: 0 units  CBG 121 - 150: 2 units  CBG 151 - 200: 3 units  CBG 201 - 250: 5 units  CBG 251 - 300: 8 units  CBG 301 - 350: 11 units  CBG 351 - 400: 15 units  CBG > 400: call MD   Insulin Pen Needle 32G X 4 MM Misc 1 each by Does not apply route as directed.   linezolid 600 MG tablet Commonly known as: Zyvox Take 1 tablet (600 mg total) by mouth 2 (two) times daily for 8 days.   methocarbamol 500 MG tablet Commonly known as: ROBAXIN Take by mouth 2 (two) times daily.   multivitamin with minerals Tabs tablet Take 1 tablet by mouth daily.   mycophenolate 250 MG capsule Commonly known as: CELLCEPT Take 250-500 mg by mouth 2 (two) times daily. 2 capsules  (500 mg) in the morning and one capsule (250 mg) in the evening   nortriptyline 10 MG capsule Commonly known as: PAMELOR Take 10 mg by mouth 2 (two) times daily.   omeprazole 20 MG capsule Commonly known as: PRILOSEC TAKE 1 CAPSULE(20 MG) BY MOUTH DAILY   oxyCODONE 5 MG immediate release tablet Commonly known as: Oxy IR/ROXICODONE Take 1-2 tablets (5-10 mg total) by mouth every 4 (four) hours as needed for moderate pain (pain score 4-6) or severe pain (pain score 7-10).   predniSONE 10 MG tablet Commonly known as: DELTASONE Take 10 mg by mouth daily with breakfast.   rosuvastatin 20 MG tablet Commonly known as: CRESTOR Take 1 tablet (20 mg total) by mouth daily.   senna-docusate 8.6-50 MG tablet Commonly known as: Senokot-S Take 1 tablet by mouth 2 (two) times daily.   sodium zirconium cyclosilicate 10 g Pack packet Commonly known as: LOKELMA Take 10 g by mouth daily for 2 days. Start taking on: September 15, 2023   tacrolimus 0.5 MG capsule Commonly known as: PROGRAF Take 0.5-1 mg by mouth See admin instructions. Taking 0.5 mg in the morning and 1 mg at bedtime   tamsulosin 0.4 MG Caps capsule Commonly known as: FLOMAX TAKE 1 CAPSULE(0.4 MG) BY MOUTH DAILY   trimethoprim 100 MG tablet Commonly known as: TRIMPEX Take 100 mg by mouth daily.               Discharge Care Instructions  (From admission, onward)           Start     Ordered   09/14/23 0000  Discharge wound care:       Comments: Change perineal wound dressing with saline moistened gauze packing, cover with dry gauze or ABD pad.  Change once daily and as needed based on soilage/bowel movement.   09/14/23 1124   09/13/23 0000  Discharge wound care:       Comments: Light Daily packing of wound w kerlix ( or other rolled gauze), may use saline to remove dressing and to pack wound   09/13/23 1231            Follow-up Information     Karie Schwalbe, MD Follow up.   Specialties: Internal  Medicine, Pediatrics Why: Hospital follow up Contact information: [address] [place] [address] [phone]         Henrene Dodge, MD Follow up on 09/22/2023.   Specialty: General Surgery Why: For wound re-check Contact information: [address] [place] [address] [phone]                Discharge Exam:  Subjective: Still having pain but moving better, no new complaints.   Objective: Vitals:   09/14/23 0517 09/14/23 0829  BP: 132/88 (!) 143/82  Pulse: 70 76  Resp: 18 18  Temp: 97.6 F (36.4 C) 98 F (36.7 C)  SpO2: 94% 95%    Intake/Output Summary (Last 24 hours) at 09/14/2023 1127 Last data filed at 09/14/2023 0900 Gross per 24 hour  Intake 240 ml  Output 1900 ml  Net -1660 ml   Filed Weights   09/06/23 1425 09/06/23 2014  Weight: 117.9 kg 120.6 kg    Exam:  General:  Appears calm and comfortable and is in NAD Eyes:   EOMI, normal lids, iris ENT:  grossly normal hearing, lips & tongue, mmm Cardiovascular:  RRR, no m/r/g. No LE edema.  Respiratory:   CTA bilaterally with no wheezes/rales/rhonchi.  Normal respiratory effort. Abdomen:  soft, NT, ND Skin:  no rash or induration seen on limited exam Musculoskeletal:  grossly normal tone BUE/BLE, good ROM, no bony abnormality Psychiatric:  grossly normal mood and affect, speech fluent and appropriate, AOx3 Neurologic:  CN 2-12 grossly intact, moves all extremities in coordinated fashion  Data Reviewed: I have reviewed the patient's lab results since admission.  Pertinent labs for today include:   K+ 5.7 Glucose 145 BUN 33/Creatinine 2.01/GFR 36 WBC 13.8 Hgb 12.7 Wound cultures with staph haemolyticus, strep constellatus, few prevotella     Condition at discharge: improving  The results of significant diagnostics from this hospitalization (including imaging, microbiology, ancillary and laboratory) are listed below for reference.   Imaging  Studies: CT PELVIS WO CONTRAST Result Date: 09/09/2023 CLINICAL DATA:  Perineal and scrotal pain, erythema, concerning for infection EXAM: CT PELVIS WITHOUT CONTRAST TECHNIQUE: Multidetector CT imaging of the pelvis was performed following the standard protocol without intravenous contrast. RADIATION DOSE REDUCTION: This exam was performed according to the departmental dose-optimization program which includes automated exposure control, adjustment of the mA and/or kV according to patient size and/or use of iterative reconstruction technique. COMPARISON:  09/06/2023 FINDINGS: Urinary Tract: Visualized distal ureters decompressed. Urinary bladder physiologically distended. Bowel: Visualized small bowel and colon are nondilated. Scattered distal descending and proximal sigmoid diverticula without adjacent inflammatory change. Vascular/Lymphatic: Mild scattered aortoiliac calcified plaque without aneurysm. No pelvic adenopathy. Reproductive: Prostate unremarkable. Seminal vesicles mildly distended as before. Other: Right medial gluteal scattered bubbles of subcutaneous gas with regional inflammatory/edematous changes. No discrete abscess. No suggestion of extension to the scrotum, which is incompletely visualized. Musculoskeletal: Posterior lumbar fixation hardware L4-S1. IMPRESSION: 1. Right perianal/perineal gas consistent with infection, without drainable abscess. Electronically Signed   By: Corlis Leak M.D.   On: 09/09/2023 13:32   CT CHEST ABDOMEN PELVIS W CONTRAST Result Date: 09/06/2023 CLINICAL DATA:  Sepsis lower abd pain, sepsis, RLQ pain, concern for perirectal abscess EXAM: CT CHEST, ABDOMEN, AND PELVIS WITH CONTRAST TECHNIQUE: Multidetector CT imaging of the chest, abdomen and pelvis was performed following the standard protocol during bolus administration of intravenous contrast. RADIATION DOSE REDUCTION: This exam was performed according to the departmental dose-optimization program which includes  automated exposure control, adjustment of the mA and/or kV according to patient size and/or use of iterative reconstruction technique. CONTRAST:  75mL OMNIPAQUE IOHEXOL 300 MG/ML  SOLN COMPARISON:  None Available. FINDINGS: CT CHEST FINDINGS Cardiovascular: Normal heart size. No significant pericardial effusion. The thoracic aorta is normal in caliber. Mild atherosclerotic plaque of the thoracic aorta. Left anterior descending coronary artery calcifications versus possible stent-limited evaluation due to motion artifact. Right  main coronary artery calcification. Mediastinum/Nodes: No enlarged mediastinal, hilar, or axillary lymph nodes. Thyroid gland, trachea, and esophagus demonstrate no significant findings. Lungs/Pleura: Limited evaluation due to motion artifact. No focal consolidation. No pulmonary nodule. No pulmonary mass. No pleural effusion. No pneumothorax. Musculoskeletal: No chest wall abnormality. No suspicious lytic or blastic osseous lesions. No acute displaced fracture. CT ABDOMEN PELVIS FINDINGS Hepatobiliary: No focal liver abnormality. Status post cholecystectomy. No biliary dilatation. Pancreas: Diffusely atrophic. No focal lesion. Otherwise normal pancreatic contour. No surrounding inflammatory changes. No main pancreatic ductal dilatation. Spleen: Normal in size without focal abnormality. Adrenals/Urinary Tract: No adrenal nodule bilaterally. Bilateral renal cortical scarring. Bilateral kidneys enhance symmetrically. Fluid density lesions likely represent simple renal cysts. Simple renal cysts, in the absence of clinically indicated signs/symptoms, require no independent follow-up. Subcentimeter hypodensity too small to characterize-no further follow-up indicated. No hydronephrosis. No hydroureter. Punctate left nephrolithiasis. No right nephrolithiasis. No ureterolithiasis bilaterally. The urinary bladder is unremarkable. No excretion of intravenous contrast from the kidneys on delayed view.  Stomach/Bowel: Stomach is within normal limits. No evidence of bowel wall thickening or dilatation. Colonic diverticulosis. Possible tiny perianal foci of gas with no organized fluid collection. Please note the inferior-most portion of the gluteal clefts and perineal regions are not visualized due to collimation off view. Status post appendectomy. Vascular/Lymphatic: No abdominal aorta or iliac aneurysm. Moderate atherosclerotic plaque of the aorta and its branches. No abdominal, pelvic, or inguinal lymphadenopathy. Reproductive: Prostate is unremarkable. Other: No intraperitoneal free fluid. No intraperitoneal free gas. No organized fluid collection. Musculoskeletal: Tiny fat containing umbilical hernia. No suspicious lytic or blastic osseous lesions. No acute displaced fracture. L4-S1 posterolateral surgical hardware. No CT evidence of surgical hardware complication. IMPRESSION: 1. No acute intrathoracic, intra-abdominal, intrapelvic abnormality. 2. Possible tiny perianal foci of gas with no organized fluid collection. Please note the inferior-most portion of the gluteal clefts and perineal regions are not visualized due to collimation off view. 3. Nonobstructive punctate left nephrolithiasis. 4. Colonic diverticulosis with no acute diverticulitis. 5. No excretion of intravenous contrast from the kidneys on delayed view. Recommend correlation with renal function tests. 6.  Aortic Atherosclerosis (ICD10-I70.0). Electronically Signed   By: Tish Frederickson M.D.   On: 09/06/2023 17:30   CT Head Wo Contrast Result Date: 09/06/2023 CLINICAL DATA:  Headache, new onset (Age >= 51y) EXAM: CT HEAD WITHOUT CONTRAST TECHNIQUE: Contiguous axial images were obtained from the base of the skull through the vertex without intravenous contrast. RADIATION DOSE REDUCTION: This exam was performed according to the departmental dose-optimization program which includes automated exposure control, adjustment of the mA and/or kV  according to patient size and/or use of iterative reconstruction technique. COMPARISON:  CT head 04/18/2023 FINDINGS: Brain: Patchy and confluent areas of decreased attenuation are noted throughout the deep and periventricular white matter of the cerebral hemispheres bilaterally, compatible with chronic microvascular ischemic disease. No evidence of large-territorial acute infarction. No parenchymal hemorrhage. No mass lesion. No extra-axial collection. No mass effect or midline shift. No hydrocephalus. Basilar cisterns are patent. Vascular: No hyperdense vessel. Atherosclerotic calcifications are present within the cavernous internal carotid and vertebral arteries. Skull: No acute fracture or focal lesion. Sinuses/Orbits: Right sphenoid sinus mucosal thickening. Paranasal sinuses and mastoid air cells are clear. The orbits are unremarkable. Other: None. IMPRESSION: No acute intracranial abnormality. Electronically Signed   By: Tish Frederickson M.D.   On: 09/06/2023 17:21    Microbiology: Results for orders placed or performed during the hospital encounter of 09/06/23  Blood Culture (routine  x 2)     Status: None   Collection Time: 09/06/23  3:53 PM   Specimen: BLOOD RIGHT ARM  Result Value Ref Range Status   Specimen Description BLOOD RIGHT ARM  Final   Special Requests   Final    BOTTLES DRAWN AEROBIC AND ANAEROBIC Blood Culture results may not be optimal due to an inadequate volume of blood received in culture bottles   Culture   Final    NO GROWTH 5 DAYS Performed at [facility], [address], [place], [place] [address]    Report Status 09/11/2023 FINAL  Final  Blood Culture (routine x 2)     Status: None   Collection Time: 09/06/23  3:53 PM   Specimen: BLOOD  Result Value Ref Range Status   Specimen Description BLOOD LEFT ANTECUBITAL  Final   Special Requests   Final    BOTTLES DRAWN AEROBIC AND ANAEROBIC Blood Culture adequate volume   Culture   Final    NO GROWTH 5  DAYS Performed at [facility], [address] [place], [place] [address]    Report Status 09/11/2023 FINAL  Final  Resp panel by RT-PCR (RSV, Flu A&B, Covid) Anterior Nasal Swab     Status: None   Collection Time: 09/06/23  5:01 PM   Specimen: Anterior Nasal Swab  Result Value Ref Range Status   SARS Coronavirus 2 by RT PCR NEGATIVE NEGATIVE Final    Comment: (NOTE) SARS-CoV-2 target nucleic acids are NOT DETECTED.  The SARS-CoV-2 RNA is generally detectable in upper respiratory specimens during the acute phase of infection. The lowest concentration of SARS-CoV-2 viral copies this assay can detect is 138 copies/mL. A negative result does not preclude SARS-Cov-2 infection and should not be used as the sole basis for treatment or other patient management decisions. A negative result may occur with  improper specimen collection/handling, submission of specimen other than nasopharyngeal swab, presence of viral mutation(s) within the areas targeted by this assay, and inadequate number of viral copies(<138 copies/mL). A negative result must be combined with clinical observations, patient history, and epidemiological information. The expected result is Negative.  Fact Sheet for Patients:  BloggerCourse.com  Fact Sheet for Healthcare Providers:  SeriousBroker.it  This test is no t yet approved or cleared by the [place] FDA and  has been authorized for detection and/or diagnosis of SARS-CoV-2 by FDA under an Emergency Use Authorization (EUA). This EUA will remain  in effect (meaning this test can be used) for the duration of the COVID-19 declaration under Section 564(b)(1) of the Act, 21 U.S.C.section 360bbb-3(b)(1), unless the authorization is terminated  or revoked sooner.       Influenza A by PCR NEGATIVE NEGATIVE Final   Influenza B by PCR NEGATIVE NEGATIVE Final    Comment: (NOTE) The Xpert Xpress  SARS-CoV-2/FLU/RSV plus assay is intended as an aid in the diagnosis of influenza from Nasopharyngeal swab specimens and should not be used as a sole basis for treatment. Nasal washings and aspirates are unacceptable for Xpert Xpress SARS-CoV-2/FLU/RSV testing.  Fact Sheet for Patients: BloggerCourse.com  Fact Sheet for Healthcare Providers: SeriousBroker.it  This test is not yet approved or cleared by the [place] FDA and has been authorized for detection and/or diagnosis of SARS-CoV-2 by FDA under an Emergency Use Authorization (EUA). This EUA will remain in effect (meaning this test can be used) for the duration of the COVID-19 declaration under Section 564(b)(1) of the Act, 21 U.S.C. section 360bbb-3(b)(1), unless the  authorization is terminated or revoked.     Resp Syncytial Virus by PCR NEGATIVE NEGATIVE Final    Comment: (NOTE) Fact Sheet for Patients: BloggerCourse.com  Fact Sheet for Healthcare Providers: SeriousBroker.it  This test is not yet approved or cleared by the [place] FDA and has been authorized for detection and/or diagnosis of SARS-CoV-2 by FDA under an Emergency Use Authorization (EUA). This EUA will remain in effect (meaning this test can be used) for the duration of the COVID-19 declaration under Section 564(b)(1) of the Act, 21 U.S.C. section 360bbb-3(b)(1), unless the authorization is terminated or revoked.  Performed at [facility], [address] [place], [place] [address]   Aerobic/Anaerobic Culture w Gram Stain (surgical/deep wound)     Status: None (Preliminary result)   Collection Time: 09/09/23  3:19 PM   Specimen: Abscess  Result Value Ref Range Status   Specimen Description   Final    ABSCESS Performed at [facility], [address], [place], [place] [address]    Special Requests PERINEAL INFECTION 1 SPEC  A  Final   Gram Stain   Final    RARE WBC PRESENT, PREDOMINANTLY PMN FEW GRAM POSITIVE COCCI RARE GRAM NEGATIVE RODS    Culture   Final    RARE STAPHYLOCOCCUS HAEMOLYTICUS FEW STREPTOCOCCUS CONSTELLATUS FEW PREVOTELLA SPECIES BETA LACTAMASE POSITIVE Performed at [facility], [address] [place], [place] [address]    Report Status PENDING  Incomplete   Organism ID, Bacteria STAPHYLOCOCCUS HAEMOLYTICUS  Final   Organism ID, Bacteria STREPTOCOCCUS CONSTELLATUS  Final      Susceptibility   Streptococcus constellatus - MIC*    PENICILLIN <=0.06 SENSITIVE Sensitive     CEFTRIAXONE <=0.12 SENSITIVE Sensitive     ERYTHROMYCIN <=0.12 SENSITIVE Sensitive     LEVOFLOXACIN <=0.25 SENSITIVE Sensitive     VANCOMYCIN 0.25 SENSITIVE Sensitive     * FEW STREPTOCOCCUS CONSTELLATUS   Staphylococcus haemolyticus - MIC*    CIPROFLOXACIN 2 INTERMEDIATE Intermediate     ERYTHROMYCIN >=8 RESISTANT Resistant     GENTAMICIN >=16 RESISTANT Resistant     OXACILLIN >=4 RESISTANT Resistant     TETRACYCLINE >=16 RESISTANT Resistant     VANCOMYCIN 1 SENSITIVE Sensitive     TRIMETH/SULFA <=10 SENSITIVE Sensitive     CLINDAMYCIN >=8 RESISTANT Resistant     RIFAMPIN <=0.5 SENSITIVE Sensitive     Inducible Clindamycin NEGATIVE Sensitive     * RARE STAPHYLOCOCCUS HAEMOLYTICUS  Aerobic/Anaerobic Culture w Gram Stain (surgical/deep wound)     Status: None (Preliminary result)   Collection Time: 09/09/23  3:27 PM   Specimen: Abscess  Result Value Ref Range Status   Specimen Description   Final    ABSCESS Performed at [facility], [address], [place], [place] [address]    Special Requests PERINEAL INFECTION 2 SPEC B  Final   Gram Stain   Final    RARE WBC PRESENT, PREDOMINANTLY PMN FEW GRAM POSITIVE COCCI RARE GRAM NEGATIVE RODS    Culture   Final    FEW STREPTOCOCCUS CONSTELLATUS SUSCEPTIBILITIES PERFORMED ON PREVIOUS CULTURE WITHIN THE LAST 5 DAYS. FEW PREVOTELLA SPECIES BETA  LACTAMASE POSITIVE Performed at [facility], [address] [place], [place] [address]    Report Status PENDING  Incomplete  Aerobic/Anaerobic Culture w Gram Stain (surgical/deep wound)     Status: None (Preliminary result)   Collection Time: 09/09/23  3:27 PM   Specimen: Abscess  Result Value Ref Range Status  Specimen Description   Final    ABSCESS Performed at [facility], [address] [place], [place] [address]    Special Requests PERINEAL INFECTION 3 SPEC C  Final   Gram Stain   Final    RARE WBC PRESENT, PREDOMINANTLY PMN RARE GRAM POSITIVE COCCI    Culture   Final    FEW STREPTOCOCCUS CONSTELLATUS SUSCEPTIBILITIES PERFORMED ON PREVIOUS CULTURE WITHIN THE LAST 5 DAYS. RARE PREVOTELLA SPECIES BETA LACTAMASE POSITIVE Performed at [facility], [address] [place], [place] [address]    Report Status PENDING  Incomplete    Labs: CBC: Recent Labs  Lab 09/10/23 0840 09/11/23 0423 09/12/23 0418 09/13/23 0345 09/14/23 0336  WBC 11.6* 10.1 12.0* 11.8* 13.8*  NEUTROABS 8.8* 7.4 9.0* 8.0* 8.8*  HGB 11.4* 12.2* 11.9* 12.6* 12.7*  HCT 34.0* 36.6* 36.8* 37.1* 38.3*  MCV 93.9 95.8 96.8 95.6 95.8  PLT 147* 163 194 224 253   Basic Metabolic Panel: Recent Labs  Lab 09/10/23 0840 09/11/23 0423 09/12/23 0418 09/13/23 0345 09/14/23 0336  NA 137 140 140 138 139  K 4.4 4.1 5.8* 4.8 5.7*  CL 104 103 105 101 101  CO2 24 23 26 27 27   GLUCOSE 157* 133* 156* 146* 145*  BUN 21 21 23  31* 33*  CREATININE 2.36* 2.21* 2.00* 1.96* 2.01*  CALCIUM 8.1* 8.6* 9.2 9.3 9.4   Liver Function Tests: Recent Labs  Lab 09/10/23 0840  AST 17  ALT 21  ALKPHOS 50  BILITOT 1.2  PROT 6.1*  ALBUMIN 2.7*   CBG: Recent Labs  Lab 09/13/23 1618 09/13/23 1941 09/14/23 0519 09/14/23 0807 09/14/23 1111  GLUCAP 205* 189* 132* 122* 173*    Discharge time spent: greater than 30 minutes.  Signed: Jonah Blue, MD [facility] 09/14/2023

## 2023-09-14 NOTE — Plan of Care (Signed)
  Problem: Fluid Volume: Goal: Hemodynamic stability will improve Outcome: Progressing   Problem: Clinical Measurements: Goal: Diagnostic test results will improve Outcome: Progressing Goal: Signs and symptoms of infection will decrease Outcome: Progressing   Problem: Respiratory: Goal: Ability to maintain adequate ventilation will improve Outcome: Progressing   Problem: Education: Goal: Knowledge of General Education information will improve Description: Including pain rating scale, medication(s)/side effects and non-pharmacologic comfort measures Outcome: Progressing   Problem: Health Behavior/Discharge Planning: Goal: Ability to manage health-related needs will improve Outcome: Progressing   Problem: Clinical Measurements: Goal: Ability to maintain clinical measurements within normal limits will improve Outcome: Progressing Goal: Will remain free from infection Outcome: Progressing Goal: Diagnostic test results will improve Outcome: Progressing Goal: Respiratory complications will improve Outcome: Progressing Goal: Cardiovascular complication will be avoided Outcome: Progressing   Problem: Activity: Goal: Risk for activity intolerance will decrease Outcome: Progressing   Problem: Coping: Goal: Level of anxiety will decrease Outcome: Progressing   Problem: Nutrition: Goal: Adequate nutrition will be maintained Outcome: Progressing   Problem: Elimination: Goal: Will not experience complications related to bowel motility Outcome: Progressing Goal: Will not experience complications related to urinary retention Outcome: Progressing

## 2023-09-15 ENCOUNTER — Telehealth: Payer: Self-pay

## 2023-09-15 LAB — AEROBIC/ANAEROBIC CULTURE W GRAM STAIN (SURGICAL/DEEP WOUND)

## 2023-09-15 NOTE — Transitions of Care (Post Inpatient/ED Visit) (Signed)
   09/15/2023  Name: Devin White MRN: [id number] DOB: 07-27-1955  Today's TOC FU Call Status: Today's TOC FU Call Status:: Unsuccessful Call (1st Attempt) Unsuccessful Call (1st Attempt) Date: 09/15/23  Attempted to reach the patient regarding the most recent Inpatient/ED visit. Patient was called in an Outreach attempt to offer VBCI  30-day TOC program. Pt is eligible for program due to potential risk for readmission and/or high utilization. Unfortunately, I was not able to speak with the patient in regards to recent hospital discharge    Patient's voicemail has  a generic greeting. To maintain HIPAA compliance, left message with VBCI CM contact information only  and a request for a call back .     Follow Up Plan: Additional outreach attempts will be made to reach the patient to complete the Transitions of Care (Post Inpatient/ED visit) call.   Susa Loffler , BSN, RN [facility]   VBCI-Population Health RN Care Manager Direct Dial [phone]  Fax: [phone] Website: [email or web address]

## 2023-09-15 NOTE — Consult Note (Signed)
 [facility] Liaison Note  09/15/2023  Devin White 01/12/1956 [id number]  Location: RN Hospital Liaison screened the patient remotely at [facility].  Insurance: [employer]   Devin White is a 68 y.o. male who is a Primary Care Patient of Tillman Abide I, MD The patient was screened for readmission hospitalization with noted high risk score for unplanned readmission risk with 2 IP in 6 months.  The patient was assessed for potential Care Management service needs for post hospital transition for care coordination. Review of patient's electronic medical record reveals patient was admitted for Sepsis. Liaison spoke with pt concerning VBCI services and offered post hospital prevention readmission follow up calls (receptive). Pt inquired on wound care to his operative site and indicated RN was not ordered for [facility] services.  Pt has made a request and  with the [facility] and states his grand-daughter will be changing his dressing daily until further notice. Pt states the site "looks good" as it continues to heal with no issues reported. Liaison will make a referral for post hospital prevention readmission follow up calls.  Plan: [facility] Liaison will continue to follow progress and disposition to asess for post hospital community care coordination/management needs.  Referral request for community care coordination: Liaison will make a referral for post hospital prevention readmission follow up calls.   VBCI Care Management/Population Health does not replace or interfere with any arrangements made by the Inpatient Transition of Care team.   For questions contact:   Elliot Cousin, RN, BSN Hospital Liaison [place]   [facility], Population Health Office Hours MTWF  8:00 am-6:00 pm Direct Dial: [phone] mobile [email or web address]

## 2023-09-15 NOTE — Telephone Encounter (Signed)
 Copied from CRM [id number]. Topic: General - Other >> Sep 15, 2023  1:55 PM Aisha D wrote: Reason for CRM: Patient stated that his insurance company, [facility], advised him to reach out to the [facility] for an in home nurse. Patient stated that he needs a referral for a nurse to come to his house and help change out his gauzes.

## 2023-09-16 ENCOUNTER — Telehealth: Payer: Self-pay

## 2023-09-16 ENCOUNTER — Ambulatory Visit (INDEPENDENT_AMBULATORY_CARE_PROVIDER_SITE_OTHER): Admitting: Internal Medicine

## 2023-09-16 ENCOUNTER — Encounter: Payer: Self-pay | Admitting: Internal Medicine

## 2023-09-16 VITALS — BP 110/74 | HR 99 | Temp 98.0°F | Ht 73.0 in | Wt 257.0 lb

## 2023-09-16 DIAGNOSIS — I5032 Chronic diastolic (congestive) heart failure: Secondary | ICD-10-CM | POA: Diagnosis not present

## 2023-09-16 DIAGNOSIS — Z944 Liver transplant status: Secondary | ICD-10-CM | POA: Diagnosis not present

## 2023-09-16 DIAGNOSIS — Z79621 Long term (current) use of calcineurin inhibitor: Secondary | ICD-10-CM | POA: Diagnosis not present

## 2023-09-16 DIAGNOSIS — K612 Anorectal abscess: Secondary | ICD-10-CM | POA: Diagnosis not present

## 2023-09-16 DIAGNOSIS — Z8673 Personal history of transient ischemic attack (TIA), and cerebral infarction without residual deficits: Secondary | ICD-10-CM | POA: Diagnosis not present

## 2023-09-16 DIAGNOSIS — R3911 Hesitancy of micturition: Secondary | ICD-10-CM | POA: Diagnosis not present

## 2023-09-16 DIAGNOSIS — R410 Disorientation, unspecified: Secondary | ICD-10-CM | POA: Diagnosis not present

## 2023-09-16 DIAGNOSIS — A499 Bacterial infection, unspecified: Secondary | ICD-10-CM | POA: Diagnosis not present

## 2023-09-16 DIAGNOSIS — K611 Rectal abscess: Secondary | ICD-10-CM | POA: Diagnosis not present

## 2023-09-16 DIAGNOSIS — D849 Immunodeficiency, unspecified: Secondary | ICD-10-CM | POA: Diagnosis not present

## 2023-09-16 DIAGNOSIS — L0231 Cutaneous abscess of buttock: Secondary | ICD-10-CM | POA: Diagnosis not present

## 2023-09-16 DIAGNOSIS — Z79624 Long term (current) use of inhibitors of nucleotide synthesis: Secondary | ICD-10-CM | POA: Diagnosis not present

## 2023-09-16 DIAGNOSIS — K610 Anal abscess: Secondary | ICD-10-CM | POA: Diagnosis not present

## 2023-09-16 DIAGNOSIS — N179 Acute kidney failure, unspecified: Secondary | ICD-10-CM | POA: Diagnosis not present

## 2023-09-16 DIAGNOSIS — D84821 Immunodeficiency due to drugs: Secondary | ICD-10-CM | POA: Diagnosis not present

## 2023-09-16 DIAGNOSIS — Z7982 Long term (current) use of aspirin: Secondary | ICD-10-CM | POA: Diagnosis not present

## 2023-09-16 DIAGNOSIS — R110 Nausea: Secondary | ICD-10-CM | POA: Diagnosis not present

## 2023-09-16 DIAGNOSIS — R6883 Chills (without fever): Secondary | ICD-10-CM | POA: Diagnosis not present

## 2023-09-16 DIAGNOSIS — N189 Chronic kidney disease, unspecified: Secondary | ICD-10-CM

## 2023-09-16 DIAGNOSIS — Z7952 Long term (current) use of systemic steroids: Secondary | ICD-10-CM | POA: Diagnosis not present

## 2023-09-16 DIAGNOSIS — K219 Gastro-esophageal reflux disease without esophagitis: Secondary | ICD-10-CM | POA: Diagnosis not present

## 2023-09-16 DIAGNOSIS — Z604 Social exclusion and rejection: Secondary | ICD-10-CM | POA: Diagnosis not present

## 2023-09-16 DIAGNOSIS — M130 Polyarthritis, unspecified: Secondary | ICD-10-CM | POA: Diagnosis not present

## 2023-09-16 DIAGNOSIS — M1A9XX1 Chronic gout, unspecified, with tophus (tophi): Secondary | ICD-10-CM | POA: Diagnosis not present

## 2023-09-16 DIAGNOSIS — Z794 Long term (current) use of insulin: Secondary | ICD-10-CM | POA: Diagnosis not present

## 2023-09-16 DIAGNOSIS — Z792 Long term (current) use of antibiotics: Secondary | ICD-10-CM | POA: Diagnosis not present

## 2023-09-16 DIAGNOSIS — Z881 Allergy status to other antibiotic agents status: Secondary | ICD-10-CM | POA: Diagnosis not present

## 2023-09-16 DIAGNOSIS — R42 Dizziness and giddiness: Secondary | ICD-10-CM | POA: Diagnosis not present

## 2023-09-16 DIAGNOSIS — M7989 Other specified soft tissue disorders: Secondary | ICD-10-CM | POA: Diagnosis not present

## 2023-09-16 DIAGNOSIS — I130 Hypertensive heart and chronic kidney disease with heart failure and stage 1 through stage 4 chronic kidney disease, or unspecified chronic kidney disease: Secondary | ICD-10-CM | POA: Diagnosis not present

## 2023-09-16 DIAGNOSIS — E11628 Type 2 diabetes mellitus with other skin complications: Secondary | ICD-10-CM | POA: Diagnosis not present

## 2023-09-16 DIAGNOSIS — Z9889 Other specified postprocedural states: Secondary | ICD-10-CM | POA: Diagnosis not present

## 2023-09-16 DIAGNOSIS — N1831 Chronic kidney disease, stage 3a: Secondary | ICD-10-CM | POA: Diagnosis not present

## 2023-09-16 DIAGNOSIS — E1122 Type 2 diabetes mellitus with diabetic chronic kidney disease: Secondary | ICD-10-CM | POA: Diagnosis not present

## 2023-09-16 DIAGNOSIS — Z79899 Other long term (current) drug therapy: Secondary | ICD-10-CM | POA: Diagnosis not present

## 2023-09-16 LAB — HEPATIC FUNCTION PANEL
ALT: 28 U/L (ref 0–53)
AST: 21 U/L (ref 0–37)
Albumin: 4.2 g/dL (ref 3.5–5.2)
Alkaline Phosphatase: 68 U/L (ref 39–117)
Bilirubin, Direct: 0.1 mg/dL (ref 0.0–0.3)
Total Bilirubin: 0.8 mg/dL (ref 0.2–1.2)
Total Protein: 7.1 g/dL (ref 6.0–8.3)

## 2023-09-16 LAB — RENAL FUNCTION PANEL
Albumin: 4.2 g/dL (ref 3.5–5.2)
BUN: 28 mg/dL — ABNORMAL HIGH (ref 6–23)
CO2: 25 meq/L (ref 19–32)
Calcium: 9.2 mg/dL (ref 8.4–10.5)
Chloride: 100 meq/L (ref 96–112)
Creatinine, Ser: 2.26 mg/dL — ABNORMAL HIGH (ref 0.40–1.50)
GFR: 29.65 mL/min — ABNORMAL LOW (ref >60.00)
Glucose, Bld: 176 mg/dL — ABNORMAL HIGH (ref 70–99)
Phosphorus: 4.2 mg/dL (ref 2.3–4.6)
Potassium: 5 meq/L (ref 3.5–5.1)
Sodium: 139 meq/L (ref 135–145)

## 2023-09-16 LAB — CBC
HCT: 45.3 % (ref 39.0–52.0)
Hemoglobin: 15 g/dL (ref 13.0–17.0)
MCHC: 33.1 g/dL (ref 30.0–36.0)
MCV: 96.8 fl (ref 78.0–100.0)
Platelets: 420 10*3/uL — ABNORMAL HIGH (ref 150.0–400.0)
RBC: 4.68 Mil/uL (ref 4.22–5.81)
RDW: 15.4 % (ref 11.5–15.5)
WBC: 21 10*3/uL (ref 4.0–10.5)

## 2023-09-16 MED ORDER — LINEZOLID 600 MG PO TABS: 600.0000 mg | ORAL_TABLET | Freq: Two times a day (BID) | ORAL | 0 refills | Status: AC

## 2023-09-16 MED ORDER — AMOXICILLIN-POT CLAVULANATE 875-125 MG PO TABS: 1.0000 | ORAL_TABLET | Freq: Two times a day (BID) | ORAL | 0 refills | Status: AC

## 2023-09-16 NOTE — Progress Notes (Signed)
 Subjective:    Patient ID: Devin White, male    DOB: 03-Jun-1956, 68 y.o.   MRN: [id number]  HPI Here for hospital follow up  Admitted 3/22 to [facility]--dizzy and presyncopal and felt sick with diarrhea Had chills and then burning up (and no appetite) Diagnosed with sepsis  Given antibiotics--then noticed mass on posterior pelvis--and that was spreading to groin Gas seen on pelvic CT so felt to have a perirectal abscess that required I&D and debridement emergently on 3/25 Necrotizing soft tissue infection diagnosed---cultures with multiple bacteria Home on 3/30--to finish course with augmentin/zyvox Getting home health PT/OT and nursing for wound care (did not want SNF) Granddaughter also helping with dressing changes  AKI with decrease in GFR to 36 Associated with elevated potassium as well--last day of lokelma Lisinopril/furosemide on hold  Had been improving but woke with some chills this morning Took tylenol Appetite is off again  Current Outpatient Medications on File Prior to Visit  Medication Sig Dispense Refill   acetaminophen (TYLENOL) 325 MG tablet Take 650 mg by mouth every 6 (six) hours as needed for mild pain.     amLODipine (NORVASC) 10 MG tablet Take 1 tablet (10 mg total) by mouth daily. 90 tablet 3   amoxicillin-clavulanate (AUGMENTIN) 875-125 MG tablet Take 1 tablet by mouth every 12 (twelve) hours for 8 days. 16 tablet 0   aspirin EC 81 MG tablet Take 1 tablet (81 mg total) by mouth daily. Swallow whole. 30 tablet 0   Blood Glucose Monitoring Suppl DEVI 1 each by Does not apply route in the morning, at noon, and at bedtime. May substitute to any manufacturer covered by patient's insurance. 1 each 0   calcium carbonate (TUMS) 500 MG chewable tablet Chew 1 tablet (200 mg of elemental calcium total) by mouth 2 (two) times daily as needed for indigestion or heartburn. 180 tablet 1   colchicine 0.6 MG tablet Take 1 tablet (0.6 mg total) by mouth daily as needed. 30  tablet 1   Continuous Glucose Sensor (FREESTYLE LIBRE 3 PLUS SENSOR) MISC Change sensor every 15 days. 2 each 12   diclofenac Sodium (VOLTAREN) 1 % GEL APPLY 4 GRAMS TOPICALLY TO THE AFFECTED AREA FOUR TIMES DAILY 100 g 5   feeding supplement, GLUCERNA SHAKE, (GLUCERNA SHAKE) LIQD Take 237 mLs by mouth 2 (two) times daily between meals. 237 mL 0   [Paused] furosemide (LASIX) 40 MG tablet TAKE 1 TABLET(40 MG) BY MOUTH DAILY 90 tablet 3   gabapentin (NEURONTIN) 300 MG capsule Take 1 capsule (300 mg total) by mouth 3 (three) times daily. 270 capsule 3   glipiZIDE (GLUCOTROL XL) 2.5 MG 24 hr tablet TAKE 1 TABLET(2.5 MG) BY MOUTH DAILY WITH BREAKFAST 90 tablet 0   insulin glargine (LANTUS) 100 UNIT/ML Solostar Pen Inject 30 Units into the skin daily. 1 mL 0   insulin lispro (HUMALOG KWIKPEN) 100 UNIT/ML KwikPen 0-15 Units, Subcutaneous, 3 times daily before meals & bedtime  CBG 70 - 120: 0 units  CBG 121 - 150: 2 units  CBG 151 - 200: 3 units  CBG 201 - 250: 5 units  CBG 251 - 300: 8 units  CBG 301 - 350: 11 units  CBG 351 - 400: 15 units  CBG > 400: call MD 15 mL 11   Insulin Pen Needle 32G X 4 MM MISC 1 each by Does not apply route as directed. 200 each 11   linezolid (ZYVOX) 600 MG tablet Take 1 tablet (600 mg  total) by mouth 2 (two) times daily for 8 days. 16 tablet 0   [Paused] lisinopril (ZESTRIL) 20 MG tablet Take 20 mg by mouth daily.     methocarbamol (ROBAXIN) 500 MG tablet Take by mouth 2 (two) times daily.     Multiple Vitamin (MULTIVITAMIN WITH MINERALS) TABS tablet Take 1 tablet by mouth daily.     mycophenolate (CELLCEPT) 250 MG capsule Take 250-500 mg by mouth 2 (two) times daily. 2 capsules (500 mg) in the morning and one capsule (250 mg) in the evening     nortriptyline (PAMELOR) 10 MG capsule Take 10 mg by mouth 2 (two) times daily.     omeprazole (PRILOSEC) 20 MG capsule TAKE 1 CAPSULE(20 MG) BY MOUTH DAILY 30 capsule 11   oxyCODONE (OXY IR/ROXICODONE) 5 MG immediate release  tablet Take 1-2 tablets (5-10 mg total) by mouth every 4 (four) hours as needed for moderate pain (pain score 4-6) or severe pain (pain score 7-10). 30 tablet 0   predniSONE (DELTASONE) 10 MG tablet Take 10 mg by mouth daily with breakfast.     rosuvastatin (CRESTOR) 20 MG tablet Take 1 tablet (20 mg total) by mouth daily. 90 tablet 3   senna-docusate (SENOKOT-S) 8.6-50 MG tablet Take 1 tablet by mouth 2 (two) times daily. 60 tablet 0   sodium zirconium cyclosilicate (LOKELMA) 10 g PACK packet Take 10 g by mouth daily for 2 days. 2 packet 0   tacrolimus (PROGRAF) 0.5 MG capsule Take 0.5-1 mg by mouth See admin instructions. Taking 0.5 mg in the morning and 1 mg at bedtime     tamsulosin (FLOMAX) 0.4 MG CAPS capsule TAKE 1 CAPSULE(0.4 MG) BY MOUTH DAILY 90 capsule 3   trimethoprim (TRIMPEX) 100 MG tablet Take 100 mg by mouth daily.     No current facility-administered medications on file prior to visit.    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   History of cirrhosis of liver S/P TRANSPLANT 2009   History of liver failure S/P TRANSPLANT   Hypertension    Left ureteral calculus    Renal disorder    Stroke [facility] [place] [facility])     Past Surgical History:  Procedure Laterality Date   APPENDECTOMY     bone morrow biopsy     CHOLECYSTECTOMY  2007   CYSTO/ LEFT RETROGRADE PYELOGRAM/ LEFT URETERAL STENT PLACEMENT  03-05-2012  DR Berneice Heinrich [facility])   LEFT URETERAL CALCULI   CYSTOSCOPY W/ URETERAL STENT PLACEMENT  03/11/2012   Procedure: CYSTOSCOPY WITH STENT REPLACEMENT;  Surgeon: Sebastian Ache, MD;  Location: [facility];  Service:  Urology;  Laterality: Left;   HERNIA REPAIR  2008   INCISION AND DRAINAGE ABSCESS N/A 09/09/2023   Procedure: INCISION AND DRAINAGE, ABSCESS;  Surgeon: Leafy Ro, MD;  Location: [facility] ORS;  Service: General;  Laterality: N/A;  Lithotomy   LIVER BIOPSY     LIVER TRANSPLANT  11/24/2007   pt states doing well since liver transplant   LUMBAR DISC SURGERY     LUMBAR FUSION     removal of fistula  URETEROSCOPY  03/11/2012   Procedure: URETEROSCOPY;  Surgeon: Sebastian Ache, MD;  Location: [facility];  Service: Urology;  Laterality: Left;   STONE MANIPULATION, stone obtained  [phone] UHC MCR    Family History  Problem Relation Age of Onset   Cancer Mother        colon   Arthritis Mother    Vasculitis Mother    Hypertension Mother    Cancer Father        colon   Kidney disease Father    Arthritis Father    Arthritis Brother    Alcohol abuse Maternal Aunt    Diabetes Maternal Aunt    Alcohol abuse Maternal Uncle    Diabetes Paternal Aunt    Alcohol abuse Maternal Uncle    Alcohol abuse Maternal Uncle     Social History   Socioeconomic History   Marital status: Single    Spouse name: Not on file   Number of children: 3   Years of education: Not on file   Highest education level: 9th grade  Occupational History   Occupation: disabled    Employer: RETIRED  Tobacco Use   Smoking status: Never    Passive exposure: Past   Smokeless tobacco: Never  Vaping Use   Vaping status: Never Used  Substance and Sexual Activity   Alcohol use: No   Drug use: No   Sexual activity: Not on file  Other Topics Concern   Not on file  Social History Narrative   Has living will   Requests daughter [name] as his health care POA   Would accept brief attempt at resuscitation but no prolonged ventilation   No tube feeds if cognitively unaware   Social Drivers of Health   Financial Resource Strain: Low Risk  (07/28/2023)   Overall Financial Resource Strain (CARDIA)     Difficulty of Paying Living Expenses: Not very hard  Food Insecurity: No Food Insecurity (09/06/2023)   Hunger Vital Sign    Worried About Running Out of Food in the Last Year: Never true    Ran Out of Food in the Last Year: Never true  Transportation Needs: No Transportation Needs (09/06/2023)   PRAPARE - Administrator, Civil Service (Medical): No    Lack of Transportation (Non-Medical): No  Physical Activity: Insufficiently Active (07/28/2023)   Exercise Vital Sign    Days of Exercise per Week: 2 days    Minutes of Exercise per Session: 20 min  Stress: No Stress Concern Present (07/28/2023)   [employer] of Occupational Health - Occupational Stress Questionnaire    Feeling of Stress : Not at all  Social Connections: Socially Isolated (09/06/2023)   Social Connection and Isolation Panel [NHANES]    Frequency of Communication with Friends and Family: Three times a week    Frequency of Social Gatherings with Friends and Family: Twice a week    Attends Religious Services: Never    Database administrator or Organizations: No    Attends Banker Meetings: Never    Marital Status: Divorced  Catering manager Violence: Not At Risk (09/07/2023)   Humiliation, Afraid, Rape, and Kick questionnaire    Fear of Current or Ex-Partner: No    Emotionally Abused: No    Physically Abused: No    Sexually Abused: No   Review of Systems Loose stools--4 times yesterday (had been constipated in the hospital and needed MOM and senna) Some SOB Having some right arm numbness--gets better with  shaking it    Objective:   Physical Exam Constitutional:      Appearance: Normal appearance.  Cardiovascular:     Rate and Rhythm: Normal rate and regular rhythm.     Heart sounds: No murmur heard.    No gallop.  Pulmonary:     Effort: Pulmonary effort is normal.     Breath sounds: Normal breath sounds. No wheezing or rales.  Musculoskeletal:     Cervical back: Neck supple.   Lymphadenopathy:     Cervical: No cervical adenopathy.  Skin:    Comments: Large incision in right perineum---about 6 cm long (caudad to scrotum) and 4cm deep Minimal slough and not really inflamed  Neurological:     Mental Status: He is alert.            Assessment & Plan:

## 2023-09-16 NOTE — Assessment & Plan Note (Signed)
 Off lisinopril now and rarely takes the furosemide Needed lokelma for high potassium Will recheck this now

## 2023-09-16 NOTE — Telephone Encounter (Signed)
 Clydie Braun from [facility] called with critical High WBC 21.0  Called Dr Alphonsus Sias at home and advised him. He advised to call the Devin White to let him know that if he is still feeling bad, he needs to go back to the ER since it is going up instead of down.  Left detailed message on VM for Devin White and will call him back to make sure he got the message. Info written in Critical Book.

## 2023-09-16 NOTE — Assessment & Plan Note (Signed)
 Dressing changed her Packed with gauze --but hard to keep it in No pus and minimal slough now Infection seems to be controlled

## 2023-09-16 NOTE — Transitions of Care (Post Inpatient/ED Visit) (Signed)
   09/16/2023  Name: Devin White MRN: [id number] DOB: Mar 08, 1956  Today's TOC FU Call Status: Today's TOC FU Call Status:: Unsuccessful Call (2nd Attempt) Unsuccessful Call (1st Attempt) Date: 09/15/23 Unsuccessful Call (2nd Attempt) Date: 09/16/23  Attempted to reach the patient regarding the most recent Inpatient/ED visit.  Follow Up Plan: Additional outreach attempts will be made to reach the patient to complete the Transitions of Care (Post Inpatient/ED visit) call.   Deidre Ala, BSN, RN [place]  VBCI - [employer] [facility] RN Care Manager [phone]

## 2023-09-16 NOTE — Telephone Encounter (Signed)
 Spoke to pt in the office. HH is coming.

## 2023-09-16 NOTE — Telephone Encounter (Signed)
 Spoke to pt. He said he called back and was told about his results. He is going to [facility] ER.

## 2023-09-16 NOTE — Assessment & Plan Note (Addendum)
 Will recheck CBC--may need to extend antibiotics Is going back to surgeon Will extend augmentin and zyvox till sees surgeon

## 2023-09-17 ENCOUNTER — Telehealth: Payer: Self-pay

## 2023-09-17 ENCOUNTER — Encounter: Payer: Self-pay | Admitting: Internal Medicine

## 2023-09-17 NOTE — Transitions of Care (Post Inpatient/ED Visit) (Signed)
   09/17/2023  Name: Devin White MRN: [id number] DOB: 12/22/1955  Today's TOC FU Call Status: Today's TOC FU Call Status:: Successful TOC FU Call Completed TOC FU Call Complete Date: 09/17/23 Patient's Name and Date of Birth confirmed.  Transition Care Management Follow-up Telephone Call Date of Discharge: 10/15/23 Discharge Facility: [facility]) Type of Discharge: Inpatient Admission Primary Inpatient Discharge Diagnosis:: Sepsis, Perianal abscess How have you been since you were released from the hospital?: Worse (Patient stated he is currently in hospital agein, and was readmited via [facility] ED.)  Items Reviewed: Call was answered by patient, but had been readmitted to hospital at [facility] via the ED.     Medications Reviewed Today: Call was answered by patient, but had been readmitted to hospital at [facility] via the ED.  Medications Reviewed Today   Medications were not reviewed in this encounter     Home Care and Equipment/Supplies: NA    Functional Questionnaire: NA    Follow up appointments reviewed: NA    09/17/23: TOC RN CM post discharge outreach call was answered by patient, but patient stated he had been readmitted to hospital at [facility] via the ED on 09/16/23. Unable to complete follow up call due to readmission.     Gabriel Cirri MSN, RN RN Case Sales executive Health  VBCI-Population Health Office Hours Wed/Thur  8:00 am-6:00 pm Direct Dial: [phone] Main Phone [phone]  Fax: [phone] [email or web address]

## 2023-09-20 DIAGNOSIS — K610 Anal abscess: Secondary | ICD-10-CM | POA: Diagnosis not present

## 2023-09-20 DIAGNOSIS — D849 Immunodeficiency, unspecified: Secondary | ICD-10-CM | POA: Diagnosis not present

## 2023-09-20 DIAGNOSIS — Z944 Liver transplant status: Secondary | ICD-10-CM | POA: Diagnosis not present

## 2023-09-22 ENCOUNTER — Telehealth: Payer: Self-pay

## 2023-09-22 NOTE — Transitions of Care (Post Inpatient/ED Visit) (Signed)
   09/22/2023  Name: Devin White MRN: [id number] DOB: 12/27/1955  Today's TOC FU Call Status: Today's TOC FU Call Status:: Unsuccessful Call (1st Attempt) Unsuccessful Call (1st Attempt) Date: 09/22/23  Attempted to reach the patient regarding the most recent Inpatient/ED visit.  Follow Up Plan: Additional outreach attempts will be made to reach the patient to complete the Transitions of Care (Post Inpatient/ED visit) call.   Deidre Ala, BSN, RN [place]  VBCI - [employer] [facility] RN Care Manager [phone]

## 2023-09-23 ENCOUNTER — Telehealth: Payer: Self-pay | Admitting: *Deleted

## 2023-09-23 NOTE — Transitions of Care (Post Inpatient/ED Visit) (Addendum)
 09/23/2023  Name: Devin White MRN: [id number] DOB: April 04, 1956  Today's TOC FU Call Status: Today's TOC FU Call Status:: Successful TOC FU Call Completed TOC FU Call Complete Date: 09/23/23 Patient's Name and Date of Birth confirmed.  Transition Care Management Follow-up Telephone Call Date of Discharge: 09/21/23 Discharge Facility: Other ([facility]) Name of Other ([facility]) Discharge Facility: [facility] Type of Discharge: Inpatient Admission Primary Inpatient Discharge Diagnosis:: sepsis perianal abcess How have you been since you were released from the hospital?: Better Any questions or concerns?: No  Items Reviewed: Did you receive and understand the discharge instructions provided?: Yes Medications obtained,verified, and reconciled?: Yes (Medications Reviewed) Any new allergies since your discharge?: No Dietary orders reviewed?: Yes Type of Diet Ordered:: Diet carbohydrate level 2 Do you have support at home?: Yes People in Home [RPT]: alone Name of Support/Comfort Primary Source: grand daughter  Medications Reviewed Today: Medications Reviewed Today     Reviewed by Luella Cook, RN (Case Manager) on 09/23/23 at 1145  Med List Status: <None>   Medication Order Taking? Sig Documenting Provider Last Dose Status Informant  acetaminophen (TYLENOL) 325 MG tablet [id number] Yes Take 650 mg by mouth every 6 (six) hours as needed for mild pain. [provider] Taking Active Self  amLODipine (NORVASC) 10 MG tablet [id number] Yes Take 1 tablet (10 mg total) by mouth daily. Karie Schwalbe, MD Taking Active Self  amoxicillin-clavulanate (AUGMENTIN) 875-125 MG tablet [id number] Yes Take 1 tablet by mouth every 12 (twelve) hours for 8 days. Karie Schwalbe, MD Taking Active   aspirin EC 81 MG tablet [id number] Yes Take 1 tablet (81 mg total) by mouth daily. Swallow whole. Alford Highland, MD Taking Active Self  Blood Glucose Monitoring Suppl DEVI [id number] Yes 1  each by Does not apply route in the morning, at noon, and at bedtime. May substitute to any manufacturer covered by patient's insurance. Enedina Finner, MD Taking Active Self  calcium carbonate (TUMS) 500 MG chewable tablet [id number] Yes Chew 1 tablet (200 mg of elemental calcium total) by mouth 2 (two) times daily as needed for indigestion or heartburn. Joaquim Nam, MD Taking Active Self  colchicine 0.6 MG tablet [id number] Yes Take 1 tablet (0.6 mg total) by mouth daily as needed. Karie Schwalbe, MD Taking Active Self           Med Note Aundria Rud, Lezlie Octave Sep 07, 2023  8:44 AM) prn  Continuous Glucose Sensor (FREESTYLE LIBRE 3 PLUS SENSOR) [place] [id number] Yes Change sensor every 15 days. Karie Schwalbe, MD Taking Active Self  diclofenac Sodium (VOLTAREN) 1 % GEL [id number] Yes APPLY 4 GRAMS TOPICALLY TO THE AFFECTED AREA FOUR TIMES DAILY Karie Schwalbe, MD Taking Active Self  feeding supplement, GLUCERNA SHAKE, (GLUCERNA SHAKE) LIQD [id number] Yes Take 237 mLs by mouth 2 (two) times daily between meals. Enedina Finner, MD Taking Active Self  furosemide (LASIX) 40 MG tablet [id number]  TAKE 1 TABLET(40 MG) BY MOUTH DAILY Karie Schwalbe, MD  Active Self  gabapentin (NEURONTIN) 300 MG capsule [id number] Yes Take 1 capsule (300 mg total) by mouth 3 (three) times daily. Karie Schwalbe, MD Taking Active Self  glipiZIDE (GLUCOTROL XL) 2.5 MG 24 hr tablet [id number] Yes TAKE 1 TABLET(2.5 MG) BY MOUTH DAILY WITH BREAKFAST Karie Schwalbe, MD Taking Active Self  insulin glargine (LANTUS) 100 UNIT/ML Solostar Pen [id number] Yes Inject 30 Units into the skin daily. Karie Schwalbe, MD Taking Active Self  insulin lispro (HUMALOG KWIKPEN) 100 UNIT/ML KwikPen [id number] Yes 0-15 Units, Subcutaneous, 3 times daily before meals & bedtime  CBG 70 - 120: 0 units  CBG 121 - 150: 2 units  CBG 151 - 200: 3 units  CBG 201 - 250: 5 units  CBG 251 - 300: 8 units  CBG 301 - 350: 11 units  CBG 351 - 400:  15 units  CBG > 400: call MD Enedina Finner, MD Taking Active Self  Insulin Pen Needle 32G X 4 MM MISC [id number]  1 each by Does not apply route as directed. Karie Schwalbe, MD  Active Self  linezolid (ZYVOX) 600 MG tablet [id number]  Take 1 tablet (600 mg total) by mouth 2 (two) times daily for 8 days. Karie Schwalbe, MD  Active   lisinopril (ZESTRIL) 20 MG tablet [id number]  Take 20 mg by mouth daily. [provider]  Active Self  methocarbamol (ROBAXIN) 500 MG tablet [id number] Yes Take by mouth 2 (two) times daily. [provider] Taking Active Self  Multiple Vitamin (MULTIVITAMIN WITH MINERALS) TABS tablet [id number] Yes Take 1 tablet by mouth daily. Kathlen Mody, MD Taking Active Self  mycophenolate (CELLCEPT) 250 MG capsule [id number] Yes Take 250-500 mg by mouth 2 (two) times daily. 2 capsules (500 mg) in the morning and one capsule (250 mg) in the evening [provider] Taking Active Self  nortriptyline (PAMELOR) 10 MG capsule [id number] Yes Take 10 mg by mouth 2 (two) times daily. [provider] Taking Active Self  omeprazole (PRILOSEC) 20 MG capsule [id number] Yes TAKE 1 CAPSULE(20 MG) BY MOUTH DAILY Karie Schwalbe, MD Taking Active Self  oxyCODONE (OXY IR/ROXICODONE) 5 MG immediate release tablet [id number] Yes Take 1-2 tablets (5-10 mg total) by mouth every 4 (four) hours as needed for moderate pain (pain score 4-6) or severe pain (pain score 7-10). Jonah Blue, MD Taking Active   predniSONE (DELTASONE) 10 MG tablet [id number] Yes Take 10 mg by mouth daily with breakfast. [provider] Taking Active Self  rosuvastatin (CRESTOR) 20 MG tablet [id number] Yes Take 1 tablet (20 mg total) by mouth daily. Karie Schwalbe, MD Taking Active Self  senna-docusate (SENOKOT-S) 8.6-50 MG tablet [id number] Yes Take 1 tablet by mouth 2 (two) times daily. Jonah Blue, MD Taking Active   tacrolimus (PROGRAF) 0.5 MG capsule [id number] Yes Take 0.5-1 mg by  mouth See admin instructions. Taking 0.5 mg in the morning and 1 mg at bedtime [provider] Taking Active Self  tamsulosin (FLOMAX) 0.4 MG CAPS capsule [id number] Yes TAKE 1 CAPSULE(0.4 MG) BY MOUTH DAILY Karie Schwalbe, MD Taking Active Self  trimethoprim (TRIMPEX) 100 MG tablet [id number] Yes Take 100 mg by mouth daily. [provider] Taking Active Self            Home Care and Equipment/Supplies: Were Home Health Services Ordered?: NA Any new equipment or medical supplies ordered?: NA  Functional Questionnaire: Do you need assistance with bathing/showering or dressing?: No Do you need assistance with meal preparation?: No Do you need assistance with eating?: No Do you have difficulty maintaining continence: No Do you need assistance with getting out of bed/getting out of a chair/moving?: No Do you have difficulty managing or taking your medications?: No  Follow up appointments reviewed: PCP Follow-up appointment confirmed?: Yes Date of PCP follow-up appointment?: 09/26/23 Follow-up Provider: Dr Alphonsus Sias [facility] Follow-up appointment confirmed?: Yes Date of Specialist follow-up appointment?: 11/03/23 Follow-Up Specialty Provider:: Cornelia Copa, [id number] Janey Genta  Ardyth Harps Do you need transportation to your follow-up appointment?: No Do you understand care options if your condition(s) worsen?: Yes-patient verbalized understanding  Goals Addressed             This Visit's Progress    [facility]BCI Transitions of Care (TOC) Care Plan       Problems:  Recent Hospitalization for treatment of  sepsis Equipment/DME barrier Patient needs a shower bench and a scale. He is going to order today.   Goal:  Over the next 30 days, the patient will not experience hospital readmission  Interventions:  Transitions of Care:  New goal. Doctor Visits  - discussed the importance of doctor visits Arranged PCP follow-up within 7 days (Care Guide  Scheduled) Post-op wound/incision care reviewed with patient/caregiver  Patient Self Care Activities:  Attend all scheduled provider appointments Call pharmacy for medication refills 3-7 days in advance of running out of medications Call provider office for new concerns or questions  Participate in Transition of Care Program/Attend TOC scheduled calls Perform all self care activities independently  Take medications as prescribed    Plan:  An initial telephone outreach has been scheduled for:  Next PCP appointment scheduled for: 09/26/2023       Patient consent to follow up appointment with Griffin Basil on 09/30/2023 11:00  Gean Maidens BSN RN [facility] Population Health Care Management Coordinator [email or web address] Direct Dial: [phone]  Fax: [phone] Website: [email or web address]

## 2023-09-26 ENCOUNTER — Encounter: Payer: Self-pay | Admitting: Internal Medicine

## 2023-09-26 ENCOUNTER — Ambulatory Visit: Admitting: Internal Medicine

## 2023-09-26 VITALS — BP 92/60 | HR 111 | Temp 97.7°F | Ht 73.0 in | Wt 256.0 lb

## 2023-09-26 DIAGNOSIS — I959 Hypotension, unspecified: Secondary | ICD-10-CM | POA: Diagnosis not present

## 2023-09-26 DIAGNOSIS — Z944 Liver transplant status: Secondary | ICD-10-CM | POA: Diagnosis not present

## 2023-09-26 DIAGNOSIS — E1165 Type 2 diabetes mellitus with hyperglycemia: Secondary | ICD-10-CM | POA: Diagnosis not present

## 2023-09-26 DIAGNOSIS — R911 Solitary pulmonary nodule: Secondary | ICD-10-CM | POA: Diagnosis not present

## 2023-09-26 DIAGNOSIS — H04123 Dry eye syndrome of bilateral lacrimal glands: Secondary | ICD-10-CM | POA: Diagnosis not present

## 2023-09-26 DIAGNOSIS — Z87440 Personal history of urinary (tract) infections: Secondary | ICD-10-CM | POA: Diagnosis not present

## 2023-09-26 DIAGNOSIS — Z0000 Encounter for general adult medical examination without abnormal findings: Secondary | ICD-10-CM | POA: Diagnosis not present

## 2023-09-26 DIAGNOSIS — M7989 Other specified soft tissue disorders: Secondary | ICD-10-CM

## 2023-09-26 DIAGNOSIS — N179 Acute kidney failure, unspecified: Secondary | ICD-10-CM | POA: Diagnosis not present

## 2023-09-26 DIAGNOSIS — Z2821 Immunization not carried out because of patient refusal: Secondary | ICD-10-CM | POA: Diagnosis not present

## 2023-09-26 DIAGNOSIS — Z794 Long term (current) use of insulin: Secondary | ICD-10-CM | POA: Diagnosis not present

## 2023-09-26 DIAGNOSIS — Z881 Allergy status to other antibiotic agents status: Secondary | ICD-10-CM | POA: Diagnosis not present

## 2023-09-26 DIAGNOSIS — E1140 Type 2 diabetes mellitus with diabetic neuropathy, unspecified: Secondary | ICD-10-CM | POA: Diagnosis not present

## 2023-09-26 DIAGNOSIS — I5032 Chronic diastolic (congestive) heart failure: Secondary | ICD-10-CM | POA: Diagnosis not present

## 2023-09-26 DIAGNOSIS — Z1152 Encounter for screening for COVID-19: Secondary | ICD-10-CM | POA: Diagnosis not present

## 2023-09-26 DIAGNOSIS — D84821 Immunodeficiency due to drugs: Secondary | ICD-10-CM | POA: Diagnosis not present

## 2023-09-26 DIAGNOSIS — I700 Atherosclerosis of aorta: Secondary | ICD-10-CM | POA: Diagnosis not present

## 2023-09-26 DIAGNOSIS — R221 Localized swelling, mass and lump, neck: Secondary | ICD-10-CM | POA: Diagnosis not present

## 2023-09-26 DIAGNOSIS — D72828 Other elevated white blood cell count: Secondary | ICD-10-CM | POA: Diagnosis not present

## 2023-09-26 DIAGNOSIS — Z8673 Personal history of transient ischemic attack (TIA), and cerebral infarction without residual deficits: Secondary | ICD-10-CM | POA: Diagnosis not present

## 2023-09-26 DIAGNOSIS — D72829 Elevated white blood cell count, unspecified: Secondary | ICD-10-CM | POA: Diagnosis not present

## 2023-09-26 DIAGNOSIS — E872 Acidosis, unspecified: Secondary | ICD-10-CM | POA: Diagnosis not present

## 2023-09-26 DIAGNOSIS — N200 Calculus of kidney: Secondary | ICD-10-CM | POA: Diagnosis not present

## 2023-09-26 DIAGNOSIS — K219 Gastro-esophageal reflux disease without esophagitis: Secondary | ICD-10-CM | POA: Diagnosis not present

## 2023-09-26 DIAGNOSIS — E875 Hyperkalemia: Secondary | ICD-10-CM | POA: Diagnosis not present

## 2023-09-26 DIAGNOSIS — E1122 Type 2 diabetes mellitus with diabetic chronic kidney disease: Secondary | ICD-10-CM | POA: Diagnosis not present

## 2023-09-26 DIAGNOSIS — I130 Hypertensive heart and chronic kidney disease with heart failure and stage 1 through stage 4 chronic kidney disease, or unspecified chronic kidney disease: Secondary | ICD-10-CM | POA: Diagnosis not present

## 2023-09-26 DIAGNOSIS — N183 Chronic kidney disease, stage 3 unspecified: Secondary | ICD-10-CM | POA: Diagnosis not present

## 2023-09-26 DIAGNOSIS — H3581 Retinal edema: Secondary | ICD-10-CM | POA: Diagnosis not present

## 2023-09-26 DIAGNOSIS — M1A9XX1 Chronic gout, unspecified, with tophus (tophi): Secondary | ICD-10-CM | POA: Diagnosis not present

## 2023-09-26 DIAGNOSIS — Z792 Long term (current) use of antibiotics: Secondary | ICD-10-CM | POA: Diagnosis not present

## 2023-09-26 DIAGNOSIS — K611 Rectal abscess: Secondary | ICD-10-CM | POA: Diagnosis not present

## 2023-09-26 DIAGNOSIS — H538 Other visual disturbances: Secondary | ICD-10-CM | POA: Diagnosis not present

## 2023-09-26 DIAGNOSIS — N3289 Other specified disorders of bladder: Secondary | ICD-10-CM | POA: Diagnosis not present

## 2023-09-26 DIAGNOSIS — L720 Epidermal cyst: Secondary | ICD-10-CM | POA: Diagnosis not present

## 2023-09-26 NOTE — Assessment & Plan Note (Addendum)
 Initially Fournier's gangrene anteriorly--this area is still very swollen Then second perianal abscess drained last week Home on same oral antibiotics and clinical status has worsened over the 5 days since he has been home [facility] requested CBC to check WBC again---but he is feeling worse day by day and still with significant drainage from penrose. Told him I feel he has failed outpatient Rx--so the WBC doesn't matter Advised him to head back to [facility] ER for reevaluation  He may need prolonged IV antibiotics and even daily nursing care for the wounds

## 2023-09-26 NOTE — Progress Notes (Signed)
 Subjective:    Patient ID: Devin White, male    DOB: [birth year], 68 y.o.   MRN: [id number]  HPI Here for hospital follow up  Seen here after hospitalization at [facility] Pelvic abscess drained but still not feeling right WBC elevated--so went to ED at [facility] and was admitted 4/1 Initially hypotensive in ER--needed fluid resuscitation Perirectal abscess noted on CT scan Was taken to the OR and fibrinous material found in the abscess cavity---and send to lab The Fournier's gangrene area anteriorly appeared to be tracking to the posterior perianal abscess cavity. These were connected and drain inserted No clear fistula found Was send home to continue zyvox and augmentin that he was already on--and with drain still in place Sent home on lower prednisone 7.5mg  daily with monthly wean-- home on 4/6  Not feeling well Sweats the last 3 nights Still having drainage from wound  Hasn't felt good ---even when in the hospital Some nausea with the antibiotics Still dizzy, tired and weak  Pain is not bad---but it is sore and some pain there Taking tylenol around the clock Has been feeling worse as the week went on---more nausea, hot feeling, etc  Tissue cultures from [facility]---2 were negative, 1 had mixed gram negative and positive in broth only (also yeast)  Current Outpatient Medications on File Prior to Visit  Medication Sig Dispense Refill   acetaminophen (TYLENOL) 325 MG tablet Take 650 mg by mouth every 6 (six) hours as needed for mild pain.     amLODipine (NORVASC) 10 MG tablet Take 1 tablet (10 mg total) by mouth daily. 90 tablet 3   amoxicillin-clavulanate (AUGMENTIN) 875-125 MG tablet Take 1 tablet by mouth 2 (two) times daily.     aspirin EC 81 MG tablet Take 1 tablet (81 mg total) by mouth daily. Swallow whole. 30 tablet 0   Blood Glucose Monitoring Suppl DEVI 1 each by Does not apply route in the morning, at noon, and at bedtime. May substitute to any manufacturer covered by  patient's insurance. 1 each 0   calcium carbonate (TUMS) 500 MG chewable tablet Chew 1 tablet (200 mg of elemental calcium total) by mouth 2 (two) times daily as needed for indigestion or heartburn. 180 tablet 1   colchicine 0.6 MG tablet Take 1 tablet (0.6 mg total) by mouth daily as needed. 30 tablet 1   Continuous Glucose Sensor (FREESTYLE LIBRE 3 PLUS SENSOR) MISC Change sensor every 15 days. 2 each 12   diclofenac Sodium (VOLTAREN) 1 % GEL APPLY 4 GRAMS TOPICALLY TO THE AFFECTED AREA FOUR TIMES DAILY 100 g 5   feeding supplement, GLUCERNA SHAKE, (GLUCERNA SHAKE) LIQD Take 237 mLs by mouth 2 (two) times daily between meals. 237 mL 0   [Paused] furosemide (LASIX) 40 MG tablet TAKE 1 TABLET(40 MG) BY MOUTH DAILY 90 tablet 3   gabapentin (NEURONTIN) 300 MG capsule Take 1 capsule (300 mg total) by mouth 3 (three) times daily. 270 capsule 3   glipiZIDE (GLUCOTROL XL) 2.5 MG 24 hr tablet TAKE 1 TABLET(2.5 MG) BY MOUTH DAILY WITH BREAKFAST 90 tablet 0   insulin glargine (LANTUS) 100 UNIT/ML Solostar Pen Inject 30 Units into the skin daily. 1 mL 0   insulin lispro (HUMALOG KWIKPEN) 100 UNIT/ML KwikPen 0-15 Units, Subcutaneous, 3 times daily before meals & bedtime  CBG 70 - 120: 0 units  CBG 121 - 150: 2 units  CBG 151 - 200: 3 units  CBG 201 - 250: 5 units  CBG 251 -  300: 8 units  CBG 301 - 350: 11 units  CBG 351 - 400: 15 units  CBG > 400: call MD 15 mL 11   Insulin Pen Needle 32G X 4 MM MISC 1 each by Does not apply route as directed. 200 each 11   linezolid (ZYVOX) 600 MG tablet Take 600 mg by mouth 2 (two) times daily.     [Paused] lisinopril (ZESTRIL) 20 MG tablet Take 20 mg by mouth daily.     methocarbamol (ROBAXIN) 500 MG tablet Take by mouth 2 (two) times daily.     Multiple Vitamin (MULTIVITAMIN WITH MINERALS) TABS tablet Take 1 tablet by mouth daily.     mycophenolate (CELLCEPT) 250 MG capsule Take 250-500 mg by mouth 2 (two) times daily. 2 capsules (500 mg) in the morning and one  capsule (250 mg) in the evening     nortriptyline (PAMELOR) 10 MG capsule Take 10 mg by mouth 2 (two) times daily.     omeprazole (PRILOSEC) 20 MG capsule TAKE 1 CAPSULE(20 MG) BY MOUTH DAILY 30 capsule 11   oxyCODONE (OXY IR/ROXICODONE) 5 MG immediate release tablet Take 1-2 tablets (5-10 mg total) by mouth every 4 (four) hours as needed for moderate pain (pain score 4-6) or severe pain (pain score 7-10). 30 tablet 0   predniSONE (DELTASONE) 10 MG tablet Take 7.5 mg by mouth daily with breakfast.     rosuvastatin (CRESTOR) 20 MG tablet Take 1 tablet (20 mg total) by mouth daily. 90 tablet 3   senna-docusate (SENOKOT-S) 8.6-50 MG tablet Take 1 tablet by mouth 2 (two) times daily. 60 tablet 0   tacrolimus (PROGRAF) 0.5 MG capsule Take 0.5-1 mg by mouth See admin instructions. Taking 0.5 mg in the morning and 1 mg at bedtime     tamsulosin (FLOMAX) 0.4 MG CAPS capsule TAKE 1 CAPSULE(0.4 MG) BY MOUTH DAILY 90 capsule 3   trimethoprim (TRIMPEX) 100 MG tablet Take 100 mg by mouth daily.     No current facility-administered medications on file prior to visit.    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   History of cirrhosis of liver S/P TRANSPLANT 2009   History of liver failure S/P TRANSPLANT   Hypertension    Left ureteral calculus    Renal disorder    Stroke [facility])     Past Surgical History:  Procedure Laterality Date   APPENDECTOMY     bone morrow biopsy     CHOLECYSTECTOMY  2007   CYSTO/ LEFT RETROGRADE PYELOGRAM/ LEFT URETERAL STENT PLACEMENT  03-05-2012  DR Berneice Heinrich [facility])   LEFT URETERAL CALCULI   CYSTOSCOPY W/ URETERAL  STENT PLACEMENT  03/11/2012   Procedure: CYSTOSCOPY WITH STENT REPLACEMENT;  Surgeon: Sebastian Ache, MD;  Location: [facility];  Service: Urology;  Laterality: Left;   HERNIA REPAIR  2008   INCISION AND DRAINAGE ABSCESS N/A 09/09/2023   Procedure: INCISION AND DRAINAGE, ABSCESS;  Surgeon: Leafy Ro, MD;  Location: [facility] ORS;  Service: General;  Laterality: N/A;  Lithotomy   LIVER BIOPSY  LIVER TRANSPLANT  11/24/2007   pt states doing well since liver transplant   LUMBAR DISC SURGERY     LUMBAR FUSION     removal of fistula     URETEROSCOPY  03/11/2012   Procedure: URETEROSCOPY;  Surgeon: Sebastian Ache, MD;  Location: [facility];  Service: Urology;  Laterality: Left;   STONE MANIPULATION, stone obtained  [phone] [employer] MCR    Family History  Problem Relation Age of Onset   Cancer Mother        colon   Arthritis Mother    Vasculitis Mother    Hypertension Mother    Cancer Father        colon   Kidney disease Father    Arthritis Father    Arthritis Brother    Alcohol abuse Maternal Aunt    Diabetes Maternal Aunt    Alcohol abuse Maternal Uncle    Diabetes Paternal Aunt    Alcohol abuse Maternal Uncle    Alcohol abuse Maternal Uncle     Social History   Socioeconomic History   Marital status: Single    Spouse name: Not on file   Number of children: 3   Years of education: Not on file   Highest education level: 9th grade  Occupational History   Occupation: disabled    Employer: RETIRED  Tobacco Use   Smoking status: Never    Passive exposure: Past   Smokeless tobacco: Never  Vaping Use   Vaping status: Never Used  Substance and Sexual Activity   Alcohol use: No   Drug use: No   Sexual activity: Not on file  Other Topics Concern   Not on file  Social History Narrative   Has living will   Requests daughter [name] as his health care POA   Would accept brief attempt at resuscitation but no prolonged ventilation   No tube  feeds if cognitively unaware   Social Drivers of Health   Financial Resource Strain: Low Risk  (09/17/2023)   Received from [facility]   Overall Financial Resource Strain (CARDIA)    Difficulty of Paying Living Expenses: Not very hard  Food Insecurity: No Food Insecurity (09/17/2023)   Received from [facility]   Hunger Vital Sign    Worried About Running Out of Food in the Last Year: Never true    Ran Out of Food in the Last Year: Never true  Transportation Needs: No Transportation Needs (09/17/2023)   Received from [facility] - Transportation    In the past 12 months, has lack of transportation kept you from medical appointments or from getting medications?: No    Lack of Transportation (Non-Medical): No  Physical Activity: Insufficiently Active (07/28/2023)   Exercise Vital Sign    Days of Exercise per Week: 2 days    Minutes of Exercise per Session: 20 min  Stress: No Stress Concern Present (07/28/2023)   [employer] of Occupational Health - Occupational Stress Questionnaire    Feeling of Stress : Not at all  Social Connections: Socially Isolated (09/06/2023)   Social Connection and Isolation Panel [NHANES]    Frequency of Communication with Friends and Family: Three times a week    Frequency of Social Gatherings with Friends and Family: Twice a week    Attends Religious Services: Never    Database administrator or Organizations: No    Attends Banker Meetings: Never    Marital Status:  Divorced  Intimate Partner Violence: Not At Risk (09/07/2023)   Humiliation, Afraid, Rape, and Kick questionnaire    Fear of Current or Ex-Partner: No    Emotionally Abused: No    Physically Abused: No    Sexually Abused: No   Review of Systems Not eating well--has lost 12-13# over this 3 week illness Woke with "crick in neck" 4 days ago---finally some better Off mycophenolate due to the antibiotics      Objective:   Physical Exam Constitutional:      General: He is not in acute distress. Neurological:     Mental Status: He is alert.            Assessment & Plan:

## 2023-09-27 DIAGNOSIS — Z794 Long term (current) use of insulin: Secondary | ICD-10-CM | POA: Diagnosis not present

## 2023-09-27 DIAGNOSIS — E872 Acidosis, unspecified: Secondary | ICD-10-CM | POA: Diagnosis not present

## 2023-09-27 DIAGNOSIS — E1165 Type 2 diabetes mellitus with hyperglycemia: Secondary | ICD-10-CM | POA: Diagnosis not present

## 2023-09-27 DIAGNOSIS — D72828 Other elevated white blood cell count: Secondary | ICD-10-CM | POA: Diagnosis not present

## 2023-09-27 DIAGNOSIS — K611 Rectal abscess: Secondary | ICD-10-CM | POA: Diagnosis not present

## 2023-09-27 DIAGNOSIS — I959 Hypotension, unspecified: Secondary | ICD-10-CM | POA: Diagnosis not present

## 2023-09-28 DIAGNOSIS — K611 Rectal abscess: Secondary | ICD-10-CM | POA: Diagnosis not present

## 2023-09-28 DIAGNOSIS — E1165 Type 2 diabetes mellitus with hyperglycemia: Secondary | ICD-10-CM | POA: Diagnosis not present

## 2023-09-28 DIAGNOSIS — I959 Hypotension, unspecified: Secondary | ICD-10-CM | POA: Diagnosis not present

## 2023-09-28 DIAGNOSIS — D72828 Other elevated white blood cell count: Secondary | ICD-10-CM | POA: Diagnosis not present

## 2023-09-28 DIAGNOSIS — E872 Acidosis, unspecified: Secondary | ICD-10-CM | POA: Diagnosis not present

## 2023-09-29 DIAGNOSIS — K611 Rectal abscess: Secondary | ICD-10-CM | POA: Diagnosis not present

## 2023-09-29 DIAGNOSIS — E1165 Type 2 diabetes mellitus with hyperglycemia: Secondary | ICD-10-CM | POA: Diagnosis not present

## 2023-09-29 DIAGNOSIS — H04123 Dry eye syndrome of bilateral lacrimal glands: Secondary | ICD-10-CM | POA: Diagnosis not present

## 2023-09-29 DIAGNOSIS — D72828 Other elevated white blood cell count: Secondary | ICD-10-CM | POA: Diagnosis not present

## 2023-09-29 DIAGNOSIS — H3581 Retinal edema: Secondary | ICD-10-CM | POA: Diagnosis not present

## 2023-09-30 ENCOUNTER — Ambulatory Visit: Admit: 2023-09-30 | Admitting: Ophthalmology

## 2023-09-30 ENCOUNTER — Other Ambulatory Visit: Payer: Self-pay

## 2023-09-30 DIAGNOSIS — K611 Rectal abscess: Secondary | ICD-10-CM | POA: Diagnosis not present

## 2023-09-30 DIAGNOSIS — R221 Localized swelling, mass and lump, neck: Secondary | ICD-10-CM | POA: Diagnosis not present

## 2023-09-30 DIAGNOSIS — I959 Hypotension, unspecified: Secondary | ICD-10-CM | POA: Diagnosis not present

## 2023-09-30 SURGERY — PHACOEMULSIFICATION, CATARACT, WITH IOL INSERTION
Anesthesia: Topical | Laterality: Left

## 2023-09-30 NOTE — Transitions of Care (Post Inpatient/ED Visit) (Signed)
 Care Management  Transitions of Care Program Transitions of Care Post-discharge week 2  09/30/2023 Name: Devin White MRN: [id number] DOB: 04-29-1956  Subjective: Devin White is a 68 y.o. year old male who is a primary care patient of Helaine Llanos, MD. The Care Management team was unable to reach the patient by phone to assess and address transitions of care needs.   Plan: No further outreach attempts will be made at this time.  We have been unable to reach the patient.  The patient is Hospitalized  Gareld June, BSN, RN [place]  [facility]BC[facility] - Population Health RN Care Manager [phone]

## 2023-10-01 DIAGNOSIS — I959 Hypotension, unspecified: Secondary | ICD-10-CM | POA: Diagnosis not present

## 2023-10-01 DIAGNOSIS — L720 Epidermal cyst: Secondary | ICD-10-CM | POA: Diagnosis not present

## 2023-10-01 DIAGNOSIS — K611 Rectal abscess: Secondary | ICD-10-CM | POA: Diagnosis not present

## 2023-10-01 DIAGNOSIS — R221 Localized swelling, mass and lump, neck: Secondary | ICD-10-CM | POA: Diagnosis not present

## 2023-10-01 NOTE — Discharge Summary (Addendum)
 [facility] Medicine Discharge Summary  Admit Date: 09/26/2023 Discharge Date: 10/03/2023  5:12 PM  Admitting Physician: Lorane Trenda Pih, MD Discharge Physician: Virl Holmes Sprang, MD  Primary Care Provider: Jimmy Charlie FERNS, MD, Phone [phone]  Discharge Destination: Home  Admission Diagnoses:  Peri-rectal abscess [K61.1] Other elevated white blood cell (WBC) count [D72.828] Type 2 diabetes mellitus with hyperglycemia, with long-term current use of insulin  (CMS/HHS-HCC) [E11.65, Z79.4]  Discharge Diagnoses:  Principal Problem:   Fever and chills, unspecified Active Problems:   Liver replaced by transplant (CMS/HHS-HCC)   Immunosuppression , unspecified (CMS-HCC)   Essential hypertension   Acute kidney injury superimposed on CKD ()   Leukocytosis   Lactic acidosis Resolved Problems:   * No resolved hospital problems. *  Primary Diagnosis: Admitted for fevers, chills and hypotension  Changes Made (with rationale):  Discontinued antibiotics per ID guidance due to negative cultures, normal inflammatory markers and and well-appearing surgical wound.  To-Do List (incidental findings, follow-up studies, etc.): Repeat CT Chest in 3-6 months to follow-up new nodular right lower lobe opacity.   Anticipatory Guidance for Outpatient Care:  MMF continued to be held at discharge per transplant hepatology recs, recommend further follow-up outpatient with re-initiation once appropriate.  Patient required minimal insulin  while inpatient, continue to monitor blood glucoses as steroid wean continues    Results Pending at Discharge:  None Please see phone numbers at end of this summary for lab contact information.   Follow-up/Care Transition Plan: Sched. appts: Future Appointments  Date Time Provider Department Center  10/02/2023 11:30 AM Estelle Lemond Slice, NP [facility]  11/03/2023  9:20 AM Nola Tobias Brunt, MD [facility]  11/05/2023 11:00 AM Brendia Calton Squires, PA [facility]  02/10/2024  1:00 PM Manuelita Harlene Houston, NP [facility]  02/17/2024 11:00 AM Gwynn, Rea Base, NP [facility]    Follow-up info: [facility], [place] [address] [place]  [address] [phone]    Jimmy Charlie FERNS, MD [address] [place] [address] [phone]     Jimmy Charlie FERNS, MD [address] [place] [address] [phone]         Allergies/Intolerances:  Allergies  Allergen Reactions  . Levofloxacin  Shortness Of Breath    SOB and severe leg pain/weakness     New Adverse Drug Events: none  Medications:   Current Discharge Medication List     CONTINUE these medications which have CHANGED   Details  insulin  LISPRO (HUMALOG  KWIKPEN) pen injector (concentration 100 units/mL) Please take your lantus  as directed by your PCP per the chart you are following at home Qty: 15 mL, Refills: 0    LANTUS  SOLOSTAR U-100 INSULIN  pen injector (concentration 100 units/mL) Please take your lantus  as directed by your PCP per the chart you are following at home Qty: 30 mL, Refills: 0    mycophenolate  (CELLCEPT ) 250 mg capsule DO NOT TAKE THIS MEDICATION UNTIL YOU FOLLOW UP WITH YOUR TRANSPLANT DOCTOR.  TAKE 2 CAPSULES BY MOUTH IN THE MORNING AND 1 CAPSULE IN THE EVENING.       CONTINUE these medications which have NOT CHANGED   Details  amLODIPine  (NORVASC ) 5 MG tablet Take 1 tablet (5 mg total) by mouth once daily Qty: 30 tablet, Refills: 0    aspirin  81 mg Cap Take 81 mg by mouth once daily    calcium  carbonate 500 mg calcium  (1,250 mg) chewable tablet Take 1 tablet (  1,250 mg of salt total) by mouth once daily as needed    gabapentin  (NEURONTIN ) 300 MG capsule Take 1 capsule (300 mg total) by mouth 3 (three) times daily Qty: 90 capsule, Refills: 1   Associated Diagnoses: Left lumbar  radiculitis; Numbness and tingling of left arm and leg    multivitamin with minerals tablet Take 1 tablet by mouth once daily    nortriptyline  (PAMELOR ) 10 MG capsule Take 10 mg nightly for one week, then increase to 20 mg nightly for headaches. Qty: 60 capsule, Refills: 1    omeprazole  (PRILOSEC) 20 MG DR capsule Take 1 capsule (20 mg total) by mouth once daily. Qty: 60 capsule, Refills: 0    polyethylene glycol (MIRALAX ) packet Take 17 g by mouth once daily as needed    predniSONE  (DELTASONE ) 2.5 MG tablet Take 3 tablets (7.5 mg total) by mouth once daily for 30 days, THEN 2 tablets (5 mg total) once daily for 30 days, THEN 1 tablet (2.5 mg total) once daily for 30 days. Qty: 180 tablet, Refills: 0    rosuvastatin  (CRESTOR ) 20 MG tablet Take 20 mg by mouth once daily    tacrolimus  (PROGRAF ) 0.5 MG capsule TAKE 1 CAPSULE BY MOUTH EVERY MORNING, AND 2 CAPSULES AT BEDTIME Qty: 270 capsule, Refills: 3   Associated Diagnoses: Immunosuppression (CMS/HHS-HCC); Liver replaced by transplant (CMS/HHS-HCC)    acetaminophen  (TYLENOL ) 325 MG tablet Take 2 tablets (650 mg total) by mouth every 6 (six) hours as needed for Pain or Fever    blood-glucose sensor (FREESTYLE LIBRE 3 PLUS SENSOR) Devi Inject 1 each subcutaneously once daily    FUROsemide  (LASIX ) 40 MG tablet Take 1 tablet (40 mg total) by mouth once daily as needed for Edema (Swelling of belly, legs, or shortness of breath) Take 1 tablet (40 mg total) by mouth daily if you feel increased shortness of breath or increased swelling of legs. If you have continued shortness of breath or chest tightness, immediately seek medical care. Qty: 30 tablet, Refills: 0    glucagon (GVOKE HYPOPEN 2-PACK) 1 mg/0.2 mL auto-injector Inject 0.2 mLs (1 mg total) subcutaneously as directed for Low blood sugar Qty: 0.4 mL, Refills: 1    lisinopriL  (ZESTRIL ) 20 MG tablet Take 40 mg by mouth once daily    sennosides (SENOKOT) 8.6 mg tablet Take 2 tablets by  mouth once daily as needed    tamsulosin  (FLOMAX ) 0.4 mg capsule Take 1 capsule by mouth every morning Refills: 11       STOP taking these medications     amoxicillin -clavulanate (AUGMENTIN ) 875-125 mg tablet      linezolid  (ZYVOX ) 600 mg tablet      sennosides-docusate (SENOKOT-S) 8.6-50 mg tablet      topiramate  (TOPAMAX ) 50 MG tablet          Brief History of Present Illness:  Devin White is a 68 y.o. male with a history of MASLD cirrhosis status s/p OLT 2009 (CMV D+/R-, EBV D+/R-) C/B atypical rejection versus [place] AIH, CKD stage III, HFpEF, insulin -dependent type 2 DM (A1c 6.7% 07/2023), GERD, BPH, HTN, prior CVA, polyarticular tophaceous gout, chronic diarrhea, and prior SMV thrombus s/p 6 months of DOAC (ended 05/2021) who presents with fever, chills, and hypotension in the setting of recent perirectal abscess with Penrose drain in place and prior surgical intervention for necrotizing SSTI on 09/09/2023.   Patient with multiple hospitalizations over the past month for complications related to initial hospitalization at OSH from 3/22-3/30 with sepsis secondary  to Fournier's gangrene s/p debridement with initial culture positive for Staphylococcus hemolyticus, Streptococcus constellatus, and Prevotella.  He was treated with Vanco Zosyn  and discharged on p.o. linezolid  and Augmentin  with a planned course of 14 days.   Is more recently admitted on 4/1 with significant leukocytosis on outpatient labs.  At that time, he he was found to have a perianal abscess with a small amount of fibrous material.  He was taken to the OR with Dr. Christena for surgical debridement and drainage.  The previous area of Fournier's gangrene appeared to be tracking posterior to the perianal abscess cavity with active communicating tract.  A Penrose drain was placed and patient was ultimately discharged on linezolid  and Augmentin .   Patient reports that he has been having increased frequency of chills,  night sweats, and lightheadedness over the past 2 days.  He initially was trying to sleep it off, but symptoms continue to worsen and he began to develop nausea and emesis.  He does report several loose stools over the course of the day.  He went to his primary care doctor for initial evaluation who recommended immediate presentation to the hospital for further management.   In the ED, temperature was (!) 35.6 C (96.1 F), HR 101, BP (!) 81/62, RR 18, and SpO2 97 % on RA.  Patient was transferred to the resuscitation bay and given 1.5 L LR wide open and started on norepinephrine for septic shock.  Patient was recultured and started on Vanco and Zosyn  for empiric coverage.  Labs were notable for leukocytes 8.8, hemoglobin 11.7*, platelets 189, sodium 141, potassium 4.8, magnesium  2.2, calcium  8.7, bicarb 21, creatinine 2.2*, anion gap 12, AST 16, ALT 25, Alk Phos 35, albumin 2.8*, INR 0.9, PTT 26.5*, and A1c  %. EKG showed NSR with infrequent PACs and LVH, CXR showed no focal consolidation or acute abnormalities, and CT A/P showed appropriate placement of Penrose drain within perineum with similar tract extending superiorly from perineum to the anal canal suggestive of perianal fistulous disease.  Additionally, diffuse bladder wall thickening was seen and nodular right lower lobe opacity was redemonstrated from prior CT on 4/1 . He was admitted for further evaluation and management. _____________________   Hospital Course by Problem:  #Nausea and vomiting, resolved #Hypotension, resolved #Concern for recurrent infection of unclear source, sepsis ruled out, SIRS criteria only due to non-infectious source  #Recent Fournier's gangrene c/b perianal abscess s/p washout x 2 #Sepsis 2/2 unclear etiology,  resolved Mr. Rowe presented with dizziness, night sweats, hypotension and chills following recent perianal SSTI c/b perirectal abscess with Penrose drain in place. He was profoundly hypotensive in the ED  with elevated lactate, requiring IV fluids and pressors for a short period of time. He was started on vancomycin  and Zosyn . CT of the abdomen and pelvis did not reveal any drainable collection. Urology and general surgery were both consulted and evaluated patient. Their exam was reassuring and lesion appeared to be well-healing without indication for surgical or operative intervention. Antibiotics were discontinued after 4 days without growth on blood cultures. Patient was monitored off antibiotics for >72 hours with fevers or infectious symptoms. Inflammatory markers were reassuring. ID consulted and recommended discharging off antibiotics with no further ID follow-up necessary. General surgery recommended leaving penrose drain in place at discharge with plan for outpatient follow-up in 1 week for further reassessment.       #Epidermoid inclusion cyst Incidentally noted to have small mass localized to R posterior neck. Palpable, non-fluctuant, non-tender.  US  notable for epidermoid inclusion cyst.   #Left eye blurry vision  Reported blurry vision and tenderness to left temporal artery area. inflammatory markers were negative. Evaluated by Optho with low suspicion for GCA, funduscopic exam unrevealing.   #AKI on CKD III, resolved #Hyperkalemia, resolved Creatinine initially elevated to 2.5 on admission with baseline around 1.7 iso distributive shock. C/f pre-renal etiology iso hypovolemia however given episode of HoTN higher risk for ATN and also tacrolimus  toxicity. UA unrevealing.     #HFpEF (EF 60-65%, 10/2022) #HTN Continued on home amlodipine  5 mg daily and lisinopril  20 mg daily. Did not require diuresis while inpatient.   #RLL Pulmonary Nodule  New nodular right lower lobe opacity on CT chest 09/16/2023. Given rapid appearance this is favored to be of infectious/inflammatory etiology. Recommend follow-up chest CT in 3-6 months to ensure resolution.    #History of OLT 2009 C/B atypical  rejection versus AIH 2016 #Chronic immunosuppression Stable from a transplant perspective and on longterm immunosuppression. Continued on home tacrolimus  dose 0.5 mg AM and 1 mg PM . Discussed with transplant hepatology who recommended continuing to hold MMF in the setting of recent infection. Continued on home prednisone  7.5 mg daily without concern for adrenal insufficiency. Instructed to continue home prednisone  taper from prior to admission  #History of CVA 10/2021  Continued on home aspirin  81 mg PO daily   #Insulin -dependent type 2 diabetes mellitus #Diabetic Neuropathy Patient most recently on home mealtime correctional scale insulin  without basal.  In the hospital he was managed on sliding scale insulin  with minimal insulin  requirements.  Discharged home insulin  regimen with close blood glucose monitoring in the setting of steroid weaning.  Continued on home gabapentin  300 mg TID and nortriptyline  10 mg.    #GERD  Continued on PPI   #Polyarticular tophaceous gout  No active concerns while inpatient. Weaning prednisone  as above. No longer taking allopurinol .      Surgeries and Procedures Performed:  N/A   _____________________  Discharge Exam:  BP (!) 141/93 (BP Location: Right upper arm, Patient Position: Lying)   Pulse 79   Temp 36.4 C (97.6 F) (Oral)   Resp 18   Ht 185.4 cm (6' 1)   Wt (!) 117.9 kg (260 lb)   SpO2 96%   BMI 34.30 kg/m  O2 Device: Nasal cannula (09/30/23 2320) GEN: Overall comfortable-appearing man resting in bed in NAD.   HEENT: MMM, OP clear, anicteric sclerae.  CV: RRR, normal S1 and S2, no M/R/G.  RESP: Breathing comfortably on RA, Lungs CTAB, no wheezes or crackles GI: soft, non-distended, mild non-specific tenderness to palpation GU: non-tender, no drainage, no discharge, wound borders along scrotum well c/d/i EXT: Warm and well perfused; no LE edema appreciated b/l.   NEURO: CN 2-12 grossly intact with face symmetric; no focal neurologic  deficits in strength or sensation, moving arms and legs bilaterally in bed with good strength SKIN: palpable mass along R posterior cervical chain, non-fluctuant, non-tender  Pertinent Lab Testing: Recent Labs  Lab 09/29/23 0811 09/30/23 0617 10/01/23 0712  NA 142 141 141  K 4.2 4.2 3.9  CL 108 108 108  CO2 25 24 24   BUN 17 15 14   CREATININE 1.7* 1.7* 1.4*  GLUCOSE 108 125 107  CALCIUM  9.3 9.1 9.3   Recent Labs  Lab 09/26/23 1423 09/27/23 0410  AST 27 16  ALT 27 25  ALKPHOS 43 35  TBILI 1.4 1.7*    Recent Labs  Lab 09/29/23 0811 09/30/23 0617 10/01/23  9287  WBC 5.8 6.9 8.5  HGB 12.2* 12.5* 13.1*  HCT 37.5* 38.7* 39.8  PLT 161 157 147*   Recent Labs  Lab 09/26/23 1423 09/27/23 0410  APTT 27.9 26.5*  INR 0.9 0.9     Other Pertinent Labs:  Lab Results  Component Value Date   CRP 0.18 10/02/2023    Micro:  Lab Results  Component Value Date   BLDCULT No growth at 4 days 09/26/2023   BLDCULT No growth at 4 days 09/26/2023       Pertinent Imaging:   US  soft tissue head and neck  Result Date: 10/01/2023 Procedure: US  SOFT TISSUE HEAD AND NECK Indication: incidientally noted nodule around R posterior cervical chain/back of neck, R22.1 Localized swelling, mass and lump, neck Comparison: None Technique: Gray-scale and color Doppler images of the neck were obtained. Findings: In the area of clinical concern there is a 0.8 x 0.6 x 0.9 cm anechoic cyst within the subcutaneous tissues with thin tract extending to the skin surface. No suspicious mass or lymph nodes are visualized. Imaging of the contralateral left neck for comparison demonstrates no sonographic abnormality.  Impression: Small epidermoid inclusion cyst in the area of clinical concern. Electronically Signed by:  Cordella Forts, MD, [facility] Electronically Signed on:  10/01/2023 10:07 AM  CT abdomen pelvis with contrast  Result Date: 09/27/2023 CT abdomen and pelvis with IV contrast Comparison:   CT abdomen and pelvis 09/16/2023. Indication:  Abdominal abscess/infection suspected, K61.1 Rectal abscess. Technique:  CT imaging was performed of the abdomen and pelvis following the administration of intravenous contrast.  Iodinated contrast was used due to the indications for the examination, to improve disease detection and further define anatomy. Coronal and sagittal reformatted images were generated and reviewed. Findings: - Lower Thorax: New nodular opacity measuring 1.0 cm in the right lower lobe (series 3, image 8). No pleural or pericardial effusions. Lipomatous hypertrophy of the interatrial septum. - Liver: Status post liver transplant. No suspicious hepatic masses are identified.  The portal and hepatic veins are patent. - Biliary and Gallbladder: No intrahepatic or extrahepatic bile duct dilatation. The gallbladder is surgically absent. - Spleen: Normal in appearance.  - Pancreas: Normal in appearance. - Adrenal Glands: Normal in appearance. - Kidneys: Symmetric in size and enhancement. Punctate nonobstructing left lower pole calculus. Simple bilateral renal cysts. No suspicious renal lesions. No hydronephrosis. - Abdominal and Pelvic Vasculature: No abdominal aortic aneurysm. Mild atherosclerotic plaque. - Gastrointestinal Tract: No abnormal dilation or wall thickening. Colonic diverticulosis. - Peritoneum/Mesentery/Retroperitoneum: No free fluid.  No free intraperitoneal air. - Lymph Nodes: No retroperitoneal, mesenteric, pelvic, or inguinal lymphadenopathy.  - Bladder: Diffuse bladder wall thickening. - Pelvic Organs: Unremarkable. - Body Wall: Postsurgical changes of the perineum with Penrose drain in place. Similar possible fistulous tract extending superiorly toward the anal canal (series 6, image 86). No discrete drainable fluid collection. No soft tissue gas. Small fat-containing umbilical hernia. - Musculoskeletal:  No aggressive appearing osseous lesions. Status post L4-S1 posterior fixation.  Impression: 1.  Postsurgical changes of the perineum with Penrose drain in place. Similar tract extending superiorly from the perineum to the anal canal of uncertain patency. No soft tissue gas or organized drainable collection. 2.  Diffuse bladder wall thickening which may in part be related to underdistention versus chronic bladder outlet obstruction. Correlate with urinalysis for cystitis. 3.  New nodular right lower lobe opacity from 09/16/2023. Given rapid appearance this is favored to be of infectious/inflammatory etiology. Recommend follow-up chest CT  in 3-6 months to ensure resolution. The preliminary report (critical or emergent communication) was reviewed prior to this dictation and there are no critical differences between the preliminary results and the impressions in this final report. Electronically Reviewed by:  Venetia Schanz, DO, [facility] Electronically Reviewed on:  09/27/2023 7:46 AM I have reviewed the images and concur with the above findings. Electronically Signed by:  Christen Loa, MD, [facility] Electronically Signed on:  09/27/2023 8:22 AM  X-ray chest single view portable  Result Date: 09/26/2023 XR CHEST SINGLE VIEW PORTABLE Indication: Heart Size, Lung Aeration, D72.828 Other elevated white blood cell count Comparison: Chest radiograph 02/04/23 Findings/Impression: No evidence of consolidation, pneumothorax or pleural effusion. Stable cardiomediastinal silhouette. Atherosclerotic calcification of the thoracic aorta. Electronically Signed by:  Carliss Miyamoto, MD, [facility] Electronically Signed on:  09/26/2023 8:58 PM  _____________________  Code Status: Full Code Goals of care were not addressed during this admission.   Status on Discharge:  Current activity: Walks occasionally (10/01/23 0244) Current mobility: No limitation (10/01/23 0244)  Activity Recommendation: activity as tolerated  Other Discharge Instructions: Services setup at discharge: Home Health  PT/OT Tubes/lines at discharge: None  Diet: Diet renal Wound Care Order Instructions     None       _____________________  Time spent on discharge process: 45 minutes    Devin LITTIE KLUVER, MD [facility]  10/01/2023   Hospital Contact Information:  Madie Persons [facility]) [facility]) [facility])  Pending tests:  Laboratory: [phone] Microbiology: [phone] Pathology: [phone] Radiology: [phone]  General questions: [phone] Pending tests: Laboratory: [phone] Microbiology: [phone] Pathology: [phone] Radiology: [phone]  General questions:  [phone] Pending tests:  Laboratory: [phone] Microbiology: [phone] Pathology: [phone] Radiology: [phone]  General questions:  [phone]   Attending Attestation   (FR) I confirm that I was present for the key and critical portions of the service, including a review of the patient's history and other pertinent data.  I personally examined the patient, and formulated the evaluation and/or treatment plan.  I have reviewed the note of the house staff and agree with the findings documented in the note, with any exceptions as noted below.  Devin SALKELD BERNAL, MD

## 2023-10-02 ENCOUNTER — Ambulatory Visit (INDEPENDENT_AMBULATORY_CARE_PROVIDER_SITE_OTHER)

## 2023-10-02 VITALS — BP 92/60 | Ht 73.0 in | Wt 256.0 lb

## 2023-10-02 DIAGNOSIS — R221 Localized swelling, mass and lump, neck: Secondary | ICD-10-CM | POA: Diagnosis not present

## 2023-10-02 DIAGNOSIS — Z2821 Immunization not carried out because of patient refusal: Secondary | ICD-10-CM | POA: Diagnosis not present

## 2023-10-02 DIAGNOSIS — Z0000 Encounter for general adult medical examination without abnormal findings: Secondary | ICD-10-CM

## 2023-10-02 DIAGNOSIS — K611 Rectal abscess: Secondary | ICD-10-CM | POA: Diagnosis not present

## 2023-10-02 DIAGNOSIS — I959 Hypotension, unspecified: Secondary | ICD-10-CM | POA: Diagnosis not present

## 2023-10-02 NOTE — Patient Instructions (Signed)
 Mr. Harbuck , Thank you for taking time to come for your Medicare Wellness Visit. I appreciate your ongoing commitment to your health goals. Please review the following plan we discussed and let me know if I can assist you in the future.   Referrals/Orders/Follow-Ups/Clinician Recommendations: Feel better soon and discuss health maintenance with PCP.  This is a list of the screening recommended for you and due dates:  Health Maintenance  Topic Date Due   Complete foot exam   Never done   Yearly kidney health urinalysis for diabetes  Never done   Zoster (Shingles) Vaccine (1 of 2) Never done   DTaP/Tdap/Td vaccine (2 - Td or Tdap) 05/22/2022   Eye exam for diabetics  11/01/2023*   Hemoglobin A1C  10/17/2023   Flu Shot  01/16/2024   Yearly kidney function blood test for diabetes  09/15/2024   Medicare Annual Wellness Visit  10/01/2024   Colon Cancer Screening  10/14/2030   Pneumonia Vaccine  Completed   Hepatitis C Screening  Completed   HIV Screening  Completed   HPV Vaccine  Aged Out   Meningitis B Vaccine  Aged Out   COVID-19 Vaccine  Discontinued  *Topic was postponed. The date shown is not the original due date.    Advanced directives: (Declined) Advance directive discussed with you today. Even though you declined this today, please call our office should you change your mind, and we can give you the proper paperwork for you to fill out.  Next Medicare Annual Wellness Visit scheduled for next year: No. Patient states he might be going to [facility] Next year so he declined.

## 2023-10-02 NOTE — Progress Notes (Signed)
 Because this visit was a virtual/telehealth visit,  certain criteria was not obtained, such a blood pressure, CBG if applicable, and timed get up and go. Any medications not marked as "taking" were not mentioned during the medication reconciliation part of the visit. Any vitals not documented were not able to be obtained due to this being a telehealth visit or patient was unable to self-report a recent blood pressure reading due to a lack of equipment at home via telehealth. Vitals that have been documented are verbally provided by the patient.  Subjective:   Devin White is a 68 y.o. who presents for a Medicare Wellness preventive visit.  Visit Complete: Virtual I connected with  [name] on 10/02/23 by a audio enabled telemedicine application and verified that I am speaking with the correct person using two identifiers.  Patient Location: Home  Provider Location: Home Office  I discussed the limitations of evaluation and management by telemedicine. The patient expressed understanding and agreed to proceed.  Vital Signs: Because this visit was a virtual/telehealth visit, some criteria may be missing or patient reported. Any vitals not documented were not able to be obtained and vitals that have been documented are patient reported.  VideoDeclined- This patient declined Librarian, academic. Therefore the visit was completed with audio only.  Persons Participating in Visit: Patient.  AWV Questionnaire: No: Patient Medicare AWV questionnaire was not completed prior to this visit.  Cardiac Risk Factors include: advanced age (>35men, >64 women);male gender;diabetes mellitus;hypertension;dyslipidemia;obesity (BMI >30kg/m2)     Objective:    Today's Vitals   10/02/23 0812 10/02/23 0817  BP: 92/60   Weight: 256 lb (116.1 kg)   Height: 6\' 1"  (1.854 m)   PainSc:  0-No pain   Body mass index is 33.78 kg/m.     10/02/2023    8:23 AM 09/09/2023    2:41  PM 09/06/2023    2:27 PM 04/18/2023    2:56 PM 02/27/2023   10:14 AM 11/15/2022    6:48 PM 11/05/2022    3:47 PM  Advanced Directives  Does Patient Have a Medical Advance Directive? No No No No No No No  Would patient like information on creating a medical advance directive? No - Patient declined No - Patient declined No - Patient declined  No - Patient declined No - Patient declined No - Patient declined    Current Medications (verified) Outpatient Encounter Medications as of 10/02/2023  Medication Sig   acetaminophen (TYLENOL) 325 MG tablet Take 650 mg by mouth every 6 (six) hours as needed for mild pain.   amLODipine (NORVASC) 10 MG tablet Take 1 tablet (10 mg total) by mouth daily.   amoxicillin-clavulanate (AUGMENTIN) 875-125 MG tablet Take 1 tablet by mouth 2 (two) times daily.   aspirin EC 81 MG tablet Take 1 tablet (81 mg total) by mouth daily. Swallow whole.   Blood Glucose Monitoring Suppl DEVI 1 each by Does not apply route in the morning, at noon, and at bedtime. May substitute to any manufacturer covered by patient's insurance.   calcium carbonate (TUMS) 500 MG chewable tablet Chew 1 tablet (200 mg of elemental calcium total) by mouth 2 (two) times daily as needed for indigestion or heartburn.   colchicine 0.6 MG tablet Take 1 tablet (0.6 mg total) by mouth daily as needed.   Continuous Glucose Sensor (FREESTYLE LIBRE 3 PLUS SENSOR) MISC Change sensor every 15 days.   diclofenac Sodium (VOLTAREN) 1 % GEL APPLY 4 GRAMS TOPICALLY  TO THE AFFECTED AREA FOUR TIMES DAILY   feeding supplement, GLUCERNA SHAKE, (GLUCERNA SHAKE) LIQD Take 237 mLs by mouth 2 (two) times daily between meals.   [Paused] furosemide (LASIX) 40 MG tablet TAKE 1 TABLET(40 MG) BY MOUTH DAILY   gabapentin (NEURONTIN) 300 MG capsule Take 1 capsule (300 mg total) by mouth 3 (three) times daily.   glipiZIDE (GLUCOTROL XL) 2.5 MG 24 hr tablet TAKE 1 TABLET(2.5 MG) BY MOUTH DAILY WITH BREAKFAST   insulin glargine (LANTUS)  100 UNIT/ML Solostar Pen Inject 30 Units into the skin daily.   insulin lispro (HUMALOG KWIKPEN) 100 UNIT/ML KwikPen 0-15 Units, Subcutaneous, 3 times daily before meals & bedtime  CBG 70 - 120: 0 units  CBG 121 - 150: 2 units  CBG 151 - 200: 3 units  CBG 201 - 250: 5 units  CBG 251 - 300: 8 units  CBG 301 - 350: 11 units  CBG 351 - 400: 15 units  CBG > 400: call MD   Insulin Pen Needle 32G X 4 MM MISC 1 each by Does not apply route as directed.   linezolid (ZYVOX) 600 MG tablet Take 600 mg by mouth 2 (two) times daily.   methocarbamol (ROBAXIN) 500 MG tablet Take by mouth 2 (two) times daily.   Multiple Vitamin (MULTIVITAMIN WITH MINERALS) TABS tablet Take 1 tablet by mouth daily.   mycophenolate (CELLCEPT) 250 MG capsule Take 250-500 mg by mouth 2 (two) times daily. 2 capsules (500 mg) in the morning and one capsule (250 mg) in the evening   nortriptyline (PAMELOR) 10 MG capsule Take 10 mg by mouth 2 (two) times daily.   omeprazole (PRILOSEC) 20 MG capsule TAKE 1 CAPSULE(20 MG) BY MOUTH DAILY   oxyCODONE (OXY IR/ROXICODONE) 5 MG immediate release tablet Take 1-2 tablets (5-10 mg total) by mouth every 4 (four) hours as needed for moderate pain (pain score 4-6) or severe pain (pain score 7-10).   predniSONE (DELTASONE) 10 MG tablet Take 7.5 mg by mouth daily with breakfast.   rosuvastatin (CRESTOR) 20 MG tablet Take 1 tablet (20 mg total) by mouth daily.   senna-docusate (SENOKOT-S) 8.6-50 MG tablet Take 1 tablet by mouth 2 (two) times daily.   tacrolimus (PROGRAF) 0.5 MG capsule Take 0.5-1 mg by mouth See admin instructions. Taking 0.5 mg in the morning and 1 mg at bedtime   tamsulosin (FLOMAX) 0.4 MG CAPS capsule TAKE 1 CAPSULE(0.4 MG) BY MOUTH DAILY   trimethoprim (TRIMPEX) 100 MG tablet Take 100 mg by mouth daily.   [Paused] lisinopril (ZESTRIL) 20 MG tablet Take 20 mg by mouth daily. (Patient not taking: Reported on 10/02/2023)   No facility-administered encounter medications on file  as of 10/02/2023.    Allergies (verified) Levaquin [levofloxacin]   History: Past Medical History:  Diagnosis Date   Arthritis    back and legs   Benign prostatic hypertrophy    CHF (congestive heart failure) (HCC)    GERD (gastroesophageal reflux disease)    Gout    History of blood transfusion    pt has antibodies in his blood since previous transfusions   History of cirrhosis of liver S/P TRANSPLANT 2009   History of liver failure S/P TRANSPLANT   Hypertension    Left ureteral calculus    Renal disorder    Stroke [facility])    Past Surgical History:  Procedure Laterality Date   APPENDECTOMY     bone morrow biopsy     CHOLECYSTECTOMY  2007   CYSTO/  LEFT RETROGRADE PYELOGRAM/ LEFT URETERAL STENT PLACEMENT  03-05-2012  DR Berneice Heinrich [facility])   LEFT URETERAL CALCULI   CYSTOSCOPY W/ URETERAL STENT PLACEMENT  03/11/2012   Procedure: CYSTOSCOPY WITH STENT REPLACEMENT;  Surgeon: Sebastian Ache, MD;  Location: [facility];  Service: Urology;  Laterality: Left;   HERNIA REPAIR  2008   INCISION AND DRAINAGE ABSCESS N/A 09/09/2023   Procedure: INCISION AND DRAINAGE, ABSCESS;  Surgeon: Leafy Ro, MD;  Location: [facility];  Service: General;  Laterality: N/A;  Lithotomy   LIVER BIOPSY     LIVER TRANSPLANT  11/24/2007   pt states doing well since liver transplant   LUMBAR DISC SURGERY     LUMBAR FUSION     removal of fistula     URETEROSCOPY  03/11/2012   Procedure: URETEROSCOPY;  Surgeon: Sebastian Ache, MD;  Location: [facility];  Service: Urology;  Laterality: Left;   STONE MANIPULATION, stone obtained  [phone] [employer] MCR   Family History  Problem Relation Age of Onset   Cancer Mother        colon   Arthritis Mother    Vasculitis Mother    Hypertension Mother    Cancer Father        colon   Kidney disease Father    Arthritis Father    Arthritis Brother    Alcohol abuse Maternal Aunt    Diabetes Maternal Aunt    Alcohol abuse Maternal Uncle     Diabetes Paternal Aunt    Alcohol abuse Maternal Uncle    Alcohol abuse Maternal Uncle    Social History   Socioeconomic History   Marital status: Single    Spouse name: Not on file   Number of children: 3   Years of education: Not on file   Highest education level: 9th grade  Occupational History   Occupation: disabled    Employer: RETIRED  Tobacco Use   Smoking status: Never    Passive exposure: Past   Smokeless tobacco: Never  Vaping Use   Vaping status: Never Used  Substance and Sexual Activity   Alcohol use: No   Drug use: No   Sexual activity: Not on file  Other Topics Concern   Not on file  Social History Narrative   Has living will   Requests daughter [name] as his health care POA   Would accept brief attempt at resuscitation but no prolonged ventilation   No tube feeds if cognitively unaware   Social Drivers of Health   Financial Resource Strain: Low Risk  (10/02/2023)   Overall Financial Resource Strain (CARDIA)    Difficulty of Paying Living Expenses: Not very hard  Food Insecurity: No Food Insecurity (10/02/2023)   Hunger Vital Sign    Worried About Running Out of Food in the Last Year: Never true    Ran Out of Food in the Last Year: Never true  Transportation Needs: No Transportation Needs (10/02/2023)   PRAPARE - Administrator, Civil Service (Medical): No    Lack of Transportation (Non-Medical): No  Physical Activity: Insufficiently Active (10/02/2023)   Exercise Vital Sign    Days of Exercise per Week: 2 days    Minutes of Exercise per Session: 20 min  Stress: No Stress Concern Present (10/02/2023)   [employer] of Occupational Health - Occupational Stress Questionnaire    Feeling of Stress : Not at all  Social Connections: Socially Isolated (10/02/2023)   Social Connection and Isolation Panel [NHANES]  Frequency of Communication with Friends and Family: Three times a week    Frequency of Social Gatherings with Friends and Family:  Twice a week    Attends Religious Services: Never    Database administrator or Organizations: No    Attends Engineer, structural: Never    Marital Status: Divorced    Tobacco Counseling Counseling given: Not Answered    Clinical Intake:  Pre-visit preparation completed: Yes  Pain : No/denies pain Pain Score: 0-No pain     BMI - recorded: 33.78 Nutritional Status: BMI > 30  Obese Nutritional Risks: Nausea/ vomitting/ diarrhea Diabetes: Yes CBG done?: No Did pt. bring in CBG monitor from home?: No  Lab Results  Component Value Date   HGBA1C 10.6 (H) 04/19/2023   HGBA1C 7.0 (A) 02/14/2023   HGBA1C 7.1 (H) 11/06/2022     How often do you need to have someone help you when you read instructions, pamphlets, or other written materials from your doctor or pharmacy?: 1 - Never What is the last grade level you completed in school?: 9th grade  Interpreter Needed?: No  Information entered by :: [employer]   Activities of Daily Living     10/02/2023    8:22 AM 09/06/2023    8:04 PM  In your present state of health, do you have any difficulty performing the following activities:  Hearing? 0   Vision? 0   Difficulty concentrating or making decisions? 0   Walking or climbing stairs? 0   Dressing or bathing? 0   Doing errands, shopping? 0 0  Preparing Food and eating ? N   Using the Toilet? N   In the past six months, have you accidently leaked urine? N   Do you have problems with loss of bowel control? N   Managing your Medications? N   Managing your Finances? N   Housekeeping or managing your Housekeeping? N     Patient Care Team: Helaine Llanos, MD as PCP - General Foltanski, Delaney Fearing, [facility] (Inactive) as Pharmacist (Pharmacist)  Indicate any recent Medical Services you may have received from other than [facility] providers in the past year (date may be approximate).     Assessment:   This is a routine wellness examination for  Layn.  Hearing/Vision screen Hearing Screening - Comments:: No hearing issues Vision Screening - Comments:: Some vision issues   Goals Addressed             This Visit's Progress    Patient Stated       Patient states he would like to get better       Depression Screen     10/02/2023    8:24 AM 09/23/2023   11:09 AM 02/27/2023   10:15 AM 07/16/2022    1:51 PM 05/02/2021   12:49 PM 05/02/2021   11:57 AM 05/01/2020    4:43 PM  PHQ 2/9 Scores  PHQ - 2 Score 0 0 0 0 0 0 0  PHQ- 9 Score 3          Fall Risk     10/02/2023    8:23 AM 09/23/2023   11:05 AM 02/27/2023   10:15 AM 07/16/2022    1:48 PM 05/02/2021   12:49 PM  Fall Risk   Falls in the past year? 1 0 0 0 1  Number falls in past yr: 0 0   0  Injury with Fall? 0 0   0  Risk for fall due  to : No Fall Risks      Follow up Falls evaluation completed        MEDICARE RISK AT HOME:  Medicare Risk at Home Any stairs in or around the home?: No If so, are there any without handrails?: No Home free of loose throw rugs in walkways, pet beds, electrical cords, etc?: Yes Adequate lighting in your home to reduce risk of falls?: Yes Life alert?: No Use of a cane, walker or w/c?: No Grab bars in the bathroom?: Yes Shower chair or bench in shower?: Yes Elevated toilet seat or a handicapped toilet?: Yes  TIMED UP AND GO:  Was the test performed?  No  Cognitive Function: 6CIT completed        10/02/2023    8:20 AM 10/02/2023    8:19 AM  6CIT Screen  What Year? 0 points 0 points  What month? 0 points 0 points  What time? 0 points 0 points  Count back from 20 0 points 0 points  Months in reverse 0 points 0 points  Repeat phrase 0 points 0 points  Total Score 0 points 0 points    Immunizations Immunization History  Administered Date(s) Administered   Fluad Quad(high Dose 65+) 03/26/2019, 05/01/2020, 05/02/2021, 03/15/2022   Fluad Trivalent(High Dose 65+) 05/01/2023   Influenza Split 04/15/2011   Influenza  Whole 04/17/2010   Influenza, High Dose Seasonal PF 03/30/2018   Influenza, Seasonal, Injecte, Preservative Fre 05/22/2012   Influenza,inj,Quad PF,6+ Mos 08/09/2013, 03/30/2015, 03/22/2016, 04/07/2017   PNEUMOCOCCAL CONJUGATE-20 04/10/2022   Pneumococcal Conjugate-13 12/01/2014   Pneumococcal Polysaccharide-23 06/01/2012, 10/10/2017   Tdap 05/22/2012    Screening Tests Health Maintenance  Topic Date Due   FOOT EXAM  Never done   Diabetic kidney evaluation - Urine ACR  Never done   Zoster Vaccines- Shingrix (1 of 2) Never done   DTaP/Tdap/Td (2 - Td or Tdap) 05/22/2022   OPHTHALMOLOGY EXAM  11/01/2023 (Originally 01/01/1966)   HEMOGLOBIN A1C  10/17/2023   INFLUENZA VACCINE  01/16/2024   Diabetic kidney evaluation - eGFR measurement  09/15/2024   Medicare Annual Wellness (AWV)  10/01/2024   Colonoscopy  10/14/2030   Pneumonia Vaccine 17+ Years old  Completed   Hepatitis C Screening  Completed   HIV Screening  Completed   HPV VACCINES  Aged Out   Meningococcal B Vaccine  Aged Out   COVID-19 Vaccine  Discontinued    Health Maintenance: patient states he wants to declined things in the health maintenance at this time until he get to feeling better.  Health Maintenance Due  Topic Date Due   FOOT EXAM  Never done   Diabetic kidney evaluation - Urine ACR  Never done   Zoster Vaccines- Shingrix (1 of 2) Never done   DTaP/Tdap/Td (2 - Td or Tdap) 05/22/2022   Health Maintenance Items Addressed:   Additional Screening:  Vision Screening: Recommended annual ophthalmology exams for early detection of glaucoma and other disorders of the eye.  Dental Screening: Recommended annual dental exams for proper oral hygiene  Community Resource Referral / Chronic Care Management: CRR required this visit?  No   CCM required this visit?  No     Plan:     I have personally reviewed and noted the following in the patient's chart:   Medical and social history Use of alcohol, tobacco  or illicit drugs  Current medications and supplements including opioid prescriptions. Patient is not currently taking opioid prescriptions. Functional ability and status Nutritional status Physical  activity Advanced directives List of other physicians Hospitalizations, surgeries, and ER visits in previous 12 months Vitals Screenings to include cognitive, depression, and falls Referrals and appointments  In addition, I have reviewed and discussed with patient certain preventive protocols, quality metrics, and best practice recommendations. A written personalized care plan for preventive services as well as general preventive health recommendations were provided to patient.     Freeda Jerry, [place]   10/02/2023   After Visit Summary: (MyChart) Due to this being a telephonic visit, the after visit summary with patients personalized plan was offered to patient via MyChart   Notes: Nothing significant to report at this time.

## 2023-10-03 DIAGNOSIS — R221 Localized swelling, mass and lump, neck: Secondary | ICD-10-CM | POA: Diagnosis not present

## 2023-10-03 DIAGNOSIS — K611 Rectal abscess: Secondary | ICD-10-CM | POA: Diagnosis not present

## 2023-10-03 DIAGNOSIS — I959 Hypotension, unspecified: Secondary | ICD-10-CM | POA: Diagnosis not present

## 2023-10-06 ENCOUNTER — Telehealth: Payer: Self-pay

## 2023-10-06 NOTE — Transitions of Care (Post Inpatient/ED Visit) (Signed)
   10/06/2023  Name: Devin White MRN: [id number] DOB: Mar 01, 1956  Today's TOC FU Call Status: Today's TOC FU Call Status:: Unsuccessful Call (1st Attempt) Unsuccessful Call (1st Attempt) Date: 10/06/23  Attempted to reach the patient regarding the most recent Inpatient/ED visit.  Follow Up Plan: Additional outreach attempts will be made to reach the patient to complete the Transitions of Care (Post Inpatient/ED visit) call.   Brown Cape, RN, BSN, CCM [facility], [facility] RN Care Manager Direct Dial : [phone]

## 2023-10-06 NOTE — Transitions of Care (Post Inpatient/ED Visit) (Signed)
   10/06/2023  Name: Devin White MRN: [id number] DOB: [birth year]  Today's TOC FU Call Status: Today's TOC FU Call Status:: Unsuccessful Call (2nd Attempt) Unsuccessful Call (2nd Attempt) Date: 10/06/23  Attempted to reach the patient regarding the most recent Inpatient/ED visit.  Follow Up Plan: Additional outreach attempts will be made to reach the patient to complete the Transitions of Care (Post Inpatient/ED visit) call.   Brown Cape, RN, BSN, CCM [facility], [facility] RN Care Manager Direct Dial : [phone]

## 2023-10-07 ENCOUNTER — Telehealth: Payer: Self-pay

## 2023-10-07 NOTE — Transitions of Care (Post Inpatient/ED Visit) (Signed)
   10/07/2023  Name: Devin White MRN: [id number] DOB: Dec 23, 1955  Today's TOC FU Call Status: Today's TOC FU Call Status:: Unsuccessful Call (3rd Attempt)  Attempted to reach the patient regarding the most recent Inpatient/ED visit. Spoke with patient briefly on hold for 10 minutes.  Called patient back and left a voicemail with call back number on voicemail.  Follow Up Plan: No further outreach attempts will be made at this time. We have been unable to contact the patient.  Brown Cape, RN, BSN, CCM [facility], [facility] RN Care Manager Direct Dial : [phone]

## 2023-10-08 ENCOUNTER — Ambulatory Visit (INDEPENDENT_AMBULATORY_CARE_PROVIDER_SITE_OTHER): Admitting: Internal Medicine

## 2023-10-08 ENCOUNTER — Encounter: Payer: Self-pay | Admitting: Internal Medicine

## 2023-10-08 VITALS — BP 122/86 | HR 88 | Temp 97.8°F | Ht 73.0 in | Wt 254.0 lb

## 2023-10-08 DIAGNOSIS — K610 Anal abscess: Secondary | ICD-10-CM | POA: Diagnosis not present

## 2023-10-08 DIAGNOSIS — I10 Essential (primary) hypertension: Secondary | ICD-10-CM

## 2023-10-08 DIAGNOSIS — E1142 Type 2 diabetes mellitus with diabetic polyneuropathy: Secondary | ICD-10-CM | POA: Diagnosis not present

## 2023-10-08 DIAGNOSIS — Z7984 Long term (current) use of oral hypoglycemic drugs: Secondary | ICD-10-CM

## 2023-10-08 MED ORDER — ONDANSETRON HCL 4 MG PO TABS: 4.0000 mg | ORAL_TABLET | Freq: Three times a day (TID) | ORAL | 1 refills | Status: AC | PRN

## 2023-10-08 NOTE — Progress Notes (Signed)
 Subjective:    Patient ID: Devin White, male    DOB: 09/09/1955, 68 y.o.   MRN: [id number]  HPI Here for hospital follow up Had gone back to [facility] and was readmitted from 4/11 to 4/18  He was profoundly hypotensive on initial presentation and required fluid resuscitation and brief pressors Empiric zosyn  and vanco CT did not show any fluid collection for further surgical intervention Was seen by urology and surgery--both deferred any other action Antibiotics stopped after 3 days ID consultation recommended sending home without antibiotics Penrose drain was left in place with plan for surgical follow up  No more wound leakage No longer has dressing  Still feels sick Nausea and had sweats last night  Sugars have been good Only taking the humalog  if sugars over 120 before--usually 2 units Is not taking the lantus ---due to low sugar reactions  Lisinopril  and furosemide  are on hold BP still running low--so holding off Furosemide  is weight triggered--hasn't needed it for weeks  Current Outpatient Medications on File Prior to Visit  Medication Sig Dispense Refill   acetaminophen  (TYLENOL ) 325 MG tablet Take 650 mg by mouth every 6 (six) hours as needed for mild pain.     amLODipine  (NORVASC ) 10 MG tablet Take 1 tablet (10 mg total) by mouth daily. 90 tablet 3   aspirin  EC 81 MG tablet Take 1 tablet (81 mg total) by mouth daily. Swallow whole. 30 tablet 0   Blood Glucose Monitoring Suppl DEVI 1 each by Does not apply route in the morning, at noon, and at bedtime. May substitute to any manufacturer covered by patient's insurance. 1 each 0   calcium  carbonate (TUMS) 500 MG chewable tablet Chew 1 tablet (200 mg of elemental calcium  total) by mouth 2 (two) times daily as needed for indigestion or heartburn. 180 tablet 1   colchicine  0.6 MG tablet Take 1 tablet (0.6 mg total) by mouth daily as needed. 30 tablet 1   Continuous Glucose Sensor (FREESTYLE LIBRE 3 PLUS SENSOR) MISC Change  sensor every 15 days. 2 each 12   diclofenac  Sodium (VOLTAREN ) 1 % GEL APPLY 4 GRAMS TOPICALLY TO THE AFFECTED AREA FOUR TIMES DAILY 100 g 5   feeding supplement, GLUCERNA SHAKE, (GLUCERNA SHAKE) LIQD Take 237 mLs by mouth 2 (two) times daily between meals. 237 mL 0   [Paused] furosemide  (LASIX ) 40 MG tablet TAKE 1 TABLET(40 MG) BY MOUTH DAILY 90 tablet 3   gabapentin  (NEURONTIN ) 300 MG capsule Take 1 capsule (300 mg total) by mouth 3 (three) times daily. 270 capsule 3   glipiZIDE  (GLUCOTROL  XL) 2.5 MG 24 hr tablet TAKE 1 TABLET(2.5 MG) BY MOUTH DAILY WITH BREAKFAST 90 tablet 0   insulin  glargine (LANTUS ) 100 UNIT/ML Solostar Pen Inject 30 Units into the skin daily. 1 mL 0   insulin  lispro (HUMALOG  KWIKPEN) 100 UNIT/ML KwikPen 0-15 Units, Subcutaneous, 3 times daily before meals & bedtime  CBG 70 - 120: 0 units  CBG 121 - 150: 2 units  CBG 151 - 200: 3 units  CBG 201 - 250: 5 units  CBG 251 - 300: 8 units  CBG 301 - 350: 11 units  CBG 351 - 400: 15 units  CBG > 400: call MD 15 mL 11   Insulin  Pen Needle 32G X 4 MM MISC 1 each by Does not apply route as directed. 200 each 11   [Paused] lisinopril  (ZESTRIL ) 20 MG tablet Take 20 mg by mouth daily.     methocarbamol  (ROBAXIN ) 500  MG tablet Take by mouth 2 (two) times daily.     Multiple Vitamin (MULTIVITAMIN WITH MINERALS) TABS tablet Take 1 tablet by mouth daily.     mycophenolate  (CELLCEPT ) 250 MG capsule Take 250-500 mg by mouth 2 (two) times daily. 2 capsules (500 mg) in the morning and one capsule (250 mg) in the evening     nortriptyline  (PAMELOR ) 10 MG capsule Take 10 mg by mouth 2 (two) times daily.     omeprazole  (PRILOSEC) 20 MG capsule TAKE 1 CAPSULE(20 MG) BY MOUTH DAILY 30 capsule 11   oxyCODONE  (OXY IR/ROXICODONE ) 5 MG immediate release tablet Take 1-2 tablets (5-10 mg total) by mouth every 4 (four) hours as needed for moderate pain (pain score 4-6) or severe pain (pain score 7-10). 30 tablet 0   predniSONE  (DELTASONE ) 10 MG tablet  Take 7.5 mg by mouth daily with breakfast.     rosuvastatin  (CRESTOR ) 20 MG tablet Take 1 tablet (20 mg total) by mouth daily. 90 tablet 3   senna-docusate (SENOKOT-S) 8.6-50 MG tablet Take 1 tablet by mouth 2 (two) times daily. 60 tablet 0   tacrolimus  (PROGRAF ) 0.5 MG capsule Take 0.5-1 mg by mouth See admin instructions. Taking 0.5 mg in the morning and 1 mg at bedtime     tamsulosin  (FLOMAX ) 0.4 MG CAPS capsule TAKE 1 CAPSULE(0.4 MG) BY MOUTH DAILY 90 capsule 3   trimethoprim  (TRIMPEX ) 100 MG tablet Take 100 mg by mouth daily.     No current facility-administered medications on file prior to visit.    Allergies  Allergen Reactions   Levaquin  [Levofloxacin ] Shortness Of Breath    SOB and severe leg pain/weakness    Past Medical History:  Diagnosis Date   Arthritis    back and legs   Benign prostatic hypertrophy    CHF (congestive heart failure) (HCC)    GERD (gastroesophageal reflux disease)    Gout    History of blood transfusion    pt has antibodies in his blood since previous transfusions   History of cirrhosis of liver S/P TRANSPLANT 2009   History of liver failure S/P TRANSPLANT   Hypertension    Left ureteral calculus    Renal disorder    Stroke [facility])     Past Surgical History:  Procedure Laterality Date   APPENDECTOMY     bone morrow biopsy     CHOLECYSTECTOMY  2007   CYSTO/ LEFT RETROGRADE PYELOGRAM/ LEFT URETERAL STENT PLACEMENT  03-05-2012  DR Secundino [facility])   LEFT URETERAL CALCULI   CYSTOSCOPY W/ URETERAL STENT PLACEMENT  03/11/2012   Procedure: CYSTOSCOPY WITH STENT REPLACEMENT;  Surgeon: Osborn Blaze, MD;  Location: [facility];  Service: Urology;  Laterality: Left;   HERNIA REPAIR  2008   INCISION AND DRAINAGE ABSCESS N/A 09/09/2023   Procedure: INCISION AND DRAINAGE, ABSCESS;  Surgeon: Alben Alma, MD;  Location: [facility];  Service: General;  Laterality: N/A;  Lithotomy   LIVER BIOPSY     LIVER TRANSPLANT  11/24/2007   pt states doing  well since liver transplant   LUMBAR DISC SURGERY     LUMBAR FUSION     removal of fistula     URETEROSCOPY  03/11/2012   Procedure: URETEROSCOPY;  Surgeon: Osborn Blaze, MD;  Location: [facility];  Service: Urology;  Laterality: Left;   STONE MANIPULATION, stone obtained  [phone] [employer] MCR    Family History  Problem Relation Age of Onset   Cancer Mother  colon   Arthritis Mother    Vasculitis Mother    Hypertension Mother    Cancer Father        colon   Kidney disease Father    Arthritis Father    Arthritis Brother    Alcohol abuse Maternal Aunt    Diabetes Maternal Aunt    Alcohol abuse Maternal Uncle    Diabetes Paternal Aunt    Alcohol abuse Maternal Uncle    Alcohol abuse Maternal Uncle     Social History   Socioeconomic History   Marital status: Single    Spouse name: Not on file   Number of children: 3   Years of education: Not on file   Highest education level: 9th grade  Occupational History   Occupation: disabled    Employer: RETIRED  Tobacco Use   Smoking status: Never    Passive exposure: Past   Smokeless tobacco: Never  Vaping Use   Vaping status: Never Used  Substance and Sexual Activity   Alcohol use: No   Drug use: No   Sexual activity: Not on file  Other Topics Concern   Not on file  Social History Narrative   Has living will   Requests daughter Lovett Ruck as his health care POA   Would accept brief attempt at resuscitation but no prolonged ventilation   No tube feeds if cognitively unaware   Social Drivers of Health   Financial Resource Strain: Low Risk  (10/07/2023)   Received from [facility]   Overall Financial Resource Strain (CARDIA)    Difficulty of Paying Living Expenses: Not hard at all  Food Insecurity: No Food Insecurity (10/07/2023)   Received from [facility]   Hunger Vital Sign    Worried About Running Out of Food in the Last Year: Never true    Ran Out of Food in  the Last Year: Never true  Transportation Needs: No Transportation Needs (10/07/2023)   Received from [facility] - Transportation    In the past 12 months, has lack of transportation kept you from medical appointments or from getting medications?: No    Lack of Transportation (Non-Medical): No  Physical Activity: Insufficiently Active (10/02/2023)   Exercise Vital Sign    Days of Exercise per Week: 2 days    Minutes of Exercise per Session: 20 min  Stress: No Stress Concern Present (10/02/2023)   [employer] of Occupational Health - Occupational Stress Questionnaire    Feeling of Stress : Not at all  Social Connections: Socially Isolated (10/02/2023)   Social Connection and Isolation Panel [NHANES]    Frequency of Communication with Friends and Family: Three times a week    Frequency of Social Gatherings with Friends and Family: Twice a week    Attends Religious Services: Never    Database administrator or Organizations: No    Attends Banker Meetings: Never    Marital Status: Divorced  Catering manager Violence: Not At Risk (10/02/2023)   Humiliation, Afraid, Rape, and Kick questionnaire    Fear of Current or Ex-Partner: No    Emotionally Abused: No    Physically Abused: No    Sexually Abused: No   Review of Systems     Objective:   Physical Exam Constitutional:      Appearance: Normal appearance.     Comments: Looks okay now  Neurological:     Mental Status: He is alert.  Psychiatric:  Mood and Affect: Mood normal.        Behavior: Behavior normal.            Assessment & Plan:

## 2023-10-08 NOTE — Assessment & Plan Note (Signed)
 Sugars running low Off the lantus  Using novolog  just as needed (low dose) Will follow up with [facility]

## 2023-10-08 NOTE — Assessment & Plan Note (Signed)
 And Fournier's infection Still with penrose drain in place --and is getting surgical reevaluation in 2 days (and likely having it removed) No dressings Done with antibiotics Some sweats and nausea---but doesn't appear ill now To ER if worsens

## 2023-10-08 NOTE — Assessment & Plan Note (Signed)
 BP Readings from Last 3 Encounters:  10/08/23 122/86  10/02/23 92/60  09/26/23 92/60   Will stay off the lisinopril  for now

## 2023-10-10 DIAGNOSIS — K611 Rectal abscess: Secondary | ICD-10-CM | POA: Diagnosis not present

## 2023-10-14 ENCOUNTER — Ambulatory Visit: Admit: 2023-10-14 | Admitting: Ophthalmology

## 2023-10-14 SURGERY — PHACOEMULSIFICATION, CATARACT, WITH IOL INSERTION
Anesthesia: Topical | Laterality: Right

## 2023-10-16 DIAGNOSIS — Z944 Liver transplant status: Secondary | ICD-10-CM | POA: Diagnosis not present

## 2023-10-16 DIAGNOSIS — D849 Immunodeficiency, unspecified: Secondary | ICD-10-CM | POA: Diagnosis not present

## 2023-11-03 ENCOUNTER — Encounter: Payer: 59 | Admitting: Internal Medicine

## 2023-11-03 DIAGNOSIS — D849 Immunodeficiency, unspecified: Secondary | ICD-10-CM | POA: Diagnosis not present

## 2023-11-03 DIAGNOSIS — R911 Solitary pulmonary nodule: Secondary | ICD-10-CM | POA: Diagnosis not present

## 2023-11-03 DIAGNOSIS — Z944 Liver transplant status: Secondary | ICD-10-CM | POA: Diagnosis not present

## 2023-11-03 DIAGNOSIS — D72828 Other elevated white blood cell count: Secondary | ICD-10-CM | POA: Diagnosis not present

## 2023-11-03 DIAGNOSIS — E119 Type 2 diabetes mellitus without complications: Secondary | ICD-10-CM | POA: Diagnosis not present

## 2023-11-03 DIAGNOSIS — Z794 Long term (current) use of insulin: Secondary | ICD-10-CM | POA: Diagnosis not present

## 2023-11-03 DIAGNOSIS — R935 Abnormal findings on diagnostic imaging of other abdominal regions, including retroperitoneum: Secondary | ICD-10-CM | POA: Diagnosis not present

## 2023-11-05 DIAGNOSIS — N1831 Chronic kidney disease, stage 3a: Secondary | ICD-10-CM | POA: Diagnosis not present

## 2023-11-05 DIAGNOSIS — I10 Essential (primary) hypertension: Secondary | ICD-10-CM | POA: Diagnosis not present

## 2023-11-05 DIAGNOSIS — Z944 Liver transplant status: Secondary | ICD-10-CM | POA: Diagnosis not present

## 2023-11-05 DIAGNOSIS — E1122 Type 2 diabetes mellitus with diabetic chronic kidney disease: Secondary | ICD-10-CM | POA: Diagnosis not present

## 2023-11-05 DIAGNOSIS — D72829 Elevated white blood cell count, unspecified: Secondary | ICD-10-CM | POA: Diagnosis not present

## 2023-11-05 DIAGNOSIS — E119 Type 2 diabetes mellitus without complications: Secondary | ICD-10-CM | POA: Diagnosis not present

## 2023-11-05 DIAGNOSIS — E1165 Type 2 diabetes mellitus with hyperglycemia: Secondary | ICD-10-CM | POA: Diagnosis not present

## 2023-11-05 DIAGNOSIS — Z794 Long term (current) use of insulin: Secondary | ICD-10-CM | POA: Diagnosis not present

## 2023-11-05 DIAGNOSIS — E1169 Type 2 diabetes mellitus with other specified complication: Secondary | ICD-10-CM | POA: Diagnosis not present

## 2023-11-07 ENCOUNTER — Other Ambulatory Visit: Payer: Self-pay | Admitting: Internal Medicine

## 2023-11-11 DIAGNOSIS — H8113 Benign paroxysmal vertigo, bilateral: Secondary | ICD-10-CM | POA: Diagnosis not present

## 2023-11-11 DIAGNOSIS — R911 Solitary pulmonary nodule: Secondary | ICD-10-CM | POA: Diagnosis not present

## 2023-11-11 DIAGNOSIS — Z944 Liver transplant status: Secondary | ICD-10-CM | POA: Diagnosis not present

## 2023-11-11 DIAGNOSIS — Z539 Procedure and treatment not carried out, unspecified reason: Secondary | ICD-10-CM | POA: Diagnosis not present

## 2023-11-11 DIAGNOSIS — D849 Immunodeficiency, unspecified: Secondary | ICD-10-CM | POA: Diagnosis not present

## 2023-11-11 DIAGNOSIS — D84821 Immunodeficiency due to drugs: Secondary | ICD-10-CM | POA: Diagnosis not present

## 2023-11-20 DIAGNOSIS — Z944 Liver transplant status: Secondary | ICD-10-CM | POA: Diagnosis not present

## 2023-11-20 DIAGNOSIS — M5412 Radiculopathy, cervical region: Secondary | ICD-10-CM | POA: Diagnosis not present

## 2023-11-20 DIAGNOSIS — D849 Immunodeficiency, unspecified: Secondary | ICD-10-CM | POA: Diagnosis not present

## 2023-12-04 ENCOUNTER — Other Ambulatory Visit: Payer: Self-pay | Admitting: Internal Medicine

## 2023-12-05 DIAGNOSIS — G8929 Other chronic pain: Secondary | ICD-10-CM | POA: Diagnosis not present

## 2023-12-05 DIAGNOSIS — M5412 Radiculopathy, cervical region: Secondary | ICD-10-CM | POA: Diagnosis not present

## 2023-12-05 DIAGNOSIS — M9931 Osseous stenosis of neural canal of cervical region: Secondary | ICD-10-CM | POA: Diagnosis not present

## 2023-12-05 DIAGNOSIS — M5442 Lumbago with sciatica, left side: Secondary | ICD-10-CM | POA: Diagnosis not present

## 2023-12-05 DIAGNOSIS — M542 Cervicalgia: Secondary | ICD-10-CM | POA: Diagnosis not present

## 2023-12-05 DIAGNOSIS — M5416 Radiculopathy, lumbar region: Secondary | ICD-10-CM | POA: Diagnosis not present

## 2023-12-05 DIAGNOSIS — M5441 Lumbago with sciatica, right side: Secondary | ICD-10-CM | POA: Diagnosis not present

## 2023-12-08 ENCOUNTER — Other Ambulatory Visit: Payer: Self-pay | Admitting: Internal Medicine

## 2023-12-20 DIAGNOSIS — I5032 Chronic diastolic (congestive) heart failure: Secondary | ICD-10-CM | POA: Diagnosis not present

## 2023-12-20 DIAGNOSIS — R600 Localized edema: Secondary | ICD-10-CM | POA: Diagnosis not present

## 2023-12-20 DIAGNOSIS — R931 Abnormal findings on diagnostic imaging of heart and coronary circulation: Secondary | ICD-10-CM | POA: Diagnosis not present

## 2023-12-20 DIAGNOSIS — E1122 Type 2 diabetes mellitus with diabetic chronic kidney disease: Secondary | ICD-10-CM | POA: Diagnosis not present

## 2023-12-20 DIAGNOSIS — R0789 Other chest pain: Secondary | ICD-10-CM | POA: Diagnosis not present

## 2023-12-20 DIAGNOSIS — R55 Syncope and collapse: Secondary | ICD-10-CM | POA: Diagnosis not present

## 2023-12-20 DIAGNOSIS — R29818 Other symptoms and signs involving the nervous system: Secondary | ICD-10-CM | POA: Diagnosis not present

## 2023-12-20 DIAGNOSIS — I130 Hypertensive heart and chronic kidney disease with heart failure and stage 1 through stage 4 chronic kidney disease, or unspecified chronic kidney disease: Secondary | ICD-10-CM | POA: Diagnosis not present

## 2023-12-20 DIAGNOSIS — R202 Paresthesia of skin: Secondary | ICD-10-CM | POA: Diagnosis not present

## 2023-12-20 DIAGNOSIS — R200 Anesthesia of skin: Secondary | ICD-10-CM | POA: Diagnosis not present

## 2023-12-20 DIAGNOSIS — I959 Hypotension, unspecified: Secondary | ICD-10-CM | POA: Diagnosis not present

## 2023-12-20 DIAGNOSIS — R001 Bradycardia, unspecified: Secondary | ICD-10-CM | POA: Diagnosis not present

## 2023-12-20 DIAGNOSIS — G43809 Other migraine, not intractable, without status migrainosus: Secondary | ICD-10-CM | POA: Diagnosis not present

## 2023-12-20 DIAGNOSIS — Z794 Long term (current) use of insulin: Secondary | ICD-10-CM | POA: Diagnosis not present

## 2023-12-20 DIAGNOSIS — N183 Chronic kidney disease, stage 3 unspecified: Secondary | ICD-10-CM | POA: Diagnosis not present

## 2023-12-21 DIAGNOSIS — R200 Anesthesia of skin: Secondary | ICD-10-CM | POA: Diagnosis not present

## 2023-12-21 DIAGNOSIS — R55 Syncope and collapse: Secondary | ICD-10-CM | POA: Diagnosis not present

## 2023-12-21 DIAGNOSIS — I959 Hypotension, unspecified: Secondary | ICD-10-CM | POA: Diagnosis not present

## 2023-12-21 DIAGNOSIS — R001 Bradycardia, unspecified: Secondary | ICD-10-CM | POA: Diagnosis not present

## 2023-12-23 ENCOUNTER — Telehealth: Payer: Self-pay

## 2023-12-23 NOTE — Transitions of Care (Post Inpatient/ED Visit) (Signed)
   12/23/2023  Name: Devin White MRN: [id number] DOB: 07/11/1955  Today's TOC FU Call Status: Today's TOC FU Call Status:: Successful TOC FU Call Completed TOC FU Call Complete Date: 12/23/23 Patient's Name and Date of Birth confirmed.  Transition Care Management Follow-up Telephone Call Date of Discharge: 12/22/23 Discharge Facility: Other ([facility]) Name of Other ([facility]) Discharge Facility: [facility] Type of Discharge: Inpatient Admission Primary Inpatient Discharge Diagnosis:: syncope How have you been since you were released from the hospital?: Better Any questions or concerns?: No  Items Reviewed: Did you receive and understand the discharge instructions provided?: Yes Medications obtained,verified, and reconciled?: Yes (Medications Reviewed) Any new allergies since your discharge?: No Dietary orders reviewed?: Yes  Medications Reviewed Today: Medications Reviewed Today   Medications were not reviewed in this encounter     Home Care and Equipment/Supplies: Were Home Health Services Ordered?: NA Any new equipment or medical supplies ordered?: NA  Functional Questionnaire: Do you need assistance with bathing/showering or dressing?: No Do you need assistance with meal preparation?: No Do you need assistance with eating?: No Do you have difficulty maintaining continence: No Do you need assistance with getting out of bed/getting out of a chair/moving?: No Do you have difficulty managing or taking your medications?: No  Follow up appointments reviewed: PCP Follow-up appointment confirmed?: Yes Date of PCP follow-up appointment?: 12/25/23 Follow-up Provider: [facility] Follow-up appointment confirmed?: Yes Date of Specialist follow-up appointment?: 01/22/24 Follow-Up Specialty Provider:: cardio Do you need transportation to your follow-up appointment?: No Do you understand care options if your condition(s) worsen?: Yes-patient verbalized  understanding    SIGNATURE Julian Lemmings, LPN [facility] Nurse Health Advisor Direct Dial  [phone]

## 2023-12-25 ENCOUNTER — Encounter: Payer: Self-pay | Admitting: Internal Medicine

## 2023-12-25 ENCOUNTER — Ambulatory Visit: Admitting: Internal Medicine

## 2023-12-25 VITALS — BP 104/66 | HR 94 | Temp 97.2°F | Ht 73.0 in | Wt 260.0 lb

## 2023-12-25 DIAGNOSIS — E2740 Unspecified adrenocortical insufficiency: Secondary | ICD-10-CM

## 2023-12-25 MED ORDER — FLUDROCORTISONE ACETATE 0.1 MG PO TABS: 0.1000 mg | ORAL_TABLET | Freq: Every day | ORAL | 3 refills | Status: DC

## 2023-12-25 NOTE — Addendum Note (Signed)
 Addended by: JIMMY ADE I on: 12/25/2023 11:58 AM   Modules accepted: Level of Service
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
## 2023-12-25 NOTE — Assessment & Plan Note (Signed)
 His persistent dizziness, etc are likely due to adrenal insufficiency as he weaned off prednisone  Will try fludrocortisone  0.1mg  daily Hope to wean this after a while if it helps

## 2023-12-29 DIAGNOSIS — I10 Essential (primary) hypertension: Secondary | ICD-10-CM | POA: Diagnosis not present

## 2023-12-29 DIAGNOSIS — R0602 Shortness of breath: Secondary | ICD-10-CM | POA: Diagnosis not present

## 2023-12-29 DIAGNOSIS — R42 Dizziness and giddiness: Secondary | ICD-10-CM | POA: Diagnosis not present

## 2023-12-29 DIAGNOSIS — R55 Syncope and collapse: Secondary | ICD-10-CM | POA: Diagnosis not present

## 2023-12-30 ENCOUNTER — Encounter: Payer: Self-pay | Admitting: Internal Medicine

## 2023-12-30 DIAGNOSIS — R55 Syncope and collapse: Secondary | ICD-10-CM | POA: Diagnosis not present

## 2023-12-31 DIAGNOSIS — N179 Acute kidney failure, unspecified: Secondary | ICD-10-CM | POA: Diagnosis not present

## 2023-12-31 DIAGNOSIS — I5022 Chronic systolic (congestive) heart failure: Secondary | ICD-10-CM | POA: Diagnosis not present

## 2023-12-31 DIAGNOSIS — E7800 Pure hypercholesterolemia, unspecified: Secondary | ICD-10-CM | POA: Diagnosis not present

## 2023-12-31 DIAGNOSIS — Z944 Liver transplant status: Secondary | ICD-10-CM | POA: Diagnosis not present

## 2023-12-31 DIAGNOSIS — E2749 Other adrenocortical insufficiency: Secondary | ICD-10-CM | POA: Diagnosis not present

## 2023-12-31 DIAGNOSIS — Y92008 Other place in unspecified non-institutional (private) residence as the place of occurrence of the external cause: Secondary | ICD-10-CM | POA: Diagnosis not present

## 2023-12-31 DIAGNOSIS — E869 Volume depletion, unspecified: Secondary | ICD-10-CM | POA: Diagnosis not present

## 2023-12-31 DIAGNOSIS — R079 Chest pain, unspecified: Secondary | ICD-10-CM | POA: Diagnosis not present

## 2023-12-31 DIAGNOSIS — Z8673 Personal history of transient ischemic attack (TIA), and cerebral infarction without residual deficits: Secondary | ICD-10-CM | POA: Diagnosis not present

## 2023-12-31 DIAGNOSIS — M7989 Other specified soft tissue disorders: Secondary | ICD-10-CM | POA: Diagnosis not present

## 2023-12-31 DIAGNOSIS — R931 Abnormal findings on diagnostic imaging of heart and coronary circulation: Secondary | ICD-10-CM | POA: Diagnosis not present

## 2023-12-31 DIAGNOSIS — R911 Solitary pulmonary nodule: Secondary | ICD-10-CM | POA: Diagnosis not present

## 2023-12-31 DIAGNOSIS — Z79624 Long term (current) use of inhibitors of nucleotide synthesis: Secondary | ICD-10-CM | POA: Diagnosis not present

## 2023-12-31 DIAGNOSIS — K219 Gastro-esophageal reflux disease without esophagitis: Secondary | ICD-10-CM | POA: Diagnosis not present

## 2023-12-31 DIAGNOSIS — Z881 Allergy status to other antibiotic agents status: Secondary | ICD-10-CM | POA: Diagnosis not present

## 2023-12-31 DIAGNOSIS — Z79621 Long term (current) use of calcineurin inhibitor: Secondary | ICD-10-CM | POA: Diagnosis not present

## 2023-12-31 DIAGNOSIS — I517 Cardiomegaly: Secondary | ICD-10-CM | POA: Diagnosis not present

## 2023-12-31 DIAGNOSIS — J811 Chronic pulmonary edema: Secondary | ICD-10-CM | POA: Diagnosis not present

## 2023-12-31 DIAGNOSIS — R509 Fever, unspecified: Secondary | ICD-10-CM | POA: Diagnosis not present

## 2023-12-31 DIAGNOSIS — I951 Orthostatic hypotension: Secondary | ICD-10-CM | POA: Diagnosis not present

## 2023-12-31 DIAGNOSIS — K529 Noninfective gastroenteritis and colitis, unspecified: Secondary | ICD-10-CM | POA: Diagnosis not present

## 2023-12-31 DIAGNOSIS — R55 Syncope and collapse: Secondary | ICD-10-CM | POA: Diagnosis not present

## 2023-12-31 DIAGNOSIS — R0602 Shortness of breath: Secondary | ICD-10-CM | POA: Diagnosis not present

## 2023-12-31 DIAGNOSIS — Z79899 Other long term (current) drug therapy: Secondary | ICD-10-CM | POA: Diagnosis not present

## 2023-12-31 DIAGNOSIS — Z5181 Encounter for therapeutic drug level monitoring: Secondary | ICD-10-CM | POA: Diagnosis not present

## 2023-12-31 DIAGNOSIS — D849 Immunodeficiency, unspecified: Secondary | ICD-10-CM | POA: Diagnosis not present

## 2023-12-31 DIAGNOSIS — I5032 Chronic diastolic (congestive) heart failure: Secondary | ICD-10-CM | POA: Diagnosis not present

## 2023-12-31 DIAGNOSIS — M1A9XX1 Chronic gout, unspecified, with tophus (tophi): Secondary | ICD-10-CM | POA: Diagnosis not present

## 2023-12-31 DIAGNOSIS — I509 Heart failure, unspecified: Secondary | ICD-10-CM | POA: Diagnosis not present

## 2023-12-31 DIAGNOSIS — E1122 Type 2 diabetes mellitus with diabetic chronic kidney disease: Secondary | ICD-10-CM | POA: Diagnosis not present

## 2023-12-31 DIAGNOSIS — E538 Deficiency of other specified B group vitamins: Secondary | ICD-10-CM | POA: Diagnosis not present

## 2023-12-31 DIAGNOSIS — S0990XA Unspecified injury of head, initial encounter: Secondary | ICD-10-CM | POA: Diagnosis not present

## 2023-12-31 DIAGNOSIS — D84821 Immunodeficiency due to drugs: Secondary | ICD-10-CM | POA: Diagnosis not present

## 2023-12-31 DIAGNOSIS — Z1152 Encounter for screening for COVID-19: Secondary | ICD-10-CM | POA: Diagnosis not present

## 2023-12-31 DIAGNOSIS — I130 Hypertensive heart and chronic kidney disease with heart failure and stage 1 through stage 4 chronic kidney disease, or unspecified chronic kidney disease: Secondary | ICD-10-CM | POA: Diagnosis not present

## 2023-12-31 DIAGNOSIS — N183 Chronic kidney disease, stage 3 unspecified: Secondary | ICD-10-CM | POA: Diagnosis not present

## 2023-12-31 DIAGNOSIS — E1140 Type 2 diabetes mellitus with diabetic neuropathy, unspecified: Secondary | ICD-10-CM | POA: Diagnosis not present

## 2023-12-31 DIAGNOSIS — W138XXA Fall from, out of or through other building or structure, initial encounter: Secondary | ICD-10-CM | POA: Diagnosis not present

## 2023-12-31 DIAGNOSIS — R52 Pain, unspecified: Secondary | ICD-10-CM | POA: Diagnosis not present

## 2023-12-31 DIAGNOSIS — Z7982 Long term (current) use of aspirin: Secondary | ICD-10-CM | POA: Diagnosis not present

## 2023-12-31 DIAGNOSIS — I110 Hypertensive heart disease with heart failure: Secondary | ICD-10-CM | POA: Diagnosis not present

## 2023-12-31 DIAGNOSIS — R42 Dizziness and giddiness: Secondary | ICD-10-CM | POA: Diagnosis not present

## 2024-01-01 DIAGNOSIS — D849 Immunodeficiency, unspecified: Secondary | ICD-10-CM | POA: Diagnosis not present

## 2024-01-01 DIAGNOSIS — R0602 Shortness of breath: Secondary | ICD-10-CM | POA: Diagnosis not present

## 2024-01-01 DIAGNOSIS — R52 Pain, unspecified: Secondary | ICD-10-CM | POA: Diagnosis not present

## 2024-01-05 NOTE — Discharge Summary (Addendum)
 [facility] Medicine Discharge Summary  Admit Date: 12/31/2023 Discharge Date: 01/07/2024 11:00AM  Admitting Physician: Morgan Aimee [place], MD Discharge Physician: LAMAR JAYSON FEIL, MD   Primary Care Provider: Jimmy Charlie FERNS, MD, Phone [phone]  Discharge Destination: Home with home health: PT and OT  Admission Diagnoses:  Syncope and collapse [R55] Shortness of breath [R06.02] Chronic congestive heart failure, unspecified heart failure type (CMS/HHS-HCC) [I50.9]  Discharge Diagnoses:  Principal Problem:   Syncope Active Problems:   Subjective fever   Bilateral leg edema   Arthritis due to gout Resolved Problems:   * No resolved hospital problems. *  Primary Diagnosis: Admitted for syncope  Changes Made (with rationale):  Discontinued amlodipine  and started verapamil 240mg  once daily to support preload  Started B12 500mcg once daily for B12 deficiency Started Flonase and Mucinex for cough and congestive symptoms (Still not using fludrocortisone  or insulin , which he had stopped taking prior to admission)  To-Do List (incidental findings, follow-up studies, etc.): F/u with PCP on 07/30 F/u with outpatient cardiology on 08/07  Anticipatory Guidance for Outpatient Care:      Results Pending at Discharge:  None Please see phone numbers at end of this summary for lab contact information.   Follow-up/Care Transition Plan: Sched. appts: Future Appointments  Date Time Provider Department Center  01/13/2024  2:00 PM Debora Purchase, MD DS RHEUMATOL [facility]  01/16/2024  7:20 AM PFT LAB B-[facility] CLINIC [id number] [facility]  01/16/2024  8:20 AM Bobbi Donnice Hamilton, MD AAACTR [facility]  01/22/2024 10:20 AM Will Rima, MD [facility]  02/10/2024  1:00 PM Manuelita Harlene Houston, NP DSUROLOGY [facility]  02/17/2024 11:00 AM Lars Rea Base, NP [facility]  02/23/2024  8:20 AM Nola Tobias Brunt, MD LIVERINTTP [facility]  05/17/2024  11:00 AM Brendia Calton Squires, PA [facility] MARYL JAYSON    Follow-up info: PCP: Jimmy Charlie FERNS, MD [address] [place] [address] [phone]  Go to scheduled 7/30  [facility] [place], [place] [address] [place]  [id number] [phone]        Allergies/Intolerances:  Allergies  Allergen Reactions  . Levofloxacin  Shortness Of Breath    SOB and severe leg pain/weakness     New Adverse Drug Events: none  Medications:     Current Discharge Medication List     PAUSE taking these medications      Instructions  amLODIPine  5 MG tablet Wait to take this until your doctor or other care provider tells you to start again. Quantity: 30 tablet Refills: 0  Commonly known as: NORVASC  Take 1 tablet (5 mg total) by mouth once daily   fludrocortisone  0.1 mg tablet Wait to take this until your doctor or other care provider tells you to start again. Refills: 0  Commonly known as: FLORINEF  Take 0.1 mg by mouth once daily   insulin  LISPRO pen injector (concentration 100 units/mL) Wait to take this until your doctor or other care provider tells you to start again. Quantity: 15 mL Refills: 0 What changed:  when to take this reasons to take this  Commonly known as: HumaLOG  KWIKPEN Please take your lantus  as directed by your PCP per the chart you are following at home   LANTUS  SOLOSTAR U-100 INSULIN  pen injector (concentration 100 units/mL) Wait to take this until your doctor or other care provider tells you to start again. Quantity: 30 mL Refills: 0 Generic drug: insulin  GLARGINE What changed:  when to take  this reasons to take this  Please take your lantus  as directed by your PCP per the chart you are following at home       START taking these medications      Instructions  cyanocobalamin 500 MCG tablet Quantity: 30 tablet Refills: 0 Start taking on: January 08, 2024  Commonly known as: VITAMIN B12 Take 1  tablet (500 mcg total) by mouth once daily Last time this was given: 500 mcg on January 07, 2024  8:05 AM   fluticasone propionate 50 mcg/actuation nasal spray Quantity: 16 g Refills: 0  Commonly known as: FLONASE Place 1 spray into both nostrils every 12 (twelve) hours Last time this was given: 1 spray on January 07, 2024  8:09 AM   guaiFENesin 600 mg SR tablet Quantity: 20 tablet Refills: 0 Stop taking on: January 17, 2024  Commonly known as: MUCINEX Take 1 tablet (600 mg total) by mouth 2 (two) times daily as needed for Cough for up to 10 days Last time this was given: 600 mg on January 07, 2024  8:06 AM   lidocaine  4 % patch Quantity: 28 patch Refills: 0 Stop taking on: January 21, 2024  Commonly known as: SALONPAS Place 2 patches onto the skin daily for 14 days Apply patch to the most painful area for up to 12 hours in a 24 hours period. Last time this was given: 2 patches on January 06, 2024  3:34 PM   verapamiL 120 MG SR tablet Quantity: 60 tablet Refills: 0  Commonly known as: CALAN-SR Take 2 tablets (240 mg total) by mouth once daily       These medications have been CHANGED      Instructions  gabapentin  100 MG capsule Quantity: 30 capsule Refills: 0 What changed:  medication strength how much to take when to take this  Commonly known as: NEURONTIN  Take 1 capsule (100 mg total) by mouth at bedtime Last time this was given: 100 mg on January 06, 2024  7:53 PM   potassium chloride  10 mEq ER tablet Quantity: 30 tablet Refills: 0 What changed:  when to take this reasons to take this additional instructions  Commonly known as: KLOR-CON  M10 Take 1 tablet (10 mEq total) by mouth once daily as needed Take potassium ONLY IF you take the Lasix        CONTINUE taking these medications      Instructions  acetaminophen  500 MG tablet Refills: 0  Commonly known as: TYLENOL  Take 500 mg by mouth once daily as needed for Pain Last time this was given: 650 mg on January 07, 2024   8:23 AM   aspirin  81 mg Cap Refills: 0  Take 81 mg by mouth once daily Last time this was given: Ask your nurse or doctor   calcium  carbonate 500 mg calcium  (1,250 mg) chewable tablet Refills: 0  Take 1 tablet (1,250 mg of salt total) by mouth once daily as needed   colchicine  0.6 mg tablet Refills: 0  Commonly known as: COLCRYS  Take 0.6 mg by mouth once daily as needed (gout) Last took ~ 2 w ago and then only 2 doses as it tears up his stomach Last time this was given: 1.2 mg on January 02, 2024  3:04 PM   FLOMAX  0.4 mg capsule Refills: 11 Generic drug: tamsulosin   Take 1 capsule by mouth every morning Last time this was given: 0.4 mg on January 07, 2024  8:05 AM   FREESTYLE LIBRE 3 PLUS SENSOR [place]  Refills: 0  Inject 1 each subcutaneously once daily   FUROsemide  40 MG tablet Refills: 0  Commonly known as: LASIX  Take 40 mg by mouth once daily as needed for Edema   GVOKE HYPOPEN 2-PACK 1 mg/0.2 mL auto-injector Quantity: 0.4 mL Refills: 1 Generic drug: glucagon  Inject 0.2 mLs (1 mg total) subcutaneously as directed for Low blood sugar   magnesium  oxide 400 mg (241.3 mg magnesium ) tablet Quantity: 30 tablet Refills: 0  Commonly known as: MAG-OX Take 1 tablet (400 mg total) by mouth once daily for 30 days Last time this was given: 400 mg on January 07, 2024  8:06 AM   multivitamin with minerals tablet Refills: 0  Take 1 tablet by mouth once daily   omeprazole  20 MG DR capsule Quantity: 60 capsule Refills: 0  Commonly known as: PriLOSEC Take 1 capsule (20 mg total) by mouth once daily.   ondansetron  4 MG tablet Refills: 0  Commonly known as: ZOFRAN  Take 4 mg by mouth every 8 (eight) hours as needed for Nausea Last time this was given: 4 mg on January 04, 2024  8:44 PM   rosuvastatin  20 MG tablet Refills: 0  Commonly known as: CRESTOR  Take 20 mg by mouth once daily Last time this was given: 20 mg on January 07, 2024  8:06 AM   sennosides 8.6 mg tablet Refills: 0   Commonly known as: SENOKOT Take 2 tablets by mouth once daily as needed   tacrolimus  0.5 MG capsule Quantity: 270 capsule Refills: 3  Commonly known as: PROGRAF  TAKE 1 CAPSULE BY MOUTH EVERY MORNING, AND 2 CAPSULES AT BEDTIME Doctor's comments: Transplant: 11/22/2007 (Liver) Discharge: 11/30/2007 ICD10: Liver replaced by transplant - Z94.4 Loc: [facility] [place], [place]) Last time this was given: 0.5 mg on January 07, 2024  8:07 AM         Brief History of Present Illness: per H&P 7/16:   NICKOLAS CHALFIN is a 68 y.o. male with extensive PMH, pertinent for MASLD cirrhosis status s/p OLT 2009 (CMV D+/R-, EBV D+/R-) C/B atypical rejection versus [place]ovo AIH, CKD stage III, HFpEF, insulin -dependent type 2 DM (now no longer requiring insulin ) GERD, BPH, HTN, prior CVA, polyarticular tophaceous gout, chronic diarrhea, and prior SMV thrombus s/p 6 months of DOAC (ended 05/2021), recent Fournier's gangrene c/b perianal abscess s/p washout x2 (March 2025) who presents to [facility] ED on 12/31/23 with several concerns including, episodes of LOC after experiencing dizzy spells, increased swelling in BLE over the last couple of weeks, dry cough over the last 2 weeks with 2 day history of chills/fever (tmax 100.5), chest tightness/SOB at times, and concern for about 7 lb weight gain over 3 days the same week of presentation. Patient reported dizziness that started after discharge from the hospital in May 2025 (for Fournier's gangrene). In the past few weeks he began to have episodes of suddenly passing out without any prodrome. Pt was started on fludrocortisone  07/11 by PCP as there was concern that his syncopal episodes were related to low BP from AI (weaned off chronic prednisone  2 weeks ago). He noted a 7 lb weight gain over 3 days and no improvement in LOC episodes so was then taken off Fludrocortisone  by Cardiologist who he saw on 7/14; was prescribed short course of Lasix  at this time.  He has had a dry  cough for the past few days with chills/fever 1 day prior to admission resolved with Tylenol . No sick contacts or recent travel. Endorses some  chest tightness and SOB during this time as well. Has not required supplemental O2 at home. No other recent med changes.    In the ED, patient was given IV Magnesium  2 g and Tylenol  x1. CXR with mild pulm edema. CT Head with no acute findings. Labs: WBC 10.6, Hgb 13.1, Cr 2.3, Mg 1.4, proBNP 150, lactate 1.2. Admitted to Hospital Medicine for further management.  _____________________   Hospital Course by Problem:   #Syncope, unspecified type Months-long dizziness spells seem consistent with vertigo although no nystagmus on exam. His more recent syncopal episodes may be related to vasovagal (especially episodes relating to bending forward, associated with N/V/diaphoresis). More recently, these episodes appear without any sort of prodrome and thus concern for cardiac etiology remained. His PCP prescribed Fludrocortisone  due to concern for adrenal insufficiency but this was discontinued by Cardiology 3 days later and placed on 4d of Lasix  given 7lb weight gain with no improvement of symptoms. Cardiology placed a 7 day Holter monitor to evaluate for arrhythmias (which he has not yet returned for analysis). 07/06 ECHO showed severe LVH with dynamic LV gradient after valsalva only (2.81m/sec, 29.75mmHg) which was not previously seen. Cardiology consult thus recommended cardiac MRI, which on 7/22 did not show any ischemia or infiltrative abnormalities. Small LV with mod asymmetric hypertrophy which likely makes him preload dependent; therefore, syncope ultimately felt most likely due to relative volume depletion, so the decision was made to stop Amlodipine  and switch to Verapamil 240mg  daily. Patient will follow up with outpatient cardiology for Holter monitor results and further management of verapamil.   Of note, as below, Endocrinology was consulted due to concern for  adrenal insufficiency given history of chronic prednisone  use from which he had completed a 51-month taper off just ~2 weeks prior to admission. After evaluation, they do not think AI is causing his syncopal episodes as his orthostatic vitals are normal and has no other concerning electrolyte or lab abnormalities.   #Borderline secondary adrenal insufficiency #hx chronic steroid use Concern for adrenal insufficiency given recent history of syncope and longstanding history of steroid use for 12 years, slowly tapered off (over 3 months) just a few weeks ago. In the past few days, he recently had diarrhea, nausea/loss of appetite, joint pains, and mild anemia but does not have recent weight loss, electrolyte disturbances, or hypotension. 07/19 cosyntropin test showed baseline 8.8 then rose to 18.2 after ACTH stimulation. ACTH was low normal, DHEAS was low at 32. Endo consulted. Per their eval, adrenal glands are working based on stim test, but there may be borderline secondary adrenal insufficiency from chronic prednisone  use and pituitary suppression. He has had some orthostatic symptoms but not orthostatic by VS. As his electrolytes are baseline, VSS, and syncope likely not a result of AI, they did not think replacement steroids were warranted at this time. Patient will followup with PCP and if other symptoms for AI develop, they can refer to endo.  #Diarrhea, abdominal pain, improving #subjective fever Patient reported having a dry cough for the past few weeks and recently chills and temp of 100.5 1 day prior to admission. VSS, no fever while hospitalized, WBC 10.6 on admission but 9.9 prior to discharge. He began to develop diarrhea, mild abdominal pain, and nausea day 2 of admission, no recent abx use or hospitalization. Per pt, he had one episode of C. Diff during a prior hospitalization. However, his current symptoms were not severe enough to warrant testing. Crackles could be heard in LLL. eRVP  negative.  CXR only showed mild pulmonary edema. While here, he gradually weaned off of O2, now on RA.  Had a R elbow effusion that self resolved.  No obvious source of concern for infection. Patient clinically improved prior to discharge.   #Hx of gout #chronic feet pain R > L, hx diabetic neuropathy Patient reported bilateral foot pain on arrival, which is likely multifactorial. His uric acid was 10.9, but on exam, his foot is not erythematous, exquisitely tender, or shows evidence of tophi. Gout is less likely causing his foot pain. He was given 1.2mg  colchicine  but did not seem effective. Patient noted improvement of foot pain as BLE edema improved.    #HFpEF   #HTN, controlled  Estimated EF > 70%, severe LVH. Patient had noticed a 7 lb weight gain in the past week with some shortness of breath. Patient had 1+ pitting edema in BLE up to the ankles and crackles were heard on exam. Edema resolved prior to discharge. Home amlodipine  was held on admission given recent syncopal episodes, and was ultimately switched to verapamil. He was largely normotensive during his stay.   #History of OLT 2009 C/B atypical rejection versus AIH 2016 #Chronic immunosuppression Home dose of tacrolimus  0.5/1 mg continued. Transplant hepatology will follow up with him as outpatient.   #AKI on CKD Stage 3, resolved Cr elevated to 2.3 on admission, baseline 1.5-1.7. Possibly due to recent Lasix  use.  CXR showed mild pulm edema. Cr back to baseline upon discharge.    #RLL pulmonary nodule - pulm nodule noted in April 2025 CT; plan was for 3-6 mo FU repeat CT   #Type 2 DM #Diabetic neuropathy  #Vitamin B12 deficiency Patient was no longer taking insulin  at home due to lower BS. A1c 6.4. Patient was supplemented with B12 due to having a level of 142, he was prescribed 500mg  once daily on discharge. Gabapentin  was reduced to 100mg  once daily.   #History of CVA 10/2021  Continue ASA.   #GERD Continue protonix .      Surgeries and Procedures Performed:  none  _____________________  Discharge Exam:  BP (!) 149/90 (BP Location: Right upper arm, Patient Position: Lying)   Pulse 76   Temp 36.6 C (97.9 F) (Oral)   Resp 18   Ht 185.4 cm (6' 1)   Wt (!) 113.7 kg (250 lb 10.6 oz)   SpO2 94%   BMI 33.07 kg/m  O2 Device: Nasal cannula (01/06/24 0732) BP (!) 149/90 (BP Location: Right upper arm, Patient Position: Lying)   Pulse 76   Temp 36.6 C (97.9 F) (Oral)   Resp 18   Ht 185.4 cm (6' 1)   Wt (!) 113.7 kg (250 lb 10.6 oz)   SpO2 94%   BMI 33.07 kg/m  General appearance: alert, appears stated age, and cooperative Lungs: clear to auscultation bilaterally and normal effort of breathing, crackles heard in LLL Heart: regular rate and rhythm, S1, S2 normal, no murmur, click, rub or gallop Abdomen: soft, non-tender; bowel sounds normal; no masses,  no organomegaly Extremities: no edema in LE, no other rashes or lesions noted Neurologic: Alert and oriented X 3, normal strength and tone Pertinent Lab Testing: Recent Labs  Lab 01/04/24 0949 01/05/24 0827 01/06/24 0811  NA 139 139 138  K 3.8 3.8 3.8  CL 103 105 104  CO2 24 25 22   BUN 13 14 17   CREATININE 1.8* 1.7* 1.7*  GLUCOSE 131 118 106  CALCIUM  9.3 9.2 9.5   Recent Labs  Lab 01/01/24 0740 01/01/24 1343  AST 17 21  ALT 22 23  ALKPHOS 58 58  TBILI 1.4 1.6*    Recent Labs  Lab 01/04/24 0948 01/05/24 0827 01/06/24 0811  WBC 7.7 7.9 9.9*  HGB 12.9* 13.0* 13.4*  HCT 39.1 39.1 40.3  PLT 198 191 200   No results for input(s): APTT, INR in the last 168 hours.    Other Pertinent Labs:  FK506 (Tacrolimus ): 01/06/24 6.3 DHEAS: 01/04/24 32 Cortisol: 01/03/24 baseline 8.8, 90 min 18.2 ACTH: 01/04/24: 18  Micro:  Lab Results  Component Value Date   BLDCULT No growth detected. 01/01/2024   BLDCULT No growth detected. 01/01/2024   01/01/24 EBV DNA by PCR: not detected    12/31/23 eRVP: negative  Pertinent Imaging:    7/22 Cardiac MRI heart stress morphology and function with and without contrast SUMMARY  ========================================================================================================== Indication: Syncope/presyncope, LVH Metal artifact in upper abdomen limits applicable techniques (GRE) 24436: Cardiac MRI morphology and function w/o contrast followed by contrast for stress imaging 1. The left ventricle is small in cavity size with moderate asymmetric hypertrophy with max. wall thickness of 1.5 cm at the basal septum. Global systolic function is hyperdynamic. The LV ejection fraction is 75%. There are no regional wall motion abnormalities. No SAM or LVOT obstruction seen. 2. The right ventricle is normal in cavity size, wall thickness, and systolic function. 3. Both atria are normal in size. 4. The aortic valve is trileaflet in morphology. There is no significant valvular disease. 5. Delayed enhancement imaging demonstrates no evidence of myocardial infarction, scar or infiltrative disease. 6. Adenosine stress perfusion imaging demonstrates no evidence of inducible myocardial ischemia.  7. There is no LV thrombus.   7/17 US  lower extremity venous bilateral Impression: 1. No deep vein thrombosis identified in the bilateral common femoral, femoral, and popliteal veins. 2. No deep vein thrombosis identified in the visualized portions of the bilateral posterior tibial and peroneal veins.  7/16 CT brain without contrast IMPRESSION: No acute intracranial traumatic injury.  7/16 X-ray chest PA and lateral FINDINGS/IMPRESSION: Cardiac rhythm monitor overlies the sternum. Stably enlarged cardiac and mediastinal contours. No focal consolidation. Mild pulmonary edema. No pleural effusion or pneumothorax.  7/6 CARD holter monitoring from 3 to 7 days - interpretation *The predominant rhythm was Sinus. *The Maximum Heart Rate recorded was 136 bpm, 07/08 19:08:36, the Minimum Heart Rate recorded  was 34 bpm, 07/08 01:14:52, and the Average Heart Rate was 77 bpm. *There were 65 VE beats with a burden of <1 %. *There were  1,239 SVE beats with a burden of <1 %. There were 3 occurrences of Supraventricular Tachycardia with the Fastest episode 136 bpm, 07/08 19:08:35, and the Longest episode 3 beats, 07/06 20:05:44. *Other rhythms in this study include: Ventricular Run, Second Degree AV Block Type I. *There were 0 Patient Triggers. 1) Sinus mechanism rhythms noted throughout with SVT and 1st degree AV block. 2) Ventricular ectopy consisted of PVCs, couplets and ventricular run. 3) No patient triggered events.   _____________________  Code Status: Prior Goals of care were not addressed during this admission.   Status on Discharge:  Current activity: Walks occasionally (01/07/24 0800) Current mobility: No limitation (01/07/24 0800)  Activity Recommendation: activity as tolerated  Other Discharge Instructions: Services setup at discharge: Home Health PT/OT Tubes/lines at discharge: None  Diet: No diet orders on file  Wound Care Order Instructions     None       _____________________  Time spent on discharge  process: 45 minutes    LAMAR JAYSON FEIL, MD [facility]  01/07/2024 [facility], Med Student [facility]  01/07/2024  Hospital Contact Information:  [facility]) [facility]) [facility])  Pending tests:  Laboratory: [phone] Microbiology: [phone] Pathology: [phone] Radiology: [phone]  General questions: [phone] Pending tests: Laboratory: [phone] Microbiology: [phone] Pathology: [phone] Radiology: [phone]  General questions:  [phone] Pending tests:  Laboratory: [phone] Microbiology: [phone] Pathology: [phone] Radiology: [phone]  General questions:  [phone]

## 2024-01-06 ENCOUNTER — Other Ambulatory Visit: Payer: Self-pay

## 2024-01-08 ENCOUNTER — Telehealth: Payer: Self-pay

## 2024-01-08 NOTE — Transitions of Care (Post Inpatient/ED Visit) (Signed)
   01/08/2024  Name: Devin White MRN: [id number] DOB: 04/05/1956  Today's TOC FU Call Status: Today's TOC FU Call Status:: Unsuccessful Call (1st Attempt) Unsuccessful Call (1st Attempt) Date: 01/08/24  Attempted to reach the patient regarding the most recent Inpatient/ED visit.  Follow Up Plan: Additional outreach attempts will be made to reach the patient to complete the Transitions of Care (Post Inpatient/ED visit) call.   Arvin Seip RN, BSN, CCM [employer], Population Health Case Manager Phone: [phone]

## 2024-01-09 ENCOUNTER — Telehealth: Payer: Self-pay

## 2024-01-09 NOTE — Telephone Encounter (Signed)
 Called pt to see how he was doing after recent hospital stay. He is scheduled here 7-30. I advised him to let us  know if he needed anything prior to that.

## 2024-01-09 NOTE — Transitions of Care (Post Inpatient/ED Visit) (Signed)
   01/09/2024  Name: Devin White MRN: [id number] DOB: 1955-10-10  Today's TOC FU Call Status: Today's TOC FU Call Status:: Successful TOC FU Call Completed TOC FU Call Complete Date: 01/09/24 (Spoke with patient who states he is switching to a [facility] PCP because his PCP is retiring and wants to be connected to [facility], and declined TOC call program also stating home health was there just today) Patient's Name and Date of Birth confirmed.  Shona Prow RN, CCM [place]  VBCI-Population Health RN Care Manager [phone]

## 2024-01-13 ENCOUNTER — Emergency Department

## 2024-01-13 ENCOUNTER — Other Ambulatory Visit: Payer: Self-pay

## 2024-01-13 ENCOUNTER — Inpatient Hospital Stay
Admission: EM | Admit: 2024-01-13 | Discharge: 2024-01-15 | DRG: 372 | Disposition: A | Discharge status: Home / Self Care | Attending: Internal Medicine | Admitting: Internal Medicine

## 2024-01-13 DIAGNOSIS — N1831 Chronic kidney disease, stage 3a: Secondary | ICD-10-CM | POA: Diagnosis not present

## 2024-01-13 DIAGNOSIS — K7460 Unspecified cirrhosis of liver: Secondary | ICD-10-CM | POA: Diagnosis not present

## 2024-01-13 DIAGNOSIS — R338 Other retention of urine: Secondary | ICD-10-CM | POA: Insufficient documentation

## 2024-01-13 DIAGNOSIS — K8689 Other specified diseases of pancreas: Secondary | ICD-10-CM | POA: Diagnosis not present

## 2024-01-13 DIAGNOSIS — N179 Acute kidney failure, unspecified: Secondary | ICD-10-CM | POA: Diagnosis not present

## 2024-01-13 DIAGNOSIS — Z8673 Personal history of transient ischemic attack (TIA), and cerebral infarction without residual deficits: Secondary | ICD-10-CM | POA: Diagnosis not present

## 2024-01-13 DIAGNOSIS — Z888 Allergy status to other drugs, medicaments and biological substances status: Secondary | ICD-10-CM | POA: Diagnosis not present

## 2024-01-13 DIAGNOSIS — E1122 Type 2 diabetes mellitus with diabetic chronic kidney disease: Secondary | ICD-10-CM | POA: Diagnosis not present

## 2024-01-13 DIAGNOSIS — E2740 Unspecified adrenocortical insufficiency: Secondary | ICD-10-CM | POA: Diagnosis not present

## 2024-01-13 DIAGNOSIS — Z841 Family history of disorders of kidney and ureter: Secondary | ICD-10-CM

## 2024-01-13 DIAGNOSIS — Z79899 Other long term (current) drug therapy: Secondary | ICD-10-CM | POA: Diagnosis not present

## 2024-01-13 DIAGNOSIS — R339 Retention of urine, unspecified: Secondary | ICD-10-CM | POA: Diagnosis not present

## 2024-01-13 DIAGNOSIS — N189 Chronic kidney disease, unspecified: Secondary | ICD-10-CM | POA: Diagnosis not present

## 2024-01-13 DIAGNOSIS — I5030 Unspecified diastolic (congestive) heart failure: Secondary | ICD-10-CM

## 2024-01-13 DIAGNOSIS — I130 Hypertensive heart and chronic kidney disease with heart failure and stage 1 through stage 4 chronic kidney disease, or unspecified chronic kidney disease: Secondary | ICD-10-CM | POA: Diagnosis present

## 2024-01-13 DIAGNOSIS — K5730 Diverticulosis of large intestine without perforation or abscess without bleeding: Secondary | ICD-10-CM | POA: Diagnosis not present

## 2024-01-13 DIAGNOSIS — Z7952 Long term (current) use of systemic steroids: Secondary | ICD-10-CM

## 2024-01-13 DIAGNOSIS — Z794 Long term (current) use of insulin: Secondary | ICD-10-CM | POA: Diagnosis not present

## 2024-01-13 DIAGNOSIS — Z981 Arthrodesis status: Secondary | ICD-10-CM

## 2024-01-13 DIAGNOSIS — K219 Gastro-esophageal reflux disease without esophagitis: Secondary | ICD-10-CM | POA: Diagnosis not present

## 2024-01-13 DIAGNOSIS — Z8249 Family history of ischemic heart disease and other diseases of the circulatory system: Secondary | ICD-10-CM

## 2024-01-13 DIAGNOSIS — E1169 Type 2 diabetes mellitus with other specified complication: Secondary | ICD-10-CM

## 2024-01-13 DIAGNOSIS — A040 Enteropathogenic Escherichia coli infection: Secondary | ICD-10-CM | POA: Diagnosis not present

## 2024-01-13 DIAGNOSIS — K5792 Diverticulitis of intestine, part unspecified, without perforation or abscess without bleeding: Secondary | ICD-10-CM | POA: Diagnosis not present

## 2024-01-13 DIAGNOSIS — D849 Immunodeficiency, unspecified: Secondary | ICD-10-CM | POA: Diagnosis not present

## 2024-01-13 DIAGNOSIS — E1129 Type 2 diabetes mellitus with other diabetic kidney complication: Secondary | ICD-10-CM | POA: Diagnosis present

## 2024-01-13 DIAGNOSIS — Z833 Family history of diabetes mellitus: Secondary | ICD-10-CM

## 2024-01-13 DIAGNOSIS — Z944 Liver transplant status: Secondary | ICD-10-CM

## 2024-01-13 DIAGNOSIS — E860 Dehydration: Secondary | ICD-10-CM | POA: Diagnosis not present

## 2024-01-13 DIAGNOSIS — M109 Gout, unspecified: Secondary | ICD-10-CM | POA: Diagnosis not present

## 2024-01-13 DIAGNOSIS — I5032 Chronic diastolic (congestive) heart failure: Secondary | ICD-10-CM | POA: Diagnosis not present

## 2024-01-13 DIAGNOSIS — Z8261 Family history of arthritis: Secondary | ICD-10-CM

## 2024-01-13 DIAGNOSIS — I639 Cerebral infarction, unspecified: Secondary | ICD-10-CM | POA: Diagnosis present

## 2024-01-13 DIAGNOSIS — I10 Essential (primary) hypertension: Secondary | ICD-10-CM | POA: Diagnosis present

## 2024-01-13 DIAGNOSIS — N401 Enlarged prostate with lower urinary tract symptoms: Secondary | ICD-10-CM | POA: Diagnosis present

## 2024-01-13 DIAGNOSIS — R197 Diarrhea, unspecified: Secondary | ICD-10-CM | POA: Diagnosis not present

## 2024-01-13 DIAGNOSIS — Z7982 Long term (current) use of aspirin: Secondary | ICD-10-CM

## 2024-01-13 LAB — BASIC METABOLIC PANEL WITH GFR
Anion gap: 12 (ref 5–15)
BUN: 32 mg/dL — ABNORMAL HIGH (ref 8–23)
CO2: 21 mmol/L — ABNORMAL LOW (ref 22–32)
Calcium: 9.7 mg/dL (ref 8.9–10.3)
Chloride: 105 mmol/L (ref 98–111)
Creatinine, Ser: 2.69 mg/dL — ABNORMAL HIGH (ref 0.61–1.24)
GFR, Estimated: 25 mL/min — ABNORMAL LOW (ref >60)
Glucose, Bld: 135 mg/dL — ABNORMAL HIGH (ref 70–99)
Potassium: 4.7 mmol/L (ref 3.5–5.1)
Sodium: 138 mmol/L (ref 135–145)

## 2024-01-13 LAB — GASTROINTESTINAL PANEL BY PCR, STOOL (REPLACES STOOL CULTURE)

## 2024-01-13 LAB — URINALYSIS, ROUTINE W REFLEX MICROSCOPIC
Bilirubin Urine: NEGATIVE
Glucose, UA: NEGATIVE mg/dL
Hgb urine dipstick: NEGATIVE
Ketones, ur: NEGATIVE mg/dL
Leukocytes,Ua: NEGATIVE
Nitrite: NEGATIVE
Protein, ur: NEGATIVE mg/dL
Specific Gravity, Urine: 1.01 (ref 1.005–1.030)
pH: 5 (ref 5.0–8.0)

## 2024-01-13 LAB — CBC
HCT: 40.5 % (ref 39.0–52.0)
Hemoglobin: 13.4 g/dL (ref 13.0–17.0)
MCH: 30 pg (ref 26.0–34.0)
MCHC: 33.1 g/dL (ref 30.0–36.0)
MCV: 90.8 fL (ref 80.0–100.0)
Platelets: 243 K/uL (ref 150–400)
RBC: 4.46 MIL/uL (ref 4.22–5.81)
RDW: 14.2 % (ref 11.5–15.5)
WBC: 14.7 K/uL — ABNORMAL HIGH (ref 4.0–10.5)
nRBC: 0 % (ref 0.0–0.2)

## 2024-01-13 LAB — C DIFFICILE QUICK SCREEN W PCR REFLEX
C Diff antigen: NEGATIVE
C Diff interpretation: NOT DETECTED
C Diff toxin: NEGATIVE

## 2024-01-13 LAB — HEPATIC FUNCTION PANEL
ALT: 21 U/L (ref 0–44)
AST: 20 U/L (ref 15–41)
Albumin: 3.5 g/dL (ref 3.5–5.0)
Alkaline Phosphatase: 73 U/L (ref 38–126)
Bilirubin, Direct: 0.2 mg/dL (ref 0.0–0.2)
Indirect Bilirubin: 0.8 mg/dL (ref 0.3–0.9)
Total Bilirubin: 1 mg/dL (ref 0.0–1.2)
Total Protein: 6.6 g/dL (ref 6.5–8.1)

## 2024-01-13 MED ORDER — ENSURE PLUS HIGH PROTEIN PO LIQD
237.0000 mL | Freq: Two times a day (BID) | ORAL | Status: DC
  Administered 2024-01-14 (×2): 237 mL via ORAL

## 2024-01-13 MED ORDER — SODIUM CHLORIDE 0.9 % IV SOLN: INTRAVENOUS | Status: DC

## 2024-01-13 MED ORDER — LACTATED RINGERS IV BOLUS
1000.0000 mL | Freq: Once | INTRAVENOUS | Status: AC
  Administered 2024-01-13: 1000 mL via INTRAVENOUS

## 2024-01-13 MED ORDER — METRONIDAZOLE 500 MG/100ML IV SOLN
500.0000 mg | Freq: Two times a day (BID) | INTRAVENOUS | Status: DC
  Administered 2024-01-13 – 2024-01-15 (×4): 500 mg via INTRAVENOUS
  Filled 2024-01-13 (×5): qty 100

## 2024-01-13 MED ORDER — ONDANSETRON HCL 4 MG/2ML IJ SOLN
4.0000 mg | Freq: Four times a day (QID) | INTRAMUSCULAR | Status: DC | PRN
  Administered 2024-01-15: 4 mg via INTRAVENOUS
  Filled 2024-01-13: qty 2

## 2024-01-13 MED ORDER — ENOXAPARIN SODIUM 60 MG/0.6ML IJ SOSY
60.0000 mg | PREFILLED_SYRINGE | INTRAMUSCULAR | Status: DC
  Administered 2024-01-13 – 2024-01-14 (×2): 60 mg via SUBCUTANEOUS
  Filled 2024-01-13 (×2): qty 0.6

## 2024-01-13 MED ORDER — SODIUM CHLORIDE 0.9 % IV SOLN
2.0000 g | INTRAVENOUS | Status: DC
  Administered 2024-01-13 – 2024-01-14 (×2): 2 g via INTRAVENOUS
  Filled 2024-01-13 (×3): qty 20

## 2024-01-13 MED ORDER — ENOXAPARIN SODIUM 40 MG/0.4ML IJ SOSY: 40.0000 mg | PREFILLED_SYRINGE | INTRAMUSCULAR | Status: DC

## 2024-01-13 MED ORDER — ONDANSETRON HCL 4 MG PO TABS: 4.0000 mg | ORAL_TABLET | Freq: Four times a day (QID) | ORAL | Status: DC | PRN

## 2024-01-13 MED ORDER — ACETAMINOPHEN 325 MG PO TABS
650.0000 mg | ORAL_TABLET | Freq: Four times a day (QID) | ORAL | Status: DC | PRN
  Administered 2024-01-13 – 2024-01-15 (×4): 650 mg via ORAL
  Filled 2024-01-13 (×4): qty 2

## 2024-01-13 NOTE — Assessment & Plan Note (Signed)
 Noted acute urinary retention with 1500 cc of urine removal status post In-N-Out cath CT imaging grossly stable Continue home Flomax  Monitor

## 2024-01-13 NOTE — Assessment & Plan Note (Signed)
 Blood sugar 130s SSI Monitor

## 2024-01-13 NOTE — Assessment & Plan Note (Signed)
 2D echo July 2025 in the [facility] system with EF of 65 to 70% and grade 1 diastolic dysfunction Appears fairly euvolemic at present Gentle IV fluid hydration in setting of diarrhea Strict ins and outs and daily weights Monitor

## 2024-01-13 NOTE — Assessment & Plan Note (Signed)
 Creatinine 2.7 today with baseline creatinine 1.9-2.2 Will gently hydrate in the setting of chronic HFpEF Hold nephrotoxic agents Trend renal function Monitor

## 2024-01-13 NOTE — ED Triage Notes (Signed)
 First Nurse Note: Patient to ED via [facility] from home for urinary problems. Having rectal pain with diarrhea. Unable to urinate since 3pm yesterday. Hx Stage 4 kidney failure.  135/79 89 HR 95% RA 136 cbg

## 2024-01-13 NOTE — ED Provider Notes (Signed)
 [facility] Provider Note    Event Date/Time   First MD Initiated Contact with Patient 01/13/24 [phone]     (approximate)   History   Urinary Retention   HPI  Devin White is a 68 y.o. male who presents to the ED for evaluation of Urinary Retention   I reviewed [facility] DC summary from 7/23.  He was admitted for 1 week in the setting of shortness of breath, CHF and syncopal episodes.  2009 liver transplant on tacrolimus , CKD 3, diastolic CHF, DM off of insulin  CVA, gout, Fournier's gangrene requiring washout x 17 August 2023.   Patient presents to the ED for evaluation of difficulty voiding since about 2 or 3 PM last night.  After this, overnight he had multiple episodes of small-volume watery stool in the setting of trying to void.  Reports some lower abdominal cramping and discomfort.  Some nausea but no emesis  1 dizzy headed spell while turning over in bed last night but no further syncopal episodes since his recent admission.  He needs to send off the Holter monitor but has not mailed it in yet.  Due to urinary retention on presentation to the ED, In-N-Out cath was performed with 1500 cc of urine  He reports more prolonged timeframe of lower extremity swelling, at least 3 weeks of this, reports they did not know why he was so swollen when he was at [facility].  This persists.  Physical Exam   Triage Vital Signs: ED Triage Vitals  Encounter Vitals Group     BP 01/13/24 0729 120/85     Girls Systolic BP Percentile --      Girls Diastolic BP Percentile --      Boys Systolic BP Percentile --      Boys Diastolic BP Percentile --      Pulse Rate 01/13/24 0729 93     Resp 01/13/24 0729 20     Temp 01/13/24 0729 97.7 F (36.5 C)     Temp Source 01/13/24 0729 Oral     SpO2 01/13/24 0729 96 %     Weight 01/13/24 0730 250 lb (113.4 kg)     Height 01/13/24 0730 6' 1 (1.854 m)     Head Circumference --      Peak Flow --      Pain Score 01/13/24  0740 10     Pain Loc --      Pain Education --      Exclude from Growth Chart --     Most recent vital signs: Vitals:   01/13/24 1414 01/13/24 1415  BP: (!) 153/86   Pulse: 80   Resp: 16   Temp:  98.8 F (37.1 C)  SpO2: 94%     General: Awake, no distress.  CV:  Good peripheral perfusion.  Resp:  Normal effort.  Abd:  No distention.  Poorly localizing and fairly diffuse lower abdominal mild tenderness without guarding or peritoneal features.  Benign upper abdomen. MSK:  No deformity noted.  Neuro:  No focal deficits appreciated. Other:     ED Results / Procedures / Treatments   Labs (all labs ordered are listed, but only abnormal results are displayed) Labs Reviewed  URINALYSIS, ROUTINE W REFLEX MICROSCOPIC - Abnormal; Notable for the following components:      Result Value   Color, Urine YELLOW (*)    APPearance CLEAR (*)    All other components within normal limits  BASIC METABOLIC PANEL WITH GFR -  Abnormal; Notable for the following components:   CO2 21 (*)    Glucose, Bld 135 (*)    BUN 32 (*)    Creatinine, Ser 2.69 (*)    GFR, Estimated 25 (*)    All other components within normal limits  CBC - Abnormal; Notable for the following components:   WBC 14.7 (*)    All other components within normal limits  C DIFFICILE QUICK SCREEN W PCR REFLEX    GASTROINTESTINAL PANEL BY PCR, STOOL (REPLACES STOOL CULTURE)  HEPATIC FUNCTION PANEL    EKG   RADIOLOGY CT abdomen/pelvis interpreted by me without clear signs of acute pathology  Official radiology report(s): CT ABDOMEN PELVIS WO CONTRAST Result Date: 01/13/2024 CLINICAL DATA:  acute urinary retention , lower abd pain, diarrhea. required I/O cath for large volume urine. liver transplant patient EXAM: CT ABDOMEN AND PELVIS WITHOUT CONTRAST TECHNIQUE: Multidetector CT imaging of the abdomen and pelvis was performed following the standard protocol without IV contrast. RADIATION DOSE REDUCTION: This exam was  performed according to the departmental dose-optimization program which includes automated exposure control, adjustment of the mA and/or kV according to patient size and/or use of iterative reconstruction technique. COMPARISON:  September 09, 2023 FINDINGS: Of note, the lack of intravenous contrast limits evaluation of the solid organ parenchyma and vascularity. Lower chest: No focal airspace consolidation or pleural effusion. Mild cardiomegaly. Posterior bibasilar dependent atelectasis. Right coronary atherosclerosis. Hepatobiliary: No mass.Cholecystectomy no intrahepatic or extrahepatic biliary ductal dilation. Pancreas: Diffuse fatty atrophy of the pancreatic parenchyma. No mass or ductal dilation. No peripancreatic inflammation or fluid collection. Spleen: Normal size. No mass. Adrenals/Urinary Tract: No adrenal masses. Renal cortical atrophy bilaterally. Multiple small renal cysts. Punctate nonobstructive bilateral nephrolithiasis. No hydronephrosis. The urinary bladder is distended without focal abnormality. Stomach/Bowel: The stomach is decompressed without focal abnormality. No small bowel wall thickening or inflammation. No small bowel obstruction.Appendectomy. Descending and sigmoid colonic diverticulosis. No changes of acute diverticulitis. Vascular/Lymphatic: No aortic aneurysm. Diffuse aortoiliac atherosclerosis. No intraabdominal or pelvic lymphadenopathy. Reproductive: No prostatomegaly. No free pelvic fluid. Other: No pneumoperitoneum, ascites, or mesenteric inflammation. Musculoskeletal: No acute fracture or destructive lesion. L4 through S1 lumbosacral fusion hardware. Multilevel degenerative disc disease of the spine. Bilateral hip osteoarthritis. IMPRESSION: 1. No acute intra-abdominal or pelvic abnormality. 2. Punctate nonobstructive bilateral nephrolithiasis. No hydronephrosis. 3. Descending and sigmoid colonic diverticulosis. No changes of acute diverticulitis. Aortic Atherosclerosis  (ICD10-I70.0). Electronically Signed   By: Rogelia Myers M.D.   On: 01/13/2024 11:08    PROCEDURES and INTERVENTIONS:  Procedures  Medications  lactated ringers  bolus 1,000 mL (1,000 mLs Intravenous New Bag/Given 01/13/24 1419)     IMPRESSION / MDM / ASSESSMENT AND PLAN / ED COURSE  I reviewed the triage vital signs and the nursing notes.  Differential diagnosis includes, but is not limited to, C. difficile, gastroenteritis, colitis, dehydration or AKI, UTI,  {Patient presents with symptoms of an acute illness or injury that is potentially life-threatening.  Patient presents with urinary retention requiring a single In-N-Out catheterization and developing progressively worsening watery diarrhea, weakness requiring medical admission.  Slight worsening renal dysfunction, AKI on CKD.  Leukocytosis is noted but no fevers.  Urine without infectious features, normal LFTs.  Negative C. difficile and pending GI panel.  CT has to be noncontrast due to his GFR by protocol and does not show acute pathology.  Consult medicine for admission  Clinical Course as of 01/13/24 1431  Tue Jan 13, 2024  1229 Patient continues to have copious  watery stool.  Updated nurse on order for stool sample and C. difficile.  After his In-N-Out cath, he was able to void and had a PVR of 220 mL.  We will assess this again after another voiding to ensure no need for Foley catheter [DS]  1323 PVR . 5 - 6 episodes of loose stool here in the ED, 1/hr. Feeling more weakn now. Will call hospitalist for admission [DS]  1355 I consult with hospitalist agrees to admit [DS]    Clinical Course User Index [DS] Claudene Rover, MD     FINAL CLINICAL IMPRESSION(S) / ED DIAGNOSES   Final diagnoses:  Diarrhea, unspecified type  Urinary retention     Rx / DC Orders   ED Discharge Orders     None        Note:  This document was prepared using Dragon voice recognition software and may include unintentional  dictation errors.   Claudene Rover, MD 01/13/24 [phone]

## 2024-01-13 NOTE — Assessment & Plan Note (Signed)
 Recent full evaluation in the [facility] system for concern for borderline adrenal insufficiency is Had formal evaluation by endocrinology with patient not being felt to need replacement mineralocorticoid support BP stable at present Monitor

## 2024-01-13 NOTE — Progress Notes (Signed)
 PHARMACIST - PHYSICIAN COMMUNICATION  CONCERNING:  Enoxaparin  (Lovenox ) for DVT Prophylaxis    RECOMMENDATION: Patient was prescribed enoxaprin 40mg  q24 hours for VTE prophylaxis.   Filed Weights   01/13/24 0730  Weight: 113.4 kg (250 lb)    Body mass index is 32.98 kg/m.  Estimated Creatinine Clearance: 35.6 mL/min (A) (by C-G formula based on SCr of 2.69 mg/dL (H)).   Based on [facility] policy patient is candidate for enoxaparin  0.5mg /kg TBW SQ every 24 hours based on BMI being >30.   DESCRIPTION: Pharmacy has adjusted enoxaparin  dose per [facility] policy.  Patient is now receiving enoxaparin  0.5 mg/kg every 24 hours    Adriana JONETTA Bolster, PharmD Clinical Pharmacist  01/13/2024 3:58 PM

## 2024-01-13 NOTE — ED Triage Notes (Addendum)
 See First RN note.  Pt c/o urinary retention since last night and reports his feet have been swollen x several days.  Pain score 10/10.  Hx of stage 3 kidney disease and CHF.     Pt reports he was admitted last week at [facility] for intermittent syncope and CHF.

## 2024-01-13 NOTE — Assessment & Plan Note (Signed)
 PPI

## 2024-01-13 NOTE — Assessment & Plan Note (Signed)
 Cont ASA

## 2024-01-13 NOTE — H&P (Signed)
 History and Physical    Patient: Devin White FMW:[id number] DOB: 1955-08-17 DOA: 01/13/2024 DOS: the patient was seen and examined on 01/13/2024 PCP: Jimmy Charlie FERNS, MD  Patient coming from: Home  Chief Complaint:  Chief Complaint  Patient presents with   Urinary Retention   HPI: Devin White is a 68 y.o. male with medical history significant of end-stage liver cirrhosis status post transplant 2009, stage III CKD, HFpEF, type 2 diabetes, GERD, hypertension, BPH, history of CVA presenting with acute urinary retention, diarrhea, acute on chronic kidney disease.  Patient noted to have been admitted in the [facility] system July 2 23rd through July 29 for issues including syncope, borderline adrenal insufficiency, diarrhea.  Patient states he was discharged on medication changes including holding of home verapamil.  Patient reports having roughly 10-15 loose/watery bowel movements over the past 24 hours.  Does report remote history of C. difficile type infection like this in the past.  No fevers or chills.  Mild nausea as well as decreased p.o. intake.  Minimal abdominal pain.  Diarrhea predominantly watery.?  Trace blood per report.  No chest pain or shortness of breath.  No focal hemiparesis or confusion.  Patient denies any further episodes of syncope from discharge.  Has not resumed taking home verapamil.  Patient does report the diarrhea is similar to prior C. difficile infection.  No reported shortness of breath orthopnea or PND.  No reported chest pain. Presented to the ER afebrile, hemodynamically stable.  Satting well on room air.  White count 14.7, hemoglobin 13.4, platelets 243, creatinine 2.7.  C. difficile screen and GI panel pending.  CT of the abdomen pelvis grossly within normal limits. Review of Systems: As mentioned in the history of present illness. All other systems reviewed and are negative. Past Medical History:  Diagnosis Date   Arthritis    back and legs   Benign  prostatic hypertrophy    CHF (congestive heart failure) (HCC)    GERD (gastroesophageal reflux disease)    Gout    History of blood transfusion    pt has antibodies in his blood since previous transfusions   History of cirrhosis of liver S/P TRANSPLANT 2009   History of liver failure S/P TRANSPLANT   Hypertension    Left ureteral calculus    Renal disorder    Stroke [facility])    Past Surgical History:  Procedure Laterality Date   APPENDECTOMY     bone morrow biopsy     CHOLECYSTECTOMY  2007   CYSTO/ LEFT RETROGRADE PYELOGRAM/ LEFT URETERAL STENT PLACEMENT  03-05-2012  DR ALVARO [facility])   LEFT URETERAL CALCULI   CYSTOSCOPY W/ URETERAL STENT PLACEMENT  03/11/2012   Procedure: CYSTOSCOPY WITH STENT REPLACEMENT;  Surgeon: Ricardo ALVARO, MD;  Location: [facility];  Service: Urology;  Laterality: Left;   HERNIA REPAIR  2008   INCISION AND DRAINAGE ABSCESS N/A 09/09/2023   Procedure: INCISION AND DRAINAGE, ABSCESS;  Surgeon: Jordis Laneta FALCON, MD;  Location: [facility] ORS;  Service: General;  Laterality: N/A;  Lithotomy   LIVER BIOPSY     LIVER TRANSPLANT  11/24/2007   pt states doing well since liver transplant   LUMBAR DISC SURGERY     LUMBAR FUSION     removal of fistula     URETEROSCOPY  03/11/2012   Procedure: URETEROSCOPY;  Surgeon: Ricardo ALVARO, MD;  Location: [facility];  Service: Urology;  Laterality: Left;   STONE MANIPULATION, stone obtained  [phone] [facility]  Social History:  reports that he has never smoked. He has been exposed to tobacco smoke. He has never used smokeless tobacco. He reports that he does not drink alcohol and does not use drugs.  Allergies  Allergen Reactions   Levaquin  [Levofloxacin ] Shortness Of Breath    SOB and severe leg pain/weakness    Family History  Problem Relation Age of Onset   Cancer Mother        colon   Arthritis Mother    Vasculitis Mother    Hypertension Mother    Cancer Father        colon   Kidney  disease Father    Arthritis Father    Arthritis Brother    Alcohol abuse Maternal Aunt    Diabetes Maternal Aunt    Alcohol abuse Maternal Uncle    Diabetes Paternal Aunt    Alcohol abuse Maternal Uncle    Alcohol abuse Maternal Uncle     Prior to Admission medications   Medication Sig Start Date End Date Taking? Authorizing Provider  acetaminophen  (TYLENOL ) 325 MG tablet Take 650 mg by mouth every 6 (six) hours as needed for mild pain.    [provider]  amLODipine  (NORVASC ) 10 MG tablet Take 1 tablet (10 mg total) by mouth daily. 02/14/23   Jimmy Charlie FERNS, MD  aspirin  EC 81 MG tablet Take 1 tablet (81 mg total) by mouth daily. Swallow whole. 11/12/22   Josette Charlie, MD  Blood Glucose Monitoring Suppl DEVI 1 each by Does not apply route in the morning, at noon, and at bedtime. May substitute to any manufacturer covered by patient's insurance. 04/21/23   Patel, Sona, MD  colchicine  0.6 MG tablet Take 1 tablet (0.6 mg total) by mouth daily as needed. 05/01/23   Jimmy Charlie FERNS, MD  Continuous Glucose Sensor (FREESTYLE LIBRE 3 PLUS SENSOR) MISC Change sensor every 15 days. 05/01/23   Letvak, Richard I, MD  diclofenac  Sodium (VOLTAREN ) 1 % GEL APPLY 4 GRAMS TOPICALLY TO THE AFFECTED AREA FOUR TIMES DAILY 08/25/23   Jimmy Charlie FERNS, MD  fludrocortisone  (FLORINEF ) 0.1 MG tablet Take 1 tablet (0.1 mg total) by mouth daily. 12/25/23   Jimmy Charlie FERNS, MD  furosemide  (LASIX ) 40 MG tablet TAKE 1 TABLET(40 MG) BY MOUTH DAILY Patient taking differently: Take 40 mg by mouth daily as needed. 07/29/23   Jimmy Charlie FERNS, MD  gabapentin  (NEURONTIN ) 300 MG capsule Take 1 capsule (300 mg total) by mouth 3 (three) times daily. 05/01/23   Jimmy Charlie FERNS, MD  insulin  glargine (LANTUS ) 100 UNIT/ML Solostar Pen Inject 30 Units into the skin daily. 05/01/23   Jimmy Charlie FERNS, MD  insulin  lispro (HUMALOG  KWIKPEN) 100 UNIT/ML KwikPen 0-15 Units, Subcutaneous, 3 times daily before meals &  bedtime  CBG 70 - 120: 0 units  CBG 121 - 150: 2 units  CBG 151 - 200: 3 units  CBG 201 - 250: 5 units  CBG 251 - 300: 8 units  CBG 301 - 350: 11 units  CBG 351 - 400: 15 units  CBG > 400: call MD 04/21/23   Patel, Sona, MD  Insulin  Pen Needle 32G X 4 MM MISC 1 each by Does not apply route as directed. 06/13/23   Letvak, Richard I, MD  magnesium  oxide (MAG-OX) 400 (240 Mg) MG tablet Take 400 mg by mouth daily.    [provider]  methocarbamol  (ROBAXIN ) 500 MG tablet Take by mouth 2 (two) times daily. 01/03/23   [provider]  nortriptyline  (PAMELOR ) 10 MG capsule Take 10 mg by mouth 2 (two) times daily.    [provider]  omeprazole  (PRILOSEC) 20 MG capsule TAKE 1 CAPSULE(20 MG) BY MOUTH DAILY 12/05/23   Jimmy Charlie FERNS, MD  ondansetron  (ZOFRAN ) 4 MG tablet Take 1 tablet (4 mg total) by mouth 3 (three) times daily as needed for nausea or vomiting. 10/08/23   Jimmy Charlie FERNS, MD  rosuvastatin  (CRESTOR ) 20 MG tablet TAKE 1 TABLET(20 MG) BY MOUTH DAILY 12/08/23   Jimmy Charlie FERNS, MD  senna-docusate (SENOKOT-S) 8.6-50 MG tablet Take 1 tablet by mouth 2 (two) times daily. 09/14/23   Barbarann Nest, MD  tacrolimus  (PROGRAF ) 0.5 MG capsule Take 0.5-1 mg by mouth See admin instructions. Taking 0.5 mg in the morning and 1 mg at bedtime    [provider]  tamsulosin  (FLOMAX ) 0.4 MG CAPS capsule TAKE 1 CAPSULE(0.4 MG) BY MOUTH DAILY 11/11/23   Jimmy Charlie FERNS, MD  trimethoprim  (TRIMPEX ) 100 MG tablet Take 100 mg by mouth daily. 01/05/23   [provider]    Physical Exam: Vitals:   01/13/24 1245 01/13/24 1414 01/13/24 1415 01/13/24 1457  BP:  (!) 153/86  (!) 143/83  Pulse: 81 80  74  Resp:  16  18  Temp:   98.8 F (37.1 C) 97.9 F (36.6 C)  TempSrc:      SpO2: 94% 94%  98%  Weight:      Height:       Physical Exam Constitutional:      Appearance: He is obese.  HENT:     Head: Normocephalic and atraumatic.     Nose: Nose normal.      Mouth/Throat:     Mouth: Mucous membranes are moist.  Eyes:     Pupils: Pupils are equal, round, and reactive to light.  Cardiovascular:     Rate and Rhythm: Normal rate and regular rhythm.  Pulmonary:     Effort: Pulmonary effort is normal.  Abdominal:     General: Bowel sounds are normal.  Musculoskeletal:        General: Normal range of motion.  Skin:    General: Skin is warm.  Neurological:     General: No focal deficit present.  Psychiatric:        Mood and Affect: Mood normal.     Data Reviewed:  There are no new results to review at this time.  CT ABDOMEN PELVIS WO CONTRAST CLINICAL DATA:  acute urinary retention , lower abd pain, diarrhea. required I/O cath for large volume urine. liver transplant patient  EXAM: CT ABDOMEN AND PELVIS WITHOUT CONTRAST  TECHNIQUE: Multidetector CT imaging of the abdomen and pelvis was performed following the standard protocol without IV contrast.  RADIATION DOSE REDUCTION: This exam was performed according to the departmental dose-optimization program which includes automated exposure control, adjustment of the mA and/or kV according to patient size and/or use of iterative reconstruction technique.  COMPARISON:  September 09, 2023  FINDINGS: Of note, the lack of intravenous contrast limits evaluation of the solid organ parenchyma and vascularity.  Lower chest: No focal airspace consolidation or pleural effusion. Mild cardiomegaly. Posterior bibasilar dependent atelectasis. Right coronary atherosclerosis.  Hepatobiliary: No mass.Cholecystectomy no intrahepatic or extrahepatic biliary ductal dilation.  Pancreas: Diffuse fatty atrophy of the pancreatic parenchyma. No mass or ductal dilation. No peripancreatic inflammation or fluid collection.  Spleen: Normal size. No mass.  Adrenals/Urinary Tract: No adrenal masses. Renal cortical atrophy bilaterally. Multiple small  renal cysts. Punctate nonobstructive bilateral  nephrolithiasis. No hydronephrosis. The urinary bladder is distended without focal abnormality.  Stomach/Bowel: The stomach is decompressed without focal abnormality. No small bowel wall thickening or inflammation. No small bowel obstruction.Appendectomy. Descending and sigmoid colonic diverticulosis. No changes of acute diverticulitis.  Vascular/Lymphatic: No aortic aneurysm. Diffuse aortoiliac atherosclerosis. No intraabdominal or pelvic lymphadenopathy.  Reproductive: No prostatomegaly. No free pelvic fluid.  Other: No pneumoperitoneum, ascites, or mesenteric inflammation.  Musculoskeletal: No acute fracture or destructive lesion. L4 through S1 lumbosacral fusion hardware. Multilevel degenerative disc disease of the spine. Bilateral hip osteoarthritis.  IMPRESSION: 1. No acute intra-abdominal or pelvic abnormality. 2. Punctate nonobstructive bilateral nephrolithiasis. No hydronephrosis. 3. Descending and sigmoid colonic diverticulosis. No changes of acute diverticulitis.  Aortic Atherosclerosis (ICD10-I70.0).  Electronically Signed   By: Rogelia Myers M.D.   On: 01/13/2024 11:08  Lab Results  Component Value Date   WBC 14.7 (H) 01/13/2024   HGB 13.4 01/13/2024   HCT 40.5 01/13/2024   MCV 90.8 01/13/2024   PLT 243 01/13/2024   Last metabolic panel Lab Results  Component Value Date   GLUCOSE 135 (H) 01/13/2024   NA 138 01/13/2024   K 4.7 01/13/2024   CL 105 01/13/2024   CO2 21 (L) 01/13/2024   BUN 32 (H) 01/13/2024   CREATININE 2.69 (H) 01/13/2024   GFRNONAA 25 (L) 01/13/2024   CALCIUM  9.7 01/13/2024   PHOS 4.2 09/16/2023   PROT 6.6 01/13/2024   ALBUMIN 3.5 01/13/2024   BILITOT 1.0 01/13/2024   ALKPHOS 73 01/13/2024   AST 20 01/13/2024   ALT 21 01/13/2024   ANIONGAP 12 01/13/2024    Assessment and Plan: Diarrhea 10-15 episodes of watery diarrhea over the past 24 to 36 hours Noted prior history of C. difficile infection in the past Chronic  immunosuppression in setting of liver transplant is a confounding issue CT abdomen pelvis grossly stable White count 14.7 on presentation Placed on empiric Rocephin  and Flagyl  for infectious coverage Stool studies and C. difficile Otherwise monitor Reassess as appropriate  Type II diabetes mellitus with renal manifestations (HCC) Blood sugar 130s SSI Monitor  hx of stroke Cont ASA    Acute kidney injury superimposed on chronic kidney disease (HCC) Creatinine 2.7 today with baseline creatinine 1.9-2.2 Will gently hydrate in the setting of chronic HFpEF Hold nephrotoxic agents Trend renal function Monitor  Chronic diastolic heart failure (HCC) 2D echo July 2025 in the [facility] system with EF of 65 to 70% and grade 1 diastolic dysfunction Appears fairly euvolemic at present Gentle IV fluid hydration in setting of diarrhea Strict ins and outs and daily weights Monitor  Essential hypertension BP stable Titrate home regimen  Acute urinary retention Noted acute urinary retention with 1500 cc of urine removal status post In-N-Out cath CT imaging grossly stable Continue home Flomax  Monitor  Adrenal insufficiency (HCC) Recent full evaluation in the [facility] medical system for concern for borderline adrenal insufficiency is Had formal evaluation by endocrinology with patient not being felt to need replacement mineralocorticoid support BP stable at present Monitor  Immunosuppression (HCC) Continue home tacrolimus  in the setting of liver transplant 2010  GERD PPI       Advance Care Planning:   Code Status: Prior   Consults: None   Family Communication: No family at the bedside   Severity of Illness: The appropriate patient status for this patient is OBSERVATION. Observation status is judged to be reasonable and necessary in order to provide the required intensity of service to  ensure the patient's safety. The patient's presenting symptoms, physical exam findings, and  initial radiographic and laboratory data in the context of their medical condition is felt to place them at decreased risk for further clinical deterioration. Furthermore, it is anticipated that the patient will be medically stable for discharge from the hospital within 2 midnights of admission.   Author: Elspeth JINNY Masters, MD 01/13/2024 3:52 PM  For on call review [email or web address].

## 2024-01-13 NOTE — ED Notes (Addendum)
 Bladder scan showed greater than in bladder.

## 2024-01-13 NOTE — Assessment & Plan Note (Signed)
 Continue home tacrolimus  in the setting of liver transplant 2010

## 2024-01-13 NOTE — Assessment & Plan Note (Signed)
 10-15 episodes of watery diarrhea over the past 24 to 36 hours Noted prior history of C. difficile infection in the past Chronic immunosuppression in setting of liver transplant is a confounding issue CT abdomen pelvis grossly stable White count 14.7 on presentation Placed on empiric Rocephin  and Flagyl  for infectious coverage Stool studies and C. difficile Otherwise monitor Reassess as appropriate

## 2024-01-13 NOTE — Assessment & Plan Note (Signed)
 BP stable Titrate home regimen

## 2024-01-14 ENCOUNTER — Encounter: Admitting: Internal Medicine

## 2024-01-14 DIAGNOSIS — Z8673 Personal history of transient ischemic attack (TIA), and cerebral infarction without residual deficits: Secondary | ICD-10-CM | POA: Diagnosis not present

## 2024-01-14 DIAGNOSIS — E860 Dehydration: Secondary | ICD-10-CM | POA: Diagnosis present

## 2024-01-14 DIAGNOSIS — Z944 Liver transplant status: Secondary | ICD-10-CM | POA: Diagnosis not present

## 2024-01-14 DIAGNOSIS — I5032 Chronic diastolic (congestive) heart failure: Secondary | ICD-10-CM | POA: Diagnosis present

## 2024-01-14 DIAGNOSIS — Z8261 Family history of arthritis: Secondary | ICD-10-CM | POA: Diagnosis not present

## 2024-01-14 DIAGNOSIS — Z7952 Long term (current) use of systemic steroids: Secondary | ICD-10-CM | POA: Diagnosis not present

## 2024-01-14 DIAGNOSIS — Z7982 Long term (current) use of aspirin: Secondary | ICD-10-CM | POA: Diagnosis not present

## 2024-01-14 DIAGNOSIS — Z8249 Family history of ischemic heart disease and other diseases of the circulatory system: Secondary | ICD-10-CM | POA: Diagnosis not present

## 2024-01-14 DIAGNOSIS — Z841 Family history of disorders of kidney and ureter: Secondary | ICD-10-CM | POA: Diagnosis not present

## 2024-01-14 DIAGNOSIS — Z794 Long term (current) use of insulin: Secondary | ICD-10-CM | POA: Diagnosis not present

## 2024-01-14 DIAGNOSIS — N179 Acute kidney failure, unspecified: Secondary | ICD-10-CM | POA: Diagnosis present

## 2024-01-14 DIAGNOSIS — Z833 Family history of diabetes mellitus: Secondary | ICD-10-CM | POA: Diagnosis not present

## 2024-01-14 DIAGNOSIS — K7460 Unspecified cirrhosis of liver: Secondary | ICD-10-CM | POA: Diagnosis present

## 2024-01-14 DIAGNOSIS — K219 Gastro-esophageal reflux disease without esophagitis: Secondary | ICD-10-CM | POA: Diagnosis present

## 2024-01-14 DIAGNOSIS — N401 Enlarged prostate with lower urinary tract symptoms: Secondary | ICD-10-CM | POA: Diagnosis present

## 2024-01-14 DIAGNOSIS — D849 Immunodeficiency, unspecified: Secondary | ICD-10-CM | POA: Diagnosis present

## 2024-01-14 DIAGNOSIS — E2740 Unspecified adrenocortical insufficiency: Secondary | ICD-10-CM | POA: Diagnosis present

## 2024-01-14 DIAGNOSIS — E1122 Type 2 diabetes mellitus with diabetic chronic kidney disease: Secondary | ICD-10-CM | POA: Diagnosis present

## 2024-01-14 DIAGNOSIS — Z79899 Other long term (current) drug therapy: Secondary | ICD-10-CM | POA: Diagnosis not present

## 2024-01-14 DIAGNOSIS — Z888 Allergy status to other drugs, medicaments and biological substances status: Secondary | ICD-10-CM | POA: Diagnosis not present

## 2024-01-14 DIAGNOSIS — R339 Retention of urine, unspecified: Secondary | ICD-10-CM | POA: Diagnosis present

## 2024-01-14 DIAGNOSIS — N1831 Chronic kidney disease, stage 3a: Secondary | ICD-10-CM | POA: Diagnosis present

## 2024-01-14 DIAGNOSIS — R197 Diarrhea, unspecified: Secondary | ICD-10-CM | POA: Diagnosis present

## 2024-01-14 DIAGNOSIS — A040 Enteropathogenic Escherichia coli infection: Secondary | ICD-10-CM | POA: Diagnosis present

## 2024-01-14 DIAGNOSIS — I130 Hypertensive heart and chronic kidney disease with heart failure and stage 1 through stage 4 chronic kidney disease, or unspecified chronic kidney disease: Secondary | ICD-10-CM | POA: Diagnosis present

## 2024-01-14 DIAGNOSIS — M109 Gout, unspecified: Secondary | ICD-10-CM | POA: Diagnosis present

## 2024-01-14 LAB — COMPREHENSIVE METABOLIC PANEL WITH GFR
ALT: 18 U/L (ref 0–44)
AST: 18 U/L (ref 15–41)
Albumin: 3.2 g/dL — ABNORMAL LOW (ref 3.5–5.0)
Alkaline Phosphatase: 63 U/L (ref 38–126)
Anion gap: 12 (ref 5–15)
BUN: 25 mg/dL — ABNORMAL HIGH (ref 8–23)
CO2: 20 mmol/L — ABNORMAL LOW (ref 22–32)
Calcium: 9.3 mg/dL (ref 8.9–10.3)
Chloride: 107 mmol/L (ref 98–111)
Creatinine, Ser: 1.81 mg/dL — ABNORMAL HIGH (ref 0.61–1.24)
GFR, Estimated: 41 mL/min — ABNORMAL LOW (ref >60)
Glucose, Bld: 101 mg/dL — ABNORMAL HIGH (ref 70–99)
Potassium: 4.1 mmol/L (ref 3.5–5.1)
Sodium: 139 mmol/L (ref 135–145)
Total Bilirubin: 1 mg/dL (ref 0.0–1.2)
Total Protein: 6.2 g/dL — ABNORMAL LOW (ref 6.5–8.1)

## 2024-01-14 LAB — CBC
HCT: 37.3 % — ABNORMAL LOW (ref 39.0–52.0)
Hemoglobin: 12.3 g/dL — ABNORMAL LOW (ref 13.0–17.0)
MCH: 29.9 pg (ref 26.0–34.0)
MCHC: 33 g/dL (ref 30.0–36.0)
MCV: 90.8 fL (ref 80.0–100.0)
Platelets: 204 K/uL (ref 150–400)
RBC: 4.11 MIL/uL — ABNORMAL LOW (ref 4.22–5.81)
RDW: 14 % (ref 11.5–15.5)
WBC: 6.9 K/uL (ref 4.0–10.5)
nRBC: 0 % (ref 0.0–0.2)

## 2024-01-14 LAB — HIV ANTIBODY (ROUTINE TESTING W REFLEX): HIV Screen 4th Generation wRfx: NONREACTIVE

## 2024-01-14 MED ORDER — SODIUM CHLORIDE 0.9 % IV SOLN: INTRAVENOUS | Status: DC

## 2024-01-14 NOTE — Plan of Care (Signed)
  Problem: Education: Goal: Knowledge of General Education information will improve Description: Including pain rating scale, medication(s)/side effects and non-pharmacologic comfort measures Outcome: Progressing   Problem: Health Behavior/Discharge Planning: Goal: Ability to manage health-related needs will improve Outcome: Progressing   Problem: Clinical Measurements: Goal: Ability to maintain clinical measurements within normal limits will improve Outcome: Progressing Goal: Will remain free from infection Outcome: Progressing Goal: Diagnostic test results will improve Outcome: Progressing Goal: Respiratory complications will improve Outcome: Progressing Goal: Cardiovascular complication will be avoided Outcome: Progressing   Problem: Elimination: Goal: Will not experience complications related to bowel motility Outcome: Progressing Goal: Will not experience complications related to urinary retention Outcome: Progressing   Problem: Nutrition: Goal: Adequate nutrition will be maintained Outcome: Progressing   Problem: Coping: Goal: Level of anxiety will decrease Outcome: Progressing   Problem: Skin Integrity: Goal: Risk for impaired skin integrity will decrease Outcome: Progressing

## 2024-01-14 NOTE — Progress Notes (Signed)
 CCMD made this nurse aware of first degree heart block and then second degree heart block. Dr. Lawence made aware. New order for EKG.. Dr.Mansy made aware that EKG was completed and in patient's chart.

## 2024-01-14 NOTE — TOC Initial Note (Signed)
 Transition of Care [facility]) - Initial/Assessment Note    Patient Details  Name: Devin White MRN: [id number] Date of Birth: 1955/11/22  Transition of Care [facility]) CM/SW Contact:    Dalia GORMAN Fuse, RN Phone Number: 01/14/2024, 9:56 AM  Clinical Narrative:                  Patient is from home. He admitted with 10 + episodes a day of diarrhea. He is being treated empirically with IV Rocephin  and IV Flagy. No TOC needs, please consult TOC if needs identified.        Patient Goals and CMS Choice            Expected Discharge Plan and Services                                              Prior Living Arrangements/Services                       Activities of Daily Living   ADL Screening (condition at time of admission) Independently performs ADLs?: Yes (appropriate for developmental age) Is the patient deaf or have difficulty hearing?: No Does the patient have difficulty seeing, even when wearing glasses/contacts?: No Does the patient have difficulty concentrating, remembering, or making decisions?: No  Permission Sought/Granted                  Emotional Assessment              Admission diagnosis:  Diarrhea [R19.7] Urinary retention [R33.9] Diarrhea, unspecified type [R19.7] Patient Active Problem List   Diagnosis Date Noted   Diarrhea 01/13/2024   Acute urinary retention 01/13/2024   Adrenal insufficiency (HCC) 12/25/2023   Class 2 obesity due to excess calories with body mass index (BMI) of 35.0 to 35.9 in adult 09/10/2023   Necrotizing soft tissue infection 09/09/2023   Perianal abscess 09/07/2023   Sepsis, polymicrobial (HCC) 09/06/2023   Acute kidney injury superimposed on chronic kidney disease (HCC) 09/06/2023   Near syncope 09/06/2023   Essential hypertension 09/06/2023   Type 2 diabetes mellitus with peripheral neuropathy (HCC) 09/06/2023   Gout 09/06/2023   Dyslipidemia 09/06/2023   Cervical radiculopathy 05/01/2023    Diabetes mellitus treated with insulin  and oral medication (HCC) 05/01/2023   Hyperosmolar hyperglycemic state (HHS) (HCC) 04/18/2023   hx of stroke 04/18/2023   Leukocytosis 04/18/2023   Type II diabetes mellitus with renal manifestations (HCC) 04/18/2023   HLD (hyperlipidemia) 04/18/2023   Newly diagnosed diabetes (HCC) 02/14/2023   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) 11/27/2022   Acquired thrombophilia (HCC) 11/27/2022   Elevated TSH 11/27/2022   Postural dizziness with presyncope 11/16/2022   History of superior mesenteric vein thrombosis, postprocedural 2022 11/16/2022   History of recent stroke 11/16/2022   Pain of both sacroiliac joints 11/06/2022   Elevated liver function tests 11/06/2022   Left sided numbness 11/14/2021   Obesity (BMI 30-39.9) 10/27/2021   Stage 3a chronic kidney disease (CKD) (HCC) 10/26/2021   Hyperbilirubinemia 10/26/2021   Chronic diastolic heart failure (HCC) 09/05/2021   Gastric intestinal metaplasia 01/01/2021   Sick euthyroidism 10/03/2020   Achilles tendon mass 09/25/2020   Recurrent UTI 03/26/2019   Pulmonary nodule 08/04/2018   History of liver transplant (HCC) 10/10/2017   Advance directive discussed with patient 10/10/2017  Stage 3b chronic kidney disease (HCC) 10/09/2016   Mallory-Weiss tear 04/16/2016   Long-term use of immunosuppressant medication 04/08/2016   Sleep apnea 10/06/2015   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   Immunosuppression (HCC) 06/01/2012   Hypertension    Routine general medical examination at a health care facility 04/26/2011   Chronic tophaceous gout 12/21/2008   BPH (benign prostatic hyperplasia) 12/21/2008   ALLERGIC RHINITIS 05/22/2007   GERD 05/22/2007   HIATAL HERNIA 05/22/2007   IRRITABLE BOWEL SYNDROME, HX OF 05/22/2007   RENAL CALCULUS, HX OF 05/22/2007   PCP:  Jimmy Charlie FERNS, MD Pharmacy:   [id number] [place], [place] - [address] AT [facility] [address] [place] [address] Phone: [phone] Fax: [phone]     Social Drivers of Health (SDOH) Social History: SDOH Screenings   Food Insecurity: No Food Insecurity (01/13/2024)  Housing: Low Risk  (01/13/2024)  Transportation Needs: No Transportation Needs (01/13/2024)  Utilities: Not At Risk (01/13/2024)  Alcohol Screen: Low Risk  (10/02/2023)  Depression (PHQ2-9): Low Risk  (12/25/2023)  Financial Resource Strain: Low Risk  (01/01/2024)   Received from [facility]  Physical Activity: Insufficiently Active (10/02/2023)  Social Connections: Patient Declined (01/13/2024)  Stress: No Stress Concern Present (10/02/2023)  Tobacco Use: Low Risk  (01/13/2024)   SDOH Interventions:     Readmission Risk Interventions    11/09/2022    1:38 PM 01/31/2022    9:58 AM 11/06/2021   11:09 AM  Readmission Risk Prevention Plan  Transportation Screening Complete Complete Complete  PCP or Specialist Appt within 5-7 Days Complete  Complete  PCP or Specialist Appt within 3-5 Days  Complete   Home Care Screening Complete  Complete  Medication Review (RN CM) Referral to Pharmacy  Referral to Pharmacy  Social Work Consult for Recovery Care Planning/Counseling  Complete   Palliative Care Screening  Not Applicable   Medication Review Oceanographer)  Complete

## 2024-01-14 NOTE — Care Management Obs Status (Signed)
 MEDICARE OBSERVATION STATUS NOTIFICATION   Patient Details  Name: MANPREET STREY MRN: [id number] Date of Birth: 1955/08/09   Medicare Observation Status Notification Given:  Chaney BRANDY CHRISTIANE LELON, CMA 01/14/2024, 10:18 AM

## 2024-01-14 NOTE — Progress Notes (Signed)
 Got a call from CCMD.pt was running 4 run of non sustained vtach, afib and bundle branch block and back to sinus rhythm. patient is alert and oriented. no any other symptoms noted. Md Djan made aware. No any new order received. Denies any pain.

## 2024-01-14 NOTE — Progress Notes (Signed)
 Progress Note   Patient: Devin White FMW:[id number] DOB: December 03, 1955 DOA: 01/13/2024     0 DOS: the patient was seen and examined on 01/14/2024   Brief hospital course: ROPER TOLSON is a 68 y.o. male with medical history significant of end-stage liver cirrhosis status post transplant 2009, stage III CKD, HFpEF, type 2 diabetes, GERD, hypertension, BPH, history of CVA presenting with acute urinary retention, diarrhea, acute on chronic kidney disease.  Patient noted to have been admitted in the [facility] system July 2 23rd through July 29 for issues including syncope, borderline adrenal insufficiency, diarrhea.  Patient states he was discharged on medication changes including holding of home verapamil.  Patient reports having roughly 10-15 loose/watery bowel movements over the past 24 hours.  Does report remote history of C. difficile type infection like this in the past.  No fevers or chills.  Mild nausea as well as decreased p.o. intake.  Minimal abdominal pain.  Diarrhea predominantly watery.?  Trace blood per report.  No chest pain or shortness of breath.  No focal hemiparesis or confusion.  Patient denies any further episodes of syncope from discharge.  Has not resumed taking home verapamil.  Patient does report the diarrhea is similar to prior C. difficile infection.  No reported shortness of breath orthopnea or PND.  No reported chest pain. Presented to the ER afebrile, hemodynamically stable.  Satting well on room air.  White count 14.7, hemoglobin 13.4, platelets 243, creatinine 2.7.  C. difficile screen and GI panel pending.  CT of the abdomen pelvis grossly within normal limits.  Assessment and Plan: Diarrhea secondary to enteropathogenic E. coli 10-15 episodes of watery diarrhea over the past 24 to 36 hours Noted prior history of C. difficile infection in the past Chronic immunosuppression in setting of liver transplant is a confounding issue CT abdomen pelvis grossly stable White count  14.7 on presentation Continue current antibiotics   Type II diabetes mellitus with renal manifestations (HCC) Blood sugar 130s SSI Monitor   hx of stroke Cont ASA      Acute kidney injury superimposed on chronic kidney disease (HCC) Presented with creatinine 2.7 Will gently hydrate in the setting of chronic HFpEF Hold nephrotoxic agents Continue IV fluid Monitor renal function closely   Chronic diastolic heart failure (HCC) 2D echo July 2025 in the [facility] system with EF of 65 to 70% and grade 1 diastolic dysfunction Appears fairly euvolemic at present Gentle IV fluid hydration in setting of diarrhea Strict ins and outs and daily weights Monitor   Essential hypertension BP stable Titrate home regimen   Acute urinary retention Noted acute urinary retention with 1500 cc of urine removal status post In-N-Out cath CT imaging grossly stable Continue home Flomax  Monitor   Adrenal insufficiency (HCC) Recent full evaluation in the [facility] medical system for concern for borderline adrenal insufficiency is Had formal evaluation by endocrinology with patient not being felt to need replacement mineralocorticoid support BP stable at present Monitor   Immunosuppression (HCC) Continue home tacrolimus  in the setting of liver transplant 2010   GERD PPI    Advance Care Planning:   Code Status: Prior    Consults: None    Family Communication: No family at the bedside    Subjective:   Patient seen and examined at bedside this morning Admits to improvement in diarrhea He tells me he still feels weak Still continues to be on IV antibiotics as well as IV fluid Renal function improving GI panel showing E. coli enteropathogenic  Physical Exam:  HENT:     Head: Normocephalic and atraumatic.     Nose: Nose normal.     Mouth/Throat:     Mouth: Mucous membranes are moist.  Eyes:     Pupils: Pupils are equal, round, and reactive to light.  Cardiovascular:     Rate and Rhythm:  Normal rate and regular rhythm.  Pulmonary:     Effort: Pulmonary effort is normal.  Abdominal:     General: Bowel sounds are normal.  Musculoskeletal:        General: Normal range of motion.  Skin:    General: Skin is warm.  Neurological:     General: No focal deficit present.  Psychiatric:        Mood and Affect: Mood normal.  Data Reviewed: I have reviewed patient's abdominal CT scan    Latest Ref Rng & Units 01/14/2024    5:03 AM 01/13/2024    8:00 AM 09/16/2023    1:01 PM  CBC  WBC 4.0 - 10.5 K/uL 6.9  14.7  21.0 Repeated and verified X2.   Hemoglobin 13.0 - 17.0 g/dL 87.6  86.5  84.9   Hematocrit 39.0 - 52.0 % 37.3  40.5  45.3   Platelets 150 - 400 K/uL 204  243  420.0        Latest Ref Rng & Units 01/14/2024    5:03 AM 01/13/2024    8:00 AM 09/16/2023    1:01 PM  BMP  Glucose 70 - 99 mg/dL 898  864  823   BUN 8 - 23 mg/dL 25  32  28   Creatinine 0.61 - 1.24 mg/dL 8.18  7.30  7.73   Sodium 135 - 145 mmol/L 139  138  139   Potassium 3.5 - 5.1 mmol/L 4.1  4.7  5.0   Chloride 98 - 111 mmol/L 107  105  100   CO2 22 - 32 mmol/L 20  21  25    Calcium  8.9 - 10.3 mg/dL 9.3  9.7  9.2      Vitals:   01/14/24 0055 01/14/24 0546 01/14/24 0802 01/14/24 1207  BP: 125/70 (!) 158/87 (!) 149/85 (!) 142/84  Pulse: 71 71 66 67  Resp: 18 18 18 18   Temp: 97.7 F (36.5 C) 97.8 F (36.6 C) 98.2 F (36.8 C) 97.9 F (36.6 C)  TempSrc: Oral Oral Oral   SpO2: 92% 94% 94% 94%  Weight:      Height:         Family Communication: None at bedside  Disposition: Status is: Inpatient   Planned Discharge Destination:   Time spent: 52 minutes  Author: Drue ONEIDA Potter, MD 01/14/2024 2:55 PM  For on call review [email or web address].

## 2024-01-14 NOTE — Plan of Care (Signed)
 Alert and orient x4 able to make needs known. Pt in bed resting. No complaints noted

## 2024-01-15 DIAGNOSIS — E860 Dehydration: Secondary | ICD-10-CM | POA: Diagnosis not present

## 2024-01-15 LAB — BASIC METABOLIC PANEL WITH GFR
Anion gap: 8 (ref 5–15)
BUN: 21 mg/dL (ref 8–23)
CO2: 22 mmol/L (ref 22–32)
Calcium: 9.2 mg/dL (ref 8.9–10.3)
Chloride: 111 mmol/L (ref 98–111)
Creatinine, Ser: 1.51 mg/dL — ABNORMAL HIGH (ref 0.61–1.24)
GFR, Estimated: 51 mL/min — ABNORMAL LOW (ref >60)
Glucose, Bld: 101 mg/dL — ABNORMAL HIGH (ref 70–99)
Potassium: 4 mmol/L (ref 3.5–5.1)
Sodium: 141 mmol/L (ref 135–145)

## 2024-01-15 LAB — CBC WITH DIFFERENTIAL/PLATELET
Abs Immature Granulocytes: 0.02 K/uL (ref 0.00–0.07)
Basophils Absolute: 0.1 K/uL (ref 0.0–0.1)
Basophils Relative: 1 %
Eosinophils Absolute: 1.1 K/uL — ABNORMAL HIGH (ref 0.0–0.5)
Eosinophils Relative: 17 %
HCT: 38.2 % — ABNORMAL LOW (ref 39.0–52.0)
Hemoglobin: 12.9 g/dL — ABNORMAL LOW (ref 13.0–17.0)
Immature Granulocytes: 0 %
Lymphocytes Relative: 22 %
Lymphs Abs: 1.5 K/uL (ref 0.7–4.0)
MCH: 30.3 pg (ref 26.0–34.0)
MCHC: 33.8 g/dL (ref 30.0–36.0)
MCV: 89.7 fL (ref 80.0–100.0)
Monocytes Absolute: 0.9 K/uL (ref 0.1–1.0)
Monocytes Relative: 13 %
Neutro Abs: 3.2 K/uL (ref 1.7–7.7)
Neutrophils Relative %: 47 %
Platelets: 192 K/uL (ref 150–400)
RBC: 4.26 MIL/uL (ref 4.22–5.81)
RDW: 13.7 % (ref 11.5–15.5)
WBC: 6.9 K/uL (ref 4.0–10.5)
nRBC: 0 % (ref 0.0–0.2)

## 2024-01-15 MED ORDER — METRONIDAZOLE 500 MG PO TABS: 500.0000 mg | ORAL_TABLET | Freq: Three times a day (TID) | ORAL | 0 refills | Status: AC

## 2024-01-15 MED ORDER — TACROLIMUS 1 MG PO CAPS: 1.0000 mg | ORAL_CAPSULE | Freq: Every day | ORAL | Status: DC

## 2024-01-15 MED ORDER — TACROLIMUS 0.5 MG PO CAPS: 0.5000 mg | ORAL_CAPSULE | ORAL | Status: DC

## 2024-01-15 MED ORDER — AZITHROMYCIN 500 MG PO TABS: 500.0000 mg | ORAL_TABLET | Freq: Every day | ORAL | 0 refills | Status: AC

## 2024-01-15 MED ORDER — MELATONIN 5 MG PO TABS
5.0000 mg | ORAL_TABLET | Freq: Every evening | ORAL | Status: DC | PRN
  Administered 2024-01-15: 5 mg via ORAL
  Filled 2024-01-15: qty 1

## 2024-01-15 MED ORDER — TACROLIMUS 0.5 MG PO CAPS
0.5000 mg | ORAL_CAPSULE | Freq: Every morning | ORAL | Status: DC
  Administered 2024-01-15: 0.5 mg via ORAL
  Filled 2024-01-15: qty 1

## 2024-01-15 NOTE — Plan of Care (Signed)
  Problem: Education: Goal: Knowledge of General Education information will improve Description: Including pain rating scale, medication(s)/side effects and non-pharmacologic comfort measures 01/15/2024 0352 by Rubbie Arnaldo RAMAN, RN Outcome: Progressing 01/15/2024 0352 by Rubbie Arnaldo RAMAN, RN Outcome: Progressing   Problem: Health Behavior/Discharge Planning: Goal: Ability to manage health-related needs will improve 01/15/2024 0352 by Rubbie Arnaldo RAMAN, RN Outcome: Progressing 01/15/2024 0352 by Rubbie Arnaldo RAMAN, RN Outcome: Progressing   Problem: Clinical Measurements: Goal: Ability to maintain clinical measurements within normal limits will improve 01/15/2024 0352 by Rubbie Arnaldo RAMAN, RN Outcome: Progressing 01/15/2024 0352 by Rubbie Arnaldo RAMAN, RN Outcome: Progressing Goal: Will remain free from infection 01/15/2024 0352 by Rubbie Arnaldo RAMAN, RN Outcome: Progressing 01/15/2024 0352 by Rubbie Arnaldo RAMAN, RN Outcome: Progressing Goal: Diagnostic test results will improve Outcome: Progressing Goal: Respiratory complications will improve Outcome: Progressing Goal: Cardiovascular complication will be avoided Outcome: Progressing   Problem: Activity: Goal: Risk for activity intolerance will decrease Outcome: Progressing   Problem: Nutrition: Goal: Adequate nutrition will be maintained Outcome: Progressing   Problem: Coping: Goal: Level of anxiety will decrease Outcome: Progressing   Problem: Pain Managment: Goal: General experience of comfort will improve and/or be controlled Outcome: Progressing   Problem: Elimination: Goal: Will not experience complications related to bowel motility Outcome: Progressing Goal: Will not experience complications related to urinary retention Outcome: Progressing   Problem: Safety: Goal: Ability to remain free from injury will improve Outcome: Progressing   Problem: Skin Integrity: Goal: Risk for impaired skin integrity will  decrease Outcome: Progressing

## 2024-01-15 NOTE — TOC Transition Note (Signed)
 Transition of Care [facility]) - Discharge Note   Patient Details  Name: Devin White MRN: [id number] Date of Birth: [birth year]  Transition [facility]) CM/SW Contact:  Dalia GORMAN Fuse, RN Phone Number: 01/15/2024, 11:04 AM   Clinical Narrative:     Patient is medically clear to discharge to home. No TOC needs identified.   Final next level of care: Home/Self Care Barriers to Discharge: Barriers Resolved   Patient Goals and CMS Choice            Discharge Placement                       Discharge Plan and Services Additional resources added to the After Visit Summary for                                       Social Drivers of Health (SDOH) Interventions SDOH Screenings   Food Insecurity: No Food Insecurity (01/13/2024)  Housing: Low Risk  (01/13/2024)  Transportation Needs: No Transportation Needs (01/13/2024)  Utilities: Not At Risk (01/13/2024)  Alcohol Screen: Low Risk  (10/02/2023)  Depression (PHQ2-9): Low Risk  (12/25/2023)  Financial Resource Strain: Low Risk  (01/01/2024)   Received from [facility]  Physical Activity: Insufficiently Active (10/02/2023)  Social Connections: Patient Declined (01/13/2024)  Stress: No Stress Concern Present (10/02/2023)  Tobacco Use: Low Risk  (01/13/2024)     Readmission Risk Interventions    11/09/2022    1:38 PM 01/31/2022    9:58 AM 11/06/2021   11:09 AM  Readmission Risk Prevention Plan  Transportation Screening Complete Complete Complete  PCP or Specialist Appt within 5-7 Days Complete  Complete  PCP or Specialist Appt within 3-5 Days  Complete   Home Care Screening Complete  Complete  Medication Review (RN CM) Referral to Pharmacy  Referral to Pharmacy  Social Work Consult for Recovery Care Planning/Counseling  Complete   Palliative Care Screening  Not Applicable   Medication Review Oceanographer)  Complete

## 2024-01-15 NOTE — Discharge Summary (Signed)
 Physician Discharge Summary   Patient: Devin White MRN: [id number] DOB: September 10, 1955  Admit date:     01/13/2024  Discharge date: 01/15/24  Discharge Physician: Drue ONEIDA Potter   PCP: Jimmy Charlie FERNS, MD   Recommendations at discharge:  Follow-up with outpatient physicians  Discharge Diagnoses: Diarrhea secondary to enteropathogenic E. coli Type II diabetes mellitus with renal manifestations (HCC) hx of stroke Acute kidney injury superimposed on chronic kidney disease (HCC) Chronic diastolic heart failure (HCC) Essential hypertension Acute urinary retention Adrenal insufficiency (HCC) Immunosuppression (HCC) GERD  Brief hospital course: Devin White is a 68 y.o. male with medical history significant of end-stage liver cirrhosis status post transplant 2009, stage III CKD, HFpEF, type 2 diabetes, GERD, hypertension, BPH, history of CVA presenting with acute urinary retention, diarrhea, acute on chronic kidney disease.  Patient noted to have been admitted in the [facility] system July 2 23rd through July 29 for issues including syncope, borderline adrenal insufficiency, diarrhea.  Patient reports having roughly 10-15 loose/watery bowel movements over 24 hours.  GI panel showed findings of enteropathogenic E. coli.  CT scan of the abdomen did not show any acute intra-abdominal pathology.  Patient's abdominal symptoms are improved and currently patient is doing much better.  Foley catheter was removed and patient passed voiding trial several hours before discharge.  Patient is therefore being discharged today to follow up with outpatient physicians.    Consultants: None Procedures performed: None Disposition: Home Diet recommendation:  Cardiac diet DISCHARGE MEDICATION: Allergies as of 01/15/2024       Reactions   Levaquin  [levofloxacin ] Shortness Of Breath   SOB and severe leg pain/weakness        Medication List     STOP taking these medications    amLODipine  10 MG  tablet Commonly known as: NORVASC    furosemide  40 MG tablet Commonly known as: LASIX    lisinopril  40 MG tablet Commonly known as: ZESTRIL        TAKE these medications    Accu-Chek Guide Test test strip Generic drug: glucose blood daily.   Accu-Chek Softclix Lancets lancets daily.   acetaminophen  325 MG tablet Commonly known as: TYLENOL  Take 650 mg by mouth every 6 (six) hours as needed for mild pain.   aspirin  EC 81 MG tablet Take 1 tablet (81 mg total) by mouth daily. Swallow whole.   azithromycin  500 MG tablet Commonly known as: Zithromax  Take 1 tablet (500 mg total) by mouth daily for 3 days.   Blood Glucose Monitoring Suppl Devi 1 each by Does not apply route in the morning, at noon, and at bedtime. May substitute to any manufacturer covered by patient's insurance.   colchicine  0.6 MG tablet Take 1 tablet (0.6 mg total) by mouth daily as needed.   cyanocobalamin 500 MCG tablet Commonly known as: VITAMIN B12 Take 500 mcg by mouth.  Take 1 tablet (500 mcg total) by mouth once daily   diclofenac  Sodium 1 % Gel Commonly known as: VOLTAREN  APPLY 4 GRAMS TOPICALLY TO THE AFFECTED AREA FOUR TIMES DAILY   fludrocortisone  0.1 MG tablet Commonly known as: FLORINEF  Take 1 tablet (0.1 mg total) by mouth daily.   fluticasone 50 MCG/ACT nasal spray Commonly known as: FLONASE Place 1 spray into both nostrils every 12 (twelve) hours.   FreeStyle Libre 3 Plus Sensor Misc Change sensor every 15 days.   gabapentin  100 MG capsule Commonly known as: NEURONTIN  Take 100 mg by mouth daily.   insulin  glargine 100 UNIT/ML Solostar Pen Commonly known  as: LANTUS  Inject 30 Units into the skin daily.   insulin  lispro 100 UNIT/ML KwikPen Commonly known as: HumaLOG  KwikPen 0-15 Units, Subcutaneous, 3 times daily before meals & bedtime  CBG 70 - 120: 0 units  CBG 121 - 150: 2 units  CBG 151 - 200: 3 units  CBG 201 - 250: 5 units  CBG 251 - 300: 8 units  CBG 301 - 350: 11  units  CBG 351 - 400: 15 units  CBG > 400: call MD   Insulin  Pen Needle 32G X 4 MM Misc 1 each by Does not apply route as directed.   lidocaine  4 % Place 2 patches onto the skin. Place 2 patches onto the skin daily for 14 days Apply patch to the most painful area for up to 12 hours in a 24 hours period.   magnesium  oxide 400 (240 Mg) MG tablet Commonly known as: MAG-OX Take 400 mg by mouth daily.   methocarbamol  500 MG tablet Commonly known as: ROBAXIN  Take by mouth 2 (two) times daily.   metroNIDAZOLE  500 MG tablet Commonly known as: FLAGYL  Take 1 tablet (500 mg total) by mouth 3 (three) times daily for 5 days.   nortriptyline  10 MG capsule Commonly known as: PAMELOR  Take 10 mg by mouth 2 (two) times daily.   omeprazole  20 MG capsule Commonly known as: PRILOSEC TAKE 1 CAPSULE(20 MG) BY MOUTH DAILY   ondansetron  4 MG tablet Commonly known as: ZOFRAN  Take 1 tablet (4 mg total) by mouth 3 (three) times daily as needed for nausea or vomiting.   potassium chloride  10 MEQ tablet Commonly known as: KLOR-CON  M Take 10 mEq by mouth.  Take 1 tablet (10 mEq total) by mouth once daily as needed Take potassium ONLY IF you take the Lasix    predniSONE  2.5 MG tablet Commonly known as: DELTASONE  Take by mouth.   rosuvastatin  20 MG tablet Commonly known as: CRESTOR  TAKE 1 TABLET(20 MG) BY MOUTH DAILY   senna-docusate 8.6-50 MG tablet Commonly known as: Senokot-S Take 1 tablet by mouth 2 (two) times daily.   tacrolimus  0.5 MG capsule Commonly known as: PROGRAF  Take 0.5-1 mg by mouth See admin instructions. Taking 0.5 mg in the morning and 1 mg at bedtime   tamsulosin  0.4 MG Caps capsule Commonly known as: FLOMAX  TAKE 1 CAPSULE(0.4 MG) BY MOUTH DAILY   trimethoprim  100 MG tablet Commonly known as: TRIMPEX  Take 100 mg by mouth daily.   verapamil 120 MG CR tablet Commonly known as: CALAN-SR Take 240 mg by mouth daily.        Follow-up Information     Jimmy Charlie FERNS, MD. Go on 01/26/2024.   Specialties: Internal Medicine, Pediatrics Why: @11am  Contact information: [address] [place] [address] [phone]                Discharge Exam: Devin White   01/13/24 0730  Weight: 113.4 kg   HENT:     Head: Normocephalic and atraumatic.     Nose: Nose normal.     Mouth/Throat:     Mouth: Mucous membranes are moist.  Eyes:     Pupils: Pupils are equal, round, and reactive to light.  Cardiovascular:     Rate and Rhythm: Normal rate and regular rhythm.  Pulmonary:     Effort: Pulmonary effort is normal.  Abdominal:     General: Bowel sounds are normal.  Musculoskeletal:        General: Normal range of motion.  Skin:    General: Skin is warm.  Neurological:     General: No focal deficit present.  Psychiatric:      Condition at discharge: good  The results of significant diagnostics from this hospitalization (including imaging, microbiology, ancillary and laboratory) are listed below for reference.   Imaging Studies: CT ABDOMEN PELVIS WO CONTRAST Result Date: 01/13/2024 CLINICAL DATA:  acute urinary retention , lower abd pain, diarrhea. required I/O cath for large volume urine. liver transplant patient EXAM: CT ABDOMEN AND PELVIS WITHOUT CONTRAST TECHNIQUE: Multidetector CT imaging of the abdomen and pelvis was performed following the standard protocol without IV contrast. RADIATION DOSE REDUCTION: This exam was performed according to the departmental dose-optimization program which includes automated exposure control, adjustment of the mA and/or kV according to patient size and/or use of iterative reconstruction technique. COMPARISON:  September 09, 2023 FINDINGS: Of note, the lack of intravenous contrast limits evaluation of the solid organ parenchyma and vascularity. Lower chest: No focal airspace consolidation or pleural effusion. Mild cardiomegaly. Posterior bibasilar dependent atelectasis. Right coronary atherosclerosis.  Hepatobiliary: No mass.Cholecystectomy no intrahepatic or extrahepatic biliary ductal dilation. Pancreas: Diffuse fatty atrophy of the pancreatic parenchyma. No mass or ductal dilation. No peripancreatic inflammation or fluid collection. Spleen: Normal size. No mass. Adrenals/Urinary Tract: No adrenal masses. Renal cortical atrophy bilaterally. Multiple small renal cysts. Punctate nonobstructive bilateral nephrolithiasis. No hydronephrosis. The urinary bladder is distended without focal abnormality. Stomach/Bowel: The stomach is decompressed without focal abnormality. No small bowel wall thickening or inflammation. No small bowel obstruction.Appendectomy. Descending and sigmoid colonic diverticulosis. No changes of acute diverticulitis. Vascular/Lymphatic: No aortic aneurysm. Diffuse aortoiliac atherosclerosis. No intraabdominal or pelvic lymphadenopathy. Reproductive: No prostatomegaly. No free pelvic fluid. Other: No pneumoperitoneum, ascites, or mesenteric inflammation. Musculoskeletal: No acute fracture or destructive lesion. L4 through S1 lumbosacral fusion hardware. Multilevel degenerative disc disease of the spine. Bilateral hip osteoarthritis. IMPRESSION: 1. No acute intra-abdominal or pelvic abnormality. 2. Punctate nonobstructive bilateral nephrolithiasis. No hydronephrosis. 3. Descending and sigmoid colonic diverticulosis. No changes of acute diverticulitis. Aortic Atherosclerosis (ICD10-I70.0). Electronically Signed   By: Rogelia Myers M.D.   On: 01/13/2024 11:08    Microbiology: Results for orders placed or performed during the hospital encounter of 01/13/24  C Difficile Quick Screen w PCR reflex     Status: None   Collection Time: 01/13/24 12:30 PM   Specimen: STOOL  Result Value Ref Range Status   C Diff antigen NEGATIVE NEGATIVE Final   C Diff toxin NEGATIVE NEGATIVE Final   C Diff interpretation No C. difficile detected.  Final    Comment: Performed at [facility], [address] [place], [place] [address]  Gastrointestinal Panel by PCR , Stool     Status: Abnormal   Collection Time: 01/13/24 12:30 PM   Specimen: STOOL  Result Value Ref Range Status   Campylobacter species NOT DETECTED NOT DETECTED Final   Plesimonas shigelloides NOT DETECTED NOT DETECTED Final   Salmonella species NOT DETECTED NOT DETECTED Final   Yersinia enterocolitica NOT DETECTED NOT DETECTED Final   Vibrio species NOT DETECTED NOT DETECTED Final   Vibrio cholerae NOT DETECTED NOT DETECTED Final   Enteroaggregative E coli (EAEC) NOT DETECTED NOT DETECTED Final   Enteropathogenic E coli (EPEC) DETECTED (A) NOT DETECTED Final    Comment: RESULT CALLED TO, READ BACK BY AND VERIFIED WITH: Dontario NEWTON 01/13/24 1500 KLW    Enterotoxigenic E coli (ETEC) NOT DETECTED NOT DETECTED Final   Shiga like toxin producing E coli (  STEC) NOT DETECTED NOT DETECTED Final   Shigella/Enteroinvasive E coli (EIEC) NOT DETECTED NOT DETECTED Final   Cryptosporidium NOT DETECTED NOT DETECTED Final   Cyclospora cayetanensis NOT DETECTED NOT DETECTED Final   Entamoeba histolytica NOT DETECTED NOT DETECTED Final   Giardia lamblia NOT DETECTED NOT DETECTED Final   Adenovirus F40/41 NOT DETECTED NOT DETECTED Final   Astrovirus NOT DETECTED NOT DETECTED Final   Norovirus GI/GII NOT DETECTED NOT DETECTED Final   Rotavirus A NOT DETECTED NOT DETECTED Final   Sapovirus (I, II, IV, and V) NOT DETECTED NOT DETECTED Final    Comment: Performed at [facility], [address] [place], [place] [address]    Labs: CBC: Recent Labs  Lab 01/13/24 0800 01/14/24 0503 01/15/24 0443  WBC 14.7* 6.9 6.9  NEUTROABS  --   --  3.2  HGB 13.4 12.3* 12.9*  HCT 40.5 37.3* 38.2*  MCV 90.8 90.8 89.7  PLT 243 204 192   Basic Metabolic Panel: Recent Labs  Lab 01/13/24 0800 01/14/24 0503 01/15/24 0443  NA 138 139 141  K 4.7 4.1 4.0  CL 105 107 111  CO2 21* 20* 22  GLUCOSE 135* 101* 101*  BUN 32* 25* 21   CREATININE 2.69* 1.81* 1.51*  CALCIUM  9.7 9.3 9.2   Liver Function Tests: Recent Labs  Lab 01/13/24 0800 01/14/24 0503  AST 20 18  ALT 21 18  ALKPHOS 73 63  BILITOT 1.0 1.0  PROT 6.6 6.2*  ALBUMIN 3.5 3.2*   CBG: No results for input(s): GLUCAP in the last 168 hours.  Discharge time spent:  34 minutes.  Signed: Drue ONEIDA Potter, MD [facility] 01/15/2024

## 2024-01-16 ENCOUNTER — Telehealth: Payer: Self-pay | Admitting: *Deleted

## 2024-01-16 NOTE — Transitions of Care (Post Inpatient/ED Visit) (Signed)
   01/16/2024  Name: Devin White MRN: [id number] DOB: Oct 16, 1955  Today's TOC FU Call Status: Today's TOC FU Call Status:: Unsuccessful Call (1st Attempt) Unsuccessful Call (1st Attempt) Date: 01/16/24  Attempted to reach the patient regarding the most recent Inpatient/ED visit.  Follow Up Plan: Additional outreach attempts will be made to reach the patient to complete the Transitions of Care (Post Inpatient/ED visit) call.   Andrea Dimes RN, BSN [place]  [facility] RN Care Manager [phone]

## 2024-01-19 ENCOUNTER — Telehealth: Payer: Self-pay | Admitting: *Deleted

## 2024-01-19 NOTE — Transitions of Care (Post Inpatient/ED Visit) (Signed)
 01/19/2024  Name: Devin White MRN: [id number] DOB: 07/22/1955  Today's TOC FU Call Status: Today's TOC FU Call Status:: Successful TOC FU Call Completed TOC FU Call Complete Date: 01/19/24 Patient's Name and Date of Birth confirmed.  Transition Care Management Follow-up Telephone Call Date of Discharge: 01/15/24 Discharge Facility: [facility]) Name of Other (Non-[facility]) Discharge Facility: [facility] Type of Discharge: Inpatient Admission Primary Inpatient Discharge Diagnosis:: Severe dehydration How have you been since you were released from the hospital?: Better Any questions or concerns?: No  Items Reviewed: Did you receive and understand the discharge instructions provided?: Yes Medications obtained,verified, and reconciled?: Yes (Medications Reviewed) Any new allergies since your discharge?: No Dietary orders reviewed?: Yes Type of Diet Ordered:: Cardiac diet Do you have support at home?: No (Patient lives in a retirement community)  Medications Reviewed Today: Medications Reviewed Today     Reviewed by Lucky Andrea LABOR, RN (Registered Nurse) on 01/19/24 at 1534  Med List Status: <None>   Medication Order Taking? Sig Documenting Provider Last Dose Status Informant  ACCU-CHEK GUIDE TEST test strip [id number] Yes daily. [provider]  Active Self  Accu-Chek Softclix Lancets lancets [id number] Yes daily. [provider]  Active Self  acetaminophen  (TYLENOL ) 325 MG tablet [id number] Yes Take 650 mg by mouth every 6 (six) hours as needed for mild pain. [provider]  Active Self  aspirin  EC 81 MG tablet [id number] Yes Take 1 tablet (81 mg total) by mouth daily. Swallow whole. Josette Ade, MD  Active Self  Blood Glucose Monitoring Suppl DEVI [id number] Yes 1 each by Does not apply route in the morning, at noon, and at bedtime. May substitute to any manufacturer covered by patient's insurance. Patel, Sona, MD  Active Self   colchicine  0.6 MG tablet [id number] Yes Take 1 tablet (0.6 mg total) by mouth daily as needed. Jimmy Ade FERNS, MD  Active Self           Med Note CATHY OVAL DEL   Wed Jan 14, 2024  6:04 PM)    Continuous Glucose Sensor (FREESTYLE LIBRE 3 PLUS SENSOR) [place] [id number] Yes Change sensor every 15 days. Jimmy Ade FERNS, MD  Active Self  cyanocobalamin (VITAMIN B12) 500 MCG tablet [id number] Yes Take 500 mcg by mouth.  Take 1 tablet (500 mcg total) by mouth once daily [provider]  Active Self  diclofenac  Sodium (VOLTAREN ) 1 % GEL [id number] Yes APPLY 4 GRAMS TOPICALLY TO THE AFFECTED AREA FOUR TIMES DAILY Jimmy Ade FERNS, MD  Active Self  fludrocortisone  (FLORINEF ) 0.1 MG tablet [id number] Yes Take 1 tablet (0.1 mg total) by mouth daily. Jimmy Ade FERNS, MD  Active Self           Med Note (Harald Quevedo A   Mon Jan 19, 2024  3:22 PM)    fluticasone (FLONASE) 50 MCG/ACT nasal spray [id number] Yes Place 1 spray into both nostrils every 12 (twelve) hours. [provider]  Active Self  gabapentin  (NEURONTIN ) 100 MG capsule [id number] Yes Take 100 mg by mouth daily. [provider]  Active Self  insulin  glargine (LANTUS ) 100 UNIT/ML Solostar Pen [id number]  Inject 30 Units into the skin daily.  Patient not taking: Reported on 01/19/2024   Jimmy Ade FERNS, MD  Active Self  insulin  lispro (HUMALOG  [facility]) 100 UNIT/ML KwikPen [id number]  0-15 Units, Subcutaneous, 3 times daily before meals & bedtime  CBG 70 - 120: 0 units  CBG 121 - 150: 2 units  CBG 151 - 200: 3 units  CBG 201 - 250: 5 units  CBG 251 - 300: 8 units  CBG 301 - 350: 11 units  CBG 351 - 400: 15 units  CBG > 400: call MD  Patient not taking: Reported on 01/19/2024   Patel, Sona, MD  Active Self  Insulin  Pen Needle 32G X 4 MM MISC [id number]  1 each by Does not apply route as directed.  Patient not taking: Reported on 01/19/2024   Letvak, Richard I, MD  Active Self  lidocaine  4 % [id number]  Place 2  patches onto the skin. Place 2 patches onto the skin daily for 14 days Apply patch to the most painful area for up to 12 hours in a 24 hours period.  Patient not taking: Reported on 01/19/2024   [provider]  Active Self  magnesium  oxide (MAG-OX) 400 (240 Mg) MG tablet [id number] Yes Take 400 mg by mouth daily. [provider]  Active Self  methocarbamol  (ROBAXIN ) 500 MG tablet [id number]  Take by mouth 2 (two) times daily.  Patient not taking: Reported on 01/19/2024   [provider]  Active Self  metroNIDAZOLE  (FLAGYL ) 500 MG tablet [id number] Yes Take 1 tablet (500 mg total) by mouth 3 (three) times daily for 5 days. Dorinda Drue DASEN, MD  Active   nortriptyline  (PAMELOR ) 10 MG capsule [id number]  Take 10 mg by mouth 2 (two) times daily.  Patient not taking: Reported on 01/19/2024   [provider]  Active Self  omeprazole  (PRILOSEC) 20 MG capsule [id number] Yes TAKE 1 CAPSULE(20 MG) BY MOUTH DAILY Letvak, Richard I, MD  Active Self  ondansetron  (ZOFRAN ) 4 MG tablet [id number] Yes Take 1 tablet (4 mg total) by mouth 3 (three) times daily as needed for nausea or vomiting. Letvak, Richard I, MD  Active Self  potassium chloride  (KLOR-CON  M) 10 MEQ tablet [id number] Yes Take 10 mEq by mouth.  Take 1 tablet (10 mEq total) by mouth once daily as needed Take potassium ONLY IF you take the Lasix  [provider]  Active Self  predniSONE  (DELTASONE ) 2.5 MG tablet [id number]  Take by mouth. [provider]  Active Self  rosuvastatin  (CRESTOR ) 20 MG tablet [id number] Yes TAKE 1 TABLET(20 MG) BY MOUTH DAILY Letvak, Richard I, MD  Active Self  senna-docusate (SENOKOT-S) 8.6-50 MG tablet [id number]  Take 1 tablet by mouth 2 (two) times daily.  Patient not taking: Reported on 01/19/2024   Barbarann Nest, MD  Active Self  tacrolimus  (PROGRAF ) 0.5 MG capsule [id number] Yes Take 0.5-1 mg by mouth See admin instructions. Taking 0.5 mg in the morning and 1 mg at bedtime [provider]  Active Self  tamsulosin  (FLOMAX ) 0.4 MG CAPS capsule [id number] Yes TAKE 1 CAPSULE(0.4 MG) BY MOUTH DAILY Letvak, Richard I, MD  Active Self  trimethoprim  (TRIMPEX ) 100 MG tablet [id number]  Take 100 mg by mouth daily.  Patient not taking: Reported on 01/19/2024   [provider]  Active Self  verapamil (CALAN-SR) 120 MG CR tablet [id number] Yes Take 240 mg by mouth daily. [provider]  Active Self            Home Care and Equipment/Supplies: Were Home Health Services Ordered?: No Any new equipment or medical supplies ordered?: No  Functional Questionnaire: Do you need assistance with bathing/showering or dressing?: No Do you need assistance with meal preparation?: No Do you need assistance with eating?: No Do you have difficulty maintaining continence: No Do  you need assistance with getting out of bed/getting out of a chair/moving?: No Do you have difficulty managing or taking your medications?: No  Follow up appointments reviewed: PCP Follow-up appointment confirmed?: Yes Date of PCP follow-up appointment?: 01/26/24 Follow-up Provider: Dr. Jimmy Specialist [facility] Follow-up appointment confirmed?: Yes Date of Specialist follow-up appointment?: 01/22/24 Follow-Up Specialty Provider:: [facility] Do you need transportation to your follow-up appointment?: No (Granddaughter provides transportation) Do you understand care options if your condition(s) worsen?: Yes-patient verbalized understanding  SDOH Interventions Today    Flowsheet Row Most Recent Value  SDOH Interventions   Food Insecurity Interventions Intervention Not Indicated  Housing Interventions Intervention Not Indicated  Transportation Interventions Intervention Not Indicated  Utilities Interventions Intervention Not Indicated    Andrea Dimes RN, BSN [place]  Value[facility] Institute [facility] RN Care Manager [phone]

## 2024-01-22 DIAGNOSIS — I10 Essential (primary) hypertension: Secondary | ICD-10-CM | POA: Diagnosis not present

## 2024-01-22 DIAGNOSIS — I110 Hypertensive heart disease with heart failure: Secondary | ICD-10-CM | POA: Diagnosis not present

## 2024-01-22 DIAGNOSIS — R42 Dizziness and giddiness: Secondary | ICD-10-CM | POA: Diagnosis not present

## 2024-01-22 DIAGNOSIS — R55 Syncope and collapse: Secondary | ICD-10-CM | POA: Diagnosis not present

## 2024-01-26 ENCOUNTER — Ambulatory Visit: Admitting: Internal Medicine

## 2024-01-26 ENCOUNTER — Encounter: Payer: Self-pay | Admitting: Internal Medicine

## 2024-01-26 VITALS — BP 100/60 | HR 94 | Temp 97.9°F | Ht 73.0 in | Wt 253.0 lb

## 2024-01-26 DIAGNOSIS — A09 Infectious gastroenteritis and colitis, unspecified: Secondary | ICD-10-CM

## 2024-01-26 DIAGNOSIS — M1A9XX1 Chronic gout, unspecified, with tophus (tophi): Secondary | ICD-10-CM | POA: Diagnosis not present

## 2024-01-26 DIAGNOSIS — R42 Dizziness and giddiness: Secondary | ICD-10-CM

## 2024-01-26 DIAGNOSIS — I5032 Chronic diastolic (congestive) heart failure: Secondary | ICD-10-CM | POA: Diagnosis not present

## 2024-01-26 DIAGNOSIS — R55 Syncope and collapse: Secondary | ICD-10-CM

## 2024-01-26 MED ORDER — COLCHICINE 0.6 MG PO TABS: 0.6000 mg | ORAL_TABLET | Freq: Every day | ORAL | 1 refills | Status: AC | PRN

## 2024-01-26 NOTE — Progress Notes (Signed)
 Subjective:    Patient ID: Devin White, male    DOB: 01-24-1956, 68 y.o.   MRN: [id number]  HPI Here for hospital follow up  Admitted 7/29 due to diarrhea-- a week after discharge from [facility] Found to have enteropathogenic E coli--got azithromycin  and sent home on 5 days of metronidazole  Abdominal CT benign Home on 7/31  Did have urinary retention on ER presentation--foley placed. This is the main reason he went in Had successful voiding trial so home without catheter Creatinine was elevated---up to 2.69 Did come back to 1.51 before discharge  Was in [facility] for a week ending 7/23 after dizziness/syncope Amlodipine  stopped and changed to verapamil Had been slowly weaned off prednisone  prior to this--endocrine did not think he had adrenal insufficiency (or at most was very mild) Kept off prednisone --but now having terrible joint (knee/hands) swelling This is not the gout--as far as he can tell Had to reschedule with rheumatologist --but they are not available til February Ran out of colchicine  Hard to even walk due to the pain  Current Outpatient Medications on File Prior to Visit  Medication Sig Dispense Refill   ACCU-CHEK GUIDE TEST test strip daily.     Accu-Chek Softclix Lancets lancets daily.     acetaminophen  (TYLENOL ) 325 MG tablet Take 650 mg by mouth every 6 (six) hours as needed for mild pain.     aspirin  EC 81 MG tablet Take 1 tablet (81 mg total) by mouth daily. Swallow whole. 30 tablet 0   Blood Glucose Monitoring Suppl DEVI 1 each by Does not apply route in the morning, at noon, and at bedtime. May substitute to any manufacturer covered by patient's insurance. 1 each 0   colchicine  0.6 MG tablet Take 1 tablet (0.6 mg total) by mouth daily as needed. 30 tablet 1   Continuous Glucose Sensor (FREESTYLE LIBRE 3 PLUS SENSOR) MISC Change sensor every 15 days. 2 each 12   cyanocobalamin (VITAMIN B12) 500 MCG tablet Take 500 mcg by mouth.  Take 1 tablet (500 mcg total) by  mouth once daily     diclofenac  Sodium (VOLTAREN ) 1 % GEL APPLY 4 GRAMS TOPICALLY TO THE AFFECTED AREA FOUR TIMES DAILY 100 g 5   fludrocortisone  (FLORINEF ) 0.1 MG tablet Take 1 tablet (0.1 mg total) by mouth daily. 30 tablet 3   fluticasone (FLONASE) 50 MCG/ACT nasal spray Place 1 spray into both nostrils every 12 (twelve) hours.     gabapentin  (NEURONTIN ) 100 MG capsule Take 100 mg by mouth daily.     magnesium  oxide (MAG-OX) 400 (240 Mg) MG tablet Take 400 mg by mouth daily.     omeprazole  (PRILOSEC) 20 MG capsule TAKE 1 CAPSULE(20 MG) BY MOUTH DAILY 30 capsule 11   ondansetron  (ZOFRAN ) 4 MG tablet Take 1 tablet (4 mg total) by mouth 3 (three) times daily as needed for nausea or vomiting. 60 tablet 1   potassium chloride  (KLOR-CON  M) 10 MEQ tablet Take 10 mEq by mouth.  Take 1 tablet (10 mEq total) by mouth once daily as needed Take potassium ONLY IF you take the Lasix      rosuvastatin  (CRESTOR ) 20 MG tablet TAKE 1 TABLET(20 MG) BY MOUTH DAILY 90 tablet 3   senna-docusate (SENOKOT-S) 8.6-50 MG tablet Take 1 tablet by mouth 2 (two) times daily. (Patient taking differently: Take 1 tablet by mouth daily as needed.) 60 tablet 0   tacrolimus  (PROGRAF ) 0.5 MG capsule Take 0.5-1 mg by mouth See admin instructions. Taking 0.5 mg in  the morning and 1 mg at bedtime     tamsulosin  (FLOMAX ) 0.4 MG CAPS capsule TAKE 1 CAPSULE(0.4 MG) BY MOUTH DAILY 90 capsule 3   verapamil (CALAN-SR) 120 MG CR tablet Take 240 mg by mouth daily.     insulin  glargine (LANTUS ) 100 UNIT/ML Solostar Pen Inject 30 Units into the skin daily. (Patient not taking: Reported on 01/26/2024) 1 mL 0   insulin  lispro (HUMALOG  KWIKPEN) 100 UNIT/ML KwikPen 0-15 Units, Subcutaneous, 3 times daily before meals & bedtime  CBG 70 - 120: 0 units  CBG 121 - 150: 2 units  CBG 151 - 200: 3 units  CBG 201 - 250: 5 units  CBG 251 - 300: 8 units  CBG 301 - 350: 11 units  CBG 351 - 400: 15 units  CBG > 400: call MD (Patient not taking: Reported on  01/26/2024) 15 mL 11   Insulin  Pen Needle 32G X 4 MM MISC 1 each by Does not apply route as directed. (Patient not taking: Reported on 01/26/2024) 200 each 11   trimethoprim  (TRIMPEX ) 100 MG tablet Take 100 mg by mouth daily. (Patient not taking: Reported on 01/19/2024)     No current facility-administered medications on file prior to visit.    Allergies  Allergen Reactions   Levaquin  [Levofloxacin ] Shortness Of Breath    SOB and severe leg pain/weakness    Past Medical History:  Diagnosis Date   Arthritis    back and legs   Benign prostatic hypertrophy    CHF (congestive heart failure) (HCC)    GERD (gastroesophageal reflux disease)    Gout    History of blood transfusion    pt has antibodies in his blood since previous transfusions   History of cirrhosis of liver S/P TRANSPLANT 2009   History of liver failure S/P TRANSPLANT   Hypertension    Left ureteral calculus    Renal disorder    Stroke [facility])     Past Surgical History:  Procedure Laterality Date   APPENDECTOMY     bone morrow biopsy     CHOLECYSTECTOMY  2007   CYSTO/ LEFT RETROGRADE PYELOGRAM/ LEFT URETERAL STENT PLACEMENT  03-05-2012  DR ALVARO [facility])   LEFT URETERAL CALCULI   CYSTOSCOPY W/ URETERAL STENT PLACEMENT  03/11/2012   Procedure: CYSTOSCOPY WITH STENT REPLACEMENT;  Surgeon: Ricardo ALVARO, MD;  Location: [facility];  Service: Urology;  Laterality: Left;   HERNIA REPAIR  2008   INCISION AND DRAINAGE ABSCESS N/A 09/09/2023   Procedure: INCISION AND DRAINAGE, ABSCESS;  Surgeon: Jordis Laneta FALCON, MD;  Location: [facility];  Service: General;  Laterality: N/A;  Lithotomy   LIVER BIOPSY     LIVER TRANSPLANT  11/24/2007   pt states doing well since liver transplant   LUMBAR DISC SURGERY     LUMBAR FUSION     removal of fistula     URETEROSCOPY  03/11/2012   Procedure: URETEROSCOPY;  Surgeon: Ricardo ALVARO, MD;  Location: [facility];  Service: Urology;  Laterality: Left;   STONE  MANIPULATION, stone obtained  [phone] [employer] MCR    Family History  Problem Relation Age of Onset   Cancer Mother        colon   Arthritis Mother    Vasculitis Mother    Hypertension Mother    Cancer Father        colon   Kidney disease Father    Arthritis Father    Arthritis Brother    Alcohol  abuse Maternal Aunt    Diabetes Maternal Aunt    Alcohol abuse Maternal Uncle    Diabetes Paternal Aunt    Alcohol abuse Maternal Uncle    Alcohol abuse Maternal Uncle     Social History   Socioeconomic History   Marital status: Single    Spouse name: Not on file   Number of children: 3   Years of education: Not on file   Highest education level: 9th grade  Occupational History   Occupation: disabled    Employer: RETIRED  Tobacco Use   Smoking status: Never    Passive exposure: Past   Smokeless tobacco: Never  Vaping Use   Vaping status: Never Used  Substance and Sexual Activity   Alcohol use: No   Drug use: No   Sexual activity: Not on file  Other Topics Concern   Not on file  Social History Narrative   Has living will   Requests daughter Eleanor as his health care POA   Would accept brief attempt at resuscitation but no prolonged ventilation   No tube feeds if cognitively unaware   Social Drivers of Health   Financial Resource Strain: Low Risk  (01/01/2024)   Received from [facility]   Overall Financial Resource Strain (CARDIA)    Difficulty of Paying Living Expenses: Not hard at all  Food Insecurity: No Food Insecurity (01/19/2024)   Hunger Vital Sign    Worried About Running Out of Food in the Last Year: Never true    Ran Out of Food in the Last Year: Never true  Transportation Needs: No Transportation Needs (01/19/2024)   PRAPARE - Administrator, Civil Service (Medical): No    Lack of Transportation (Non-Medical): No  Physical Activity: Insufficiently Active (10/02/2023)   Exercise Vital Sign    Days of Exercise per Week: 2 days     Minutes of Exercise per Session: 20 min  Stress: No Stress Concern Present (10/02/2023)   [employer] of Occupational Health - Occupational Stress Questionnaire    Feeling of Stress : Not at all  Social Connections: Patient Declined (01/13/2024)   Social Connection and Isolation Panel    Frequency of Communication with Friends and Family: Patient declined    Frequency of Social Gatherings with Friends and Family: Patient declined    Attends Religious Services: Patient declined    Database administrator or Organizations: Patient declined    Attends Banker Meetings: Patient declined    Marital Status: Patient declined  Intimate Partner Violence: Not At Risk (01/19/2024)   Humiliation, Afraid, Rape, and Kick questionnaire    Fear of Current or Ex-Partner: No    Emotionally Abused: No    Physically Abused: No    Sexually Abused: No   Review of Systems Voiding fine now Diarrhea is resolved     Objective:   Physical Exam Constitutional:      Appearance: Normal appearance.  Cardiovascular:     Rate and Rhythm: Normal rate and regular rhythm.     Heart sounds: No murmur heard.    No gallop.  Pulmonary:     Effort: Pulmonary effort is normal.     Breath sounds: Normal breath sounds. No wheezing or rales.  Abdominal:     Palpations: Abdomen is soft.     Tenderness: There is no abdominal tenderness.  Musculoskeletal:     Cervical back: Neck supple.     Right lower leg: No edema.  Left lower leg: No edema.     Comments: Tenderness in some joints--especially knees  Lymphadenopathy:     Cervical: No cervical adenopathy.  Neurological:     Mental Status: He is alert.            Assessment & Plan:

## 2024-01-26 NOTE — Assessment & Plan Note (Signed)
 Still has the furosemide  for prn use

## 2024-01-26 NOTE — Assessment & Plan Note (Signed)
 Fluid retention, etc with fludrocortisone  Now off amlodipine  and on verapamil 120mg  daily now

## 2024-01-26 NOTE — Assessment & Plan Note (Signed)
 Has not tolerated being off prednisone  Will refill the colchicine  Refer him to rheumatology here at [facility] can't get in in [place] till next year

## 2024-01-26 NOTE — Assessment & Plan Note (Signed)
 From enteropathogenic E coli Symptoms resolved

## 2024-02-03 DIAGNOSIS — Z944 Liver transplant status: Secondary | ICD-10-CM | POA: Diagnosis not present

## 2024-02-03 DIAGNOSIS — M109 Gout, unspecified: Secondary | ICD-10-CM | POA: Diagnosis not present

## 2024-02-04 ENCOUNTER — Telehealth: Payer: Self-pay

## 2024-02-04 NOTE — Telephone Encounter (Signed)
 Copied from CRM [id number]. Topic: General - Other >> Feb 04, 2024  2:17 PM Pinkey ORN wrote: Reason for CRM: Mobile Scooter Request >> Feb 04, 2024  2:24 PM Pinkey ORN wrote: Devin White [facility] [phone] Patient called with [facility] on the line, requesting that an order be put in place for patient to receive a mobile scooter. Requesting that the order is sent to either [employer] - phone # [phone] or [employer] - Fax # [phone] / Phone # [phone]

## 2024-02-09 ENCOUNTER — Encounter: Payer: Self-pay | Admitting: *Deleted

## 2024-02-09 NOTE — Telephone Encounter (Signed)
 Prescription faxed to [facility] [employer] and received a fax confirmation.

## 2024-02-16 DIAGNOSIS — I509 Heart failure, unspecified: Secondary | ICD-10-CM | POA: Diagnosis not present

## 2024-02-16 DIAGNOSIS — R42 Dizziness and giddiness: Secondary | ICD-10-CM | POA: Diagnosis not present

## 2024-02-16 DIAGNOSIS — R079 Chest pain, unspecified: Secondary | ICD-10-CM | POA: Diagnosis not present

## 2024-02-16 DIAGNOSIS — R0602 Shortness of breath: Secondary | ICD-10-CM | POA: Diagnosis not present

## 2024-02-17 DIAGNOSIS — Z794 Long term (current) use of insulin: Secondary | ICD-10-CM | POA: Diagnosis not present

## 2024-02-17 DIAGNOSIS — R7989 Other specified abnormal findings of blood chemistry: Secondary | ICD-10-CM | POA: Diagnosis not present

## 2024-02-17 DIAGNOSIS — R0989 Other specified symptoms and signs involving the circulatory and respiratory systems: Secondary | ICD-10-CM | POA: Diagnosis not present

## 2024-02-17 DIAGNOSIS — K219 Gastro-esophageal reflux disease without esophagitis: Secondary | ICD-10-CM | POA: Diagnosis not present

## 2024-02-17 DIAGNOSIS — E1122 Type 2 diabetes mellitus with diabetic chronic kidney disease: Secondary | ICD-10-CM | POA: Diagnosis not present

## 2024-02-17 DIAGNOSIS — Z1211 Encounter for screening for malignant neoplasm of colon: Secondary | ICD-10-CM | POA: Diagnosis not present

## 2024-02-17 DIAGNOSIS — E538 Deficiency of other specified B group vitamins: Secondary | ICD-10-CM | POA: Diagnosis not present

## 2024-02-17 DIAGNOSIS — E1140 Type 2 diabetes mellitus with diabetic neuropathy, unspecified: Secondary | ICD-10-CM | POA: Diagnosis not present

## 2024-02-17 DIAGNOSIS — N1831 Chronic kidney disease, stage 3a: Secondary | ICD-10-CM | POA: Diagnosis not present

## 2024-02-17 DIAGNOSIS — Z8673 Personal history of transient ischemic attack (TIA), and cerebral infarction without residual deficits: Secondary | ICD-10-CM | POA: Diagnosis not present

## 2024-02-17 DIAGNOSIS — Z944 Liver transplant status: Secondary | ICD-10-CM | POA: Diagnosis not present

## 2024-03-01 DIAGNOSIS — Z8673 Personal history of transient ischemic attack (TIA), and cerebral infarction without residual deficits: Secondary | ICD-10-CM | POA: Diagnosis not present

## 2024-03-01 DIAGNOSIS — R7989 Other specified abnormal findings of blood chemistry: Secondary | ICD-10-CM | POA: Diagnosis not present

## 2024-03-01 DIAGNOSIS — D849 Immunodeficiency, unspecified: Secondary | ICD-10-CM | POA: Diagnosis not present

## 2024-03-01 DIAGNOSIS — E0840 Diabetes mellitus due to underlying condition with diabetic neuropathy, unspecified: Secondary | ICD-10-CM | POA: Diagnosis not present

## 2024-03-01 DIAGNOSIS — M1A9XX1 Chronic gout, unspecified, with tophus (tophi): Secondary | ICD-10-CM | POA: Diagnosis not present

## 2024-03-01 DIAGNOSIS — Z944 Liver transplant status: Secondary | ICD-10-CM | POA: Diagnosis not present

## 2024-03-01 DIAGNOSIS — Z794 Long term (current) use of insulin: Secondary | ICD-10-CM | POA: Diagnosis not present

## 2024-03-04 DIAGNOSIS — Z794 Long term (current) use of insulin: Secondary | ICD-10-CM | POA: Diagnosis not present

## 2024-03-04 DIAGNOSIS — Z944 Liver transplant status: Secondary | ICD-10-CM | POA: Diagnosis not present

## 2024-03-04 DIAGNOSIS — Z8673 Personal history of transient ischemic attack (TIA), and cerebral infarction without residual deficits: Secondary | ICD-10-CM | POA: Diagnosis not present

## 2024-03-04 DIAGNOSIS — E119 Type 2 diabetes mellitus without complications: Secondary | ICD-10-CM | POA: Diagnosis not present

## 2024-03-04 DIAGNOSIS — K219 Gastro-esophageal reflux disease without esophagitis: Secondary | ICD-10-CM | POA: Diagnosis not present

## 2024-03-04 DIAGNOSIS — Z01818 Encounter for other preprocedural examination: Secondary | ICD-10-CM | POA: Diagnosis not present

## 2024-03-04 DIAGNOSIS — N1831 Chronic kidney disease, stage 3a: Secondary | ICD-10-CM | POA: Diagnosis not present

## 2024-03-04 DIAGNOSIS — I10 Essential (primary) hypertension: Secondary | ICD-10-CM | POA: Diagnosis not present

## 2024-03-04 DIAGNOSIS — Z7984 Long term (current) use of oral hypoglycemic drugs: Secondary | ICD-10-CM | POA: Diagnosis not present

## 2024-03-05 DIAGNOSIS — I129 Hypertensive chronic kidney disease with stage 1 through stage 4 chronic kidney disease, or unspecified chronic kidney disease: Secondary | ICD-10-CM | POA: Diagnosis not present

## 2024-03-05 DIAGNOSIS — K31A15 Gastric intestinal metaplasia without dysplasia, involving multiple sites: Secondary | ICD-10-CM | POA: Diagnosis not present

## 2024-03-05 DIAGNOSIS — K5730 Diverticulosis of large intestine without perforation or abscess without bleeding: Secondary | ICD-10-CM | POA: Diagnosis not present

## 2024-03-05 DIAGNOSIS — D175 Benign lipomatous neoplasm of intra-abdominal organs: Secondary | ICD-10-CM | POA: Diagnosis not present

## 2024-03-05 DIAGNOSIS — K317 Polyp of stomach and duodenum: Secondary | ICD-10-CM | POA: Diagnosis not present

## 2024-03-05 DIAGNOSIS — D120 Benign neoplasm of cecum: Secondary | ICD-10-CM | POA: Diagnosis not present

## 2024-03-05 DIAGNOSIS — K2289 Other specified disease of esophagus: Secondary | ICD-10-CM | POA: Diagnosis not present

## 2024-03-05 DIAGNOSIS — Z860101 Personal history of adenomatous and serrated colon polyps: Secondary | ICD-10-CM | POA: Diagnosis not present

## 2024-03-05 DIAGNOSIS — Z79899 Other long term (current) drug therapy: Secondary | ICD-10-CM | POA: Diagnosis not present

## 2024-03-05 DIAGNOSIS — Q402 Other specified congenital malformations of stomach: Secondary | ICD-10-CM | POA: Diagnosis not present

## 2024-03-05 DIAGNOSIS — N189 Chronic kidney disease, unspecified: Secondary | ICD-10-CM | POA: Diagnosis not present

## 2024-03-05 DIAGNOSIS — I130 Hypertensive heart and chronic kidney disease with heart failure and stage 1 through stage 4 chronic kidney disease, or unspecified chronic kidney disease: Secondary | ICD-10-CM | POA: Diagnosis not present

## 2024-03-05 DIAGNOSIS — N1831 Chronic kidney disease, stage 3a: Secondary | ICD-10-CM | POA: Diagnosis not present

## 2024-03-05 DIAGNOSIS — Z1211 Encounter for screening for malignant neoplasm of colon: Secondary | ICD-10-CM | POA: Diagnosis not present

## 2024-03-05 DIAGNOSIS — Z7952 Long term (current) use of systemic steroids: Secondary | ICD-10-CM | POA: Diagnosis not present

## 2024-03-05 DIAGNOSIS — D122 Benign neoplasm of ascending colon: Secondary | ICD-10-CM | POA: Diagnosis not present

## 2024-03-05 DIAGNOSIS — K31A11 Gastric intestinal metaplasia without dysplasia, involving the antrum: Secondary | ICD-10-CM | POA: Diagnosis not present

## 2024-03-05 DIAGNOSIS — K648 Other hemorrhoids: Secondary | ICD-10-CM | POA: Diagnosis not present

## 2024-03-05 DIAGNOSIS — I5032 Chronic diastolic (congestive) heart failure: Secondary | ICD-10-CM | POA: Diagnosis not present

## 2024-03-05 DIAGNOSIS — K219 Gastro-esophageal reflux disease without esophagitis: Secondary | ICD-10-CM | POA: Diagnosis not present

## 2024-03-05 DIAGNOSIS — Z09 Encounter for follow-up examination after completed treatment for conditions other than malignant neoplasm: Secondary | ICD-10-CM | POA: Diagnosis not present

## 2024-03-05 DIAGNOSIS — K2950 Unspecified chronic gastritis without bleeding: Secondary | ICD-10-CM | POA: Diagnosis not present

## 2024-03-05 DIAGNOSIS — K3189 Other diseases of stomach and duodenum: Secondary | ICD-10-CM | POA: Diagnosis not present

## 2024-03-05 DIAGNOSIS — E1122 Type 2 diabetes mellitus with diabetic chronic kidney disease: Secondary | ICD-10-CM | POA: Diagnosis not present

## 2024-03-05 DIAGNOSIS — Z8673 Personal history of transient ischemic attack (TIA), and cerebral infarction without residual deficits: Secondary | ICD-10-CM | POA: Diagnosis not present

## 2024-03-05 DIAGNOSIS — Z7982 Long term (current) use of aspirin: Secondary | ICD-10-CM | POA: Diagnosis not present

## 2024-03-08 ENCOUNTER — Telehealth: Payer: Self-pay

## 2024-03-08 MED ORDER — ACCU-CHEK SOFTCLIX LANCETS MISC: 1.0000 | Freq: Every day | 2 refills | Status: AC

## 2024-03-08 NOTE — Telephone Encounter (Signed)
 Pt needs TOC visit soon with a provider in the office.

## 2024-03-08 NOTE — Telephone Encounter (Signed)
 Lvm to set TOC

## 2024-05-06 NOTE — Discharge Instructions (Signed)

## 2024-05-06 NOTE — Anesthesia Preprocedure Evaluation (Addendum)
 Anesthesia Evaluation  Patient identified by MRN, date of birth, ID band Patient awake    Reviewed: Allergy & Precautions, H&P , NPO status , Patient's Chart, lab work & pertinent test results  Airway Mallampati: IV  TM Distance: >3 FB Neck ROM: Full    Dental no notable dental hx. (+) Poor Dentition   Pulmonary neg pulmonary ROS, shortness of breath, sleep apnea    Pulmonary exam normal breath sounds clear to auscultation       Cardiovascular hypertension, +CHF  negative cardio ROS Normal cardiovascular exam Rhythm:Regular Rate:Normal  12-21-23 echo HYPERCONTRACTILE LEFT VENTRICULAR FUNCTION WITH SEVERE LVH  ESTIMATED EF: >70%  NORMAL LA PRESSURES WITH DIASTOLIC DYSFUNCTION (GRADE 1)  NORMAL RIGHT VENTRICULAR SYSTOLIC FUNCTION  VALVULAR REGURGITATION: No AR, TRIVIAL MR, TRIVIAL PR, TRIVIAL TR  ESTIMATED RVSP: 27 mmHg  NO VALVULAR STENOSIS     Neuro/Psych  Neuromuscular disease CVA negative neurological ROS  negative psych ROS   GI/Hepatic negative GI ROS, Neg liver ROS,GERD  ,,(+) Hepatitis -  Endo/Other  negative endocrine ROSdiabetes    Renal/GU Renal diseasenegative Renal ROS  negative genitourinary   Musculoskeletal negative musculoskeletal ROS (+) Arthritis ,    Abdominal   Peds negative pediatric ROS (+)  Hematology negative hematology ROS (+)   Anesthesia Other Findings SP orthotopic liver transplant 2009 History of superior mesenteric vein thrombus, was on 6 months of DOAC (ended 05/2021)  GERD (gastroesophageal reflux disease) Gout Benign prostatic hypertrophy Hypertension History of blood transfusion  History of cirrhosis of liver History of liver failure  Left ureteral calculus Arthritis  Renal disorder CHF (congestive heart failure) (HCC) Stroke (HCC) History of kidney stones  Liver transplant recipient [facility]) Chronic heart failure with preserved ejection fraction (HFpEF)   LVH (left  ventricular hypertrophy) due to hypertensive disease, with heart failure  Syncope, unspecified syncope type Shortness of breath Dizziness  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) History of DVT (deep vein thrombosis) Postural dizziness with presyncope Obesity (BMI 30-39.9)  Diabetes mellitus treated with insulin  and oral medication  Gastric intestinal metaplasia  Stage 3a chronic kidney disease  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  Red blood cell antibody positive, compatible PRBC difficult to obtain Acquired thrombophilia Immunosuppression due to drug therapy Chronic diastolic heart failure (HCC) Metabolic dysfunction-associated steatotic liver disease (MASLD) cirrhosis Superior mesenteric vein thrombosis Sleep apnea Grade I diastolic dysfunction     Reproductive/Obstetrics negative OB ROS                              Anesthesia Physical Anesthesia Plan  ASA: 4  Anesthesia Plan: MAC   Post-op Pain Management:    Induction: Intravenous  PONV Risk Score and Plan:   Airway Management Planned: Natural Airway and Nasal Cannula  Additional Equipment:   Intra-op Plan:   Post-operative Plan:   Informed Consent: I have reviewed the patients History and Physical, chart, labs and discussed the procedure including the risks, benefits and alternatives for the proposed anesthesia with the patient or authorized representative who has indicated his/her understanding and acceptance.     Dental Advisory Given  Plan Discussed with: Anesthesiologist, CRNA and Surgeon  Anesthesia Plan Comments: (Patient consented for risks of anesthesia including but not limited to:  - adverse reactions to medications - damage to eyes, teeth, lips or other oral mucosa - nerve damage due to positioning  - sore throat or hoarseness - Damage to heart,  brain, nerves, lungs, other parts of body or loss of life  Patient  voiced understanding and assent.)         Anesthesia Quick Evaluation

## 2024-05-07 ENCOUNTER — Encounter: Payer: Self-pay | Admitting: Ophthalmology

## 2024-05-11 ENCOUNTER — Ambulatory Visit
Admission: RE | Admit: 2024-05-11 | Discharge: 2024-05-11 | Disposition: A | Discharge status: Home / Self Care | Attending: Ophthalmology | Admitting: Ophthalmology

## 2024-05-11 ENCOUNTER — Ambulatory Visit: Payer: Self-pay | Admitting: Anesthesiology

## 2024-05-11 ENCOUNTER — Other Ambulatory Visit: Payer: Self-pay

## 2024-05-11 ENCOUNTER — Encounter: Payer: Self-pay | Admitting: Ophthalmology

## 2024-05-11 ENCOUNTER — Encounter
Admission: RE | Disposition: A | Discharge status: Home / Self Care | Payer: Self-pay | Source: Home / Self Care | Attending: Ophthalmology

## 2024-05-11 DIAGNOSIS — E1122 Type 2 diabetes mellitus with diabetic chronic kidney disease: Secondary | ICD-10-CM | POA: Diagnosis not present

## 2024-05-11 DIAGNOSIS — E1136 Type 2 diabetes mellitus with diabetic cataract: Secondary | ICD-10-CM | POA: Insufficient documentation

## 2024-05-11 DIAGNOSIS — G4730 Sleep apnea, unspecified: Secondary | ICD-10-CM | POA: Diagnosis not present

## 2024-05-11 DIAGNOSIS — N1831 Chronic kidney disease, stage 3a: Secondary | ICD-10-CM | POA: Insufficient documentation

## 2024-05-11 DIAGNOSIS — I130 Hypertensive heart and chronic kidney disease with heart failure and stage 1 through stage 4 chronic kidney disease, or unspecified chronic kidney disease: Secondary | ICD-10-CM | POA: Diagnosis not present

## 2024-05-11 DIAGNOSIS — H2512 Age-related nuclear cataract, left eye: Secondary | ICD-10-CM | POA: Insufficient documentation

## 2024-05-11 DIAGNOSIS — I5032 Chronic diastolic (congestive) heart failure: Secondary | ICD-10-CM | POA: Insufficient documentation

## 2024-05-11 HISTORY — DX: Other ill-defined heart diseases: I51.89

## 2024-05-11 HISTORY — DX: Immunodeficiency due to drugs: D84.821

## 2024-05-11 HISTORY — DX: Long term (current) use of oral hypoglycemic drugs: Z79.84

## 2024-05-11 HISTORY — DX: Autoimmune hepatitis: K75.4

## 2024-05-11 HISTORY — DX: Fatty (change of) liver, not elsewhere classified: K76.0

## 2024-05-11 HISTORY — DX: Gastric intestinal metaplasia, unspecified: K31.A0

## 2024-05-11 HISTORY — DX: Other specified abnormal immunological findings in serum: R76.89

## 2024-05-11 HISTORY — DX: Shortness of breath: R06.02

## 2024-05-11 HISTORY — DX: Sleep apnea, unspecified: G47.30

## 2024-05-11 HISTORY — DX: Chronic kidney disease, stage 3a: N18.31

## 2024-05-11 HISTORY — DX: Personal history of other venous thrombosis and embolism: Z86.718

## 2024-05-11 HISTORY — DX: Personal history of urinary calculi: Z87.442

## 2024-05-11 HISTORY — DX: Acute infarction of intestine, part and extent unspecified: K55.069

## 2024-05-11 HISTORY — DX: Dizziness and giddiness: R42

## 2024-05-11 HISTORY — DX: Chronic diastolic (congestive) heart failure: I50.32

## 2024-05-11 HISTORY — DX: Obesity, unspecified: E66.9

## 2024-05-11 HISTORY — DX: Other thrombophilia: D68.69

## 2024-05-11 HISTORY — DX: Hemiplegia, unspecified affecting left nondominant side: G81.94

## 2024-05-11 HISTORY — PX: CATARACT EXTRACTION W/PHACO: SHX586

## 2024-05-11 HISTORY — DX: Syncope and collapse: R55

## 2024-05-11 HISTORY — DX: Hypertensive heart disease with heart failure: I11.0

## 2024-05-11 LAB — GLUCOSE, CAPILLARY: Glucose-Capillary: 161 mg/dL — ABNORMAL HIGH (ref 70–99)

## 2024-05-11 SURGERY — PHACOEMULSIFICATION, CATARACT, WITH IOL INSERTION
Anesthesia: Monitor Anesthesia Care | Site: Eye | Laterality: Left

## 2024-05-11 MED ORDER — SIGHTPATH DOSE#1 BSS IO SOLN
INTRAOCULAR | Status: DC | PRN
  Administered 2024-05-11: 15 mL via INTRAOCULAR

## 2024-05-11 MED ORDER — EPINEPHRINE PF 1 MG/ML IJ SOLN
INTRAMUSCULAR | Status: DC | PRN
  Administered 2024-05-11: 42 mL via OPHTHALMIC

## 2024-05-11 MED ORDER — MOXIFLOXACIN HCL 0.5 % OP SOLN
OPHTHALMIC | Status: DC | PRN
Start: 2024-05-11 — End: 2024-05-11
  Administered 2024-05-11: .2 mL via OPHTHALMIC

## 2024-05-11 MED ORDER — FENTANYL CITRATE (PF) 100 MCG/2ML IJ SOLN
INTRAMUSCULAR | Status: AC
  Filled 2024-05-11: qty 2

## 2024-05-11 MED ORDER — LACTATED RINGERS IV SOLN: INTRAVENOUS | Status: DC

## 2024-05-11 MED ORDER — TETRACAINE HCL 0.5 % OP SOLN
1.0000 [drp] | OPHTHALMIC | Status: DC | PRN
  Administered 2024-05-11 (×3): 1 [drp] via OPHTHALMIC

## 2024-05-11 MED ORDER — SIGHTPATH DOSE#1 NA CHONDROIT SULF-NA HYALURON 40-17 MG/ML IO SOLN
INTRAOCULAR | Status: DC | PRN
  Administered 2024-05-11: 1 mL via INTRAOCULAR

## 2024-05-11 MED ORDER — MIDAZOLAM HCL 2 MG/2ML IJ SOLN
INTRAMUSCULAR | Status: AC
  Filled 2024-05-11: qty 2

## 2024-05-11 MED ORDER — PHENYLEPHRINE HCL 10 % OP SOLN
1.0000 [drp] | OPHTHALMIC | Status: AC
Start: 2024-05-11 — End: 2024-05-11
  Administered 2024-05-11 (×3): 1 [drp] via OPHTHALMIC

## 2024-05-11 MED ORDER — MIDAZOLAM HCL (PF) 2 MG/2ML IJ SOLN
INTRAMUSCULAR | Status: DC | PRN
  Administered 2024-05-11: 1 mg via INTRAVENOUS

## 2024-05-11 MED ORDER — CYCLOPENTOLATE HCL 2 % OP SOLN
1.0000 [drp] | OPHTHALMIC | Status: AC
  Administered 2024-05-11 (×3): 1 [drp] via OPHTHALMIC

## 2024-05-11 MED ORDER — BRIMONIDINE TARTRATE-TIMOLOL 0.2-0.5 % OP SOLN
OPHTHALMIC | Status: DC | PRN
Start: 2024-05-11 — End: 2024-05-11
  Administered 2024-05-11: 1 [drp] via OPHTHALMIC

## 2024-05-11 MED ORDER — LIDOCAINE HCL (PF) 2 % IJ SOLN
INTRAMUSCULAR | Status: DC | PRN
  Administered 2024-05-11: 2 mL

## 2024-05-11 MED ORDER — FENTANYL CITRATE (PF) 100 MCG/2ML IJ SOLN
INTRAMUSCULAR | Status: DC | PRN
  Administered 2024-05-11: 50 ug via INTRAVENOUS

## 2024-05-11 SURGICAL SUPPLY — 10 items
CANNULA ANT/CHMB 27G (MISCELLANEOUS) ×1 IMPLANT
CYSTOTOME ANGL RVRS SHRT 25G (CUTTER) ×1 IMPLANT
FEE CATARACT SUITE SIGHTPATH (MISCELLANEOUS) ×1 IMPLANT
GLOVE BIOGEL PI IND STRL 8 (GLOVE) ×1 IMPLANT
GLOVE SURG LX STRL 8.0 MICRO (GLOVE) ×1 IMPLANT
GLOVE SURG SYN 6.5 PF PI BL (GLOVE) ×1 IMPLANT
LENS IOL TECNIS EYHANCE 17.5 (Intraocular Lens) IMPLANT
NDL FILTER BLUNT 18X1 1/2 (NEEDLE) ×1 IMPLANT
RING MALYGIN (MISCELLANEOUS) IMPLANT
SYR 3ML LL SCALE MARK (SYRINGE) ×1 IMPLANT

## 2024-05-11 NOTE — Transfer of Care (Signed)
 Immediate Anesthesia Transfer of Care Note  Patient: Devin White  Procedure(s) Performed: PHACOEMULSIFICATION, CATARACT, WITH IOL INSERTION 7.14 00:46.1 (Left: Eye)  Patient Location: PACU  Anesthesia Type: MAC  Level of Consciousness: awake, alert  and patient cooperative  Airway and Oxygen  Therapy: Patient Spontanous Breathing and Patient connected to supplemental oxygen   Post-op Assessment: Post-op Vital signs reviewed, Patient's Cardiovascular Status Stable, Respiratory Function Stable, Patent Airway and No signs of Nausea or vomiting  Post-op Vital Signs: Reviewed and stable  Complications: No notable events documented.

## 2024-05-11 NOTE — Op Note (Signed)
 PREOPERATIVE DIAGNOSIS:  Nuclear sclerotic cataract of the left eye.   POSTOPERATIVE DIAGNOSIS:  Nuclear sclerotic cataract of the left eye.   OPERATIVE PROCEDURE:ORPROCALL@   SURGEON:  Devin Carmine, MD.   ANESTHESIA:  Anesthesiologist: Ola Donny BROCKS, MD CRNA: Jahoo, Sonia, CRNA  1.      Managed anesthesia care. 2.     0.24ml of Shugarcaine was instilled following the paracentesis   COMPLICATIONS:  Viscoelastic was used to raise the pupil margin.  A  Malyugin ring was placed as the pupil would not achieve sufficient pharmacologic dilation to undergo cataract extraction safely.( The ring was removed atraumatically following insertion of the IOL.)    TECHNIQUE:   Stop and chop   DESCRIPTION OF PROCEDURE:  The patient was examined and consented in the preoperative holding area where the aforementioned topical anesthesia was applied to the left eye and then brought back to the Operating Room where the left eye was prepped and draped in the usual sterile ophthalmic fashion and a lid speculum was placed. A paracentesis was created with the side port blade and the anterior chamber was filled with viscoelastic. A near clear corneal incision was performed with the steel keratome. A continuous curvilinear capsulorrhexis was performed with a cystotome followed by the capsulorrhexis forceps. Hydrodissection and hydrodelineation were carried out with BSS on a blunt cannula. The lens was removed in a stop and chop  technique and the remaining cortical material was removed with the irrigation-aspiration handpiece. The capsular bag was inflated with viscoelastic and the intraocular lens was placed in the capsular bag without complication. The remaining viscoelastic was removed from the eye with the irrigation-aspiration handpiece. The wounds were hydrated. The anterior chamber was flushed with BSS and the eye was inflated to physiologic pressure. 0.35ml Vigamox  was placed in the anterior chamber. The wounds  were found to be water  tight. The eye was dressed with Combigan . The patient was given protective glasses to wear throughout the day and a shield with which to sleep tonight. The patient was also given drops with which to begin a drop regimen today and will follow-up with me in one day. Implant Name Type Inv. Item Serial No. Manufacturer Lot No. LRB No. Used Action  LENS IOL TECNIS EYHANCE 17.5 - [id number] Intraocular Lens LENS IOL TECNIS EYHANCE 17.5 [id number] [employer]  Left 1 Implanted    Procedure(s): PHACOEMULSIFICATION, CATARACT, WITH IOL INSERTION 7.14 00:46.1 (Left)  Electronically signed: Elsie White 05/11/2024 11:20 AM

## 2024-05-11 NOTE — Anesthesia Postprocedure Evaluation (Signed)
 Anesthesia Post Note  Patient: Devin White  Procedure(s) Performed: PHACOEMULSIFICATION, CATARACT, WITH IOL INSERTION 7.14 00:46.1 (Left: Eye)  Patient location during evaluation: PACU Anesthesia Type: MAC Level of consciousness: awake and alert Pain management: pain level controlled Vital Signs Assessment: post-procedure vital signs reviewed and stable Respiratory status: spontaneous breathing, nonlabored ventilation, respiratory function stable and patient connected to nasal cannula oxygen  Cardiovascular status: stable and blood pressure returned to baseline Postop Assessment: no apparent nausea or vomiting Anesthetic complications: no   No notable events documented.   Last Vitals:  Vitals:   05/11/24 1121 05/11/24 1127  BP: 100/76 113/84  Pulse: 93 96  Resp: 17   Temp: (!) 35.9 C (!) 35.9 C  SpO2:  (!) 89%    Last Pain:  Vitals:   05/11/24 1127  PainSc: 0-No pain                 Elmire Amrein C Jaidah Lomax

## 2024-05-11 NOTE — H&P (Signed)
 [facility]   Primary Care Physician:  Lars Rea BROCKS, NP Ophthalmologist: Dr. Elsie Carmine  Pre-Procedure History & Physical: HPI:  Devin White is a 68 y.o. male here for cataract surgery.   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)    Diabetes mellitus treated with insulin  and oral medication (HCC)    Dizziness    Gastric intestinal metaplasia    GERD (gastroesophageal reflux disease)    Gout    Grade I diastolic dysfunction    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)    History of blood transfusion    pt has antibodies in his blood since previous transfusions   History of cirrhosis of liver S/P TRANSPLANT 2009   History of DVT (deep vein thrombosis)    History of kidney stones    History of liver failure S/P TRANSPLANT   Hypertension    Immunosuppression due to drug therapy    Left ureteral calculus    Liver transplant recipient [facility]) 2009   orthotopic   LVH (left ventricular hypertrophy) due to hypertensive disease, with heart failure (HCC)    Metabolic dysfunction-associated steatotic liver disease (MASLD) cirrhosis    S/P orthotopic liver transplant in 2009, complicated by atypical rejection vs de novo autoimmune hepatitis   Obesity (BMI 30-39.9)    Postural dizziness with presyncope    Red blood cell antibody positive, compatible PRBC difficult to obtain    Renal disorder    Shortness of breath    Sleep apnea    Stage 3a chronic kidney disease (CKD) (HCC)    Stroke (HCC)    Superior mesenteric vein thrombosis    History of superior mesenteric vein thrombus, was on 6 months of DOAC (ended 05/2021)   Syncope, unspecified  syncope type     Past Surgical History:  Procedure Laterality Date   APPENDECTOMY     bone morrow biopsy     CHOLECYSTECTOMY  2007   CYSTO/ LEFT RETROGRADE PYELOGRAM/ LEFT URETERAL STENT PLACEMENT  03-05-2012  DR ALVARO [facility])   LEFT URETERAL CALCULI   CYSTOSCOPY W/ URETERAL STENT PLACEMENT  03/11/2012   Procedure: CYSTOSCOPY WITH STENT REPLACEMENT;  Surgeon: Ricardo Alvaro, MD;  Location: [facility];  Service: Urology;  Laterality: Left;   HERNIA REPAIR  2008   INCISION AND DRAINAGE ABSCESS N/A 09/09/2023   Procedure: INCISION AND DRAINAGE, ABSCESS;  Surgeon: Jordis Laneta FALCON, MD;  Location: [facility];  Service: General;  Laterality: N/A;  Lithotomy   LIVER BIOPSY     LIVER TRANSPLANT  11/24/2007   pt states doing well since liver transplant   LUMBAR DISC SURGERY     LUMBAR FUSION     removal of fistula     URETEROSCOPY  03/11/2012   Procedure: URETEROSCOPY;  Surgeon: Ricardo Alvaro, MD;  Location: [facility];  Service: Urology;  Laterality: Left;   STONE MANIPULATION, stone obtained  [phone] [employer] MCR    Prior to Admission medications   Medication Sig Start Date End Date Taking? Authorizing Provider  acetaminophen  (TYLENOL ) 325 MG tablet Take 650 mg by mouth every 6 (six) hours as needed for mild  pain.   Yes [provider]  aspirin  EC 81 MG tablet Take 1 tablet (81 mg total) by mouth daily. Swallow whole. 11/12/22  Yes Wieting, Richard, MD  Calcium  Carbonate 500 MG CHEW Chew 500 mg by mouth as needed.   Yes [provider]  colchicine  0.6 MG tablet Take 1 tablet (0.6 mg total) by mouth daily as needed. 01/26/24  Yes Jimmy Charlie FERNS, MD  cyanocobalamin (VITAMIN B12) 500 MCG tablet Take 500 mcg by mouth.  Take 1 tablet (500 mcg total) by mouth once daily 01/08/24 01/07/25 Yes [provider]  diclofenac  Sodium (VOLTAREN ) 1 % GEL APPLY 4 GRAMS TOPICALLY TO THE AFFECTED AREA FOUR TIMES DAILY 08/25/23  Yes Letvak, Richard I, MD   fluticasone (FLONASE) 50 MCG/ACT nasal spray Place 1 spray into both nostrils every 12 (twelve) hours. 01/07/24 01/06/25 Yes [provider]  furosemide  (LASIX ) 40 MG tablet Take 40 mg by mouth as needed.   Yes [provider]  gabapentin  (NEURONTIN ) 100 MG capsule Take 100 mg by mouth daily.   Yes [provider]  Glucagon (GVOKE HYPOPEN 1-PACK) 1 MG/0.2ML SOAJ Inject 0.2 mLs into the skin as needed.   Yes [provider]  insulin  lispro (HUMALOG ) 100 UNIT/ML KwikPen Inject into the skin. ON HOLD CURRENTLY- NOT NEEDED   Yes [provider]  Multiple Vitamins-Minerals (MULTIVITAMIN WITH MINERALS) tablet Take 1 tablet by mouth daily.   Yes [provider]  omeprazole  (PRILOSEC) 20 MG capsule TAKE 1 CAPSULE(20 MG) BY MOUTH DAILY 12/05/23  Yes Letvak, Richard I, MD  potassium chloride  (KLOR-CON  M) 10 MEQ tablet Take 10 mEq by mouth.  Take 1 tablet (10 mEq total) by mouth once daily as needed Take potassium ONLY IF you take the Lasix  01/07/24 01/06/25 Yes [provider]  prednisoLONE 5 MG TABS tablet Take 5 mg by mouth daily.   Yes [provider]  rosuvastatin  (CRESTOR ) 20 MG tablet TAKE 1 TABLET(20 MG) BY MOUTH DAILY 12/08/23  Yes Letvak, Richard I, MD  tacrolimus  (PROGRAF ) 0.5 MG capsule Take 0.5-1 mg by mouth See admin instructions. Taking 0.5 mg in the morning and 1 mg at bedtime   Yes [provider]  tamsulosin  (FLOMAX ) 0.4 MG CAPS capsule TAKE 1 CAPSULE(0.4 MG) BY MOUTH DAILY 11/11/23  Yes Letvak, Richard I, MD  verapamil (CALAN-SR) 120 MG CR tablet Take 120 mg by mouth daily. 01/07/24 01/06/25 Yes [provider]  ACCU-CHEK GUIDE TEST test strip daily. 12/18/23   [provider]  Accu-Chek Softclix Lancets lancets 1 each by Other route daily. 03/08/24   Webb, Padonda B, FNP  Blood Glucose Monitoring Suppl DEVI 1 each by Does not apply route in the morning, at noon, and at bedtime. May substitute to any  manufacturer covered by patient's insurance. 04/21/23   Patel, Sona, MD  Continuous Glucose Sensor (FREESTYLE LIBRE 3 PLUS SENSOR) MISC Change sensor every 15 days. 05/01/23   Jimmy Charlie FERNS, MD  magnesium  oxide (MAG-OX) 400 (240 Mg) MG tablet Take 400 mg by mouth daily. Patient taking differently: Take 400 mg by mouth as needed.    [provider]  ondansetron  (ZOFRAN ) 4 MG tablet Take 1 tablet (4 mg total) by mouth 3 (three) times daily as needed for nausea or vomiting. 10/08/23   Jimmy Charlie FERNS, MD  senna-docusate (SENOKOT-S) 8.6-50 MG tablet Take 1 tablet by mouth 2 (two) times daily. Patient taking differently: Take 1 tablet by mouth daily as needed. 09/14/23  Barbarann Nest, MD    Allergies as of 04/26/2024 - Review Complete 01/26/2024  Allergen Reaction Noted   Levaquin  [levofloxacin ] Shortness Of Breath 11/27/2022    Family History  Problem Relation Age of Onset   Cancer Mother        colon   Arthritis Mother    Vasculitis Mother    Hypertension Mother    Cancer Father        colon   Kidney disease Father    Arthritis Father    Arthritis Brother    Alcohol abuse Maternal Aunt    Diabetes Maternal Aunt    Alcohol abuse Maternal Uncle    Diabetes Paternal Aunt    Alcohol abuse Maternal Uncle    Alcohol abuse Maternal Uncle     Social History   Socioeconomic History   Marital status: Single    Spouse name: Not on file   Number of children: 3   Years of education: Not on file   Highest education level: 9th grade  Occupational History   Occupation: disabled    Employer: RETIRED  Tobacco Use   Smoking status: Never    Passive exposure: Past   Smokeless tobacco: Never  Vaping Use   Vaping status: Never Used  Substance and Sexual Activity   Alcohol use: No   Drug use: No   Sexual activity: Not on file  Other Topics Concern   Not on file  Social History Narrative   Has living will   Requests daughter Eleanor as his health care POA   Would  accept brief attempt at resuscitation but no prolonged ventilation   No tube feeds if cognitively unaware   Social Drivers of Health   Financial Resource Strain: Low Risk  (02/14/2024)   Received from [facility]   Overall Financial Resource Strain (CARDIA)    Difficulty of Paying Living Expenses: Not hard at all  Food Insecurity: No Food Insecurity (02/14/2024)   Received from [facility]   Hunger Vital Sign    Within the past 12 months, you worried that your food would run out before you got the money to buy more.: Never true    Within the past 12 months, the food you bought just didn't last and you didn't have money to get more.: Never true  Transportation Needs: No Transportation Needs (02/14/2024)   Received from [facility] - Transportation    In the past 12 months, has lack of transportation kept you from medical appointments or from getting medications?: No    Lack of Transportation (Non-Medical): No  Physical Activity: Insufficiently Active (10/02/2023)   Exercise Vital Sign    Days of Exercise per Week: 2 days    Minutes of Exercise per Session: 20 min  Stress: No Stress Concern Present (10/02/2023)   [employer] - Occupational Stress Questionnaire    Feeling of Stress : Not at all  Social Connections: Patient Declined (01/13/2024)   Social Connection and Isolation Panel    Frequency of Communication with Friends and Family: Patient declined    Frequency of Social Gatherings with Friends and Family: Patient declined    Attends Religious Services: Patient declined    Active Member of Clubs or Organizations: Patient declined    Attends Banker Meetings: Patient declined    Marital Status: Patient declined  Intimate Partner Violence: Not At Risk (01/19/2024)   Humiliation, Afraid, Rape, and Kick questionnaire  Fear of Current or Ex-Partner: No    Emotionally Abused: No     Physically Abused: No    Sexually Abused: No    Review of Systems: See HPI, otherwise negative ROS  Physical Exam: BP 109/80   Pulse (!) 101   Temp 98.2 F (36.8 C)   Resp 18   Ht 6' 1 (1.854 m)   Wt 114.3 kg   SpO2 95%   BMI 33.25 kg/m  General:   Alert, cooperative. Head:  Normocephalic and atraumatic. Respiratory:  Normal work of breathing. Cardiovascular:  NAD  Impression/Plan: Devin White is here for cataract surgery.  Risks, benefits, limitations, and alternatives regarding cataract surgery have been reviewed with the patient.  Questions have been answered.  All parties agreeable.   Elsie Carmine, MD  05/11/2024, 10:56 AM

## 2024-05-12 NOTE — Anesthesia Preprocedure Evaluation (Addendum)
 Anesthesia Evaluation  Patient identified by MRN, date of birth, ID band Patient awake    Reviewed: Allergy & Precautions, H&P , NPO status , Patient's Chart, lab work & pertinent test results  Airway Mallampati: IV  TM Distance: >3 FB Neck ROM: Full    Dental no notable dental hx. (+) Poor Dentition   Pulmonary shortness of breath, sleep apnea    Pulmonary exam normal breath sounds clear to auscultation       Cardiovascular hypertension, +CHF  Normal cardiovascular exam Rhythm:Regular Rate:Normal  12-21-23 echo HYPERCONTRACTILE LEFT VENTRICULAR FUNCTION WITH SEVERE LVH  ESTIMATED EF: >70%  NORMAL LA PRESSURES WITH DIASTOLIC DYSFUNCTION (GRADE 1)  NORMAL RIGHT VENTRICULAR SYSTOLIC FUNCTION  VALVULAR REGURGITATION: No AR, TRIVIAL MR, TRIVIAL PR, TRIVIAL TR  ESTIMATED RVSP: 27 mmHg  NO VALVULAR STENOSIS       Neuro/Psych  Neuromuscular disease CVA  negative psych ROS   GI/Hepatic ,GERD  ,,(+) Hepatitis -  Endo/Other  diabetes    Renal/GU Renal disease  negative genitourinary   Musculoskeletal  (+) Arthritis ,    Abdominal  (+) + obese  Peds negative pediatric ROS (+)  Hematology negative hematology ROS (+)   Anesthesia Other Findings Previous cataract 05-11-24   SP orthotopic liver transplant 2009 History of superior mesenteric vein thrombus, was on 6 months of DOAC (ended 05/2021)   GERD (gastroesophageal reflux disease)Gout Benign prostatic hypertrophyHypertension History of blood transfusion    History of cirrhosis of liver History of liver failure   Left ureteral calculus Arthritis             Renal disorder CHF (congestive heart failure) (HCC)Stroke (HCC) History of kidney stones          Liver transplant recipient [facility]) Chronic heart failure with preserved ejection fraction (HFpEF)          LVH (left ventricular hypertrophy) due to hypertensive disease, with heart failure  Syncope, unspecified  syncope typeShortness of breath Dizziness             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) History of DVT (deep vein thrombosis)Postural dizziness with presyncope Obesity (BMI 30-39.9)             Diabetes mellitus treated with insulin  and oral medication  Gastric intestinal metaplasia   Stage 3a chronic kidney disease  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  Red blood cell antibody positive, compatible PRBC difficult to obtain Acquired thrombophiliaImmunosuppression due to drug therapy Chronic diastolic heart failure (HCC)Metabolic dysfunction-associated steatotic liver disease (MASLD) cirrhosis Superior mesenteric vein thrombosisSleep apnea Grade I diastolic dysfunction          Reproductive/Obstetrics negative OB ROS                              Anesthesia Physical Anesthesia Plan  ASA: 3  Anesthesia Plan: MAC   Post-op Pain Management:    Induction: Intravenous  PONV Risk Score and Plan:   Airway Management Planned: Natural Airway and Nasal Cannula  Additional Equipment:   Intra-op Plan:   Post-operative Plan:   Informed Consent: I have reviewed the patients History and Physical, chart, labs and discussed the procedure including the risks, benefits and alternatives for the proposed anesthesia with the patient or authorized representative who has indicated his/her understanding and acceptance.     Dental Advisory Given  Plan Discussed with: Anesthesiologist, CRNA and Surgeon  Anesthesia Plan Comments:  Anesthesia Quick Evaluation

## 2024-05-18 NOTE — Discharge Instructions (Signed)

## 2024-05-25 ENCOUNTER — Other Ambulatory Visit: Payer: Self-pay

## 2024-05-25 ENCOUNTER — Encounter
Admission: RE | Disposition: A | Discharge status: Home / Self Care | Payer: Self-pay | Source: Home / Self Care | Attending: Ophthalmology

## 2024-05-25 ENCOUNTER — Ambulatory Visit: Payer: Self-pay | Admitting: Anesthesiology

## 2024-05-25 ENCOUNTER — Ambulatory Visit
Admission: RE | Admit: 2024-05-25 | Discharge: 2024-05-25 | Disposition: A | Attending: Ophthalmology | Admitting: Ophthalmology

## 2024-05-25 ENCOUNTER — Encounter: Payer: Self-pay | Admitting: Ophthalmology

## 2024-05-25 HISTORY — PX: CATARACT EXTRACTION W/PHACO: SHX586

## 2024-05-25 LAB — GLUCOSE, CAPILLARY: Glucose-Capillary: 169 mg/dL — ABNORMAL HIGH (ref 70–99)

## 2024-05-25 SURGERY — PHACOEMULSIFICATION, CATARACT, WITH IOL INSERTION
Anesthesia: Topical | Site: Eye | Laterality: Right

## 2024-05-25 MED ORDER — PHENYLEPHRINE HCL 10 % OP SOLN
OPHTHALMIC | Status: AC
  Filled 2024-05-25: qty 5

## 2024-05-25 MED ORDER — MIDAZOLAM HCL 2 MG/2ML IJ SOLN
INTRAMUSCULAR | Status: AC
  Filled 2024-05-25: qty 2

## 2024-05-25 MED ORDER — PHENYLEPHRINE HCL 10 % OP SOLN
1.0000 [drp] | OPHTHALMIC | Status: AC
  Administered 2024-05-25 (×3): 1 [drp] via OPHTHALMIC

## 2024-05-25 MED ORDER — SIGHTPATH DOSE#1 NA CHONDROIT SULF-NA HYALURON 40-17 MG/ML IO SOLN
INTRAOCULAR | Status: DC | PRN
  Administered 2024-05-25: 1 mL via INTRAOCULAR

## 2024-05-25 MED ORDER — MIDAZOLAM HCL 5 MG/5ML IJ SOLN
INTRAMUSCULAR | Status: DC | PRN
  Administered 2024-05-25: 2 mg via INTRAVENOUS

## 2024-05-25 MED ORDER — MOXIFLOXACIN HCL 0.5 % OP SOLN
OPHTHALMIC | Status: DC | PRN
  Administered 2024-05-25: .2 mL via OPHTHALMIC

## 2024-05-25 MED ORDER — TETRACAINE HCL 0.5 % OP SOLN
OPHTHALMIC | Status: AC
  Filled 2024-05-25: qty 8

## 2024-05-25 MED ORDER — BRIMONIDINE TARTRATE-TIMOLOL 0.2-0.5 % OP SOLN
OPHTHALMIC | Status: DC | PRN
  Administered 2024-05-25: 1 [drp] via OPHTHALMIC

## 2024-05-25 MED ORDER — LIDOCAINE HCL (PF) 2 % IJ SOLN
INTRAOCULAR | Status: DC | PRN
  Administered 2024-05-25: 2 mL

## 2024-05-25 MED ORDER — FENTANYL CITRATE (PF) 100 MCG/2ML IJ SOLN
INTRAMUSCULAR | Status: AC
  Filled 2024-05-25: qty 2

## 2024-05-25 MED ORDER — CYCLOPENTOLATE HCL 2 % OP SOLN
1.0000 [drp] | OPHTHALMIC | Status: AC
  Administered 2024-05-25 (×3): 1 [drp] via OPHTHALMIC

## 2024-05-25 MED ORDER — TETRACAINE HCL 0.5 % OP SOLN
1.0000 [drp] | OPHTHALMIC | Status: DC | PRN
  Administered 2024-05-25 (×3): 1 [drp] via OPHTHALMIC

## 2024-05-25 MED ORDER — SIGHTPATH DOSE#1 BSS IO SOLN
INTRAOCULAR | Status: DC | PRN
  Administered 2024-05-25: 38 mL via OPHTHALMIC

## 2024-05-25 MED ORDER — CYCLOPENTOLATE HCL 2 % OP SOLN
OPHTHALMIC | Status: AC
  Filled 2024-05-25: qty 2

## 2024-05-25 MED ORDER — FENTANYL CITRATE (PF) 100 MCG/2ML IJ SOLN
INTRAMUSCULAR | Status: DC | PRN
  Administered 2024-05-25: 100 ug via INTRAVENOUS

## 2024-05-25 MED ORDER — SIGHTPATH DOSE#1 BSS IO SOLN
INTRAOCULAR | Status: DC | PRN
  Administered 2024-05-25: 15 mL via INTRAOCULAR

## 2024-05-25 SURGICAL SUPPLY — 10 items
CANNULA ANT/CHMB 27G (MISCELLANEOUS) ×1 IMPLANT
CYSTOTOME ANGL RVRS SHRT 25G (CUTTER) ×1 IMPLANT
FEE CATARACT SUITE SIGHTPATH (MISCELLANEOUS) ×1 IMPLANT
GLOVE BIOGEL PI IND STRL 8 (GLOVE) ×1 IMPLANT
GLOVE SURG LX STRL 8.0 MICRO (GLOVE) ×1 IMPLANT
GLOVE SURG SYN 6.5 PF PI BL (GLOVE) ×1 IMPLANT
LENS IOL TECNIS EYHANCE 19.0 (Intraocular Lens) IMPLANT
NDL FILTER BLUNT 18X1 1/2 (NEEDLE) ×1 IMPLANT
RING MALYGIN (MISCELLANEOUS) IMPLANT
SYR 3ML LL SCALE MARK (SYRINGE) ×1 IMPLANT

## 2024-05-25 NOTE — Anesthesia Postprocedure Evaluation (Signed)
 Anesthesia Post Note  Patient: TRELON PLUSH  Procedure(s) Performed: PHACOEMULSIFICATION, CATARACT, WITH IOL INSERTION 6.46 00:41.4 (Right: Eye)  Patient location during evaluation: PACU Anesthesia Type: MAC Level of consciousness: awake and alert Pain management: pain level controlled Vital Signs Assessment: post-procedure vital signs reviewed and stable Respiratory status: spontaneous breathing, nonlabored ventilation and respiratory function stable Cardiovascular status: stable and blood pressure returned to baseline Postop Assessment: no apparent nausea or vomiting Anesthetic complications: no   No notable events documented.   Last Vitals:  Vitals:   05/25/24 0926 05/25/24 0930  BP: 122/86 (!) 124/90  Pulse: 78   Resp: 13   Temp: 37.1 C 37.1 C  SpO2: 93%     Last Pain:  Vitals:   05/25/24 0930  TempSrc:   PainSc: 0-No pain                 Camellia Merilee Louder

## 2024-05-25 NOTE — Op Note (Signed)
 PREOPERATIVE DIAGNOSIS:  Nuclear sclerotic cataract of the right eye.   POSTOPERATIVE DIAGNOSIS:  Right Eye Cataract   OPERATIVE PROCEDURE:ORPROCALL@   SURGEON:  Elsie Carmine, MD.   ANESTHESIA:  Anesthesiologist: Vicci Camellia Glatter, MD CRNA: Niki Manus SAUNDERS, CRNA  1.      Managed anesthesia care. 2.      0.2ml of Shugarcaine was instilled in the eye following the paracentesis.   COMPLICATIONS: Viscoelastic was used to raise the pupil margin.  A  Malyugin ring was placed as the pupil would not achieve sufficient pharmacologic dilation to undergo cataract extraction safely.( The ring was removed atraumatically following insertion of the IOL.)    TECHNIQUE:   Stop and chop   DESCRIPTION OF PROCEDURE:  The patient was examined and consented in the preoperative holding area where the aforementioned topical anesthesia was applied to the right eye and then brought back to the Operating Room where the right eye was prepped and draped in the usual sterile ophthalmic fashion and a lid speculum was placed. A paracentesis was created with the side port blade and the anterior chamber was filled with viscoelastic. A near clear corneal incision was performed with the steel keratome. A continuous curvilinear capsulorrhexis was performed with a cystotome followed by the capsulorrhexis forceps. Hydrodissection and hydrodelineation were carried out with BSS on a blunt cannula. The lens was removed in a stop and chop  technique and the remaining cortical material was removed with the irrigation-aspiration handpiece. The capsular bag was inflated with viscoelastic and the intraocular lens was placed in the capsular bag without complication. The remaining viscoelastic was removed from the eye with the irrigation-aspiration handpiece. The wounds were hydrated. The anterior chamber was flushed with BSS and the eye was inflated to physiologic pressure. 0.45ml of Vigamox  was placed in the anterior chamber. The  wounds were found to be water  tight. The eye was dressed with Combigan . The patient was given protective glasses to wear throughout the day and a shield with which to sleep tonight. The patient was also given drops with which to begin a drop regimen today and will follow-up with me in one day. Implant Name Type Inv. Item Serial No. Manufacturer Lot No. LRB No. Used Action  LENS IOL TECNIS EYHANCE 19.0 - [id number] Intraocular Lens LENS IOL TECNIS EYHANCE 19.0 [id number] [employer]  Right 1 Implanted   Procedure(s): PHACOEMULSIFICATION, CATARACT, WITH IOL INSERTION 6.46 00:41.4 (Right)  Electronically signed: Elsie Carmine 05/25/2024 9:25 AM

## 2024-05-25 NOTE — H&P (Signed)
 [facility]   Primary Care Physician:  Lars Rea BROCKS, NP Ophthalmologist: Dr. Elsie Carmine  Pre-Procedure History & Physical: HPI:  Devin White is a 68 y.o. male here for cataract surgery.   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)    Diabetes mellitus treated with insulin  and oral medication (HCC)    Dizziness    Gastric intestinal metaplasia    GERD (gastroesophageal reflux disease)    Gout    Grade I diastolic dysfunction    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)    History of blood transfusion    pt has antibodies in his blood since previous transfusions   History of cirrhosis of liver S/P TRANSPLANT 2009   History of DVT (deep vein thrombosis)    History of kidney stones    History of liver failure S/P TRANSPLANT   Hypertension    Immunosuppression due to drug therapy    Left ureteral calculus    Liver transplant recipient [facility]) 2009   orthotopic   LVH (left ventricular hypertrophy) due to hypertensive disease, with heart failure (HCC)    Metabolic dysfunction-associated steatotic liver disease (MASLD) cirrhosis    S/P orthotopic liver transplant in 2009, complicated by atypical rejection vs de novo autoimmune hepatitis   Obesity (BMI 30-39.9)    Postural dizziness with presyncope    Red blood cell antibody positive, compatible PRBC difficult to obtain    Renal disorder    Shortness of breath    Sleep apnea    Stage 3a chronic kidney disease (CKD) (HCC)    Stroke (HCC)    Superior mesenteric vein thrombosis    History of superior mesenteric vein thrombus, was on 6 months of DOAC (ended 05/2021)   Syncope, unspecified  syncope type     Past Surgical History:  Procedure Laterality Date   APPENDECTOMY     bone morrow biopsy     CATARACT EXTRACTION W/PHACO Left 05/11/2024   Procedure: PHACOEMULSIFICATION, CATARACT, WITH IOL INSERTION 7.14 00:46.1;  Surgeon: Carmine Elsie, MD;  Location: [facility];  Service: Ophthalmology;  Laterality: Left;   CHOLECYSTECTOMY  2007   CYSTO/ LEFT RETROGRADE PYELOGRAM/ LEFT URETERAL STENT PLACEMENT  03-05-2012  DR ALVARO [facility])   LEFT URETERAL CALCULI   CYSTOSCOPY W/ URETERAL STENT PLACEMENT  03/11/2012   Procedure: CYSTOSCOPY WITH STENT REPLACEMENT;  Surgeon: Ricardo Alvaro, MD;  Location: [facility];  Service: Urology;  Laterality: Left;   HERNIA REPAIR  2008   INCISION AND DRAINAGE ABSCESS N/A 09/09/2023   Procedure: INCISION AND DRAINAGE, ABSCESS;  Surgeon: Jordis Laneta FALCON, MD;  Location: [facility];  Service: General;  Laterality: N/A;  Lithotomy   LIVER BIOPSY     LIVER TRANSPLANT  11/24/2007   pt states doing well since liver transplant   LUMBAR DISC SURGERY     LUMBAR FUSION     removal of fistula     URETEROSCOPY  03/11/2012   Procedure: URETEROSCOPY;  Surgeon: Ricardo Alvaro, MD;  Location: [facility];  Service: Urology;  Laterality: Left;   STONE MANIPULATION, stone obtained  [phone] [facility]    Prior  to Admission medications   Medication Sig Start Date End Date Taking? Authorizing Provider  ACCU-CHEK GUIDE TEST test strip daily. 12/18/23  Yes [provider]  Accu-Chek Softclix Lancets lancets 1 each by Other route daily. 03/08/24  Yes Webb, Padonda B, FNP  acetaminophen  (TYLENOL ) 325 MG tablet Take 650 mg by mouth every 6 (six) hours as needed for mild pain.   Yes [provider]  aspirin  EC 81 MG tablet Take 1 tablet (81 mg total) by mouth daily. Swallow whole. 11/12/22  Yes Wieting, Richard, MD  Blood Glucose Monitoring Suppl DEVI 1 each by Does not apply route in the morning, at noon, and at bedtime.  May substitute to any manufacturer covered by patient's insurance. 04/21/23  Yes Patel, Sona, MD  Calcium  Carbonate 500 MG CHEW Chew 500 mg by mouth as needed.   Yes [provider]  colchicine  0.6 MG tablet Take 1 tablet (0.6 mg total) by mouth daily as needed. 01/26/24  Yes Jimmy Charlie FERNS, MD  Continuous Glucose Sensor (FREESTYLE LIBRE 3 PLUS SENSOR) MISC Change sensor every 15 days. 05/01/23  Yes Jimmy Charlie FERNS, MD  cyanocobalamin (VITAMIN B12) 500 MCG tablet Take 500 mcg by mouth.  Take 1 tablet (500 mcg total) by mouth once daily 01/08/24 01/07/25 Yes [provider]  diclofenac  Sodium (VOLTAREN ) 1 % GEL APPLY 4 GRAMS TOPICALLY TO THE AFFECTED AREA FOUR TIMES DAILY 08/25/23  Yes Letvak, Richard I, MD  fluticasone (FLONASE) 50 MCG/ACT nasal spray Place 1 spray into both nostrils every 12 (twelve) hours. 01/07/24 01/06/25 Yes [provider]  furosemide  (LASIX ) 40 MG tablet Take 40 mg by mouth as needed.   Yes [provider]  gabapentin  (NEURONTIN ) 100 MG capsule Take 100 mg by mouth daily.   Yes [provider]  insulin  lispro (HUMALOG ) 100 UNIT/ML KwikPen Inject into the skin. ON HOLD CURRENTLY- NOT NEEDED   Yes [provider]  Multiple Vitamins-Minerals (MULTIVITAMIN WITH MINERALS) tablet Take 1 tablet by mouth daily.   Yes [provider]  omeprazole  (PRILOSEC) 20 MG capsule TAKE 1 CAPSULE(20 MG) BY MOUTH DAILY 12/05/23  Yes Letvak, Richard I, MD  ondansetron  (ZOFRAN ) 4 MG tablet Take 1 tablet (4 mg total) by mouth 3 (three) times daily as needed for nausea or vomiting. 10/08/23  Yes Jimmy Charlie FERNS, MD  potassium chloride  (KLOR-CON  M) 10 MEQ tablet Take 10 mEq by mouth.  Take 1 tablet (10 mEq total) by mouth once daily as needed Take potassium ONLY IF you take the Lasix  01/07/24 01/06/25 Yes [provider]  prednisoLONE 5 MG TABS tablet Take 5 mg by mouth daily.   Yes [provider]  rosuvastatin  (CRESTOR ) 20 MG  tablet TAKE 1 TABLET(20 MG) BY MOUTH DAILY 12/08/23  Yes Letvak, Richard I, MD  senna-docusate (SENOKOT-S) 8.6-50 MG tablet Take 1 tablet by mouth 2 (two) times daily. Patient taking differently: Take 1 tablet by mouth daily as needed. 09/14/23  Yes Barbarann Nest, MD  tacrolimus  (PROGRAF ) 0.5 MG capsule Take 0.5-1 mg by mouth See admin instructions. Taking 0.5 mg in the morning and 1 mg at bedtime   Yes [provider]  tamsulosin  (FLOMAX ) 0.4 MG CAPS capsule TAKE 1 CAPSULE(0.4 MG) BY MOUTH DAILY 11/11/23  Yes Letvak, Richard I, MD  verapamil (CALAN-SR) 120 MG CR tablet Take 120 mg by mouth daily. 01/07/24 01/06/25 Yes [provider]  Glucagon (GVOKE HYPOPEN 1-PACK) 1 MG/0.2ML SOAJ Inject 0.2 mLs into the skin as needed.  [provider]  magnesium  oxide (MAG-OX) 400 (240 Mg) MG tablet Take 400 mg by mouth daily. Patient taking differently: Take 400 mg by mouth as needed.    [provider]    Allergies as of 04/26/2024 - Review Complete 01/26/2024  Allergen Reaction Noted   Levaquin  [levofloxacin ] Shortness Of Breath 11/27/2022    Family History  Problem Relation Age of Onset   Cancer Mother        colon   Arthritis Mother    Vasculitis Mother    Hypertension Mother    Cancer Father        colon   Kidney disease Father    Arthritis Father    Arthritis Brother    Alcohol abuse Maternal Aunt    Diabetes Maternal Aunt    Alcohol abuse Maternal Uncle    Diabetes Paternal Aunt    Alcohol abuse Maternal Uncle    Alcohol abuse Maternal Uncle     Social History   Socioeconomic History   Marital status: Single    Spouse name: Not on file   Number of children: 3   Years of education: Not on file   Highest education level: 9th grade  Occupational History   Occupation: disabled    Employer: RETIRED  Tobacco Use   Smoking status: Never    Passive exposure: Past   Smokeless tobacco: Never  Vaping Use   Vaping status: Never Used  Substance  and Sexual Activity   Alcohol use: No   Drug use: No   Sexual activity: Not on file  Other Topics Concern   Not on file  Social History Narrative   Has living will   Requests daughter Eleanor as his health care POA   Would accept brief attempt at resuscitation but no prolonged ventilation   No tube feeds if cognitively unaware   Social Drivers of Health   Financial Resource Strain: Low Risk  (02/14/2024)   Received from [facility]   Overall Financial Resource Strain (CARDIA)    Difficulty of Paying Living Expenses: Not hard at all  Food Insecurity: No Food Insecurity (02/14/2024)   Received from [facility]   Hunger Vital Sign    Within the past 12 months, you worried that your food would run out before you got the money to buy more.: Never true    Within the past 12 months, the food you bought just didn't last and you didn't have money to get more.: Never true  Transportation Needs: No Transportation Needs (02/14/2024)   Received from [facility] - Transportation    In the past 12 months, has lack of transportation kept you from medical appointments or from getting medications?: No    Lack of Transportation (Non-Medical): No  Physical Activity: Insufficiently Active (10/02/2023)   Exercise Vital Sign    Days of Exercise per Week: 2 days    Minutes of Exercise per Session: 20 min  Stress: No Stress Concern Present (10/02/2023)   [employer] - Occupational Stress Questionnaire    Feeling of Stress : Not at all  Social Connections: Patient Declined (01/13/2024)   Social Connection and Isolation Panel    Frequency of Communication with Friends and Family: Patient declined    Frequency of Social Gatherings with Friends and Family: Patient declined    Attends Religious Services: Patient declined    Active Member of Clubs or Organizations: Patient declined    Attends  Club or Organization  Meetings: Patient declined    Marital Status: Patient declined  Intimate Partner Violence: Not At Risk (01/19/2024)   Humiliation, Afraid, Rape, and Kick questionnaire    Fear of Current or Ex-Partner: No    Emotionally Abused: No    Physically Abused: No    Sexually Abused: No    Review of Systems: See HPI, otherwise negative ROS  Physical Exam: BP (!) 138/97   Pulse 82   Temp 98.2 F (36.8 C) (Temporal)   Resp 20   Ht 6' 1 (1.854 m)   Wt 117.9 kg   SpO2 95%   BMI 34.30 kg/m  General:   Alert, cooperative. Head:  Normocephalic and atraumatic. Respiratory:  Normal work of breathing. Cardiovascular:  NAD  Impression/Plan: Devin White is here for cataract surgery.  Risks, benefits, limitations, and alternatives regarding cataract surgery have been reviewed with the patient.  Questions have been answered.  All parties agreeable.   Elsie Carmine, MD  05/25/2024, 8:59 AM

## 2024-05-25 NOTE — Transfer of Care (Signed)
 Immediate Anesthesia Transfer of Care Note  Patient: Devin White  Procedure(s) Performed: PHACOEMULSIFICATION, CATARACT, WITH IOL INSERTION 6.46 00:41.4 (Right: Eye)  Patient Location: PACU  Anesthesia Type: MAC  Level of Consciousness: awake, alert  and patient cooperative  Airway and Oxygen  Therapy: Patient Spontanous Breathing and Patient connected to supplemental oxygen   Post-op Assessment: Post-op Vital signs reviewed, Patient's Cardiovascular Status Stable, Respiratory Function Stable, Patent Airway and No signs of Nausea or vomiting  Post-op Vital Signs: Reviewed and stable  Complications: No notable events documented.
